# Patient Record
Sex: Female | Born: 1955
Health system: Southern US, Community
[De-identification: ages and names within clinical notes are randomized; demographics above are authoritative.]

## PROBLEM LIST (undated history)

## (undated) DIAGNOSIS — I509 Heart failure, unspecified: Secondary | ICD-10-CM

## (undated) DIAGNOSIS — E669 Obesity, unspecified: Secondary | ICD-10-CM

## (undated) DIAGNOSIS — F329 Major depressive disorder, single episode, unspecified: Secondary | ICD-10-CM

## (undated) DIAGNOSIS — R6520 Severe sepsis without septic shock: Secondary | ICD-10-CM

## (undated) DIAGNOSIS — K5731 Diverticulosis of large intestine without perforation or abscess with bleeding: Secondary | ICD-10-CM

## (undated) DIAGNOSIS — A419 Sepsis, unspecified organism: Secondary | ICD-10-CM

## (undated) DIAGNOSIS — C50919 Malignant neoplasm of unspecified site of unspecified female breast: Secondary | ICD-10-CM

## (undated) DIAGNOSIS — J449 Chronic obstructive pulmonary disease, unspecified: Secondary | ICD-10-CM

## (undated) DIAGNOSIS — Z9889 Other specified postprocedural states: Secondary | ICD-10-CM

## (undated) DIAGNOSIS — J45909 Unspecified asthma, uncomplicated: Secondary | ICD-10-CM

## (undated) DIAGNOSIS — E119 Type 2 diabetes mellitus without complications: Secondary | ICD-10-CM

## (undated) DIAGNOSIS — I10 Essential (primary) hypertension: Secondary | ICD-10-CM

## (undated) DIAGNOSIS — G4730 Sleep apnea, unspecified: Secondary | ICD-10-CM

## (undated) DIAGNOSIS — K5792 Diverticulitis of intestine, part unspecified, without perforation or abscess without bleeding: Secondary | ICD-10-CM

## (undated) DIAGNOSIS — B399 Histoplasmosis, unspecified: Secondary | ICD-10-CM

## (undated) DIAGNOSIS — E875 Hyperkalemia: Secondary | ICD-10-CM

## (undated) DIAGNOSIS — Z720 Tobacco use: Secondary | ICD-10-CM

## (undated) DIAGNOSIS — R112 Nausea with vomiting, unspecified: Secondary | ICD-10-CM

## (undated) DIAGNOSIS — F32A Depression, unspecified: Secondary | ICD-10-CM

## (undated) DIAGNOSIS — J9601 Acute respiratory failure with hypoxia: Secondary | ICD-10-CM

## (undated) DIAGNOSIS — K922 Gastrointestinal hemorrhage, unspecified: Secondary | ICD-10-CM

## (undated) DIAGNOSIS — R0602 Shortness of breath: Secondary | ICD-10-CM

## (undated) DIAGNOSIS — J189 Pneumonia, unspecified organism: Secondary | ICD-10-CM

## (undated) DIAGNOSIS — Z8489 Family history of other specified conditions: Secondary | ICD-10-CM

## (undated) DIAGNOSIS — D649 Anemia, unspecified: Secondary | ICD-10-CM

## (undated) DIAGNOSIS — J984 Other disorders of lung: Secondary | ICD-10-CM

## (undated) DIAGNOSIS — K429 Umbilical hernia without obstruction or gangrene: Secondary | ICD-10-CM

## (undated) HISTORY — DX: Severe sepsis without septic shock: A41.9

## (undated) HISTORY — DX: Gastrointestinal hemorrhage, unspecified: K92.2

## (undated) HISTORY — DX: Acute respiratory failure with hypoxia: J96.01

## (undated) HISTORY — DX: Diverticulitis of intestine, part unspecified, without perforation or abscess without bleeding: K57.92

## (undated) HISTORY — DX: Diverticulosis of large intestine without perforation or abscess with bleeding: K57.31

## (undated) HISTORY — PX: EYE SURGERY: SHX253

## (undated) HISTORY — DX: Heart failure, unspecified: I50.9

## (undated) HISTORY — DX: Umbilical hernia without obstruction or gangrene: K42.9

## (undated) HISTORY — DX: Unspecified asthma, uncomplicated: J45.909

## (undated) HISTORY — DX: Severe sepsis without septic shock: R65.20

---

## 2000-02-16 ENCOUNTER — Encounter: Admission: RE | Admit: 2000-02-16 | Discharge: 2000-02-16 | Payer: Self-pay | Admitting: Family Medicine

## 2000-02-16 ENCOUNTER — Encounter: Payer: Self-pay | Admitting: Family Medicine

## 2000-02-23 ENCOUNTER — Encounter: Payer: Self-pay | Admitting: Family Medicine

## 2000-02-23 ENCOUNTER — Encounter: Admission: RE | Admit: 2000-02-23 | Discharge: 2000-02-23 | Payer: Self-pay | Admitting: Family Medicine

## 2000-02-24 ENCOUNTER — Other Ambulatory Visit: Admission: RE | Admit: 2000-02-24 | Discharge: 2000-02-24 | Payer: Self-pay | Admitting: Family Medicine

## 2000-02-28 ENCOUNTER — Encounter: Payer: Self-pay | Admitting: Family Medicine

## 2000-02-28 ENCOUNTER — Encounter: Admission: RE | Admit: 2000-02-28 | Discharge: 2000-02-28 | Payer: Self-pay | Admitting: Family Medicine

## 2000-02-28 ENCOUNTER — Other Ambulatory Visit: Admission: RE | Admit: 2000-02-28 | Discharge: 2000-02-28 | Payer: Self-pay | Admitting: Family Medicine

## 2006-02-03 ENCOUNTER — Emergency Department (HOSPITAL_COMMUNITY): Admission: EM | Admit: 2006-02-03 | Discharge: 2006-02-03 | Payer: Self-pay | Admitting: Emergency Medicine

## 2010-10-04 ENCOUNTER — Emergency Department (HOSPITAL_COMMUNITY)
Admission: EM | Admit: 2010-10-04 | Discharge: 2010-10-05 | Disposition: A | Discharge status: Home / Self Care | Payer: Self-pay | Attending: Emergency Medicine | Admitting: Emergency Medicine

## 2010-10-04 DIAGNOSIS — K429 Umbilical hernia without obstruction or gangrene: Secondary | ICD-10-CM | POA: Insufficient documentation

## 2010-10-04 DIAGNOSIS — F17200 Nicotine dependence, unspecified, uncomplicated: Secondary | ICD-10-CM | POA: Insufficient documentation

## 2010-10-04 DIAGNOSIS — R1033 Periumbilical pain: Secondary | ICD-10-CM | POA: Insufficient documentation

## 2010-10-05 ENCOUNTER — Emergency Department (HOSPITAL_COMMUNITY): Payer: Self-pay

## 2010-10-05 LAB — CBC
HCT: 47.5 % — ABNORMAL HIGH (ref 36.0–46.0)
Hemoglobin: 15.9 g/dL — ABNORMAL HIGH (ref 12.0–15.0)
MCH: 30.4 pg (ref 26.0–34.0)
MCHC: 33.5 g/dL (ref 30.0–36.0)
MCV: 90.8 fL (ref 78.0–100.0)
Platelets: 256 10*3/uL (ref 150–400)
RBC: 5.23 MIL/uL — ABNORMAL HIGH (ref 3.87–5.11)
RDW: 13.7 % (ref 11.5–15.5)
WBC: 12.7 10*3/uL — ABNORMAL HIGH (ref 4.0–10.5)

## 2010-10-05 LAB — URINALYSIS, ROUTINE W REFLEX MICROSCOPIC
Bilirubin Urine: NEGATIVE
Glucose, UA: NEGATIVE mg/dL
Hgb urine dipstick: NEGATIVE
Ketones, ur: NEGATIVE mg/dL
Nitrite: NEGATIVE
Protein, ur: NEGATIVE mg/dL
Specific Gravity, Urine: 1.008 (ref 1.005–1.030)
Urobilinogen, UA: 0.2 mg/dL (ref 0.0–1.0)
pH: 6 (ref 5.0–8.0)

## 2010-10-05 LAB — COMPREHENSIVE METABOLIC PANEL
ALT: 24 U/L (ref 0–35)
AST: 16 U/L (ref 0–37)
Albumin: 3.2 g/dL — ABNORMAL LOW (ref 3.5–5.2)
Alkaline Phosphatase: 67 U/L (ref 39–117)
BUN: 7 mg/dL (ref 6–23)
CO2: 29 mEq/L (ref 19–32)
Calcium: 9 mg/dL (ref 8.4–10.5)
Chloride: 105 mEq/L (ref 96–112)
Creatinine, Ser: 0.71 mg/dL (ref 0.4–1.2)
GFR calc Af Amer: >60 mL/min (ref >60)
GFR calc non Af Amer: >60 mL/min (ref >60)
Glucose, Bld: 104 mg/dL — ABNORMAL HIGH (ref 70–99)
Potassium: 4 mEq/L (ref 3.5–5.1)
Sodium: 142 mEq/L (ref 135–145)
Total Bilirubin: 0.3 mg/dL (ref 0.3–1.2)
Total Protein: 7.3 g/dL (ref 6.0–8.3)

## 2010-10-05 LAB — DIFFERENTIAL
Basophils Absolute: 0.1 10*3/uL (ref 0.0–0.1)
Basophils Relative: 1 % (ref 0–1)
Eosinophils Absolute: 0.2 10*3/uL (ref 0.0–0.7)
Eosinophils Relative: 1 % (ref 0–5)
Lymphocytes Relative: 29 % (ref 12–46)
Lymphs Abs: 3.7 10*3/uL (ref 0.7–4.0)
Monocytes Absolute: 0.7 10*3/uL (ref 0.1–1.0)
Monocytes Relative: 5 % (ref 3–12)
Neutro Abs: 8.1 10*3/uL — ABNORMAL HIGH (ref 1.7–7.7)
Neutrophils Relative %: 64 % (ref 43–77)

## 2010-10-05 MED ORDER — IOHEXOL 300 MG/ML  SOLN: 100.0000 mL | Freq: Once | INTRAMUSCULAR | Status: DC | PRN

## 2012-12-04 ENCOUNTER — Encounter: Payer: Self-pay | Admitting: Family Medicine

## 2012-12-04 ENCOUNTER — Other Ambulatory Visit: Payer: Self-pay | Admitting: Family Medicine

## 2012-12-04 ENCOUNTER — Ambulatory Visit (INDEPENDENT_AMBULATORY_CARE_PROVIDER_SITE_OTHER): Payer: BC Managed Care – PPO | Admitting: Family Medicine

## 2012-12-04 VITALS — BP 140/92 | HR 114 | Temp 98.0°F | Ht 62.75 in | Wt 227.0 lb

## 2012-12-04 DIAGNOSIS — Z720 Tobacco use: Secondary | ICD-10-CM

## 2012-12-04 DIAGNOSIS — Z136 Encounter for screening for cardiovascular disorders: Secondary | ICD-10-CM

## 2012-12-04 DIAGNOSIS — Z0000 Encounter for general adult medical examination without abnormal findings: Secondary | ICD-10-CM

## 2012-12-04 DIAGNOSIS — K429 Umbilical hernia without obstruction or gangrene: Secondary | ICD-10-CM

## 2012-12-04 NOTE — Progress Notes (Signed)
  Subjective:    Patient ID: Deborah [name], female    DOB: [birth year], 57 y.o.   MRN: [id number]  HPI  Very pleasant 57 yo female with long history of tobacco abuse here to establish care and to discuss hernia.  Umbilical hernia- very large, first noticed it 4 years ago and has been growing in size.  Did not have insurance until recently.  Has not had any preventative care since 1999. Recently, she feels her hernia "burning" when she coughs.  Burning resolves shortly afterwards.  No constant pain.  Patient Active Problem List   Diagnosis Date Noted  . Umbilical hernia    Past Medical History  Diagnosis Date  . Umbilical hernia    No past surgical history on file. History  Substance Use Topics  . Smoking status: Current Every Day Smoker  . Smokeless tobacco: Not on file  . Alcohol Use: Not on file   No family history on file. No Known Allergies No current outpatient prescriptions on file prior to visit.   No current facility-administered medications on file prior to visit.   The PMH, PSH, Social History, Family History, Medications, and allergies have been reviewed in [facility], and have been updated if relevant.   Review of Systems See HPI No fever No nausea, vomiting or changes in bowel habits    Objective:   Physical Exam  Constitutional: She appears well-developed and well-nourished. No distress.  HENT:  Mouth/Throat: Abnormal dentition.  Abdominal: Bowel sounds are normal. There is no tenderness.    Neurological: She is alert.  Psychiatric: She has a normal mood and affect. Her speech is normal and behavior is normal. Judgment and thought content normal. Cognition and memory are normal.   BP 140/92  Pulse 114  Temp(Src) 98 F (36.7 C)  Ht 5' 2.75" (1.594 m)  Wt 227 lb (102.967 kg)  BMI 40.52 kg/m2  SpO2 91%        Assessment & Plan:  1. Umbilical/ventral hernia Growing in size and now symptomatic.  Will refer to general surgery for further  evaluation. The patient indicates understanding of these issues and agrees with the plan.  - Ambulatory referral to General Surgery

## 2012-12-04 NOTE — Patient Instructions (Signed)
At your convenience, please schedule an appointment for CPX.  Please stop by to see Bonita Quin on your way out to set up your surgical referral.

## 2012-12-06 ENCOUNTER — Encounter (INDEPENDENT_AMBULATORY_CARE_PROVIDER_SITE_OTHER): Payer: Self-pay | Admitting: General Surgery

## 2012-12-06 ENCOUNTER — Telehealth: Payer: Self-pay

## 2012-12-06 ENCOUNTER — Ambulatory Visit (INDEPENDENT_AMBULATORY_CARE_PROVIDER_SITE_OTHER): Payer: BC Managed Care – PPO | Admitting: General Surgery

## 2012-12-06 VITALS — BP 160/98 | HR 102 | Temp 97.6°F | Ht 62.0 in | Wt 226.0 lb

## 2012-12-06 DIAGNOSIS — K439 Ventral hernia without obstruction or gangrene: Secondary | ICD-10-CM

## 2012-12-06 MED ORDER — VARENICLINE TARTRATE 0.5 MG X 11 & 1 MG X 42 PO MISC: ORAL | Status: DC

## 2012-12-06 NOTE — Telephone Encounter (Signed)
Rx sent for chantix.

## 2012-12-06 NOTE — Telephone Encounter (Signed)
Pt left v/m; pt saw Dr Biagio Quint and pt is to get med to help pt stop smoking before having surgery.[facility] [address]. Pt said OK to get rx sent to pharmacy next week.

## 2012-12-06 NOTE — Progress Notes (Signed)
Patient ID: Deborah [name], female   DOB: Apr 29, 1956, 57 y.o.   MRN: [id number]  Chief Complaint  Patient presents with  . New Evaluation    eval hernia    HPI Deborah [name] is a 57 y.o. female. This patient is referred by Dr. Dayton Martes for evaluation of of a possible umbilical hernia repair.  She says that she has noticed this bulge for at least 4 years but she did not have insurance and he really has not been uncomfortable for her except for old and occasional episodes. It does not reduce.  She says that over the last few months she's having more discomfort in the area of him becoming more constant. She describes this as a "burning and pulling and cramping pain" which only lasted a few minutes and then spontaneously resolved. She denies any obstructive symptoms and says that her bowels are normal although she does get occasional bloating but no vomiting. She has never had a colonoscopy. She is a heavy smoker and was smoking 2 packs a day of until recently over the last 18 months she is headed down to about one pack per day. She does have shortness of breath with regular daily activities. She denies any heart problems but really hasn't had any previous evaluation as well. HPI  Past Medical History  Diagnosis Date  . Umbilical hernia   . Asthma     Past Surgical History  Procedure Laterality Date  . Cesarean section  04/12/85    Family History  Problem Relation Age of Onset  . Heart attack Mother     Social History History  Substance Use Topics  . Smoking status: Current Every Day Smoker -- 1.00 packs/day    Types: Cigarettes  . Smokeless tobacco: Not on file  . Alcohol Use: Yes     Comment: 1 month    No Known Allergies  Current Outpatient Prescriptions  Medication Sig Dispense Refill  . Aspirin-Caffeine (BC FAST PAIN RELIEF ARTHRITIS PO) Take 4 by mouth daily      . cetirizine (ZYRTEC) 10 MG tablet Take 10 mg by mouth daily.      . Olopatadine HCl (PATADAY) 0.2 % SOLN One  drop in each eye twice a day       No current facility-administered medications for this visit.    Review of Systems Review of Systems All other review of systems negative or noncontributory except as stated in the HPI  Blood pressure 160/98, pulse 102, temperature 97.6 F (36.4 C), temperature source Temporal, height 5\' 2"  (1.575 m), weight 226 lb (102.513 kg), SpO2 91.00%.  Physical Exam Physical Exam Physical Exam  Nursing note and vitals reviewed. Constitutional: She is oriented to person, place, and time. She appears well-developed and well-nourished. No distress.  HENT:  Head: Normocephalic and atraumatic.  Mouth/Throat: No oropharyngeal exudate.  Eyes: Conjunctivae and EOM are normal. Pupils are equal, round, and reactive to light. Right eye exhibits no discharge. Left eye exhibits no discharge. No scleral icterus.  Neck: Normal range of motion. Neck supple. No tracheal deviation present.  Cardiovascular: Normal rate, regular rhythm, normal heart sounds and intact distal pulses.   Pulmonary/Chest: Effort normal and breath sounds with insp and exp wheezing. No stridor. No respiratory distress.  Abdominal: Soft. Bowel sounds are normal. She exhibits no distension and no mass. There is no tenderness. There is no rebound and no guarding. She has a large, irreducible umbilical hernia which is nontender.  Musculoskeletal: Normal range of motion. She  exhibits no edema and no tenderness.  Neurological: She is alert and oriented to person, place, and time.  Skin: Skin is warm and dry. No rash noted. She is not diaphoretic. No erythema. No pallor.  Psychiatric: She has a normal mood and affect. Her behavior is normal. Judgment and thought content normal.    Data Reviewed CT from 2012  Assessment    Large, incarcerated umbilical hernia She has a fairly large an incarcerated umbilical hernia which at least on CT scan from 2012 contains fat. She's having some mild discomfort from this  hernia but no obstructive symptoms. I think that she would benefit from repair of his hernia and I discussed with her the options of continued observation versus laparoscopic versus open ventral hernia repair with mesh. I think that probably an open repair would be best for her given the size of this defect, however, given her obesity and heavy smoking I cannot really recommend this at this time. I explained that her heavy smoking and obesity are probably the greatest risk factors for complications perioperatively not including her baseline pulmonary issues which would increase her risk for pulmonary complications. We also discussed laparoscopic repair which might be lower risk from a wound infection in competition from the hernia standpoint, however, not sure that we would be able to completely reduce this bulge and she would have persistent bulging likely in the area of and she was not interested in having any persistent bulging. For the difficult situation because she certainly needs ventral hernia repair, but at this time, I think surgery is a very high-risk for her. I recommended smoking cessation and explained that if she was able to be completely smoke-free for one month, that I would be willing to fix this hernia for her. She is going to discussed this with her primary physician and discuss medication options to help with her smoking cessation and she will contact me in  another month or 2 and the me know how she is doing with he smoking cessation.    Plan    Weight loss and smoking cessation and she will followup in about 2 months. She would also need cardiac clearance and clearance from her primary physician prior to surgery.       Lodema Pilot DAVID 12/06/2012, 10:35 AM

## 2012-12-06 NOTE — Telephone Encounter (Signed)
Medicine called to [facility], left message on voice mail advising patient.

## 2012-12-13 ENCOUNTER — Other Ambulatory Visit (INDEPENDENT_AMBULATORY_CARE_PROVIDER_SITE_OTHER): Payer: BC Managed Care – PPO

## 2012-12-13 DIAGNOSIS — Z0000 Encounter for general adult medical examination without abnormal findings: Secondary | ICD-10-CM

## 2012-12-13 DIAGNOSIS — Z136 Encounter for screening for cardiovascular disorders: Secondary | ICD-10-CM

## 2012-12-13 LAB — COMPREHENSIVE METABOLIC PANEL
ALT: 21 U/L (ref 0–35)
AST: 17 U/L (ref 0–37)
Albumin: 3.4 g/dL — ABNORMAL LOW (ref 3.5–5.2)
Alkaline Phosphatase: 65 U/L (ref 39–117)
BUN: 15 mg/dL (ref 6–23)
CO2: 30 mEq/L (ref 19–32)
Calcium: 9.1 mg/dL (ref 8.4–10.5)
Chloride: 105 mEq/L (ref 96–112)
Creatinine, Ser: 0.8 mg/dL (ref 0.4–1.2)
GFR: 79.28 mL/min (ref >60.00)
Glucose, Bld: 101 mg/dL — ABNORMAL HIGH (ref 70–99)
Potassium: 4.3 mEq/L (ref 3.5–5.1)
Sodium: 142 mEq/L (ref 135–145)
Total Bilirubin: 0.3 mg/dL (ref 0.3–1.2)
Total Protein: 7.6 g/dL (ref 6.0–8.3)

## 2012-12-13 LAB — LIPID PANEL
Cholesterol: 185 mg/dL (ref 0–200)
HDL: 34.7 mg/dL — ABNORMAL LOW (ref >39.00)
LDL Cholesterol: 120 mg/dL — ABNORMAL HIGH (ref 0–99)
Total CHOL/HDL Ratio: 5
Triglycerides: 152 mg/dL — ABNORMAL HIGH (ref 0.0–149.0)
VLDL: 30.4 mg/dL (ref 0.0–40.0)

## 2012-12-19 ENCOUNTER — Ambulatory Visit (INDEPENDENT_AMBULATORY_CARE_PROVIDER_SITE_OTHER): Payer: BC Managed Care – PPO | Admitting: Family Medicine

## 2012-12-19 ENCOUNTER — Other Ambulatory Visit (HOSPITAL_COMMUNITY)
Admission: RE | Admit: 2012-12-19 | Discharge: 2012-12-19 | Disposition: A | Discharge status: Home / Self Care | Payer: BC Managed Care – PPO | Source: Ambulatory Visit | Attending: Family Medicine | Admitting: Family Medicine

## 2012-12-19 ENCOUNTER — Encounter: Payer: Self-pay | Admitting: Family Medicine

## 2012-12-19 VITALS — BP 180/108 | HR 84 | Temp 98.1°F | Ht 62.0 in | Wt 228.0 lb

## 2012-12-19 DIAGNOSIS — F17200 Nicotine dependence, unspecified, uncomplicated: Secondary | ICD-10-CM

## 2012-12-19 DIAGNOSIS — Z0000 Encounter for general adult medical examination without abnormal findings: Secondary | ICD-10-CM | POA: Insufficient documentation

## 2012-12-19 DIAGNOSIS — Z720 Tobacco use: Secondary | ICD-10-CM

## 2012-12-19 DIAGNOSIS — Z01419 Encounter for gynecological examination (general) (routine) without abnormal findings: Secondary | ICD-10-CM | POA: Insufficient documentation

## 2012-12-19 DIAGNOSIS — Z1211 Encounter for screening for malignant neoplasm of colon: Secondary | ICD-10-CM

## 2012-12-19 DIAGNOSIS — Z1151 Encounter for screening for human papillomavirus (HPV): Secondary | ICD-10-CM | POA: Insufficient documentation

## 2012-12-19 DIAGNOSIS — Z1231 Encounter for screening mammogram for malignant neoplasm of breast: Secondary | ICD-10-CM

## 2012-12-19 NOTE — Addendum Note (Signed)
Addended by: Eliezer Bottom on: 12/19/2012 12:37 PM   Modules accepted: Orders

## 2012-12-19 NOTE — Progress Notes (Signed)
Subjective:    Patient ID: Deborah [name], female    DOB: Dec 07, 1955, 57 y.o.   MRN: [id number]  HPI  Very pleasant 57 yo female with long history of tobacco abuse who established care with me last month here for CPX.  At her initial visit, she was most concerned about her very large umbilical hernia.  Umbilical hernia- very large, first noticed it 4 years ago and has been growing in size.  Did not have insurance until recently.  Has not had any preventative care since 1999. Recently, she feels her hernia "burning" when she coughs.  Burning resolves shortly afterwards.  No constant pain.  Referred to Gen Surgery.  Note reviewed.  She saw Dr. Biagio Quint who recommended smoking cessation and weight loss prior to surgical repair. She will follow up with him in 2 months.  Tobacco abuse- has not smoked in 3 days which is the longest she has gone!!!  Lab Results  Component Value Date   CHOL 185 12/13/2012   HDL 34.70* 12/13/2012   LDLCALC 120* 12/13/2012   TRIG 152.0* 12/13/2012   CHOLHDL 5 12/13/2012   Lab Results  Component Value Date   NA 142 12/13/2012   K 4.3 12/13/2012   CL 105 12/13/2012   CO2 30 12/13/2012     Patient Active Problem List   Diagnosis Date Noted  . Routine general medical examination at a health care facility 12/19/2012  . Tobacco abuse 12/04/2012  . Umbilical hernia    Past Medical History  Diagnosis Date  . Umbilical hernia   . Asthma    Past Surgical History  Procedure Laterality Date  . Cesarean section  04/12/85   History  Substance Use Topics  . Smoking status: Current Every Day Smoker -- 1.00 packs/day    Types: Cigarettes  . Smokeless tobacco: Not on file  . Alcohol Use: Yes     Comment: 1 month   Family History  Problem Relation Age of Onset  . Heart attack Mother    No Known Allergies Current Outpatient Prescriptions on File Prior to Visit  Medication Sig Dispense Refill  . Aspirin-Caffeine (BC FAST PAIN RELIEF ARTHRITIS PO) Take 4 by  mouth daily      . cetirizine (ZYRTEC) 10 MG tablet Take 10 mg by mouth daily.      . Olopatadine HCl (PATADAY) 0.2 % SOLN One drop in each eye twice a day      . varenicline (CHANTIX STARTING MONTH PAK) 0.5 MG X 11 & 1 MG X 42 tablet Take one 0.5 mg tablet by mouth once daily for 3 days, then increase to one 0.5 mg tablet twice daily for 4 days, then increase to one 1 mg tablet twice daily.  53 tablet  0   No current facility-administered medications on file prior to visit.   The PMH, PSH, Social History, Family History, Medications, and allergies have been reviewed in [facility], and have been updated if relevant.   Review of Systems See HPI Patient reports no  vision/ hearing changes,anorexia, weight change, fever ,adenopathy, persistant / recurrent hoarseness, swallowing issues, chest pain, edema,persistant / recurrent cough, hemoptysis, dyspnea(rest, exertional, paroxysmal nocturnal), gastrointestinal  bleeding (melena, rectal bleeding), abdominal pain, excessive heart burn, GU symptoms(dysuria, hematuria, pyuria, voiding/incontinence  Issues) syncope, focal weakness, severe memory loss, concerning skin lesions, depression, anxiety, abnormal bruising/bleeding, major joint swelling, breast masses or abnormal vaginal bleeding.    Objective:   Physical Exam  BP 180/108  Pulse 84  Temp(Src) 98.1  F (36.7 C)  Ht 5\' 2"  (1.575 m)  Wt 228 lb (103.42 kg)  BMI 41.69 kg/m2   General:  Well-developed,well-nourished,in no acute distress; alert,appropriate and cooperative throughout examination Head:  normocephalic and atraumatic.   Eyes:  vision grossly intact, pupils equal, pupils round, and pupils reactive to light.   Ears:  R ear normal and L ear normal.   Nose:  no external deformity.   Mouth:  good dentition.   Neck:  No deformities, masses, or tenderness noted. Breasts:  No mass, nodules, thickening, tenderness, bulging, retraction, inflamation, nipple discharge or skin changes noted.    Mouth/Throat: Abnormal dentition.  Abdominal: Bowel sounds are normal. There is no tenderness.  Large umbilical hernia Rectal:  no external abnormalities.   Genitalia:  Pelvic Exam:        External: normal female genitalia without lesions or masses        Vagina: normal without lesions or masses        Cervix: normal without lesions or masses        Adnexa: normal bimanual exam without masses or fullness        Uterus: normal by palpation        Pap smear: performed Msk:  No deformity or scoliosis noted of thoracic or lumbar spine.   Extremities:  No clubbing, cyanosis, edema, or deformity noted with normal full range of motion of all joints.   Neurologic:  alert & oriented X3 and gait normal.   Skin:  Intact without suspicious lesions or rashes Cervical Nodes:  No lymphadenopathy noted Axillary Nodes:  No palpable lymphadenopathy Psych:  Cognition and judgment appear intact. Alert and cooperative with normal attention span and concentration. No apparent delusions, illusions, hallucinations     Assessment & Plan:   1. Routine general medical examination at a health care facility Reviewed preventive care protocols, scheduled due services, and updated immunizations Discussed nutrition, exercise, diet, and healthy lifestyle.   2. Tobacco abuse Quit smoking 3 days ago!  3. Other screening mammogram  - MM Digital Screening; Future  4. Special screening for malignant neoplasms, colon  - Fecal occult blood, imunochemical; Future

## 2012-12-19 NOTE — Patient Instructions (Addendum)
It was great to see you. Please stop by the lab to get your stool cards.  Please stop by to see Shirlee Limerick on your way out to set up your referral for your mammogram.

## 2013-01-02 ENCOUNTER — Telehealth: Payer: Self-pay

## 2013-01-02 NOTE — Telephone Encounter (Signed)
pt left v/m has completed chantix starter month and request another rx of chantix to [facility] and [address].Please advise.

## 2013-01-03 MED ORDER — VARENICLINE TARTRATE 1 MG PO TABS: 1.0000 mg | ORAL_TABLET | Freq: Two times a day (BID) | ORAL | Status: DC

## 2013-01-03 NOTE — Telephone Encounter (Signed)
Rx sent 

## 2013-02-07 ENCOUNTER — Encounter (INDEPENDENT_AMBULATORY_CARE_PROVIDER_SITE_OTHER): Payer: Self-pay | Admitting: General Surgery

## 2013-02-07 ENCOUNTER — Telehealth (INDEPENDENT_AMBULATORY_CARE_PROVIDER_SITE_OTHER): Payer: Self-pay | Admitting: *Deleted

## 2013-02-07 ENCOUNTER — Encounter (INDEPENDENT_AMBULATORY_CARE_PROVIDER_SITE_OTHER): Payer: Self-pay

## 2013-02-07 ENCOUNTER — Ambulatory Visit (INDEPENDENT_AMBULATORY_CARE_PROVIDER_SITE_OTHER): Payer: BC Managed Care – PPO | Admitting: General Surgery

## 2013-02-07 VITALS — BP 160/100 | HR 100 | Temp 98.9°F | Resp 17 | Ht 62.0 in | Wt 242.4 lb

## 2013-02-07 DIAGNOSIS — K439 Ventral hernia without obstruction or gangrene: Secondary | ICD-10-CM

## 2013-02-07 NOTE — Progress Notes (Signed)
Patient ID: Deborah [name], female   DOB: 16-Aug-1955, 57 y.o.   MRN: [id number]  Chief Complaint  Patient presents with  . Follow-up    reck hernia    HPI Deborah [name] is a 57 y.o. female. This patient returns today for evaluation of her umbilical and ventral hernia which has been present for 4-5 years. She says it is symptomatic and does cause discomfort and prevents her from wearing button up jeans.  She did not have any obstructive symptoms but the bulge does not reduce. She says it really hasn't changed much since our last visit but she has stopped smoking in the last time she smoked was on June 3. She is on Chantix and says that she has not even smoked one cigarette since then.  No chest pain, no cough.  She just had her routine physical with her physician. HPI  Past Medical History  Diagnosis Date  . Umbilical hernia   . Asthma     Past Surgical History  Procedure Laterality Date  . Cesarean section  04/12/85    Family History  Problem Relation Age of Onset  . Heart attack Mother     Social History History  Substance Use Topics  . Smoking status: Current Every Day Smoker -- 1.00 packs/day    Types: Cigarettes  . Smokeless tobacco: Not on file     Comment: No cigs since 12/17/12  . Alcohol Use: Yes     Comment: 1 month    No Known Allergies  Current Outpatient Prescriptions  Medication Sig Dispense Refill  . Aspirin-Caffeine (BC FAST PAIN RELIEF ARTHRITIS PO) Take 4 by mouth daily      . cetirizine (ZYRTEC) 10 MG tablet Take 10 mg by mouth daily.      . Olopatadine HCl (PATADAY) 0.2 % SOLN One drop in each eye twice a day      . varenicline (CHANTIX CONTINUING MONTH PAK) 1 MG tablet Take 1 tablet (1 mg total) by mouth 2 (two) times daily.  60 tablet  3   No current facility-administered medications for this visit.    Review of Systems Review of Systems All other review of systems negative or noncontributory except as stated in the HPI  Blood pressure  160/100, pulse 100, temperature 98.9 F (37.2 C), temperature source Temporal, resp. rate 17, height 5\' 2"  (1.575 m), weight 242 lb 6.4 oz (109.952 kg).  Physical Exam Physical Exam Physical Exam  Nursing note and vitals reviewed. Constitutional: She is oriented to person, place, and time. She appears well-developed and well-nourished. No distress.  HENT:  Head: Normocephalic and atraumatic.  Mouth/Throat: No oropharyngeal exudate.  Eyes: Conjunctivae and EOM are normal. Pupils are equal, round, and reactive to light. Right eye exhibits no discharge. Left eye exhibits no discharge. No scleral icterus.  Neck: Normal range of motion. Neck supple. No tracheal deviation present.  Cardiovascular: Normal rate, regular rhythm, normal heart sounds and intact distal pulses.   Pulmonary/Chest: Effort normal and breath sounds normal. No stridor. No respiratory distress. She has no wheezes.  Abdominal: Soft. Bowel sounds are normal. She exhibits no distension. There is no tenderness. There is no rebound and no guarding.  She does have a sizable umbilical bulge consistent with her known ventral hernia. This is not reducible and is somewhat tender on exam today. There is no sign of strangulation and no skin changes. Musculoskeletal: Normal range of motion. She exhibits no edema and no tenderness.  Neurological: She is alert  and oriented to person, place, and time.  Skin: Skin is warm and dry. No rash noted. She is not diaphoretic. No erythema. No pallor.  Psychiatric: She has a normal mood and affect. Her behavior is normal. Judgment and thought content normal.    Data Reviewed CT  Assessment    Umbilical and ventral hernia-incarcerated She returns today and continues to desire ventral hernia repair. She says that she has not smoked since June 3. I discussed with her the options of open and laparoscopic repair of we discussed the procedures and the risks and benefits of each. I explained that  laparoscopic repair may be best for her given her obesity and history of smoking to minimize wound infection and wound complications but she may have persistent seroma or bulging at her umbilicus and she is not interested in this. She would like to have open ventral hernia repair since this is more likely to restore her abdominal wall. I explained to her the procedure and its risks including infection and wound complications, bleeding, pain, scarring, recurrence, bowel injury, chronic pain, possible need for component separation, and poor cosmesis and she expressed understanding and would like to proceed with open ventral hernia repair with mesh. I explained that she will need to continue to not smoke and if she smoked any cigarettes between now and her procedure then we would cancel her procedures.    Plan    Continue with smoking cessation Plan for open ventral hernia repair with mesh We will send a note to her primary physician requesting clearance for surgery and if no further workup is needed we will go ahead and plan for her procedure       Deborah [name] Deborah [name] 02/07/2013, 9:27 AM

## 2013-02-07 NOTE — Telephone Encounter (Signed)
Dr. Elmer Sow office called to state they received the request for surgical clearance however they will require an office visit for the patient.  They will call and schedule patient to come in to see them and they will let us know about clearance.

## 2013-02-11 ENCOUNTER — Ambulatory Visit (INDEPENDENT_AMBULATORY_CARE_PROVIDER_SITE_OTHER): Payer: BC Managed Care – PPO | Admitting: Family Medicine

## 2013-02-11 ENCOUNTER — Encounter: Payer: Self-pay | Admitting: Family Medicine

## 2013-02-11 VITALS — BP 144/98 | HR 100 | Temp 98.0°F | Ht 62.0 in | Wt 244.0 lb

## 2013-02-11 DIAGNOSIS — Z01818 Encounter for other preprocedural examination: Secondary | ICD-10-CM

## 2013-02-11 NOTE — Progress Notes (Signed)
Subjective:    Deborah [name] is a 57 y.o. female who presents to the office today for a preoperative consultation at the request of surgeon Dr. Biagio Quint who plans on performing ventral hernia repair. Planned anesthesia: general. The patient has the following known anesthesia issues: none Patients bleeding risk: no recent abnormal bleeding. Patient does not have objections to receiving blood products if needed.  Quit smoking on June 3rd.  Patient Active Problem List   Diagnosis Date Noted  . Pre-op evaluation 02/11/2013  . Routine general medical examination at a health care facility 12/19/2012  . Tobacco abuse 12/04/2012  . Umbilical hernia    Past Medical History  Diagnosis Date  . Umbilical hernia   . Asthma    Past Surgical History  Procedure Laterality Date  . Cesarean section  04/12/85   History  Substance Use Topics  . Smoking status: Former Smoker -- 1.00 packs/day    Types: Cigarettes  . Smokeless tobacco: Not on file     Comment: No cigs since 12/17/12  . Alcohol Use: Yes     Comment: 1 month   Family History  Problem Relation Age of Onset  . Heart attack Mother    No Known Allergies Current Outpatient Prescriptions on File Prior to Visit  Medication Sig Dispense Refill  . Aspirin-Caffeine (BC FAST PAIN RELIEF ARTHRITIS PO) Take 4 by mouth daily      . cetirizine (ZYRTEC) 10 MG tablet Take 10 mg by mouth daily.      . Olopatadine HCl (PATADAY) 0.2 % SOLN One drop in each eye twice a day      . varenicline (CHANTIX CONTINUING MONTH PAK) 1 MG tablet Take 1 tablet (1 mg total) by mouth 2 (two) times daily.  60 tablet  3   No current facility-administered medications on file prior to visit.   The PMH, PSH, Social History, Family History, Medications, and allergies have been reviewed in [facility], and have been updated if relevant.   Review of Systems See HPI   Objective:   BP 144/98  Pulse 100  Temp(Src) 98 F (36.7 C)  Ht 5\' 2"  (1.575 m)  Wt 244 lb (110.678  kg)  BMI 44.62 kg/m2  Constitutional: She is oriented to person, place, and time. She appears well-developed and well-nourished. No distress.  HENT:  Head: Normocephalic and atraumatic.  Mouth/Throat: No oropharyngeal exudate.  Eyes: Conjunctivae and EOM are normal. Pupils are equal, round, and reactive to light. Right eye exhibits no discharge. Left eye exhibits no discharge. No scleral icterus.  Neck: Normal range of motion. Neck supple. No tracheal deviation present.  Cardiovascular: Normal rate, regular rhythm, normal heart sounds and intact distal pulses.  Pulmonary/Chest: Effort normal and breath sounds normal. No stridor. No respiratory distress. She has no wheezes.  Abdominal: Soft. Bowel sounds are normal. She exhibits no distension. There is no tenderness. There is no rebound and no guarding. She does have a large umbilical ventral hernia.  Musculoskeletal: Normal range of motion. She exhibits no edema and no tenderness.  Neurological: She is alert and oriented to person, place, and time.  Skin: Skin is warm and dry. No rash noted. She is not diaphoretic. No erythema. No pallor.  Psychiatric: She has a normal mood and affect. Her behavior is normal. Judgment and thought content normal.    Cardiographics ECG: normal sinus rhythm, no blocks or conduction defects, no ischemic changes   Lab Review  Lab on 12/13/2012  Component Date Value  .  Sodium 12/13/2012 142   . Potassium 12/13/2012 4.3   . Chloride 12/13/2012 105   . CO2 12/13/2012 30   . Glucose, Bld 12/13/2012 101*  . BUN 12/13/2012 15   . Creatinine, Ser 12/13/2012 0.8   . Total Bilirubin 12/13/2012 0.3   . Alkaline Phosphatase 12/13/2012 65   . AST 12/13/2012 17   . ALT 12/13/2012 21   . Total Protein 12/13/2012 7.6   . Albumin 12/13/2012 3.4*  . Calcium 12/13/2012 9.1   . GFR 12/13/2012 79.28   . Cholesterol 12/13/2012 185   . Triglycerides 12/13/2012 152.0*  . HDL 12/13/2012 34.70*  . VLDL 12/13/2012 30.4    . LDL Cholesterol 12/13/2012 120*  . Total CHOL/HDL Ratio 12/13/2012 5       Assessment:      57 y.o. female with planned surgery as above.   Known risk factors for perioperative complications: None     Cardiac Risk Estimation: low     Plan:    1. Preoperative workup as follows ECG. 2. Change in medication regimen before surgery: discontinue NSAIDs (BC powder) 14 days before surgery.  I have advised her to try tylenol as BC powder could could gastritis, etc.

## 2013-02-14 ENCOUNTER — Telehealth (INDEPENDENT_AMBULATORY_CARE_PROVIDER_SITE_OTHER): Payer: Self-pay | Admitting: General Surgery

## 2013-02-14 NOTE — Telephone Encounter (Signed)
Deborah [name],  We are waiting to hear back from Dr. Elmer Sow office.  Per Dr. Dayton Martes patient need's appointment before they will sign off on her cardiac clearance. Patient is aware of this as well.  Maryan Puls, CMA

## 2013-02-14 NOTE — Telephone Encounter (Signed)
Pt wants her surgery scheduled

## 2013-02-20 ENCOUNTER — Other Ambulatory Visit (INDEPENDENT_AMBULATORY_CARE_PROVIDER_SITE_OTHER): Payer: Self-pay | Admitting: General Surgery

## 2013-02-20 DIAGNOSIS — K439 Ventral hernia without obstruction or gangrene: Secondary | ICD-10-CM

## 2013-02-21 NOTE — Telephone Encounter (Signed)
Pre-Op clearance rec'd from Dr. Dayton Martes.  Written orders given to Debbie in OR scheduling on 02/21/13/Tylyn Stankovich

## 2013-03-06 ENCOUNTER — Encounter (HOSPITAL_COMMUNITY): Payer: Self-pay | Admitting: Pharmacy Technician

## 2013-03-12 ENCOUNTER — Encounter (HOSPITAL_COMMUNITY): Payer: Self-pay

## 2013-03-12 ENCOUNTER — Ambulatory Visit (HOSPITAL_COMMUNITY)
Admission: RE | Admit: 2013-03-12 | Discharge: 2013-03-12 | Disposition: A | Discharge status: Home / Self Care | Payer: BC Managed Care – PPO | Source: Ambulatory Visit | Attending: General Surgery | Admitting: General Surgery

## 2013-03-12 ENCOUNTER — Encounter (HOSPITAL_COMMUNITY)
Admission: RE | Admit: 2013-03-12 | Discharge: 2013-03-12 | Disposition: A | Discharge status: Home / Self Care | Payer: BC Managed Care – PPO | Source: Ambulatory Visit | Attending: General Surgery | Admitting: General Surgery

## 2013-03-12 VITALS — BP 159/103 | HR 96 | Temp 98.0°F | Resp 20 | Ht 62.0 in | Wt 246.0 lb

## 2013-03-12 DIAGNOSIS — I700 Atherosclerosis of aorta: Secondary | ICD-10-CM | POA: Insufficient documentation

## 2013-03-12 DIAGNOSIS — K439 Ventral hernia without obstruction or gangrene: Secondary | ICD-10-CM | POA: Insufficient documentation

## 2013-03-12 DIAGNOSIS — Z01818 Encounter for other preprocedural examination: Secondary | ICD-10-CM | POA: Insufficient documentation

## 2013-03-12 DIAGNOSIS — Z01812 Encounter for preprocedural laboratory examination: Secondary | ICD-10-CM | POA: Insufficient documentation

## 2013-03-12 HISTORY — DX: Nausea with vomiting, unspecified: R11.2

## 2013-03-12 HISTORY — DX: Shortness of breath: R06.02

## 2013-03-12 HISTORY — DX: Other specified postprocedural states: Z98.890

## 2013-03-12 HISTORY — DX: Family history of other specified conditions: Z84.89

## 2013-03-12 HISTORY — DX: Pneumonia, unspecified organism: J18.9

## 2013-03-12 HISTORY — DX: Anemia, unspecified: D64.9

## 2013-03-12 HISTORY — DX: Histoplasmosis, unspecified: B39.9

## 2013-03-12 HISTORY — DX: Major depressive disorder, single episode, unspecified: F32.9

## 2013-03-12 HISTORY — DX: Essential (primary) hypertension: I10

## 2013-03-12 HISTORY — DX: Depression, unspecified: F32.A

## 2013-03-12 LAB — BASIC METABOLIC PANEL
BUN: 15 mg/dL (ref 6–23)
CO2: 29 mEq/L (ref 19–32)
Calcium: 9.3 mg/dL (ref 8.4–10.5)
Chloride: 104 mEq/L (ref 96–112)
Creatinine, Ser: 0.79 mg/dL (ref 0.50–1.10)
GFR calc Af Amer: >90 mL/min (ref >90)
GFR calc non Af Amer: >90 mL/min (ref >90)
Glucose, Bld: 113 mg/dL — ABNORMAL HIGH (ref 70–99)
Potassium: 3.8 mEq/L (ref 3.5–5.1)
Sodium: 140 mEq/L (ref 135–145)

## 2013-03-12 LAB — CBC WITH DIFFERENTIAL/PLATELET
Basophils Absolute: 0.1 10*3/uL (ref 0.0–0.1)
Basophils Relative: 1 % (ref 0–1)
Eosinophils Absolute: 0.2 10*3/uL (ref 0.0–0.7)
Eosinophils Relative: 2 % (ref 0–5)
HCT: 43 % (ref 36.0–46.0)
Hemoglobin: 14 g/dL (ref 12.0–15.0)
Lymphocytes Relative: 27 % (ref 12–46)
Lymphs Abs: 2.3 10*3/uL (ref 0.7–4.0)
MCH: 29.7 pg (ref 26.0–34.0)
MCHC: 32.6 g/dL (ref 30.0–36.0)
MCV: 91.1 fL (ref 78.0–100.0)
Monocytes Absolute: 0.6 10*3/uL (ref 0.1–1.0)
Monocytes Relative: 7 % (ref 3–12)
Neutro Abs: 5.3 10*3/uL (ref 1.7–7.7)
Neutrophils Relative %: 62 % (ref 43–77)
Platelets: 242 10*3/uL (ref 150–400)
RBC: 4.72 MIL/uL (ref 3.87–5.11)
RDW: 14.2 % (ref 11.5–15.5)
WBC: 8.5 10*3/uL (ref 4.0–10.5)

## 2013-03-12 NOTE — Patient Instructions (Addendum)
20 Deborah [name]  03/12/2013   Your procedure is scheduled on: 03/19/13  Report to [facility] at 10:00 AM.  Call this number if you have problems the morning of surgery [phone]: [phone]   Remember:   Do not eat food or drink liquids After Midnight.   Do not wear jewelry, make-up or nail polish.  Do not wear lotions, powders, or perfumes. You may wear deodorant.  Do not shave 48 hours prior to surgery. Men may shave face and neck.  Do not bring valuables to the hospital.  Contacts, dentures or bridgework may not be worn into surgery.  Leave suitcase in the car. After surgery it may be brought to your room.  For patients admitted to the hospital, checkout time is 11:00 AM the day of discharge.   Birdie Sons, RN  pre op nurse call if needed [phone]    FAILURE TO FOLLOW THESE INSTRUCTIONS MAY RESULT IN CANCELLATION OF YOUR SURGERY   Patient Signature: ___________________________________________

## 2013-03-12 NOTE — Progress Notes (Signed)
03/12/13 1006  OBSTRUCTIVE SLEEP APNEA  Have you ever been diagnosed with sleep apnea through a sleep study? No  Do you snore loudly (loud enough to be heard through closed doors)?  0  Do you often feel tired, fatigued, or sleepy during the daytime? 0  Has anyone observed you stop breathing during your sleep? 1  Do you have, or are you being treated for high blood pressure? 1  BMI more than 35 kg/m2? 1  Age over 55 years old? 1  Neck circumference greater than 40 cm/18 inches? 0  Gender: 0  Obstructive Sleep Apnea Score 4  Score 4 or greater  Results sent to PCP

## 2013-03-12 NOTE — Progress Notes (Signed)
EKG 02/11/13 on EPIC

## 2013-03-19 ENCOUNTER — Inpatient Hospital Stay (HOSPITAL_COMMUNITY): Payer: BC Managed Care – PPO | Admitting: Anesthesiology

## 2013-03-19 ENCOUNTER — Encounter (HOSPITAL_COMMUNITY)
Admission: RE | Disposition: A | Discharge status: Home Health | Payer: Self-pay | Source: Ambulatory Visit | Attending: General Surgery

## 2013-03-19 ENCOUNTER — Encounter (HOSPITAL_COMMUNITY): Payer: Self-pay | Admitting: Anesthesiology

## 2013-03-19 ENCOUNTER — Inpatient Hospital Stay (HOSPITAL_COMMUNITY)
Admission: RE | Admit: 2013-03-19 | Discharge: 2013-03-25 | DRG: 160 | Disposition: A | Discharge status: Home Health | Payer: BC Managed Care – PPO | Source: Ambulatory Visit | Attending: General Surgery | Admitting: General Surgery

## 2013-03-19 ENCOUNTER — Encounter (HOSPITAL_COMMUNITY): Payer: Self-pay | Admitting: *Deleted

## 2013-03-19 DIAGNOSIS — J45909 Unspecified asthma, uncomplicated: Secondary | ICD-10-CM | POA: Diagnosis present

## 2013-03-19 DIAGNOSIS — R0602 Shortness of breath: Secondary | ICD-10-CM | POA: Diagnosis not present

## 2013-03-19 DIAGNOSIS — K420 Umbilical hernia with obstruction, without gangrene: Secondary | ICD-10-CM | POA: Diagnosis present

## 2013-03-19 DIAGNOSIS — Z87891 Personal history of nicotine dependence: Secondary | ICD-10-CM

## 2013-03-19 DIAGNOSIS — K439 Ventral hernia without obstruction or gangrene: Secondary | ICD-10-CM

## 2013-03-19 DIAGNOSIS — K436 Other and unspecified ventral hernia with obstruction, without gangrene: Principal | ICD-10-CM | POA: Diagnosis present

## 2013-03-19 HISTORY — PX: INSERTION OF MESH: SHX5868

## 2013-03-19 HISTORY — PX: APPLICATION OF WOUND VAC: SHX5189

## 2013-03-19 HISTORY — PX: VENTRAL HERNIA REPAIR: SHX424

## 2013-03-19 LAB — GLUCOSE, CAPILLARY: Glucose-Capillary: 144 mg/dL — ABNORMAL HIGH (ref 70–99)

## 2013-03-19 SURGERY — REPAIR, HERNIA, VENTRAL
Anesthesia: General | Site: Abdomen | Wound class: Clean

## 2013-03-19 MED ORDER — SODIUM CHLORIDE 0.9 % IJ SOLN: 9.0000 mL | INTRAMUSCULAR | Status: DC | PRN

## 2013-03-19 MED ORDER — KETOROLAC TROMETHAMINE 30 MG/ML IJ SOLN
INTRAMUSCULAR | Status: AC
  Filled 2013-03-19: qty 1

## 2013-03-19 MED ORDER — KETOROLAC TROMETHAMINE 30 MG/ML IJ SOLN
15.0000 mg | Freq: Once | INTRAMUSCULAR | Status: AC | PRN
  Administered 2013-03-19: 30 mg via INTRAVENOUS

## 2013-03-19 MED ORDER — PROPOFOL 10 MG/ML IV BOLUS
INTRAVENOUS | Status: DC | PRN
  Administered 2013-03-19: 150 mg via INTRAVENOUS

## 2013-03-19 MED ORDER — HYDROMORPHONE HCL PF 1 MG/ML IJ SOLN
INTRAMUSCULAR | Status: AC
  Filled 2013-03-19: qty 1

## 2013-03-19 MED ORDER — PROMETHAZINE HCL 25 MG/ML IJ SOLN: 6.2500 mg | INTRAMUSCULAR | Status: DC | PRN

## 2013-03-19 MED ORDER — NALOXONE HCL 0.4 MG/ML IJ SOLN: 0.4000 mg | INTRAMUSCULAR | Status: DC | PRN

## 2013-03-19 MED ORDER — CEFAZOLIN SODIUM-DEXTROSE 2-3 GM-% IV SOLR
2.0000 g | INTRAVENOUS | Status: AC
  Administered 2013-03-19: 2 g via INTRAVENOUS

## 2013-03-19 MED ORDER — DIPHENHYDRAMINE HCL 12.5 MG/5ML PO ELIX: 12.5000 mg | ORAL_SOLUTION | Freq: Four times a day (QID) | ORAL | Status: DC | PRN

## 2013-03-19 MED ORDER — MORPHINE SULFATE (PF) 1 MG/ML IV SOLN
INTRAVENOUS | Status: AC
  Administered 2013-03-19: 1 mg
  Filled 2013-03-19: qty 25

## 2013-03-19 MED ORDER — LACTATED RINGERS IV SOLN: INTRAVENOUS | Status: DC

## 2013-03-19 MED ORDER — LACTATED RINGERS IV SOLN
INTRAVENOUS | Status: DC | PRN
  Administered 2013-03-19 (×3): via INTRAVENOUS

## 2013-03-19 MED ORDER — MORPHINE SULFATE (PF) 1 MG/ML IV SOLN
INTRAVENOUS | Status: DC
  Administered 2013-03-19: 2 mg via INTRAVENOUS
  Administered 2013-03-20: 11 mg via INTRAVENOUS
  Administered 2013-03-20: 6 mg via INTRAVENOUS
  Administered 2013-03-20: 4 mg via INTRAVENOUS
  Administered 2013-03-20: 12:00:00 via INTRAVENOUS
  Administered 2013-03-20: 2 mg via INTRAVENOUS
  Administered 2013-03-20: 6.74 mg via INTRAVENOUS
  Administered 2013-03-21: 7.95 mg via INTRAVENOUS
  Administered 2013-03-21: 11.98 mg via INTRAVENOUS
  Administered 2013-03-21: 7 mg via INTRAVENOUS
  Administered 2013-03-21: 6 mg via INTRAVENOUS
  Administered 2013-03-21: 4 mg via INTRAVENOUS
  Administered 2013-03-21 – 2013-03-22 (×2): via INTRAVENOUS
  Administered 2013-03-22: 3 mg via INTRAVENOUS
  Administered 2013-03-22: 11 mg via INTRAVENOUS
  Administered 2013-03-22: 8 mg via INTRAVENOUS
  Administered 2013-03-22: 10.95 mg via INTRAVENOUS
  Administered 2013-03-22: 6 mg via INTRAVENOUS
  Administered 2013-03-22: 7 mg via INTRAVENOUS
  Administered 2013-03-23: 3 mg via INTRAVENOUS
  Administered 2013-03-23: 5 mg via INTRAVENOUS
  Filled 2013-03-19 (×4): qty 25

## 2013-03-19 MED ORDER — EPHEDRINE SULFATE 50 MG/ML IJ SOLN
INTRAMUSCULAR | Status: DC | PRN
  Administered 2013-03-19: 20 mg via INTRAVENOUS
  Administered 2013-03-19: 15 mg via INTRAVENOUS

## 2013-03-19 MED ORDER — ONDANSETRON HCL 4 MG/2ML IJ SOLN
4.0000 mg | Freq: Four times a day (QID) | INTRAMUSCULAR | Status: DC | PRN
  Administered 2013-03-21: 4 mg via INTRAVENOUS
  Filled 2013-03-19: qty 2

## 2013-03-19 MED ORDER — ONDANSETRON HCL 4 MG/2ML IJ SOLN: 4.0000 mg | Freq: Four times a day (QID) | INTRAMUSCULAR | Status: DC | PRN

## 2013-03-19 MED ORDER — GLYCOPYRROLATE 0.2 MG/ML IJ SOLN
INTRAMUSCULAR | Status: DC | PRN
  Administered 2013-03-19: 0.6 mg via INTRAVENOUS

## 2013-03-19 MED ORDER — KCL IN DEXTROSE-NACL 20-5-0.45 MEQ/L-%-% IV SOLN
INTRAVENOUS | Status: AC
  Administered 2013-03-19: 1000 mL
  Filled 2013-03-19: qty 1000

## 2013-03-19 MED ORDER — ENOXAPARIN SODIUM 40 MG/0.4ML ~~LOC~~ SOLN
40.0000 mg | SUBCUTANEOUS | Status: DC
  Administered 2013-03-20 – 2013-03-25 (×6): 40 mg via SUBCUTANEOUS
  Filled 2013-03-19 (×7): qty 0.4

## 2013-03-19 MED ORDER — SUCCINYLCHOLINE CHLORIDE 20 MG/ML IJ SOLN
INTRAMUSCULAR | Status: DC | PRN
  Administered 2013-03-19: 100 mg via INTRAVENOUS

## 2013-03-19 MED ORDER — DIPHENHYDRAMINE HCL 50 MG/ML IJ SOLN: 12.5000 mg | Freq: Four times a day (QID) | INTRAMUSCULAR | Status: DC | PRN

## 2013-03-19 MED ORDER — OXYCODONE-ACETAMINOPHEN 5-325 MG PO TABS
1.0000 | ORAL_TABLET | ORAL | Status: DC | PRN
  Administered 2013-03-23 – 2013-03-25 (×7): 1 via ORAL
  Filled 2013-03-19 (×7): qty 1

## 2013-03-19 MED ORDER — INSULIN ASPART 100 UNIT/ML ~~LOC~~ SOLN
0.0000 [IU] | SUBCUTANEOUS | Status: DC
  Administered 2013-03-19 – 2013-03-20 (×3): 2 [IU] via SUBCUTANEOUS
  Administered 2013-03-20: 3 [IU] via SUBCUTANEOUS
  Administered 2013-03-20: 1 [IU] via SUBCUTANEOUS
  Administered 2013-03-20 – 2013-03-21 (×2): 2 [IU] via SUBCUTANEOUS

## 2013-03-19 MED ORDER — HYDROMORPHONE HCL PF 1 MG/ML IJ SOLN
0.2500 mg | INTRAMUSCULAR | Status: DC | PRN
  Administered 2013-03-19 (×3): 0.25 mg via INTRAVENOUS

## 2013-03-19 MED ORDER — NEOSTIGMINE METHYLSULFATE 1 MG/ML IJ SOLN
INTRAMUSCULAR | Status: DC | PRN
  Administered 2013-03-19: 4 mg via INTRAVENOUS

## 2013-03-19 MED ORDER — FENTANYL CITRATE 0.05 MG/ML IJ SOLN
INTRAMUSCULAR | Status: DC | PRN
  Administered 2013-03-19 (×2): 100 ug via INTRAVENOUS

## 2013-03-19 MED ORDER — ONDANSETRON HCL 4 MG PO TABS: 4.0000 mg | ORAL_TABLET | Freq: Four times a day (QID) | ORAL | Status: DC | PRN

## 2013-03-19 MED ORDER — ONDANSETRON HCL 4 MG/2ML IJ SOLN
INTRAMUSCULAR | Status: DC | PRN
  Administered 2013-03-19: 4 mg via INTRAVENOUS

## 2013-03-19 MED ORDER — CISATRACURIUM BESYLATE (PF) 10 MG/5ML IV SOLN
INTRAVENOUS | Status: DC | PRN
  Administered 2013-03-19: 6 mg via INTRAVENOUS
  Administered 2013-03-19: 2 mg via INTRAVENOUS
  Administered 2013-03-19 (×2): 3 mg via INTRAVENOUS

## 2013-03-19 MED ORDER — CEFAZOLIN SODIUM-DEXTROSE 2-3 GM-% IV SOLR
2.0000 g | Freq: Three times a day (TID) | INTRAVENOUS | Status: AC
  Administered 2013-03-19 – 2013-03-20 (×3): 2 g via INTRAVENOUS
  Filled 2013-03-19 (×3): qty 50

## 2013-03-19 MED ORDER — KCL IN DEXTROSE-NACL 20-5-0.45 MEQ/L-%-% IV SOLN
INTRAVENOUS | Status: DC
  Administered 2013-03-19 – 2013-03-22 (×5): via INTRAVENOUS
  Administered 2013-03-22: 75 mL/h via INTRAVENOUS
  Administered 2013-03-23 (×2): via INTRAVENOUS
  Filled 2013-03-19 (×12): qty 1000

## 2013-03-19 MED ORDER — 0.9 % SODIUM CHLORIDE (POUR BTL) OPTIME
TOPICAL | Status: DC | PRN
  Administered 2013-03-19: 2000 mL

## 2013-03-19 MED ORDER — HEPARIN SODIUM (PORCINE) 5000 UNIT/ML IJ SOLN
5000.0000 [IU] | Freq: Once | INTRAMUSCULAR | Status: AC
  Administered 2013-03-19: 5000 [IU] via SUBCUTANEOUS
  Filled 2013-03-19: qty 1

## 2013-03-19 MED ORDER — MIDAZOLAM HCL 5 MG/5ML IJ SOLN
INTRAMUSCULAR | Status: DC | PRN
  Administered 2013-03-19: 2 mg via INTRAVENOUS

## 2013-03-19 MED ORDER — DEXAMETHASONE SODIUM PHOSPHATE 10 MG/ML IJ SOLN
INTRAMUSCULAR | Status: DC | PRN
  Administered 2013-03-19: 10 mg via INTRAVENOUS

## 2013-03-19 SURGICAL SUPPLY — 46 items
BENZOIN TINCTURE PRP APPL 2/3 (GAUZE/BANDAGES/DRESSINGS) IMPLANT
BIOPATCH WHT 1IN DISK W/4.0 H (GAUZE/BANDAGES/DRESSINGS) ×3 IMPLANT
BLADE HEX COATED 2.75 (ELECTRODE) ×3 IMPLANT
CANISTER SUCTION 2500CC (MISCELLANEOUS) ×3 IMPLANT
CLOTH BEACON ORANGE TIMEOUT ST (SAFETY) ×3 IMPLANT
DECANTER SPIKE VIAL GLASS SM (MISCELLANEOUS) IMPLANT
DERMABOND ADVANCED (GAUZE/BANDAGES/DRESSINGS) ×1
DERMABOND ADVANCED .7 DNX12 (GAUZE/BANDAGES/DRESSINGS) ×2 IMPLANT
DRAIN CHANNEL 19F RND (DRAIN) ×6 IMPLANT
DRAPE INCISE IOBAN 85X60 (DRAPES) ×3 IMPLANT
DRAPE LAPAROSCOPIC ABDOMINAL (DRAPES) ×3 IMPLANT
DRAPE UTILITY XL STRL (DRAPES) ×3 IMPLANT
DRSG TEGADERM 2-3/8X2-3/4 SM (GAUZE/BANDAGES/DRESSINGS) ×3 IMPLANT
DRSG VAC ATS SM SENSATRAC (GAUZE/BANDAGES/DRESSINGS) ×3 IMPLANT
ELECT REM PT RETURN 9FT ADLT (ELECTROSURGICAL) ×3
ELECTRODE REM PT RTRN 9FT ADLT (ELECTROSURGICAL) ×2 IMPLANT
EVACUATOR SILICONE 100CC (DRAIN) ×6 IMPLANT
GAUZE XEROFORM 5X9 LF (GAUZE/BANDAGES/DRESSINGS) ×3 IMPLANT
GLOVE BIO SURGEON STRL SZ7 (GLOVE) ×3 IMPLANT
GLOVE BIOGEL PI IND STRL 7.0 (GLOVE) ×2 IMPLANT
GLOVE BIOGEL PI INDICATOR 7.0 (GLOVE) ×1
GLOVE SURG SS PI 7.5 STRL IVOR (GLOVE) ×6 IMPLANT
GOWN STRL NON-REIN LRG LVL3 (GOWN DISPOSABLE) IMPLANT
GOWN STRL REIN XL XLG (GOWN DISPOSABLE) ×9 IMPLANT
KIT BASIN OR (CUSTOM PROCEDURE TRAY) ×3 IMPLANT
MESH BARD SOFT 6X6IN (Mesh General) ×3 IMPLANT
NEEDLE HYPO 22GX1.5 SAFETY (NEEDLE) IMPLANT
NS IRRIG 1000ML POUR BTL (IV SOLUTION) ×3 IMPLANT
PACK GENERAL/GYN (CUSTOM PROCEDURE TRAY) ×3 IMPLANT
SPONGE GAUZE 4X4 12PLY (GAUZE/BANDAGES/DRESSINGS) IMPLANT
SPONGE LAP 18X18 X RAY DECT (DISPOSABLE) IMPLANT
STAPLER VISISTAT 35W (STAPLE) IMPLANT
STRIP CLOSURE SKIN 1/2X4 (GAUZE/BANDAGES/DRESSINGS) IMPLANT
SUT ETHILON 2 0 PS N (SUTURE) ×6 IMPLANT
SUT MNCRL AB 4-0 PS2 18 (SUTURE) IMPLANT
SUT PDS AB 0 CTX 36 PDP370T (SUTURE) ×6 IMPLANT
SUT PDS AB 1 TP1 96 (SUTURE) ×6 IMPLANT
SUT PROLENE 0 CT 1 CR/8 (SUTURE) ×3 IMPLANT
SUT PROLENE 0 CT 2 (SUTURE) ×3 IMPLANT
SUT VIC AB 2-0 SH 27 (SUTURE) ×1
SUT VIC AB 2-0 SH 27X BRD (SUTURE) ×2 IMPLANT
SUT VICRYL 2 0 18  UND BR (SUTURE) ×1
SUT VICRYL 2 0 18 UND BR (SUTURE) ×2 IMPLANT
SYR CONTROL 10ML LL (SYRINGE) IMPLANT
TOWEL OR 17X26 10 PK STRL BLUE (TOWEL DISPOSABLE) ×3 IMPLANT
TOWEL OR NON WOVEN STRL DISP B (DISPOSABLE) ×3 IMPLANT

## 2013-03-19 NOTE — Anesthesia Preprocedure Evaluation (Signed)
Anesthesia Evaluation  Patient identified by MRN, date of birth, ID band Patient awake    Reviewed: Allergy & Precautions, H&P , NPO status , Patient's Chart, lab work & pertinent test results  Airway Mallampati: III TM Distance: <3 FB Neck ROM: Full    Dental  (+) Missing, Poor Dentition and Loose   Pulmonary shortness of breath, with exertion and lying,  Smoking Status: Former Smoker - 120 pack years    Quit Smoking: 12/17/12   breath sounds clear to auscultation  + decreased breath sounds      Cardiovascular hypertension, Pt. on medications Rhythm:Regular Rate:Normal     Neuro/Psych negative neurological ROS  negative psych ROS   GI/Hepatic negative GI ROS, Neg liver ROS,   Endo/Other  Morbid obesity  Renal/GU negative Renal ROS  negative genitourinary   Musculoskeletal negative musculoskeletal ROS (+)   Abdominal   Peds negative pediatric ROS (+)  Hematology negative hematology ROS (+)   Anesthesia Other Findings   Reproductive/Obstetrics negative OB ROS                           Anesthesia Physical Anesthesia Plan  ASA: III  Anesthesia Plan: General   Post-op Pain Management:    Induction: Intravenous  Airway Management Planned: Oral ETT  Additional Equipment:   Intra-op Plan:   Post-operative Plan: Extubation in OR and Possible Post-op intubation/ventilation  Informed Consent: I have reviewed the patients History and Physical, chart, labs and discussed the procedure including the risks, benefits and alternatives for the proposed anesthesia with the patient or authorized representative who has indicated his/her understanding and acceptance.   Dental advisory given  Plan Discussed with: CRNA and Surgeon  Anesthesia Plan Comments:         Anesthesia Quick Evaluation

## 2013-03-19 NOTE — H&P (Signed)
Deborah [name] is a 57 y.o. female. This patient returns today for evaluation of her umbilical and ventral hernia which has been present for 4-5 years. She says it is symptomatic and does cause discomfort and prevents her from wearing button up jeans. She did not have any obstructive symptoms but the bulge does not reduce. She says it really hasn't changed much since our last visit but she has stopped smoking in the last time she smoked was on June 3. She is on Chantix and says that she has not even smoked one cigarette since then. No chest pain, no cough. She just had her routine physical with her physician. She denies any changes since our last visit and says that she has been 93 days without smoking. HPI  Past Medical History   Diagnosis  Date   .  Umbilical hernia    .  Asthma     Past Surgical History   Procedure  Laterality  Date   .  Cesarean section   04/12/85    Family History   Problem  Relation  Age of Onset   .  Heart attack  Mother    Social History  History   Substance Use Topics   .  Smoking status:  Current Every Day Smoker -- 1.00 packs/day     Types:  Cigarettes   .  Smokeless tobacco:  Not on file      Comment: No cigs since 12/17/12   .  Alcohol Use:  Yes      Comment: 1 month   No Known Allergies  Current Outpatient Prescriptions   Medication  Sig  Dispense  Refill   .  Aspirin-Caffeine (BC FAST PAIN RELIEF ARTHRITIS PO)  Take 4 by mouth daily     .  cetirizine (ZYRTEC) 10 MG tablet  Take 10 mg by mouth daily.     .  Olopatadine HCl (PATADAY) 0.2 % SOLN  One drop in each eye twice a day     .  varenicline (CHANTIX CONTINUING MONTH PAK) 1 MG tablet  Take 1 tablet (1 mg total) by mouth 2 (two) times daily.  60 tablet  3    No current facility-administered medications for this visit.   Review of Systems  Review of Systems  All other review of systems negative or noncontributory except as stated in the HPI  Wt Readings from Last 3 Encounters:  03/12/13 246 lb  (111.585 kg)  02/11/13 244 lb (110.678 kg)  02/07/13 242 lb 6.4 oz (109.952 kg)   Temp Readings from Last 3 Encounters:  03/19/13 98.2 F (36.8 C) Oral  03/19/13 98.2 F (36.8 C) Oral  03/12/13 98 F (36.7 C) Oral   BP Readings from Last 3 Encounters:  03/19/13 157/112  03/19/13 157/112  03/12/13 159/103   Pulse Readings from Last 3 Encounters:  03/19/13 103  03/19/13 103  03/12/13 96    Physical Exam  Physical Exam  Physical Exam  Nursing note and vitals reviewed.  Constitutional: She is oriented to person, place, and time. She appears well-developed and well-nourished. No distress.  HENT:  Head: Normocephalic and atraumatic.  Mouth/Throat: No oropharyngeal exudate.  Eyes: Conjunctivae and EOM are normal. Pupils are equal, round, and reactive to light. Right eye exhibits no discharge. Left eye exhibits no discharge. No scleral icterus.  Neck: Normal range of motion. Neck supple. No tracheal deviation present.  Cardiovascular: Normal rate, regular rhythm, normal heart sounds and intact distal pulses.  Pulmonary/Chest: Effort  normal and breath sounds normal. No stridor. No respiratory distress. She has no wheezes.  Abdominal: Soft. Bowel sounds are normal. She exhibits no distension. There is no tenderness. There is no rebound and no guarding. She does have a sizable umbilical bulge consistent with her known ventral hernia. This is not reducible and is somewhat tender on exam today. There is no sign of strangulation and no skin changes.  Musculoskeletal: Normal range of motion. She exhibits no edema and no tenderness.  Neurological: She is alert and oriented to person, place, and time.  Skin: Skin is warm and dry. No rash noted. She is not diaphoretic. No erythema. No pallor.  Psychiatric: She has a normal mood and affect. Her behavior is normal. Judgment and thought content normal.  Data Reviewed  CT  Assessment  Umbilical and ventral hernia-incarcerated She was seen and  examined in the preop area.  Site marked.  Her hernia is not reducible completely.  She has not smoked for 3 days.  I again discussed with her open or lap hernia repair and she wants to proceed with open ventral hernia repair with mesh.  Risks of infection, bleeding, pain, scarring, recurrence, bowel injury, and death, persistent seroma or bulge and chronic pain, and wound complications discussed in lay terms and she expressed understanding and desires to proceed with open ventral hernia repair with mesh, and possible component separation.

## 2013-03-19 NOTE — Brief Op Note (Signed)
03/19/2013  4:22 PM  PATIENT:  [name]  57 y.o. female  PRE-OPERATIVE DIAGNOSIS:  colon cancer   POST-OPERATIVE DIAGNOSIS:  colon cancer   PROCEDURE:  Procedure(s):  OPEN VENTRAL HERNIA REPAIR WITH MESH AND APPLICATION OF WOUND VAC (N/A) INSERTION OF MESH (N/A) APPLICATION OF WOUND VAC  SURGEON:  Surgeon(s) and Role:    * Lodema Pilot, DO - Primary    * Almond Lint, MD - Assisting  PHYSICIAN ASSISTANT:   ASSISTANTS: [place]   ANESTHESIA:   general  EBL:  Total I/O In: 2650 [I.V.:2650] Out: 550 [Urine:400; Blood:150]  BLOOD ADMINISTERED:none  DRAINS: (right drain in retrorectus space) Jackson-Pratt drain(s) with closed bulb suction in the left drain in the subq tissue and wound vac on skin   LOCAL MEDICATIONS USED:  NONE  SPECIMEN:  No Specimen  DISPOSITION OF SPECIMEN:  N/A  COUNTS:  YES  TOURNIQUET:  * No tourniquets in log *  DICTATION: .Other Dictation: Dictation Number dictated  PLAN OF CARE: Admit to inpatient   PATIENT DISPOSITION:  PACU - hemodynamically stable.   Delay start of Pharmacological VTE agent (>24hrs) due to surgical blood loss or risk of bleeding: no

## 2013-03-19 NOTE — Transfer of Care (Signed)
Immediate Anesthesia Transfer of Care Note  Patient: Deborah [name]  Procedure(s) Performed: Procedure(s):  OPEN VENTRAL HERNIA REPAIR WITH MESH AND APPLICATION OF WOUND VAC (N/A) INSERTION OF MESH (N/A) APPLICATION OF WOUND VAC  Patient Location: PACU  Anesthesia Type:General  Level of Consciousness: awake, sedated, patient cooperative and Patient remains intubated per anesthesia plan  Airway & Oxygen Therapy: Patient Spontanous Breathing and Patient connected to T-piece oxygen  Post-op Assessment: Report given to PACU RN and Post -op Vital signs reviewed and stable  Post vital signs: Reviewed and stable  Complications: No apparent anesthesia complications

## 2013-03-19 NOTE — Anesthesia Postprocedure Evaluation (Signed)
  Anesthesia Post-op Note  Patient: Deborah [name]  Procedure(s) Performed: Procedure(s) (LRB):  OPEN VENTRAL HERNIA REPAIR WITH MESH AND APPLICATION OF WOUND VAC (N/A) INSERTION OF MESH (N/A) APPLICATION OF WOUND VAC  Patient Location: PACU  Anesthesia Type: General  Level of Consciousness: awake and alert   Airway and Oxygen Therapy: Patient Spontanous Breathing  Post-op Pain: mild  Post-op Assessment: Post-op Vital signs reviewed, Patient's Cardiovascular Status Stable, Respiratory Function Stable, Patent Airway and No signs of Nausea or vomiting  Last Vitals:  Filed Vitals:   03/19/13 1700  BP: 142/89  Pulse: 107  Temp:   Resp: 23    Post-op Vital Signs: stable   Complications: No apparent anesthesia complications

## 2013-03-20 ENCOUNTER — Encounter (HOSPITAL_COMMUNITY): Payer: Self-pay | Admitting: General Surgery

## 2013-03-20 LAB — CBC
HCT: 39.3 % (ref 36.0–46.0)
Hemoglobin: 12.5 g/dL (ref 12.0–15.0)
MCH: 29.3 pg (ref 26.0–34.0)
MCHC: 31.8 g/dL (ref 30.0–36.0)
MCV: 92.3 fL (ref 78.0–100.0)
Platelets: 252 10*3/uL (ref 150–400)
RBC: 4.26 MIL/uL (ref 3.87–5.11)
RDW: 14.5 % (ref 11.5–15.5)
WBC: 12.6 10*3/uL — ABNORMAL HIGH (ref 4.0–10.5)

## 2013-03-20 LAB — BASIC METABOLIC PANEL
BUN: 10 mg/dL (ref 6–23)
CO2: 26 mEq/L (ref 19–32)
Calcium: 8.9 mg/dL (ref 8.4–10.5)
Chloride: 103 mEq/L (ref 96–112)
Creatinine, Ser: 0.56 mg/dL (ref 0.50–1.10)
GFR calc Af Amer: >90 mL/min (ref >90)
GFR calc non Af Amer: >90 mL/min (ref >90)
Glucose, Bld: 150 mg/dL — ABNORMAL HIGH (ref 70–99)
Potassium: 4.3 mEq/L (ref 3.5–5.1)
Sodium: 138 mEq/L (ref 135–145)

## 2013-03-20 LAB — GLUCOSE, CAPILLARY
Glucose-Capillary: 141 mg/dL — ABNORMAL HIGH (ref 70–99)
Glucose-Capillary: 151 mg/dL — ABNORMAL HIGH (ref 70–99)
Glucose-Capillary: 133 mg/dL — ABNORMAL HIGH (ref 70–99)
Glucose-Capillary: 127 mg/dL — ABNORMAL HIGH (ref 70–99)
Glucose-Capillary: 102 mg/dL — ABNORMAL HIGH (ref 70–99)
Glucose-Capillary: 122 mg/dL — ABNORMAL HIGH (ref 70–99)

## 2013-03-20 NOTE — Progress Notes (Signed)
Doing okay.  Not ambulating yet.  HR okay.  Wound vac issues (low pressure) resolved.  Doing okay.  Wean oxygen and mobilize

## 2013-03-20 NOTE — Op Note (Signed)
Deborah [name], Deborah [name] NO.:  [id number]  MEDICAL RECORD NO.:  [id number]  LOCATION:  1526                         FACILITY:  [facility]  PHYSICIAN:  Lodema Pilot, MD       DATE OF BIRTH:  [birth year]  DATE OF PROCEDURE:  03/19/2013 DATE OF DISCHARGE:                              OPERATIVE REPORT   PREOPERATIVE DIAGNOSIS:  Ventral hernia.  POSTOP DIAGNOSIS:  Ventral hernia.  PROCEDURE:  Exploratory laparotomy with ventral hernia repair with mesh and incisional wound VAC placement.  SURGEON:  Lodema Pilot, MD  ASSISTANT:  Dr. Fredric Mare.  ANESTHESIA:  General endotracheal tube anesthesia.  FLUIDS:  2700 mL of crystalloid.  ESTIMATED BLOOD LOSS:  100 mL.  SPECIMENS:  None.  DRAINS:  Right lower quadrant drain in the retrorectus space on the mesh.  The left lower quadrant drain was placed in the subcutaneous tissue.  COMPLICATIONS:  None apparent.  FINDINGS:  A 5-cm fascial defect repaired with retrorectus placement of 15 cm x 15 cm Prolene soft mesh and primary closure of the fascia over the mesh.  Right lower quadrant drain in the right retrorectus space on the fascia on the muscle and the left lower quadrant drain in the subcutaneous tissue.  Incisional wound VAC was placed over the incision.  INDICATION FOR PROCEDURE:  Deborah [name] is a 57 year old female with incarcerated ventral hernia by history and exam to do desired repair. She has seen me several months ago, and since our initial evaluation, she has stopped smoking and desires ventral hernia repair.  OPERATIVE DETAILS:  Deborah [name] was seen and evaluated in the preoperative area and risks and benefits of procedure were again discussed in lay terms.  Informed consent was obtained.  The surgical site was marked prior to anesthetic administration.  She was taken to the operating room, placed on table in supine position.  She was given subcutaneous heparin and prophylactic antibiotics, and  general endotracheal tube anesthesia obtained.  Foley catheter was placed and her abdomen was prepped and draped in a standard surgical fashion. Ioban drape was placed on the skin to minimize contact with the mesh.  A procedure time-out was performed with all operative team members confirmed proper patient and procedure.  A midline incision was made over the hernia bulge, and carried around the umbilicus and carried cephalad just above the umbilicus.  I entered the abdomen above the umbilicus, a normal appearing fascia and opened the fascia into the peritoneum.  I was able to then carry the dissection down to the upper aspect of the hernia defect and after I opened up the edge of the hernia defect, I was able to reduce the hernia contents which was a very large amount of omentum, but there was not any bowel contents in the defect. I carried the incision caudad over the defect and opened up the hernia sac.  She had a very large hernia sac in the subcutaneous space.  The hernia sac was freed up from the subcutaneous fat and amputated at the edge of the fascia.  This was basically the umbilical hernia with incarcerated omentum.  The defect was about 5 cm in diameter, circular defect.  I  opened up the posterior sheath taking down the peritoneum and the posterior rectus sheath bilaterally and entered retrorectus space. I developed a space towards the bladder in the preperitoneal space.  I carried this around to the others contralateral side, and on both sides up to the midline where the rectus was fused in the midline.  After I created this space I inspected the underlying bowel contents and there was no significant adhesiolysis which was performed as this was really only omentum in the hernia defect.  I then approximated the posterior sheath and peritoneum with 0 PDS suture from both the top and the bottom, taking care to avoid injury to the underlying bowel contents. With the posterior  sheath closed, we counted our laparotomy sponges and all the counts were correct to this point, and then decided to place a Prolene soft midway mesh in this space.  A 15 cm x 15 cm mesh was placed and oriented diagonally to allow for wider coverage at the midline and transfascial sutures were placed circumferentially starting in the lower midline using 0 Prolene sutures.  These were brought up to the fascia and I placed 8 sutures around the edge of the mesh and sequentially brought these up transfascially with a laparoscopic suture passer under direct visualization.  Through this fashion, the skin in the upper midline, I split the edge of the mesh and anchored the tails of the mesh near the midline in similar fashion.  The sutures were all tied down, and the mesh laid flat and covered widely the hernia defect.  The retrorectus space was irrigated with sterile saline solution and inspected for hemostasis, which was noted to be adequate.  A 19-French Blake drain was passed through into the space to the right lower quadrant and sutured in place.  There was a single drain in the retrorectus space that hooked up and around and then I approximated the anterior fascia with a #1 looped PDS x2, taking care to avoid suturing the drain in place.  These sutures were secured in the middle and I irrigated the subcutaneous space and hemostasis was obtained with Bovie electrocautery.  Because of the large amount of omentum which had herniated up into the space, and the stretching of the skin she had a very large empty space, and so I placed a second drain in the subcutaneous tissue, and then approximated the skin edges with skin staples.  I then placed an incisional wound VAC on the wound with Xeroform gauze, and a black sponge on top of this avoiding contact of the sponge with skin.  The transfascial suture sites were closed with Dermabond.  An antibiotic Biopatch was placed around each drain  and sterile dressings were applied.  The patient tolerated the procedure well without apparent complication.  She was hemodynamically stable and ready for transfer to the recovery room in stable condition.          ______________________________ Lodema Pilot, MD     BL/MEDQ  D:  03/19/2013  T:  03/20/2013  Job:  [id number]

## 2013-03-20 NOTE — Progress Notes (Signed)
1 Day Post-Op  Subjective: Pain controlled.    Objective: Vital signs in last 24 hours: Temp:  [96.8 F (36 C)-98.3 F (36.8 C)] 97.8 F (36.6 C) (09/04 0618) Pulse Rate:  [97-115] 97 (09/04 0618) Resp:  [18-24] 18 (09/04 0618) BP: (106-159)/(73-112) 116/76 mmHg (09/04 0618) SpO2:  [92 %-99 %] 98 % (09/04 0618) FiO2 (%):  [40 %] 40 % (09/03 1630) Weight:  [246 lb (111.585 kg)] 246 lb (111.585 kg) (09/03 1946) Last BM Date: 03/18/13  Intake/Output from previous day: 09/03 0701 - 09/04 0700 In: 4058.3 [I.V.:3958.3; IV Piggyback:100] Out: 2645 [Urine:2200; Drains:295; Blood:150] Intake/Output this shift:    General appearance: alert, cooperative and no distress Resp: nonlabored Cardio: normal rate, regular GI: soft, appropriate tenderness, ND, incisional vac in place, JP's thin bloody drainage bilat Extremities: SCD's bilat  Lab Results:   Recent Labs  03/20/13 0400  WBC 12.6*  HGB 12.5  HCT 39.3  PLT 252   BMET  Recent Labs  03/20/13 0400  NA 138  K 4.3  CL 103  CO2 26  GLUCOSE 150*  BUN 10  CREATININE 0.56  CALCIUM 8.9   PT/INR No results found for this basename: LABPROT, INR,  in the last 72 hours ABG No results found for this basename: PHART, PCO2, PO2, HCO3,  in the last 72 hours  Studies/Results: No results found.  Anti-infectives: Anti-infectives   Start     Dose/Rate Route Frequency Ordered Stop   03/19/13 2200  ceFAZolin (ANCEF) IVPB 2 g/50 mL premix     2 g 100 mL/hr over 30 Minutes Intravenous 3 times per day 03/19/13 1759 03/20/13 2159   03/19/13 1015  ceFAZolin (ANCEF) IVPB 2 g/50 mL premix     2 g 100 mL/hr over 30 Minutes Intravenous On call to O.R. 03/19/13 1001 03/19/13 1300      Assessment/Plan: s/p Procedure(s):  OPEN VENTRAL HERNIA REPAIR WITH MESH AND APPLICATION OF WOUND VAC (N/A) INSERTION OF MESH (N/A) APPLICATION OF WOUND VAC she seems to be doing okay.  plan to start clear liquids today.  LOS: 1 day    Lodema Pilot DAVID 03/20/2013

## 2013-03-21 ENCOUNTER — Inpatient Hospital Stay (HOSPITAL_COMMUNITY): Payer: BC Managed Care – PPO

## 2013-03-21 LAB — CBC
HCT: 38.1 % (ref 36.0–46.0)
Hemoglobin: 11.9 g/dL — ABNORMAL LOW (ref 12.0–15.0)
MCH: 29.5 pg (ref 26.0–34.0)
MCHC: 31.2 g/dL (ref 30.0–36.0)
MCV: 94.3 fL (ref 78.0–100.0)
Platelets: 244 10*3/uL (ref 150–400)
RBC: 4.04 MIL/uL (ref 3.87–5.11)
RDW: 14.9 % (ref 11.5–15.5)
WBC: 14.2 10*3/uL — ABNORMAL HIGH (ref 4.0–10.5)

## 2013-03-21 LAB — BASIC METABOLIC PANEL
BUN: 9 mg/dL (ref 6–23)
CO2: 30 mEq/L (ref 19–32)
Calcium: 8.7 mg/dL (ref 8.4–10.5)
Chloride: 102 mEq/L (ref 96–112)
Creatinine, Ser: 0.57 mg/dL (ref 0.50–1.10)
GFR calc Af Amer: >90 mL/min (ref >90)
GFR calc non Af Amer: >90 mL/min (ref >90)
Glucose, Bld: 114 mg/dL — ABNORMAL HIGH (ref 70–99)
Potassium: 4.2 mEq/L (ref 3.5–5.1)
Sodium: 137 mEq/L (ref 135–145)

## 2013-03-21 LAB — GLUCOSE, CAPILLARY
Glucose-Capillary: 102 mg/dL — ABNORMAL HIGH (ref 70–99)
Glucose-Capillary: 104 mg/dL — ABNORMAL HIGH (ref 70–99)
Glucose-Capillary: 108 mg/dL — ABNORMAL HIGH (ref 70–99)
Glucose-Capillary: 111 mg/dL — ABNORMAL HIGH (ref 70–99)
Glucose-Capillary: 112 mg/dL — ABNORMAL HIGH (ref 70–99)
Glucose-Capillary: 117 mg/dL — ABNORMAL HIGH (ref 70–99)
Glucose-Capillary: 121 mg/dL — ABNORMAL HIGH (ref 70–99)

## 2013-03-21 MED ORDER — LACTATED RINGERS IV BOLUS (SEPSIS): 1000.0000 mL | INTRAVENOUS | Status: DC | PRN

## 2013-03-21 MED ORDER — ALBUTEROL SULFATE (5 MG/ML) 0.5% IN NEBU
2.5000 mg | INHALATION_SOLUTION | Freq: Four times a day (QID) | RESPIRATORY_TRACT | Status: DC | PRN
Start: 2013-03-21 — End: 2013-03-25
  Administered 2013-03-21 (×2): 2.5 mg via RESPIRATORY_TRACT
  Filled 2013-03-21 (×2): qty 0.5

## 2013-03-21 MED ORDER — LEVALBUTEROL HCL 0.63 MG/3ML IN NEBU
0.6300 mg | INHALATION_SOLUTION | Freq: Four times a day (QID) | RESPIRATORY_TRACT | Status: DC
  Administered 2013-03-21 – 2013-03-22 (×5): 0.63 mg via RESPIRATORY_TRACT
  Filled 2013-03-21 (×10): qty 3

## 2013-03-21 NOTE — Progress Notes (Signed)
Patient oxygen per nasal cannula gradually decreased through night per MD order.  Patient at 0.5 liter nasal cannula.  Patient up to restroom, oxygen removed.  When removed patient oxygen level decreased to low 70's heart rate up to 120's.  Patient placed back in bed oxygen replaced to 3 liters nasal cannula and encouraged to use pursed lip breathing.  Oxygen level increased to 93% with respirations at 24.  Audible wheeze heard from bedside as well as by auscultation.  Dr. Michaell Cowing paged by answering service.  Oxygen level currently 93% on 2 Liters nasal cannula respirations 22.  Await call

## 2013-03-21 NOTE — Progress Notes (Signed)
Pt desat to 74% when getting up & going to BR. O2 maintained & deep breathing done & brought O2 sats up to 94% with 2l O2 per n.c. Pt assisted back to bed from BR & noted to desat to 80% with exertion. HR up to 120's during transfers/ mobility. Albuterol neb tx  scheduled q 6h as ordered provides relief to pt as claimed. O2 sats back to 91-94% at 2l [place] when settled back in bed.

## 2013-03-21 NOTE — Progress Notes (Signed)
2 Days Post-Op  Subjective: She is tolerating liquids but abdomen "feels tight". Some SOB with exertion  Objective: Vital signs in last 24 hours: Temp:  [97.6 F (36.4 C)-98.1 F (36.7 C)] 98.1 F (36.7 C) (09/05 0620) Pulse Rate:  [80-92] 92 (09/05 0620) Resp:  [14-22] 22 (09/05 0620) BP: (120-135)/(80-85) 125/84 mmHg (09/05 0620) SpO2:  [92 %-99 %] 92 % (09/05 0620) Last BM Date: 03/18/13  Intake/Output from previous day: 09/04 0701 - 09/05 0700 In: 3764.3 [P.O.:1080; I.V.:2634.3; IV Piggyback:50] Out: 2085 [Urine:1850; Drains:235] Intake/Output this shift:    General appearance: alert, cooperative and no distress Resp: bilat wheezing Cardio: HR normal  GI: mild upper abdominal distension, tenderness improved, no peritoneal signs, JP's ss, incisional wound vac in place without sign of infection Extremities: SCD's bilat  Lab Results:   Recent Labs  03/20/13 0400 03/21/13 0415  WBC 12.6* 14.2*  HGB 12.5 11.9*  HCT 39.3 38.1  PLT 252 244   BMET  Recent Labs  03/20/13 0400 03/21/13 0415  NA 138 137  K 4.3 4.2  CL 103 102  CO2 26 30  GLUCOSE 150* 114*  BUN 10 9  CREATININE 0.56 0.57  CALCIUM 8.9 8.7   PT/INR No results found for this basename: LABPROT, INR,  in the last 72 hours ABG No results found for this basename: PHART, PCO2, PO2, HCO3,  in the last 72 hours  Studies/Results: No results found.  Anti-infectives: Anti-infectives   Start     Dose/Rate Route Frequency Ordered Stop   03/19/13 2200  ceFAZolin (ANCEF) IVPB 2 g/50 mL premix     2 g 100 mL/hr over 30 Minutes Intravenous 3 times per day 03/19/13 1759 03/20/13 1506   03/19/13 1015  ceFAZolin (ANCEF) IVPB 2 g/50 mL premix     2 g 100 mL/hr over 30 Minutes Intravenous On call to O.R. 03/19/13 1001 03/19/13 1300      Assessment/Plan: s/p Procedure(s):  OPEN VENTRAL HERNIA REPAIR WITH MESH AND APPLICATION OF WOUND VAC (N/A) INSERTION OF MESH (N/A) APPLICATION OF WOUND VAC cxr, and  breathing treatments and pulmonary toilet.  she seems to be improving from her hernia repair but will be slow on advancing diet until she has return of bowel function  LOS: 2 days    Lodema Pilot DAVID 03/21/2013

## 2013-03-21 NOTE — Progress Notes (Signed)
Patient oxygen level remain at 93% respirations 20, with continued wheeze.  MD paged again by answering service. Orders received

## 2013-03-22 LAB — CBC WITH DIFFERENTIAL/PLATELET
Basophils Absolute: 0.1 10*3/uL (ref 0.0–0.1)
Basophils Relative: 0 % (ref 0–1)
Eosinophils Absolute: 0.1 10*3/uL (ref 0.0–0.7)
Eosinophils Relative: 1 % (ref 0–5)
HCT: 36.2 % (ref 36.0–46.0)
Hemoglobin: 11.5 g/dL — ABNORMAL LOW (ref 12.0–15.0)
Lymphocytes Relative: 20 % (ref 12–46)
Lymphs Abs: 2.4 10*3/uL (ref 0.7–4.0)
MCH: 29.9 pg (ref 26.0–34.0)
MCHC: 31.8 g/dL (ref 30.0–36.0)
MCV: 94.3 fL (ref 78.0–100.0)
Monocytes Absolute: 1.3 10*3/uL — ABNORMAL HIGH (ref 0.1–1.0)
Monocytes Relative: 11 % (ref 3–12)
Neutro Abs: 8 10*3/uL — ABNORMAL HIGH (ref 1.7–7.7)
Neutrophils Relative %: 68 % (ref 43–77)
Platelets: 237 10*3/uL (ref 150–400)
RBC: 3.84 MIL/uL — ABNORMAL LOW (ref 3.87–5.11)
RDW: 14.9 % (ref 11.5–15.5)
WBC: 11.7 10*3/uL — ABNORMAL HIGH (ref 4.0–10.5)

## 2013-03-22 LAB — GLUCOSE, CAPILLARY
Glucose-Capillary: 108 mg/dL — ABNORMAL HIGH (ref 70–99)
Glucose-Capillary: 117 mg/dL — ABNORMAL HIGH (ref 70–99)

## 2013-03-22 MED ORDER — LEVALBUTEROL HCL 0.63 MG/3ML IN NEBU
0.6300 mg | INHALATION_SOLUTION | Freq: Three times a day (TID) | RESPIRATORY_TRACT | Status: DC
  Administered 2013-03-22 – 2013-03-25 (×8): 0.63 mg via RESPIRATORY_TRACT
  Filled 2013-03-22 (×13): qty 3

## 2013-03-22 NOTE — Progress Notes (Signed)
General Surgery Note  LOS: 3 days  POD -   3 Days Post-Op  Assessment/Plan: 1.   OPEN VENTRAL HERNIA REPAIR WITH MESH AND APPLICATION OF WOUND VAC, INSERTION OF MESH, APPLICATION OF WOUND VAC - 03/19/2013 - B. [facility]ton.  Wound with VAC  Drains still putting out > 50 cc/day  Taking little clear liquids.  Needs to ambulate, but desats some when she walks.  2.  Smokes - though she said she quit about 90 days ago 3.  DVT prophylaxis - Lovenox 4.  Question diabetes - no on anything at home  Will stop CBG's - all blood sugars below 120  Subjective:  Husband in room.  Sore.  Passed flatus, no BM. Objective:   Filed Vitals:   03/22/13 0824  BP:   Pulse:   Temp:   Resp: 15     Intake/Output from previous day:  09/05 0701 - 09/06 0700 In: 958 [P.O.:120; I.V.:838] Out: 1640 [Urine:1450; Drains:190]  Intake/Output this shift:  Total I/O In: -  Out: 600 [Urine:600]   Physical Exam:   General: Obese WF who is alert and oriented.    HEENT: Normal. Pupils equal. .   Lungs: Clear. IS - 1,200 cc   Abdomen: Rare BS.   Wound: Midline VAC. 2 drains - 1/2 -90/100 cc recorded yesterday   Lab Results:    Recent Labs  03/21/13 0415 03/22/13 0500  WBC 14.2* 11.7*  HGB 11.9* 11.5*  HCT 38.1 36.2  PLT 244 237    BMET   Recent Labs  03/20/13 0400 03/21/13 0415  NA 138 137  K 4.3 4.2  CL 103 102  CO2 26 30  GLUCOSE 150* 114*  BUN 10 9  CREATININE 0.56 0.57  CALCIUM 8.9 8.7    PT/INR  No results found for this basename: LABPROT, INR,  in the last 72 hours  ABG  No results found for this basename: PHART, PCO2, PO2, HCO3,  in the last 72 hours   Studies/Results:  Dg Chest 2 View  03/21/2013   *RADIOLOGY REPORT*  Clinical Data: Shortness of breath.  CHEST - 2 VIEW  Comparison: 03/12/2013.  Findings: The heart is borderline enlarged.  The mediastinal and hilar contours are within normal limits and stable.  The lungs are clear.  No pleural effusion.  The bony thorax is intact.   IMPRESSION: Borderline cardiac enlargement. No acute pulmonary findings.   Original Report Authenticated By: Rudie Meyer, M.D.     Anti-infectives:   Anti-infectives   Start     Dose/Rate Route Frequency Ordered Stop   03/19/13 2200  ceFAZolin (ANCEF) IVPB 2 g/50 mL premix     2 g 100 mL/hr over 30 Minutes Intravenous 3 times per day 03/19/13 1759 03/20/13 1506   03/19/13 1015  ceFAZolin (ANCEF) IVPB 2 g/50 mL premix     2 g 100 mL/hr over 30 Minutes Intravenous On call to O.R. 03/19/13 1001 03/19/13 1300      Ovidio Kin, MD, FACS Pager: [phone],   [facility] Office: [phone] 03/22/2013

## 2013-03-23 NOTE — Progress Notes (Signed)
General Surgery Note  LOS: 4 days  POD -   4 Days Post-Op  Assessment/Plan: 1.   OPEN VENTRAL HERNIA REPAIR WITH MESH AND APPLICATION OF WOUND VAC, INSERTION OF MESH, APPLICATION OF WOUND VAC - 03/19/2013 - B. [facility].  Wound with VAC  Drains still putting out aournd 50 cc/day  To advance diet.  Still desats to around 80% when walking  2.  Smokes - though she said she quit about 90 days ago 3.  DVT prophylaxis - Lovenox 4.  Low O2 sats  Part of this is probably chronic and she'll need an outpatient eval when discharged.  She'll need to discuss this with her PCP.  Subjective:  Husband in room.  Passed flatus, no BM.  Still not ready to go home.  But ready to try some regular food.  Objective:   Filed Vitals:   03/23/13 0602  BP: 125/82  Pulse: 93  Temp: 98.6 F (37 C)  Resp: 18     Intake/Output from previous day:  09/06 0701 - 09/07 0700 In: 2751.3 [P.O.:60; I.V.:2691.3] Out: 2600 [Urine:2500; Drains:100]  Intake/Output this shift:  Total I/O In: -  Out: 400 [Urine:400]   Physical Exam:   General: Obese WF who is alert and oriented.    HEENT: Normal. Pupils equal. .   Lungs: Clear.   Abdomen: Has BS.   Wound: Midline VAC. 2 drains - 1/2 - 55/45 cc recorded yesterday   Lab Results:     Recent Labs  03/21/13 0415 03/22/13 0500  WBC 14.2* 11.7*  HGB 11.9* 11.5*  HCT 38.1 36.2  PLT 244 237    BMET    Recent Labs  03/21/13 0415  NA 137  K 4.2  CL 102  CO2 30  GLUCOSE 114*  BUN 9  CREATININE 0.57  CALCIUM 8.7    PT/INR  No results found for this basename: LABPROT, INR,  in the last 72 hours  ABG  No results found for this basename: PHART, PCO2, PO2, HCO3,  in the last 72 hours   Studies/Results:  No results found.   Anti-infectives:   Anti-infectives   Start     Dose/Rate Route Frequency Ordered Stop   03/19/13 2200  ceFAZolin (ANCEF) IVPB 2 g/50 mL premix     2 g 100 mL/hr over 30 Minutes Intravenous 3 times per day 03/19/13 1759  03/20/13 1506   03/19/13 1015  ceFAZolin (ANCEF) IVPB 2 g/50 mL premix     2 g 100 mL/hr over 30 Minutes Intravenous On call to O.R. 03/19/13 1001 03/19/13 1300      Ovidio Kin, MD, FACS Pager: [phone],   [facility]: [phone] 03/23/2013

## 2013-03-24 NOTE — Progress Notes (Signed)
[facility]  [facility] is providing the following services: Oxygen  If patient discharges after hours, please call [phone].   Renard Hamper 03/24/2013, 9:38 AM

## 2013-03-24 NOTE — Progress Notes (Signed)
RT has order for evaluate for home oxygen. RN notified that RT does not do that order, that is a PT roll.

## 2013-03-24 NOTE — Progress Notes (Signed)
5 Days Post-Op  Subjective: Tolerating regular diet.  No BM  Objective: Vital signs in last 24 hours: Temp:  [98 F (36.7 C)-98.2 F (36.8 C)] 98 F (36.7 C) (09/08 0520) Pulse Rate:  [81-88] 81 (09/08 0520) Resp:  [16-18] 18 (09/08 0520) BP: (113-135)/(75-82) 127/81 mmHg (09/08 0520) SpO2:  [92 %-96 %] 95 % (09/08 0520) Last BM Date: 03/18/13  Intake/Output from previous day: 09/07 0701 - 09/08 0700 In: 2280 [P.O.:480; I.V.:1800] Out: 443 [Urine:400; Drains:43] Intake/Output this shift:    General appearance: alert, cooperative and no distress Resp: wheezing bilat Cardio: normal rate, regular GI: soft, appropriate tenderness, ND, wound okay, incisional vac in place, JP's decreasing output, ss  Extremities: SCD's bilat  Lab Results:   Recent Labs  03/22/13 0500  WBC 11.7*  HGB 11.5*  HCT 36.2  PLT 237   BMET No results found for this basename: NA, K, CL, CO2, GLUCOSE, BUN, CREATININE, CALCIUM,  in the last 72 hours PT/INR No results found for this basename: LABPROT, INR,  in the last 72 hours ABG No results found for this basename: PHART, PCO2, PO2, HCO3,  in the last 72 hours  Studies/Results: No results found.  Anti-infectives: Anti-infectives   Start     Dose/Rate Route Frequency Ordered Stop   03/19/13 2200  ceFAZolin (ANCEF) IVPB 2 g/50 mL premix     2 g 100 mL/hr over 30 Minutes Intravenous 3 times per day 03/19/13 1759 03/20/13 1506   03/19/13 1015  ceFAZolin (ANCEF) IVPB 2 g/50 mL premix     2 g 100 mL/hr over 30 Minutes Intravenous On call to O.R. 03/19/13 1001 03/19/13 1300      Assessment/Plan: s/p Procedure(s):  OPEN VENTRAL HERNIA REPAIR WITH MESH AND APPLICATION OF WOUND VAC (N/A) INSERTION OF MESH (N/A) APPLICATION OF WOUND VAC she seems to be doing okay from her hernia.  she is tolerating regular diet and pain controlled.  wound vac off tomorrow and drains likely out tomorrow as well.  Evaluate for possible need for home oxygen and  probable discharge to home tomorrow.  LOS: 5 days    Lodema Pilot DAVID 03/24/2013

## 2013-03-24 NOTE — Care Management Note (Signed)
    Page 1 of 2   03/24/2013     12:27:24 PM   CARE MANAGEMENT NOTE 03/24/2013  Patient:  Deborah [name], Deborah [name]   Account Number:  [id number]  Date Initiated:  03/20/2013  Documentation initiated by:  Lorenda Ishihara  Subjective/Objective Assessment:   57 yo female admitted s/p exploratory laparotomy with ventral hernia repair with mesh and incisional wound VAC placement. PTA lived at home alone.     Action/Plan:   Home when stable   Anticipated DC Date:  03/25/2013   Anticipated DC Plan:  HOME W HOME HEALTH SERVICES      DC Planning Services  CM consult      PAC Choice  DURABLE MEDICAL EQUIPMENT  HOME HEALTH   Choice offered to / List presented to:  C-1 Patient   DME arranged  OXYGEN      DME agency  [facility]     [facility] arranged  HH-1 RN      [facility] agency  [facility]   Status of service:  Completed, signed off Medicare Important Message given?   (If response is "NO", the following Medicare IM given date fields will be blank) Date Medicare IM given:   Date Additional Medicare IM given:    Discharge Disposition:  HOME W HOME HEALTH SERVICES  Per UR Regulation:  Reviewed for med. necessity/level of care/duration of stay  If discussed at Long Length of Stay Meetings, dates discussed:    Comments:

## 2013-03-24 NOTE — Progress Notes (Signed)
SATURATION QUALIFICATIONS: (This note is used to comply with regulatory documentation for home oxygen)  Patient Saturations on Room Air at Rest = 87%  Patient Saturations on Room Air while Ambulating = 82%  Patient Saturations on 2 Liters of oxygen while Ambulating = 92%  Please briefly explain why patient needs home oxygen:  Patient will require home oxygen in order to adequately maintain oxygen saturations.  Philomena Doheny RN

## 2013-03-25 MED ORDER — ALBUTEROL SULFATE HFA 108 (90 BASE) MCG/ACT IN AERS: 2.0000 | INHALATION_SPRAY | Freq: Four times a day (QID) | RESPIRATORY_TRACT | Status: DC | PRN

## 2013-03-25 MED ORDER — OXYCODONE-ACETAMINOPHEN 5-325 MG PO TABS: 1.0000 | ORAL_TABLET | ORAL | Status: DC | PRN

## 2013-03-25 NOTE — Discharge Summary (Signed)
  Physician Discharge Summary  Patient ID: Deborah [name] MRN: [id number] DOB/AGE: 55-29-1957 55 y.o.  Admit date: 03/19/2013 Discharge date: 03/25/2013  Admission Diagnoses:ventral hernia  Discharge Diagnoses: same Active Problems:   * No active hospital problems. *   Discharged Condition: stable  Hospital Course: to OR 03/19/13 for open ventral hernia repair with mesh.  No apparent complications.  Diet was slowly advanced and Deborah [name] tolerated this well but Deborah [name] had issues getting off of the oxygen especially with ambulation.  Home oxygen was recommended.  On pod 5 wound vac removed and drains removed and Deborah [name] was stable for discharge on pod 5.    Consults: None  Significant Diagnostic Studies: none  Treatments: surgery: 03/19/13 open ventral hernia repair with mesh   Disposition: 01-Home or Self Care  Discharge Orders   Future Appointments Provider Department Dept Phone   04/04/2013 9:00 AM Lodema Pilot, DO [facility], [place] [phone]   Future Orders Complete By Expires   Call MD for:  difficulty breathing, headache or visual disturbances  As directed    Call MD for:  persistant nausea and vomiting  As directed    Call MD for:  redness, tenderness, or signs of infection (pain, swelling, redness, odor or green/yellow discharge around incision site)  As directed    Call MD for:  severe uncontrolled pain  As directed    Call MD for:  temperature >100.4  As directed    Diet - low sodium heart healthy  As directed    Discharge instructions  As directed    Comments:     Call [phone] to schedule follow up appointment and staple removal with Dr. Biagio Quint Call your primary doctor to ask about resuming your Chantix or the protocol for stopping this if indicated. No lifting more than 10lbs for 4 weeks. May shower tomorrow Leave glue in place until it falls off.   Increase activity slowly  As directed        Medication List    STOP taking these medications       acetaminophen 650 MG CR tablet  Commonly known as:  TYLENOL      TAKE these medications       albuterol 108 (90 BASE) MCG/ACT inhaler  Commonly known as:  PROVENTIL HFA;VENTOLIN HFA  Inhale 2 puffs into the lungs every 6 (six) hours as needed for wheezing.     oxyCODONE-acetaminophen 5-325 MG per tablet  Commonly known as:  PERCOCET/ROXICET  Take 1 tablet by mouth every 4 (four) hours as needed for pain.     varenicline 1 MG tablet  Commonly known as:  CHANTIX  Take 1 mg by mouth 2 (two) times daily.         SignedLodema Pilot DAVID 03/25/2013, 11:19 AM

## 2013-03-25 NOTE — Progress Notes (Signed)
Patient discharged home. Verbalized understanding of all d/c instructions. Iv d/c'd cath intact.

## 2013-03-25 NOTE — Progress Notes (Signed)
6 Days Post-Op  Subjective: Pain controlled.  Tolerating diet.  +flatus, no BM  Objective: Vital signs in last 24 hours: Temp:  [97.9 F (36.6 C)-98.9 F (37.2 C)] 97.9 F (36.6 C) (09/09 0615) Pulse Rate:  [77-92] 77 (09/09 0615) Resp:  [20] 20 (09/09 0615) BP: (106-147)/(70-87) 125/84 mmHg (09/09 0615) SpO2:  [94 %-97 %] 95 % (09/09 0615) Last BM Date: 03/18/13  Intake/Output from previous day: 09/08 0701 - 09/09 0700 In: 1240 [P.O.:1240] Out: 90 [Drains:90] Intake/Output this shift: Total I/O In: 240 [P.O.:240] Out: 0   General appearance: alert, cooperative and no distress Resp: bilat wheezing, on 2L  Cardio: normal rate, regular GI: soft, minimal tenderness, ND, incision without infection, wound vac removed.  JP's with mostly serous output and removed today Extremities: SCD's bilat  Lab Results:  No results found for this basename: WBC, HGB, HCT, PLT,  in the last 72 hours BMET No results found for this basename: NA, K, CL, CO2, GLUCOSE, BUN, CREATININE, CALCIUM,  in the last 72 hours PT/INR No results found for this basename: LABPROT, INR,  in the last 72 hours ABG No results found for this basename: PHART, PCO2, PO2, HCO3,  in the last 72 hours  Studies/Results: No results found.  Anti-infectives: Anti-infectives   Start     Dose/Rate Route Frequency Ordered Stop   03/19/13 2200  ceFAZolin (ANCEF) IVPB 2 g/50 mL premix     2 g 100 mL/hr over 30 Minutes Intravenous 3 times per day 03/19/13 1759 03/20/13 1506   03/19/13 1015  ceFAZolin (ANCEF) IVPB 2 g/50 mL premix     2 g 100 mL/hr over 30 Minutes Intravenous On call to O.R. 03/19/13 1001 03/19/13 1300      Assessment/Plan: s/p Procedure(s):  OPEN VENTRAL HERNIA REPAIR WITH MESH AND APPLICATION OF WOUND VAC (N/A) INSERTION OF MESH (N/A) APPLICATION OF WOUND VAC She is looking good.  abdomen is soft and wounds look okay.  She still is having borderline drainage from her drain on the mesh but I  removed it today so it would not risk introducing infection at the mesh while at home.  she looks fine and should be okay for discharge to home with home oxygen  LOS: 6 days    Lodema Pilot DAVID 03/25/2013

## 2013-04-03 ENCOUNTER — Encounter (INDEPENDENT_AMBULATORY_CARE_PROVIDER_SITE_OTHER): Payer: BC Managed Care – PPO | Admitting: General Surgery

## 2013-04-04 ENCOUNTER — Encounter (INDEPENDENT_AMBULATORY_CARE_PROVIDER_SITE_OTHER): Payer: Self-pay | Admitting: General Surgery

## 2013-04-04 ENCOUNTER — Ambulatory Visit (INDEPENDENT_AMBULATORY_CARE_PROVIDER_SITE_OTHER): Payer: BC Managed Care – PPO | Admitting: General Surgery

## 2013-04-04 VITALS — BP 138/82 | HR 96 | Temp 98.2°F | Resp 15 | Ht 62.0 in | Wt 244.4 lb

## 2013-04-04 DIAGNOSIS — Z4889 Encounter for other specified surgical aftercare: Secondary | ICD-10-CM

## 2013-04-04 DIAGNOSIS — Z5189 Encounter for other specified aftercare: Secondary | ICD-10-CM

## 2013-04-04 NOTE — Progress Notes (Signed)
Subjective:     Patient ID: Deborah Deborah [name], female   DOB: 1956-01-20, 57 y.o.   MRN: [id number]  HPI This patient follows up to a half weeks status post open retrorectus repair of a large umbilical ventral hernia with mesh. She continues to be smoke free. She remains on oxygen as given in the hospital. She still has not followed up with her primary care physician regarding stopping the Chantix and getting her off of the home oxygen. She has no known pain and her only pain is in her back which is chronic. She is taking her pain medicine for her back pain but not for abdominal pain. She has no nausea or vomiting and her bowels are functioning although she says that recently has been more constipated.  Review of Systems     Objective:   Physical Exam No acute distress and nontoxic-appearing on oxygen Her abdomen is soft and nontender on exam her incision is healing Deborah [name] without any sign of infection I removed her staples today and applied some Steri-Strips and benzoin. There is no evidence of recurrent hernia and she has good cosmesis without any notable seroma. She does have some dimpling at some of the transfacial sutures sites but this is fairly minimal as well    Assessment:     Status post open ventral hernia repair with mesh-doing well She's doing very well and has no complaints. I recommended that she followup with her primary care physician regarding stopping the Chantix and getting her off of the home oxygen. She can gradually increase her activity as tolerated although I recommended another 2 weeks of no lifting and at that time she came progress as tolerated.     Plan:     Continuous 2 weeks light-duty and increase activity and diet as tolerated. She will followup with her primary care physician regarding her back pain, Chantix, and oxygen.  She will followup with me in 2 months as needed for any issues

## 2013-04-08 ENCOUNTER — Telehealth (INDEPENDENT_AMBULATORY_CARE_PROVIDER_SITE_OTHER): Payer: Self-pay | Admitting: General Surgery

## 2013-04-08 ENCOUNTER — Telehealth: Payer: Self-pay

## 2013-04-08 NOTE — Telephone Encounter (Signed)
Heather, nurse with [facility], called to report a small piece of tissue "dehissed" from umbilical incision; measures 0.3 x 0.1. Will dress and watch closely.

## 2013-04-08 NOTE — Telephone Encounter (Signed)
Is she symptomatic?  If so, I would like for her to come in to see another provider today.

## 2013-04-08 NOTE — Telephone Encounter (Signed)
Heather with [facility] left v/m; pt's BP today is 165/102 P 78. Pt already has appt scheduled with Dr Dayton Martes on 04/09/13. Herbert Seta said other than elevated BP pt had negative assessment, no H/A, CP or SOB.Herbert Seta was not sure if pt needed to be seen sooner.Please advise.

## 2013-04-09 ENCOUNTER — Ambulatory Visit (INDEPENDENT_AMBULATORY_CARE_PROVIDER_SITE_OTHER): Payer: BC Managed Care – PPO | Admitting: Family Medicine

## 2013-04-09 ENCOUNTER — Encounter: Payer: Self-pay | Admitting: Family Medicine

## 2013-04-09 VITALS — BP 134/84 | HR 108 | Temp 98.1°F | Wt 246.5 lb

## 2013-04-09 DIAGNOSIS — R0600 Dyspnea, unspecified: Secondary | ICD-10-CM

## 2013-04-09 DIAGNOSIS — I10 Essential (primary) hypertension: Secondary | ICD-10-CM

## 2013-04-09 DIAGNOSIS — R0609 Other forms of dyspnea: Secondary | ICD-10-CM

## 2013-04-09 DIAGNOSIS — Z23 Encounter for immunization: Secondary | ICD-10-CM

## 2013-04-09 MED ORDER — FLUTICASONE-SALMETEROL 100-50 MCG/DOSE IN AEPB: 1.0000 | INHALATION_SPRAY | Freq: Two times a day (BID) | RESPIRATORY_TRACT | Status: DC

## 2013-04-09 MED ORDER — HYDROCHLOROTHIAZIDE 12.5 MG PO TABS: 12.5000 mg | ORAL_TABLET | Freq: Every day | ORAL | Status: DC

## 2013-04-09 NOTE — Addendum Note (Signed)
Addended by: Shon Millet on: 04/09/2013 11:47 AM   Modules accepted: Orders

## 2013-04-09 NOTE — Progress Notes (Signed)
Subjective:    Patient ID: Deborah [name], female    DOB: 08-Feb-1956, 57 y.o.   MRN: [id number]  Very pleasant 57 yo female with long history of previous tobacco abuse here for:  1.  ?HTN- home health nurse called yesterday- BP was 165/102.  Per pt, was elevated in hospital during her recent surgery as well.  She has had intermittent pain but felt like she was not in pain last few times it was elevated.   At times, gets a HA.  No dizziness or SOB.    BP Readings from Last 3 Encounters:  04/09/13 134/84  04/04/13 138/82  03/25/13 129/87    2.  S/p ventral hernia repair on 9/3.  Has been doing ok- still having to take narcotics at times for pain management.  Dr. Biagio Quint was her surgeon.    3.  ?COPD- h/o long term tobacco use.  Quit smoking in 12/2012. While in hospital, could not keep O2 sats above 90 so was sent home with O2 which she has not been using routinely. No worsening SOB. Given albuterol inhaler to use as needed, has not needed it yet.  Lab Results  Component Value Date   CHOL 185 12/13/2012   HDL 34.70* 12/13/2012   LDLCALC 120* 12/13/2012   TRIG 152.0* 12/13/2012   CHOLHDL 5 12/13/2012   Lab Results  Component Value Date   NA 137 03/21/2013   K 4.2 03/21/2013   CL 102 03/21/2013   CO2 30 03/21/2013     Patient Active Problem List   Diagnosis Date Noted  . Hypertension 04/09/2013  . Pre-op evaluation 02/11/2013  . Routine general medical examination at a health care facility 12/19/2012  . Tobacco abuse 12/04/2012  . Umbilical hernia    Past Medical History  Diagnosis Date  . Umbilical hernia   . Asthma   . Hypertension   . Anxiety   . Depression   . Shortness of breath   . Pneumonia     hx of  . Arthritis   . Anemia     as teen  . PONV (postoperative nausea and vomiting)   . Family history of anesthesia complication     vomiting  . Histoplasmosis     left eye   Past Surgical History  Procedure Laterality Date  . Cesarean section  04/12/85  . Ventral  hernia repair N/A 03/19/2013    Procedure:  OPEN VENTRAL HERNIA REPAIR WITH MESH AND APPLICATION OF WOUND VAC;  Surgeon: Lodema Pilot, DO;  Location: [facility];  Service: General;  Laterality: N/A;  . Insertion of mesh N/A 03/19/2013    Procedure: INSERTION OF MESH;  Surgeon: Lodema Pilot, DO;  Location: [facility];  Service: General;  Laterality: N/A;  . Application of wound vac  03/19/2013    Procedure: APPLICATION OF WOUND VAC;  Surgeon: Lodema Pilot, DO;  Location: [facility];  Service: General;;   History  Substance Use Topics  . Smoking status: Former Smoker -- 3.00 packs/day for 40 years    Types: Cigarettes    Quit date: 12/17/2012  . Smokeless tobacco: Never Used     Comment: No cigs since 12/17/12  . Alcohol Use: Yes     Comment: rare   Family History  Problem Relation Age of Onset  . Heart attack Mother    No Known Allergies Current Outpatient Prescriptions on File Prior to Visit  Medication Sig Dispense Refill  . albuterol (PROVENTIL HFA;VENTOLIN HFA) 108 (90 BASE) MCG/ACT inhaler Inhale  2 puffs into the lungs every 6 (six) hours as needed for wheezing.  1 Inhaler  2  . oxyCODONE-acetaminophen (PERCOCET/ROXICET) 5-325 MG per tablet Take 1 tablet by mouth every 4 (four) hours as needed for pain.  45 tablet  0  . varenicline (CHANTIX) 1 MG tablet Take 1 mg by mouth 2 (two) times daily.       No current facility-administered medications on file prior to visit.   The PMH, PSH, Social History, Family History, Medications, and allergies have been reviewed in [facility], and have been updated if relevant.   Review of Systems See HPI  Objective:   Physical Exam  BP 134/84  Pulse 108  Temp(Src) 98.1 F (36.7 C) (Oral)  Wt 246 lb 8 oz (111.812 kg)  BMI 45.07 kg/m2  SpO2 86%   General:  Well-developed,well-nourished,in no acute distress; alert,appropriate and cooperative throughout examination Head:  normocephalic and atraumatic.   Eyes:  vision grossly intact, pupils equal, pupils round, and  pupils reactive to light.   Ears:  R ear normal and L ear normal.   Nose:  no external deformity.   Mouth:  good dentition.   Neck:  No deformities, masses, or tenderness noted. Mouth/Throat: Abnormal dentition.  Resp:  CTA bilaterally Msk:  No deformity or scoliosis noted of thoracic or lumbar spine.   Extremities:  No clubbing, cyanosis, edema, or deformity noted with normal full range of motion of all joints.   Neurologic:  alert & oriented X3 and gait normal.   Skin:  Intact without suspicious lesions or rashes Cervical Nodes:  No lymphadenopathy noted Axillary Nodes:  No palpable lymphadenopathy Psych:  Cognition and judgment appear intact. Alert and cooperative with normal attention span and concentration. No apparent delusions, illusions, hallucinations     Assessment & Plan:  1. Hypertension Discussed with pt at length- normotensive now and has been for past few readings.  Has had intermittent elevated readings, including with home health nurse. Will start low dose HCTZ 12.5 mg daily- needs to watch for signs of hypotension. Follow up with me in 2 weeks or home health nurse to check and call me. The patient indicates understanding of these issues and agrees with the plan.   2. Dyspnea and respiratory abnormality Probably does have component of COPD given h/o long term tobacco use. Start Advair. Refer to pulm. - Ambulatory referral to Pulmonology

## 2013-04-09 NOTE — Patient Instructions (Addendum)
We are starting HCTZ (blood pressure pill)- 12.5 mg daily. Either come see me in 2 weeks or have home health nurse check it and call me.  We are starting Advair as discussed today and sending you to a lung doctor.  We will call you with a referral to a lung doctor.

## 2013-04-11 ENCOUNTER — Telehealth: Payer: Self-pay

## 2013-04-11 ENCOUNTER — Encounter (INDEPENDENT_AMBULATORY_CARE_PROVIDER_SITE_OTHER): Payer: BC Managed Care – PPO | Admitting: General Surgery

## 2013-04-11 MED ORDER — BLOOD PRESSURE CUFF MISC: Status: DC

## 2013-04-11 NOTE — Telephone Encounter (Signed)
  We just started HCTZ and it will take time.  Also, a lot of fluctuations seem pain related.  Ok to send order for BP cuff of her choice to pharmacy.

## 2013-04-11 NOTE — Telephone Encounter (Signed)
Heather notified. BP sent to [facility].

## 2013-04-11 NOTE — Telephone Encounter (Signed)
pt seen 04/09/13 by Dr Dayton Martes.

## 2013-04-11 NOTE — Telephone Encounter (Signed)
Heather with [facility] left v/m that pt was seen today and BP was 165/102; pt had not taken HCTZ  Today prior to BP checked. Pain level 8. Pt's oxygen was 88%-93% on room air while Herbert Seta was there. Heather request order for BP cuff sent to pt's pharmacy. Unable to reach [facility] for more pharmacy info.Please advise.Heather request cb.

## 2013-04-11 NOTE — Telephone Encounter (Signed)
Nurse Herbert Seta from [facility] called today to follow up from her call a couple days ago.  She was expecting a call back.  She states the pt's umbilical incision has dehisced.  There is still a small piece of tissue coming out.  There is some drainage.  She told the pt to keep it clean.  She would like to know how Dr Biagio Quint wants it dressed.  She also wants to get the pt certify the pt for continued visits.  She wants to go out at least once a week.  I spoke to Dr Biagio Quint who wants to see her this morning.  I called the nurse back and left her a message to call me.  I called the pt and asked her to come in today.  She had to call her husband because he has the car.  He is unable to get her.  He gets off work at [employer].  She has no one to bring her.  I advised her in the mean time to continue showering, and keep covered with a dry bandage.  She will call if there are any further problems.

## 2013-04-17 ENCOUNTER — Ambulatory Visit (INDEPENDENT_AMBULATORY_CARE_PROVIDER_SITE_OTHER): Payer: BC Managed Care – PPO | Admitting: General Surgery

## 2013-04-17 ENCOUNTER — Encounter: Payer: Self-pay | Admitting: Internal Medicine

## 2013-04-17 ENCOUNTER — Telehealth (INDEPENDENT_AMBULATORY_CARE_PROVIDER_SITE_OTHER): Payer: Self-pay

## 2013-04-17 ENCOUNTER — Ambulatory Visit (INDEPENDENT_AMBULATORY_CARE_PROVIDER_SITE_OTHER): Payer: BC Managed Care – PPO | Admitting: Internal Medicine

## 2013-04-17 ENCOUNTER — Encounter (INDEPENDENT_AMBULATORY_CARE_PROVIDER_SITE_OTHER): Payer: Self-pay | Admitting: General Surgery

## 2013-04-17 VITALS — BP 140/90 | HR 100 | Temp 98.6°F | Resp 16 | Ht 62.0 in | Wt 241.6 lb

## 2013-04-17 VITALS — BP 134/90 | HR 103 | Temp 98.0°F | Ht 62.0 in | Wt 244.0 lb

## 2013-04-17 DIAGNOSIS — J4489 Other specified chronic obstructive pulmonary disease: Secondary | ICD-10-CM

## 2013-04-17 DIAGNOSIS — J449 Chronic obstructive pulmonary disease, unspecified: Secondary | ICD-10-CM

## 2013-04-17 DIAGNOSIS — Z5189 Encounter for other specified aftercare: Secondary | ICD-10-CM

## 2013-04-17 DIAGNOSIS — Z4889 Encounter for other specified surgical aftercare: Secondary | ICD-10-CM

## 2013-04-17 DIAGNOSIS — R0600 Dyspnea, unspecified: Secondary | ICD-10-CM

## 2013-04-17 DIAGNOSIS — J9610 Chronic respiratory failure, unspecified whether with hypoxia or hypercapnia: Secondary | ICD-10-CM

## 2013-04-17 HISTORY — DX: Dyspnea, unspecified: R06.00

## 2013-04-17 MED ORDER — BUDESONIDE-FORMOTEROL FUMARATE 160-4.5 MCG/ACT IN AERO: INHALATION_SPRAY | RESPIRATORY_TRACT | Status: DC

## 2013-04-17 NOTE — Patient Instructions (Addendum)
No need for 02 sitting still Use 02 2lpm at bedtime and as needed during the day with exertion more than just room to room  Try off advair and take symbicort 160  Take 2 puffs first thing in am and then another 2 puffs about 12 hours later.   Please schedule a follow up office visit in 6 weeks, call sooner if needed for  pft's on return

## 2013-04-17 NOTE — Progress Notes (Signed)
Subjective:     Patient ID: Deborah [name], female   DOB: [birth year], 57 y.o.   MRN: [id number]  HPI This patient follows up 4 weeks status post open ventral hernia repair with mesh. We received a call last week that her wound had "dehisced" from her home health nurse. She was unable to come in at that time and she follows up today. She tells me that she's not sure why She needs to come in and because she has not been having any problems. She denies any drainage from her wound.  She has been eating regular diet and her bowels are functioning normally. She does have some sharp left-sided discomfort and pulling in her left abdomen is when she sits up but otherwise is doing well. She is not taking any pain medication. She also has come off her oxygen during the day and only uses it at night. She has seen a pulmonologist and he has made these recommendations for her.  Review of Systems     Objective:   Physical Exam No acute distress and nontoxic-appearing Her abdomen is soft and nontender exam and her incision is healing very [name] without any sign of infection. I do not see any evidence of wound dehiscence or even any granulation tissue or sign of infection. There is no evidence of recurrent hernia.    Assessment:     Status post open ventral hernia repair with mesh-doing well She seems to be doing very well for this point in her surgery and I recommended that she continue with light-duty until she is pain free. At that time she can gradually increase her activity as tolerated. I recommended that she increase activity as according to recommendations by her pulmonologist.     Plan:     Light duty until pain free then may increase activity per her pulmonologist

## 2013-04-17 NOTE — Telephone Encounter (Signed)
Patient has appointment today w/Dr. Biagio Quint

## 2013-04-17 NOTE — Telephone Encounter (Signed)
Patient has appointment with DR.Biagio Quint today @ 2, Heather with [facility] is asking how many times a week for patient dressing # [phone]

## 2013-04-17 NOTE — Progress Notes (Signed)
  Subjective:    Patient ID: Deborah [name], female    DOB: [birth year]   MRN: [id number]  HPI  72 yowf quit smoking 12/2012 with onset of doe x 2008  assoc with pattern of recurrent "bronchitis" up to 4 x per year much better since quit smoking but breathing no better and newly dx'd with desat post op from umbilical hernia surgery placed on 02 at 2lpm and referred by Dr Dayton Martes 04/16/13 to pulmonary clinic.   04/17/2013 1st [facility] office visit/ Deborah [name]  Chief Complaint  Patient presents with  . Pulmonary Consult    Referred per Dr. Ruthe Mannan.  Pt c/o SOB x 6 yrs. She gets SOB with minimal exertion such as walking from room to room at home. She had recent hernia repair surgery 03/19/13 and was found to have o2 sats in the 80's.    cc progressive doe x  Years indolent onset much worse during exac with cough/" colds" rx with advair some better x one week prior to OV   but still needing albuterol sev times a day for relief of sob - cough is presently dry but with exac gets thicker and darker  Very inactive since surgery   No obvious day to day or daytime variabilty or assoc  cp or chest tightness, subjective wheeze overt sinus or hb symptoms. No unusual exp hx or h/o childhood pna/ asthma or knowledge of premature birth.  Sleeping ok without nocturnal  or early am exacerbation  of respiratory  c/o's or need for noct saba. Also denies any obvious fluctuation of symptoms with weather or environmental changes or other aggravating or alleviating factors except as outlined above   Current Medications, Allergies, Complete Past Medical History, Past Surgical History, Family History, and Social History were reviewed in [employer] record.          Review of Systems  Constitutional: Negative for fever, chills and unexpected weight change.  HENT: Negative for ear pain, nosebleeds, congestion, sore throat, rhinorrhea, sneezing, trouble swallowing, dental problem, voice change,  postnasal drip and sinus pressure.   Eyes: Negative for visual disturbance.  Respiratory: Positive for cough and shortness of breath. Negative for choking.   Cardiovascular: Negative for chest pain and leg swelling.  Gastrointestinal: Negative for vomiting, abdominal pain and diarrhea.  Genitourinary: Negative for difficulty urinating.  Musculoskeletal: Positive for arthralgias.  Skin: Negative for rash.  Neurological: Negative for tremors, syncope and headaches.  Hematological: Does not bruise/bleed easily.       Objective:   Physical Exam  amb obese wf nad with hoarse voice and mild pseudowheeze Wt Readings from Last 3 Encounters:  04/17/13 244 lb (110.678 kg)  04/09/13 246 lb 8 oz (111.812 kg)  04/04/13 244 lb 6.4 oz (110.859 kg)     HEENT mild turbinate edema.  Oropharynx no thrush or excess pnd or cobblestoning.  No JVD or cervical adenopathy. Mild accessory muscle hypertrophy. Trachea midline, nl thryroid. Chest was hyperinflated by percussion with diminished breath sounds and moderate increased exp time without wheeze. Hoover sign positive at mid inspiration. Regular rate and rhythm without murmur gallop or rub or increase P2 or edema.  Abd: no hsm, nl excursion. Ext warm without cyanosis or clubbing.    cxr 03/21/13  Borderline cardiac enlargement.  No acute pulmonary findings.       Assessment & Plan:

## 2013-04-18 ENCOUNTER — Encounter: Payer: Self-pay | Admitting: Internal Medicine

## 2013-04-18 NOTE — Assessment & Plan Note (Addendum)
DDX of  difficult airways managment all start with A and  include Adherence, Ace Inhibitors, Acid Reflux, Active Sinus Disease, Alpha 1 Antitripsin deficiency, Anxiety masquerading as Airways dz,  ABPA,  allergy(esp in young), Aspiration (esp in elderly), Adverse effects of DPI,  Active smokers, plus two Bs  = Bronchiectasis and Beta blocker use..and one C= CHF  Adherence is always the initial "prime suspect" and is a multilayered concern that requires a "trust but verify" approach in every patient - starting with knowing how to use medications, especially inhalers, correctly, keeping up with refills and understanding the fundamental difference between maintenance and prns vs those medications only taken for a very short course and then stopped and not refilled. The proper method of use, as well as anticipated side effects, of a metered-dose inhaler are discussed and demonstrated to the patient. Improved effectiveness after extensive coaching during this visit to a level of approximately  75% so try symbicort 160 2bid as still needing saba on advair and lots of coughing  ? Adverse effect of dpi > clearly not the case as this has been going on for years so if not convinced symbicort better then p 2 weeks of samples should resume advair  ? Active smoking > denies and emphasized how impt it was to stay off cigs if wants to come off 02 some day  See instructions for specific recommendations which were reviewed directly with the patient who was given a copy with highlighter outlining the key components.

## 2013-04-18 NOTE — Assessment & Plan Note (Signed)
-   04/17/2013  Walked RA x 1 laps @ 185 ft each stopped due to  desat 86% rapid pace  From resting sat 94%  Likely longstanding and worse post op from abd surgery but for now should stay on 02 at hs and walking more than room to room

## 2013-04-21 ENCOUNTER — Telehealth (INDEPENDENT_AMBULATORY_CARE_PROVIDER_SITE_OTHER): Payer: Self-pay

## 2013-04-21 NOTE — Telephone Encounter (Signed)
Heather w/[facility] is asking if Dr. Biagio Quint is wanting to continue weekly dressing changes or does he want to D/C dressing changes please advise Heather @ [phone]

## 2013-04-21 NOTE — Telephone Encounter (Signed)
See note below

## 2013-04-23 ENCOUNTER — Ambulatory Visit (INDEPENDENT_AMBULATORY_CARE_PROVIDER_SITE_OTHER): Payer: BC Managed Care – PPO | Admitting: Family Medicine

## 2013-04-23 ENCOUNTER — Encounter: Payer: Self-pay | Admitting: Family Medicine

## 2013-04-23 VITALS — BP 134/86 | HR 94 | Temp 97.6°F | Wt 246.5 lb

## 2013-04-23 DIAGNOSIS — J449 Chronic obstructive pulmonary disease, unspecified: Secondary | ICD-10-CM

## 2013-04-23 DIAGNOSIS — I10 Essential (primary) hypertension: Secondary | ICD-10-CM

## 2013-04-23 DIAGNOSIS — J4489 Other specified chronic obstructive pulmonary disease: Secondary | ICD-10-CM

## 2013-04-23 DIAGNOSIS — K429 Umbilical hernia without obstruction or gangrene: Secondary | ICD-10-CM

## 2013-04-23 MED ORDER — HYDROCHLOROTHIAZIDE 12.5 MG PO TABS: 12.5000 mg | ORAL_TABLET | Freq: Every day | ORAL | Status: DC

## 2013-04-23 MED ORDER — VARENICLINE TARTRATE 1 MG PO TABS: 1.0000 mg | ORAL_TABLET | Freq: Two times a day (BID) | ORAL | Status: DC

## 2013-04-23 NOTE — Progress Notes (Signed)
Subjective:    Patient ID: Deborah [name], female    DOB: [birth year], 57 y.o.   MRN: [id number]  Very pleasant 57 yo female with long history of previous tobacco abuse here for follow up HTN.  1.  HTN- home health nurse called two weeks ago,  BP was 165/102.    She has had intermittent pain but felt like she was not in pain last few times it was elevated.   At times, was getting HA when BP elevated.  No dizziness or SOB.  Followed up in my office on 9/24 and BP was normal.  Since she was symptomatic with elevations in BP, I started low dose HCTZ -12.5 mg daily and she is here to follow this up.  today.  Feels better. Checking BPs at home now- ranging in 120s-130s/80s- 90s.  HAs have resolving.  BP Readings from Last 3 Encounters:  04/23/13 134/86  04/17/13 140/90  04/17/13 134/90     2.  S/p ventral hernia repair on 9/3.  Has been doing ok- very rarely taking narcotics at this point. Dr. Biagio Quint was her surgeon.    3.  ?COPD- h/o long term tobacco use.  Quit smoking in 12/2012. While in hospital, could not keep O2 sats above 90 so was sent home with O2 which she has not been using routinely. Now only using it when she is sleeping.  No worsening SOB. Saw Dr. Sherene Sires on 10/2- note reviewed.      Patient Active Problem List   Diagnosis Date Noted  . COPD pfts pending 04/17/2013  . Chronic respiratory failure 04/17/2013  . Hypertension 04/09/2013  . Pre-op evaluation 02/11/2013  . Routine general medical examination at a health care facility 12/19/2012  . Umbilical hernia    Past Medical History  Diagnosis Date  . Umbilical hernia   . Asthma   . Hypertension   . Anxiety   . Depression   . Shortness of breath   . Pneumonia     hx of  . Arthritis   . Anemia     as teen  . PONV (postoperative nausea and vomiting)   . Family history of anesthesia complication     vomiting  . Histoplasmosis     left eye   Past Surgical History  Procedure Laterality Date  . Cesarean  section  04/12/85  . Ventral hernia repair N/A 03/19/2013    Procedure:  OPEN VENTRAL HERNIA REPAIR WITH MESH AND APPLICATION OF WOUND VAC;  Surgeon: Lodema Pilot, DO;  Location: [facility];  Service: General;  Laterality: N/A;  . Insertion of mesh N/A 03/19/2013    Procedure: INSERTION OF MESH;  Surgeon: Lodema Pilot, DO;  Location: [facility];  Service: General;  Laterality: N/A;  . Application of wound vac  03/19/2013    Procedure: APPLICATION OF WOUND VAC;  Surgeon: Lodema Pilot, DO;  Location: [facility];  Service: General;;   History  Substance Use Topics  . Smoking status: Former Smoker -- 3.00 packs/day for 40 years    Types: Cigarettes    Quit date: 12/17/2012  . Smokeless tobacco: Never Used     Comment: No cigs since 12/17/12  . Alcohol Use: Yes     Comment: rare   Family History  Problem Relation Age of Onset  . Heart attack Mother   . Emphysema Mother     was a smoker  . Lymphoma Maternal Uncle   . Pancreatic cancer Maternal Aunt    No Known Allergies Current  Outpatient Prescriptions on File Prior to Visit  Medication Sig Dispense Refill  . Aspirin-Salicylamide-Caffeine (ARTHRITIS STRENGTH BC POWDER PO) as needed.      . budesonide-formoterol (SYMBICORT) 160-4.5 MCG/ACT inhaler Take 2 puffs first thing in am and then another 2 puffs about 12 hours later.  1 Inhaler  12  . hydrochlorothiazide (HYDRODIURIL) 12.5 MG tablet Take 1 tablet (12.5 mg total) by mouth daily.  90 tablet  3  . varenicline (CHANTIX) 1 MG tablet Take 1 mg by mouth 2 (two) times daily.       No current facility-administered medications on file prior to visit.   The PMH, PSH, Social History, Family History, Medications, and allergies have been reviewed in [facility], and have been updated if relevant.   Review of Systems See HPI  Objective:   Physical Exam  BP 134/86  Pulse 94  Temp(Src) 97.6 F (36.4 C) (Oral)  Wt 246 lb 8 oz (111.812 kg)  BMI 45.07 kg/m2  SpO2 93% General:  Well-developed,well-nourished,in no  acute distress; alert,appropriate and cooperative throughout examination Head:  normocephalic and atraumatic.   Eyes:  vision grossly intact, pupils equal, pupils round, and pupils reactive to light.   Ears:  R ear normal and L ear normal.   Nose:  no external deformity.   Mouth:  good dentition.   Neck:  No deformities, masses, or tenderness noted. Mouth/Throat: Abnormal dentition.  Resp:  CTA bilaterally Msk:  No deformity or scoliosis noted of thoracic or lumbar spine.   Extremities:  No clubbing, cyanosis, edema, or deformity noted with normal full range of motion of all joints.   Neurologic:  alert & oriented X3 and gait normal.   Skin:  Intact without suspicious lesions or rashes Psych:  Cognition and judgment appear intact. Alert and cooperative with normal attention span and concentration. No apparent delusions, illusions, hallucinations     Assessment & Plan:   1. Hypertension Stable on low dose HCTZ. No changes today- rx refilled.  2. COPD pfts pending Continue inhalers.  Refilled continuing pack of chantix as she is still working on smoking cessation.  3. Umbilical hernia Recovering well.  Followed by Dr. Biagio Quint.

## 2013-04-23 NOTE — Patient Instructions (Signed)
Good to see you. Let's continue current dose of HCTZ (blood pressure medication). Call me if your blood pressure goes up.

## 2013-04-25 ENCOUNTER — Other Ambulatory Visit: Payer: Self-pay | Admitting: Family Medicine

## 2013-04-25 NOTE — Telephone Encounter (Signed)
Deborah [name] states she has not been told if wound care needs to continue or to D/C please call [phone]

## 2013-04-25 NOTE — Telephone Encounter (Addendum)
Called and spoke to [place], Charity fundraiser for [employer].  Made Heather aware that per Dr. Delice Lesch last office note it indicates that patient is doing well and to continue with Light duty until pain free then may increase activity per her pulmonologist.

## 2013-05-09 ENCOUNTER — Telehealth: Payer: Self-pay | Admitting: Internal Medicine

## 2013-05-09 NOTE — Telephone Encounter (Signed)
Called and spoke with pt and she is aware of MW recs.  She will call back on Monday for an appt if not better.

## 2013-05-09 NOTE — Telephone Encounter (Signed)
Try one puff bid to see if better over weekend and call back Monday 10/27 to be seen if not better

## 2013-05-09 NOTE — Telephone Encounter (Signed)
Called and spoke with pt and she stated that she started on symbicort on 04/17/13.  She stated that since she has started on this medication that she has been having muscle cramps all over, the worse ones are on the inside of her upper thighs.  She stated that she also gets these in her hands.  She stated that she has not been able to sleep good since starting on the symbicort.  MW please advise. Thanks  No Known Allergies

## 2013-06-04 ENCOUNTER — Encounter (INDEPENDENT_AMBULATORY_CARE_PROVIDER_SITE_OTHER): Payer: Self-pay | Admitting: General Surgery

## 2013-06-04 ENCOUNTER — Ambulatory Visit (INDEPENDENT_AMBULATORY_CARE_PROVIDER_SITE_OTHER): Payer: BC Managed Care – PPO | Admitting: General Surgery

## 2013-06-04 VITALS — BP 158/82 | HR 80 | Resp 20 | Ht 62.0 in | Wt 248.2 lb

## 2013-06-04 DIAGNOSIS — Z5189 Encounter for other specified aftercare: Secondary | ICD-10-CM

## 2013-06-04 DIAGNOSIS — Z4889 Encounter for other specified surgical aftercare: Secondary | ICD-10-CM

## 2013-06-04 NOTE — Progress Notes (Signed)
Subjective:     Patient ID: Deborah [name], female   DOB: [birth year], 57 y.o.   MRN: [id number]  HPI  Ms. Deiss follows up 2 months status post open repair of ventral hernia. She's doing very well from her procedure. She is mostly off of her oxygen. She has returned to her normal activities and the hardness of her incision has improved. She says she still has some occasional discomfort with ab exercises.  She otherwise has no complaints. The sharp pain that she was having has resolved. Review of Systems     Objective:   Physical Exam Her abdomen is soft and nontender on exam there is no evidence of recurrent bulge or hernia. The hardness under her incision has improved there is no evidence of any residual seroma.  She has good cosmesis.    Assessment:     Status post open repair of ventral hernia-doing well She is doing well from her procedure without any evidence of postoperative complications. There's no evidence recurrent hernias. She has no complaints. I really have no other recommendations for her and I think that she will continue to improve with time. She can gradually increase activity as tolerated. She says that she continues to be smoke free.     Plan:     Gradually increase activity as tolerated and followup with me when necessary

## 2013-06-05 ENCOUNTER — Encounter: Payer: Self-pay | Admitting: Internal Medicine

## 2013-06-05 ENCOUNTER — Ambulatory Visit (INDEPENDENT_AMBULATORY_CARE_PROVIDER_SITE_OTHER): Payer: BC Managed Care – PPO | Admitting: Internal Medicine

## 2013-06-05 VITALS — BP 128/74 | HR 102 | Temp 98.1°F | Ht 63.0 in | Wt 250.0 lb

## 2013-06-05 DIAGNOSIS — R0609 Other forms of dyspnea: Secondary | ICD-10-CM

## 2013-06-05 DIAGNOSIS — J449 Chronic obstructive pulmonary disease, unspecified: Secondary | ICD-10-CM

## 2013-06-05 DIAGNOSIS — R0600 Dyspnea, unspecified: Secondary | ICD-10-CM

## 2013-06-05 DIAGNOSIS — J9610 Chronic respiratory failure, unspecified whether with hypoxia or hypercapnia: Secondary | ICD-10-CM

## 2013-06-05 NOTE — Progress Notes (Signed)
PFT done. Jennifer Castillo, CMA  

## 2013-06-05 NOTE — Progress Notes (Signed)
Subjective:    Patient ID: Deborah [name], female    DOB: [birth year]   MRN: [id number]    Brief patient profile:  56 yowf quit smoking 12/2012 with onset of doe x 2008  assoc with pattern of recurrent "bronchitis" up to 4 x per year much better since quit smoking but breathing no better and newly dx'd with desat post op from umbilical hernia surgery placed on 02 at 2lpm and referred by Dr Dayton Martes 04/16/13 to pulmonary clinic.   History of Present Illness  04/17/2013 1st [name]/ Deborah [name]  Chief Complaint  Patient presents with  . Pulmonary Consult    Referred per Dr. Ruthe Mannan.  Pt c/o SOB x 6 yrs. She gets SOB with minimal exertion such as walking from room to room at home. She had recent hernia repair surgery 03/19/13 and was found to have o2 sats in the 80's.    cc progressive doe x  Years indolent onset much worse during exac with cough/" colds" rx with advair some better x one week prior to OV   but still needing albuterol sev times a day for relief of sob - cough is presently dry but with exac gets thicker and darker Very inactive since surgery  No need for 02 sitting still Use 02 2lpm at bedtime and as needed during the day with exertion more than just room to room Try off advair and take symbicort 160  Take 2 puffs first thing in am and then another 2 puffs about 12 hours later  06/05/2013 f/u ov/Deborah [name] re:  No better on symbicort  Chief Complaint  Patient presents with  . Follow-up    PFT done today.  Still having alot of SOB w/ exertion  wt 2007 190, much better activity tol when wears 02   No obvious day to day or daytime variabilty or assoc chronic cough or cp or chest tightness, subjective wheeze overt sinus or hb symptoms. No unusual exp hx or h/o childhood pna/ asthma or knowledge of premature birth.  Sleeping ok without nocturnal  or early am exacerbation  of respiratory  c/o's or need for noct saba. Also denies any obvious fluctuation of symptoms with  weather or environmental changes or other aggravating or alleviating factors except as outlined above   Current Medications, Allergies, Complete Past Medical History, Past Surgical History, Family History, and Social History were reviewed in [employer] record.  ROS  The following are not active complaints unless bolded sore throat, dysphagia, dental problems, itching, sneezing,  nasal congestion or excess/ purulent secretions, ear ache,   fever, chills, sweats, unintended wt loss, pleuritic or exertional cp, hemoptysis,  orthopnea pnd or leg swelling, presyncope, palpitations, heartburn, abdominal pain, anorexia, nausea, vomiting, diarrhea  or change in bowel or urinary habits, change in stools or urine, dysuria,hematuria,  rash, arthralgias, visual complaints, headache, numbness weakness or ataxia or problems with walking or coordination,  change in mood/affect or memory.                         Objective:   Physical Exam  amb obese wf nad    06/05/2013     250  Wt Readings from Last 3 Encounters:  04/17/13 244 lb (110.678 kg)  04/09/13 246 lb 8 oz (111.812 kg)  04/04/13 244 lb 6.4 oz (110.859 kg)     HEENT mild turbinate edema.  Oropharynx no thrush or excess pnd or cobblestoning.  No  JVD or cervical adenopathy. Mild accessory muscle hypertrophy. Trachea midline, nl thryroid. Chest was hyperinflated by percussion with diminished breath sounds and moderate increased exp time without wheeze. Hoover sign positive at mid inspiration. Regular rate and rhythm without murmur gallop or rub or increase P2 or edema.  Abd: no hsm, nl excursion. Ext warm without cyanosis or clubbing.    cxr 03/21/13  Borderline cardiac enlargement.  No acute pulmonary findings.       Assessment & Plan:

## 2013-06-05 NOTE — Patient Instructions (Signed)
Weight control is simply a matter of calorie balance which needs to be tilted in your favor by eating less and exercising more.  To get the most out of exercise, you need to be continuously aware that you are short of breath, but never out of breath, for 30 minutes daily. As you improve, it will actually be easier for you to do the same amount of exercise  in  30 minutes so always push to the level where you are short of breath on 02 .  If this does not result in gradual weight reduction then I strongly recommend you see a nutritionist with a food diary x 2 weeks so that we can work out a negative calorie balance which is universally effective in steady weight loss programs.  Think of your calorie balance like you do your bank account where in this case you want the balance to go down so you must take in less calories than you burn up.  It's just that simple:  Hard to do, but easy to understand.  Good luck!     If you are satisfied with your treatment plan let your doctor know and he/she can either refill your medications or you can return here when your prescription runs out.     If in any way you are not 100% satisfied,  please tell us.  If 100% better, tell your friends!

## 2013-06-08 NOTE — Assessment & Plan Note (Signed)
Cal balance issues reviewed

## 2013-06-08 NOTE — Assessment & Plan Note (Signed)
-   04/17/2013  Walked RA x 1 laps @ 185 ft each stopped due to  desat 86% rapid pace  From resting sat 94%  Ok at rest but always needs 02 at hs and with exertion until gets off at least 25 lbs then regroup

## 2013-06-08 NOTE — Assessment & Plan Note (Addendum)
-   hfa 75% p coaching 04/17/2013  - PFT's 06/05/2013 no airflow obst, dlco 60 corrects to 96%  Based on the chronicity and proportionality to wt gain I don't believe anything is needed other than wt loss (though she is at risk of pe, her hx doesn't fit - for any sudden change though in doe would get d dimer/ CTa if elevated)  See instructions for specific recommendations which were reviewed directly with the patient who was given a copy with highlighter outlining the key components.

## 2013-06-20 ENCOUNTER — Encounter: Payer: Self-pay | Admitting: Internal Medicine

## 2013-07-22 ENCOUNTER — Other Ambulatory Visit: Payer: Self-pay

## 2013-07-22 MED ORDER — VARENICLINE TARTRATE 1 MG PO TABS: 1.0000 mg | ORAL_TABLET | Freq: Two times a day (BID) | ORAL | Status: DC

## 2013-07-22 NOTE — Telephone Encounter (Signed)
Pt insurance has changed and request refill chantix to [facility].Please advise.

## 2013-07-23 ENCOUNTER — Telehealth: Payer: Self-pay | Admitting: *Deleted

## 2013-07-23 NOTE — Telephone Encounter (Signed)
Received prior auth request for Chantix. Auth paperwork obtained and placed in your inbox.

## 2013-07-24 NOTE — Telephone Encounter (Signed)
Signed and in my box. 

## 2013-07-24 NOTE — Telephone Encounter (Signed)
Pt left v/m requesting cb about Chantix prior auth. Pt is out of Chantix.

## 2013-07-25 NOTE — Telephone Encounter (Signed)
Prior auth forms faxed to ins company, pending response.

## 2013-07-30 NOTE — Telephone Encounter (Signed)
Prior auth denied by [employer]. Pharmacy notified via fax, and denial paperwork placed in inbox to be signed then scanned into pt's medical record.

## 2013-08-01 NOTE — Telephone Encounter (Signed)
I apologize that is what pt told me and then pt called back see this note at 3:01 pm that pt had not tried wellbutrin before. Pt has made 3 separate calls to our office today. [facility] is faxing a prior auth form for Zyban if you think appropriate for pt.

## 2013-08-01 NOTE — Telephone Encounter (Signed)
Pt left v/m that she looked up wellbutrin and pt has not tried wellbutrin before and is willing to try wellbutrin to help pt stop smoking if Dr [name] thinks appropriate.pt request cb.

## 2013-08-01 NOTE — Telephone Encounter (Addendum)
Pt called back and spoke with ins co and Zyban 150 mg is on preferred list; but still needs prior approval. [address] I am to call [phone];[facility] prior auth line; spoke with [employer] and she will fax prior auth form to [phone].

## 2013-08-01 NOTE — Telephone Encounter (Signed)
Yes appropriate if she can tolerate wellbutrin.

## 2013-08-01 NOTE — Telephone Encounter (Signed)
Noted.  Please let us know once she has talked to her insurance company as I am unaware of any substitutes for chantix.

## 2013-08-01 NOTE — Telephone Encounter (Addendum)
Pt left v/m about status of chantix; spoke with pt and advised prior auth was denied by insurance co. Pt has tried patches in the past to stop smoking and was not effective. Pt had tried wellbutrin years ago for depression and pt said she could not remember symptoms but said she cannot take Wellbutrin. Pt wants to know if another substitute or what Dr [name] suggest. Pt will contact ins. Co. To see if has preferred med to substitute for Chantix that pt can take.

## 2013-08-01 NOTE — Telephone Encounter (Signed)
Zyban is the same medication as Wellbutrin and earlier in your note, you stated that patient "cannot take" Wellbutrin.

## 2013-08-05 NOTE — Telephone Encounter (Signed)
Prior auth paperwork recieved and placed in your inbox for completion.

## 2013-08-05 NOTE — Telephone Encounter (Signed)
Spoke to [place], [place] who states that prior authorization has not been received as of yet. She states that forms will be completed upon arrival and pt will be contacted if approved or denied by insurance company.

## 2013-08-06 NOTE — Telephone Encounter (Signed)
Please call pt to find out what she has tried in past and complete what you can.  Thanks.

## 2013-08-06 NOTE — Telephone Encounter (Signed)
I spoke with patient and she states that she tried the nicotine patch 4 years ago and was intolerant to it. I will place forms back in your inbox.

## 2013-08-11 LAB — PULMONARY FUNCTION TEST
DL/VA % pred: 96 %
DL/VA: 4.52 ml/min/mmHg/L
DLCO unc % pred: 60 %
DLCO unc: 13.94 ml/min/mmHg
FEF 25-75 Post: 1.97 L/sec
FEF 25-75 Pre: 1.49 L/sec
FEF2575-%Change-Post: 32 %
FEF2575-%Pred-Post: 78 %
FEF2575-%Pred-Pre: 59 %
FEV1-%Change-Post: 7 %
FEV1-%Pred-Post: 60 %
FEV1-%Pred-Pre: 56 %
FEV1-Post: 1.58 L
FEV1-Pre: 1.47 L
FEV1FVC-%Change-Post: -1 %
FEV1FVC-%Pred-Pre: 103 %
FEV6-%Change-Post: 9 %
FEV6-%Pred-Post: 61 %
FEV6-%Pred-Pre: 56 %
FEV6-Post: 1.96 L
FEV6-Pre: 1.8 L
FEV6FVC-%Pred-Post: 103 %
FEV6FVC-%Pred-Pre: 103 %
FVC-%Change-Post: 9 %
FVC-%Pred-Post: 59 %
FVC-%Pred-Pre: 54 %
FVC-Post: 1.96 L
Post FEV1/FVC ratio: 81 %
Post FEV6/FVC ratio: 100 %
Pre FEV1/FVC ratio: 82 %
Pre FEV6/FVC Ratio: 100 %
RV % pred: 111 %
RV: 2.06 L
TLC % pred: 84 %
TLC: 4.13 L

## 2013-08-12 NOTE — Telephone Encounter (Signed)
Prior Josem Kaufmann was denied by insurance. Denial paperwork placed in your inbox to be signed then sent to scan into pt's medical record.

## 2013-08-14 ENCOUNTER — Telehealth: Payer: Self-pay

## 2013-08-14 NOTE — Telephone Encounter (Signed)
Deborah [name], please see below.

## 2013-08-14 NOTE — Telephone Encounter (Signed)
Opened in error

## 2013-08-14 NOTE — Telephone Encounter (Signed)
Pt left v/m that pt was notified Zyban was denied; pt has been trying patches to stop smoking again and pt has severe h/a from patches; pt wants to know if an appeal can be done to ins co for chantix.Please advise.

## 2014-07-25 ENCOUNTER — Inpatient Hospital Stay (HOSPITAL_COMMUNITY)
Admission: EM | Admit: 2014-07-25 | Discharge: 2014-08-04 | DRG: 291 | Disposition: A | Discharge status: Home Health | Payer: 59 | Attending: Internal Medicine | Admitting: Internal Medicine

## 2014-07-25 ENCOUNTER — Encounter (HOSPITAL_COMMUNITY): Payer: Self-pay | Admitting: *Deleted

## 2014-07-25 ENCOUNTER — Emergency Department (HOSPITAL_COMMUNITY): Payer: 59

## 2014-07-25 DIAGNOSIS — F17210 Nicotine dependence, cigarettes, uncomplicated: Secondary | ICD-10-CM | POA: Diagnosis present

## 2014-07-25 DIAGNOSIS — E872 Acidosis: Secondary | ICD-10-CM | POA: Diagnosis not present

## 2014-07-25 DIAGNOSIS — J9601 Acute respiratory failure with hypoxia: Secondary | ICD-10-CM | POA: Diagnosis present

## 2014-07-25 DIAGNOSIS — F319 Bipolar disorder, unspecified: Secondary | ICD-10-CM | POA: Diagnosis present

## 2014-07-25 DIAGNOSIS — J9602 Acute respiratory failure with hypercapnia: Secondary | ICD-10-CM | POA: Diagnosis present

## 2014-07-25 DIAGNOSIS — I10 Essential (primary) hypertension: Secondary | ICD-10-CM | POA: Diagnosis present

## 2014-07-25 DIAGNOSIS — I959 Hypotension, unspecified: Secondary | ICD-10-CM | POA: Diagnosis present

## 2014-07-25 DIAGNOSIS — I272 Other secondary pulmonary hypertension: Secondary | ICD-10-CM | POA: Diagnosis present

## 2014-07-25 DIAGNOSIS — I509 Heart failure, unspecified: Secondary | ICD-10-CM

## 2014-07-25 DIAGNOSIS — J984 Other disorders of lung: Secondary | ICD-10-CM | POA: Diagnosis present

## 2014-07-25 DIAGNOSIS — Z6841 Body Mass Index (BMI) 40.0 and over, adult: Secondary | ICD-10-CM | POA: Diagnosis not present

## 2014-07-25 DIAGNOSIS — E875 Hyperkalemia: Secondary | ICD-10-CM | POA: Diagnosis present

## 2014-07-25 DIAGNOSIS — E785 Hyperlipidemia, unspecified: Secondary | ICD-10-CM | POA: Diagnosis present

## 2014-07-25 DIAGNOSIS — R0902 Hypoxemia: Secondary | ICD-10-CM

## 2014-07-25 DIAGNOSIS — Z23 Encounter for immunization: Secondary | ICD-10-CM

## 2014-07-25 DIAGNOSIS — H109 Unspecified conjunctivitis: Secondary | ICD-10-CM | POA: Diagnosis not present

## 2014-07-25 DIAGNOSIS — R609 Edema, unspecified: Secondary | ICD-10-CM

## 2014-07-25 DIAGNOSIS — G4733 Obstructive sleep apnea (adult) (pediatric): Secondary | ICD-10-CM | POA: Diagnosis present

## 2014-07-25 DIAGNOSIS — J9600 Acute respiratory failure, unspecified whether with hypoxia or hypercapnia: Secondary | ICD-10-CM

## 2014-07-25 DIAGNOSIS — R0602 Shortness of breath: Secondary | ICD-10-CM

## 2014-07-25 DIAGNOSIS — J441 Chronic obstructive pulmonary disease with (acute) exacerbation: Secondary | ICD-10-CM | POA: Diagnosis present

## 2014-07-25 DIAGNOSIS — I5033 Acute on chronic diastolic (congestive) heart failure: Principal | ICD-10-CM | POA: Diagnosis present

## 2014-07-25 DIAGNOSIS — Z720 Tobacco use: Secondary | ICD-10-CM | POA: Diagnosis present

## 2014-07-25 DIAGNOSIS — I5031 Acute diastolic (congestive) heart failure: Secondary | ICD-10-CM | POA: Diagnosis present

## 2014-07-25 HISTORY — DX: Obesity, unspecified: E66.9

## 2014-07-25 HISTORY — DX: Heart failure, unspecified: I50.9

## 2014-07-25 HISTORY — DX: Tobacco use: Z72.0

## 2014-07-25 HISTORY — DX: Chronic obstructive pulmonary disease, unspecified: J44.9

## 2014-07-25 HISTORY — DX: Hyperkalemia: E87.5

## 2014-07-25 HISTORY — DX: Other disorders of lung: J98.4

## 2014-07-25 LAB — COMPREHENSIVE METABOLIC PANEL
ALT: 30 U/L (ref 0–35)
ALT: 31 U/L (ref 0–35)
AST: 36 U/L (ref 0–37)
AST: 33 U/L (ref 0–37)
Albumin: 3 g/dL — ABNORMAL LOW (ref 3.5–5.2)
Albumin: 2.9 g/dL — ABNORMAL LOW (ref 3.5–5.2)
Alkaline Phosphatase: 85 U/L (ref 39–117)
Alkaline Phosphatase: 80 U/L (ref 39–117)
Anion gap: 4 — ABNORMAL LOW (ref 5–15)
Anion gap: 10 (ref 5–15)
BUN: 16 mg/dL (ref 6–23)
BUN: 15 mg/dL (ref 6–23)
CO2: 34 mmol/L — ABNORMAL HIGH (ref 19–32)
CO2: 30 mmol/L (ref 19–32)
Calcium: 8.2 mg/dL — ABNORMAL LOW (ref 8.4–10.5)
Calcium: 8.2 mg/dL — ABNORMAL LOW (ref 8.4–10.5)
Chloride: 97 mEq/L (ref 96–112)
Chloride: 94 mEq/L — ABNORMAL LOW (ref 96–112)
Creatinine, Ser: 0.77 mg/dL (ref 0.50–1.10)
Creatinine, Ser: 0.74 mg/dL (ref 0.50–1.10)
GFR calc Af Amer: >90 mL/min (ref >90)
GFR calc Af Amer: >90 mL/min (ref >90)
GFR calc non Af Amer: >90 mL/min (ref >90)
GFR calc non Af Amer: >90 mL/min (ref >90)
Glucose, Bld: 84 mg/dL (ref 70–99)
Glucose, Bld: 100 mg/dL — ABNORMAL HIGH (ref 70–99)
Potassium: 5.3 mmol/L — ABNORMAL HIGH (ref 3.5–5.1)
Potassium: 4.7 mmol/L (ref 3.5–5.1)
Sodium: 135 mmol/L (ref 135–145)
Sodium: 134 mmol/L — ABNORMAL LOW (ref 135–145)
Total Bilirubin: 0.6 mg/dL (ref 0.3–1.2)
Total Bilirubin: 0.4 mg/dL (ref 0.3–1.2)
Total Protein: 7.1 g/dL (ref 6.0–8.3)
Total Protein: 6.9 g/dL (ref 6.0–8.3)

## 2014-07-25 LAB — TROPONIN I
Troponin I: <0.03 ng/mL (ref <0.031)
Troponin I: <0.03 ng/mL (ref <0.031)

## 2014-07-25 LAB — BLOOD GAS, ARTERIAL
Acid-Base Excess: 2.7 mmol/L — ABNORMAL HIGH (ref 0.0–2.0)
Acid-Base Excess: 3 mmol/L — ABNORMAL HIGH (ref 0.0–2.0)
Bicarbonate: 32 mEq/L — ABNORMAL HIGH (ref 20.0–24.0)
Bicarbonate: 32.9 mEq/L — ABNORMAL HIGH (ref 20.0–24.0)
Delivery systems: POSITIVE
Drawn by: 295031
Drawn by: 11249
FIO2: 0.5 %
O2 Content: 5 L/min
O2 Saturation: 85 %
O2 Saturation: 89.5 %
PEEP: 5 cmH2O
Patient temperature: 98.6
Patient temperature: 98.3
Pressure control: 5 cmH2O
RATE: 12 resp/min
TCO2: 28.7 mmol/L (ref 0–100)
TCO2: 29.5 mmol/L (ref 0–100)
pCO2 arterial: 72.4 mmHg (ref 35.0–45.0)
pCO2 arterial: 75.9 mmHg (ref 35.0–45.0)
pH, Arterial: 7.268 — ABNORMAL LOW (ref 7.350–7.450)
pH, Arterial: 7.258 — ABNORMAL LOW (ref 7.350–7.450)
pO2, Arterial: 55.4 mmHg — ABNORMAL LOW (ref 80.0–100.0)
pO2, Arterial: 63.9 mmHg — ABNORMAL LOW (ref 80.0–100.0)

## 2014-07-25 LAB — CBC WITH DIFFERENTIAL/PLATELET
Basophils Absolute: 0 10*3/uL (ref 0.0–0.1)
Basophils Relative: 0 % (ref 0–1)
Eosinophils Absolute: 0.1 10*3/uL (ref 0.0–0.7)
Eosinophils Relative: 2 % (ref 0–5)
HCT: 52.6 % — ABNORMAL HIGH (ref 36.0–46.0)
Hemoglobin: 14.7 g/dL (ref 12.0–15.0)
Lymphocytes Relative: 19 % (ref 12–46)
Lymphs Abs: 1.4 10*3/uL (ref 0.7–4.0)
MCH: 24.6 pg — ABNORMAL LOW (ref 26.0–34.0)
MCHC: 27.9 g/dL — ABNORMAL LOW (ref 30.0–36.0)
MCV: 88 fL (ref 78.0–100.0)
Monocytes Absolute: 0.8 10*3/uL (ref 0.1–1.0)
Monocytes Relative: 10 % (ref 3–12)
Neutro Abs: 5.1 10*3/uL (ref 1.7–7.7)
Neutrophils Relative %: 69 % (ref 43–77)
Platelets: 208 10*3/uL (ref 150–400)
RBC: 5.98 MIL/uL — ABNORMAL HIGH (ref 3.87–5.11)
RDW: 18.8 % — ABNORMAL HIGH (ref 11.5–15.5)
WBC: 7.5 10*3/uL (ref 4.0–10.5)

## 2014-07-25 LAB — MRSA PCR SCREENING: MRSA by PCR: NEGATIVE

## 2014-07-25 LAB — LACTIC ACID, PLASMA: Lactic Acid, Venous: 1.4 mmol/L (ref 0.5–2.2)

## 2014-07-25 LAB — PROCALCITONIN: Procalcitonin: <0.1 ng/mL

## 2014-07-25 LAB — BRAIN NATRIURETIC PEPTIDE: B Natriuretic Peptide: 296.8 pg/mL — ABNORMAL HIGH (ref 0.0–100.0)

## 2014-07-25 MED ORDER — SODIUM CHLORIDE 0.9 % IJ SOLN
3.0000 mL | Freq: Two times a day (BID) | INTRAMUSCULAR | Status: DC
  Administered 2014-07-25 – 2014-08-04 (×19): 3 mL via INTRAVENOUS

## 2014-07-25 MED ORDER — MONTELUKAST SODIUM 10 MG PO TABS
10.0000 mg | ORAL_TABLET | Freq: Every day | ORAL | Status: DC
  Administered 2014-07-25 – 2014-08-03 (×10): 10 mg via ORAL
  Filled 2014-07-25 (×11): qty 1

## 2014-07-25 MED ORDER — DEXTROSE 5 % IV SOLN
1.0000 g | INTRAVENOUS | Status: DC
  Administered 2014-07-25: 1 g via INTRAVENOUS
  Filled 2014-07-25: qty 10

## 2014-07-25 MED ORDER — IPRATROPIUM-ALBUTEROL 0.5-2.5 (3) MG/3ML IN SOLN
3.0000 mL | RESPIRATORY_TRACT | Status: DC | PRN
  Filled 2014-07-25: qty 3

## 2014-07-25 MED ORDER — NICOTINE 14 MG/24HR TD PT24
14.0000 mg | MEDICATED_PATCH | Freq: Every day | TRANSDERMAL | Status: DC
  Administered 2014-07-26: 14 mg via TRANSDERMAL
  Filled 2014-07-25 (×10): qty 1

## 2014-07-25 MED ORDER — ALBUTEROL (5 MG/ML) CONTINUOUS INHALATION SOLN
10.0000 mg/h | INHALATION_SOLUTION | RESPIRATORY_TRACT | Status: DC
  Administered 2014-07-25: 10 mg/h via RESPIRATORY_TRACT
  Filled 2014-07-25: qty 20

## 2014-07-25 MED ORDER — ALBUTEROL SULFATE (2.5 MG/3ML) 0.083% IN NEBU
5.0000 mg | INHALATION_SOLUTION | RESPIRATORY_TRACT | Status: DC
  Filled 2014-07-25: qty 6

## 2014-07-25 MED ORDER — FUROSEMIDE 10 MG/ML IJ SOLN
40.0000 mg | Freq: Once | INTRAMUSCULAR | Status: AC
  Administered 2014-07-25: 40 mg via INTRAVENOUS
  Filled 2014-07-25: qty 4

## 2014-07-25 MED ORDER — ASPIRIN EC 325 MG PO TBEC
325.0000 mg | DELAYED_RELEASE_TABLET | Freq: Every day | ORAL | Status: DC
  Administered 2014-07-25 – 2014-07-27 (×3): 325 mg via ORAL
  Filled 2014-07-25 (×3): qty 1

## 2014-07-25 MED ORDER — VARENICLINE TARTRATE 1 MG PO TABS
1.0000 mg | ORAL_TABLET | Freq: Every day | ORAL | Status: DC
  Administered 2014-07-25 – 2014-08-04 (×10): 1 mg via ORAL
  Filled 2014-07-25 (×12): qty 1

## 2014-07-25 MED ORDER — DEXTROSE 5 % IV SOLN
1.0000 g | Freq: Once | INTRAVENOUS | Status: AC
  Administered 2014-07-25: 1 g via INTRAVENOUS
  Filled 2014-07-25: qty 10

## 2014-07-25 MED ORDER — IPRATROPIUM BROMIDE 0.02 % IN SOLN
0.5000 mg | Freq: Once | RESPIRATORY_TRACT | Status: AC
  Administered 2014-07-25: 0.5 mg via RESPIRATORY_TRACT
  Filled 2014-07-25: qty 2.5

## 2014-07-25 MED ORDER — DEXTROSE 5 % IV SOLN: 500.0000 mg | INTRAVENOUS | Status: DC

## 2014-07-25 MED ORDER — METHYLPREDNISOLONE SODIUM SUCC 125 MG IJ SOLR
80.0000 mg | Freq: Four times a day (QID) | INTRAMUSCULAR | Status: DC
  Filled 2014-07-25: qty 2

## 2014-07-25 MED ORDER — ALBUTEROL SULFATE (2.5 MG/3ML) 0.083% IN NEBU
5.0000 mg | INHALATION_SOLUTION | Freq: Four times a day (QID) | RESPIRATORY_TRACT | Status: DC
Start: 2014-07-26 — End: 2014-07-29
  Administered 2014-07-25 – 2014-07-29 (×15): 5 mg via RESPIRATORY_TRACT
  Filled 2014-07-25 (×16): qty 6

## 2014-07-25 MED ORDER — CHLORHEXIDINE GLUCONATE 0.12 % MT SOLN
15.0000 mL | Freq: Two times a day (BID) | OROMUCOSAL | Status: DC
  Administered 2014-07-26 – 2014-08-04 (×15): 15 mL via OROMUCOSAL
  Filled 2014-07-25 (×22): qty 15

## 2014-07-25 MED ORDER — CETYLPYRIDINIUM CHLORIDE 0.05 % MT LIQD
7.0000 mL | Freq: Two times a day (BID) | OROMUCOSAL | Status: DC
  Administered 2014-07-26 – 2014-08-04 (×12): 7 mL via OROMUCOSAL

## 2014-07-25 MED ORDER — METHYLPREDNISOLONE SODIUM SUCC 125 MG IJ SOLR
125.0000 mg | Freq: Once | INTRAMUSCULAR | Status: AC
  Administered 2014-07-25: 125 mg via INTRAVENOUS
  Filled 2014-07-25: qty 2

## 2014-07-25 MED ORDER — HEPARIN SODIUM (PORCINE) 5000 UNIT/ML IJ SOLN
5000.0000 [IU] | Freq: Three times a day (TID) | INTRAMUSCULAR | Status: DC
  Administered 2014-07-25 – 2014-08-04 (×30): 5000 [IU] via SUBCUTANEOUS
  Filled 2014-07-25 (×32): qty 1

## 2014-07-25 MED ORDER — TIOTROPIUM BROMIDE MONOHYDRATE 18 MCG IN CAPS
18.0000 ug | ORAL_CAPSULE | Freq: Every day | RESPIRATORY_TRACT | Status: DC
  Administered 2014-07-28 – 2014-07-29 (×2): 18 ug via RESPIRATORY_TRACT
  Filled 2014-07-25 (×2): qty 5

## 2014-07-25 MED ORDER — ALBUTEROL SULFATE (2.5 MG/3ML) 0.083% IN NEBU
5.0000 mg | INHALATION_SOLUTION | Freq: Once | RESPIRATORY_TRACT | Status: AC
  Administered 2014-07-25: 5 mg via RESPIRATORY_TRACT
  Filled 2014-07-25: qty 6

## 2014-07-25 MED ORDER — DOPAMINE-DEXTROSE 3.2-5 MG/ML-% IV SOLN: 5.0000 ug/kg/min | INTRAVENOUS | Status: DC

## 2014-07-25 MED ORDER — DEXTROSE 5 % IV SOLN
500.0000 mg | Freq: Once | INTRAVENOUS | Status: AC
  Administered 2014-07-25: 500 mg via INTRAVENOUS
  Filled 2014-07-25: qty 500

## 2014-07-25 MED ORDER — FUROSEMIDE 10 MG/ML IJ SOLN
40.0000 mg | Freq: Two times a day (BID) | INTRAMUSCULAR | Status: DC
  Filled 2014-07-25: qty 4

## 2014-07-25 NOTE — Consult Note (Signed)
PULMONARY  / Deborah [name]   Name: Deborah [name] MRN: [id number] DOB: 02/05/1956    ADMISSION DATE:  07/25/2014 CONSULTATION DATE: 07/25/14  REQUESTING CLINICIAN: Dr. Verlon Au PRIMARY SERVICE: TRH  CHIEF COMPLAINT:  SOB, edema  BRIEF PATIENT DESCRIPTION: 83yof with PMH of morbid obesity, mild COPD, OSA presents with hypercarbic, hypoxic resp failure likely due to volume overload from previously undx CHF.  SIGNIFICANT EVENTS / STUDIES:  07/25/14: Admitted, placed on Bipap, CXR with enlarged cardiac silhouette  and ? RLL infiltrate vs atelectasis  LINES / TUBES: Foley:  07/25/14-->  CULTURES: 07/25/14:  Blood cx x 2 pending  ANTIBIOTICS: Ceftriaxone x 1 dose: 07/25/14  Azithromycin: 07/25/14 -->  HISTORY OF PRESENT ILLNESS:  41yof with PMH of morbid obesity, mild COPD, OSA presents with hypercarbic, hypoxic resp failure likely due to volume overload from previously undx CHF.  Pt reports that over the past 7-8 months shes had progressively worsening DOE, orthopnea and edema.  Has gained 60-70lbs in same time frame.  Denies chest pain.  Has rare cough.  Now with occasional palpitations with walking as well.  No known heart issues.  Ultimately her SOB progressed to the point where she sought medical care.  She was admitted, given steroids and abx for ?? COPD exacerbation, lasix for CHF exacerbation and placed on Bipap.  Pt reports breathing is better on Bipap and sats have improved at time of my exam to 95% on 50% with settings of 12/5.    PAST MEDICAL HISTORY :  Past Medical History  Diagnosis Date  . Hypertension   . Restrictive lung disease   . COPD, mild   . Heart failure     New onset 07/25/14  . Obesity (BMI 30-39.9)   . Tobacco abuse   . Hyperkalemia    History reviewed. No pertinent past surgical history. Prior to Admission medications   Medication Sig Start Date End Date Taking? Authorizing Provider  Aspirin-Salicylamide-Caffeine (BC HEADACHE PO) Take 1  packet by mouth 2 (two) times daily as needed (backache).   Yes Historical Provider, MD   No Known Allergies  FAMILY HISTORY:  Family History  Problem Relation Age of Onset  . Emphysema      Mother  . Heart failure      Father  . Cancer      Unknown   SOCIAL HISTORY:  reports that she has been smoking.  She does not have any smokeless tobacco history on file. She reports that she drinks alcohol. Her drug history is not on file.  REVIEW OF SYSTEMS:  10pts reviewed and negative except HPI  SUBJECTIVE: Reports feeling better with bipap on  VITAL SIGNS: Temp:  [97.8 F (36.6 C)-98.3 F (36.8 C)] 98.3 F (36.8 C) (01/09 1843) Pulse Rate:  [102-115] 106 (01/09 2015) Resp:  [12-27] 20 (01/09 2015) BP: (89-133)/(61-88) 121/84 mmHg (01/09 2015) SpO2:  [70 %-99 %] 97 % (01/09 2015) FiO2 (%):  [50 %] 50 % (01/09 1855) Weight:  [263 lb (119.296 kg)-276 lb 14.4 oz (125.6 kg)] 276 lb 14.4 oz (125.6 kg) (01/09 1845) HEMODYNAMICS:   VENTILATOR SETTINGS: Vent Mode:  [-] BIPAP FiO2 (%):  [50 %] 50 % Set Rate:  [12 bmp] 12 bmp PEEP:  [5 cmH20] 5 cmH20 INTAKE / OUTPUT: Intake/Output      01/09 0701 - 01/10 0700   IV Piggyback 250   Total Intake(mL/kg) 250 (2)   Urine (mL/kg/hr) 875   Total Output 875   Net -625  PHYSICAL EXAMINATION: General:  Obese, on bipap, NAD Neuro:  Alert and O x3, nonfocal HEENT:  JVD to earlobes with pt sitting upright, PERRL Cardiovascular:  Tachy, did not appreciate gallops or murmurs but distant heart sounds Lungs:  Distant lung sounds, but with good air movement and scattered exp wheezes, no crackles appreciated Abdomen:  Protuberant, +BS Musculoskeletal:  4+ pitting edema to mid back   LABS:  CBC  Recent Labs Lab 07/25/14 1534  WBC 7.5  HGB 14.7  HCT 52.6*  PLT 208   Coag's No results for input(s): APTT, INR in the last 168 hours. BMET  Recent Labs Lab 07/25/14 1534 07/25/14 1801  NA 135 134*  K 5.3* 4.7  CL 97 94*   CO2 34* 30  BUN 16 15  CREATININE 0.77 0.74  GLUCOSE 84 100*   Electrolytes  Recent Labs Lab 07/25/14 1534 07/25/14 1801  CALCIUM 8.2* 8.2*   Sepsis Markers  Recent Labs Lab 07/25/14 1801  LATICACIDVEN 1.4  PROCALCITON <0.10   ABG  Recent Labs Lab 07/25/14 1800 07/25/14 1944  PHART 7.268* 7.258*  PCO2ART 72.4* 75.9*  PO2ART 55.4* 63.9*   Liver Enzymes  Recent Labs Lab 07/25/14 1534 07/25/14 1801  AST 36 33  ALT 30 31  ALKPHOS 85 80  BILITOT 0.6 0.4  ALBUMIN 3.0* 2.9*   Cardiac Enzymes  Recent Labs Lab 07/25/14 1534  TROPONINI <0.03   Glucose No results for input(s): GLUCAP in the last 168 hours.  Imaging Dg Chest 2 View  07/25/2014   CLINICAL DATA:  Shortness of breath, bilateral leg swelling  EXAM: CHEST  2 VIEW  COMPARISON:  04/08/2004  FINDINGS: Cardiomegaly.  No frank interstitial edema.  Mild patchy opacity overlying the right lower hemithorax, atelectasis versus pneumonia, although some of this likely reflects overlying soft tissues.  No pleural effusion or pneumothorax.  IMPRESSION: Cardiomegaly.  No frank interstitial edema.  Right lower lobe opacity, atelectasis versus pneumonia.   Electronically Signed   By: Julian Hy M.D.   On: 07/25/2014 16:39    EKG: Sinus tach with rate of approx 110, low voltage, no ST changes.  ASSESSMENT / PLAN:  PULMONARY A: Hypoxic, hypercarbic resp failure likely 2/2 CHF and volume overload ?? CAP COPD  P:   Will hold steroids as they will not help volume removal and suspect CHF as largest contributor Lasix 40 IV BID Strict I's and O's with foley in place Bipap prn.  Will leave on for rest of tonight. Albuterol q6 nebs and duonebs prn q4 Ceftriaxone for ?? CAP but low threshold to de-escalate Sputum cx   CARDIOVASCULAR A: Likely new dx CHF, unclear diastolic or systolic or both at this time P:   Agree with TTE Agree with cardiology c/s Aggressive diuresis as above with goal 1-2L neg/  day Monitor lytes  Tele  RENAL A: No acute issues P:   Monitor I's and O's  GASTROINTESTINAL A: No acute issues P:   Fluid restricted diet  HEMATOLOGIC A: No acute issues P:     INFECTIOUS A: ?? CAP P:   Abx as above  ENDOCRINE A: No acute issues P:     NEUROLOGIC A: No acute issues P:     TODAY'S SUMMARY: Admitted with hypoxic, hypercarbic resp failure, suspect 2/2 CHF.  Getting Bipap, diuresis, TTE, cards c/s.    I have personally obtained a history, examined the patient, evaluated laboratory and imaging results, formulated the assessment and plan and placed orders.  CRITICAL CARE:  The patient is critically ill with multiple organ systems failure and requires high complexity decision making for assessment and support, frequent evaluation and titration of therapies, application of advanced monitoring technologies and extensive interpretation of multiple databases. Critical Care Time devoted to patient care services described in this note is 45 minutes.   Lucrezia Starch, MD Pulmonary and [place] Pager: [phone]   07/25/2014, 8:32 PM

## 2014-07-25 NOTE — Consult Note (Signed)
Referring Physician: Dr. Verlon Au Reason for Consultation: anasarca, newly diagnosed heart failure   HPI: [name] is a 89F with morbid obesity, mild COPD, and OSA who presented to the [facility] ED with hypercarbic/hypoxic respiratory failure and anasarca.  Six months ago she started to develop SOB, LE edema and abdominal distension.  Her symptoms have been progressively worse to the point where she can barely ambulate around her home.  She is dyspneic at rest and endorses PND.  She continues to sleep on one pillow and does not elevate the head of her bed.  She has gained 60lb in the last 6 months.  Her PO intake has diminished 2/2 early satiety and abdominal fullness when eating or drinking.  She denies HA, vision changes, fever, chills or chest pain.  She does not recall any viral or respiratory illnesses that preceded her symptoms.  [name] does report occasional fleeting palpitations that are not associated with increased SOB, CP or presyncope.  She endorses a non-productive cough.  [name] stopped smoking 12/2012 after a 115 pack year smoking history.  She drinks alcohol very infrequently.  Upon arrival in the ED [name] was found to be hypoxic to 70% on room air, BP 116/68, HR 115.  Her ABG was 7.27/72.4/55.4, bicarb 32.  CXR showed cardiomegaly and R lower lobe atelectasis vs pneumonia.  BNP was 299.  She was started on bipap and admitted to the ICU.   Review of Systems: As per HPI  Past Medical History  Diagnosis Date  . Hypertension   . Restrictive lung disease   . COPD, mild   . Heart failure     New onset 07/25/14  . Obesity (BMI 30-39.9)   . Tobacco abuse   . Hyperkalemia     Medications Prior to Admission  Medication Sig Dispense Refill  . Aspirin-Salicylamide-Caffeine (BC HEADACHE PO) Take 1 packet by mouth 2 (two) times daily as needed (backache).       Derrill Memo ON 07/26/2014] albuterol  5 mg Nebulization Q6H  . [START ON 07/26/2014] antiseptic oral rinse   7 mL Mouth Rinse q12n4p  . aspirin EC  325 mg Oral Daily  . cefTRIAXone (ROCEPHIN)  IV  1 g Intravenous Q24H  . chlorhexidine  15 mL Mouth Rinse BID  . [START ON 07/26/2014] furosemide  40 mg Intravenous BID  . heparin  5,000 Units Subcutaneous 3 times per day  . montelukast  10 mg Oral QHS  . nicotine  14 mg Transdermal Daily  . sodium chloride  3 mL Intravenous Q12H  . tiotropium  18 mcg Inhalation Daily  . varenicline  1 mg Oral Daily    Infusions:    No Known Allergies  History   Social History  . Marital Status: Married    Spouse Name: N/A    Number of Children: N/A  . Years of Education: N/A   Occupational History  . Not on file.   Social History Main Topics  . Smoking status: Current Every Day Smoker  . Smokeless tobacco: Not on file  . Alcohol Use: Yes  . Drug Use: Not on file  . Sexual Activity: Not on file   Other Topics Concern  . Not on file   Social History Narrative    Family History  Problem Relation Age of Onset  . Emphysema      Mother  . Heart failure      Father  . Cancer  Unknown    PHYSICAL EXAM: Filed Vitals:   07/25/14 2300  BP: 105/60  Pulse: 100  Temp:   Resp: 16     Intake/Output Summary (Last 24 hours) at 07/25/14 2353 Last data filed at 07/25/14 2300  Gross per 24 hour  Intake    305 ml  Output   1775 ml  Net  -1470 ml    General:  Ill appearing.Moderate respiratory distress on bipap.  Morbidly obese. HEENT: normal Neck: supple. JVP difficult to assess 2/2 habitus and bipap mask.  Cor: Distant heart sounds.  Tachycardic.  Regular rhythm. No rubs, gallops or murmurs.  Gastric sounds appreciated in thoracic cavity. Lungs: Diffuse expiratory wheezes and rhonchi in bilateral lower lung fields L>R Abdomen: RUQ TTP.  Obese.  No hepatosplenomegaly. No bruits or masses. Good bowel sounds. Extremities: no cyanosis, clubbing, rash.  2+ pitting edema.  LEs are slightly cool Neuro: alert & oriented x 3, cranial nerves  grossly intact. moves all 4 extremities w/o difficulty. Affect pleasant.  ECG: Sinus tach at 109bpm.  R axis deviation.  Possible prior septal and lateral MI.  Low voltage in precordial leads.  Results for orders placed or performed during the hospital encounter of 07/25/14 (from the past 24 hour(s))  CBC with Differential     Status: Abnormal   Collection Time: 07/25/14  3:34 PM  Result Value Ref Range   WBC 7.5 4.0 - 10.5 K/uL   RBC 5.98 (H) 3.87 - 5.11 MIL/uL   Hemoglobin 14.7 12.0 - 15.0 g/dL   HCT 52.6 (H) 36.0 - 46.0 %   MCV 88.0 78.0 - 100.0 fL   MCH 24.6 (L) 26.0 - 34.0 pg   MCHC 27.9 (L) 30.0 - 36.0 g/dL   RDW 18.8 (H) 11.5 - 15.5 %   Platelets 208 150 - 400 K/uL   Neutrophils Relative % 69 43 - 77 %   Neutro Abs 5.1 1.7 - 7.7 K/uL   Lymphocytes Relative 19 12 - 46 %   Lymphs Abs 1.4 0.7 - 4.0 K/uL   Monocytes Relative 10 3 - 12 %   Monocytes Absolute 0.8 0.1 - 1.0 K/uL   Eosinophils Relative 2 0 - 5 %   Eosinophils Absolute 0.1 0.0 - 0.7 K/uL   Basophils Relative 0 0 - 1 %   Basophils Absolute 0.0 0.0 - 0.1 K/uL  Comprehensive metabolic panel     Status: Abnormal   Collection Time: 07/25/14  3:34 PM  Result Value Ref Range   Sodium 135 135 - 145 mmol/L   Potassium 5.3 (H) 3.5 - 5.1 mmol/L   Chloride 97 96 - 112 mEq/L   CO2 34 (H) 19 - 32 mmol/L   Glucose, Bld 84 70 - 99 mg/dL   BUN 16 6 - 23 mg/dL   Creatinine, Ser 0.77 0.50 - 1.10 mg/dL   Calcium 8.2 (L) 8.4 - 10.5 mg/dL   Total Protein 7.1 6.0 - 8.3 g/dL   Albumin 3.0 (L) 3.5 - 5.2 g/dL   AST 36 0 - 37 U/L   ALT 30 0 - 35 U/L   Alkaline Phosphatase 85 39 - 117 U/L   Total Bilirubin 0.6 0.3 - 1.2 mg/dL   GFR calc non Af Amer >90 >90 mL/min   GFR calc Af Amer >90 >90 mL/min   Anion gap 4 (L) 5 - 15  Troponin I     Status: None   Collection Time: 07/25/14  3:34 PM  Result Value Ref Range  Troponin I <0.03 <0.031 ng/mL  Brain natriuretic peptide     Status: Abnormal   Collection Time: 07/25/14  3:34 PM    Result Value Ref Range   B Natriuretic Peptide 296.8 (H) 0.0 - 100.0 pg/mL  Blood gas, arterial     Status: Abnormal   Collection Time: 07/25/14  6:00 PM  Result Value Ref Range   O2 Content 5.0 L/min   Delivery systems NASAL CANNULA    pH, Arterial 7.268 (L) 7.350 - 7.450   pCO2 arterial 72.4 (HH) 35.0 - 45.0 mmHg   pO2, Arterial 55.4 (L) 80.0 - 100.0 mmHg   Bicarbonate 32.0 (H) 20.0 - 24.0 mEq/L   TCO2 28.7 0 - 100 mmol/L   Acid-Base Excess 2.7 (H) 0.0 - 2.0 mmol/L   O2 Saturation 85.0 %   Patient temperature 98.6    Collection site RIGHT RADIAL    Drawn by [phone]    Sample type ARTERIAL DRAW    Allens test (pass/fail) PASS PASS  Procalcitonin - Baseline     Status: None   Collection Time: 07/25/14  6:01 PM  Result Value Ref Range   Procalcitonin <0.10 ng/mL  Lactic acid, plasma     Status: None   Collection Time: 07/25/14  6:01 PM  Result Value Ref Range   Lactic Acid, Venous 1.4 0.5 - 2.2 mmol/L  Comprehensive metabolic panel     Status: Abnormal   Collection Time: 07/25/14  6:01 PM  Result Value Ref Range   Sodium 134 (L) 135 - 145 mmol/L   Potassium 4.7 3.5 - 5.1 mmol/L   Chloride 94 (L) 96 - 112 mEq/L   CO2 30 19 - 32 mmol/L   Glucose, Bld 100 (H) 70 - 99 mg/dL   BUN 15 6 - 23 mg/dL   Creatinine, Ser 0.74 0.50 - 1.10 mg/dL   Calcium 8.2 (L) 8.4 - 10.5 mg/dL   Total Protein 6.9 6.0 - 8.3 g/dL   Albumin 2.9 (L) 3.5 - 5.2 g/dL   AST 33 0 - 37 U/L   ALT 31 0 - 35 U/L   Alkaline Phosphatase 80 39 - 117 U/L   Total Bilirubin 0.4 0.3 - 1.2 mg/dL   GFR calc non Af Amer >90 >90 mL/min   GFR calc Af Amer >90 >90 mL/min   Anion gap 10 5 - 15  MRSA PCR Screening     Status: None   Collection Time: 07/25/14  6:33 PM  Result Value Ref Range   MRSA by PCR NEGATIVE NEGATIVE  Blood gas, arterial     Status: Abnormal   Collection Time: 07/25/14  7:44 PM  Result Value Ref Range   FIO2 0.50 %   Delivery systems BILEVEL POSITIVE AIRWAY PRESSURE    Mode PRESSURE CONTROL     Rate 12 resp/min   Peep/cpap 5.0 cm H20   Pressure control 5 cm H20   pH, Arterial 7.258 (L) 7.350 - 7.450   pCO2 arterial 75.9 (HH) 35.0 - 45.0 mmHg   pO2, Arterial 63.9 (L) 80.0 - 100.0 mmHg   Bicarbonate 32.9 (H) 20.0 - 24.0 mEq/L   TCO2 29.5 0 - 100 mmol/L   Acid-Base Excess 3.0 (H) 0.0 - 2.0 mmol/L   O2 Saturation 89.5 %   Patient temperature 98.3    Collection site LEFT RADIAL    Drawn by [id number]    Sample type ARTERIAL DRAW    Allens test (pass/fail) PASS PASS  Troponin I     Status:  None   Collection Time: 07/25/14  9:30 PM  Result Value Ref Range   Troponin I <0.03 <0.031 ng/mL   Dg Chest 2 View  07/25/2014   CLINICAL DATA:  Shortness of breath, bilateral leg swelling  EXAM: CHEST  2 VIEW  COMPARISON:  04/08/2004  FINDINGS: Cardiomegaly.  No frank interstitial edema.  Mild patchy opacity overlying the right lower hemithorax, atelectasis versus pneumonia, although some of this likely reflects overlying soft tissues.  No pleural effusion or pneumothorax.  IMPRESSION: Cardiomegaly.  No frank interstitial edema.  Right lower lobe opacity, atelectasis versus pneumonia.   Electronically Signed   By: Julian Hy M.D.   On: 07/25/2014 16:39    ASSESSMENT: 83F with morbid obesity, mild COPD, and OSA who presented to the [facility] ED with hypercarbic/hypoxic respiratory failure and anasarca.  [name] certainly has many reasons to be SOB, including morbid obesity, COPD and deconditioning.  However, newly diagnosed heart failure is likely the main source of her SOB and anasarca at this time.  Her BNP is only mildly elevated, which is likely due to her morbid obesity.  She is slightly cool on exam and BP has been on the low side.  Thus far she is tolerating diuresis.  ECG shows signs of possible prior infarct.  If the echo tomorrow shows new systolic dysfunction, she will need LHC when stable to further evaluate.  CXR does not show frank volume overload, which also raises the possibility  of R heart failure ?pulmonary HTN.  Will know more after TTE tomorrow.   PLAN/DISCUSSION: - Agree with TTE in AM - Continue aggressive diuresis.  Goal net negative 1.5 to 2.5L daily - If BP drops with diuresis, consider adding low-dose dopamine  - Will start cardioprotective meds +/- inotropes  tomorrow as needed based on echo findings - check TSH, fT4 - will monitor telemetry for arrhythmia as a source of her cardiomyopathy, though this is unlikely - No need to cycle BNP

## 2014-07-25 NOTE — Progress Notes (Signed)
VASCULAR LAB PRELIMINARY  PRELIMINARY  PRELIMINARY  PRELIMINARY  Bilateral lower extremity venous Dopplers completed.    Preliminary report:  There is no DVT or SVT noted in the bilateral lower extremities.  There is significant interstitial fluid noted throughout and pulsatile waveforms suggestive of fluid overload.   Bryar Dahms, RVT 07/25/2014, 4:47 PM

## 2014-07-25 NOTE — Progress Notes (Signed)
CRITICAL VALUE ALERT  Critical value received:  PCO2 75.9  Date of notification:  07/25/2014  Time of notification:  1955  Critical value read back:Yes.    Nurse who received alert:  Reche Dixon  MD notified (1st page):  Dr Halford Chessman  Time of first page: 2000  MD notified (2nd page):  Time of second page:  Responding MD:  Dr Halford Chessman  Time MD responded:  2030

## 2014-07-25 NOTE — ED Notes (Signed)
Hospitalist MD at bedside. 

## 2014-07-25 NOTE — H&P (Signed)
[facility] History and Physical  NOTE-Patient has 2 charts  Deborah [name] TTS:[id number] DOB: Oct 10, 1955 DOA: 07/25/2014  Referring physician: ED PCP: No primary care provider on file.  Specialists: Pulmonary  Chief Complaint:  SOB LE swelling  HPI: 59 y/o ? former smoker, COPD fol Dr. Melvyn Novas PFT 05/2013 Mild COPD c Severe restrictive deficit, chronic deconditioning, Htn, Morbid obesity, Body mass index is 45.12 kg/(m^2)., bipolar came to the emergency room after feeling poorly for the past 6 months-her reason for not coming early as because she does not like to get hospital bills and cannot afford to pay for medical care She states she's been feeling poorly, short of breath, increasing dyspnea on exertion. In addition she's been having increasing lower extremity which has remained chronic over the past 4 months although previously it had been intermittent. She states her legs remain swollen and she finds difficulty putting on her regular clothes and difficulty wearing her regular shoes and gets increasingly dyspneic on exertion. She has never been diagnosed with sleep apnea nor had a sleep study On laying down she does complain of pathognomic platypnea and orthopnea However she is able to sleep on her side recumbent in bed flat.  She states that she had done a great job of quitting tobacco in 12/2012 however because insurance did not approve her Chantix, she started back and is on half per day She is a daily smoker since age of 59 She does not use illicit drugs She lives with her husband at home and has 2 children She used to work in [employer] fund[employer] until she was disabled secondary to the COPD/lung disease  Emergency room workup revealed she was significantly hypoxic to the 70s and was on a nonrebreather on evaluation initially was given 2 nebs including an hour-long nap. She desats to the 80s on 3-4 L and just by talking but is not having difficulty getting her words out Blood pressure  borderline low 100s over 90s, Office visit blood pressures 05/2013 128/70  Review of Systems: The patient denies  F/chil/n/v/cp/rash/melena, hematochezia, dysuria, vaginal discharge, unilateral weakness, strokelike symptoms, seizure,  History reviewed. No pertinent past medical history. History reviewed. No pertinent past surgical history. Social History:  History   Social History Narrative  . No narrative on file    No Known Allergies  Family History  Problem Relation Age of Onset  . Emphysema      Mother  . Heart failure      Father  . Cancer      Unknown    Prior to Admission medications   Medication Sig Start Date End Date Taking? Authorizing Provider  Aspirin-Salicylamide-Caffeine (BC HEADACHE PO) Take 1 packet by mouth 2 (two) times daily as needed (backache).   Yes Historical Provider, MD   Physical Exam: Filed Vitals:   07/25/14 1634 07/25/14 1636 07/25/14 1639 07/25/14 1746  BP: 89/62 94/63    Pulse: 102     Temp:      TempSrc:      Resp: 20     Height:    5\' 4"  (1.626 m)  Weight:    119.296 kg (263 lb)  SpO2: 94%  94%     Alert, disheveled, Body mass index is 45.12 kg/(m^2). Mallampati 4 Wheezing throughout bilaterally no fremitus normal resonance S1-S2 displaced PMI, no JVD attributable and very difficult exam no bruit Abdomen distended and obese Grade 4 lower extremity edema with borderline anasarca Intact neurologically No rash  Labs on Admission:  Basic Metabolic  Panel:  Recent Labs Lab 07/25/14 1534  NA 135  K 5.3*  CL 97  CO2 34*  GLUCOSE 84  BUN 16  CREATININE 0.77  CALCIUM 8.2*   Liver Function Tests:  Recent Labs Lab 07/25/14 1534  AST 36  ALT 30  ALKPHOS 85  BILITOT 0.6  PROT 7.1  ALBUMIN 3.0*   No results for input(s): LIPASE, AMYLASE in the last 168 hours. No results for input(s): AMMONIA in the last 168 hours. CBC:  Recent Labs Lab 07/25/14 1534  WBC 7.5  NEUTROABS 5.1  HGB 14.7  HCT 52.6*  MCV 88.0   PLT 208   Cardiac Enzymes:  Recent Labs Lab 07/25/14 1534  TROPONINI <0.03    BNP (last 3 results) No results for input(s): PROBNP in the last 8760 hours. CBG: No results for input(s): GLUCAP in the last 168 hours.  Radiological Exams on Admission: Dg Chest 2 View  07/25/2014   CLINICAL DATA:  Shortness of breath, bilateral leg swelling  EXAM: CHEST  2 VIEW  COMPARISON:  04/08/2004  FINDINGS: Cardiomegaly.  No frank interstitial edema.  Mild patchy opacity overlying the right lower hemithorax, atelectasis versus pneumonia, although some of this likely reflects overlying soft tissues.  No pleural effusion or pneumothorax.  IMPRESSION: Cardiomegaly.  No frank interstitial edema.  Right lower lobe opacity, atelectasis versus pneumonia.   Electronically Signed   By: Julian Hy M.D.   On: 07/25/2014 16:39    EKG: Independently reviewed. Sinus tachycardia PR interval 0.12, QRS axis 100, no acute overt ST-T wave changes.  Assessment/Plan  Acute multifactorial respiratory failure [restrictive component=obesity, mild COPD, Acute ?diastolic hf with hypoxia and hypercarbia -stat ABG shows Ph 7.2, Ph 70 Place on Bipap at least overnight Rpt ABg 1 hour Add Tiotropium, Montelukast and continue IV steroids    CHF, acute on chronic, unknown EF-given her obesity, unclear what cutoff is 4 BNP, however she has grade 4 anasarca and this is likely not just "tissue fluid" I will give her a stat dose of IV Lasix 40 mg but monitor her closely and carefully as she is somewhat hypotensive today. Because of the uncertainty of her chronicity I will cycle her cardiac enzymes and get an EKG in the morning and get an echocardiogram.  Control the fluid will be challenging given her hypotension and I will ask critical care for an opinion-I will also consult Cardiology for assistance-discussed with Dr. Oval Linsey Cardiology Starting Dopamine at 5/hr to keep MAP >65 dependant on pressures...     Hypertension-supopsoed to be on HCTZ 12.5 daily however unclear if taking     Hyperkalemia-Rpt labs stat    CRLD (chronic restrictive lung disease) secondary to super morbid obesity with mild COPD component-Transition to Solu-medrol 80 iv q6    Tobacco abuse disorder-give patch 14 mcg    Bipolar 1 disorder-monitor   Full Code   Time spent: 95 min  CRITICAL CARE Performed by: Nita Sells   Total critical care time: 95  Critical care time was exclusive of separately billable procedures and treating other patients.  Critical care was necessary to treat or prevent imminent or life-threatening deterioration.  Critical care was time spent personally by me on the following activities: development of treatment plan with patient and/or surrogate as well as nursing, discussions with consultants, evaluation of patient's response to treatment, examination of patient, obtaining history from patient or surrogate, ordering and performing treatments and interventions, ordering and review of laboratory studies, ordering and review of radiographic  studies, pulse oximetry and re-evaluation of patient's condition.   Nita Sells [facility] Pager [phone]  If 7PM-7AM, please contact night-coverage [email or web address] Password [email or web address] 07/25/2014, 6:09 PM

## 2014-07-25 NOTE — ED Provider Notes (Signed)
CSN: [id number]     Arrival date & time 07/25/14  1441 History   First MD Initiated Contact with Patient 07/25/14 1502     Chief Complaint  Patient presents with  . Shortness of Breath  . Weakness   (Consider location/radiation/quality/duration/timing/severity/associated sxs/prior Treatment) HPI  Deborah [name] is a 59 yo female presenting with report of shortness of breath for over a year, progressively worse in the last 6 weeks.  Pt states she became more short of breath today when she was lying in the bed and finally decided to have her husband bring her in for eval.  She also reports  her left leg has been swelling in the last 6 weeks.  Her right leg has just recently begun swelling but the left is significantly more swollen. She endorses a slight non-productive cough. She states she has no medical diagnoses and was told by her doctor in March 2015 she had no diseases of her heart or lungs.  She also denies any diagnoses of diabetes.  She continues to smoke 1/2 a pack of cigarettes a day and most recently smoked prior to arrival. Her most recent surgery was September of 5176 for umbilical hernia repair.  She denies fever, chills, vomiting, diarrhea, chest pain, or hemoptysis.  History reviewed. No pertinent past medical history. History reviewed. No pertinent past surgical history. No family history on file. History  Substance Use Topics  . Smoking status: Current Every Day Smoker  . Smokeless tobacco: Not on file  . Alcohol Use: Yes   OB History    No data available     Review of Systems  Constitutional: Negative for fever and chills.  HENT: Negative for sore throat.   Eyes: Negative for visual disturbance.  Respiratory: Positive for cough and shortness of breath.   Cardiovascular: Positive for leg swelling. Negative for chest pain.  Gastrointestinal: Negative for nausea, vomiting and diarrhea.  Genitourinary: Negative for dysuria.  Musculoskeletal: Negative for myalgias.   Skin: Negative for rash.  Neurological: Negative for weakness, numbness and headaches.      Allergies  Review of patient's allergies indicates not on file.  Home Medications   Prior to Admission medications   Not on File   BP 116/68 mmHg  Pulse 115  Temp(Src) 97.8 F (36.6 C) (Oral)  Resp 18  SpO2 70% Physical Exam  Constitutional: She is oriented to person, place, and time. She appears well-developed and well-nourished. No distress.  HENT:  Head: Normocephalic and atraumatic.  Mouth/Throat: Oropharynx is clear and moist. No oropharyngeal exudate.  Eyes: Conjunctivae are normal.  Neck: Neck supple. No thyromegaly present.  Cardiovascular: Regular rhythm and intact distal pulses.  Tachycardia present.   Pulses:      Radial pulses are 2+ on the right side, and 2+ on the left side.       Dorsalis pedis pulses are 2+ on the right side, and 2+ on the left side.  4+ pitting edema left leg, 3+ edema right leg  Pulmonary/Chest: Effort normal. No respiratory distress. She has no decreased breath sounds. She has wheezes in the right upper field, the right middle field, the right lower field, the left upper field, the left middle field and the left lower field. She has no rhonchi. She has rales in the right middle field, the right lower field, the left middle field and the left lower field. She exhibits no tenderness.  Abdominal: Soft. There is no tenderness.  Musculoskeletal: She exhibits no tenderness.  Lymphadenopathy:  She has no cervical adenopathy.  Neurological: She is alert and oriented to person, place, and time.  Skin: Skin is warm and dry. No rash noted. She is not diaphoretic.  Psychiatric: She has a normal mood and affect.  Nursing note and vitals reviewed.   ED Course  Procedures (including critical care time) CRITICAL CARE Performed by: Britt Bottom   Total critical care time: 30 min  Critical care time was exclusive of separately billable procedures  and treating other patients.  Critical care was necessary to treat or prevent imminent or life-threatening deterioration.  Critical care was time spent personally by me on the following activities: development of treatment plan with patient and/or surrogate as well as nursing, discussions with consultants, evaluation of patient's response to treatment, examination of patient, obtaining history from patient or surrogate, ordering and performing treatments and interventions, ordering and review of laboratory studies, ordering and review of radiographic studies, pulse oximetry and re-evaluation of patient's condition.   Labs Review Labs Reviewed  CBC WITH DIFFERENTIAL - Abnormal; Notable for the following:    RBC 5.98 (*)    HCT 52.6 (*)    MCH 24.6 (*)    MCHC 27.9 (*)    RDW 18.8 (*)    All other components within normal limits  COMPREHENSIVE METABOLIC PANEL - Abnormal; Notable for the following:    Potassium 5.3 (*)    CO2 34 (*)    Calcium 8.2 (*)    Albumin 3.0 (*)    Anion gap 4 (*)    All other components within normal limits  BRAIN NATRIURETIC PEPTIDE - Abnormal; Notable for the following:    B Natriuretic Peptide 296.8 (*)    All other components within normal limits  TROPONIN I    Imaging Review Dg Chest 2 View  07/25/2014   CLINICAL DATA:  Shortness of breath, bilateral leg swelling  EXAM: CHEST  2 VIEW  COMPARISON:  04/08/2004  FINDINGS: Cardiomegaly.  No frank interstitial edema.  Mild patchy opacity overlying the right lower hemithorax, atelectasis versus pneumonia, although some of this likely reflects overlying soft tissues.  No pleural effusion or pneumothorax.  IMPRESSION: Cardiomegaly.  No frank interstitial edema.  Right lower lobe opacity, atelectasis versus pneumonia.   Electronically Signed   By: Julian Hy M.D.   On: 07/25/2014 16:39     EKG Interpretation None      MDM   Final diagnoses:  None   59 yo female presenting with shortness of breath  and leg swelling.  While pt reports no medical diagnoses, she appears to be fluid overloaded with wheezes and rales bilat and bilat leg swelling, left greater than right.  Discussed case with Dr. Wilson Singer.  Her O2 sats increased after duoneb followed by Continuous neb and IV solu-medrol.  CBC, CMP, Troponin, BNP, EKG, CXR, BC x 2, Venous dopplers and re-eval. Consider Fluid overload, vs pneumonia, vs, COPD exacerbation vs DVT/PE.  Labs reviewed: Normal WBC, K: 5.3, BNP: 296.8 X-ray resulted: Shows pneumonia vs atelectasis [place] resulted: No DVT  With cxr showing possible pneumonia, will defer diuresis and start IV tx for CAP.  5:35 PM: Consulted with Dr. Verlon Au (hospitalist) regarding pt's status.  Will admit pt to stepdown for further evaluation of respiratory difficulty. The patient appears reasonably stabilized for admission considering the current resources, flow, and capabilities available in the ED at this time, and I doubt any further screening and/or treatment in the ED prior to admission.     Filed  Vitals:   07/25/14 1442  BP: 116/68  Pulse: 115  Temp: 97.8 F (36.6 C)  TempSrc: Oral  Resp: 18  SpO2: 70%   Meds given in ED:  Medications  albuterol (PROVENTIL) (2.5 MG/3ML) 0.083% nebulizer solution 5 mg (5 mg Nebulization Given 07/25/14 1511)  ipratropium (ATROVENT) nebulizer solution 0.5 mg (0.5 mg Nebulization Given 07/25/14 1511)  methylPREDNISolone sodium succinate (SOLU-MEDROL) 125 mg/2 mL injection 125 mg (125 mg Intravenous Given 07/25/14 1634)  azithromycin (ZITHROMAX) 500 mg in dextrose 5 % 250 mL IVPB (500 mg Intravenous New Bag/Given 07/25/14 1715)  cefTRIAXone (ROCEPHIN) 1 g in dextrose 5 % 50 mL IVPB (0 g Intravenous Stopped 07/25/14 1750)    New Prescriptions   No medications on file       Britt Bottom, NP 07/26/14 Violet  Virgel Manifold, MD 07/27/14 1127

## 2014-07-25 NOTE — ED Notes (Signed)
Pt states she has had shortness of breath, left leg edema and weakness for the past 6 weeks. Pt states she also has pain occasionally in her right and left flanks. Pt is not on O2 at home.

## 2014-07-25 NOTE — ED Notes (Signed)
US at bedside

## 2014-07-26 DIAGNOSIS — I509 Heart failure, unspecified: Secondary | ICD-10-CM

## 2014-07-26 DIAGNOSIS — J9601 Acute respiratory failure with hypoxia: Secondary | ICD-10-CM

## 2014-07-26 DIAGNOSIS — E872 Acidosis: Secondary | ICD-10-CM

## 2014-07-26 LAB — COMPREHENSIVE METABOLIC PANEL
ALT: 28 U/L (ref 0–35)
AST: 31 U/L (ref 0–37)
Albumin: 2.8 g/dL — ABNORMAL LOW (ref 3.5–5.2)
Alkaline Phosphatase: 72 U/L (ref 39–117)
Anion gap: 8 (ref 5–15)
BUN: 13 mg/dL (ref 6–23)
CO2: 35 mmol/L — ABNORMAL HIGH (ref 19–32)
Calcium: 8.1 mg/dL — ABNORMAL LOW (ref 8.4–10.5)
Chloride: 93 mEq/L — ABNORMAL LOW (ref 96–112)
Creatinine, Ser: 0.76 mg/dL (ref 0.50–1.10)
GFR calc Af Amer: >90 mL/min (ref >90)
GFR calc non Af Amer: >90 mL/min (ref >90)
Glucose, Bld: 120 mg/dL — ABNORMAL HIGH (ref 70–99)
Potassium: 4.8 mmol/L (ref 3.5–5.1)
Sodium: 136 mmol/L (ref 135–145)
Total Bilirubin: 0.4 mg/dL (ref 0.3–1.2)
Total Protein: 6.6 g/dL (ref 6.0–8.3)

## 2014-07-26 LAB — CBC
HCT: 52.6 % — ABNORMAL HIGH (ref 36.0–46.0)
Hemoglobin: 15.1 g/dL — ABNORMAL HIGH (ref 12.0–15.0)
MCH: 25.4 pg — ABNORMAL LOW (ref 26.0–34.0)
MCHC: 28.7 g/dL — ABNORMAL LOW (ref 30.0–36.0)
MCV: 88.6 fL (ref 78.0–100.0)
Platelets: 164 10*3/uL (ref 150–400)
RBC: 5.94 MIL/uL — ABNORMAL HIGH (ref 3.87–5.11)
RDW: 18.7 % — ABNORMAL HIGH (ref 11.5–15.5)
WBC: 6.9 10*3/uL (ref 4.0–10.5)

## 2014-07-26 LAB — BLOOD GAS, ARTERIAL
Acid-Base Excess: 7 mmol/L — ABNORMAL HIGH (ref 0.0–2.0)
Bicarbonate: 35.3 mEq/L — ABNORMAL HIGH (ref 20.0–24.0)
Expiratory PAP: 5
FIO2: 0.45 %
Inspiratory PAP: 15
Mode: POSITIVE
O2 Saturation: 90.7 %
Patient temperature: 98.6
TCO2: 31.4 mmol/L (ref 0–100)
pCO2 arterial: 68.8 mmHg (ref 35.0–45.0)
pH, Arterial: 7.33 — ABNORMAL LOW (ref 7.350–7.450)
pO2, Arterial: 62.8 mmHg — ABNORMAL LOW (ref 80.0–100.0)

## 2014-07-26 LAB — TSH: TSH: 2.289 u[IU]/mL (ref 0.350–4.500)

## 2014-07-26 LAB — TROPONIN I: Troponin I: <0.03 ng/mL (ref <0.031)

## 2014-07-26 LAB — BRAIN NATRIURETIC PEPTIDE: B Natriuretic Peptide: 213.3 pg/mL — ABNORMAL HIGH (ref 0.0–100.0)

## 2014-07-26 LAB — HEMOGLOBIN A1C
Hgb A1c MFr Bld: 6.2 % — ABNORMAL HIGH (ref <5.7)
Mean Plasma Glucose: 131 mg/dL — ABNORMAL HIGH (ref <117)

## 2014-07-26 MED ORDER — INFLUENZA VAC SPLIT QUAD 0.5 ML IM SUSY
0.5000 mL | PREFILLED_SYRINGE | INTRAMUSCULAR | Status: AC
  Administered 2014-07-27: 0.5 mL via INTRAMUSCULAR
  Filled 2014-07-26 (×2): qty 0.5

## 2014-07-26 MED ORDER — DEXTROSE 5 % IV SOLN
4.0000 mg/h | INTRAVENOUS | Status: DC
  Administered 2014-07-26 – 2014-07-29 (×2): 4 mg/h via INTRAVENOUS
  Filled 2014-07-26 (×2): qty 25

## 2014-07-26 MED ORDER — PNEUMOCOCCAL VAC POLYVALENT 25 MCG/0.5ML IJ INJ
0.5000 mL | INJECTION | INTRAMUSCULAR | Status: AC
Start: 2014-07-27 — End: 2014-07-27
  Administered 2014-07-27: 0.5 mL via INTRAMUSCULAR
  Filled 2014-07-26 (×2): qty 0.5

## 2014-07-26 MED ORDER — LISINOPRIL 5 MG PO TABS
5.0000 mg | ORAL_TABLET | Freq: Every day | ORAL | Status: DC
  Administered 2014-07-26 – 2014-07-27 (×2): 5 mg via ORAL
  Filled 2014-07-26: qty 1
  Filled 2014-07-26 (×2): qty 2

## 2014-07-26 NOTE — Consult Note (Addendum)
PULMONARY  / [facility]   Name: Deborah [name] MRN: [id number] DOB: 1955-09-25    ADMISSION DATE:  07/25/2014 CONSULTATION DATE: 07/25/14  REQUESTING CLINICIAN: Dr. Verlon Au PRIMARY SERVICE: TRH  CHIEF COMPLAINT:  SOB, edema  BRIEF PATIENT DESCRIPTION: 39yof with PMH of morbid obesity, mild COPD, OSA presents with hypercarbic, hypoxic resp failure likely due to volume overload from previously undx CHF.  SIGNIFICANT EVENTS / STUDIES:  07/25/14: Admitted, placed on Bipap, CXR with enlarged cardiac silhouette  and ? RLL infiltrate vs atelectasis  LINES / TUBES: Foley:  07/25/14-->  CULTURES: 07/25/14:  Blood cx x 2 pending  ANTIBIOTICS: Ceftriaxone x 1 dose: 07/25/14 Azithromycin: 07/25/14 -->  SUBJECTIVE:    VITAL SIGNS: Temp:  [97.3 F (36.3 C)-98.5 F (36.9 C)] 97.7 F (36.5 C) (01/10 0740) Pulse Rate:  [86-115] 86 (01/10 0800) Resp:  [12-27] 18 (01/10 0800) BP: (89-158)/(60-134) 147/123 mmHg (01/10 0850) SpO2:  [70 %-100 %] 94 % (01/10 0800) FiO2 (%):  [45 %-60 %] 45 % (01/10 0742) Weight:  [119.296 kg (263 lb)-125.6 kg (276 lb 14.4 oz)] 125.6 kg (276 lb 14.4 oz) (01/10 0400) HEMODYNAMICS:   VENTILATOR SETTINGS: Vent Mode:  [-] BIPAP FiO2 (%):  [45 %-60 %] 45 % Set Rate:  [12 bmp] 12 bmp PEEP:  [5 cmH20] 5 cmH20 INTAKE / OUTPUT: Intake/Output      01/09 0701 - 01/10 0700 01/10 0701 - 01/11 0700   P.O.  120   Other 50 5   IV Piggyback 300    Total Intake(mL/kg) 350 (2.8) 125 (1)   Urine (mL/kg/hr) 2440 110 (0.3)   Total Output 2440 110   Net -2090 +15          PHYSICAL EXAMINATION: General:  Obese, on bipap, NAD Neuro:  Alert and O x3, nonfocal HEENT:  JVD to earlobes with pt sitting upright, PERRL Cardiovascular:  Tachy, did not appreciate gallops or murmurs but distant heart sounds Lungs:  Distant lung sounds, but with good air movement and scattered exp wheezes, no crackles appreciated Abdomen:  Protuberant, +BS Musculoskeletal:  4+  pitting edema to mid back   LABS:  CBC  Recent Labs Lab 07/25/14 1534 07/26/14 0425  WBC 7.5 6.9  HGB 14.7 15.1*  HCT 52.6* 52.6*  PLT 208 164   Coag's No results for input(s): APTT, INR in the last 168 hours. BMET  Recent Labs Lab 07/25/14 1534 07/25/14 1801 07/26/14 0425  NA 135 134* 136  K 5.3* 4.7 4.8  CL 97 94* 93*  CO2 34* 30 35*  BUN 16 15 13   CREATININE 0.77 0.74 0.76  GLUCOSE 84 100* 120*   Electrolytes  Recent Labs Lab 07/25/14 1534 07/25/14 1801 07/26/14 0425  CALCIUM 8.2* 8.2* 8.1*   Sepsis Markers  Recent Labs Lab 07/25/14 1801  LATICACIDVEN 1.4  PROCALCITON <0.10   ABG  Recent Labs Lab 07/25/14 1800 07/25/14 1944 07/26/14 0834  PHART 7.268* 7.258* 7.330*  PCO2ART 72.4* 75.9* 68.8*  PO2ART 55.4* 63.9* 62.8*   Liver Enzymes  Recent Labs Lab 07/25/14 1534 07/25/14 1801 07/26/14 0425  AST 36 33 31  ALT 30 31 28   ALKPHOS 85 80 72  BILITOT 0.6 0.4 0.4  ALBUMIN 3.0* 2.9* 2.8*   Cardiac Enzymes  Recent Labs Lab 07/25/14 1534 07/25/14 2130 07/26/14 0425  TROPONINI <0.03 <0.03 <0.03   Glucose No results for input(s): GLUCAP in the last 168 hours.  Imaging Dg Chest 2 View  07/25/2014   CLINICAL DATA:  Shortness of breath, bilateral leg swelling  EXAM: CHEST  2 VIEW  COMPARISON:  04/08/2004  FINDINGS: Cardiomegaly.  No frank interstitial edema.  Mild patchy opacity overlying the right lower hemithorax, atelectasis versus pneumonia, although some of this likely reflects overlying soft tissues.  No pleural effusion or pneumothorax.  IMPRESSION: Cardiomegaly.  No frank interstitial edema.  Right lower lobe opacity, atelectasis versus pneumonia.   Electronically Signed   By: Julian Hy M.D.   On: 07/25/2014 16:39   CXR 1/9 >> RML atx vs collapse vs infiltrate  EKG: Sinus tach with rate of approx 110, low voltage, no ST changes.  ASSESSMENT / PLAN:  PULMONARY A: Hypoxic, hypercarbic resp failure likely 2/2 CHF and  volume overload, suspect R heart failure and secondary PAH are largest issue here ?? RML CAP vs atx and collapse; clinical course inconsistent w infection Mild COPD - she tells me that she has had PFT and was told "her lungs were fine". I dont have access to PFT at this time but suspect that she has mixed disease.   P:   Will hold steroids as they will not help volume removal and suspect cor pulmonale as largest contributor Lasix 40 IV BID Strict I's and O's with foley in place Bipap prn and qhs  Will need a formal sleep study as an outpt and probably set up with outpt BiPAP Albuterol q6 nebs and duonebs prn q4 Sputum cx   CARDIOVASCULAR A: Probable PAH with cor pulmonale Possible new dx CHF, unclear diastolic or systolic or both at this time P:   Agree with TTE Agree with cardiology c/s Aggressive diuresis as above with goal 1-2L neg/ day Will need to manage PAH underlying issues, especially OHS Monitor lytes  Tele  RENAL A: No acute issues P:   Monitor I's and O's  GASTROINTESTINAL A: No acute issues P:   Fluid restricted diet  HEMATOLOGIC A: No acute issues P:    INFECTIOUS A: Possible RML CAP P:   Will d/c abx 1/10 and follow clinically. Restart if suspicion for PNA increases  ENDOCRINE A: No acute issues P:     NEUROLOGIC A: No acute issues P:     TODAY'S SUMMARY: Admitted with hypoxic, hypercarbic resp failure. Suspect decompensated (and undiagnosed) secondary PAH with R heart failure. May also have component of systolic/diastolic CHF as well. Getting Bipap, diuresis, TTE, cards c/s.    I have personally obtained a history, examined the patient, evaluated laboratory and imaging results, formulated the assessment and plan and placed orders.  CRITICAL CARE: The patient is critically ill with multiple organ systems failure and requires high complexity decision making for assessment and support, frequent evaluation and titration of therapies,  application of advanced monitoring technologies and extensive interpretation of multiple databases. Critical Care Time devoted to patient care services described in this note is 35 minutes.   Baltazar Apo, MD, PhD 07/26/2014, 10:05 AM [facility] [phone] or if no answer [phone]

## 2014-07-26 NOTE — Progress Notes (Signed)
Consulting cardiologist: Dr. Skeet Latch  Seen for followup: Shortness of breath and hypoxic/hypercarbic respiratory failure  Subjective:    Currently on BiPAP, appears comfortable. No chest pain.  Objective:   Temp:  [97.8 F (36.6 C)-98.5 F (36.9 C)] 98.5 F (36.9 C) (01/10 0000) Pulse Rate:  [86-115] 88 (01/10 0700) Resp:  [12-27] 19 (01/10 0700) BP: (89-147)/(60-124) 147/124 mmHg (01/10 0700) SpO2:  [70 %-100 %] 96 % (01/10 0700) FiO2 (%):  [50 %-60 %] 60 % (01/10 0314) Weight:  [263 lb (119.296 kg)-276 lb 14.4 oz (125.6 kg)] 276 lb 14.4 oz (125.6 kg) (01/09 1845) Last BM Date:  (pta)  Filed Weights   07/25/14 1746 07/25/14 1845  Weight: 263 lb (119.296 kg) 276 lb 14.4 oz (125.6 kg)    Intake/Output Summary (Last 24 hours) at 07/26/14 0724 Last data filed at 07/26/14 0700  Gross per 24 hour  Intake    350 ml  Output   2440 ml  Net  -2090 ml    Telemetry: Sinus rhythm, brief burst NSVT noted.  Exam:  General: Morbidly obese. No distress.  Lungs: Decreased breath sounds throughout.  Cardiac: Distant RRR, no obvious murmurs over ambient noise.  Abdomen: Obese, nontender.  Extremities: Leg edema, left worse than right.  Lab Results:  Basic Metabolic Panel:  Recent Labs Lab 07/25/14 1534 07/25/14 1801 07/26/14 0425  NA 135 134* 136  K 5.3* 4.7 4.8  CL 97 94* 93*  CO2 34* 30 35*  GLUCOSE 84 100* 120*  BUN 16 15 13   CREATININE 0.77 0.74 0.76  CALCIUM 8.2* 8.2* 8.1*    Liver Function Tests:  Recent Labs Lab 07/25/14 1534 07/25/14 1801 07/26/14 0425  AST 36 33 31  ALT 30 31 28   ALKPHOS 85 80 72  BILITOT 0.6 0.4 0.4  PROT 7.1 6.9 6.6  ALBUMIN 3.0* 2.9* 2.8*    CBC:  Recent Labs Lab 07/25/14 1534 07/26/14 0425  WBC 7.5 6.9  HGB 14.7 15.1*  HCT 52.6* 52.6*  MCV 88.0 88.6  PLT 208 164    Cardiac Enzymes:  Recent Labs Lab 07/25/14 1534 07/25/14 2130 07/26/14 0425  TROPONINI <0.03 <0.03 <0.03     BNP: 213  ECG: Sinus rhythm with low voltage and rightward axis, nonspecific ST changes.  Imaging: EXAM: CHEST 2 VIEW  COMPARISON: 04/08/2004  FINDINGS: Cardiomegaly. No frank interstitial edema.  Mild patchy opacity overlying the right lower hemithorax, atelectasis versus pneumonia, although some of this likely reflects overlying soft tissues.  No pleural effusion or pneumothorax.  IMPRESSION: Cardiomegaly. No frank interstitial edema.  Right lower lobe opacity, atelectasis versus pneumonia.   Medications:   Scheduled Medications: . albuterol  5 mg Nebulization Q6H  . antiseptic oral rinse  7 mL Mouth Rinse q12n4p  . aspirin EC  325 mg Oral Daily  . cefTRIAXone (ROCEPHIN)  IV  1 g Intravenous Q24H  . chlorhexidine  15 mL Mouth Rinse BID  . furosemide  40 mg Intravenous BID  . heparin  5,000 Units Subcutaneous 3 times per day  . montelukast  10 mg Oral QHS  . nicotine  14 mg Transdermal Daily  . sodium chloride  3 mL Intravenous Q12H  . tiotropium  18 mcg Inhalation Daily  . varenicline  1 mg Oral Daily      PRN Medications:  ipratropium-albuterol   Assessment:   1. Hypoxic/hypercarbic respiratory failure, currently tolerating BiPAP with input from CCM. She is diuresing on Lasix, still volume overloaded with reported  substantially gain over the last few months. Chest x-ray with right lower lobe atelectasis versus infiltrate, on antibiotics. Echocardiogram is pending for assessment of cardiac structure and function.  2. Medical history includes COPD and reported restrictive lung disease.  3. Essential hypertension.  Plan/Discussion:    Continue Lasix at current dose, also on aspirin empirically. Would follow up on echocardiogram to help guide further management. Cardiomyopathy suspected, although she may also have pulmonary hypertension with predominantly right heart failure.   Deborah [name], M.D., F.A.C.C.

## 2014-07-26 NOTE — Progress Notes (Signed)
  Echocardiogram 2D Echocardiogram has been performed.  Deborah [name] 07/26/2014, 12:51 PM

## 2014-07-26 NOTE — Progress Notes (Signed)
[facility] PROGRESS NOTE  Filed Weights   07/25/14 1746 07/25/14 1845 07/26/14 0400  Weight: 119.296 kg (263 lb) 125.6 kg (276 lb 14.4 oz) 125.6 kg (276 lb 14.4 oz)        Intake/Output Summary (Last 24 hours) at 07/26/14 9211 Last data filed at 07/26/14 0700  Gross per 24 hour  Intake    350 ml  Output   2440 ml  Net  -2090 ml     Assessment/Plan: Acute respiratory failure with hypoxia and hypercarbia due to CHF, acute on chronic, unknown EF: - Started on the BiPAP overnight and feels comfortable. - She does have crackles in her lungs, I cannot appreciate JVD but she has edema in her sacrum and lower extremities. - She's put out 2 L of urine with 80 of Lasix. - Her PCO2 was 75 and her bicarbonate on the basic metabolic panel is 32, she is a chronic retainer. - Continue IV Lasix will change it to an infusion at the same dose. - I appreciate cardiology's assistance, 2-D echo is pending. - We'll repeat ABG, however CO2 is improved we'll DC the BiPAP.  Essential  Hypertension - Her blood pressure has improved with diuresis. Now greater than 140/100, chloride and start her on lisinopril. - We'll go ahead and start her on beta blocker tomorrow morning depending on the 2-D echo results.  Hyperkalemia - Resolved.   CRLD (chronic restrictive lung disease) secondary to super morbid obesity with mild COPD component - Continue current home medications.  Tobacco abuse disorder: - Counseling.  Bipolar 1 disorder: - On no medications at home. - pharmacy to due med rec.   Code Status: full Family Communication: husband  Disposition Plan: inpatient   Consultants:  cardiology  Procedures: ECHO: pending  Antibiotics:  rocephin  HPI/Subjective: She relates her breathing is slightly better she does confirm that she smokes.  Objective: Filed Vitals:   07/26/14 0500 07/26/14 0600 07/26/14 0700 07/26/14 0740  BP: 94/78 100/73 147/124   Pulse: 87 88 88   Temp:       TempSrc:      Resp: 16 17 19    Height:      Weight:      SpO2: 96% 99% 96% 100%     Exam:  General: Alert, awake, oriented x3, in no acute distress.  HEENT: No bruits, no goiter.  Heart: Regular rate and rhythm distant heart sounds  Lungs: Good air movement, crackles on the right Abdomen: Soft, nontender, nondistended, positive bowel sounds. 3+ edema in her sacrum.     Data Reviewed: Basic Metabolic Panel:  Recent Labs Lab 07/25/14 1534 07/25/14 1801 07/26/14 0425  NA 135 134* 136  K 5.3* 4.7 4.8  CL 97 94* 93*  CO2 34* 30 35*  GLUCOSE 84 100* 120*  BUN 16 15 13   CREATININE 0.77 0.74 0.76  CALCIUM 8.2* 8.2* 8.1*   Liver Function Tests:  Recent Labs Lab 07/25/14 1534 07/25/14 1801 07/26/14 0425  AST 36 33 31  ALT 30 31 28   ALKPHOS 85 80 72  BILITOT 0.6 0.4 0.4  PROT 7.1 6.9 6.6  ALBUMIN 3.0* 2.9* 2.8*   No results for input(s): LIPASE, AMYLASE in the last 168 hours. No results for input(s): AMMONIA in the last 168 hours. CBC:  Recent Labs Lab 07/25/14 1534 07/26/14 0425  WBC 7.5 6.9  NEUTROABS 5.1  --   HGB 14.7 15.1*  HCT 52.6* 52.6*  MCV 88.0 88.6  PLT 208 164  Cardiac Enzymes:  Recent Labs Lab 07/25/14 1534 07/25/14 2130 07/26/14 0425  TROPONINI <0.03 <0.03 <0.03   BNP (last 3 results) No results for input(s): PROBNP in the last 8760 hours. CBG: No results for input(s): GLUCAP in the last 168 hours.  Recent Results (from the past 240 hour(s))  MRSA PCR Screening     Status: None   Collection Time: 07/25/14  6:33 PM  Result Value Ref Range Status   MRSA by PCR NEGATIVE NEGATIVE Final    Comment:        The GeneXpert MRSA Assay (FDA approved for NASAL specimens only), is one component of a comprehensive MRSA colonization surveillance program. It is not intended to diagnose MRSA infection nor to guide or monitor treatment for MRSA infections.      Studies: Dg Chest 2 View  07/25/2014   CLINICAL DATA:  Shortness of  breath, bilateral leg swelling  EXAM: CHEST  2 VIEW  COMPARISON:  04/08/2004  FINDINGS: Cardiomegaly.  No frank interstitial edema.  Mild patchy opacity overlying the right lower hemithorax, atelectasis versus pneumonia, although some of this likely reflects overlying soft tissues.  No pleural effusion or pneumothorax.  IMPRESSION: Cardiomegaly.  No frank interstitial edema.  Right lower lobe opacity, atelectasis versus pneumonia.   Electronically Signed   By: Julian Hy M.D.   On: 07/25/2014 16:39    Scheduled Meds: . albuterol  5 mg Nebulization Q6H  . antiseptic oral rinse  7 mL Mouth Rinse q12n4p  . aspirin EC  325 mg Oral Daily  . cefTRIAXone (ROCEPHIN)  IV  1 g Intravenous Q24H  . chlorhexidine  15 mL Mouth Rinse BID  . furosemide  40 mg Intravenous BID  . heparin  5,000 Units Subcutaneous 3 times per day  . montelukast  10 mg Oral QHS  . nicotine  14 mg Transdermal Daily  . sodium chloride  3 mL Intravenous Q12H  . tiotropium  18 mcg Inhalation Daily  . varenicline  1 mg Oral Daily   Continuous Infusions:    Charlynne Cousins  [facility] Pager [phone] If 8PM-8AM, please contact night-coverage at [email or web address], password [facility] 07/26/2014, 8:21 AM  LOS: 1 day

## 2014-07-27 ENCOUNTER — Inpatient Hospital Stay (HOSPITAL_COMMUNITY): Payer: 59

## 2014-07-27 DIAGNOSIS — I2609 Other pulmonary embolism with acute cor pulmonale: Secondary | ICD-10-CM

## 2014-07-27 DIAGNOSIS — I5043 Acute on chronic combined systolic (congestive) and diastolic (congestive) heart failure: Secondary | ICD-10-CM

## 2014-07-27 DIAGNOSIS — I5033 Acute on chronic diastolic (congestive) heart failure: Principal | ICD-10-CM

## 2014-07-27 DIAGNOSIS — R0602 Shortness of breath: Secondary | ICD-10-CM

## 2014-07-27 DIAGNOSIS — I270 Primary pulmonary hypertension: Secondary | ICD-10-CM

## 2014-07-27 DIAGNOSIS — Z720 Tobacco use: Secondary | ICD-10-CM

## 2014-07-27 DIAGNOSIS — E875 Hyperkalemia: Secondary | ICD-10-CM

## 2014-07-27 DIAGNOSIS — I272 Pulmonary hypertension, unspecified: Secondary | ICD-10-CM | POA: Diagnosis present

## 2014-07-27 LAB — BLOOD GAS, ARTERIAL
Acid-Base Excess: 12 mmol/L — ABNORMAL HIGH (ref 0.0–2.0)
Bicarbonate: 41.6 mEq/L — ABNORMAL HIGH (ref 20.0–24.0)
Drawn by: 295031
FIO2: 0.55 %
O2 Saturation: 89.4 %
Patient temperature: 98.6
TCO2: 36.9 mmol/L (ref 0–100)
pCO2 arterial: 79.6 mmHg (ref 35.0–45.0)
pH, Arterial: 7.338 — ABNORMAL LOW (ref 7.350–7.450)
pO2, Arterial: 61.6 mmHg — ABNORMAL LOW (ref 80.0–100.0)

## 2014-07-27 LAB — BASIC METABOLIC PANEL
Anion gap: 12 (ref 5–15)
BUN: 13 mg/dL (ref 6–23)
CO2: 32 mmol/L (ref 19–32)
Calcium: 8.2 mg/dL — ABNORMAL LOW (ref 8.4–10.5)
Chloride: 94 mEq/L — ABNORMAL LOW (ref 96–112)
Creatinine, Ser: 0.7 mg/dL (ref 0.50–1.10)
GFR calc Af Amer: >90 mL/min (ref >90)
GFR calc non Af Amer: >90 mL/min (ref >90)
Glucose, Bld: 53 mg/dL — ABNORMAL LOW (ref 70–99)
Potassium: 4 mmol/L (ref 3.5–5.1)
Sodium: 138 mmol/L (ref 135–145)

## 2014-07-27 LAB — PROCALCITONIN: Procalcitonin: <0.1 ng/mL

## 2014-07-27 MED ORDER — METHYLPREDNISOLONE SODIUM SUCC 125 MG IJ SOLR
60.0000 mg | Freq: Two times a day (BID) | INTRAMUSCULAR | Status: DC
  Administered 2014-07-27 (×2): 60 mg via INTRAVENOUS
  Filled 2014-07-27 (×2): qty 0.96
  Filled 2014-07-27: qty 2
  Filled 2014-07-27 (×2): qty 0.96

## 2014-07-27 MED ORDER — TECHNETIUM TC 99M DIETHYLENETRIAME-PENTAACETIC ACID: 43.7000 | Freq: Once | INTRAVENOUS | Status: AC | PRN

## 2014-07-27 MED ORDER — DOXYCYCLINE HYCLATE 100 MG PO TABS: 100.0000 mg | ORAL_TABLET | Freq: Two times a day (BID) | ORAL | Status: DC

## 2014-07-27 MED ORDER — ASPIRIN 81 MG PO CHEW
81.0000 mg | CHEWABLE_TABLET | Freq: Every day | ORAL | Status: DC
  Administered 2014-07-28 – 2014-08-04 (×8): 81 mg via ORAL
  Filled 2014-07-27 (×8): qty 1

## 2014-07-27 MED ORDER — TECHNETIUM TO 99M ALBUMIN AGGREGATED
5.4000 | Freq: Once | INTRAVENOUS | Status: AC | PRN
  Administered 2014-07-27: 5 via INTRAVENOUS

## 2014-07-27 MED ORDER — ASPIRIN 81 MG PO CHEW: 81.0000 mg | CHEWABLE_TABLET | Freq: Every day | ORAL | Status: DC

## 2014-07-27 MED ORDER — DOXYCYCLINE HYCLATE 100 MG IV SOLR
100.0000 mg | Freq: Two times a day (BID) | INTRAVENOUS | Status: DC
  Filled 2014-07-27: qty 100

## 2014-07-27 MED ORDER — METOPROLOL TARTRATE 25 MG PO TABS
12.5000 mg | ORAL_TABLET | Freq: Two times a day (BID) | ORAL | Status: DC
  Administered 2014-07-27 – 2014-08-04 (×17): 12.5 mg via ORAL
  Filled 2014-07-27 (×17): qty 1

## 2014-07-27 NOTE — Progress Notes (Signed)
Pt transported to tele. All pt belongings including laptop and chart paperwork with pt. PT v/s stable with no assessment of any acute distress. Pt transported on 8L [place].

## 2014-07-27 NOTE — Consult Note (Signed)
PULMONARY  / [place]   Name: Deborah [name] MRN: [id number] DOB: 1956/04/09    ADMISSION DATE:  07/25/2014 CONSULTATION DATE: 07/25/14  REQUESTING CLINICIAN: Dr. Verlon Au PRIMARY SERVICE: [facility]  CHIEF COMPLAINT:  SOB, edema  BRIEF PATIENT DESCRIPTION: 52 yof with PMH of morbid obesity, mild COPD, OSA presents with hypercarbic, hypoxic resp failure likely due to volume overload from previously undx CHF.  SIGNIFICANT EVENTS / STUDIES:  1/09  Admitted, placed on Bipap, CXR with enlarged cardiac silhouette  and ? RLL infiltrate vs atelectasis 1/10  ECHO >> nml LV systolic fxn, dilated hypocontractile RV c/w cor pulmonale 1/11  Tx to med floor  CULTURES: BCx2 1/9 >>  ANTIBIOTICS: Ceftriaxone x 1 dose 1/9 Azithromycin 1/9 >> 1/10 Doxycycline 1/11 >>  SUBJECTIVE: Pt reports feeling much better, improved swelling, breathing than on admit.     VITAL SIGNS: Temp:  [97.5 F (36.4 C)-98.6 F (37 C)] 97.7 F (36.5 C) (01/11 0800) Pulse Rate:  [86-97] 89 (01/11 0800) Resp:  [13-27] 16 (01/11 0800) BP: (83-116)/(48-71) 102/64 mmHg (01/11 0839) SpO2:  [92 %-98 %] 93 % (01/11 0800) FiO2 (%):  [45 %] 45 % (01/10 1549)  VENTILATOR SETTINGS: Vent Mode:  [-] BIPAP FiO2 (%):  [45 %] 45 % PEEP:  [5 cmH20] 5 cmH20  INTAKE / OUTPUT: Intake/Output      01/10 0701 - 01/11 0700 01/11 0701 - 01/12 0700   P.O. 600    I.V. (mL/kg) 88.3 (0.7) 4 (0)   Other 110 5   IV Piggyback     Total Intake(mL/kg) 798.3 (6.4) 9 (0.1)   Urine (mL/kg/hr) 4325 (1.4)    Total Output 4325     Net -3526.7 +9          PHYSICAL EXAMINATION: General:  Obese, on bipap, NAD Neuro:  Alert and Ox3, nonfocal HEENT:  Mm pink/moist, PERRL, short/thick neck Cardiovascular:  Tachy,no m/r/g but distant heart sounds Lungs:  resp's even/non-labored on 5L, lungs bilaterally coarse with rhonchi with exp wheezing Abdomen:  Protuberant, +BS Musculoskeletal:  2+ LE edema  LABS:  CBC  Recent  Labs Lab 07/25/14 1534 07/26/14 0425  WBC 7.5 6.9  HGB 14.7 15.1*  HCT 52.6* 52.6*  PLT 208 164   BMET  Recent Labs Lab 07/25/14 1801 07/26/14 0425 07/27/14 0330  NA 134* 136 138  K 4.7 4.8 4.0  CL 94* 93* 94*  CO2 30 35* 32  BUN 15 13 13   CREATININE 0.74 0.76 0.70  GLUCOSE 100* 120* 53*   Electrolytes  Recent Labs Lab 07/25/14 1801 07/26/14 0425 07/27/14 0330  CALCIUM 8.2* 8.1* 8.2*   Sepsis Markers  Recent Labs Lab 07/25/14 1801 07/27/14 0330  LATICACIDVEN 1.4  --   PROCALCITON <0.10 <0.10   ABG  Recent Labs Lab 07/25/14 1800 07/25/14 1944 07/26/14 0834  PHART 7.268* 7.258* 7.330*  PCO2ART 72.4* 75.9* 68.8*  PO2ART 55.4* 63.9* 62.8*   Liver Enzymes  Recent Labs Lab 07/25/14 1534 07/25/14 1801 07/26/14 0425  AST 36 33 31  ALT 30 31 28   ALKPHOS 85 80 72  BILITOT 0.6 0.4 0.4  ALBUMIN 3.0* 2.9* 2.8*   Cardiac Enzymes  Recent Labs Lab 07/25/14 1534 07/25/14 2130 07/26/14 0425  TROPONINI <0.03 <0.03 <0.03   Imaging Dg Chest 2 View  07/25/2014   CLINICAL DATA:  Shortness of breath, bilateral leg swelling  EXAM: CHEST  2 VIEW  COMPARISON:  04/08/2004  FINDINGS: Cardiomegaly.  No frank interstitial edema.  Mild  patchy opacity overlying the right lower hemithorax, atelectasis versus pneumonia, although some of this likely reflects overlying soft tissues.  No pleural effusion or pneumothorax.  IMPRESSION: Cardiomegaly.  No frank interstitial edema.  Right lower lobe opacity, atelectasis versus pneumonia.   Electronically Signed   By: Julian Hy M.D.   On: 07/25/2014 16:39    ASSESSMENT / PLAN:  PULMONARY A: Acute Hypoxic, hypercarbic Resp Failure - secondary to CHF and volume overload.  Suspect R heart failure and secondary PAH are largest issue here ?? RML CAP vs atx and collapse; clinical course inconsistent w infection Mild COPD - reportedly has had PFT and was told "her lungs were fine".  I dont have access to PFT at this time but  suspect that she has mixed disease.  Tobacco Abuse - ongoing, hx up to 3 ppd, currently 1/2 ppd  P:   Lasix gtt with suspicion of cor pulmonale as largest contributor Strict I's and O's with foley in place Solu-medrol  60 Q12 Continue Albuterol, Singular, Spiriva, Chantix  BiPAP PRN  Albuterol q6 nebs and duonebs prn q4 Follow Sputum cx Ambulatory assessment of O2 needs prior to discharge Will need outpatient sleep study  Smoking cessation  VQ Scan to r/o chronic thromboembolic disease  Monitor off abx   CARDIOVASCULAR A: Probable PAH with cor pulmonale Possible new dx CHF, unclear diastolic or systolic or both at this time P:   Appreciate Cardiology input Aggressive diuresis as BP / Sr CR tolerate  Will need to manage PAH underlying issues, especially OHS Monitor lytes  Tele  RENAL A: No acute issues P:   Monitor I's and O's Trend BMP with lasix gtt  GASTROINTESTINAL A: Morbid Obesity  P:   Fluid restricted diet  HEMATOLOGIC A: No acute issues P:   Monitor CBC   INFECTIOUS A: Possible RML CAP P:   Will d/c abx 1/11 and follow clinically.  PCT protocol (neg)  NEUROLOGIC A: Bipolar Disorder P:   Per Primary S[facility]   Noe Gens, NP-C [facility] Pgr: [phone] or [phone]

## 2014-07-27 NOTE — Progress Notes (Signed)
Subjective:  Breathing better off of BiPAP  Objective:  Temp:  [97.5 F (36.4 C)-98.6 F (37 C)] 97.8 F (36.6 C) (01/10 1938) Pulse Rate:  [86-97] 87 (01/11 0700) Resp:  [13-27] 23 (01/11 0700) BP: (83-116)/(48-71) 99/62 mmHg (01/11 0700) SpO2:  [92 %-100 %] 96 % (01/11 0700) FiO2 (%):  [45 %] 45 % (01/10 1549) Weight change:   Intake/Output from previous day: 01/10 0701 - 01/11 0700 In: 798.3 [P.O.:600; I.V.:88.3] Out: 4325 [Urine:4325]  Intake/Output from this shift:    Physical Exam: General appearance: alert and no distress Neck: no adenopathy, no carotid bruit, no JVD, supple, symmetrical, trachea midline and thyroid not enlarged, symmetric, no tenderness/mass/nodules Lungs: scattered wheezes and rhonchi Heart: regular rate and rhythm, S1, S2 normal, no murmur, click, rub or gallop Extremities: 1+ ankle edema  Lab Results: Results for orders placed or performed during the hospital encounter of 07/25/14 (from the past 48 hour(s))  CBC with Differential     Status: Abnormal   Collection Time: 07/25/14  3:34 PM  Result Value Ref Range   WBC 7.5 4.0 - 10.5 K/uL   RBC 5.98 (H) 3.87 - 5.11 MIL/uL   Hemoglobin 14.7 12.0 - 15.0 g/dL   HCT 52.6 (H) 36.0 - 46.0 %   MCV 88.0 78.0 - 100.0 fL   MCH 24.6 (L) 26.0 - 34.0 pg   MCHC 27.9 (L) 30.0 - 36.0 g/dL   RDW 18.8 (H) 11.5 - 15.5 %   Platelets 208 150 - 400 K/uL   Neutrophils Relative % 69 43 - 77 %   Neutro Abs 5.1 1.7 - 7.7 K/uL   Lymphocytes Relative 19 12 - 46 %   Lymphs Abs 1.4 0.7 - 4.0 K/uL   Monocytes Relative 10 3 - 12 %   Monocytes Absolute 0.8 0.1 - 1.0 K/uL   Eosinophils Relative 2 0 - 5 %   Eosinophils Absolute 0.1 0.0 - 0.7 K/uL   Basophils Relative 0 0 - 1 %   Basophils Absolute 0.0 0.0 - 0.1 K/uL  Comprehensive metabolic panel     Status: Abnormal   Collection Time: 07/25/14  3:34 PM  Result Value Ref Range   Sodium 135 135 - 145 mmol/L    Comment: Please note change in reference range.   Potassium 5.3 (H) 3.5 - 5.1 mmol/L    Comment: Please note change in reference range.   Chloride 97 96 - 112 mEq/L   CO2 34 (H) 19 - 32 mmol/L   Glucose, Bld 84 70 - 99 mg/dL   BUN 16 6 - 23 mg/dL   Creatinine, Ser 0.77 0.50 - 1.10 mg/dL   Calcium 8.2 (L) 8.4 - 10.5 mg/dL   Total Protein 7.1 6.0 - 8.3 g/dL   Albumin 3.0 (L) 3.5 - 5.2 g/dL   AST 36 0 - 37 U/L   ALT 30 0 - 35 U/L   Alkaline Phosphatase 85 39 - 117 U/L   Total Bilirubin 0.6 0.3 - 1.2 mg/dL   GFR calc non Af Amer >90 >90 mL/min   GFR calc Af Amer >90 >90 mL/min    Comment: (NOTE) The eGFR has been calculated using the CKD EPI equation. This calculation has not been validated in all clinical situations. eGFR's persistently <90 mL/min signify possible Chronic Kidney Disease.    Anion gap 4 (L) 5 - 15  Troponin I     Status: None   Collection Time: 07/25/14  3:34 PM  Result  Value Ref Range   Troponin I <0.03 <0.031 ng/mL    Comment:        NO INDICATION OF MYOCARDIAL INJURY. Please note change in reference range.   Brain natriuretic peptide     Status: Abnormal   Collection Time: 07/25/14  3:34 PM  Result Value Ref Range   B Natriuretic Peptide 296.8 (H) 0.0 - 100.0 pg/mL    Comment: Please note change in reference range.  Culture, blood (routine x 2)     Status: None (Preliminary result)   Collection Time: 07/25/14  5:14 PM  Result Value Ref Range   Specimen Description BLOOD LEFT ANTECUBITAL    Special Requests BOTTLES DRAWN AEROBIC AND ANAEROBIC 5 CC EACH    Culture             BLOOD CULTURE RECEIVED NO GROWTH TO DATE CULTURE WILL BE HELD FOR 5 DAYS BEFORE ISSUING A FINAL NEGATIVE REPORT Performed at [facility]    Report Status PENDING   Culture, blood (routine x 2)     Status: None (Preliminary result)   Collection Time: 07/25/14  5:14 PM  Result Value Ref Range   Specimen Description BLOOD RIGHT ANTECUBITAL    Special Requests BOTTLES DRAWN AEROBIC AND ANAEROBIC 5 CC EACH    Culture              BLOOD CULTURE RECEIVED NO GROWTH TO DATE CULTURE WILL BE HELD FOR 5 DAYS BEFORE ISSUING A FINAL NEGATIVE REPORT Performed at [facility]    Report Status PENDING   Blood gas, arterial     Status: Abnormal   Collection Time: 07/25/14  6:00 PM  Result Value Ref Range   O2 Content 5.0 L/min   Delivery systems NASAL CANNULA    pH, Arterial 7.268 (L) 7.350 - 7.450   pCO2 arterial 72.4 (HH) 35.0 - 45.0 mmHg    Comment: CRITICAL RESULT CALLED TO, READ BACK BY AND VERIFIED WITH: DR Myna Bright AT 1816 BY KENNAN DEPUE,RRT,RCP ON 07/25/2014    pO2, Arterial 55.4 (L) 80.0 - 100.0 mmHg   Bicarbonate 32.0 (H) 20.0 - 24.0 mEq/L   TCO2 28.7 0 - 100 mmol/L   Acid-Base Excess 2.7 (H) 0.0 - 2.0 mmol/L   O2 Saturation 85.0 %   Patient temperature 98.6    Collection site RIGHT RADIAL    Drawn by [phone]    Sample type ARTERIAL DRAW    Allens test (pass/fail) PASS PASS  Hemoglobin A1c     Status: Abnormal   Collection Time: 07/25/14  6:00 PM  Result Value Ref Range   Hgb A1c MFr Bld 6.2 (H) <5.7 %    Comment: (NOTE)                                                                       According to the ADA Clinical Practice Recommendations for 2011, when HbA1c is used as a screening test:  >=6.5%   Diagnostic of Diabetes Mellitus           (if abnormal result is confirmed) 5.7-6.4%   Increased risk of developing Diabetes Mellitus References:Diagnosis and Classification of Diabetes Mellitus,Diabetes WUGQ,9169,45(WTUUE 1):S62-S69 and Standards of Medical Care in  Diabetes - 2011,Diabetes Care,2011,34 (Suppl 1):S11-S61.    Mean Plasma Glucose 131 (H) <117 mg/dL    Comment: Performed at [facility]  Procalcitonin - Baseline     Status: None   Collection Time: 07/25/14  6:01 PM  Result Value Ref Range   Procalcitonin <0.10 ng/mL    Comment:        Interpretation: PCT (Procalcitonin) <= 0.5 ng/mL: Systemic infection (sepsis) is not likely. Local bacterial infection is  possible. (NOTE)         ICU PCT Algorithm               Non ICU PCT Algorithm    ----------------------------     ------------------------------         PCT < 0.25 ng/mL                 PCT < 0.1 ng/mL     Stopping of antibiotics            Stopping of antibiotics       strongly encouraged.               strongly encouraged.    ----------------------------     ------------------------------       PCT level decrease by               PCT < 0.25 ng/mL       >= 80% from peak PCT       OR PCT 0.25 - 0.5 ng/mL          Stopping of antibiotics                                             encouraged.     Stopping of antibiotics           encouraged.    ----------------------------     ------------------------------       PCT level decrease by              PCT >= 0.25 ng/mL       < 80% from peak PCT        AND PCT >= 0.5 ng/mL            Continuin g antibiotics                                              encouraged.       Continuing antibiotics            encouraged.    ----------------------------     ------------------------------     PCT level increase compared          PCT > 0.5 ng/mL         with peak PCT AND          PCT >= 0.5 ng/mL             Escalation of antibiotics                                          strongly encouraged.      Escalation of antibiotics  strongly encouraged.   Lactic acid, plasma     Status: None   Collection Time: 07/25/14  6:01 PM  Result Value Ref Range   Lactic Acid, Venous 1.4 0.5 - 2.2 mmol/L  Comprehensive metabolic panel     Status: Abnormal   Collection Time: 07/25/14  6:01 PM  Result Value Ref Range   Sodium 134 (L) 135 - 145 mmol/L    Comment: Please note change in reference range.   Potassium 4.7 3.5 - 5.1 mmol/L    Comment: Please note change in reference range.   Chloride 94 (L) 96 - 112 mEq/L   CO2 30 19 - 32 mmol/L   Glucose, Bld 100 (H) 70 - 99 mg/dL   BUN 15 6 - 23 mg/dL   Creatinine, Ser 0.74 0.50 - 1.10 mg/dL   Calcium  8.2 (L) 8.4 - 10.5 mg/dL   Total Protein 6.9 6.0 - 8.3 g/dL   Albumin 2.9 (L) 3.5 - 5.2 g/dL   AST 33 0 - 37 U/L   ALT 31 0 - 35 U/L   Alkaline Phosphatase 80 39 - 117 U/L   Total Bilirubin 0.4 0.3 - 1.2 mg/dL   GFR calc non Af Amer >90 >90 mL/min   GFR calc Af Amer >90 >90 mL/min    Comment: (NOTE) The eGFR has been calculated using the CKD EPI equation. This calculation has not been validated in all clinical situations. eGFR's persistently <90 mL/min signify possible Chronic Kidney Disease.    Anion gap 10 5 - 15  MRSA PCR Screening     Status: None   Collection Time: 07/25/14  6:33 PM  Result Value Ref Range   MRSA by PCR NEGATIVE NEGATIVE    Comment:        The GeneXpert MRSA Assay (FDA approved for NASAL specimens only), is one component of a comprehensive MRSA colonization surveillance program. It is not intended to diagnose MRSA infection nor to guide or monitor treatment for MRSA infections.   Blood gas, arterial     Status: Abnormal   Collection Time: 07/25/14  7:44 PM  Result Value Ref Range   FIO2 0.50 %   Delivery systems BILEVEL POSITIVE AIRWAY PRESSURE     Comment: SERVO I   Mode PRESSURE CONTROL     Comment: PC NIV   Rate 12 resp/min   Peep/cpap 5.0 cm H20   Pressure control 5 cm H20   pH, Arterial 7.258 (L) 7.350 - 7.450   pCO2 arterial 75.9 (HH) 35.0 - 45.0 mmHg    Comment: CRITICAL RESULT CALLED TO, READ BACK BY AND VERIFIED WITH: ALEX KROLICZAK,RN AT 6629 BY AMY RAY,RRT,RCP ON 07/25/14    pO2, Arterial 63.9 (L) 80.0 - 100.0 mmHg   Bicarbonate 32.9 (H) 20.0 - 24.0 mEq/L   TCO2 29.5 0 - 100 mmol/L   Acid-Base Excess 3.0 (H) 0.0 - 2.0 mmol/L   O2 Saturation 89.5 %   Patient temperature 98.3    Collection site LEFT RADIAL    Drawn by [id number]    Sample type ARTERIAL DRAW    Allens test (pass/fail) PASS PASS  Troponin I     Status: None   Collection Time: 07/25/14  9:30 PM  Result Value Ref Range   Troponin I <0.03 <0.031 ng/mL    Comment:         NO INDICATION OF MYOCARDIAL INJURY. Please note change in reference range.   TSH     Status: None   Collection Time:  07/25/14  9:30 PM  Result Value Ref Range   TSH 2.289 0.350 - 4.500 uIU/mL    Comment: Performed at [facility]  Comprehensive metabolic panel     Status: Abnormal   Collection Time: 07/26/14  4:25 AM  Result Value Ref Range   Sodium 136 135 - 145 mmol/L    Comment: Please note change in reference range.   Potassium 4.8 3.5 - 5.1 mmol/L    Comment: Please note change in reference range.   Chloride 93 (L) 96 - 112 mEq/L   CO2 35 (H) 19 - 32 mmol/L   Glucose, Bld 120 (H) 70 - 99 mg/dL   BUN 13 6 - 23 mg/dL   Creatinine, Ser 0.76 0.50 - 1.10 mg/dL   Calcium 8.1 (L) 8.4 - 10.5 mg/dL   Total Protein 6.6 6.0 - 8.3 g/dL   Albumin 2.8 (L) 3.5 - 5.2 g/dL   AST 31 0 - 37 U/L   ALT 28 0 - 35 U/L   Alkaline Phosphatase 72 39 - 117 U/L   Total Bilirubin 0.4 0.3 - 1.2 mg/dL   GFR calc non Af Amer >90 >90 mL/min   GFR calc Af Amer >90 >90 mL/min    Comment: (NOTE) The eGFR has been calculated using the CKD EPI equation. This calculation has not been validated in all clinical situations. eGFR's persistently <90 mL/min signify possible Chronic Kidney Disease.    Anion gap 8 5 - 15  CBC     Status: Abnormal   Collection Time: 07/26/14  4:25 AM  Result Value Ref Range   WBC 6.9 4.0 - 10.5 K/uL   RBC 5.94 (H) 3.87 - 5.11 MIL/uL   Hemoglobin 15.1 (H) 12.0 - 15.0 g/dL   HCT 52.6 (H) 36.0 - 46.0 %   MCV 88.6 78.0 - 100.0 fL   MCH 25.4 (L) 26.0 - 34.0 pg   MCHC 28.7 (L) 30.0 - 36.0 g/dL   RDW 18.7 (H) 11.5 - 15.5 %   Platelets 164 150 - 400 K/uL  Troponin I     Status: None   Collection Time: 07/26/14  4:25 AM  Result Value Ref Range   Troponin I <0.03 <0.031 ng/mL    Comment:        NO INDICATION OF MYOCARDIAL INJURY. Please note change in reference range.   Brain natriuretic peptide     Status: Abnormal   Collection Time: 07/26/14  4:25 AM  Result Value  Ref Range   B Natriuretic Peptide 213.3 (H) 0.0 - 100.0 pg/mL    Comment: Please note change in reference range.  Blood gas, arterial     Status: Abnormal   Collection Time: 07/26/14  8:34 AM  Result Value Ref Range   FIO2 0.45 %   Delivery systems VENTILATOR    Mode BILEVEL POSITIVE AIRWAY PRESSURE    Inspiratory PAP 15    Expiratory PAP 5    pH, Arterial 7.330 (L) 7.350 - 7.450   pCO2 arterial 68.8 (HH) 35.0 - 45.0 mmHg    Comment: CRITICAL RESULT CALLED TO, READ BACK BY AND VERIFIED WITH: CALLED TO DR.ORTIZ BY ANGIE DUNLAP RRT RCP ON 07/26/14 AT 0837    pO2, Arterial 62.8 (L) 80.0 - 100.0 mmHg   Bicarbonate 35.3 (H) 20.0 - 24.0 mEq/L   TCO2 31.4 0 - 100 mmol/L   Acid-Base Excess 7.0 (H) 0.0 - 2.0 mmol/L   O2 Saturation 90.7 %   Patient temperature 98.6  Collection site LEFT RADIAL    Drawn by COLLECTED BY RT    Sample type ARTERIAL DRAW    Allens test (pass/fail) PASS PASS  Procalcitonin     Status: None   Collection Time: 07/27/14  3:30 AM  Result Value Ref Range   Procalcitonin <0.10 ng/mL    Comment:        Interpretation: PCT (Procalcitonin) <= 0.5 ng/mL: Systemic infection (sepsis) is not likely. Local bacterial infection is possible. (NOTE)         ICU PCT Algorithm               Non ICU PCT Algorithm    ----------------------------     ------------------------------         PCT < 0.25 ng/mL                 PCT < 0.1 ng/mL     Stopping of antibiotics            Stopping of antibiotics       strongly encouraged.               strongly encouraged.    ----------------------------     ------------------------------       PCT level decrease by               PCT < 0.25 ng/mL       >= 80% from peak PCT       OR PCT 0.25 - 0.5 ng/mL          Stopping of antibiotics                                             encouraged.     Stopping of antibiotics           encouraged.    ----------------------------     ------------------------------       PCT level decrease by               PCT >= 0.25 ng/mL       < 80% from peak PCT        AND PCT >= 0.5 ng/mL            Continuin g antibiotics                                              encouraged.       Continuing antibiotics            encouraged.    ----------------------------     ------------------------------     PCT level increase compared          PCT > 0.5 ng/mL         with peak PCT AND          PCT >= 0.5 ng/mL             Escalation of antibiotics                                          strongly encouraged.      Escalation of antibiotics  strongly encouraged.     Imaging: Imaging results have been reviewed  Tele- NSR  Assessment/Plan:   1. Principal Problem: 2.   Acute respiratory failure with hypoxia and hypercarbia 3. Active Problems: 4.   Shortness of breath 5.   CHF, acute on chronic, unknown EF 6.   Hypertension 7.   Hyperkalemia 8.   CRLD (chronic restrictive lung disease) secondary to super morbid obesity with mild COPD component 9.   Tobacco abuse disorder 10.   Bipolar 1 disorder 11.   Acute respiratory acidosis 12.   Time Spent Directly with Patient:  20 minutes  Length of Stay:  LOS: 2 days   Admitted for acute respiratory failure. Pt has >120 pack years of smoking (currently 1/2 ppd down from 3ppd). BNP low. CXR does not show CHF. On lasix drip with I/O -5.6 liters. Feeling better. 2D shows nl LV systolic function with a dilated hypocontractile RV ( PSP only 38) c/w cor pulmonale (secondary to probable COPD, OSA, ? Chronic PE). Would probably do CTA to R/O chronic pulmonary embolic disease and if neg Rx with diuretics and bronchodilators as you are doing.  Lorretta Harp 07/27/2014, 7:27 AM

## 2014-07-27 NOTE — Progress Notes (Signed)
[facility] PROGRESS NOTE  Filed Weights   07/25/14 1746 07/25/14 1845 07/26/14 0400  Weight: 119.296 kg (263 lb) 125.6 kg (276 lb 14.4 oz) 125.6 kg (276 lb 14.4 oz)        Intake/Output Summary (Last 24 hours) at 07/27/14 0749 Last data filed at 07/27/14 0600  Gross per 24 hour  Intake 798.27 ml  Output   4325 ml  Net -3526.73 ml     Assessment/Plan: Acute respiratory failure with hypoxia and hypercarbia due to Acute diastolic HF/COPD exacerbation: - Started on the BiPAP overnight and feels comfortable. - Pt relates SOB is improved. Negative and additional 2 L. - she is a chronic retainer. - Continue IV LAsix infusion at the same dose. - 2-d echo showed severely dilate RV, concern for chronic pulmonary embolus, she does have a long history of tabacco abuse. - check a V/Q scan which has better sensitivity for Chronic PE. - wheezing on Physical exam start start steroids and doxycycline.  Essential  Hypertension - Her blood pressure has improved with diuresis.  - start beta blocker.  Hyperkalemia - Resolved.   CRLD (chronic restrictive lung disease) secondary to super morbid obesity with mild COPD component - Continue current home medications.  Tobacco abuse disorder: - Counseling.  Bipolar 1 disorder: - On no medications at home. - pharmacy to due med rec.   Code Status: full Family Communication: husband  Disposition Plan: inpatient   Consultants:  cardiology  Procedures: ECHO: diastolic HF, severly dilated RV, elevated Pulm pressure.  Antibiotics:  rocephin  HPI/Subjective: Breathing is better  Objective: Filed Vitals:   07/27/14 0400 07/27/14 0500 07/27/14 0600 07/27/14 0700  BP: 96/59 102/63 103/62 99/62  Pulse: 88 89 92 87  Temp:      TempSrc:      Resp: 17 25 18 23   Height:      Weight:      SpO2: 93% 96% 98% 96%     Exam:  General: Alert, awake, oriented x3, in no acute distress.  HEENT: No bruits, no goiter.  Heart:  Regular rate and rhythm distant heart sounds  Lungs: Good air movement,  Wheezing B/L Abdomen: Soft, nontender, nondistended, positive bowel sounds. 3+ edema in her sacrum.     Data Reviewed: Basic Metabolic Panel:  Recent Labs Lab 07/25/14 1534 07/25/14 1801 07/26/14 0425  NA 135 134* 136  K 5.3* 4.7 4.8  CL 97 94* 93*  CO2 34* 30 35*  GLUCOSE 84 100* 120*  BUN 16 15 13   CREATININE 0.77 0.74 0.76  CALCIUM 8.2* 8.2* 8.1*   Liver Function Tests:  Recent Labs Lab 07/25/14 1534 07/25/14 1801 07/26/14 0425  AST 36 33 31  ALT 30 31 28   ALKPHOS 85 80 72  BILITOT 0.6 0.4 0.4  PROT 7.1 6.9 6.6  ALBUMIN 3.0* 2.9* 2.8*   No results for input(s): LIPASE, AMYLASE in the last 168 hours. No results for input(s): AMMONIA in the last 168 hours. CBC:  Recent Labs Lab 07/25/14 1534 07/26/14 0425  WBC 7.5 6.9  NEUTROABS 5.1  --   HGB 14.7 15.1*  HCT 52.6* 52.6*  MCV 88.0 88.6  PLT 208 164   Cardiac Enzymes:  Recent Labs Lab 07/25/14 1534 07/25/14 2130 07/26/14 0425  TROPONINI <0.03 <0.03 <0.03   BNP (last 3 results) No results for input(s): PROBNP in the last 8760 hours. CBG: No results for input(s): GLUCAP in the last 168 hours.  Recent Results (from the past 240 hour(s))  Culture, blood (routine x 2)     Status: None (Preliminary result)   Collection Time: 07/25/14  5:14 PM  Result Value Ref Range Status   Specimen Description BLOOD LEFT ANTECUBITAL  Final   Special Requests BOTTLES DRAWN AEROBIC AND ANAEROBIC 5 CC EACH  Final   Culture   Final           BLOOD CULTURE RECEIVED NO GROWTH TO DATE CULTURE WILL BE HELD FOR 5 DAYS BEFORE ISSUING A FINAL NEGATIVE REPORT Performed at [facility]    Report Status PENDING  Incomplete  Culture, blood (routine x 2)     Status: None (Preliminary result)   Collection Time: 07/25/14  5:14 PM  Result Value Ref Range Status   Specimen Description BLOOD RIGHT ANTECUBITAL  Final   Special Requests BOTTLES DRAWN  AEROBIC AND ANAEROBIC 5 CC EACH  Final   Culture   Final           BLOOD CULTURE RECEIVED NO GROWTH TO DATE CULTURE WILL BE HELD FOR 5 DAYS BEFORE ISSUING A FINAL NEGATIVE REPORT Performed at [facility]    Report Status PENDING  Incomplete  MRSA PCR Screening     Status: None   Collection Time: 07/25/14  6:33 PM  Result Value Ref Range Status   MRSA by PCR NEGATIVE NEGATIVE Final    Comment:        The GeneXpert MRSA Assay (FDA approved for NASAL specimens only), is one component of a comprehensive MRSA colonization surveillance program. It is not intended to diagnose MRSA infection nor to guide or monitor treatment for MRSA infections.      Studies: Dg Chest 2 View  07/25/2014   CLINICAL DATA:  Shortness of breath, bilateral leg swelling  EXAM: CHEST  2 VIEW  COMPARISON:  04/08/2004  FINDINGS: Cardiomegaly.  No frank interstitial edema.  Mild patchy opacity overlying the right lower hemithorax, atelectasis versus pneumonia, although some of this likely reflects overlying soft tissues.  No pleural effusion or pneumothorax.  IMPRESSION: Cardiomegaly.  No frank interstitial edema.  Right lower lobe opacity, atelectasis versus pneumonia.   Electronically Signed   By: Julian Hy M.D.   On: 07/25/2014 16:39    Scheduled Meds: . albuterol  5 mg Nebulization Q6H  . antiseptic oral rinse  7 mL Mouth Rinse q12n4p  . aspirin EC  325 mg Oral Daily  . chlorhexidine  15 mL Mouth Rinse BID  . heparin  5,000 Units Subcutaneous 3 times per day  . Influenza vac split quadrivalent PF  0.5 mL Intramuscular Tomorrow-1000  . lisinopril  5 mg Oral Daily  . montelukast  10 mg Oral QHS  . nicotine  14 mg Transdermal Daily  . pneumococcal 23 valent vaccine  0.5 mL Intramuscular Tomorrow-1000  . sodium chloride  3 mL Intravenous Q12H  . tiotropium  18 mcg Inhalation Daily  . varenicline  1 mg Oral Daily   Continuous Infusions: . furosemide (LASIX) infusion 4 mg/hr (07/26/14 1900)      Charlynne Cousins  [facility] Pager [phone] If 8PM-8AM, please contact night-coverage at [email or web address], password [facility] 07/27/2014, 7:49 AM  LOS: 2 days

## 2014-07-27 NOTE — Progress Notes (Signed)
Pt placed on CPAP QHS.  Machine plugged into red outlet, humidifier filled with sterile water and Pt comfortable.  Settings are automode with min-5 and max-20

## 2014-07-27 NOTE — Progress Notes (Signed)
CSW received referral that pt needing assistance with medication needs.  Inappropriate CSW referral.  CSW notified RNCM.  CSW signing off.   Please re-consult if social work needs arise.   Alison Murray, MSW, [place] Work [phone]

## 2014-07-28 ENCOUNTER — Inpatient Hospital Stay (HOSPITAL_COMMUNITY): Payer: 59

## 2014-07-28 DIAGNOSIS — F319 Bipolar disorder, unspecified: Secondary | ICD-10-CM

## 2014-07-28 DIAGNOSIS — I5031 Acute diastolic (congestive) heart failure: Secondary | ICD-10-CM | POA: Diagnosis present

## 2014-07-28 DIAGNOSIS — J441 Chronic obstructive pulmonary disease with (acute) exacerbation: Secondary | ICD-10-CM | POA: Diagnosis present

## 2014-07-28 DIAGNOSIS — J984 Other disorders of lung: Secondary | ICD-10-CM

## 2014-07-28 DIAGNOSIS — I10 Essential (primary) hypertension: Secondary | ICD-10-CM

## 2014-07-28 LAB — BASIC METABOLIC PANEL
Anion gap: 8 (ref 5–15)
BUN: 13 mg/dL (ref 6–23)
CO2: 44 mmol/L (ref 19–32)
Calcium: 8.5 mg/dL (ref 8.4–10.5)
Chloride: 89 mEq/L — ABNORMAL LOW (ref 96–112)
Creatinine, Ser: 0.63 mg/dL (ref 0.50–1.10)
GFR calc Af Amer: >90 mL/min (ref >90)
GFR calc non Af Amer: >90 mL/min (ref >90)
Glucose, Bld: 111 mg/dL — ABNORMAL HIGH (ref 70–99)
Potassium: 3.5 mmol/L (ref 3.5–5.1)
Sodium: 141 mmol/L (ref 135–145)

## 2014-07-28 LAB — CBC
HCT: 50.4 % — ABNORMAL HIGH (ref 36.0–46.0)
Hemoglobin: 14.1 g/dL (ref 12.0–15.0)
MCH: 24.7 pg — ABNORMAL LOW (ref 26.0–34.0)
MCHC: 28 g/dL — ABNORMAL LOW (ref 30.0–36.0)
MCV: 88.3 fL (ref 78.0–100.0)
Platelets: 177 10*3/uL (ref 150–400)
RBC: 5.71 MIL/uL — ABNORMAL HIGH (ref 3.87–5.11)
RDW: 18.9 % — ABNORMAL HIGH (ref 11.5–15.5)
WBC: 7.3 10*3/uL (ref 4.0–10.5)

## 2014-07-28 MED ORDER — LOSARTAN POTASSIUM 50 MG PO TABS
50.0000 mg | ORAL_TABLET | Freq: Every day | ORAL | Status: DC
  Administered 2014-07-28 – 2014-08-04 (×8): 50 mg via ORAL
  Filled 2014-07-28 (×8): qty 1

## 2014-07-28 MED ORDER — METHYLPREDNISOLONE SODIUM SUCC 125 MG IJ SOLR
125.0000 mg | Freq: Three times a day (TID) | INTRAMUSCULAR | Status: DC
  Administered 2014-07-28 – 2014-07-30 (×6): 125 mg via INTRAVENOUS
  Filled 2014-07-28 (×9): qty 2

## 2014-07-28 NOTE — Progress Notes (Signed)
Subjective:  Breathing much better with continued diuresis  Objective:  Temp:  [97.5 F (36.4 C)-98.4 F (36.9 C)] 97.5 F (36.4 C) (01/12 0626) Pulse Rate:  [82-95] 82 (01/12 0626) Resp:  [16-20] 18 (01/12 0626) BP: (102-112)/(61-78) 112/78 mmHg (01/12 0626) SpO2:  [89 %-95 %] 91 % (01/12 0802) FiO2 (%):  [55 %] 55 % (01/12 0802) Weight:  [275 lb 9.2 oz (125 kg)-276 lb 14.4 oz (125.6 kg)] 275 lb 9.2 oz (125 kg) (01/12 0626) Weight change:   Intake/Output from previous day: 01/11 0701 - 01/12 0700 In: 57.9 [I.V.:47.9] Out: 3625 [Urine:3625]  Intake/Output from this shift:    Physical Exam: General appearance: alert and no distress Neck: no adenopathy, no carotid bruit, no JVD, supple, symmetrical, trachea midline and thyroid not enlarged, symmetric, no tenderness/mass/nodules Lungs: clear to auscultation bilaterally Heart: regular rate and rhythm, S1, S2 normal, no murmur, click, rub or gallop Extremities: 1-2+ pitting BLE edema L>R  Lab Results: Results for orders placed or performed during the hospital encounter of 07/25/14 (from the past 48 hour(s))  Procalcitonin     Status: None   Collection Time: 07/27/14  3:30 AM  Result Value Ref Range   Procalcitonin <0.10 ng/mL    Comment:        Interpretation: PCT (Procalcitonin) <= 0.5 ng/mL: Systemic infection (sepsis) is not likely. Local bacterial infection is possible. (NOTE)         ICU PCT Algorithm               Non ICU PCT Algorithm    ----------------------------     ------------------------------         PCT < 0.25 ng/mL                 PCT < 0.1 ng/mL     Stopping of antibiotics            Stopping of antibiotics       strongly encouraged.               strongly encouraged.    ----------------------------     ------------------------------       PCT level decrease by               PCT < 0.25 ng/mL       >= 80% from peak PCT       OR PCT 0.25 - 0.5 ng/mL          Stopping of antibiotics                                encouraged.     Stopping of antibiotics           encouraged.    ----------------------------     ------------------------------       PCT level decrease by              PCT >= 0.25 ng/mL       < 80% from peak PCT        AND PCT >= 0.5 ng/mL            Continuin g antibiotics                                              encouraged.  Continuing antibiotics            encouraged.    ----------------------------     ------------------------------     PCT level increase compared          PCT > 0.5 ng/mL         with peak PCT AND          PCT >= 0.5 ng/mL             Escalation of antibiotics                                          strongly encouraged.      Escalation of antibiotics        strongly encouraged.   Basic metabolic panel     Status: Abnormal   Collection Time: 07/27/14  3:30 AM  Result Value Ref Range   Sodium 138 135 - 145 mmol/L    Comment: Please note change in reference range.   Potassium 4.0 3.5 - 5.1 mmol/L    Comment: Please note change in reference range.   Chloride 94 (L) 96 - 112 mEq/L   CO2 32 19 - 32 mmol/L   Glucose, Bld 53 (L) 70 - 99 mg/dL   BUN 13 6 - 23 mg/dL   Creatinine, Ser 0.70 0.50 - 1.10 mg/dL   Calcium 8.2 (L) 8.4 - 10.5 mg/dL   GFR calc non Af Amer >90 >90 mL/min   GFR calc Af Amer >90 >90 mL/min    Comment: (NOTE) The eGFR has been calculated using the CKD EPI equation. This calculation has not been validated in all clinical situations. eGFR's persistently <90 mL/min signify possible Chronic Kidney Disease.    Anion gap 12 5 - 15  Blood gas, arterial     Status: Abnormal   Collection Time: 07/27/14  5:41 PM  Result Value Ref Range   FIO2 0.55 %   Delivery systems VENTURI MASK    pH, Arterial 7.338 (L) 7.350 - 7.450   pCO2 arterial 79.6 (HH) 35.0 - 45.0 mmHg    Comment: CRITICAL RESULT CALLED TO, READ BACK BY AND VERIFIED WITH: A ORTIZ,MD AT 1758 BY KENNAN DEPUE,RRT,RCP ON 07/27/14    pO2, Arterial  61.6 (L) 80.0 - 100.0 mmHg   Bicarbonate 41.6 (H) 20.0 - 24.0 mEq/L   TCO2 36.9 0 - 100 mmol/L   Acid-Base Excess 12.0 (H) 0.0 - 2.0 mmol/L   O2 Saturation 89.4 %   Patient temperature 98.6    Collection site RIGHT BRACHIAL    Drawn by [phone]    Sample type ARTERIAL DRAW    Allens test (pass/fail) PASS PASS  Basic metabolic panel     Status: Abnormal   Collection Time: 07/28/14  5:04 AM  Result Value Ref Range   Sodium 141 135 - 145 mmol/L    Comment: Please note change in reference range.   Potassium 3.5 3.5 - 5.1 mmol/L    Comment: Please note change in reference range.   Chloride 89 (L) 96 - 112 mEq/L   CO2 44 (HH) 19 - 32 mmol/L    Comment: CRITICAL RESULT CALLED TO, READ BACK BY AND VERIFIED WITH: D. BROWN RN AT 0645 ON 01.12.16 BY SHUEA    Glucose, Bld 111 (H) 70 - 99 mg/dL   BUN 13 6 - 23 mg/dL   Creatinine, Ser 0.63 0.50 -  1.10 mg/dL   Calcium 8.5 8.4 - 10.5 mg/dL   GFR calc non Af Amer >90 >90 mL/min   GFR calc Af Amer >90 >90 mL/min    Comment: (NOTE) The eGFR has been calculated using the CKD EPI equation. This calculation has not been validated in all clinical situations. eGFR's persistently <90 mL/min signify possible Chronic Kidney Disease.    Anion gap 8 5 - 15  CBC     Status: Abnormal   Collection Time: 07/28/14  5:04 AM  Result Value Ref Range   WBC 7.3 4.0 - 10.5 K/uL   RBC 5.71 (H) 3.87 - 5.11 MIL/uL   Hemoglobin 14.1 12.0 - 15.0 g/dL   HCT 50.4 (H) 36.0 - 46.0 %   MCV 88.3 78.0 - 100.0 fL   MCH 24.7 (L) 26.0 - 34.0 pg   MCHC 28.0 (L) 30.0 - 36.0 g/dL   RDW 18.9 (H) 11.5 - 15.5 %   Platelets 177 150 - 400 K/uL    Imaging: Imaging results have been reviewed  Tele- NSR  Assessment/Plan:   1. Principal Problem: 2.   Acute respiratory failure with hypoxia and hypercarbia 3. Active Problems: 4.   Shortness of breath 5.   CHF, acute on chronic, unknown EF 6.   Hypertension 7.   Hyperkalemia 8.   CRLD (chronic restrictive lung disease)  secondary to super morbid obesity with mild COPD component 9.   Tobacco abuse disorder 10.   Bipolar 1 disorder 11.   Acute respiratory acidosis 12.   Pulmonary HTN 13.   Time Spent Directly with Patient:  15 minutes  Length of Stay:  LOS: 3 days   Pt continues to diurese on lasix drip. I/O -9L so far. Feeling much better. Still has pitting edema. V/Q neg for PE. RH failure/Pulm HTN probably multifactorial (COPD, OSA). LV fxn good. Can probably transition to PO diuretics tomorrow (80 mg PO BID) and follow I/O, labs. Renal fxn nl. Nothing further to add. Will S/O but be avail for further questions. Needs OP F/U with [place] after D/C.  Lorretta Harp 07/28/2014, 9:14 AM

## 2014-07-28 NOTE — Progress Notes (Signed)
[facility] PROGRESS NOTE  Filed Weights   07/26/14 0400 07/27/14 1500 07/28/14 0626  Weight: 125.6 kg (276 lb 14.4 oz) 125.6 kg (276 lb 14.4 oz) 125 kg (275 lb 9.2 oz)        Intake/Output Summary (Last 24 hours) at 07/28/14 7628 Last data filed at 07/28/14 0900  Gross per 24 hour  Intake 159.93 ml  Output   2925 ml  Net -2765.07 ml     Assessment/Plan: Acute respiratory failure with hypoxia and hypercarbia due to Acute diastolic HF/COPD exacerbation: - Using Bipap ovennight for OSA. - Pt relates SOB is improved. Cont to have good UOP. - Continue IV Lasix ggt at the same dose. - 2-d echo showed severely dilate RV, concern for chronic pulmonary embolus, she does have a long history of tabacco abuse. - V/Q scan negative for Chronic PE. - Still wheezing, increase steroids.  Essential  Hypertension - Her blood pressure has improved with diuresis.  - start beta blocker.  Hyperkalemia - Resolved.   CRLD (chronic restrictive lung disease) secondary to super morbid obesity with mild COPD component - Continue current home medications.  Tobacco abuse disorder: - Counseling.  Bipolar 1 disorder: - On no medications at home. - pharmacy to due med rec.   Code Status: full Family Communication: husband  Disposition Plan: inpatient   Consultants:  cardiology  Procedures: ECHO: diastolic HF, severly dilated RV, elevated Pulm pressure.  Antibiotics:  rocephin  HPI/Subjective: Breathing is better  Objective: Filed Vitals:   07/27/14 2225 07/28/14 0116 07/28/14 0626 07/28/14 0802  BP:   112/78   Pulse: 92  82   Temp:   97.5 F (36.4 C)   TempSrc:   Oral   Resp: 16  18   Height:      Weight:   125 kg (275 lb 9.2 oz)   SpO2: 95% 90% 91% 91%     Exam:  General: Alert, awake, oriented x3, in no acute distress.  HEENT: No bruits, no goiter.  Heart: Regular rate and rhythm distant heart sounds  Lungs: Good air movement, still Wheezing  B/L Abdomen: Soft, nontender, nondistended, positive bowel sounds. 3+ edema in her sacrum.     Data Reviewed: Basic Metabolic Panel:  Recent Labs Lab 07/25/14 1534 07/25/14 1801 07/26/14 0425 07/27/14 0330 07/28/14 0504  NA 135 134* 136 138 141  K 5.3* 4.7 4.8 4.0 3.5  CL 97 94* 93* 94* 89*  CO2 34* 30 35* 32 44*  GLUCOSE 84 100* 120* 53* 111*  BUN 16 15 13 13 13   CREATININE 0.77 0.74 0.76 0.70 0.63  CALCIUM 8.2* 8.2* 8.1* 8.2* 8.5   Liver Function Tests:  Recent Labs Lab 07/25/14 1534 07/25/14 1801 07/26/14 0425  AST 36 33 31  ALT 30 31 28   ALKPHOS 85 80 72  BILITOT 0.6 0.4 0.4  PROT 7.1 6.9 6.6  ALBUMIN 3.0* 2.9* 2.8*   No results for input(s): LIPASE, AMYLASE in the last 168 hours. No results for input(s): AMMONIA in the last 168 hours. CBC:  Recent Labs Lab 07/25/14 1534 07/26/14 0425 07/28/14 0504  WBC 7.5 6.9 7.3  NEUTROABS 5.1  --   --   HGB 14.7 15.1* 14.1  HCT 52.6* 52.6* 50.4*  MCV 88.0 88.6 88.3  PLT 208 164 177   Cardiac Enzymes:  Recent Labs Lab 07/25/14 1534 07/25/14 2130 07/26/14 0425  TROPONINI <0.03 <0.03 <0.03   BNP (last 3 results) No results for input(s): PROBNP in the last  8760 hours. CBG: No results for input(s): GLUCAP in the last 168 hours.  Recent Results (from the past 240 hour(s))  Culture, blood (routine x 2)     Status: None (Preliminary result)   Collection Time: 07/25/14  5:14 PM  Result Value Ref Range Status   Specimen Description BLOOD LEFT ANTECUBITAL  Final   Special Requests BOTTLES DRAWN AEROBIC AND ANAEROBIC 5 CC EACH  Final   Culture   Final           BLOOD CULTURE RECEIVED NO GROWTH TO DATE CULTURE WILL BE HELD FOR 5 DAYS BEFORE ISSUING A FINAL NEGATIVE REPORT Performed at [facility]    Report Status PENDING  Incomplete  Culture, blood (routine x 2)     Status: None (Preliminary result)   Collection Time: 07/25/14  5:14 PM  Result Value Ref Range Status   Specimen Description BLOOD  RIGHT ANTECUBITAL  Final   Special Requests BOTTLES DRAWN AEROBIC AND ANAEROBIC 5 CC EACH  Final   Culture   Final           BLOOD CULTURE RECEIVED NO GROWTH TO DATE CULTURE WILL BE HELD FOR 5 DAYS BEFORE ISSUING A FINAL NEGATIVE REPORT Performed at [facility]    Report Status PENDING  Incomplete  MRSA PCR Screening     Status: None   Collection Time: 07/25/14  6:33 PM  Result Value Ref Range Status   MRSA by PCR NEGATIVE NEGATIVE Final    Comment:        The GeneXpert MRSA Assay (FDA approved for NASAL specimens only), is one component of a comprehensive MRSA colonization surveillance program. It is not intended to diagnose MRSA infection nor to guide or monitor treatment for MRSA infections.      Studies: Nm Pulmonary Perf And Vent  07/27/2014   CLINICAL DATA:  Shortness of breath, pulmonary hypertension.  EXAM: NUCLEAR MEDICINE VENTILATION - PERFUSION LUNG SCAN  TECHNIQUE: Ventilation images were obtained in multiple projections using inhaled aerosol technetium 99 M DTPA. Perfusion images were obtained in multiple projections after intravenous injection of Tc-68m MAA.  RADIOPHARMACEUTICALS:  5.4 mCi Tc-39m DTPA aerosol and 43.7 mCi Tc-40m MAA  COMPARISON:  Chest radiograph of July 25, 2014.  FINDINGS: Ventilation: Multiple defects are noted, but the largest appears to be in the right lower lobe.  Perfusion: Predominantly linear defect is seen involving the right lung which most likely corresponds to fluid in the right major fissure. No other significant defects are noted. The chest radiograph does demonstrate opacity in the right lower lobe which potentially may involve fluid in the minor and major fissures.  IMPRESSION: Findings most consistent with low probability of pulmonary embolus.   Electronically Signed   By: Sabino Dick M.D.   On: 07/27/2014 13:07    Scheduled Meds: . albuterol  5 mg Nebulization Q6H  . antiseptic oral rinse  7 mL Mouth Rinse q12n4p  . aspirin   81 mg Oral Daily  . chlorhexidine  15 mL Mouth Rinse BID  . heparin  5,000 Units Subcutaneous 3 times per day  . lisinopril  5 mg Oral Daily  . methylPREDNISolone (SOLU-MEDROL) injection  60 mg Intravenous Q12H  . metoprolol tartrate  12.5 mg Oral BID  . montelukast  10 mg Oral QHS  . nicotine  14 mg Transdermal Daily  . sodium chloride  3 mL Intravenous Q12H  . tiotropium  18 mcg Inhalation Daily  . varenicline  1 mg Oral Daily  Continuous Infusions: . furosemide (LASIX) infusion 4 mg/hr (07/27/14 1859)     Charlynne Cousins  [facility] Pager [phone] If 8PM-8AM, please contact night-coverage at [email or web address], password [facility] 07/28/2014, 9:52 AM  LOS: 3 days

## 2014-07-28 NOTE — Progress Notes (Signed)
PULMONARY  / [place]   Name: Deborah [name] MRN: [id number] DOB: [birth year]    ADMISSION DATE:  07/25/2014 CONSULTATION DATE: 07/25/14  REQUESTING CLINICIAN: Dr. Verlon Au PRIMARY SERVICE: [facility]  CHIEF COMPLAINT:  SOB, edema  BRIEF PATIENT DESCRIPTION: 62 yof with PMH of morbid obesity, mild COPD, OSA presents with hypercarbic, hypoxic resp failure likely due to volume overload from previously undx CHF.   CULTURES: Results for orders placed or performed during the hospital encounter of 07/25/14  Culture, blood (routine x 2)     Status: None (Preliminary result)   Collection Time: 07/25/14  5:14 PM  Result Value Ref Range Status   Specimen Description BLOOD LEFT ANTECUBITAL  Final   Special Requests BOTTLES DRAWN AEROBIC AND ANAEROBIC 5 CC EACH  Final   Culture   Final           BLOOD CULTURE RECEIVED NO GROWTH TO DATE CULTURE WILL BE HELD FOR 5 DAYS BEFORE ISSUING A FINAL NEGATIVE REPORT Performed at [facility]    Report Status PENDING  Incomplete  Culture, blood (routine x 2)     Status: None (Preliminary result)   Collection Time: 07/25/14  5:14 PM  Result Value Ref Range Status   Specimen Description BLOOD RIGHT ANTECUBITAL  Final   Special Requests BOTTLES DRAWN AEROBIC AND ANAEROBIC 5 CC EACH  Final   Culture   Final           BLOOD CULTURE RECEIVED NO GROWTH TO DATE CULTURE WILL BE HELD FOR 5 DAYS BEFORE ISSUING A FINAL NEGATIVE REPORT Performed at [facility]    Report Status PENDING  Incomplete  MRSA PCR Screening     Status: None   Collection Time: 07/25/14  6:33 PM  Result Value Ref Range Status   MRSA by PCR NEGATIVE NEGATIVE Final    Comment:        The GeneXpert MRSA Assay (FDA approved for NASAL specimens only), is one component of a comprehensive MRSA colonization surveillance program. It is not intended to diagnose MRSA infection nor to guide or monitor treatment for MRSA infections.       ANTIBIOTICS: Anti-infectives    Start     Dose/Rate Route Frequency Ordered Stop   07/27/14 1000  doxycycline (VIBRA-TABS) tablet 100 mg  Status:  Discontinued     100 mg Oral Every 12 hours 07/27/14 0933 07/27/14 0947   07/27/14 0900  doxycycline (VIBRAMYCIN) 100 mg in dextrose 5 % 250 mL IVPB  Status:  Discontinued     100 mg125 mL/hr over 120 Minutes Intravenous Every 12 hours 07/27/14 0753 07/27/14 0933   07/26/14 1700  azithromycin (ZITHROMAX) 500 mg in dextrose 5 % 250 mL IVPB  Status:  Discontinued     500 mg250 mL/hr over 60 Minutes Intravenous Every 24 hours 07/25/14 1845 07/25/14 2053   07/25/14 2200  cefTRIAXone (ROCEPHIN) 1 g in dextrose 5 % 50 mL IVPB  Status:  Discontinued     1 g100 mL/hr over 30 Minutes Intravenous Every 24 hours 07/25/14 2053 07/26/14 1008   07/25/14 1715  azithromycin (ZITHROMAX) 500 mg in dextrose 5 % 250 mL IVPB     500 mg250 mL/hr over 60 Minutes Intravenous  Once 07/25/14 1701 07/25/14 1815   07/25/14 1715  cefTRIAXone (ROCEPHIN) 1 g in dextrose 5 % 50 mL IVPB     1 g100 mL/hr over 30 Minutes Intravenous  Once 07/25/14 1701 07/25/14 1750      SIGNIFICANT  EVENTS / STUDIES:  1/09  Admitted, placed on Bipap, CXR with enlarged cardiac silhouette  and ? RLL infiltrate vs atelectasis 1/10  ECHO >> nml LV systolic fxn, dilated hypocontractile RV c/w cor pulmonale 1/11  Tx to med floor.  reports feeling much better, improved swelling, breathing than on admit.      SUBJECTIVE/OVERNIGHT/INTERVAL HX 07/28/14:  Feels similar to v better   Yesterday. On face mask 88% pulse ox. On lasix gtt - 9L: negative. VQ negative for PE.  Primary team increased steroids for continued wheezing but noticed on ace inhibitor  VITAL SIGNS: Temp:  [97.5 F (36.4 C)-98.4 F (36.9 C)] 97.5 F (36.4 C) (01/12 0626) Pulse Rate:  [82-95] 82 (01/12 0626) Resp:  [16-20] 18 (01/12 0626) BP: (102-112)/(61-78) 112/78 mmHg (01/12 0626) SpO2:  [89 %-95 %] 91 % (01/12 0802) FiO2  (%):  [55 %] 55 % (01/12 0802) Weight:  [125 kg (275 lb 9.2 oz)-125.6 kg (276 lb 14.4 oz)] 125 kg (275 lb 9.2 oz) (01/12 0626)  VENTILATOR SETTINGS: Vent Mode:  [-]  FiO2 (%):  [55 %] 55 %  INTAKE / OUTPUT: Intake/Output      01/11 0701 - 01/12 0700 01/12 0701 - 01/13 0700   P.O.  120   I.V. (mL/kg) 47.9 (0.4)    Other 10    Total Intake(mL/kg) 57.9 (0.5) 120 (1)   Urine (mL/kg/hr) 3625 (1.2)    Total Output 3625     Net -3567.1 +120          PHYSICAL EXAMINATION: General:  Obese, on face mask + NAD Neuro:  Alert and Ox3, nonfocal HEENT:  Mm pink/moist, PERRL, short/thick neck Cardiovascular:  Tachy,no m/r/g but distant heart sounds Lungs:  resp's even/non-labored on 5L, lungs bilaterally coarse with rhonchi with exp wheezing Abdomen:  Protuberant, +BS Musculoskeletal:  2+ LE edema  LABS: PULMONARY  Recent Labs Lab 07/25/14 1800 07/25/14 1944 07/26/14 0834 07/27/14 1741  PHART 7.268* 7.258* 7.330* 7.338*  PCO2ART 72.4* 75.9* 68.8* 79.6*  PO2ART 55.4* 63.9* 62.8* 61.6*  HCO3 32.0* 32.9* 35.3* 41.6*  TCO2 28.7 29.5 31.4 36.9  O2SAT 85.0 89.5 90.7 89.4    CBC  Recent Labs Lab 07/25/14 1534 07/26/14 0425 07/28/14 0504  HGB 14.7 15.1* 14.1  HCT 52.6* 52.6* 50.4*  WBC 7.5 6.9 7.3  PLT 208 164 177    COAGULATION No results for input(s): INR in the last 168 hours.  CARDIAC   Recent Labs Lab 07/25/14 1534 07/25/14 2130 07/26/14 0425  TROPONINI <0.03 <0.03 <0.03   No results for input(s): PROBNP in the last 168 hours.   CHEMISTRY  Recent Labs Lab 07/25/14 1534 07/25/14 1801 07/26/14 0425 07/27/14 0330 07/28/14 0504  NA 135 134* 136 138 141  K 5.3* 4.7 4.8 4.0 3.5  CL 97 94* 93* 94* 89*  CO2 34* 30 35* 32 44*  GLUCOSE 84 100* 120* 53* 111*  BUN 16 15 13 13 13   CREATININE 0.77 0.74 0.76 0.70 0.63  CALCIUM 8.2* 8.2* 8.1* 8.2* 8.5   Estimated Creatinine Clearance: 102.6 mL/min (by C-G formula based on Cr of 0.63).   LIVER  Recent  Labs Lab 07/25/14 1534 07/25/14 1801 07/26/14 0425  AST 36 33 31  ALT 30 31 28   ALKPHOS 85 80 72  BILITOT 0.6 0.4 0.4  PROT 7.1 6.9 6.6  ALBUMIN 3.0* 2.9* 2.8*     INFECTIOUS  Recent Labs Lab 07/25/14 1801 07/27/14 0330  LATICACIDVEN 1.4  --   PROCALCITON <  0.10 <0.10     ENDOCRINE CBG (last 3)  No results for input(s): GLUCAP in the last 72 hours.       IMAGING x48h Nm Pulmonary Perf And Vent  07/27/2014   CLINICAL DATA:  Shortness of breath, pulmonary hypertension.  EXAM: NUCLEAR MEDICINE VENTILATION - PERFUSION LUNG SCAN  TECHNIQUE: Ventilation images were obtained in multiple projections using inhaled aerosol technetium 99 M DTPA. Perfusion images were obtained in multiple projections after intravenous injection of Tc-57m MAA.  RADIOPHARMACEUTICALS:  5.4 mCi Tc-86m DTPA aerosol and 43.7 mCi Tc-9m MAA  COMPARISON:  Chest radiograph of July 25, 2014.  FINDINGS: Ventilation: Multiple defects are noted, but the largest appears to be in the right lower lobe.  Perfusion: Predominantly linear defect is seen involving the right lung which most likely corresponds to fluid in the right major fissure. No other significant defects are noted. The chest radiograph does demonstrate opacity in the right lower lobe which potentially may involve fluid in the minor and major fissures.  IMPRESSION: Findings most consistent with low probability of pulmonary embolus.   Electronically Signed   By: Sabino Dick M.D.   On: 07/27/2014 13:07        ASSESSMENT / PLAN:  PULMONARY A: Acute Hypoxic, hypercarbic Resp Failure - secondary to CHF and volume overload.  Suspect R heart failure and secondary PAH are largest issue here ?? RML CAP vs atx and collapse; clinical course inconsistent w infection Mild COPD - reportedly has had PFT and was told "her lungs were fine".  I dont have access to PFT at this time but suspect that she has mixed disease.  Tobacco Abuse - ongoing, hx up to 3 ppd,  currently 1/2 ppd   - 07/28/2014 - though better still ongoing wheeze  P:   CHANGE LISINOPRIL to LOSARTAN due to wheeze Lasix gtt per cards Strict I's and O's with foley in place Solu-medrol  Increased by [facility] ; will reassess 07/29/14 Continue Albuterol, Singular, Spiriva, Chantix  BiPAP PRN  Albuterol q6 nebs and duonebs prn q4 Follow Sputum cx Ambulatory assessment of O2 needs prior to discharge Will need outpatient sleep study  Smoking cessation  Monitor off abx   CARDIOVASCULAR A: Probable PAH with cor pulmonale Possible new dx CHF, unclear diastolic or systolic or both at this time P:   Appreciate Cardiology input Aggressive diuresis as BP / Sr CR tolerate  Will need to manage PAH underlying issues, especially OHS Monitor lytes  Tele  RENAL A: No acute issues P:   Monitor I's and O's Trend BMP with lasix gtt  GASTROINTESTINAL A: Morbid Obesity  P:   Fluid restricted diet  HEMATOLOGIC A: No acute issues P:   Monitor CBC   INFECTIOUS A: Possible RML CAP. Off abx. PCT negative P:   follow clinically.     NEUROLOGIC A: Bipolar Disorder P:   Per Primary [facility]    Dr. Brand Males, M.D., [facility].C.P Pulmonary and Critical Care Medicine Staff Physician [place] [facility] Pager: [phone], If no answer or between  15:00h - 7:00h: call [phone]  07/28/2014 10:41 AM

## 2014-07-29 LAB — BASIC METABOLIC PANEL
Anion gap: 10 (ref 5–15)
BUN: 14 mg/dL (ref 6–23)
CO2: 46 mmol/L (ref 19–32)
Calcium: 8.5 mg/dL (ref 8.4–10.5)
Chloride: 85 mEq/L — ABNORMAL LOW (ref 96–112)
Creatinine, Ser: 0.65 mg/dL (ref 0.50–1.10)
GFR calc Af Amer: >90 mL/min (ref >90)
GFR calc non Af Amer: >90 mL/min (ref >90)
Glucose, Bld: 125 mg/dL — ABNORMAL HIGH (ref 70–99)
Potassium: 3.6 mmol/L (ref 3.5–5.1)
Sodium: 141 mmol/L (ref 135–145)

## 2014-07-29 LAB — EXPECTORATED SPUTUM ASSESSMENT W REFEX TO RESP CULTURE: Special Requests: NORMAL

## 2014-07-29 LAB — PROCALCITONIN: Procalcitonin: <0.1 ng/mL

## 2014-07-29 LAB — EXPECTORATED SPUTUM ASSESSMENT W GRAM STAIN, RFLX TO RESP C

## 2014-07-29 MED ORDER — IPRATROPIUM-ALBUTEROL 0.5-2.5 (3) MG/3ML IN SOLN
3.0000 mL | RESPIRATORY_TRACT | Status: DC
  Administered 2014-07-29 – 2014-08-03 (×31): 3 mL via RESPIRATORY_TRACT
  Filled 2014-07-29 (×31): qty 3

## 2014-07-29 MED ORDER — FUROSEMIDE 40 MG PO TABS
80.0000 mg | ORAL_TABLET | Freq: Every day | ORAL | Status: DC
  Administered 2014-07-29 – 2014-07-30 (×2): 80 mg via ORAL
  Filled 2014-07-29 (×2): qty 2

## 2014-07-29 NOTE — Progress Notes (Addendum)
[facility] PROGRESS NOTE  Deborah [name]:[id number] DOB: February 12, 1956 DOA: 07/25/2014 PCP: No primary care provider on file.  Summary/Subjective I have seen and examined Mrs Deborah [name] at bedside and reviewed her chart. I also discussed with Dr Chase Caller. Appreciate cardiology/pulmonary help. Deborah [name] is a pleasant 59 y/o female smoker with COPD, essential Htn, Morbid obesity, Body mass index is 45.12 kg/(m^2)., bipolar disorder, who came to the emergency room with complaints of feeling poorly for the past 6 months followed by worsening short of breath on exertion as well as increasing lower extremity edema. She is being treated for "hypercarbic, hypoxic resp failure likely due to volume overload from previously undx CHF". Her CXR showed "1. Cardiomegaly. 2. Bibasilar consolidation, atelectasis versus pneumonitis. 3. Moderate left pleural effusion.". 2D Echo showed EF 50-55% with grade 1 diastolic dysfunction with suggestion of Corpulmonale. She had cardiac cath which showed?. She is to get Ct chest today. She is on lasix drip. She reports that SOB is better. She ambulated in the room with minimal dyspnea. Leg swelling is also better. Will transition lasix to oral per cardiology recommendation and monitor electrolytes. Acute respiratory failure with hypoxia and hypercarbia due to Acute diastolic HF/COPD exacerbation: - Continue management per pulmonary/cardiology. - Change Lasix to oral. - For CT chest - Monitor electrolytes.  Essential Hypertension - Her blood pressure has improved with diuresis.  - start beta blocker.  Hyperkalemia - Resolved.  CRLD (chronic restrictive lung disease) secondary to super morbid obesity with mild COPD component - Continue current home medications.  Tobacco abuse disorder: - Counseling.  Bipolar 1 disorder: - On no medications at home.  Code Status: Full Code. Family Communication: Called husband Deborah [name] at [phone], and left  message. Disposition Plan: Eventually home.   Consultants:  Pulmonary/Cardiology.  Procedures:  Cardiac Cath  Antibiotics:  None at present.   Objective: Filed Vitals:   07/29/14 1455  BP: 112/91  Pulse: 87  Temp: 98 F (36.7 C)  Resp: 19    Intake/Output Summary (Last 24 hours) at 07/29/14 1559 Last data filed at 07/29/14 1456  Gross per 24 hour  Intake    544 ml  Output   2650 ml  Net  -2106 ml   Filed Weights   07/27/14 1500 07/28/14 0626 07/29/14 0641  Weight: 125.6 kg (276 lb 14.4 oz) 125 kg (275 lb 9.2 oz) 116.03 kg (255 lb 12.8 oz)    Exam:   General:  Sitting up in bed, in mild respiratory distress.  Cardiovascular: RRR. S1S2 heard. No murmurs.  Respiratory: Good air entry bilaterally. No rhonchi or rales.  Abdomen: soft, non tender. +BS.  Musculoskeletal: Some pedal edema.  Data Reviewed: Basic Metabolic Panel:  Recent Labs Lab 07/25/14 1801 07/26/14 0425 07/27/14 0330 07/28/14 0504 07/29/14 0357  NA 134* 136 138 141 141  K 4.7 4.8 4.0 3.5 3.6  CL 94* 93* 94* 89* 85*  CO2 30 35* 32 44* 46*  GLUCOSE 100* 120* 53* 111* 125*  BUN 15 13 13 13 14   CREATININE 0.74 0.76 0.70 0.63 0.65  CALCIUM 8.2* 8.1* 8.2* 8.5 8.5   Liver Function Tests:  Recent Labs Lab 07/25/14 1534 07/25/14 1801 07/26/14 0425  AST 36 33 31  ALT 30 31 28   ALKPHOS 85 80 72  BILITOT 0.6 0.4 0.4  PROT 7.1 6.9 6.6  ALBUMIN 3.0* 2.9* 2.8*   No results for input(s): LIPASE, AMYLASE in the last 168 hours. No results for input(s): AMMONIA in the last 168 hours.  CBC:  Recent Labs Lab 07/25/14 1534 07/26/14 0425 07/28/14 0504  WBC 7.5 6.9 7.3  NEUTROABS 5.1  --   --   HGB 14.7 15.1* 14.1  HCT 52.6* 52.6* 50.4*  MCV 88.0 88.6 88.3  PLT 208 164 177   Cardiac Enzymes:  Recent Labs Lab 07/25/14 1534 07/25/14 2130 07/26/14 0425  TROPONINI <0.03 <0.03 <0.03   BNP (last 3 results) No results for input(s): PROBNP in the last 8760 hours. CBG: No  results for input(s): GLUCAP in the last 168 hours.  Recent Results (from the past 240 hour(s))  Culture, blood (routine x 2)     Status: None (Preliminary result)   Collection Time: 07/25/14  5:14 PM  Result Value Ref Range Status   Specimen Description BLOOD LEFT ANTECUBITAL  Final   Special Requests BOTTLES DRAWN AEROBIC AND ANAEROBIC 5 CC EACH  Final   Culture   Final           BLOOD CULTURE RECEIVED NO GROWTH TO DATE CULTURE WILL BE HELD FOR 5 DAYS BEFORE ISSUING A FINAL NEGATIVE REPORT Performed at [facility]    Report Status PENDING  Incomplete  Culture, blood (routine x 2)     Status: None (Preliminary result)   Collection Time: 07/25/14  5:14 PM  Result Value Ref Range Status   Specimen Description BLOOD RIGHT ANTECUBITAL  Final   Special Requests BOTTLES DRAWN AEROBIC AND ANAEROBIC 5 CC EACH  Final   Culture   Final           BLOOD CULTURE RECEIVED NO GROWTH TO DATE CULTURE WILL BE HELD FOR 5 DAYS BEFORE ISSUING A FINAL NEGATIVE REPORT Performed at [facility]    Report Status PENDING  Incomplete  MRSA PCR Screening     Status: None   Collection Time: 07/25/14  6:33 PM  Result Value Ref Range Status   MRSA by PCR NEGATIVE NEGATIVE Final    Comment:        The GeneXpert MRSA Assay (FDA approved for NASAL specimens only), is one component of a comprehensive MRSA colonization surveillance program. It is not intended to diagnose MRSA infection nor to guide or monitor treatment for MRSA infections.   Culture, expectorated sputum-assessment     Status: None   Collection Time: 07/29/14  1:52 PM  Result Value Ref Range Status   Specimen Description SPUTUM  Final   Special Requests Normal  Final   Sputum evaluation   Final    THIS SPECIMEN IS ACCEPTABLE. RESPIRATORY CULTURE REPORT TO FOLLOW.   Report Status 07/29/2014 FINAL  Final     Studies: Dg Chest 2 View  07/28/2014   CLINICAL DATA:  59 year old with shortness of breath and weakness  EXAM:  CHEST  2 VIEW  COMPARISON:  07/25/2014  FINDINGS: The cardiac silhouette is markedly enlarged. The mediastinal contours are unchanged.  Bibasilar lung base consolidation is noted. The left costophrenic angle is blunted, compatible with a moderate-sized pleural effusion on lateral radiograph. There is no overt pulmonary edema. There is no pneumothorax.  The osseous structures are unchanged.  IMPRESSION: 1. Cardiomegaly. 2. Bibasilar consolidation, atelectasis versus pneumonitis. 3. Moderate left pleural effusion.   Electronically Signed   By: Rosemarie Ax   On: 07/28/2014 13:01    Scheduled Meds: . antiseptic oral rinse  7 mL Mouth Rinse q12n4p  . aspirin  81 mg Oral Daily  . chlorhexidine  15 mL Mouth Rinse BID  . heparin  5,000 Units Subcutaneous  3 times per day  . ipratropium-albuterol  3 mL Nebulization Q4H  . losartan  50 mg Oral Daily  . methylPREDNISolone (SOLU-MEDROL) injection  125 mg Intravenous 3 times per day  . metoprolol tartrate  12.5 mg Oral BID  . montelukast  10 mg Oral QHS  . nicotine  14 mg Transdermal Daily  . sodium chloride  3 mL Intravenous Q12H  . varenicline  1 mg Oral Daily   Continuous Infusions: . furosemide (LASIX) infusion 4 mg/hr (07/29/14 0130)    Principal Problem:   Acute respiratory failure with hypoxia and hypercarbia Active Problems:   Shortness of breath   CHF, acute on chronic, unknown EF   Hypertension   Hyperkalemia   CRLD (chronic restrictive lung disease) secondary to super morbid obesity with mild COPD component   Tobacco abuse disorder   Bipolar 1 disorder   Acute respiratory acidosis   Pulmonary HTN   Acute diastolic heart failure   COPD exacerbation    Time spent: 25 minutes.    [facility]  [facility] Pager [phone]. If 7PM-7AM, please contact night-coverage at [email or web address], password [facility] 07/29/2014, 3:59 PM  LOS: 4 days

## 2014-07-29 NOTE — Progress Notes (Signed)
Pt placed on BiPAP QHS.  Settings are automode with minimum EPAP of 5 cm H2O, maximum IPAP of 20 cm H2O, and minimum Pressure Support of 4 cm H2O.  Pt stable and comfortable.  HR-81; SpO2 - 93% and RR- 18.

## 2014-07-29 NOTE — Progress Notes (Signed)
PULMONARY  / [place]   Name: Deborah [name] MRN: [id number] DOB: 1955/11/28    ADMISSION DATE:  07/25/2014 CONSULTATION DATE: 07/25/14  REQUESTING CLINICIAN: Dr. Verlon Au PRIMARY SERVICE: [facility]  CHIEF COMPLAINT:  SOB, edema  BRIEF PATIENT DESCRIPTION: 4 yof with PMH of morbid obesity, mild COPD, OSA presents with hypercarbic, hypoxic resp failure likely due to volume overload from previously undx CHF.   CULTURES: Results for orders placed or performed during the hospital encounter of 07/25/14  Culture, blood (routine x 2)     Status: None (Preliminary result)   Collection Time: 07/25/14  5:14 PM  Result Value Ref Range Status   Specimen Description BLOOD LEFT ANTECUBITAL  Final   Special Requests BOTTLES DRAWN AEROBIC AND ANAEROBIC 5 CC EACH  Final   Culture   Final           BLOOD CULTURE RECEIVED NO GROWTH TO DATE CULTURE WILL BE HELD FOR 5 DAYS BEFORE ISSUING A FINAL NEGATIVE REPORT Performed at [facility]    Report Status PENDING  Incomplete  Culture, blood (routine x 2)     Status: None (Preliminary result)   Collection Time: 07/25/14  5:14 PM  Result Value Ref Range Status   Specimen Description BLOOD RIGHT ANTECUBITAL  Final   Special Requests BOTTLES DRAWN AEROBIC AND ANAEROBIC 5 CC EACH  Final   Culture   Final           BLOOD CULTURE RECEIVED NO GROWTH TO DATE CULTURE WILL BE HELD FOR 5 DAYS BEFORE ISSUING A FINAL NEGATIVE REPORT Performed at [facility]    Report Status PENDING  Incomplete  MRSA PCR Screening     Status: None   Collection Time: 07/25/14  6:33 PM  Result Value Ref Range Status   MRSA by PCR NEGATIVE NEGATIVE Final    Comment:        The GeneXpert MRSA Assay (FDA approved for NASAL specimens only), is one component of a comprehensive MRSA colonization surveillance program. It is not intended to diagnose MRSA infection nor to guide or monitor treatment for MRSA infections.       ANTIBIOTICS: Anti-infectives    Start     Dose/Rate Route Frequency Ordered Stop   07/27/14 1000  doxycycline (VIBRA-TABS) tablet 100 mg  Status:  Discontinued     100 mg Oral Every 12 hours 07/27/14 0933 07/27/14 0947   07/27/14 0900  doxycycline (VIBRAMYCIN) 100 mg in dextrose 5 % 250 mL IVPB  Status:  Discontinued     100 mg125 mL/hr over 120 Minutes Intravenous Every 12 hours 07/27/14 0753 07/27/14 0933   07/26/14 1700  azithromycin (ZITHROMAX) 500 mg in dextrose 5 % 250 mL IVPB  Status:  Discontinued     500 mg250 mL/hr over 60 Minutes Intravenous Every 24 hours 07/25/14 1845 07/25/14 2053   07/25/14 2200  cefTRIAXone (ROCEPHIN) 1 g in dextrose 5 % 50 mL IVPB  Status:  Discontinued     1 g100 mL/hr over 30 Minutes Intravenous Every 24 hours 07/25/14 2053 07/26/14 1008   07/25/14 1715  azithromycin (ZITHROMAX) 500 mg in dextrose 5 % 250 mL IVPB     500 mg250 mL/hr over 60 Minutes Intravenous  Once 07/25/14 1701 07/25/14 1815   07/25/14 1715  cefTRIAXone (ROCEPHIN) 1 g in dextrose 5 % 50 mL IVPB     1 g100 mL/hr over 30 Minutes Intravenous  Once 07/25/14 1701 07/25/14 1750      SIGNIFICANT  EVENTS / STUDIES:  1/09  Admitted, placed on Bipap, CXR with enlarged cardiac silhouette  and ? RLL infiltrate vs atelectasis 1/10  ECHO >> nml LV systolic fxn, dilated hypocontractile RV c/w cor pulmonale 1/11  Tx to med floor.  reports feeling much better, improved swelling, breathing than on admit.    07/28/14:  Feels similar to v better   Yesterday. On face mask 88% pulse ox. On lasix gtt - 9L: negative. VQ negative for PE.  Primary team increased steroids for continued wheezing but noticed on ace inhibitor  SUBJECTIVE/OVERNIGHT/INTERVAL HX 07/29/14:  ACE inhibitor changed to ARB and steriouds increased yesterday and now wheezing better. But still on 50% FM. Husband at bedside  -both frustrated due to lack of info on medical course.   VITAL SIGNS: Temp:  [97.8 F (36.6 C)-97.9 F (36.6  C)] 97.8 F (36.6 C) (01/13 0617) Pulse Rate:  [73-95] 73 (01/13 0617) Resp:  [18-22] 18 (01/13 0617) BP: (99-130)/(54-71) 130/71 mmHg (01/13 0617) SpO2:  [90 %-95 %] 92 % (01/13 0846) FiO2 (%):  [55 %] 55 % (01/13 0846) Weight:  [116.03 kg (255 lb 12.8 oz)] 116.03 kg (255 lb 12.8 oz) (01/13 0641)  VENTILATOR SETTINGS: Vent Mode:  [-]  FiO2 (%):  [55 %] 55 %  INTAKE / OUTPUT: Intake/Output      01/12 0701 - 01/13 0700 01/13 0701 - 01/14 0700   P.O. 360    I.V. (mL/kg) 99 (0.9)    Other     Total Intake(mL/kg) 459 (4)    Urine (mL/kg/hr) 2050 (0.7)    Total Output 2050     Net -1591            PHYSICAL EXAMINATION: General:  Obese, on face mask + NAD Neuro:  Alert and Ox3, nonfocal HEENT:  Mm pink/moist, PERRL, short/thick neck Cardiovascular:  Tachy,no m/r/g but distant heart sounds Lungs:  resp's even/non-labored on 50% FM , Improved wheezing by > 70% Abdomen:  Protuberant, +BS Musculoskeletal:  2+ LE edema  LABS: PULMONARY  Recent Labs Lab 07/25/14 1800 07/25/14 1944 07/26/14 0834 07/27/14 1741  PHART 7.268* 7.258* 7.330* 7.338*  PCO2ART 72.4* 75.9* 68.8* 79.6*  PO2ART 55.4* 63.9* 62.8* 61.6*  HCO3 32.0* 32.9* 35.3* 41.6*  TCO2 28.7 29.5 31.4 36.9  O2SAT 85.0 89.5 90.7 89.4    CBC  Recent Labs Lab 07/25/14 1534 07/26/14 0425 07/28/14 0504  HGB 14.7 15.1* 14.1  HCT 52.6* 52.6* 50.4*  WBC 7.5 6.9 7.3  PLT 208 164 177    COAGULATION No results for input(s): INR in the last 168 hours.  CARDIAC    Recent Labs Lab 07/25/14 1534 07/25/14 2130 07/26/14 0425  TROPONINI <0.03 <0.03 <0.03   No results for input(s): PROBNP in the last 168 hours.   CHEMISTRY  Recent Labs Lab 07/25/14 1801 07/26/14 0425 07/27/14 0330 07/28/14 0504 07/29/14 0357  NA 134* 136 138 141 141  K 4.7 4.8 4.0 3.5 3.6  CL 94* 93* 94* 89* 85*  CO2 30 35* 32 44* 46*  GLUCOSE 100* 120* 53* 111* 125*  BUN 15 13 13 13 14   CREATININE 0.74 0.76 0.70 0.63 0.65   CALCIUM 8.2* 8.1* 8.2* 8.5 8.5   Estimated Creatinine Clearance: 98.2 mL/min (by C-G formula based on Cr of 0.65).   LIVER  Recent Labs Lab 07/25/14 1534 07/25/14 1801 07/26/14 0425  AST 36 33 31  ALT 30 31 28   ALKPHOS 85 80 72  BILITOT 0.6 0.4 0.4  PROT  7.1 6.9 6.6  ALBUMIN 3.0* 2.9* 2.8*     INFECTIOUS  Recent Labs Lab 07/25/14 1801 07/27/14 0330 07/29/14 0357  LATICACIDVEN 1.4  --   --   PROCALCITON <0.10 <0.10 <0.10     ENDOCRINE CBG (last 3)  No results for input(s): GLUCAP in the last 72 hours.       IMAGING x48h Dg Chest 2 View  07/28/2014   CLINICAL DATA:  59 year old with shortness of breath and weakness  EXAM: CHEST  2 VIEW  COMPARISON:  07/25/2014  FINDINGS: The cardiac silhouette is markedly enlarged. The mediastinal contours are unchanged.  Bibasilar lung base consolidation is noted. The left costophrenic angle is blunted, compatible with a moderate-sized pleural effusion on lateral radiograph. There is no overt pulmonary edema. There is no pneumothorax.  The osseous structures are unchanged.  IMPRESSION: 1. Cardiomegaly. 2. Bibasilar consolidation, atelectasis versus pneumonitis. 3. Moderate left pleural effusion.   Electronically Signed   By: Rosemarie Ax   On: 07/28/2014 13:01   Nm Pulmonary Perf And Vent  07/27/2014   CLINICAL DATA:  Shortness of breath, pulmonary hypertension.  EXAM: NUCLEAR MEDICINE VENTILATION - PERFUSION LUNG SCAN  TECHNIQUE: Ventilation images were obtained in multiple projections using inhaled aerosol technetium 99 M DTPA. Perfusion images were obtained in multiple projections after intravenous injection of Tc-5m MAA.  RADIOPHARMACEUTICALS:  5.4 mCi Tc-57m DTPA aerosol and 43.7 mCi Tc-31m MAA  COMPARISON:  Chest radiograph of July 25, 2014.  FINDINGS: Ventilation: Multiple defects are noted, but the largest appears to be in the right lower lobe.  Perfusion: Predominantly linear defect is seen involving the right lung which  most likely corresponds to fluid in the right major fissure. No other significant defects are noted. The chest radiograph does demonstrate opacity in the right lower lobe which potentially may involve fluid in the minor and major fissures.  IMPRESSION: Findings most consistent with low probability of pulmonary embolus.   Electronically Signed   By: Sabino Dick M.D.   On: 07/27/2014 13:07        ASSESSMENT / PLAN:  PULMONARY A: Acute Hypoxic, hypercarbic Resp Failure - secondary to CHF and volume overload.  Suspect R heart failure and secondary PAH are largest issue here ?? RML CAP vs atx and collapse; clinical course inconsistent w infection Mild COPD - reportedly has had PFT and was told "her lungs were fine".  I dont have access to PFT at this time but suspect that she has mixed disease.  Tobacco Abuse - ongoing, hx up to 3 ppd, currently 1/2 ppd   - 07/29/2014 - though better still very hypoxemic. Suspect due to severe cor pulmonale from OSA, and atelectasis from obesity and diastolic dysfn   P:   Get Chest CT No ACE inhibitor due to wheeze  Lasix gtt per cards and primary team ? Change to intermittent 07/29/2014 - triad to edece Solu-medrol  Increased by triad ; will reassess 07/30/14  DC spiriva, start scheduled duoneb q4h Continue Albuterol, Singular,  Chantix   Amulatory assessment of O2 needs prior to discharge Will need outpatient sleep study  Smoking cessation  Monitor off abx   CARDIOVASCULAR A: Probable PAH with cor pulmonale Possible new dx CHF, unclear diastolic or systolic or both at this time   P:   Per cards  RENAL A: No acute issues P:   Monitor I's and O's Trend BMP with lasix gtt  GASTROINTESTINAL A: Morbid Obesity  P:   Fluid restricted diet  HEMATOLOGIC A: No acute issues P:   Monitor CBC   INFECTIOUS A: Possible RML CAP. Off abx. PCT negative P:   follow clinically.     NEUROLOGIC A: Bipolar Disorder P:   Per Primary  SVC   GLOBAL 07/29/2014 : husband and wife/patient  updated. Husband will be in AM at bedside - so update during rounds by PCCM/TRH otherwise on phone by [facility]. PCCM will re round in 2 days. GEt CT today   Dr. Brand Males, M.D., [facility].C.P Pulmonary and Critical Care Medicine Staff Physician [place] [facility] Pager: [phone], If no answer or between  15:00h - 7:00h: call [phone]  07/29/2014 10:43 AM

## 2014-07-29 NOTE — Evaluation (Signed)
Physical Therapy Evaluation Patient Details Name: Deborah [name] MRN: [id number] DOB: [birth year] Today's Date: 07/29/2014   History of Present Illness  59 yo female admitted with acute respiratory failure. Hx of COPD, morbid obesity, bipolar d/o, HTN  Clinical Impression  On eval, pt required Min assist for mobility-able to ambulate ~60 feet x 2 while holding onto IV pole and handrail. Pt tolerated activity well-remained on VM O2-sats 88-89% with activity, 90-91% at rest.     Follow Up Recommendations Home health PT    Equipment Recommendations       Recommendations for Other Services OT consult     Precautions / Restrictions Precautions Precautions: Fall Precaution Comments: monitor O2 sats/high O2 requirement Restrictions Weight Bearing Restrictions: No      Mobility  Bed Mobility               General bed mobility comments: pt OOB in recliner  Transfers Overall transfer level: Needs assistance   Transfers: Sit to/from Stand Sit to Stand: Min guard         General transfer comment: close guard for safety  Ambulation/Gait Ambulation/Gait assistance: Min assist Ambulation Distance (Feet): 60 Feet (x2) Assistive device:  (IV pole +handrail) Gait Pattern/deviations: Wide base of support;Step-through pattern     General Gait Details: slightly unsteady so pt preferred to hold onto hallway handrail/furniture walk (in additon to holding onto IV pole)-she declined RW use. Followed closely with recliner. Seated rest break taken before 2nd walk back to room  Stairs            Wheelchair Mobility    Modified Rankin (Stroke Patients Only)       Balance                                             Pertinent Vitals/Pain Pain Assessment: No/denies pain    Home Living Family/patient expects to be discharged to:: Private residence Living Arrangements: Spouse/significant other (daughter; both work)   Type of Home: Apartment Home  Access: Stairs to enter Entrance Stairs-Rails: Right Entrance Stairs-Number of Steps: Walls: One level Home Equipment: Grab bars - tub/shower      Prior Function Level of Independence: Needs assistance   Gait / Transfers Assistance Needed: household ambulation without device  ADL's / Homemaking Assistance Needed: assist into tub/shower-sits on stool-assist to bathe        Hand Dominance        Extremity/Trunk Assessment   Upper Extremity Assessment: Overall WFL for tasks assessed           Lower Extremity Assessment: Generalized weakness      Cervical / Trunk Assessment: Normal  Communication   Communication: No difficulties  Cognition Arousal/Alertness: Awake/alert Behavior During Therapy: WFL for tasks assessed/performed Overall Cognitive Status: Within Functional Limits for tasks assessed                      General Comments      Exercises        Assessment/Plan    PT Assessment Patient needs continued PT services  PT Diagnosis Difficulty walking;Generalized weakness   PT Problem List Decreased mobility;Decreased activity tolerance;Cardiopulmonary status limiting activity  PT Treatment Interventions DME instruction;Gait training;Functional mobility training;Therapeutic activities;Balance training;Patient/family education   PT Goals (Current goals can be found in the Care Plan section) Acute Rehab PT Goals Patient Stated  Goal: to get stronger PT Goal Formulation: With patient Time For Goal Achievement: 08/12/14 Potential to Achieve Goals: Good    Frequency Min 3X/week   Barriers to discharge        Co-evaluation               End of Session   Activity Tolerance: Patient tolerated treatment well Patient left: in chair;with call bell/phone within reach           Time: 1130-1151 PT Time Calculation (min) (ACUTE ONLY): 21 min   Charges:   PT Evaluation $Initial PT Evaluation Tier I: 1 Procedure PT  Treatments $Gait Training: 8-22 mins   PT G Codes:        Weston Anna, MPT Pager: [phone]

## 2014-07-29 NOTE — Progress Notes (Signed)
   07/29/14 1049  Clinical Encounter Type  Visited With Patient and family together;Health care provider  Visit Type Spiritual support  Referral From Nurse  Consult/Referral To Chaplain  Spiritual Encounters  Spiritual Needs Emotional  Stress Factors  Patient Stress Factors Lack of knowledge;Health changes  Family Stress Factors Lack of knowledge;Health changes    07/29/14 1049  Clinical Encounter Type  Visited With Patient and family together  Visit Type Spiritual support  Referral From Nurse  Consult/Referral To Chaplain  Spiritual Encounters  Spiritual Needs Emotional  Stress Factors  Patient Stress Factors Lack of knowledge;Health changes  Family Stress Factors Lack of knowledge;Health changes  Chaplain responded to spiritual care consult. Pt husband at bedside. It became apparent soon in the visit that pt and husband were experiencing confusion and frustration regarding the pt's condition and treatment plan and feeling "in the dark." Chaplain provided empathic listening, encouraged them to voice their concerns with medical staff, and offered to provide pt advocacy by getting nurse and dr to speak with them; at that moment nurse came in, followed shortly by pulmonary Dr. Bonney Roussel present while pt and spouse were updated and asked their questions. Dr said he would communicate with pt's regular dr about checking in regularly with pt husband to keep them up to date. After medical staff left husband shared he felt much better and much more informed. Chaplain then provided additional emotional support for pt. Chaplain will communicate with pastoral care staff for follow-up.   Vanetta Mulders 07/29/2014 10:57 AM

## 2014-07-29 NOTE — Progress Notes (Signed)
CRITICAL VALUE ALERT  Critical value received:  CO2 46  Date of notification:  07/29/14  Time of notification:  05:49  Critical value read back:Yes.    Nurse who received alert:  Ruben Im, RN  MD notified (1st page):  Jennet Maduro, NP  Time of first page:  05:50  MD notified (2nd page):  Time of second page:  Responding MD:  Notified NP via text page, no call back. Will continue to monitor pt.   Time MD responded:

## 2014-07-29 NOTE — Evaluation (Signed)
Occupational Therapy Evaluation Patient Details Name: Deborah [name] MRN: [id number] DOB: 1955-07-19 Today's Date: 07/29/2014    History of Present Illness 59 yo female admitted with acute respiratory failure. Hx of COPD, morbid obesity, bipolar d/o, HTN   Clinical Impression   Pt admitted with . Pt currently with functional limitations due to the deficits listed below (see OT Problem List).  Pt will benefit from skilled OT to increase their safety and independence with ADL and functional mobility for ADL to facilitate discharge to venue listed below.     Follow Up Recommendations  Home health OT    Equipment Recommendations  Other (comment) (TBD)       Precautions / Restrictions Precautions Precautions: Fall Precaution Comments: monitor O2 sats/high O2 requirement Restrictions Weight Bearing Restrictions: No      Mobility Bed Mobility               General bed mobility comments: pt OOB in recliner  Transfers Overall transfer level: Needs assistance   Transfers: Sit to/from Stand Sit to Stand: Min guard         General transfer comment: VC for safety         ADL Overall ADL's : Needs assistance/impaired Eating/Feeding: Set up;Sitting   Grooming: Set up;Sitting   Upper Body Bathing: Minimal assitance;Sitting   Lower Body Bathing: Sit to/from stand   Upper Body Dressing : Minimal assistance;Sitting   Lower Body Dressing: Sit to/from stand;Maximal assistance                       Vision                            Pertinent Vitals/Pain Pain Assessment: No/denies pain     Hand Dominance     Extremity/Trunk Assessment Upper Extremity Assessment Upper Extremity Assessment: Generalized weakness   Lower Extremity Assessment Lower Extremity Assessment: Generalized weakness   Cervical / Trunk Assessment Cervical / Trunk Assessment: Normal   Communication Communication Communication: No difficulties   Cognition  Arousal/Alertness: Awake/alert Behavior During Therapy: WFL for tasks assessed/performed Overall Cognitive Status: Within Functional Limits for tasks assessed                     General Comments   needs Ae and Energy conservation        Shoulder Instructions      Home Living Family/patient expects to be discharged to:: Private residence Living Arrangements: Spouse/significant other (daughter; both work)   Type of Home: Apartment Home Access: Stairs to enter Technical brewer of Steps: 15 Entrance Stairs-Rails: Right Home Layout: One level     Bathroom Shower/Tub: Tub/shower unit Shower/tub characteristics: Door       Home Equipment: Grab bars - tub/shower          Prior Functioning/Environment Level of Independence: Needs assistance  Gait / Transfers Assistance Needed: household ambulation without device ADL's / Homemaking Assistance Needed: assist into tub/shower-sits on stool-assist to bathe        OT Diagnosis: Generalized weakness   OT Problem List: Decreased strength;Decreased activity tolerance;Cardiopulmonary status limiting activity   OT Treatment/Interventions: Self-care/ADL training;Patient/family education;DME and/or AE instruction    OT Goals(Current goals can be found in the care plan section) Acute Rehab OT Goals Patient Stated Goal: to get stronger OT Goal Formulation: With patient Time For Goal Achievement: 08/12/14 Potential to Achieve Goals: Good  OT Frequency: Min 2X/week  End of Session Nurse Communication: Mobility status  Activity Tolerance: Patient limited by fatigue Patient left: in chair   Time: 1300-1315 OT Time Calculation (min): 15 min Charges:  OT General Charges $OT Visit: 1 Procedure OT Evaluation $Initial OT Evaluation Tier I: 1 Procedure $OT Re-eval: 1 Procedure G-Codes:    Betsy Pries August 16, 2014, 1:45 PM

## 2014-07-30 ENCOUNTER — Inpatient Hospital Stay (HOSPITAL_COMMUNITY): Payer: 59

## 2014-07-30 LAB — COMPREHENSIVE METABOLIC PANEL
ALT: 36 U/L — ABNORMAL HIGH (ref 0–35)
AST: 47 U/L — ABNORMAL HIGH (ref 0–37)
Albumin: 3.2 g/dL — ABNORMAL LOW (ref 3.5–5.2)
Alkaline Phosphatase: 55 U/L (ref 39–117)
Anion gap: 11 (ref 5–15)
BUN: 12 mg/dL (ref 6–23)
CO2: 48 mmol/L (ref 19–32)
Calcium: 8.6 mg/dL (ref 8.4–10.5)
Chloride: 80 mEq/L — ABNORMAL LOW (ref 96–112)
Creatinine, Ser: 0.62 mg/dL (ref 0.50–1.10)
GFR calc Af Amer: >90 mL/min (ref >90)
GFR calc non Af Amer: >90 mL/min (ref >90)
Glucose, Bld: 126 mg/dL — ABNORMAL HIGH (ref 70–99)
Potassium: 3.3 mmol/L — ABNORMAL LOW (ref 3.5–5.1)
Sodium: 139 mmol/L (ref 135–145)
Total Bilirubin: 1.3 mg/dL — ABNORMAL HIGH (ref 0.3–1.2)
Total Protein: 6.6 g/dL (ref 6.0–8.3)

## 2014-07-30 LAB — CBC WITH DIFFERENTIAL/PLATELET
Basophils Absolute: 0 10*3/uL (ref 0.0–0.1)
Basophils Relative: 0 % (ref 0–1)
Eosinophils Absolute: 0 10*3/uL (ref 0.0–0.7)
Eosinophils Relative: 0 % (ref 0–5)
HCT: 52.9 % — ABNORMAL HIGH (ref 36.0–46.0)
Hemoglobin: 15 g/dL (ref 12.0–15.0)
Lymphocytes Relative: 3 % — ABNORMAL LOW (ref 12–46)
Lymphs Abs: 0.2 10*3/uL — ABNORMAL LOW (ref 0.7–4.0)
MCH: 25.1 pg — ABNORMAL LOW (ref 26.0–34.0)
MCHC: 28.4 g/dL — ABNORMAL LOW (ref 30.0–36.0)
MCV: 88.6 fL (ref 78.0–100.0)
Monocytes Absolute: 0.4 10*3/uL (ref 0.1–1.0)
Monocytes Relative: 5 % (ref 3–12)
Neutro Abs: 7.8 10*3/uL — ABNORMAL HIGH (ref 1.7–7.7)
Neutrophils Relative %: 92 % — ABNORMAL HIGH (ref 43–77)
Platelets: 168 10*3/uL (ref 150–400)
RBC: 5.97 MIL/uL — ABNORMAL HIGH (ref 3.87–5.11)
RDW: 18.6 % — ABNORMAL HIGH (ref 11.5–15.5)
WBC: 8.5 10*3/uL (ref 4.0–10.5)

## 2014-07-30 LAB — MAGNESIUM: Magnesium: 1.9 mg/dL (ref 1.5–2.5)

## 2014-07-30 LAB — PHOSPHORUS: Phosphorus: 4.2 mg/dL (ref 2.3–4.6)

## 2014-07-30 MED ORDER — SODIUM CHLORIDE 0.9 % IV SOLN: INTRAVENOUS | Status: DC

## 2014-07-30 MED ORDER — MAGNESIUM SULFATE 50 % IJ SOLN
2.0000 g | Freq: Once | INTRAVENOUS | Status: AC
  Administered 2014-07-30: 2 g via INTRAVENOUS
  Filled 2014-07-30: qty 4

## 2014-07-30 MED ORDER — POTASSIUM CHLORIDE CRYS ER 20 MEQ PO TBCR
40.0000 meq | EXTENDED_RELEASE_TABLET | Freq: Once | ORAL | Status: AC
  Administered 2014-07-30: 40 meq via ORAL
  Filled 2014-07-30: qty 2

## 2014-07-30 MED ORDER — METHYLPREDNISOLONE SODIUM SUCC 125 MG IJ SOLR
60.0000 mg | Freq: Three times a day (TID) | INTRAMUSCULAR | Status: DC
  Administered 2014-07-30 – 2014-07-31 (×3): 60 mg via INTRAVENOUS
  Filled 2014-07-30 (×6): qty 0.96

## 2014-07-30 MED ORDER — MAGNESIUM SULFATE 2 GM/50ML IV SOLN: 2.0000 g | Freq: Once | INTRAVENOUS | Status: DC

## 2014-07-30 MED ORDER — FUROSEMIDE 40 MG PO TABS
40.0000 mg | ORAL_TABLET | Freq: Every day | ORAL | Status: DC
  Administered 2014-07-31: 40 mg via ORAL
  Filled 2014-07-30: qty 1

## 2014-07-30 NOTE — Progress Notes (Signed)
CRITICAL VALUE ALERT  Critical value received:  CO2 48  Date of notification:  07/30/2014  Time of notification:  0500  Critical value read back:Yes.    Nurse who received alert:  Ruben Im, RN  MD notified (1st page):    Time of first page:    MD notified (2nd page):  Time of second page:  Responding MD:    Time MD responded:    Value consistent with previous results.  Will continue to monitor pt.

## 2014-07-30 NOTE — Progress Notes (Signed)
PULMONARY  / [place]   Name: Deborah [name] MRN: [id number] DOB: Nov 09, 1955    ADMISSION DATE:  07/25/2014 CONSULTATION DATE: 07/25/14  REQUESTING CLINICIAN: Dr. Verlon Au PRIMARY SERVICE: [facility]  CHIEF COMPLAINT:  SOB, edema  BRIEF PATIENT DESCRIPTION: 6 yof with PMH of morbid obesity, mild COPD, OSA presents with hypercarbic, hypoxic resp failure likely due to volume overload from previously undx CHF.   CULTURES: Results for orders placed or performed during the hospital encounter of 07/25/14  Culture, blood (routine x 2)     Status: None (Preliminary result)   Collection Time: 07/25/14  5:14 PM  Result Value Ref Range Status   Specimen Description BLOOD LEFT ANTECUBITAL  Final   Special Requests BOTTLES DRAWN AEROBIC AND ANAEROBIC 5 CC EACH  Final   Culture   Final           BLOOD CULTURE RECEIVED NO GROWTH TO DATE CULTURE WILL BE HELD FOR 5 DAYS BEFORE ISSUING A FINAL NEGATIVE REPORT Performed at [facility]    Report Status PENDING  Incomplete  Culture, blood (routine x 2)     Status: None (Preliminary result)   Collection Time: 07/25/14  5:14 PM  Result Value Ref Range Status   Specimen Description BLOOD RIGHT ANTECUBITAL  Final   Special Requests BOTTLES DRAWN AEROBIC AND ANAEROBIC 5 CC EACH  Final   Culture   Final           BLOOD CULTURE RECEIVED NO GROWTH TO DATE CULTURE WILL BE HELD FOR 5 DAYS BEFORE ISSUING A FINAL NEGATIVE REPORT Performed at [facility]    Report Status PENDING  Incomplete  MRSA PCR Screening     Status: None   Collection Time: 07/25/14  6:33 PM  Result Value Ref Range Status   MRSA by PCR NEGATIVE NEGATIVE Final    Comment:        The GeneXpert MRSA Assay (FDA approved for NASAL specimens only), is one component of a comprehensive MRSA colonization surveillance program. It is not intended to diagnose MRSA infection nor to guide or monitor treatment for MRSA infections.   Culture, expectorated  sputum-assessment     Status: None   Collection Time: 07/29/14  1:52 PM  Result Value Ref Range Status   Specimen Description SPUTUM  Final   Special Requests Normal  Final   Sputum evaluation   Final    THIS SPECIMEN IS ACCEPTABLE. RESPIRATORY CULTURE REPORT TO FOLLOW.   Report Status 07/29/2014 FINAL  Final  Culture, respiratory (NON-Expectorated)     Status: None (Preliminary result)   Collection Time: 07/29/14  1:52 PM  Result Value Ref Range Status   Specimen Description SPUTUM  Final   Special Requests NONE  Final   Gram Stain   Final    RARE WBC PRESENT, PREDOMINANTLY MONONUCLEAR RARE SQUAMOUS EPITHELIAL CELLS PRESENT FEW GRAM POSITIVE COCCI IN PAIRS IN CLUSTERS Performed at [facility]    Culture   Final    Culture reincubated for better growth Performed at [facility]    Report Status PENDING  Incomplete     ANTIBIOTICS: Anti-infectives    Start     Dose/Rate Route Frequency Ordered Stop   07/27/14 1000  doxycycline (VIBRA-TABS) tablet 100 mg  Status:  Discontinued     100 mg Oral Every 12 hours 07/27/14 0933 07/27/14 0947   07/27/14 0900  doxycycline (VIBRAMYCIN) 100 mg in dextrose 5 % 250 mL IVPB  Status:  Discontinued  100 mg125 mL/hr over 120 Minutes Intravenous Every 12 hours 07/27/14 0753 07/27/14 0933   07/26/14 1700  azithromycin (ZITHROMAX) 500 mg in dextrose 5 % 250 mL IVPB  Status:  Discontinued     500 mg250 mL/hr over 60 Minutes Intravenous Every 24 hours 07/25/14 1845 07/25/14 2053   07/25/14 2200  cefTRIAXone (ROCEPHIN) 1 g in dextrose 5 % 50 mL IVPB  Status:  Discontinued     1 g100 mL/hr over 30 Minutes Intravenous Every 24 hours 07/25/14 2053 07/26/14 1008   07/25/14 1715  azithromycin (ZITHROMAX) 500 mg in dextrose 5 % 250 mL IVPB     500 mg250 mL/hr over 60 Minutes Intravenous  Once 07/25/14 1701 07/25/14 1815   07/25/14 1715  cefTRIAXone (ROCEPHIN) 1 g in dextrose 5 % 50 mL IVPB     1 g100 mL/hr over 30 Minutes Intravenous   Once 07/25/14 1701 07/25/14 1750      SIGNIFICANT EVENTS / STUDIES:  1/09  Admitted, placed on Bipap, CXR with enlarged cardiac silhouette  and ? RLL infiltrate vs atelectasis 1/10  ECHO >> nml LV systolic fxn, dilated hypocontractile RV c/w cor pulmonale 1/11  Tx to med floor.  reports feeling much better, improved swelling, breathing than on admit.    07/28/14:  Feels similar to v better   Yesterday. On face mask 88% pulse ox. On lasix gtt - 9L: negative. VQ negative for PE.  Primary team increased steroids for continued wheezing but noticed on ace inhibitor   07/29/14:  ACE inhibitor changed to ARB and steriouds increased yesterday and now wheezing better. But still on 50% FM. Husband at bedside  -both frustrated due to lack of info on medical course.    SUBJECTIVE/OVERNIGHT/INTERVAL HX 07/30/14: Says she is better. She walked hallway and per hsuband did well but RN says still on 50% FM and easy desats.  Lasiw now intermittent.  Used bipap at night  VITAL SIGNS: Temp:  [97.8 F (36.6 C)-98.1 F (36.7 C)] 98.1 F (36.7 C) (01/14 0553) Pulse Rate:  [81-90] 90 (01/14 0553) Resp:  [18-19] 18 (01/14 0553) BP: (112-129)/(63-91) 129/70 mmHg (01/14 0553) SpO2:  [91 %-95 %] 91 % (01/14 0920) FiO2 (%):  [55 %] 55 % (01/14 0920) Weight:  [256 lb 3.2 oz (962.229 kg)] 256 lb 3.2 oz (116.212 kg) (01/14 0553)  VENTILATOR SETTINGS: Vent Mode:  [-]  FiO2 (%):  [55 %] 55 %  INTAKE / OUTPUT: Intake/Output      01/13 0701 - 01/14 0700 01/14 0701 - 01/15 0700   P.O. 720    I.V. (mL/kg)     Total Intake(mL/kg) 720 (6.2)    Urine (mL/kg/hr) 3725 (1.3)    Stool 0 (0)    Total Output 3725     Net -3005          Stool Occurrence 1 x      PHYSICAL EXAMINATION: General:  Obese, on face mask + NAD Neuro:  Alert and Ox3, nonfocal HEENT:  Mm pink/moist, PERRL, short/thick neck Cardiovascular:  Tachy,no m/r/g but distant heart sounds Lungs:  resp's even/non-labored on 50% FM , Minmimal wheeze  only Abdomen:  Protuberant, +BS Musculoskeletal:  2+ LE edema  LABS: PULMONARY  Recent Labs Lab 07/25/14 1800 07/25/14 1944 07/26/14 0834 07/27/14 1741  PHART 7.268* 7.258* 7.330* 7.338*  PCO2ART 72.4* 75.9* 68.8* 79.6*  PO2ART 55.4* 63.9* 62.8* 61.6*  HCO3 32.0* 32.9* 35.3* 41.6*  TCO2 28.7 29.5 31.4 36.9  O2SAT 85.0 89.5 90.7 89.4  CBC  Recent Labs Lab 07/26/14 0425 07/28/14 0504 07/30/14 0423  HGB 15.1* 14.1 15.0  HCT 52.6* 50.4* 52.9*  WBC 6.9 7.3 8.5  PLT 164 177 168    COAGULATION No results for input(s): INR in the last 168 hours.  CARDIAC    Recent Labs Lab 07/25/14 1534 07/25/14 2130 07/26/14 0425  TROPONINI <0.03 <0.03 <0.03   No results for input(s): PROBNP in the last 168 hours.   CHEMISTRY  Recent Labs Lab 07/26/14 0425 07/27/14 0330 07/28/14 0504 07/29/14 0357 07/30/14 0423  NA 136 138 141 141 139  K 4.8 4.0 3.5 3.6 3.3*  CL 93* 94* 89* 85* 80*  CO2 35* 32 44* 46* 48*  GLUCOSE 120* 53* 111* 125* 126*  BUN 13 13 13 14 12   CREATININE 0.76 0.70 0.63 0.65 0.62  CALCIUM 8.1* 8.2* 8.5 8.5 8.6  MG  --   --   --   --  1.9  PHOS  --   --   --   --  4.2   Estimated Creatinine Clearance: 98.3 mL/min (by C-G formula based on Cr of 0.62).   LIVER  Recent Labs Lab 07/25/14 1534 07/25/14 1801 07/26/14 0425 07/30/14 0423  AST 36 33 31 47*  ALT 30 31 28  36*  ALKPHOS 85 80 72 55  BILITOT 0.6 0.4 0.4 1.3*  PROT 7.1 6.9 6.6 6.6  ALBUMIN 3.0* 2.9* 2.8* 3.2*     INFECTIOUS  Recent Labs Lab 07/25/14 1801 07/27/14 0330 07/29/14 0357  LATICACIDVEN 1.4  --   --   PROCALCITON <0.10 <0.10 <0.10     ENDOCRINE CBG (last 3)  No results for input(s): GLUCAP in the last 72 hours.       IMAGING x48h Dg Chest 2 View  07/28/2014   CLINICAL DATA:  59 year old with shortness of breath and weakness  EXAM: CHEST  2 VIEW  COMPARISON:  07/25/2014  FINDINGS: The cardiac silhouette is markedly enlarged. The mediastinal contours are  unchanged.  Bibasilar lung base consolidation is noted. The left costophrenic angle is blunted, compatible with a moderate-sized pleural effusion on lateral radiograph. There is no overt pulmonary edema. There is no pneumothorax.  The osseous structures are unchanged.  IMPRESSION: 1. Cardiomegaly. 2. Bibasilar consolidation, atelectasis versus pneumonitis. 3. Moderate left pleural effusion.   Electronically Signed   By: Rosemarie Ax   On: 07/28/2014 13:01        ASSESSMENT / PLAN:  PULMONARY A: Acute Hypoxic, hypercarbic Resp Failure - secondary to CHF and volume overload.  Suspect R heart failure and secondary PAH are largest issue here ?? RML CAP vs atx and collapse; clinical course inconsistent w infection Mild COPD - reportedly has had PFT and was told "her lungs were fine".  I dont have access to PFT at this time but suspect that she has mixed disease.  Tobacco Abuse - ongoing, hx up to 3 ppd, currently 1/2 ppd   - 07/30/2014 - though better still very hypoxemic. Suspect due to severe cor pulmonale from OSA, and atelectasis from obesity and diastolic dysfn. Unchanged but seems to handle exertion - this makes me suspect atelectasis   P:   Get Chest CT (was not ordered yesterday) No ACE inhibitor due to wheeze  Intermittent lasix from 07/29/14 Solu-medrol  Increased by [facility]ad 07/28/14 - reduce 07/30/2014 Continue  scheduled duoneb q4h (aim for spiriva prior to dc) Continue Albuterol, Singular,  Chantix  Mobilize Consider LTAC/ INpatient rehab  Amulatory assessment of O2  needs prior to discharge Will need outpatient sleep study  Smoking cessation  needed Monitor off abx   CARDIOVASCULAR A: Probable PAH with cor pulmonale Possible new dx CHF, unclear diastolic or systolic or both at this time   P:   Per cards  RENAL A: Low k and low mag P:   replete Monitor I's and O's Trend BMP with lasix gtt  GASTROINTESTINAL A: Morbid Obesity  P:   Fluid restricted  diet  HEMATOLOGIC A: No acute issues P:   Monitor CBC   INFECTIOUS A: Possible RML CAP. Off abx. PCT negative P:   follow clinically.     NEUROLOGIC A: Bipolar Disorder P:   Per Primary SVC   GLOBAL 07/30/2014 : husband and wife/patient  updated. Aim to mobilize. Consider LTAC; might benefit due to medical needs and LTAC    Dr. Brand Males, M.D., [facility].C.P Pulmonary and Critical Care Medicine Staff Physician [facility] Pager: [phone], If no answer or between  15:00h - 7:00h: call [phone]  07/30/2014 9:27 AM

## 2014-07-30 NOTE — Progress Notes (Signed)
Rehab Admissions Coordinator Note:  Patient was screened by Cleatrice Burke for appropriateness for an Inpatient Acute Rehab Consult. Per PT recommendation  At this time, we are recommending Inpatient Rehab consult. I will contact Attending MD.  Cleatrice Burke 07/30/2014, 2:54 PM  I can be reached at [phone].

## 2014-07-30 NOTE — Progress Notes (Signed)
Pt asked to be placed on Bipap around midnight. RT will return at that time.

## 2014-07-30 NOTE — Progress Notes (Signed)
[facility] PROGRESS NOTE  Deborah [name] UVO:[id number] DOB: 1956-02-06 DOA: 07/25/2014 PCP: No primary care provider on file.  Summary/Subjective Appreciate cardiology/pulmonary help. Deborah [name] is a pleasant 59 y/o female smoker with COPD, essential Htn, Morbid obesity, Body mass index is 45.12 kg/(m^2)., bipolar disorder, who came to the emergency room with complaints of feeling poorly for the past 6 months followed by worsening short of breath on exertion as well as increasing lower extremity edema. She is being treated for "hypercarbic, hypoxic resp failure likely due to volume overload from previously undx CHF". Her CXR showed "1. Cardiomegaly. 2. Bibasilar consolidation, atelectasis versus pneumonitis. 3. Moderate left pleural effusion.". 2D Echo showed EF 50-55% with grade 1 diastolic dysfunction with suggestion of Corpulmonale. She had cardiac cath which showed?. Ct chest sows "1. Moderate volume right pleural effusion with right lower lobe airspace consolidation. The right lower lobe is difficult to assess due to lack of IV contrast material. If there are no signs or symptoms of infection then consider diagnostic and therapeutic right thoracentesis and (if the patient can tolerate IV contrast material) a repeat, contrast enhanced CT of the chest to assess for underlying chest lesion. 2. Smaller left pleural effusion with overlying compressive type atelectasis and consolidation. 3. Prior granulomatous disease".  She reports continued improvement. Will defer to pulmonary for further management and continue with diuresis. Will lower dose of Lasix as blood pressure borderline low. We'll consult inpatient rehabilitation. Acute respiratory failure with hypoxia and hypercarbia due to Acute diastolic HF/COPD exacerbation:  - Continue management per pulmonary/cardiology. - Continue Lasix at a lower dose of 40 mg daily. - For CT chest - Monitor electrolytes. We'll replenish  potassium  Essential Hypertension - Her blood pressure is borderline low.  - Lower Lasix dose  CRLD (chronic restrictive lung disease) secondary to super morbid obesity with mild COPD component - Continue current home medications.  Tobacco abuse disorder: - Counseling.  Bipolar 1 disorder: - On no medications at home.  Code Status: Full Code. Family Communication: Called Deborah [name] at [phone], and left message. Disposition Plan: Eventually home.   Consultants:  Pulmonary/Cardiology.  Procedures:  Cardiac Cath  Antibiotics: None at present. Objective: Filed Vitals:   07/30/14 1436  BP: 96/48  Pulse: 88  Temp: 98.2 F (36.8 C)  Resp: 20    Intake/Output Summary (Last 24 hours) at 07/30/14 1807 Last data filed at 07/30/14 1355  Gross per 24 hour  Intake    720 ml  Output   5225 ml  Net  -4505 ml   Filed Weights   07/28/14 0626 07/29/14 0641 07/30/14 0553  Weight: 125 kg (275 lb 9.2 oz) 116.03 kg (255 lb 12.8 oz) 116.212 kg (256 lb 3.2 oz)    Exam:   General:  Sitting up. Not in distress.  Cardiovascular: RRR. No murmurs. S1S2 heard.  Respiratory: Good air entry bilaterally. Bibasilar rhonchi. No wheezing.  Abdomen: Soft and nontender. Normal bowel sounds.  Musculoskeletal: No pedal edema.  Data Reviewed: Basic Metabolic Panel:  Recent Labs Lab 07/26/14 0425 07/27/14 0330 07/28/14 0504 07/29/14 0357 07/30/14 0423  NA 136 138 141 141 139  K 4.8 4.0 3.5 3.6 3.3*  CL 93* 94* 89* 85* 80*  CO2 35* 32 44* 46* 48*  GLUCOSE 120* 53* 111* 125* 126*  BUN 13 13 13 14 12   CREATININE 0.76 0.70 0.63 0.65 0.62  CALCIUM 8.1* 8.2* 8.5 8.5 8.6  MG  --   --   --   --  1.9  PHOS  --   --   --   --  4.2   Liver Function Tests:  Recent Labs Lab 07/25/14 1534 07/25/14 1801 07/26/14 0425 07/30/14 0423  AST 36 33 31 47*  ALT 30 31 28  36*  ALKPHOS 85 80 72 55  BILITOT 0.6 0.4 0.4 1.3*  PROT 7.1 6.9 6.6 6.6  ALBUMIN 3.0* 2.9* 2.8* 3.2*    No results for input(s): LIPASE, AMYLASE in the last 168 hours. No results for input(s): AMMONIA in the last 168 hours. CBC:  Recent Labs Lab 07/25/14 1534 07/26/14 0425 07/28/14 0504 07/30/14 0423  WBC 7.5 6.9 7.3 8.5  NEUTROABS 5.1  --   --  7.8*  HGB 14.7 15.1* 14.1 15.0  HCT 52.6* 52.6* 50.4* 52.9*  MCV 88.0 88.6 88.3 88.6  PLT 208 164 177 168   Cardiac Enzymes:  Recent Labs Lab 07/25/14 1534 07/25/14 2130 07/26/14 0425  TROPONINI <0.03 <0.03 <0.03   BNP (last 3 results) No results for input(s): PROBNP in the last 8760 hours. CBG: No results for input(s): GLUCAP in the last 168 hours.  Recent Results (from the past 240 hour(s))  Culture, blood (routine x 2)     Status: None (Preliminary result)   Collection Time: 07/25/14  5:14 PM  Result Value Ref Range Status   Specimen Description BLOOD LEFT ANTECUBITAL  Final   Special Requests BOTTLES DRAWN AEROBIC AND ANAEROBIC 5 CC EACH  Final   Culture   Final           BLOOD CULTURE RECEIVED NO GROWTH TO DATE CULTURE WILL BE HELD FOR 5 DAYS BEFORE ISSUING A FINAL NEGATIVE REPORT Performed at [facility]    Report Status PENDING  Incomplete  Culture, blood (routine x 2)     Status: None (Preliminary result)   Collection Time: 07/25/14  5:14 PM  Result Value Ref Range Status   Specimen Description BLOOD RIGHT ANTECUBITAL  Final   Special Requests BOTTLES DRAWN AEROBIC AND ANAEROBIC 5 CC EACH  Final   Culture   Final           BLOOD CULTURE RECEIVED NO GROWTH TO DATE CULTURE WILL BE HELD FOR 5 DAYS BEFORE ISSUING A FINAL NEGATIVE REPORT Performed at [facility]    Report Status PENDING  Incomplete  MRSA PCR Screening     Status: None   Collection Time: 07/25/14  6:33 PM  Result Value Ref Range Status   MRSA by PCR NEGATIVE NEGATIVE Final    Comment:        The GeneXpert MRSA Assay (FDA approved for NASAL specimens only), is one component of a comprehensive MRSA colonization surveillance  program. It is not intended to diagnose MRSA infection nor to guide or monitor treatment for MRSA infections.   Culture, expectorated sputum-assessment     Status: None   Collection Time: 07/29/14  1:52 PM  Result Value Ref Range Status   Specimen Description SPUTUM  Final   Special Requests Normal  Final   Sputum evaluation   Final    THIS SPECIMEN IS ACCEPTABLE. RESPIRATORY CULTURE REPORT TO FOLLOW.   Report Status 07/29/2014 FINAL  Final  Culture, respiratory (NON-Expectorated)     Status: None (Preliminary result)   Collection Time: 07/29/14  1:52 PM  Result Value Ref Range Status   Specimen Description SPUTUM  Final   Special Requests NONE  Final   Gram Stain   Final    RARE WBC PRESENT,  PREDOMINANTLY MONONUCLEAR RARE SQUAMOUS EPITHELIAL CELLS PRESENT FEW GRAM POSITIVE COCCI IN PAIRS IN CLUSTERS Performed at [facility]    Culture   Final    Culture reincubated for better growth Performed at [facility]    Report Status PENDING  Incomplete     Studies: Ct Chest Wo Contrast  07/30/2014   CLINICAL DATA:  Shortness of breath for 6 to 8 weeks. Smoker. History of COPD and hypertension.  EXAM: CT CHEST WITHOUT CONTRAST  TECHNIQUE: Multidetector CT imaging of the chest was performed following the standard protocol without IV contrast.  COMPARISON:  Chest radiograph 07/28/2016  FINDINGS: Mediastinum: The heart size appears normal. No significant Coronary artery calcifications identified. There is calcified plaque within the aortic arch. There is calcified atherosclerotic disease involving the thoracic aorta. The trachea appears patent and is midline. Calcified right hilar lymph nodes are identified. No mediastinal adenopathy identified.  Lungs/Pleura: Small left pleural effusion with mild overlying consolidation identified in the left lower lobe. Suspect moderate volume right pleural effusion with airspace consolidation involving the right lower lobe. There may be a  centrally located lesion within the right lower lobe measuring mm 3 cm, image 32/series 2. This is difficult to assess due to lack of IV contrast material.  Upper Abdomen: Incidental imaging within the upper abdomen shows no acute findings. The visualized portions of the liver are normal. There are calcified granulomas within the spleen. The adrenal glands are unremarkable. Review of the visualized osseous structures is on unremarkable. No aggressive lytic or sclerotic bone lesions identified.  Musculoskeletal: No aggressive lytic or sclerotic bone lesions.  IMPRESSION: 1. Moderate volume right pleural effusion with right lower lobe airspace consolidation. The right lower lobe is difficult to assess due to lack of IV contrast material. If there are no signs or symptoms of infection then consider diagnostic and therapeutic right thoracentesis and (if the patient can tolerate IV contrast material) a repeat, contrast enhanced CT of the chest to assess for underlying chest lesion. 2. Smaller left pleural effusion with overlying compressive type atelectasis and consolidation. 3. Prior granulomatous disease   Electronically Signed   By: Kerby Moors M.D.   On: 07/30/2014 11:45    Scheduled Meds: . antiseptic oral rinse  7 mL Mouth Rinse q12n4p  . aspirin  81 mg Oral Daily  . chlorhexidine  15 mL Mouth Rinse BID  . furosemide  80 mg Oral Daily  . heparin  5,000 Units Subcutaneous 3 times per day  . ipratropium-albuterol  3 mL Nebulization Q4H  . losartan  50 mg Oral Daily  . methylPREDNISolone (SOLU-MEDROL) injection  60 mg Intravenous 3 times per day  . metoprolol tartrate  12.5 mg Oral BID  . montelukast  10 mg Oral QHS  . nicotine  14 mg Transdermal Daily  . sodium chloride  3 mL Intravenous Q12H  . varenicline  1 mg Oral Daily   Continuous Infusions: . sodium chloride      Principal Problem:   Acute respiratory failure with hypoxia and hypercarbia Active Problems:   Shortness of breath    CHF, acute on chronic, unknown EF   Hypertension   Hyperkalemia   CRLD (chronic restrictive lung disease) secondary to super morbid obesity with mild COPD component   Tobacco abuse disorder   Bipolar 1 disorder   Acute respiratory acidosis   Pulmonary HTN   Acute diastolic heart failure   COPD exacerbation    Time spent: 25 minutes.    Deborah [name]  [facility] Pager [phone]. If 7PM-7AM, please contact night-coverage at [email or web address], password [facility] 07/30/2014, 6:07 PM  LOS: 5 days

## 2014-07-30 NOTE — Consult Note (Signed)
Physical Medicine and Rehabilitation Consult  Reason for Consult: Deconditioning Referring Physician: Dr. Sanjuana Letters   HPI: Deborah [name] is a 59 y.o. female with history of HTN, morbid obesity, mild COPD, OSA who was admitted on 07/25/13 with hypoxic respiratory failure due to volume overload.   She was treated with aggressive diuresis and BIPAP. Cardiology was consulted for input and 2D echo revealed grade 1 diastolic dysfunction with ventricular septal motion paradox and EF 50-55%. V/Q scan negative for PE and right heart failure felt to be multifactorial due pulmonary HTN/COPD/OSA and patient to follow up with cardiology past discharge.  Respiratory status is improving but patient continues to have high oxygen needs with activity.  MD/PT recommending CIR for follow up therapy   ROS    Past Medical History  Diagnosis Date  . Hypertension   . Restrictive lung disease   . COPD, mild   . Heart failure     New onset 07/25/14  . Obesity (BMI 30-39.9)   . Tobacco abuse   . Hyperkalemia    History reviewed. No pertinent past surgical history. Family History  Problem Relation Age of Onset  . Emphysema      Mother  . Heart failure      Father  . Cancer      Unknown    Social History:  reports that she has been smoking.  She does not have any smokeless tobacco history on file. She reports that she drinks alcohol. Her drug history is not on file.    Allergies: No Known Allergies    Medications Prior to Admission  Medication Sig Dispense Refill  . Aspirin-Salicylamide-Caffeine (BC HEADACHE PO) Take 1 packet by mouth 2 (two) times daily as needed (backache).      Home: Home Living Family/patient expects to be discharged to:: Private residence Living Arrangements: Spouse/significant other (daughter; both work) Type of Home: [place]: Stairs to enter Technical brewer of Steps: 15 Entrance Stairs-Rails: Right Home Layout: One [place]:  Grab bars - tub/shower  Functional History: Prior Function Level of Independence: Needs assistance Gait / Transfers Assistance Needed: household ambulation without device ADL's / Homemaking Assistance Needed: assist into tub/shower-sits on stool-assist to bathe Functional Status:  Mobility: Bed Mobility Overal bed mobility: Needs Assistance Bed Mobility: Supine to Sit Supine to sit: Min guard General bed mobility comments: pt OOB in recliner Transfers Overall transfer level: Needs assistance Equipment used: Rolling walker (2 wheeled) Transfers: Sit to/from Stand Sit to Stand: Min guard General transfer comment: close guard for safety Ambulation/Gait Ambulation/Gait assistance: Min guard Ambulation Distance (Feet): 150 Feet (x2) Assistive device:  (1 handrail on one side; pt pushing dynamap on other side) Gait Pattern/deviations: Step-through pattern, Decreased stride length General Gait Details: slightly unsteady so pt preferred to hold onto hallway handrail/furniture walk (in additon to holding onto dynampa). 1 standing rest break needed during each walk. 1 seated rest break between walks.     ADL: ADL Overall ADL's : Needs assistance/impaired Eating/Feeding: Set up, Sitting Grooming: Set up, Sitting Upper Body Bathing: Minimal assitance, Sitting Lower Body Bathing: Sit to/from stand Upper Body Dressing : Minimal assistance, Sitting Lower Body Dressing: Sit to/from stand, Minimal assistance, With adaptive equipment Toileting- Clothing Manipulation and Hygiene: Sit to/from stand, Minimal assistance General ADL Comments: instructed pt in AE as well as energy conservation. Pt needed increased time and VC for taking deep breathes.   Cognition: Cognition Overall Cognitive Status: Within Functional Limits for tasks assessed  Cognition Arousal/Alertness: Awake/alert Behavior During Therapy: WFL for tasks assessed/performed Overall Cognitive Status: Within Functional Limits for  tasks assessed  Blood pressure 96/48, pulse 88, temperature 98.2 F (36.8 C), temperature source Oral, resp. rate 20, height 5\' 4"  (1.626 m), weight 116.212 kg (256 lb 3.2 oz), SpO2 96 %. Physical Exam  Results for orders placed or performed during the hospital encounter of 07/25/14 (from the past 24 hour(s))  CBC with Differential     Status: Abnormal   Collection Time: 07/30/14  4:23 AM  Result Value Ref Range   WBC 8.5 4.0 - 10.5 K/uL   RBC 5.97 (H) 3.87 - 5.11 MIL/uL   Hemoglobin 15.0 12.0 - 15.0 g/dL   HCT 52.9 (H) 36.0 - 46.0 %   MCV 88.6 78.0 - 100.0 fL   MCH 25.1 (L) 26.0 - 34.0 pg   MCHC 28.4 (L) 30.0 - 36.0 g/dL   RDW 18.6 (H) 11.5 - 15.5 %   Platelets 168 150 - 400 K/uL   Neutrophils Relative % 92 (H) 43 - 77 %   Neutro Abs 7.8 (H) 1.7 - 7.7 K/uL   Lymphocytes Relative 3 (L) 12 - 46 %   Lymphs Abs 0.2 (L) 0.7 - 4.0 K/uL   Monocytes Relative 5 3 - 12 %   Monocytes Absolute 0.4 0.1 - 1.0 K/uL   Eosinophils Relative 0 0 - 5 %   Eosinophils Absolute 0.0 0.0 - 0.7 K/uL   Basophils Relative 0 0 - 1 %   Basophils Absolute 0.0 0.0 - 0.1 K/uL  Comprehensive metabolic panel     Status: Abnormal   Collection Time: 07/30/14  4:23 AM  Result Value Ref Range   Sodium 139 135 - 145 mmol/L   Potassium 3.3 (L) 3.5 - 5.1 mmol/L   Chloride 80 (L) 96 - 112 mEq/L   CO2 48 (HH) 19 - 32 mmol/L   Glucose, Bld 126 (H) 70 - 99 mg/dL   BUN 12 6 - 23 mg/dL   Creatinine, Ser 0.62 0.50 - 1.10 mg/dL   Calcium 8.6 8.4 - 10.5 mg/dL   Total Protein 6.6 6.0 - 8.3 g/dL   Albumin 3.2 (L) 3.5 - 5.2 g/dL   AST 47 (H) 0 - 37 U/L   ALT 36 (H) 0 - 35 U/L   Alkaline Phosphatase 55 39 - 117 U/L   Total Bilirubin 1.3 (H) 0.3 - 1.2 mg/dL   GFR calc non Af Amer >90 >90 mL/min   GFR calc Af Amer >90 >90 mL/min   Anion gap 11 5 - 15  Magnesium     Status: None   Collection Time: 07/30/14  4:23 AM  Result Value Ref Range   Magnesium 1.9 1.5 - 2.5 mg/dL  Phosphorus     Status: None   Collection Time:  07/30/14  4:23 AM  Result Value Ref Range   Phosphorus 4.2 2.3 - 4.6 mg/dL   Ct Chest Wo Contrast  07/30/2014   CLINICAL DATA:  Shortness of breath for 6 to 8 weeks. Smoker. History of COPD and hypertension.  EXAM: CT CHEST WITHOUT CONTRAST  TECHNIQUE: Multidetector CT imaging of the chest was performed following the standard protocol without IV contrast.  COMPARISON:  Chest radiograph 07/28/2016  FINDINGS: Mediastinum: The heart size appears normal. No significant Coronary artery calcifications identified. There is calcified plaque within the aortic arch. There is calcified atherosclerotic disease involving the thoracic aorta. The trachea appears patent and is midline. Calcified right hilar lymph nodes  are identified. No mediastinal adenopathy identified.  Lungs/Pleura: Small left pleural effusion with mild overlying consolidation identified in the left lower lobe. Suspect moderate volume right pleural effusion with airspace consolidation involving the right lower lobe. There may be a centrally located lesion within the right lower lobe measuring mm 3 cm, image 32/series 2. This is difficult to assess due to lack of IV contrast material.  Upper Abdomen: Incidental imaging within the upper abdomen shows no acute findings. The visualized portions of the liver are normal. There are calcified granulomas within the spleen. The adrenal glands are unremarkable. Review of the visualized osseous structures is on unremarkable. No aggressive lytic or sclerotic bone lesions identified.  Musculoskeletal: No aggressive lytic or sclerotic bone lesions.  IMPRESSION: 1. Moderate volume right pleural effusion with right lower lobe airspace consolidation. The right lower lobe is difficult to assess due to lack of IV contrast material. If there are no signs or symptoms of infection then consider diagnostic and therapeutic right thoracentesis and (if the patient can tolerate IV contrast material) a repeat, contrast enhanced CT of  the chest to assess for underlying chest lesion. 2. Smaller left pleural effusion with overlying compressive type atelectasis and consolidation. 3. Prior granulomatous disease   Electronically Signed   By: Kerby Moors M.D.   On: 07/30/2014 11:45    Assessment/Plan: Diagnosis: dyspnea with exertion 1. Does the need for close, 24 hr/day medical supervision in concert with the patient's rehab needs make it unreasonable for this patient to be served in a less intensive setting? No 2. Co-Morbidities requiring supervision/potential complications:   3. Due to bladder management, bowel management, safety, disease management and medication administration, does the patient require 24 hr/day rehab nursing? No 4. Does the patient require coordinated care of a physician, rehab nurse, PT, OT to address physical and functional deficits in the context of the above medical diagnosis(es)? No Addressing deficits in the following areas: balance, locomotion, strength, transferring, bowel/bladder control, dressing and grooming 5. Can the patient actively participate in an intensive therapy program of at least 3 hrs of therapy per day at least 5 days per week? Potentially 6. The potential for patient to make measurable gains while on inpatient rehab is fair 7. Anticipated functional outcomes upon discharge from inpatient rehab are n/a  with PT, n/a with OT, n/a with SLP. 8. Estimated rehab length of stay to reach the above functional goals is:  n/a 9. Does the patient have adequate social supports and living environment to accommodate these discharge functional goals? Potentially 10. Anticipated D/C setting: Other 11. Anticipated post D/C treatments: N/A 12. Overall Rehab/Functional Prognosis: good  RECOMMENDATIONS: This patient's condition is appropriate for continued rehabilitative care in the following setting: SNF and [place] [facility] Patient has agreed to participate in recommended program. Potentially Note that  insurance prior authorization may be required for reimbursement for recommended care.  Comment: Pt is min assist to contact guard assist. Primary issue is dyspnea with exertion. Cannot justify 3 hours of interdisciplinary therapy based on her current functional levels. If she is unable to go home, then short term SNF will need to be pursued.  Meredith Staggers, MD, [facility] 07/31/2014     07/30/2014

## 2014-07-30 NOTE — Progress Notes (Signed)
Pt selected [facility] for HHRN/PT and would benefit with the resp program. Will need HHRN/PT orders please.

## 2014-07-30 NOTE — Progress Notes (Signed)
Physical Therapy Treatment Patient Details Name: Deborah [name] MRN: [id number] DOB: [birth year] Today's Date: 07/30/2014    History of Present Illness 59 yo female admitted with acute respiratory failure. Hx of COPD, morbid obesity, bipolar d/o, HTN    PT Comments    Progressing with mobility and activity tolerance. Pt now on 6L O2. O2 sats 95% at rest, 83%, 128 bpm during ambulation. VCs for rest breaks and pursed lip breathing. Continues to require multiple rest breaks. Discussed d/c plan-pt could potentially benefit from CIR to improve strength, activity tolerance, and impaired gait and balance. Pt is undecided on her d/c plan at this time-she says she will discuss this with her husband.   Follow Up Recommendations  Home health PT;CIR (depending on pt progress and pt's final decision-she is currently unsure )     Equipment Recommendations   (to be determined)    Recommendations for Other Services Rehab consult     Precautions / Restrictions Precautions Precautions: Fall Precaution Comments: monitor O2 sats/high O2 requirement Restrictions Weight Bearing Restrictions: No    Mobility  Bed Mobility               General bed mobility comments: pt OOB in recliner  Transfers     Transfers: Sit to/from Stand Sit to Stand: Min guard         General transfer comment: close guard for safety  Ambulation/Gait Ambulation/Gait assistance: Min guard Ambulation Distance (Feet): 150 Feet (x2) Assistive device:  (1 handrail on one side; pt pushing dynamap on other side) Gait Pattern/deviations: Step-through pattern;Decreased stride length     General Gait Details: slightly unsteady so pt preferred to hold onto hallway handrail/furniture walk (in additon to holding onto dynampa). 1 standing rest break needed during each walk. 1 seated rest break between walks.    Stairs            Wheelchair Mobility    Modified Rankin (Stroke Patients Only)        Balance Overall balance assessment: Needs assistance         Standing balance support: During functional activity Standing balance-Leahy Scale: Fair                      Cognition Arousal/Alertness: Awake/alert Behavior During Therapy: WFL for tasks assessed/performed Overall Cognitive Status: Within Functional Limits for tasks assessed                      Exercises      General Comments        Pertinent Vitals/Pain Pain Assessment: No/denies pain    Home Living                      Prior Function            PT Goals (current goals can now be found in the care plan section) Progress towards PT goals: Progressing toward goals    Frequency       PT Plan      Co-evaluation             End of Session   Activity Tolerance: Patient tolerated treatment well Patient left: in chair;with call bell/phone within reach     Time: 1358-1435 PT Time Calculation (min) (ACUTE ONLY): 37 min  Charges:  $Gait Training: 23-37 mins                    G Codes:  Weston Anna, MPT Pager: [phone]

## 2014-07-30 NOTE — Progress Notes (Signed)
Occupational Therapy Treatment Patient Details Name: Deborah [name] MRN: [id number] DOB: [birth year] Today's Date: 07/30/2014    History of present illness 59 yo female admitted with acute respiratory failure. Hx of COPD, morbid obesity, bipolar d/o, HTN   OT comments  Would benefit from further energy conservation education  Follow Up Recommendations  SNF;Supervision/Assistance - 24 hour (depending on progress. Pt did share this day she is alone all day while husband at work)    Materials engineer for Other Services      Precautions / Restrictions Precautions Precautions: Fall Precaution Comments: monitor O2 sats/high O2 requirement Restrictions Weight Bearing Restrictions: No       Mobility Bed Mobility Overal bed mobility: Needs Assistance Bed Mobility: Supine to Sit     Supine to sit: Min guard        Transfers Overall transfer level: Needs assistance Equipment used: Rolling walker (2 wheeled) Transfers: Sit to/from Stand Sit to Stand: Min assist         General transfer comment: VC for safety and taking deep breathes    Balance                                   ADL                       Lower Body Dressing: Sit to/from stand;Minimal assistance;With adaptive equipment       Toileting- Clothing Manipulation and Hygiene: Sit to/from stand;Minimal assistance         General ADL Comments: instructed pt in AE as well as energy conservation. Pt needed increased time and VC for taking deep breathes.       Vision                     Perception     Praxis      Cognition   Behavior During Therapy: WFL for tasks assessed/performed Overall Cognitive Status: Within Functional Limits for tasks assessed                       Extremity/Trunk Assessment               Exercises     Shoulder Instructions       General Comments      Pertinent Vitals/ Pain           Home Living                                          Prior Functioning/Environment              Frequency Min 2X/week     Progress Toward Goals  OT Goals(current goals can now be found in the care plan section)  Progress towards OT goals: Progressing toward goals     Plan Discharge plan remains appropriate    Co-evaluation                 End of Session     Activity Tolerance Patient tolerated treatment well   Patient Left in chair   Nurse Communication Mobility status        Time: 0924-1000 OT Time Calculation (min): 36 min  Charges: OT General Charges $OT Visit: 1 Procedure OT  Treatments $Self Care/Home Management : 23-37 mins  Tamee Battin, Thereasa Parkin 07/30/2014, 10:29 AM

## 2014-07-31 LAB — CBC
HCT: 52.1 % — ABNORMAL HIGH (ref 36.0–46.0)
Hemoglobin: 14.8 g/dL (ref 12.0–15.0)
MCH: 24.9 pg — ABNORMAL LOW (ref 26.0–34.0)
MCHC: 28.4 g/dL — ABNORMAL LOW (ref 30.0–36.0)
MCV: 87.7 fL (ref 78.0–100.0)
Platelets: 159 10*3/uL (ref 150–400)
RBC: 5.94 MIL/uL — ABNORMAL HIGH (ref 3.87–5.11)
RDW: 18.7 % — ABNORMAL HIGH (ref 11.5–15.5)
WBC: 9 10*3/uL (ref 4.0–10.5)

## 2014-07-31 LAB — MAGNESIUM: Magnesium: 2.2 mg/dL (ref 1.5–2.5)

## 2014-07-31 LAB — COMPREHENSIVE METABOLIC PANEL
ALT: 84 U/L — ABNORMAL HIGH (ref 0–35)
AST: 81 U/L — ABNORMAL HIGH (ref 0–37)
Albumin: 3.1 g/dL — ABNORMAL LOW (ref 3.5–5.2)
Alkaline Phosphatase: 56 U/L (ref 39–117)
Anion gap: 10 (ref 5–15)
BUN: 12 mg/dL (ref 6–23)
CO2: 48 mmol/L (ref 19–32)
Calcium: 9 mg/dL (ref 8.4–10.5)
Chloride: 84 mEq/L — ABNORMAL LOW (ref 96–112)
Creatinine, Ser: 0.5 mg/dL (ref 0.50–1.10)
GFR calc Af Amer: >90 mL/min (ref >90)
GFR calc non Af Amer: >90 mL/min (ref >90)
Glucose, Bld: 115 mg/dL — ABNORMAL HIGH (ref 70–99)
Potassium: 3.2 mmol/L — ABNORMAL LOW (ref 3.5–5.1)
Sodium: 142 mmol/L (ref 135–145)
Total Bilirubin: 1.7 mg/dL — ABNORMAL HIGH (ref 0.3–1.2)
Total Protein: 6.2 g/dL (ref 6.0–8.3)

## 2014-07-31 LAB — CULTURE, BLOOD (ROUTINE X 2)
Culture: NO GROWTH
Culture: NO GROWTH

## 2014-07-31 MED ORDER — FUROSEMIDE 40 MG PO TABS
40.0000 mg | ORAL_TABLET | Freq: Two times a day (BID) | ORAL | Status: DC
  Administered 2014-08-01 – 2014-08-03 (×5): 40 mg via ORAL
  Filled 2014-07-31 (×5): qty 1

## 2014-07-31 MED ORDER — METHYLPREDNISOLONE SODIUM SUCC 125 MG IJ SOLR
60.0000 mg | Freq: Two times a day (BID) | INTRAMUSCULAR | Status: DC
  Administered 2014-07-31 – 2014-08-03 (×6): 60 mg via INTRAVENOUS
  Filled 2014-07-31 (×8): qty 0.96

## 2014-07-31 MED ORDER — POTASSIUM CHLORIDE 20 MEQ/15ML (10%) PO SOLN
40.0000 meq | Freq: Once | ORAL | Status: AC
  Administered 2014-07-31: 40 meq via ORAL
  Filled 2014-07-31: qty 30

## 2014-07-31 NOTE — Progress Notes (Signed)
RT placed pt on auto titrate Bipap Min EPAP-5cmH2O Max IPAP-20cmH2O with 6L of oxygen bled in via ffm. Pt states she is comfortable and is tolerating Bipap well at this time. RT will continue to monitor as needed.

## 2014-07-31 NOTE — Progress Notes (Signed)
Physical Therapy Treatment Patient Details Name: Deborah [name] MRN: [id number] DOB: 1956-02-12 Today's Date: 07/31/2014    History of Present Illness 59 yo female admitted with acute respiratory failure. Hx of COPD, morbid obesity, bipolar d/o, HTN    PT Comments    Pt continues to perform well during sessions-able to progress activity a little each day. O2 sats drop to 85%, HR 134 bpm on 6L O2 during activity; 91-91% on 6L at rest. Encouraged pt to having nursing walk with her in addition to PT sessions.   Follow Up Recommendations  Home health PT;CIR (depending on progress/pt decision)     Equipment Recommendations  Rolling walker with 5" wheels (to be determined-may need to consider 4 wheeled walker)    Recommendations for Other Services       Precautions / Restrictions Precautions Precautions: Fall Precaution Comments: monitor O2 sats/high O2 requirement Restrictions Weight Bearing Restrictions: No    Mobility  Bed Mobility         Supine to sit: HOB elevated;Supervision     General bed mobility comments: supervision for lines. Increased time.   Transfers Overall transfer level: Needs assistance   Transfers: Sit to/from Stand Sit to Stand: Min guard         General transfer comment: close guard for safety  Ambulation/Gait Ambulation/Gait assistance: Min guard Ambulation Distance (Feet): 160 Feet (160'x1, 135'x1) Assistive device:  (hallway handrail +dynamap) Gait Pattern/deviations: Step-through pattern;Decreased stride length     General Gait Details:  unsteady at times. pt preferred to hold onto hallway handrail/furniture walk (in additon to holding onto dynampa). 1 standing rest break needed during each walk. 1 seated rest break between walks.    Stairs            Wheelchair Mobility    Modified Rankin (Stroke Patients Only)       Balance           Standing balance support: No upper extremity supported;During functional  activity Standing balance-Leahy Scale: Fair                      Cognition Arousal/Alertness: Awake/alert Behavior During Therapy: WFL for tasks assessed/performed Overall Cognitive Status: Within Functional Limits for tasks assessed                      Exercises General Exercises - Lower Extremity Ankle Circles/Pumps: AROM;Both;10 reps;Seated Long Arc Quad: AROM;Both;Seated (8 reps) Hip Flexion/Marching: AROM;Both;5 reps;Seated    General Comments        Pertinent Vitals/Pain Pain Assessment: Faces Faces Pain Scale: Hurts little more Pain Location: L inner thigh when exercising Pain Descriptors / Indicators: Sore Pain Intervention(s): Monitored during session    Home Living                      Prior Function            PT Goals (current goals can now be found in the care plan section) Progress towards PT goals: Progressing toward goals    Frequency  Min 3X/week    PT Plan Current plan remains appropriate    Co-evaluation             End of Session   Activity Tolerance: Patient tolerated treatment well Patient left: in chair;with call bell/phone within reach     Time: 1517-6160 PT Time Calculation (min) (ACUTE ONLY): 28 min  Charges:  $Gait Training: 23-37 mins  G Codes:      Weston Anna, MPT Pager: [phone]

## 2014-07-31 NOTE — Progress Notes (Signed)
PULMONARY  / [place]   Name: Deborah [name] MRN: [id number] DOB: 17-Jun-1956    ADMISSION DATE:  07/25/2014 CONSULTATION DATE: 07/25/14  REQUESTING CLINICIAN: Dr. Verlon Au PRIMARY SERVICE: TR[facility]  CHIEF COMPLAINT:  SOB, edema  BRIEF PATIENT DESCRIPTION: 28 yof with PMH of morbid obesity, mild COPD, OSA presents with hypercarbic, hypoxic resp failure likely due to volume overload from previously undx CHF.   CULTURES: negative   ANTIBIOTICS: dixy - all complete Azithro - all complete Ceftriaxone - all complete   SIGNIFICANT EVENTS / STUDIES:  1/09  Admitted, placed on Bipap, CXR with enlarged cardiac silhouette  and ? RLL infiltrate vs atelectasis 1/10  ECHO >> nml LV systolic fxn, dilated hypocontractile RV c/w cor pulmonale 1/11  Tx to med floor.  reports feeling much better, improved swelling, breathing than on admit.    07/28/14:  Feels similar to v better   Yesterday. On face mask 88% pulse ox. On lasix gtt - 9L: negative. VQ negative for PE.  Primary team increased steroids for continued wheezing but noticed on ace inhibitor   07/29/14:  ACE inhibitor changed to ARB and steriouds increased yesterday and now wheezing better. But still on 50% FM. Husband at bedside  -both frustrated due to lack of info on medical course.    07/30/14: Says she is better. She walked hallway and per hsuband did well but RN says still on 50% FM and easy desats.  Lasiw now intermittent.  Used bipap at night   SUBJECTIVE/OVERNIGHT/INTERVAL HX 07/31/2014 : Huge improvement. Now on 6L [place]. Looks better as well. Less dyspneic and wheeze Used bipap overnight CT shows moderate effusion all consistent with pulmonary congestion.    VITAL SIGNS: Temp:  [97.5 F (36.4 C)-98.2 F (36.8 C)] 97.5 F (36.4 C) (01/15 0628) Pulse Rate:  [74-88] 82 (01/15 0628) Resp:  [18-20] 18 (01/15 0628) BP: (96-131)/(48-69) 117/64 mmHg (01/15 0628) SpO2:  [91 %-96 %] 93 % (01/15 0717) Weight:   [112.628 kg (248 lb 4.8 oz)] 112.628 kg (248 lb 4.8 oz) (01/15 0628)  VENTILATOR SETTINGS:    INTAKE / OUTPUT: Intake/Output      01/14 0701 - 01/15 0700 01/15 0701 - 01/16 0700   P.O. 480    Total Intake(mL/kg) 480 (4.3)    Urine (mL/kg/hr) 4550 (1.7)    Stool 0 (0)    Total Output 4550     Net -4070          Stool Occurrence 1 x      PHYSICAL EXAMINATION: General:  Obese, on face mask + NAD Neuro:  Alert and Ox3, nonfocal HEENT:  Mm pink/moist, PERRL, short/thick neck Cardiovascular:  Tachy,no m/r/g but distant heart sounds Lungs:  resp's even/non-labored on 6L  , Minmimal wheeze only - better Abdomen:  Protuberant, +BS Musculoskeletal:  1+ LE edema  LABS: PULMONARY  Recent Labs Lab 07/25/14 1800 07/25/14 1944 07/26/14 0834 07/27/14 1741  PHART 7.268* 7.258* 7.330* 7.338*  PCO2ART 72.4* 75.9* 68.8* 79.6*  PO2ART 55.4* 63.9* 62.8* 61.6*  HCO3 32.0* 32.9* 35.3* 41.6*  TCO2 28.7 29.5 31.4 36.9  O2SAT 85.0 89.5 90.7 89.4    CBC  Recent Labs Lab 07/28/14 0504 07/30/14 0423 07/31/14 0416  HGB 14.1 15.0 14.8  HCT 50.4* 52.9* 52.1*  WBC 7.3 8.5 9.0  PLT 177 168 159    COAGULATION No results for input(s): INR in the last 168 hours.  CARDIAC    Recent Labs Lab 07/25/14 1534 07/25/14 2130 07/26/14 0425  TROPONINI <0.03 <0.03 <0.03   No results for input(s): PROBNP in the last 168 hours.   CHEMISTRY  Recent Labs Lab 07/27/14 0330 07/28/14 0504 07/29/14 0357 07/30/14 0423 07/31/14 0416  NA 138 141 141 139 142  K 4.0 3.5 3.6 3.3* 3.2*  CL 94* 89* 85* 80* 84*  CO2 32 44* 46* 48* 48*  GLUCOSE 53* 111* 125* 126* 115*  BUN 13 13 14 12 12   CREATININE 0.70 0.63 0.65 0.62 0.50  CALCIUM 8.2* 8.5 8.5 8.6 9.0  MG  --   --   --  1.9 2.2  PHOS  --   --   --  4.2  --    Estimated Creatinine Clearance: 96.6 mL/min (by C-G formula based on Cr of 0.5).   LIVER  Recent Labs Lab 07/25/14 1534 07/25/14 1801 07/26/14 0425 07/30/14 0423  07/31/14 0416  AST 36 33 31 47* 81*  ALT 30 31 28  36* 84*  ALKPHOS 85 80 72 55 56  BILITOT 0.6 0.4 0.4 1.3* 1.7*  PROT 7.1 6.9 6.6 6.6 6.2  ALBUMIN 3.0* 2.9* 2.8* 3.2* 3.1*     INFECTIOUS  Recent Labs Lab 07/25/14 1801 07/27/14 0330 07/29/14 0357  LATICACIDVEN 1.4  --   --   PROCALCITON <0.10 <0.10 <0.10     ENDOCRINE CBG (last 3)  No results for input(s): GLUCAP in the last 72 hours.       IMAGING x48h Ct Chest Wo Contrast  07/30/2014   CLINICAL DATA:  Shortness of breath for 6 to 8 weeks. Smoker. History of COPD and hypertension.  EXAM: CT CHEST WITHOUT CONTRAST  TECHNIQUE: Multidetector CT imaging of the chest was performed following the standard protocol without IV contrast.  COMPARISON:  Chest radiograph 07/28/2016  FINDINGS: Mediastinum: The heart size appears normal. No significant Coronary artery calcifications identified. There is calcified plaque within the aortic arch. There is calcified atherosclerotic disease involving the thoracic aorta. The trachea appears patent and is midline. Calcified right hilar lymph nodes are identified. No mediastinal adenopathy identified.  Lungs/Pleura: Small left pleural effusion with mild overlying consolidation identified in the left lower lobe. Suspect moderate volume right pleural effusion with airspace consolidation involving the right lower lobe. There may be a centrally located lesion within the right lower lobe measuring mm 3 cm, image 32/series 2. This is difficult to assess due to lack of IV contrast material.  Upper Abdomen: Incidental imaging within the upper abdomen shows no acute findings. The visualized portions of the liver are normal. There are calcified granulomas within the spleen. The adrenal glands are unremarkable. Review of the visualized osseous structures is on unremarkable. No aggressive lytic or sclerotic bone lesions identified.  Musculoskeletal: No aggressive lytic or sclerotic bone lesions.  IMPRESSION: 1.  Moderate volume right pleural effusion with right lower lobe airspace consolidation. The right lower lobe is difficult to assess due to lack of IV contrast material. If there are no signs or symptoms of infection then consider diagnostic and therapeutic right thoracentesis and (if the patient can tolerate IV contrast material) a repeat, contrast enhanced CT of the chest to assess for underlying chest lesion. 2. Smaller left pleural effusion with overlying compressive type atelectasis and consolidation. 3. Prior granulomatous disease   Electronically Signed   By: Kerby Moors M.D.   On: 07/30/2014 11:45        ASSESSMENT / PLAN:  PULMONARY A: Acute Hypoxic, hypercarbic Resp Failure - secondary to CHF and volume overload.  Suspect R  heart failure and secondary PAH are largest issue here ?? RML CAP vs atx and collapse; clinical course inconsistent w infection Mild COPD - reportedly has had PFT and was told "her lungs were fine".  I dont have access to PFT at this time but suspect that she has mixed disease.  Tobacco Abuse - ongoing, hx up to 3 ppd, currently 1/2 ppd   - 07/31/2014 - significantly better. CT c/w findings of diastolic dysfn heart failure - acute on chronic and compressive atelectasis. Still with effusion (moderate) suggesting more room for diuresies.   P:   No ACE inhibitor due to wheeze  Hold off thora - having good effect with diuresis but reconsider if stilll very hypoxemic by 08/04/14 Intermittent lasix from 07/29/14 - continue aggressive diuresis as long as bp/renal functio perme Solu-medrol  Increased by triad 07/28/14 - reduce 07/31/2014; TRH To START PRED 08/01/14 Continue  scheduled duoneb q4h (aim for spiriva prior to dc) Continue Albuterol, Singular,  Chantix  Mobilize Consider LTAC/ INpatient rehab  Amulatory assessment of O2 needs prior to discharge Will need outpatient sleep study  Smoking cessation  needed Monitor off abx   CARDIOVASCULAR A: Probable PAH with  cor pulmonale Possible new dx CHF, unclear diastolic or systolic or both at this time   P:   Per cards and triad  RENAL A: Nil acute  P:   replete Monitor I's and O's Trend BMP with lasix gtt  GASTROINTESTINAL A: Morbid Obesity  P:   Fluid restricted diet  HEMATOLOGIC A: No acute issues P:   Monitor CBC   INFECTIOUS A: Possible RML CAP. Off abx. PCT negative P:   follow clinically.     NEUROLOGIC A: Bipolar Disorder   - normal 07/31/2014  P:   Per Primary SVC   GLOBAL 07/31/2014 : husband and wife/patient  updated. Aim to mobilize. Consider LTAC; might benefit due to medical needs and LTAC   PCCM will re round 08/03/14 monday   Dr. Brand Males, M.D., F.C.C.P Pulmonary and Critical Care Medicine Staff Physician [facility] Pager: [phone], If no answer or between  15:00h - 7:00h: call [phone]  07/31/2014 9:29 AM

## 2014-07-31 NOTE — Progress Notes (Signed)
Pt's PCP is Dr. [name] [phone]. A call to Dr. Hulen Shouts office revealed that the pt has not been there over a year.  Encouraged pt to keep this appointment on 08/12/14 at 11:00 AM with Dr. [name].

## 2014-07-31 NOTE — Progress Notes (Signed)
CRITICAL VALUE ALERT  Critical value received: CO2 48  Date of notification:  07/31/2014  Time of notification:  0530  Critical value read back:Yes  Nurse who received alert:  K.Ancil Boozer  MD notified (1st page):   Time of first page:   MD notified (2nd page)  Time of second page:  Responding MD:   Time MD responded: Value consistent with previous results. Will continue to monitor patient.

## 2014-07-31 NOTE — Progress Notes (Signed)
[facility] PROGRESS NOTE  Deborah [name] ZOX:[id number] DOB: 1956/04/26 DOA: 07/25/2014 PCP: No primary care provider on file.  Summary/Subjective Appreciate cardiology/pulmonary help. Deborah [name] is a pleasant 59 y/o female smoker with COPD, essential Htn, Morbid obesity, Body mass index is 45.12 kg/(m^2)., bipolar disorder, who came to the emergency room with complaints of feeling poorly for the past 6 months followed by worsening short of breath on exertion as well as increasing lower extremity edema. She is being treated for "hypercarbic, hypoxic resp failure likely due to volume overload from previously undx CHF". Her CXR showed "1. Cardiomegaly. 2. Bibasilar consolidation, atelectasis versus pneumonitis. 3. Moderate left pleural effusion.". 2D Echo showed EF 50-55% with grade 1 diastolic dysfunction with suggestion of Corpulmonale. She had cardiac cath which showed?. Ct chest sows "1. Moderate volume right pleural effusion with right lower lobe airspace consolidation. The right lower lobe is difficult to assess due to lack of IV contrast material. If there are no signs or symptoms of infection then consider diagnostic and therapeutic right thoracentesis and (if the patient can tolerate IV contrast material) a repeat, contrast enhanced CT of the chest to assess for underlying chest lesion. 2. Smaller left pleural effusion with overlying compressive type atelectasis and consolidation. 3. Prior granulomatous disease". She reports leg swelling in the area today. She says her breathing is better. She was deemed unsuitable for inpatient rehabilitation. We'll likely discharge home with home health services in the next week. Acute respiratory failure with hypoxia and hypercarbia due to Acute diastolic HF/COPD exacerbation:  - Continue management per pulmonary/cardiology. - Continue Lasix dose of 40 mg twice daily. - Monitor electrolytes. We'll replenish potassium as necessary  Essential  Hypertension - Her blood pressure is stable  CRLD (chronic restrictive lung disease) secondary to super morbid obesity with mild COPD component - Continue medications per pulmonary  Tobacco abuse disorder: - Counseling.  Bipolar 1 disorder: - On no medications at home.  Code Status: Full Code. Family Communication: Called husband [name] at [phone], and left message. Disposition Plan: Eventually home.   Consultants:  Pulmonary/Cardiology/Rehab.  Procedures:  Cardiac Cath  Antibiotics: None at present. Objective: Filed Vitals:   07/31/14 2039  BP: 130/72  Pulse: 101  Temp: 97.5 F (36.4 C)  Resp: 18    Intake/Output Summary (Last 24 hours) at 07/31/14 2153 Last data filed at 07/31/14 1700  Gross per 24 hour  Intake    483 ml  Output   3550 ml  Net  -3067 ml   Filed Weights   07/29/14 0641 07/30/14 0553 07/31/14 0628  Weight: 116.03 kg (255 lb 12.8 oz) 116.212 kg (256 lb 3.2 oz) 112.628 kg (248 lb 4.8 oz)    Exam:   General:  Sitting up in chair not in distress.  Cardiovascular: First and second heart sounds had 2 normal MRIs.  Respiratory: Good air entry bilaterally no wheezing.  Abdomen: Soft and nontender.  Musculoskeletal: Some pedal edema.   Data Reviewed: Basic Metabolic Panel:  Recent Labs Lab 07/27/14 0330 07/28/14 0504 07/29/14 0357 07/30/14 0423 07/31/14 0416  NA 138 141 141 139 142  K 4.0 3.5 3.6 3.3* 3.2*  CL 94* 89* 85* 80* 84*  CO2 32 44* 46* 48* 48*  GLUCOSE 53* 111* 125* 126* 115*  BUN 13 13 14 12 12   CREATININE 0.70 0.63 0.65 0.62 0.50  CALCIUM 8.2* 8.5 8.5 8.6 9.0  MG  --   --   --  1.9 2.2  PHOS  --   --   --  4.2  --    Liver Function Tests:  Recent Labs Lab 07/25/14 1534 07/25/14 1801 07/26/14 0425 07/30/14 0423 07/31/14 0416  AST 36 33 31 47* 81*  ALT 30 31 28  36* 84*  ALKPHOS 85 80 72 55 56  BILITOT 0.6 0.4 0.4 1.3* 1.7*  PROT 7.1 6.9 6.6 6.6 6.2  ALBUMIN 3.0* 2.9* 2.8* 3.2* 3.1*   No results  for input(s): LIPASE, AMYLASE in the last 168 hours. No results for input(s): AMMONIA in the last 168 hours. CBC:  Recent Labs Lab 07/25/14 1534 07/26/14 0425 07/28/14 0504 07/30/14 0423 07/31/14 0416  WBC 7.5 6.9 7.3 8.5 9.0  NEUTROABS 5.1  --   --  7.8*  --   HGB 14.7 15.1* 14.1 15.0 14.8  HCT 52.6* 52.6* 50.4* 52.9* 52.1*  MCV 88.0 88.6 88.3 88.6 87.7  PLT 208 164 177 168 159   Cardiac Enzymes:  Recent Labs Lab 07/25/14 1534 07/25/14 2130 07/26/14 0425  TROPONINI <0.03 <0.03 <0.03   BNP (last 3 results) No results for input(s): PROBNP in the last 8760 hours. CBG: No results for input(s): GLUCAP in the last 168 hours.  Recent Results (from the past 240 hour(s))  Culture, blood (routine x 2)     Status: None   Collection Time: 07/25/14  5:14 PM  Result Value Ref Range Status   Specimen Description BLOOD LEFT ANTECUBITAL  Final   Special Requests BOTTLES DRAWN AEROBIC AND ANAEROBIC 5 CC EACH  Final   Culture   Final    NO GROWTH 5 DAYS Performed at [facility]    Report Status 07/31/2014 FINAL  Final  Culture, blood (routine x 2)     Status: None   Collection Time: 07/25/14  5:14 PM  Result Value Ref Range Status   Specimen Description BLOOD RIGHT ANTECUBITAL  Final   Special Requests BOTTLES DRAWN AEROBIC AND ANAEROBIC 5 CC EACH  Final   Culture   Final    NO GROWTH 5 DAYS Performed at [facility]    Report Status 07/31/2014 FINAL  Final  MRSA PCR Screening     Status: None   Collection Time: 07/25/14  6:33 PM  Result Value Ref Range Status   MRSA by PCR NEGATIVE NEGATIVE Final    Comment:        The GeneXpert MRSA Assay (FDA approved for NASAL specimens only), is one component of a comprehensive MRSA colonization surveillance program. It is not intended to diagnose MRSA infection nor to guide or monitor treatment for MRSA infections.   Culture, expectorated sputum-assessment     Status: None   Collection Time: 07/29/14  1:52 PM   Result Value Ref Range Status   Specimen Description SPUTUM  Final   Special Requests Normal  Final   Sputum evaluation   Final    THIS SPECIMEN IS ACCEPTABLE. RESPIRATORY CULTURE REPORT TO FOLLOW.   Report Status 07/29/2014 FINAL  Final  Culture, respiratory (NON-Expectorated)     Status: None (Preliminary result)   Collection Time: 07/29/14  1:52 PM  Result Value Ref Range Status   Specimen Description SPUTUM  Final   Special Requests NONE  Final   Gram Stain   Final    RARE WBC PRESENT, PREDOMINANTLY MONONUCLEAR RARE SQUAMOUS EPITHELIAL CELLS PRESENT FEW GRAM POSITIVE COCCI IN PAIRS IN CLUSTERS Performed at [facility]    Culture   Final    Culture reincubated for better growth Performed at [facility]  Report Status PENDING  Incomplete     Studies: Ct Chest Wo Contrast  07/30/2014   CLINICAL DATA:  Shortness of breath for 6 to 8 weeks. Smoker. History of COPD and hypertension.  EXAM: CT CHEST WITHOUT CONTRAST  TECHNIQUE: Multidetector CT imaging of the chest was performed following the standard protocol without IV contrast.  COMPARISON:  Chest radiograph 07/28/2016  FINDINGS: Mediastinum: The heart size appears normal. No significant Coronary artery calcifications identified. There is calcified plaque within the aortic arch. There is calcified atherosclerotic disease involving the thoracic aorta. The trachea appears patent and is midline. Calcified right hilar lymph nodes are identified. No mediastinal adenopathy identified.  Lungs/Pleura: Small left pleural effusion with mild overlying consolidation identified in the left lower lobe. Suspect moderate volume right pleural effusion with airspace consolidation involving the right lower lobe. There may be a centrally located lesion within the right lower lobe measuring mm 3 cm, image 32/series 2. This is difficult to assess due to lack of IV contrast material.  Upper Abdomen: Incidental imaging within the upper  abdomen shows no acute findings. The visualized portions of the liver are normal. There are calcified granulomas within the spleen. The adrenal glands are unremarkable. Review of the visualized osseous structures is on unremarkable. No aggressive lytic or sclerotic bone lesions identified.  Musculoskeletal: No aggressive lytic or sclerotic bone lesions.  IMPRESSION: 1. Moderate volume right pleural effusion with right lower lobe airspace consolidation. The right lower lobe is difficult to assess due to lack of IV contrast material. If there are no signs or symptoms of infection then consider diagnostic and therapeutic right thoracentesis and (if the patient can tolerate IV contrast material) a repeat, contrast enhanced CT of the chest to assess for underlying chest lesion. 2. Smaller left pleural effusion with overlying compressive type atelectasis and consolidation. 3. Prior granulomatous disease   Electronically Signed   By: Kerby Moors M.D.   On: 07/30/2014 11:45    Scheduled Meds: . antiseptic oral rinse  7 mL Mouth Rinse q12n4p  . aspirin  81 mg Oral Daily  . chlorhexidine  15 mL Mouth Rinse BID  . furosemide  40 mg Oral Daily  . heparin  5,000 Units Subcutaneous 3 times per day  . ipratropium-albuterol  3 mL Nebulization Q4H  . losartan  50 mg Oral Daily  . methylPREDNISolone (SOLU-MEDROL) injection  60 mg Intravenous Q12H  . metoprolol tartrate  12.5 mg Oral BID  . montelukast  10 mg Oral QHS  . nicotine  14 mg Transdermal Daily  . sodium chloride  3 mL Intravenous Q12H  . varenicline  1 mg Oral Daily   Continuous Infusions:   Principal Problem:   Acute respiratory failure with hypoxia and hypercarbia Active Problems:   Shortness of breath   CHF, acute on chronic, unknown EF   Hypertension   Hyperkalemia   CRLD (chronic restrictive lung disease) secondary to super morbid obesity with mild COPD component   Tobacco abuse disorder   Bipolar 1 disorder   Acute respiratory  acidosis   Pulmonary HTN   Acute diastolic heart failure   COPD exacerbation    Time spent: 15 minutes    Deborah [name]  [facility] Pager [phone]. If 7PM-7AM, please contact night-coverage at [email or web address], password [facility] 07/31/2014, 9:53 PM  LOS: 6 days

## 2014-07-31 NOTE — Progress Notes (Signed)
Pt states she will call when she is ready to be placed on Bipap. RT will return at that time.

## 2014-07-31 NOTE — Progress Notes (Signed)
Rehab admissions - Evaluated for possible admission.  Patient is already doing too well to meet criteria for acute inpatient rehab admission.  Recommend home with [facility] or could pursue SNF if needed.  I will sign off for acute inpatient rehab admissions.  Call me for questions.  #[phone]

## 2014-08-01 LAB — CULTURE, RESPIRATORY: Culture: NORMAL

## 2014-08-01 LAB — BASIC METABOLIC PANEL
Anion gap: 6 (ref 5–15)
BUN: 15 mg/dL (ref 6–23)
CO2: 45 mmol/L (ref 19–32)
Calcium: 8.7 mg/dL (ref 8.4–10.5)
Chloride: 86 mEq/L — ABNORMAL LOW (ref 96–112)
Creatinine, Ser: 0.44 mg/dL — ABNORMAL LOW (ref 0.50–1.10)
GFR calc Af Amer: >90 mL/min (ref >90)
GFR calc non Af Amer: >90 mL/min (ref >90)
Glucose, Bld: 99 mg/dL (ref 70–99)
Potassium: 3.2 mmol/L — ABNORMAL LOW (ref 3.5–5.1)
Sodium: 137 mmol/L (ref 135–145)

## 2014-08-01 LAB — MAGNESIUM: Magnesium: 2.1 mg/dL (ref 1.5–2.5)

## 2014-08-01 LAB — CULTURE, RESPIRATORY W GRAM STAIN

## 2014-08-01 MED ORDER — POTASSIUM CHLORIDE 20 MEQ/15ML (10%) PO SOLN
40.0000 meq | Freq: Once | ORAL | Status: AC
  Administered 2014-08-01: 40 meq via ORAL
  Filled 2014-08-01: qty 30

## 2014-08-01 MED ORDER — TRAMADOL HCL 50 MG PO TABS
50.0000 mg | ORAL_TABLET | Freq: Four times a day (QID) | ORAL | Status: DC | PRN
  Administered 2014-08-01: 50 mg via ORAL
  Filled 2014-08-01: qty 1

## 2014-08-01 NOTE — Progress Notes (Signed)
[facility] PROGRESS NOTE  Deborah [name] TXM:[id number] DOB: 1956-01-12 DOA: 07/25/2014 PCP: No primary care provider on file.  Summary/Subjective Appreciate cardiology/pulmonary help. Deborah [name] is a pleasant 59 y/o female smoker with COPD, essential Htn, Morbid obesity, Body mass index is 45.12 kg/(m^2)., bipolar disorder, who came to the emergency room with complaints of feeling poorly for the past 6 months followed by worsening short of breath on exertion as well as increasing lower extremity edema. She is being treated for "hypercarbic, hypoxic resp failure likely due to volume overload from previously undx CHF". Her CXR showed "1. Cardiomegaly. 2. Bibasilar consolidation, atelectasis versus pneumonitis. 3. Moderate left pleural effusion.". 2D Echo showed EF 50-55% with grade 1 diastolic dysfunction with suggestion of Corpulmonale. She had cardiac cath which showed?. Ct chest sows "1. Moderate volume right pleural effusion with right lower lobe airspace consolidation. The right lower lobe is difficult to assess due to lack of IV contrast material. If there are no signs or symptoms of infection then consider diagnostic and therapeutic right thoracentesis and (if the patient can tolerate IV contrast material) a repeat, contrast enhanced CT of the chest to assess for underlying chest lesion. 2. Smaller left pleural effusion with overlying compressive type atelectasis and consolidation. 3. Prior granulomatous disease". She reports that the leg swelling is better. Plan Acute respiratory failure with hypoxia and hypercarbia due to Acute diastolic HF/COPD exacerbation:  - Continue management per pulmonary/cardiology. - Continue Lasix dose of 40 mg twice daily. - Monitor electrolytes. Replenish potassium as necessary  Essential Hypertension - Her blood pressure is stable  CRLD (chronic restrictive lung disease) secondary to super morbid obesity with mild COPD component - Continue  medications per pulmonary  Tobacco abuse disorder: - Counseling.  Bipolar 1 disorder: - On no medications at home.  Code Status: Full Code. Family Communication: Called husband [name] at [phone], and left message. Disposition Plan: Eventually home.   Consultants:  Pulmonary/Cardiology/Rehab.  Procedures:  Cardiac Cath  Antibiotics: None at present. Objective: Filed Vitals:   08/01/14 1415  BP: 130/80  Pulse: 89  Temp: 98.1 F (36.7 C)  Resp: 20    Intake/Output Summary (Last 24 hours) at 08/01/14 1800 Last data filed at 08/01/14 1300  Gross per 24 hour  Intake    420 ml  Output   1775 ml  Net  -1355 ml   Filed Weights   07/29/14 0641 07/30/14 0553 07/31/14 0628  Weight: 116.03 kg (255 lb 12.8 oz) 116.212 kg (256 lb 3.2 oz) 112.628 kg (248 lb 4.8 oz)    Exam:   General:  Sitting up, not in distress.  Cardiovascular: RRR. No murmurs. S1S2 Normal.  Respiratory: Good air entry bilaterally. No rhonchi or rales.  Abdomen: Soft and nontender. Normal bowel sounds.  Musculoskeletal: Small amount of pedal edema.   Data Reviewed: Basic Metabolic Panel:  Recent Labs Lab 07/28/14 0504 07/29/14 0357 07/30/14 0423 07/31/14 0416 08/01/14 0441  NA 141 141 139 142 137  K 3.5 3.6 3.3* 3.2* 3.2*  CL 89* 85* 80* 84* 86*  CO2 44* 46* 48* 48* 45*  GLUCOSE 111* 125* 126* 115* 99  BUN 13 14 12 12 15   CREATININE 0.63 0.65 0.62 0.50 0.44*  CALCIUM 8.5 8.5 8.6 9.0 8.7  MG  --   --  1.9 2.2 2.1  PHOS  --   --  4.2  --   --    Liver Function Tests:  Recent Labs Lab 07/25/14 1801 07/26/14 0425 07/30/14 0423 07/31/14  0416  AST 33 31 47* 81*  ALT 31 28 36* 84*  ALKPHOS 80 72 55 56  BILITOT 0.4 0.4 1.3* 1.7*  PROT 6.9 6.6 6.6 6.2  ALBUMIN 2.9* 2.8* 3.2* 3.1*   No results for input(s): LIPASE, AMYLASE in the last 168 hours. No results for input(s): AMMONIA in the last 168 hours. CBC:  Recent Labs Lab 07/26/14 0425 07/28/14 0504 07/30/14 0423  07/31/14 0416  WBC 6.9 7.3 8.5 9.0  NEUTROABS  --   --  7.8*  --   HGB 15.1* 14.1 15.0 14.8  HCT 52.6* 50.4* 52.9* 52.1*  MCV 88.6 88.3 88.6 87.7  PLT 164 177 168 159   Cardiac Enzymes:  Recent Labs Lab 07/25/14 2130 07/26/14 0425  TROPONINI <0.03 <0.03   BNP (last 3 results) No results for input(s): PROBNP in the last 8760 hours. CBG: No results for input(s): GLUCAP in the last 168 hours.  Recent Results (from the past 240 hour(s))  Culture, blood (routine x 2)     Status: None   Collection Time: 07/25/14  5:14 PM  Result Value Ref Range Status   Specimen Description BLOOD LEFT ANTECUBITAL  Final   Special Requests BOTTLES DRAWN AEROBIC AND ANAEROBIC 5 CC EACH  Final   Culture   Final    NO GROWTH 5 DAYS Performed at [facility]    Report Status 07/31/2014 FINAL  Final  Culture, blood (routine x 2)     Status: None   Collection Time: 07/25/14  5:14 PM  Result Value Ref Range Status   Specimen Description BLOOD RIGHT ANTECUBITAL  Final   Special Requests BOTTLES DRAWN AEROBIC AND ANAEROBIC 5 CC EACH  Final   Culture   Final    NO GROWTH 5 DAYS Performed at [facility]    Report Status 07/31/2014 FINAL  Final  MRSA PCR Screening     Status: None   Collection Time: 07/25/14  6:33 PM  Result Value Ref Range Status   MRSA by PCR NEGATIVE NEGATIVE Final    Comment:        The GeneXpert MRSA Assay (FDA approved for NASAL specimens only), is one component of a comprehensive MRSA colonization surveillance program. It is not intended to diagnose MRSA infection nor to guide or monitor treatment for MRSA infections.   Culture, expectorated sputum-assessment     Status: None   Collection Time: 07/29/14  1:52 PM  Result Value Ref Range Status   Specimen Description SPUTUM  Final   Special Requests Normal  Final   Sputum evaluation   Final    THIS SPECIMEN IS ACCEPTABLE. RESPIRATORY CULTURE REPORT TO FOLLOW.   Report Status 07/29/2014 FINAL  Final   Culture, respiratory (NON-Expectorated)     Status: None   Collection Time: 07/29/14  1:52 PM  Result Value Ref Range Status   Specimen Description SPUTUM  Final   Special Requests NONE  Final   Gram Stain   Final    RARE WBC PRESENT, PREDOMINANTLY MONONUCLEAR RARE SQUAMOUS EPITHELIAL CELLS PRESENT FEW GRAM POSITIVE COCCI IN PAIRS IN CLUSTERS Performed at [facility]    Culture   Final    NORMAL OROPHARYNGEAL FLORA Performed at [facility]    Report Status 08/01/2014 FINAL  Final     Studies: No results found.  Scheduled Meds: . antiseptic oral rinse  7 mL Mouth Rinse q12n4p  . aspirin  81 mg Oral Daily  . chlorhexidine  15 mL Mouth  Rinse BID  . furosemide  40 mg Oral BID  . heparin  5,000 Units Subcutaneous 3 times per day  . ipratropium-albuterol  3 mL Nebulization Q4H  . losartan  50 mg Oral Daily  . methylPREDNISolone (SOLU-MEDROL) injection  60 mg Intravenous Q12H  . metoprolol tartrate  12.5 mg Oral BID  . montelukast  10 mg Oral QHS  . nicotine  14 mg Transdermal Daily  . potassium chloride  40 mEq Oral Once  . sodium chloride  3 mL Intravenous Q12H  . varenicline  1 mg Oral Daily   Continuous Infusions:   Principal Problem:   Acute respiratory failure with hypoxia and hypercarbia Active Problems:   Shortness of breath   CHF, acute on chronic, unknown EF   Hypertension   Hyperkalemia   CRLD (chronic restrictive lung disease) secondary to super morbid obesity with mild COPD component   Tobacco abuse disorder   Bipolar 1 disorder   Acute respiratory acidosis   Pulmonary HTN   Acute diastolic heart failure   COPD exacerbation    Time spent: 15 minutes.    Kamariah Fruchter  [facility] Pager [phone]. If 7PM-7AM, please contact night-coverage at [email or web address], password [facility] 08/01/2014, 6:00 PM  LOS: 7 days

## 2014-08-01 NOTE — Progress Notes (Signed)
RT placed pt on auto titrate Bipap min EPAP-5cmH2O max IPAP-20cmH2O with 6L of oxygen bled in via ffm. Sterile water is in the humidifier. Pt is resting comfortably and tolerating Bipap well at this time. RT will continue to monitor as needed.

## 2014-08-01 NOTE — Progress Notes (Signed)
CRITICAL VALUE ALERT  Critical value received:  CO2 45  Date of notification: 08/01/14  Time of notification:  0530  Critical value read back:yes  Nurse who received alert:  Dellie Catholic  MD notified (1st page):  Yes  Time of first MGNO:0370

## 2014-08-02 LAB — BASIC METABOLIC PANEL
Anion gap: 12 (ref 5–15)
BUN: 18 mg/dL (ref 6–23)
CO2: 41 mmol/L (ref 19–32)
Calcium: 8.9 mg/dL (ref 8.4–10.5)
Chloride: 87 mEq/L — ABNORMAL LOW (ref 96–112)
Creatinine, Ser: 0.53 mg/dL (ref 0.50–1.10)
GFR calc Af Amer: >90 mL/min (ref >90)
GFR calc non Af Amer: >90 mL/min (ref >90)
Glucose, Bld: 110 mg/dL — ABNORMAL HIGH (ref 70–99)
Potassium: 4.5 mmol/L (ref 3.5–5.1)
Sodium: 140 mmol/L (ref 135–145)

## 2014-08-02 NOTE — Progress Notes (Signed)
[facility] PROGRESS NOTE  DAN DISSINGER ENI:[id number] DOB: 08-05-1955 DOA: 07/25/2014 PCP: No primary care provider on file.  Summary/Subjective Appreciate cardiology/pulmonary help. Deborah [name] is a pleasant 59 y/o female smoker with COPD, essential Htn, Morbid obesity, Body mass index is 45.12 kg/(m^2)., bipolar disorder, who came to the emergency room with complaints of feeling poorly for the past 6 months followed by worsening short of breath on exertion as well as increasing lower extremity edema. She is being treated for "hypercarbic, hypoxic resp failure likely due to volume overload from previously undx CHF". Her CXR showed "1. Cardiomegaly. 2. Bibasilar consolidation, atelectasis versus pneumonitis. 3. Moderate left pleural effusion.". 2D Echo showed EF 50-55% with grade 1 diastolic dysfunction with suggestion of Corpulmonale. She had cardiac cath which showed?. Ct chest sows "1. Moderate volume right pleural effusion with right lower lobe airspace consolidation. The right lower lobe is difficult to assess due to lack of IV contrast material. If there are no signs or symptoms of infection then consider diagnostic and therapeutic right thoracentesis and (if the patient can tolerate IV contrast material) a repeat, contrast enhanced CT of the chest to assess for underlying chest lesion. 2. Smaller left pleural effusion with overlying compressive type atelectasis and consolidation. 3. Prior granulomatous disease". She denies shortness of breath. She continues to diurese. However she is still requiring 6 L oxygen. Will continue current management. Hopefully home early this week. Plan Acute respiratory failure with hypoxia and hypercarbia due to Acute diastolic HF/COPD exacerbation:  - Continue management per pulmonary/cardiology. - Continue Lasix dose of 40 mg twice daily. - Monitor electrolytes. Replenish potassium as necessary  Essential Hypertension - Her blood pressure is  stable  CRLD (chronic restrictive lung disease) secondary to super morbid obesity with mild COPD component - Continue medications per pulmonary  Tobacco abuse disorder: - Counseling.  Bipolar 1 disorder: - On no medications at home.  Code Status: Full Code. Family Communication: Called husband Deborah [name] at [phone], and left message. Disposition Plan: Eventually home.   Consultants:  Pulmonary/Cardiology/Rehab.  Procedures:  Cardiac Cath  Antibiotics: None at present. Objective: Filed Vitals:   08/02/14 1420  BP: 139/75  Pulse: 93  Temp: 97.9 F (36.6 C)  Resp: 18    Intake/Output Summary (Last 24 hours) at 08/02/14 1819 Last data filed at 08/02/14 1814  Gross per 24 hour  Intake   1560 ml  Output   5950 ml  Net  -4390 ml   Filed Weights   07/30/14 0553 07/31/14 0628 08/02/14 0648  Weight: 116.212 kg (256 lb 3.2 oz) 112.628 kg (248 lb 4.8 oz) 108.954 kg (240 lb 3.2 oz)    Exam:   General:  Sitting up in chair not in distress.  Cardiovascular: First and second heart sounds heard. No members.  Respiratory: Lungs clear.  Abdomen: Soft and nontender.  Musculoskeletal: No pedal edema.   Data Reviewed: Basic Metabolic Panel:  Recent Labs Lab 07/29/14 0357 07/30/14 0423 07/31/14 0416 08/01/14 0441 08/02/14 0500  NA 141 139 142 137 140  K 3.6 3.3* 3.2* 3.2* 4.5  CL 85* 80* 84* 86* 87*  CO2 46* 48* 48* 45* 41*  GLUCOSE 125* 126* 115* 99 110*  BUN 14 12 12 15 18   CREATININE 0.65 0.62 0.50 0.44* 0.53  CALCIUM 8.5 8.6 9.0 8.7 8.9  MG  --  1.9 2.2 2.1  --   PHOS  --  4.2  --   --   --    Liver Function Tests:  Recent Labs Lab 07/30/14 0423 07/31/14 0416  AST 47* 81*  ALT 36* 84*  ALKPHOS 55 56  BILITOT 1.3* 1.7*  PROT 6.6 6.2  ALBUMIN 3.2* 3.1*   No results for input(s): LIPASE, AMYLASE in the last 168 hours. No results for input(s): AMMONIA in the last 168 hours. CBC:  Recent Labs Lab 07/28/14 0504 07/30/14 0423  07/31/14 0416  WBC 7.3 8.5 9.0  NEUTROABS  --  7.8*  --   HGB 14.1 15.0 14.8  HCT 50.4* 52.9* 52.1*  MCV 88.3 88.6 87.7  PLT 177 168 159   Cardiac Enzymes: No results for input(s): CKTOTAL, CKMB, CKMBINDEX, TROPONINI in the last 168 hours. BNP (last 3 results) No results for input(s): PROBNP in the last 8760 hours. CBG: No results for input(s): GLUCAP in the last 168 hours.  Recent Results (from the past 240 hour(s))  Culture, blood (routine x 2)     Status: None   Collection Time: 07/25/14  5:14 PM  Result Value Ref Range Status   Specimen Description BLOOD LEFT ANTECUBITAL  Final   Special Requests BOTTLES DRAWN AEROBIC AND ANAEROBIC 5 CC EACH  Final   Culture   Final    NO GROWTH 5 DAYS Performed at [facility]    Report Status 07/31/2014 FINAL  Final  Culture, blood (routine x 2)     Status: None   Collection Time: 07/25/14  5:14 PM  Result Value Ref Range Status   Specimen Description BLOOD RIGHT ANTECUBITAL  Final   Special Requests BOTTLES DRAWN AEROBIC AND ANAEROBIC 5 CC EACH  Final   Culture   Final    NO GROWTH 5 DAYS Performed at [facility]    Report Status 07/31/2014 FINAL  Final  MRSA PCR Screening     Status: None   Collection Time: 07/25/14  6:33 PM  Result Value Ref Range Status   MRSA by PCR NEGATIVE NEGATIVE Final    Comment:        The GeneXpert MRSA Assay (FDA approved for NASAL specimens only), is one component of a comprehensive MRSA colonization surveillance program. It is not intended to diagnose MRSA infection nor to guide or monitor treatment for MRSA infections.   Culture, expectorated sputum-assessment     Status: None   Collection Time: 07/29/14  1:52 PM  Result Value Ref Range Status   Specimen Description SPUTUM  Final   Special Requests Normal  Final   Sputum evaluation   Final    THIS SPECIMEN IS ACCEPTABLE. RESPIRATORY CULTURE REPORT TO FOLLOW.   Report Status 07/29/2014 FINAL  Final  Culture, respiratory  (NON-Expectorated)     Status: None   Collection Time: 07/29/14  1:52 PM  Result Value Ref Range Status   Specimen Description SPUTUM  Final   Special Requests NONE  Final   Gram Stain   Final    RARE WBC PRESENT, PREDOMINANTLY MONONUCLEAR RARE SQUAMOUS EPITHELIAL CELLS PRESENT FEW GRAM POSITIVE COCCI IN PAIRS IN CLUSTERS Performed at [facility]    Culture   Final    NORMAL OROPHARYNGEAL FLORA Performed at [facility]    Report Status 08/01/2014 FINAL  Final     Studies: No results found.  Scheduled Meds: . antiseptic oral rinse  7 mL Mouth Rinse q12n4p  . aspirin  81 mg Oral Daily  . chlorhexidine  15 mL Mouth Rinse BID  . furosemide  40 mg Oral BID  . heparin  5,000 Units Subcutaneous  3 times per day  . ipratropium-albuterol  3 mL Nebulization Q4H  . losartan  50 mg Oral Daily  . methylPREDNISolone (SOLU-MEDROL) injection  60 mg Intravenous Q12H  . metoprolol tartrate  12.5 mg Oral BID  . montelukast  10 mg Oral QHS  . nicotine  14 mg Transdermal Daily  . sodium chloride  3 mL Intravenous Q12H  . varenicline  1 mg Oral Daily   Continuous Infusions:   Principal Problem:   Acute respiratory failure with hypoxia and hypercarbia Active Problems:   Shortness of breath   CHF, acute on chronic, unknown EF   Hypertension   Hyperkalemia   CRLD (chronic restrictive lung disease) secondary to super morbid obesity with mild COPD component   Tobacco abuse disorder   Bipolar 1 disorder   Acute respiratory acidosis   Pulmonary HTN   Acute diastolic heart failure   COPD exacerbation    Time spent: 15 minutes.    Camdyn Beske  [facility] Pager [phone]. If 7PM-7AM, please contact night-coverage at [email or web address], password [facility] 08/02/2014, 6:19 PM  LOS: 8 days

## 2014-08-02 NOTE — Progress Notes (Signed)
Lab reports CO2 this AM is down to 41 (was 45 yesterday)

## 2014-08-03 DIAGNOSIS — R0902 Hypoxemia: Secondary | ICD-10-CM | POA: Diagnosis present

## 2014-08-03 LAB — BASIC METABOLIC PANEL
Anion gap: 10 (ref 5–15)
BUN: 20 mg/dL (ref 6–23)
CO2: 41 mmol/L (ref 19–32)
Calcium: 8.8 mg/dL (ref 8.4–10.5)
Chloride: 91 mEq/L — ABNORMAL LOW (ref 96–112)
Creatinine, Ser: 0.44 mg/dL — ABNORMAL LOW (ref 0.50–1.10)
GFR calc Af Amer: >90 mL/min (ref >90)
GFR calc non Af Amer: >90 mL/min (ref >90)
Glucose, Bld: 114 mg/dL — ABNORMAL HIGH (ref 70–99)
Potassium: 3.4 mmol/L — ABNORMAL LOW (ref 3.5–5.1)
Sodium: 142 mmol/L (ref 135–145)

## 2014-08-03 LAB — CBC
HCT: 49.1 % — ABNORMAL HIGH (ref 36.0–46.0)
Hemoglobin: 14.2 g/dL (ref 12.0–15.0)
MCH: 24.5 pg — ABNORMAL LOW (ref 26.0–34.0)
MCHC: 28.9 g/dL — ABNORMAL LOW (ref 30.0–36.0)
MCV: 84.7 fL (ref 78.0–100.0)
Platelets: 160 10*3/uL (ref 150–400)
RBC: 5.8 MIL/uL — ABNORMAL HIGH (ref 3.87–5.11)
RDW: 18.6 % — ABNORMAL HIGH (ref 11.5–15.5)
WBC: 9.1 10*3/uL (ref 4.0–10.5)

## 2014-08-03 MED ORDER — ALBUTEROL SULFATE (2.5 MG/3ML) 0.083% IN NEBU: 2.5000 mg | INHALATION_SOLUTION | RESPIRATORY_TRACT | Status: DC | PRN

## 2014-08-03 MED ORDER — PREDNISONE 20 MG PO TABS
40.0000 mg | ORAL_TABLET | Freq: Every day | ORAL | Status: DC
  Administered 2014-08-03 – 2014-08-04 (×2): 40 mg via ORAL
  Filled 2014-08-03 (×3): qty 2

## 2014-08-03 MED ORDER — TOBRAMYCIN 0.3 % OP SOLN
2.0000 [drp] | Freq: Four times a day (QID) | OPHTHALMIC | Status: DC
  Administered 2014-08-03 – 2014-08-04 (×4): 2 [drp] via OPHTHALMIC
  Filled 2014-08-03: qty 5

## 2014-08-03 MED ORDER — POTASSIUM CHLORIDE 20 MEQ/15ML (10%) PO SOLN
40.0000 meq | Freq: Once | ORAL | Status: AC
  Administered 2014-08-03: 40 meq via ORAL
  Filled 2014-08-03: qty 30

## 2014-08-03 MED ORDER — IPRATROPIUM-ALBUTEROL 0.5-2.5 (3) MG/3ML IN SOLN
3.0000 mL | Freq: Four times a day (QID) | RESPIRATORY_TRACT | Status: DC
  Administered 2014-08-03 – 2014-08-04 (×4): 3 mL via RESPIRATORY_TRACT
  Filled 2014-08-03 (×4): qty 3

## 2014-08-03 MED ORDER — FUROSEMIDE 40 MG PO TABS
40.0000 mg | ORAL_TABLET | Freq: Every day | ORAL | Status: DC
  Administered 2014-08-04: 40 mg via ORAL
  Filled 2014-08-03: qty 1

## 2014-08-03 NOTE — Progress Notes (Signed)
[facility] PROGRESS NOTE  Deborah [name]:[id number] DOB: 08/26/1955 DOA: 07/25/2014 PCP: No primary care provider on file.  Summary/Subjective Appreciate cardiology/pulmonary help. Deborah [name] is a pleasant 59 y/o female smoker with COPD, essential Htn, Morbid obesity, Body mass index is 45.12 kg/(m^2)., bipolar disorder, who came to the emergency room with complaints of feeling poorly for the past 6 months followed by worsening short of breath on exertion as well as increasing lower extremity edema. She is being treated for "hypercarbic, hypoxic resp failure likely due to volume overload from previously undx CHF". Her CXR showed "1. Cardiomegaly. 2. Bibasilar consolidation, atelectasis versus pneumonitis. 3. Moderate left pleural effusion.". 2D Echo showed EF 50-55% with grade 1 diastolic dysfunction with suggestion of Corpulmonale. She had cardiac cath which showed?. Ct chest sows "1. Moderate volume right pleural effusion with right lower lobe airspace consolidation. The right lower lobe is difficult to assess due to lack of IV contrast material. If there are no signs or symptoms of infection then consider diagnostic and therapeutic right thoracentesis and (if the patient can tolerate IV contrast material) a repeat, contrast enhanced CT of the chest to assess for underlying chest lesion. 2. Smaller left pleural effusion with overlying compressive type atelectasis and consolidation. 3. Prior granulomatous disease". She denies shortness of breath but says that she stumbled a little bit while working with occupational therapy. She continues to diurese. She is snow 4 L oxygen. Will decrease Lasix frequency to daily. Will add tobramycin eyedrops for red left eye which patient noticed since yesterday. Hopefully home tomorrow. Plan Acute respiratory failure with hypoxia and hypercarbia due to Acute diastolic HF/COPD exacerbation:  - Continue management per pulmonary/cardiology. - Continue  Lasix with dose of 40 mg once daily. - Monitor electrolytes. Replenish potassium as necessary  Essential Hypertension - Her blood pressure is stable  CRLD (chronic restrictive lung disease) secondary to super morbid obesity with mild COPD component - Continue medications per pulmonary  Tobacco abuse disorder: - Counseling.  Bipolar 1 disorder: - On no medications at home. Conjunctivitis left eye  Tobramycin drops Code Status: Full Code. Family Communication: Spoke with husband Deborah [name] over the phone. Disposition Plan: Eventually home.   Consultants:  Pulmonary/Cardiology/Rehab.  Procedures:  Cardiac Cath  Antibiotics: Tobramycin isolation. HPI/Subjective: Denies shortness of breath. She has not is some redness and discharge of the left eye  Objective: Filed Vitals:   08/03/14 1500  BP: 118/59  Pulse: 95  Temp: 97.8 F (36.6 C)  Resp: 20    Intake/Output Summary (Last 24 hours) at 08/03/14 1833 Last data filed at 08/03/14 1814  Gross per 24 hour  Intake    603 ml  Output   4850 ml  Net  -4247 ml   Filed Weights   07/31/14 0628 08/02/14 0648 08/03/14 0619  Weight: 112.628 kg (248 lb 4.8 oz) 108.954 kg (240 lb 3.2 oz) 105.053 kg (231 lb 9.6 oz)    Exam:   General:  Red left eye  Cardiovascular: First and second heart sounds heard. No murmurs.  Respiratory: Good air entry bilaterally. No rhonchi or rales or wheezes.  Abdomen: Soft and nontender. Normal bowel sounds. No organomegaly.  Musculoskeletal: No pedal edema.   Data Reviewed: Basic Metabolic Panel:  Recent Labs Lab 07/30/14 0423 07/31/14 0416 08/01/14 0441 08/02/14 0500 08/03/14 0425  NA 139 142 137 140 142  K 3.3* 3.2* 3.2* 4.5 3.4*  CL 80* 84* 86* 87* 91*  CO2 48* 48* 45* 41* 41*  GLUCOSE 126*  115* 99 110* 114*  BUN 12 12 15 18 20   CREATININE 0.62 0.50 0.44* 0.53 0.44*  CALCIUM 8.6 9.0 8.7 8.9 8.8  MG 1.9 2.2 2.1  --   --   PHOS 4.2  --   --   --   --    Liver Function  Tests:  Recent Labs Lab 07/30/14 0423 07/31/14 0416  AST 47* 81*  ALT 36* 84*  ALKPHOS 55 56  BILITOT 1.3* 1.7*  PROT 6.6 6.2  ALBUMIN 3.2* 3.1*   No results for input(s): LIPASE, AMYLASE in the last 168 hours. No results for input(s): AMMONIA in the last 168 hours. CBC:  Recent Labs Lab 07/28/14 0504 07/30/14 0423 07/31/14 0416 08/03/14 0425  WBC 7.3 8.5 9.0 9.1  NEUTROABS  --  7.8*  --   --   HGB 14.1 15.0 14.8 14.2  HCT 50.4* 52.9* 52.1* 49.1*  MCV 88.3 88.6 87.7 84.7  PLT 177 168 159 160   Cardiac Enzymes: No results for input(s): CKTOTAL, CKMB, CKMBINDEX, TROPONINI in the last 168 hours. BNP (last 3 results) No results for input(s): PROBNP in the last 8760 hours. CBG: No results for input(s): GLUCAP in the last 168 hours.  Recent Results (from the past 240 hour(s))  Culture, blood (routine x 2)     Status: None   Collection Time: 07/25/14  5:14 PM  Result Value Ref Range Status   Specimen Description BLOOD LEFT ANTECUBITAL  Final   Special Requests BOTTLES DRAWN AEROBIC AND ANAEROBIC 5 CC EACH  Final   Culture   Final    NO GROWTH 5 DAYS Performed at [facility]    Report Status 07/31/2014 FINAL  Final  Culture, blood (routine x 2)     Status: None   Collection Time: 07/25/14  5:14 PM  Result Value Ref Range Status   Specimen Description BLOOD RIGHT ANTECUBITAL  Final   Special Requests BOTTLES DRAWN AEROBIC AND ANAEROBIC 5 CC EACH  Final   Culture   Final    NO GROWTH 5 DAYS Performed at [facility]    Report Status 07/31/2014 FINAL  Final  MRSA PCR Screening     Status: None   Collection Time: 07/25/14  6:33 PM  Result Value Ref Range Status   MRSA by PCR NEGATIVE NEGATIVE Final    Comment:        The GeneXpert MRSA Assay (FDA approved for NASAL specimens only), is one component of a comprehensive MRSA colonization surveillance program. It is not intended to diagnose MRSA infection nor to guide or monitor treatment  for MRSA infections.   Culture, expectorated sputum-assessment     Status: None   Collection Time: 07/29/14  1:52 PM  Result Value Ref Range Status   Specimen Description SPUTUM  Final   Special Requests Normal  Final   Sputum evaluation   Final    THIS SPECIMEN IS ACCEPTABLE. RESPIRATORY CULTURE REPORT TO FOLLOW.   Report Status 07/29/2014 FINAL  Final  Culture, respiratory (NON-Expectorated)     Status: None   Collection Time: 07/29/14  1:52 PM  Result Value Ref Range Status   Specimen Description SPUTUM  Final   Special Requests NONE  Final   Gram Stain   Final    RARE WBC PRESENT, PREDOMINANTLY MONONUCLEAR RARE SQUAMOUS EPITHELIAL CELLS PRESENT FEW GRAM POSITIVE COCCI IN PAIRS IN CLUSTERS Performed at [facility]    Culture   Final    NORMAL OROPHARYNGEAL FLORA  Performed at [facility]    Report Status 08/01/2014 FINAL  Final     Studies: No results found.  Scheduled Meds: . antiseptic oral rinse  7 mL Mouth Rinse q12n4p  . aspirin  81 mg Oral Daily  . chlorhexidine  15 mL Mouth Rinse BID  . [START ON 08/04/2014] furosemide  40 mg Oral Daily  . heparin  5,000 Units Subcutaneous 3 times per day  . ipratropium-albuterol  3 mL Nebulization Q6H  . losartan  50 mg Oral Daily  . metoprolol tartrate  12.5 mg Oral BID  . montelukast  10 mg Oral QHS  . nicotine  14 mg Transdermal Daily  . predniSONE  40 mg Oral Q breakfast  . sodium chloride  3 mL Intravenous Q12H  . tobramycin  2 drop Left Eye 4 times per day  . varenicline  1 mg Oral Daily   Continuous Infusions:   Principal Problem:   Acute respiratory failure with hypoxia and hypercarbia Active Problems:   Shortness of breath   CHF, acute on chronic, unknown EF   Hypertension   Hyperkalemia   CRLD (chronic restrictive lung disease) secondary to super morbid obesity with mild COPD component   Tobacco abuse disorder   Bipolar 1 disorder   Acute respiratory acidosis   Pulmonary HTN   Acute  diastolic heart failure   COPD exacerbation   Hypoxemia    Time spent: 15 minutes.    Darrold Bezek  [facility] Pager [phone]. If 7PM-7AM, please contact night-coverage at [email or web address], password [facility] 08/03/2014, 6:33 PM  LOS: 9 days

## 2014-08-03 NOTE — Progress Notes (Addendum)
PULMONARY  / CRITICAL CARE MEDICINE CONSULTATION   Name: Deborah [name] MRN: [id number] DOB: 14-Oct-1955    ADMISSION DATE:  07/25/2014 CONSULTATION DATE: 07/25/14  REQUESTING CLINICIAN: Dr. Verlon Au PRIMARY SERVICE: [facility]  CHIEF COMPLAINT:  SOB, edema  BRIEF PATIENT DESCRIPTION: 54 yof with PMH of morbid obesity, mild COPD, OSA presents with hypercarbic, hypoxic resp failure due to volume overload from previously undx CHF.   CULTURES: 1/13  Sputum >> neg 1/09  BCx2 >> neg   ANTIBIOTICS: Completed doxy, rocephin & azithro.     SIGNIFICANT EVENTS / STUDIES:  1/09  Admitted, placed on Bipap, CXR with enlarged cardiac silhouette  and ? RLL infiltrate vs atelectasis 1/10  ECHO >> nml LV systolic fxn, dilated hypocontractile RV c/w cor pulmonale 1/11  Tx to med floor.  reports feeling much better, improved swelling, breathing than on admit.   1/12   On face mask 88% pulse ox. On lasix gtt - 9L: negative. VQ negative for PE.  Primary team increased steroids for continued wheezing but noticed on ace inhibitor 1/13  ACE inhibitor changed to ARB and steriods increased yesterday and now wheezing better. But still on 50% FM.  1/14  Says she is better. She walked hallway and per hsuband did well but RN says still on 50% FM and easy desats.  Lasix now intermittent.  Used bipap at night 1/15  Weaned to 6L [place]   SUBJECTIVE:  Pt reports she ambulated in hall and did well with the exception of a mild dizzy spell.     VITAL SIGNS: Temp:  [97 F (36.1 C)-98.3 F (36.8 C)] 97 F (36.1 C) (01/18 0619) Pulse Rate:  [75-95] 81 (01/18 0619) Resp:  [16-20] 20 (01/18 0619) BP: (131-139)/(73-77) 131/77 mmHg (01/18 0619) SpO2:  [91 %-99 %] 94 % (01/18 1142) Weight:  [231 lb 9.6 oz (105.053 kg)] 231 lb 9.6 oz (105.053 kg) (01/18 0619)  INTAKE / OUTPUT: Intake/Output      01/17 0701 - 01/18 0700 01/18 0701 - 01/19 0700   P.O. 840 240   I.V. (mL/kg)  3 (0)   Total Intake(mL/kg) 840 (8) 243 (2.3)   Urine (mL/kg/hr) 5550 (2.2)    Stool 0 (0)    Total Output 5550     Net -4710 +243        Stool Occurrence  1 x     PHYSICAL EXAMINATION: General:  Obese female, up to chair in NAD Neuro:  Alert and Ox3, nonfocal HEENT:  Mm pink/moist, PERRL, short/thick neck Cardiovascular:  s1s2 rrr, distant tones Lungs:  resp's even/non-labored on 4L , improved wheezing Abdomen:  Obese, tolerating BS, +BS Musculoskeletal:  1+ LE pitting edema  LABS:  CBC  Recent Labs Lab 07/30/14 0423 07/31/14 0416 08/03/14 0425  HGB 15.0 14.8 14.2  HCT 52.9* 52.1* 49.1*  WBC 8.5 9.0 9.1  PLT 168 159 160   CHEMISTRY  Recent Labs Lab 07/30/14 0423 07/31/14 0416 08/01/14 0441 08/02/14 0500 08/03/14 0425  NA 139 142 137 140 142  K 3.3* 3.2* 3.2* 4.5 3.4*  CL 80* 84* 86* 87* 91*  CO2 48* 48* 45* 41* 41*  GLUCOSE 126* 115* 99 110* 114*  BUN 12 12 15 18 20   CREATININE 0.62 0.50 0.44* 0.53 0.44*  CALCIUM 8.6 9.0 8.7 8.9 8.8  MG 1.9 2.2 2.1  --   --   PHOS 4.2  --   --   --   --    Estimated Creatinine Clearance: 92.8 mL/min (  by C-G formula based on Cr of 0.44).  IMAGING x48h No results found.    ASSESSMENT / PLAN:  A: Acute Hypoxic / Hypercarbic Respiratory Failure - in the setting of CHF / volume overload.  Concern for R heart failure and secondary PAH as contributing factor for decompensation.   RML Atelectasis - s/p abx, no clear s/s of infection Mild COPD - reportedly has had PFT and was told "her lungs were fine".  No access to PFT at this time but suspect that she has mixed disease.  Tobacco Abuse - ongoing, hx up to 3 ppd, currently 1/2 ppd  P:   No further ACE inhibitor due to wheeze  Lasix as renal function / BP permit  Change to prednisone 40 QD with taper to off Continue Duoneb q6h (aim for spiriva prior to dc) PRN Albuterol Singular Smoking cessation, Chantix  Mobilize, PT Amulatory assessment of O2 needs prior to discharge Will need outpatient sleep study  Pt  will need follow up with Pulmonary upon discharge (arranged with Sleep MD)   A: Probable PAH with Cor Pulmonale CHF with acute decompensation  P:   Per cards and [facility] Will need Cardiology follow up upon discharge.    PCCM will sign off, please call back if new needs arise.    1:34 PM

## 2014-08-03 NOTE — Progress Notes (Signed)
   08/03/14 1012  Clinical Encounter Type  Visited With Patient  Visit Type Spiritual support;Follow-up  Stress Factors  Patient Stress Factors Health changes;Other (Comment)  Chaplain followed up with pt. Pt shares she has received more communication with drs since last visit. Pt says she is feeling much better, and visibly appears to feel much better. She says she can laugh now without choking for the first time in years. Pt shared she feels insecurity about her body weight and so doesn't leave the house much; this has been an emotional issue for her during her stay at hospital. Pt expressed hope and a sense of her own strength at being able to come through this sickness. Pt says she will likely be discharged today or tomorrow.   Mariah Milling, Chaplain 08/03/2014 10:14 AM

## 2014-08-03 NOTE — Progress Notes (Signed)
Physical Therapy Treatment Patient Details Name: ALMETTA LIDDICOAT MRN: [id number] DOB: 03/07/1956 Today's Date: 08/03/2014    History of Present Illness 59 yo female admitted with acute respiratory failure. Hx of COPD, morbid obesity, bipolar d/o, HTN    PT Comments    Pt on 4 lts O2 in bed avg sat 96%.  Assisted OOB to amb sats decreased to 87% so increased O2 to 6 lts to achieve > 90%.  Tolerated amb a great distance however required x 3 rest breaks to "catch my breath".    Follow Up Recommendations  Home health PT (CIR declined "pt doing too well")     Equipment Recommendations  Rolling walker with 5" wheels    Recommendations for Other Services       Precautions / Restrictions Precautions Precautions: Fall Precaution Comments: monitor O2 sats/high O2 requirement Restrictions Weight Bearing Restrictions: No    Mobility  Bed Mobility Overal bed mobility: Needs Assistance Bed Mobility: Supine to Sit     Supine to sit: Supervision;HOB elevated     General bed mobility comments: pt OOB in recliner  Transfers Overall transfer level: Needs assistance Equipment used: Rolling walker (2 wheeled) Transfers: Sit to/from Stand Sit to Stand: Min guard         General transfer comment: close guard for safety and deep breathing  Ambulation/Gait Ambulation/Gait assistance: Min guard Ambulation Distance (Feet): 180 Feet Assistive device: None (occassional use of rail) Gait Pattern/deviations: Step-to pattern;Step-through pattern;Decreased stride length Gait velocity: decreased   General Gait Details: unsteady at time with occassional use of rail.  X 3 standing rest breaks to "catch my breath".  Limited activity tolerance and noted 3/4 DOE.  amb on 6 lts to achieve sats . 90%.   Stairs            Wheelchair Mobility    Modified Rankin (Stroke Patients Only)       Balance                                    Cognition Arousal/Alertness:  Awake/alert Behavior During Therapy: WFL for tasks assessed/performed Overall Cognitive Status: Within Functional Limits for tasks assessed                      Exercises      General Comments        Pertinent Vitals/Pain Pain Assessment: No/denies pain    Home Living                      Prior Function            PT Goals (current goals can now be found in the care plan section)      Frequency  Min 3X/week    PT Plan      Co-evaluation             End of Session Equipment Utilized During Treatment: Gait belt;Oxygen Activity Tolerance: Treatment limited secondary to medical complications (Comment) (limited COPD) Patient left: in chair;with call bell/phone within reach     Time: 0915-0940 PT Time Calculation (min) (ACUTE ONLY): 25 min  Charges:  $Gait Training: 8-22 mins $Therapeutic Activity: 8-22 mins                    G Codes:      Rica Koyanagi  PTA [facility]  Acute  Rehab Pager  [phone]  

## 2014-08-03 NOTE — Progress Notes (Signed)
Occupational Therapy Treatment Patient Details Name: Deborah [name] MRN: [id number] DOB: December 19, 1955 Today's Date: 08/03/2014    History of present illness 59 yo female admitted with acute respiratory failure. Hx of COPD, morbid obesity, bipolar d/o, HTN   OT comments  AE issued as pt felt she would not be able to purchase and she needed  Follow Up Recommendations  SNF;Supervision/Assistance - 24 hour;Home health OT;Other (comment) (depending on progress)    Equipment Recommendations       Recommendations for Other Services      Precautions / Restrictions Precautions Precautions: Fall Precaution Comments: monitor O2 sats/high O2 requirement Restrictions Weight Bearing Restrictions: No       Mobility Bed Mobility               General bed mobility comments: pt OOB in recliner  Transfers       Sit to Stand: Min guard         General transfer comment: close guard for safety and deep breathing    Balance                                   ADL               Lower Body Bathing: Sit to/from stand;With adaptive equipment;Moderate assistance Lower Body Bathing Details (indicate cue type and reason): AE instruction and practice     Lower Body Dressing: Sit to/from stand;Minimal assistance;With adaptive equipment       Toileting- Clothing Manipulation and Hygiene: Sit to/from stand;Minimal assistance         General ADL Comments: increased time needed for pt to to take time to catch breath      Vision                     Perception     Praxis      Cognition   Behavior During Therapy: [facility] for tasks assessed/performed Overall Cognitive Status: Within Functional Limits for tasks assessed                       Extremity/Trunk Assessment               Exercises     Shoulder Instructions       General Comments      Pertinent Vitals/ Pain       Pain Assessment: No/denies pain  Home Living                                          Prior Functioning/Environment              Frequency Min 2X/week     Progress Toward Goals  OT Goals(current goals can now be found in the care plan section)  Progress towards OT goals: Progressing toward goals     Plan Discharge plan remains appropriate    Co-evaluation                 End of Session     Activity Tolerance Patient tolerated treatment well   Patient Left in chair   Nurse Communication Mobility status        Time: 4098-1191 OT Time Calculation (min): 24 min  Charges: OT General Charges $OT Visit: 1 Procedure OT Treatments $Self Care/Home Management :  23-37 mins  Britt Theard, Thereasa Parkin 08/03/2014, 1:00 PM

## 2014-08-04 DIAGNOSIS — J9600 Acute respiratory failure, unspecified whether with hypoxia or hypercapnia: Secondary | ICD-10-CM

## 2014-08-04 LAB — BASIC METABOLIC PANEL
Anion gap: 8 (ref 5–15)
BUN: 20 mg/dL (ref 6–23)
CO2: 39 mmol/L — ABNORMAL HIGH (ref 19–32)
Calcium: 8.7 mg/dL (ref 8.4–10.5)
Chloride: 94 mEq/L — ABNORMAL LOW (ref 96–112)
Creatinine, Ser: 0.45 mg/dL — ABNORMAL LOW (ref 0.50–1.10)
GFR calc Af Amer: >90 mL/min (ref >90)
GFR calc non Af Amer: >90 mL/min (ref >90)
Glucose, Bld: 78 mg/dL (ref 70–99)
Potassium: 3.1 mmol/L — ABNORMAL LOW (ref 3.5–5.1)
Sodium: 141 mmol/L (ref 135–145)

## 2014-08-04 MED ORDER — LOSARTAN POTASSIUM 50 MG PO TABS: 50.0000 mg | ORAL_TABLET | Freq: Every day | ORAL | Status: DC

## 2014-08-04 MED ORDER — PREDNISONE 10 MG PO TABS: ORAL_TABLET | ORAL | Status: DC

## 2014-08-04 MED ORDER — MONTELUKAST SODIUM 10 MG PO TABS: 10.0000 mg | ORAL_TABLET | Freq: Every day | ORAL | Status: DC

## 2014-08-04 MED ORDER — POTASSIUM CHLORIDE 20 MEQ/15ML (10%) PO SOLN
40.0000 meq | Freq: Once | ORAL | Status: AC
  Administered 2014-08-04: 40 meq via ORAL
  Filled 2014-08-04: qty 30

## 2014-08-04 MED ORDER — ALBUTEROL SULFATE (2.5 MG/3ML) 0.083% IN NEBU: 2.5000 mg | INHALATION_SOLUTION | RESPIRATORY_TRACT | Status: DC | PRN

## 2014-08-04 MED ORDER — POTASSIUM CHLORIDE ER 20 MEQ PO TBCR: 10.0000 meq | EXTENDED_RELEASE_TABLET | Freq: Every day | ORAL | Status: DC

## 2014-08-04 MED ORDER — IPRATROPIUM-ALBUTEROL 0.5-2.5 (3) MG/3ML IN SOLN: 3.0000 mL | Freq: Four times a day (QID) | RESPIRATORY_TRACT | Status: DC

## 2014-08-04 MED ORDER — TOBRAMYCIN 0.3 % OP SOLN: 2.0000 [drp] | Freq: Four times a day (QID) | OPHTHALMIC | Status: DC

## 2014-08-04 MED ORDER — ASPIRIN 81 MG PO CHEW: 81.0000 mg | CHEWABLE_TABLET | Freq: Every day | ORAL | Status: DC

## 2014-08-04 MED ORDER — METOPROLOL TARTRATE 25 MG PO TABS
12.5000 mg | ORAL_TABLET | Freq: Two times a day (BID) | ORAL | Status: DC
Start: 2014-08-04 — End: 2014-09-02

## 2014-08-04 MED ORDER — FUROSEMIDE 40 MG PO TABS: 40.0000 mg | ORAL_TABLET | Freq: Every day | ORAL | Status: DC

## 2014-08-04 NOTE — Progress Notes (Signed)
Pt refuses for writer to d/c foley.

## 2014-08-04 NOTE — Progress Notes (Signed)
SATURATION QUALIFICATIONS: (This note is used to comply with regulatory documentation for home oxygen)  Patient Saturations on Room Air at Rest = 87%  Patient Saturations on Room Air while Ambulating = 81%  Patient Saturations on 6 Liters of oxygen while Ambulating = 90%  Please briefly explain why patient needs home oxygen:needs 3-4 liters at rest to keep sats >90%, and 6 liters with activity

## 2014-08-04 NOTE — Progress Notes (Signed)
Physical Therapy Treatment Patient Details Name: Deborah [name] MRN: [id number] DOB: [birth year] Today's Date: 08/04/2014    History of Present Illness 59 yo female admitted with acute respiratory failure. Hx of COPD, morbid obesity, bipolar d/o, HTN    PT Comments    Assisted to BR then amb a great distance in hallway.  Pt required 6 lts O2 to achieve levels > 90%.    Follow Up Recommendations  Home health PT     Equipment Recommendations  Rolling walker with 5" wheels;3in1 (PT)    Recommendations for Other Services       Precautions / Restrictions Precautions Precautions: Fall Precaution Comments: monitor O2 sats/high O2 requirement Restrictions Weight Bearing Restrictions: No    Mobility  Bed Mobility               General bed mobility comments: Pt sitting EOB on arrival  Transfers Overall transfer level: Needs assistance Equipment used: Rolling walker (2 wheeled)   Sit to Stand: Supervision;Min guard         General transfer comment: good use of hands and safety cognition  Ambulation/Gait Ambulation/Gait assistance: Supervision Ambulation Distance (Feet): 415 Feet Assistive device: None Gait Pattern/deviations: Step-through pattern;Decreased stride length Gait velocity: decreased   General Gait Details: occassional use of rails and x 2 standing rest breaks.   Amb on RA sats dropped to 81% with HR 148.  Applied 3 lts O2 sats mid 80's.  Required 6 lts for amb to achieve levels > 90%.     Stairs            Wheelchair Mobility    Modified Rankin (Stroke Patients Only)       Balance                                    Cognition Arousal/Alertness: Awake/alert Behavior During Therapy: WFL for tasks assessed/performed Overall Cognitive Status: Within Functional Limits for tasks assessed                      Exercises      General Comments        Pertinent Vitals/Pain Pain Assessment: No/denies pain     Home Living                      Prior Function            PT Goals (current goals can now be found in the care plan section) Progress towards PT goals: Progressing toward goals    Frequency  Min 3X/week    PT Plan      Co-evaluation             End of Session Equipment Utilized During Treatment: Gait belt;Oxygen Activity Tolerance: Treatment limited secondary to medical complications (Comment) Patient left: in chair;with call bell/phone within reach     Time: 4656-8127 PT Time Calculation (min) (ACUTE ONLY): 24 min  Charges:  $Gait Training: 8-22 mins $Therapeutic Activity: 8-22 mins                    G Codes:      Rica Koyanagi  PTA [facility]  Acute  Rehab Pager      [phone]

## 2014-08-04 NOTE — Discharge Summary (Signed)
Deborah [name], is a 59 y.o. female  DOB May 03, 1956  MRN [id number].  Admission date:  07/25/2014  Admitting Physician  Deborah Sells, MD  Discharge Date:  08/04/2014   Primary MD  No primary care provider on file.  Recommendations for primary care physician for things to follow:   Please follow renal function, refer to pulmonary for sleep study, and to cardiology for CHF   Admission Diagnosis  SOB (shortness of breath) [R06.02]   Discharge Diagnosis  SOB (shortness of breath) [R06.02]    Active Problems:   CHF, acute on chronic, unknown EF   Hypertension   CRLD (chronic restrictive lung disease) secondary to super morbid obesity with mild COPD component   Tobacco abuse disorder   Bipolar 1 disorder   Pulmonary HTN   Hypoxemia      Past Medical History  Diagnosis Date  . Hypertension   . Restrictive lung disease   . COPD, mild   . Heart failure     New onset 07/25/14  . Obesity (BMI 30-39.9)   . Tobacco abuse   . Hyperkalemia     History reviewed. No pertinent past surgical history.     History of present illness and  Hospital Course:     Kindly see H&P for history of present illness and admission details, please review complete Labs, Consult reports and Test reports for all details in brief  HPI  from the history and physical done on the day of admission    [name] is a pleasant 59 y/o female smoker with COPD, essential Htn, Morbid obesity, Body mass index is 45.12 kg/(m^2)., bipolar disorder, who came to the emergency room with complaints of feeling poorly for the past 6 months followed by worsening short of breath on exertion as well as increasing lower extremity edema. She is being treated for "hypercarbic, hypoxic resp failure likely due to volume overload from previously  undx CHF". Her CXR showed "1. Cardiomegaly. 2. Bibasilar consolidation, atelectasis versus pneumonitis. 3. Moderate left pleural effusion.". 2D Echo showed EF 50-55% with grade 1 diastolic dysfunction with suggestion of Corpulmonale. She had cardiac cath which showed?. Ct chest sows "1. Moderate volume right pleural effusion with right lower lobe airspace consolidation. The right lower lobe is difficult to assess due to lack of IV contrast material. If there are no signs or symptoms of infection then consider diagnostic and therapeutic right thoracentesis and (if the patient can tolerate IV contrast material) a repeat, contrast enhanced CT of the chest to assess for underlying chest lesion. 2. Smaller left pleural effusion with overlying compressive type atelectasis and consolidation. 3. Prior granulomatous disease". She gradually improved with combination of diuretics/bronchodilators/oxygen supplementation/systemic steroids/BiPAP. She is now tolerating oxygen at 4 L nasal cannula, hence will discharge home with home health services to follow with pulmonary/cardiology/her PCP-[facility]. She will need sleep study outpatient. She will need her renal function checked in the next week or so as she is on Lasix and potassium.  Smoking cessation counseling was given.  Discharge Condition: Stable   Follow UP  Follow-up Information    Follow up with [name], MD On 08/12/2014.   Specialty:  Family Medicine   Why:  appointment 01/27 at 11:00 AM. Please keep your appointment.    Contact information:   [place] [phone]       Follow up with Rigoberto Noel., MD On 08/26/2014.   Specialty:  Pulmonary Disease   Why:  Appt at 10:00 AM, for Hospital Follow Up / Pulmonary   Contact information:   [address] [address] [phone]         Discharge Instructions  and  Discharge Medications    Discharge Instructions    Diet - low  sodium heart healthy    Complete by:  As directed      Increase activity slowly    Complete by:  As directed             Medication List    STOP taking these medications        BC HEADACHE PO      TAKE these medications        albuterol (2.5 MG/3ML) 0.083% nebulizer solution  Commonly known as:  PROVENTIL  Take 3 mLs (2.5 mg total) by nebulization every 3 (three) hours as needed for wheezing or shortness of breath.     aspirin 81 MG chewable tablet  Chew 1 tablet (81 mg total) by mouth daily.     furosemide 40 MG tablet  Commonly known as:  LASIX  Take 1 tablet (40 mg total) by mouth daily.     ipratropium-albuterol 0.5-2.5 (3) MG/3ML Soln  Commonly known as:  DUONEB  Take 3 mLs by nebulization every 6 (six) hours.     losartan 50 MG tablet  Commonly known as:  COZAAR  Take 1 tablet (50 mg total) by mouth daily.     metoprolol tartrate 25 MG tablet  Commonly known as:  LOPRESSOR  Take 0.5 tablets (12.5 mg total) by mouth 2 (two) times daily.     montelukast 10 MG tablet  Commonly known as:  SINGULAIR  Take 1 tablet (10 mg total) by mouth at bedtime.     Potassium Chloride ER 20 MEQ Tbcr  Take 10 mEq by mouth daily.     predniSONE 10 MG tablet  Commonly known as:  DELTASONE  Please take 4 tablets daily for 2 days, then 3 tablets daily for 3 days, then 2 tablets daily for 2 days, then 1 tablet daily for 2 days. Total 20 tablets.     tobramycin 0.3 % ophthalmic solution  Commonly known as:  TOBREX  Place 2 drops into the left eye every 6 (six) hours.          Diet and Activity recommendation: See Discharge Instructions above   Consults obtained - pulmonary/cardiology   Major procedures and Radiology Reports - PLEASE review detailed and final reports for all details, in brief -     Dg Chest 2 View  07/28/2014   CLINICAL DATA:  59 year old with shortness of breath and weakness  EXAM: CHEST  2 VIEW  COMPARISON:  07/25/2014  FINDINGS: The cardiac  silhouette is markedly enlarged. The mediastinal contours are unchanged.  Bibasilar lung base consolidation is noted. The left costophrenic angle is blunted, compatible with a moderate-sized pleural effusion on lateral radiograph. There is no overt pulmonary edema. There is no pneumothorax.  The osseous structures are unchanged.  IMPRESSION: 1. Cardiomegaly. 2. Bibasilar consolidation, atelectasis versus pneumonitis. 3. Moderate left pleural effusion.   Electronically Signed   By: Rosemarie Ax   On: 07/28/2014 13:01   Dg Chest 2 View  07/25/2014   CLINICAL DATA:  Shortness of breath, bilateral leg swelling  EXAM: CHEST  2 VIEW  COMPARISON:  04/08/2004  FINDINGS: Cardiomegaly.  No frank interstitial edema.  Mild patchy opacity overlying the right lower hemithorax, atelectasis versus pneumonia, although some of this likely reflects overlying soft tissues.  No pleural effusion or pneumothorax.  IMPRESSION: Cardiomegaly.  No frank interstitial edema.  Right lower lobe opacity, atelectasis versus pneumonia.   Electronically Signed   By: Julian Hy M.D.   On: 07/25/2014 16:39   Ct Chest Wo Contrast  07/30/2014   CLINICAL DATA:  Shortness of breath for 6 to 8 weeks. Smoker. History of COPD and hypertension.  EXAM: CT CHEST WITHOUT CONTRAST  TECHNIQUE: Multidetector CT imaging of the chest was performed following the standard protocol without IV contrast.  COMPARISON:  Chest radiograph 07/28/2016  FINDINGS: Mediastinum: The heart size appears normal. No significant Coronary artery calcifications identified. There is calcified plaque within the aortic arch. There is calcified atherosclerotic disease involving the thoracic aorta. The trachea appears patent and is midline. Calcified right hilar lymph nodes are identified. No mediastinal adenopathy identified.  Lungs/Pleura: Small left pleural effusion with mild overlying consolidation identified in the left lower lobe. Suspect moderate volume right pleural  effusion with airspace consolidation involving the right lower lobe. There may be a centrally located lesion within the right lower lobe measuring mm 3 cm, image 32/series 2. This is difficult to assess due to lack of IV contrast material.  Upper Abdomen: Incidental imaging within the upper abdomen shows no acute findings. The visualized portions of the liver are normal. There are calcified granulomas within the spleen. The adrenal glands are unremarkable. Review of the visualized osseous structures is on unremarkable. No aggressive lytic or sclerotic bone lesions identified.  Musculoskeletal: No aggressive lytic or sclerotic bone lesions.  IMPRESSION: 1. Moderate volume right pleural effusion with right lower lobe airspace consolidation. The right lower lobe is difficult to assess due to lack of IV contrast material. If there are no signs or symptoms of infection then consider diagnostic and therapeutic right thoracentesis and (if the patient can tolerate IV contrast material) a repeat, contrast enhanced CT of the chest to assess for underlying chest lesion. 2. Smaller left pleural effusion with overlying compressive type atelectasis and consolidation. 3. Prior granulomatous disease   Electronically Signed   By: Kerby Moors M.D.   On: 07/30/2014 11:45   Nm Pulmonary Perf And Vent  07/27/2014   CLINICAL DATA:  Shortness of breath, pulmonary hypertension.  EXAM: NUCLEAR MEDICINE VENTILATION - PERFUSION LUNG SCAN  TECHNIQUE: Ventilation images were obtained in multiple projections using inhaled aerosol technetium 99 M DTPA. Perfusion images were obtained in multiple projections after intravenous injection of Tc-70m MAA.  RADIOPHARMACEUTICALS:  5.4 mCi Tc-54m DTPA aerosol and 43.7 mCi Tc-59m MAA  COMPARISON:  Chest radiograph of July 25, 2014.  FINDINGS: Ventilation: Multiple defects are noted, but the largest appears to be in the right lower lobe.  Perfusion: Predominantly linear defect is seen involving the  right lung which most likely corresponds to fluid in the right major fissure. No other significant defects are noted. The chest radiograph does demonstrate opacity in the right lower lobe which potentially may involve fluid in  the minor and major fissures.  IMPRESSION: Findings most consistent with low probability of pulmonary embolus.   Electronically Signed   By: Sabino Dick M.D.   On: 07/27/2014 13:07    Micro Results     Recent Results (from the past 240 hour(s))  Culture, blood (routine x 2)     Status: None   Collection Time: 07/25/14  5:14 PM  Result Value Ref Range Status   Specimen Description BLOOD LEFT ANTECUBITAL  Final   Special Requests BOTTLES DRAWN AEROBIC AND ANAEROBIC 5 CC EACH  Final   Culture   Final    NO GROWTH 5 DAYS Performed at [facility]    Report Status 07/31/2014 FINAL  Final  Culture, blood (routine x 2)     Status: None   Collection Time: 07/25/14  5:14 PM  Result Value Ref Range Status   Specimen Description BLOOD RIGHT ANTECUBITAL  Final   Special Requests BOTTLES DRAWN AEROBIC AND ANAEROBIC 5 CC EACH  Final   Culture   Final    NO GROWTH 5 DAYS Performed at [facility]    Report Status 07/31/2014 FINAL  Final  MRSA PCR Screening     Status: None   Collection Time: 07/25/14  6:33 PM  Result Value Ref Range Status   MRSA by PCR NEGATIVE NEGATIVE Final    Comment:        The GeneXpert MRSA Assay (FDA approved for NASAL specimens only), is one component of a comprehensive MRSA colonization surveillance program. It is not intended to diagnose MRSA infection nor to guide or monitor treatment for MRSA infections.   Culture, expectorated sputum-assessment     Status: None   Collection Time: 07/29/14  1:52 PM  Result Value Ref Range Status   Specimen Description SPUTUM  Final   Special Requests Normal  Final   Sputum evaluation   Final    THIS SPECIMEN IS ACCEPTABLE. RESPIRATORY CULTURE REPORT TO FOLLOW.   Report Status  07/29/2014 FINAL  Final  Culture, respiratory (NON-Expectorated)     Status: None   Collection Time: 07/29/14  1:52 PM  Result Value Ref Range Status   Specimen Description SPUTUM  Final   Special Requests NONE  Final   Gram Stain   Final    RARE WBC PRESENT, PREDOMINANTLY MONONUCLEAR RARE SQUAMOUS EPITHELIAL CELLS PRESENT FEW GRAM POSITIVE COCCI IN PAIRS IN CLUSTERS Performed at [facility]    Culture   Final    NORMAL OROPHARYNGEAL FLORA Performed at [facility]    Report Status 08/01/2014 FINAL  Final       Today   Subjective:   [name] today has no headache,no chest abdominal pain,no new weakness tingling or numbness, feels much better wants to go home today.   Objective:   Blood pressure 140/74, pulse 92, temperature 98 F (36.7 C), temperature source Oral, resp. rate 18, height 5\' 4"  (1.626 m), weight 105.053 kg (231 lb 9.6 oz), SpO2 92 %.   Intake/Output Summary (Last 24 hours) at 08/04/14 1511 Last data filed at 08/04/14 1300  Gross per 24 hour  Intake    960 ml  Output   1800 ml  Net   -840 ml    Exam Awake Alert, Oriented x 3, No new F.N deficits, Normal affect [place].AT,PERRAL Supple Neck,No JVD, No cervical lymphadenopathy appriciated.  Symmetrical Chest wall movement, Good air movement bilaterally, CTAB RRR,No Gallops,Rubs or new Murmurs, No Parasternal Heave +ve B.Sounds, Abd Soft,  Non tender, No organomegaly appriciated, No rebound -guarding or rigidity. No Cyanosis, Clubbing or edema, No new Rash or bruise  Data Review   CBC w Diff: Lab Results  Component Value Date   WBC 9.1 08/03/2014   HGB 14.2 08/03/2014   HCT 49.1* 08/03/2014   PLT 160 08/03/2014   LYMPHOPCT 3* 07/30/2014   MONOPCT 5 07/30/2014   EOSPCT 0 07/30/2014   BASOPCT 0 07/30/2014    CMP: Lab Results  Component Value Date   NA 141 08/04/2014   K 3.1* 08/04/2014   CL 94* 08/04/2014   CO2 39* 08/04/2014   BUN 20 08/04/2014   CREATININE 0.45*  08/04/2014   PROT 6.2 07/31/2014   ALBUMIN 3.1* 07/31/2014   BILITOT 1.7* 07/31/2014   ALKPHOS 56 07/31/2014   AST 81* 07/31/2014   ALT 84* 07/31/2014  .   Total Time in preparing paper work, data evaluation and todays exam - 35 minutes  Shekita Boyden M.D on 08/04/2014 at 3:11 PM  [facility] Office  [phone]

## 2014-08-04 NOTE — Progress Notes (Signed)
RT placed bipap on patient. Setting is auto 20/5. Sterile water placed into water chamber for humidification. Patient is tolerating well. RT will continue to monitor and assess as needed.

## 2014-08-04 NOTE — Progress Notes (Signed)
SATURATION QUALIFICATIONS: (This note is used to comply with regulatory documentation for home oxygen)  Patient Saturations on Room Air at Rest = 90%  Patient Saturations on Room Air while Ambulating = 81%    HR 148  Patient Saturations on 3 Liters of oxygen while Ambulating = 86%   HR  139                                      6                                                           91%   HR   128 Please briefly explain why patient needs home oxygen:  Pt required 6 lts during amb to achieve > 90%  Rica Koyanagi  PTA [facility]  Acute  Rehab Pager      [phone]

## 2014-08-05 NOTE — Progress Notes (Signed)
  CARE MANAGEMENT ED NOTE 08/05/2014  Patient:  MARIONETTE, MESKILL   Account Number:  [id number]  Date Initiated:  08/05/2014  Documentation initiated by:  Livia Snellen  Subjective/Objective Assessment:   Patient admitted to hospital with shortness of breath     Subjective/Objective Assessment Detail:     Action/Plan:   Action/Plan Detail:   Anticipated DC Date:  08/04/2014     Status Recommendation to Physician:   Result of Recommendation:    Other ED [place]  CM consult  Other    Choice offered to / List presented to:       DME agency  [place].        Status of service:  Completed, signed off  ED Comments:   ED Comments Detail:  [facility] received phone call from [place] [facility] in regards to patient never received a nebulizer machine at home.  EDCM called patient's daughter [name] at [phone].  Per patient's daughter, patient was given prescription for "medicine to put in the machine, but there's no machine to put it into."  Patient discharged on 01/19 by Dr. Mat Carne. Dr. Mat Carne not in amion this evening.  Per chart review no order for nebulizer machine placed.  EDCM informed patient's daughter that she may purchase nebulizer machine at [place] store.  [facility] provided patient's daughter to [facility] [phone].  EDCM informed patient's daughter that if she needs a prescription for nebulizer machine, she could call patient's pcp and have pcp fax order for nebulizer to [facility] and then she coulf pick it up at the [facility] store.  EDCM attempted to call Dr. Hulen Shouts office without succes, unable to leave message reagrding nebulizer machine.  Patient's daughter agreeable to above plan and thankful for services. No further EDCM needs at this time.

## 2014-08-06 ENCOUNTER — Other Ambulatory Visit: Payer: Self-pay | Admitting: Internal Medicine

## 2014-08-06 ENCOUNTER — Telehealth: Payer: Self-pay | Admitting: *Deleted

## 2014-08-06 DIAGNOSIS — J449 Chronic obstructive pulmonary disease, unspecified: Secondary | ICD-10-CM

## 2014-08-06 MED ORDER — LEVALBUTEROL HCL 0.31 MG/3ML IN NEBU: 1.0000 | INHALATION_SOLUTION | RESPIRATORY_TRACT | Status: DC | PRN

## 2014-08-06 NOTE — Progress Notes (Signed)
08/06/2014 A. Mishell Donalson RNCM 1630pm EDCM called patient's daughter [name] for follow up regarding nebulizer machine without success.  EDCM left phone number for call back.  No further EDCM needs at this time.

## 2014-08-06 NOTE — Telephone Encounter (Signed)
Spoke to pt and informed her Rx is available for pickup; offered to mail it but pt states she wouldn't receive it in time

## 2014-08-06 NOTE — Telephone Encounter (Signed)
Pt left voicemail at Triage requesting an order for a nebulizer to use at home. Pt said she was using one at the hospital and now that she is at home she would like one to have at home.

## 2014-08-06 NOTE — Progress Notes (Signed)
08/06/2014 A. Florette Thai RNCM 1648pm EDCM received phone call from patient's daughter [name] who confirms she was able to get prescription for nebulizer and picked it up at medical supply store.  No further EDCM needs at this time.

## 2014-08-06 NOTE — Telephone Encounter (Signed)
I have written the RX for the nebulizer machine. She will need to take it to a medical supply store to be filled. I have called in the nebulizer solution to her pharmacy

## 2014-08-10 ENCOUNTER — Encounter (HOSPITAL_COMMUNITY): Payer: Self-pay | Admitting: *Deleted

## 2014-08-10 ENCOUNTER — Inpatient Hospital Stay: Payer: 59 | Admitting: Family Medicine

## 2014-08-12 ENCOUNTER — Encounter: Payer: Self-pay | Admitting: Family Medicine

## 2014-08-12 ENCOUNTER — Telehealth: Payer: Self-pay | Admitting: Family Medicine

## 2014-08-12 ENCOUNTER — Ambulatory Visit (INDEPENDENT_AMBULATORY_CARE_PROVIDER_SITE_OTHER): Payer: 59 | Admitting: Family Medicine

## 2014-08-12 DIAGNOSIS — E8779 Other fluid overload: Secondary | ICD-10-CM

## 2014-08-12 DIAGNOSIS — Z720 Tobacco use: Secondary | ICD-10-CM

## 2014-08-12 DIAGNOSIS — J984 Other disorders of lung: Secondary | ICD-10-CM

## 2014-08-12 DIAGNOSIS — I270 Primary pulmonary hypertension: Secondary | ICD-10-CM

## 2014-08-12 DIAGNOSIS — I5033 Acute on chronic diastolic (congestive) heart failure: Secondary | ICD-10-CM

## 2014-08-12 DIAGNOSIS — E8770 Fluid overload, unspecified: Secondary | ICD-10-CM | POA: Insufficient documentation

## 2014-08-12 DIAGNOSIS — R0600 Dyspnea, unspecified: Secondary | ICD-10-CM

## 2014-08-12 DIAGNOSIS — I272 Pulmonary hypertension, unspecified: Secondary | ICD-10-CM

## 2014-08-12 DIAGNOSIS — R079 Chest pain, unspecified: Secondary | ICD-10-CM

## 2014-08-12 LAB — COMPREHENSIVE METABOLIC PANEL
ALT: 91 U/L — ABNORMAL HIGH (ref 0–35)
AST: 25 U/L (ref 0–37)
Albumin: 3.6 g/dL (ref 3.5–5.2)
Alkaline Phosphatase: 80 U/L (ref 39–117)
BUN: 21 mg/dL (ref 6–23)
CO2: 33 mEq/L — ABNORMAL HIGH (ref 19–32)
Calcium: 9.6 mg/dL (ref 8.4–10.5)
Chloride: 96 mEq/L (ref 96–112)
Creatinine, Ser: 0.65 mg/dL (ref 0.40–1.20)
GFR: 100.14 mL/min (ref >60.00)
Glucose, Bld: 106 mg/dL — ABNORMAL HIGH (ref 70–99)
Potassium: 4.9 mEq/L (ref 3.5–5.1)
Sodium: 136 mEq/L (ref 135–145)
Total Bilirubin: 1.3 mg/dL — ABNORMAL HIGH (ref 0.2–1.2)
Total Protein: 7.4 g/dL (ref 6.0–8.3)

## 2014-08-12 NOTE — Telephone Encounter (Signed)
Patient was seen today and she wants to know if she can take Allegra with her medications.

## 2014-08-12 NOTE — Assessment & Plan Note (Signed)
Improved s/p diruesis. Lung exam reassuring.  No signs of volume overload evident on exam. Will check labs today- lytes and renal function since she is taking lasix and potassium. Orders Placed This Encounter  Procedures  . Comprehensive metabolic panel  . Ambulatory referral to Cardiology   The patient indicates understanding of these issues and agrees with the plan.

## 2014-08-12 NOTE — Patient Instructions (Signed)
Good to see you. I will call you with your lab results.  We will call you with your heart doctor appointment.

## 2014-08-12 NOTE — Progress Notes (Signed)
Subjective:   Patient ID: Deborah [name], female    DOB: 30-Jul-1955, 59 y.o.   MRN: [id number]  Deborah [name] is a pleasant 59 y.o. year old female with h/o COPD with current tobacco abuse, HTN, bipolar disorder, morbid obesity,  whom I have not seen since 04/2013, presents  to clinic today with Hospitalization Follow-up  on 08/12/2014  HPI: Notes reviewed- admitted to [facility] 1/9 - 08/04/14 with admitting diagnosis of shortness of breath, progressive over past 6 months although with rapid weight gain.  Wt Readings from Last 3 Encounters:  08/12/14 213 lb 4 oz (96.73 kg)  08/03/14 231 lb 9.6 oz (105.053 kg)  06/05/13 250 lb (113.399 kg)    CXR showed cardiomegaly, bibasilar consolidation and left pleural effusion.  CT of chest completed as well, along with VQ scan (low probability of PED)  and 2 d echo (grade 1 diastolic dysfunction with concern for pulmonary etiology). No thoracentesis done.              Transthoracic Echocardiography  ------------------------------------------------------------------- Study Conclusions  - Left ventricle: The cavity size was normal. Wall thickness was at the upper limits of normal. Systolic function was normal. The estimated ejection fraction was in the range of 50% to 55%. Although no diagnostic regional wall motion abnormality was identified, this possibility cannot be completely excluded on the basis of this study. Doppler parameters are consistent with abnormal left ventricular relaxation (grade 1 diastolic dysfunction). - Ventricular septum: Septal motion showed paradox. The contour showed diastolic flattening and systolic flattening. - Mitral valve: There was trivial regurgitation. - Right ventricle: The cavity size was severely dilated. The moderator band was prominent. Systolic function was reduced - mild at the base and worse overall in the mid to apical region with apparent akinesis of the apical  free wall - cannot exclude McConnell&'s sign. - Right atrium: The atrium was severely dilated. Central venous pressure (est): 15 mm Hg. - Tricuspid valve: There was mild to possibly moderate regurgitation. - Pulmonary arteries: PA peak pressure: 38 mm Hg (S). - Pericardium, extracardiac: There was no pericardial effusion.  Impressions:  - Upper normal LV wall thickness with LVEF 78-29%, grade 1 diastolic dysfunction. Septal paradox and also flattening consistent with RV pressure and volume overload. Trivial mitral regurgitation. Severely dilated RV. Systolic function was reduced - mild at the base and worse overall in the mid to apical region with apparent akinesis of the apical free wall - cannot exclude McConnell&'s sign. This is consistent with cor pulmonale, consider possible pulmonary etiologies such as pulmonary embolic disease, OSA/hypoventilation, or other intrinsic lung disease. PASP only 38 mmHg by this study with elevated estimated CVP.  -Dg Chest 2 View  07/28/2014   CLINICAL DATA:  59 year old with shortness of breath and weakness  EXAM: CHEST  2 VIEW  COMPARISON:  07/25/2014  FINDINGS: The cardiac silhouette is markedly enlarged. The mediastinal contours are unchanged.  Bibasilar lung base consolidation is noted. The left costophrenic angle is blunted, compatible with a moderate-sized pleural effusion on lateral radiograph. There is no overt pulmonary edema. There is no pneumothorax.  The osseous structures are unchanged.  IMPRESSION: 1. Cardiomegaly. 2. Bibasilar consolidation, atelectasis versus pneumonitis. 3. Moderate left pleural effusion.   Electronically Signed   By: Rosemarie Ax   On: 07/28/2014 13:01   Dg Chest 2 View  07/25/2014   CLINICAL DATA:  Shortness of breath, bilateral leg swelling  EXAM: CHEST  2 VIEW  COMPARISON:  04/08/2004  FINDINGS:  Cardiomegaly.  No frank interstitial edema.  Mild patchy opacity overlying the right lower  hemithorax, atelectasis versus pneumonia, although some of this likely reflects overlying soft tissues.  No pleural effusion or pneumothorax.  IMPRESSION: Cardiomegaly.  No frank interstitial edema.  Right lower lobe opacity, atelectasis versus pneumonia.   Electronically Signed   By: Julian Hy M.D.   On: 07/25/2014 16:39   Ct Chest Wo Contrast  07/30/2014   CLINICAL DATA:  Shortness of breath for 6 to 8 weeks. Smoker. History of COPD and hypertension.  EXAM: CT CHEST WITHOUT CONTRAST  TECHNIQUE: Multidetector CT imaging of the chest was performed following the standard protocol without IV contrast.  COMPARISON:  Chest radiograph 07/28/2016  FINDINGS: Mediastinum: The heart size appears normal. No significant Coronary artery calcifications identified. There is calcified plaque within the aortic arch. There is calcified atherosclerotic disease involving the thoracic aorta. The trachea appears patent and is midline. Calcified right hilar lymph nodes are identified. No mediastinal adenopathy identified.  Lungs/Pleura: Small left pleural effusion with mild overlying consolidation identified in the left lower lobe. Suspect moderate volume right pleural effusion with airspace consolidation involving the right lower lobe. There may be a centrally located lesion within the right lower lobe measuring mm 3 cm, image 32/series 2. This is difficult to assess due to lack of IV contrast material.  Upper Abdomen: Incidental imaging within the upper abdomen shows no acute findings. The visualized portions of the liver are normal. There are calcified granulomas within the spleen. The adrenal glands are unremarkable. Review of the visualized osseous structures is on unremarkable. No aggressive lytic or sclerotic bone lesions identified.  Musculoskeletal: No aggressive lytic or sclerotic bone lesions.  IMPRESSION: 1. Moderate volume right pleural effusion with right lower lobe airspace consolidation. The right lower lobe is  difficult to assess due to lack of IV contrast material. If there are no signs or symptoms of infection then consider diagnostic and therapeutic right thoracentesis and (if the patient can tolerate IV contrast material) a repeat, contrast enhanced CT of the chest to assess for underlying chest lesion. 2. Smaller left pleural effusion with overlying compressive type atelectasis and consolidation. 3. Prior granulomatous disease   Electronically Signed   By: Kerby Moors M.D.   On: 07/30/2014 11:45   Nm Pulmonary Perf And Vent  07/27/2014   CLINICAL DATA:  Shortness of breath, pulmonary hypertension.  EXAM: NUCLEAR MEDICINE VENTILATION - PERFUSION LUNG SCAN  TECHNIQUE: Ventilation images were obtained in multiple projections using inhaled aerosol technetium 99 M DTPA. Perfusion images were obtained in multiple projections after intravenous injection of Tc-27m MAA.  RADIOPHARMACEUTICALS:  5.4 mCi Tc-74m DTPA aerosol and 43.7 mCi Tc-44m MAA  COMPARISON:  Chest radiograph of July 25, 2014.  FINDINGS: Ventilation: Multiple defects are noted, but the largest appears to be in the right lower lobe.  Perfusion: Predominantly linear defect is seen involving the right lung which most likely corresponds to fluid in the right major fissure. No other significant defects are noted. The chest radiograph does demonstrate opacity in the right lower lobe which potentially may involve fluid in the minor and major fissures.  IMPRESSION: Findings most consistent with low probability of pulmonary embolus.   Electronically Signed   By: Sabino Dick M.D.   On: 07/27/2014 13:07   Symptoms improved per hospital notes and pt after diuretics, oxygen at 4L per [place], steroids and bipap. D/c'd home lasix 40 mg daily, HCTZ 12.5 mg daily, Kdue 20 mEq daily. Also  sent home with xopenex and duoneb and prn albuterol.  Of note, d/c summary asks that I refer her to pulmonary for sleep study but I referred her to pulmonary in 2014. Last saw Dr.  Melvyn Novas on 06/05/13.  Note reviewed- PFTs completed and he stated that he felt nothing further was warranted other than weight loss. At that time, she had quit smoking months prior.  Had  restarted smoking since then but has not smoked since she was admitted to hospital.   Appt scheduled with Dr. Elsworth Soho for next month for sleep study. No appointment made with a cardiologist. Since discharge, has had some intermittent chest pressure.  Pepto bismol seemed to help.  Current Outpatient Prescriptions on File Prior to Visit  Medication Sig Dispense Refill  . albuterol (PROVENTIL) (2.5 MG/3ML) 0.083% nebulizer solution Take 3 mLs (2.5 mg total) by nebulization every 3 (three) hours as needed for wheezing or shortness of breath. 75 mL 12  . aspirin 81 MG chewable tablet Chew 1 tablet (81 mg total) by mouth daily. 30 tablet 1  . Aspirin-Salicylamide-Caffeine (ARTHRITIS STRENGTH BC POWDER PO) as needed.    . furosemide (LASIX) 40 MG tablet Take 1 tablet (40 mg total) by mouth daily. 30 tablet 0  . hydrochlorothiazide (HYDRODIURIL) 12.5 MG tablet Take 1 tablet (12.5 mg total) by mouth daily. 90 tablet 3  . ipratropium-albuterol (DUONEB) 0.5-2.5 (3) MG/3ML SOLN Take 3 mLs by nebulization every 6 (six) hours. 360 mL 0  . levalbuterol (XOPENEX) 0.31 MG/3ML nebulizer solution Take 3 mLs (0.31 mg total) by nebulization every 4 (four) hours as needed for wheezing. 3 mL 12  . losartan (COZAAR) 50 MG tablet Take 1 tablet (50 mg total) by mouth daily. 30 tablet 0  . metoprolol tartrate (LOPRESSOR) 25 MG tablet Take 0.5 tablets (12.5 mg total) by mouth 2 (two) times daily. 60 tablet 0  . montelukast (SINGULAIR) 10 MG tablet Take 1 tablet (10 mg total) by mouth at bedtime. 30 tablet 0  . potassium chloride 20 MEQ TBCR Take 10 mEq by mouth daily. 30 tablet 0  . tobramycin (TOBREX) 0.3 % ophthalmic solution Place 2 drops into the left eye every 6 (six) hours. 5 mL 0  . varenicline (CHANTIX) 1 MG tablet Take 1 tablet (1 mg  total) by mouth 2 (two) times daily. 60 tablet 3   No current facility-administered medications on file prior to visit.    No Known Allergies  Past Medical History  Diagnosis Date  . Umbilical hernia   . Asthma   . Anxiety   . Depression   . Shortness of breath   . Pneumonia     hx of  . Arthritis   . Anemia     as teen  . PONV (postoperative nausea and vomiting)   . Family history of anesthesia complication     vomiting  . Histoplasmosis     left eye  . Hypertension   . Restrictive lung disease   . COPD, mild   . Heart failure     New onset 07/25/14  . Obesity (BMI 30-39.9)   . Tobacco abuse   . Hyperkalemia     Past Surgical History  Procedure Laterality Date  . Cesarean section  04/12/85  . Ventral hernia repair N/A 03/19/2013    Procedure:  OPEN VENTRAL HERNIA REPAIR WITH MESH AND APPLICATION OF WOUND VAC;  Surgeon: Madilyn Hook, DO;  Location: [facility];  Service: General;  Laterality: N/A;  . Insertion of mesh  N/A 03/19/2013    Procedure: INSERTION OF MESH;  Surgeon: Madilyn Hook, DO;  Location: [facility];  Service: General;  Laterality: N/A;  . Application of wound vac  03/19/2013    Procedure: APPLICATION OF WOUND VAC;  Surgeon: Madilyn Hook, DO;  Location: [facility];  Service: General;;    Family History  Problem Relation Age of Onset  . Heart attack Mother   . Emphysema Mother     was a smoker  . Lymphoma Maternal Uncle   . Pancreatic cancer Maternal Aunt   . Emphysema      Mother  . Heart failure      Father  . Cancer      Unknown    History   Social History  . Marital Status: Married    Spouse Name: N/A    Number of Children: 1  . Years of Education: N/A   Occupational History  . Retired    Social History Main Topics  . Smoking status: Current Every Day Smoker  . Smokeless tobacco: Not on file  . Alcohol Use: Yes     Comment: rare  . Drug Use: No  . Sexual Activity: Not on file   Other Topics Concern  . Not on file   Social History Narrative    ** Merged History Encounter **       ** Data from: 07/25/14 Enc Dept: [facility]       ** Data from: 06/05/13 Enc Dept: [facility]   Married, one son 17 yo.            The PMH, PSH, Social History, Family History, Medications, and allergies have been reviewed in [facility], and have been updated if relevant.   Review of Systems  Constitutional: Positive for fatigue.  HENT: Negative.   Respiratory: Positive for chest tightness and shortness of breath. Negative for cough.   Cardiovascular: Negative.   Gastrointestinal: Negative.   Endocrine: Negative.   Musculoskeletal: Negative.   Skin: Negative.   Allergic/Immunologic: Negative.   Neurological: Negative.   Hematological: Negative.   Psychiatric/Behavioral: Negative.        Objective:    BP 134/88 mmHg  Pulse 95  Temp(Src) 97.8 F (36.6 C) (Oral)  Wt 213 lb 4 oz (96.73 kg)  SpO2 97% Wt Readings from Last 3 Encounters:  08/12/14 213 lb 4 oz (96.73 kg)  08/03/14 231 lb 9.6 oz (105.053 kg)  06/05/13 250 lb (113.399 kg)     Physical Exam  Constitutional: She is oriented to person, place, and time. She appears well-developed and well-nourished. No distress.  HENT:  Head: Normocephalic.  O2 per [place]- 4L  Eyes: Left conjunctiva is injected.  Neck: Normal range of motion.  Cardiovascular: Normal rate and regular rhythm.   Pulmonary/Chest: Effort normal. No accessory muscle usage. No respiratory distress. She has decreased breath sounds in the right lower field and the left lower field. She has no wheezes. She has no rhonchi.  Abdominal: Soft.  Musculoskeletal: She exhibits no edema.  Neurological: She is alert and oriented to person, place, and time. No cranial nerve deficit.  Skin: Skin is warm and dry.  Psychiatric: She has a normal mood and affect. Her behavior is normal. Thought content normal.  Nursing note and vitals reviewed.         Assessment & Plan:   Morbid obesity  Tobacco abuse  disorder  Acute on chronic diastolic congestive heart failure  CRLD (chronic restrictive lung disease) secondary to super morbid  obesity with mild COPD component  Dyspnea - Plan: Comprehensive metabolic panel No Follow-up on file.

## 2014-08-12 NOTE — Assessment & Plan Note (Signed)
Quit smoking- encouraged her to not restart.

## 2014-08-12 NOTE — Assessment & Plan Note (Signed)
Improved on 4L O2- this is a complex issue- certainly weight is playing a roll but given severe volume overload and findings on echo- she does need a more extensive pulmonary evaluation and cardiology work up. Has appt with Dr. Elsworth Soho for possible sleep study and further evaluation. She will keep appt.

## 2014-08-12 NOTE — Assessment & Plan Note (Signed)
Resolved.  EKG reassuring today- NSR but still needs cardiac w/u. The patient indicates understanding of these issues and agrees with the plan.

## 2014-08-12 NOTE — Progress Notes (Signed)
Pre visit review using our clinic review tool, if applicable. No additional management support is needed unless otherwise documented below in the visit note. 

## 2014-08-13 ENCOUNTER — Telehealth: Payer: Self-pay | Admitting: Family Medicine

## 2014-08-13 MED ORDER — FEXOFENADINE HCL 30 MG/5ML PO SUSP: 30.0000 mg | Freq: Every day | ORAL | Status: DC

## 2014-08-13 NOTE — Telephone Encounter (Signed)
Spoke to pt and advised per Dr Aron; pt verbally expressed understanding.  

## 2014-08-13 NOTE — Telephone Encounter (Signed)
Yes she can take allegra without decongestant- so just regular allegra is ok but I would stay away from allegra d.

## 2014-08-13 NOTE — Addendum Note (Signed)
Addended by: [name] on: 08/13/2014 09:00 AM   Modules accepted: Orders

## 2014-08-13 NOTE — Telephone Encounter (Signed)
emmi emailed °

## 2014-08-14 ENCOUNTER — Telehealth: Payer: Self-pay | Admitting: Family Medicine

## 2014-08-14 NOTE — Telephone Encounter (Signed)
[name] (daughter) called in with an update on her mother. She stated that mom declined the nurse visit for this week.  Pt said that she is feeling too worn out from the week to have another visit. She wants to wait until next week

## 2014-08-17 NOTE — Telephone Encounter (Signed)
Noted! Thank you

## 2014-08-18 ENCOUNTER — Telehealth: Payer: Self-pay

## 2014-08-18 DIAGNOSIS — I509 Heart failure, unspecified: Secondary | ICD-10-CM | POA: Diagnosis not present

## 2014-08-18 DIAGNOSIS — J449 Chronic obstructive pulmonary disease, unspecified: Secondary | ICD-10-CM | POA: Diagnosis not present

## 2014-08-18 DIAGNOSIS — I10 Essential (primary) hypertension: Secondary | ICD-10-CM | POA: Diagnosis not present

## 2014-08-18 NOTE — Telephone Encounter (Signed)
Heather nurse with [facility] left v/m; pt is declining any further nursing or PT visits from home care; pt told Heather pt's ins will only approve 2 nurse visits and one PT visit. Pt told [name] that all her needs are med at this time.

## 2014-08-20 ENCOUNTER — Telehealth: Payer: Self-pay

## 2014-08-20 ENCOUNTER — Other Ambulatory Visit: Payer: Self-pay | Admitting: Family Medicine

## 2014-08-20 DIAGNOSIS — R945 Abnormal results of liver function studies: Secondary | ICD-10-CM

## 2014-08-20 NOTE — Telephone Encounter (Signed)
Pt left v/m; this morning pt gained 3 lbs since yesterday; 08/19/14 weight was 212 lbs and this morning wt is 215 lbs. Spoke with pt and no unusual swelling in lower legs now but pt noticed last few days lt foot is more swollen than usual at end of day; pt tries to keep feet up during the day but swelling in feet does go back down after legs up in bed all night. The only other swelling noticed is swelling in fingers. No SOB or CP;pt is on continuous O2 at 4 L. Pt has taken Lasix 40 mg this AM. Pt request cb. [facility] [address]

## 2014-08-21 ENCOUNTER — Encounter: Payer: Self-pay | Admitting: Internal Medicine

## 2014-08-21 ENCOUNTER — Ambulatory Visit (INDEPENDENT_AMBULATORY_CARE_PROVIDER_SITE_OTHER): Payer: 59 | Admitting: Internal Medicine

## 2014-08-21 ENCOUNTER — Telehealth: Payer: Self-pay | Admitting: Family Medicine

## 2014-08-21 VITALS — BP 120/78 | HR 102 | Temp 97.5°F | Wt 205.0 lb

## 2014-08-21 DIAGNOSIS — I5032 Chronic diastolic (congestive) heart failure: Secondary | ICD-10-CM

## 2014-08-21 DIAGNOSIS — R0602 Shortness of breath: Secondary | ICD-10-CM

## 2014-08-21 DIAGNOSIS — R609 Edema, unspecified: Secondary | ICD-10-CM

## 2014-08-21 MED ORDER — FUROSEMIDE 20 MG PO TABS: 20.0000 mg | ORAL_TABLET | Freq: Every day | ORAL | Status: DC | PRN

## 2014-08-21 NOTE — Progress Notes (Signed)
Subjective:    Patient ID: Deborah [name], female    DOB: 11-19-1955, 59 y.o.   MRN: [id number]  HPI  Pt presents to the clinic today with c/o > 3 lb weight gain, leg swelling and mild shortness of breath. She was recently admitted to ER for acute CHF. Hospital follow from 08/11/14 reviewed. She is on Lasix 40 mg lasix daily, HCTZ 12.5 mg and potassium supplement. She has been taking medications as prescribed. She does have a complicated pulmonary history, including restrictive lung disease d/t obesity with mild COPD. She has duoneb and xopenex nebs if needed. She is also on losartan and metoprolol. She has seen a pulmonology in the past and recently saw Dr. Elsworth Soho. She has a cardiology follow up next week.  Review of Systems      Past Medical History  Diagnosis Date  . Umbilical hernia   . Asthma   . Anxiety   . Depression   . Shortness of breath   . Pneumonia     hx of  . Arthritis   . Anemia     as teen  . PONV (postoperative nausea and vomiting)   . Family history of anesthesia complication     vomiting  . Histoplasmosis     left eye  . Hypertension   . Restrictive lung disease   . COPD, mild   . Heart failure     New onset 07/25/14  . Obesity (BMI 30-39.9)   . Tobacco abuse   . Hyperkalemia     Current Outpatient Prescriptions  Medication Sig Dispense Refill  . albuterol (PROVENTIL) (2.5 MG/3ML) 0.083% nebulizer solution Take 3 mLs (2.5 mg total) by nebulization every 3 (three) hours as needed for wheezing or shortness of breath. 75 mL 12  . aspirin 81 MG chewable tablet Chew 1 tablet (81 mg total) by mouth daily. 30 tablet 1  . fexofenadine (ALLEGRA) 30 MG/5ML suspension Take 5 mLs (30 mg total) by mouth daily. 300 mL 12  . furosemide (LASIX) 40 MG tablet Take 1 tablet (40 mg total) by mouth daily. 30 tablet 0  . hydrochlorothiazide (HYDRODIURIL) 12.5 MG tablet Take 1 tablet (12.5 mg total) by mouth daily. 90 tablet 3  . ipratropium-albuterol (DUONEB) 0.5-2.5 (3)  MG/3ML SOLN Take 3 mLs by nebulization every 6 (six) hours. 360 mL 0  . levalbuterol (XOPENEX) 0.31 MG/3ML nebulizer solution Take 3 mLs (0.31 mg total) by nebulization every 4 (four) hours as needed for wheezing. 3 mL 12  . losartan (COZAAR) 50 MG tablet Take 1 tablet (50 mg total) by mouth daily. 30 tablet 0  . metoprolol tartrate (LOPRESSOR) 25 MG tablet Take 0.5 tablets (12.5 mg total) by mouth 2 (two) times daily. 60 tablet 0  . montelukast (SINGULAIR) 10 MG tablet Take 1 tablet (10 mg total) by mouth at bedtime. 30 tablet 0  . potassium chloride 20 MEQ TBCR Take 10 mEq by mouth daily. 30 tablet 0  . tobramycin (TOBREX) 0.3 % ophthalmic solution Place 2 drops into the left eye every 6 (six) hours. 5 mL 0   No current facility-administered medications for this visit.    No Known Allergies  Family History  Problem Relation Age of Onset  . Heart attack Mother   . Emphysema Mother     was a smoker  . Lymphoma Maternal Uncle   . Pancreatic cancer Maternal Aunt   . Emphysema      Mother  . Heart failure  Father  . Cancer      Unknown    History   Social History  . Marital Status: Married    Spouse Name: N/A    Number of Children: 1  . Years of Education: N/A   Occupational History  . Retired    Social History Main Topics  . Smoking status: Current Every Day Smoker  . Smokeless tobacco: Not on file     Comment: quit x 1 month ago 07/2014  . Alcohol Use: 0.0 oz/week    0 Not specified per week     Comment: rare  . Drug Use: No  . Sexual Activity: Not on file   Other Topics Concern  . Not on file   Social History Narrative   ** Merged History Encounter **       ** Data from: 07/25/14 Enc Dept: [facility]       ** Data from: 06/05/13 Enc Dept: [facility]   Married, one son 33 yo.              Constitutional: Pt reports fatigue. Denies fever, malaise, headache or abrupt weight changes.  HEENT: Denies eye pain, eye redness, ear pain, ringing  in the ears, wax buildup, runny nose, nasal congestion, bloody nose, or sore throat. Respiratory: Pt reports shortness of breath. Denies difficulty breathing, cough or sputum production.   Cardiovascular: Pt reports leg swelling. Denies chest pain, chest tightness, palpitations or swelling in the hands.  Neurological: Denies dizziness, difficulty with memory, difficulty with speech or problems with balance and coordination.   No other specific complaints in a complete review of systems (except as listed in HPI above).  Objective:   Physical Exam   BP 120/78 mmHg  Pulse 102  Temp(Src) 97.5 F (36.4 C) (Oral)  Wt 205 lb (92.987 kg)  SpO2 98% Wt Readings from Last 3 Encounters:  08/21/14 205 lb (92.987 kg)  08/12/14 213 lb 4 oz (96.73 kg)  08/03/14 231 lb 9.6 oz (105.053 kg)    General: Appears her stated age, obese but well developed, well nourished in NAD. Skin: Warm, dry and intact. No rashes, lesions or ulcerations noted. Neck: Neck supple, trachea midline. No massses, lumps or thyromegaly present.  Cardiovascular: Normal rate and rhythm. S1,S2 noted.  No murmur, rubs or gallops noted. 2+ pitting BLE edema noted. Pulmonary/Chest: Normal effort and positive vesicular breath sounds. No respiratory distress. No wheezes, rales or ronchi noted.  Neurological: Alert and oriented. Psychiatric: Mood and affect normal. Behavior is normal. Judgment and thought content normal.  BMET    Component Value Date/Time   NA 136 08/12/2014 1208   K 4.9 08/12/2014 1208   CL 96 08/12/2014 1208   CO2 33* 08/12/2014 1208   GLUCOSE 106* 08/12/2014 1208   BUN 21 08/12/2014 1208   CREATININE 0.65 08/12/2014 1208   CALCIUM 9.6 08/12/2014 1208   GFRNONAA >90 08/04/2014 0418   GFRAA >90 08/04/2014 0418    Lipid Panel     Component Value Date/Time   CHOL 185 12/13/2012 0756   TRIG 152.0* 12/13/2012 0756   HDL 34.70* 12/13/2012 0756   CHOLHDL 5 12/13/2012 0756   VLDL 30.4 12/13/2012 0756    LDLCALC 120* 12/13/2012 0756    CBC    Component Value Date/Time   WBC 9.1 08/03/2014 0425   RBC 5.80* 08/03/2014 0425   HGB 14.2 08/03/2014 0425   HCT 49.1* 08/03/2014 0425   PLT 160 08/03/2014 0425   MCV 84.7 08/03/2014 0425  MCH 24.5* 08/03/2014 0425   MCHC 28.9* 08/03/2014 0425   RDW 18.6* 08/03/2014 0425   LYMPHSABS 0.2* 07/30/2014 0423   MONOABS 0.4 07/30/2014 0423   EOSABS 0.0 07/30/2014 0423   BASOSABS 0.0 07/30/2014 0423    Hgb A1C Lab Results  Component Value Date   HGBA1C 6.2* 07/25/2014        Assessment & Plan:   Edema, shortness of breath secondary to acute on chronic CHF:  Hospital notes, labs, images reviewed Hospital followup with Dr. [name] reviewed Advised her to continue lasix, HCTZ and potassium supplement Will add a low dose lasix 20 mg daily prn for > 3 lbs weight gain in a day Advised her the importance of following up with cardiology next week  RTC as needed or for you scheduled follow up with Dr. [name]

## 2014-08-21 NOTE — Telephone Encounter (Signed)
Sherre Poot RN with [facility] sent note and pt has appt today at 4:15 with Webb Silversmith NP but Sherre Poot wanted to know if pt could take extra lasix prior to appt. Windy request cb to pt. Also see 08/20/14 phone note. Sherre Poot said was OK to wait on Webb Silversmith NP return to office today at 1 pm.

## 2014-08-21 NOTE — Telephone Encounter (Signed)
Spoke with pt and advised pt per instructions OK to take an extra lasix prior to appt today at 4:15 pm. Pt said she would take an extra lasix now and pt does plan to keep appt this afternoon. Pt will cb prior to appt if condition changes or worsens.

## 2014-08-21 NOTE — Telephone Encounter (Signed)
Ok to take an extra lasix prior to appt

## 2014-08-21 NOTE — Patient Instructions (Signed)

## 2014-08-21 NOTE — Progress Notes (Signed)
Pre visit review using our clinic review tool, if applicable. No additional management support is needed unless otherwise documented below in the visit note. 

## 2014-08-21 NOTE — Telephone Encounter (Signed)
[facility] Patient Name: Deborah [name] DOB: 1955-08-26 Initial Comment Caller states her feet, ankles, hands are swollen. She has been taking meds. Nurse Assessment Nurse: Markus Daft, RN, Sherre Poot Date/Time (Eastern Time): 08/21/2014 10:28:06 AM Confirm and document reason for call. If symptomatic, describe symptoms. ---Caller states her feet, ankles into the legs, and also hands are swollen. Worse as the day progresses. She has been taking diuretics, Lasix 40 mg one daily. Has CHF and recently in hospital, discharged 2 wks. ago. She has 3 lb weight gain that started on Wednesday. Weight today = 216 lb. C/O dyspnea on exertion like with cleaning the kitchen. Urinated twice from 7 pm - 1 am., and usually voiding more often than this. Has the patient traveled out of the country within the last 30 days? ---Not Applicable Does the patient require triage? ---Yes Related visit to physician within the last 2 weeks? ---Yes Does the PT have any chronic conditions? (i.e. diabetes, asthma, etc.) ---Yes List chronic conditions. ---CHF, HTN Guidelines Guideline Title Affirmed Question Affirmed Notes Heart Failure Post-Hospitalization Followup Call [1] Thigh, calf, or ankle swelling AND [2] bilateral AND [3] 1 side is more swollen left shin > right shin Final Disposition User See Physician within 4 Hours (or PCP triage) Markus Daft, RN, Kenton Kingfisher would like someone to call her back today.  Can she take an extra lasix?

## 2014-08-24 NOTE — Telephone Encounter (Signed)
Spoke to pt who states that the swelling has gone down but she did not take any additional lasix

## 2014-08-24 NOTE — Telephone Encounter (Signed)
Please call to check on pt. 

## 2014-08-24 NOTE — Telephone Encounter (Signed)
Lm on pts vm requesting a call back 

## 2014-08-24 NOTE — Telephone Encounter (Signed)
I am happy to hear that the swelling has gone down.  Please continue to keep [place] updated.

## 2014-08-26 ENCOUNTER — Other Ambulatory Visit (INDEPENDENT_AMBULATORY_CARE_PROVIDER_SITE_OTHER): Payer: 59

## 2014-08-26 ENCOUNTER — Encounter: Payer: Self-pay | Admitting: Pulmonary Disease

## 2014-08-26 ENCOUNTER — Ambulatory Visit (INDEPENDENT_AMBULATORY_CARE_PROVIDER_SITE_OTHER): Payer: 59 | Admitting: Pulmonary Disease

## 2014-08-26 ENCOUNTER — Encounter: Payer: Self-pay | Admitting: *Deleted

## 2014-08-26 VITALS — BP 122/88 | HR 107 | Ht 64.0 in | Wt 216.4 lb

## 2014-08-26 DIAGNOSIS — J9612 Chronic respiratory failure with hypercapnia: Secondary | ICD-10-CM

## 2014-08-26 DIAGNOSIS — K7689 Other specified diseases of liver: Secondary | ICD-10-CM

## 2014-08-26 DIAGNOSIS — R945 Abnormal results of liver function studies: Secondary | ICD-10-CM

## 2014-08-26 DIAGNOSIS — G4733 Obstructive sleep apnea (adult) (pediatric): Secondary | ICD-10-CM

## 2014-08-26 LAB — HEPATIC FUNCTION PANEL
ALT: 19 U/L (ref 0–35)
AST: 16 U/L (ref 0–37)
Albumin: 3.5 g/dL (ref 3.5–5.2)
Alkaline Phosphatase: 71 U/L (ref 39–117)
Bilirubin, Direct: 0.1 mg/dL (ref 0.0–0.3)
Total Bilirubin: 0.5 mg/dL (ref 0.2–1.2)
Total Protein: 7.4 g/dL (ref 6.0–8.3)

## 2014-08-26 NOTE — Assessment & Plan Note (Signed)
Check oxygen levels SLeep study - if delay in obtaining this, we will get you a better fitting mask CPAP download from [facility]   Given excessive daytime somnolence, narrow pharyngeal exam, witnessed apneas & loud snoring, obstructive sleep apnea is very likely & an overnight polysomnogram will be scheduled as a split study. The pathophysiology of obstructive sleep apnea , it's cardiovascular consequences & modes of treatment including CPAP were discused with the patient in detail & they evidenced understanding.   Weight loss encouraged, compliance with goal of at least 4-6 hrs every night is the expectation. Advised against medications with sedative side effects Cautioned against driving when sleepy - understanding that sleepiness will vary on a day to day basis

## 2014-08-26 NOTE — Patient Instructions (Signed)
Check oxygen levels SLeep study - if delay in obtaining this, we will get you a better fitting mask CPAP download from [facility]

## 2014-08-26 NOTE — Assessment & Plan Note (Signed)
Okay to use 2 L of oxygen at all times. Reassess on next visit

## 2014-08-26 NOTE — Progress Notes (Signed)
Subjective:    Patient ID: Deborah [name], female    DOB: 1956/06/30, 59 y.o.   MRN: [id number]  HPI  74 yof with PMH of morbid obesity, mild COPD, OSA adm 1/9- 08/04/13  with hypercarbic, hypoxic resp failure due to volume overload from previously undx CHF.  She smoked up to 3 packs per day for 43 years before quitting in 2015. She developed desat post op from umbilical hernia surgery placed on 02 at 2lpm and referred by Dr [name] 04/16/13 to pulmonary clinic ([facility])  SIGNIFICANT EVENTS / STUDIES:  05/2013 PFTs- normal ratio, moderate restriction, FVC 54%, FEV1 56%, small airways with significant bronchodilator response, DLCO 60% 1/10 ECHO >> nml LV systolic fxn, dilated hypocontractile RV c/w cor pulmonale 1/12 VQ negative for PE.  1/13 ACE inhibitor changed to ARB and steriods increased yesterday and now wheezing better.  1/15 Weaned to [employer]   Chief Complaint  Patient presents with  . Sleep Consult    Referred by Hospital.  Pt unsure why they sent her, she thinks maybe she had trouble sleeping while at hospital.  Epworth Score: 7    He was discharged on oxygen, used BiPap during hospital stay and was provided a machine on discharge. She does not like the fullface mask and has problems putting the straps on, in spite of her husband's help. Epworth sleepiness score is 7 Bedtime is around midnight, sleep latency minimal, sleeps on her right side with one pillow, 4 nocturnal awakenings including nocturia and out of bed by 6:30 AM feeling tired, with dryness of mouth. She lost 40 pounds during hospital admission with diuretics  She-desaturates to 87% on room air and was able to maintain sats up to 90% on 2 L of oxygen.  Past Medical History  Diagnosis Date  . Umbilical hernia   . Asthma   . Anxiety   . Depression   . Shortness of breath   . Pneumonia     hx of  . Arthritis   . Anemia     as teen  . PONV (postoperative nausea and vomiting)   . Family history of  anesthesia complication     vomiting  . Histoplasmosis     left eye  . Hypertension   . Restrictive lung disease   . COPD, mild   . Heart failure     New onset 07/25/14  . Obesity (BMI 30-39.9)   . Tobacco abuse   . Hyperkalemia     Past Surgical History  Procedure Laterality Date  . Cesarean section  04/12/85  . Ventral hernia repair N/A 03/19/2013    Procedure:  OPEN VENTRAL HERNIA REPAIR WITH MESH AND APPLICATION OF WOUND VAC;  Surgeon: Madilyn Hook, DO;  Location: [facility];  Service: General;  Laterality: N/A;  . Insertion of mesh N/A 03/19/2013    Procedure: INSERTION OF MESH;  Surgeon: Madilyn Hook, DO;  Location: [facility];  Service: General;  Laterality: N/A;  . Application of wound vac  03/19/2013    Procedure: APPLICATION OF WOUND VAC;  Surgeon: Madilyn Hook, DO;  Location: [facility];  Service: General;;    No Known Allergies  History   Social History  . Marital Status: Married    Spouse Name: N/A  . Number of Children: 1  . Years of Education: N/A   Occupational History  . Retired    Social History Main Topics  . Smoking status: Former Research scientist (life sciences)  . Smokeless tobacco: Former Leisure centre manager  date: 07/24/2014     Comment: quit x 1 month ago 07/2014  . Alcohol Use: No  . Drug Use: No  . Sexual Activity: Not on file   Other Topics Concern  . Not on file   Social History Narrative   ** Merged History Encounter **       ** Data from: 07/25/14 Enc Dept: [facility]       ** Data from: 06/05/13 Enc Dept: [facility]   Married, one son 54 yo.             Family History  Problem Relation Age of Onset  . Heart attack Mother   . Emphysema Mother     was a smoker  . Lymphoma Maternal Uncle   . Pancreatic cancer Maternal Aunt   . Emphysema      Mother  . Heart failure      Father  . Cancer      Unknown    Review of Systems  Constitutional: Negative for fever and unexpected weight change.  HENT: Positive for postnasal drip. Negative for congestion, dental  problem, ear pain, nosebleeds, rhinorrhea, sinus pressure, sneezing, sore throat and trouble swallowing.   Eyes: Positive for redness and itching.  Respiratory: Positive for choking and shortness of breath. Negative for cough, chest tightness and wheezing.   Cardiovascular: Positive for leg swelling. Negative for palpitations.  Gastrointestinal: Negative for nausea and vomiting.  Genitourinary: Negative for dysuria.  Musculoskeletal: Negative for joint swelling.  Skin: Negative for rash.  Neurological: Negative for headaches.  Hematological: Does not bruise/bleed easily.  Psychiatric/Behavioral: Negative for dysphoric mood. The patient is not nervous/anxious.        Objective:   Physical Exam  Gen. Pleasant, obese, in no distress, normal affect ENT - no lesions, no post nasal drip, class 2-3 airway Neck: No JVD, no thyromegaly, no carotid bruits Lungs: no use of accessory muscles, no dullness to percussion, decreased without rales or rhonchi  Cardiovascular: Rhythm regular, heart sounds  normal, no murmurs or gallops, no peripheral edema Abdomen: soft and non-tender, no hepatosplenomegaly, BS normal. Musculoskeletal: No deformities, no cyanosis or clubbing Neuro:  alert, non focal, no tremors        Assessment & Plan:

## 2014-08-28 ENCOUNTER — Other Ambulatory Visit: Payer: Self-pay

## 2014-09-01 ENCOUNTER — Ambulatory Visit (INDEPENDENT_AMBULATORY_CARE_PROVIDER_SITE_OTHER): Payer: 59 | Admitting: Cardiovascular Disease

## 2014-09-01 ENCOUNTER — Encounter: Payer: Self-pay | Admitting: Cardiovascular Disease

## 2014-09-01 VITALS — BP 104/70 | HR 103 | Ht 64.0 in | Wt 218.2 lb

## 2014-09-01 DIAGNOSIS — I5032 Chronic diastolic (congestive) heart failure: Secondary | ICD-10-CM

## 2014-09-01 DIAGNOSIS — R0602 Shortness of breath: Secondary | ICD-10-CM

## 2014-09-01 DIAGNOSIS — I10 Essential (primary) hypertension: Secondary | ICD-10-CM

## 2014-09-01 NOTE — Progress Notes (Signed)
HPI  Deborah [name] is a pleasant 59 y/o female who is here today for a follow-up visit regarding chronic systolic heart failure. She has known history of tobacco use with COPD, essential Htn, Morbid obesity, Body mass index is 45.12 kg/(m^2) and bipolar disorder.  She was hospitalized in January at [facility] with worsening dyspnea. She was found to be in heart failure and was started on furosemide. She had moderate size left pleural effusion. Echocardiogram showed an ejection fraction of 56-38%, grade 1 diastolic dysfunction, dilated right ventricle with hypokinesis and mild pulmonary hypertension.   She gradually improved with combination of diuretics/bronchodilators/oxygen supplementation/systemic steroids/BiPAP.  She was discharged home on furosemide 40 mg once daily. She was already on hydrochlorothiazide 12.5 mg once daily. She was also placed on small dose metoprolol and losartan. She has been doing reasonably well since hospital discharge and has been paying attention to low sodium diet. She complains of mild dizziness but there has been no syncope or presyncope.  No Known Allergies   Current Outpatient Prescriptions on File Prior to Visit  Medication Sig Dispense Refill  . albuterol (PROVENTIL) (2.5 MG/3ML) 0.083% nebulizer solution Take 3 mLs (2.5 mg total) by nebulization every 3 (three) hours as needed for wheezing or shortness of breath. 75 mL 12  . aspirin 81 MG chewable tablet Chew 1 tablet (81 mg total) by mouth daily. 30 tablet 1  . fexofenadine (ALLEGRA) 30 MG/5ML suspension Take 5 mLs (30 mg total) by mouth daily. 300 mL 12  . furosemide (LASIX) 40 MG tablet Take 1 tablet (40 mg total) by mouth daily. 30 tablet 0  . ipratropium-albuterol (DUONEB) 0.5-2.5 (3) MG/3ML SOLN Take 3 mLs by nebulization every 6 (six) hours. 360 mL 0  . levalbuterol (XOPENEX) 0.31 MG/3ML nebulizer solution Take 3 mLs (0.31 mg total) by nebulization every 4 (four) hours as needed for wheezing.  3 mL 12  . losartan (COZAAR) 50 MG tablet Take 1 tablet (50 mg total) by mouth daily. 30 tablet 0  . metoprolol tartrate (LOPRESSOR) 25 MG tablet Take 0.5 tablets (12.5 mg total) by mouth 2 (two) times daily. 60 tablet 0  . montelukast (SINGULAIR) 10 MG tablet Take 1 tablet (10 mg total) by mouth at bedtime. 30 tablet 0  . potassium chloride 20 MEQ TBCR Take 10 mEq by mouth daily. 30 tablet 0  . tobramycin (TOBREX) 0.3 % ophthalmic solution Place 2 drops into the left eye every 6 (six) hours. 5 mL 0   No current facility-administered medications on file prior to visit.     Past Medical History  Diagnosis Date  . Umbilical hernia   . Asthma   . Anxiety   . Depression   . Shortness of breath   . Pneumonia     hx of  . Arthritis   . Anemia     as teen  . PONV (postoperative nausea and vomiting)   . Family history of anesthesia complication     vomiting  . Histoplasmosis     left eye  . Hypertension   . Restrictive lung disease   . COPD, mild   . Heart failure     New onset 07/25/14  . Obesity (BMI 30-39.9)   . Tobacco abuse   . Hyperkalemia   . CHF (congestive heart failure)      Past Surgical History  Procedure Laterality Date  . Cesarean section  04/12/85  . Ventral hernia repair N/A 03/19/2013    Procedure:  OPEN  VENTRAL HERNIA REPAIR WITH MESH AND APPLICATION OF WOUND VAC;  Surgeon: Madilyn Hook, DO;  Location: [facility];  Service: General;  Laterality: N/A;  . Insertion of mesh N/A 03/19/2013    Procedure: INSERTION OF MESH;  Surgeon: Madilyn Hook, DO;  Location: [facility];  Service: General;  Laterality: N/A;  . Application of wound vac  03/19/2013    Procedure: APPLICATION OF WOUND VAC;  Surgeon: Madilyn Hook, DO;  Location: [facility];  Service: General;;     Family History  Problem Relation Age of Onset  . Heart attack Mother   . Emphysema Mother     was a smoker  . Lymphoma Maternal Uncle   . Pancreatic cancer Maternal Aunt   . Emphysema      Mother  . Heart failure       Father  . Cancer      Unknown     History   Social History  . Marital Status: Married    Spouse Name: N/A  . Number of Children: 1  . Years of Education: N/A   Occupational History  . Retired    Social History Main Topics  . Smoking status: Former Research scientist (life sciences)  . Smokeless tobacco: Former Systems developer    Quit date: 07/24/2014     Comment: quit x 1 month ago 07/2014  . Alcohol Use: No  . Drug Use: No  . Sexual Activity: Not on file   Other Topics Concern  . Not on file   Social History Narrative   ** Merged History Encounter **       ** Data from: 07/25/14 Enc Dept: [facility]       ** Data from: 06/05/13 Enc Dept: [facility]   Married, one son 27 yo.               PHYSICAL EXAM   BP 104/70 mmHg  Pulse 103  Ht 5\' 4"  (1.626 m)  Wt 218 lb 4 oz (98.998 kg)  BMI 37.44 kg/m2 Constitutional: She is oriented to person, place, and time. She appears well-developed and well-nourished. No distress.  HENT: No nasal discharge.  Head: Normocephalic and atraumatic.  Eyes: Pupils are equal and round. No discharge.  Neck: Normal range of motion. Neck supple. No JVD present. No thyromegaly present.  Cardiovascular: Normal rate, regular rhythm, normal heart sounds. Exam reveals no gallop and no friction rub. No murmur heard.  Pulmonary/Chest: Effort normal and diminished breath sounds. No stridor. No respiratory distress. She has no wheezes. She has no rales. She exhibits no tenderness.  Abdominal: Soft. Bowel sounds are normal. She exhibits no distension. There is no tenderness. There is no rebound and no guarding.  Musculoskeletal: Normal range of motion. She exhibits no edema and no tenderness.  Neurological: She is alert and oriented to person, place, and time. Coordination normal.  Skin: Skin is warm and dry. No rash noted. She is not diaphoretic. No erythema. No pallor.  Psychiatric: She has a normal mood and affect. Her behavior is normal. Judgment and thought  content normal.     ZJI:RCVEL  Tachycardia  Low voltage in precordial leads.   ABNORMAL    ASSESSMENT AND PLAN

## 2014-09-01 NOTE — Patient Instructions (Signed)
Your physician has recommended you make the following change in your medication:  1) Stop hydrochlorothiazide (HCTZ)  Your physician recommends that you schedule a follow-up appointment in: 3 months with Dr. Fletcher Anon.

## 2014-09-01 NOTE — Assessment & Plan Note (Signed)
The patient has diastolic heart failure as well as right sided heart failure due to lung disease. Currently, there is no evidence of fluid overload. Given that she is on furosemide, I discontinued hydrochlorothiazide to avoid overdiuresis and hypokalemia. Blood pressure is somewhat on the low side which should improve after stopping hydrochlorothiazide. If it doesn't, losartan can be decreased. Given that her ejection fraction is relatively well preserved, we don't need to bring the blood pressure too low.

## 2014-09-01 NOTE — Assessment & Plan Note (Signed)
Blood pressure is slightly low today. Hydrochlorothiazide was discontinued.

## 2014-09-02 ENCOUNTER — Other Ambulatory Visit: Payer: Self-pay | Admitting: *Deleted

## 2014-09-02 MED ORDER — METOPROLOL TARTRATE 25 MG PO TABS: 12.5000 mg | ORAL_TABLET | Freq: Two times a day (BID) | ORAL | Status: DC

## 2014-09-02 MED ORDER — FUROSEMIDE 40 MG PO TABS: 40.0000 mg | ORAL_TABLET | Freq: Every day | ORAL | Status: DC

## 2014-09-02 MED ORDER — LOSARTAN POTASSIUM 50 MG PO TABS: 50.0000 mg | ORAL_TABLET | Freq: Every day | ORAL | Status: DC

## 2014-09-02 MED ORDER — MONTELUKAST SODIUM 10 MG PO TABS: 10.0000 mg | ORAL_TABLET | Freq: Every day | ORAL | Status: DC

## 2014-09-02 NOTE — Telephone Encounter (Signed)
Was seen by Dr. Fletcher Anon, meds confirmed. She will need f/u with Dr. [name] in 3 months

## 2014-09-02 NOTE — Telephone Encounter (Signed)
Last f/u appt 07/2014. Prior to 2016, pt had not been seen since 2014. pls advise

## 2014-09-03 NOTE — Telephone Encounter (Signed)
pt request status of refills requested by [facility]; advised was refilled electronically today; pt will ck with pharmacy.

## 2014-09-06 ENCOUNTER — Ambulatory Visit (HOSPITAL_BASED_OUTPATIENT_CLINIC_OR_DEPARTMENT_OTHER): Payer: 59 | Attending: Pulmonary Disease | Admitting: Radiology

## 2014-09-06 VITALS — Ht 64.0 in | Wt 216.0 lb

## 2014-09-06 DIAGNOSIS — G4733 Obstructive sleep apnea (adult) (pediatric): Secondary | ICD-10-CM | POA: Diagnosis not present

## 2014-09-07 ENCOUNTER — Telehealth: Payer: Self-pay | Admitting: Pulmonary Disease

## 2014-09-07 DIAGNOSIS — G4730 Sleep apnea, unspecified: Secondary | ICD-10-CM

## 2014-09-07 NOTE — Telephone Encounter (Signed)
Patient needs OV with Dr. Elsworth Soho to discuss Sleep Study results.  Patient already on schedule to see TP on 09/24/14.  Patient advised to keep appointment unless Dr. Elsworth Soho says otherwise.  Sent message to Dr. Elsworth Soho asking him if ok for her to see TP on that date to go over Sleep Study results.  If RA decides to change appointment, I will call patient back to change appointment.  For now, patient will be seeing TP on 09/24/14.  Nothing further needed.

## 2014-09-07 NOTE — Sleep Study (Signed)
[place]   NAME: Deborah [name] OF BIRTH: 1955-10-24  MEDICAL RECORD [id number]  LOCATION: [facility]   PHYSICIAN: ALVA,RAKESH V.   DATE OF STUDY: 09/06/14   SLEEP STUDY TYPE: Nocturnal Polysomnogram   REFERRING PHYSICIAN: Rigoberto Noel, MD   INDICATION FOR STUDY:  59 year old with morbid obesity, mild COPD and hospital admission in 07/2013 for hypercarbic respiratory failure. At the time of this study ,they weighed 216 pounds with a height of 5 ft 4 inches and the BMI of 37, neck size of 17 inches. Epworth sleepiness score was 9   This nocturnal polysomnogram was performed with a sleep technologist in attendance. EEG, EOG,EMG and respiratory parameters recorded. Sleep stages, arousals, limb movements and respiratory data was scored according to criteria laid out by the American Academy of sleep medicine.   SLEEP ARCHITECTURE: Lights out was at 2250 PM and lights on was at 503 AM. Total sleep time was 274 minutes with a sleep period time of 353 minutes and a sleep efficiency of 74 %. Sleep latency was 19 minutes with latency to REM sleep of 184 minutes and wake after sleep onset of 79 minutes. . Sleep stages as a percentage of total sleep time was N1 -6.6 %,N2- 74 % and REM sleep 14.6 % ( 40 minutes) . The longest period of REM sleep was around 2 AM.   AROUSAL DATA : There were 222  arousals with an arousal index of 49 events per hour. Most of these were spontaneous & 7 were associated with respiratory events  RESPIRATORY DATA: There were 21 obstructive apneas, 0 central apneas, 0 mixed apneas and 10 hypopneas with apnea -hypopnea index of 6.8 events per hour. There were 6 RERAs with an RDI of 8.1 events per hour. All the events were noted in supine REM sleep.  MOVEMENT/PARASOMNIA: There were 108 PLMS with a PLM index of 24 events per hour. The PLM arousal index was 8.5 per hour.  OXYGEN DATA: The lowest desaturation was 82 %  during REM sleep and the desaturation index was 10 per hour.   CARDIAC DATA: The low heart rate was 36 beats per minute. The high heart rate recorded was an artifact. No arrhythmias were noted   DISCUSSION -Loud snoring was noted . She did not meet criteria for CPAP intervention. She was desensitized with a small fullface mask  IMPRESSION :  1. Mild obstructive sleep apnea with hypopneas causing sleep fragmentation and moderate oxygen desaturation. All the events were noted during supine REM sleep 2. No evidence of cardiac arrhythmias or behavioral disturbance during sleep. PLM's were associated with arousals 3. Sleep efficiency was moderately for  RECOMMENDATION:  1. Treatment options for this degree of sleep disordered breathing include weight loss, CPAP therapy and/ or oral appliance.  2. Patient should be cautioned against driving when sleepy  3. They should be asked to avoid medications with sedative side effects    Rigoberto Noel MD Diplomate, American Board of Sleep Medicine    ELECTRONICALLY SIGNED ON: 09/07/2014  [facility]  PH: [phone] FX: [phone]  [place]

## 2014-09-07 NOTE — Telephone Encounter (Signed)
Sharyn Lull can you please advise. I don't see where she was called. Thanks.

## 2014-09-15 ENCOUNTER — Ambulatory Visit: Payer: 59 | Admitting: Family

## 2014-09-21 ENCOUNTER — Other Ambulatory Visit: Payer: Self-pay | Admitting: Family Medicine

## 2014-09-22 NOTE — Telephone Encounter (Signed)
i looked into past refills and both lasix and K+ have been prescribed by [facility]

## 2014-09-22 NOTE — Telephone Encounter (Signed)
Is pt needing to continue K+ Rx? Last lab 07/2014.

## 2014-09-22 NOTE — Telephone Encounter (Signed)
I think cardiology has been managing her heart failure and therefore they are refilling her lasix and potassium right?

## 2014-09-24 ENCOUNTER — Encounter: Payer: Self-pay | Admitting: Adult Health

## 2014-09-24 ENCOUNTER — Ambulatory Visit (INDEPENDENT_AMBULATORY_CARE_PROVIDER_SITE_OTHER): Payer: 59 | Admitting: Adult Health

## 2014-09-24 ENCOUNTER — Ambulatory Visit: Payer: Self-pay | Admitting: Adult Health

## 2014-09-24 VITALS — BP 118/80 | HR 88 | Temp 97.5°F | Ht 64.0 in | Wt 219.0 lb

## 2014-09-24 DIAGNOSIS — G4733 Obstructive sleep apnea (adult) (pediatric): Secondary | ICD-10-CM

## 2014-09-24 NOTE — Progress Notes (Signed)
Subjective:    Patient ID: Deborah [name], female    DOB: August 07, 1955, 59 y.o.   MRN: [id number]  HPI  55 yof with PMH of morbid obesity, mild COPD, OSA adm 1/9- 08/04/13  with hypercarbic, hypoxic resp failure due to volume overload from previously undx CHF.  She smoked up to 3 packs per day for 43 years before quitting in 2015. She developed desat post op from umbilical hernia surgery placed on 02 at 2lpm and referred by Dr [name] 04/16/13 to pulmonary clinic ([facility])  SIGNIFICANT EVENTS / STUDIES:  05/2013 PFTs- normal ratio, moderate restriction, FVC 54%, FEV1 56%, small airways with significant bronchodilator response, DLCO 60% 1/10 ECHO >> nml LV systolic fxn, dilated hypocontractile RV c/w cor pulmonale 1/12 VQ negative for PE.  1/13 ACE inhibitor changed to ARB and steriods increased yesterday and now wheezing better.  1/15 Weaned to [facility] [place]  Chief Complaint  Patient presents with  . Sleep Consult    Referred by Hospital.  Pt unsure why they sent her, she thinks maybe she had trouble sleeping while at hospital.  Epworth Score: 7    He was discharged on oxygen, used BiPap during hospital stay and was provided a machine on discharge. She does not like the fullface mask and has problems putting the straps on, in spite of her husband's help. Epworth sleepiness score is 7 Bedtime is around midnight, sleep latency minimal, sleeps on her right side with one pillow, 4 nocturnal awakenings including nocturia and out of bed by 6:30 AM feeling tired, with dryness of mouth. She lost 40 pounds during hospital admission with diuretics  She-desaturates to 87% on room air and was able to maintain sats up to 90% on 2 L of oxygen.   09/24/2014 Follow up sleep study  Pt returns for follow up for sleep study.  09/06/14 with Sleep study on 2l/m o2 showed AHI 6.8 , lowest sat 82%. , no central events  Says she is trying to wear most nights for 4hrs .  Says she got new mask, feels much better.    Does feel CPAP is helping, not as tired.  We discussed her study results  Pt education on OSA.      Review of Systems Constitutional:   No  weight loss, night sweats,  Fevers, chills, + fatigue, or  lassitude.  HEENT:   No headaches,  Difficulty swallowing,  Tooth/dental problems, or  Sore throat,                No sneezing, itching, ear ache, nasal congestion, post nasal drip,   CV:  No chest pain,  Orthopnea, PND, +swelling in lower extremities, anasarca, dizziness, palpitations, syncope.   GI  No heartburn, indigestion, abdominal pain, nausea, vomiting, diarrhea, change in bowel habits, loss of appetite, bloody stools.   Resp: .  No chest wall deformity  Skin: no rash or lesions.  GU: no dysuria, change in color of urine, no urgency or frequency.  No flank pain, no hematuria   MS:  No joint pain or swelling.  No decreased range of motion.   Psych:  No change in mood or affect. No depression or anxiety.  No memory loss.         Objective:   Physical Exam GEN: A/Ox3; pleasant , NAD, morbidly obese   HEENT:  [place]/AT,  EACs-clear, TMs-wnl, NOSE-clear, THROAT-clear, no lesions, no postnasal drip or exudate noted.   NECK:  Supple w/ fair ROM; no JVD; normal  carotid impulses w/o bruits; no thyromegaly or nodules palpated; no lymphadenopathy.  RESP Decreased BS in bases w/o, wheezes/ rales/ or rhonchi.no accessory muscle use, no dullness to percussion  CARD:  RRR, no m/r/g  , tr  peripheral edema, pulses intact, no cyanosis or clubbing.  GI:   Soft & nt; nml bowel sounds; no organomegaly or masses detected.  Musco: Warm bil, no deformities or joint swelling noted.   Neuro: alert, no focal deficits noted.    Skin: Warm, no lesions or rashes         Assessment & Plan:

## 2014-09-24 NOTE — Patient Instructions (Signed)
Wear CPAP with oxygen  every night , goal is at least 4-6 hr  Do not drive if sleepy.  Work on [employer] loss  Wear Oxygen 2l/m .  follow up Dr. Elsworth Soho  In 3 months and As needed   Download in 1 month

## 2014-09-24 NOTE — Assessment & Plan Note (Signed)
OSA -sleep study on O2 shows AHI on 6.8 w/ low sat 82% Continue on CPAP w/ goal everynight with 4-6 hr No driving if sleepy Wt loss.  Download in 1 month  follow up 3 month

## 2014-09-28 NOTE — Progress Notes (Signed)
Reviewed & agree with plan  

## 2014-10-01 NOTE — Addendum Note (Signed)
Addended by: Parke Poisson E on: 10/01/2014 04:44 PM   Modules accepted: Orders

## 2014-10-05 ENCOUNTER — Other Ambulatory Visit: Payer: Self-pay

## 2014-10-05 MED ORDER — ASPIRIN 81 MG PO CHEW: 81.0000 mg | CHEWABLE_TABLET | Freq: Every day | ORAL | Status: AC

## 2014-10-05 NOTE — Telephone Encounter (Signed)
[facility] [address] left v/m requesting refill ASA 81 mg; last filled by Dr Nat Math.Please advise.

## 2014-10-27 ENCOUNTER — Ambulatory Visit (INDEPENDENT_AMBULATORY_CARE_PROVIDER_SITE_OTHER): Payer: 59 | Admitting: Family Medicine

## 2014-10-27 ENCOUNTER — Encounter: Payer: Self-pay | Admitting: Family Medicine

## 2014-10-27 VITALS — BP 132/66 | HR 117 | Temp 97.5°F | Wt 216.8 lb

## 2014-10-27 DIAGNOSIS — R5383 Other fatigue: Secondary | ICD-10-CM

## 2014-10-27 DIAGNOSIS — I5033 Acute on chronic diastolic (congestive) heart failure: Secondary | ICD-10-CM

## 2014-10-27 DIAGNOSIS — G4733 Obstructive sleep apnea (adult) (pediatric): Secondary | ICD-10-CM

## 2014-10-27 DIAGNOSIS — I5032 Chronic diastolic (congestive) heart failure: Secondary | ICD-10-CM | POA: Diagnosis not present

## 2014-10-27 LAB — CBC WITH DIFFERENTIAL/PLATELET
Basophils Absolute: 0.1 10*3/uL (ref 0.0–0.1)
Basophils Relative: 0.7 % (ref 0.0–3.0)
Eosinophils Absolute: 0.3 10*3/uL (ref 0.0–0.7)
Eosinophils Relative: 2.4 % (ref 0.0–5.0)
HCT: 42.1 % (ref 36.0–46.0)
Hemoglobin: 14 g/dL (ref 12.0–15.0)
Lymphocytes Relative: 28.2 % (ref 12.0–46.0)
Lymphs Abs: 3.2 10*3/uL (ref 0.7–4.0)
MCHC: 33.3 g/dL (ref 30.0–36.0)
MCV: 87.6 fl (ref 78.0–100.0)
Monocytes Absolute: 0.7 10*3/uL (ref 0.1–1.0)
Monocytes Relative: 6.6 % (ref 3.0–12.0)
Neutro Abs: 7 10*3/uL (ref 1.4–7.7)
Neutrophils Relative %: 62.1 % (ref 43.0–77.0)
Platelets: 297 10*3/uL (ref 150.0–400.0)
RBC: 4.8 Mil/uL (ref 3.87–5.11)
RDW: 18.5 % — ABNORMAL HIGH (ref 11.5–15.5)
WBC: 11.3 10*3/uL — ABNORMAL HIGH (ref 4.0–10.5)

## 2014-10-27 LAB — COMPREHENSIVE METABOLIC PANEL
ALT: 20 U/L (ref 0–35)
AST: 22 U/L (ref 0–37)
Albumin: 4 g/dL (ref 3.5–5.2)
Alkaline Phosphatase: 69 U/L (ref 39–117)
BUN: 24 mg/dL — ABNORMAL HIGH (ref 6–23)
CO2: 32 mEq/L (ref 19–32)
Calcium: 9.8 mg/dL (ref 8.4–10.5)
Chloride: 96 mEq/L (ref 96–112)
Creatinine, Ser: 1 mg/dL (ref 0.40–1.20)
GFR: 60.87 mL/min (ref >60.00)
Glucose, Bld: 95 mg/dL (ref 70–99)
Potassium: 3.6 mEq/L (ref 3.5–5.1)
Sodium: 136 mEq/L (ref 135–145)
Total Bilirubin: 0.2 mg/dL (ref 0.2–1.2)
Total Protein: 7.9 g/dL (ref 6.0–8.3)

## 2014-10-27 LAB — VITAMIN D 25 HYDROXY (VIT D DEFICIENCY, FRACTURES): VITD: 14.16 ng/mL — ABNORMAL LOW (ref 30.00–100.00)

## 2014-10-27 LAB — VITAMIN B12: Vitamin B-12: 296 pg/mL (ref 211–911)

## 2014-10-27 LAB — TSH: TSH: 2.12 u[IU]/mL (ref 0.35–4.50)

## 2014-10-27 NOTE — Progress Notes (Signed)
Subjective:   Patient ID: Deborah [name], female    DOB: 1956-03-21, 59 y.o.   MRN: [id number]  Deborah [name] is a pleasant 59 y.o. year old female who presents to clinic today with her husband for Follow-up  on 10/27/2014  HPI: Overall doing well.  Saw Dr. Fletcher Anon on 09/01/14- note reviewed.  HCTZ d/c'd given hypotension and hypokalemia. She feels swelling is much improved with lasix.  Does not have any CP or severe SOB.  Has been "extremely" fatigued over past few weeks.   Denies any other symptoms other than exhaustion.  She has not been as compliant with her CPAP over past few nights because she does not feel she sleeps as well with it on. Saw Tammy Parrett last month- 09/24/14- note reviewed.   Lab Results  Component Value Date   CREATININE 0.65 08/12/2014   Lab Results  Component Value Date   NA 136 08/12/2014   K 4.9 08/12/2014   CL 96 08/12/2014   CO2 33* 08/12/2014   Lab Results  Component Value Date   WBC 9.1 08/03/2014   HGB 14.2 08/03/2014   HCT 49.1* 08/03/2014   MCV 84.7 08/03/2014   PLT 160 08/03/2014   Lab Results  Component Value Date   TSH 2.289 07/25/2014   Current Outpatient Prescriptions on File Prior to Visit  Medication Sig Dispense Refill  . albuterol (PROVENTIL) (2.5 MG/3ML) 0.083% nebulizer solution Take 3 mLs (2.5 mg total) by nebulization every 3 (three) hours as needed for wheezing or shortness of breath. 75 mL 12  . aspirin 81 MG chewable tablet Chew 1 tablet (81 mg total) by mouth daily. 30 tablet 1  . fexofenadine (ALLEGRA) 30 MG/5ML suspension Take 5 mLs (30 mg total) by mouth daily. 300 mL 12  . furosemide (LASIX) 40 MG tablet Take 1 tablet (40 mg total) by mouth daily. 30 tablet 2  . ipratropium-albuterol (DUONEB) 0.5-2.5 (3) MG/3ML SOLN Take 3 mLs by nebulization every 6 (six) hours. 360 mL 0  . levalbuterol (XOPENEX) 0.31 MG/3ML nebulizer solution Take 3 mLs (0.31 mg total) by nebulization every 4 (four) hours as needed for  wheezing. 3 mL 12  . losartan (COZAAR) 50 MG tablet Take 1 tablet (50 mg total) by mouth daily. 30 tablet 2  . metoprolol tartrate (LOPRESSOR) 25 MG tablet Take 0.5 tablets (12.5 mg total) by mouth 2 (two) times daily. 60 tablet 2  . montelukast (SINGULAIR) 10 MG tablet Take 1 tablet (10 mg total) by mouth at bedtime. 30 tablet 2  . potassium chloride SA (K-DUR,KLOR-CON) 20 MEQ tablet TAKE 1/2 TABLET BY MOUTH DAILY 30 tablet 0   No current facility-administered medications on file prior to visit.    No Known Allergies  Past Medical History  Diagnosis Date  . Umbilical hernia   . Asthma   . Anxiety   . Depression   . Shortness of breath   . Pneumonia     hx of  . Arthritis   . Anemia     as teen  . PONV (postoperative nausea and vomiting)   . Family history of anesthesia complication     vomiting  . Histoplasmosis     left eye  . Hypertension   . Restrictive lung disease   . COPD, mild   . Heart failure     New onset 07/25/14  . Obesity (BMI 30-39.9)   . Tobacco abuse   . Hyperkalemia   . CHF (congestive heart failure)  Past Surgical History  Procedure Laterality Date  . Cesarean section  04/12/85  . Ventral hernia repair N/A 03/19/2013    Procedure:  OPEN VENTRAL HERNIA REPAIR WITH MESH AND APPLICATION OF WOUND VAC;  Surgeon: Madilyn Hook, DO;  Location: [facility];  Service: General;  Laterality: N/A;  . Insertion of mesh N/A 03/19/2013    Procedure: INSERTION OF MESH;  Surgeon: Madilyn Hook, DO;  Location: [facility];  Service: General;  Laterality: N/A;  . Application of wound vac  03/19/2013    Procedure: APPLICATION OF WOUND VAC;  Surgeon: Madilyn Hook, DO;  Location: [facility];  Service: General;;    Family History  Problem Relation Age of Onset  . Heart attack Mother   . Emphysema Mother     was a smoker  . Lymphoma Maternal Uncle   . Pancreatic cancer Maternal Aunt   . Emphysema      Mother  . Heart failure      Father  . Cancer      Unknown    History    Social History  . Marital Status: Married    Spouse Name: N/A  . Number of Children: 1  . Years of Education: N/A   Occupational History  . Retired    Social History Main Topics  . Smoking status: Former Smoker -- 3.00 packs/day for 43 years    Types: Cigarettes    Quit date: 12/17/2012  . Smokeless tobacco: Not on file  . Alcohol Use: No  . Drug Use: No  . Sexual Activity: Not on file   Other Topics Concern  . Not on file   Social History Narrative   ** Merged History Encounter **       ** Data from: 07/25/14 Enc Dept: [facility]       ** Data from: 06/05/13 Enc Dept: [facility]   Married, one son 14 yo.            The PMH, PSH, Social History, Family History, Medications, and allergies have been reviewed in [facility], and have been updated if relevant.   Review of Systems  Constitutional: Positive for fatigue. Negative for fever and diaphoresis.  HENT: Negative.   Respiratory: Negative.   Cardiovascular: Negative.   Gastrointestinal: Negative.   Endocrine: Negative.   Musculoskeletal: Negative.   Skin: Negative.   Neurological: Negative.   Hematological: Negative.   Psychiatric/Behavioral: Negative.   All other systems reviewed and are negative.      Objective:    BP 132/66 mmHg  Pulse 117  Temp(Src) 97.5 F (36.4 C) (Oral)  Wt 216 lb 12 oz (98.317 kg)  SpO2 96% Wt Readings from Last 3 Encounters:  10/27/14 216 lb 12 oz (98.317 kg)  09/24/14 219 lb (99.338 kg)  09/06/14 216 lb (97.977 kg)     Physical Exam  Constitutional: She is oriented to person, place, and time. She appears well-developed and well-nourished. No distress.  Wearing [place]  HENT:  Head: Normocephalic.  Eyes: Conjunctivae are normal.  Neck: Normal range of motion.  Cardiovascular:  tachycardia  Pulmonary/Chest: Effort normal. She has no wheezes.  Decreased BS at bases  Musculoskeletal: She exhibits no edema.  Neurological: She is alert and oriented to person,  place, and time. No cranial nerve deficit.  Skin: Skin is warm and dry.  Psychiatric: She has a normal mood and affect. Her behavior is normal. Judgment and thought content normal.          Assessment & Plan:  Acute on chronic diastolic congestive heart failure  Other fatigue - Plan: CBC with Differential/Platelet, TSH, Comprehensive metabolic panel, Vitamin A68, Vitamin D, 25-[id number]  Chronic diastolic heart failure  OSA (obstructive sleep apnea) No Follow-up on file.

## 2014-10-27 NOTE — Patient Instructions (Signed)
Good to see you. I will call you with your lab results.   

## 2014-10-27 NOTE — Progress Notes (Signed)
Pre visit review using our clinic review tool, if applicable. No additional management support is needed unless otherwise documented below in the visit note. 

## 2014-10-27 NOTE — Assessment & Plan Note (Signed)
Stable.  No signs of volume overload today.  Recheck electrolytes and renal function today.

## 2014-10-27 NOTE — Assessment & Plan Note (Signed)
Followed by pulmonary.  Should be using CPAP nightly.  Encouraged her to discuss this with pulmonary- discuss proper fitting and usage.

## 2014-10-27 NOTE — Assessment & Plan Note (Signed)
New- likely multifactorial, including inconsistent CPAP use. Check labs today to rule out other possible contributing factors. The patient indicates understanding of these issues and agrees with the plan.  Orders Placed This Encounter  Procedures  . CBC with Differential/Platelet  . TSH  . Comprehensive metabolic panel  . Vitamin B12  . Vitamin D, 25-hydroxy

## 2014-10-29 ENCOUNTER — Other Ambulatory Visit: Payer: Self-pay | Admitting: *Deleted

## 2014-11-02 MED ORDER — VITAMIN D (ERGOCALCIFEROL) 1.25 MG (50000 UNIT) PO CAPS: 50000.0000 [IU] | ORAL_CAPSULE | ORAL | Status: DC

## 2014-11-02 NOTE — Addendum Note (Signed)
Addended by: Modena Nunnery on: 11/02/2014 04:05 PM   Modules accepted: Orders

## 2014-11-15 ENCOUNTER — Other Ambulatory Visit: Payer: Self-pay | Admitting: Internal Medicine

## 2014-11-16 NOTE — Telephone Encounter (Signed)
It looks like the '40mg'$  was filled last--please advise

## 2014-11-16 NOTE — Telephone Encounter (Signed)
Isn't cardiology ( Dr. Fletcher Anon) managing this?

## 2014-11-26 ENCOUNTER — Other Ambulatory Visit: Payer: Self-pay | Admitting: Internal Medicine

## 2014-12-02 ENCOUNTER — Telehealth: Payer: Self-pay

## 2014-12-02 MED ORDER — POTASSIUM CHLORIDE CRYS ER 20 MEQ PO TBCR: 10.0000 meq | EXTENDED_RELEASE_TABLET | Freq: Every day | ORAL | Status: DC

## 2014-12-02 NOTE — Telephone Encounter (Signed)
Yes I think I Dr. Fletcher Anon will be following but ok to refill since we just did labs last month.

## 2014-12-02 NOTE — Telephone Encounter (Signed)
[facility] at [address] left v/m requesting refill Potassium; Is Dr Fletcher Anon following potassium? Pt has appt on 12/03/14 at 2:15 with Dr Fletcher Anon.Please advise.

## 2014-12-03 ENCOUNTER — Ambulatory Visit (INDEPENDENT_AMBULATORY_CARE_PROVIDER_SITE_OTHER): Payer: 59 | Admitting: Cardiovascular Disease

## 2014-12-03 ENCOUNTER — Encounter: Payer: Self-pay | Admitting: Cardiovascular Disease

## 2014-12-03 VITALS — BP 100/72 | HR 88 | Ht 63.0 in | Wt 220.8 lb

## 2014-12-03 DIAGNOSIS — I10 Essential (primary) hypertension: Secondary | ICD-10-CM

## 2014-12-03 DIAGNOSIS — I5032 Chronic diastolic (congestive) heart failure: Secondary | ICD-10-CM | POA: Diagnosis not present

## 2014-12-03 NOTE — Patient Instructions (Signed)
Medication Instructions:  None  Labwork: None  Testing/Procedures: None  Follow-Up: Your physician wants you to follow-up in: [place] with Dr. Fletcher Anon.  You will receive a reminder letter in the mail two months in advance. If you don't receive a letter, please call our office to schedule the follow-up appointment.   Any Other Special Instructions Will Be Listed Below (If Applicable).

## 2014-12-03 NOTE — Assessment & Plan Note (Signed)
Blood pressure is reasonably controlled on current medications. 

## 2014-12-03 NOTE — Assessment & Plan Note (Signed)
She appears to be doing reasonably well and seems to be euvolemic on current dose of furosemide. Her weight has been stable. Most of her symptoms seem to be related to COPD.

## 2014-12-03 NOTE — Progress Notes (Signed)
HPI  Deborah [name] is a pleasant 59 y/o female who is here today for a follow-up visit regarding chronic diastolic heart failure. She has known history of tobacco use with COPD, essential Htn, Morbid obesity, Body mass index is 45.12 kg/(m^2) and bipolar disorder.  She was hospitalized in January at [facility] with worsening dyspnea. She was found to be in heart failure and was started on furosemide. She had moderate size left pleural effusion. Echocardiogram showed an ejection fraction of 75-91%, grade 1 diastolic dysfunction, dilated right ventricle with hypokinesis and mild pulmonary hypertension.   She gradually improved with combination of diuretics/bronchodilators/oxygen supplementation/systemic steroids/BiPAP.  She has been doing reasonably well since her last visit but she did have worsening bronchitis that was treated with antibiotics. She feels better. She denies any chest pain. She has been monitoring her weight regularly which has been stable.  No Known Allergies   Current Outpatient Prescriptions on File Prior to Visit  Medication Sig Dispense Refill  . Artificial Tear Solution (SOOTHE XP OP) Apply 1 drop to eye daily.    Marland Kitchen aspirin 81 MG chewable tablet Chew 1 tablet (81 mg total) by mouth daily. 30 tablet 1  . carboxymethylcellulose (REFRESH PLUS) 0.5 % SOLN 1 drop 3 (three) times daily as needed.    . fexofenadine (ALLEGRA) 30 MG/5ML suspension Take 5 mLs (30 mg total) by mouth daily. 300 mL 12  . furosemide (LASIX) 20 MG tablet TAKE 1 TABLET BY MOUTH DAILY AS NEEDED. IF USE HAVE> 3 POUNDS WEIGHT GAIN IN 1 DAY 30 tablet 0  . furosemide (LASIX) 40 MG tablet TAKE 1 TABLET BY MOUTH EVERY DAY 90 tablet 0  . ipratropium-albuterol (DUONEB) 0.5-2.5 (3) MG/3ML SOLN Take 3 mLs by nebulization every 6 (six) hours. 360 mL 0  . losartan (COZAAR) 50 MG tablet TAKE 1 TABLET BY MOUTH EVERY DAY 90 tablet 0  . metoprolol tartrate (LOPRESSOR) 25 MG tablet TAKE 1/2 TABLET BY MOUTH TWICE  DAILY 180 tablet 2  . montelukast (SINGULAIR) 10 MG tablet TAKE 1 TABLET BY MOUTH EVERY NIGHT AT BEDTIME 90 tablet 0  . potassium chloride SA (K-DUR,KLOR-CON) 20 MEQ tablet Take 0.5 tablets (10 mEq total) by mouth daily. 30 tablet 0  . Vitamin D, Ergocalciferol, (DRISDOL) 50000 UNITS CAPS capsule Take 1 capsule (50,000 Units total) by mouth every 7 (seven) days. 6 capsule 0   No current facility-administered medications on file prior to visit.     Past Medical History  Diagnosis Date  . Umbilical hernia   . Asthma   . Anxiety   . Depression   . Shortness of breath   . Pneumonia     hx of  . Arthritis   . Anemia     as teen  . PONV (postoperative nausea and vomiting)   . Family history of anesthesia complication     vomiting  . Histoplasmosis     left eye  . Hypertension   . Restrictive lung disease   . COPD, mild   . Heart failure     New onset 07/25/14  . Obesity (BMI 30-39.9)   . Tobacco abuse   . Hyperkalemia   . CHF (congestive heart failure)      Past Surgical History  Procedure Laterality Date  . Cesarean section  04/12/85  . Ventral hernia repair N/A 03/19/2013    Procedure:  OPEN VENTRAL HERNIA REPAIR WITH MESH AND APPLICATION OF WOUND VAC;  Surgeon: Madilyn Hook, DO;  Location: [facility];  Service: General;  Laterality: N/A;  . Insertion of mesh N/A 03/19/2013    Procedure: INSERTION OF MESH;  Surgeon: Madilyn Hook, DO;  Location: [facility];  Service: General;  Laterality: N/A;  . Application of wound vac  03/19/2013    Procedure: APPLICATION OF WOUND VAC;  Surgeon: Madilyn Hook, DO;  Location: [facility];  Service: General;;     Family History  Problem Relation Age of Onset  . Heart attack Mother   . Emphysema Mother     was a smoker  . Lymphoma Maternal Uncle   . Pancreatic cancer Maternal Aunt   . Emphysema      Mother  . Heart failure      Father  . Cancer      Unknown     History   Social History  . Marital Status: Married    Spouse Name: N/A  .  Number of Children: 1  . Years of Education: N/A   Occupational History  . Retired    Social History Main Topics  . Smoking status: Former Smoker -- 3.00 packs/day for 43 years    Types: Cigarettes    Quit date: 12/17/2012  . Smokeless tobacco: Not on file  . Alcohol Use: No  . Drug Use: No  . Sexual Activity: Not on file   Other Topics Concern  . Not on file   Social History Narrative   ** Merged History Encounter **       ** Data from: 07/25/14 Enc Dept: [facility]       ** Data from: 06/05/13 Enc Dept: [facility]   Married, one son 59 yo.               PHYSICAL EXAM   BP 100/72 mmHg  Pulse 88  Ht '5\' 3"'$  (1.6 m)  Wt 220 lb 12 oz (100.132 kg)  BMI 39.11 kg/m2 Constitutional: She is oriented to person, place, and time. She appears well-developed and well-nourished. No distress.  HENT: No nasal discharge.  Head: Normocephalic and atraumatic.  Eyes: Pupils are equal and round. No discharge.  Neck: Normal range of motion. Neck supple. No JVD present. No thyromegaly present.  Cardiovascular: Normal rate, regular rhythm, normal heart sounds. Exam reveals no gallop and no friction rub. No murmur heard.  Pulmonary/Chest: Effort normal and diminished breath sounds. No stridor. No respiratory distress. She has no wheezes. She has no rales. She exhibits no tenderness.  Abdominal: Soft. Bowel sounds are normal. She exhibits no distension. There is no tenderness. There is no rebound and no guarding.  Musculoskeletal: Normal range of motion. She exhibits no edema and no tenderness.  Neurological: She is alert and oriented to person, place, and time. Coordination normal.  Skin: Skin is warm and dry. No rash noted. She is not diaphoretic. No erythema. No pallor.  Psychiatric: She has a normal mood and affect. Her behavior is normal. Judgment and thought content normal.      ASSESSMENT AND PLAN

## 2014-12-09 ENCOUNTER — Other Ambulatory Visit: Payer: Self-pay | Admitting: Family Medicine

## 2014-12-10 NOTE — Telephone Encounter (Signed)
Is pt needing repeat labs for refill?

## 2014-12-21 ENCOUNTER — Other Ambulatory Visit: Payer: Self-pay | Admitting: Family Medicine

## 2014-12-21 DIAGNOSIS — E559 Vitamin D deficiency, unspecified: Secondary | ICD-10-CM

## 2014-12-28 ENCOUNTER — Other Ambulatory Visit (INDEPENDENT_AMBULATORY_CARE_PROVIDER_SITE_OTHER): Payer: 59

## 2014-12-28 DIAGNOSIS — E559 Vitamin D deficiency, unspecified: Secondary | ICD-10-CM

## 2014-12-28 LAB — VITAMIN D 25 HYDROXY (VIT D DEFICIENCY, FRACTURES): VITD: 23.24 ng/mL — ABNORMAL LOW (ref 30.00–100.00)

## 2014-12-29 ENCOUNTER — Other Ambulatory Visit: Payer: Self-pay | Admitting: Family Medicine

## 2014-12-29 MED ORDER — VITAMIN D (ERGOCALCIFEROL) 1.25 MG (50000 UNIT) PO CAPS: ORAL_CAPSULE | ORAL | Status: DC

## 2014-12-29 NOTE — Addendum Note (Signed)
Addended by: Modena Nunnery on: 12/29/2014 10:13 AM   Modules accepted: Orders

## 2015-01-19 ENCOUNTER — Ambulatory Visit: Payer: Self-pay | Admitting: Pulmonary Disease

## 2015-01-25 ENCOUNTER — Other Ambulatory Visit: Payer: Self-pay | Admitting: Family Medicine

## 2015-01-25 ENCOUNTER — Other Ambulatory Visit: Payer: Self-pay | Admitting: Internal Medicine

## 2015-02-04 ENCOUNTER — Other Ambulatory Visit: Payer: Self-pay | Admitting: Family Medicine

## 2015-02-04 ENCOUNTER — Encounter: Payer: Self-pay | Admitting: Family Medicine

## 2015-02-04 ENCOUNTER — Telehealth: Payer: Self-pay

## 2015-02-04 NOTE — Telephone Encounter (Signed)
Called patient to notify her of being due for a mammogram. Patient declined any help scheduling one at the moment.

## 2015-02-11 ENCOUNTER — Ambulatory Visit: Payer: Self-pay | Admitting: Pulmonary Disease

## 2015-02-26 ENCOUNTER — Other Ambulatory Visit: Payer: Self-pay | Admitting: Family Medicine

## 2015-03-06 ENCOUNTER — Other Ambulatory Visit: Payer: Self-pay | Admitting: Family Medicine

## 2015-03-24 ENCOUNTER — Encounter: Payer: Self-pay | Admitting: Pulmonary Disease

## 2015-03-24 ENCOUNTER — Ambulatory Visit (INDEPENDENT_AMBULATORY_CARE_PROVIDER_SITE_OTHER): Payer: 59 | Admitting: Pulmonary Disease

## 2015-03-24 VITALS — BP 120/84 | HR 103 | Ht 63.0 in | Wt 230.8 lb

## 2015-03-24 DIAGNOSIS — G4733 Obstructive sleep apnea (adult) (pediatric): Secondary | ICD-10-CM

## 2015-03-24 DIAGNOSIS — J9611 Chronic respiratory failure with hypoxia: Secondary | ICD-10-CM | POA: Diagnosis not present

## 2015-03-24 NOTE — Progress Notes (Signed)
   Subjective:    Patient ID: Deborah [name], female    DOB: 07/07/1956, 59 y.o.   MRN: [id number]  HPI  54 yof with PMH of obesity, mild COPD, OSA adm 07/2013  with hypercarbic, hypoxic resp failure due to volume overload from previously undx CHF.  She smoked up to 3 packs per day for 43 years before quitting in 2015. She developed desat post op from umbilical hernia surgery placed on 02 at 2lpm and referred by Dr [name] 04/16/13 to pulmonary clinic ([facility])   03/24/2015  Chief Complaint  Patient presents with  . Follow-up    Patient doing well.  Still has cough.  Went to [place] during the holiday and was around a lot of smoke, made her breathing worse.   She was discharged on oxygen in 07/2013 and was provided a CPAP on discharge. She does not like the fullface mask and has problems putting the straps on, in spite of her husband's help. She is compliant with O2 but CPAP download 03/2015 shows poor usage only 3/30 days. She-desaturates to 88% on room air  On 3rd lap, HR increased to 124   SIGNIFICANT EVENTS / STUDIES:  05/2013 PFTs- normal ratio, moderate restriction, FVC 54%, FEV1 56%, small airways with significant bronchodilator response, DLCO 60% 1/10 ECHO >> nml LV systolic fxn, dilated hypocontractile RV c/w cor pulmonale 1/12 VQ negative for PE.  1/13 ACE inhibitor changed to ARB and steriods increased yesterday and now wheezing better.  1/15 Weaned to 6L [place] 08/2014 PSG - 216 lbs -AHI 7/h, lowest desatn 82%    Review of Systems neg for any significant sore throat, dysphagia, itching, sneezing, nasal congestion or excess/ purulent secretions, fever, chills, sweats, unintended wt loss, pleuritic or exertional cp, hempoptysis, orthopnea pnd or change in chronic leg swelling. Also denies presyncope, palpitations, heartburn, abdominal pain, nausea, vomiting, diarrhea or change in bowel or urinary habits, dysuria,hematuria, rash, arthralgias, visual complaints, headache, numbness  weakness or ataxia.     Objective:   Physical Exam  Gen. Pleasant, obese, in no distress ENT - no lesions, no post nasal drip Neck: No JVD, no thyromegaly, no carotid bruits Lungs: no use of accessory muscles, no dullness to percussion, decreased without rales or rhonchi  Cardiovascular: Rhythm regular, heart sounds  normal, no murmurs or gallops, no peripheral edema Musculoskeletal: No deformities, no cyanosis or clubbing , no tremors       Assessment & Plan:

## 2015-03-24 NOTE — Assessment & Plan Note (Addendum)
Trial of nasal pillows with chin strap  - if you are not able to use CPAP more than 4h/night - will discontinue Since AHI is low, this would be reasonable  Weight loss encouraged, compliance with goal of at least 4-6 hrs every night is the expectation. Advised against medications with sedative side effects Cautioned against driving when sleepy - understanding that sleepiness will vary on a day to day basis

## 2015-03-24 NOTE — Assessment & Plan Note (Signed)
Continue using oxygen during sleep & walking - Ok to stay off at rest during daytime Weight loss recommended

## 2015-03-24 NOTE — Patient Instructions (Signed)
Trial of nasal pillows with chin strap  - if you are not able to use CPAP more than 4h/night - will discontinue Continue using oxygen during sleep & walking - Ok to stay off at rest during daytime Weight loss recommended

## 2015-03-25 ENCOUNTER — Telehealth: Payer: Self-pay | Admitting: Pulmonary Disease

## 2015-03-25 DIAGNOSIS — G4733 Obstructive sleep apnea (adult) (pediatric): Secondary | ICD-10-CM

## 2015-03-25 NOTE — Telephone Encounter (Signed)
She will likely not tolerate- would like to see if she tolerates nasal pillows, if not discontinue CPAP

## 2015-03-25 NOTE — Telephone Encounter (Signed)
Attempted to contact Deborah [name] to find out if she wants to get a new CPAP machine, try the nasal pillows (which would cost her $150 out of pocket) or D/C CPAP use. LMTCB

## 2015-03-25 NOTE — Telephone Encounter (Signed)
Called and spoke to Jovista, she said that patient would have to pay out of pocket for nasal pillows.  She is checking on price of nasal pillows and will call back to let me know price before contacting patient.    Hold in my box until I hear back from [facility]

## 2015-03-25 NOTE — Telephone Encounter (Signed)
Called spoke with Melissa from [facility]. She reports she has the CPAP unit from d/c hosp visit. They need an order to get pt her own machine and bill through her insurance. Need to have settings on order as well. Please advise RA thanks

## 2015-03-25 NOTE — Telephone Encounter (Signed)
Melissa called. She wanted to let you know it is aprox $150.00. If you have questions you can call her at [phone]

## 2015-03-26 NOTE — Telephone Encounter (Signed)
Spoke with pt and she is willing to try the nasal pillows.  Dr Elsworth Soho, please advise what settings you would like pt to be on?

## 2015-03-29 ENCOUNTER — Encounter: Payer: Self-pay | Admitting: Family Medicine

## 2015-03-29 ENCOUNTER — Ambulatory Visit (INDEPENDENT_AMBULATORY_CARE_PROVIDER_SITE_OTHER): Payer: 59 | Admitting: Family Medicine

## 2015-03-29 VITALS — BP 130/62 | HR 104 | Temp 98.1°F | Wt 229.0 lb

## 2015-03-29 DIAGNOSIS — E559 Vitamin D deficiency, unspecified: Secondary | ICD-10-CM

## 2015-03-29 DIAGNOSIS — F319 Bipolar disorder, unspecified: Secondary | ICD-10-CM

## 2015-03-29 DIAGNOSIS — I10 Essential (primary) hypertension: Secondary | ICD-10-CM | POA: Diagnosis not present

## 2015-03-29 DIAGNOSIS — I5032 Chronic diastolic (congestive) heart failure: Secondary | ICD-10-CM | POA: Diagnosis not present

## 2015-03-29 DIAGNOSIS — G4733 Obstructive sleep apnea (adult) (pediatric): Secondary | ICD-10-CM

## 2015-03-29 LAB — COMPREHENSIVE METABOLIC PANEL
ALT: 21 U/L (ref 0–35)
AST: 23 U/L (ref 0–37)
Albumin: 3.9 g/dL (ref 3.5–5.2)
Alkaline Phosphatase: 69 U/L (ref 39–117)
BUN: 19 mg/dL (ref 6–23)
CO2: 29 mEq/L (ref 19–32)
Calcium: 9.3 mg/dL (ref 8.4–10.5)
Chloride: 101 mEq/L (ref 96–112)
Creatinine, Ser: 0.8 mg/dL (ref 0.40–1.20)
GFR: 78.62 mL/min (ref >60.00)
Glucose, Bld: 93 mg/dL (ref 70–99)
Potassium: 4.3 mEq/L (ref 3.5–5.1)
Sodium: 140 mEq/L (ref 135–145)
Total Bilirubin: 0.3 mg/dL (ref 0.2–1.2)
Total Protein: 7.4 g/dL (ref 6.0–8.3)

## 2015-03-29 LAB — LIPID PANEL
Cholesterol: 200 mg/dL (ref 0–200)
HDL: 44.6 mg/dL (ref >39.00)
LDL Cholesterol: 136 mg/dL — ABNORMAL HIGH (ref 0–99)
NonHDL: 155.15
Total CHOL/HDL Ratio: 4
Triglycerides: 98 mg/dL (ref 0.0–149.0)
VLDL: 19.6 mg/dL (ref 0.0–40.0)

## 2015-03-29 LAB — VITAMIN D 25 HYDROXY (VIT D DEFICIENCY, FRACTURES): VITD: 47.85 ng/mL (ref 30.00–100.00)

## 2015-03-29 NOTE — Telephone Encounter (Signed)
Order has been placed to get nasal pillows. Pt aware. Nothing further needed

## 2015-03-29 NOTE — Assessment & Plan Note (Signed)
Recheck labs today. 

## 2015-03-29 NOTE — Assessment & Plan Note (Signed)
Having a tough time getting comfortable with CPAP fit.  Working with pulm

## 2015-03-29 NOTE — Patient Instructions (Signed)
Good to see you. We will call you with your lab results.   

## 2015-03-29 NOTE — Assessment & Plan Note (Signed)
Has gained weight but does appear euvolemic.  Followed by cards. No changes made.

## 2015-03-29 NOTE — Telephone Encounter (Signed)
She has cpap already same setiings as prior

## 2015-03-29 NOTE — Progress Notes (Signed)
Subjective:   Patient ID: Deborah [name], female    DOB: 28-Nov-1955, 59 y.o.   MRN: [id number]  Deborah [name] is a pleasant 59 y.o. year old female who presents to clinic today with Follow-up  on 03/29/2015  HPI:  CHF- last saw Dr. Fletcher Anon on 5/19.  Note reviewed.  Felt she was euvolemic.  No changes were made. Feels she's doing ok.  Having trouble with her CPAP being uncomfortable at night. Wt Readings from Last 3 Encounters:  03/29/15 229 lb (103.874 kg)  03/24/15 230 lb 12.8 oz (104.69 kg)  12/03/14 220 lb 12 oz (100.132 kg)   OSA and chronic respiratory failure- last saw Dr. Elsworth Soho on 03/24/15.  Note reviewed. Advised nasal pillows trial and weight loss. Continue oxygen during sleep and walking.  Ok to take a break from O2 during rest time during day.  Vit D deficiency- repleted with high dose vit D April 2016. Improved in 12/2014 but not still at goal.  Repeated course of high dose vit D in 12/2014.  Lab Results  Component Value Date   CHOL 185 12/13/2012   HDL 34.70* 12/13/2012   LDLCALC 120* 12/13/2012   TRIG 152.0* 12/13/2012   CHOLHDL 5 12/13/2012   Lab Results  Component Value Date   CREATININE 1.00 10/27/2014   Lab Results  Component Value Date   WBC 11.3* 10/27/2014   HGB 14.0 10/27/2014   HCT 42.1 10/27/2014   MCV 87.6 10/27/2014   PLT 297.0 10/27/2014   Lab Results  Component Value Date   TSH 2.12 10/27/2014    Current Outpatient Prescriptions on File Prior to Visit  Medication Sig Dispense Refill  . Artificial Tear Solution (SOOTHE XP OP) Apply 1 drop to eye daily.    Marland Kitchen aspirin 81 MG chewable tablet Chew 1 tablet (81 mg total) by mouth daily. 30 tablet 1  . carboxymethylcellulose (REFRESH PLUS) 0.5 % SOLN 1 drop 3 (three) times daily as needed.    . fexofenadine (ALLEGRA) 30 MG/5ML suspension Take 5 mLs (30 mg total) by mouth daily. 300 mL 12  . furosemide (LASIX) 40 MG tablet TAKE 1 TABLET BY MOUTH EVERY DAY 90 tablet 0  . ipratropium-albuterol  (DUONEB) 0.5-2.5 (3) MG/3ML SOLN Take 3 mLs by nebulization every 6 (six) hours. 360 mL 0  . losartan (COZAAR) 50 MG tablet TAKE 1 TABLET BY MOUTH EVERY DAY 90 tablet 2  . metoprolol tartrate (LOPRESSOR) 25 MG tablet TAKE 1/2 TABLET BY MOUTH TWICE DAILY 180 tablet 2  . montelukast (SINGULAIR) 10 MG tablet TAKE 1 TABLET BY MOUTH EVERY NIGHT AT BEDTIME 90 tablet 0  . montelukast (SINGULAIR) 10 MG tablet TAKE 1 TABLET BY MOUTH EVERY NIGHT AT BEDTIME 90 tablet 1  . potassium chloride SA (K-DUR,KLOR-CON) 20 MEQ tablet TAKE 1/2 TABLET(10 MEQ) BY MOUTH DAILY 45 tablet 0  . Vitamin D, Ergocalciferol, (DRISDOL) 50000 UNITS CAPS capsule TAKE ONE CAPSULE BY MOUTH EVERY 7 DAYS (Patient not taking: Reported on 03/29/2015) 6 capsule 0   No current facility-administered medications on file prior to visit.    No Known Allergies  Past Medical History  Diagnosis Date  . Umbilical hernia   . Asthma   . Anxiety   . Depression   . Shortness of breath   . Pneumonia     hx of  . Arthritis   . Anemia     as teen  . PONV (postoperative nausea and vomiting)   . Family history of anesthesia complication  vomiting  . Histoplasmosis     left eye  . Hypertension   . Restrictive lung disease   . COPD, mild   . Heart failure     New onset 07/25/14  . Obesity (BMI 30-39.9)   . Tobacco abuse   . Hyperkalemia   . CHF (congestive heart failure)     Past Surgical History  Procedure Laterality Date  . Cesarean section  04/12/85  . Ventral hernia repair N/A 03/19/2013    Procedure:  OPEN VENTRAL HERNIA REPAIR WITH MESH AND APPLICATION OF WOUND VAC;  Surgeon: Madilyn Hook, DO;  Location: [facility];  Service: General;  Laterality: N/A;  . Insertion of mesh N/A 03/19/2013    Procedure: INSERTION OF MESH;  Surgeon: Madilyn Hook, DO;  Location: [facility];  Service: General;  Laterality: N/A;  . Application of wound vac  03/19/2013    Procedure: APPLICATION OF WOUND VAC;  Surgeon: Madilyn Hook, DO;  Location: [facility];   Service: General;;    Family History  Problem Relation Age of Onset  . Heart attack Mother   . Emphysema Mother     was a smoker  . Lymphoma Maternal Uncle   . Pancreatic cancer Maternal Aunt   . Emphysema      Mother  . Heart failure      Father  . Cancer      Unknown    Social History   Social History  . Marital Status: Married    Spouse Name: N/A  . Number of Children: 1  . Years of Education: N/A   Occupational History  . Retired    Social History Main Topics  . Smoking status: Former Smoker -- 3.00 packs/day for 43 years    Types: Cigarettes    Quit date: 12/17/2012  . Smokeless tobacco: Not on file  . Alcohol Use: No  . Drug Use: No  . Sexual Activity: Not on file   Other Topics Concern  . Not on file   Social History Narrative   ** Merged History Encounter **       ** Data from: 07/25/14 Enc Dept: [facility]       ** Data from: 06/05/13 Enc Dept: [facility]   Married, one son 10 yo.            The PMH, PSH, Social History, Family History, Medications, and allergies have been reviewed in [facility], and have been updated if relevant.   Review of Systems  Constitutional: Positive for fatigue.  HENT: Negative.   Respiratory: Positive for shortness of breath. Negative for wheezing.   Cardiovascular: Negative.   Gastrointestinal: Negative.   Endocrine: Negative.   Genitourinary: Negative.   Musculoskeletal: Positive for neck stiffness. Negative for myalgias, joint swelling and neck pain.  Skin: Negative.   Allergic/Immunologic: Negative.   Hematological: Negative.   Psychiatric/Behavioral: Negative.   All other systems reviewed and are negative.      Objective:    BP 130/62 mmHg  Pulse 104  Temp(Src) 98.1 F (36.7 C) (Oral)  Wt 229 lb (103.874 kg)  SpO2 94%   Physical Exam  Constitutional: She is oriented to person, place, and time. She appears well-developed and well-nourished. No distress.  HENT:  Head: Normocephalic.    O2 per [place]  Eyes: Conjunctivae are normal.  Neck: Normal range of motion.  Cardiovascular: Normal rate.   Pulmonary/Chest: Effort normal.  Abdominal: Soft.  Musculoskeletal: Normal range of motion. She exhibits no edema.  Neurological: She is  alert and oriented to person, place, and time. No cranial nerve deficit.  Skin: Skin is warm and dry.  Psychiatric: She has a normal mood and affect. Her behavior is normal. Judgment and thought content normal.  Nursing note and vitals reviewed.         Assessment & Plan:   Bipolar 1 disorder  Chronic diastolic heart failure - Plan: Lipid panel, Comprehensive metabolic panel  Essential hypertension  OSA (obstructive sleep apnea)  Vitamin D deficiency - Plan: Vitamin D, 25-hydroxy No Follow-up on file.

## 2015-03-29 NOTE — Progress Notes (Signed)
Pre visit review using our clinic review tool, if applicable. No additional management support is needed unless otherwise documented below in the visit note. 

## 2015-03-29 NOTE — Assessment & Plan Note (Signed)
Normotensive.  No changes made.

## 2015-03-30 ENCOUNTER — Encounter: Payer: Self-pay | Admitting: Family Medicine

## 2015-04-07 ENCOUNTER — Encounter: Payer: Self-pay | Admitting: Pulmonary Disease

## 2015-04-20 ENCOUNTER — Telehealth: Payer: Self-pay

## 2015-04-20 NOTE — Telephone Encounter (Signed)
Pt wants to change pharmacy to [facility] [address] and request rx sent for singulair; advised [facility] can contact [facility] and transfer refills available; [facility].

## 2015-05-04 ENCOUNTER — Telehealth: Payer: Self-pay | Admitting: Pulmonary Disease

## 2015-05-04 NOTE — Telephone Encounter (Signed)
Spoke with pt. She reports she did not receive a call about getting the nasal pillows. I advised in our system it is showing confirmation by [facility] w/ [facility] the order was received.  I have sent Melissa a staff message. Will await response.

## 2015-05-04 NOTE — Telephone Encounter (Signed)
Per Lenna Sciara w/ [facility]: Hey Mindy.  Please see tel enc dated 9/8 for the back story on this one. Our RT manager has agreed to let this pt private pay for nasal pillows so she can try them before the MD writes an order for her permanent CPAP (she is currently still on the hospital discharge unit). However, she is in collections with [place] so she'll need to get that cleared up before we can provide her with anything. She needs to call [place].  Thanks ---  lmtcb x1 for pt

## 2015-05-05 NOTE — Telephone Encounter (Signed)
LMTCB

## 2015-05-06 NOTE — Telephone Encounter (Signed)
Pt returned call. I explained Melissa's message that is documented below and gave pt the number to [employer]. Pt voiced understanding and had no further questions. Nothing further needed, will sign off on message.

## 2015-05-06 NOTE — Telephone Encounter (Signed)
lmtcb X2 for pt to relay below info

## 2015-05-24 ENCOUNTER — Ambulatory Visit: Payer: Self-pay | Admitting: Adult Health

## 2015-06-01 ENCOUNTER — Encounter: Payer: Self-pay | Admitting: Cardiovascular Disease

## 2015-06-01 ENCOUNTER — Ambulatory Visit (INDEPENDENT_AMBULATORY_CARE_PROVIDER_SITE_OTHER): Payer: 59 | Admitting: Cardiovascular Disease

## 2015-06-01 VITALS — BP 110/80 | HR 108 | Ht 63.0 in | Wt 227.0 lb

## 2015-06-01 DIAGNOSIS — R079 Chest pain, unspecified: Secondary | ICD-10-CM | POA: Diagnosis not present

## 2015-06-01 DIAGNOSIS — R0602 Shortness of breath: Secondary | ICD-10-CM | POA: Diagnosis not present

## 2015-06-01 DIAGNOSIS — I10 Essential (primary) hypertension: Secondary | ICD-10-CM | POA: Diagnosis not present

## 2015-06-01 DIAGNOSIS — I5032 Chronic diastolic (congestive) heart failure: Secondary | ICD-10-CM | POA: Diagnosis not present

## 2015-06-01 MED ORDER — LOSARTAN POTASSIUM 25 MG PO TABS: 25.0000 mg | ORAL_TABLET | Freq: Every day | ORAL | Status: DC

## 2015-06-01 MED ORDER — DILTIAZEM HCL ER COATED BEADS 120 MG PO CP24
120.0000 mg | ORAL_CAPSULE | Freq: Every day | ORAL | Status: DC
Start: 2015-06-01 — End: 2015-12-09

## 2015-06-01 NOTE — Patient Instructions (Signed)
Medication Instructions:  Your physician has recommended you make the following change in your medication:  STOP taking metoprolol START taking diltiazem ER '120mg'$  once per day DECREASE losartan to '25mg'$  once per day   Labwork: none  Testing/Procedures: None  Follow-Up: Your physician wants you to follow-up in: six months with Dr. Fletcher Anon.  You will receive a reminder letter in the mail two months in advance. If you don't receive a letter, please call our office to schedule the follow-up appointment.   Any Other Special Instructions Will Be Listed Below (If Applicable).     If you need a refill on your cardiac medications before your next appointment, please call your pharmacy.

## 2015-06-01 NOTE — Progress Notes (Signed)
HPI  Deborah [name] is a pleasant 59 y/o female who is here today for a follow-up visit regarding chronic diastolic heart failure and chronic right failure due to COPD. She has known history of tobacco use with COPD, essential Htn, Morbid obesity, Body mass index is 45.12 kg/(m^2) and bipolar disorder.  She was hospitalized in January, 2016 at [facility] with worsening dyspnea. She was found to be in heart failure and was started on furosemide. She had moderate size left pleural effusion. Echocardiogram showed an ejection fraction of 96-75%, grade 1 diastolic dysfunction, dilated right ventricle with hypokinesis and mild pulmonary hypertension.   She gradually improved with combination of diuretics/bronchodilators/oxygen supplementation/systemic steroids/BiPAP. VQ scan was low probability for pulmonary embolism She quit smoking completely since then with gradual improvement in symptoms. She denies any chest discomfort. She continues to have chronic exertional dyspnea and palpitations.  No Known Allergies   Current Outpatient Prescriptions on File Prior to Visit  Medication Sig Dispense Refill  . aspirin 81 MG chewable tablet Chew 1 tablet (81 mg total) by mouth daily. 30 tablet 1  . fexofenadine (ALLEGRA) 30 MG/5ML suspension Take 5 mLs (30 mg total) by mouth daily. 300 mL 12  . furosemide (LASIX) 40 MG tablet TAKE 1 TABLET BY MOUTH EVERY DAY 90 tablet 0  . ipratropium-albuterol (DUONEB) 0.5-2.5 (3) MG/3ML SOLN Take 3 mLs by nebulization every 6 (six) hours. 360 mL 0  . montelukast (SINGULAIR) 10 MG tablet TAKE 1 TABLET BY MOUTH EVERY NIGHT AT BEDTIME 90 tablet 0  . potassium chloride SA (K-DUR,KLOR-CON) 20 MEQ tablet TAKE 1/2 TABLET(10 MEQ) BY MOUTH DAILY 45 tablet 0   No current facility-administered medications on file prior to visit.     Past Medical History  Diagnosis Date  . Umbilical hernia   . Asthma   . Anxiety   . Depression   . Shortness of breath   . Pneumonia     hx of  . Arthritis   . Anemia     as teen  . PONV (postoperative nausea and vomiting)   . Family history of anesthesia complication     vomiting  . Histoplasmosis     left eye  . Hypertension   . Restrictive lung disease   . COPD, mild ([place])   . Heart failure ([place])     New onset 07/25/14  . Obesity (BMI 30-39.9)   . Tobacco abuse   . Hyperkalemia   . CHF (congestive heart failure) [facility])      Past Surgical History  Procedure Laterality Date  . Cesarean section  04/12/85  . Ventral hernia repair N/A 03/19/2013    Procedure:  OPEN VENTRAL HERNIA REPAIR WITH MESH AND APPLICATION OF WOUND VAC;  Surgeon: Madilyn Hook, DO;  Location[facility];  Service: General;  Laterality: N/A;  . Insertion of mesh N/A 03/19/2013    Procedure: INSERTION OF MESH;  Surgeon: Madilyn Hook, DO;  Location: [facility];  Service: General;  Laterality: N/A;  . Application of wound vac  03/19/2013    Procedure: APPLICATION OF WOUND VAC;  Surgeon: Madilyn Hook, DO;  Location: [facility];  Service: General;;     Family History  Problem Relation Age of Onset  . Heart attack Mother   . Emphysema Mother     was a smoker  . Lymphoma Maternal Uncle   . Pancreatic cancer Maternal Aunt   . Emphysema      Mother  . Heart failure  Father  . Cancer      Unknown     Social History   Social History  . Marital Status: Married    Spouse Name: N/A  . Number of Children: 1  . Years of Education: N/A   Occupational History  . Retired    Social History Main Topics  . Smoking status: Former Smoker -- 3.00 packs/day for 43 years    Types: Cigarettes    Quit date: 12/17/2012  . Smokeless tobacco: Not on file  . Alcohol Use: No  . Drug Use: No  . Sexual Activity: Not on file   Other Topics Concern  . Not on file   Social History Narrative   ** Merged History Encounter **       ** Data from: 07/25/14 Enc Dept: [facility]       ** Data from: 06/05/13 Enc Dept: [facility]   Married, one son 51 yo.                PHYSICAL EXAM   BP 110/80 mmHg  Pulse 108  Ht '5\' 3"'$  (1.6 m)  Wt 227 lb (102.967 kg)  BMI 40.22 kg/m2 Constitutional: She is oriented to person, place, and time. She appears well-developed and well-nourished. No distress.  HENT: No nasal discharge.  Head: Normocephalic and atraumatic.  Eyes: Pupils are equal and round. No discharge.  Neck: Normal range of motion. Neck supple. No JVD present. No thyromegaly present.  Cardiovascular: Tachycardic, regular rhythm, normal heart sounds. Exam reveals no gallop and no friction rub. No murmur heard.  Pulmonary/Chest: Effort normal and diminished breath sounds. No stridor. No respiratory distress. She has no wheezes. She has no rales. She exhibits no tenderness.  Abdominal: Soft. Bowel sounds are normal. She exhibits no distension. There is no tenderness. There is no rebound and no guarding.  Musculoskeletal: Normal range of motion. She exhibits no edema and no tenderness.  Neurological: She is alert and oriented to person, place, and time. Coordination normal.  Skin: Skin is warm and dry. No rash noted. She is not diaphoretic. No erythema. No pallor.  Psychiatric: She has a normal mood and affect. Her behavior is normal. Judgment and thought content normal.    EKG: Sinus tachycardia with low voltage.  ASSESSMENT AND PLAN

## 2015-06-01 NOTE — Assessment & Plan Note (Signed)
She appears to be euvolemic on current dose of furosemide. He continues to have sinus tachycardia which is not optimal. She has mild expiratory wheezing and I think it would be difficult to increase the dose of metoprolol given her advanced lung disease. Thus, I elected to switch her from metoprolol to diltiazem.

## 2015-06-01 NOTE — Assessment & Plan Note (Signed)
I decreased the dose of losartan to 25 mg once daily in order to allow the addition of diltiazem to treat sinus tachycardia.

## 2015-06-07 ENCOUNTER — Other Ambulatory Visit: Payer: Self-pay | Admitting: Family Medicine

## 2015-06-09 NOTE — Telephone Encounter (Signed)
Last labs 03/2015

## 2015-06-16 ENCOUNTER — Other Ambulatory Visit: Payer: Self-pay | Admitting: Cardiovascular Disease

## 2015-06-17 ENCOUNTER — Other Ambulatory Visit: Payer: Self-pay

## 2015-06-17 MED ORDER — POTASSIUM CHLORIDE CRYS ER 20 MEQ PO TBCR: EXTENDED_RELEASE_TABLET | ORAL | Status: DC

## 2015-07-22 ENCOUNTER — Other Ambulatory Visit: Payer: Self-pay | Admitting: Family Medicine

## 2015-08-27 ENCOUNTER — Other Ambulatory Visit: Payer: Self-pay | Admitting: Family Medicine

## 2015-08-30 ENCOUNTER — Other Ambulatory Visit: Payer: Self-pay | Admitting: Family Medicine

## 2015-08-30 ENCOUNTER — Other Ambulatory Visit: Payer: Self-pay

## 2015-08-30 MED ORDER — FUROSEMIDE 20 MG PO TABS: ORAL_TABLET | ORAL | Status: DC

## 2015-08-30 NOTE — Telephone Encounter (Signed)
Pt left v/m requesting refill furosemide 20 mg; last refilled # 30 on 02/04/15.[facility] [address] [place]. Pt said only takes when wt gain of 3 lbs in one day. Pt last seen 03/29/15.

## 2015-09-13 ENCOUNTER — Other Ambulatory Visit: Payer: Self-pay | Admitting: Family Medicine

## 2015-10-14 ENCOUNTER — Ambulatory Visit: Payer: Self-pay | Admitting: Family Medicine

## 2015-11-29 ENCOUNTER — Encounter: Payer: Self-pay | Admitting: Cardiovascular Disease

## 2015-11-29 ENCOUNTER — Ambulatory Visit (INDEPENDENT_AMBULATORY_CARE_PROVIDER_SITE_OTHER): Payer: 59 | Admitting: Cardiovascular Disease

## 2015-11-29 VITALS — BP 104/70 | HR 87 | Ht 63.0 in | Wt 222.8 lb

## 2015-11-29 DIAGNOSIS — I5032 Chronic diastolic (congestive) heart failure: Secondary | ICD-10-CM | POA: Diagnosis not present

## 2015-11-29 DIAGNOSIS — I10 Essential (primary) hypertension: Secondary | ICD-10-CM

## 2015-11-29 NOTE — Patient Instructions (Signed)
Medication Instructions:  Your physician has recommended you make the following change in your medication:  1. Stop taking losartan   Labwork: None ordered  Testing/Procedures: None ordered  Follow-Up: Your physician wants you to follow-up in: 6 months with Dr. Fletcher Anon. You will receive a reminder letter in the mail two months in advance. If you don't receive a letter, please call our office to schedule the follow-up appointment.   Any Other Special Instructions Will Be Listed Below (If Applicable).     If you need a refill on your cardiac medications before your next appointment, please call your pharmacy.

## 2015-11-29 NOTE — Progress Notes (Signed)
Cardiology Office Note   Date:  11/29/2015   ID:  [name], [name] 03-10-1956, MRN [id number]  PCP:  [name], MD  Cardiologist:   Kathlyn Sacramento, MD   Chief Complaint  Patient presents with  . other    6 month f/u c/o edema. Meds reviewed verbally.      History of Present Illness: Deborah [name] is a 60 y.o. female who presents for a follow-up visit regarding chronic diastolic heart failure and chronic right heart failure due to COPD. She has known history of tobacco use with COPD, essential Htn, Obesity and bipolar disorder.  She was hospitalized in January, 2016 at [facility] with worsening dyspnea. She was found to be in heart failure and was started on furosemide. She had moderate size left pleural effusion. Echocardiogram showed an ejection fraction of 33-35%, grade 1 diastolic dysfunction, dilated right ventricle with hypokinesis and mild pulmonary hypertension.   She gradually improved with combination of diuretics/bronchodilators/oxygen supplementation/systemic steroids/BiPAP. VQ scan was low probability for pulmonary embolism She quit smoking completely since then . She has been doing much better with improved dyspnea. No chest pain or palpitations. During last visit, I switched metoprolol to diltiazem. Heart rate has been more controlled. She does complain of occasional dizziness especially when she stands up.   Past Medical History  Diagnosis Date  . Umbilical hernia   . Asthma   . Anxiety   . Depression   . Shortness of breath   . Pneumonia     hx of  . Arthritis   . Anemia     as teen  . PONV (postoperative nausea and vomiting)   . Family history of anesthesia complication     vomiting  . Histoplasmosis     left eye  . Hypertension   . Restrictive lung disease   . COPD, mild ([place])   . Heart failure ([place])     New onset 07/25/14  . Obesity (BMI 30-39.9)   . Tobacco abuse   . Hyperkalemia   . CHF (congestive heart failure) [facility])      Past Surgical History  Procedure Laterality Date  . Cesarean section  04/12/85  . Ventral hernia repair N/A 03/19/2013    Procedure:  OPEN VENTRAL HERNIA REPAIR WITH MESH AND APPLICATION OF WOUND VAC;  Surgeon: Madilyn Hook, DO;  Location[facility];  Service: General;  Laterality: N/A;  . Insertion of mesh N/A 03/19/2013    Procedure: INSERTION OF MESH;  Surgeon: Madilyn Hook, DO;  Location: [facility];  Service: General;  Laterality: N/A;  . Application of wound vac  03/19/2013    Procedure: APPLICATION OF WOUND VAC;  Surgeon: Madilyn Hook, DO;  Location: [facility];  Service: General;;     Current Outpatient Prescriptions  Medication Sig Dispense Refill  . aspirin 81 MG chewable tablet Chew 1 tablet (81 mg total) by mouth daily. 30 tablet 1  . diltiazem (CARDIZEM CD) 120 MG 24 hr capsule Take 1 capsule (120 mg total) by mouth daily. 30 capsule 5  . fexofenadine (ALLEGRA) 30 MG/5ML suspension Take 5 mLs (30 mg total) by mouth daily. 300 mL 12  . furosemide (LASIX) 40 MG tablet TAKE 1 TABLET BY MOUTH EVERY DAY 90 tablet 0  . ipratropium-albuterol (DUONEB) 0.5-2.5 (3) MG/3ML SOLN Take 3 mLs by nebulization every 6 (six) hours. 360 mL 0  . losartan (COZAAR) 25 MG tablet Take 1 tablet (25 mg total) by mouth daily. 30 tablet 5  .  montelukast (SINGULAIR) 10 MG tablet TAKE 1 TABLET BY MOUTH EVERY NIGHT AT BEDTIME 90 tablet 0  . potassium chloride SA (K-DUR,KLOR-CON) 20 MEQ tablet TAKE 1/2 TABLET(10 MEQ) BY MOUTH DAILY 45 tablet 3   No current facility-administered medications for this visit.    Allergies:   Review of patient's allergies indicates no known allergies.    Social History:  The patient  reports that she quit smoking about 2 years ago. Her smoking use included Cigarettes. She has a 129 pack-year smoking history. She does not have any smokeless tobacco history on file. She reports that she drinks alcohol. She reports that she does not use illicit drugs.   Family History:  The patient's family  history includes Emphysema in her mother; Heart attack in her mother; Lymphoma in her maternal uncle; Pancreatic cancer in her maternal aunt.    ROS:  Please see the history of present illness.   Otherwise, review of systems are positive for none.   All other systems are reviewed and negative.    PHYSICAL EXAM: VS:  BP 104/70 mmHg  Pulse 87  Ht '5\' 3"'$  (1.6 m)  Wt 222 lb 12 oz (101.039 kg)  BMI 39.47 kg/m2 , BMI Body mass index is 39.47 kg/(m^2). GEN: Well nourished, well developed, in no acute distress HEENT: normal Neck: no JVD, carotid bruits, or masses Cardiac: RRR; no murmurs, rubs, or gallops,no edema  Respiratory:  clear to auscultation bilaterally, normal work of breathing GI: soft, nontender, nondistended, + BS MS: no deformity or atrophy Skin: warm and dry, no rash Neuro:  Strength and sensation are intact Psych: euthymic mood, full affect   EKG:  EKG is ordered today. The ekg ordered today demonstrates normal sinus rhythm with low voltage.   Recent Labs: 03/29/2015: ALT 21; BUN 19; Creatinine, Ser 0.80; Potassium 4.3; Sodium 140    Lipid Panel    Component Value Date/Time   CHOL 200 03/29/2015 1216   TRIG 98.0 03/29/2015 1216   HDL 44.60 03/29/2015 1216   CHOLHDL 4 03/29/2015 1216   VLDL 19.6 03/29/2015 1216   LDLCALC 136* 03/29/2015 1216      Wt Readings from Last 3 Encounters:  11/29/15 222 lb 12 oz (101.039 kg)  06/01/15 227 lb (102.967 kg)  03/29/15 229 lb (103.874 kg)        ASSESSMENT AND PLAN:  1.  Chronic diastolic heart failure: She appears to be euvolemic on current dose of furosemide. Sinus tachycardia improved significantly with the switch to diltiazem.  2. Essential hypertension: Blood pressure continues to run on the low side and she complains of orthostatic dizziness. Thus, I discontinued losartan.    Disposition:   FU with me in 6 months  Signed,  Kathlyn Sacramento, MD  11/29/2015 11:49 AM    [place]

## 2015-12-09 ENCOUNTER — Other Ambulatory Visit: Payer: Self-pay | Admitting: Cardiovascular Disease

## 2015-12-09 ENCOUNTER — Other Ambulatory Visit: Payer: Self-pay | Admitting: Family Medicine

## 2015-12-16 ENCOUNTER — Telehealth: Payer: Self-pay | Admitting: Cardiovascular Disease

## 2015-12-16 NOTE — Telephone Encounter (Signed)
Pt calling stating we took patient off losartan And now she has the shakes  States she can be holding a spoon and it go flying across the room. Not sure if this is connected but would like to know what can be done.  Please advise.

## 2015-12-17 ENCOUNTER — Encounter (HOSPITAL_COMMUNITY): Payer: Self-pay | Admitting: *Deleted

## 2015-12-17 ENCOUNTER — Telehealth: Payer: Self-pay | Admitting: Cardiovascular Disease

## 2015-12-17 ENCOUNTER — Inpatient Hospital Stay (HOSPITAL_COMMUNITY)
Admission: EM | Admit: 2015-12-17 | Discharge: 2015-12-21 | DRG: 190 | Disposition: A | Discharge status: Home Health | Payer: 59 | Attending: Internal Medicine | Admitting: Internal Medicine

## 2015-12-17 DIAGNOSIS — Z9981 Dependence on supplemental oxygen: Secondary | ICD-10-CM

## 2015-12-17 DIAGNOSIS — I5032 Chronic diastolic (congestive) heart failure: Secondary | ICD-10-CM | POA: Diagnosis present

## 2015-12-17 DIAGNOSIS — J9622 Acute and chronic respiratory failure with hypercapnia: Secondary | ICD-10-CM | POA: Diagnosis present

## 2015-12-17 DIAGNOSIS — Z7982 Long term (current) use of aspirin: Secondary | ICD-10-CM

## 2015-12-17 DIAGNOSIS — R0602 Shortness of breath: Secondary | ICD-10-CM | POA: Diagnosis not present

## 2015-12-17 DIAGNOSIS — J9621 Acute and chronic respiratory failure with hypoxia: Secondary | ICD-10-CM | POA: Diagnosis present

## 2015-12-17 DIAGNOSIS — R296 Repeated falls: Secondary | ICD-10-CM | POA: Diagnosis present

## 2015-12-17 DIAGNOSIS — J440 Chronic obstructive pulmonary disease with acute lower respiratory infection: Principal | ICD-10-CM | POA: Diagnosis present

## 2015-12-17 DIAGNOSIS — Z800 Family history of malignant neoplasm of digestive organs: Secondary | ICD-10-CM

## 2015-12-17 DIAGNOSIS — Z825 Family history of asthma and other chronic lower respiratory diseases: Secondary | ICD-10-CM

## 2015-12-17 DIAGNOSIS — J189 Pneumonia, unspecified organism: Secondary | ICD-10-CM | POA: Diagnosis present

## 2015-12-17 DIAGNOSIS — M1990 Unspecified osteoarthritis, unspecified site: Secondary | ICD-10-CM | POA: Diagnosis present

## 2015-12-17 DIAGNOSIS — F418 Other specified anxiety disorders: Secondary | ICD-10-CM | POA: Diagnosis present

## 2015-12-17 DIAGNOSIS — Z8249 Family history of ischemic heart disease and other diseases of the circulatory system: Secondary | ICD-10-CM

## 2015-12-17 DIAGNOSIS — B399 Histoplasmosis, unspecified: Secondary | ICD-10-CM | POA: Diagnosis present

## 2015-12-17 DIAGNOSIS — G9349 Other encephalopathy: Secondary | ICD-10-CM | POA: Diagnosis present

## 2015-12-17 DIAGNOSIS — I110 Hypertensive heart disease with heart failure: Secondary | ICD-10-CM | POA: Diagnosis present

## 2015-12-17 DIAGNOSIS — Z87891 Personal history of nicotine dependence: Secondary | ICD-10-CM

## 2015-12-17 DIAGNOSIS — Z79899 Other long term (current) drug therapy: Secondary | ICD-10-CM

## 2015-12-17 DIAGNOSIS — G4733 Obstructive sleep apnea (adult) (pediatric): Secondary | ICD-10-CM | POA: Diagnosis present

## 2015-12-17 DIAGNOSIS — Z6837 Body mass index (BMI) 37.0-37.9, adult: Secondary | ICD-10-CM

## 2015-12-17 DIAGNOSIS — Z9119 Patient's noncompliance with other medical treatment and regimen: Secondary | ICD-10-CM

## 2015-12-17 DIAGNOSIS — J310 Chronic rhinitis: Secondary | ICD-10-CM | POA: Diagnosis present

## 2015-12-17 DIAGNOSIS — J441 Chronic obstructive pulmonary disease with (acute) exacerbation: Secondary | ICD-10-CM

## 2015-12-17 DIAGNOSIS — I272 Other secondary pulmonary hypertension: Secondary | ICD-10-CM | POA: Diagnosis present

## 2015-12-17 DIAGNOSIS — J9690 Respiratory failure, unspecified, unspecified whether with hypoxia or hypercapnia: Secondary | ICD-10-CM

## 2015-12-17 DIAGNOSIS — Z807 Family history of other malignant neoplasms of lymphoid, hematopoietic and related tissues: Secondary | ICD-10-CM

## 2015-12-17 MED ORDER — ALBUTEROL SULFATE (2.5 MG/3ML) 0.083% IN NEBU: 5.0000 mg | INHALATION_SOLUTION | Freq: Once | RESPIRATORY_TRACT | Status: DC

## 2015-12-17 MED ORDER — IPRATROPIUM BROMIDE 0.02 % IN SOLN
1.0000 mg | Freq: Once | RESPIRATORY_TRACT | Status: AC
  Administered 2015-12-17: 1 mg via RESPIRATORY_TRACT

## 2015-12-17 MED ORDER — METHYLPREDNISOLONE SODIUM SUCC 125 MG IJ SOLR
125.0000 mg | Freq: Once | INTRAMUSCULAR | Status: AC
  Administered 2015-12-18: 125 mg via INTRAVENOUS
  Filled 2015-12-17: qty 2

## 2015-12-17 MED ORDER — IPRATROPIUM BROMIDE 0.02 % IN SOLN
RESPIRATORY_TRACT | Status: AC
  Filled 2015-12-17: qty 5

## 2015-12-17 MED ORDER — ALBUTEROL (5 MG/ML) CONTINUOUS INHALATION SOLN
INHALATION_SOLUTION | RESPIRATORY_TRACT | Status: AC
  Filled 2015-12-17: qty 20

## 2015-12-17 MED ORDER — ALBUTEROL (5 MG/ML) CONTINUOUS INHALATION SOLN
15.0000 mg/h | INHALATION_SOLUTION | RESPIRATORY_TRACT | Status: DC
  Administered 2015-12-17: 15 mg/h via RESPIRATORY_TRACT

## 2015-12-17 NOTE — ED Provider Notes (Signed)
CSN: [id number]     Arrival date & time 12/17/15  2313 History   By signing my name below, I, Deborah [name]. Deborah [name], attest that this documentation has been prepared under the direction and in the presence of Deborah Arthurs, MD.  Electronically Signed: [name]. Deborah [name], ED Scribe. 12/17/2015. 11:51 PM.   Chief Complaint  Patient presents with  . Fall    The history is provided by the patient. No language interpreter was used.    HPI Comments: MARGUETTA [name] is a 60 y.o. female with a PMHx of asthma, HTN, COPD, and CHF who presents to the Emergency Department here after multiple falls with most recent episode this evening. Pt states "i would be walking and my legs would just gave out". She denies any LOC or head trauma. Pt reports ongoing, unchanged SOB with exertion and when in supine position. Ms. [name] typically uses 4 liters of Oxygen at night time but admits she feels she has been needing additional Oxygen throughout the day. Pt takes Lasix daily. Last hospitalization for COPD exacerbation 12/23/2014.  PCP: [name], MD    Past Medical History  Diagnosis Date  . Umbilical hernia   . Asthma   . Anxiety   . Depression   . Shortness of breath   . Pneumonia     hx of  . Arthritis   . Anemia     as teen  . PONV (postoperative nausea and vomiting)   . Family history of anesthesia complication     vomiting  . Histoplasmosis     left eye  . Hypertension   . Restrictive lung disease   . COPD, mild ([place])   . Heart failure ([place])     New onset 07/25/14  . Obesity (BMI 30-39.9)   . Tobacco abuse   . Hyperkalemia   . CHF (congestive heart failure) [facility])    Past Surgical History  Procedure Laterality Date  . Cesarean section  04/12/85  . Ventral hernia repair N/A 03/19/2013    Procedure:  OPEN VENTRAL HERNIA REPAIR WITH MESH AND APPLICATION OF WOUND VAC;  Surgeon: Madilyn Hook, DO;  Location: [facility];  Service: General;  Laterality: N/A;  . Insertion of mesh N/A 03/19/2013    Procedure:  INSERTION OF MESH;  Surgeon: Madilyn Hook, DO;  Location: [facility];  Service: General;  Laterality: N/A;  . Application of wound vac  03/19/2013    Procedure: APPLICATION OF WOUND VAC;  Surgeon: Madilyn Hook, DO;  Location: [facility];  Service: General;;   Family History  Problem Relation Age of Onset  . Heart attack Mother   . Emphysema Mother     was a smoker  . Lymphoma Maternal Uncle   . Pancreatic cancer Maternal Aunt   . Emphysema      Mother  . Heart failure      Father  . Cancer      Unknown   Social History  Substance Use Topics  . Smoking status: Former Smoker -- 3.00 packs/day for 43 years    Types: Cigarettes    Quit date: 12/17/2012  . Smokeless tobacco: None  . Alcohol Use: 0.0 oz/week    0 Standard drinks or equivalent per week   OB History    Gravida Para Term Preterm AB TAB SAB Ectopic Multiple Living   0 0 0 0 0 0 0 0       Review of Systems  Constitutional: Negative for fever and chills.  Respiratory:  Positive for shortness of breath.   Cardiovascular: Negative for chest pain.  All other systems reviewed and are negative.     Allergies  Review of patient's allergies indicates no known allergies.  Home Medications   Prior to Admission medications   Medication Sig Start Date End Date Taking? Authorizing Provider  aspirin 81 MG chewable tablet Chew 1 tablet (81 mg total) by mouth daily. 10/05/14  Yes [name], MD  CARTIA XT 120 MG 24 hr capsule TAKE ONE CAPSULE BY MOUTH DAILY Patient taking differently: TAKE 120 MG BY MOUTH DAILY 12/09/15  Yes [name], MD  Cholecalciferol (VITAMIN D-3) 1000 units CAPS Take 1,000 Units by mouth daily.   Yes Historical Provider, MD  furosemide (LASIX) 40 MG tablet TAKE 1 TABLET BY MOUTH EVERY DAY Patient taking differently: TAKE 40 MG BY MOUTH EVERY DAY 12/09/15  Yes [name], MD  montelukast (SINGULAIR) 10 MG tablet TAKE 1 TABLET BY MOUTH EVERY NIGHT AT BEDTIME Patient taking differently: TAKE 10 MG BY  MOUTH EVERY NIGHT AT BEDTIME 12/29/14  Yes [name], MD  potassium chloride SA (K-DUR,KLOR-CON) 20 MEQ tablet TAKE 1/2 TABLET(10 MEQ) BY MOUTH DAILY Patient taking differently: Take 10 mEq by mouth daily.  06/17/15  Yes [name], MD  pyridOXINE (VITAMIN B-6) 100 MG tablet Take 100 mg by mouth daily.   Yes Historical Provider, MD  vitamin B-12 (CYANOCOBALAMIN) 1000 MCG tablet Take 1,000 mcg by mouth daily.   Yes Historical Provider, MD   Triage Vitals: BP 124/64 mmHg  Pulse 105  Temp(Src) 98.5 F (36.9 C) (Oral)  Resp 29  SpO2 95%   Physical Exam  Constitutional: She is oriented to person, place, and time. She appears well-developed and well-nourished. No distress.  HENT:  Head: Normocephalic and atraumatic.  Eyes: EOM are normal.  Neck: Normal range of motion.  Cardiovascular: Normal rate, regular rhythm and normal heart sounds.   Pulmonary/Chest: Effort normal. She has wheezes.  Wheezing with crackles at bilateral bases   Abdominal: Soft. She exhibits no distension. There is no tenderness.  Musculoskeletal: Normal range of motion. She exhibits no edema.  No pedal or pitting edema noted   Neurological: She is alert and oriented to person, place, and time.  Skin: Skin is warm and dry.  Psychiatric: She has a normal mood and affect. Judgment normal.  Nursing note and vitals reviewed.   ED Course  Procedures (including critical care time)  CRITICAL CARE Performed by: Darl Householder, DAVID   Total critical care time: 30 minutes  Critical care time was exclusive of separately billable procedures and treating other patients.  Critical care was necessary to treat or prevent imminent or life-threatening deterioration.  Critical care was time spent personally by me on the following activities: development of treatment plan with patient and/or surrogate as well as nursing, discussions with consultants, evaluation of patient's response to treatment, examination of patient, obtaining  history from patient or surrogate, ordering and performing treatments and interventions, ordering and review of laboratory studies, ordering and review of radiographic studies, pulse oximetry and re-evaluation of patient's condition.    DIAGNOSTIC STUDIES: Oxygen Saturation is 93% on RA, adequate by my interpretation.    COORDINATION OF CARE: 11:39 PM- Will order blood work, EKG, and CXR. Will give breathing treatment. Discussed treatment plan with pt at bedside and pt agreed to plan.     Labs Review Labs Reviewed  CBC WITH DIFFERENTIAL/PLATELET - Abnormal; Notable for the following:  WBC 14.5 (*)    HCT 46.5 (*)    Neutro Abs 10.8 (*)    All other components within normal limits  COMPREHENSIVE METABOLIC PANEL - Abnormal; Notable for the following:    Chloride 93 (*)    CO2 34 (*)    Glucose, Bld 111 (*)    Calcium 8.5 (*)    Albumin 3.3 (*)    All other components within normal limits  BRAIN NATRIURETIC PEPTIDE - Abnormal; Notable for the following:    B Natriuretic Peptide 166.0 (*)    All other components within normal limits  BLOOD GAS, VENOUS - Abnormal; Notable for the following:    pH, Ven 7.320 (*)    pCO2, Ven 71.1 (*)    pO2, Ven 59.1 (*)    Bicarbonate 35.6 (*)    Acid-Base Excess 6.9 (*)    All other components within normal limits  BLOOD GAS, ARTERIAL - Abnormal; Notable for the following:    pH, Arterial 7.289 (*)    pCO2 arterial 74.8 (*)    pO2, Arterial 68.4 (*)    Bicarbonate 34.7 (*)    Acid-Base Excess 5.5 (*)    All other components within normal limits  CULTURE, BLOOD (ROUTINE X 2)  CULTURE, BLOOD (ROUTINE X 2)  CULTURE, EXPECTORATED SPUTUM-ASSESSMENT  GRAM STAIN  HIV ANTIBODY (ROUTINE TESTING)  STREP PNEUMONIAE URINARY ANTIGEN  I-STAT TROPOININ, ED  I-STAT CG4 LACTIC ACID, ED    Imaging Review Dg Chest Port 1 View  12/18/2015  CLINICAL DATA:  Shortness of breath EXAM: PORTABLE CHEST 1 VIEW COMPARISON:  07/28/2014 FINDINGS: Bronchitic  markings and hyperinflation. Streaky right infrahilar opacity. No edema, effusion, or pneumothorax. No suspected cardiomegaly when accounting for mediastinal fat pad. IMPRESSION: 1. Right infrahilar atelectasis or pneumonia. 2. COPD. Electronically Signed   By: Monte Fantasia M.D.   On: 12/18/2015 00:24   I have personally reviewed and evaluated these images and lab results as part of my medical decision-making.   EKG Interpretation   Date/Time:  Friday December 17 2015 23:30:07 EDT Ventricular Rate:  97 PR Interval:  189 QRS Duration: 96 QT Interval:  339 QTC Calculation: 431 R Axis:   85 Text Interpretation:  Sinus rhythm Low voltage, precordial leads  Borderline T abnormalities, anterior leads No significant change since  last tracing Confirmed by YAO  MD, DAVID ([id number]) on 12/17/2015 11:42:14 PM      MDM   Final diagnoses:  None   LEMYA GREENWELL is a 60 y.o. female here with shortness of breath, fall. Patient was hypoxic to 67% on RA in triage. She is on 4L [place] at baseline but requires more oxygen today. Has diffuse wheezing. Consider COPD vs CHF. Will check labs, CXR. Will likely admit.   1:39 AM WBC 15. CXR showed possible pneumonia. I ordered lactate, cultures. BNP 166, I doubt CHF exacerbation. Ordered IVF and ceftriaxone/azithro. VBG showed pH 7.3, CO2 71 and ABG showed pH 7.29 with CO 2 74. Patient put on bipap. Also given continuous nebs, solumedrol. Will admit to stepdown.     I personally performed the services described in this documentation, which was scribed in my presence. The recorded information has been reviewed and is accurate.   Deborah Arthurs, MD 12/18/15 [phone]

## 2015-12-17 NOTE — ED Notes (Signed)
Pt family brought her here tonight with c/o multiple falls recently, falling asleep, and decreased strength (like she is unable to hold anything in her hands). In triage, pt c/o headache (denies hitting her head), saturations in the 60's (wears 4 liters O2 only at night), wheezing throughout. Reports hx of CHF.

## 2015-12-17 NOTE — Telephone Encounter (Signed)
S/w pt who reports BP 120/82, HR 88-110, temp 98.6 She states temp is usually below normal so she thinks she may have a bit of a fever. She took morning medications 30 minutes before checking VS. Reports oxygen 68-72% per pulse ox while on room air. She wears 4L at night. I asked her to put on oxygen and recheck. States while wearing 4L [place], pulse ox reading 88-91%. She does not appear seriously SOB during our conversation but does sound slightly winded. Pt states she will "call my lung doctor". I advised her to be seen today emergently in an ER setting. She states she will try and get appt w/pulmonolgy today. Again, I advised her to continue wearing oxygen at this time and to be seen immediately as she does not normally wear it during the day and RA oxygen level is 68-72%.  She lives in [place] and states she lives equally as close to [facility] as [employer].  She states if she is unable to speak w/pulmonologist now she will proceed to ER either via PV assisted by spouse who is there now or call 911.  She is agreeable w/plan with no further questions.

## 2015-12-17 NOTE — Telephone Encounter (Signed)
S/w pt reports "having the shakes, chills and cold for four days". She is also SOB w/exertion. She has been wearing oxygen at night since January 2016 but does not during the day. She does not have a pulse ox therefore, is unsure of oxygen level. She takes lasix '40mg'$  daily with no missed doses. Pt does weigh herself each morning but does not document readings.  She reports some weight gain in the morning but "it's gone by the next morning". Denies any other sx including no LE swelling.  She is unsure if she has a fever but thinks she may have "picked up a bug". Advised pt to purchase a thermometer to monitor fever and pulse ox for oxygen level and notify PCP is she feels this is viral. She is agreeable w/plan and states she will call me back to report oxygen on room air. She had no further questions at this time.

## 2015-12-17 NOTE — Telephone Encounter (Signed)
Pt states her BP just now: 120/82, HR 110

## 2015-12-18 ENCOUNTER — Emergency Department (HOSPITAL_COMMUNITY): Payer: 59

## 2015-12-18 ENCOUNTER — Inpatient Hospital Stay (HOSPITAL_COMMUNITY): Payer: 59

## 2015-12-18 DIAGNOSIS — Z800 Family history of malignant neoplasm of digestive organs: Secondary | ICD-10-CM | POA: Diagnosis not present

## 2015-12-18 DIAGNOSIS — J310 Chronic rhinitis: Secondary | ICD-10-CM | POA: Diagnosis present

## 2015-12-18 DIAGNOSIS — Z6837 Body mass index (BMI) 37.0-37.9, adult: Secondary | ICD-10-CM | POA: Diagnosis not present

## 2015-12-18 DIAGNOSIS — J9622 Acute and chronic respiratory failure with hypercapnia: Secondary | ICD-10-CM

## 2015-12-18 DIAGNOSIS — J189 Pneumonia, unspecified organism: Secondary | ICD-10-CM | POA: Diagnosis present

## 2015-12-18 DIAGNOSIS — Z9119 Patient's noncompliance with other medical treatment and regimen: Secondary | ICD-10-CM | POA: Diagnosis not present

## 2015-12-18 DIAGNOSIS — R296 Repeated falls: Secondary | ICD-10-CM | POA: Diagnosis present

## 2015-12-18 DIAGNOSIS — J441 Chronic obstructive pulmonary disease with (acute) exacerbation: Secondary | ICD-10-CM | POA: Diagnosis present

## 2015-12-18 DIAGNOSIS — G4733 Obstructive sleep apnea (adult) (pediatric): Secondary | ICD-10-CM | POA: Diagnosis present

## 2015-12-18 DIAGNOSIS — R0602 Shortness of breath: Secondary | ICD-10-CM | POA: Diagnosis present

## 2015-12-18 DIAGNOSIS — M1990 Unspecified osteoarthritis, unspecified site: Secondary | ICD-10-CM | POA: Diagnosis present

## 2015-12-18 DIAGNOSIS — I272 Other secondary pulmonary hypertension: Secondary | ICD-10-CM | POA: Diagnosis present

## 2015-12-18 DIAGNOSIS — J440 Chronic obstructive pulmonary disease with acute lower respiratory infection: Secondary | ICD-10-CM | POA: Diagnosis present

## 2015-12-18 DIAGNOSIS — B399 Histoplasmosis, unspecified: Secondary | ICD-10-CM | POA: Diagnosis present

## 2015-12-18 DIAGNOSIS — Z87891 Personal history of nicotine dependence: Secondary | ICD-10-CM | POA: Diagnosis not present

## 2015-12-18 DIAGNOSIS — Z8249 Family history of ischemic heart disease and other diseases of the circulatory system: Secondary | ICD-10-CM | POA: Diagnosis not present

## 2015-12-18 DIAGNOSIS — Z9981 Dependence on supplemental oxygen: Secondary | ICD-10-CM | POA: Diagnosis not present

## 2015-12-18 DIAGNOSIS — F418 Other specified anxiety disorders: Secondary | ICD-10-CM | POA: Diagnosis present

## 2015-12-18 DIAGNOSIS — J9621 Acute and chronic respiratory failure with hypoxia: Secondary | ICD-10-CM | POA: Diagnosis present

## 2015-12-18 DIAGNOSIS — Z7982 Long term (current) use of aspirin: Secondary | ICD-10-CM | POA: Diagnosis not present

## 2015-12-18 DIAGNOSIS — Z825 Family history of asthma and other chronic lower respiratory diseases: Secondary | ICD-10-CM | POA: Diagnosis not present

## 2015-12-18 DIAGNOSIS — Z79899 Other long term (current) drug therapy: Secondary | ICD-10-CM | POA: Diagnosis not present

## 2015-12-18 DIAGNOSIS — I110 Hypertensive heart disease with heart failure: Secondary | ICD-10-CM | POA: Diagnosis present

## 2015-12-18 DIAGNOSIS — I5032 Chronic diastolic (congestive) heart failure: Secondary | ICD-10-CM

## 2015-12-18 DIAGNOSIS — Z807 Family history of other malignant neoplasms of lymphoid, hematopoietic and related tissues: Secondary | ICD-10-CM | POA: Diagnosis not present

## 2015-12-18 DIAGNOSIS — G9349 Other encephalopathy: Secondary | ICD-10-CM | POA: Diagnosis present

## 2015-12-18 HISTORY — DX: Chronic obstructive pulmonary disease with (acute) exacerbation: J44.1

## 2015-12-18 LAB — COMPREHENSIVE METABOLIC PANEL
ALT: 21 U/L (ref 14–54)
AST: 23 U/L (ref 15–41)
Albumin: 3.3 g/dL — ABNORMAL LOW (ref 3.5–5.0)
Alkaline Phosphatase: 73 U/L (ref 38–126)
Anion gap: 10 (ref 5–15)
BUN: 15 mg/dL (ref 6–20)
CO2: 34 mmol/L — ABNORMAL HIGH (ref 22–32)
Calcium: 8.5 mg/dL — ABNORMAL LOW (ref 8.9–10.3)
Chloride: 93 mmol/L — ABNORMAL LOW (ref 101–111)
Creatinine, Ser: 0.86 mg/dL (ref 0.44–1.00)
GFR calc Af Amer: >60 mL/min (ref >60)
GFR calc non Af Amer: >60 mL/min (ref >60)
Glucose, Bld: 111 mg/dL — ABNORMAL HIGH (ref 65–99)
Potassium: 3.5 mmol/L (ref 3.5–5.1)
Sodium: 137 mmol/L (ref 135–145)
Total Bilirubin: 0.5 mg/dL (ref 0.3–1.2)
Total Protein: 7.2 g/dL (ref 6.5–8.1)

## 2015-12-18 LAB — BASIC METABOLIC PANEL
Anion gap: 14 (ref 5–15)
BUN: 15 mg/dL (ref 6–20)
CO2: 33 mmol/L — ABNORMAL HIGH (ref 22–32)
Calcium: 8.7 mg/dL — ABNORMAL LOW (ref 8.9–10.3)
Chloride: 89 mmol/L — ABNORMAL LOW (ref 101–111)
Creatinine, Ser: 0.87 mg/dL (ref 0.44–1.00)
GFR calc Af Amer: >60 mL/min (ref >60)
GFR calc non Af Amer: >60 mL/min (ref >60)
Glucose, Bld: 189 mg/dL — ABNORMAL HIGH (ref 65–99)
Potassium: 3.2 mmol/L — ABNORMAL LOW (ref 3.5–5.1)
Sodium: 136 mmol/L (ref 135–145)

## 2015-12-18 LAB — BLOOD GAS, VENOUS
Acid-Base Excess: 6.9 mmol/L — ABNORMAL HIGH (ref 0.0–2.0)
Bicarbonate: 35.6 mEq/L — ABNORMAL HIGH (ref 20.0–24.0)
O2 Content: 4 L/min
O2 Saturation: 87.5 %
Patient temperature: 98.6
TCO2: 31.6 mmol/L (ref 0–100)
pCO2, Ven: 71.1 mmHg (ref 45.0–50.0)
pH, Ven: 7.32 — ABNORMAL HIGH (ref 7.250–7.300)
pO2, Ven: 59.1 mmHg — ABNORMAL HIGH (ref 31.0–45.0)

## 2015-12-18 LAB — I-STAT TROPONIN, ED: Troponin i, poc: 0.03 ng/mL (ref 0.00–0.08)

## 2015-12-18 LAB — CBC WITH DIFFERENTIAL/PLATELET
Basophils Absolute: 0.1 10*3/uL (ref 0.0–0.1)
Basophils Relative: 0 %
Eosinophils Absolute: 0.1 10*3/uL (ref 0.0–0.7)
Eosinophils Relative: 1 %
HCT: 46.5 % — ABNORMAL HIGH (ref 36.0–46.0)
Hemoglobin: 14.5 g/dL (ref 12.0–15.0)
Lymphocytes Relative: 17 %
Lymphs Abs: 2.5 10*3/uL (ref 0.7–4.0)
MCH: 30.6 pg (ref 26.0–34.0)
MCHC: 31.2 g/dL (ref 30.0–36.0)
MCV: 98.1 fL (ref 78.0–100.0)
Monocytes Absolute: 1 10*3/uL (ref 0.1–1.0)
Monocytes Relative: 7 %
Neutro Abs: 10.8 10*3/uL — ABNORMAL HIGH (ref 1.7–7.7)
Neutrophils Relative %: 75 %
Platelets: 259 10*3/uL (ref 150–400)
RBC: 4.74 MIL/uL (ref 3.87–5.11)
RDW: 14.9 % (ref 11.5–15.5)
WBC: 14.5 10*3/uL — ABNORMAL HIGH (ref 4.0–10.5)

## 2015-12-18 LAB — CBC
HCT: 45.6 % (ref 36.0–46.0)
Hemoglobin: 14.1 g/dL (ref 12.0–15.0)
MCH: 30.3 pg (ref 26.0–34.0)
MCHC: 30.9 g/dL (ref 30.0–36.0)
MCV: 98.1 fL (ref 78.0–100.0)
Platelets: 254 10*3/uL (ref 150–400)
RBC: 4.65 MIL/uL (ref 3.87–5.11)
RDW: 15.1 % (ref 11.5–15.5)
WBC: 14.8 10*3/uL — ABNORMAL HIGH (ref 4.0–10.5)

## 2015-12-18 LAB — MRSA PCR SCREENING: MRSA by PCR: NEGATIVE

## 2015-12-18 LAB — BLOOD GAS, ARTERIAL
Acid-Base Excess: 5.5 mmol/L — ABNORMAL HIGH (ref 0.0–2.0)
Bicarbonate: 34.7 mEq/L — ABNORMAL HIGH (ref 20.0–24.0)
Drawn by: 11249
O2 Content: 6 L/min
O2 Saturation: 90.5 %
Patient temperature: 98.7
TCO2: 31.2 mmol/L (ref 0–100)
pCO2 arterial: 74.8 mmHg (ref 35.0–45.0)
pH, Arterial: 7.289 — ABNORMAL LOW (ref 7.350–7.450)
pO2, Arterial: 68.4 mmHg — ABNORMAL LOW (ref 80.0–100.0)

## 2015-12-18 LAB — LACTIC ACID, PLASMA: Lactic Acid, Venous: 2.1 mmol/L (ref 0.5–2.0)

## 2015-12-18 LAB — HIV ANTIBODY (ROUTINE TESTING W REFLEX): HIV Screen 4th Generation wRfx: NONREACTIVE

## 2015-12-18 LAB — I-STAT CG4 LACTIC ACID, ED: Lactic Acid, Venous: 2.63 mmol/L (ref 0.5–2.0)

## 2015-12-18 LAB — BRAIN NATRIURETIC PEPTIDE: B Natriuretic Peptide: 166 pg/mL — ABNORMAL HIGH (ref 0.0–100.0)

## 2015-12-18 MED ORDER — AZITHROMYCIN 250 MG PO TABS
500.0000 mg | ORAL_TABLET | Freq: Every day | ORAL | Status: DC
  Administered 2015-12-18 – 2015-12-20 (×3): 500 mg via ORAL
  Filled 2015-12-18 (×4): qty 2

## 2015-12-18 MED ORDER — ENOXAPARIN SODIUM 40 MG/0.4ML ~~LOC~~ SOLN: 40.0000 mg | SUBCUTANEOUS | Status: DC

## 2015-12-18 MED ORDER — IPRATROPIUM-ALBUTEROL 0.5-2.5 (3) MG/3ML IN SOLN
3.0000 mL | Freq: Four times a day (QID) | RESPIRATORY_TRACT | Status: DC
  Administered 2015-12-18 – 2015-12-19 (×6): 3 mL via RESPIRATORY_TRACT
  Filled 2015-12-18 (×6): qty 3

## 2015-12-18 MED ORDER — VITAMIN B-6 100 MG PO TABS
100.0000 mg | ORAL_TABLET | Freq: Every day | ORAL | Status: DC
  Administered 2015-12-18 – 2015-12-21 (×4): 100 mg via ORAL
  Filled 2015-12-18 (×4): qty 1

## 2015-12-18 MED ORDER — VITAMIN B-12 1000 MCG PO TABS
1000.0000 ug | ORAL_TABLET | Freq: Every day | ORAL | Status: DC
  Administered 2015-12-18 – 2015-12-21 (×4): 1000 ug via ORAL
  Filled 2015-12-18 (×4): qty 1

## 2015-12-18 MED ORDER — METHYLPREDNISOLONE SODIUM SUCC 125 MG IJ SOLR
80.0000 mg | Freq: Four times a day (QID) | INTRAMUSCULAR | Status: DC
  Administered 2015-12-18 – 2015-12-20 (×8): 80 mg via INTRAVENOUS
  Filled 2015-12-18 (×8): qty 2

## 2015-12-18 MED ORDER — DEXTROSE-NACL 5-0.45 % IV SOLN
INTRAVENOUS | Status: DC
  Administered 2015-12-18 – 2015-12-20 (×2): via INTRAVENOUS

## 2015-12-18 MED ORDER — DEXTROSE 5 % IV SOLN
1.0000 g | Freq: Once | INTRAVENOUS | Status: AC
  Administered 2015-12-18: 1 g via INTRAVENOUS
  Filled 2015-12-18: qty 10

## 2015-12-18 MED ORDER — MONTELUKAST SODIUM 10 MG PO TABS
10.0000 mg | ORAL_TABLET | Freq: Every day | ORAL | Status: DC
  Administered 2015-12-18 – 2015-12-20 (×3): 10 mg via ORAL
  Filled 2015-12-18 (×4): qty 1

## 2015-12-18 MED ORDER — LORATADINE 10 MG PO TABS
10.0000 mg | ORAL_TABLET | Freq: Every day | ORAL | Status: DC
  Administered 2015-12-18 – 2015-12-21 (×4): 10 mg via ORAL
  Filled 2015-12-18 (×5): qty 1

## 2015-12-18 MED ORDER — DEXTROSE 5 % IV SOLN
500.0000 mg | Freq: Once | INTRAVENOUS | Status: AC
  Administered 2015-12-18: 500 mg via INTRAVENOUS
  Filled 2015-12-18: qty 500

## 2015-12-18 MED ORDER — ENOXAPARIN SODIUM 60 MG/0.6ML ~~LOC~~ SOLN
50.0000 mg | Freq: Every day | SUBCUTANEOUS | Status: DC
  Administered 2015-12-18 – 2015-12-21 (×4): 50 mg via SUBCUTANEOUS
  Filled 2015-12-18 (×4): qty 0.6

## 2015-12-18 MED ORDER — CETYLPYRIDINIUM CHLORIDE 0.05 % MT LIQD
7.0000 mL | Freq: Two times a day (BID) | OROMUCOSAL | Status: DC
  Administered 2015-12-19 – 2015-12-20 (×3): 7 mL via OROMUCOSAL

## 2015-12-18 MED ORDER — PREDNISONE 20 MG PO TABS: 50.0000 mg | ORAL_TABLET | Freq: Every day | ORAL | Status: DC

## 2015-12-18 MED ORDER — CHLORHEXIDINE GLUCONATE 0.12 % MT SOLN
15.0000 mL | Freq: Two times a day (BID) | OROMUCOSAL | Status: DC
  Administered 2015-12-18 – 2015-12-21 (×6): 15 mL via OROMUCOSAL
  Filled 2015-12-18 (×7): qty 15

## 2015-12-18 MED ORDER — POTASSIUM CHLORIDE CRYS ER 20 MEQ PO TBCR
40.0000 meq | EXTENDED_RELEASE_TABLET | Freq: Once | ORAL | Status: AC
  Administered 2015-12-18: 40 meq via ORAL
  Filled 2015-12-18: qty 2

## 2015-12-18 MED ORDER — DILTIAZEM HCL ER COATED BEADS 120 MG PO CP24
120.0000 mg | ORAL_CAPSULE | Freq: Every day | ORAL | Status: DC
  Administered 2015-12-18 – 2015-12-21 (×4): 120 mg via ORAL
  Filled 2015-12-18 (×4): qty 1

## 2015-12-18 MED ORDER — SALINE SPRAY 0.65 % NA SOLN
2.0000 | Freq: Two times a day (BID) | NASAL | Status: DC
  Administered 2015-12-18 – 2015-12-21 (×7): 2 via NASAL
  Filled 2015-12-18: qty 44

## 2015-12-18 MED ORDER — ASPIRIN 81 MG PO CHEW
81.0000 mg | CHEWABLE_TABLET | Freq: Every day | ORAL | Status: DC
  Administered 2015-12-18 – 2015-12-21 (×4): 81 mg via ORAL
  Filled 2015-12-18 (×4): qty 1

## 2015-12-18 MED ORDER — FLUTICASONE PROPIONATE 50 MCG/ACT NA SUSP
2.0000 | Freq: Every day | NASAL | Status: DC
  Administered 2015-12-18 – 2015-12-21 (×4): 2 via NASAL
  Filled 2015-12-18: qty 16

## 2015-12-18 MED ORDER — DEXTROSE 5 % IV SOLN
1.0000 g | Freq: Every day | INTRAVENOUS | Status: DC
  Administered 2015-12-18 – 2015-12-20 (×3): 1 g via INTRAVENOUS
  Filled 2015-12-18 (×3): qty 10

## 2015-12-18 MED ORDER — SODIUM CHLORIDE 0.9 % IV BOLUS (SEPSIS)
1000.0000 mL | Freq: Once | INTRAVENOUS | Status: AC
  Administered 2015-12-18: 1000 mL via INTRAVENOUS

## 2015-12-18 NOTE — Progress Notes (Signed)
PROGRESS NOTE                                                                                                                                                                                                             Patient Demographics:    Deborah [name], is a 60 y.o. female, DOB - Dec 16, 1955, UEA:[id number]  Admit date - 12/17/2015   Admitting Physician Etta Quill, DO  Outpatient Primary MD for the patient is [name], MD  LOS - 0  Chief Complaint  Patient presents with  . Fall       Brief Narrative    Deborah [name] is a 60 y.o. female with medical history significant of COPD patient is on 4L O2 at home at baseline. Patient presents to the ED with c/o SOB, cough, congestion. Cough is productive. Patient had multiple falls PTA recently due to SOB. Last hospital stay for COPD exacerbation was June of last year.  ED Course: Hypoxic requiring 5L o2 in ED, hypercapnic and put on BIPAP for Ph 7.29 and PCO2 of 74.    Subjective:    Deborah [name] has, No headache, No chest pain, No abdominal pain - No Nausea, No new weakness tingling or numbness, No Cough - Improved SOB.    Assessment  & Plan :     1.Acute on chronic hypoxic and hypercapnic respiratory failure due to combination of COPD exacerbation and community-acquired pneumonia. An tinea BiPAP, settings changed to 18/10, Fio2 lowest to keep pulse ox around 90. Continue IV steroids, continue empiric antibiotics, follow cultures, continue nebulizer treatments. Pulmonary critical care also requested to evaluate the patient.  2. COPD with pulmonary hypertension. In excess ablation, advanced at baseline, patient uses 4-5 L nasal cannula oxygen. Plan as above.  3. Chronic grade 1 diastolic CHF. EF 55-60%. Currently compensated.  4. Obstructive sleep apnea. For now on BiPAP.  5. Morbid obesity. Follow with PCP for weight loss   Code Status :   Full  Family Communication  :  None  Disposition Plan  : Step down  Consults  :  PCCM  Procedures  :   DVT Prophylaxis  :  Lovenox   Lab Results  Component Value Date   PLT 254 12/18/2015    Inpatient Medications  Scheduled Meds: . albuterol      .  antiseptic oral rinse  7 mL Mouth Rinse q12n4p  . aspirin  81 mg Oral Daily  . azithromycin  500 mg Oral QHS  . cefTRIAXone (ROCEPHIN)  IV  1 g Intravenous QHS  . chlorhexidine  15 mL Mouth Rinse BID  . diltiazem  120 mg Oral Daily  . enoxaparin (LOVENOX) injection  50 mg Subcutaneous Daily  . ipratropium      . ipratropium-albuterol  3 mL Nebulization Q6H  . methylPREDNISolone (SOLU-MEDROL) injection  80 mg Intravenous Q6H  . montelukast  10 mg Oral QHS  . pyridOXINE  100 mg Oral Daily  . vitamin B-12  1,000 mcg Oral Daily   Continuous Infusions:  PRN Meds:.  Antibiotics  :    Anti-infectives    Start     Dose/Rate Route Frequency Ordered Stop   12/18/15 2200  cefTRIAXone (ROCEPHIN) 1 g in dextrose 5 % 50 mL IVPB     1 g 100 mL/hr over 30 Minutes Intravenous Daily at bedtime 12/18/15 0136 12/25/15 2159   12/18/15 2200  azithromycin (ZITHROMAX) tablet 500 mg     500 mg Oral Daily at bedtime 12/18/15 0136 12/25/15 2159   12/18/15 0130  cefTRIAXone (ROCEPHIN) 1 g in dextrose 5 % 50 mL IVPB     1 g 100 mL/hr over 30 Minutes Intravenous  Once 12/18/15 0125 12/18/15 0333   12/18/15 0130  azithromycin (ZITHROMAX) 500 mg in dextrose 5 % 250 mL IVPB     500 mg 250 mL/hr over 60 Minutes Intravenous  Once 12/18/15 0125 12/18/15 0404         Objective:   Filed Vitals:   12/18/15 0600 12/18/15 0700 12/18/15 0800 12/18/15 0816  BP: 134/68 138/70 123/71 123/71  Pulse: 93 96 82 86  Temp:   98.4 F (36.9 C)   TempSrc:   Oral   Resp: '23 23 17 22  '$ Height:      Weight:      SpO2: 96% 95% 95% 92%    Wt Readings from Last 3 Encounters:  12/18/15 98.5 kg (217 lb 2.5 oz)  11/29/15 101.039 kg (222 lb 12 oz)  06/01/15  102.967 kg (227 lb)     Intake/Output Summary (Last 24 hours) at 12/18/15 0913 Last data filed at 12/18/15 0800  Gross per 24 hour  Intake    310 ml  Output      0 ml  [name]    310 ml     Physical Exam  Awake Alert,  No new F.N deficits, Normal affect, Wearing BiPAP [place].AT,PERRAL Supple Neck,No JVD, No cervical lymphadenopathy appriciated.  Symmetrical Chest wall movement, Good air movement bilaterally, Mod wheezing RRR,No Gallops,Rubs or new Murmurs, No Parasternal Heave +ve B.Sounds, Abd Soft, No tenderness, No organomegaly appriciated, No rebound - guarding or rigidity. No Cyanosis, Clubbing or edema, No new Rash or bruise      Data Review:    CBC  Recent Labs Lab 12/18/15 0003 12/18/15 0713  WBC 14.5* 14.8*  HGB 14.5 14.1  HCT 46.5* 45.6  PLT 259 254  MCV 98.1 98.1  MCH 30.6 30.3  MCHC 31.2 30.9  RDW 14.9 15.1  LYMPHSABS 2.5  --   MONOABS 1.0  --   EOSABS 0.1  --   BASOSABS 0.1  --     Chemistries   Recent Labs Lab 12/18/15 0003 12/18/15 0713  NA 137 136  K 3.5 3.2*  CL 93* 89*  CO2 34* 33*  GLUCOSE 111* 189*  BUN  15 15  CREATININE 0.86 0.87  CALCIUM 8.5* 8.7*  AST 23  --   ALT 21  --   ALKPHOS 73  --   BILITOT 0.5  --    ------------------------------------------------------------------------------------------------------------------ No results for input(s): CHOL, HDL, LDLCALC, TRIG, CHOLHDL, LDLDIRECT in the last 72 hours.  Lab Results  Component Value Date   HGBA1C 6.2* 07/25/2014   ------------------------------------------------------------------------------------------------------------------ No results for input(s): TSH, T4TOTAL, T3FREE, THYROIDAB in the last 72 hours.  Invalid input(s): FREET3 ------------------------------------------------------------------------------------------------------------------ No results for input(s): VITAMINB12, FOLATE, FERRITIN, TIBC, IRON, RETICCTPCT in the last 72 hours.  Coagulation profile No  results for input(s): INR, PROTIME in the last 168 hours.  No results for input(s): DDIMER in the last 72 hours.  Cardiac Enzymes No results for input(s): CKMB, TROPONINI, MYOGLOBIN in the last 168 hours.  Invalid input(s): CK ------------------------------------------------------------------------------------------------------------------    Component Value Date/Time   BNP 166.0* 12/18/2015 0003    Micro Results Recent Results (from the past 240 hour(s))  MRSA PCR Screening     Status: None   Collection Time: 12/18/15  3:24 AM  Result Value Ref Range Status   MRSA by PCR NEGATIVE NEGATIVE Final    Comment:        The GeneXpert MRSA Assay (FDA approved for NASAL specimens only), is one component of a comprehensive MRSA colonization surveillance program. It is not intended to diagnose MRSA infection nor to guide or monitor treatment for MRSA infections.     Radiology Reports Dg Chest Port 1 View  12/18/2015  CLINICAL DATA:  Decreased oxygen saturations EXAM: PORTABLE CHEST 1 VIEW COMPARISON:  12/18/2015 FINDINGS: Normal heart size. There is diminished aeration to the left lung base. Opacity noted within the right infrahilar region appears improved from previous exam. Aortic atherosclerosis noted. IMPRESSION: 1. Left base opacity which may represent pneumonia or atelectasis. Electronically Signed   By: Kerby Moors M.D.   On: 12/18/2015 07:25   Dg Chest Port 1 View  12/18/2015  CLINICAL DATA:  Shortness of breath EXAM: PORTABLE CHEST 1 VIEW COMPARISON:  07/28/2014 FINDINGS: Bronchitic markings and hyperinflation. Streaky right infrahilar opacity. No edema, effusion, or pneumothorax. No suspected cardiomegaly when accounting for mediastinal fat pad. IMPRESSION: 1. Right infrahilar atelectasis or pneumonia. 2. COPD. Electronically Signed   By: Monte Fantasia M.D.   On: 12/18/2015 00:24    Time Spent in minutes  30   Lasheika Ortloff K M.D on 12/18/2015 at 9:13 AM  Between 7am  to 7pm - Pager - [phone]  After 7pm go to [email or web address] - password [email or web address]  [facility] -  Office  [phone]

## 2015-12-18 NOTE — Consult Note (Signed)
PULMONARY / CRITICAL CARE MEDICINE   Name: Deborah [name] MRN: [id number] DOB: [birth year]    ADMISSION DATE:  12/17/2015 CONSULTATION DATE:  12/18/2015  REFERRING MD:  Dr. Candiss Norse  CHIEF COMPLAINT:  Short of breath  HISTORY OF PRESENT ILLNESS:   60 yo female smoker developed sinus congestion, post nasal drip and cough.  She also developed shakes/chills with subjective fever.  She was getting more sleepy and falling at home.  She was brought to ER >> hypoxic, wheezing.  She had ABG which showed acute on chronic hypoxia/hypercapnia.  She was started on BiPAP.  She is followed by Dr. Elsworth Soho for COPD, chronic respiratory failure on 4 liters oxygen, and OSA.  She is non compliant with CPAP >> says mask just keeps coming off when asleep.  She denies smoking, but says she still cheats from time to time.  PAST MEDICAL HISTORY :  She  has a past medical history of Umbilical hernia; Asthma; Anxiety; Depression; Shortness of breath; Pneumonia; Arthritis; Anemia; PONV (postoperative nausea and vomiting); Family history of anesthesia complication; Histoplasmosis; Hypertension; Restrictive lung disease; COPD, mild ([name]); Heart failure ([place]); Obesity (BMI 30-39.9); Tobacco abuse; Hyperkalemia; and CHF (congestive heart failure) ([place]).  PAST SURGICAL HISTORY: She  has past surgical history that includes Cesarean section (04/12/85); Ventral hernia repair (N/A, 03/19/2013); Insertion of mesh (N/A, 07/19/2438); and Application if wound vac (03/19/2013).  No Known Allergies  No current facility-administered medications on file prior to encounter.   Current Outpatient Prescriptions on File Prior to Encounter  Medication Sig  . aspirin 81 MG chewable tablet Chew 1 tablet (81 mg total) by mouth daily.  Marland Kitchen CARTIA XT 120 MG 24 hr capsule TAKE ONE CAPSULE BY MOUTH DAILY (Patient taking differently: TAKE 120 MG BY MOUTH DAILY)  . furosemide (LASIX) 40 MG tablet TAKE 1 TABLET BY MOUTH EVERY DAY (Patient taking differently:  TAKE 40 MG BY MOUTH EVERY DAY)  . montelukast (SINGULAIR) 10 MG tablet TAKE 1 TABLET BY MOUTH EVERY NIGHT AT BEDTIME (Patient taking differently: TAKE 10 MG BY MOUTH EVERY NIGHT AT BEDTIME)  . potassium chloride SA (K-DUR,KLOR-CON) 20 MEQ tablet TAKE 1/2 TABLET(10 MEQ) BY MOUTH DAILY (Patient taking differently: Take 10 mEq by mouth daily. )    FAMILY HISTORY:  Her indicated that her mother is deceased. She indicated that her father is deceased.   SOCIAL HISTORY: She  reports that she quit smoking about 3 years ago. Her smoking use included Cigarettes. She has a 129 pack-year smoking history. She does not have any smokeless tobacco history on file. She reports that she drinks alcohol. She reports that she does not use illicit drugs.  REVIEW OF SYSTEMS:   Denies chest pain, abdominal pain, leg swelling, skin rash  SUBJECTIVE:  Feels like BiPAP has helped, but mask feels tight.  VITAL SIGNS: BP 126/77 mmHg  Pulse 81  Temp(Src) 98.4 F (36.9 C) (Oral)  Resp 19  Ht '5\' 4"'$  (1.626 m)  Wt 217 lb 2.5 oz (98.5 kg)  BMI 37.26 kg/m2  SpO2 94%  HEMODYNAMICS:    VENTILATOR SETTINGS: Vent Mode:  [-] PCV;BIPAP FiO2 (%):  [40 %-60 %] 50 % Set Rate:  [12 bmp-16 bmp] 14 bmp PEEP:  [6 cmH20-10 cmH20] 10 cmH20  INTAKE / OUTPUT: I/O last 3 completed shifts: In: 300 [IV Piggyback:300] Out: -   PHYSICAL EXAMINATION: General:  Alert Neuro:  Follows commands, normal strength, no tremor HEENT: BiPAP mask on Cardiovascular:  Regular, no murmur Lungs:  Decreased bs,  poor air movement, no wheeze Abdomen:  Soft, non tender, +BS Musculoskeletal:  No edema Skin:  No rashes  LABS:  BMET  Recent Labs Lab 12/18/15 0003 12/18/15 0713  NA 137 136  K 3.5 3.2*  CL 93* 89*  CO2 34* 33*  BUN 15 15  CREATININE 0.86 0.87  GLUCOSE 111* 189*    Electrolytes  Recent Labs Lab 12/18/15 0003 12/18/15 0713  CALCIUM 8.5* 8.7*    CBC  Recent Labs Lab 12/18/15 0003 12/18/15 0713  WBC  14.5* 14.8*  HGB 14.5 14.1  HCT 46.5* 45.6  PLT 259 254    Coag's No results for input(s): APTT, INR in the last 168 hours.  Sepsis Markers  Recent Labs Lab 12/18/15 0246 12/18/15 0713  LATICACIDVEN 2.63* 2.1*    ABG  Recent Labs Lab 12/18/15 0037 12/18/15 0751  PHART 7.289* 7.253*  PCO2ART 74.8* 85.4*  PO2ART 68.4* 77.0*    Liver Enzymes  Recent Labs Lab 12/18/15 0003  AST 23  ALT 21  ALKPHOS 73  BILITOT 0.5  ALBUMIN 3.3*    Cardiac Enzymes No results for input(s): TROPONINI, PROBNP in the last 168 hours.  Glucose No results for input(s): GLUCAP in the last 168 hours.  Imaging Dg Chest Port 1 View  12/18/2015  CLINICAL DATA:  Decreased oxygen saturations EXAM: PORTABLE CHEST 1 VIEW COMPARISON:  12/18/2015 FINDINGS: Normal heart size. There is diminished aeration to the left lung base. Opacity noted within the right infrahilar region appears improved from previous exam. Aortic atherosclerosis noted. IMPRESSION: 1. Left base opacity which may represent pneumonia or atelectasis. Electronically Signed   By: Kerby Moors M.D.   On: 12/18/2015 07:25   Dg Chest Port 1 View  12/18/2015  CLINICAL DATA:  Shortness of breath EXAM: PORTABLE CHEST 1 VIEW COMPARISON:  07/28/2014 FINDINGS: Bronchitic markings and hyperinflation. Streaky right infrahilar opacity. No edema, effusion, or pneumothorax. No suspected cardiomegaly when accounting for mediastinal fat pad. IMPRESSION: 1. Right infrahilar atelectasis or pneumonia. 2. COPD. Electronically Signed   By: Monte Fantasia M.D.   On: 12/18/2015 00:24     STUDIES:  06/05/13 PFT >> FEV1 1.58 (60%), FEV1% 81, TLC 4.13 (84%), DLCO 60% 07/26/14 Echo >> EF 50 to 00%, grade 1 diastolic CHF, severe RV dilation, mod TR, PAS 38 mmHg 09/07/14 PSG >> AHI 6.8  CULTURES: 6/03 Blood >>   ANTIBIOTICS: 6/02 Zithromax >>  6/02 Rocephin >>   SIGNIFICANT EVENTS: 6/02 Admit, BiPAP  LINES/TUBES:  DISCUSSION: 60 yo female smoker  with acute on chronic hypoxic/hypercapnic respiratory failure from AECOPD, sleep disordered breathing.  ASSESSMENT / PLAN:  Acute on chronic hypoxic/hypercapnic respiratory failure. - oxygen to keep SpO2 88 to 95% - BiPAP qhs and prn during the day - limit ABGs >> used use mental status as gauge for response to therapy  COPD exacerbation. - continue solumedrol - scheduled BDs - day 2 of antibiotics  Tobacco abuse. - discussed importance of smoking cessation  Sleep disordered breathing. - BiPAP qhs  Rhinitis. - nasal irrigation, flonase, singulair, claritin  D/w Dr. Candiss Norse.  CC time 33 minutes.  Chesley Mires, MD [facility] Pulmonary/Critical Care 12/18/2015, 11:05 AM Pager:  [phone] After 3pm call: [phone]

## 2015-12-18 NOTE — Progress Notes (Signed)
Patient was not tolerating non invasive auto titrate BIPAP. New BIPAP setting are 16/6, r 16, and 60%. Patient is tolerating well.

## 2015-12-18 NOTE — ED Notes (Signed)
Below order not completed by EW. 

## 2015-12-18 NOTE — Progress Notes (Signed)
CRITICAL VALUE ALERT  Critical value received:  Lactic Acid: 2.1  Date of notification:  12/18/2015  Time of notification: 6789  Critical value read back:Yes.    Nurse who received alert:  Leanna Sato, RN  MD notified (1st page): Candiss Norse  Time of first page: [phone]  Time MD responded:  [phone]

## 2015-12-18 NOTE — H&P (Signed)
History and Physical    Deborah [name] QQI:[id number] DOB: Dec 08, 1955 DOA: 12/17/2015   PCP: [name], MD Chief Complaint:  Chief Complaint  Patient presents with  . Fall    HPI: Deborah [name] is a 60 y.o. female with medical history significant of COPD patient is on 4L O2 at home at baseline.  Patient presents to the ED with c/o SOB, cough, congestion.  Cough is productive.  Patient had multiple falls PTA recently due to SOB.  Last hospital stay for COPD exacerbation was June of last year.  ED Course: Hypoxic requiring 5L o2 in ED, hypercapnic and put on BIPAP for Ph 7.29 and PCO2 of 74.  Review of Systems: As per HPI otherwise 10 point review of systems negative.    Past Medical History  Diagnosis Date  . Umbilical hernia   . Asthma   . Anxiety   . Depression   . Shortness of breath   . Pneumonia     hx of  . Arthritis   . Anemia     as teen  . PONV (postoperative nausea and vomiting)   . Family history of anesthesia complication     vomiting  . Histoplasmosis     left eye  . Hypertension   . Restrictive lung disease   . COPD, mild ([place])   . Heart failure ([place])     New onset 07/25/14  . Obesity (BMI 30-39.9)   . Tobacco abuse   . Hyperkalemia   . CHF (congestive heart failure) [facility])     Past Surgical History  Procedure Laterality Date  . Cesarean section  04/12/85  . Ventral hernia repair N/A 03/19/2013    Procedure:  OPEN VENTRAL HERNIA REPAIR WITH MESH AND APPLICATION OF WOUND VAC;  Surgeon: Madilyn Hook, DO;  Location: [facility] ORS;  Service: General;  Laterality: N/A;  . Insertion of mesh N/A 03/19/2013    Procedure: INSERTION OF MESH;  Surgeon: Madilyn Hook, DO;  Location: [facility] ORS;  Service: General;  Laterality: N/A;  . Application of wound vac  03/19/2013    Procedure: APPLICATION OF WOUND VAC;  Surgeon: Madilyn Hook, DO;  Location: [facility] ORS;  Service: General;;     reports that she quit smoking about 3 years ago. Her smoking use included Cigarettes. She has a  129 pack-year smoking history. She does not have any smokeless tobacco history on file. She reports that she drinks alcohol. She reports that she does not use illicit drugs.  No Known Allergies  Family History  Problem Relation Age of Onset  . Heart attack Mother   . Emphysema Mother     was a smoker  . Lymphoma Maternal Uncle   . Pancreatic cancer Maternal Aunt   . Emphysema      Mother  . Heart failure      Father  . Cancer      Unknown     Prior to Admission medications   Medication Sig Start Date End Date Taking? Authorizing Provider  aspirin 81 MG chewable tablet Chew 1 tablet (81 mg total) by mouth daily. 10/05/14  Yes [name], MD  CARTIA XT 120 MG 24 hr capsule TAKE ONE CAPSULE BY MOUTH DAILY Patient taking differently: TAKE 120 MG BY MOUTH DAILY 12/09/15  Yes [name], MD  Cholecalciferol (VITAMIN D-3) 1000 units CAPS Take 1,000 Units by mouth daily.   Yes Historical Provider, MD  furosemide (LASIX) 40 MG tablet TAKE 1 TABLET BY MOUTH EVERY  DAY Patient taking differently: TAKE 40 MG BY MOUTH EVERY DAY 12/09/15  Yes [name], MD  montelukast (SINGULAIR) 10 MG tablet TAKE 1 TABLET BY MOUTH EVERY NIGHT AT BEDTIME Patient taking differently: TAKE 10 MG BY MOUTH EVERY NIGHT AT BEDTIME 12/29/14  Yes [name], MD  potassium chloride SA (K-DUR,KLOR-CON) 20 MEQ tablet TAKE 1/2 TABLET(10 MEQ) BY MOUTH DAILY Patient taking differently: Take 10 mEq by mouth daily.  06/17/15  Yes [name], MD  pyridOXINE (VITAMIN B-6) 100 MG tablet Take 100 mg by mouth daily.   Yes Historical Provider, MD  vitamin B-12 (CYANOCOBALAMIN) 1000 MCG tablet Take 1,000 mcg by mouth daily.   Yes Historical Provider, MD    Physical Exam: Filed Vitals:   12/17/15 2329 12/17/15 2342 12/18/15 0030 12/18/15 0053  BP:   124/64   Pulse: 96  105   Temp:    98.5 F (36.9 C)  TempSrc:    Oral  Resp:   29   SpO2: 93% 94% 95%       Constitutional: NAD, calm, comfortable Eyes: PERRL,  lids and conjunctivae normal ENMT: Mucous membranes are moist. Posterior pharynx clear of any exudate or lesions.Normal dentition.  Neck: normal, supple, no masses, no thyromegaly Respiratory: Diffuse wheezes and crackles. Cardiovascular: Regular rate and rhythm, no murmurs / rubs / gallops. No extremity edema. 2+ pedal pulses. No carotid bruits.  Abdomen: no tenderness, no masses palpated. No hepatosplenomegaly. Bowel sounds positive.  Musculoskeletal: no clubbing / cyanosis. No joint deformity upper and lower extremities. Good ROM, no contractures. Normal muscle tone.  Skin: no rashes, lesions, ulcers. No induration Neurologic: CN 2-12 grossly intact. Sensation intact, DTR normal. Strength 5/5 in all 4.  Psychiatric: Normal judgment and insight. Alert and oriented x 3. Normal mood.    Labs on Admission: I have personally reviewed following labs and imaging studies  CBC:  Recent Labs Lab 12/18/15 0003  WBC 14.5*  NEUTROABS 10.8*  HGB 14.5  HCT 46.5*  MCV 98.1  PLT 263   Basic Metabolic Panel:  Recent Labs Lab 12/18/15 0003  NA 137  K 3.5  CL 93*  CO2 34*  GLUCOSE 111*  BUN 15  CREATININE 0.86  CALCIUM 8.5*   GFR: CrCl cannot be calculated (Unknown ideal weight.). Liver Function Tests:  Recent Labs Lab 12/18/15 0003  AST 23  ALT 21  ALKPHOS 73  BILITOT 0.5  PROT 7.2  ALBUMIN 3.3*   No results for input(s): LIPASE, AMYLASE in the last 168 hours. No results for input(s): AMMONIA in the last 168 hours. Coagulation Profile: No results for input(s): INR, PROTIME in the last 168 hours. Cardiac Enzymes: No results for input(s): CKTOTAL, CKMB, CKMBINDEX, TROPONINI in the last 168 hours. BNP (last 3 results) No results for input(s): PROBNP in the last 8760 hours. HbA1C: No results for input(s): HGBA1C in the last 72 hours. CBG: No results for input(s): GLUCAP in the last 168 hours. Lipid Profile: No results for input(s): CHOL, HDL, LDLCALC, TRIG, CHOLHDL,  LDLDIRECT in the last 72 hours. Thyroid Function Tests: No results for input(s): TSH, T4TOTAL, FREET4, T3FREE, THYROIDAB in the last 72 hours. Anemia Panel: No results for input(s): VITAMINB12, FOLATE, FERRITIN, TIBC, IRON, RETICCTPCT in the last 72 hours. Urine analysis:    Component Value Date/Time   COLORURINE YELLOW 10/04/2010 2351   APPEARANCEUR CLEAR 10/04/2010 2351   LABSPEC 1.008 10/04/2010 2351   PHURINE 6.0 10/04/2010 2351   GLUCOSEU NEGATIVE 10/04/2010 2351  HGBUR NEGATIVE 10/04/2010 2351   BILIRUBINUR NEGATIVE 10/04/2010 2351   KETONESUR NEGATIVE 10/04/2010 2351   PROTEINUR NEGATIVE 10/04/2010 2351   UROBILINOGEN 0.2 10/04/2010 2351   NITRITE NEGATIVE 10/04/2010 2351   LEUKOCYTESUR  10/04/2010 2351    NEGATIVE MICROSCOPIC NOT DONE ON URINES WITH NEGATIVE PROTEIN, BLOOD, LEUKOCYTES, NITRITE, OR GLUCOSE <1000 mg/dL.   Sepsis Labs: '@LABRCNTIP'$ (procalcitonin:4,lacticidven:4) )No results found for this or any previous visit (from the past 240 hour(s)).   Radiological Exams on Admission: Dg Chest Port 1 View  12/18/2015  CLINICAL DATA:  Shortness of breath EXAM: PORTABLE CHEST 1 VIEW COMPARISON:  07/28/2014 FINDINGS: Bronchitic markings and hyperinflation. Streaky right infrahilar opacity. No edema, effusion, or pneumothorax. No suspected cardiomegaly when accounting for mediastinal fat pad. IMPRESSION: 1. Right infrahilar atelectasis or pneumonia. 2. COPD. Electronically Signed   By: Monte Fantasia M.D.   On: 12/18/2015 00:24    EKG: Independently reviewed.  Assessment/Plan Principal Problem:   CAP (community acquired pneumonia) Active Problems:   Chronic diastolic heart failure (HCC)   Acute on chronic respiratory failure with hypoxia and hypercapnia (HCC)   COPD exacerbation (HCC)   Acute on chronic resp failure with hypoxia and hypercapnia - due to CAP and COPD exacerbation  BIPAP  CAP -  PNA pathway  Rocephin and azithromycin  Gentle hydration and holding  lasix / potassium  COPD exacerbation -  Adult wheeze protocol  Prednisone  Chronic CHF -  Holding lasix and potassium and gently hydrating, watch for signs of fluid overload during stay.     DVT prophylaxis: Lovenox Code Status: Full Family Communication: Husband at bedside Consults called: None Admission status: Admit to inpatient   Etta Quill DO [facility] Pager [phone] from 7PM-7AM  If 7AM-7PM, please contact the day physician for the patient [email or web address] Password [email or web address]  12/18/2015, 1:48 AM

## 2015-12-18 NOTE — Progress Notes (Signed)
Panic ABG result as follows given to Shirlyn Goltz, MD: pH: 7.289 PaCO2: 74.8 PaO2: 68.4 SaO2: 90.5 bicarbonate 34.7.

## 2015-12-18 NOTE — Progress Notes (Signed)
Panic VBG result as follows given to Shirlyn Goltz, MD: pH: 7.32 PvCO2: 71.1 PvO2: 59.1 SvO2 87.5 bicarbonate 35.6.

## 2015-12-18 NOTE — Progress Notes (Signed)
Rx Brief Lovenox note   Wt=101 kg, CrCl>30 ml/min, BMI=39  Rx adjusted Lovenox to '50mg'$  daily (~0.'5mg'$  /kg) in pt with BMI>30  Thanks Dorrene German 12/18/2015 3:31 AM

## 2015-12-18 NOTE — ED Notes (Signed)
RN will notify Admitting Dr. patient lactic result.

## 2015-12-18 NOTE — Progress Notes (Signed)
Order received from EDP for Auto titration BiPAP. Full face mask utilized. Placed on Imax 25 Emin 8 with a minPS 6 to ensure a minimum of IPAP 14 EPAP 8. 6 liter per minute O2 bleed in line. VSS at this time. Family member remains at bedside. Questions answered and eduction provided on CPAP/BiPAP as well as OSA/OHS.

## 2015-12-18 NOTE — Progress Notes (Signed)
Patient has documented history of CHF and COPD. Therefore, does not meet criteria for the adult wheeze protocol. RT treatment assessment protocol completed. Orders changed/added accordingly. RT will continue to follow.

## 2015-12-19 ENCOUNTER — Inpatient Hospital Stay (HOSPITAL_COMMUNITY): Payer: 59

## 2015-12-19 LAB — BASIC METABOLIC PANEL
Anion gap: 6 (ref 5–15)
BUN: 14 mg/dL (ref 6–20)
CO2: 37 mmol/L — ABNORMAL HIGH (ref 22–32)
Calcium: 9 mg/dL (ref 8.9–10.3)
Chloride: 96 mmol/L — ABNORMAL LOW (ref 101–111)
Creatinine, Ser: 0.64 mg/dL (ref 0.44–1.00)
GFR calc Af Amer: >60 mL/min (ref >60)
GFR calc non Af Amer: >60 mL/min (ref >60)
Glucose, Bld: 150 mg/dL — ABNORMAL HIGH (ref 65–99)
Potassium: 4.3 mmol/L (ref 3.5–5.1)
Sodium: 139 mmol/L (ref 135–145)

## 2015-12-19 LAB — CBC
HCT: 43.7 % (ref 36.0–46.0)
Hemoglobin: 13.4 g/dL (ref 12.0–15.0)
MCH: 30 pg (ref 26.0–34.0)
MCHC: 30.7 g/dL (ref 30.0–36.0)
MCV: 97.8 fL (ref 78.0–100.0)
Platelets: 271 10*3/uL (ref 150–400)
RBC: 4.47 MIL/uL (ref 3.87–5.11)
RDW: 14.8 % (ref 11.5–15.5)
WBC: 17.9 10*3/uL — ABNORMAL HIGH (ref 4.0–10.5)

## 2015-12-19 LAB — MAGNESIUM: Magnesium: 2.1 mg/dL (ref 1.7–2.4)

## 2015-12-19 MED ORDER — IPRATROPIUM-ALBUTEROL 0.5-2.5 (3) MG/3ML IN SOLN
3.0000 mL | Freq: Three times a day (TID) | RESPIRATORY_TRACT | Status: DC
  Administered 2015-12-19 – 2015-12-21 (×5): 3 mL via RESPIRATORY_TRACT
  Filled 2015-12-19 (×5): qty 3

## 2015-12-19 NOTE — Evaluation (Signed)
Physical Therapy Evaluation Patient Details Name: Deborah [name] MRN: [id number] DOB: 12-Jun-1956 Today's Date: 12/19/2015   History of Present Illness  Deborah [name] is a 60 y.o. female with medical history significant of COPD patient is on 4L O2 at home at baseline. Patient presents to the ED 12/17/15  with c/o SOB, cough, congestion, weakness and falls. Cough is productive. Hypoxic requiring 5L o2 in ED, hypercapnic and put on BIPAP   Clinical Impression  The patient is motivated today but leary of  Leg weakness. She reports  That her strength is much improved as she had buckled x 3 PTA.  Pt admitted with above diagnosis. Pt currently with functional limitations due to the deficits listed below (see PT Problem List).  Pt will benefit from skilled PT to increase their independence and safety with mobility to allow discharge to the venue listed below.       Follow Up Recommendations Home health PT;Supervision/Assistance - 24 hour    Equipment Recommendations  Rolling walker with 5" wheels    Recommendations for Other Services OT consult     Precautions / Restrictions Precautions Precautions: Fall Precaution Comments: on oxygen, monitor sats and HR      Mobility  Bed Mobility Overal bed mobility: Needs Assistance Bed Mobility: Supine to Sit     Supine to sit: Supervision     General bed mobility comments: extra time  Transfers Overall transfer level: Needs assistance Equipment used: Rolling walker (2 wheeled) Transfers: Sit to/from [facility] Sit to Stand: Min assist Stand pivot transfers: Min assist       General transfer comment: patient fearful of knees buckling but found them steady and able to take steps to recliner.   Ambulation/Gait Ambulation/Gait assistance: Min assist   Assistive device: Rolling walker (2 wheeled)       General Gait Details: march in place x 15 steps. HR 70, sats 85% on 5 liters. sats back to 91% at  rest  Stairs            Wheelchair Mobility    Modified Rankin (Stroke Patients Only)       Balance                                             Pertinent Vitals/Pain Pain Assessment: No/denies pain    Home Living Family/patient expects to be discharged to:: Private residence Living Arrangements: Spouse/significant other Available Help at Discharge: Family Type of Home: Apartment Home Access: Stairs to enter Entrance Stairs-Rails: Right Entrance Stairs-Number of Steps: 4 Home Layout: One level Home Equipment: Shower seat      Prior Function Level of Independence: Needs assistance   Gait / Transfers Assistance Needed: household ambulation without device.  ADL's / Homemaking Assistance Needed: assist at times in shower        Hand Dominance        Extremity/Trunk Assessment   Upper Extremity Assessment: Generalized weakness           Lower Extremity Assessment: Generalized weakness      Cervical / Trunk Assessment: Normal  Communication   Communication: No difficulties  Cognition Arousal/Alertness: Awake/alert Behavior During Therapy: WFL for tasks assessed/performed Overall Cognitive Status: Within Functional Limits for tasks assessed                      General Comments  Exercises        Assessment/Plan    PT Assessment Patient needs continued PT services  PT Diagnosis Difficulty walking;Generalized weakness   PT Problem List Decreased strength;Decreased activity tolerance;Decreased mobility;Cardiopulmonary status limiting activity;Decreased knowledge of precautions;Decreased safety awareness  PT Treatment Interventions DME instruction;Gait training;Functional mobility training;Therapeutic activities;Therapeutic exercise;Patient/family education   PT Goals (Current goals can be found in the Care Plan section) Acute Rehab PT Goals Patient Stated Goal: to get my legs stronger PT Goal Formulation: With  patient/family Time For Goal Achievement: 01/02/16 Potential to Achieve Goals: Good    Frequency Min 3X/week   Barriers to discharge        Co-evaluation               End of Session Equipment Utilized During Treatment: Gait belt Activity Tolerance: Patient tolerated treatment well Patient left: in chair;with call bell/phone within reach;with chair alarm set;with family/visitor present Nurse Communication: Mobility status         Time: 1518-3437 PT Time Calculation (min) (ACUTE ONLY): 23 min   Charges:   PT Evaluation $PT Eval Low Complexity: 1 Procedure PT Treatments $Therapeutic Activity: 8-22 mins   PT G Codes:        Marcelino Freestone PT [phone]  12/19/2015, 9:44 AM

## 2015-12-19 NOTE — Progress Notes (Signed)
PROGRESS NOTE                                                                                                                                                                                                             Patient Demographics:    Deborah [name], is a 60 y.o. female, DOB - [birth year], ZYS:[id number]  Admit date - 12/17/2015   Admitting Physician Etta Quill, DO  Outpatient Primary MD for the patient is [name], MD  LOS - 1  Chief Complaint  Patient presents with  . Fall       Brief Narrative    Deborah [name] is a 60 y.o. female with medical history significant of COPD patient is on 4L O2 at home at baseline. Patient presents to the ED with c/o SOB, cough, congestion. Cough is productive. Patient had multiple falls PTA recently due to SOB. Last hospital stay for COPD exacerbation was June of last year.  ED Course: Hypoxic requiring 5L o2 in ED, hypercapnic and put on BIPAP for Ph 7.29 and PCO2 of 74.    Subjective:    Deborah [name] today has, No headache, No chest pain, No abdominal pain - No Nausea, No new weakness tingling or numbness, No Cough - Improved SOB.    Assessment  & Plan :     1.Acute on chronic hypoxic and hypercapnic respiratory failure due to combination of COPD exacerbation and community-acquired pneumonia with Encephaloapthy. Continue BiPAP, settings changed to 18/10, Fio2 lowest to keep pulse ox around 90. Continue IV steroids, continue empiric antibiotics, follow cultures, continue nebulizer treatments. Pulmonary critical care also folowing. Hypercarbic Encephalopathy much improved.  2. COPD with pulmonary hypertension. In excess ablation, advanced at baseline, patient uses 4-5 L nasal cannula oxygen. Plan as above.  3. Chronic grade 1 diastolic CHF. EF 55-60%. Currently compensated.  4. Obstructive sleep apnea. For now on BiPAP. Non complaint at home. Counseled.  5.  Morbid obesity. Follow with PCP for weight loss.  6.Smoking - counseled to quit.     Code Status :  Full  Family Communication  :  Husband bedside 12-18-15  Disposition Plan  : Step down  Consults  :  PCCM  Procedures  :   DVT Prophylaxis  :  Lovenox   Lab Results  Component Value Date   PLT 271 12/19/2015  Inpatient Medications  Scheduled Meds: . antiseptic oral rinse  7 mL Mouth Rinse q12n4p  . aspirin  81 mg Oral Daily  . azithromycin  500 mg Oral QHS  . cefTRIAXone (ROCEPHIN)  IV  1 g Intravenous QHS  . chlorhexidine  15 mL Mouth Rinse BID  . diltiazem  120 mg Oral Daily  . enoxaparin (LOVENOX) injection  50 mg Subcutaneous Daily  . fluticasone  2 spray Each Nare Daily  . ipratropium-albuterol  3 mL Nebulization Q6H  . loratadine  10 mg Oral Daily  . methylPREDNISolone (SOLU-MEDROL) injection  80 mg Intravenous Q6H  . montelukast  10 mg Oral QHS  . pyridOXINE  100 mg Oral Daily  . sodium chloride  2 spray Each Nare BID  . vitamin B-12  1,000 mcg Oral Daily   Continuous Infusions: . dextrose 5 % and 0.45% NaCl 50 mL/hr at 12/18/15 1025   PRN Meds:.  Antibiotics  :    Anti-infectives    Start     Dose/Rate Route Frequency Ordered Stop   12/18/15 2200  cefTRIAXone (ROCEPHIN) 1 g in dextrose 5 % 50 mL IVPB     1 g 100 mL/hr over 30 Minutes Intravenous Daily at bedtime 12/18/15 0136 12/25/15 2159   12/18/15 2200  azithromycin (ZITHROMAX) tablet 500 mg     500 mg Oral Daily at bedtime 12/18/15 0136 12/25/15 2159   12/18/15 0130  cefTRIAXone (ROCEPHIN) 1 g in dextrose 5 % 50 mL IVPB     1 g 100 mL/hr over 30 Minutes Intravenous  Once 12/18/15 0125 12/18/15 0333   12/18/15 0130  azithromycin (ZITHROMAX) 500 mg in dextrose 5 % 250 mL IVPB     500 mg 250 mL/hr over 60 Minutes Intravenous  Once 12/18/15 0125 12/18/15 0404         Objective:   Filed Vitals:   12/19/15 0400 12/19/15 0500 12/19/15 0600 12/19/15 0752  BP: 100/59 105/71 112/61 99/64  Pulse:  63 59 67 64  Temp: 97.1 F (36.2 C)     TempSrc: Axillary     Resp: '21 21 20 28  '$ Height:      Weight:      SpO2: 93% 93% 95% 97%    Wt Readings from Last 3 Encounters:  12/18/15 98.5 kg (217 lb 2.5 oz)  11/29/15 101.039 kg (222 lb 12 oz)  06/01/15 102.967 kg (227 lb)     Intake/Output Summary (Last 24 hours) at 12/19/15 0804 Last data filed at 12/19/15 0600  Gross per 24 hour  Intake 1049.17 ml  Output    350 ml  [name] 699.17 ml     Physical Exam  Awake Alert,  No new F.N deficits, Normal affect, Wearing BiPAP [place].AT,PERRAL Supple Neck,No JVD, No cervical lymphadenopathy appriciated.  Symmetrical Chest wall movement, Good air movement bilaterally, Mod wheezing RRR,No Gallops,Rubs or new Murmurs, No Parasternal Heave +ve B.Sounds, Abd Soft, No tenderness, No organomegaly appriciated, No rebound - guarding or rigidity. No Cyanosis, Clubbing or edema, No new Rash or bruise      Data Review:    CBC  Recent Labs Lab 12/18/15 0003 12/18/15 0713 12/19/15 0323  WBC 14.5* 14.8* 17.9*  HGB 14.5 14.1 13.4  HCT 46.5* 45.6 43.7  PLT 259 254 271  MCV 98.1 98.1 97.8  MCH 30.6 30.3 30.0  MCHC 31.2 30.9 30.7  RDW 14.9 15.1 14.8  LYMPHSABS 2.5  --   --   MONOABS 1.0  --   --  EOSABS 0.1  --   --   BASOSABS 0.1  --   --     Chemistries   Recent Labs Lab 12/18/15 0003 12/18/15 0713 12/19/15 0323  NA 137 136 139  K 3.5 3.2* 4.3  CL 93* 89* 96*  CO2 34* 33* 37*  GLUCOSE 111* 189* 150*  BUN '15 15 14  '$ CREATININE 0.86 0.87 0.64  CALCIUM 8.5* 8.7* 9.0  MG  --   --  2.1  AST 23  --   --   ALT 21  --   --   ALKPHOS 73  --   --   BILITOT 0.5  --   --    ------------------------------------------------------------------------------------------------------------------ No results for input(s): CHOL, HDL, LDLCALC, TRIG, CHOLHDL, LDLDIRECT in the last 72 hours.  Lab Results  Component Value Date   HGBA1C 6.2* 07/25/2014    ------------------------------------------------------------------------------------------------------------------ No results for input(s): TSH, T4TOTAL, T3FREE, THYROIDAB in the last 72 hours.  Invalid input(s): FREET3 ------------------------------------------------------------------------------------------------------------------ No results for input(s): VITAMINB12, FOLATE, FERRITIN, TIBC, IRON, RETICCTPCT in the last 72 hours.  Coagulation profile No results for input(s): INR, PROTIME in the last 168 hours.  No results for input(s): DDIMER in the last 72 hours.  Cardiac Enzymes No results for input(s): CKMB, TROPONINI, MYOGLOBIN in the last 168 hours.  Invalid input(s): CK ------------------------------------------------------------------------------------------------------------------    Component Value Date/Time   BNP 166.0* 12/18/2015 0003    Micro Results Recent Results (from the past 240 hour(s))  MRSA PCR Screening     Status: None   Collection Time: 12/18/15  3:24 AM  Result Value Ref Range Status   MRSA by PCR NEGATIVE NEGATIVE Final    Comment:        The GeneXpert MRSA Assay (FDA approved for NASAL specimens only), is one component of a comprehensive MRSA colonization surveillance program. It is not intended to diagnose MRSA infection nor to guide or monitor treatment for MRSA infections.     Radiology Reports Dg Chest Port 1 View  12/19/2015  CLINICAL DATA:  Respiratory failure.  COPD. EXAM: PORTABLE CHEST 1 VIEW COMPARISON:  12/18/2015 FINDINGS: Mild cardiomegaly and vascular congestion. Left basilar airspace opacity. Right lung is clear. No effusions or acute bony abnormality. IMPRESSION: Cardiomegaly, vascular congestion. Left base atelectasis or infiltrate. Electronically Signed   By: Rolm Baptise M.D.   On: 12/19/2015 07:10   Dg Chest Port 1 View  12/18/2015  CLINICAL DATA:  Decreased oxygen saturations EXAM: PORTABLE CHEST 1 VIEW COMPARISON:   12/18/2015 FINDINGS: Normal heart size. There is diminished aeration to the left lung base. Opacity noted within the right infrahilar region appears improved from previous exam. Aortic atherosclerosis noted. IMPRESSION: 1. Left base opacity which may represent pneumonia or atelectasis. Electronically Signed   By: Kerby Moors M.D.   On: 12/18/2015 07:25   Dg Chest Port 1 View  12/18/2015  CLINICAL DATA:  Shortness of breath EXAM: PORTABLE CHEST 1 VIEW COMPARISON:  07/28/2014 FINDINGS: Bronchitic markings and hyperinflation. Streaky right infrahilar opacity. No edema, effusion, or pneumothorax. No suspected cardiomegaly when accounting for mediastinal fat pad. IMPRESSION: 1. Right infrahilar atelectasis or pneumonia. 2. COPD. Electronically Signed   By: Monte Fantasia M.D.   On: 12/18/2015 00:24    Time Spent in minutes  30   Serenidy Waltz K M.D on 12/19/2015 at 8:04 AM  Between 7am to 7pm - Pager - [phone]  After 7pm go to [email or web address] - password [facility]  [facility] -  Office  [phone]

## 2015-12-19 NOTE — Progress Notes (Signed)
RT demonstrated and taught patient the use and proper technique of using flutter valve. Patient understood and properly demonstrated technique.

## 2015-12-19 NOTE — Progress Notes (Signed)
Pt was lying in bed and awake/alert when [facility] arrived. The nature of our visit was to discuss advance directives. However, she was not ready to discuss or complete one. She started crying and said she was afraid of dying. We discussed question re AD is standard upon admission. Pt also began crying as she talked about her family, in particular a son who is now living as a woman. She said after high school he said he was gay; he later said he was bi-sexual and has now transitioned to living as a woman. CH observed her grieving her son and expressed her confusion about why he is living as a woman and living with a woman who is living as a man. She said why couldn't they just be who they are and be together. She also became tearful as she talked about her parents who are deceased. Chaplain provided grief and emotional support as pt expressed layers of pain and concerns. She spoke positively about her marriage and hopes to be home to celebrate their 79 yr anniversary next Sunday.  [place] encouraged pt to request a [place] for future support. Please page if additional support is needed.  Chaplain Ernest Haber, M.Div.   12/19/15 1600  Clinical Encounter Type  Visited With Patient

## 2015-12-19 NOTE — Consult Note (Signed)
PULMONARY / CRITICAL CARE MEDICINE   Name: Deborah [name] MRN: [id number] DOB: [birth year]    ADMISSION DATE:  12/17/2015 CONSULTATION DATE:  12/18/2015  REFERRING MD:  Dr. Candiss Norse  CHIEF COMPLAINT:  Short of breath  SUBJECTIVE:  Feels better.  Still has wheeze and cough, but getting air in better.  VITAL SIGNS: BP 139/78 mmHg  Pulse 77  Temp(Src) 98.4 F (36.9 C) (Oral)  Resp 18  Ht '5\' 4"'$  (1.626 m)  Wt 217 lb 2.5 oz (98.5 kg)  BMI 37.26 kg/m2  SpO2 92%  VENTILATOR SETTINGS: Vent Mode:  [-] BIPAP FiO2 (%):  [50 %] 50 % Set Rate:  [14 bmp] 14 bmp PEEP:  [10 cmH20] 10 cmH20  INTAKE / OUTPUT: I/O last 3 completed shifts: In: 1359.2 [I.V.:979.2; Other:30; IV Piggyback:350] Out: 350 [Urine:350]  PHYSICAL EXAMINATION: General:  Alert, sitting in chair Neuro:  Follows commands, normal strength HEENT: raspy voice, no stridor Cardiovascular:  Regular, no murmur Lungs: better air movement, b/l wheeze Abdomen:  Soft, non tender Musculoskeletal:  No edema Skin:  No rashes  LABS:  BMET  Recent Labs Lab 12/18/15 0003 12/18/15 0713 12/19/15 0323  NA 137 136 139  K 3.5 3.2* 4.3  CL 93* 89* 96*  CO2 34* 33* 37*  BUN '15 15 14  '$ CREATININE 0.86 0.87 0.64  GLUCOSE 111* 189* 150*    Electrolytes  Recent Labs Lab 12/18/15 0003 12/18/15 0713 12/19/15 0323  CALCIUM 8.5* 8.7* 9.0  MG  --   --  2.1    CBC  Recent Labs Lab 12/18/15 0003 12/18/15 0713 12/19/15 0323  WBC 14.5* 14.8* 17.9*  HGB 14.5 14.1 13.4  HCT 46.5* 45.6 43.7  PLT 259 254 271    Coag's No results for input(s): APTT, INR in the last 168 hours.  Sepsis Markers  Recent Labs Lab 12/18/15 0246 12/18/15 0713  LATICACIDVEN 2.63* 2.1*    ABG  Recent Labs Lab 12/18/15 0037 12/18/15 0751 12/18/15 1040  PHART 7.289* 7.253* 7.279*  PCO2ART 74.8* 85.4* 77.8*  PO2ART 68.4* 77.0* 75.5*    Liver Enzymes  Recent Labs Lab 12/18/15 0003  AST 23  ALT 21  ALKPHOS 73  BILITOT 0.5   ALBUMIN 3.3*    Cardiac Enzymes No results for input(s): TROPONINI, PROBNP in the last 168 hours.  Glucose No results for input(s): GLUCAP in the last 168 hours.  Imaging Dg Chest Port 1 View  12/19/2015  CLINICAL DATA:  Respiratory failure.  COPD. EXAM: PORTABLE CHEST 1 VIEW COMPARISON:  12/18/2015 FINDINGS: Mild cardiomegaly and vascular congestion. Left basilar airspace opacity. Right lung is clear. No effusions or acute bony abnormality. IMPRESSION: Cardiomegaly, vascular congestion. Left base atelectasis or infiltrate. Electronically Signed   By: Rolm Baptise M.D.   On: 12/19/2015 07:10     STUDIES:  06/05/13 PFT >> FEV1 1.58 (60%), FEV1% 81, TLC 4.13 (84%), DLCO 60% 07/26/14 Echo >> EF 50 to 40%, grade 1 diastolic CHF, severe RV dilation, mod TR, PAS 38 mmHg 09/07/14 PSG >> AHI 6.8  CULTURES: 6/03 Blood >>   ANTIBIOTICS: 6/02 Zithromax >>  6/02 Rocephin >>   SIGNIFICANT EVENTS: 6/02 Admit, BiPAP  LINES/TUBES:  DISCUSSION: 60 yo female smoker with acute on chronic hypoxic/hypercapnic respiratory failure from AECOPD, sleep disordered breathing.  ASSESSMENT / PLAN:  Acute on chronic hypoxic/hypercapnic respiratory failure. - oxygen to keep SpO2 88 to 95% - BiPAP qhs and prn during the day - limit ABGs >> used use mental status as gauge  for response to therapy  COPD exacerbation. - continue solumedrol - scheduled BDs - day 3 of antibiotics  Tobacco abuse. - discussed importance of smoking cessation  Sleep disordered breathing. - BiPAP qhs  Rhinitis. - nasal irrigation, flonase, singulair, claritin   Chesley Mires, MD [place] 12/19/2015, 12:15 PM Pager:  [phone] After 3pm call: [phone]

## 2015-12-20 LAB — BLOOD GAS, ARTERIAL
Acid-Base Excess: 6 mmol/L — ABNORMAL HIGH (ref 0.0–2.0)
Acid-Base Excess: 5.8 mmol/L — ABNORMAL HIGH (ref 0.0–2.0)
Bicarbonate: 36.4 mEq/L — ABNORMAL HIGH (ref 20.0–24.0)
Bicarbonate: 35.3 mEq/L — ABNORMAL HIGH (ref 20.0–24.0)
Drawn by: 441261
Drawn by: 441261
FIO2: 0.6
FIO2: 0.5
Mode: POSITIVE
Mode: POSITIVE
O2 Saturation: 92.7 %
O2 Saturation: 93 %
PEEP: 6 cmH2O
PEEP: 10 cmH2O
Patient temperature: 98.6
Patient temperature: 98.6
Pressure control: 18 cmH2O
Pressure control: 18 cmH2O
RATE: 16 resp/min
RATE: 14 resp/min
TCO2: 33.2 mmol/L (ref 0–100)
TCO2: 32 mmol/L (ref 0–100)
pCO2 arterial: 85.4 mmHg (ref 35.0–45.0)
pCO2 arterial: 77.8 mmHg (ref 35.0–45.0)
pH, Arterial: 7.253 — ABNORMAL LOW (ref 7.350–7.450)
pH, Arterial: 7.279 — ABNORMAL LOW (ref 7.350–7.450)
pO2, Arterial: 77 mmHg — ABNORMAL LOW (ref 80.0–100.0)
pO2, Arterial: 75.5 mmHg — ABNORMAL LOW (ref 80.0–100.0)

## 2015-12-20 MED ORDER — METHYLPREDNISOLONE SODIUM SUCC 125 MG IJ SOLR
80.0000 mg | Freq: Two times a day (BID) | INTRAMUSCULAR | Status: DC
  Administered 2015-12-20 – 2015-12-21 (×2): 80 mg via INTRAVENOUS
  Filled 2015-12-20 (×3): qty 2

## 2015-12-20 NOTE — Progress Notes (Signed)
PULMONARY / CRITICAL CARE MEDICINE   Name: Deborah [name] MRN: [id number] DOB: 1956/02/21    ADMISSION DATE:  12/17/2015 CONSULTATION DATE:  12/18/2015  REFERRING MD:  Dr. Candiss Norse  CHIEF COMPLAINT:  Short of breath  SUBJECTIVE:  Not as much cough or congestion.  Was able to walk in hall.  VITAL SIGNS: BP 111/59 mmHg  Pulse 78  Temp(Src) 97.7 F (36.5 C) (Oral)  Resp 23  Ht '5\' 4"'$  (1.626 m)  Wt 217 lb 2.5 oz (98.5 kg)  BMI 37.26 kg/m2  SpO2 93%  INTAKE / OUTPUT: I/O last 3 completed shifts: In: 1450 [I.V.:1350; IV Piggyback:100] Out: 1725 [Urine:1725]  PHYSICAL EXAMINATION: General:  Alert, sitting in up in bed Neuro:  Follows commands, normal strength HEENT: raspy voice, no stridor Cardiovascular:  Regular, no murmur Lungs: scattered crackles, not as much wheeze Abdomen:  Soft, non tender Musculoskeletal:  No edema Skin:  No rashes  LABS:  BMET  Recent Labs Lab 12/18/15 0003 12/18/15 0713 12/19/15 0323  NA 137 136 139  K 3.5 3.2* 4.3  CL 93* 89* 96*  CO2 34* 33* 37*  BUN '15 15 14  '$ CREATININE 0.86 0.87 0.64  GLUCOSE 111* 189* 150*    Electrolytes  Recent Labs Lab 12/18/15 0003 12/18/15 0713 12/19/15 0323  CALCIUM 8.5* 8.7* 9.0  MG  --   --  2.1    CBC  Recent Labs Lab 12/18/15 0003 12/18/15 0713 12/19/15 0323  WBC 14.5* 14.8* 17.9*  HGB 14.5 14.1 13.4  HCT 46.5* 45.6 43.7  PLT 259 254 271    Coag's No results for input(s): APTT, INR in the last 168 hours.  Sepsis Markers  Recent Labs Lab 12/18/15 0246 12/18/15 0713  LATICACIDVEN 2.63* 2.1*    ABG  Recent Labs Lab 12/18/15 0037 12/18/15 0751 12/18/15 1040  PHART 7.289* 7.253* 7.279*  PCO2ART 74.8* 85.4* 77.8*  PO2ART 68.4* 77.0* 75.5*    Liver Enzymes  Recent Labs Lab 12/18/15 0003  AST 23  ALT 21  ALKPHOS 73  BILITOT 0.5  ALBUMIN 3.3*    Cardiac Enzymes No results for input(s): TROPONINI, PROBNP in the last 168 hours.  Glucose No results for  input(s): GLUCAP in the last 168 hours.  Imaging No results found.   STUDIES:  06/05/13 PFT >> FEV1 1.58 (60%), FEV1% 81, TLC 4.13 (84%), DLCO 60% 07/26/14 Echo >> EF 50 to 57%, grade 1 diastolic CHF, severe RV dilation, mod TR, PAS 38 mmHg 09/07/14 PSG >> AHI 6.8  CULTURES: 6/03 Blood >>   ANTIBIOTICS: 6/02 Zithromax >>  6/02 Rocephin >>   SIGNIFICANT EVENTS: 6/02 Admit, BiPAP 6/05 to floor bed  LINES/TUBES:  DISCUSSION: 60 yo female smoker with acute on chronic hypoxic/hypercapnic respiratory failure from AECOPD, sleep disordered breathing.  ASSESSMENT / PLAN:  Acute on chronic hypoxic/hypercapnic respiratory failure. - oxygen to keep SpO2 88 to 95% - BiPAP qhs and prn during the day - limit ABGs >> used use mental status as gauge for response to therapy  COPD exacerbation. - continue solumedrol >> might be able to transition to prednisone 6/06 - scheduled BDs - day 4 of antibiotics per primary team  Tobacco abuse. - discussed importance of smoking cessation  Sleep disordered breathing. - BiPAP qhs  Rhinitis. - nasal irrigation, flonase, singulair, claritin  Summary: Agree with plan to transfer out of SDU.  She is getting closer to discharge >> might be ready 6/06 or 6/07.   Chesley Mires, MD [facility] Pulmonary/Critical Care 12/20/2015,  9:32 AM Pager:  [phone] After 3pm call: [phone]

## 2015-12-20 NOTE — Care Management Note (Signed)
Case Management Note  Patient Details  Name: AKACIA BOLTZ MRN: [id number] Date of Birth: 1956-05-06  Subjective/Objective:PT recc HHPT, rw. [facility] chosen by patient-rep Santiago Glad aware of HHc orders,[facility] dme rep [place] aware of home rw order. Patient already has home 02 from [facility], has travel tank.                    Action/Plan:d/c home w/HHC/dme.   Expected Discharge Date:                  Expected Discharge Plan:  [place]  In-House Referral:  NA  Discharge planning Services  CM Consult  Post Acute Care Choice:  NA Choice offered to:  Patient  DME Arranged:  [place] rolling DME Agency:  [place] Arranged:  RN, PT, Nurse's Aide [place] Agency:  [facility]  Status of Service:  In process, will continue to follow  Medicare Important Message Given:    Date Medicare IM Given:    Medicare IM give by:    Date Additional Medicare IM Given:    Additional Medicare Important Message give by:     If discussed at [place] of Stay Meetings, dates discussed:    Additional Comments:  Dessa Phi, RN 12/20/2015, 2:12 PM

## 2015-12-20 NOTE — Progress Notes (Signed)
Received pt from ICU, VS obtained, telemetry box applied, oriented to unit, call light with in reach

## 2015-12-20 NOTE — Progress Notes (Signed)
Physical Therapy Treatment Patient Details Name: Deborah [name] MRN: [id number] DOB: August 08, 1955 Today's Date: 12/20/2015    History of Present Illness Deborah [name] is a 60 y.o. female with medical history significant of COPD patient is on 4L O2 at home at baseline. Patient presents to the ED 12/17/15  with c/o SOB, cough, congestion, weakness and falls. Cough is productive. Hypoxic requiring 5L o2 in ED, hypercapnic and put on BIPAP     Deborah [name]    Progressing with mobility. O2 sats 93% on 4L O2 at rest, 82% on 6L O2 during ambulation.   Follow Up Recommendations  Home health Deborah;Supervision/Assistance - 24 hour     Equipment Recommendations  Rolling walker with 5" wheels    Recommendations for Other Services       Precautions / Restrictions Precautions Precautions: Fall Precaution [name]: on oxygen, monitor sats and HR Restrictions Weight Bearing Restrictions: No    Mobility  Bed Mobility Overal bed mobility: Modified Independent                Transfers Overall transfer level: Needs assistance   Transfers: Sit to/from Stand Sit to Stand: Min guard         General transfer comment: close guard for safety  Ambulation/Gait Ambulation/Gait assistance: Min guard Ambulation Distance (Feet): 100 Feet Assistive device:  (hallway handrail) Gait Pattern/deviations: Step-through pattern;Decreased stride length     General Gait Details: slow gait speed. Deborah used hallway handrail for 50% of distance. Ambulated on 6 L O2-sats 82%. Dyspnea 2/4   Stairs            Wheelchair Mobility    Modified Rankin (Stroke Patients Only)       Balance                                    Cognition Arousal/Alertness: Awake/alert Behavior During Therapy: WFL for tasks assessed/performed Overall Cognitive Status: Within Functional Limits for tasks assessed                      Exercises      General [name]        Pertinent  Vitals/Pain Pain Assessment: No/denies pain    Home Living                      Prior Function            Deborah Goals (current goals can now be found in the care plan section) Progress towards Deborah goals: Progressing toward goals    Frequency  Min 3X/week    Deborah Plan Current plan remains appropriate    Co-evaluation             End of Session Equipment Utilized During Treatment: Gait belt;Oxygen Activity Tolerance: Patient tolerated treatment well Patient left: in chair;with call bell/phone within reach;with chair alarm set     Time: 6761-9509 Deborah Time Calculation (min) (ACUTE ONLY): 13 min  Charges:  $Gait Training: 8-22 mins                    G Codes:      Deborah [name], MPT Pager: [phone]

## 2015-12-20 NOTE — Progress Notes (Signed)
PROGRESS NOTE                                                                                                                                                                                                             Patient Demographics:    Deborah [name], is a 60 y.o. female, DOB - [birth year], WLN:[id number]  Admit date - 12/17/2015   Admitting Physician Etta Quill, DO  Outpatient Primary MD for the patient is [name], MD  LOS - 2  Chief Complaint  Patient presents with  . Fall       Brief Narrative    Deborah [name] is a 60 y.o. female with medical history significant of COPD patient is on 4L O2 at home at baseline. Patient presents to the ED with c/o SOB, cough, congestion. Cough is productive. Patient had multiple falls PTA recently due to SOB. Last hospital stay for COPD exacerbation was June of last year.  ED Course: Hypoxic requiring 5L o2 in ED, hypercapnic and put on BIPAP for Ph 7.29 and PCO2 of 74.    Subjective:    Deborah [name] today has, No headache, No chest pain, No abdominal pain - No Nausea, No new weakness tingling or numbness, No Cough - Improved SOB.    Assessment  & Plan :     1. Acute on chronic hypoxic and hypercapnic respiratory failure due to combination of COPD exacerbation and community-acquired pneumonia with Encephaloapthy. Proved after BiPAP and IV steroids along with empiric antibiotics, will transfer to the floor, start tapering IV steroids, cultures so far negative, continue supportive care with daytime oxygen and nebulizer treatment and nighttime BiPAP.  2. COPD with pulmonary hypertension. In excess ablation, advanced at baseline, patient uses 4-5 L nasal cannula oxygen. Plan as above.  3. Chronic grade 1 diastolic CHF. EF 55-60%. Currently compensated.  4. Obstructive sleep apnea. For now on BiPAP. Non complaint at home. Counseled.  5. Morbid obesity. Follow  with PCP for weight loss.  6. Smoking - counseled to quit.     Code Status :  Full  Family Communication  :  Husband bedside 12-18-15  Disposition Plan  : Home tomorrow, home health PT and equipment ordered   Consults  :  PCCM  Procedures  :   DVT Prophylaxis  :  Lovenox   Lab  Results  Component Value Date   PLT 271 12/19/2015    Inpatient Medications  Scheduled Meds: . antiseptic oral rinse  7 mL Mouth Rinse q12n4p  . aspirin  81 mg Oral Daily  . azithromycin  500 mg Oral QHS  . cefTRIAXone (ROCEPHIN)  IV  1 g Intravenous QHS  . chlorhexidine  15 mL Mouth Rinse BID  . diltiazem  120 mg Oral Daily  . enoxaparin (LOVENOX) injection  50 mg Subcutaneous Daily  . fluticasone  2 spray Each Nare Daily  . ipratropium-albuterol  3 mL Nebulization TID  . loratadine  10 mg Oral Daily  . methylPREDNISolone (SOLU-MEDROL) injection  80 mg Intravenous Q12H  . montelukast  10 mg Oral QHS  . pyridOXINE  100 mg Oral Daily  . sodium chloride  2 spray Each Nare BID  . vitamin B-12  1,000 mcg Oral Daily   Continuous Infusions:   PRN Meds:.  Antibiotics  :    Anti-infectives    Start     Dose/Rate Route Frequency Ordered Stop   12/18/15 2200  cefTRIAXone (ROCEPHIN) 1 g in dextrose 5 % 50 mL IVPB     1 g 100 mL/hr over 30 Minutes Intravenous Daily at bedtime 12/18/15 0136 12/25/15 2159   12/18/15 2200  azithromycin (ZITHROMAX) tablet 500 mg     500 mg Oral Daily at bedtime 12/18/15 0136 12/25/15 2159   12/18/15 0130  cefTRIAXone (ROCEPHIN) 1 g in dextrose 5 % 50 mL IVPB     1 g 100 mL/hr over 30 Minutes Intravenous  Once 12/18/15 0125 12/18/15 0333   12/18/15 0130  azithromycin (ZITHROMAX) 500 mg in dextrose 5 % 250 mL IVPB     500 mg 250 mL/hr over 60 Minutes Intravenous  Once 12/18/15 0125 12/18/15 0404         Objective:   Filed Vitals:   12/20/15 0400 12/20/15 0500 12/20/15 0600 12/20/15 0755  BP: 1'01/52 90/54 97/56 '$   Pulse: 50 47 43   Temp: 97.7 F (36.5 C)    97.7 F (36.5 C)  TempSrc: Axillary   Oral  Resp: '18 17 16   '$ Height:      Weight:      SpO2: 89% 92% 92% 89%    Wt Readings from Last 3 Encounters:  12/18/15 98.5 kg (217 lb 2.5 oz)  11/29/15 101.039 kg (222 lb 12 oz)  06/01/15 102.967 kg (227 lb)     Intake/Output Summary (Last 24 hours) at 12/20/15 0830 Last data filed at 12/20/15 0818  Gross per 24 hour  Intake    700 ml  Output   2225 ml  [name]  -1525 ml     Physical Exam  Awake Alert,  No new F.N deficits, Normal affect, Wearing BiPAP Deborah [name],PERRAL Supple Neck,No JVD, No cervical lymphadenopathy appriciated.  Symmetrical Chest wall movement, Good air movement bilaterally, Mod wheezing RRR,No Gallops,Rubs or new Murmurs, No Parasternal Heave +ve B.Sounds, Abd Soft, No tenderness, No organomegaly appriciated, No rebound - guarding or rigidity. No Cyanosis, Clubbing or edema, No new Rash or bruise      Data Review:    CBC  Recent Labs Lab 12/18/15 0003 12/18/15 0713 12/19/15 0323  WBC 14.5* 14.8* 17.9*  HGB 14.5 14.1 13.4  HCT 46.5* 45.6 43.7  PLT 259 254 271  MCV 98.1 98.1 97.8  MCH 30.6 30.3 30.0  MCHC 31.2 30.9 30.7  RDW 14.9 15.1 14.8  LYMPHSABS 2.5  --   --   MONOABS  1.0  --   --   EOSABS 0.1  --   --   BASOSABS 0.1  --   --     Chemistries   Recent Labs Lab 12/18/15 0003 12/18/15 0713 12/19/15 0323  NA 137 136 139  K 3.5 3.2* 4.3  CL 93* 89* 96*  CO2 34* 33* 37*  GLUCOSE 111* 189* 150*  BUN '15 15 14  '$ CREATININE 0.86 0.87 0.64  CALCIUM 8.5* 8.7* 9.0  MG  --   --  2.1  AST 23  --   --   ALT 21  --   --   ALKPHOS 73  --   --   BILITOT 0.5  --   --    ------------------------------------------------------------------------------------------------------------------ No results for input(s): CHOL, HDL, LDLCALC, TRIG, CHOLHDL, LDLDIRECT in the last 72 hours.  Lab Results  Component Value Date   HGBA1C 6.2* 07/25/2014    ------------------------------------------------------------------------------------------------------------------ No results for input(s): TSH, T4TOTAL, T3FREE, THYROIDAB in the last 72 hours.  Invalid input(s): FREET3 ------------------------------------------------------------------------------------------------------------------ No results for input(s): VITAMINB12, FOLATE, FERRITIN, TIBC, IRON, RETICCTPCT in the last 72 hours.  Coagulation profile No results for input(s): INR, PROTIME in the last 168 hours.  No results for input(s): DDIMER in the last 72 hours.  Cardiac Enzymes No results for input(s): CKMB, TROPONINI, MYOGLOBIN in the last 168 hours.  Invalid input(s): CK ------------------------------------------------------------------------------------------------------------------    Component Value Date/Time   BNP 166.0* 12/18/2015 0003    Micro Results Recent Results (from the past 240 hour(s))  Blood culture (routine x 2)     Status: None (Preliminary result)   Collection Time: 12/18/15  2:24 AM  Result Value Ref Range Status   Specimen Description BLOOD RIGHT ANTECUBITAL  Final   Special Requests BOTTLES DRAWN AEROBIC AND ANAEROBIC 5CC  Final   Culture   Final    NO GROWTH 1 DAY Performed at [facility]    Report Status PENDING  Incomplete  Blood culture (routine x 2)     Status: None (Preliminary result)   Collection Time: 12/18/15  2:24 AM  Result Value Ref Range Status   Specimen Description BLOOD RIGHT HAND  Final   Special Requests IN PEDIATRIC BOTTLE 4CC  Final   Culture   Final    NO GROWTH 1 DAY Performed at [facility]    Report Status PENDING  Incomplete  MRSA PCR Screening     Status: None   Collection Time: 12/18/15  3:24 AM  Result Value Ref Range Status   MRSA by PCR NEGATIVE NEGATIVE Final    Comment:        The GeneXpert MRSA Assay (FDA approved for NASAL specimens only), is one component of a comprehensive MRSA  colonization surveillance program. It is not intended to diagnose MRSA infection nor to guide or monitor treatment for MRSA infections.     Radiology Reports Dg Chest Port 1 View  12/19/2015  CLINICAL DATA:  Respiratory failure.  COPD. EXAM: PORTABLE CHEST 1 VIEW COMPARISON:  12/18/2015 FINDINGS: Mild cardiomegaly and vascular congestion. Left basilar airspace opacity. Right lung is clear. No effusions or acute bony abnormality. IMPRESSION: Cardiomegaly, vascular congestion. Left base atelectasis or infiltrate. Electronically Signed   By: Rolm Baptise M.D.   On: 12/19/2015 07:10   Dg Chest Port 1 View  12/18/2015  CLINICAL DATA:  Decreased oxygen saturations EXAM: PORTABLE CHEST 1 VIEW COMPARISON:  12/18/2015 FINDINGS: Normal heart size. There is diminished aeration to the left lung base. Opacity  noted within the right infrahilar region appears improved from previous exam. Aortic atherosclerosis noted. IMPRESSION: 1. Left base opacity which may represent pneumonia or atelectasis. Electronically Signed   By: Kerby Moors M.D.   On: 12/18/2015 07:25   Dg Chest Port 1 View  12/18/2015  CLINICAL DATA:  Shortness of breath EXAM: PORTABLE CHEST 1 VIEW COMPARISON:  07/28/2014 FINDINGS: Bronchitic markings and hyperinflation. Streaky right infrahilar opacity. No edema, effusion, or pneumothorax. No suspected cardiomegaly when accounting for mediastinal fat pad. IMPRESSION: 1. Right infrahilar atelectasis or pneumonia. 2. COPD. Electronically Signed   By: Monte Fantasia M.D.   On: 12/18/2015 00:24    Time Spent in minutes  30   Fawn Desrocher K M.D on 12/20/2015 at 8:30 AM  Between 7am to 7pm - Pager - [phone]  After 7pm go to [email or web address] - password [facility]  [facility] -  Office  [phone]

## 2015-12-20 NOTE — Care Management Note (Signed)
Case Management Note  Patient Details  Name: Deborah [name] MRN: [id number] Date of Birth: [birth year]  Subjective/Objective:            Copd with pna requiring bipap        Action/Plan:Date:  December 20, 2015 Chart reviewed for concurrent status and case management needs. Will continue to follow the patient for changes and needs: Expected discharge date: [id number] Velva Harman, BSN, [place], [place]   Expected Discharge Date:                  Expected Discharge Plan:  Home/Self Care  In-House Referral:  NA  Discharge planning Services  CM Consult  Post Acute Care Choice:  NA Choice offered to:  NA  DME Arranged:  N/A DME Agency:  NA  HH Arranged:  NA HH Agency:  NA  Status of Service:  In process, will continue to follow  Medicare Important Message Given:    Date Medicare IM Given:    Medicare IM give by:    Date Additional Medicare IM Given:    Additional Medicare Important Message give by:     If discussed at [place] of Stay Meetings, dates discussed:    Additional Comments:  Leeroy Cha, RN 12/20/2015, 10:44 AM

## 2015-12-21 ENCOUNTER — Telehealth: Payer: Self-pay | Admitting: Pulmonary Disease

## 2015-12-21 LAB — CBC
HCT: 44.4 % (ref 36.0–46.0)
Hemoglobin: 14 g/dL (ref 12.0–15.0)
MCH: 30.1 pg (ref 26.0–34.0)
MCHC: 31.5 g/dL (ref 30.0–36.0)
MCV: 95.5 fL (ref 78.0–100.0)
Platelets: 279 10*3/uL (ref 150–400)
RBC: 4.65 MIL/uL (ref 3.87–5.11)
RDW: 15 % (ref 11.5–15.5)
WBC: 13.3 10*3/uL — ABNORMAL HIGH (ref 4.0–10.5)

## 2015-12-21 MED ORDER — AZITHROMYCIN 500 MG PO TABS: 500.0000 mg | ORAL_TABLET | Freq: Every day | ORAL | Status: DC

## 2015-12-21 MED ORDER — CEFPODOXIME PROXETIL 200 MG PO TABS: 200.0000 mg | ORAL_TABLET | Freq: Two times a day (BID) | ORAL | Status: DC

## 2015-12-21 MED ORDER — PREDNISONE 5 MG PO TABS: ORAL_TABLET | ORAL | Status: DC

## 2015-12-21 NOTE — Progress Notes (Signed)
PULMONARY / CRITICAL CARE MEDICINE   Name: Deborah [name] MRN: [id number] DOB: June 17, 1956    ADMISSION DATE:  12/17/2015 CONSULTATION DATE:  12/18/2015  REFERRING MD:  Dr. Candiss Norse  CHIEF COMPLAINT:  Short of breath  SUBJECTIVE:  Wheezing improved.  Not as much cough.  Walked in hall w/o difficulty.  VITAL SIGNS: BP 118/68 mmHg  Pulse 55  Temp(Src) 97.8 F (36.6 C) (Oral)  Resp 16  Ht '5\' 4"'$  (1.626 m)  Wt 217 lb 2.5 oz (98.5 kg)  BMI 37.26 kg/m2  SpO2 92%  INTAKE / OUTPUT: I/O last 3 completed shifts: In: 998 [P.O.:120; I.V.:650; IV Piggyback:100] Out: 850 [Urine:850]  PHYSICAL EXAMINATION: General:  Alert, sitting in up in bed Neuro:  Follows commands, normal strength HEENT: raspy voice, no stridor Cardiovascular:  Regular, no murmur Lungs: scattered rhonchi Abdomen:  Soft, non tender Musculoskeletal:  No edema Skin:  No rashes  LABS:  BMET  Recent Labs Lab 12/18/15 0003 12/18/15 0713 12/19/15 0323  NA 137 136 139  K 3.5 3.2* 4.3  CL 93* 89* 96*  CO2 34* 33* 37*  BUN '15 15 14  '$ CREATININE 0.86 0.87 0.64  GLUCOSE 111* 189* 150*    Electrolytes  Recent Labs Lab 12/18/15 0003 12/18/15 0713 12/19/15 0323  CALCIUM 8.5* 8.7* 9.0  MG  --   --  2.1    CBC  Recent Labs Lab 12/18/15 0713 12/19/15 0323 12/21/15 0534  WBC 14.8* 17.9* 13.3*  HGB 14.1 13.4 14.0  HCT 45.6 43.7 44.4  PLT 254 271 279    Coag's No results for input(s): APTT, INR in the last 168 hours.  Sepsis Markers  Recent Labs Lab 12/18/15 0246 12/18/15 0713  LATICACIDVEN 2.63* 2.1*    ABG  Recent Labs Lab 12/18/15 0037 12/18/15 0751 12/18/15 1040  PHART 7.289* 7.253* 7.279*  PCO2ART 74.8* 85.4* 77.8*  PO2ART 68.4* 77.0* 75.5*    Liver Enzymes  Recent Labs Lab 12/18/15 0003  AST 23  ALT 21  ALKPHOS 73  BILITOT 0.5  ALBUMIN 3.3*    Cardiac Enzymes No results for input(s): TROPONINI, PROBNP in the last 168 hours.  Glucose No results for input(s):  GLUCAP in the last 168 hours.  Imaging No results found.   STUDIES:  06/05/13 PFT >> FEV1 1.58 (60%), FEV1% 81, TLC 4.13 (84%), DLCO 60% 07/26/14 Echo >> EF 50 to 33%, grade 1 diastolic CHF, severe RV dilation, mod TR, PAS 38 mmHg 09/07/14 PSG >> AHI 6.8  CULTURES: 6/03 Blood >>   ANTIBIOTICS: 6/02 Zithromax >>  6/02 Rocephin >>   SIGNIFICANT EVENTS: 6/02 Admit, BiPAP 6/05 to floor bed  LINES/TUBES:  DISCUSSION: 60 yo female smoker with acute on chronic hypoxic/hypercapnic respiratory failure from AECOPD, sleep disordered breathing.  ASSESSMENT / PLAN:  Acute on chronic hypoxic/hypercapnic respiratory failure. - oxygen to keep SpO2 88 to 95% - BiPAP qhs  COPD exacerbation. - can transition to prednisone 6/06 and wean off as tolerated over next 1 week - scheduled BDs with duoneb >> re-assess regimen as outpt - day 5 of antibiotics per primary team >> can transition to po Abx  Tobacco abuse. - discussed importance of smoking cessation  Sleep disordered breathing. - BiPAP qhs  Rhinitis. - nasal irrigation, flonase, singulair, claritin  Summary: She is ready for d/c home from pulmonary standpoint.  I have scheduled her for follow up in pulmonary office on 12/27/15 at 11 am with Eric Form.  She will then follow up with Dr. Elsworth Soho.  D/w Dr. Candiss Norse.  Chesley Mires, MD [facility]/Critical [facility] 12/21/2015, 9:34 AM Pager:  [phone] After 3pm call: [phone]

## 2015-12-21 NOTE — Telephone Encounter (Signed)
Phone note entered in error 

## 2015-12-21 NOTE — Care Management Note (Deleted)
Case Management Note  Patient Details  Name: RAYGAN SKARDA MRN: [id number] Date of Birth: [birth year]  Subjective/Objective: [facility] rep Santiago Glad aware of d/c & HHC orders. [facility] dme rep Jermaine aware of home 02 order-will deliver to rm prior d/c. Nsg notified.Patient has home 02-[facility], & travel tank. [facility] will follow post d/c.                   Action/Plan:d/c home w/HHC/dme.   Expected Discharge Date:                  Expected Discharge Plan:  [place]  In-House Referral:  NA  Discharge planning Services  CM Consult  Post Acute Care Choice:  NA Choice offered to:  Patient  DME Arranged:  Walker rolling DME Agency:  [place] Arranged:  RN, PT, Nurse's Aide, Patient Refused [facility] Agency:  [place]  Status of Service:  Completed, signed off  Medicare Important Message Given:    Date Medicare IM Given:    Medicare IM give by:    Date Additional Medicare IM Given:    Additional Medicare Important Message give by:     If discussed at [place] of Stay Meetings, dates discussed:    Additional Comments:  Dessa Phi, RN 12/21/2015, 11:56 AM

## 2015-12-21 NOTE — Care Management Note (Signed)
Case Management Note  Patient Details  Name: Deborah DUDZIAK MRN: [id number] Date of Birth: 27-Apr-1956  Subjective/Objective: Patient declines [place] since has a high co pay. [facility] rep Santiago Glad notified.[facility] dme rep Jermaine aware of rw order & d/c.                    Action/Plan:d/c home.   Expected Discharge Date:                  Expected Discharge Plan:  [place]  In-House Referral:  NA  Discharge planning Services  CM Consult  Post Acute Care Choice:  NA Choice offered to:  Patient  DME Arranged:  [place] rolling DME Agency:  [place] Arranged:  RN, PT, Nurse's Aide, Patient Refused [facility] Agency:  [place]  Status of Service:  Completed, signed off  Medicare Important Message Given:    Date Medicare IM Given:    Medicare IM give by:    Date Additional Medicare IM Given:    Additional Medicare Important Message give by:     If discussed at [place] of Stay Meetings, dates discussed:    Additional Comments:  Dessa Phi, RN 12/21/2015, 11:11 AM

## 2015-12-21 NOTE — Discharge Instructions (Signed)
Follow with Primary MD [name], MD in 7 days   Get CBC, CMP, 2 view Chest X ray checked  by Primary MD next visit.    Activity: As tolerated with Full fall precautions use walker/cane & assistance as needed   Disposition Home     Diet:   Heart Healthy   Check your Weight same time everyday, if you gain over 2 pounds, or you develop in leg swelling, experience more shortness of breath or chest pain, call your Primary MD immediately. Follow Cardiac Low Salt Diet and 1.5 lit/day fluid restriction.   On your next visit with your primary care physician please Get Medicines reviewed and adjusted.   Please request your Prim.MD to go over all Hospital Tests and Procedure/Radiological results at the follow up, please get all Hospital records sent to your Prim MD by signing hospital release before you go home.   If you experience worsening of your admission symptoms, develop shortness of breath, life threatening emergency, suicidal or homicidal thoughts you must seek medical attention immediately by calling 911 or calling your MD immediately  if symptoms less severe.  You Must read complete instructions/literature along with all the possible adverse reactions/side effects for all the Medicines you take and that have been prescribed to you. Take any new Medicines after you have completely understood and accpet all the possible adverse reactions/side effects.   Do not drive, operate heavy machinery, perform activities at heights, swimming or participation in water activities or provide baby sitting services if your were admitted for syncope or siezures until you have seen by Primary MD or a Neurologist and advised to do so again.  Do not drive when taking Pain medications.    Do not take more than prescribed Pain, Sleep and Anxiety Medications  Special Instructions: If you have smoked or chewed Tobacco  in the last 2 yrs please stop smoking, stop any regular Alcohol  and or any Recreational  drug use.  Wear Seat belts while driving.   Please note  You were cared for by a hospitalist during your hospital stay. If you have any questions about your discharge medications or the care you received while you were in the hospital after you are discharged, you can call the unit and asked to speak with the hospitalist on call if the hospitalist that took care of you is not available. Once you are discharged, your primary care physician will handle any further medical issues. Please note that NO REFILLS for any discharge medications will be authorized once you are discharged, as it is imperative that you return to your primary care physician (or establish a relationship with a primary care physician if you do not have one) for your aftercare needs so that they can reassess your need for medications and monitor your lab values.

## 2015-12-21 NOTE — Discharge Summary (Signed)
Deborah [name], is a 60 y.o. female  DOB 1955/09/18  MRN [id number].  Admission date:  12/17/2015  Admitting Physician  Etta Quill, DO  Discharge Date:  12/21/2015   Primary MD  [name], MD  Recommendations for primary care physician for things to follow:   Recheck CBC, CMP and a 2 view chest x-ray in a week  Admission Diagnosis  COPD exacerbation ([place]) [J44.1]   Discharge Diagnosis  COPD exacerbation ([place]) [J44.1]      Principal Problem:   CAP (community acquired pneumonia) Active Problems:   Chronic diastolic heart failure ([place])   Acute on chronic respiratory failure with hypoxia and hypercapnia ([facility])   COPD exacerbation ([place])      Past Medical History  Diagnosis Date  . Umbilical hernia   . Asthma   . Anxiety   . Depression   . Shortness of breath   . Pneumonia     hx of  . Arthritis   . Anemia     as teen  . PONV (postoperative nausea and vomiting)   . Family history of anesthesia complication     vomiting  . Histoplasmosis     left eye  . Hypertension   . Restrictive lung disease   . COPD, mild ([place])   . Heart failure ([place])     New onset 07/25/14  . Obesity (BMI 30-39.9)   . Tobacco abuse   . Hyperkalemia   . CHF (congestive heart failure) [facility])     Past Surgical History  Procedure Laterality Date  . Cesarean section  04/12/85  . Ventral hernia repair N/A 03/19/2013    Procedure:  OPEN VENTRAL HERNIA REPAIR WITH MESH AND APPLICATION OF WOUND VAC;  Surgeon: Madilyn Hook, DO;  Location: [facility];  Service: General;  Laterality: N/A;  . Insertion of mesh N/A 03/19/2013    Procedure: INSERTION OF MESH;  Surgeon: Madilyn Hook, DO;  Location: [facility];  Service: General;  Laterality: N/A;  . Application of wound vac  03/19/2013    Procedure: APPLICATION OF WOUND VAC;  Surgeon: Madilyn Hook,  DO;  Location: [facility];  Service: General;;       HPI  from the history and physical done on the day of admission:    Deborah [name] is a 60 y.o. female with medical history significant of COPD patient is on 4L O2 at home at baseline. Patient presents to the ED with c/o SOB, cough, congestion. Cough is productive. Patient had multiple falls PTA recently due to SOB. Last hospital stay for COPD exacerbation was June of last year.  ED Course: Hypoxic requiring 5L o2 in ED, hypercapnic and put on BIPAP for Ph 7.29 and PCO2 of 74.     Hospital Course:     1. Acute on chronic hypoxic and hypercapnic respiratory failure due to combination of COPD exacerbation and community-acquired pneumonia with Encephaloapthy. Considerably improved and now at her baseline after BiPAP and IV steroids along with empiric antibiotics, she has been seen by  pulmonary today and cleared for home discharge, she will be transitioned to oral steroid taper and oral antibiotics for 5 more days. Follow with PCP and pulmonary outpatient. Request PCP to check CBC, CMP and a 2 view chest x-ray in 7-10 days.  2. COPD with pulmonary hypertension. In excess ablation, advanced at baseline, patient uses 4-5 L nasal cannula oxygen. Plan as above.  3. Chronic grade 1 diastolic CHF. EF 55-60%. Currently compensated.  4. Obstructive sleep apnea. For now on BiPAP. Non complaint at home. Counseled.  5. Morbid obesity. Follow with PCP for weight loss.  6. Smoking - counseled to quit.    Follow UP  Follow-up Information    Follow up with [name].   Why:  rw   Contact information:   [address] [place] [address] [phone]       Follow up with [name].   Why:  HHRN/PT/aide.   Contact information:   [address] [place] [address] [phone]       Follow up with [name], NP On 12/27/2015.   Specialty:  Pulmonary Disease   Why:  Follow up  with lung doctors at 11 AM.   Contact information:   [address] [place]  [address] [phone]       Follow up with [name], MD. Schedule an appointment as soon as possible for a visit in 1 week.   Specialty:  Family Medicine   Contact information:   [place] [address] [phone]        Consults obtained - PCCM  Discharge Condition: Stable  Diet and Activity recommendation: See Discharge Instructions below  Discharge Instructions       Discharge Instructions    Diet - low sodium heart healthy    Complete by:  As directed      Discharge instructions    Complete by:  As directed   Follow with Primary MD [name], MD in 7 days   Get CBC, CMP, 2 view Chest X ray checked  by Primary MD next visit.    Activity: As tolerated with Full fall precautions use walker/cane & assistance as needed   Disposition Home     Diet:   Heart Healthy   Check your Weight same time everyday, if you gain over 2 pounds, or you develop in leg swelling, experience more shortness of breath or chest pain, call your Primary MD immediately. Follow Cardiac Low Salt Diet and 1.5 lit/day fluid restriction.   On your next visit with your primary care physician please Get Medicines reviewed and adjusted.   Please request your Prim.MD to go over all Hospital Tests and Procedure/Radiological results at the follow up, please get all Hospital records sent to your Prim MD by signing hospital release before you go home.   If you experience worsening of your admission symptoms, develop shortness of breath, life threatening emergency, suicidal or homicidal thoughts you must seek medical attention immediately by calling 911 or calling your MD immediately  if symptoms less severe.  You Must read complete instructions/literature along with all the possible adverse reactions/side effects for all the Medicines you take and that have been prescribed to you. Take any new  Medicines after you have completely understood and accpet all the possible adverse reactions/side effects.   Do not drive, operate heavy machinery, perform activities at heights, swimming or participation in water activities or provide  baby sitting services if your were admitted for syncope or siezures until you have seen by Primary MD or a Neurologist and advised to do so again.  Do not drive when taking Pain medications.    Do not take more than prescribed Pain, Sleep and Anxiety Medications  Special Instructions: If you have smoked or chewed Tobacco  in the last 2 yrs please stop smoking, stop any regular Alcohol  and or any Recreational drug use.  Wear Seat belts while driving.   Please note  You were cared for by a hospitalist during your hospital stay. If you have any questions about your discharge medications or the care you received while you were in the hospital after you are discharged, you can call the unit and asked to speak with the hospitalist on call if the hospitalist that took care of you is not available. Once you are discharged, your primary care physician will handle any further medical issues. Please note that NO REFILLS for any discharge medications will be authorized once you are discharged, as it is imperative that you return to your primary care physician (or establish a relationship with a primary care physician if you do not have one) for your aftercare needs so that they can reassess your need for medications and monitor your lab values.     Increase activity slowly    Complete by:  As directed              Discharge Medications       Medication List    TAKE these medications        aspirin 81 MG chewable tablet  Chew 1 tablet (81 mg total) by mouth daily.     azithromycin 500 MG tablet  Commonly known as:  ZITHROMAX  Take 1 tablet (500 mg total) by mouth daily.     CARTIA XT 120 MG 24 hr capsule  Generic drug:  diltiazem  TAKE ONE CAPSULE BY  MOUTH DAILY     cefpodoxime 200 MG tablet  Commonly known as:  VANTIN  Take 1 tablet (200 mg total) by mouth 2 (two) times daily.     furosemide 40 MG tablet  Commonly known as:  LASIX  TAKE 1 TABLET BY MOUTH EVERY DAY     montelukast 10 MG tablet  Commonly known as:  SINGULAIR  TAKE 1 TABLET BY MOUTH EVERY NIGHT AT BEDTIME     potassium chloride SA 20 MEQ tablet  Commonly known as:  K-DUR,KLOR-CON  TAKE 1/2 TABLET(10 MEQ) BY MOUTH DAILY     predniSONE 5 MG tablet  Commonly known as:  DELTASONE  Label  & dispense according to the schedule below. 10 Pills PO for 3 days then, 8 Pills PO for 3 days, 6 Pills PO for 3 days, 4 Pills PO for 3 days, 2 Pills PO for 3 days, 1 Pills PO for 3 days, 1/2 Pill  PO for 3 days then STOP. Total 95 pills.     pyridOXINE 100 MG tablet  Commonly known as:  VITAMIN B-6  Take 100 mg by mouth daily.     vitamin B-12 1000 MCG tablet  Commonly known as:  CYANOCOBALAMIN  Take 1,000 mcg by mouth daily.     Vitamin D-3 1000 units Caps  Take 1,000 Units by mouth daily.        Major procedures and Radiology Reports - PLEASE review detailed and final reports for all details, in brief -     Dg Chest  Port 1 View  12/19/2015  CLINICAL DATA:  Respiratory failure.  COPD. EXAM: PORTABLE CHEST 1 VIEW COMPARISON:  12/18/2015 FINDINGS: Mild cardiomegaly and vascular congestion. Left basilar airspace opacity. Right lung is clear. No effusions or acute bony abnormality. IMPRESSION: Cardiomegaly, vascular congestion. Left base atelectasis or infiltrate. Electronically Signed   By: Rolm Baptise M.D.   On: 12/19/2015 07:10   Dg Chest Port 1 View  12/18/2015  CLINICAL DATA:  Decreased oxygen saturations EXAM: PORTABLE CHEST 1 VIEW COMPARISON:  12/18/2015 FINDINGS: Normal heart size. There is diminished aeration to the left lung base. Opacity noted within the right infrahilar region appears improved from previous exam. Aortic atherosclerosis noted. IMPRESSION: 1. Left  base opacity which may represent pneumonia or atelectasis. Electronically Signed   By: Kerby Moors M.D.   On: 12/18/2015 07:25   Dg Chest Port 1 View  12/18/2015  CLINICAL DATA:  Shortness of breath EXAM: PORTABLE CHEST 1 VIEW COMPARISON:  07/28/2014 FINDINGS: Bronchitic markings and hyperinflation. Streaky right infrahilar opacity. No edema, effusion, or pneumothorax. No suspected cardiomegaly when accounting for mediastinal fat pad. IMPRESSION: 1. Right infrahilar atelectasis or pneumonia. 2. COPD. Electronically Signed   By: Monte Fantasia M.D.   On: 12/18/2015 00:24    Micro Results      Recent Results (from the past 240 hour(s))  Blood culture (routine x 2)     Status: None (Preliminary result)   Collection Time: 12/18/15  2:24 AM  Result Value Ref Range Status   Specimen Description BLOOD RIGHT ANTECUBITAL  Final   Special Requests BOTTLES DRAWN AEROBIC AND ANAEROBIC 5CC  Final   Culture   Final    NO GROWTH 2 DAYS Performed at [facility]    Report Status PENDING  Incomplete  Blood culture (routine x 2)     Status: None (Preliminary result)   Collection Time: 12/18/15  2:24 AM  Result Value Ref Range Status   Specimen Description BLOOD RIGHT HAND  Final   Special Requests IN PEDIATRIC BOTTLE 4CC  Final   Culture   Final    NO GROWTH 2 DAYS Performed at [facility]    Report Status PENDING  Incomplete  MRSA PCR Screening     Status: None   Collection Time: 12/18/15  3:24 AM  Result Value Ref Range Status   MRSA by PCR NEGATIVE NEGATIVE Final    Comment:        The GeneXpert MRSA Assay (FDA approved for NASAL specimens only), is one component of a comprehensive MRSA colonization surveillance program. It is not intended to diagnose MRSA infection nor to guide or monitor treatment for MRSA infections.     Today   Subjective    Cornell Gaber today has no headache,no chest abdominal pain,no new weakness tingling or numbness, feels much better  wants to go home today.     Objective   Blood pressure 118/68, pulse 55, temperature 97.8 F (36.6 C), temperature source Oral, resp. rate 16, height '5\' 4"'$  (1.626 m), weight 98.5 kg (217 lb 2.5 oz), SpO2 92 %.   Intake/Output Summary (Last 24 hours) at 12/21/15 1000 Last data filed at 12/21/15 0900  Gross per 24 hour  Intake    419 ml  Output      0 ml  Net    419 ml    Exam Awake Alert, Oriented x 3, No new F.N deficits, Normal affect [place].AT,PERRAL Supple Neck,No JVD, No cervical lymphadenopathy appriciated.  Symmetrical Chest wall  movement, Good air movement bilaterally, minimal wheezing RRR,No Gallops,Rubs or new Murmurs, No Parasternal Heave +ve B.Sounds, Abd Soft, Non tender, No organomegaly appriciated, No rebound -guarding or rigidity. No Cyanosis, Clubbing or edema, No new Rash or bruise   Data Review   CBC w Diff: Lab Results  Component Value Date   WBC 13.3* 12/21/2015   HGB 14.0 12/21/2015   HCT 44.4 12/21/2015   PLT 279 12/21/2015   LYMPHOPCT 17 12/18/2015   MONOPCT 7 12/18/2015   EOSPCT 1 12/18/2015   BASOPCT 0 12/18/2015    CMP: Lab Results  Component Value Date   NA 139 12/19/2015   K 4.3 12/19/2015   CL 96* 12/19/2015   CO2 37* 12/19/2015   BUN 14 12/19/2015   CREATININE 0.64 12/19/2015   PROT 7.2 12/18/2015   ALBUMIN 3.3* 12/18/2015   BILITOT 0.5 12/18/2015   ALKPHOS 73 12/18/2015   AST 23 12/18/2015   ALT 21 12/18/2015  .   Total Time in preparing paper work, data evaluation and todays exam - 35 minutes  Thurnell Lose M.D on 12/21/2015 at 10:00 AM  [facility]   Office  [phone]

## 2015-12-22 ENCOUNTER — Telehealth: Payer: Self-pay | Admitting: *Deleted

## 2015-12-22 NOTE — Telephone Encounter (Signed)
Transitional Care Management follow-up call made. Left message on voicemail for patient to call back.

## 2015-12-22 NOTE — Telephone Encounter (Signed)
Transition Care Management Follow-up Telephone Call   Date discharged? 12/21/15   How have you been since you were released from the hospital? Doing much better   Do you understand why you were in the hospital? yes   Do you understand the discharge instructions? yes   Where were you discharged to? Home   Items Reviewed:  Medications reviewed: yes  Allergies reviewed: yes  Dietary changes reviewed: no  Referrals reviewed: yes  Pulmonary Eric Form NP  12/27/15   Functional Questionnaire:   Activities of Daily Living (ADLs):   She states they are independent in the following: ambulation, bathing and hygiene, feeding, continence, grooming, toileting and dressing States they require assistance with the following: None- back to baseline   Any transportation issues/concerns?: no   Any patient concerns? no   Confirmed importance and date/time of follow-up visits scheduled yes  Provider Appointment booked with Allie Bossier NP 12/28/15 at 11:00 am  Confirmed with patient if condition begins to worsen call PCP or go to the ER.  Patient was given the office number and encouraged to call back with question or concerns.  : yes

## 2015-12-23 LAB — CULTURE, BLOOD (ROUTINE X 2)
Culture: NO GROWTH
Culture: NO GROWTH

## 2015-12-27 ENCOUNTER — Telehealth: Payer: Self-pay | Admitting: Acute Care

## 2015-12-27 ENCOUNTER — Encounter: Payer: Self-pay | Admitting: Acute Care

## 2015-12-27 ENCOUNTER — Ambulatory Visit (INDEPENDENT_AMBULATORY_CARE_PROVIDER_SITE_OTHER)
Admission: RE | Admit: 2015-12-27 | Discharge: 2015-12-27 | Disposition: A | Discharge status: Home / Self Care | Payer: 59 | Source: Ambulatory Visit | Attending: Acute Care | Admitting: Acute Care

## 2015-12-27 ENCOUNTER — Ambulatory Visit (INDEPENDENT_AMBULATORY_CARE_PROVIDER_SITE_OTHER): Payer: 59 | Admitting: Acute Care

## 2015-12-27 VITALS — BP 122/78 | HR 71 | Ht 64.0 in | Wt 224.0 lb

## 2015-12-27 DIAGNOSIS — G4733 Obstructive sleep apnea (adult) (pediatric): Secondary | ICD-10-CM | POA: Diagnosis not present

## 2015-12-27 DIAGNOSIS — J189 Pneumonia, unspecified organism: Secondary | ICD-10-CM | POA: Diagnosis not present

## 2015-12-27 DIAGNOSIS — J449 Chronic obstructive pulmonary disease, unspecified: Secondary | ICD-10-CM

## 2015-12-27 MED ORDER — ALBUTEROL SULFATE 108 (90 BASE) MCG/ACT IN AEPB: 2.0000 | INHALATION_SPRAY | Freq: Four times a day (QID) | RESPIRATORY_TRACT | Status: DC | PRN

## 2015-12-27 MED ORDER — ALBUTEROL SULFATE HFA 108 (90 BASE) MCG/ACT IN AERS: 2.0000 | INHALATION_SPRAY | Freq: Four times a day (QID) | RESPIRATORY_TRACT | Status: DC | PRN

## 2015-12-27 MED ORDER — FLUTICASONE FUROATE-VILANTEROL 100-25 MCG/INH IN AEPB: 1.0000 | INHALATION_SPRAY | Freq: Every day | RESPIRATORY_TRACT | Status: DC

## 2015-12-27 NOTE — Assessment & Plan Note (Signed)
Resolved CAP Plan: We will have your medical equipment company evaluate your machine. We will also have them come and check your concentrator.  Continue wearing your oxygen at 4L with exertion and sleep. Try to wean back to your 2L at rest as able maintaining saturations at > 88%. Start Breo i puff once daily. Rinse your mouth after use. CXR on the way out today. We will call you with results. This is your maintenance inhaler, you use it every day without fail. We send a prescription for a rescue inhaler. [place] e this as needed for shortness of breath or wheezing every 6 hours. We will have you evaluated/ fitted for a new mask for your CPAP machine. Wear your  CPAP at bedtime every night. Goal is to wear for at least 4-6 hours each night for maximal clinical benefit. Continue to work on weight loss, as the link between excess weight  and sleep apnea is well established.  Do not drive if sleepy. Follow up with Dr. Elsworth Soho in 8 weeks.follow up Please contact office for sooner follow up if symptoms do not improve or worsen or seek emergency care

## 2015-12-27 NOTE — Progress Notes (Signed)
History of Present Illness Deborah [name] is a 60 y.o. female  with PMH of obesity, mild COPD, OSA adm 07/2013 with hypercarbic, hypoxic resp failure due to volume overload from previously undx CHF.   12/27/2015 Hospital Follow:Pt. States she is feeling  better than she has  in years.She is not using her CPAP. She is continuing her lasix but has noted a slight increase in her lower extremity swelling. She goes to see her PCP tomorrow. They regulate her lasix.She will follow up with them about the new edema. She denies fever, chest pain, purulent secretions orthopnea or dyspnea. No leg or calf pain.No cough.   STUDIES:  06/05/13 PFT >> FEV1 1.58 (60%), FEV1% 81, TLC 4.13 (84%), DLCO 60% 07/26/14 Echo >> EF 50 to 79%, grade 1 diastolic CHF, severe RV dilation, mod TR, PAS 38 mmHg 09/07/14 PSG >> AHI 6.8  CULTURES: 6/03 Blood >> Negative  ANTIBIOTICS: 6/02 Zithromax >>  6/02 Rocephin >>   SIGNIFICANT EVENTS: 6/02 Admit, BiPAP  Past medical hx Past Medical History  Diagnosis Date  . Umbilical hernia   . Asthma   . Anxiety   . Depression   . Shortness of breath   . Pneumonia     hx of  . Arthritis   . Anemia     as teen  . PONV (postoperative nausea and vomiting)   . Family history of anesthesia complication     vomiting  . Histoplasmosis     left eye  . Hypertension   . Restrictive lung disease   . COPD, mild ([place])   . Heart failure ([place])     New onset 07/25/14  . Obesity (BMI 30-39.9)   . Tobacco abuse   . Hyperkalemia   . CHF (congestive heart failure) ([facility])      Past surgical hx, Family hx, Social hx all reviewed.  Current Outpatient Prescriptions on File Prior to Visit  Medication Sig  . aspirin 81 MG chewable tablet Chew 1 tablet (81 mg total) by mouth daily.  Marland Kitchen CARTIA XT 120 MG 24 hr capsule TAKE ONE CAPSULE BY MOUTH DAILY (Patient taking differently: TAKE 120 MG BY MOUTH DAILY)  . cefpodoxime (VANTIN) 200 MG tablet Take 1 tablet (200 mg total) by  mouth 2 (two) times daily.  . Cholecalciferol (VITAMIN D-3) 1000 units CAPS Take 1,000 Units by mouth daily.  . furosemide (LASIX) 40 MG tablet TAKE 1 TABLET BY MOUTH EVERY DAY (Patient taking differently: TAKE 40 MG BY MOUTH EVERY DAY)  . montelukast (SINGULAIR) 10 MG tablet TAKE 1 TABLET BY MOUTH EVERY NIGHT AT BEDTIME (Patient taking differently: TAKE 10 MG BY MOUTH EVERY NIGHT AT BEDTIME)  . potassium chloride SA (K-DUR,KLOR-CON) 20 MEQ tablet TAKE 1/2 TABLET(10 MEQ) BY MOUTH DAILY (Patient taking differently: Take 10 mEq by mouth daily. )  . predniSONE (DELTASONE) 5 MG tablet Label  & dispense according to the schedule below. 10 Pills PO for 3 days then, 8 Pills PO for 3 days, 6 Pills PO for 3 days, 4 Pills PO for 3 days, 2 Pills PO for 3 days, 1 Pills PO for 3 days, 1/2 Pill  PO for 3 days then STOP. Total 95 pills.  . pyridOXINE (VITAMIN B-6) 100 MG tablet Take 100 mg by mouth daily.  . vitamin B-12 (CYANOCOBALAMIN) 1000 MCG tablet Take 1,000 mcg by mouth daily.   No current facility-administered medications on file prior to visit.     No Known Allergies  Review Of Systems:  Constitutional:   No  weight loss, night sweats,  Fevers, chills, fatigue, or  lassitude.  HEENT:   No headaches,  Difficulty swallowing,  Tooth/dental problems, or  Sore throat,                No sneezing, itching, ear ache, nasal congestion, post nasal drip,   CV:  No chest pain,  Orthopnea, PND, +swelling in lower extremities, anasarca, dizziness, palpitations, syncope.   GI  No heartburn, indigestion, abdominal pain, nausea, vomiting, diarrhea, change in bowel habits, loss of appetite, bloody stools.   Resp: No shortness of breath with exertion or at rest.  No excess mucus, no productive cough,  No non-productive cough,  No coughing up of blood.  No change in color of mucus.  No wheezing.  No chest wall deformity  Skin: no rash or lesions.  GU: no dysuria, change in color of urine, no urgency or frequency.   No flank pain, no hematuria   MS:  No joint pain or swelling.  No decreased range of motion.  No back pain.  Psych:  No change in mood or affect. No depression or anxiety.  No memory loss.   Vital Signs BP 122/78 mmHg  Pulse 71  Ht '5\' 4"'$  (1.626 m)  Wt 224 lb (101.606 kg)  BMI 38.43 kg/m2  SpO2 95%   Physical Exam:  General- No distress,  A&Ox3, obese ENT: No sinus tenderness, TM clear, pale nasal mucosa, no oral exudate,no post nasal drip, no LAN Cardiac: S1, S2, regular rate and rhythm, no murmur Chest: No wheeze/ rales/ dullness; no accessory muscle use, no nasal flaring, no sternal retractions Abd.: Soft Non-tender Ext: No clubbing cyanosis, edema Neuro:  normal strength Skin: No rashes, warm and dry Psych: normal mood and behavior   Assessment/Plan  CAP (community acquired pneumonia) Resolved CAP Plan: We will have your medical equipment company evaluate your machine. We will also have them come and check your concentrator.  Continue wearing your oxygen at 4L with exertion and sleep. Try to wean back to your 2L at rest as able maintaining saturations at > 88%. Start Breo i puff once daily. Rinse your mouth after use. CXR on the way out today. We will call you with results. This is your maintenance inhaler, you use it every day without fail. We send a prescription for a rescue inhaler. [place] e this as needed for shortness of breath or wheezing every 6 hours. We will have you evaluated/ fitted for a new mask for your CPAP machine. Wear your  CPAP at bedtime every night. Goal is to wear for at least 4-6 hours each night for maximal clinical benefit. Continue to work on weight loss, as the link between excess weight  and sleep apnea is well established.  Do not drive if sleepy. Follow up with Dr. Elsworth Soho in 8 weeks.follow up Please contact office for sooner follow up if symptoms do not improve or worsen or seek emergency care       OSA (obstructive sleep  apnea) Currently not using CPAP Plan: We will have your medical equipment company evaluate your machine. We will also have them come and check your concentrator.  Continue wearing your oxygen at 4L with exertion and sleep. Try to wean back to your 2L at rest as able maintaining saturations at > 88%. Start Breo i puff once daily. Rinse your mouth after use. CXR on the way out today. We will call you with results. This is your maintenance  inhaler, you use it every day without fail. We send a prescription for a rescue inhaler. [place] e this as needed for shortness of breath or wheezing every 6 hours. We will have you evaluated/ fitted for a new mask for your CPAP machine. Wear your  CPAP at bedtime every night. Goal is to wear for at least 4-6 hours each night for maximal clinical benefit. Continue to work on weight loss, as the link between excess weight  and sleep apnea is well established.  Do not drive if sleepy. Follow up with Dr. Elsworth Soho in 8 weeks.follow up Please contact office for sooner follow up if symptoms do not improve or worsen or seek emergency care          [name], NP 12/27/2015  4:42 PM

## 2015-12-27 NOTE — Assessment & Plan Note (Signed)
Currently not using CPAP Plan: We will have your medical equipment company evaluate your machine. We will also have them come and check your concentrator.  Continue wearing your oxygen at 4L with exertion and sleep. Try to wean back to your 2L at rest as able maintaining saturations at > 88%. Start Breo i puff once daily. Rinse your mouth after use. CXR on the way out today. We will call you with results. This is your maintenance inhaler, you use it every day without fail. We send a prescription for a rescue inhaler. [place] e this as needed for shortness of breath or wheezing every 6 hours. We will have you evaluated/ fitted for a new mask for your CPAP machine. Wear your  CPAP at bedtime every night. Goal is to wear for at least 4-6 hours each night for maximal clinical benefit. Continue to work on weight loss, as the link between excess weight  and sleep apnea is well established.  Do not drive if sleepy. Follow up with Dr. Elsworth Soho in 8 weeks.follow up Please contact office for sooner follow up if symptoms do not improve or worsen or seek emergency care

## 2015-12-27 NOTE — Patient Instructions (Addendum)
It is nice to meet you today. We will have your medical equipment company evaluate your machine. We will also have them come and check your concentrator.  Continue wearing your oxygen at 4L with exertion and sleep. Try to wean back to your 2L at rest as able maintaining saturations at > 88%. Start Breo i puff once daily. Rinse your mouth after use. CXR on the way out today. We will call you with results. This is your maintenance inhaler, you use it every day without fail. We send a prescription for a rescue inhaler. [place] e this as needed for shortness of breath or wheezing every 6 hours. We will have you evaluated/ fitted for a new mask for your CPAP machine. Wear your  CPAP at bedtime every night. Goal is to wear for at least 4-6 hours each night for maximal clinical benefit. Continue to work on weight loss, as the link between excess weight  and sleep apnea is well established.  Do not drive if sleepy. Follow up with Dr. Elsworth Soho in 8 weeks.follow up Please contact office for sooner follow up if symptoms do not improve or worsen or seek emergency care

## 2015-12-27 NOTE — Telephone Encounter (Signed)
Please let Deborah [name] know her CXR indicated her pneumonia has resolved. Thanks so much.

## 2015-12-27 NOTE — Progress Notes (Signed)
Patient ID: Deborah [name], female   DOB: Jan 25, 1956, 60 y.o.   MRN: [id number]   Patient seen in the office today and instructed on use of Breo.  Patient expressed understanding and demonstrated technique.

## 2015-12-28 ENCOUNTER — Ambulatory Visit (INDEPENDENT_AMBULATORY_CARE_PROVIDER_SITE_OTHER): Payer: 59 | Admitting: Primary Care

## 2015-12-28 ENCOUNTER — Encounter: Payer: Self-pay | Admitting: Primary Care

## 2015-12-28 ENCOUNTER — Telehealth: Payer: Self-pay | Admitting: Radiology

## 2015-12-28 VITALS — BP 122/80 | HR 87 | Temp 98.0°F | Ht 64.0 in | Wt 225.4 lb

## 2015-12-28 DIAGNOSIS — Z09 Encounter for follow-up examination after completed treatment for conditions other than malignant neoplasm: Secondary | ICD-10-CM | POA: Diagnosis not present

## 2015-12-28 DIAGNOSIS — R600 Localized edema: Secondary | ICD-10-CM | POA: Diagnosis not present

## 2015-12-28 LAB — COMPREHENSIVE METABOLIC PANEL
ALT: 70 U/L — ABNORMAL HIGH (ref 0–35)
AST: 36 U/L (ref 0–37)
Albumin: 3.5 g/dL (ref 3.5–5.2)
Alkaline Phosphatase: 57 U/L (ref 39–117)
BUN: 24 mg/dL — ABNORMAL HIGH (ref 6–23)
CO2: 35 mEq/L — ABNORMAL HIGH (ref 19–32)
Calcium: 9 mg/dL (ref 8.4–10.5)
Chloride: 96 mEq/L (ref 96–112)
Creatinine, Ser: 0.8 mg/dL (ref 0.40–1.20)
GFR: 78.42 mL/min (ref >60.00)
Glucose, Bld: 86 mg/dL (ref 70–99)
Potassium: 3.8 mEq/L (ref 3.5–5.1)
Sodium: 139 mEq/L (ref 135–145)
Total Bilirubin: 0.6 mg/dL (ref 0.2–1.2)
Total Protein: 6.5 g/dL (ref 6.0–8.3)

## 2015-12-28 LAB — CBC
HCT: 46.4 % — ABNORMAL HIGH (ref 36.0–46.0)
Hemoglobin: 15.1 g/dL — ABNORMAL HIGH (ref 12.0–15.0)
MCHC: 32.5 g/dL (ref 30.0–36.0)
MCV: 93.1 fl (ref 78.0–100.0)
Platelets: 247 10*3/uL (ref 150.0–400.0)
RBC: 4.99 Mil/uL (ref 3.87–5.11)
RDW: 15.5 % (ref 11.5–15.5)
WBC: 17.9 10*3/uL — ABNORMAL HIGH (ref 4.0–10.5)

## 2015-12-28 MED ORDER — FUROSEMIDE 20 MG PO TABS: ORAL_TABLET | ORAL | Status: DC

## 2015-12-28 NOTE — Telephone Encounter (Signed)
[facility] called a critical WBC - 17.9, results given to Dr Diona Browner.

## 2015-12-28 NOTE — Telephone Encounter (Signed)
Pt with recent PNA.. WBC was improving on 6/6 at D/C but is not increasing back up some, unclear if trend or fluctuation. CXR 6/12 clear. At today's OV, pt was improving  significantlyper note.  Also pt on prednisone which may be causing wbc to be persistently elevated.   Recommend following if pt declines and make sure back at baseline  when off prednisone.  CC: Allie Bossier and Dr. [name]

## 2015-12-28 NOTE — Progress Notes (Signed)
Subjective:    Patient ID: Deborah [name], female    DOB: Nov 05, 1955, 60 y.o.   MRN: [id number]  HPI  Deborah [name] is a 60 year old female with a history of HTN, COPD on 4 liters of oxygen HS, CHF, who presents today for hospital follow up.  She presented to the emergency department on 12/17/15 with complaints of multiple falls stating that her legs continued to "give out". She also reported needing her oxygen during the day as she felt more short of breath than usual. She underwent labs, chest xray, and ECG in the ED. Labs with leukocytosis, xray showing possible pneumonia. She was placed on bi-pap due to wheezing, hypoxia, and ABG with pH of 7.29. She was then admitted for Acute on chronic respiratory failure with Community Acquired Pneumonia.   During her hospitalization she underwent treatment with IV Solu-Medrol, antibiotics, Bi-Pap. ABG's improved and was able to wean off Bi-Pap on 12/21/15. She was discharged home on 12/21/15 with a prescription for prednisone, oral antibiotics, and recommendation for PCP follow up, and Pulmonology follow up. She is due for repeat CBC, CMP, and chest xray today.  Since her discharge she's feeling much improved. She is worried that she may feel worse after she comes off her prednisone taper. She followed up with pulmonary yesterday who did a repeat chest xray that was unremarkable. She was placed on a Breo and albuterol inhaler. She has follow up scheduled for August 2017.   She's using her oxygen 24 hours daily but is trying to wean her oxygen down to 2 liters when at rest. Denies shortness of breath at rest and with mild exertion. She has a history of lower extremity edema and is currently managed on Lasix 40 mg once daily. She noticed that her edema has not completely resolved for the past 6 months. Denies chest pain. She does elevate her extremities when sitting. She has an extra bottle of Lasix 20 mg that she has been using as needed for additional edema  with improvement.  Review of Systems  Constitutional: Negative for fever and fatigue.  Respiratory: Negative for cough, shortness of breath and wheezing.   Cardiovascular: Positive for leg swelling. Negative for chest pain.  Neurological: Negative for dizziness, weakness and headaches.       Past Medical History  Diagnosis Date  . Umbilical hernia   . Asthma   . Anxiety   . Depression   . Shortness of breath   . Pneumonia     hx of  . Arthritis   . Anemia     as teen  . PONV (postoperative nausea and vomiting)   . Family history of anesthesia complication     vomiting  . Histoplasmosis     left eye  . Hypertension   . Restrictive lung disease   . COPD, mild ([place])   . Heart failure ([place])     New onset 07/25/14  . Obesity (BMI 30-39.9)   . Tobacco abuse   . Hyperkalemia   . CHF (congestive heart failure) ([place])      Social History   Social History  . Marital Status: Married    Spouse Name: N/A  . Number of Children: 1  . Years of Education: N/A   Occupational History  . Retired    Social History Main Topics  . Smoking status: Former Smoker -- 3.00 packs/day for 43 years    Types: Cigarettes    Quit date: 12/17/2012  . Smokeless tobacco:  Not on file  . Alcohol Use: 0.0 oz/week    0 Standard drinks or equivalent per week  . Drug Use: No  . Sexual Activity: Not on file   Other Topics Concern  . Not on file   Social History Narrative   ** Merged History Encounter **       ** Data from: 07/25/14 Enc Dept: [facility]       ** Data from: 06/05/13 Enc Dept: [facility]   Married, one son 39 yo.             Past Surgical History  Procedure Laterality Date  . Cesarean section  04/12/85  . Ventral hernia repair N/A 03/19/2013    Procedure:  OPEN VENTRAL HERNIA REPAIR WITH MESH AND APPLICATION OF WOUND VAC;  Surgeon: Madilyn Hook, DO;  Location: [facility];  Service: General;  Laterality: N/A;  . Insertion of mesh N/A 03/19/2013    Procedure:  INSERTION OF MESH;  Surgeon: Madilyn Hook, DO;  Location: [facility];  Service: General;  Laterality: N/A;  . Application of wound vac  03/19/2013    Procedure: APPLICATION OF WOUND VAC;  Surgeon: Madilyn Hook, DO;  Location: [facility];  Service: General;;    Family History  Problem Relation Age of Onset  . Heart attack Mother   . Emphysema Mother     was a smoker  . Lymphoma Maternal Uncle   . Pancreatic cancer Maternal Aunt   . Emphysema      Mother  . Heart failure      Father  . Cancer      Unknown    No Known Allergies  Current Outpatient Prescriptions on File Prior to Visit  Medication Sig Dispense Refill  . albuterol (PROVENTIL HFA;VENTOLIN HFA) 108 (90 Base) MCG/ACT inhaler Inhale 2 puffs into the lungs every 6 (six) hours as needed for wheezing or shortness of breath. 1 Inhaler 2  . aspirin 81 MG chewable tablet Chew 1 tablet (81 mg total) by mouth daily. 30 tablet 1  . CARTIA XT 120 MG 24 hr capsule TAKE ONE CAPSULE BY MOUTH DAILY (Patient taking differently: TAKE 120 MG BY MOUTH DAILY) 30 capsule 3  . cefpodoxime (VANTIN) 200 MG tablet Take 1 tablet (200 mg total) by mouth 2 (two) times daily. 10 tablet 0  . Cholecalciferol (VITAMIN D-3) 1000 units CAPS Take 1,000 Units by mouth daily.    . fluticasone furoate-vilanterol (BREO ELLIPTA) 100-25 MCG/INH AEPB Inhale 1 puff into the lungs daily. 1 each 5  . montelukast (SINGULAIR) 10 MG tablet TAKE 1 TABLET BY MOUTH EVERY NIGHT AT BEDTIME (Patient taking differently: TAKE 10 MG BY MOUTH EVERY NIGHT AT BEDTIME) 90 tablet 0  . potassium chloride SA (K-DUR,KLOR-CON) 20 MEQ tablet TAKE 1/2 TABLET(10 MEQ) BY MOUTH DAILY (Patient taking differently: Take 10 mEq by mouth daily. ) 45 tablet 3  . predniSONE (DELTASONE) 5 MG tablet Label  & dispense according to the schedule below. 10 Pills PO for 3 days then, 8 Pills PO for 3 days, 6 Pills PO for 3 days, 4 Pills PO for 3 days, 2 Pills PO for 3 days, 1 Pills PO for 3 days, 1/2 Pill  PO for 3 days  then STOP. Total 95 pills. 95 tablet 0  . pyridOXINE (VITAMIN B-6) 100 MG tablet Take 100 mg by mouth daily.    . vitamin B-12 (CYANOCOBALAMIN) 1000 MCG tablet Take 1,000 mcg by mouth daily.     No current facility-administered medications  on file prior to visit.    BP 122/80 mmHg  Pulse 87  Temp(Src) 98 F (36.7 C) (Oral)  Ht '5\' 4"'$  (1.626 m)  Wt 225 lb 6.4 oz (102.241 kg)  BMI 38.67 kg/m2  SpO2 93%    Objective:   Physical Exam  Constitutional: She appears well-nourished.  Neck: Neck supple.  Cardiovascular: Normal rate and regular rhythm.   Mild to moderate lower extremity edema, trace pitting.  Pulmonary/Chest: Effort normal and breath sounds normal. She has no wheezes.  Skin: Skin is warm and dry.  Psychiatric: She has a normal mood and affect.          Assessment & Plan:  Hospital follow-up:  Admitted to [facility] on 12/17/2015 for acute on chronic respiratory failure and community-acquired pneumonia. Greatly improved during hospitalization with breathing treatments, BiPAP, IV antibiotics, IV steroids. Since her discharge she is feeling much improved and has already followed up with pulmonology who has prescribed Breo and albuterol inhalers for her shortness of breath. She is working to wean down on oxygen use. Exam today without wheezing, decreased breath sounds, rhonchi. She is not short of breath. Will repeat CMP and CBC today, chest x-ray checked yesterday and is unremarkable. Encouraged her to follow-up with PCP or pulmonology with any changes in breathing. She is scheduled for pulmonology reevaluation in August 2017.  All labs, imaging, notes from hospitalization reviewed.  Sheral Flow, NP

## 2015-12-28 NOTE — Patient Instructions (Addendum)
I have changed your Lasix dose and sent in a new prescription. I've sent in Lasix 20 mg tablets. Take 2 tablets by mouth every morning and 1 tablet by mouth every evening.  Please update Dr. [name] if this is helping to reduce your swelling.  Complete lab work prior to leaving today. I will notify you of your results once received.   Continue to wean down on your daily oxygen use as directed.   Please follow up with Dr. [name] or Pulmonology if your shortness of breath becomes worse, you develop fevers, you start to require an increased amount of your oxygen, your legs do not reduce in size.  It was a pleasure to see you today!

## 2015-12-28 NOTE — Progress Notes (Signed)
Pre visit review using our clinic review tool, if applicable. No additional management support is needed unless otherwise documented below in the visit note. 

## 2015-12-28 NOTE — Telephone Encounter (Signed)
Patient doing quite well today based off examination and presentation. Chest xray clear from yesterday. Suspect that prednisone and IV steroids she received in the hospital causing elevation in WBC count. Would recommend repeat CBC soon. CC Dr. [name]. Vallarie Mare, please notify patient that overall her labs are improved. Please strongly encourage her to call us immediately if she notices any changes including shortness of breath, fatigue, weakness.

## 2015-12-28 NOTE — Telephone Encounter (Signed)
Spoke with pt and gave results. 

## 2015-12-28 NOTE — Progress Notes (Signed)
Reviewed & agree with plan  

## 2015-12-29 NOTE — Telephone Encounter (Signed)
Spoken and notified patient of Kate's comments. Patient verbalized understanding. 

## 2016-01-07 ENCOUNTER — Telehealth: Payer: Self-pay | Admitting: Family Medicine

## 2016-01-07 ENCOUNTER — Emergency Department (HOSPITAL_COMMUNITY): Payer: 59

## 2016-01-07 ENCOUNTER — Observation Stay (HOSPITAL_COMMUNITY)
Admission: EM | Admit: 2016-01-07 | Discharge: 2016-01-09 | Disposition: A | Discharge status: Home / Self Care | Payer: 59 | Attending: Internal Medicine | Admitting: Internal Medicine

## 2016-01-07 ENCOUNTER — Encounter (HOSPITAL_COMMUNITY): Payer: Self-pay | Admitting: *Deleted

## 2016-01-07 DIAGNOSIS — Z79899 Other long term (current) drug therapy: Secondary | ICD-10-CM | POA: Insufficient documentation

## 2016-01-07 DIAGNOSIS — I5032 Chronic diastolic (congestive) heart failure: Secondary | ICD-10-CM | POA: Insufficient documentation

## 2016-01-07 DIAGNOSIS — Z6838 Body mass index (BMI) 38.0-38.9, adult: Secondary | ICD-10-CM | POA: Insufficient documentation

## 2016-01-07 DIAGNOSIS — K922 Gastrointestinal hemorrhage, unspecified: Secondary | ICD-10-CM | POA: Diagnosis present

## 2016-01-07 DIAGNOSIS — K5732 Diverticulitis of large intestine without perforation or abscess without bleeding: Secondary | ICD-10-CM | POA: Diagnosis not present

## 2016-01-07 DIAGNOSIS — K921 Melena: Secondary | ICD-10-CM | POA: Diagnosis present

## 2016-01-07 DIAGNOSIS — J9610 Chronic respiratory failure, unspecified whether with hypoxia or hypercapnia: Secondary | ICD-10-CM | POA: Diagnosis not present

## 2016-01-07 DIAGNOSIS — Z7982 Long term (current) use of aspirin: Secondary | ICD-10-CM | POA: Diagnosis not present

## 2016-01-07 DIAGNOSIS — I110 Hypertensive heart disease with heart failure: Secondary | ICD-10-CM | POA: Insufficient documentation

## 2016-01-07 DIAGNOSIS — J441 Chronic obstructive pulmonary disease with (acute) exacerbation: Secondary | ICD-10-CM | POA: Diagnosis not present

## 2016-01-07 DIAGNOSIS — A419 Sepsis, unspecified organism: Principal | ICD-10-CM | POA: Insufficient documentation

## 2016-01-07 DIAGNOSIS — E669 Obesity, unspecified: Secondary | ICD-10-CM | POA: Diagnosis not present

## 2016-01-07 DIAGNOSIS — Z87891 Personal history of nicotine dependence: Secondary | ICD-10-CM | POA: Insufficient documentation

## 2016-01-07 DIAGNOSIS — L8990 Pressure ulcer of unspecified site, unspecified stage: Secondary | ICD-10-CM | POA: Diagnosis present

## 2016-01-07 DIAGNOSIS — M1990 Unspecified osteoarthritis, unspecified site: Secondary | ICD-10-CM | POA: Diagnosis not present

## 2016-01-07 DIAGNOSIS — K529 Noninfective gastroenteritis and colitis, unspecified: Secondary | ICD-10-CM

## 2016-01-07 DIAGNOSIS — Z9981 Dependence on supplemental oxygen: Secondary | ICD-10-CM | POA: Insufficient documentation

## 2016-01-07 LAB — CBC
HCT: 43.6 % (ref 36.0–46.0)
Hemoglobin: 14 g/dL (ref 12.0–15.0)
MCH: 30.3 pg (ref 26.0–34.0)
MCHC: 32.1 g/dL (ref 30.0–36.0)
MCV: 94.4 fL (ref 78.0–100.0)
Platelets: 225 10*3/uL (ref 150–400)
RBC: 4.62 MIL/uL (ref 3.87–5.11)
RDW: 14.8 % (ref 11.5–15.5)
WBC: 14.6 10*3/uL — ABNORMAL HIGH (ref 4.0–10.5)

## 2016-01-07 LAB — COMPREHENSIVE METABOLIC PANEL
ALT: 28 U/L (ref 14–54)
AST: 20 U/L (ref 15–41)
Albumin: 3.3 g/dL — ABNORMAL LOW (ref 3.5–5.0)
Alkaline Phosphatase: 73 U/L (ref 38–126)
Anion gap: 10 (ref 5–15)
BUN: 25 mg/dL — ABNORMAL HIGH (ref 6–20)
CO2: 25 mmol/L (ref 22–32)
Calcium: 8.8 mg/dL — ABNORMAL LOW (ref 8.9–10.3)
Chloride: 106 mmol/L (ref 101–111)
Creatinine, Ser: 0.9 mg/dL (ref 0.44–1.00)
GFR calc Af Amer: >60 mL/min (ref >60)
GFR calc non Af Amer: >60 mL/min (ref >60)
Glucose, Bld: 141 mg/dL — ABNORMAL HIGH (ref 65–99)
Potassium: 3.7 mmol/L (ref 3.5–5.1)
Sodium: 141 mmol/L (ref 135–145)
Total Bilirubin: 0.5 mg/dL (ref 0.3–1.2)
Total Protein: 6.7 g/dL (ref 6.5–8.1)

## 2016-01-07 LAB — ABO/RH: ABO/RH(D): A POS

## 2016-01-07 LAB — TYPE AND SCREEN
ABO/RH(D): A POS
Antibody Screen: NEGATIVE

## 2016-01-07 MED ORDER — SODIUM CHLORIDE 0.9 % IV SOLN: INTRAVENOUS | Status: DC

## 2016-01-07 MED ORDER — IOPAMIDOL (ISOVUE-300) INJECTION 61%
100.0000 mL | Freq: Once | INTRAVENOUS | Status: AC | PRN
  Administered 2016-01-07: 100 mL via INTRAVENOUS

## 2016-01-07 MED ORDER — SODIUM CHLORIDE 0.9 % IV BOLUS (SEPSIS)
500.0000 mL | Freq: Once | INTRAVENOUS | Status: AC
  Administered 2016-01-07: 500 mL via INTRAVENOUS

## 2016-01-07 NOTE — ED Provider Notes (Signed)
CSN: [id number]     Arrival date & time 01/07/16  1901 History   First MD Initiated Contact with Patient 01/07/16 2056     Chief Complaint  Patient presents with  . Melena     (Consider location/radiation/quality/duration/timing/severity/associated sxs/prior Treatment) HPI   Deborah [name] is a 60 y.o. female who presents for evaluation of bleeding per rectum, occurring tonight, 4 different episodes. No similar problem in the past. No recent fever, chills, nausea, vomiting, cough, shortness of breath, chest pain, weakness or dizziness. She is completing a course of prednisone after a recent hospitalization with community-acquired pneumonia. She does not take anticoagulants. There are no other known modifying factors..   Past Medical History  Diagnosis Date  . Umbilical hernia   . Asthma   . Anxiety   . Depression   . Shortness of breath   . Pneumonia     hx of  . Arthritis   . Anemia     as teen  . PONV (postoperative nausea and vomiting)   . Family history of anesthesia complication     vomiting  . Histoplasmosis     left eye  . Hypertension   . Restrictive lung disease   . COPD, mild ([place])   . Heart failure ([place])     New onset 07/25/14  . Obesity (BMI 30-39.9)   . Tobacco abuse   . Hyperkalemia   . CHF (congestive heart failure) [facility])    Past Surgical History  Procedure Laterality Date  . Cesarean section  04/12/85  . Ventral hernia repair N/A 03/19/2013    Procedure:  OPEN VENTRAL HERNIA REPAIR WITH MESH AND APPLICATION OF WOUND VAC;  Surgeon: Madilyn Hook, DO;  Location: [facility];  Service: General;  Laterality: N/A;  . Insertion of mesh N/A 03/19/2013    Procedure: INSERTION OF MESH;  Surgeon: Madilyn Hook, DO;  Location: [facility];  Service: General;  Laterality: N/A;  . Application of wound vac  03/19/2013    Procedure: APPLICATION OF WOUND VAC;  Surgeon: Madilyn Hook, DO;  Location: [facility];  Service: General;;   Family History  Problem Relation Age of Onset  . Heart  attack Mother   . Emphysema Mother     was a smoker  . Lymphoma Maternal Uncle   . Pancreatic cancer Maternal Aunt   . Emphysema      Mother  . Heart failure      Father  . Cancer      Unknown   Social History  Substance Use Topics  . Smoking status: Former Smoker -- 3.00 packs/day for 43 years    Types: Cigarettes    Quit date: 12/17/2012  . Smokeless tobacco: None  . Alcohol Use: 0.0 oz/week    0 Standard drinks or equivalent per week   OB History    Gravida Para Term Preterm AB TAB SAB Ectopic Multiple Living   0 0 0 0 0 0 0 0       Review of Systems  All other systems reviewed and are negative.     Allergies  Review of patient's allergies indicates no known allergies.  Home Medications   Prior to Admission medications   Medication Sig Start Date End Date Taking? Authorizing Provider  aspirin 81 MG chewable tablet Chew 1 tablet (81 mg total) by mouth daily. 10/05/14  Yes [name], MD  CARTIA XT 120 MG 24 hr capsule TAKE ONE CAPSULE BY MOUTH DAILY Patient taking differently: TAKE 120 MG  BY MOUTH DAILY 12/09/15  Yes [name], MD  Cholecalciferol (VITAMIN D-3) 1000 units CAPS Take 1,000 Units by mouth daily.   Yes Historical Provider, MD  fexofenadine (ALLEGRA) 30 MG tablet Take 30 mg by mouth every other day.   Yes Historical Provider, MD  fluticasone furoate-vilanterol (BREO ELLIPTA) 100-25 MCG/INH AEPB Inhale 1 puff into the lungs daily. 12/27/15  Yes [name], NP  furosemide (LASIX) 20 MG tablet Take 2 tablets by mouth every morning and 1 tablet by mouth every evening. 12/28/15  Yes Pleas Koch, NP  montelukast (SINGULAIR) 10 MG tablet TAKE 1 TABLET BY MOUTH EVERY NIGHT AT BEDTIME Patient taking differently: TAKE 10 MG BY MOUTH EVERY NIGHT AT BEDTIME 12/29/14  Yes [name], MD  potassium chloride SA (K-DUR,KLOR-CON) 20 MEQ tablet TAKE 1/2 TABLET(10 MEQ) BY MOUTH DAILY Patient taking differently: Take 10 mEq by mouth daily.  06/17/15  Yes  [name], MD  predniSONE (DELTASONE) 5 MG tablet Label  & dispense according to the schedule below. 10 Pills PO for 3 days then, 8 Pills PO for 3 days, 6 Pills PO for 3 days, 4 Pills PO for 3 days, 2 Pills PO for 3 days, 1 Pills PO for 3 days, 1/2 Pill  PO for 3 days then STOP. Total 95 pills. 12/21/15  Yes Thurnell Lose, MD  pyridOXINE (VITAMIN B-6) 100 MG tablet Take 100 mg by mouth daily.   Yes Historical Provider, MD  vitamin B-12 (CYANOCOBALAMIN) 1000 MCG tablet Take 1,000 mcg by mouth daily.   Yes Historical Provider, MD  albuterol (PROVENTIL HFA;VENTOLIN HFA) 108 (90 Base) MCG/ACT inhaler Inhale 2 puffs into the lungs every 6 (six) hours as needed for wheezing or shortness of breath. 12/27/15   [name], NP  cefpodoxime (VANTIN) 200 MG tablet Take 1 tablet (200 mg total) by mouth 2 (two) times daily. Patient not taking: Reported on 01/07/2016 12/21/15   Thurnell Lose, MD   BP 102/82 mmHg  Pulse 83  Temp(Src) 97.7 F (36.5 C) (Oral)  Resp 18  Ht '5\' 4"'$  (1.626 m)  Wt 223 lb 12.8 oz (101.515 kg)  BMI 38.40 kg/m2  SpO2 97% Physical Exam  Constitutional: She is oriented to person, place, and time. She appears well-developed and well-nourished.  HENT:  Head: Normocephalic and atraumatic.  Right Ear: External ear normal.  Left Ear: External ear normal.  Eyes: Conjunctivae and EOM are normal. Pupils are equal, round, and reactive to light.  Neck: Normal range of motion and phonation normal. Neck supple.  Cardiovascular: Normal rate, regular rhythm and normal heart sounds.   Pulmonary/Chest: Effort normal and breath sounds normal. She exhibits no bony tenderness.  Abdominal: Soft. There is no tenderness.  Genitourinary:  Normal anus. Melanotic stool in rectum.  Musculoskeletal: Normal range of motion.  Neurological: She is alert and oriented to person, place, and time. No cranial nerve deficit or sensory deficit. She exhibits normal muscle tone. Coordination normal.  Skin:  Skin is warm, dry and intact.  Psychiatric: She has a normal mood and affect. Her behavior is normal. Judgment and thought content normal.  Nursing note and vitals reviewed.   ED Course  Procedures (including critical care time)  Medications  pantoprazole (PROTONIX) 80 mg in sodium chloride 0.9 % 250 mL (0.32 mg/mL) infusion (8 mg/hr Intravenous Restarted 01/08/16 0340)  pantoprazole (PROTONIX) injection 40 mg (not administered)  diltiazem (CARDIZEM CD) 24 hr capsule 120 mg (not administered)  sodium  chloride flush (NS) 0.9 % injection 3 mL (3 mLs Intravenous Not Given 01/08/16 0104)  ipratropium (ATROVENT) nebulizer solution 0.5 mg (not administered)  fluticasone furoate-vilanterol (BREO ELLIPTA) 100-25 MCG/INH 1 puff (1 puff Inhalation Given 01/08/16 0834)  ondansetron (ZOFRAN) tablet 4 mg (not administered)    Or  ondansetron (ZOFRAN) injection 4 mg (not administered)  acetaminophen (TYLENOL) tablet 650 mg (not administered)    Or  acetaminophen (TYLENOL) suppository 650 mg (not administered)  0.9 %  sodium chloride infusion ( Intravenous New Bag/Given 01/08/16 0339)  albuterol (PROVENTIL) (2.5 MG/3ML) 0.083% nebulizer solution 2.5 mg (not administered)  ciprofloxacin (CIPRO) IVPB 400 mg (400 mg Intravenous New Bag/Given 01/08/16 0217)  metroNIDAZOLE (FLAGYL) IVPB 500 mg (500 mg Intravenous New Bag/Given 01/08/16 0622)  sodium chloride 0.9 % bolus 500 mL (0 mLs Intravenous Stopped 01/07/16 2328)  iopamidol (ISOVUE-300) 61 % injection 100 mL (100 mLs Intravenous Contrast Given 01/07/16 2201)  pantoprazole (PROTONIX) 80 mg in sodium chloride 0.9 % 100 mL IVPB (0 mg Intravenous Stopped 01/08/16 0145)    Patient Vitals for the past 24 hrs:  BP Temp Temp src Pulse Resp SpO2 Height Weight  01/08/16 0834 - - - - - 97 % - -  01/08/16 0609 102/82 mmHg 97.7 F (36.5 C) Oral 83 18 99 % - -  01/08/16 0312 118/71 mmHg 97.8 F (36.6 C) Oral 79 18 98 % '5\' 4"'$  (1.626 m) 223 lb 12.8 oz (101.515 kg)   01/08/16 0000 102/67 mmHg - - 87 24 94 % - -  01/07/16 2345 114/79 mmHg - - 85 26 95 % - -  01/07/16 1924 119/88 mmHg 97.7 F (36.5 C) Oral 119 18 94 % - -       Labs Review Labs Reviewed  COMPREHENSIVE METABOLIC PANEL - Abnormal; Notable for the following:    Glucose, Bld 141 (*)    BUN 25 (*)    Calcium 8.8 (*)    Albumin 3.3 (*)    All other components within normal limits  CBC - Abnormal; Notable for the following:    WBC 14.6 (*)    All other components within normal limits  BASIC METABOLIC PANEL - Abnormal; Notable for the following:    Calcium 8.4 (*)    All other components within normal limits  POC OCCULT BLOOD, ED - Abnormal; Notable for the following:    Fecal Occult Bld POSITIVE (*)    All other components within normal limits  CULTURE, BLOOD (ROUTINE X 2)  CULTURE, BLOOD (ROUTINE X 2)  CBC  HEMOGLOBIN AND HEMATOCRIT, BLOOD  LACTIC ACID, PLASMA  LACTIC ACID, PLASMA  TYPE AND SCREEN  ABO/RH    Imaging Review Ct Abdomen Pelvis W Contrast  01/07/2016  CLINICAL DATA:  Patient states she had 4 black bloody stools today EXAM: CT ABDOMEN AND PELVIS WITH CONTRAST TECHNIQUE: Multidetector CT imaging of the abdomen and pelvis was performed using the standard protocol following bolus administration of intravenous contrast. CONTRAST:  164m ISOVUE-300 IOPAMIDOL (ISOVUE-300) INJECTION 61% COMPARISON:  None. FINDINGS: Lower chest: Lung bases shows mild emphysematous changes. Small calcified granulomas are noted right base posteriorly. Hepatobiliary: Enhanced liver is unremarkable. No calcified gallstones are noted within gallbladder. Pancreas: Enhanced pancreas is unremarkable. Spleen: Punctate calcifications are noted within spleen from prior granulomatous disease. No focal splenic mass. Adrenals/Urinary Tract: No adrenal gland mass. Enhanced kidneys are symmetrical in size. No hydronephrosis or hydroureter. Stomach/Bowel: There is no gastric outlet obstruction. No small bowel  obstruction. No pericecal inflammation.  Normal appendix is noted in axial image 41. Scattered colonic diverticula are noted right colon. Multiple colonic diverticula are noted transverse colon and descending colon. Multiple colonic diverticula are noted in sigmoid colon. In axial image 62 there is focal thickening of colonic wall at the junction of descending colon with proximal sigmoid colon left lower quadrant. Minimal stranding of surrounding fat. Please see coronal image 64. Findings are highly suspicious for focal colitis or early diverticulitis. Tiny diverticular bleed cannot be excluded. Clinical correlation is necessary. There is no evidence of pericolonic abscess or hematoma. No extraluminal air is noted. No distal colonic obstruction. Vascular/Lymphatic: Atherosclerotic calcifications of abdominal aorta and iliac arteries are noted. No aortic aneurysm. Reproductive: The uterus is anteflexed normal size for age. No adnexal masses noted. Other: No ascites or free abdominal air. Musculoskeletal: No destructive bony lesions are noted. Mild degenerative changes lower thoracic and lumbar spine. IMPRESSION: 1. Extensive colonic diverticulosis noted. In axial image 62 there is mild focal thickening of diverticular wall and colonic wall in left lower quadrant at the junction of descending colon with sigmoid colon. Mild stranding of pericolonic fat. Focal colitis early diverticulitis or small diverticular bleed cannot be excluded. There is no evidence of pericolonic abscess or hematoma. No extraluminal air. 2. Normal appendix.  No pericecal inflammation. 3. No hydronephrosis or hydroureter. 4. No small bowel obstruction. Electronically Signed   By: Lahoma Crocker M.D.   On: 01/07/2016 22:56   Dg Chest Port 1 View  01/08/2016  CLINICAL DATA:  Acute onset of bloody stools and shortness of breath. Initial encounter. EXAM: PORTABLE CHEST 1 VIEW COMPARISON:  Chest radiograph performed 12/27/2015 FINDINGS: The lungs are  well-aerated and clear. There is no evidence of focal opacification, pleural effusion or pneumothorax. The cardiomediastinal silhouette is borderline enlarged. No acute osseous abnormalities are seen. IMPRESSION: Borderline cardiomegaly.  Lungs remain grossly clear. Electronically Signed   By: Garald Balding M.D.   On: 01/08/2016 02:14   I have personally reviewed and evaluated these images and lab results as part of my medical decision-making.   EKG Interpretation None      MDM   Final diagnoses:  Colitis  Sepsis ([place])    Melena, suspected to be secondary to AVM. Hemodynamically stable. CT ordered to evaluate for unsuspected diverticulitis/colitis.   Nursing Notes Reviewed/ Care Coordinated Applicable Imaging Reviewed Interpretation of Laboratory Data incorporated into ED treatment  Care to doctor Palumbo to evaluate after CT imaging.  Daleen Bo, MD 01/08/16 [phone]

## 2016-01-07 NOTE — ED Notes (Signed)
Pt states she has had 4 black tarry stools today. Pt states she also noticed some bright red blood when she wiped today. Pt denies abdominal pain/nausea/vomiting.

## 2016-01-07 NOTE — Telephone Encounter (Signed)
[facility]  Patient Name: Deborah [name]  DOB: January 26, 1956    Initial Comment Caller states she is having blood in her stool.   Nurse Assessment  Nurse: Harlow Mares, RN, Suanne Marker Date/Time (Eastern Time): 01/07/2016 3:00:39 PM  Confirm and document reason for call. If symptomatic, describe symptoms. You must click the next button to save text entered. ---Caller states she is having blood in her stool. Reports she noted today, the stool was very dark (coffee ground) and when she wiped she saw bright red bleeding. This occurred x1 today. Dx with diverticulitis in the past. Denies abd pain. Denies fever.  Has the patient traveled out of the country within the last 30 days? ---No  Does the patient have any new or worsening symptoms? ---Yes  Will a triage be completed? ---Yes  Related visit to physician within the last 2 weeks? ---Yes  Does the PT have any chronic conditions? (i.e. diabetes, asthma, etc.) ---Yes  List chronic conditions. ---CHF, pulmonary insufficiency, COPD, wears O2,  Is this a behavioral health or substance abuse call? ---No     Guidelines    Guideline Title Affirmed Question Affirmed Notes  Rectal Bleeding Tarry or jet black-colored stool (not dark green)    Final Disposition User   Go to ED Now Harlow Mares, RN, Rhonda    Referrals  [facility] - ED   Disagree/Comply: Comply

## 2016-01-07 NOTE — Telephone Encounter (Signed)
Unable to reach pt by phone. Per [place] note pt to go to ED.

## 2016-01-07 NOTE — ED Notes (Signed)
No answer when pt's name called in the waiting room 

## 2016-01-08 ENCOUNTER — Encounter (HOSPITAL_COMMUNITY): Payer: Self-pay | Admitting: *Deleted

## 2016-01-08 ENCOUNTER — Observation Stay (HOSPITAL_COMMUNITY): Payer: 59

## 2016-01-08 DIAGNOSIS — D72829 Elevated white blood cell count, unspecified: Secondary | ICD-10-CM | POA: Insufficient documentation

## 2016-01-08 DIAGNOSIS — R0902 Hypoxemia: Secondary | ICD-10-CM

## 2016-01-08 DIAGNOSIS — J9610 Chronic respiratory failure, unspecified whether with hypoxia or hypercapnia: Secondary | ICD-10-CM

## 2016-01-08 DIAGNOSIS — A419 Sepsis, unspecified organism: Secondary | ICD-10-CM | POA: Insufficient documentation

## 2016-01-08 DIAGNOSIS — K5732 Diverticulitis of large intestine without perforation or abscess without bleeding: Secondary | ICD-10-CM

## 2016-01-08 DIAGNOSIS — K922 Gastrointestinal hemorrhage, unspecified: Secondary | ICD-10-CM | POA: Diagnosis present

## 2016-01-08 DIAGNOSIS — J9611 Chronic respiratory failure with hypoxia: Secondary | ICD-10-CM | POA: Diagnosis not present

## 2016-01-08 DIAGNOSIS — I5032 Chronic diastolic (congestive) heart failure: Secondary | ICD-10-CM | POA: Diagnosis not present

## 2016-01-08 DIAGNOSIS — L8990 Pressure ulcer of unspecified site, unspecified stage: Secondary | ICD-10-CM | POA: Diagnosis present

## 2016-01-08 LAB — CBC
HCT: 37.2 % (ref 36.0–46.0)
HCT: 36.4 % (ref 36.0–46.0)
Hemoglobin: 12.1 g/dL (ref 12.0–15.0)
Hemoglobin: 11.6 g/dL — ABNORMAL LOW (ref 12.0–15.0)
MCH: 30.1 pg (ref 26.0–34.0)
MCH: 29.9 pg (ref 26.0–34.0)
MCHC: 32.5 g/dL (ref 30.0–36.0)
MCHC: 31.9 g/dL (ref 30.0–36.0)
MCV: 92.5 fL (ref 78.0–100.0)
MCV: 93.8 fL (ref 78.0–100.0)
Platelets: 204 10*3/uL (ref 150–400)
Platelets: 209 10*3/uL (ref 150–400)
RBC: 4.02 MIL/uL (ref 3.87–5.11)
RBC: 3.88 MIL/uL (ref 3.87–5.11)
RDW: 14.5 % (ref 11.5–15.5)
RDW: 14.6 % (ref 11.5–15.5)
WBC: 10.4 10*3/uL (ref 4.0–10.5)
WBC: 9 10*3/uL (ref 4.0–10.5)

## 2016-01-08 LAB — HEMOGLOBIN AND HEMATOCRIT, BLOOD
HCT: 41.8 % (ref 36.0–46.0)
Hemoglobin: 13.5 g/dL (ref 12.0–15.0)

## 2016-01-08 LAB — BASIC METABOLIC PANEL
Anion gap: 7 (ref 5–15)
BUN: 20 mg/dL (ref 6–20)
CO2: 28 mmol/L (ref 22–32)
Calcium: 8.4 mg/dL — ABNORMAL LOW (ref 8.9–10.3)
Chloride: 105 mmol/L (ref 101–111)
Creatinine, Ser: 0.87 mg/dL (ref 0.44–1.00)
GFR calc Af Amer: >60 mL/min (ref >60)
GFR calc non Af Amer: >60 mL/min (ref >60)
Glucose, Bld: 92 mg/dL (ref 65–99)
Potassium: 3.7 mmol/L (ref 3.5–5.1)
Sodium: 140 mmol/L (ref 135–145)

## 2016-01-08 LAB — LACTIC ACID, PLASMA
Lactic Acid, Venous: 0.9 mmol/L (ref 0.5–2.0)
Lactic Acid, Venous: 1.1 mmol/L (ref 0.5–2.0)

## 2016-01-08 LAB — POC OCCULT BLOOD, ED: Fecal Occult Bld: POSITIVE — AB

## 2016-01-08 MED ORDER — SODIUM CHLORIDE 0.9% FLUSH
3.0000 mL | Freq: Two times a day (BID) | INTRAVENOUS | Status: DC
  Administered 2016-01-08 (×2): 3 mL via INTRAVENOUS

## 2016-01-08 MED ORDER — SODIUM CHLORIDE 0.9 % IV SOLN
8.0000 mg/h | INTRAVENOUS | Status: DC
  Administered 2016-01-08: 8 mg/h via INTRAVENOUS
  Filled 2016-01-08 (×2): qty 80

## 2016-01-08 MED ORDER — IPRATROPIUM BROMIDE 0.02 % IN SOLN: 0.5000 mg | RESPIRATORY_TRACT | Status: DC | PRN

## 2016-01-08 MED ORDER — ACETAMINOPHEN 650 MG RE SUPP
650.0000 mg | Freq: Four times a day (QID) | RECTAL | Status: DC | PRN
Start: 2016-01-08 — End: 2016-01-09

## 2016-01-08 MED ORDER — FUROSEMIDE 40 MG PO TABS
40.0000 mg | ORAL_TABLET | Freq: Two times a day (BID) | ORAL | Status: DC
  Administered 2016-01-08 – 2016-01-09 (×2): 40 mg via ORAL
  Filled 2016-01-08 (×2): qty 1

## 2016-01-08 MED ORDER — ASPIRIN 81 MG PO CHEW
81.0000 mg | CHEWABLE_TABLET | Freq: Every day | ORAL | Status: DC
  Administered 2016-01-08 – 2016-01-09 (×2): 81 mg via ORAL
  Filled 2016-01-08 (×2): qty 1

## 2016-01-08 MED ORDER — FLUTICASONE FUROATE-VILANTEROL 100-25 MCG/INH IN AEPB
1.0000 | INHALATION_SPRAY | Freq: Every day | RESPIRATORY_TRACT | Status: DC
  Administered 2016-01-08 – 2016-01-09 (×2): 1 via RESPIRATORY_TRACT
  Filled 2016-01-08: qty 28

## 2016-01-08 MED ORDER — SODIUM CHLORIDE 0.9 % IV SOLN
INTRAVENOUS | Status: DC
  Administered 2016-01-08: 04:00:00 via INTRAVENOUS

## 2016-01-08 MED ORDER — CIPROFLOXACIN IN D5W 400 MG/200ML IV SOLN
400.0000 mg | Freq: Two times a day (BID) | INTRAVENOUS | Status: DC
  Administered 2016-01-08 (×3): 400 mg via INTRAVENOUS
  Filled 2016-01-08 (×3): qty 200

## 2016-01-08 MED ORDER — ONDANSETRON HCL 4 MG PO TABS: 4.0000 mg | ORAL_TABLET | Freq: Four times a day (QID) | ORAL | Status: DC | PRN

## 2016-01-08 MED ORDER — DILTIAZEM HCL ER COATED BEADS 120 MG PO CP24
120.0000 mg | ORAL_CAPSULE | Freq: Every day | ORAL | Status: DC
  Administered 2016-01-08 – 2016-01-09 (×2): 120 mg via ORAL
  Filled 2016-01-08 (×2): qty 1

## 2016-01-08 MED ORDER — MONTELUKAST SODIUM 10 MG PO TABS
10.0000 mg | ORAL_TABLET | Freq: Every day | ORAL | Status: DC
  Administered 2016-01-08: 10 mg via ORAL
  Filled 2016-01-08: qty 1

## 2016-01-08 MED ORDER — POTASSIUM CHLORIDE CRYS ER 10 MEQ PO TBCR
10.0000 meq | EXTENDED_RELEASE_TABLET | Freq: Every day | ORAL | Status: DC
  Administered 2016-01-08 – 2016-01-09 (×2): 10 meq via ORAL
  Filled 2016-01-08 (×2): qty 1

## 2016-01-08 MED ORDER — METRONIDAZOLE IN NACL 5-0.79 MG/ML-% IV SOLN
500.0000 mg | Freq: Three times a day (TID) | INTRAVENOUS | Status: DC
  Administered 2016-01-08 – 2016-01-09 (×5): 500 mg via INTRAVENOUS
  Filled 2016-01-08 (×5): qty 100

## 2016-01-08 MED ORDER — ACETAMINOPHEN 325 MG PO TABS
650.0000 mg | ORAL_TABLET | Freq: Four times a day (QID) | ORAL | Status: DC | PRN
  Administered 2016-01-08: 650 mg via ORAL
  Filled 2016-01-08: qty 2

## 2016-01-08 MED ORDER — ONDANSETRON HCL 4 MG/2ML IJ SOLN: 4.0000 mg | Freq: Four times a day (QID) | INTRAMUSCULAR | Status: DC | PRN

## 2016-01-08 MED ORDER — PANTOPRAZOLE SODIUM 40 MG IV SOLR: 40.0000 mg | Freq: Two times a day (BID) | INTRAVENOUS | Status: DC

## 2016-01-08 MED ORDER — ALBUTEROL SULFATE (2.5 MG/3ML) 0.083% IN NEBU: 2.5000 mg | INHALATION_SOLUTION | RESPIRATORY_TRACT | Status: DC | PRN

## 2016-01-08 MED ORDER — SODIUM CHLORIDE 0.9 % IV SOLN
80.0000 mg | Freq: Once | INTRAVENOUS | Status: AC
  Administered 2016-01-08: 80 mg via INTRAVENOUS
  Filled 2016-01-08 (×2): qty 80

## 2016-01-08 NOTE — H&P (Addendum)
History and Physical    Deborah [name] ZDG:[id number] DOB: 1955/08/13 DOA: 01/07/2016  Referring MD/NP/PA: Dr. Randal Buba PCP: [name], MD  Patient coming from: Home  Chief Complaint:    HPI: Deborah [name] is a 60 y.o. female with medical history significant of COPD, chronic respiratory failure on 4 L of O2 24/7, diverticulosis anxiety/depression; who presents after having 4 black bowel movements earlier today. Patient states that when she wiped she had bright red blood on the toilet paper. Denies any recent fever, chills, nausea, vomiting, abdominal pain, worsening shortness of breath, chest pain, lightheadedness, or loss of consciousness. Patient apparently takes [facility] Goody powders at least 1-2 times per week may be more for back pain. Recently admitted to the hospital 6/2- 6/6 after being diagnosed with community-acquired pneumonia and COPD exacerbation. She currently was still on the prednisone taper down to taking 10 mg per day.   ED Course: Upon admission to the emergency department patient was seen to be afebrile, heart rates up to 119, respirations up to 26, blood pressures maintained, O2 sats maintained on patient's home 4 L of oxygen. Lab work revealed a WBC of 14.6, BUN of 25, creatinine 0.9. Patient was seen to have melena on physical exam that was Hemoccult positive. CT scan of the abdomen showed extensive colonic diverticulosis, mild focal thickening of diverticular wall and colonic wall in left lower quadrant at the junction of descending colon with sigmoid colon, mild stranding of pericolonic fat and possible early focal colitis. Patient was typed and screened for possible need of blood products. Patient was T&S for blood and started on Protonix drip per protocol. [facility] called to admit  Review of Systems: As per HPI otherwise 10 point review of systems negative.   Past Medical History  Diagnosis Date  . Umbilical hernia   . Asthma   . Anxiety   . Depression   . Shortness of  breath   . Pneumonia     hx of  . Arthritis   . Anemia     as teen  . PONV (postoperative nausea and vomiting)   . Family history of anesthesia complication     vomiting  . Histoplasmosis     left eye  . Hypertension   . Restrictive lung disease   . COPD, mild ([place])   . Heart failure ([place])     New onset 07/25/14  . Obesity (BMI 30-39.9)   . Tobacco abuse   . Hyperkalemia   . CHF (congestive heart failure) [facility])     Past Surgical History  Procedure Laterality Date  . Cesarean section  04/12/85  . Ventral hernia repair N/A 03/19/2013    Procedure:  OPEN VENTRAL HERNIA REPAIR WITH MESH AND APPLICATION OF WOUND VAC;  Surgeon: Madilyn Hook, DO;  Location: [facility];  Service: General;  Laterality: N/A;  . Insertion of mesh N/A 03/19/2013    Procedure: INSERTION OF MESH;  Surgeon: Madilyn Hook, DO;  Location: [facility];  Service: General;  Laterality: N/A;  . Application of wound vac  03/19/2013    Procedure: APPLICATION OF WOUND VAC;  Surgeon: Madilyn Hook, DO;  Location: [facility];  Service: General;;     reports that she quit smoking about 3 years ago. Her smoking use included Cigarettes. She has a 129 pack-year smoking history. She does not have any smokeless tobacco history on file. She reports that she drinks alcohol. She reports that she does not use illicit drugs.  No Known Allergies  Family  History  Problem Relation Age of Onset  . Heart attack Mother   . Emphysema Mother     was a smoker  . Lymphoma Maternal Uncle   . Pancreatic cancer Maternal Aunt   . Emphysema      Mother  . Heart failure      Father  . Cancer      Unknown    Prior to Admission medications   Medication Sig Start Date End Date Taking? Authorizing Provider  aspirin 81 MG chewable tablet Chew 1 tablet (81 mg total) by mouth daily. 10/05/14  Yes [name], MD  CARTIA XT 120 MG 24 hr capsule TAKE ONE CAPSULE BY MOUTH DAILY Patient taking differently: TAKE 120 MG BY MOUTH DAILY 12/09/15  Yes [name],  MD  Cholecalciferol (VITAMIN D-3) 1000 units CAPS Take 1,000 Units by mouth daily.   Yes Historical Provider, MD  fexofenadine (ALLEGRA) 30 MG tablet Take 30 mg by mouth every other day.   Yes Historical Provider, MD  fluticasone furoate-vilanterol (BREO ELLIPTA) 100-25 MCG/INH AEPB Inhale 1 puff into the lungs daily. 12/27/15  Yes [name], NP  furosemide (LASIX) 20 MG tablet Take 2 tablets by mouth every morning and 1 tablet by mouth every evening. 12/28/15  Yes Pleas Koch, NP  montelukast (SINGULAIR) 10 MG tablet TAKE 1 TABLET BY MOUTH EVERY NIGHT AT BEDTIME Patient taking differently: TAKE 10 MG BY MOUTH EVERY NIGHT AT BEDTIME 12/29/14  Yes [name], MD  potassium chloride SA (K-DUR,KLOR-CON) 20 MEQ tablet TAKE 1/2 TABLET(10 MEQ) BY MOUTH DAILY Patient taking differently: Take 10 mEq by mouth daily.  06/17/15  Yes [name], MD  predniSONE (DELTASONE) 5 MG tablet Label  & dispense according to the schedule below. 10 Pills PO for 3 days then, 8 Pills PO for 3 days, 6 Pills PO for 3 days, 4 Pills PO for 3 days, 2 Pills PO for 3 days, 1 Pills PO for 3 days, 1/2 Pill  PO for 3 days then STOP. Total 95 pills. 12/21/15  Yes Thurnell Lose, MD  pyridOXINE (VITAMIN B-6) 100 MG tablet Take 100 mg by mouth daily.   Yes Historical Provider, MD  vitamin B-12 (CYANOCOBALAMIN) 1000 MCG tablet Take 1,000 mcg by mouth daily.   Yes Historical Provider, MD  albuterol (PROVENTIL HFA;VENTOLIN HFA) 108 (90 Base) MCG/ACT inhaler Inhale 2 puffs into the lungs every 6 (six) hours as needed for wheezing or shortness of breath. 12/27/15   [name], NP  cefpodoxime (VANTIN) 200 MG tablet Take 1 tablet (200 mg total) by mouth 2 (two) times daily. Patient not taking: Reported on 01/07/2016 12/21/15   Thurnell Lose, MD    Physical Exam:   Constitutional: obese female in NAD, calm, comfortable Filed Vitals:   01/07/16 1924 01/07/16 2345  BP: 119/88 114/79  Pulse: 119 85  Temp: 97.7 F (36.5 C)    TempSrc: Oral   Resp: 18 26  SpO2: 94% 95%   Eyes: PERRL, lids and conjunctivae normal ENMT: Mucous membranes are moist. Posterior pharynx clear of any exudate or lesions.Normal dentition.  Neck: normal, supple, no masses, no thyromegaly Respiratory: Mildly Tachypneic , no wheezing, no crackles. Normal respiratory effort. No accessory muscle use.  Cardiovascular: Regular rate and rhythm, no murmurs / rubs / gallops. No extremity edema. 2+ pedal pulses. No carotid bruits.  Abdomen: no tenderness, no masses palpated. No hepatosplenomegaly. Bowel sounds positive.  Musculoskeletal: no clubbing / cyanosis. No  joint deformity upper and lower extremities. Good ROM, no contractures. Normal muscle tone.  Skin: no rashes, lesions, ulcers. No induration Neurologic: CN 2-12 grossly intact. Sensation intact, DTR normal. Strength 5/5 in all 4.  Psychiatric: Normal judgment and insight. Alert and oriented x 3. Normal mood.     Labs on Admission: I have personally reviewed following labs and imaging studies  CBC:  Recent Labs Lab 01/07/16 2008  WBC 14.6*  HGB 14.0  HCT 43.6  MCV 94.4  PLT 638   Basic Metabolic Panel:  Recent Labs Lab 01/07/16 2008  NA 141  K 3.7  CL 106  CO2 25  GLUCOSE 141*  BUN 25*  CREATININE 0.90  CALCIUM 8.8*   GFR: Estimated Creatinine Clearance: 80.2 mL/min (by C-G formula based on Cr of 0.9). Liver Function Tests:  Recent Labs Lab 01/07/16 2008  AST 20  ALT 28  ALKPHOS 73  BILITOT 0.5  PROT 6.7  ALBUMIN 3.3*   No results for input(s): LIPASE, AMYLASE in the last 168 hours. No results for input(s): AMMONIA in the last 168 hours. Coagulation Profile: No results for input(s): INR, PROTIME in the last 168 hours. Cardiac Enzymes: No results for input(s): CKTOTAL, CKMB, CKMBINDEX, TROPONINI in the last 168 hours. BNP (last 3 results) No results for input(s): PROBNP in the last 8760 hours. HbA1C: No results for input(s): HGBA1C in the last 72  hours. CBG: No results for input(s): GLUCAP in the last 168 hours. Lipid Profile: No results for input(s): CHOL, HDL, LDLCALC, TRIG, CHOLHDL, LDLDIRECT in the last 72 hours. Thyroid Function Tests: No results for input(s): TSH, T4TOTAL, FREET4, T3FREE, THYROIDAB in the last 72 hours. Anemia Panel: No results for input(s): VITAMINB12, FOLATE, FERRITIN, TIBC, IRON, RETICCTPCT in the last 72 hours. Urine analysis:    Component Value Date/Time   COLORURINE YELLOW 10/04/2010 2351   APPEARANCEUR CLEAR 10/04/2010 2351   LABSPEC 1.008 10/04/2010 2351   PHURINE 6.0 10/04/2010 2351   GLUCOSEU NEGATIVE 10/04/2010 2351   HGBUR NEGATIVE 10/04/2010 2351   BILIRUBINUR NEGATIVE 10/04/2010 2351   KETONESUR NEGATIVE 10/04/2010 2351   PROTEINUR NEGATIVE 10/04/2010 2351   UROBILINOGEN 0.2 10/04/2010 2351   NITRITE NEGATIVE 10/04/2010 2351   LEUKOCYTESUR  10/04/2010 2351    NEGATIVE MICROSCOPIC NOT DONE ON URINES WITH NEGATIVE PROTEIN, BLOOD, LEUKOCYTES, NITRITE, OR GLUCOSE <1000 mg/dL.   Sepsis Labs: No results found for this or any previous visit (from the past 240 hour(s)).   Radiological Exams on Admission: Ct Abdomen Pelvis W Contrast  01/07/2016  CLINICAL DATA:  Patient states she had 4 black bloody stools today EXAM: CT ABDOMEN AND PELVIS WITH CONTRAST TECHNIQUE: Multidetector CT imaging of the abdomen and pelvis was performed using the standard protocol following bolus administration of intravenous contrast. CONTRAST:  166m ISOVUE-300 IOPAMIDOL (ISOVUE-300) INJECTION 61% COMPARISON:  None. FINDINGS: Lower chest: Lung bases shows mild emphysematous changes. Small calcified granulomas are noted right base posteriorly. Hepatobiliary: Enhanced liver is unremarkable. No calcified gallstones are noted within gallbladder. Pancreas: Enhanced pancreas is unremarkable. Spleen: Punctate calcifications are noted within spleen from prior granulomatous disease. No focal splenic mass. Adrenals/Urinary Tract: No  adrenal gland mass. Enhanced kidneys are symmetrical in size. No hydronephrosis or hydroureter. Stomach/Bowel: There is no gastric outlet obstruction. No small bowel obstruction. No pericecal inflammation. Normal appendix is noted in axial image 41. Scattered colonic diverticula are noted right colon. Multiple colonic diverticula are noted transverse colon and descending colon. Multiple colonic diverticula are noted in sigmoid colon. In  axial image 62 there is focal thickening of colonic wall at the junction of descending colon with proximal sigmoid colon left lower quadrant. Minimal stranding of surrounding fat. Please see coronal image 64. Findings are highly suspicious for focal colitis or early diverticulitis. Tiny diverticular bleed cannot be excluded. Clinical correlation is necessary. There is no evidence of pericolonic abscess or hematoma. No extraluminal air is noted. No distal colonic obstruction. Vascular/Lymphatic: Atherosclerotic calcifications of abdominal aorta and iliac arteries are noted. No aortic aneurysm. Reproductive: The uterus is anteflexed normal size for age. No adnexal masses noted. Other: No ascites or free abdominal air. Musculoskeletal: No destructive bony lesions are noted. Mild degenerative changes lower thoracic and lumbar spine. IMPRESSION: 1. Extensive colonic diverticulosis noted. In axial image 62 there is mild focal thickening of diverticular wall and colonic wall in left lower quadrant at the junction of descending colon with sigmoid colon. Mild stranding of pericolonic fat. Focal colitis early diverticulitis or small diverticular bleed cannot be excluded. There is no evidence of pericolonic abscess or hematoma. No extraluminal air. 2. Normal appendix.  No pericecal inflammation. 3. No hydronephrosis or hydroureter. 4. No small bowel obstruction. Electronically Signed   By: Lahoma Crocker M.D.   On: 01/07/2016 22:56      Assessment/Plan Sepsis GI bleed/possible colitis with  severe diverticulosis:  Acute. Patient presented with complaints of having four dark stools today. Patient presents with a WBC of 14.6 with tachycardia and tachypnea. Stools Hemoccult positive. CT scan of the abdomen showed extensive colonic diverticulosis with mild focal thickening of diverticular wall and colonic wall in LLQ  at the junction of descending colon.  Also, visualized sigmoid colon with mild stranding of pericolonic fat, and question of early focal colitis. Would suspect that this was a diverticular bleed for patient to have having hematochezia and no melena. BUN was elevated at 25 to give concern for a more likely upper GI bleed. Patient had already received a half liter bolus with fluids. - Admit to telemetry bed - Sepsis protocol initiated - Check lactic acid - Protonix drip per protocol  - Ciprofloxacin & flagyl for possible signs of diverticulitis - NPO - T&S for possible need of blood products - Check EKG - H&H every 4 hours - Will need GI consultation in a.m.  Leukocytosis: WBC 14.6.Likely related to the above. - Repeat CBC in a.m.  COPD/chronic respiratory failure oxygen dependent: Stable - Continue nasal cannula oxygen 4 L  - Continue Breo  - DuoNeb's prn shortness of breath or wheezing  Essential hypertension - Continue Cardizem  Diastolic dysfunction : Last echocardiogram done in 07/2014 with EF of 50-55% and grade 1 diastolic dysfunction.  - Strict ins and out    DVT prophylaxis: SCD  Code Status:  Full Family Communication:  Discussed overall plan with the patient's husband present at bedside Disposition Plan: possible discharge home with hospice Consults called: None Admission status: Observation telemetry   Norval Morton MD [facility] Pager [phone]  If 7PM-7AM, please contact night-coverage [email or web address] Password [facility]  01/08/2016, 12:16 AM

## 2016-01-08 NOTE — Plan of Care (Signed)
Problem: Education: Goal: Knowledge of [facility] Education information/materials will improve Outcome: Completed/Met Date Met:  01/08/16 Education provided regarding diagnosis and treatment

## 2016-01-08 NOTE — Progress Notes (Signed)
Assumed care of patient. Agree with previous RN assessment. Patient has no complaints at this time.  Deborah [name]. Brigitte Pulse, RN

## 2016-01-08 NOTE — Plan of Care (Signed)
Problem: Safety: Goal: Ability to remain free from injury will improve Outcome: Completed/Met Date Met:  01/08/16 Patient is ambulatory in room with assist, gait is steady. Pt encouraged to call prior to getting out  Of the bed to adhere to safety plan.

## 2016-01-08 NOTE — Progress Notes (Signed)
[facility] PROGRESS NOTE    Progress Note  Deborah [name]  XFG:[id number] DOB: 12/28/1955 DOA: 01/07/2016 PCP: [name], MD     Brief Narrative:   Deborah [name] is an 60 y.o. female past medical history of COPD O2 dependent who comes in for bright red blood per rectum that started today of admission  Assessment/Plan:   Sepsis due Diverticulitis of colon I agree with IV Cipro and Flagyl, her leukocytosis is improved she has remained afebrile. Sepsis physiology has resolved. DC protonic unlikely an upper GI bleed her BUN/creatinine to creatinine ratio is less than 2. She has only taken BC powders 2-3 times in the last week  Hemoglobin has remained stable, a mild drop in her hemoglobin compared from lab work on 12/19/2015.  Chronic respiratory failure (HCC) oxygen dependent: On home regimen no wheezing on physical exam. PMT to follow-up as an outpatient.  Chronic diastolic heart failure ([place]): Euvolemic DC IV fluids  Essential hypertension: Resume current home regimen.  Pressure ulcer:  DVT prophylaxis: SCD Family Communication:none Disposition Plan/Barrier to D/C: home in am Code Status:     Code Status Orders        Start     Ordered   01/08/16 0057  Full code   Continuous     01/08/16 0059    Code Status History    Date Active Date Inactive Code Status Order ID Comments User Context   12/18/2015  1:36 AM 12/21/2015  3:18 PM Full Code [id number]  Etta Quill, DO ED   07/25/2014  6:46 PM 08/04/2014  7:57 PM Full Code [id number]  Nita Sells, MD Inpatient        IV Access:    Peripheral IV   Procedures and diagnostic studies:   Ct Abdomen Pelvis W Contrast  01/07/2016  CLINICAL DATA:  Patient states she had 4 black bloody stools today EXAM: CT ABDOMEN AND PELVIS WITH CONTRAST TECHNIQUE: Multidetector CT imaging of the abdomen and pelvis was performed using the standard protocol following bolus administration of intravenous contrast.  CONTRAST:  159m ISOVUE-300 IOPAMIDOL (ISOVUE-300) INJECTION 61% COMPARISON:  None. FINDINGS: Lower chest: Lung bases shows mild emphysematous changes. Small calcified granulomas are noted right base posteriorly. Hepatobiliary: Enhanced liver is unremarkable. No calcified gallstones are noted within gallbladder. Pancreas: Enhanced pancreas is unremarkable. Spleen: Punctate calcifications are noted within spleen from prior granulomatous disease. No focal splenic mass. Adrenals/Urinary Tract: No adrenal gland mass. Enhanced kidneys are symmetrical in size. No hydronephrosis or hydroureter. Stomach/Bowel: There is no gastric outlet obstruction. No small bowel obstruction. No pericecal inflammation. Normal appendix is noted in axial image 41. Scattered colonic diverticula are noted right colon. Multiple colonic diverticula are noted transverse colon and descending colon. Multiple colonic diverticula are noted in sigmoid colon. In axial image 62 there is focal thickening of colonic wall at the junction of descending colon with proximal sigmoid colon left lower quadrant. Minimal stranding of surrounding fat. Please see coronal image 64. Findings are highly suspicious for focal colitis or early diverticulitis. Tiny diverticular bleed cannot be excluded. Clinical correlation is necessary. There is no evidence of pericolonic abscess or hematoma. No extraluminal air is noted. No distal colonic obstruction. Vascular/Lymphatic: Atherosclerotic calcifications of abdominal aorta and iliac arteries are noted. No aortic aneurysm. Reproductive: The uterus is anteflexed normal size for age. No adnexal masses noted. Other: No ascites or free abdominal air. Musculoskeletal: No destructive bony lesions are noted. Mild degenerative changes lower thoracic and lumbar spine. IMPRESSION: 1. Extensive  colonic diverticulosis noted. In axial image 62 there is mild focal thickening of diverticular wall and colonic wall in left lower quadrant at  the junction of descending colon with sigmoid colon. Mild stranding of pericolonic fat. Focal colitis early diverticulitis or small diverticular bleed cannot be excluded. There is no evidence of pericolonic abscess or hematoma. No extraluminal air. 2. Normal appendix.  No pericecal inflammation. 3. No hydronephrosis or hydroureter. 4. No small bowel obstruction. Electronically Signed   By: Lahoma Crocker M.D.   On: 01/07/2016 22:56   Dg Chest Port 1 View  01/08/2016  CLINICAL DATA:  Acute onset of bloody stools and shortness of breath. Initial encounter. EXAM: PORTABLE CHEST 1 VIEW COMPARISON:  Chest radiograph performed 12/27/2015 FINDINGS: The lungs are well-aerated and clear. There is no evidence of focal opacification, pleural effusion or pneumothorax. The cardiomediastinal silhouette is borderline enlarged. No acute osseous abnormalities are seen. IMPRESSION: Borderline cardiomegaly.  Lungs remain grossly clear. Electronically Signed   By: Garald Balding M.D.   On: 01/08/2016 02:14     Medical Consultants:    None.  Anti-Infectives:   Cipro Flagyl started on admission  Subjective:    Deborah [name] she relates she feels great no abdominal pain hungry, ready go home.  Objective:    Filed Vitals:   01/08/16 0000 01/08/16 0312 01/08/16 0609 01/08/16 0834  BP: 102/67 118/71 102/82   Pulse: 87 79 83   Temp:  97.8 F (36.6 C) 97.7 F (36.5 C)   TempSrc:  Oral Oral   Resp: '24 18 18   '$ Height:  '5\' 4"'$  (1.626 m)    Weight:  101.515 kg (223 lb 12.8 oz)    SpO2: 94% 98% 99% 97%    Intake/Output Summary (Last 24 hours) at 01/08/16 0957 Last data filed at 01/08/16 0622  Gross per 24 hour  Intake  670.5 ml  Output    700 ml  Net  -29.5 ml   Filed Weights   01/08/16 0312  Weight: 101.515 kg (223 lb 12.8 oz)    Exam: General exam: In no acute distress. Respiratory system: Good air movement and clear to auscultation. Cardiovascular system: S1 & S2 heard, RRR.  Gastrointestinal  system: Abdomen is nondistended, soft and nontender.  Central nervous system: Alert and oriented. No focal neurological deficits. Extremities: No pedal edema. Skin: No rashes, lesions or ulcers Psychiatry: Judgement and insight appear normal. Mood & affect appropriate.    Data Reviewed:    Labs: Basic Metabolic Panel:  Recent Labs Lab 01/07/16 2008 01/08/16 0453  NA 141 140  K 3.7 3.7  CL 106 105  CO2 25 28  GLUCOSE 141* 92  BUN 25* 20  CREATININE 0.90 0.87  CALCIUM 8.8* 8.4*   GFR Estimated Creatinine Clearance: 82.7 mL/min (by C-G formula based on Cr of 0.87). Liver Function Tests:  Recent Labs Lab 01/07/16 2008  AST 20  ALT 28  ALKPHOS 73  BILITOT 0.5  PROT 6.7  ALBUMIN 3.3*   No results for input(s): LIPASE, AMYLASE in the last 168 hours. No results for input(s): AMMONIA in the last 168 hours. Coagulation profile No results for input(s): INR, PROTIME in the last 168 hours.  CBC:  Recent Labs Lab 01/07/16 2008 01/08/16 0118 01/08/16 0453  WBC 14.6*  --  10.4  HGB 14.0 13.5 12.1  HCT 43.6 41.8 37.2  MCV 94.4  --  92.5  PLT 225  --  204   Cardiac Enzymes: No results for input(s):  CKTOTAL, CKMB, CKMBINDEX, TROPONINI in the last 168 hours. BNP (last 3 results) No results for input(s): PROBNP in the last 8760 hours. CBG: No results for input(s): GLUCAP in the last 168 hours. D-Dimer: No results for input(s): DDIMER in the last 72 hours. Hgb A1c: No results for input(s): HGBA1C in the last 72 hours. Lipid Profile: No results for input(s): CHOL, HDL, LDLCALC, TRIG, CHOLHDL, LDLDIRECT in the last 72 hours. Thyroid function studies: No results for input(s): TSH, T4TOTAL, T3FREE, THYROIDAB in the last 72 hours.  Invalid input(s): FREET3 Anemia work up: No results for input(s): VITAMINB12, FOLATE, FERRITIN, TIBC, IRON, RETICCTPCT in the last 72 hours. Sepsis Labs:  Recent Labs Lab 01/07/16 2008 01/08/16 0236 01/08/16 0453 01/08/16 0454  WBC  14.6*  --  10.4  --   LATICACIDVEN  --  0.9  --  1.1   Microbiology No results found for this or any previous visit (from the past 240 hour(s)).   Medications:   . ciprofloxacin  400 mg Intravenous BID  . diltiazem  120 mg Oral Daily  . fluticasone furoate-vilanterol  1 puff Inhalation Daily  . metronidazole  500 mg Intravenous Q8H  . [START ON 01/11/2016] pantoprazole  40 mg Intravenous Q12H  . sodium chloride flush  3 mL Intravenous Q12H   Continuous Infusions: . sodium chloride 100 mL/hr at 01/08/16 0339  . pantoprozole (PROTONIX) infusion 8 mg/hr (01/08/16 0340)    Time spent: 25 min     FELIZ Marguarite Arbour  [facility] Pager [phone]  *Please refer to [email or web address], password [email or web address] to get updated schedule on who will round on this patient, as hospitalists switch teams weekly. If 7PM-7AM, please contact night-coverage at [email or web address], password [email or web address] for any overnight needs.  01/08/2016, 9:57 AM

## 2016-01-09 DIAGNOSIS — I5032 Chronic diastolic (congestive) heart failure: Secondary | ICD-10-CM | POA: Diagnosis not present

## 2016-01-09 DIAGNOSIS — J9611 Chronic respiratory failure with hypoxia: Secondary | ICD-10-CM | POA: Diagnosis not present

## 2016-01-09 DIAGNOSIS — K5732 Diverticulitis of large intestine without perforation or abscess without bleeding: Secondary | ICD-10-CM

## 2016-01-09 LAB — CBC
HCT: 37.9 % (ref 36.0–46.0)
Hemoglobin: 12.2 g/dL (ref 12.0–15.0)
MCH: 30.6 pg (ref 26.0–34.0)
MCHC: 32.2 g/dL (ref 30.0–36.0)
MCV: 95 fL (ref 78.0–100.0)
Platelets: 184 10*3/uL (ref 150–400)
RBC: 3.99 MIL/uL (ref 3.87–5.11)
RDW: 14.5 % (ref 11.5–15.5)
WBC: 8.4 10*3/uL (ref 4.0–10.5)

## 2016-01-09 MED ORDER — CIPROFLOXACIN HCL 500 MG PO TABS: 500.0000 mg | ORAL_TABLET | Freq: Two times a day (BID) | ORAL | Status: DC

## 2016-01-09 MED ORDER — METRONIDAZOLE 500 MG PO TABS: 500.0000 mg | ORAL_TABLET | Freq: Three times a day (TID) | ORAL | Status: DC

## 2016-01-09 NOTE — Discharge Summary (Addendum)
Physician Discharge Summary  Deborah [name] QQP:[id number] DOB: 1956/04/03 DOA: 01/07/2016  PCP: [name], MD  Admit date: 01/07/2016 Discharge date: 01/09/2016  Time spent: 35 minutes  Recommendations for Outpatient Follow-up:  1. Follow-up with primary care doctor in 2-4 weeks. She will need to be referred for a colonoscopy as an outpatient if she has not had one.   Discharge Diagnoses:  Principal Problem:   Diverticulitis of colon Active Problems:   Chronic respiratory failure (HCC)   Chronic diastolic heart failure (HCC)   GI bleeding   Pressure ulcer   Discharge Condition: stable  Diet recommendation: Heart healthy  Filed Weights   01/08/16 0312  Weight: 101.515 kg (223 lb 12.8 oz)    History of present illness:  60 year old female with past medical history of COPD oxygen dependent, comes in to the ED as she went to the bathroom and saw bright red blood.  Hospital Course:  Sepsis due to colonic diverticulitis: He was admitted to the hospital start on IV ciprofloxacin and Flagyl her leukocytosis resolved, and her sepsis as etiology resolved within 24 hours. CT scan of the abdomen and pelvis was done with results as below was she was to tolerate oral she was changed to ciprofloxacin and Flagyl orally which she would take for 10 days. Follow-up with her PCP which will refer her for colonoscopy as an outpatient. Her hematochezia is probably due to diverticulosis.  Chronic respiratory failure oxygen dependent: Remained stable and no changes were made.  Chronic diastolic heart failure: Remains euvolemic no changes were made.  Essential hypertension: No change made to her medication.  Procedures:  CT scan of the abdomen and pelvis  Consultations:  none  Discharge Exam: Filed Vitals:   01/08/16 2035 01/09/16 0434  BP: 125/76 126/87  Pulse: 79 78  Temp: 97.7 F (36.5 C) 97.7 F (36.5 C)  Resp: 18 18    General: A&O x3 Cardiovascular:  RRR Respiratory: good air movement CTA B/L  Discharge Instructions   Discharge Instructions    Diet - low sodium heart healthy    Complete by:  As directed      Increase activity slowly    Complete by:  As directed           Current Discharge Medication List    START taking these medications   Details  ciprofloxacin (CIPRO) 500 MG tablet Take 1 tablet (500 mg total) by mouth 2 (two) times daily. Qty: 20 tablet, Refills: 0    metroNIDAZOLE (FLAGYL) 500 MG tablet Take 1 tablet (500 mg total) by mouth 3 (three) times daily. Qty: 24 tablet, Refills: 0      CONTINUE these medications which have NOT CHANGED   Details  aspirin 81 MG chewable tablet Chew 1 tablet (81 mg total) by mouth daily. Qty: 30 tablet, Refills: 1    CARTIA XT 120 MG 24 hr capsule TAKE ONE CAPSULE BY MOUTH DAILY Qty: 30 capsule, Refills: 3    fexofenadine (ALLEGRA) 30 MG tablet Take 30 mg by mouth every other day.    fluticasone furoate-vilanterol (BREO ELLIPTA) 100-25 MCG/INH AEPB Inhale 1 puff into the lungs daily. Qty: 1 each, Refills: 5    furosemide (LASIX) 20 MG tablet Take 2 tablets by mouth every morning and 1 tablet by mouth every evening. Qty: 45 tablet, Refills: 1   Associated Diagnoses: Bilateral edema of lower extremity    montelukast (SINGULAIR) 10 MG tablet TAKE 1 TABLET BY MOUTH EVERY NIGHT AT BEDTIME Qty: 90 tablet,  Refills: 0    potassium chloride SA (K-DUR,KLOR-CON) 20 MEQ tablet TAKE 1/2 TABLET(10 MEQ) BY MOUTH DAILY Qty: 45 tablet, Refills: 3    predniSONE (DELTASONE) 5 MG tablet Label  & dispense according to the schedule below. 10 Pills PO for 3 days then, 8 Pills PO for 3 days, 6 Pills PO for 3 days, 4 Pills PO for 3 days, 2 Pills PO for 3 days, 1 Pills PO for 3 days, 1/2 Pill  PO for 3 days then STOP. Total 95 pills. Qty: 95 tablet, Refills: 0    pyridOXINE (VITAMIN B-6) 100 MG tablet Take 100 mg by mouth daily.    vitamin B-12 (CYANOCOBALAMIN) 1000 MCG tablet Take 1,000 mcg  by mouth daily.    albuterol (PROVENTIL HFA;VENTOLIN HFA) 108 (90 Base) MCG/ACT inhaler Inhale 2 puffs into the lungs every 6 (six) hours as needed for wheezing or shortness of breath. Qty: 1 Inhaler, Refills: 2      STOP taking these medications     Cholecalciferol (VITAMIN D-3) 1000 units CAPS      cefpodoxime (VANTIN) 200 MG tablet        No Known Allergies Follow-up Information    Follow up with [name], MD In 2 weeks.   Specialty:  Family Medicine   Why:  hospital follow up   Contact information:   [address] [place] [address] [phone]        The results of significant diagnostics from this hospitalization (including imaging, microbiology, ancillary and laboratory) are listed below for reference.    Significant Diagnostic Studies: Dg Chest 2 View  12/27/2015  CLINICAL DATA:  New history of pneumonia. EXAM: CHEST  2 VIEW COMPARISON:  None. FINDINGS: The lungs are clear. Heart size is normal. No pneumothorax or pleural effusion. IMPRESSION: No acute disease. Electronically Signed   By: Inge Rise M.D.   On: 12/27/2015 13:17   Ct Abdomen Pelvis W Contrast  01/07/2016  CLINICAL DATA:  Patient states she had 4 black bloody stools today EXAM: CT ABDOMEN AND PELVIS WITH CONTRAST TECHNIQUE: Multidetector CT imaging of the abdomen and pelvis was performed using the standard protocol following bolus administration of intravenous contrast. CONTRAST:  135m ISOVUE-300 IOPAMIDOL (ISOVUE-300) INJECTION 61% COMPARISON:  None. FINDINGS: Lower chest: Lung bases shows mild emphysematous changes. Small calcified granulomas are noted right base posteriorly. Hepatobiliary: Enhanced liver is unremarkable. No calcified gallstones are noted within gallbladder. Pancreas: Enhanced pancreas is unremarkable. Spleen: Punctate calcifications are noted within spleen from prior granulomatous disease. No focal splenic mass. Adrenals/Urinary Tract: No adrenal gland mass. Enhanced kidneys  are symmetrical in size. No hydronephrosis or hydroureter. Stomach/Bowel: There is no gastric outlet obstruction. No small bowel obstruction. No pericecal inflammation. Normal appendix is noted in axial image 41. Scattered colonic diverticula are noted right colon. Multiple colonic diverticula are noted transverse colon and descending colon. Multiple colonic diverticula are noted in sigmoid colon. In axial image 62 there is focal thickening of colonic wall at the junction of descending colon with proximal sigmoid colon left lower quadrant. Minimal stranding of surrounding fat. Please see coronal image 64. Findings are highly suspicious for focal colitis or early diverticulitis. Tiny diverticular bleed cannot be excluded. Clinical correlation is necessary. There is no evidence of pericolonic abscess or hematoma. No extraluminal air is noted. No distal colonic obstruction. Vascular/Lymphatic: Atherosclerotic calcifications of abdominal aorta and iliac arteries are noted. No aortic aneurysm. Reproductive: The uterus is anteflexed normal size for age. No adnexal masses noted.  Other: No ascites or free abdominal air. Musculoskeletal: No destructive bony lesions are noted. Mild degenerative changes lower thoracic and lumbar spine. IMPRESSION: 1. Extensive colonic diverticulosis noted. In axial image 62 there is mild focal thickening of diverticular wall and colonic wall in left lower quadrant at the junction of descending colon with sigmoid colon. Mild stranding of pericolonic fat. Focal colitis early diverticulitis or small diverticular bleed cannot be excluded. There is no evidence of pericolonic abscess or hematoma. No extraluminal air. 2. Normal appendix.  No pericecal inflammation. 3. No hydronephrosis or hydroureter. 4. No small bowel obstruction. Electronically Signed   By: Lahoma Crocker M.D.   On: 01/07/2016 22:56   Dg Chest Port 1 View  01/08/2016  CLINICAL DATA:  Acute onset of bloody stools and shortness of  breath. Initial encounter. EXAM: PORTABLE CHEST 1 VIEW COMPARISON:  Chest radiograph performed 12/27/2015 FINDINGS: The lungs are well-aerated and clear. There is no evidence of focal opacification, pleural effusion or pneumothorax. The cardiomediastinal silhouette is borderline enlarged. No acute osseous abnormalities are seen. IMPRESSION: Borderline cardiomegaly.  Lungs remain grossly clear. Electronically Signed   By: Garald Balding M.D.   On: 01/08/2016 02:14   Dg Chest Port 1 View  12/19/2015  CLINICAL DATA:  Respiratory failure.  COPD. EXAM: PORTABLE CHEST 1 VIEW COMPARISON:  12/18/2015 FINDINGS: Mild cardiomegaly and vascular congestion. Left basilar airspace opacity. Right lung is clear. No effusions or acute bony abnormality. IMPRESSION: Cardiomegaly, vascular congestion. Left base atelectasis or infiltrate. Electronically Signed   By: Rolm Baptise M.D.   On: 12/19/2015 07:10   Dg Chest Port 1 View  12/18/2015  CLINICAL DATA:  Decreased oxygen saturations EXAM: PORTABLE CHEST 1 VIEW COMPARISON:  12/18/2015 FINDINGS: Normal heart size. There is diminished aeration to the left lung base. Opacity noted within the right infrahilar region appears improved from previous exam. Aortic atherosclerosis noted. IMPRESSION: 1. Left base opacity which may represent pneumonia or atelectasis. Electronically Signed   By: Kerby Moors M.D.   On: 12/18/2015 07:25   Dg Chest Port 1 View  12/18/2015  CLINICAL DATA:  Shortness of breath EXAM: PORTABLE CHEST 1 VIEW COMPARISON:  07/28/2014 FINDINGS: Bronchitic markings and hyperinflation. Streaky right infrahilar opacity. No edema, effusion, or pneumothorax. No suspected cardiomegaly when accounting for mediastinal fat pad. IMPRESSION: 1. Right infrahilar atelectasis or pneumonia. 2. COPD. Electronically Signed   By: Monte Fantasia M.D.   On: 12/18/2015 00:24    Microbiology: No results found for this or any previous visit (from the past 240 hour(s)).   Labs: Basic  Metabolic Panel:  Recent Labs Lab 01/07/16 2008 01/08/16 0453  NA 141 140  K 3.7 3.7  CL 106 105  CO2 25 28  GLUCOSE 141* 92  BUN 25* 20  CREATININE 0.90 0.87  CALCIUM 8.8* 8.4*   Liver Function Tests:  Recent Labs Lab 01/07/16 2008  AST 20  ALT 28  ALKPHOS 73  BILITOT 0.5  PROT 6.7  ALBUMIN 3.3*   No results for input(s): LIPASE, AMYLASE in the last 168 hours. No results for input(s): AMMONIA in the last 168 hours. CBC:  Recent Labs Lab 01/07/16 2008 01/08/16 0118 01/08/16 0453 01/08/16 1800 01/09/16 0552  WBC 14.6*  --  10.4 9.0 8.4  HGB 14.0 13.5 12.1 11.6* 12.2  HCT 43.6 41.8 37.2 36.4 37.9  MCV 94.4  --  92.5 93.8 95.0  PLT 225  --  204 209 184   Cardiac Enzymes: No results for input(s): CKTOTAL,  CKMB, CKMBINDEX, TROPONINI in the last 168 hours. BNP: BNP (last 3 results)  Recent Labs  12/18/15 0003  BNP 166.0*    ProBNP (last 3 results) No results for input(s): PROBNP in the last 8760 hours.  CBG: No results for input(s): GLUCAP in the last 168 hours.     Signed:  Charlynne Cousins MD.  [facility] 01/09/2016, 10:29 AM

## 2016-01-09 NOTE — Progress Notes (Signed)
Patient discharged home with family, discharge instructions given and explained to patient/family and they verbalized understanding, denies any pain/distress, accompanied home by daughter. No wound noted, skin intact.

## 2016-01-10 ENCOUNTER — Telehealth: Payer: Self-pay

## 2016-01-10 ENCOUNTER — Telehealth: Payer: Self-pay | Admitting: Pulmonary Disease

## 2016-01-10 NOTE — Telephone Encounter (Signed)
Patient calling stating that she received a call from [facility] and they are requesting an order from our office before they can get her a new mask. Rodena Piety, do you have a CMN on this patient? Please advise.

## 2016-01-10 NOTE — Telephone Encounter (Signed)
I do not have any forms for this patient not for Deborah [name] or Dr. Elsworth Soho to sign. I have sent a staff message to [facility] asking her to check on the order for Mask from 06/12

## 2016-01-10 NOTE — Telephone Encounter (Signed)
1st attempt to reach patient for TCM.   Transition Care Management Follow-up Telephone Call     Date discharged? 01/09/2016        How have you been since you were released from the hospital? Health status improving   Any patient concerns? No   Do you understand why you were in the hospital? Yes   Do you understand the discharge instructions? Yes   Where were you discharged to? Home   Items Reviewed:  Medications reviewed: Yes  Allergies reviewed: Yes  Dietary changes reviewed: Yes  Referrals reviewed: N/A   Functional Questionnaire:  Independent - I Dependent - D    Activities of Daily Living (ADLs):  Pt is independent with all ADLs.   Personal hygiene  Dressing Eating  Maintaining continence Transferring  Independent Activities of Daily Living (ADLs): Pt is independent with all iADLs.   Basic Corporate treasurer Housework  Managing medications Managing personal finances   Confirmed importance and date/time of follow-up visits scheduled YES  Provider Appointment booked with PCP 01/20/16 @ 1000  Confirmed with patient if condition begins to worsen call PCP or go to the ER.  Patient was given the office number and encouraged to call back with question or concerns: YES

## 2016-01-13 LAB — CULTURE, BLOOD (ROUTINE X 2)
Culture: NO GROWTH
Culture: NO GROWTH

## 2016-01-13 NOTE — Telephone Encounter (Signed)
LVM for Deborah [name] with [facility] to return our call.

## 2016-01-14 NOTE — Telephone Encounter (Signed)
Deborah [name] did Deborah [name] ever message you about about this pt? Thanks.

## 2016-01-14 NOTE — Telephone Encounter (Signed)
Spoke with Melissa @ [facility]. She states that they are working on getting pt CPAP supplies. They did not have her sleep study and OV notes in their system. Reviewed docs available in [place]. Melissa will pull them and work on order. Nothing further needed at this time.

## 2016-01-17 ENCOUNTER — Other Ambulatory Visit: Payer: Self-pay | Admitting: Pulmonary Disease

## 2016-01-17 DIAGNOSIS — G4733 Obstructive sleep apnea (adult) (pediatric): Secondary | ICD-10-CM

## 2016-01-17 NOTE — Progress Notes (Signed)
Per Melissa @ [facility] pt needs CPAP order. Order was never placed and pt has been using hospital discharge CPAP unit since 08/2014. They need to replace that temporary unit with pt's own unit. New CPAP order placed.

## 2016-01-20 ENCOUNTER — Encounter: Payer: Self-pay | Admitting: Internal Medicine

## 2016-01-20 ENCOUNTER — Encounter: Payer: Self-pay | Admitting: Family Medicine

## 2016-01-20 ENCOUNTER — Ambulatory Visit (INDEPENDENT_AMBULATORY_CARE_PROVIDER_SITE_OTHER): Payer: 59 | Admitting: Family Medicine

## 2016-01-20 ENCOUNTER — Telehealth: Payer: Self-pay | Admitting: Pulmonary Disease

## 2016-01-20 VITALS — BP 112/66 | HR 88 | Temp 97.6°F | Wt 229.8 lb

## 2016-01-20 DIAGNOSIS — K922 Gastrointestinal hemorrhage, unspecified: Secondary | ICD-10-CM

## 2016-01-20 DIAGNOSIS — D72829 Elevated white blood cell count, unspecified: Secondary | ICD-10-CM | POA: Diagnosis not present

## 2016-01-20 DIAGNOSIS — K5732 Diverticulitis of large intestine without perforation or abscess without bleeding: Secondary | ICD-10-CM | POA: Diagnosis not present

## 2016-01-20 DIAGNOSIS — Z5189 Encounter for other specified aftercare: Secondary | ICD-10-CM

## 2016-01-20 DIAGNOSIS — A419 Sepsis, unspecified organism: Secondary | ICD-10-CM

## 2016-01-20 DIAGNOSIS — G4733 Obstructive sleep apnea (adult) (pediatric): Secondary | ICD-10-CM

## 2016-01-20 LAB — CBC WITH DIFFERENTIAL/PLATELET
Basophils Absolute: 0.1 10*3/uL (ref 0.0–0.1)
Basophils Relative: 0.5 % (ref 0.0–3.0)
Eosinophils Absolute: 0.2 10*3/uL (ref 0.0–0.7)
Eosinophils Relative: 1.7 % (ref 0.0–5.0)
HCT: 39.6 % (ref 36.0–46.0)
Hemoglobin: 13.1 g/dL (ref 12.0–15.0)
Lymphocytes Relative: 23.3 % (ref 12.0–46.0)
Lymphs Abs: 2.3 10*3/uL (ref 0.7–4.0)
MCHC: 33 g/dL (ref 30.0–36.0)
MCV: 90.8 fl (ref 78.0–100.0)
Monocytes Absolute: 0.8 10*3/uL (ref 0.1–1.0)
Monocytes Relative: 8 % (ref 3.0–12.0)
Neutro Abs: 6.7 10*3/uL (ref 1.4–7.7)
Neutrophils Relative %: 66.5 % (ref 43.0–77.0)
Platelets: 270 10*3/uL (ref 150.0–400.0)
RBC: 4.36 Mil/uL (ref 3.87–5.11)
RDW: 15.5 % (ref 11.5–15.5)
WBC: 10.1 10*3/uL (ref 4.0–10.5)

## 2016-01-20 NOTE — Progress Notes (Signed)
Pre visit review using our clinic review tool, if applicable. No additional management support is needed unless otherwise documented below in the visit note. 

## 2016-01-20 NOTE — Telephone Encounter (Signed)
Message sent to [facility] regarding Dr. Bari Mantis response to Staff Message. Order entered for Supplies only, not new CPAP.

## 2016-01-20 NOTE — Assessment & Plan Note (Signed)
Symptoms resolved s/p cipro and flagyl. Refer to GI for colonoscopy.

## 2016-01-20 NOTE — Assessment & Plan Note (Signed)
Resolved.  Presumed secondary to diverticular bleed.

## 2016-01-20 NOTE — Telephone Encounter (Signed)
From: Darlina Guys    Sent: 01/17/2016  10:19 AM      To: Glean Hess, CMA, Melburn Hake Cox, CMA  Good morning ladies! We are working on getting this pt a permanent CPAP in exchange for his hospital discharge unit.  We are needing a full CPAP prescription with settings.  Can you assist in getting that please?  Thank you! Melissa    No new Rx required - no new unit Non compliant pt on prior download AHI 6.8 OK to provide new supplies for existing machine

## 2016-01-20 NOTE — Patient Instructions (Signed)
Good to see you. Please stop by to see Deborah [name] on your way out. 

## 2016-01-20 NOTE — Assessment & Plan Note (Signed)
Secondary to diverticulitis. WBC trending down. Repeat CBC today.

## 2016-01-20 NOTE — Progress Notes (Signed)
Subjective:   Patient ID: Deborah [name], female    DOB: [birth year], 58 y.o.   MRN: [id number]  Deborah [name] is a pleasant 60 y.o. year old female who presents to clinic today with Hospitalization Follow-up  on 01/20/2016  HPI:  Admitted 01/07/16-01/09/16 Notes reviewed.  Presented to ER with complaint of bloody stools.  CT of abd/pelvis- confirmed colonic diverticulitis. Admitted and started on IV cipro and flagyl for sepsis. As WBC trended down and she could tolerate oral, she was changed to oral cipro and flagyl.  Finished antibiotics yesterday.  Has had no further rectal bleeding.  Never had fever, nausea or abdominal pain.  Lab Results  Component Value Date   WBC 8.4 01/09/2016   HGB 12.2 01/09/2016   HCT 37.9 01/09/2016   MCV 95.0 01/09/2016   PLT 184 01/09/2016     Dg Chest 2 View  12/27/2015  CLINICAL DATA:  New history of pneumonia. EXAM: CHEST  2 VIEW COMPARISON:  None. FINDINGS: The lungs are clear. Heart size is normal. No pneumothorax or pleural effusion. IMPRESSION: No acute disease. Electronically Signed   By: Inge Rise M.D.   On: 12/27/2015 13:17   Ct Abdomen Pelvis W Contrast  01/07/2016  CLINICAL DATA:  Patient states she had 4 black bloody stools today EXAM: CT ABDOMEN AND PELVIS WITH CONTRAST TECHNIQUE: Multidetector CT imaging of the abdomen and pelvis was performed using the standard protocol following bolus administration of intravenous contrast. CONTRAST:  143m ISOVUE-300 IOPAMIDOL (ISOVUE-300) INJECTION 61% COMPARISON:  None. FINDINGS: Lower chest: Lung bases shows mild emphysematous changes. Small calcified granulomas are noted right base posteriorly. Hepatobiliary: Enhanced liver is unremarkable. No calcified gallstones are noted within gallbladder. Pancreas: Enhanced pancreas is unremarkable. Spleen: Punctate calcifications are noted within spleen from prior granulomatous disease. No focal splenic mass. Adrenals/Urinary Tract: No adrenal gland  mass. Enhanced kidneys are symmetrical in size. No hydronephrosis or hydroureter. Stomach/Bowel: There is no gastric outlet obstruction. No small bowel obstruction. No pericecal inflammation. Normal appendix is noted in axial image 41. Scattered colonic diverticula are noted right colon. Multiple colonic diverticula are noted transverse colon and descending colon. Multiple colonic diverticula are noted in sigmoid colon. In axial image 62 there is focal thickening of colonic wall at the junction of descending colon with proximal sigmoid colon left lower quadrant. Minimal stranding of surrounding fat. Please see coronal image 64. Findings are highly suspicious for focal colitis or early diverticulitis. Tiny diverticular bleed cannot be excluded. Clinical correlation is necessary. There is no evidence of pericolonic abscess or hematoma. No extraluminal air is noted. No distal colonic obstruction. Vascular/Lymphatic: Atherosclerotic calcifications of abdominal aorta and iliac arteries are noted. No aortic aneurysm. Reproductive: The uterus is anteflexed normal size for age. No adnexal masses noted. Other: No ascites or free abdominal air. Musculoskeletal: No destructive bony lesions are noted. Mild degenerative changes lower thoracic and lumbar spine. IMPRESSION: 1. Extensive colonic diverticulosis noted. In axial image 62 there is mild focal thickening of diverticular wall and colonic wall in left lower quadrant at the junction of descending colon with sigmoid colon. Mild stranding of pericolonic fat. Focal colitis early diverticulitis or small diverticular bleed cannot be excluded. There is no evidence of pericolonic abscess or hematoma. No extraluminal air. 2. Normal appendix.  No pericecal inflammation. 3. No hydronephrosis or hydroureter. 4. No small bowel obstruction. Electronically Signed   By: LLahoma CrockerM.D.   On: 01/07/2016 22:56   Dg Chest Port 1 View  01/08/2016  CLINICAL DATA:  Acute onset of bloody  stools and shortness of breath. Initial encounter. EXAM: PORTABLE CHEST 1 VIEW COMPARISON:  Chest radiograph performed 12/27/2015 FINDINGS: The lungs are well-aerated and clear. There is no evidence of focal opacification, pleural effusion or pneumothorax. The cardiomediastinal silhouette is borderline enlarged. No acute osseous abnormalities are seen. IMPRESSION: Borderline cardiomegaly.  Lungs remain grossly clear. Electronically Signed   By: Garald Balding M.D.   On: 01/08/2016 02:14   Current Outpatient Prescriptions on File Prior to Visit  Medication Sig Dispense Refill  . albuterol (PROVENTIL HFA;VENTOLIN HFA) 108 (90 Base) MCG/ACT inhaler Inhale 2 puffs into the lungs every 6 (six) hours as needed for wheezing or shortness of breath. 1 Inhaler 2  . aspirin 81 MG chewable tablet Chew 1 tablet (81 mg total) by mouth daily. 30 tablet 1  . CARTIA XT 120 MG 24 hr capsule TAKE ONE CAPSULE BY MOUTH DAILY (Patient taking differently: TAKE 120 MG BY MOUTH DAILY) 30 capsule 3  . fexofenadine (ALLEGRA) 30 MG tablet Take 30 mg by mouth every other day.    . fluticasone furoate-vilanterol (BREO ELLIPTA) 100-25 MCG/INH AEPB Inhale 1 puff into the lungs daily. 1 each 5  . furosemide (LASIX) 20 MG tablet Take 2 tablets by mouth every morning and 1 tablet by mouth every evening. 45 tablet 1  . montelukast (SINGULAIR) 10 MG tablet TAKE 1 TABLET BY MOUTH EVERY NIGHT AT BEDTIME (Patient taking differently: TAKE 10 MG BY MOUTH EVERY NIGHT AT BEDTIME) 90 tablet 0  . potassium chloride SA (K-DUR,KLOR-CON) 20 MEQ tablet TAKE 1/2 TABLET(10 MEQ) BY MOUTH DAILY (Patient taking differently: Take 10 mEq by mouth daily. ) 45 tablet 3  . vitamin B-12 (CYANOCOBALAMIN) 1000 MCG tablet Take 1,000 mcg by mouth daily.     No current facility-administered medications on file prior to visit.    No Known Allergies  Past Medical History  Diagnosis Date  . Umbilical hernia   . Asthma   . Anxiety   . Depression   . Shortness  of breath   . Pneumonia     hx of  . Arthritis   . Anemia     as teen  . PONV (postoperative nausea and vomiting)   . Family history of anesthesia complication     vomiting  . Histoplasmosis     left eye  . Hypertension   . Restrictive lung disease   . COPD, mild ([place])   . Heart failure ([place])     New onset 07/25/14  . Obesity (BMI 30-39.9)   . Tobacco abuse   . Hyperkalemia   . CHF (congestive heart failure) [facility])     Past Surgical History  Procedure Laterality Date  . Cesarean section  04/12/85  . Ventral hernia repair N/A 03/19/2013    Procedure:  OPEN VENTRAL HERNIA REPAIR WITH MESH AND APPLICATION OF WOUND VAC;  Surgeon: Madilyn Hook, DO;  Location: [facility];  Service: General;  Laterality: N/A;  . Insertion of mesh N/A 03/19/2013    Procedure: INSERTION OF MESH;  Surgeon: Madilyn Hook, DO;  Location: [facility];  Service: General;  Laterality: N/A;  . Application of wound vac  03/19/2013    Procedure: APPLICATION OF WOUND VAC;  Surgeon: Madilyn Hook, DO;  Location: [facility];  Service: General;;    Family History  Problem Relation Age of Onset  . Heart attack Mother   . Emphysema Mother     was a smoker  . Lymphoma Maternal  Uncle   . Pancreatic cancer Maternal Aunt   . Emphysema      Mother  . Heart failure      Father  . Cancer      Unknown    Social History   Social History  . Marital Status: Married    Spouse Name: N/A  . Number of Children: 1  . Years of Education: N/A   Occupational History  . Retired    Social History Main Topics  . Smoking status: Former Smoker -- 3.00 packs/day for 43 years    Types: Cigarettes    Quit date: 12/17/2012  . Smokeless tobacco: Not on file  . Alcohol Use: 0.0 oz/week    0 Standard drinks or equivalent per week  . Drug Use: No  . Sexual Activity: Not on file   Other Topics Concern  . Not on file   Social History Narrative   ** Merged History Encounter **       ** Data from: 07/25/14 Enc Dept: [facility]       **  Data from: 06/05/13 Enc Dept: [facility]   Married, one son 30 yo.            The PMH, PSH, Social History, Family History, Medications, and allergies have been reviewed in [facility], and have been updated if relevant.   Review of Systems  Constitutional: Negative.   Cardiovascular: Negative.   Gastrointestinal: Negative.   Genitourinary: Negative.   Musculoskeletal: Negative.   Skin: Negative.   Neurological: Negative.   Hematological: Negative.   Psychiatric/Behavioral: Negative.   All other systems reviewed and are negative.      Objective:    BP 112/66 mmHg  Pulse 88  Temp(Src) 97.6 F (36.4 C) (Oral)  Wt 229 lb 12 oz (104.214 kg)  SpO2 92%   Physical Exam  Constitutional: She is oriented to person, place, and time. She appears well-developed and well-nourished. No distress.  HENT:  Head: Normocephalic.  Eyes: Conjunctivae are normal.  Cardiovascular: Normal heart sounds.   Pulmonary/Chest: Effort normal.  Abdominal: Soft. She exhibits no distension. There is no tenderness.  Musculoskeletal: Normal range of motion.  Neurological: She is alert and oriented to person, place, and time. No cranial nerve deficit.  Skin: Skin is warm and dry. She is not diaphoretic.  Psychiatric: She has a normal mood and affect. Her behavior is normal. Judgment and thought content normal.  Nursing note and vitals reviewed.         Assessment & Plan:   Sepsis, due to unspecified organism [facility])  Diverticulitis of colon  Leukocytosis  Gastrointestinal hemorrhage, unspecified gastritis, unspecified gastrointestinal hemorrhage type No Follow-up on file.

## 2016-01-21 ENCOUNTER — Telehealth: Payer: Self-pay | Admitting: *Deleted

## 2016-01-21 NOTE — Telephone Encounter (Signed)
Deborah [name] called back and said that the current CPAP machine that she has was given to her by the hospital. Lenna Sciara states that the hospital unit she has needs to be returned to the hospital.  She needs a new CPAP machine ordered.  She said that she can not order supplies for the hospital machine because it is a loaner, not on patient's insurance, so she cannot get supplies for it.  She has to have a new order for a new machine with pressure settings.  Dr. Elsworth Soho, please advise.

## 2016-01-21 NOTE — Telephone Encounter (Signed)
No new order Obtain OV to discuss , if needed

## 2016-01-21 NOTE — Telephone Encounter (Signed)
-----   Message from Darlina Guys sent at 01/20/2016  1:26 PM EDT ----- Regarding: RE: CPAP [name].  I need to talk to you about this one.  Can you give me a call please? [phone] Thanks  ----- Message -----    From: Glean Hess, CMA    Sent: 01/20/2016   9:11 AM      To: Melissa Stenson Subject: CPAP                                            Per Dr. Elsworth Soho:  No new Rx required - no new unit Non compliant pt on prior download AHI 6.8 OK to provide new supplies for existing machine

## 2016-01-21 NOTE — Telephone Encounter (Signed)
Attempted to call Melissa back, Left message call back.

## 2016-01-26 NOTE — Telephone Encounter (Signed)
Patient aware and has appointment scheduled with Dr. Elsworth Soho on 02/17/16. Nothing further needed.

## 2016-02-04 ENCOUNTER — Other Ambulatory Visit: Payer: Self-pay | Admitting: Primary Care

## 2016-02-06 ENCOUNTER — Other Ambulatory Visit: Payer: Self-pay | Admitting: Family Medicine

## 2016-02-06 NOTE — Telephone Encounter (Signed)
To PCP

## 2016-02-17 ENCOUNTER — Encounter: Payer: Self-pay | Admitting: Pulmonary Disease

## 2016-02-17 ENCOUNTER — Ambulatory Visit (INDEPENDENT_AMBULATORY_CARE_PROVIDER_SITE_OTHER): Payer: 59 | Admitting: Pulmonary Disease

## 2016-02-17 VITALS — BP 124/94 | HR 105 | Ht 63.0 in | Wt 226.0 lb

## 2016-02-17 DIAGNOSIS — J9611 Chronic respiratory failure with hypoxia: Secondary | ICD-10-CM

## 2016-02-17 DIAGNOSIS — J984 Other disorders of lung: Secondary | ICD-10-CM | POA: Diagnosis not present

## 2016-02-17 DIAGNOSIS — G4733 Obstructive sleep apnea (adult) (pediatric): Secondary | ICD-10-CM

## 2016-02-17 NOTE — Assessment & Plan Note (Signed)
Continue using Breo once every day-you do not need prednisone  Referral to pulmonary rehabilitation

## 2016-02-17 NOTE — Progress Notes (Signed)
Subjective:    Patient ID: Deborah [name], female    DOB: [birth year], 58 y.o.   MRN: [id number]  HPI  21 yof with PMH of obesity, mild COPD, OSA adm 07/2013  with hypercarbic, hypoxic resp failure due to volume overload from previously undx CHF.  She smoked up to 3 packs per day for 43 years before quitting in 2015. She developed desat post op from umbilical hernia surgery placed on 02 at 2lpm and referred by Dr [name] 04/16/13 to pulmonary clinic ([facility])  02/17/2016  Chief Complaint  Patient presents with  . Sleep Apnea    Currently using CPAP every night for about 4 hours. Denies issues with machine, mask or pressure. DME is [facility].  Marland Kitchen COPD    Breathing is unchanged since last OV. Reports DOE. Denies chest tightness, wheezing or coughing.    She was provided with CPAP and oxygen after a hospital admit in 07/2013-she was unable to tolerate a full face mask and CPAP was returned in 2016 She has been compliant with oxygen since especially on exertion but not much in sleep  12/2015 adm for hypercarbic resp failure,Improved with BiPAP Then again admitted for diverticulitis This time, she has been provided with a new CPAP with a nasal mask and chin strap-and she feels this works a lot better CPAP download shows octose settings 5-10 cm with good control of events in good compliance without much leak  Prednisone improved her breathing a whole lot and she wonders if she needs this all the time, taking by mouth and compliant   SIGNIFICANT EVENTS / STUDIES:  05/2013 PFTs- normal ratio, moderate restriction, FVC 54%, FEV1 56%, small airways with significant bronchodilator response, DLCO 60% 1/10 ECHO >> nml LV systolic fxn, dilated hypocontractile RV c/w cor pulmonale 1/12 VQ negative for PE.  1/13 ACE inhibitor changed to ARB and steriods increased yesterday and now wheezing better.   08/2014 PSG - 216 lbs -AHI 7/h, lowest desatn 82%   Past Medical History:  Diagnosis Date  .  Anemia    as teen  . Anxiety   . Arthritis   . Asthma   . CHF (congestive heart failure) ([place])   . COPD, mild ([place])   . Depression   . Family history of anesthesia complication    vomiting  . Heart failure ([place])    New onset 07/25/14  . Histoplasmosis    left eye  . Hyperkalemia   . Hypertension   . Obesity (BMI 30-39.9)   . Pneumonia    hx of  . PONV (postoperative nausea and vomiting)   . Restrictive lung disease   . Shortness of breath   . Tobacco abuse   . Umbilical hernia      Review of Systems neg for any significant sore throat, dysphagia, itching, sneezing, nasal congestion or excess/ purulent secretions, fever, chills, sweats, unintended wt loss, pleuritic or exertional cp, hempoptysis, orthopnea pnd or change in chronic leg swelling. Also denies presyncope, palpitations, heartburn, abdominal pain, nausea, vomiting, diarrhea or change in bowel or urinary habits, dysuria,hematuria, rash, arthralgias, visual complaints, headache, numbness weakness or ataxia.     Objective:   Physical Exam  Gen. Pleasant, obese, in no distress ENT - no lesions, no post nasal drip Neck: No JVD, no thyromegaly, no carotid bruits Lungs: no use of accessory muscles, no dullness to percussion, decreased without rales or rhonchi  Cardiovascular: Rhythm regular, heart sounds  normal, no murmurs or gallops, no peripheral edema Musculoskeletal: No  deformities, no cyanosis or clubbing , no tremors       Assessment & Plan:

## 2016-02-17 NOTE — Patient Instructions (Signed)
Continue using CPAP every night  Check nocturnal oximetry on CPAP/room air -based on this will tell you about the need for Blending oxygen into CPAP  Continue using Breo once every day-you do not need prednisone  Pulmonary rehabilitation referral

## 2016-02-17 NOTE — Assessment & Plan Note (Signed)
Continue using CPAP every night    Pulmonary rehabilitation referral

## 2016-02-17 NOTE — Assessment & Plan Note (Signed)
Check nocturnal oximetry on CPAP/room air -based on this will tell you about the need for Blending oxygen into CPAP

## 2016-02-22 ENCOUNTER — Other Ambulatory Visit: Payer: Self-pay | Admitting: Family Medicine

## 2016-02-29 ENCOUNTER — Other Ambulatory Visit: Payer: Self-pay

## 2016-02-29 ENCOUNTER — Other Ambulatory Visit: Payer: Self-pay | Admitting: Family Medicine

## 2016-02-29 MED ORDER — FUROSEMIDE 20 MG PO TABS: ORAL_TABLET | ORAL | 0 refills | Status: DC

## 2016-02-29 NOTE — Telephone Encounter (Signed)
Pt left v/m that lasix refill had been denied; [facility] [address]. Pt wants to know why denied. Last refilled # 45 on 02/07/16 but pt takes 3 tabs daily; last seen 01/20/16. Pt request cb.

## 2016-03-13 ENCOUNTER — Other Ambulatory Visit: Payer: Self-pay | Admitting: Family Medicine

## 2016-03-14 NOTE — Telephone Encounter (Signed)
Last labs 12/2015

## 2016-03-22 ENCOUNTER — Telehealth: Payer: Self-pay | Admitting: Internal Medicine

## 2016-03-23 ENCOUNTER — Encounter: Payer: Self-pay | Admitting: *Deleted

## 2016-03-23 NOTE — Telephone Encounter (Signed)
Pt has been hospitalized several times recently and needs to see Dr. Hilarie Fredrickson prior to colon. Pt scheduled to see Dr. Hilarie Fredrickson 03/24/16'@8'$ :45am. Pt aware of appt.

## 2016-03-24 ENCOUNTER — Ambulatory Visit (INDEPENDENT_AMBULATORY_CARE_PROVIDER_SITE_OTHER): Payer: 59 | Admitting: Internal Medicine

## 2016-03-24 ENCOUNTER — Encounter: Payer: Self-pay | Admitting: Internal Medicine

## 2016-03-24 VITALS — BP 128/82 | HR 92 | Ht 64.0 in | Wt 226.0 lb

## 2016-03-24 DIAGNOSIS — Z1211 Encounter for screening for malignant neoplasm of colon: Secondary | ICD-10-CM | POA: Diagnosis not present

## 2016-03-24 DIAGNOSIS — Z8719 Personal history of other diseases of the digestive system: Secondary | ICD-10-CM | POA: Diagnosis not present

## 2016-03-24 NOTE — Patient Instructions (Addendum)
Please purchase the following medications over the counter and take as directed: Miralax 17 grams (1 capful) daily  You have been scheduled for a colonoscopy. Please follow written instructions given to you at your visit today.  Please pick up your prep supplies at the pharmacy within the next 1-3 days. If you use inhalers (even only as needed), please bring them with you on the day of your procedure. Your physician has requested that you go to www.startemmi.com and enter the access code given to you at your visit today. This web site gives a general overview about your procedure. However, you should still follow specific instructions given to you by our office regarding your preparation for the procedure.  If you are age 1 or older, your body mass index should be between 23-30. Your Body mass index is 38.79 kg/m. If this is out of the aforementioned range listed, please consider follow up with your Primary Care Provider.  If you are age 59 or younger, your body mass index should be between 19-25. Your Body mass index is 38.79 kg/m. If this is out of the aformentioned range listed, please consider follow up with your Primary Care Provider.

## 2016-03-24 NOTE — Progress Notes (Signed)
Patient ID: Deborah [name], female   DOB: September 27, 1955, 60 y.o.   MRN: [id number] HPI: Eithel Ryall is a 60 year old female with a past medical history of mild COPD, sleep apnea, history of hypoxic respiratory failure due to volume overload from undiagnosed CHF, former tobacco abuse now in remission, hypertension, and colonic diverticulitis diagnosed in June who is seen to discuss history of diverticulitis and consider screening colonoscopy. She is seen in consultation at the request of Dr. [name] and presents with her husband.  She reports that she has a history of constipation which father's her only intermittently. She reports a bowel movement every 3-4 days which normally doesn't bother her and has been her long-standing routine. Occasionally she does fill bloating and lower abdominal pressure which she attributes to constipation. She does not use laxative and simply "waits it out". Recently she denies abdominal pain. Stools have been formed without blood or melena. She denies upper abdominal complaint including heartburn, dysphagia and odynophagia. She denies nausea and vomiting. Reports good appetite.  In June 2017 she had bloody stools over about 4 hours. Her stool was loose around that time. She went to the hospital and had a CT scan on 01/07/2016 which showed extensive colonic diverticulosis and mild focal thickening of diverticular wall in the left lower quadrant near the descending and sigmoid colon. There was mild stranding. She was treated for diverticulitis. Since this time she's had no further bleeding. She actually did not have abdominal pain during that period.  She is never had a colonoscopy. She reports her mother had issues with diverticulosis and diverticulitis but also colon polyps.  She has been wearing oxygen and has follow-up with Dr. Elsworth Soho, her pulmonologist. She expects to come off of oxygen seen but may need it at night with her CPAP  Past Medical History:  Diagnosis Date  .  Anemia    as teen  . Anxiety   . Arthritis   . Asthma   . CHF (congestive heart failure) ([place])   . COPD, mild ([place])   . Depression   . Diverticulitis   . Family history of anesthesia complication    vomiting  . GI bleeding   . Heart failure ([place])    New onset 07/25/14  . Histoplasmosis    left eye  . Hyperkalemia   . Hypertension   . Obesity (BMI 30-39.9)   . Pneumonia    hx of  . PONV (postoperative nausea and vomiting)   . Restrictive lung disease   . Shortness of breath   . Tobacco abuse   . Umbilical hernia     Past Surgical History:  Procedure Laterality Date  . APPLICATION OF WOUND VAC  03/19/2013   Procedure: APPLICATION OF WOUND VAC;  Surgeon: Madilyn Hook, DO;  Location: [facility];  Service: General;;  . CESAREAN SECTION  04/12/85  . INSERTION OF MESH N/A 03/19/2013   Procedure: INSERTION OF MESH;  Surgeon: Madilyn Hook, DO;  Location: [facility];  Service: General;  Laterality: N/A;  . VENTRAL HERNIA REPAIR N/A 03/19/2013   Procedure:  OPEN VENTRAL HERNIA REPAIR WITH MESH AND APPLICATION OF WOUND VAC;  Surgeon: Madilyn Hook, DO;  Location: [facility];  Service: General;  Laterality: N/A;    Outpatient Medications Prior to Visit  Medication Sig Dispense Refill  . albuterol (PROVENTIL HFA;VENTOLIN HFA) 108 (90 Base) MCG/ACT inhaler Inhale 2 puffs into the lungs every 6 (six) hours as needed for wheezing or shortness of breath. 1 Inhaler 2  .  aspirin 81 MG chewable tablet Chew 1 tablet (81 mg total) by mouth daily. 30 tablet 1  . CARTIA XT 120 MG 24 hr capsule TAKE ONE CAPSULE BY MOUTH DAILY (Patient taking differently: TAKE 120 MG BY MOUTH DAILY) 30 capsule 3  . fexofenadine (ALLEGRA) 30 MG tablet Take 30 mg by mouth every other day.    . fluticasone furoate-vilanterol (BREO ELLIPTA) 100-25 MCG/INH AEPB Inhale 1 puff into the lungs daily. 1 each 5  . furosemide (LASIX) 20 MG tablet TAKE 2 TABLETS BY MOUTH EVERY MORNING AND 1 TABLET BY MOUTH EVERY EVENING 45 tablet 0  . montelukast  (SINGULAIR) 10 MG tablet TAKE 1 TABLET BY MOUTH EVERY NIGHT AT BEDTIME 90 tablet 0  . potassium chloride SA (K-DUR,KLOR-CON) 20 MEQ tablet TAKE 1/2 TABLET(10 MEQ) BY MOUTH DAILY (Patient taking differently: Take 10 mEq by mouth daily. ) 45 tablet 3  . vitamin B-12 (CYANOCOBALAMIN) 1000 MCG tablet Take 1,000 mcg by mouth daily.     No facility-administered medications prior to visit.     No Known Allergies  Family History  Problem Relation Age of Onset  . Heart attack Mother   . Emphysema Mother     was a smoker  . Lymphoma Maternal Uncle   . Pancreatic cancer Maternal Aunt   . Emphysema      Mother  . Heart failure      Father  . Cancer      Unknown  . Colon cancer Neg Hx   . Breast cancer Neg Hx     Social History  Substance Use Topics  . Smoking status: Former Smoker    Packs/day: 3.00    Years: 43.00    Types: Cigarettes    Quit date: 12/17/2012  . Smokeless tobacco: Never Used  . Alcohol use No    ROS: As per history of present illness, otherwise negative  BP 128/82   Pulse 92   Ht '5\' 4"'$  (1.626 m)   Wt 226 lb (102.5 kg)   BMI 38.79 kg/m  Constitutional: Well-developed and well-nourished. No distress. HEENT: Normocephalic and atraumatic. Oropharynx is clear and moist. No oropharyngeal exudate. Conjunctivae are normal.  No scleral icterus.Oxygen by nasal cannula Neck: Neck supple. Trachea midline. Cardiovascular: Normal rate, regular rhythm and intact distal pulses. No M/R/G Pulmonary/chest: Effort normal and breath sounds normal. No wheezing, rales or rhonchi. Abdominal: Soft, obese, nontender, nondistended. Bowel sounds active throughout.  Extremities: no clubbing, cyanosis, or edema Lymphadenopathy: No cervical adenopathy noted. Neurological: Alert and oriented to person place and time. Skin: Skin is warm and dry.  Psychiatric: Normal mood and affect. Behavior is normal.  RELEVANT LABS AND IMAGING: CBC    Component Value Date/Time   WBC 10.1 01/20/2016  1020   RBC 4.36 01/20/2016 1020   HGB 13.1 01/20/2016 1020   HCT 39.6 01/20/2016 1020   PLT 270.0 01/20/2016 1020   MCV 90.8 01/20/2016 1020   MCH 30.6 01/09/2016 0552   MCHC 33.0 01/20/2016 1020   RDW 15.5 01/20/2016 1020   LYMPHSABS 2.3 01/20/2016 1020   MONOABS 0.8 01/20/2016 1020   EOSABS 0.2 01/20/2016 1020   BASOSABS 0.1 01/20/2016 1020    CMP     Component Value Date/Time   NA 140 01/08/2016 0453   K 3.7 01/08/2016 0453   CL 105 01/08/2016 0453   CO2 28 01/08/2016 0453   GLUCOSE 92 01/08/2016 0453   BUN 20 01/08/2016 0453   CREATININE 0.87 01/08/2016 0453   CALCIUM 8.4 (  L) 01/08/2016 0453   PROT 6.7 01/07/2016 2008   ALBUMIN 3.3 (L) 01/07/2016 2008   AST 20 01/07/2016 2008   ALT 28 01/07/2016 2008   ALKPHOS 73 01/07/2016 2008   BILITOT 0.5 01/07/2016 2008   GFRNONAA >60 01/08/2016 0453   GFRAA >60 01/08/2016 0453   CT ABDOMEN AND PELVIS WITH CONTRAST   TECHNIQUE: Multidetector CT imaging of the abdomen and pelvis was performed using the standard protocol following bolus administration of intravenous contrast.   CONTRAST:  164m ISOVUE-300 IOPAMIDOL (ISOVUE-300) INJECTION 61%   COMPARISON:  None.   FINDINGS: Lower chest: Lung bases shows mild emphysematous changes. Small calcified granulomas are noted right base posteriorly.   Hepatobiliary: Enhanced liver is unremarkable. No calcified gallstones are noted within gallbladder.   Pancreas: Enhanced pancreas is unremarkable.   Spleen: Punctate calcifications are noted within spleen from prior granulomatous disease. No focal splenic mass.   Adrenals/Urinary Tract: No adrenal gland mass. Enhanced kidneys are symmetrical in size. No hydronephrosis or hydroureter.   Stomach/Bowel: There is no gastric outlet obstruction. No small bowel obstruction. No pericecal inflammation. Normal appendix is noted in axial image 41. Scattered colonic diverticula are noted right colon. Multiple colonic diverticula are  noted transverse colon and descending colon. Multiple colonic diverticula are noted in sigmoid colon. In axial image 62 there is focal thickening of colonic wall at the junction of descending colon with proximal sigmoid colon left lower quadrant. Minimal stranding of surrounding fat. Please see coronal image 64. Findings are highly suspicious for focal colitis or early diverticulitis. Tiny diverticular bleed cannot be excluded. Clinical correlation is necessary. There is no evidence of pericolonic abscess or hematoma. No extraluminal air is noted. No distal colonic obstruction.   Vascular/Lymphatic: Atherosclerotic calcifications of abdominal aorta and iliac arteries are noted. No aortic aneurysm.   Reproductive: The uterus is anteflexed normal size for age. No adnexal masses noted.   Other: No ascites or free abdominal air.   Musculoskeletal: No destructive bony lesions are noted. Mild degenerative changes lower thoracic and lumbar spine.   IMPRESSION: 1. Extensive colonic diverticulosis noted. In axial image 62 there is mild focal thickening of diverticular wall and colonic wall in left lower quadrant at the junction of descending colon with sigmoid colon. Mild stranding of pericolonic fat. Focal colitis early diverticulitis or small diverticular bleed cannot be excluded. There is no evidence of pericolonic abscess or hematoma. No extraluminal air. 2. Normal appendix.  No pericecal inflammation. 3. No hydronephrosis or hydroureter. 4. No small bowel obstruction.     Electronically Signed   By: LLahoma CrockerM.D.   On: 01/07/2016 22:56  ASSESSMENT/PLAN: 60year old female with a past medical history of mild COPD, sleep apnea, history of hypoxic respiratory failure due to volume overload from undiagnosed CHF, former tobacco abuse now in remission, hypertension, and colonic diverticulitis diagnosed in June who is seen to discuss history of diverticulitis and consider screening  colonoscopy.  1. History of diverticulitis/constipation -- interestingly she did not have pain at the time that she had bleeding. Her bleeding was self-limited and she was treated with antibiotics. Currently no recent bleeding or abdominal pain. I recommended she begin MiraLAX 17 g daily to try to increase the frequency of bowel movements slightly and avoid constipation. We discussed the importance of high-fiber diet. See #2  2. Colon cancer screening -- I recommended screening colonoscopy. We discussed risks, benefits and alternatives and she wishes to proceed. This will need to be done in the outpatient hospital  setting due to her on March in use. This will also evaluate her previous bleeding episode, diverticulitis episode.      OZ:[id number] M [address], [address] [place], [place] [address]

## 2016-04-03 ENCOUNTER — Other Ambulatory Visit: Payer: Self-pay | Admitting: Cardiovascular Disease

## 2016-04-03 ENCOUNTER — Other Ambulatory Visit: Payer: Self-pay | Admitting: Family Medicine

## 2016-04-03 NOTE — Telephone Encounter (Signed)
Last CMP 12/2015

## 2016-04-05 ENCOUNTER — Telehealth: Payer: Self-pay | Admitting: Internal Medicine

## 2016-04-05 MED ORDER — NA SULFATE-K SULFATE-MG SULF 17.5-3.13-1.6 GM/177ML PO SOLN: ORAL | 0 refills | Status: DC

## 2016-04-05 NOTE — Telephone Encounter (Signed)
Rx sent 

## 2016-04-07 ENCOUNTER — Encounter: Payer: Self-pay | Admitting: Internal Medicine

## 2016-04-17 ENCOUNTER — Encounter (HOSPITAL_COMMUNITY): Payer: Self-pay

## 2016-04-17 ENCOUNTER — Encounter: Payer: Self-pay | Admitting: Adult Health

## 2016-04-17 ENCOUNTER — Telehealth: Payer: Self-pay

## 2016-04-17 ENCOUNTER — Encounter (HOSPITAL_COMMUNITY)
Admission: RE | Admit: 2016-04-17 | Discharge: 2016-04-17 | Disposition: A | Discharge status: Home / Self Care | Payer: 59 | Source: Ambulatory Visit | Attending: Pulmonary Disease | Admitting: Pulmonary Disease

## 2016-04-17 ENCOUNTER — Telehealth (HOSPITAL_COMMUNITY): Payer: Self-pay | Admitting: *Deleted

## 2016-04-17 VITALS — BP 106/89 | Ht 63.0 in | Wt 230.4 lb

## 2016-04-17 DIAGNOSIS — I110 Hypertensive heart disease with heart failure: Secondary | ICD-10-CM | POA: Diagnosis not present

## 2016-04-17 DIAGNOSIS — Z79899 Other long term (current) drug therapy: Secondary | ICD-10-CM | POA: Insufficient documentation

## 2016-04-17 DIAGNOSIS — J984 Other disorders of lung: Secondary | ICD-10-CM

## 2016-04-17 DIAGNOSIS — E669 Obesity, unspecified: Secondary | ICD-10-CM | POA: Insufficient documentation

## 2016-04-17 DIAGNOSIS — Z7982 Long term (current) use of aspirin: Secondary | ICD-10-CM | POA: Diagnosis not present

## 2016-04-17 DIAGNOSIS — I509 Heart failure, unspecified: Secondary | ICD-10-CM | POA: Diagnosis not present

## 2016-04-17 DIAGNOSIS — Z6841 Body Mass Index (BMI) 40.0 and over, adult: Secondary | ICD-10-CM | POA: Diagnosis not present

## 2016-04-17 DIAGNOSIS — Z87891 Personal history of nicotine dependence: Secondary | ICD-10-CM | POA: Insufficient documentation

## 2016-04-17 DIAGNOSIS — J449 Chronic obstructive pulmonary disease, unspecified: Secondary | ICD-10-CM | POA: Insufficient documentation

## 2016-04-17 DIAGNOSIS — Z7951 Long term (current) use of inhaled steroids: Secondary | ICD-10-CM | POA: Insufficient documentation

## 2016-04-17 NOTE — Telephone Encounter (Signed)
Lori Nurse from Pulmonary rehab states that pt was in their office and her depression screening was very high her PHQ was 6 and 18, pt has seen a counselor before she only went to 2 visits, pt has had thoughts of ending her life. Pt has hx of GI, pt complains of constant back, neck and hand pain. Please call patient.

## 2016-04-17 NOTE — Telephone Encounter (Signed)
Please call pt to come in to be seen ASAP.  Deborah [name], can we also get her into the walk in psychiatry clinic in [place]?

## 2016-04-17 NOTE — Progress Notes (Addendum)
Deborah [name] 60 y.o. female Pulmonary Rehab Orientation Note Patient arrived today in [facility] for orientation to Pulmonary Rehab. She was transported from [employer] via wheel chair. She does carry portable oxygen. Per pt, she uses oxygen continuously. Color good, skin warm and dry. Patient is oriented to time and place. Patient's medical history, psychosocial health, and medications reviewed. Psychosocial assessment reveals pt lives with their spouse. Pt is currently unemployed. Patient states she has no hobbies at this time.  She used to bowl and fish, but due to her present health condition she feels she cannot participate. . Pt reports her stress level is high. Areas of stress/anxiety include her son who is transitioning to a transgender. This is very concerning to her and her husband. Pt  does exhibit  signs of depression. Signs of depression include thoughts of helplessness and hopelessness and difficulty falling asleep and difficulty maintaining sleep. PHQ2/9 score 6/18. Pt shows fair  coping skills with negative outlook . She has tried 2 different anti-depressants in the past, but did not feel they were helpful.  She also saw a counsellor 2 times and again did not feel it helped.  I have left a message for Deborah [name] to be aware of the high PHQ score and also her use of BC arthritis powders twice a day and she has a history of a GI bleed. Offered emotional support and reassurance. I will continue to monitor and evaluate progress toward psychosocial goal(s) of a more positive attitude in life. Hopefully exercise will improve her QOL and depression along with psychological treatment.  Physical assessment reveals heart rate is normal, breath sounds clear to auscultation, no wheezes, rales, or rhonchi. Grip strength equal, strong. Distal pulses 2+ bilateral posterior tibial pulses present. Patient reports she does take medications as prescribed. Patient states she follows a Low Sodium  diet. She has steadily gaining weight over the last few years.. Patient's weight will be monitored closely. Demonstration and practice of PLB using pulse oximeter. Patient able to return demonstration satisfactorily. Safety and hand hygiene in the exercise area reviewed with patient. Patient voices understanding of the information reviewed. Department expectations discussed with patient and achievable goals were set. The patient shows enthusiasm about attending the program and we look forward to working with this nice lady. The patient is scheduled for a 6 min walk test on Thursday April 20, 2016 @ 3:30 pm and to begin exercise on Thursday, April 27, 2016 in the 1:30 pm class.   [id number]

## 2016-04-18 ENCOUNTER — Ambulatory Visit (INDEPENDENT_AMBULATORY_CARE_PROVIDER_SITE_OTHER): Payer: 59 | Admitting: Adult Health

## 2016-04-18 ENCOUNTER — Encounter: Payer: Self-pay | Admitting: Adult Health

## 2016-04-18 DIAGNOSIS — J9611 Chronic respiratory failure with hypoxia: Secondary | ICD-10-CM | POA: Diagnosis not present

## 2016-04-18 DIAGNOSIS — J441 Chronic obstructive pulmonary disease with (acute) exacerbation: Secondary | ICD-10-CM

## 2016-04-18 DIAGNOSIS — G4733 Obstructive sleep apnea (adult) (pediatric): Secondary | ICD-10-CM | POA: Diagnosis not present

## 2016-04-18 DIAGNOSIS — Z23 Encounter for immunization: Secondary | ICD-10-CM

## 2016-04-18 NOTE — Assessment & Plan Note (Signed)
Controlled on current regimen without flare   Plan  Patient Instructions  Start using Saline nasal spray Twice daily  , and Saline nasal gel At bedtime   Stop Afrin .  Continue on Oxygen 2l/m .Marland Kitchen Continue on BREO daily , rinse after use.  Flu shot today .  Continue on CPAP At bedtime   Do not drive if sleepy  Work on weight loss.  Begin O2 2l/m with CPAP At bedtime   Follow up Dr. Elsworth Soho  In 4 months and. As needed

## 2016-04-18 NOTE — Assessment & Plan Note (Signed)
Cont on O2 2l/m  ONO + desats  On CPAP  Add O2 2l/m with CPAP At bedtime

## 2016-04-18 NOTE — Progress Notes (Signed)
Subjective:    Patient ID: Deborah [name], female    DOB: 03/25/1956, 60 y.o.   MRN: [id number]  HPI 60 year old female, former smoker, followed for mild COPD and obstructive sleep apnea/hypercarbic RF on CPAP .   TEST  SIGNIFICANT EVENTS / STUDIES:  05/2013 PFTs- normal ratio, moderate restriction, FVC 54%, FEV1 56%, small airways with significant bronchodilator response, DLCO 60% 1/10 ECHO >>nml LV systolic fxn, dilated hypocontractile RV c/w cor pulmonale 1/12 VQ negative for PE.  1/13 ACE inhibitor changed to ARB and steriods increased yesterday and now wheezing better.   08/2014 PSG - 216 lbs -AHI 7/h, lowest desatn 82%    04/18/2016 Follow up : COPD and OSA  patient returns for a two-month follow-up for COPD and sleep apnea Patient remains on C Pap at bedtime. Says she is doing well. She is on a AutoSet 5-10 cm of H2O. AHI 0.8. Minimum leaks. She has excellent compliance. With average usage at around 7.25 hours. She denies any significant daytime sleepiness. Patient had an overnight oximetry test on C Pap to determine if she needed nocturnal oxygen. This test on showed desats on CPAP . We discussed adding O2 to CPAP At bedtime  .   She remains on BREO for her COPD. Denies flare cough or wheezing. She denies any chest pain, orthopnea, PND, or increased leg swelling Pneumovax is up-to-date January 2016 Would like to get flu shot today.  CXR in 12/2014 showed clear lungs.  Is starting pulmonary rehab , went to orientation yesterday.     Past Medical History:  Diagnosis Date  . Anemia    as teen  . Anxiety   . Arthritis   . Asthma   . CHF (congestive heart failure) ([place])   . COPD, mild ([place])   . Depression   . Diverticulitis   . Family history of anesthesia complication    vomiting  . GI bleeding   . Heart failure ([place])    New onset 07/25/14  . Histoplasmosis    left eye  . Hyperkalemia   . Hypertension   . Obesity (BMI 30-39.9)   . Pneumonia    hx of    . PONV (postoperative nausea and vomiting)   . Restrictive lung disease   . Shortness of breath   . Tobacco abuse   . Umbilical hernia    Current Outpatient Prescriptions on File Prior to Visit  Medication Sig Dispense Refill  . albuterol (PROVENTIL HFA;VENTOLIN HFA) 108 (90 Base) MCG/ACT inhaler Inhale 2 puffs into the lungs every 6 (six) hours as needed for wheezing or shortness of breath. 1 Inhaler 2  . aspirin 81 MG chewable tablet Chew 1 tablet (81 mg total) by mouth daily. 30 tablet 1  . CARTIA XT 120 MG 24 hr capsule TAKE ONE CAPSULE BY MOUTH DAILY 30 capsule 3  . fexofenadine (ALLEGRA) 30 MG tablet Take 30 mg by mouth every other day.    . fluticasone furoate-vilanterol (BREO ELLIPTA) 100-25 MCG/INH AEPB Inhale 1 puff into the lungs daily. 1 each 5  . furosemide (LASIX) 20 MG tablet TAKE 2 TABLETS BY MOUTH EVERY MORNING AND 1 TABLET BY MOUTH EVERY EVENING 45 tablet 0  . montelukast (SINGULAIR) 10 MG tablet TAKE 1 TABLET BY MOUTH EVERY NIGHT AT BEDTIME 90 tablet 0  . potassium chloride SA (K-DUR,KLOR-CON) 20 MEQ tablet TAKE 1/2 TABLET(10 MEQ) BY MOUTH DAILY (Patient taking differently: Take 10 mEq by mouth daily. ) 45 tablet 3  . vitamin B-12 (  CYANOCOBALAMIN) 1000 MCG tablet Take 1,000 mcg by mouth daily.     No current facility-administered medications on file prior to visit.       Review of Systems Constitutional:   No  weight loss, night sweats,  Fevers, chills, + fatigue, or  lassitude.  HEENT:   No headaches,  Difficulty swallowing,  Tooth/dental problems, or  Sore throat,                No sneezing, itching, ear ache,  +nasal congestion, post nasal drip,   CV:  No chest pain,  Orthopnea, PND, swelling in lower extremities, anasarca, dizziness, palpitations, syncope.   GI  No heartburn, indigestion, abdominal pain, nausea, vomiting, diarrhea, change in bowel habits, loss of appetite, bloody stools.   Resp:  .  No chest wall deformity  Skin: no rash or  lesions.  GU: no dysuria, change in color of urine, no urgency or frequency.  No flank pain, no hematuria   MS:  No joint pain or swelling.  No decreased range of motion.  No back pain.  Psych:  No change in mood or affect. No depression or anxiety.  No memory loss.         Objective:   Physical Exam Vitals:   04/18/16 1140  BP: 112/70  Pulse: (!) 103  Temp: 97.9 F (36.6 C)  TempSrc: Oral  SpO2: 96%  Weight: 227 lb (103 kg)  Height: '5\' 3"'$  (1.6 m)  Body mass index is 40.21 kg/m.   GEN: A/Ox3; pleasant , NAD, elderly , obese on O2    HEENT:  [place]/AT,  EACs-clear, TMs-wnl, NOSE-clear, THROAT-clear, no lesions, no postnasal drip or exudate noted. Class 2-3 MP airway   NECK:  Supple w/ fair ROM; no JVD; normal carotid impulses w/o bruits; no thyromegaly or nodules palpated; no lymphadenopathy.    RESP  Decreased BS in bases  no accessory muscle use, no dullness to percussion  CARD:  RRR, no m/r/g  , no peripheral edema, pulses intact, no cyanosis or clubbing.  GI:   Soft & nt; nml bowel sounds; no organomegaly or masses detected.   Musco: Warm bil, no deformities or joint swelling noted.   Neuro: alert, no focal deficits noted.    Skin: Warm, no lesions or rashes  Tammy Parrett NP-C  [facility]  04/18/2016        Assessment & Plan:

## 2016-04-18 NOTE — Patient Instructions (Addendum)
Start using Saline nasal spray Twice daily  , and Saline nasal gel At bedtime   Stop Afrin .  Continue on Oxygen 2l/m .Marland Kitchen Continue on BREO daily , rinse after use.  Flu shot today .  Continue on CPAP At bedtime   Do not drive if sleepy  Work on weight loss.  Begin O2 2l/m with CPAP At bedtime   Follow up Dr. Elsworth Soho  In 4 months and. As needed

## 2016-04-18 NOTE — Assessment & Plan Note (Signed)
Doing well on CPAP   Plan  Patient Instructions  Start using Saline nasal spray Twice daily  , and Saline nasal gel At bedtime   Stop Afrin .  Continue on Oxygen 2l/m .Marland Kitchen Continue on BREO daily , rinse after use.  Flu shot today .  Continue on CPAP At bedtime   Do not drive if sleepy  Work on weight loss.  Begin O2 2l/m with CPAP At bedtime   Follow up Dr. Elsworth Soho  In 4 months and. As needed

## 2016-04-18 NOTE — Telephone Encounter (Signed)
Deborah [name] called the patient to schedule her the Appt with you and her husband answered the phone and told Deborah [name] she was sleeping and hung up the phone on her.

## 2016-04-18 NOTE — Progress Notes (Signed)
Reviewed & agree with plan  

## 2016-04-19 ENCOUNTER — Encounter (HOSPITAL_COMMUNITY): Payer: Self-pay | Admitting: *Deleted

## 2016-04-20 ENCOUNTER — Encounter: Payer: Self-pay | Admitting: Family Medicine

## 2016-04-20 ENCOUNTER — Encounter (HOSPITAL_COMMUNITY)
Admission: RE | Admit: 2016-04-20 | Discharge: 2016-04-20 | Disposition: A | Discharge status: Home / Self Care | Payer: 59 | Source: Ambulatory Visit | Attending: Pulmonary Disease | Admitting: Pulmonary Disease

## 2016-04-20 ENCOUNTER — Other Ambulatory Visit: Payer: Self-pay | Admitting: Family Medicine

## 2016-04-20 ENCOUNTER — Ambulatory Visit (INDEPENDENT_AMBULATORY_CARE_PROVIDER_SITE_OTHER): Payer: 59 | Admitting: Family Medicine

## 2016-04-20 DIAGNOSIS — J984 Other disorders of lung: Secondary | ICD-10-CM | POA: Diagnosis not present

## 2016-04-20 DIAGNOSIS — F32A Depression, unspecified: Secondary | ICD-10-CM | POA: Insufficient documentation

## 2016-04-20 DIAGNOSIS — F329 Major depressive disorder, single episode, unspecified: Secondary | ICD-10-CM

## 2016-04-20 NOTE — Progress Notes (Signed)
Subjective:   Patient ID: Deborah [name], female    DOB: 1956-06-06, 60 y.o.   MRN: [id number]  Deborah [name] is a pleasant 60 y.o. year old female who presents to clinic today with Follow-up (per Dr [name])  on 04/20/2016  HPI:  Received the following note on 10/2-  Lori Nurse from Pulmonary rehab states that pt was in their office and her depression screening was very high her PHQ was 6 and 18, pt has seen a counselor before she only went to 2 visits, pt has had thoughts of ending her life. Pt has hx of GI, pt complains of constant back, neck and hand pain. Please call patient.    She reports that she has felt this way for "40 years." Her son is transgendered which has been causing her more anxiety but does not feel she is more depressed. She endorses having suicidal thoughts "her entire life" but has never had a plan.  "I could never do that to my family."  She has been on celexa and buspar in past- both ineffective and felt buspar "made me crazier."  Father had Bipolar disorder.  She denies any symptoms of mania.  Current Outpatient Prescriptions on File Prior to Visit  Medication Sig Dispense Refill  . albuterol (PROVENTIL HFA;VENTOLIN HFA) 108 (90 Base) MCG/ACT inhaler Inhale 2 puffs into the lungs every 6 (six) hours as needed for wheezing or shortness of breath. 1 Inhaler 2  . aspirin 81 MG chewable tablet Chew 1 tablet (81 mg total) by mouth daily. 30 tablet 1  . CARTIA XT 120 MG 24 hr capsule TAKE ONE CAPSULE BY MOUTH DAILY 30 capsule 3  . fexofenadine (ALLEGRA) 30 MG tablet Take 30 mg by mouth every other day.    . fluticasone furoate-vilanterol (BREO ELLIPTA) 100-25 MCG/INH AEPB Inhale 1 puff into the lungs daily. 1 each 5  . furosemide (LASIX) 20 MG tablet TAKE 2 TABLETS BY MOUTH EVERY MORNING AND 1 TABLET BY MOUTH EVERY EVENING 45 tablet 0  . montelukast (SINGULAIR) 10 MG tablet TAKE 1 TABLET BY MOUTH EVERY NIGHT AT BEDTIME 90 tablet 0  . potassium chloride SA  (K-DUR,KLOR-CON) 20 MEQ tablet TAKE 1/2 TABLET(10 MEQ) BY MOUTH DAILY (Patient taking differently: Take 10 mEq by mouth daily. ) 45 tablet 3  . vitamin B-12 (CYANOCOBALAMIN) 1000 MCG tablet Take 1,000 mcg by mouth daily.     No current facility-administered medications on file prior to visit.     No Known Allergies  Past Medical History:  Diagnosis Date  . Anemia    as teen  . Anxiety   . Arthritis   . Asthma   . CHF (congestive heart failure) ([employer])   . COPD, mild ([place])   . Depression   . Diverticulitis   . Family history of anesthesia complication    vomiting  . GI bleeding   . Heart failure ([place])    New onset 07/25/14  . Histoplasmosis    left eye  . Hyperkalemia   . Hypertension   . Obesity (BMI 30-39.9)   . Pneumonia    hx of  . PONV (postoperative nausea and vomiting)   . Restrictive lung disease   . Shortness of breath   . Tobacco abuse   . Umbilical hernia     Past Surgical History:  Procedure Laterality Date  . APPLICATION OF WOUND VAC  03/19/2013   Procedure: APPLICATION OF WOUND VAC;  Surgeon: Madilyn Hook, DO;  Location: [facility];  Service: General;;  . CESAREAN SECTION  04/12/85  . INSERTION OF MESH N/A 03/19/2013   Procedure: INSERTION OF MESH;  Surgeon: Madilyn Hook, DO;  Location: [facility];  Service: General;  Laterality: N/A;  . VENTRAL HERNIA REPAIR N/A 03/19/2013   Procedure:  OPEN VENTRAL HERNIA REPAIR WITH MESH AND APPLICATION OF WOUND VAC;  Surgeon: Madilyn Hook, DO;  Location: [facility];  Service: General;  Laterality: N/A;    Family History  Problem Relation Age of Onset  . Heart attack Mother   . Emphysema Mother     was a smoker  . Lymphoma Maternal Uncle   . Pancreatic cancer Maternal Aunt   . Emphysema      Mother  . Heart failure      Father  . Cancer      Unknown  . Colon cancer Neg Hx   . Breast cancer Neg Hx     Social History   Social History  . Marital status: Married    Spouse name: N/A  . Number of children: 1  . Years of  education: N/A   Occupational History  . Retired Not Employed   Social History Main Topics  . Smoking status: Former Smoker    Packs/day: 3.00    Years: 43.00    Types: Cigarettes    Quit date: 12/17/2012  . Smokeless tobacco: Never Used  . Alcohol use No  . Drug use: No  . Sexual activity: Not on file   Other Topics Concern  . Not on file   Social History Narrative   ** Merged History Encounter **       ** Data from: 07/25/14 Enc Dept: [facility]       ** Data from: 06/05/13 Enc Dept: [facility]   Married, one son 58 yo.            The PMH, PSH, Social History, Family History, Medications, and allergies have been reviewed in [facility], and have been updated if relevant.   Review of Systems  Psychiatric/Behavioral: Positive for dysphoric mood and suicidal ideas. Negative for hallucinations, self-injury and sleep disturbance. The patient is not nervous/anxious and is not hyperactive.   All other systems reviewed and are negative.      Objective:    BP 124/66   Pulse 92   Temp 97.8 F (36.6 C) (Oral)   Wt 227 lb 8 oz (103.2 kg)   SpO2 95%   BMI 40.30 kg/m    Physical Exam  Constitutional: She is oriented to person, place, and time. She appears well-developed and well-nourished. No distress.  HENT:  Head: Normocephalic.  Eyes: Conjunctivae are normal.  Cardiovascular: Normal rate.   Pulmonary/Chest: Effort normal.  Neurological: She is alert and oriented to person, place, and time.  Skin: Skin is warm and dry. She is not diaphoretic.  Psychiatric: She has a normal mood and affect. Her behavior is normal. Judgment and thought content normal.  Nursing note and vitals reviewed.         Assessment & Plan:   Depression, unspecified depression type No Follow-up on file.

## 2016-04-20 NOTE — Patient Instructions (Signed)
Great to see you.  I am so sorry you are under so much stress.  Please go to the psychiatry walk in clinic.  If you have any active thoughts of suicide, please go to the ER.

## 2016-04-20 NOTE — Assessment & Plan Note (Signed)
>  25 minutes spent in face to face time with patient, >50% spent in counselling or coordination of care Pt is contracted for safety. Given address and phone number for walk in psychiatry clinic. She says that she will go and counseled to go to the ER if she does develop active suicidal thoughts. She does not want to start rx today which I agree is appropriate given adverse rxs in past.

## 2016-04-20 NOTE — Progress Notes (Signed)
Pre visit review using our clinic review tool, if applicable. No additional management support is needed unless otherwise documented below in the visit note. 

## 2016-04-20 NOTE — Progress Notes (Signed)
Pulmonary Individual Treatment Plan  Patient Details  Name: Deborah [name] MRN: [id number] Date of Birth: [birth year] Referring Provider:   April Manson Pulmonary Rehab Walk Test from 04/20/2016 in [place]  Referring Provider  Dr. Elsworth Soho      Initial Encounter Date:  Flowsheet Row Pulmonary Rehab Walk Test from 04/20/2016 in [place]  Date  04/20/16  Referring Provider  Dr. Elsworth Soho      Visit Diagnosis: Restrictive lung disease  Patient's Home Medications on Admission:   Current Outpatient Prescriptions:  .  albuterol (PROVENTIL HFA;VENTOLIN HFA) 108 (90 Base) MCG/ACT inhaler, Inhale 2 puffs into the lungs every 6 (six) hours as needed for wheezing or shortness of breath., Disp: 1 Inhaler, Rfl: 2 .  aspirin 81 MG chewable tablet, Chew 1 tablet (81 mg total) by mouth daily., Disp: 30 tablet, Rfl: 1 .  CARTIA XT 120 MG 24 hr capsule, TAKE ONE CAPSULE BY MOUTH DAILY, Disp: 30 capsule, Rfl: 3 .  fexofenadine (ALLEGRA) 30 MG tablet, Take 30 mg by mouth every other day., Disp: , Rfl:  .  fluticasone furoate-vilanterol (BREO ELLIPTA) 100-25 MCG/INH AEPB, Inhale 1 puff into the lungs daily., Disp: 1 each, Rfl: 5 .  furosemide (LASIX) 20 MG tablet, TAKE 2 TABLETS BY MOUTH EVERY MORNING AND 1 TABLET BY MOUTH EVERY EVENING, Disp: 45 tablet, Rfl: 0 .  montelukast (SINGULAIR) 10 MG tablet, TAKE 1 TABLET BY MOUTH EVERY NIGHT AT BEDTIME, Disp: 90 tablet, Rfl: 0 .  potassium chloride SA (K-DUR,KLOR-CON) 20 MEQ tablet, TAKE 1/2 TABLET(10 MEQ) BY MOUTH DAILY (Patient taking differently: Take 10 mEq by mouth daily. ), Disp: 45 tablet, Rfl: 3 .  vitamin B-12 (CYANOCOBALAMIN) 1000 MCG tablet, Take 1,000 mcg by mouth daily., Disp: , Rfl:   Past Medical History: Past Medical History:  Diagnosis Date  . Anemia    as teen  . Anxiety   . Arthritis   . Asthma   . CHF (congestive heart failure) ([place])   . COPD, mild ([place])   . Depression    . Diverticulitis   . Family history of anesthesia complication    vomiting  . GI bleeding   . Heart failure ([place])    New onset 07/25/14  . Histoplasmosis    left eye  . Hyperkalemia   . Hypertension   . Obesity (BMI 30-39.9)   . Pneumonia    hx of  . PONV (postoperative nausea and vomiting)   . Restrictive lung disease   . Shortness of breath   . Tobacco abuse   . Umbilical hernia     Tobacco Use: History  Smoking Status  . Former Smoker  . Packs/day: 3.00  . Years: 43.00  . Types: Cigarettes  . Quit date: 12/17/2012  Smokeless Tobacco  . Never Used    Labs: Recent Review Flowsheet Data    Labs for ITP Cardiac and Pulmonary Rehab Latest Ref Rng & Units 03/29/2015 12/18/2015 12/18/2015 12/18/2015 12/18/2015   Cholestrol 0 - 200 mg/dL 200 - - - -   LDLCALC 0 - 99 mg/dL 136(H) - - - -   HDL >39.00 mg/dL 44.60 - - - -   Trlycerides 0.0 - 149.0 mg/dL 98.0 - - - -   Hemoglobin A1c <5.7 % - - - - -   PHART 7.350 - 7.450 - - 7.289(L) 7.253(L) 7.279(L)   PCO2ART 35.0 - 45.0 mmHg - - 74.8(HH) 85.4(HH) 77.8(HH)   HCO3 20.0 -  24.0 mEq/L - 35.6(H) 34.7(H) 36.4(H) 35.3(H)   TCO2 0 - 100 mmol/L - 31.6 31.2 33.2 32.0   O2SAT % - 87.5 90.5 92.7 93.0      Capillary Blood Glucose: Lab Results  Component Value Date   GLUCAP 117 (H) 03/22/2013   GLUCAP 108 (H) 03/22/2013   GLUCAP 112 (H) 03/21/2013   GLUCAP 117 (H) 03/21/2013   GLUCAP 108 (H) 03/21/2013     ADL UCSD:     Pulmonary Assessment Scores    Row Name 04/19/16 1656         ADL UCSD   ADL Phase Entry     SOB Score total 92        Pulmonary Function Assessment:     Pulmonary Function Assessment - 04/17/16 1316      Breath   Bilateral Breath Sounds Clear   Shortness of Breath Yes      Exercise Target Goals: Date: 04/20/16  Exercise Program Goal: Individual exercise prescription set with THRR, safety & activity barriers. Participant demonstrates ability to understand and report RPE using BORG scale, to  self-measure pulse accurately, and to acknowledge the importance of the exercise prescription.  Exercise Prescription Goal: Starting with aerobic activity 30 plus minutes a day, 3 days per week for initial exercise prescription. Provide home exercise prescription and guidelines that participant acknowledges understanding prior to discharge.  Activity Barriers & Risk Stratification:     Activity Barriers & Cardiac Risk Stratification - 04/17/16 1313      Activity Barriers & Cardiac Risk Stratification   Activity Barriers Arthritis;Back Problems      6 Minute Walk:     6 Minute Walk    Row Name 04/20/16 1638         6 Minute Walk   Phase Initial     Distance 1355 feet     Walk Time 6 minutes     # of Rest Breaks 0     MPH 2.56     METS 2.91     RPE 15     Perceived Dyspnea  3     Symptoms No     Resting HR 107 bpm     Resting BP 94/64     Max Ex. HR 154 bpm     Max Ex. BP 124/76       Interval HR   Baseline HR 107     1 Minute HR 117     2 Minute HR 125     3 Minute HR 131     4 Minute HR 132     5 Minute HR 135     6 Minute HR 154     2 Minute Post HR 146     Interval Heart Rate? Yes       Interval Oxygen   Interval Oxygen? Yes     Baseline Oxygen Saturation % 97 %     Baseline Liters of Oxygen 4 L     1 Minute Oxygen Saturation % 96 %     1 Minute Liters of Oxygen 4 L     2 Minute Oxygen Saturation % 93 %     2 Minute Liters of Oxygen 4 L     3 Minute Oxygen Saturation % 92 %     3 Minute Liters of Oxygen 4 L     4 Minute Oxygen Saturation % 93 %     4 Minute Liters of Oxygen 4 L  5 Minute Oxygen Saturation % 93 %     5 Minute Liters of Oxygen 4 L     6 Minute Oxygen Saturation % 92 %     6 Minute Liters of Oxygen 4 L     2 Minute Post Oxygen Saturation % 96 %     2 Minute Post Liters of Oxygen 4 L        Initial Exercise Prescription:     Initial Exercise Prescription - 04/20/16 1600      Date of Initial Exercise RX and Referring Provider    Date 04/20/16   Referring Provider Dr. Elsworth Soho     Oxygen   Oxygen Continuous   Liters 4     Bike   Level 0.5   Minutes 17     NuStep   Level 2   Minutes 17   METs 1.6     Track   Laps 5   Minutes 17     Prescription Details   Frequency (times per week) 2   Duration Progress to 45 minutes of aerobic exercise without signs/symptoms of physical distress     Intensity   THRR 40-80% of Max Heartrate 65-131   Ratings of Perceived Exertion 11-13   Perceived Dyspnea 0-4     Progression   Progression Continue progressive overload as per policy without signs/symptoms or physical distress.     Resistance Training   Training Prescription Yes   Weight green bands   Reps 10-12      Perform Capillary Blood Glucose checks as needed.  Exercise Prescription Changes:   Exercise Comments:   Discharge Exercise Prescription (Final Exercise Prescription Changes):    Nutrition:  Target Goals: Understanding of nutrition guidelines, daily intake of sodium '1500mg'$ , cholesterol '200mg'$ , calories 30% from fat and 7% or less from saturated fats, daily to have 5 or more servings of fruits and vegetables.  Biometrics:     Pre Biometrics - 04/17/16 1314      Pre Biometrics   Grip Strength 29 kg       Nutrition Therapy Plan and Nutrition Goals:   Nutrition Discharge: Rate Your Plate Scores:   Psychosocial: Target Goals: Acknowledge presence or absence of depression, maximize coping skills, provide positive support system. Participant is able to verbalize types and ability to use techniques and skills needed for reducing stress and depression.  Initial Review & Psychosocial Screening:     Initial Psych Review & Screening - 04/17/16 1321      Initial Review   Current issues with Current Depression;History of Depression     Family Dynamics   Good Support System? Yes     Barriers   Psychosocial barriers to participate in program Psychosocial barriers identified (see  note)  will contact Dr. [name] PCP for depression issues     Screening Interventions   Interventions Encouraged to exercise      Quality of Life Scores:     Quality of Life - 04/19/16 1657      Quality of Life Scores   Health/Function Pre 7.2 %   Socioeconomic Pre 20 %   Psych/Spiritual Pre 12.93 %   Family Pre 16.13 %   GLOBAL Pre 12.44 %      PHQ-9: Recent Review Flowsheet Data    Depression screen [facility] 2/9 04/17/2016   Decreased Interest 3   Down, Depressed, Hopeless 3   PHQ - 2 Score 6   Altered sleeping 3   Tired, decreased energy 3   Change  in appetite 2   Feeling bad or failure about yourself  3   Trouble concentrating 0   Moving slowly or fidgety/restless 0   Suicidal thoughts 1   PHQ-9 Score 18   Difficult doing work/chores Very difficult      Psychosocial Evaluation and Intervention:     Psychosocial Evaluation - 04/17/16 1323      Psychosocial Evaluation & Interventions   Interventions Physician referral   Comments Will contact Dr. [name] for depression issues   Continued Psychosocial Services Needed Yes      Psychosocial Re-Evaluation:  Education: Education Goals: Education classes will be provided on a weekly basis, covering required topics. Participant will state understanding/return demonstration of topics presented.  Learning Barriers/Preferences:     Learning Barriers/Preferences - 04/17/16 1313      Learning Barriers/Preferences   Learning Barriers None   Learning Preferences Pictoral;Verbal Instruction      Education Topics: Risk Factor Reduction:  -Group instruction that is supported by a PowerPoint presentation. Instructor discusses the definition of a risk factor, different risk factors for pulmonary disease, and how the heart and lungs work together.     Nutrition for Pulmonary Patient:  -Group instruction provided by PowerPoint slides, verbal discussion, and written materials to support subject matter. The instructor gives an  explanation and review of healthy diet recommendations, which includes a discussion on weight management, recommendations for fruit and vegetable consumption, as well as protein, fluid, caffeine, fiber, sodium, sugar, and alcohol. Tips for eating when patients are short of breath are discussed.   Pursed Lip Breathing:  -Group instruction that is supported by demonstration and informational handouts. Instructor discusses the benefits of pursed lip and diaphragmatic breathing and detailed demonstration on how to preform both.     Oxygen Safety:  -Group instruction provided by PowerPoint, verbal discussion, and written material to support subject matter. There is an overview of "What is Oxygen" and "Why do we need it".  Instructor also reviews how to create a safe environment for oxygen use, the importance of using oxygen as prescribed, and the risks of noncompliance. There is a brief discussion on traveling with oxygen and resources the patient may utilize.   Oxygen Equipment:  -Group instruction provided by [facility] Staff utilizing handouts, written materials, and equipment demonstrations.   Signs and Symptoms:  -Group instruction provided by written material and verbal discussion to support subject matter. Warning signs and symptoms of infection, stroke, and heart attack are reviewed and when to call the physician/911 reinforced. Tips for preventing the spread of infection discussed.   Advanced Directives:  -Group instruction provided by verbal instruction and written material to support subject matter. Instructor reviews Advanced Directive laws and proper instruction for filling out document.   Pulmonary Video:  -Group video education that reviews the importance of medication and oxygen compliance, exercise, good nutrition, pulmonary hygiene, and pursed lip and diaphragmatic breathing for the pulmonary patient.   Exercise for the Pulmonary Patient:  -Group instruction that is  supported by a PowerPoint presentation. Instructor discusses benefits of exercise, core components of exercise, frequency, duration, and intensity of an exercise routine, importance of utilizing pulse oximetry during exercise, safety while exercising, and options of places to exercise outside of rehab.     Pulmonary Medications:  -Verbally interactive group education provided by instructor with focus on inhaled medications and proper administration.   Anatomy and Physiology of the Respiratory System and Intimacy:  -Group instruction provided by PowerPoint, verbal discussion, and written material to  support subject matter. Instructor reviews respiratory cycle and anatomical components of the respiratory system and their functions. Instructor also reviews differences in obstructive and restrictive respiratory diseases with examples of each. Intimacy, Sex, and Sexuality differences are reviewed with a discussion on how relationships can change when diagnosed with pulmonary disease. Common sexual concerns are reviewed.   Knowledge Questionnaire Score:     Knowledge Questionnaire Score - 04/19/16 1652      Knowledge Questionnaire Score   Pre Score 9/13      Core Components/Risk Factors/Patient Goals at Admission:     Personal Goals and Risk Factors at Admission - 04/17/16 1317      Core Components/Risk Factors/Patient Goals on Admission    Weight Management Yes;Obesity   Intervention Weight Management: Develop a combined nutrition and exercise program designed to reach desired caloric intake, while maintaining appropriate intake of nutrient and fiber, sodium and fats, and appropriate energy expenditure required for the weight goal.;Weight Management: Provide education and appropriate resources to help participant work on and attain dietary goals.;Weight Management/Obesity: Establish reasonable short term and long term weight goals.;Obesity: Provide education and appropriate resources to help  participant work on and attain dietary goals.   Admit Weight 230 lb 6.1 oz (104.5 kg)   Goal Weight: Short Term 225 lb (102.1 kg)   Expected Outcomes Short Term: Continue to assess and modify interventions until short term weight is achieved   Increase Strength and Stamina Yes   Intervention Provide advice, education, support and counseling about physical activity/exercise needs.;Develop an individualized exercise prescription for aerobic and resistive training based on initial evaluation findings, risk stratification, comorbidities and participant's personal goals.   Expected Outcomes Achievement of increased cardiorespiratory fitness and enhanced flexibility, muscular endurance and strength shown through measurements of functional capacity and personal statement of participant.   Improve shortness of breath with ADL's Yes   Intervention Provide education, individualized exercise plan and daily activity instruction to help decrease symptoms of SOB with activities of daily living.   Expected Outcomes Short Term: Achieves a reduction of symptoms when performing activities of daily living.      Core Components/Risk Factors/Patient Goals Review:    Core Components/Risk Factors/Patient Goals at Discharge (Final Review):    ITP Comments:   Comments:

## 2016-04-27 ENCOUNTER — Encounter (HOSPITAL_COMMUNITY): Payer: 59

## 2016-05-02 ENCOUNTER — Other Ambulatory Visit: Payer: Self-pay | Admitting: Family Medicine

## 2016-05-02 ENCOUNTER — Encounter (HOSPITAL_COMMUNITY)
Admission: RE | Admit: 2016-05-02 | Discharge: 2016-05-02 | Disposition: A | Discharge status: Home / Self Care | Payer: 59 | Source: Ambulatory Visit | Attending: Pulmonary Disease | Admitting: Pulmonary Disease

## 2016-05-02 VITALS — Wt 229.9 lb

## 2016-05-02 DIAGNOSIS — J984 Other disorders of lung: Secondary | ICD-10-CM | POA: Diagnosis not present

## 2016-05-02 NOTE — Progress Notes (Signed)
Daily Session Note  Patient Details  Name: Deborah [name] MRN: [id number] Date of Birth: 1955-07-30 Referring Provider:   April Manson Pulmonary Rehab Walk Test from 04/20/2016 in [place]  Referring Provider  Dr. Elsworth Soho      Encounter Date: 05/02/2016  Check In:     Session Check In - 05/02/16 1330      Check-In   Location [facility]   Staff Present Rosebud Poles, RN, BSN;Molly diVincenzo, MS, ACSM RCEP, Exercise Physiologist;Portia Rollene Rotunda, RN, BSN   Supervising physician immediately available to respond to emergencies [facility] immediately available   Physician(s) Dr. Alfredia Ferguson   Medication changes reported     No   Fall or balance concerns reported    No   Warm-up and Cool-down Performed as group-led instruction   Resistance Training Performed Yes   VAD Patient? No     Pain Assessment   Currently in Pain? No/denies   Multiple Pain Sites No      Capillary Blood Glucose: No results found for this or any previous visit (from the past 24 hour(s)).      Exercise Prescription Changes - 05/02/16 1500      Response to Exercise   Blood Pressure (Admit) 102/70   Blood Pressure (Exercise) 130/70   Blood Pressure (Exit) 98/78   Heart Rate (Admit) 89 bpm   Heart Rate (Exercise) 141 bpm   Heart Rate (Exit) 108 bpm   Oxygen Saturation (Admit) 96 %   Oxygen Saturation (Exercise) 94 %   Oxygen Saturation (Exit) 95 %   Rating of Perceived Exertion (Exercise) 13   Perceived Dyspnea (Exercise) 3   Duration Progress to 45 minutes of aerobic exercise without signs/symptoms of physical distress   Intensity --  40-80% of HRR     Progression   Progression Continue to progress workloads to maintain intensity without signs/symptoms of physical distress.     Resistance Training   Training Prescription Yes   Weight green bands   Reps 10-12  10 minutes of strength training     Interval Training   Interval Training No      Oxygen   Oxygen Continuous   Liters 4     Bike   Level 0.5   Minutes 17     NuStep   Level 2   Minutes 17   METs 1.5     Track   Laps 12   Minutes 17     Goals Met:  Exercise tolerated well Strength training completed today  Goals Unmet:  Not Applicable  Comments: Service time is from 1330 to 1510    Dr. Rush Farmer is Medical Director for Pulmonary Rehab at [facility].

## 2016-05-04 ENCOUNTER — Encounter (HOSPITAL_COMMUNITY)
Admission: RE | Admit: 2016-05-04 | Discharge: 2016-05-04 | Disposition: A | Discharge status: Home / Self Care | Payer: 59 | Source: Ambulatory Visit | Attending: Pulmonary Disease | Admitting: Pulmonary Disease

## 2016-05-04 ENCOUNTER — Other Ambulatory Visit: Payer: Self-pay | Admitting: Family Medicine

## 2016-05-04 VITALS — Wt 229.1 lb

## 2016-05-04 DIAGNOSIS — J984 Other disorders of lung: Secondary | ICD-10-CM

## 2016-05-04 NOTE — Progress Notes (Signed)
Daily Session Note  Patient Details  Name: Deborah [name] MRN: [id number] Date of Birth: [birth year] Referring Provider:   April Manson Pulmonary Rehab Walk Test from 04/20/2016 in [place]  Referring Provider  Dr. Elsworth Soho      Encounter Date: 05/04/2016  Check In:     Session Check In - 05/04/16 1328      Check-In   Location [facility]   Staff Present Su Hilt, MS, ACSM RCEP, Exercise Physiologist;Mariha Sleeper Leonia Reeves, RN, Luisa Hart, RN, BSN   Supervising physician immediately available to respond to emergencies [facility] immediately available   Physician(s) Dr. Rockne Menghini   Medication changes reported     No   Fall or balance concerns reported    No   Warm-up and Cool-down Performed as group-led instruction   Resistance Training Performed Yes   VAD Patient? No     Pain Assessment   Currently in Pain? No/denies   Multiple Pain Sites No      Capillary Blood Glucose: No results found for this or any previous visit (from the past 24 hour(s)).      Exercise Prescription Changes - 05/04/16 1600      Exercise Review   Progression Yes     Response to Exercise   Blood Pressure (Admit) 104/66   Blood Pressure (Exercise) 132/70   Blood Pressure (Exit) 110/62   Heart Rate (Admit) 89 bpm   Heart Rate (Exercise) 137 bpm   Heart Rate (Exit) 107 bpm   Oxygen Saturation (Admit) 92 %   Oxygen Saturation (Exercise) 95 %   Oxygen Saturation (Exit) 97 %   Rating of Perceived Exertion (Exercise) 13   Perceived Dyspnea (Exercise) 3   Duration Progress to 45 minutes of aerobic exercise without signs/symptoms of physical distress   Intensity THRR unchanged     Progression   Progression Continue to progress workloads to maintain intensity without signs/symptoms of physical distress.     Resistance Training   Training Prescription Yes   Weight green bands   Reps 10-12  10 minutes of strength training     Interval  Training   Interval Training No     Oxygen   Oxygen Continuous   Liters 4     NuStep   Level 3   Minutes 17   METs 1.6     Track   Laps 11   Minutes 17     Goals Met:  Exercise tolerated well Strength training completed today  Goals Unmet:  Not Applicable  Comments: Service time is from 1330 to 1515    Dr. Rush Farmer is Medical Director for Pulmonary Rehab at [facility].

## 2016-05-09 ENCOUNTER — Encounter (HOSPITAL_COMMUNITY)
Admission: RE | Admit: 2016-05-09 | Discharge: 2016-05-09 | Disposition: A | Discharge status: Home / Self Care | Payer: 59 | Source: Ambulatory Visit | Attending: Pulmonary Disease | Admitting: Pulmonary Disease

## 2016-05-09 VITALS — Wt 228.8 lb

## 2016-05-09 DIAGNOSIS — J984 Other disorders of lung: Secondary | ICD-10-CM

## 2016-05-09 NOTE — Progress Notes (Signed)
Pt completed Quality of Life survey as a participant in Pulmonary Rehab. Scores 21.0 or below are considered low. Pt score very low in several areas Overall 12.44, Health and Function 7.20, physiological and spiritual 12.93, family 16.13.   She is presently seeking psychological evaluation for depression.  She is hoping that along with pulmonary rehab will improve her quality of life.

## 2016-05-09 NOTE — Progress Notes (Signed)
Daily Session Note  Patient Details  Name: Deborah [name] MRN: [id number] Date of Birth: 04/24/1956 Referring Provider:   April Manson Pulmonary Rehab Walk Test from 04/20/2016 in [place]  Referring Provider  Dr. Elsworth Soho      Encounter Date: 05/09/2016  Check In:     Session Check In - 05/09/16 1331      Check-In   Location [facility]   Staff Present Su Hilt, MS, ACSM RCEP, Exercise Physiologist;Lillee Mooneyhan Leonia Reeves, RN, BSN;Lisa Hughes, RN;Portia Rollene Rotunda, RN, BSN   Supervising physician immediately available to respond to emergencies [facility] immediately available   Physician(s) Dr. Rockne Menghini   Medication changes reported     No   Fall or balance concerns reported    No   Warm-up and Cool-down Performed as group-led instruction   Resistance Training Performed Yes   VAD Patient? No     Pain Assessment   Currently in Pain? No/denies   Multiple Pain Sites No      Capillary Blood Glucose: No results found for this or any previous visit (from the past 24 hour(s)).      Exercise Prescription Changes - 05/09/16 1500      Exercise Review   Progression Yes     Response to Exercise   Blood Pressure (Admit) 102/60   Blood Pressure (Exercise) 100/70   Blood Pressure (Exit) 100/60   Heart Rate (Admit) 86 bpm   Heart Rate (Exercise) 114 bpm   Heart Rate (Exit) 93 bpm   Oxygen Saturation (Admit) 96 %   Oxygen Saturation (Exercise) 94 %   Oxygen Saturation (Exit) 93 %   Rating of Perceived Exertion (Exercise) 15   Perceived Dyspnea (Exercise) 3   Duration Progress to 45 minutes of aerobic exercise without signs/symptoms of physical distress   Intensity THRR unchanged     Progression   Progression Continue to progress workloads to maintain intensity without signs/symptoms of physical distress.     Resistance Training   Training Prescription Yes   Weight green bands   Reps 10-12  10 minutes of strength training      Interval Training   Interval Training No     Oxygen   Oxygen Continuous   Liters 4     Bike   Level 0.5   Minutes 17     NuStep   Level 4   Minutes 17   METs 1.7     Track   Laps 9   Minutes 17     Goals Met:  Exercise tolerated well Strength training completed today  Goals Unmet:  Not Applicable  Comments: Service time is from 1330 to 1500    Dr. Rush Farmer is Medical Director for Pulmonary Rehab at [facility].

## 2016-05-11 ENCOUNTER — Encounter (HOSPITAL_COMMUNITY)
Admission: RE | Admit: 2016-05-11 | Discharge: 2016-05-11 | Disposition: A | Discharge status: Home / Self Care | Payer: 59 | Source: Ambulatory Visit | Attending: Pulmonary Disease | Admitting: Pulmonary Disease

## 2016-05-11 VITALS — Wt 230.4 lb

## 2016-05-11 DIAGNOSIS — J984 Other disorders of lung: Secondary | ICD-10-CM | POA: Diagnosis not present

## 2016-05-11 NOTE — Progress Notes (Signed)
Daily Session Note  Patient Details  Name: Deborah [name] MRN: [id number] Date of Birth: May 13, 1956 Referring Provider:   April Manson Pulmonary Rehab Walk Test from 04/20/2016 in [place]  Referring Provider  Dr. Elsworth Soho      Encounter Date: 05/11/2016  Check In:     Session Check In - 05/11/16 1341      Check-In   [facility]   Staff Present Rosebud Poles, RN, BSN;Molly diVincenzo, MS, ACSM RCEP, Exercise Physiologist;Nashiya Disbrow Ysidro Evert, RN;Portia Rollene Rotunda, RN, BSN   Supervising physician immediately available to respond to emergencies [facility] immediately available   Physician(s) Dr. Alfredia Ferguson   Medication changes reported     No   Fall or balance concerns reported    No   Warm-up and Cool-down Performed as group-led instruction   Resistance Training Performed Yes   VAD Patient? No     Pain Assessment   Currently in Pain? No/denies   Multiple Pain Sites No      Capillary Blood Glucose: No results found for this or any previous visit (from the past 24 hour(s)).      Exercise Prescription Changes - 05/11/16 1600      Response to Exercise   Blood Pressure (Admit) 120/70   Blood Pressure (Exercise) 124/80   Blood Pressure (Exit) 100/66   Heart Rate (Admit) 116 bpm   Heart Rate (Exercise) 140 bpm   Heart Rate (Exit) 112 bpm   Oxygen Saturation (Admit) 95 %   Oxygen Saturation (Exercise) 92 %   Oxygen Saturation (Exit) 96 %   Rating of Perceived Exertion (Exercise) 12   Perceived Dyspnea (Exercise) 1   Duration Progress to 45 minutes of aerobic exercise without signs/symptoms of physical distress   Intensity THRR unchanged     Progression   Progression Continue to progress workloads to maintain intensity without signs/symptoms of physical distress.     Resistance Training   Training Prescription Yes   Weight green bands   Reps 10-12  10 minutes of strength training     Interval Training   Interval  Training No     Oxygen   Oxygen Continuous   Liters 4     Bike   Level 0.5   Minutes 17     Track   Laps 9   Minutes 17     Goals Met:  Exercise tolerated well No report of cardiac concerns or symptoms Strength training completed today  Goals Unmet:  Not Applicable  Comments: Service time is from 1330 to 1530 She attended answer and question with Dr.Yacoub today.   Dr. Rush Farmer is Medical Director for Pulmonary Rehab at [facility].

## 2016-05-16 ENCOUNTER — Encounter (HOSPITAL_COMMUNITY)
Admission: RE | Admit: 2016-05-16 | Discharge: 2016-05-16 | Disposition: A | Discharge status: Home / Self Care | Payer: 59 | Source: Ambulatory Visit | Attending: Pulmonary Disease | Admitting: Pulmonary Disease

## 2016-05-16 VITALS — Wt 229.1 lb

## 2016-05-16 DIAGNOSIS — J984 Other disorders of lung: Secondary | ICD-10-CM

## 2016-05-16 NOTE — Progress Notes (Signed)
Deborah [name] 60 y.o. female Nutrition Note Spoke with pt. Pt is obese. Pt c/o 40 lb wt gain over the past year due to decreased activity level. Pt wants to lose wt. Pt is trying to lose wt by increasing activity on her non-rehab days (e.g. Walking for 15 min). There are some ways the pt can make her eating habits healthier. Pt's Rate Your Plate results reviewed with pt. Pt avoids most salty food; uses low sodium canned food if she uses canned food. Pt does not add salt to food at the table. Pt is the primary cook in the family and her husband helps with grocery shopping. Pt was eating out frequently for convenience because pt gets tired sometimes when she cooks. Energy managing techniques with dinner preparation discussed. Pt is pre-diabetic according to her last A1c, which was almost 2 years ago. Pt states she is unaware of being pre-diabetic. Pt encouraged to talk with her PCP re: pre-diabetes. Pt expressed understanding of the information reviewed via feedback method.    Lab Results  Component Value Date   HGBA1C 6.2 (H) 07/25/2014    Nutrition Diagnosis ? Food-and nutrition-related knowledge deficit related to lack of exposure to information as related to diagnosis of pulmonary disease ? Obesity related to excessive energy intake as evidenced by a BMI of 40.3 ?  Nutrition Intervention ? Pt's individual nutrition plan and goals reviewed with pt. ? Benefits of adopting healthy eating habits discussed when pt's Rate Your Plate reviewed. ? Pt to attend the Nutrition and Lung Disease class ? Handout re: pre-diabetes given ? Continual client-centered nutrition education by RD, as part of interdisciplinary care. Goal(s) 1. Identify food quantities necessary to achieve wt loss of  -2# per week to a goal wt loss of 2.7-10.9 kg (6-24 lb) at graduation from pulmonary rehab. 2. Describe the benefit of including fruits, vegetables, whole grains, and low-fat dairy products in a healthy meal  plan. Monitor and Evaluate progress toward nutrition goal with team.   Derek Mound, M.Ed, RD, LDN, CDE 05/16/2016 2:58 PM

## 2016-05-16 NOTE — Progress Notes (Signed)
Daily Session Note  Patient Details  Name: Deborah [name] MRN: [id number] Date of Birth: 04/03/1956 Referring Provider:   April Manson Pulmonary Rehab Walk Test from 04/20/2016 in [place]  Referring Provider  Dr. Elsworth Soho      Encounter Date: 05/16/2016  Check In:     Session Check In - 05/16/16 1332      Check-In   Location [facility]   Staff Present Su Hilt, MS, ACSM RCEP, Exercise Physiologist;Joan Leonia Reeves, RN, Luisa Hart, RN, BSN   Supervising physician immediately available to respond to emergencies [facility] immediately available   Physician(s) Dr. Alfredia Ferguson   Medication changes reported     No   Fall or balance concerns reported    No   Warm-up and Cool-down Performed as group-led instruction   Resistance Training Performed Yes   VAD Patient? No     Pain Assessment   Currently in Pain? No/denies   Multiple Pain Sites No      Capillary Blood Glucose: No results found for this or any previous visit (from the past 24 hour(s)).      Exercise Prescription Changes - 05/16/16 1600      Response to Exercise   Blood Pressure (Admit) 114/64   Blood Pressure (Exercise) 130/80   Blood Pressure (Exit) 106/60   Heart Rate (Admit) 112 bpm   Heart Rate (Exercise) 121 bpm   Heart Rate (Exit) 113 bpm   Oxygen Saturation (Admit) 96 %   Oxygen Saturation (Exercise) 95 %   Oxygen Saturation (Exit) 94 %   Rating of Perceived Exertion (Exercise) 13   Perceived Dyspnea (Exercise) 1   Duration Progress to 45 minutes of aerobic exercise without signs/symptoms of physical distress   Intensity THRR unchanged     Progression   Progression Continue to progress workloads to maintain intensity without signs/symptoms of physical distress.     Resistance Training   Training Prescription Yes   Weight green bands   Reps 10-12  10 minutes of strength training     Interval Training   Interval Training No     Oxygen   Oxygen Continuous   Liters 4     Bike   Level 0.5   Minutes 17     NuStep   Level 4   Minutes 17   METs 2     Goals Met:  Exercise tolerated well No report of cardiac concerns or symptoms Strength training completed today  Goals Unmet:  Not Applicable  Comments: Service time is from 1330 to 1515    Dr. Rush Farmer is Medical Director for Pulmonary Rehab at [facility].

## 2016-05-18 ENCOUNTER — Encounter (HOSPITAL_COMMUNITY)
Admission: RE | Admit: 2016-05-18 | Discharge: 2016-05-18 | Disposition: A | Discharge status: Home / Self Care | Payer: 59 | Source: Ambulatory Visit | Attending: Pulmonary Disease | Admitting: Pulmonary Disease

## 2016-05-18 VITALS — Wt 231.5 lb

## 2016-05-18 DIAGNOSIS — I509 Heart failure, unspecified: Secondary | ICD-10-CM | POA: Insufficient documentation

## 2016-05-18 DIAGNOSIS — Z87891 Personal history of nicotine dependence: Secondary | ICD-10-CM | POA: Diagnosis not present

## 2016-05-18 DIAGNOSIS — J984 Other disorders of lung: Secondary | ICD-10-CM | POA: Diagnosis present

## 2016-05-18 DIAGNOSIS — Z7982 Long term (current) use of aspirin: Secondary | ICD-10-CM | POA: Diagnosis not present

## 2016-05-18 DIAGNOSIS — Z79899 Other long term (current) drug therapy: Secondary | ICD-10-CM | POA: Insufficient documentation

## 2016-05-18 DIAGNOSIS — Z7951 Long term (current) use of inhaled steroids: Secondary | ICD-10-CM | POA: Insufficient documentation

## 2016-05-18 DIAGNOSIS — I110 Hypertensive heart disease with heart failure: Secondary | ICD-10-CM | POA: Insufficient documentation

## 2016-05-18 DIAGNOSIS — Z6841 Body Mass Index (BMI) 40.0 and over, adult: Secondary | ICD-10-CM | POA: Insufficient documentation

## 2016-05-18 DIAGNOSIS — J449 Chronic obstructive pulmonary disease, unspecified: Secondary | ICD-10-CM | POA: Diagnosis not present

## 2016-05-18 DIAGNOSIS — E669 Obesity, unspecified: Secondary | ICD-10-CM | POA: Diagnosis not present

## 2016-05-18 NOTE — Progress Notes (Signed)
Daily Session Note  Patient Details  Name: Deborah [name] MRN: [id number] Date of Birth: 26-Jan-1956 Referring Provider:   April Manson Pulmonary Rehab Walk Test from 04/20/2016 in [place]  Referring Provider  Dr. Elsworth Soho      Encounter Date: 05/18/2016  Check In:     Session Check In - 05/18/16 1330      Check-In   Location [facility]   Staff Present Rosebud Poles, RN, BSN;Molly diVincenzo, MS, ACSM RCEP, Exercise Physiologist;Lisa Ysidro Evert, RN;Shadara Lopez Rollene Rotunda, RN, BSN   Supervising physician immediately available to respond to emergencies [facility] immediately available   Physician(s) Dr. Elder Love   Medication changes reported     No   Fall or balance concerns reported    No   Warm-up and Cool-down Performed as group-led instruction   Resistance Training Performed Yes   VAD Patient? No     Pain Assessment   Currently in Pain? No/denies   Multiple Pain Sites No      Capillary Blood Glucose: No results found for this or any previous visit (from the past 24 hour(s)).      Exercise Prescription Changes - 05/18/16 1624      Response to Exercise   Blood Pressure (Admit) 102/70   Blood Pressure (Exercise) 118/77   Blood Pressure (Exit) 102/72   Heart Rate (Admit) 82 bpm   Heart Rate (Exercise) 117 bpm   Heart Rate (Exit) 97 bpm   Oxygen Saturation (Admit) 94 %   Oxygen Saturation (Exercise) 95 %   Oxygen Saturation (Exit) 93 %   Rating of Perceived Exertion (Exercise) 12   Perceived Dyspnea (Exercise) 0   Duration Progress to 45 minutes of aerobic exercise without signs/symptoms of physical distress   Intensity THRR unchanged     Progression   Progression Continue to progress workloads to maintain intensity without signs/symptoms of physical distress.     Resistance Training   Training Prescription Yes   Weight green bands   Reps 10-12  10 minutes of strength training     Interval Training   Interval  Training No     Oxygen   Oxygen Continuous   Liters 4     Bike   Level 0.5   Minutes 17     NuStep   Level 4   Minutes 17   METs 2.1     Goals Met:  Exercise tolerated well Queuing for purse lip breathing No report of cardiac concerns or symptoms Strength training completed today  Goals Unmet:  Not Applicable  Comments: Service time is from 1330 to 1540   Dr. Rush Farmer is Medical Director for Pulmonary Rehab at [facility].

## 2016-05-19 ENCOUNTER — Other Ambulatory Visit: Payer: Self-pay | Admitting: Family Medicine

## 2016-05-19 ENCOUNTER — Telehealth: Payer: Self-pay | Admitting: Family Medicine

## 2016-05-19 DIAGNOSIS — F319 Bipolar disorder, unspecified: Secondary | ICD-10-CM

## 2016-05-19 NOTE — Telephone Encounter (Signed)
PatietnLVM that she finally went to walk in Psychiatric clinic today and they are not in her  [place]. Patient is requesting a Referral to a Psychiatrist who takes [facility]. Please place referral.

## 2016-05-22 NOTE — Telephone Encounter (Signed)
Noted  Referral placed.

## 2016-05-23 ENCOUNTER — Encounter (HOSPITAL_COMMUNITY)
Admission: RE | Admit: 2016-05-23 | Discharge: 2016-05-23 | Disposition: A | Discharge status: Home / Self Care | Payer: 59 | Source: Ambulatory Visit | Attending: Pulmonary Disease | Admitting: Pulmonary Disease

## 2016-05-23 VITALS — Wt 229.3 lb

## 2016-05-23 DIAGNOSIS — J984 Other disorders of lung: Secondary | ICD-10-CM | POA: Diagnosis not present

## 2016-05-23 NOTE — Progress Notes (Signed)
Pulmonary Individual Treatment Plan  Patient Details  Name: Deborah [name] MRN: [id number] Date of Birth: 1955/11/17 Referring Provider:   April Manson Pulmonary Rehab Walk Test from 04/20/2016 in [place]  Referring Provider  Dr. Elsworth Soho      Initial Encounter Date:  Flowsheet Row Pulmonary Rehab Walk Test from 04/20/2016 in [facility]  Date  04/20/16  Referring Provider  Dr. Elsworth Soho      Visit Diagnosis: Restrictive lung disease  Patient's Home Medications on Admission:   Current Outpatient Prescriptions:  .  albuterol (PROVENTIL HFA;VENTOLIN HFA) 108 (90 Base) MCG/ACT inhaler, Inhale 2 puffs into the lungs every 6 (six) hours as needed for wheezing or shortness of breath., Disp: 1 Inhaler, Rfl: 2 .  aspirin 81 MG chewable tablet, Chew 1 tablet (81 mg total) by mouth daily., Disp: 30 tablet, Rfl: 1 .  Aspirin-Caffeine (BC FAST PAIN RELIEF ARTHRITIS) 1000-65 MG PACK, Take 1 application by mouth as needed., Disp: , Rfl:  .  CARTIA XT 120 MG 24 hr capsule, TAKE ONE CAPSULE BY MOUTH DAILY, Disp: 30 capsule, Rfl: 3 .  fexofenadine (ALLEGRA) 30 MG tablet, Take 30 mg by mouth every other day., Disp: , Rfl:  .  fluticasone furoate-vilanterol (BREO ELLIPTA) 100-25 MCG/INH AEPB, Inhale 1 puff into the lungs daily., Disp: 1 each, Rfl: 5 .  furosemide (LASIX) 20 MG tablet, TAKE 2 TABLETS BY MOUTH EVERY MORNING AND 1 TABLET BY MOUTH EVERY EVENING, Disp: 45 tablet, Rfl: 5 .  montelukast (SINGULAIR) 10 MG tablet, TAKE 1 TABLET BY MOUTH EVERY NIGHT AT BEDTIME, Disp: 90 tablet, Rfl: 1 .  potassium chloride SA (K-DUR,KLOR-CON) 20 MEQ tablet, TAKE 1/2 TABLET(10 MEQ) BY MOUTH DAILY (Patient taking differently: Take 10 mEq by mouth daily. ), Disp: 45 tablet, Rfl: 3 .  vitamin B-12 (CYANOCOBALAMIN) 1000 MCG tablet, Take 1,000 mcg by mouth daily., Disp: , Rfl:   Past Medical History: Past Medical History:  Diagnosis Date  . Anemia    as  teen  . Anxiety   . Arthritis   . Asthma   . CHF (congestive heart failure) ([place])   . COPD, mild ([place])   . Depression   . Diverticulitis   . Family history of anesthesia complication    vomiting  . GI bleeding   . Heart failure ([place])    New onset 07/25/14  . Histoplasmosis    left eye  . Hyperkalemia   . Hypertension   . Obesity (BMI 30-39.9)   . Pneumonia    hx of  . PONV (postoperative nausea and vomiting)   . Restrictive lung disease   . Shortness of breath   . Tobacco abuse   . Umbilical hernia     Tobacco Use: History  Smoking Status  . Former Smoker  . Packs/day: 3.00  . Years: 43.00  . Types: Cigarettes  . Quit date: 12/17/2012  Smokeless Tobacco  . Never Used    Labs: Recent Review Flowsheet Data    Labs for ITP Cardiac and Pulmonary Rehab Latest Ref Rng & Units 03/29/2015 12/18/2015 12/18/2015 12/18/2015 12/18/2015   Cholestrol 0 - 200 mg/dL 200 - - - -   LDLCALC 0 - 99 mg/dL 136(H) - - - -   HDL >39.00 mg/dL 44.60 - - - -   Trlycerides 0.0 - 149.0 mg/dL 98.0 - - - -   Hemoglobin A1c <5.7 % - - - - -   PHART 7.350 - 7.450 - -  7.289(L) 7.253(L) 7.279(L)   PCO2ART 35.0 - 45.0 mmHg - - 74.8(HH) 85.4(HH) 77.8(HH)   HCO3 20.0 - 24.0 mEq/L - 35.6(H) 34.7(H) 36.4(H) 35.3(H)   TCO2 0 - 100 mmol/L - 31.6 31.2 33.2 32.0   O2SAT % - 87.5 90.5 92.7 93.0      Capillary Blood Glucose: Lab Results  Component Value Date   GLUCAP 117 (H) 03/22/2013   GLUCAP 108 (H) 03/22/2013   GLUCAP 112 (H) 03/21/2013   GLUCAP 117 (H) 03/21/2013   GLUCAP 108 (H) 03/21/2013     ADL UCSD:     Pulmonary Assessment Scores    Row Name 04/19/16 1656         ADL UCSD   ADL Phase Entry     SOB Score total 92        Pulmonary Function Assessment:     Pulmonary Function Assessment - 04/17/16 1316      Breath   Bilateral Breath Sounds Clear   Shortness of Breath Yes      Exercise Target Goals:    Exercise Program Goal: Individual exercise prescription set with THRR,  safety & activity barriers. Participant demonstrates ability to understand and report RPE using BORG scale, to self-measure pulse accurately, and to acknowledge the importance of the exercise prescription.  Exercise Prescription Goal: Starting with aerobic activity 30 plus minutes a day, 3 days per week for initial exercise prescription. Provide home exercise prescription and guidelines that participant acknowledges understanding prior to discharge.  Activity Barriers & Risk Stratification:     Activity Barriers & Cardiac Risk Stratification - 04/17/16 1313      Activity Barriers & Cardiac Risk Stratification   Activity Barriers Arthritis;Back Problems      6 Minute Walk:     6 Minute Walk    Row Name 04/20/16 1638         6 Minute Walk   Phase Initial     Distance 1355 feet     Walk Time 6 minutes     # of Rest Breaks 0     MPH 2.56     METS 2.91     RPE 15     Perceived Dyspnea  3     Symptoms No     Resting HR 107 bpm     Resting BP 94/64     Max Ex. HR 154 bpm     Max Ex. BP 124/76       Interval HR   Baseline HR 107     1 Minute HR 117     2 Minute HR 125     3 Minute HR 131     4 Minute HR 132     5 Minute HR 135     6 Minute HR 154     2 Minute Post HR 146     Interval Heart Rate? Yes       Interval Oxygen   Interval Oxygen? Yes     Baseline Oxygen Saturation % 97 %     Baseline Liters of Oxygen 4 L     1 Minute Oxygen Saturation % 96 %     1 Minute Liters of Oxygen 4 L     2 Minute Oxygen Saturation % 93 %     2 Minute Liters of Oxygen 4 L     3 Minute Oxygen Saturation % 92 %     3 Minute Liters of Oxygen 4 L     4 Minute  Oxygen Saturation % 93 %     4 Minute Liters of Oxygen 4 L     5 Minute Oxygen Saturation % 93 %     5 Minute Liters of Oxygen 4 L     6 Minute Oxygen Saturation % 92 %     6 Minute Liters of Oxygen 4 L     2 Minute Post Oxygen Saturation % 96 %     2 Minute Post Liters of Oxygen 4 L        Initial Exercise  Prescription:     Initial Exercise Prescription - 04/20/16 1600      Date of Initial Exercise RX and Referring Provider   Date 04/20/16   Referring Provider Dr. Elsworth Soho     Oxygen   Oxygen Continuous   Liters 4     Bike   Level 0.5   Minutes 17     NuStep   Level 2   Minutes 17   METs 1.6     Track   Laps 5   Minutes 17     Prescription Details   Frequency (times per week) 2   Duration Progress to 45 minutes of aerobic exercise without signs/symptoms of physical distress     Intensity   THRR 40-80% of Max Heartrate 65-131   Ratings of Perceived Exertion 11-13   Perceived Dyspnea 0-4     Progression   Progression Continue progressive overload as per policy without signs/symptoms or physical distress.     Resistance Training   Training Prescription Yes   Weight green bands   Reps 10-12      Perform Capillary Blood Glucose checks as needed.  Exercise Prescription Changes:     Exercise Prescription Changes    Row Name 05/02/16 1500 05/04/16 1600 05/09/16 1500 05/11/16 1600 05/16/16 1600     Exercise Review   Progression  - Yes Yes  -  -     Response to Exercise   Blood Pressure (Admit) 102/70 104/66 102/60 120/70 114/64   Blood Pressure (Exercise) 130/70 132/70 100/70 124/80 130/80   Blood Pressure (Exit) 98/78 110/62 100/60 100/66 106/60   Heart Rate (Admit) 89 bpm 89 bpm 86 bpm 116 bpm  pt forgot to take her medication today, will take. 112 bpm   Heart Rate (Exercise) 141 bpm 137 bpm 114 bpm 140 bpm 121 bpm   Heart Rate (Exit) 108 bpm 107 bpm 93 bpm 112 bpm 113 bpm   Oxygen Saturation (Admit) 96 % 92 % 96 % 95 % 96 %   Oxygen Saturation (Exercise) 94 % 95 % 94 % 92 % 95 %   Oxygen Saturation (Exit) 95 % 97 % 93 % 96 % 94 %   Rating of Perceived Exertion (Exercise) _0 Perceived Dyspnea (Exercise) _1 Duration Progress to 45 minutes of aerobic exercise without signs/symptoms of physical distress Progress to 45 minutes of aerobic  exercise without signs/symptoms of physical distress Progress to 45 minutes of aerobic exercise without signs/symptoms of physical distress Progress to 45 minutes of aerobic exercise without signs/symptoms of physical distress Progress to 45 minutes of aerobic exercise without signs/symptoms of physical distress   Intensity -  40-80% of HRR THRR unchanged THRR unchanged THRR unchanged THRR unchanged     Progression   Progression Continue to progress workloads to maintain intensity without signs/symptoms of physical distress. Continue to progress workloads to maintain intensity without signs/symptoms of  physical distress. Continue to progress workloads to maintain intensity without signs/symptoms of physical distress. Continue to progress workloads to maintain intensity without signs/symptoms of physical distress. Continue to progress workloads to maintain intensity without signs/symptoms of physical distress.     Resistance Training   Training Prescription _0    Weight _1    Reps 10-12  10 minutes of strength training 10-12  10 minutes of strength training 10-12  10 minutes of strength training 10-12  10 minutes of strength training 10-12  10 minutes of strength training     Interval Training   Interval Training _2      Oxygen   Oxygen _3    Liters _4 Bike   Level 0.5  - 0.5 0.5 0.5   Minutes 17  - _5 NuStep   Level _6 - 4   Minutes _7 - 17   METs 1.5 1.6 1.7  - 2     Track   Laps _8 -   Minutes _9 -   Row Name 05/18/16 1624             Response to Exercise   Blood Pressure (Admit) 102/70       Blood Pressure (Exercise) 118/77       Blood Pressure (Exit) 102/72       Heart Rate (Admit) 82 bpm       Heart Rate (Exercise) 117 bpm       Heart Rate (Exit) 97 bpm       Oxygen Saturation  (Admit) 94 %       Oxygen Saturation (Exercise) 95 %       Oxygen Saturation (Exit) 93 %       Rating of Perceived Exertion (Exercise) 12       Perceived Dyspnea (Exercise) 0       Duration Progress to 45 minutes of aerobic exercise without signs/symptoms of physical distress       Intensity THRR unchanged         Progression   Progression Continue to progress workloads to maintain intensity without signs/symptoms of physical distress.         Resistance Training   Training Prescription Yes       Weight green bands       Reps 10-12  10 minutes of strength training         Interval Training   Interval Training No         Oxygen   Oxygen Continuous       Liters 4         Bike   Level 0.5       Minutes 17         NuStep   Level 4       Minutes 17       METs 2.1          Exercise Comments:     Exercise Comments    Row Name 05/22/16 1030           Exercise Comments Patient is very open to increasing workloads. Will cont. to monitor and progress.           Discharge Exercise Prescription (Final Exercise Prescription Changes):  Exercise Prescription Changes - 05/18/16 1624      Response to Exercise   Blood Pressure (Admit) 102/70   Blood Pressure (Exercise) 118/77   Blood Pressure (Exit) 102/72   Heart Rate (Admit) 82 bpm   Heart Rate (Exercise) 117 bpm   Heart Rate (Exit) 97 bpm   Oxygen Saturation (Admit) 94 %   Oxygen Saturation (Exercise) 95 %   Oxygen Saturation (Exit) 93 %   Rating of Perceived Exertion (Exercise) 12   Perceived Dyspnea (Exercise) 0   Duration Progress to 45 minutes of aerobic exercise without signs/symptoms of physical distress   Intensity THRR unchanged     Progression   Progression Continue to progress workloads to maintain intensity without signs/symptoms of physical distress.     Resistance Training   Training Prescription Yes   Weight green bands   Reps 10-12  10 minutes of strength training     Interval Training    Interval Training No     Oxygen   Oxygen Continuous   Liters 4     Bike   Level 0.5   Minutes 17     NuStep   Level 4   Minutes 17   METs 2.1       Nutrition:  Target Goals: Understanding of nutrition guidelines, daily intake of sodium <1555m, cholesterol <2077m calories 30% from fat and 7% or less from saturated fats, daily to have 5 or more servings of fruits and vegetables.  Biometrics:     Pre Biometrics - 04/17/16 1314      Pre Biometrics   Grip Strength 29 kg       Nutrition Therapy Plan and Nutrition Goals:     Nutrition Therapy & Goals - 05/09/16 1039      Nutrition Therapy   Diet Low Sodium     Personal Nutrition Goals   Personal Goal #1 1-2 lb wt loss/week to a wt loss goal of 6-24 lb at graduation from [facility] and counsel regarding individualized specific dietary modifications aiming towards targeted core components such as weight, hypertension, lipid management, diabetes, heart failure and other comorbidities.   Expected Outcomes Short Term Goal: Understand basic principles of dietary content, such as calories, fat, sodium, cholesterol and nutrients.;Long Term Goal: Adherence to prescribed nutrition plan.      Nutrition Discharge: Rate Your Plate Scores:     Nutrition Assessments - 05/16/16 1457      Rate Your Plate Scores   Pre Score 44      Psychosocial: Target Goals: Acknowledge presence or absence of depression, maximize coping skills, provide positive support system. Participant is able to verbalize types and ability to use techniques and skills needed for reducing stress and depression.  Initial Review & Psychosocial Screening:     Initial Psych Review & Screening - 04/17/16 1321      Initial Review   Current issues with Current Depression;History of Depression     Family Dynamics   Good Support System? Yes     Barriers   Psychosocial barriers to participate in  program Psychosocial barriers identified (see note)  will contact Dr. ArDeborra MedinaCP for depression issues     Screening Interventions   Interventions Encouraged to exercise      Quality of Life Scores:     Quality of Life - 04/19/16 1657      Quality of Life Scores   Health/Function Pre 7.2 %   Socioeconomic  Pre 20 %   Psych/Spiritual Pre 12.93 %   Family Pre 16.13 %   GLOBAL Pre 12.44 %      PHQ-9: Recent Review Flowsheet Data    Depression screen [facility] 2/9 04/17/2016   Decreased Interest 3   Down, Depressed, Hopeless 3   PHQ - 2 Score 6   Altered sleeping 3   Tired, decreased energy 3   Change in appetite 2   Feeling bad or failure about yourself  3   Trouble concentrating 0   Moving slowly or fidgety/restless 0   Suicidal thoughts 1   PHQ-9 Score 18   Difficult doing work/chores Very difficult      Psychosocial Evaluation and Intervention:     Psychosocial Evaluation - 04/17/16 1323      Psychosocial Evaluation & Interventions   Interventions Physician referral   Comments Will contact Dr. [name] for depression issues   Continued Psychosocial Services Needed Yes      Psychosocial Re-Evaluation:     Psychosocial Re-Evaluation    [place] Name 05/15/16 1254             Psychosocial Re-Evaluation   Interventions Therapist referral;Encouraged to attend Pulmonary Rehabilitation for the exercise       Comments Had a reaction in the past 2 2 different antidepressants, is currently seeing a counselor for suicidal thoughts.       Continued Psychosocial Services Needed Yes         Education: Education Goals: Education classes will be provided on a weekly basis, covering required topics. Participant will state understanding/return demonstration of topics presented.  Learning Barriers/Preferences:     Learning Barriers/Preferences - 04/17/16 1313      Learning Barriers/Preferences   Learning Barriers None   Learning Preferences Pictoral;Verbal Instruction       Education Topics: Risk Factor Reduction:  -Group instruction that is supported by a PowerPoint presentation. Instructor discusses the definition of a risk factor, different risk factors for pulmonary disease, and how the heart and lungs work together.     Nutrition for Pulmonary Patient:  -Group instruction provided by PowerPoint slides, verbal discussion, and written materials to support subject matter. The instructor gives an explanation and review of healthy diet recommendations, which includes a discussion on weight management, recommendations for fruit and vegetable consumption, as well as protein, fluid, caffeine, fiber, sodium, sugar, and alcohol. Tips for eating when patients are short of breath are discussed.   Pursed Lip Breathing:  -Group instruction that is supported by demonstration and informational handouts. Instructor discusses the benefits of pursed lip and diaphragmatic breathing and detailed demonstration on how to preform both.     Oxygen Safety:  -Group instruction provided by PowerPoint, verbal discussion, and written material to support subject matter. There is an overview of "What is Oxygen" and "Why do we need it".  Instructor also reviews how to create a safe environment for oxygen use, the importance of using oxygen as prescribed, and the risks of noncompliance. There is a brief discussion on traveling with oxygen and resources the patient may utilize. Flowsheet Row PULMONARY REHAB OTHER RESPIRATORY from 05/18/2016 in [place]  Date  05/18/16  Educator  rn  Instruction Review Code  2- meets goals/outcomes      Oxygen Equipment:  -Group instruction provided by [employer] Staff utilizing handouts, written materials, and equipment demonstrations.   Signs and Symptoms:  -Group instruction provided by written material and verbal discussion to support subject matter. Warning  signs and symptoms of infection, stroke, and heart  attack are reviewed and when to call the physician/911 reinforced. Tips for preventing the spread of infection discussed.   Advanced Directives:  -Group instruction provided by verbal instruction and written material to support subject matter. Instructor reviews Advanced Directive laws and proper instruction for filling out document.   Pulmonary Video:  -Group video education that reviews the importance of medication and oxygen compliance, exercise, good nutrition, pulmonary hygiene, and pursed lip and diaphragmatic breathing for the pulmonary patient. Flowsheet Row PULMONARY REHAB OTHER RESPIRATORY from 05/18/2016 in [place]  Date  05/04/16  Educator  video  Instruction Review Code  2- meets goals/outcomes      Exercise for the Pulmonary Patient:  -Group instruction that is supported by a PowerPoint presentation. Instructor discusses benefits of exercise, core components of exercise, frequency, duration, and intensity of an exercise routine, importance of utilizing pulse oximetry during exercise, safety while exercising, and options of places to exercise outside of rehab.     Pulmonary Medications:  -Verbally interactive group education provided by instructor with focus on inhaled medications and proper administration.   Anatomy and Physiology of the Respiratory System and Intimacy:  -Group instruction provided by PowerPoint, verbal discussion, and written material to support subject matter. Instructor reviews respiratory cycle and anatomical components of the respiratory system and their functions. Instructor also reviews differences in obstructive and restrictive respiratory diseases with examples of each. Intimacy, Sex, and Sexuality differences are reviewed with a discussion on how relationships can change when diagnosed with pulmonary disease. Common sexual concerns are reviewed.   Knowledge Questionnaire Score:     Knowledge Questionnaire Score  - 04/19/16 1652      Knowledge Questionnaire Score   Pre Score 9/13      Core Components/Risk Factors/Patient Goals at Admission:     Personal Goals and Risk Factors at Admission - 04/17/16 1317      Core Components/Risk Factors/Patient Goals on Admission    Weight Management Yes;Obesity   Intervention Weight Management: Develop a combined nutrition and exercise program designed to reach desired caloric intake, while maintaining appropriate intake of nutrient and fiber, sodium and fats, and appropriate energy expenditure required for the weight goal.;Weight Management: Provide education and appropriate resources to help participant work on and attain dietary goals.;Weight Management/Obesity: Establish reasonable short term and long term weight goals.;Obesity: Provide education and appropriate resources to help participant work on and attain dietary goals.   Admit Weight 230 lb 6.1 oz (104.5 kg)   Goal Weight: Short Term 225 lb (102.1 kg)   Expected Outcomes Short Term: Continue to assess and modify interventions until short term weight is achieved   Increase Strength and Stamina Yes   Intervention Provide advice, education, support and counseling about physical activity/exercise needs.;Develop an individualized exercise prescription for aerobic and resistive training based on initial evaluation findings, risk stratification, comorbidities and participant's personal goals.   Expected Outcomes Achievement of increased cardiorespiratory fitness and enhanced flexibility, muscular endurance and strength shown through measurements of functional capacity and personal statement of participant.   Improve shortness of breath with ADL's Yes   Intervention Provide education, individualized exercise plan and daily activity instruction to help decrease symptoms of SOB with activities of daily living.   Expected Outcomes Short Term: Achieves a reduction of symptoms when performing activities of daily living.       Core Components/Risk Factors/Patient Goals Review:      Goals and Risk Factor  Review    Row Name 05/15/16 1251             Core Components/Risk Factors/Patient Goals Review   Personal Goals Review Weight Management/Obesity;Improve shortness of breath with ADL's;Increase Strength and Stamina       Review Has not lost any weight, is tolerating exercise well, beginning to increase workloads.          Core Components/Risk Factors/Patient Goals at Discharge (Final Review):      Goals and Risk Factor Review - 05/15/16 1251      Core Components/Risk Factors/Patient Goals Review   Personal Goals Review Weight Management/Obesity;Improve shortness of breath with ADL's;Increase Strength and Stamina   Review Has not lost any weight, is tolerating exercise well, beginning to increase workloads.      ITP Comments:   Comments: ITP REVIEW Pt is making expected progress toward pulmonary rehab goals after completing 6 sessions. Recommend continued exercise, life style modification, education, and utilization of breathing techniques to increase stamina and strength and decrease shortness of breath with exertion.

## 2016-05-23 NOTE — Progress Notes (Signed)
Daily Session Note  Patient Details  Name: Deborah [name] MRN: [id number] Date of Birth: Apr 17, 1956 Referring Provider:   April Manson Pul[facility] Rehab Walk Test from 04/20/2016 in [place]  Referring Provider  Dr. Elsworth Soho      Encounter Date: 05/23/2016  Check In:     Session Check In - 05/23/16 1532      Check-In   Location [facility]   Staff Present Rosebud Poles, RN, BSN;Molly diVincenzo, MS, ACSM RCEP, Exercise Physiologist;Portia Rollene Rotunda, RN, BSN;Aidyn Sportsman Ysidro Evert, Felipe Drone, RN, [facility]   Supervising physician immediately available to respond to emergencies [facility] immediately available   Physician(s) Dr. Lonny Prude   Medication changes reported     No   Fall or balance concerns reported    No   Warm-up and Cool-down Performed as group-led instruction   Resistance Training Performed Yes   VAD Patient? No     Pain Assessment   Currently in Pain? No/denies   Multiple Pain Sites No      Capillary Blood Glucose: No results found for this or any previous visit (from the past 24 hour(s)).      Exercise Prescription Changes - 05/23/16 1600      Exercise Review   Progression Yes     Response to Exercise   Blood Pressure (Admit) 122/76   Blood Pressure (Exercise) 106/70   Blood Pressure (Exit) 100/64   Heart Rate (Admit) 111 bpm   Heart Rate (Exercise) 151 bpm   Heart Rate (Exit) 78 bpm   Oxygen Saturation (Admit) 95 %   Oxygen Saturation (Exercise) 94 %   Oxygen Saturation (Exit) 97 %   Rating of Perceived Exertion (Exercise) 15   Perceived Dyspnea (Exercise) 2   Duration Progress to 45 minutes of aerobic exercise without signs/symptoms of physical distress   Intensity THRR unchanged     Progression   Progression Continue to progress workloads to maintain intensity without signs/symptoms of physical distress.     Resistance Training   Training Prescription Yes   Weight green bands   Reps 10-12  10  minutes of strength training     Interval Training   Interval Training No     Oxygen   Oxygen Continuous   Liters 4     Bike   Level 0.5   Minutes 17     NuStep   Level 5   Minutes 17   METs 1.5     Track   Laps 11   Minutes 17     Goals Met:  Exercise tolerated well No report of cardiac concerns or symptoms Strength training completed today  Goals Unmet:  Not Applicable  Comments: Service time is from 1330 to 1510     Dr. Rush Farmer is Medical Director for Pulmonary Rehab at [facility].

## 2016-05-24 ENCOUNTER — Encounter: Payer: Self-pay | Admitting: Acute Care

## 2016-05-24 ENCOUNTER — Ambulatory Visit (INDEPENDENT_AMBULATORY_CARE_PROVIDER_SITE_OTHER): Payer: 59 | Admitting: Acute Care

## 2016-05-24 ENCOUNTER — Ambulatory Visit (INDEPENDENT_AMBULATORY_CARE_PROVIDER_SITE_OTHER)
Admission: RE | Admit: 2016-05-24 | Discharge: 2016-05-24 | Disposition: A | Discharge status: Home / Self Care | Payer: 59 | Source: Ambulatory Visit | Attending: Acute Care | Admitting: Acute Care

## 2016-05-24 ENCOUNTER — Other Ambulatory Visit: Payer: Self-pay | Admitting: Acute Care

## 2016-05-24 VITALS — BP 116/82 | HR 107 | Temp 97.5°F | Ht 63.0 in | Wt 229.6 lb

## 2016-05-24 DIAGNOSIS — R05 Cough: Secondary | ICD-10-CM | POA: Diagnosis not present

## 2016-05-24 DIAGNOSIS — G4733 Obstructive sleep apnea (adult) (pediatric): Secondary | ICD-10-CM

## 2016-05-24 DIAGNOSIS — J189 Pneumonia, unspecified organism: Secondary | ICD-10-CM | POA: Diagnosis not present

## 2016-05-24 DIAGNOSIS — R059 Cough, unspecified: Secondary | ICD-10-CM

## 2016-05-24 MED ORDER — DOXYCYCLINE HYCLATE 100 MG PO TABS: 100.0000 mg | ORAL_TABLET | Freq: Two times a day (BID) | ORAL | 0 refills | Status: DC

## 2016-05-24 MED ORDER — PREDNISONE 10 MG PO TABS: ORAL_TABLET | ORAL | 0 refills | Status: DC

## 2016-05-24 MED ORDER — ALBUTEROL SULFATE (2.5 MG/3ML) 0.083% IN NEBU: 2.5000 mg | INHALATION_SOLUTION | RESPIRATORY_TRACT | 1 refills | Status: DC | PRN

## 2016-05-24 MED ORDER — IPRATROPIUM-ALBUTEROL 0.5-2.5 (3) MG/3ML IN SOLN
3.0000 mL | Freq: Four times a day (QID) | RESPIRATORY_TRACT | 1 refills | Status: DC | PRN
Start: 2016-05-24 — End: 2016-05-24

## 2016-05-24 NOTE — Progress Notes (Signed)
History of Present Illness Deborah [name] is a 60 y.o. female former smoker ( 129 pack years) with PMH of obesity, mild COPD, OSA and CHF. She is being treated with CPAP.She wears home oxygen at 2 L [place].  She is followed by Dr. Elsworth Soho.   05/24/2016 Acute OV: Pt. Presents to the office today with complaint of rattling in chest and cough x 1 day. She has post nasal gtt.She is coughing up clear secretions but she states that they are " hot" not body temperature.She has a slight sore throat that she thinks is from her increased cough. She states she has not had any swelling and she is compliant with her lasix.She does have a slight fever per her history.She states she has some drainage down the back of her throat. She states she has presented early as she has required admission to the hospital when she has waited.She denies chest pain, orthopnea or hemoptysis.  Tests:   CXR: 05/24/2016 Mild left base subsegmental atelectasis. Mild left base infiltrate cannot be excluded.  SIGNIFICANT EVENTS / STUDIES:  05/2013 PFTs- normal ratio, moderate restriction, FVC 54%, FEV1 56%, small airways with significant bronchodilator response, DLCO 60% 1/10 ECHO >>nml LV systolic fxn, dilated hypocontractile RV c/w cor pulmonale 1/12 VQ negative for PE.  1/13 ACE inhibitor changed to ARB and steriods increased yesterday and now wheezing better.   08/2014 PSG - 216 lbs -AHI 7/h, lowest desatn 82%  Past medical hx Past Medical History:  Diagnosis Date  . Anemia    as teen  . Anxiety   . Arthritis   . Asthma   . CHF (congestive heart failure) ([place])   . COPD, mild ([place])   . Depression   . Diverticulitis   . Family history of anesthesia complication    vomiting  . GI bleeding   . Heart failure ([place])    New onset 07/25/14  . Histoplasmosis    left eye  . Hyperkalemia   . Hypertension   . Obesity (BMI 30-39.9)   . Pneumonia    hx of  . PONV (postoperative nausea and vomiting)   . Restrictive  lung disease   . Shortness of breath   . Tobacco abuse   . Umbilical hernia      Past surgical hx, Family hx, Social hx all reviewed.  Current Outpatient Prescriptions on File Prior to Visit  Medication Sig  . albuterol (PROVENTIL HFA;VENTOLIN HFA) 108 (90 Base) MCG/ACT inhaler Inhale 2 puffs into the lungs every 6 (six) hours as needed for wheezing or shortness of breath.  Marland Kitchen aspirin 81 MG chewable tablet Chew 1 tablet (81 mg total) by mouth daily.  . Aspirin-Caffeine (BC FAST PAIN RELIEF ARTHRITIS) 1000-65 MG PACK Take 1 application by mouth as needed.  Marland Kitchen CARTIA XT 120 MG 24 hr capsule TAKE ONE CAPSULE BY MOUTH DAILY  . fexofenadine (ALLEGRA) 30 MG tablet Take 30 mg by mouth every other day.  . fluticasone furoate-vilanterol (BREO ELLIPTA) 100-25 MCG/INH AEPB Inhale 1 puff into the lungs daily.  . furosemide (LASIX) 20 MG tablet TAKE 2 TABLETS BY MOUTH EVERY MORNING AND 1 TABLET BY MOUTH EVERY EVENING  . montelukast (SINGULAIR) 10 MG tablet TAKE 1 TABLET BY MOUTH EVERY NIGHT AT BEDTIME  . potassium chloride SA (K-DUR,KLOR-CON) 20 MEQ tablet TAKE 1/2 TABLET(10 MEQ) BY MOUTH DAILY (Patient taking differently: Take 10 mEq by mouth daily. )  . vitamin B-12 (CYANOCOBALAMIN) 1000 MCG tablet Take 1,000 mcg by mouth daily.  No current facility-administered medications on file prior to visit.      No Known Allergies  Review Of Systems:  Constitutional:   No  weight loss, night sweats, + Fevers, chills, fatigue, or  lassitude.  HEENT:   No headaches,  Difficulty swallowing,  Tooth/dental problems, or  Sore throat,                No sneezing, itching, ear ache, nasal congestion, post nasal drip,   CV:  No chest pain,  Orthopnea, PND, swelling in lower extremities, anasarca, dizziness, palpitations, syncope.   GI  No heartburn, indigestion, abdominal pain, nausea, vomiting, diarrhea, change in bowel habits, loss of appetite, bloody stools.   Resp: + shortness of breath with exertion less  at rest.  + excess mucus, + productive cough,  No non-productive cough,  No coughing up of blood.  + change in color of mucus.  + wheezing.  No chest wall deformity  Skin: no rash or lesions.  GU: no dysuria, change in color of urine, no urgency or frequency.  No flank pain, no hematuria   MS:  No joint pain or swelling.  No decreased range of motion.  No back pain.  Psych:  No change in mood or affect. No depression or anxiety.  No memory loss.   Vital Signs BP 116/82 (BP Location: Left Arm, Patient Position: Sitting, Cuff Size: Normal)   Pulse (!) 107   Temp 97.5 F (36.4 C) (Oral)   Ht '5\' 3"'$  (1.6 m)   Wt 229 lb 9.6 oz (104.1 kg)   SpO2 96%   BMI 40.67 kg/m    Physical Exam:  General- No distress,  A&Ox3, pleasant ENT: No sinus tenderness, TM clear, pale nasal mucosa, no oral exudate,no post nasal drip, no LAN Cardiac: S1, S2, regular rate and rhythm, no murmur Chest: No wheeze/ rales/ + dullness L>R; no accessory muscle use, no nasal flaring, no sternal retractions, cough Abd.: Soft Non-tender, obese Ext: No clubbing cyanosis, trace  edema Neuro:  normal strength Skin: No rashes, warm and dry Psych: normal mood and behavior   Assessment/Plan  CAP (community acquired pneumonia) COPD Exacerbation with early infiltrate on CXR Plan: We will send in a prescription for Prednisone taper; 10 mg tablets: 4 tabs x 2 days, 3 tabs x 2 days, 2 tabs x 2 days 1 tab x 2 days then stop. Doxycycline 100 mg twice a day x 7 days. Take Probiotic with antibiotic. Continue your Breo once daily Rinse mouth after use.. Mucinex 600 mg twice daily for chest congestion. Continue your rescue inhaler as needed.  We will re-order your proventil and duoneb nebulizer treatments. Hold on Pulmonary Rehab x 1 week. We will give you a note.  albuterol (2.5 MG/3ML) 0.083% nebulizer solution  Commonly known as:  PROVENTIL  Take 3 mLs (2.5 mg total) by nebulization every 3 (three) hours as needed  for wheezing or shortness of breath.   ipratropium-albuterol 0.5-2.5 (3) MG/3ML Soln  Commonly known as:  DUONEB  Take 3 mLs by nebulization every 6 (six) hours.   Follow up in 2-3  weeks with Dr. Elsworth Soho or NP. Please contact office for sooner follow up if symptoms do not improve or worsen or seek emergency care     OSA (obstructive sleep apnea) Compliant with CPAP Plan: Continue on CPAP at bedtime. You appear to be benefiting from the treatment Goal is to wear for at least 4-6 hours each night for maximal clinical benefit. Continue to  work on weight loss, as the link between excess weight  and sleep apnea is well established.  Do not drive if sleepy. Follow up with Dr. Elsworth Soho 08/2016  or before as needed.     [name], NP 05/24/2016  3:41 PM

## 2016-05-24 NOTE — Assessment & Plan Note (Signed)
Compliant with CPAP Plan: Continue on CPAP at bedtime. You appear to be benefiting from the treatment Goal is to wear for at least 4-6 hours each night for maximal clinical benefit. Continue to work on weight loss, as the link between excess weight  and sleep apnea is well established.  Do not drive if sleepy. Follow up with Dr. Elsworth Soho 08/2016  or before as needed.

## 2016-05-24 NOTE — Patient Instructions (Addendum)
It is nice to meet you today. Your CXR indicates an early infiltrate which could lead to pneumonia. We will send in a prescription for Prednisone taper; 10 mg tablets: 4 tabs x 2 days, 3 tabs x 2 days, 2 tabs x 2 days 1 tab x 2 days then stop. Doxycycline 100 mg twice a day x 7 days. Take Probiotic with antibiotic. Continue your Breo once daily Rinse mouth after use.. Continue your rescue inhaler as needed.  We will re-order your proventil and duoneb nebulizer treatments. Hold on Pulmonary Rehab x 1 week. We will give you a note.  albuterol (2.5 MG/3ML) 0.083% nebulizer solution  Commonly known as:  PROVENTIL  Take 3 mLs (2.5 mg total) by nebulization every 3 (three) hours as needed for wheezing or shortness of breath.   ipratropium-albuterol 0.5-2.5 (3) MG/3ML Soln  Commonly known as:  DUONEB  Take 3 mLs by nebulization every 6 (six) hours.   Follow up in 2-3  weeks with Dr. Elsworth Soho or NP. Please contact office for sooner follow up if symptoms do not improve or worsen or seek emergency care

## 2016-05-24 NOTE — Assessment & Plan Note (Addendum)
COPD Exacerbation with early infiltrate on CXR Plan: We will send in a prescription for Prednisone taper; 10 mg tablets: 4 tabs x 2 days, 3 tabs x 2 days, 2 tabs x 2 days 1 tab x 2 days then stop. Doxycycline 100 mg twice a day x 7 days. Take Probiotic with antibiotic. Continue your Breo once daily Rinse mouth after use.. Mucinex 600 mg twice daily for chest congestion. Continue your rescue inhaler as needed.  We will re-order your proventil and duoneb nebulizer treatments. Hold on Pulmonary Rehab x 1 week. We will give you a note.  albuterol (2.5 MG/3ML) 0.083% nebulizer solution  Commonly known as:  PROVENTIL  Take 3 mLs (2.5 mg total) by nebulization every 3 (three) hours as needed for wheezing or shortness of breath.   ipratropium-albuterol 0.5-2.5 (3) MG/3ML Soln  Commonly known as:  DUONEB  Take 3 mLs by nebulization every 6 (six) hours.   Follow up in 2-3  weeks with Dr. Elsworth Soho or NP. Please contact office for sooner follow up if symptoms do not improve or worsen or seek emergency care

## 2016-05-24 NOTE — Addendum Note (Signed)
Addended by: Benson Setting L on: 05/24/2016 04:14 PM   Modules accepted: Orders

## 2016-05-25 ENCOUNTER — Encounter (HOSPITAL_COMMUNITY): Payer: 59

## 2016-05-25 NOTE — Progress Notes (Signed)
Reviewed & agree with plan  

## 2016-05-26 ENCOUNTER — Ambulatory Visit (INDEPENDENT_AMBULATORY_CARE_PROVIDER_SITE_OTHER): Payer: 59 | Admitting: Cardiovascular Disease

## 2016-05-26 ENCOUNTER — Encounter: Payer: Self-pay | Admitting: Cardiovascular Disease

## 2016-05-26 VITALS — BP 110/70 | HR 90 | Ht 63.0 in | Wt 231.2 lb

## 2016-05-26 DIAGNOSIS — I5032 Chronic diastolic (congestive) heart failure: Secondary | ICD-10-CM | POA: Diagnosis not present

## 2016-05-26 DIAGNOSIS — I10 Essential (primary) hypertension: Secondary | ICD-10-CM

## 2016-05-26 NOTE — Patient Instructions (Signed)
Medication Instructions: Continue same medications.   Labwork: None.   Procedures/Testing: None.   Follow-Up: 6 months with Dr. Erich Kochan.   Any Additional Special Instructions Will Be Listed Below (If Applicable).     If you need a refill on your cardiac medications before your next appointment, please call your pharmacy.   

## 2016-05-26 NOTE — Progress Notes (Signed)
Cardiology Office Note   Date:  05/26/2016   ID:  [name], [name] February 01, 1956, MRN [id number]  PCP:  [name], MD  Cardiologist:   Kathlyn Sacramento, MD   Chief Complaint  Patient presents with  . other    6 month follow up. Pt. c/o heaviness in chest yesterday with vomiting. Meds reviewed by the pt. verbally.       History of Present Illness: Deborah [name] is a 60 y.o. female who presents for a follow-up visit regarding chronic diastolic heart failure and chronic right heart failure due to COPD. She has known history of tobacco use with COPD, essential Htn, Obesity and bipolar disorder.  She was hospitalized in January, 2016 for CHF.  She had moderate size left pleural effusion. Echocardiogram showed an ejection fraction of 77-82%, grade 1 diastolic dysfunction, dilated right ventricle with hypokinesis and mild pulmonary hypertension.    She quit smoking completely since then . She continues to be on home oxygen. She has known history of sinus tachycardia that was controlled with diltiazem. She reports occasional high heart rate readings at pulmonary rehabilitation. During last visit, I discontinued losartan due to low blood pressure.   Past Medical History:  Diagnosis Date  . Anemia    as teen  . Anxiety   . Arthritis   . Asthma   . CHF (congestive heart failure) ([place])   . COPD, mild ([place])   . Depression   . Diverticulitis   . Family history of anesthesia complication    vomiting  . GI bleeding   . Heart failure ([place])    New onset 07/25/14  . Histoplasmosis    left eye  . Hyperkalemia   . Hypertension   . Obesity (BMI 30-39.9)   . Pneumonia    hx of  . PONV (postoperative nausea and vomiting)   . Restrictive lung disease   . Shortness of breath   . Tobacco abuse   . Umbilical hernia     Past Surgical History:  Procedure Laterality Date  . APPLICATION OF WOUND VAC  03/19/2013   Procedure: APPLICATION OF WOUND VAC;  Surgeon: Madilyn Hook, DO;  Location:  [facility];  Service: General;;  . CESAREAN SECTION  04/12/85  . INSERTION OF MESH N/A 03/19/2013   Procedure: INSERTION OF MESH;  Surgeon: Madilyn Hook, DO;  Location: [facility];  Service: General;  Laterality: N/A;  . VENTRAL HERNIA REPAIR N/A 03/19/2013   Procedure:  OPEN VENTRAL HERNIA REPAIR WITH MESH AND APPLICATION OF WOUND VAC;  Surgeon: Madilyn Hook, DO;  Location: [facility];  Service: General;  Laterality: N/A;     Current Outpatient Prescriptions  Medication Sig Dispense Refill  . albuterol (PROVENTIL HFA;VENTOLIN HFA) 108 (90 Base) MCG/ACT inhaler Inhale 2 puffs into the lungs every 6 (six) hours as needed for wheezing or shortness of breath. 1 Inhaler 2  . albuterol (PROVENTIL) (2.5 MG/3ML) 0.083% nebulizer solution Take 3 mLs (2.5 mg total) by nebulization every 3 (three) hours as needed for wheezing or shortness of breath. 75 mL 1  . aspirin 81 MG chewable tablet Chew 1 tablet (81 mg total) by mouth daily. 30 tablet 1  . Aspirin-Caffeine (BC FAST PAIN RELIEF ARTHRITIS) 1000-65 MG PACK Take 1 application by mouth as needed.    Marland Kitchen CARTIA XT 120 MG 24 hr capsule TAKE ONE CAPSULE BY MOUTH DAILY 30 capsule 3  . doxycycline (VIBRA-TABS) 100 MG tablet Take 1 tablet (100 mg total) by mouth 2 (  two) times daily. 14 tablet 0  . fexofenadine (ALLEGRA) 30 MG tablet Take 30 mg by mouth every other day.    . fluticasone furoate-vilanterol (BREO ELLIPTA) 100-25 MCG/INH AEPB Inhale 1 puff into the lungs daily. 1 each 5  . furosemide (LASIX) 20 MG tablet TAKE 2 TABLETS BY MOUTH EVERY MORNING AND 1 TABLET BY MOUTH EVERY EVENING 45 tablet 5  . ipratropium-albuterol (DUONEB) 0.5-2.5 (3) MG/3ML SOLN Take 3 mLs by nebulization every 6 (six) hours as needed. 360 mL 1  . montelukast (SINGULAIR) 10 MG tablet TAKE 1 TABLET BY MOUTH EVERY NIGHT AT BEDTIME 90 tablet 1  . potassium chloride SA (K-DUR,KLOR-CON) 20 MEQ tablet TAKE 1/2 TABLET(10 MEQ) BY MOUTH DAILY (Patient taking differently: Take 10 mEq by mouth daily. ) 45  tablet 3  . predniSONE (DELTASONE) 10 MG tablet 4 tabs for 2 days, 3 tabs for 2 days 2 tabs for 2 days, 1 tab for 2 days then stop 20 tablet 0  . vitamin B-12 (CYANOCOBALAMIN) 1000 MCG tablet Take 1,000 mcg by mouth daily.     No current facility-administered medications for this visit.     Allergies:   Patient has no known allergies.    Social History:  The patient  reports that she quit smoking about 3 years ago. Her smoking use included Cigarettes. She has a 129.00 pack-year smoking history. She has never used smokeless tobacco. She reports that she does not drink alcohol or use drugs.   Family History:  The patient's family history includes Emphysema in her mother; Heart attack in her mother; Lymphoma in her maternal uncle; Pancreatic cancer in her maternal aunt.    ROS:  Please see the history of present illness.   Otherwise, review of systems are positive for none.   All other systems are reviewed and negative.    PHYSICAL EXAM: VS:  BP 110/70 (BP Location: Left Arm, Patient Position: Sitting, Cuff Size: Large)   Pulse 90   Ht '5\' 3"'$  (1.6 m)   Wt 231 lb 4 oz (104.9 kg)   BMI 40.96 kg/m  , BMI Body mass index is 40.96 kg/m. GEN: Well nourished, well developed, in no acute distress  HEENT: normal  Neck: no JVD, carotid bruits, or masses Cardiac: RRR; no murmurs, rubs, or gallops,no edema  Respiratory:  clear to auscultation bilaterally, normal work of breathing GI: soft, nontender, nondistended, + BS MS: no deformity or atrophy  Skin: warm and dry, no rash Neuro:  Strength and sensation are intact Psych: euthymic mood, full affect   EKG:  EKG is ordered today. The ekg ordered today demonstrates normal sinus rhythm with low voltage.   Recent Labs: 12/18/2015: B Natriuretic Peptide 166.0 12/19/2015: Magnesium 2.1 01/07/2016: ALT 28 01/08/2016: BUN 20; Creatinine, Ser 0.87; Potassium 3.7; Sodium 140 01/20/2016: Hemoglobin 13.1; Platelets 270.0    Lipid Panel    Component  Value Date/Time   CHOL 200 03/29/2015 1216   TRIG 98.0 03/29/2015 1216   HDL 44.60 03/29/2015 1216   CHOLHDL 4 03/29/2015 1216   VLDL 19.6 03/29/2015 1216   LDLCALC 136 (H) 03/29/2015 1216      Wt Readings from Last 3 Encounters:  05/26/16 231 lb 4 oz (104.9 kg)  05/24/16 229 lb 9.6 oz (104.1 kg)  05/23/16 229 lb 4.5 oz (104 kg)        ASSESSMENT AND PLAN:  1.  Chronic diastolic heart failure: She appears to be euvolemic on current dose of furosemide.  She continues to  have intermittent sinus tachycardia likely driven by underlying COPD. Continue same dose diltiazem. I did not increase the dose given relatively low blood pressure.  2. Essential hypertension: Blood pressure  is controlled on diltiazem. Losartan was discontinued to visit.   Disposition:   FU with me in 6 months  Signed,  Kathlyn Sacramento, MD  05/26/2016 3:33 PM    [facility]

## 2016-05-30 ENCOUNTER — Encounter (HOSPITAL_COMMUNITY): Payer: 59

## 2016-05-31 ENCOUNTER — Encounter (HOSPITAL_COMMUNITY): Payer: Self-pay | Admitting: *Deleted

## 2016-06-01 ENCOUNTER — Encounter (HOSPITAL_COMMUNITY): Payer: Self-pay | Admitting: *Deleted

## 2016-06-01 ENCOUNTER — Encounter (HOSPITAL_COMMUNITY): Payer: 59

## 2016-06-01 NOTE — Progress Notes (Signed)
Patient reported recent pneumonia diagnosis as of 05/27/16.  Seen by pulmonology Info in EPIC along with CXR.  Instructed patient to call office of Dr Hilarie Fredrickson and let them be aware.  Patient voiced understanding.

## 2016-06-06 ENCOUNTER — Telehealth (HOSPITAL_COMMUNITY): Payer: Self-pay | Admitting: Family Medicine

## 2016-06-06 ENCOUNTER — Encounter (HOSPITAL_COMMUNITY): Payer: 59

## 2016-06-08 ENCOUNTER — Encounter (HOSPITAL_COMMUNITY): Payer: 59

## 2016-06-13 ENCOUNTER — Ambulatory Visit (HOSPITAL_COMMUNITY)
Admission: RE | Admit: 2016-06-13 | Discharge: 2016-06-13 | Disposition: A | Discharge status: Home / Self Care | Payer: 59 | Source: Ambulatory Visit | Attending: Internal Medicine | Admitting: Internal Medicine

## 2016-06-13 ENCOUNTER — Encounter (HOSPITAL_COMMUNITY)
Admission: RE | Disposition: A | Discharge status: Home / Self Care | Payer: Self-pay | Source: Ambulatory Visit | Attending: Internal Medicine

## 2016-06-13 ENCOUNTER — Encounter (HOSPITAL_COMMUNITY)
Admission: RE | Admit: 2016-06-13 | Discharge: 2016-06-13 | Disposition: A | Discharge status: Home / Self Care | Payer: 59 | Source: Ambulatory Visit | Attending: Pulmonary Disease | Admitting: Pulmonary Disease

## 2016-06-13 ENCOUNTER — Ambulatory Visit (HOSPITAL_COMMUNITY): Payer: 59 | Admitting: Anesthesiology

## 2016-06-13 ENCOUNTER — Encounter (HOSPITAL_COMMUNITY): Payer: Self-pay

## 2016-06-13 DIAGNOSIS — Z800 Family history of malignant neoplasm of digestive organs: Secondary | ICD-10-CM | POA: Diagnosis not present

## 2016-06-13 DIAGNOSIS — Z6841 Body Mass Index (BMI) 40.0 and over, adult: Secondary | ICD-10-CM | POA: Insufficient documentation

## 2016-06-13 DIAGNOSIS — Z87891 Personal history of nicotine dependence: Secondary | ICD-10-CM | POA: Insufficient documentation

## 2016-06-13 DIAGNOSIS — Z1211 Encounter for screening for malignant neoplasm of colon: Secondary | ICD-10-CM | POA: Diagnosis present

## 2016-06-13 DIAGNOSIS — Z8719 Personal history of other diseases of the digestive system: Secondary | ICD-10-CM | POA: Diagnosis not present

## 2016-06-13 DIAGNOSIS — I509 Heart failure, unspecified: Secondary | ICD-10-CM | POA: Insufficient documentation

## 2016-06-13 DIAGNOSIS — K635 Polyp of colon: Secondary | ICD-10-CM | POA: Diagnosis not present

## 2016-06-13 DIAGNOSIS — K648 Other hemorrhoids: Secondary | ICD-10-CM | POA: Diagnosis not present

## 2016-06-13 DIAGNOSIS — K219 Gastro-esophageal reflux disease without esophagitis: Secondary | ICD-10-CM | POA: Insufficient documentation

## 2016-06-13 DIAGNOSIS — D125 Benign neoplasm of sigmoid colon: Secondary | ICD-10-CM

## 2016-06-13 DIAGNOSIS — M1990 Unspecified osteoarthritis, unspecified site: Secondary | ICD-10-CM | POA: Diagnosis not present

## 2016-06-13 DIAGNOSIS — D120 Benign neoplasm of cecum: Secondary | ICD-10-CM

## 2016-06-13 DIAGNOSIS — G4730 Sleep apnea, unspecified: Secondary | ICD-10-CM | POA: Insufficient documentation

## 2016-06-13 DIAGNOSIS — F319 Bipolar disorder, unspecified: Secondary | ICD-10-CM | POA: Diagnosis not present

## 2016-06-13 DIAGNOSIS — J449 Chronic obstructive pulmonary disease, unspecified: Secondary | ICD-10-CM | POA: Diagnosis not present

## 2016-06-13 DIAGNOSIS — D122 Benign neoplasm of ascending colon: Secondary | ICD-10-CM

## 2016-06-13 DIAGNOSIS — Z1212 Encounter for screening for malignant neoplasm of rectum: Secondary | ICD-10-CM | POA: Diagnosis not present

## 2016-06-13 DIAGNOSIS — Z9981 Dependence on supplemental oxygen: Secondary | ICD-10-CM | POA: Insufficient documentation

## 2016-06-13 DIAGNOSIS — Z7982 Long term (current) use of aspirin: Secondary | ICD-10-CM | POA: Insufficient documentation

## 2016-06-13 DIAGNOSIS — I110 Hypertensive heart disease with heart failure: Secondary | ICD-10-CM | POA: Diagnosis not present

## 2016-06-13 DIAGNOSIS — Z8249 Family history of ischemic heart disease and other diseases of the circulatory system: Secondary | ICD-10-CM | POA: Diagnosis not present

## 2016-06-13 DIAGNOSIS — D124 Benign neoplasm of descending colon: Secondary | ICD-10-CM

## 2016-06-13 HISTORY — DX: Sleep apnea, unspecified: G47.30

## 2016-06-13 HISTORY — PX: COLONOSCOPY WITH PROPOFOL: SHX5780

## 2016-06-13 SURGERY — COLONOSCOPY WITH PROPOFOL
Anesthesia: Monitor Anesthesia Care

## 2016-06-13 MED ORDER — LIDOCAINE 2% (20 MG/ML) 5 ML SYRINGE
INTRAMUSCULAR | Status: DC | PRN
  Administered 2016-06-13: 40 mg via INTRAVENOUS

## 2016-06-13 MED ORDER — LIDOCAINE 2% (20 MG/ML) 5 ML SYRINGE
INTRAMUSCULAR | Status: AC
  Filled 2016-06-13: qty 5

## 2016-06-13 MED ORDER — SODIUM CHLORIDE 0.9 % IV SOLN: INTRAVENOUS | Status: DC

## 2016-06-13 MED ORDER — PROPOFOL 10 MG/ML IV BOLUS
INTRAVENOUS | Status: AC
  Filled 2016-06-13: qty 60

## 2016-06-13 MED ORDER — ONDANSETRON HCL 4 MG/2ML IJ SOLN
INTRAMUSCULAR | Status: AC
  Filled 2016-06-13: qty 2

## 2016-06-13 MED ORDER — PROPOFOL 10 MG/ML IV BOLUS
INTRAVENOUS | Status: DC | PRN
  Administered 2016-06-13 (×2): 20 mg via INTRAVENOUS

## 2016-06-13 MED ORDER — LACTATED RINGERS IV SOLN
INTRAVENOUS | Status: DC
  Administered 2016-06-13: 10:00:00 via INTRAVENOUS

## 2016-06-13 MED ORDER — ONDANSETRON HCL 4 MG/2ML IJ SOLN
INTRAMUSCULAR | Status: DC | PRN
  Administered 2016-06-13: 4 mg via INTRAVENOUS

## 2016-06-13 MED ORDER — PROPOFOL 500 MG/50ML IV EMUL
INTRAVENOUS | Status: DC | PRN
  Administered 2016-06-13: 100 ug/kg/min via INTRAVENOUS

## 2016-06-13 SURGICAL SUPPLY — 21 items

## 2016-06-13 NOTE — H&P (Signed)
HPI: Deborah [name] is a 60 year old female with history of COPD, sleep apnea, hypoxic respiratory failure due to volume overload from undiagnosed CHF, tobacco abuse in remission, hypertension and history of colonic diverticulitis who presents for outpatient colonoscopy in the hospital setting. She was seen in the office on 03/24/2016. At that time she was having issues with intermittent constipation but also was treated for diverticulitis diagnosed by CT scan in June 2017. Colonoscopy is recommended to evaluate her history of diverticulitis and also for screening. No change in symptoms since this evaluation. No further issues with abdominal pain. Moving bowels regularly with MiraLAX. Denies shortness of breath and chest pain today  Past Medical History:  Diagnosis Date  . Anemia    as teen  . Asthma   . CHF (congestive heart failure) ([place])   . COPD, mild ([place])   . Depression   . Diverticulitis   . Family history of anesthesia complication    vomiting  . GI bleeding   . Heart failure ([place])    New onset 07/25/14  . Histoplasmosis    left eye  . Hyperkalemia   . Hypertension   . Obesity (BMI 30-39.9)   . Pneumonia    dx wtih pneumonia on 05/27/16- seen by [facility]   . PONV (postoperative nausea and vomiting)   . Restrictive lung disease   . Shortness of breath    with exertion   . Sleep apnea    mask and oxygen at nite for sleep at 2L   . Tobacco abuse   . Umbilical hernia     Past Surgical History:  Procedure Laterality Date  . APPLICATION OF WOUND VAC  03/19/2013   Procedure: APPLICATION OF WOUND VAC;  Surgeon: Madilyn Hook, DO;  Location: [facility];  Service: General;;  . CESAREAN SECTION  04/12/85  . INSERTION OF MESH N/A 03/19/2013   Procedure: INSERTION OF MESH;  Surgeon: Madilyn Hook, DO;  Location: [facility];  Service: General;  Laterality: N/A;  . VENTRAL HERNIA REPAIR N/A 03/19/2013   Procedure:  OPEN VENTRAL HERNIA REPAIR WITH MESH AND APPLICATION OF WOUND VAC;  Surgeon: Madilyn Hook, DO;  Location: [facility];  Service: General;  Laterality: N/A;     (Not in an outpatient encounter)  No Known Allergies  Family History  Problem Relation Age of Onset  . Heart attack Mother   . Emphysema Mother     was a smoker  . Lymphoma Maternal Uncle   . Pancreatic cancer Maternal Aunt   . Emphysema      Mother  . Heart failure      Father  . Cancer      Unknown  . Colon cancer Neg Hx   . Breast cancer Neg Hx     Social History  Substance Use Topics  . Smoking status: Former Smoker    Packs/day: 3.00    Years: 43.00    Types: Cigarettes    Quit date: 12/17/2012  . Smokeless tobacco: Never Used  . Alcohol use No    ROS: As per history of present illness, otherwise negative  BP 129/81   Pulse 96   Temp 97.5 F (36.4 C) (Oral)   Resp (!) 21   Ht '5\' 3"'$  (1.6 m)   Wt 231 lb (104.8 kg)   SpO2 97%   BMI 40.92 kg/m  Gen: awake, alert, NAD HEENT: anicteric, op clear CV: RRR, no mrg Pulm: CTA b/l, no wheezes or rales today Abd: soft, obese, NT/ND, +  BS throughout Ext: no c/c, trace pretib edema Neuro: nonfocal   RELEVANT LABS AND IMAGING: CBC    Component Value Date/Time   WBC 10.1 01/20/2016 1020   RBC 4.36 01/20/2016 1020   HGB 13.1 01/20/2016 1020   HCT 39.6 01/20/2016 1020   PLT 270.0 01/20/2016 1020   MCV 90.8 01/20/2016 1020   MCH 30.6 01/09/2016 0552   MCHC 33.0 01/20/2016 1020   RDW 15.5 01/20/2016 1020   LYMPHSABS 2.3 01/20/2016 1020   MONOABS 0.8 01/20/2016 1020   EOSABS 0.2 01/20/2016 1020   BASOSABS 0.1 01/20/2016 1020    CMP     Component Value Date/Time   NA 140 01/08/2016 0453   K 3.7 01/08/2016 0453   CL 105 01/08/2016 0453   CO2 28 01/08/2016 0453   GLUCOSE 92 01/08/2016 0453   BUN 20 01/08/2016 0453   CREATININE 0.87 01/08/2016 0453   CALCIUM 8.4 (L) 01/08/2016 0453   PROT 6.7 01/07/2016 2008   ALBUMIN 3.3 (L) 01/07/2016 2008   AST 20 01/07/2016 2008   ALT 28 01/07/2016 2008   ALKPHOS 73 01/07/2016 2008    BILITOT 0.5 01/07/2016 2008   GFRNONAA >60 01/08/2016 0453   GFRAA >60 01/08/2016 0453    ASSESSMENT/PLAN: 60 year old female with history of COPD, sleep apnea, hypoxic respiratory failure due to volume overload from undiagnosed CHF, tobacco abuse in remission, hypertension and history of colonic diverticulitis who presents for outpatient colonoscopy in the hospital setting.   1. CRC screening/hx of diverticulitis -- colonoscopy recommended for screening and also in the setting of history of diverticulitis. No prior colonoscopy. The nature of the procedure, as well as the risks, benefits, and alternatives were carefully and thoroughly reviewed with the patient. Ample time for discussion and questions allowed. The patient understood, was satisfied, and agreed to proceed.       Cc:No referring provider defined for this encounter.

## 2016-06-13 NOTE — Transfer of Care (Signed)
Immediate Anesthesia Transfer of Care Note  Patient: Deborah [name]  Procedure(s) Performed: Procedure(s): COLONOSCOPY WITH PROPOFOL (N/A)  Patient Location: PACU  Anesthesia Type:MAC  Level of Consciousness:  sedated, patient cooperative and responds to stimulation  Airway & Oxygen Therapy:Patient Spontanous Breathing and Patient connected to face mask oxgen  Post-op Assessment:  Report given to PACU RN and Post -op Vital signs reviewed and stable  Post vital signs:  Reviewed and stable  Last Vitals:  Vitals:   06/13/16 0916 06/13/16 1114  BP: 129/81   Pulse: 96 74  Resp: (!) 21 13  Temp: 41.4 C     Complications: No apparent anesthesia complications

## 2016-06-13 NOTE — Op Note (Signed)
[facility] Patient Name: Deborah [name] Procedure Date: 06/13/2016 MRN: [id number] Attending MD: Jerene Bears , MD Date of Birth: May 20, 1956 CSN: [id number] Age: 60 Admit Type: Outpatient Procedure:                Colonoscopy Indications:              Screening for colorectal malignant neoplasm, This                            is the patient's first colonoscopy (June 2017 with                            CT scan) Providers:                Lajuan Lines. Hilarie Fredrickson, MD, Zenon Mayo, RN, William Dalton, Technician Referring MD:             Marciano Sequin. [name] Medicines:                Monitored Anesthesia Care Complications:            No immediate complications. Estimated Blood Loss:     Estimated blood loss was minimal. Procedure:                Pre-Anesthesia Assessment:                           - Prior to the procedure, a History and Physical                            was performed, and patient medications and                            allergies were reviewed. The patient's tolerance of                            previous anesthesia was also reviewed. The risks                            and benefits of the procedure and the sedation                            options and risks were discussed with the patient.                            All questions were answered, and informed consent                            was obtained. Prior Anticoagulants: The patient has                            taken no previous anticoagulant or antiplatelet  agents. ASA Grade Assessment: III - A patient with                            severe systemic disease. After reviewing the risks                            and benefits, the patient was deemed in                            satisfactory condition to undergo the procedure.                           After obtaining informed consent, the colonoscope                            was passed under  direct vision. Throughout the                            procedure, the patient's blood pressure, pulse, and                            oxygen saturations were monitored continuously. The                            EC-[id number] ([id number]) scope was introduced through                            the anus and advanced to the the cecum, identified                            by appendiceal orifice and ileocecal valve. The                            colonoscopy was performed without difficulty. The                            patient tolerated the procedure well. The quality                            of the bowel preparation was fair. The ileocecal                            valve, appendiceal orifice, and rectum were                            photographed. Scope In: 10:38:42 AM Scope Out: 27:03:50 AM Scope Withdrawal Time: 0 hours 21 minutes 15 seconds  Total Procedure Duration: 0 hours 27 minutes 30 seconds  Findings:      The digital rectal exam was normal.      A 3 mm polyp was found in the cecum. The polyp was sessile. The polyp       was removed with a cold snare. Resection and retrieval were complete.      A 5 mm polyp was found in the  ascending colon. The polyp was sessile.       The polyp was removed with a cold snare. Resection and retrieval were       complete.      A 6 mm polyp was found in the descending colon. The polyp was sessile       and located at rim of diverticulum. The polyp was removed with a cold       snare. Resection and retrieval were complete.      Two semi-pedunculated polyps were found in the sigmoid colon. The polyps       were 6 to 7 mm in size. These polyps were removed with a hot snare.       Resection and retrieval were complete.      Many small and large-mouthed diverticula were found from ascending colon       to sigmoid colon.      Internal hemorrhoids were found during retroflexion. The hemorrhoids       were small. Impression:               - Preparation  of the colon was fair, likely in part                            due to extensive diverticulosis.                           - One 3 mm polyp in the cecum, removed with a cold                            snare. Resected and retrieved.                           - One 5 mm polyp in the ascending colon, removed                            with a cold snare. Resected and retrieved.                           - One 6 mm polyp in the descending colon, removed                            with a cold snare. Resected and retrieved.                           - Two 6 to 7 mm polyps in the sigmoid colon,                            removed with a hot snare. Resected and retrieved.                           - Severe diverticulosis from ascending colon to                            sigmoid colon.                           -  Internal hemorrhoids. Moderate Sedation:      N/A Recommendation:           - Patient has a contact number available for                            emergencies. The signs and symptoms of potential                            delayed complications were discussed with the                            patient. Return to normal activities tomorrow.                            Written discharge instructions were provided to the                            patient.                           - Resume previous diet.                           - Continue present medications.                           - No additional aspirin, and no ibuprofen,                            naproxen, or other non-steroidal anti-inflammatory                            drugs, including no BC or Goodys Powders for 2                            weeks after polyp removal.                           - Await pathology results.                           - Repeat colonoscopy within 1 year because the                            bowel preparation was suboptimal; 2 day prep. Procedure Code(s):        --- Professional ---                            [phone], Colonoscopy, flexible; with removal of                            tumor(s), polyp(s), or other lesion(s) by snare                            technique Diagnosis Code(s):        --- Professional ---  Z12.11, Encounter for screening for malignant                            neoplasm of colon                           K64.8, Other hemorrhoids                           D12.0, Benign neoplasm of cecum                           D12.2, Benign neoplasm of ascending colon                           D12.4, Benign neoplasm of descending colon                           D12.5, Benign neoplasm of sigmoid colon                           K57.30, Diverticulosis of large intestine without                            perforation or abscess without bleeding CPT copyright 2016 [employer]. All rights reserved. The codes documented in this report are preliminary and upon coder review may  be revised to meet current compliance requirements. Jerene Bears, MD 06/13/2016 11:16:08 AM This report has been signed electronically. Number of Addenda: 0

## 2016-06-13 NOTE — Discharge Instructions (Signed)

## 2016-06-13 NOTE — Anesthesia Postprocedure Evaluation (Signed)
Anesthesia Post Note  Patient: [name]  Procedure(s) Performed: Procedure(s) (LRB): COLONOSCOPY WITH PROPOFOL (N/A)  Patient location during evaluation: Endoscopy Anesthesia Type: MAC Level of consciousness: awake and alert, oriented and patient cooperative Pain management: pain level controlled Vital Signs Assessment: post-procedure vital signs reviewed and stable Respiratory status: nonlabored ventilation, respiratory function stable, spontaneous breathing and patient connected to nasal cannula oxygen Cardiovascular status: blood pressure returned to baseline and stable Postop Assessment: no signs of nausea or vomiting Anesthetic complications: no    Last Vitals:  Vitals:   06/13/16 1114 06/13/16 1116  BP:  (!) 104/55  Pulse: 74 76  Resp: 13 (!) 21  Temp:  36.4 C    Last Pain:  Vitals:   06/13/16 1116  TempSrc: Oral                 Yemaya Barnier,E. Christphor Groft

## 2016-06-13 NOTE — Anesthesia Preprocedure Evaluation (Addendum)
Anesthesia Evaluation    History of Anesthesia Complications (+) PONV and history of anesthetic complications  Airway Mallampati: II  TM Distance: >3 FB Neck ROM: Full    Dental  (+) Poor Dentition, Loose, Missing, Dental Advisory Given, Chipped   Pulmonary shortness of breath and Long-Term Oxygen Therapy, sleep apnea, Continuous Positive Airway Pressure Ventilation and Oxygen sleep apnea , COPD,  COPD inhaler and oxygen dependent, former smoker,    breath sounds clear to auscultation       Cardiovascular hypertension, Pt. on medications (-) angina Rhythm:Regular Rate:Normal  '16 ECHO: EF 71-06%, grade 1 diastolic dysfunction, dilated right ventricle with hypokinesis and mild pulmonary hypertension   Neuro/Psych Depression Bipolar Disorder negative neurological ROS     GI/Hepatic Neg liver ROS, GERD  Controlled,  Endo/Other  Morbid obesity  Renal/GU negative Renal ROS     Musculoskeletal  (+) Arthritis ,   Abdominal (+) + obese,   Peds  Hematology negative hematology ROS (+)   Anesthesia Other Findings   Reproductive/Obstetrics                            Anesthesia Physical Anesthesia Plan  ASA: III  Anesthesia Plan: MAC   Post-op Pain Management:    Induction:   Airway Management Planned: Natural Airway and Simple Face Mask  Additional Equipment:   Intra-op Plan:   Post-operative Plan:   Informed Consent: I have reviewed the patients History and Physical, chart, labs and discussed the procedure including the risks, benefits and alternatives for the proposed anesthesia with the patient or authorized representative who has indicated his/her understanding and acceptance.   Dental advisory given  Plan Discussed with: CRNA and Surgeon  Anesthesia Plan Comments: (Plan routine monitors, MAC)        Anesthesia Quick Evaluation

## 2016-06-14 ENCOUNTER — Encounter (HOSPITAL_COMMUNITY): Payer: Self-pay | Admitting: Internal Medicine

## 2016-06-14 ENCOUNTER — Ambulatory Visit: Payer: Self-pay | Admitting: Acute Care

## 2016-06-15 ENCOUNTER — Encounter: Payer: Self-pay | Admitting: Internal Medicine

## 2016-06-15 ENCOUNTER — Encounter (HOSPITAL_COMMUNITY)
Admission: RE | Admit: 2016-06-15 | Discharge: 2016-06-15 | Disposition: A | Discharge status: Home / Self Care | Payer: 59 | Source: Ambulatory Visit | Attending: Pulmonary Disease | Admitting: Pulmonary Disease

## 2016-06-15 VITALS — Wt 233.5 lb

## 2016-06-15 DIAGNOSIS — J984 Other disorders of lung: Secondary | ICD-10-CM

## 2016-06-15 NOTE — Progress Notes (Signed)
Daily Session Note  Patient Details  Name: Deborah [name] MRN: [id number] Date of Birth: [birth year] Referring Provider:   April Manson Pulmonary Rehab Walk Test from 04/20/2016 in [place]  Referring Provider  Dr. Elsworth Soho      Encounter Date: 06/15/2016  Check In:     Session Check In - 06/15/16 1330      Check-In   Location [facility]   Staff Present Rosebud Poles, RN, BSN;Molly diVincenzo, MS, ACSM RCEP, Exercise Physiologist;Portia Rollene Rotunda, RN, BSN;Justa Hatchell Ysidro Evert, Felipe Drone, RN, [facility]   Supervising physician immediately available to respond to emergencies [facility] immediately available   Physician(s) Dr. Ree Kida   Medication changes reported     No   Fall or balance concerns reported    No   Warm-up and Cool-down Performed as group-led instruction   Resistance Training Performed Yes   VAD Patient? No     Pain Assessment   Currently in Pain? No/denies   Multiple Pain Sites No      Capillary Blood Glucose: No results found for this or any previous visit (from the past 24 hour(s)).      Exercise Prescription Changes - 06/15/16 1600      Response to Exercise   Blood Pressure (Admit) 114/84   Blood Pressure (Exercise) 100/62   Blood Pressure (Exit) 100/60   Heart Rate (Admit) 100 bpm   Heart Rate (Exercise) 121 bpm   Heart Rate (Exit) 99 bpm   Oxygen Saturation (Admit) 92 %   Oxygen Saturation (Exercise) 95 %   Oxygen Saturation (Exit) 91 %   Rating of Perceived Exertion (Exercise) 12   Perceived Dyspnea (Exercise) 1   Duration Progress to 45 minutes of aerobic exercise without signs/symptoms of physical distress   Intensity THRR unchanged     Progression   Progression Continue to progress workloads to maintain intensity without signs/symptoms of physical distress.     Resistance Training   Training Prescription Yes   Weight green bands   Reps 10-12  10 minutes of strength training     Interval Training   Interval Training No     Oxygen   Oxygen Continuous   Liters 4     Bike   Level 0.5   Minutes 17     NuStep   Level 5   Minutes 17   METs 2.6     Goals Met:  Exercise tolerated well No report of cardiac concerns or symptoms Strength training completed today  Goals Unmet:  Not Applicable  Comments: Service time is from 1330 to 1520    Dr. Rush Farmer is Medical Director for Pulmonary Rehab at [facility].

## 2016-06-20 ENCOUNTER — Encounter (HOSPITAL_COMMUNITY)
Admission: RE | Admit: 2016-06-20 | Discharge: 2016-06-20 | Disposition: A | Discharge status: Home / Self Care | Payer: 59 | Source: Ambulatory Visit | Attending: Pulmonary Disease | Admitting: Pulmonary Disease

## 2016-06-20 VITALS — Wt 229.7 lb

## 2016-06-20 DIAGNOSIS — Z79899 Other long term (current) drug therapy: Secondary | ICD-10-CM | POA: Insufficient documentation

## 2016-06-20 DIAGNOSIS — Z87891 Personal history of nicotine dependence: Secondary | ICD-10-CM | POA: Insufficient documentation

## 2016-06-20 DIAGNOSIS — I110 Hypertensive heart disease with heart failure: Secondary | ICD-10-CM | POA: Diagnosis not present

## 2016-06-20 DIAGNOSIS — Z7982 Long term (current) use of aspirin: Secondary | ICD-10-CM | POA: Diagnosis not present

## 2016-06-20 DIAGNOSIS — E669 Obesity, unspecified: Secondary | ICD-10-CM | POA: Insufficient documentation

## 2016-06-20 DIAGNOSIS — Z6841 Body Mass Index (BMI) 40.0 and over, adult: Secondary | ICD-10-CM | POA: Diagnosis not present

## 2016-06-20 DIAGNOSIS — J449 Chronic obstructive pulmonary disease, unspecified: Secondary | ICD-10-CM | POA: Diagnosis not present

## 2016-06-20 DIAGNOSIS — J984 Other disorders of lung: Secondary | ICD-10-CM

## 2016-06-20 DIAGNOSIS — I509 Heart failure, unspecified: Secondary | ICD-10-CM | POA: Diagnosis not present

## 2016-06-20 DIAGNOSIS — Z7951 Long term (current) use of inhaled steroids: Secondary | ICD-10-CM | POA: Diagnosis not present

## 2016-06-20 NOTE — Progress Notes (Signed)
I have reviewed a Home Exercise Prescription with Deborah [name] . Deborah [name] is currently exercising at home.  The patient was advised to walk 2-3 days a week for 30-45 minutes.  Deborah [name] and I discussed how to progress their exercise prescription.  The patient stated that their goals were to be less short of breath and be able to get out more.  The patient stated that they understand the exercise prescription.  We reviewed exercise guidelines, target heart rate during exercise, oxygen use, weather, home pulse oximeter, endpoints for exercise, and goals.  Patient is encouraged to come to me with any questions. I will continue to follow up with the patient to assist them with progression and safety.

## 2016-06-20 NOTE — Progress Notes (Signed)
Daily Session Note  Patient Details  Name: Deborah [name] MRN: [id number] Date of Birth: Mar 19, 1956 Referring Provider:   April Manson Pulmonary Rehab Walk Test from 04/20/2016 in [place]  Referring Provider  Dr. Elsworth Soho      Encounter Date: 06/20/2016  Check In:     Session Check In - 06/20/16 1358      Check-In   Location [facility]   Staff Present Trish Fountain, RN, Maxcine Ham, RN, BSN;Lisa Ysidro Evert, RN;Olinty Liberty, MS, ACSM CEP, Exercise Physiologist   Supervising physician immediately available to respond to emergencies [facility] immediately available   Physician(s) Dr. Ree Kida   Medication changes reported     No   Warm-up and Cool-down Performed as group-led instruction   Resistance Training Performed Yes   VAD Patient? No     Pain Assessment   Currently in Pain? No/denies   Multiple Pain Sites No      Capillary Blood Glucose: No results found for this or any previous visit (from the past 24 hour(s)).      Exercise Prescription Changes - 06/20/16 1554      Response to Exercise   Blood Pressure (Admit) 112/68   Blood Pressure (Exercise) 110/84   Blood Pressure (Exit) 102/68   Heart Rate (Admit) 117 bpm   Heart Rate (Exercise) 146 bpm   Heart Rate (Exit) 112 bpm   Oxygen Saturation (Admit) 90 %   Oxygen Saturation (Exercise) 95 %   Oxygen Saturation (Exit) 96 %   Rating of Perceived Exertion (Exercise) 13   Perceived Dyspnea (Exercise) 3   Duration Progress to 45 minutes of aerobic exercise without signs/symptoms of physical distress   Intensity THRR unchanged     Progression   Progression Continue to progress workloads to maintain intensity without signs/symptoms of physical distress.     Resistance Training   Training Prescription Yes   Weight green bands   Reps 10-12  10 minutes of strength training     Interval Training   Interval Training No     Oxygen   Oxygen Continuous    Liters 4     Bike   Level 0.5   Minutes 17     NuStep   Level 5   Minutes 17   METs 2.3     Track   Laps 7   Minutes 17     Goals Met:  Improved SOB with ADL's Using PLB without cueing & demonstrates good technique Exercise tolerated well No report of cardiac concerns or symptoms Strength training completed today  Goals Unmet:  Not Applicable  Comments: Service time is from 1330 to 1515   Dr. Rush Farmer is Medical Director for Pulmonary Rehab at [facility].

## 2016-06-20 NOTE — Progress Notes (Signed)
Pulmonary Individual Treatment Plan  Patient Details  Name: Deborah [name] MRN: [id number] Date of Birth: 10/03/1955 Referring Provider:   April Manson Pulmonary Rehab Walk Test from 04/20/2016 in [place]  Referring Provider  Dr. Elsworth Soho      Initial Encounter Date:  Flowsheet Row Pulmonary Rehab Walk Test from 04/20/2016 in [place]  Date  04/20/16  Referring Provider  Dr. Elsworth Soho      Visit Diagnosis: Restrictive lung disease  Patient's Home Medications on Admission:   Current Outpatient Prescriptions:  .  albuterol (PROVENTIL HFA;VENTOLIN HFA) 108 (90 Base) MCG/ACT inhaler, Inhale 2 puffs into the lungs every 6 (six) hours as needed for wheezing or shortness of breath., Disp: 1 Inhaler, Rfl: 2 .  albuterol (PROVENTIL) (2.5 MG/3ML) 0.083% nebulizer solution, USE 1 VIAL VIA NEBULIZER EVERY 3 HOURS AS NEEDED FOR WHEEZING OR SHORTNESS OF BREATH, Disp: 1125 mL, Rfl: 1 .  aspirin 81 MG chewable tablet, Chew 1 tablet (81 mg total) by mouth daily., Disp: 30 tablet, Rfl: 1 .  CARTIA XT 120 MG 24 hr capsule, TAKE ONE CAPSULE BY MOUTH DAILY, Disp: 30 capsule, Rfl: 3 .  fexofenadine (ALLEGRA) 180 MG tablet, Take 180 mg by mouth every other day., Disp: , Rfl:  .  fluticasone furoate-vilanterol (BREO ELLIPTA) 100-25 MCG/INH AEPB, Inhale 1 puff into the lungs daily., Disp: 1 each, Rfl: 5 .  furosemide (LASIX) 20 MG tablet, TAKE 2 TABLETS BY MOUTH EVERY MORNING AND 1 TABLET BY MOUTH EVERY EVENING, Disp: 45 tablet, Rfl: 5 .  loteprednol (LOTEMAX) 0.5 % ophthalmic suspension, Place 1 drop into the left eye 3 (three) times daily., Disp: , Rfl:  .  montelukast (SINGULAIR) 10 MG tablet, TAKE 1 TABLET BY MOUTH EVERY NIGHT AT BEDTIME, Disp: 90 tablet, Rfl: 1 .  potassium chloride SA (K-DUR,KLOR-CON) 20 MEQ tablet, TAKE 1/2 TABLET(10 MEQ) BY MOUTH DAILY (Patient taking differently: Take 10 mEq by mouth daily. ), Disp: 45 tablet, Rfl: 3 .   vitamin B-12 (CYANOCOBALAMIN) 1000 MCG tablet, Take 1,000 mcg by mouth daily., Disp: , Rfl:   Past Medical History: Past Medical History:  Diagnosis Date  . Anemia    as teen  . Asthma   . CHF (congestive heart failure) ([place])   . COPD, mild ([place])   . Depression   . Diverticulitis   . Family history of anesthesia complication    vomiting  . GI bleeding   . Heart failure ([place])    New onset 07/25/14  . Histoplasmosis    left eye  . Hyperkalemia   . Hypertension   . Obesity (BMI 30-39.9)   . Pneumonia    dx wtih pneumonia on 05/27/16- seen by [facility]   . PONV (postoperative nausea and vomiting)   . Restrictive lung disease   . Shortness of breath    with exertion   . Sleep apnea    mask and oxygen at nite for sleep at 2L   . Tobacco abuse   . Umbilical hernia     Tobacco Use: History  Smoking Status  . Former Smoker  . Packs/day: 3.00  . Years: 43.00  . Types: Cigarettes  . Quit date: 12/17/2012  Smokeless Tobacco  . Never Used    Labs: Recent Review Flowsheet Data    Labs for ITP Cardiac and Pulmonary Rehab Latest Ref Rng & Units 03/29/2015 12/18/2015 12/18/2015 12/18/2015 12/18/2015   Cholestrol 0 - 200 mg/dL 200 - - - -  LDLCALC 0 - 99 mg/dL 136(H) - - - -   HDL >39.00 mg/dL 44.60 - - - -   Trlycerides 0.0 - 149.0 mg/dL 98.0 - - - -   Hemoglobin A1c <5.7 % - - - - -   PHART 7.350 - 7.450 - - 7.289(L) 7.253(L) 7.279(L)   PCO2ART 35.0 - 45.0 mmHg - - 74.8(HH) 85.4(HH) 77.8(HH)   HCO3 20.0 - 24.0 mEq/L - 35.6(H) 34.7(H) 36.4(H) 35.3(H)   TCO2 0 - 100 mmol/L - 31.6 31.2 33.2 32.0   O2SAT % - 87.5 90.5 92.7 93.0      Capillary Blood Glucose: Lab Results  Component Value Date   GLUCAP 117 (H) 03/22/2013   GLUCAP 108 (H) 03/22/2013   GLUCAP 112 (H) 03/21/2013   GLUCAP 117 (H) 03/21/2013   GLUCAP 108 (H) 03/21/2013     ADL UCSD:   Pulmonary Function Assessment:     Pulmonary Function Assessment - 04/17/16 1316      Breath   Bilateral Breath Sounds Clear    Shortness of Breath Yes      Exercise Target Goals:    Exercise Program Goal: Individual exercise prescription set with THRR, safety & activity barriers. Participant demonstrates ability to understand and report RPE using BORG scale, to self-measure pulse accurately, and to acknowledge the importance of the exercise prescription.  Exercise Prescription Goal: Starting with aerobic activity 30 plus minutes a day, 3 days per week for initial exercise prescription. Provide home exercise prescription and guidelines that participant acknowledges understanding prior to discharge.  Activity Barriers & Risk Stratification:     Activity Barriers & Cardiac Risk Stratification - 04/17/16 1313      Activity Barriers & Cardiac Risk Stratification   Activity Barriers Arthritis;Back Problems      6 Minute Walk:     6 Minute Walk    Row Name 04/20/16 1638         6 Minute Walk   Phase Initial     Distance 1355 feet     Walk Time 6 minutes     # of Rest Breaks 0     MPH 2.56     METS 2.91     RPE 15     Perceived Dyspnea  3     Symptoms No     Resting HR 107 bpm     Resting BP 94/64     Max Ex. HR 154 bpm     Max Ex. BP 124/76       Interval HR   Baseline HR 107     1 Minute HR 117     2 Minute HR 125     3 Minute HR 131     4 Minute HR 132     5 Minute HR 135     6 Minute HR 154     2 Minute Post HR 146     Interval Heart Rate? Yes       Interval Oxygen   Interval Oxygen? Yes     Baseline Oxygen Saturation % 97 %     Baseline Liters of Oxygen 4 L     1 Minute Oxygen Saturation % 96 %     1 Minute Liters of Oxygen 4 L     2 Minute Oxygen Saturation % 93 %     2 Minute Liters of Oxygen 4 L     3 Minute Oxygen Saturation % 92 %     3 Minute Liters of  Oxygen 4 L     4 Minute Oxygen Saturation % 93 %     4 Minute Liters of Oxygen 4 L     5 Minute Oxygen Saturation % 93 %     5 Minute Liters of Oxygen 4 L     6 Minute Oxygen Saturation % 92 %     6 Minute Liters of  Oxygen 4 L     2 Minute Post Oxygen Saturation % 96 %     2 Minute Post Liters of Oxygen 4 L        Initial Exercise Prescription:     Initial Exercise Prescription - 04/20/16 1600      Date of Initial Exercise RX and Referring Provider   Date 04/20/16   Referring Provider Dr. Elsworth Soho     Oxygen   Oxygen Continuous   Liters 4     Bike   Level 0.5   Minutes 17     NuStep   Level 2   Minutes 17   METs 1.6     Track   Laps 5   Minutes 17     Prescription Details   Frequency (times per week) 2   Duration Progress to 45 minutes of aerobic exercise without signs/symptoms of physical distress     Intensity   THRR 40-80% of Max Heartrate 65-131   Ratings of Perceived Exertion 11-13   Perceived Dyspnea 0-4     Progression   Progression Continue progressive overload as per policy without signs/symptoms or physical distress.     Resistance Training   Training Prescription Yes   Weight green bands   Reps 10-12      Perform Capillary Blood Glucose checks as needed.  Exercise Prescription Changes:     Exercise Prescription Changes    Row Name 05/02/16 1500 05/04/16 1600 05/09/16 1500 05/11/16 1600 05/16/16 1600     Exercise Review   Progression  - Yes Yes  -  -     Response to Exercise   Blood Pressure (Admit) 102/70 104/66 102/60 120/70 114/64   Blood Pressure (Exercise) 130/70 132/70 100/70 124/80 130/80   Blood Pressure (Exit) 98/78 110/62 100/60 100/66 106/60   Heart Rate (Admit) 89 bpm 89 bpm 86 bpm 116 bpm  pt forgot to take her medication today, will take. 112 bpm   Heart Rate (Exercise) 141 bpm 137 bpm 114 bpm 140 bpm 121 bpm   Heart Rate (Exit) 108 bpm 107 bpm 93 bpm 112 bpm 113 bpm   Oxygen Saturation (Admit) 96 % 92 % 96 % 95 % 96 %   Oxygen Saturation (Exercise) 94 % 95 % 94 % 92 % 95 %   Oxygen Saturation (Exit) 95 % 97 % 93 % 96 % 94 %   Rating of Perceived Exertion (Exercise) 13 13 15 12 13    Perceived Dyspnea (Exercise) 3 3 3 1 1    Duration  Progress to 45 minutes of aerobic exercise without signs/symptoms of physical distress Progress to 45 minutes of aerobic exercise without signs/symptoms of physical distress Progress to 45 minutes of aerobic exercise without signs/symptoms of physical distress Progress to 45 minutes of aerobic exercise without signs/symptoms of physical distress Progress to 45 minutes of aerobic exercise without signs/symptoms of physical distress   Intensity -  40-80% of HRR THRR unchanged THRR unchanged THRR unchanged THRR unchanged     Progression   Progression Continue to progress workloads to maintain intensity without signs/symptoms of physical distress. Continue  to progress workloads to maintain intensity without signs/symptoms of physical distress. Continue to progress workloads to maintain intensity without signs/symptoms of physical distress. Continue to progress workloads to maintain intensity without signs/symptoms of physical distress. Continue to progress workloads to maintain intensity without signs/symptoms of physical distress.     Resistance Training   Training Prescription Yes Yes Yes Yes Yes   Weight green bands green bands green bands green bands green bands   Reps 10-12  10 minutes of strength training 10-12  10 minutes of strength training 10-12  10 minutes of strength training 10-12  10 minutes of strength training 10-12  10 minutes of strength training     Interval Training   Interval Training No No No No No     Oxygen   Oxygen Continuous Continuous Continuous Continuous Continuous   Liters 4 4 4 4 4      Bike   Level 0.5  - 0.5 0.5 0.5   Minutes 17  - 17 17 17      NuStep   Level 2 3 4   - 4   Minutes 17 17 17   - 17   METs 1.5 1.6 1.7  - 2     Track   Laps 12 11 9 9   -   Minutes 17 17 17 17   -   Row Name 05/18/16 1624 05/23/16 1600 06/15/16 1600         Exercise Review   Progression  - Yes  -       Response to Exercise   Blood Pressure (Admit) 102/70 122/76 114/84      Blood Pressure (Exercise) 118/77 106/70 100/62     Blood Pressure (Exit) 102/72 100/64 100/60     Heart Rate (Admit) 82 bpm 111 bpm 100 bpm     Heart Rate (Exercise) 117 bpm 151 bpm 121 bpm     Heart Rate (Exit) 97 bpm 78 bpm 99 bpm     Oxygen Saturation (Admit) 94 % 95 % 92 %     Oxygen Saturation (Exercise) 95 % 94 % 95 %     Oxygen Saturation (Exit) 93 % 97 % 91 %     Rating of Perceived Exertion (Exercise) 12 15 12      Perceived Dyspnea (Exercise) 0 2 1     Duration Progress to 45 minutes of aerobic exercise without signs/symptoms of physical distress Progress to 45 minutes of aerobic exercise without signs/symptoms of physical distress Progress to 45 minutes of aerobic exercise without signs/symptoms of physical distress     Intensity THRR unchanged THRR unchanged THRR unchanged       Progression   Progression Continue to progress workloads to maintain intensity without signs/symptoms of physical distress. Continue to progress workloads to maintain intensity without signs/symptoms of physical distress. Continue to progress workloads to maintain intensity without signs/symptoms of physical distress.       Resistance Training   Training Prescription Yes Yes Yes     Weight green bands green bands green bands     Reps 10-12  10 minutes of strength training 10-12  10 minutes of strength training 10-12  10 minutes of strength training       Interval Training   Interval Training No No No       Oxygen   Oxygen Continuous Continuous Continuous     Liters 4 4 4        Bike   Level 0.5 0.5 0.5     Minutes 76 16 07  NuStep   Level 4 5 5      Minutes 17 17 17      METs 2.1 1.5 2.6       Track   Laps  - 11  -     Minutes  - 17  -        Exercise Comments:     Exercise Comments    Row Name 05/22/16 1030 06/19/16 1636         Exercise Comments Patient is very open to increasing workloads. Will cont. to monitor and progress.  Patient has had a string of absences due to  illness, Just returned on 11/30. Will cont. to monitor and progress.         Discharge Exercise Prescription (Final Exercise Prescription Changes):     Exercise Prescription Changes - 06/15/16 1600      Response to Exercise   Blood Pressure (Admit) 114/84   Blood Pressure (Exercise) 100/62   Blood Pressure (Exit) 100/60   Heart Rate (Admit) 100 bpm   Heart Rate (Exercise) 121 bpm   Heart Rate (Exit) 99 bpm   Oxygen Saturation (Admit) 92 %   Oxygen Saturation (Exercise) 95 %   Oxygen Saturation (Exit) 91 %   Rating of Perceived Exertion (Exercise) 12   Perceived Dyspnea (Exercise) 1   Duration Progress to 45 minutes of aerobic exercise without signs/symptoms of physical distress   Intensity THRR unchanged     Progression   Progression Continue to progress workloads to maintain intensity without signs/symptoms of physical distress.     Resistance Training   Training Prescription Yes   Weight green bands   Reps 10-12  10 minutes of strength training     Interval Training   Interval Training No     Oxygen   Oxygen Continuous   Liters 4     Bike   Level 0.5   Minutes 17     NuStep   Level 5   Minutes 17   METs 2.6       Nutrition:  Target Goals: Understanding of nutrition guidelines, daily intake of sodium <1522m, cholesterol <2026m calories 30% from fat and 7% or less from saturated fats, daily to have 5 or more servings of fruits and vegetables.  Biometrics:     Pre Biometrics - 04/17/16 1314      Pre Biometrics   Grip Strength 29 kg       Nutrition Therapy Plan and Nutrition Goals:     Nutrition Therapy & Goals - 05/09/16 1039      Nutrition Therapy   Diet Low Sodium     Personal Nutrition Goals   Personal Goal #1 1-2 lb wt loss/week to a wt loss goal of 6-24 lb at graduation from [facility] and counsel regarding individualized specific dietary modifications aiming towards  targeted core components such as weight, hypertension, lipid management, diabetes, heart failure and other comorbidities.   Expected Outcomes Short Term Goal: Understand basic principles of dietary content, such as calories, fat, sodium, cholesterol and nutrients.;Long Term Goal: Adherence to prescribed nutrition plan.      Nutrition Discharge: Rate Your Plate Scores:     Nutrition Assessments - 05/16/16 1457      Rate Your Plate Scores   Pre Score 44      Psychosocial: Target Goals: Acknowledge presence or absence of depression, maximize coping skills, provide positive support system. Participant is able to verbalize types  and ability to use techniques and skills needed for reducing stress and depression.  Initial Review & Psychosocial Screening:     Initial Psych Review & Screening - 04/17/16 1321      Initial Review   Current issues with Current Depression;History of Depression     Family Dynamics   Good Support System? Yes     Barriers   Psychosocial barriers to participate in program Psychosocial barriers identified (see note)  will contact Dr. [name] PCP for depression issues     Screening Interventions   Interventions Encouraged to exercise      Quality of Life Scores:   PHQ-9: Recent Review Flowsheet Data    Depression screen [facility] 2/9 04/17/2016   Decreased Interest 3   Down, Depressed, Hopeless 3   PHQ - 2 Score 6   Altered sleeping 3   Tired, decreased energy 3   Change in appetite 2   Feeling bad or failure about yourself  3   Trouble concentrating 0   Moving slowly or fidgety/restless 0   Suicidal thoughts 1   PHQ-9 Score 18   Difficult doing work/chores Very difficult      Psychosocial Evaluation and Intervention:     Psychosocial Evaluation - 04/17/16 1323      Psychosocial Evaluation & Interventions   Interventions Physician referral   Comments Will contact Dr. [name] for depression issues   Continued Psychosocial Services Needed Yes       Psychosocial Re-Evaluation:     Psychosocial Re-Evaluation    [place] Name 05/15/16 1254 06/13/16 1006           Psychosocial Re-Evaluation   Interventions Therapist referral;Encouraged to attend Pulmonary Rehabilitation for the exercise Therapist referral;Encouraged to attend Pulmonary Rehabilitation for the exercise      Comments Had a reaction in the past 2 2 different antidepressants, is currently seeing a counselor for suicidal thoughts. No antidepressant meds for now due to adverse reactions, is seeing a Social worker.      Continued Psychosocial Services Needed Yes Yes        Education: Education Goals: Education classes will be provided on a weekly basis, covering required topics. Participant will state understanding/return demonstration of topics presented.  Learning Barriers/Preferences:     Learning Barriers/Preferences - 04/17/16 1313      Learning Barriers/Preferences   Learning Barriers None   Learning Preferences Pictoral;Verbal Instruction      Education Topics: Risk Factor Reduction:  -Group instruction that is supported by a PowerPoint presentation. Instructor discusses the definition of a risk factor, different risk factors for pulmonary disease, and how the heart and lungs work together.     Nutrition for Pulmonary Patient:  -Group instruction provided by PowerPoint slides, verbal discussion, and written materials to support subject matter. The instructor gives an explanation and review of healthy diet recommendations, which includes a discussion on weight management, recommendations for fruit and vegetable consumption, as well as protein, fluid, caffeine, fiber, sodium, sugar, and alcohol. Tips for eating when patients are short of breath are discussed.   Pursed Lip Breathing:  -Group instruction that is supported by demonstration and informational handouts. Instructor discusses the benefits of pursed lip and diaphragmatic breathing and detailed demonstration  on how to preform both.     Oxygen Safety:  -Group instruction provided by PowerPoint, verbal discussion, and written material to support subject matter. There is an overview of "What is Oxygen" and "Why do we need it".  Instructor also reviews how to create a safe  environment for oxygen use, the importance of using oxygen as prescribed, and the risks of noncompliance. There is a brief discussion on traveling with oxygen and resources the patient may utilize. Flowsheet Row PULMONARY REHAB OTHER RESPIRATORY from 06/15/2016 in [place]  Date  05/18/16  Educator  rn  Instruction Review Code  2- meets goals/outcomes      Oxygen Equipment:  -Group instruction provided by [employer] Staff utilizing handouts, written materials, and equipment demonstrations.   Signs and Symptoms:  -Group instruction provided by written material and verbal discussion to support subject matter. Warning signs and symptoms of infection, stroke, and heart attack are reviewed and when to call the physician/911 reinforced. Tips for preventing the spread of infection discussed.   Advanced Directives:  -Group instruction provided by verbal instruction and written material to support subject matter. Instructor reviews Advanced Directive laws and proper instruction for filling out document.   Pulmonary Video:  -Group video education that reviews the importance of medication and oxygen compliance, exercise, good nutrition, pulmonary hygiene, and pursed lip and diaphragmatic breathing for the pulmonary patient. Flowsheet Row PULMONARY REHAB OTHER RESPIRATORY from 06/15/2016 in [place]  Date  05/04/16  Educator  video  Instruction Review Code  2- meets goals/outcomes      Exercise for the Pulmonary Patient:  -Group instruction that is supported by a PowerPoint presentation. Instructor discusses benefits of exercise, core components of exercise,  frequency, duration, and intensity of an exercise routine, importance of utilizing pulse oximetry during exercise, safety while exercising, and options of places to exercise outside of rehab.   Flowsheet Row PULMONARY REHAB OTHER RESPIRATORY from 06/15/2016 in [place]  Date  06/15/16  Educator  EP  Instruction Review Code  2- meets goals/outcomes      Pulmonary Medications:  -Verbally interactive group education provided by instructor with focus on inhaled medications and proper administration.   Anatomy and Physiology of the Respiratory System and Intimacy:  -Group instruction provided by PowerPoint, verbal discussion, and written material to support subject matter. Instructor reviews respiratory cycle and anatomical components of the respiratory system and their functions. Instructor also reviews differences in obstructive and restrictive respiratory diseases with examples of each. Intimacy, Sex, and Sexuality differences are reviewed with a discussion on how relationships can change when diagnosed with pulmonary disease. Common sexual concerns are reviewed.   Knowledge Questionnaire Score:   Core Components/Risk Factors/Patient Goals at Admission:     Personal Goals and Risk Factors at Admission - 04/17/16 1317      Core Components/Risk Factors/Patient Goals on Admission    Weight Management Yes;Obesity   Intervention Weight Management: Develop a combined nutrition and exercise program designed to reach desired caloric intake, while maintaining appropriate intake of nutrient and fiber, sodium and fats, and appropriate energy expenditure required for the weight goal.;Weight Management: Provide education and appropriate resources to help participant work on and attain dietary goals.;Weight Management/Obesity: Establish reasonable short term and long term weight goals.;Obesity: Provide education and appropriate resources to help participant work on and  attain dietary goals.   Admit Weight 230 lb 6.1 oz (104.5 kg)   Goal Weight: Short Term 225 lb (102.1 kg)   Expected Outcomes Short Term: Continue to assess and modify interventions until short term weight is achieved   Increase Strength and Stamina Yes   Intervention Provide advice, education, support and counseling about physical activity/exercise needs.;Develop an individualized exercise prescription for  aerobic and resistive training based on initial evaluation findings, risk stratification, comorbidities and participant's personal goals.   Expected Outcomes Achievement of increased cardiorespiratory fitness and enhanced flexibility, muscular endurance and strength shown through measurements of functional capacity and personal statement of participant.   Improve shortness of breath with ADL's Yes   Intervention Provide education, individualized exercise plan and daily activity instruction to help decrease symptoms of SOB with activities of daily living.   Expected Outcomes Short Term: Achieves a reduction of symptoms when performing activities of daily living.      Core Components/Risk Factors/Patient Goals Review:      Goals and Risk Factor Review    Row Name 05/15/16 1251 06/13/16 1004           Core Components/Risk Factors/Patient Goals Review   Personal Goals Review Weight Management/Obesity;Improve shortness of breath with ADL's;Increase Strength and Stamina Weight Management/Obesity;Improve shortness of breath with ADL's;Increase Strength and Stamina      Review Has not lost any weight, is tolerating exercise well, beginning to increase workloads. has been absent for 2 weeks due to pneumonia, hopefully will be present to exercise this week.  Progress has been slow due to illness. No weight loss.      Expected Outcomes  - expect improvement when recovered from pneumonia.         Core Components/Risk Factors/Patient Goals at Discharge (Final Review):      Goals and Risk Factor  Review - 06/13/16 1004      Core Components/Risk Factors/Patient Goals Review   Personal Goals Review Weight Management/Obesity;Improve shortness of breath with ADL's;Increase Strength and Stamina   Review has been absent for 2 weeks due to pneumonia, hopefully will be present to exercise this week.  Progress has been slow due to illness. No weight loss.   Expected Outcomes expect improvement when recovered from pneumonia.      ITP Comments:   Comments:ITP REVIEW Pt is making expected progress toward pulmonary rehab goals after completing 8 sessions. Recommend continued exercise, life style modification, education, and utilization of breathing techniques to increase stamina and strength and decrease shortness of breath with exertion.

## 2016-06-21 ENCOUNTER — Ambulatory Visit (INDEPENDENT_AMBULATORY_CARE_PROVIDER_SITE_OTHER): Payer: 59 | Admitting: Acute Care

## 2016-06-21 ENCOUNTER — Ambulatory Visit (INDEPENDENT_AMBULATORY_CARE_PROVIDER_SITE_OTHER)
Admission: RE | Admit: 2016-06-21 | Discharge: 2016-06-21 | Disposition: A | Discharge status: Home / Self Care | Payer: 59 | Source: Ambulatory Visit | Attending: Acute Care | Admitting: Acute Care

## 2016-06-21 ENCOUNTER — Encounter: Payer: Self-pay | Admitting: Acute Care

## 2016-06-21 VITALS — BP 126/78 | HR 106 | Ht 63.0 in | Wt 231.0 lb

## 2016-06-21 DIAGNOSIS — J690 Pneumonitis due to inhalation of food and vomit: Secondary | ICD-10-CM

## 2016-06-21 DIAGNOSIS — G4733 Obstructive sleep apnea (adult) (pediatric): Secondary | ICD-10-CM | POA: Diagnosis not present

## 2016-06-21 DIAGNOSIS — J441 Chronic obstructive pulmonary disease with (acute) exacerbation: Secondary | ICD-10-CM

## 2016-06-21 NOTE — Patient Instructions (Addendum)
It is good to see you today. I am glad you are better. CXR on the way out today. Continue using your Breo once daily Continue your Proventil and Duonebs as needed. Continue using your rescue inhaler as needed.  You can try Claritin 10 mg once daily for post nasal drip. Continue on CPAP at bedtime. You appear to be benefiting from the treatment Goal is to wear for at least 6 hours each night for maximal clinical benefit. Continue to work on weight loss, as the link between excess weight  and sleep apnea is well established.  Do not drive if sleepy. Follow up with Elsworth Soho on  or before as needed February 2nd at 11:45 am. Please contact office for sooner follow up if symptoms do not improve or worsen or seek emergency care

## 2016-06-21 NOTE — Assessment & Plan Note (Addendum)
Resolving COPD exacerbation/ ? Left lobe infiltrate Plan: CXR on the way out today to assure CXR has cleared after treatment. Continue using your Breo once daily Rinse after use Continue your Proventil and Duonebs as needed. Continue using your rescue inhaler as needed.  You can try Claritin 10 mg once daily for post nasal drip. Follow up with Elsworth Soho on  or before as needed February 2nd at 11:45 am. Please contact office for sooner follow up if symptoms do not improve or worsen or seek emergency care

## 2016-06-21 NOTE — Assessment & Plan Note (Signed)
Continue on CPAP at bedtime. You appear to be benefiting from the treatment Goal is to wear for at least 6 hours each night for maximal clinical benefit. Continue to work on weight loss, as the link between excess weight  and sleep apnea is well established.  Do not drive if sleepy. Follow up with Dr. Elsworth Soho Feb.5th at 11:45 Please contact office for sooner follow up if symptoms do not improve or worsen or seek emergency care

## 2016-06-21 NOTE — Progress Notes (Signed)
History of Present Illness Deborah [name] is a 60 y.o. female former smoker followed for COPD and OSA/ hypercarbic RF on CPAP by Dr. Elsworth Soho.    06/21/2016 2 week follow up for mild COPD flare/ ? Left base infiltrate.:  Pt.presents to the office today for follow up of a mild COPD flare / ? Left base infiltrate 05/24/2016. She states she is better. She was treated at that time with a prednisone taper, Doxycycline x 7 days, Proventil and Duonebs and Mucinex. She is wearing her oxygen at 2 L. She has an improving productive cough with clear to white secretions.She denies fever, chest pain, orthopnea, leg swelling  or hemoptysis. She states she rarely has to use her rescue inhaler. Averaging once every other day.She is compliant with her CPAP every night.Pneumnvax and flu vaccine are up to date.  Tests:   CXR 06/21/2016:>> Pending CXR: 05/24/2016: Mild left base subsegmental atelectasis. Mild left base infiltrate cannot be excluded.  SIGNIFICANT EVENTS / STUDIES:  05/2013 PFTs- normal ratio, moderate restriction, FVC 54%, FEV1 56%, small airways with significant bronchodilator response, DLCO 60% 1/10 ECHO >>nml LV systolic fxn, dilated hypocontractile RV c/w cor pulmonale 1/12 VQ negative for PE.  1/13 ACE inhibitor changed to ARB and steriods increased yesterday and now wheezing better.   08/2014 PSG - 216 lbs -AHI 7/h, lowest desatn 82%  Past medical hx Past Medical History:  Diagnosis Date  . Anemia    as teen  . Asthma   . CHF (congestive heart failure) ([place])   . COPD, mild ([place])   . Depression   . Diverticulitis   . Family history of anesthesia complication    vomiting  . GI bleeding   . Heart failure ([place])    New onset 07/25/14  . Histoplasmosis    left eye  . Hyperkalemia   . Hypertension   . Obesity (BMI 30-39.9)   . Pneumonia    dx wtih pneumonia on 05/27/16- seen by [facility]   . PONV (postoperative nausea and vomiting)   . Restrictive lung disease   .  Shortness of breath    with exertion   . Sleep apnea    mask and oxygen at nite for sleep at 2L   . Tobacco abuse   . Umbilical hernia      Past surgical hx, Family hx, Social hx all reviewed.  Current Outpatient Prescriptions on File Prior to Visit  Medication Sig  . albuterol (PROVENTIL HFA;VENTOLIN HFA) 108 (90 Base) MCG/ACT inhaler Inhale 2 puffs into the lungs every 6 (six) hours as needed for wheezing or shortness of breath.  Marland Kitchen albuterol (PROVENTIL) (2.5 MG/3ML) 0.083% nebulizer solution USE 1 VIAL VIA NEBULIZER EVERY 3 HOURS AS NEEDED FOR WHEEZING OR SHORTNESS OF BREATH  . aspirin 81 MG chewable tablet Chew 1 tablet (81 mg total) by mouth daily.  Marland Kitchen CARTIA XT 120 MG 24 hr capsule TAKE ONE CAPSULE BY MOUTH DAILY  . fexofenadine (ALLEGRA) 180 MG tablet Take 180 mg by mouth every other day.  . fluticasone furoate-vilanterol (BREO ELLIPTA) 100-25 MCG/INH AEPB Inhale 1 puff into the lungs daily.  . furosemide (LASIX) 20 MG tablet TAKE 2 TABLETS BY MOUTH EVERY MORNING AND 1 TABLET BY MOUTH EVERY EVENING  . loteprednol (LOTEMAX) 0.5 % ophthalmic suspension Place 1 drop into the left eye 3 (three) times daily.  . montelukast (SINGULAIR) 10 MG tablet TAKE 1 TABLET BY MOUTH EVERY NIGHT AT BEDTIME  . potassium chloride SA (K-DUR,KLOR-CON) 20  MEQ tablet TAKE 1/2 TABLET(10 MEQ) BY MOUTH DAILY (Patient taking differently: Take 10 mEq by mouth daily. )  . vitamin B-12 (CYANOCOBALAMIN) 1000 MCG tablet Take 1,000 mcg by mouth daily.   No current facility-administered medications on file prior to visit.      No Known Allergies  Review Of Systems:  Constitutional:   No  weight loss, night sweats,  Fevers, chills, fatigue, or  lassitude.  HEENT:   No headaches,  Difficulty swallowing,  Tooth/dental problems, or  Sore throat,                No sneezing, itching, ear ache, nasal congestion, post nasal drip,   CV:  No chest pain,  Orthopnea, PND, swelling in lower extremities, anasarca, dizziness,  palpitations, syncope.   GI  No heartburn, indigestion, abdominal pain, nausea, vomiting, diarrhea, change in bowel habits, loss of appetite, bloody stools.   Resp: + shortness of breath with exertion less  at rest.  + excess mucus, + productive cough,  No non-productive cough,  No coughing up of blood.  No change in color of mucus.  + wheezing.  No chest wall deformity  Skin: no rash or lesions.  GU: no dysuria, change in color of urine, no urgency or frequency.  No flank pain, no hematuria   MS:  No joint pain or swelling.  No decreased range of motion.  No back pain.  Psych:  No change in mood or affect. No depression or anxiety.  No memory loss.   Vital Signs BP 126/78 (BP Location: Right Arm, Cuff Size: Normal)   Pulse (!) 106   Ht '5\' 3"'$  (1.6 m)   Wt 231 lb (104.8 kg)   SpO2 96%   BMI 40.92 kg/m    Physical Exam:  General- No distress,  A&Ox3, pleasant, obese. ENT: No sinus tenderness, TM clear, pale nasal mucosa, no oral exudate,+ post nasal drip, no LAN Cardiac: S1, S2, regular rate and rhythm, no murmur Chest: + Exp. wheeze/ no rales/ dullness; no accessory muscle use, no nasal flaring, no sternal retractions Abd.: Soft Non-tender Ext: No clubbing cyanosis, trace  Edema lower extremities Neuro:  normal strength Skin: No rashes, warm and dry Psych: normal mood and behavior   Assessment/Plan  COPD exacerbation (HCC) Resolving COPD exacerbation/ ? Left lobe infiltrate Plan: CXR on the way out today. Continue using your Breo once daily Rinse after use Continue your Proventil and Duonebs as needed. Continue using your rescue inhaler as needed.  You can try Claritin 10 mg once daily for post nasal drip. Follow up with Elsworth Soho on  or before as needed February 2nd at 11:45 am. Please contact office for sooner follow up if symptoms do not improve or worsen or seek emergency care    OSA (obstructive sleep apnea) Continue on CPAP at bedtime. You appear to be  benefiting from the treatment Goal is to wear for at least 6 hours each night for maximal clinical benefit. Continue to work on weight loss, as the link between excess weight  and sleep apnea is well established.  Do not drive if sleepy. Follow up with Dr. Elsworth Soho Feb.5th at 11:45 Please contact office for sooner follow up if symptoms do not improve or worsen or seek emergency care      [name], NP 06/21/2016  3:24 PM

## 2016-06-22 ENCOUNTER — Encounter (HOSPITAL_COMMUNITY)
Admission: RE | Admit: 2016-06-22 | Discharge: 2016-06-22 | Disposition: A | Discharge status: Home / Self Care | Payer: 59 | Source: Ambulatory Visit | Attending: Pulmonary Disease | Admitting: Pulmonary Disease

## 2016-06-22 DIAGNOSIS — J984 Other disorders of lung: Secondary | ICD-10-CM

## 2016-06-26 NOTE — Progress Notes (Deleted)
Psychiatric Initial Adult Assessment   Patient Identification: Deborah [name] MRN:  [id number] Date of Evaluation:  06/26/2016 Referral Source: *** Chief Complaint:   Visit Diagnosis: No diagnosis found.  History of Present Illness:   Deborah [name] is a 60 year old female with COPD, sleep apnea, hypoxic respiratory failure due to volume overload from undiagnosed CHF, tobacco abuse in remission, hypertension and history of colonic diverticulitis, who presents for initial appointment.    Associated Signs/Symptoms: Depression Symptoms:  {DEPRESSION SYMPTOMS:20000} (Hypo) Manic Symptoms:  {BHH MANIC SYMPTOMS:22872} Anxiety Symptoms:  {BHH ANXIETY SYMPTOMS:22873} Psychotic Symptoms:  {BHH PSYCHOTIC SYMPTOMS:22874} PTSD Symptoms: {BHH PTSD SYMPTOMS:22875}  Past Psychiatric History: ***  Previous Psychotropic Medications: {YES/NO:21197}  Substance Abuse History in the last 12 months:  {yes no:314532}  Consequences of Substance Abuse: {BHH CONSEQUENCES OF SUBSTANCE ABUSE:22880}  Past Medical History:  Past Medical History:  Diagnosis Date  . Anemia    as teen  . Asthma   . CHF (congestive heart failure) ([place])   . COPD, mild ([place])   . Depression   . Diverticulitis   . Family history of anesthesia complication    vomiting  . GI bleeding   . Heart failure ([place])    New onset 07/25/14  . Histoplasmosis    left eye  . Hyperkalemia   . Hypertension   . Obesity (BMI 30-39.9)   . Pneumonia    dx wtih pneumonia on 05/27/16- seen by [facility]   . PONV (postoperative nausea and vomiting)   . Restrictive lung disease   . Shortness of breath    with exertion   . Sleep apnea    mask and oxygen at nite for sleep at 2L   . Tobacco abuse   . Umbilical hernia     Past Surgical History:  Procedure Laterality Date  . APPLICATION OF WOUND VAC  03/19/2013   Procedure: APPLICATION OF WOUND VAC;  Surgeon: Madilyn Hook, DO;  Location: [facility];  Service: General;;  . CESAREAN SECTION   04/12/85  . COLONOSCOPY WITH PROPOFOL N/A 06/13/2016   Procedure: COLONOSCOPY WITH PROPOFOL;  Surgeon: Jerene Bears, MD;  Location: [facility];  Service: Gastroenterology;  Laterality: N/A;  . INSERTION OF MESH N/A 03/19/2013   Procedure: INSERTION OF MESH;  Surgeon: Madilyn Hook, DO;  Location: [facility];  Service: General;  Laterality: N/A;  . VENTRAL HERNIA REPAIR N/A 03/19/2013   Procedure:  OPEN VENTRAL HERNIA REPAIR WITH MESH AND APPLICATION OF WOUND VAC;  Surgeon: Madilyn Hook, DO;  Location: [facility];  Service: General;  Laterality: N/A;    Family Psychiatric History: ***  Family History:  Family History  Problem Relation Age of Onset  . Heart attack Mother   . Emphysema Mother     was a smoker  . Lymphoma Maternal Uncle   . Pancreatic cancer Maternal Aunt   . Emphysema      Mother  . Heart failure      Father  . Cancer      Unknown  . Colon cancer Neg Hx   . Breast cancer Neg Hx     Social History:   Social History   Social History  . Marital status: Married    Spouse name: N/A  . Number of children: 1  . Years of education: N/A   Occupational History  . Retired Not Employed   Social History Main Topics  . Smoking status: Former Smoker    Packs/day: 3.00    Years: 43.00  Types: Cigarettes    Quit date: 12/17/2012  . Smokeless tobacco: Never Used  . Alcohol use No  . Drug use: No  . Sexual activity: Not on file   Other Topics Concern  . Not on file   Social History Narrative   ** Merged History Encounter **       ** Data from: 07/25/14 Enc Dept: [facility]       ** Data from: 06/05/13 Enc Dept: [facility]   Married, one son 58 yo.             Additional Social History: ***  Allergies:  No Known Allergies  Metabolic Disorder Labs: Lab Results  Component Value Date   HGBA1C 6.2 (H) 07/25/2014   MPG 131 (H) 07/25/2014   No results found for: PROLACTIN Lab Results  Component Value Date   CHOL 200 03/29/2015   TRIG 98.0  03/29/2015   HDL 44.60 03/29/2015   CHOLHDL 4 03/29/2015   VLDL 19.6 03/29/2015   LDLCALC 136 (H) 03/29/2015   LDLCALC 120 (H) 12/13/2012     Current Medications: Current Outpatient Prescriptions  Medication Sig Dispense Refill  . albuterol (PROVENTIL HFA;VENTOLIN HFA) 108 (90 Base) MCG/ACT inhaler Inhale 2 puffs into the lungs every 6 (six) hours as needed for wheezing or shortness of breath. 1 Inhaler 2  . albuterol (PROVENTIL) (2.5 MG/3ML) 0.083% nebulizer solution USE 1 VIAL VIA NEBULIZER EVERY 3 HOURS AS NEEDED FOR WHEEZING OR SHORTNESS OF BREATH 1125 mL 1  . aspirin 81 MG chewable tablet Chew 1 tablet (81 mg total) by mouth daily. 30 tablet 1  . CARTIA XT 120 MG 24 hr capsule TAKE ONE CAPSULE BY MOUTH DAILY 30 capsule 3  . fexofenadine (ALLEGRA) 180 MG tablet Take 180 mg by mouth every other day.    . fluticasone furoate-vilanterol (BREO ELLIPTA) 100-25 MCG/INH AEPB Inhale 1 puff into the lungs daily. 1 each 5  . furosemide (LASIX) 20 MG tablet TAKE 2 TABLETS BY MOUTH EVERY MORNING AND 1 TABLET BY MOUTH EVERY EVENING 45 tablet 5  . loteprednol (LOTEMAX) 0.5 % ophthalmic suspension Place 1 drop into the left eye 3 (three) times daily.    . montelukast (SINGULAIR) 10 MG tablet TAKE 1 TABLET BY MOUTH EVERY NIGHT AT BEDTIME 90 tablet 1  . potassium chloride SA (K-DUR,KLOR-CON) 20 MEQ tablet TAKE 1/2 TABLET(10 MEQ) BY MOUTH DAILY (Patient taking differently: Take 10 mEq by mouth daily. ) 45 tablet 3  . vitamin B-12 (CYANOCOBALAMIN) 1000 MCG tablet Take 1,000 mcg by mouth daily.     No current facility-administered medications for this visit.     Neurologic: Headache: No Seizure: No Paresthesias:No  Musculoskeletal: Strength & Muscle Tone: within normal limits Gait & Station: normal Patient leans: N/A  Psychiatric Specialty Exam: ROS  There were no vitals taken for this visit.There is no height or weight on file to calculate BMI.  General Appearance: Fairly Groomed  Eye  Contact:  Good  Speech:  Clear and Coherent  Volume:  Normal  Mood:  {BHH MOOD:22306}  Affect:  {Affect (PAA):22687}  Thought Process:  Coherent and Goal Directed  Orientation:  Full (Time, Place, and Person)  Thought Content:  Logical  Suicidal Thoughts:  {ST/HT (PAA):22692}  Homicidal Thoughts:  {ST/HT (PAA):22692}  Memory:  Immediate;   Good Recent;   Good Remote;   Good  Judgement:  {Judgement (PAA):22694}  Insight:  {Insight (PAA):22695}  Psychomotor Activity:  Normal  Concentration:  Concentration: Good and Attention Span:  Good  Recall:  Good  Fund of Knowledge:Good  Language: Good  Akathisia:  No  Handed:  Right  AIMS (if indicated):  N/A  Assets:  Communication Skills Desire for Improvement  ADL's:  Intact  Cognition: WNL  Sleep:  ***    Treatment Plan Summary: {CHL AMB BH MD TX LKTG:[id number]}   Norman Clay, MD 12/11/20178:43 AM

## 2016-06-27 ENCOUNTER — Telehealth (HOSPITAL_COMMUNITY): Payer: Self-pay | Admitting: Family Medicine

## 2016-06-27 ENCOUNTER — Encounter (HOSPITAL_COMMUNITY): Payer: 59

## 2016-06-27 NOTE — Progress Notes (Signed)
Daily Session Note  Patient Details  Name: Deborah [name] MRN: [id number] Date of Birth: 09/04/1955 Referring Provider:   April [facility]on Pul[facility] Rehab Walk Test from 04/20/2016 in [place]  Referring Provider  Dr. Elsworth Soho      Encounter Date: 06/22/2016  Check In:   Capillary Blood Glucose: No results found for this or any previous visit (from the past 24 hour(s)).      Exercise Prescription Changes - 06/27/16 1500      Response to Exercise   Blood Pressure (Admit) 98/68   Blood Pressure (Exercise) 104/64   Blood Pressure (Exit) 108/70   Heart Rate (Admit) 107 bpm   Heart Rate (Exercise) 107 bpm   Heart Rate (Exit) 92 bpm   Oxygen Saturation (Admit) 96 %   Oxygen Saturation (Exercise) 97 %   Oxygen Saturation (Exit) 95 %   Rating of Perceived Exertion (Exercise) 12   Perceived Dyspnea (Exercise) 1   Duration Progress to 45 minutes of aerobic exercise without signs/symptoms of physical distress   Intensity THRR unchanged     Progression   Progression Continue to progress workloads to maintain intensity without signs/symptoms of physical distress.     Resistance Training   Training Prescription Yes   Weight green bands   Reps 10-12  10 minutes of strength training     Interval Training   Interval Training No     Oxygen   Oxygen Continuous   Liters 4     Bike   Level 0.5   Minutes 17     NuStep   Level 5   Minutes 17   METs 2.2     Goals Met:  Exercise tolerated well Strength training completed today  Goals Unmet:  Not Applicable  Comments: Service time is from 1330 to 1500    Dr. Rush Farmer is Medical Director for Pulmonary Rehab at [facility].

## 2016-06-27 NOTE — Telephone Encounter (Signed)
Pt cancelled due to stomach virus

## 2016-06-28 ENCOUNTER — Ambulatory Visit (HOSPITAL_COMMUNITY): Payer: Self-pay | Admitting: Psychiatry

## 2016-06-29 ENCOUNTER — Encounter (HOSPITAL_COMMUNITY)
Admission: RE | Admit: 2016-06-29 | Discharge: 2016-06-29 | Disposition: A | Discharge status: Home / Self Care | Payer: 59 | Source: Ambulatory Visit | Attending: Pulmonary Disease | Admitting: Pulmonary Disease

## 2016-06-29 VITALS — Wt 231.7 lb

## 2016-06-29 DIAGNOSIS — J984 Other disorders of lung: Secondary | ICD-10-CM

## 2016-06-29 NOTE — Progress Notes (Signed)
Daily Session Note  Patient Details  Name: Deborah [name] MRN: [id number] Date of Birth: [birth year] Referring Provider:   April [facility]on Pulmonary Rehab Walk Test from 04/20/2016 in [name]  Referring Provider  Dr. Elsworth Soho      Encounter Date: 06/29/2016  Check In:     Session Check In - 06/29/16 1344      Check-In   Location [facility]   Staff Present Su Hilt, MS, ACSM RCEP, Exercise Physiologist;Brave Dack Rollene Rotunda, RN, Maxcine Ham, RN, BSN   Supervising physician immediately available to respond to emergencies [facility] immediately available   Physician(s) Dr. Karleen Hampshire   Medication changes reported     No   Fall or balance concerns reported    No   Warm-up and Cool-down Performed as group-led instruction   Resistance Training Performed Yes   VAD Patient? No     Pain Assessment   Currently in Pain? No/denies   Multiple Pain Sites No      Capillary Blood Glucose: No results found for this or any previous visit (from the past 24 hour(s)).      Exercise Prescription Changes - 06/29/16 1538      Response to Exercise   Blood Pressure (Admit) 100/64   Blood Pressure (Exercise) 124/80   Blood Pressure (Exit) 112/60   Heart Rate (Admit) 103 bpm   Heart Rate (Exercise) 138 bpm   Heart Rate (Exit) 114 bpm   Oxygen Saturation (Admit) 95 %   Oxygen Saturation (Exercise) 95 %   Oxygen Saturation (Exit) 97 %   Rating of Perceived Exertion (Exercise) 13   Perceived Dyspnea (Exercise) 2   Duration Progress to 45 minutes of aerobic exercise without signs/symptoms of physical distress   Intensity THRR unchanged     Progression   Progression Continue to progress workloads to maintain intensity without signs/symptoms of physical distress.     Resistance Training   Training Prescription Yes   Weight green bands   Reps 10-12  10 minutes of strength training     Interval Training   Interval Training No     Oxygen   Oxygen Continuous   Liters 4     Bike   Level 0.5   Minutes 17     Track   Laps 12   Minutes 17     Goals Met:  Improved SOB with ADL's Using PLB without cueing & demonstrates good technique Exercise tolerated well No report of cardiac concerns or symptoms Strength training completed today  Goals Unmet:  Not Applicable  Comments: Service time is from 1330 to 1520   Dr. Rush Farmer is Medical Director for Pulmonary Rehab at [facility].

## 2016-06-30 ENCOUNTER — Other Ambulatory Visit: Payer: Self-pay | Admitting: Cardiovascular Disease

## 2016-06-30 ENCOUNTER — Other Ambulatory Visit: Payer: Self-pay | Admitting: *Deleted

## 2016-06-30 MED ORDER — POTASSIUM CHLORIDE CRYS ER 20 MEQ PO TBCR: EXTENDED_RELEASE_TABLET | ORAL | 3 refills | Status: DC

## 2016-06-30 NOTE — Telephone Encounter (Signed)
Requested Prescriptions   Signed Prescriptions Disp Refills  . potassium chloride SA (K-DUR,KLOR-CON) 20 MEQ tablet 45 tablet 3    Sig: TAKE 1/2 TABLET(10 MEQ) BY MOUTH DAILY    Authorizing Provider: Kathlyn Sacramento A    Ordering User: Britt Bottom

## 2016-07-04 ENCOUNTER — Encounter (HOSPITAL_COMMUNITY)
Admission: RE | Admit: 2016-07-04 | Discharge: 2016-07-04 | Disposition: A | Discharge status: Home / Self Care | Payer: 59 | Source: Ambulatory Visit | Attending: Pulmonary Disease | Admitting: Pulmonary Disease

## 2016-07-04 VITALS — Wt 234.6 lb

## 2016-07-04 DIAGNOSIS — J984 Other disorders of lung: Secondary | ICD-10-CM | POA: Diagnosis not present

## 2016-07-04 NOTE — Progress Notes (Signed)
Daily Session Note  Patient Details  Name: KIYO HEAL MRN: [id number] Date of Birth: December 29, 1955 Referring Provider:   April Manson Pulmonary Rehab Walk Test from 04/20/2016 in [place]  Referring Provider  Dr. Elsworth Soho      Encounter Date: 07/04/2016  Check In:     Session Check In - 07/04/16 1620      Check-In   Location [facility]      Capillary Blood Glucose: No results found for this or any previous visit (from the past 24 hour(s)).      Exercise Prescription Changes - 07/04/16 1600      Exercise Review   Progression Yes     Response to Exercise   Blood Pressure (Admit) 102/70   Blood Pressure (Exercise) 102/64   Blood Pressure (Exit) 100/68   Heart Rate (Admit) 98 bpm   Heart Rate (Exercise) 137 bpm   Heart Rate (Exit) 105 bpm   Oxygen Saturation (Admit) 98 %   Oxygen Saturation (Exercise) 94 %   Oxygen Saturation (Exit) 98 %   Rating of Perceived Exertion (Exercise) 13   Perceived Dyspnea (Exercise) 3   Duration Progress to 45 minutes of aerobic exercise without signs/symptoms of physical distress   Intensity THRR unchanged     Progression   Progression Continue to progress workloads to maintain intensity without signs/symptoms of physical distress.     Resistance Training   Training Prescription Yes   Weight green bands   Reps 10-12  10 minutes of strength training     Interval Training   Interval Training No     Oxygen   Oxygen Continuous   Liters 4     Bike   Level 0.5   Minutes 17     NuStep   Level 6   Minutes 17   METs 2.5     Track   Laps 12   Minutes 17     Goals Met:  Exercise tolerated well No report of cardiac concerns or symptoms Strength training completed today  Goals Unmet:  Not Applicable  Comments: Service time is from 1:30pm to 3:00pm    Dr. Rush Farmer is Medical Director for Pulmonary Rehab at [facility].

## 2016-07-06 ENCOUNTER — Encounter (HOSPITAL_COMMUNITY)
Admission: RE | Admit: 2016-07-06 | Discharge: 2016-07-06 | Disposition: A | Discharge status: Home / Self Care | Payer: 59 | Source: Ambulatory Visit | Attending: Pulmonary Disease | Admitting: Pulmonary Disease

## 2016-07-06 VITALS — Wt 238.1 lb

## 2016-07-06 DIAGNOSIS — J984 Other disorders of lung: Secondary | ICD-10-CM | POA: Diagnosis not present

## 2016-07-06 NOTE — Progress Notes (Signed)
Daily Session Note  Patient Details  Name: Deborah [name] MRN: [id number] Date of Birth: 1956-03-24 Referring Provider:   April Manson Pulmonary Rehab Walk Test from 04/20/2016 in [place]  Referring Provider  Dr. Elsworth Soho      Encounter Date: 07/06/2016  Check In:     Session Check In - 07/06/16 1337      Check-In   Location [facility]   Staff Present Su Hilt, MS, ACSM RCEP, Exercise Physiologist;Joan Leonia Reeves, RN, Luisa Hart, RN, BSN   Supervising physician immediately available to respond to emergencies [facility] immediately available   Physician(s) Dr. Ree Kida   Medication changes reported     No   Fall or balance concerns reported    No   Warm-up and Cool-down Performed as group-led instruction   Resistance Training Performed Yes   VAD Patient? No     Pain Assessment   Currently in Pain? No/denies   Multiple Pain Sites No      Capillary Blood Glucose: No results found for this or any previous visit (from the past 24 hour(s)).      Exercise Prescription Changes - 07/06/16 1554      Response to Exercise   Blood Pressure (Admit) 124/70   Blood Pressure (Exercise) 120/80   Blood Pressure (Exit) 118/74   Heart Rate (Admit) 89 bpm   Heart Rate (Exercise) 139 bpm   Heart Rate (Exit) 111 bpm  102 at discharge   Oxygen Saturation (Admit) 96 %   Oxygen Saturation (Exercise) 91 %   Oxygen Saturation (Exit) 97 %   Rating of Perceived Exertion (Exercise) 13   Perceived Dyspnea (Exercise) 3   Duration Progress to 45 minutes of aerobic exercise without signs/symptoms of physical distress   Intensity THRR unchanged     Progression   Progression Continue to progress workloads to maintain intensity without signs/symptoms of physical distress.     Resistance Training   Training Prescription Yes   Weight green bands   Reps 10-12  10 minutes of strength training     Interval Training   Interval  Training No     Oxygen   Oxygen Continuous   Liters 4     NuStep   Level 6   Minutes 17   METs 2.5     Track   Laps 12   Minutes 17     Goals Met:  Independence with exercise equipment Improved SOB with ADL's Using PLB without cueing & demonstrates good technique Exercise tolerated well No report of cardiac concerns or symptoms Strength training completed today  Goals Unmet:  Not Applicable  Comments: Service time is from 1330 to 1500   Dr. Rush Farmer is Medical Director for Pulmonary Rehab at [facility].

## 2016-07-11 ENCOUNTER — Encounter (HOSPITAL_COMMUNITY)
Admission: RE | Admit: 2016-07-11 | Discharge: 2016-07-11 | Disposition: A | Discharge status: Home / Self Care | Payer: 59 | Source: Ambulatory Visit | Attending: Pulmonary Disease | Admitting: Pulmonary Disease

## 2016-07-11 VITALS — Wt 233.7 lb

## 2016-07-11 DIAGNOSIS — J984 Other disorders of lung: Secondary | ICD-10-CM

## 2016-07-11 NOTE — Progress Notes (Signed)
Daily Session Note  Patient Details  Name: Deborah [name] MRN: [id number] Date of Birth: 17-Dec-1955 Referring Provider:   April Manson [facility] Walk Test from 04/20/2016 in [place]  Referring Provider  Dr. Elsworth Soho      Encounter Date: 07/11/2016  Check In:     Session Check In - 07/11/16 1351      Check-In   Location [facility]   Staff Present Rosebud Poles, RN, Luisa Hart, RN, Deland Pretty, MS, ACSM CEP, Exercise Physiologist   Supervising physician immediately available to respond to emergencies [facility] immediately available   Physician(s) Dr. Ree Kida   Medication changes reported     No   Fall or balance concerns reported    No   Warm-up and Cool-down Performed as group-led instruction   Resistance Training Performed Yes   VAD Patient? No     Pain Assessment   Currently in Pain? No/denies   Multiple Pain Sites No      Capillary Blood Glucose: No results found for this or any previous visit (from the past 24 hour(s)).      Exercise Prescription Changes - 07/11/16 1500      Response to Exercise   Blood Pressure (Admit) 106/62   Blood Pressure (Exercise) 116/82   Blood Pressure (Exit) 110/60   Heart Rate (Admit) 94 bpm   Heart Rate (Exercise) 118 bpm   Heart Rate (Exit) 95 bpm   Oxygen Saturation (Admit) 94 %   Oxygen Saturation (Exercise) 94 %   Oxygen Saturation (Exit) 95 %   Rating of Perceived Exertion (Exercise) 13   Perceived Dyspnea (Exercise) 1   Duration Progress to 45 minutes of aerobic exercise without signs/symptoms of physical distress   Intensity THRR unchanged     Progression   Progression Continue to progress workloads to maintain intensity without signs/symptoms of physical distress.     Resistance Training   Training Prescription Yes   Weight green bands   Reps 10-12  10 minutes of strength training     Interval Training   Interval Training No     Oxygen   Oxygen Continuous   Liters 4     Bike   Level 0.5   Minutes 17     NuStep   Level 6   Minutes 17   METs 2.6     Track   Laps 10   Minutes 17     Goals Met:  Exercise tolerated well Strength training completed today  Goals Unmet:  Not Applicable  Comments: Service time is from 1330 to 1500    Dr. Rush Farmer is Medical Director for Pulmonary Rehab at [facility].

## 2016-07-13 ENCOUNTER — Encounter (HOSPITAL_COMMUNITY)
Admission: RE | Admit: 2016-07-13 | Discharge: 2016-07-13 | Disposition: A | Discharge status: Home / Self Care | Payer: 59 | Source: Ambulatory Visit | Attending: Pulmonary Disease | Admitting: Pulmonary Disease

## 2016-07-13 ENCOUNTER — Telehealth (HOSPITAL_COMMUNITY): Payer: Self-pay | Admitting: Family Medicine

## 2016-07-13 DIAGNOSIS — J984 Other disorders of lung: Secondary | ICD-10-CM

## 2016-07-13 NOTE — Telephone Encounter (Signed)
Pt cancelled due to personal reasons... KJ

## 2016-07-13 NOTE — Progress Notes (Signed)
Pulmonary Individual Treatment Plan  Patient Details  Name: Deborah [name] MRN: [id number] Date of Birth: Nov 23, 1955 Referring Provider:   April Manson Pulmonary Rehab Walk Test from 04/20/2016 in [place]  Referring Provider  Dr. Elsworth Soho      Initial Encounter Date:  Flowsheet Row Pulmonary Rehab Walk Test from 04/20/2016 in [place]  Date  04/20/16  Referring Provider  Dr. Elsworth Soho      Visit Diagnosis: Restrictive lung disease  Patient's Home Medications on Admission:   Current Outpatient Prescriptions:  .  albuterol (PROVENTIL HFA;VENTOLIN HFA) 108 (90 Base) MCG/ACT inhaler, Inhale 2 puffs into the lungs every 6 (six) hours as needed for wheezing or shortness of breath., Disp: 1 Inhaler, Rfl: 2 .  albuterol (PROVENTIL) (2.5 MG/3ML) 0.083% nebulizer solution, USE 1 VIAL VIA NEBULIZER EVERY 3 HOURS AS NEEDED FOR WHEEZING OR SHORTNESS OF BREATH, Disp: 1125 mL, Rfl: 1 .  aspirin 81 MG chewable tablet, Chew 1 tablet (81 mg total) by mouth daily., Disp: 30 tablet, Rfl: 1 .  CARTIA XT 120 MG 24 hr capsule, TAKE ONE CAPSULE BY MOUTH DAILY, Disp: 30 capsule, Rfl: 3 .  fexofenadine (ALLEGRA) 180 MG tablet, Take 180 mg by mouth every other day., Disp: , Rfl:  .  fluticasone furoate-vilanterol (BREO ELLIPTA) 100-25 MCG/INH AEPB, Inhale 1 puff into the lungs daily., Disp: 1 each, Rfl: 5 .  furosemide (LASIX) 20 MG tablet, TAKE 2 TABLETS BY MOUTH EVERY MORNING AND 1 TABLET BY MOUTH EVERY EVENING, Disp: 45 tablet, Rfl: 5 .  loteprednol (LOTEMAX) 0.5 % ophthalmic suspension, Place 1 drop into the left eye 3 (three) times daily., Disp: , Rfl:  .  montelukast (SINGULAIR) 10 MG tablet, TAKE 1 TABLET BY MOUTH EVERY NIGHT AT BEDTIME, Disp: 90 tablet, Rfl: 1 .  potassium chloride SA (K-DUR,KLOR-CON) 20 MEQ tablet, TAKE 1/2 TABLET(10 MEQ) BY MOUTH DAILY, Disp: 45 tablet, Rfl: 3 .  vitamin B-12 (CYANOCOBALAMIN) 1000 MCG tablet, Take 1,000 mcg  by mouth daily., Disp: , Rfl:   Past Medical History: Past Medical History:  Diagnosis Date  . Anemia    as teen  . Asthma   . CHF (congestive heart failure) ([place])   . COPD, mild ([place])   . Depression   . Diverticulitis   . Family history of anesthesia complication    vomiting  . GI bleeding   . Heart failure ([place])    New onset 07/25/14  . Histoplasmosis    left eye  . Hyperkalemia   . Hypertension   . Obesity (BMI 30-39.9)   . Pneumonia    dx wtih pneumonia on 05/27/16- seen by [facility]   . PONV (postoperative nausea and vomiting)   . Restrictive lung disease   . Shortness of breath    with exertion   . Sleep apnea    mask and oxygen at nite for sleep at 2L   . Tobacco abuse   . Umbilical hernia     Tobacco Use: History  Smoking Status  . Former Smoker  . Packs/day: 3.00  . Years: 43.00  . Types: Cigarettes  . Quit date: 12/17/2012  Smokeless Tobacco  . Never Used    Labs: Recent Review Flowsheet Data    Labs for ITP Cardiac and Pulmonary Rehab Latest Ref Rng & Units 03/29/2015 12/18/2015 12/18/2015 12/18/2015 12/18/2015   Cholestrol 0 - 200 mg/dL 200 - - - -   LDLCALC 0 - 99 mg/dL 136(H) - - - -  HDL >39.00 mg/dL 44.60 - - - -   Trlycerides 0.0 - 149.0 mg/dL 98.0 - - - -   Hemoglobin A1c <5.7 % - - - - -   PHART 7.350 - 7.450 - - 7.289(L) 7.253(L) 7.279(L)   PCO2ART 35.0 - 45.0 mmHg - - 74.8(HH) 85.4(HH) 77.8(HH)   HCO3 20.0 - 24.0 mEq/L - 35.6(H) 34.7(H) 36.4(H) 35.3(H)   TCO2 0 - 100 mmol/L - 31.6 31.2 33.2 32.0   O2SAT % - 87.5 90.5 92.7 93.0      Capillary Blood Glucose: Lab Results  Component Value Date   GLUCAP 117 (H) 03/22/2013   GLUCAP 108 (H) 03/22/2013   GLUCAP 112 (H) 03/21/2013   GLUCAP 117 (H) 03/21/2013   GLUCAP 108 (H) 03/21/2013     ADL UCSD:   Pulmonary Function Assessment:     Pulmonary Function Assessment - 04/17/16 1316      Breath   Bilateral Breath Sounds Clear   Shortness of Breath Yes      Exercise Target Goals:     Exercise Program Goal: Individual exercise prescription set with THRR, safety & activity barriers. Participant demonstrates ability to understand and report RPE using BORG scale, to self-measure pulse accurately, and to acknowledge the importance of the exercise prescription.  Exercise Prescription Goal: Starting with aerobic activity 30 plus minutes a day, 3 days per week for initial exercise prescription. Provide home exercise prescription and guidelines that participant acknowledges understanding prior to discharge.  Activity Barriers & Risk Stratification:     Activity Barriers & Cardiac Risk Stratification - 04/17/16 1313      Activity Barriers & Cardiac Risk Stratification   Activity Barriers Arthritis;Back Problems      6 Minute Walk:     6 Minute Walk    Row Name 04/20/16 1638         6 Minute Walk   Phase Initial     Distance 1355 feet     Walk Time 6 minutes     # of Rest Breaks 0     MPH 2.56     METS 2.91     RPE 15     Perceived Dyspnea  3     Symptoms No     Resting HR 107 bpm     Resting BP 94/64     Max Ex. HR 154 bpm     Max Ex. BP 124/76       Interval HR   Baseline HR 107     1 Minute HR 117     2 Minute HR 125     3 Minute HR 131     4 Minute HR 132     5 Minute HR 135     6 Minute HR 154     2 Minute Post HR 146     Interval Heart Rate? Yes       Interval Oxygen   Interval Oxygen? Yes     Baseline Oxygen Saturation % 97 %     Baseline Liters of Oxygen 4 L     1 Minute Oxygen Saturation % 96 %     1 Minute Liters of Oxygen 4 L     2 Minute Oxygen Saturation % 93 %     2 Minute Liters of Oxygen 4 L     3 Minute Oxygen Saturation % 92 %     3 Minute Liters of Oxygen 4 L     4 Minute Oxygen Saturation %  93 %     4 Minute Liters of Oxygen 4 L     5 Minute Oxygen Saturation % 93 %     5 Minute Liters of Oxygen 4 L     6 Minute Oxygen Saturation % 92 %     6 Minute Liters of Oxygen 4 L     2 Minute Post Oxygen Saturation % 96 %      2 Minute Post Liters of Oxygen 4 L        Initial Exercise Prescription:     Initial Exercise Prescription - 04/20/16 1600      Date of Initial Exercise RX and Referring Provider   Date 04/20/16   Referring Provider Dr. Elsworth Soho     Oxygen   Oxygen Continuous   Liters 4     Bike   Level 0.5   Minutes 17     NuStep   Level 2   Minutes 17   METs 1.6     Track   Laps 5   Minutes 17     Prescription Details   Frequency (times per week) 2   Duration Progress to 45 minutes of aerobic exercise without signs/symptoms of physical distress     Intensity   THRR 40-80% of Max Heartrate 65-131   Ratings of Perceived Exertion 11-13   Perceived Dyspnea 0-4     Progression   Progression Continue progressive overload as per policy without signs/symptoms or physical distress.     Resistance Training   Training Prescription Yes   Weight green bands   Reps 10-12      Perform Capillary Blood Glucose checks as needed.  Exercise Prescription Changes:     Exercise Prescription Changes    Row Name 05/02/16 1500 05/04/16 1600 05/09/16 1500 05/11/16 1600 05/16/16 1600     Exercise Review   Progression  - Yes Yes  -  -     Response to Exercise   Blood Pressure (Admit) 102/70 104/66 102/60 120/70 114/64   Blood Pressure (Exercise) 130/70 132/70 100/70 124/80 130/80   Blood Pressure (Exit) 98/78 110/62 100/60 100/66 106/60   Heart Rate (Admit) 89 bpm 89 bpm 86 bpm 116 bpm  pt forgot to take her medication today, will take. 112 bpm   Heart Rate (Exercise) 141 bpm 137 bpm 114 bpm 140 bpm 121 bpm   Heart Rate (Exit) 108 bpm 107 bpm 93 bpm 112 bpm 113 bpm   Oxygen Saturation (Admit) 96 % 92 % 96 % 95 % 96 %   Oxygen Saturation (Exercise) 94 % 95 % 94 % 92 % 95 %   Oxygen Saturation (Exit) 95 % 97 % 93 % 96 % 94 %   Rating of Perceived Exertion (Exercise) _0 Perceived Dyspnea (Exercise) _1 Duration Progress to 45 minutes of aerobic exercise without  signs/symptoms of physical distress Progress to 45 minutes of aerobic exercise without signs/symptoms of physical distress Progress to 45 minutes of aerobic exercise without signs/symptoms of physical distress Progress to 45 minutes of aerobic exercise without signs/symptoms of physical distress Progress to 45 minutes of aerobic exercise without signs/symptoms of physical distress   Intensity -  40-80% of HRR THRR unchanged THRR unchanged THRR unchanged THRR unchanged     Progression   Progression Continue to progress workloads to maintain intensity without signs/symptoms of physical distress. Continue to progress workloads to maintain intensity without signs/symptoms of physical distress. Continue  to progress workloads to maintain intensity without signs/symptoms of physical distress. Continue to progress workloads to maintain intensity without signs/symptoms of physical distress. Continue to progress workloads to maintain intensity without signs/symptoms of physical distress.     Resistance Training   Training Prescription _0    Weight _1    Reps 10-12  10 minutes of strength training 10-12  10 minutes of strength training 10-12  10 minutes of strength training 10-12  10 minutes of strength training 10-12  10 minutes of strength training     Interval Training   Interval Training _2      Oxygen   Oxygen _3    Liters _4 Bike   Level 0.5  - 0.5 0.5 0.5   Minutes 17  - _5 NuStep   Level _6 - 4   Minutes _7 - 17   METs 1.5 1.6 1.7  - 2     Track   Laps _8 -   Minutes _9 -   Row Name 05/18/16 1624 05/23/16 1600 06/15/16 1600 06/20/16 1500 06/20/16 1554     Exercise Review   Progression  - Yes  -  -  -     Response to Exercise   Blood Pressure (Admit) 102/70 122/76 114/84  - 112/68   Blood Pressure  (Exercise) 118/77 106/70 100/62  - 110/84   Blood Pressure (Exit) 102/72 100/64 100/60  - 102/68   Heart Rate (Admit) 82 bpm 111 bpm 100 bpm  - 117 bpm   Heart Rate (Exercise) 117 bpm 151 bpm 121 bpm  - 146 bpm   Heart Rate (Exit) 97 bpm 78 bpm 99 bpm  - 112 bpm   Oxygen Saturation (Admit) 94 % 95 % 92 %  - 90 %   Oxygen Saturation (Exercise) 95 % 94 % 95 %  - 95 %   Oxygen Saturation (Exit) 93 % 97 % 91 %  - 96 %   Rating of Perceived Exertion (Exercise) _10 - 13   Perceived Dyspnea (Exercise) 0 2 1  - 3   Duration Progress to 45 minutes of aerobic exercise without signs/symptoms of physical distress Progress to 45 minutes of aerobic exercise without signs/symptoms of physical distress Progress to 45 minutes of aerobic exercise without signs/symptoms of physical distress  - Progress to 45 minutes of aerobic exercise without signs/symptoms of physical distress   Intensity THRR unchanged THRR unchanged THRR unchanged  - THRR unchanged     Progression   Progression Continue to progress workloads to maintain intensity without signs/symptoms of physical distress. Continue to progress workloads to maintain intensity without signs/symptoms of physical distress. Continue to progress workloads to maintain intensity without signs/symptoms of physical distress.  - Continue to progress workloads to maintain intensity without signs/symptoms of physical distress.     Resistance Training   Training Prescription Yes Yes Yes  - Yes   Weight green bands green bands green bands  - green bands   Reps 10-12  10 minutes of strength training 10-12  10 minutes of strength training 10-12  10 minutes of strength training  - 10-12  10 minutes of strength training     Interval Training   Interval Training No No  No  - No     Oxygen   Oxygen Continuous Continuous Continuous  - Continuous   Liters _0 - 4     Bike   Level 0.5 0.5 0.5  - 0.5   Minutes _1 - 17     NuStep   Level _2 - 5    Minutes _3 - 17   METs 2.1 1.5 2.6  - 2.3     Track   Laps  - 11  -  - 7   Minutes  - 17  -  - 17     Home Exercise Plan   Plans to continue exercise at  -  -  - Home  -   Frequency  -  -  - Add 3 additional days to program exercise sessions.  -   Row Name 06/27/16 1500 06/29/16 1538 07/04/16 1600 07/06/16 1554 07/11/16 1500     Exercise Review   Progression  -  - Yes  -  -     Response to Exercise   Blood Pressure (Admit) 98/68 100/64 102/70 124/70 106/62   Blood Pressure (Exercise) 104/64 124/80 102/64 120/80 116/82   Blood Pressure (Exit) 108/70 112/60 100/68 118/74 110/60   Heart Rate (Admit) 107 bpm 103 bpm 98 bpm 89 bpm 94 bpm   Heart Rate (Exercise) 107 bpm 138 bpm 137 bpm 139 bpm 118 bpm   Heart Rate (Exit) 92 bpm 114 bpm 105 bpm 111 bpm  102 at discharge 95 bpm   Oxygen Saturation (Admit) 96 % 95 % 98 % 96 % 94 %   Oxygen Saturation (Exercise) 97 % 95 % 94 % 91 % 94 %   Oxygen Saturation (Exit) 95 % 97 % 98 % 97 % 95 %   Rating of Perceived Exertion (Exercise) _4 Perceived Dyspnea (Exercise) _5 Duration Progress to 45 minutes of aerobic exercise without signs/symptoms of physical distress Progress to 45 minutes of aerobic exercise without signs/symptoms of physical distress Progress to 45 minutes of aerobic exercise without signs/symptoms of physical distress Progress to 45 minutes of aerobic exercise without signs/symptoms of physical distress Progress to 45 minutes of aerobic exercise without signs/symptoms of physical distress   Intensity _6      Progression   Progression Continue to progress workloads to maintain intensity without signs/symptoms of physical distress. Continue to progress workloads to maintain intensity without signs/symptoms of physical distress. Continue to progress workloads to maintain intensity without signs/symptoms of physical distress. Continue to  progress workloads to maintain intensity without signs/symptoms of physical distress. Continue to progress workloads to maintain intensity without signs/symptoms of physical distress.     Resistance Training   Training Prescription _7    Weight _8    Reps 10-12  10 minutes of strength training 10-12  10 minutes of strength training 10-12  10 minutes of strength training 10-12  10 minutes of strength training 10-12  10 minutes of strength training     Interval Training   Interval Training _9      Oxygen   Oxygen _10    Liters _11 Bike   Level 0.5 0.5 0.5  - 0.5  Minutes '17 17 17  '$ - 17     NuStep   Level 5  - '6 6 6   '$ Minutes 17  - '17 17 17   '$ METs 2.2  - 2.5 2.5 2.6     Track   Laps  - '12 12 12 10   '$ Minutes  - '17 17 17 17      '$ Exercise Comments:     Exercise Comments    Row Name 05/22/16 1030 06/19/16 1636 06/20/16 1558 07/06/16 1001     Exercise Comments Patient is very open to increasing workloads. Will cont. to monitor and progress.  Patient has had a string of absences due to illness, Just returned on 11/30. Will cont. to monitor and progress. Home exercise program completed Patient is progressing very well in program. Very open to making changes. Will cont. to monitor and progress.         Discharge Exercise Prescription (Final Exercise Prescription Changes):     Exercise Prescription Changes - 07/11/16 1500      Response to Exercise   Blood Pressure (Admit) 106/62   Blood Pressure (Exercise) 116/82   Blood Pressure (Exit) 110/60   Heart Rate (Admit) 94 bpm   Heart Rate (Exercise) 118 bpm   Heart Rate (Exit) 95 bpm   Oxygen Saturation (Admit) 94 %   Oxygen Saturation (Exercise) 94 %   Oxygen Saturation (Exit) 95 %   Rating of Perceived Exertion (Exercise) 13   Perceived Dyspnea (Exercise) 1   Duration Progress to  45 minutes of aerobic exercise without signs/symptoms of physical distress   Intensity THRR unchanged     Progression   Progression Continue to progress workloads to maintain intensity without signs/symptoms of physical distress.     Resistance Training   Training Prescription Yes   Weight green bands   Reps 10-12  10 minutes of strength training     Interval Training   Interval Training No     Oxygen   Oxygen Continuous   Liters 4     Bike   Level 0.5   Minutes 17     NuStep   Level 6   Minutes 17   METs 2.6     Track   Laps 10   Minutes 17       Nutrition:  Target Goals: Understanding of nutrition guidelines, daily intake of sodium '1500mg'$ , cholesterol '200mg'$ , calories 30% from fat and 7% or less from saturated fats, daily to have 5 or more servings of fruits and vegetables.  Biometrics:     Pre Biometrics - 04/17/16 1314      Pre Biometrics   Grip Strength 29 kg       Nutrition Therapy Plan and Nutrition Goals:     Nutrition Therapy & Goals - 05/09/16 1039      Nutrition Therapy   Diet Low Sodium     Personal Nutrition Goals   Personal Goal #1 1-2 lb wt loss/week to a wt loss goal of 6-24 lb at graduation from [facility], educate and counsel regarding individualized specific dietary modifications aiming towards targeted core components such as weight, hypertension, lipid management, diabetes, heart failure and other comorbidities.   Expected Outcomes Short Term Goal: Understand basic principles of dietary content, such as calories, fat, sodium, cholesterol and nutrients.;Long Term Goal: Adherence to prescribed nutrition plan.      Nutrition Discharge: Rate Your Plate Scores:  Nutrition Assessments - 05/16/16 1457      Rate Your Plate Scores   Pre Score 44      Psychosocial: Target Goals: Acknowledge presence or absence of depression, maximize coping skills, provide positive support  system. Participant is able to verbalize types and ability to use techniques and skills needed for reducing stress and depression.  Initial Review & Psychosocial Screening:     Initial Psych Review & Screening - 04/17/16 1321      Initial Review   Current issues with Current Depression;History of Depression     Family Dynamics   Good Support System? Yes     Barriers   Psychosocial barriers to participate in program Psychosocial barriers identified (see note)  will contact Dr. Dayton Martes PCP for depression issues     Screening Interventions   Interventions Encouraged to exercise      Quality of Life Scores:   PHQ-9: Recent Review Flowsheet Data    Depression screen [facility] 2/9 04/17/2016   Decreased Interest 3   Down, Depressed, Hopeless 3   PHQ - 2 Score 6   Altered sleeping 3   Tired, decreased energy 3   Change in appetite 2   Feeling bad or failure about yourself  3   Trouble concentrating 0   Moving slowly or fidgety/restless 0   Suicidal thoughts 1   PHQ-9 Score 18   Difficult doing work/chores Very difficult      Psychosocial Evaluation and Intervention:     Psychosocial Evaluation - 04/17/16 1323      Psychosocial Evaluation & Interventions   Interventions Physician referral   Comments Will contact Dr. Dayton Martes for depression issues   Continued Psychosocial Services Needed Yes      Psychosocial Re-Evaluation:     Psychosocial Re-Evaluation    Row Name 05/15/16 1254 06/13/16 1006 07/11/16 0905         Psychosocial Re-Evaluation   Interventions Therapist referral;Encouraged to attend Pulmonary Rehabilitation for the exercise Therapist referral;Encouraged to attend Pulmonary Rehabilitation for the exercise Encouraged to attend Pulmonary Rehabilitation for the exercise     Comments Had a reaction in the past 2 2 different antidepressants, is currently seeing a counselor for suicidal thoughts. No antidepressant meds for now due to adverse reactions, is seeing a  Veterinary surgeon. No issues with depression at this time, still seeing counselor     Continued Psychosocial Services Needed Yes Yes Yes       Education: Education Goals: Education classes will be provided on a weekly basis, covering required topics. Participant will state understanding/return demonstration of topics presented.  Learning Barriers/Preferences:     Learning Barriers/Preferences - 04/17/16 1313      Learning Barriers/Preferences   Learning Barriers None   Learning Preferences Pictoral;Verbal Instruction      Education Topics: Risk Factor Reduction:  -Group instruction that is supported by a PowerPoint presentation. Instructor discusses the definition of a risk factor, different risk factors for pulmonary disease, and how the heart and lungs work together.     Nutrition for Pulmonary Patient:  -Group instruction provided by PowerPoint slides, verbal discussion, and written materials to support subject matter. The instructor gives an explanation and review of healthy diet recommendations, which includes a discussion on weight management, recommendations for fruit and vegetable consumption, as well as protein, fluid, caffeine, fiber, sodium, sugar, and alcohol. Tips for eating when patients are short of breath are discussed.   Pursed Lip Breathing:  -Group instruction that is supported by demonstration and informational handouts.  Instructor discusses the benefits of pursed lip and diaphragmatic breathing and detailed demonstration on how to preform both.     Oxygen Safety:  -Group instruction provided by PowerPoint, verbal discussion, and written material to support subject matter. There is an overview of "What is Oxygen" and "Why do we need it".  Instructor also reviews how to create a safe environment for oxygen use, the importance of using oxygen as prescribed, and the risks of noncompliance. There is a brief discussion on traveling with oxygen and resources the patient may  utilize. Flowsheet Row PULMONARY REHAB OTHER RESPIRATORY from 07/06/2016 in [place]  Date  05/18/16  Educator  rn  Instruction Review Code  2- meets goals/outcomes      Oxygen Equipment:  -Group instruction provided by [employer] Staff utilizing handouts, written materials, and equipment demonstrations.   Signs and Symptoms:  -Group instruction provided by written material and verbal discussion to support subject matter. Warning signs and symptoms of infection, stroke, and heart attack are reviewed and when to call the physician/911 reinforced. Tips for preventing the spread of infection discussed. Flowsheet Row PULMONARY REHAB OTHER RESPIRATORY from 07/06/2016 in [place]  Date  06/22/16  Educator  rn  Instruction Review Code  2- meets goals/outcomes      Advanced Directives:  -Group instruction provided by verbal instruction and written material to support subject matter. Instructor reviews Advanced Directive laws and proper instruction for filling out document.   Pulmonary Video:  -Group video education that reviews the importance of medication and oxygen compliance, exercise, good nutrition, pulmonary hygiene, and pursed lip and diaphragmatic breathing for the pulmonary patient. Flowsheet Row PULMONARY REHAB OTHER RESPIRATORY from 07/06/2016 in [place]  Date  05/04/16  Educator  video  Instruction Review Code  2- meets goals/outcomes      Exercise for the Pulmonary Patient:  -Group instruction that is supported by a PowerPoint presentation. Instructor discusses benefits of exercise, core components of exercise, frequency, duration, and intensity of an exercise routine, importance of utilizing pulse oximetry during exercise, safety while exercising, and options of places to exercise outside of rehab.   Flowsheet Row PULMONARY REHAB OTHER RESPIRATORY from 07/06/2016 in [place]  Date  06/15/16  Educator  EP  Instruction Review Code  2- meets goals/outcomes      Pulmonary Medications:  -Verbally interactive group education provided by instructor with focus on inhaled medications and proper administration.   Anatomy and Physiology of the Respiratory System and Intimacy:  -Group instruction provided by PowerPoint, verbal discussion, and written material to support subject matter. Instructor reviews respiratory cycle and anatomical components of the respiratory system and their functions. Instructor also reviews differences in obstructive and restrictive respiratory diseases with examples of each. Intimacy, Sex, and Sexuality differences are reviewed with a discussion on how relationships can change when diagnosed with pulmonary disease. Common sexual concerns are reviewed. Flowsheet Row PULMONARY REHAB OTHER RESPIRATORY from 07/06/2016 in [place]  Date  06/22/16  Educator  rn  Instruction Review Code  2- meets goals/outcomes      Knowledge Questionnaire Score:   Core Components/Risk Factors/Patient Goals at Admission:     Personal Goals and Risk Factors at Admission - 04/17/16 1317      Core Components/Risk Factors/Patient Goals on Admission    Weight Management Yes;Obesity   Intervention Weight Management: Develop a combined nutrition and  exercise program designed to reach desired caloric intake, while maintaining appropriate intake of nutrient and fiber, sodium and fats, and appropriate energy expenditure required for the weight goal.;Weight Management: Provide education and appropriate resources to help participant work on and attain dietary goals.;Weight Management/Obesity: Establish reasonable short term and long term weight goals.;Obesity: Provide education and appropriate resources to help participant work on and attain dietary goals.   Admit Weight 230 lb 6.1 oz (104.5 kg)   Goal  Weight: Short Term 225 lb (102.1 kg)   Expected Outcomes Short Term: Continue to assess and modify interventions until short term weight is achieved   Increase Strength and Stamina Yes   Intervention Provide advice, education, support and counseling about physical activity/exercise needs.;Develop an individualized exercise prescription for aerobic and resistive training based on initial evaluation findings, risk stratification, comorbidities and participant's personal goals.   Expected Outcomes Achievement of increased cardiorespiratory fitness and enhanced flexibility, muscular endurance and strength shown through measurements of functional capacity and personal statement of participant.   Improve shortness of breath with ADL's Yes   Intervention Provide education, individualized exercise plan and daily activity instruction to help decrease symptoms of SOB with activities of daily living.   Expected Outcomes Short Term: Achieves a reduction of symptoms when performing activities of daily living.      Core Components/Risk Factors/Patient Goals Review:      Goals and Risk Factor Review    Row Name 05/15/16 1251 06/13/16 1004 07/11/16 0903         Core Components/Risk Factors/Patient Goals Review   Personal Goals Review Weight Management/Obesity;Improve shortness of breath with ADL's;Increase Strength and Stamina Weight Management/Obesity;Improve shortness of breath with ADL's;Increase Strength and Stamina Weight Management/Obesity;Improve shortness of breath with ADL's;Increase Strength and Stamina     Review Has not lost any weight, is tolerating exercise well, beginning to increase workloads. has been absent for 2 weeks due to pneumonia, hopefully will be present to exercise this week.  Progress has been slow due to illness. No weight loss. back after pneumonia, level 6 on nustep, 12 laps on track, .5 on airdyne     Expected Outcomes  - expect improvement when recovered from pneumonia. expect  to progress with increased exercise        Core Components/Risk Factors/Patient Goals at Discharge (Final Review):      Goals and Risk Factor Review - 07/11/16 0903      Core Components/Risk Factors/Patient Goals Review   Personal Goals Review Weight Management/Obesity;Improve shortness of breath with ADL's;Increase Strength and Stamina   Review back after pneumonia, level 6 on nustep, 12 laps on track, .5 on airdyne   Expected Outcomes expect to progress with increased exercise      ITP Comments:   Comments: ITP REVIEW Pt is making expected progress toward pulmonary rehab goals after completing 14 sessions. Recommend continued exercise, life style modification, education, and utilization of breathing techniques to increase stamina and strength and decrease shortness of breath with exertion.

## 2016-07-18 ENCOUNTER — Encounter (HOSPITAL_COMMUNITY): Payer: 59

## 2016-07-20 ENCOUNTER — Encounter (HOSPITAL_COMMUNITY): Payer: 59

## 2016-07-22 NOTE — Progress Notes (Signed)
Reviewed & agree with plan  

## 2016-07-25 ENCOUNTER — Encounter (HOSPITAL_COMMUNITY)
Admission: RE | Admit: 2016-07-25 | Discharge: 2016-07-25 | Disposition: A | Discharge status: Home / Self Care | Payer: 59 | Source: Ambulatory Visit | Attending: Pulmonary Disease | Admitting: Pulmonary Disease

## 2016-07-25 VITALS — Wt 233.0 lb

## 2016-07-25 DIAGNOSIS — Z87891 Personal history of nicotine dependence: Secondary | ICD-10-CM | POA: Diagnosis not present

## 2016-07-25 DIAGNOSIS — J449 Chronic obstructive pulmonary disease, unspecified: Secondary | ICD-10-CM | POA: Insufficient documentation

## 2016-07-25 DIAGNOSIS — I509 Heart failure, unspecified: Secondary | ICD-10-CM | POA: Diagnosis not present

## 2016-07-25 DIAGNOSIS — Z7951 Long term (current) use of inhaled steroids: Secondary | ICD-10-CM | POA: Diagnosis not present

## 2016-07-25 DIAGNOSIS — Z79899 Other long term (current) drug therapy: Secondary | ICD-10-CM | POA: Diagnosis not present

## 2016-07-25 DIAGNOSIS — E669 Obesity, unspecified: Secondary | ICD-10-CM | POA: Diagnosis not present

## 2016-07-25 DIAGNOSIS — J984 Other disorders of lung: Secondary | ICD-10-CM

## 2016-07-25 DIAGNOSIS — Z6841 Body Mass Index (BMI) 40.0 and over, adult: Secondary | ICD-10-CM | POA: Diagnosis not present

## 2016-07-25 DIAGNOSIS — I110 Hypertensive heart disease with heart failure: Secondary | ICD-10-CM | POA: Insufficient documentation

## 2016-07-25 DIAGNOSIS — Z7982 Long term (current) use of aspirin: Secondary | ICD-10-CM | POA: Insufficient documentation

## 2016-07-25 NOTE — Progress Notes (Signed)
Daily Session Note  Patient Details  Name: Deborah [name] MRN: [id number] Date of Birth: April 22, 1956 Referring Provider:   April Manson [facility] Rehab Walk Test from 04/20/2016 in [place]  Referring Provider  Dr. Elsworth Soho      Encounter Date: 07/25/2016  Check In:     Session Check In - 07/25/16 1330      Check-In   Location [facility]   Staff Present Rosebud Poles, RN, BSN;Molly diVincenzo, MS, ACSM RCEP, Exercise Physiologist;Lisa Ysidro Evert, RN;Cleburne Savini Rollene Rotunda, RN, BSN   Supervising physician immediately available to respond to emergencies [facility] immediately available   Physician(s) Dr. Tana Coast   Medication changes reported     No   Fall or balance concerns reported    No   Warm-up and Cool-down Performed as group-led instruction   Resistance Training Performed Yes   VAD Patient? No     Pain Assessment   Currently in Pain? No/denies   Multiple Pain Sites No      Capillary Blood Glucose: No results found for this or any previous visit (from the past 24 hour(s)).      Exercise Prescription Changes - 07/25/16 1555      Response to Exercise   Blood Pressure (Admit) 100/60   Blood Pressure (Exercise) 144/70   Blood Pressure (Exit) 106/70   Heart Rate (Admit) 114 bpm   Heart Rate (Exercise) 128 bpm   Heart Rate (Exit) 104 bpm   Oxygen Saturation (Admit) 94 %   Oxygen Saturation (Exercise) 94 %   Oxygen Saturation (Exit) 96 %   Rating of Perceived Exertion (Exercise) 13   Perceived Dyspnea (Exercise) 2   Duration Progress to 45 minutes of aerobic exercise without signs/symptoms of physical distress   Intensity THRR unchanged     Progression   Progression Continue to progress workloads to maintain intensity without signs/symptoms of physical distress.     Resistance Training   Training Prescription Yes   Weight green bands   Reps 10-12  10 minutes of strength training     Interval Training   Interval  Training No     Oxygen   Oxygen Continuous   Liters 4     Bike   Level 0.5   Minutes 17     NuStep   Level 6   Minutes 17   METs 2.7     Track   Laps 8   Minutes 17     Goals Met:  Independence with exercise equipment Improved SOB with ADL's Using PLB without cueing & demonstrates good technique Exercise tolerated well No report of cardiac concerns or symptoms Strength training completed today  Goals Unmet:  Not Applicable  Comments: Service time is from 1330 to 1500   Dr. Rush Farmer is Medical Director for Pulmonary Rehab at [facility].

## 2016-07-27 ENCOUNTER — Encounter (HOSPITAL_COMMUNITY)
Admission: RE | Admit: 2016-07-27 | Discharge: 2016-07-27 | Disposition: A | Discharge status: Home / Self Care | Payer: 59 | Source: Ambulatory Visit | Attending: Pulmonary Disease | Admitting: Pulmonary Disease

## 2016-07-27 VITALS — Wt 229.9 lb

## 2016-07-27 DIAGNOSIS — J984 Other disorders of lung: Secondary | ICD-10-CM

## 2016-07-27 NOTE — Progress Notes (Signed)
Daily Session Note  Patient Details  Name: Deborah [name] MRN: [id number] Date of Birth: 06/08/1956 Referring Provider:   April Manson [facility]monary Rehab Walk Test from 04/20/2016 in [place]  Referring Provider  Dr. Elsworth Soho      Encounter Date: 07/27/2016  Check In:     Session Check In - 07/27/16 1340      Check-In   Location [facility]   Staff Present Su Hilt, MS, ACSM RCEP, Exercise Physiologist;Joan Leonia Reeves, RN, BSN;Lisa Hughes, RN;Portia Rollene Rotunda, RN, BSN   Supervising physician immediately available to respond to emergencies [facility] immediately available   Physician(s) Dr. Eliseo Squires   Medication changes reported     No   Fall or balance concerns reported    No   Warm-up and Cool-down Performed as group-led instruction   Resistance Training Performed Yes   VAD Patient? No     Pain Assessment   Currently in Pain? No/denies   Multiple Pain Sites No      Capillary Blood Glucose: No results found for this or any previous visit (from the past 24 hour(s)).      Exercise Prescription Changes - 07/27/16 1600      Response to Exercise   Blood Pressure (Admit) 110/66   Blood Pressure (Exercise) 108/70   Blood Pressure (Exit) 120/82   Heart Rate (Admit) 102 bpm   Heart Rate (Exercise) 126 bpm   Heart Rate (Exit) 112 bpm   Oxygen Saturation (Admit) 92 %   Oxygen Saturation (Exercise) 94 %   Oxygen Saturation (Exit) 97 %   Rating of Perceived Exertion (Exercise) 13   Perceived Dyspnea (Exercise) 2   Duration Progress to 45 minutes of aerobic exercise without signs/symptoms of physical distress   Intensity THRR unchanged     Progression   Progression Continue to progress workloads to maintain intensity without signs/symptoms of physical distress.     Resistance Training   Training Prescription Yes   Weight green bands   Reps 10-12  10 minutes of strength training     Interval Training   Interval  Training No     Oxygen   Oxygen Continuous   Liters 4     NuStep   Level 6   Minutes 17   METs 2.7     Track   Laps 13   Minutes 17     Goals Met:  Exercise tolerated well No report of cardiac concerns or symptoms Strength training completed today  Goals Unmet:  Not Applicable  Comments: Service time is from 1:30p to 3:40p   Dr. Rush Farmer is Medical Director for Pulmonary Rehab at [facility].

## 2016-08-01 ENCOUNTER — Encounter (HOSPITAL_COMMUNITY): Payer: 59

## 2016-08-03 ENCOUNTER — Encounter (HOSPITAL_COMMUNITY): Admission: RE | Admit: 2016-08-03 | Payer: 59 | Source: Ambulatory Visit

## 2016-08-08 ENCOUNTER — Encounter (HOSPITAL_COMMUNITY)
Admission: RE | Admit: 2016-08-08 | Discharge: 2016-08-08 | Disposition: A | Discharge status: Home / Self Care | Payer: 59 | Source: Ambulatory Visit | Attending: Pulmonary Disease | Admitting: Pulmonary Disease

## 2016-08-08 DIAGNOSIS — J984 Other disorders of lung: Secondary | ICD-10-CM

## 2016-08-08 NOTE — Progress Notes (Signed)
Pulmonary Individual Treatment Plan  Patient Details  Name: Deborah [name] MRN: [id number] Date of Birth: Nov 23, 1955 Referring Provider:   April Manson Pulmonary Rehab Walk Test from 04/20/2016 in [place]  Referring Provider  Dr. Elsworth Soho      Initial Encounter Date:  Flowsheet Row Pulmonary Rehab Walk Test from 04/20/2016 in [place]  Date  04/20/16  Referring Provider  Dr. Elsworth Soho      Visit Diagnosis: Restrictive lung disease  Patient's Home Medications on Admission:   Current Outpatient Prescriptions:  .  albuterol (PROVENTIL HFA;VENTOLIN HFA) 108 (90 Base) MCG/ACT inhaler, Inhale 2 puffs into the lungs every 6 (six) hours as needed for wheezing or shortness of breath., Disp: 1 Inhaler, Rfl: 2 .  albuterol (PROVENTIL) (2.5 MG/3ML) 0.083% nebulizer solution, USE 1 VIAL VIA NEBULIZER EVERY 3 HOURS AS NEEDED FOR WHEEZING OR SHORTNESS OF BREATH, Disp: 1125 mL, Rfl: 1 .  aspirin 81 MG chewable tablet, Chew 1 tablet (81 mg total) by mouth daily., Disp: 30 tablet, Rfl: 1 .  CARTIA XT 120 MG 24 hr capsule, TAKE ONE CAPSULE BY MOUTH DAILY, Disp: 30 capsule, Rfl: 3 .  fexofenadine (ALLEGRA) 180 MG tablet, Take 180 mg by mouth every other day., Disp: , Rfl:  .  fluticasone furoate-vilanterol (BREO ELLIPTA) 100-25 MCG/INH AEPB, Inhale 1 puff into the lungs daily., Disp: 1 each, Rfl: 5 .  furosemide (LASIX) 20 MG tablet, TAKE 2 TABLETS BY MOUTH EVERY MORNING AND 1 TABLET BY MOUTH EVERY EVENING, Disp: 45 tablet, Rfl: 5 .  loteprednol (LOTEMAX) 0.5 % ophthalmic suspension, Place 1 drop into the left eye 3 (three) times daily., Disp: , Rfl:  .  montelukast (SINGULAIR) 10 MG tablet, TAKE 1 TABLET BY MOUTH EVERY NIGHT AT BEDTIME, Disp: 90 tablet, Rfl: 1 .  potassium chloride SA (K-DUR,KLOR-CON) 20 MEQ tablet, TAKE 1/2 TABLET(10 MEQ) BY MOUTH DAILY, Disp: 45 tablet, Rfl: 3 .  vitamin B-12 (CYANOCOBALAMIN) 1000 MCG tablet, Take 1,000 mcg  by mouth daily., Disp: , Rfl:   Past Medical History: Past Medical History:  Diagnosis Date  . Anemia    as teen  . Asthma   . CHF (congestive heart failure) ([place])   . COPD, mild ([place])   . Depression   . Diverticulitis   . Family history of anesthesia complication    vomiting  . GI bleeding   . Heart failure ([place])    New onset 07/25/14  . Histoplasmosis    left eye  . Hyperkalemia   . Hypertension   . Obesity (BMI 30-39.9)   . Pneumonia    dx wtih pneumonia on 05/27/16- seen by [facility]   . PONV (postoperative nausea and vomiting)   . Restrictive lung disease   . Shortness of breath    with exertion   . Sleep apnea    mask and oxygen at nite for sleep at 2L   . Tobacco abuse   . Umbilical hernia     Tobacco Use: History  Smoking Status  . Former Smoker  . Packs/day: 3.00  . Years: 43.00  . Types: Cigarettes  . Quit date: 12/17/2012  Smokeless Tobacco  . Never Used    Labs: Recent Review Flowsheet Data    Labs for ITP Cardiac and Pulmonary Rehab Latest Ref Rng & Units 03/29/2015 12/18/2015 12/18/2015 12/18/2015 12/18/2015   Cholestrol 0 - 200 mg/dL 200 - - - -   LDLCALC 0 - 99 mg/dL 136(H) - - - -  HDL >39.00 mg/dL 44.60 - - - -   Trlycerides 0.0 - 149.0 mg/dL 98.0 - - - -   Hemoglobin A1c <5.7 % - - - - -   PHART 7.350 - 7.450 - - 7.289(L) 7.253(L) 7.279(L)   PCO2ART 35.0 - 45.0 mmHg - - 74.8(HH) 85.4(HH) 77.8(HH)   HCO3 20.0 - 24.0 mEq/L - 35.6(H) 34.7(H) 36.4(H) 35.3(H)   TCO2 0 - 100 mmol/L - 31.6 31.2 33.2 32.0   O2SAT % - 87.5 90.5 92.7 93.0      Capillary Blood Glucose: Lab Results  Component Value Date   GLUCAP 117 (H) 03/22/2013   GLUCAP 108 (H) 03/22/2013   GLUCAP 112 (H) 03/21/2013   GLUCAP 117 (H) 03/21/2013   GLUCAP 108 (H) 03/21/2013     ADL UCSD:   Pulmonary Function Assessment:     Pulmonary Function Assessment - 04/17/16 1316      Breath   Bilateral Breath Sounds Clear   Shortness of Breath Yes      Exercise Target Goals:     Exercise Program Goal: Individual exercise prescription set with THRR, safety & activity barriers. Participant demonstrates ability to understand and report RPE using BORG scale, to self-measure pulse accurately, and to acknowledge the importance of the exercise prescription.  Exercise Prescription Goal: Starting with aerobic activity 30 plus minutes a day, 3 days per week for initial exercise prescription. Provide home exercise prescription and guidelines that participant acknowledges understanding prior to discharge.  Activity Barriers & Risk Stratification:     Activity Barriers & Cardiac Risk Stratification - 04/17/16 1313      Activity Barriers & Cardiac Risk Stratification   Activity Barriers Arthritis;Back Problems      6 Minute Walk:     6 Minute Walk    Row Name 04/20/16 1638         6 Minute Walk   Phase Initial     Distance 1355 feet     Walk Time 6 minutes     # of Rest Breaks 0     MPH 2.56     METS 2.91     RPE 15     Perceived Dyspnea  3     Symptoms No     Resting HR 107 bpm     Resting BP 94/64     Max Ex. HR 154 bpm     Max Ex. BP 124/76       Interval HR   Baseline HR 107     1 Minute HR 117     2 Minute HR 125     3 Minute HR 131     4 Minute HR 132     5 Minute HR 135     6 Minute HR 154     2 Minute Post HR 146     Interval Heart Rate? Yes       Interval Oxygen   Interval Oxygen? Yes     Baseline Oxygen Saturation % 97 %     Baseline Liters of Oxygen 4 L     1 Minute Oxygen Saturation % 96 %     1 Minute Liters of Oxygen 4 L     2 Minute Oxygen Saturation % 93 %     2 Minute Liters of Oxygen 4 L     3 Minute Oxygen Saturation % 92 %     3 Minute Liters of Oxygen 4 L     4 Minute Oxygen Saturation %  93 %     4 Minute Liters of Oxygen 4 L     5 Minute Oxygen Saturation % 93 %     5 Minute Liters of Oxygen 4 L     6 Minute Oxygen Saturation % 92 %     6 Minute Liters of Oxygen 4 L     2 Minute Post Oxygen Saturation % 96 %      2 Minute Post Liters of Oxygen 4 L        Initial Exercise Prescription:     Initial Exercise Prescription - 04/20/16 1600      Date of Initial Exercise RX and Referring Provider   Date 04/20/16   Referring Provider Dr. Elsworth Soho     Oxygen   Oxygen Continuous   Liters 4     Bike   Level 0.5   Minutes 17     NuStep   Level 2   Minutes 17   METs 1.6     Track   Laps 5   Minutes 17     Prescription Details   Frequency (times per week) 2   Duration Progress to 45 minutes of aerobic exercise without signs/symptoms of physical distress     Intensity   THRR 40-80% of Max Heartrate 65-131   Ratings of Perceived Exertion 11-13   Perceived Dyspnea 0-4     Progression   Progression Continue progressive overload as per policy without signs/symptoms or physical distress.     Resistance Training   Training Prescription Yes   Weight green bands   Reps 10-12      Perform Capillary Blood Glucose checks as needed.  Exercise Prescription Changes:     Exercise Prescription Changes    Row Name 05/02/16 1500 05/04/16 1600 05/09/16 1500 05/11/16 1600 05/16/16 1600     Exercise Review   Progression  - Yes Yes  -  -     Response to Exercise   Blood Pressure (Admit) 102/70 104/66 102/60 120/70 114/64   Blood Pressure (Exercise) 130/70 132/70 100/70 124/80 130/80   Blood Pressure (Exit) 98/78 110/62 100/60 100/66 106/60   Heart Rate (Admit) 89 bpm 89 bpm 86 bpm 116 bpm  pt forgot to take her medication today, will take. 112 bpm   Heart Rate (Exercise) 141 bpm 137 bpm 114 bpm 140 bpm 121 bpm   Heart Rate (Exit) 108 bpm 107 bpm 93 bpm 112 bpm 113 bpm   Oxygen Saturation (Admit) 96 % 92 % 96 % 95 % 96 %   Oxygen Saturation (Exercise) 94 % 95 % 94 % 92 % 95 %   Oxygen Saturation (Exit) 95 % 97 % 93 % 96 % 94 %   Rating of Perceived Exertion (Exercise) _0 Perceived Dyspnea (Exercise) _1 Duration Progress to 45 minutes of aerobic exercise without  signs/symptoms of physical distress Progress to 45 minutes of aerobic exercise without signs/symptoms of physical distress Progress to 45 minutes of aerobic exercise without signs/symptoms of physical distress Progress to 45 minutes of aerobic exercise without signs/symptoms of physical distress Progress to 45 minutes of aerobic exercise without signs/symptoms of physical distress   Intensity -  40-80% of HRR THRR unchanged THRR unchanged THRR unchanged THRR unchanged     Progression   Progression Continue to progress workloads to maintain intensity without signs/symptoms of physical distress. Continue to progress workloads to maintain intensity without signs/symptoms of physical distress. Continue  to progress workloads to maintain intensity without signs/symptoms of physical distress. Continue to progress workloads to maintain intensity without signs/symptoms of physical distress. Continue to progress workloads to maintain intensity without signs/symptoms of physical distress.     Resistance Training   Training Prescription _0    Weight _1    Reps 10-12  10 minutes of strength training 10-12  10 minutes of strength training 10-12  10 minutes of strength training 10-12  10 minutes of strength training 10-12  10 minutes of strength training     Interval Training   Interval Training _2      Oxygen   Oxygen _3    Liters _4 Bike   Level 0.5  - 0.5 0.5 0.5   Minutes 17  - _5 NuStep   Level _6 - 4   Minutes _7 - 17   METs 1.5 1.6 1.7  - 2     Track   Laps _8 -   Minutes _9 -   Row Name 05/18/16 1624 05/23/16 1600 06/15/16 1600 06/20/16 1500 06/20/16 1554     Exercise Review   Progression  - Yes  -  -  -     Response to Exercise   Blood Pressure (Admit) 102/70 122/76 114/84  - 112/68   Blood Pressure  (Exercise) 118/77 106/70 100/62  - 110/84   Blood Pressure (Exit) 102/72 100/64 100/60  - 102/68   Heart Rate (Admit) 82 bpm 111 bpm 100 bpm  - 117 bpm   Heart Rate (Exercise) 117 bpm 151 bpm 121 bpm  - 146 bpm   Heart Rate (Exit) 97 bpm 78 bpm 99 bpm  - 112 bpm   Oxygen Saturation (Admit) 94 % 95 % 92 %  - 90 %   Oxygen Saturation (Exercise) 95 % 94 % 95 %  - 95 %   Oxygen Saturation (Exit) 93 % 97 % 91 %  - 96 %   Rating of Perceived Exertion (Exercise) _10 - 13   Perceived Dyspnea (Exercise) 0 2 1  - 3   Duration Progress to 45 minutes of aerobic exercise without signs/symptoms of physical distress Progress to 45 minutes of aerobic exercise without signs/symptoms of physical distress Progress to 45 minutes of aerobic exercise without signs/symptoms of physical distress  - Progress to 45 minutes of aerobic exercise without signs/symptoms of physical distress   Intensity THRR unchanged THRR unchanged THRR unchanged  - THRR unchanged     Progression   Progression Continue to progress workloads to maintain intensity without signs/symptoms of physical distress. Continue to progress workloads to maintain intensity without signs/symptoms of physical distress. Continue to progress workloads to maintain intensity without signs/symptoms of physical distress.  - Continue to progress workloads to maintain intensity without signs/symptoms of physical distress.     Resistance Training   Training Prescription Yes Yes Yes  - Yes   Weight green bands green bands green bands  - green bands   Reps 10-12  10 minutes of strength training 10-12  10 minutes of strength training 10-12  10 minutes of strength training  - 10-12  10 minutes of strength training     Interval Training   Interval Training No No  No  - No     Oxygen   Oxygen Continuous Continuous Continuous  - Continuous   Liters _0 - 4     Bike   Level 0.5 0.5 0.5  - 0.5   Minutes _1 - 17     NuStep   Level _2 - 5    Minutes _3 - 17   METs 2.1 1.5 2.6  - 2.3     Track   Laps  - 11  -  - 7   Minutes  - 17  -  - 17     Home Exercise Plan   Plans to continue exercise at  -  -  - Home  -   Frequency  -  -  - Add 3 additional days to program exercise sessions.  -   Row Name 06/27/16 1500 06/29/16 1538 07/04/16 1600 07/06/16 1554 07/11/16 1500     Exercise Review   Progression  -  - Yes  -  -     Response to Exercise   Blood Pressure (Admit) 98/68 100/64 102/70 124/70 106/62   Blood Pressure (Exercise) 104/64 124/80 102/64 120/80 116/82   Blood Pressure (Exit) 108/70 112/60 100/68 118/74 110/60   Heart Rate (Admit) 107 bpm 103 bpm 98 bpm 89 bpm 94 bpm   Heart Rate (Exercise) 107 bpm 138 bpm 137 bpm 139 bpm 118 bpm   Heart Rate (Exit) 92 bpm 114 bpm 105 bpm 111 bpm  102 at discharge 95 bpm   Oxygen Saturation (Admit) 96 % 95 % 98 % 96 % 94 %   Oxygen Saturation (Exercise) 97 % 95 % 94 % 91 % 94 %   Oxygen Saturation (Exit) 95 % 97 % 98 % 97 % 95 %   Rating of Perceived Exertion (Exercise) _4 Perceived Dyspnea (Exercise) _5 Duration Progress to 45 minutes of aerobic exercise without signs/symptoms of physical distress Progress to 45 minutes of aerobic exercise without signs/symptoms of physical distress Progress to 45 minutes of aerobic exercise without signs/symptoms of physical distress Progress to 45 minutes of aerobic exercise without signs/symptoms of physical distress Progress to 45 minutes of aerobic exercise without signs/symptoms of physical distress   Intensity _6      Progression   Progression Continue to progress workloads to maintain intensity without signs/symptoms of physical distress. Continue to progress workloads to maintain intensity without signs/symptoms of physical distress. Continue to progress workloads to maintain intensity without signs/symptoms of physical distress. Continue to  progress workloads to maintain intensity without signs/symptoms of physical distress. Continue to progress workloads to maintain intensity without signs/symptoms of physical distress.     Resistance Training   Training Prescription _7    Weight _8    Reps 10-12  10 minutes of strength training 10-12  10 minutes of strength training 10-12  10 minutes of strength training 10-12  10 minutes of strength training 10-12  10 minutes of strength training     Interval Training   Interval Training _9      Oxygen   Oxygen _10    Liters _11 Bike   Level 0.5 0.5 0.5  - 0.5  Minutes _0 - 17     NuStep   Level 5  - _1 Minutes 17  - _2 METs 2.2  - 2.5 2.5 2.6     Track   Laps  - _3 Minutes  - _4 Row Name 07/25/16 1555 07/27/16 1600           Response to Exercise   Blood Pressure (Admit) 100/60 110/66      Blood Pressure (Exercise) 144/70 108/70      Blood Pressure (Exit) 106/70 120/82      Heart Rate (Admit) 114 bpm 102 bpm      Heart Rate (Exercise) 128 bpm 126 bpm      Heart Rate (Exit) 104 bpm 112 bpm      Oxygen Saturation (Admit) 94 % 92 %      Oxygen Saturation (Exercise) 94 % 94 %      Oxygen Saturation (Exit) 96 % 97 %      Rating of Perceived Exertion (Exercise) 13 13      Perceived Dyspnea (Exercise) 2 2      Duration Progress to 45 minutes of aerobic exercise without signs/symptoms of physical distress Progress to 45 minutes of aerobic exercise without signs/symptoms of physical distress      Intensity THRR unchanged THRR unchanged        Progression   Progression Continue to progress workloads to maintain intensity without signs/symptoms of physical distress. Continue to progress workloads to maintain intensity without signs/symptoms of physical distress.        Resistance Training    Training Prescription Yes Yes      Weight green bands green bands      Reps 10-12  10 minutes of strength training 10-12  10 minutes of strength training        Interval Training   Interval Training No No        Oxygen   Oxygen Continuous Continuous      Liters 4 4        Bike   Level 0.5  -      Minutes 17  -        NuStep   Level 6 6      Minutes 17 17      METs 2.7 2.7        Track   Laps 8 13      Minutes 17 17         Exercise Comments:     Exercise Comments    Row Name 05/22/16 1030 06/19/16 1636 06/20/16 1558 07/06/16 1001 08/08/16 0812   Exercise Comments Patient is very open to increasing workloads. Will cont. to monitor and progress.  Patient has had a string of absences due to illness, Just returned on 11/30. Will cont. to monitor and progress. Home exercise program completed Patient is progressing very well in program. Very open to making changes. Will cont. to monitor and progress.   Patient's attendance is low. Very motivated and works hard when she is here. Will discuss attendance with patient. Will cont. to monitor and progress.       Discharge Exercise Prescription (Final Exercise Prescription Changes):     Exercise Prescription Changes - 07/27/16 1600      Response to Exercise   Blood Pressure (Admit) 110/66   Blood Pressure (Exercise) 108/70   Blood Pressure (Exit) 120/82  Heart Rate (Admit) 102 bpm   Heart Rate (Exercise) 126 bpm   Heart Rate (Exit) 112 bpm   Oxygen Saturation (Admit) 92 %   Oxygen Saturation (Exercise) 94 %   Oxygen Saturation (Exit) 97 %   Rating of Perceived Exertion (Exercise) 13   Perceived Dyspnea (Exercise) 2   Duration Progress to 45 minutes of aerobic exercise without signs/symptoms of physical distress   Intensity THRR unchanged     Progression   Progression Continue to progress workloads to maintain intensity without signs/symptoms of physical distress.     Resistance Training   Training Prescription Yes    Weight green bands   Reps 10-12  10 minutes of strength training     Interval Training   Interval Training No     Oxygen   Oxygen Continuous   Liters 4     NuStep   Level 6   Minutes 17   METs 2.7     Track   Laps 13   Minutes 17       Nutrition:  Target Goals: Understanding of nutrition guidelines, daily intake of sodium <1579m, cholesterol <2026m calories 30% from fat and 7% or less from saturated fats, daily to have 5 or more servings of fruits and vegetables.  Biometrics:     Pre Biometrics - 04/17/16 1314      Pre Biometrics   Grip Strength 29 kg       Nutrition Therapy Plan and Nutrition Goals:     Nutrition Therapy & Goals - 05/09/16 1039      Nutrition Therapy   Diet Low Sodium     Personal Nutrition Goals   Personal Goal #1 1-2 lb wt loss/week to a wt loss goal of 6-24 lb [place] individualized specific dietary modifications aiming towards targeted core components such as weight, hypertension, lipid management, diabetes, heart failure and other comorbidities.   Expected Outcomes Short Term Goal: Understand basic principles of dietary content, such as calories, fat, sodium, cholesterol and nutrients.;Long Term Goal: Adherence to prescribed nutrition plan.      Nutrition Discharge: Rate Your Plate Scores:     Nutrition Assessments - 05/16/16 1457      Rate Your Plate Scores   Pre Score 44      Psychosocial: Target Goals: Acknowledge presence or absence of depression, maximize coping skills, provide positive support system. Participant is able to verbalize types and ability to use techniques and skills needed for reducing stress and depression.  Initial Review & Psychosocial Screening:     Initial Psych Review & Screening - 04/17/16 1321      Initial Review   Current issues with Current Depression;History of Depression     Family  Dynamics   Good Support System? Yes     Barriers   Psychosocial barriers to participate in program Psychosocial barriers identified (see note)  will contact Dr. ArDeborra MedinaCP for depression issues     Screening Interventions   Interventions Encouraged to exercise      Quality of Life Scores:   PHQ-9: Recent Review Flowsheet Data    [facility]   Decreased Interest 3   Down, Depressed, Hopeless 3   PHQ - 2 Score 6   Altered sleeping 3   Tired, decreased energy 3   Change in appetite 2   Feeling bad or failure about yourself  3  Trouble concentrating 0   Moving slowly or fidgety/restless 0   Suicidal thoughts 1   PHQ-9 Score 18   Difficult doing work/chores Very difficult      Psychosocial Evaluation and Intervention:     Psychosocial Evaluation - 04/17/16 1323      Psychosocial Evaluation & Interventions   Interventions Physician referral   Comments Will contact Dr. [name] for depression issues   Continued Psychosocial Services Needed Yes      Psychosocial Re-Evaluation:     Psychosocial Re-Evaluation    [place] Name 05/15/16 1254 06/13/16 1006 07/11/16 0905 08/07/16 1447       Psychosocial Re-Evaluation   Interventions Therapist referral;Encouraged to attend Pulmonary Rehabilitation for the exercise Therapist referral;Encouraged to attend Pulmonary Rehabilitation for the exercise Encouraged to attend Pulmonary Rehabilitation for the exercise Encouraged to attend Pulmonary Rehabilitation for the exercise    Comments Had a reaction in the past 2 2 different antidepressants, is currently seeing a counselor for suicidal thoughts. No antidepressant meds for now due to adverse reactions, is seeing a Social worker. No issues with depression at this time, still seeing counselor Depression is stable, continues seeing counselor    Continued Psychosocial Services Needed Yes Yes Yes Yes      Education: Education Goals: Education classes will be provided on a  weekly basis, covering required topics. Participant will state understanding/return demonstration of topics presented.  Learning Barriers/Preferences:     Learning Barriers/Preferences - 04/17/16 1313      Learning Barriers/Preferences   Learning Barriers None   Learning Preferences Pictoral;Verbal Instruction      Education Topics: Risk Factor Reduction:  -Group instruction that is supported by a PowerPoint presentation. Instructor discusses the definition of a risk factor, different risk factors for pulmonary disease, and how the heart and lungs work together.     Nutrition for Pulmonary Patient:  -Group instruction provided by PowerPoint slides, verbal discussion, and written materials to support subject matter. The instructor gives an explanation and review of healthy diet recommendations, which includes a discussion on weight management, recommendations for fruit and vegetable consumption, as well as protein, fluid, caffeine, fiber, sodium, sugar, and alcohol. Tips for eating when patients are short of breath are discussed. Flowsheet Row PULMONARY REHAB OTHER RESPIRATORY from 07/27/2016 in [place]  Date  07/27/16  Educator  edna  Instruction Review Code  2- meets goals/outcomes      Pursed Lip Breathing:  -Group instruction that is supported by demonstration and informational handouts. Instructor discusses the benefits of pursed lip and diaphragmatic breathing and detailed demonstration on how to preform both.     Oxygen Safety:  -Group instruction provided by PowerPoint, verbal discussion, and written material to support subject matter. There is an overview of "What is Oxygen" and "Why do we need it".  Instructor also reviews how to create a safe environment for oxygen use, the importance of using oxygen as prescribed, and the risks of noncompliance. There is a brief discussion on traveling with oxygen and resources the patient may  utilize. Flowsheet Row PULMONARY REHAB OTHER RESPIRATORY from 07/27/2016 in [place]  Date  05/18/16  Educator  rn  Instruction Review Code  2- meets goals/outcomes      Oxygen Equipment:  -Group instruction provided by [employer] Staff utilizing handouts, written materials, and equipment demonstrations.   Signs and Symptoms:  -Group instruction provided by written material and verbal discussion to support subject matter. Warning signs and symptoms  of infection, stroke, and heart attack are reviewed and when to call the physician/911 reinforced. Tips for preventing the spread of infection discussed. Flowsheet Row PULMONARY REHAB OTHER RESPIRATORY from 07/27/2016 in [place]  Date  06/22/16  Educator  rn  Instruction Review Code  2- meets goals/outcomes      Advanced Directives:  -Group instruction provided by verbal instruction and written material to support subject matter. Instructor reviews Advanced Directive laws and proper instruction for filling out document.   Pulmonary Video:  -Group video education that reviews the importance of medication and oxygen compliance, exercise, good nutrition, pulmonary hygiene, and pursed lip and diaphragmatic breathing for the pulmonary patient. Flowsheet Row PULMONARY REHAB OTHER RESPIRATORY from 07/27/2016 in [place]  Date  05/04/16  Educator  video  Instruction Review Code  2- meets goals/outcomes      Exercise for the Pulmonary Patient:  -Group instruction that is supported by a PowerPoint presentation. Instructor discusses benefits of exercise, core components of exercise, frequency, duration, and intensity of an exercise routine, importance of utilizing pulse oximetry during exercise, safety while exercising, and options of places to exercise outside of rehab.   Flowsheet Row PULMONARY REHAB OTHER RESPIRATORY from 07/27/2016 in [place]  Date  06/15/16  Educator  EP  Instruction Review Code  2- meets goals/outcomes      Pulmonary Medications:  -Verbally interactive group education provided by instructor with focus on inhaled medications and proper administration.   Anatomy and Physiology of the Respiratory System and Intimacy:  -Group instruction provided by PowerPoint, verbal discussion, and written material to support subject matter. Instructor reviews respiratory cycle and anatomical components of the respiratory system and their functions. Instructor also reviews differences in obstructive and restrictive respiratory diseases with examples of each. Intimacy, Sex, and Sexuality differences are reviewed with a discussion on how relationships can change when diagnosed with pulmonary disease. Common sexual concerns are reviewed. Flowsheet Row PULMONARY REHAB OTHER RESPIRATORY from 07/27/2016 in [place]  Date  06/22/16  Educator  rn  Instruction Review Code  2- meets goals/outcomes      Knowledge Questionnaire Score:   Core Components/Risk Factors/Patient Goals at Admission:     Personal Goals and Risk Factors at Admission - 04/17/16 1317      Core Components/Risk Factors/Patient Goals on Admission    Weight Management Yes;Obesity   Intervention Weight Management: Develop a combined nutrition and exercise program designed to reach desired caloric intake, while maintaining appropriate intake of nutrient and fiber, sodium and fats, and appropriate energy expenditure required for the weight goal.;Weight Management: Provide education and appropriate resources to help participant work on and attain dietary goals.;Weight Management/Obesity: Establish reasonable short term and long term weight goals.;Obesity: Provide education and appropriate resources to help participant work on and attain dietary goals.   Admit Weight 230 lb 6.1 oz (104.5 kg)   Goal Weight:  Short Term 225 lb (102.1 kg)   Expected Outcomes Short Term: Continue to assess and modify interventions until short term weight is achieved   Increase Strength and Stamina Yes   Intervention Provide advice, education, support and counseling about physical activity/exercise needs.;Develop an individualized exercise prescription for aerobic and resistive training based on initial evaluation findings, risk stratification, comorbidities and participant's personal goals.   Expected Outcomes Achievement of increased cardiorespiratory fitness and enhanced flexibility, muscular endurance and strength shown through measurements of functional capacity and  personal statement of participant.   Improve shortness of breath with ADL's Yes   Intervention Provide education, individualized exercise plan and daily activity instruction to help decrease symptoms of SOB with activities of daily living.   Expected Outcomes Short Term: Achieves a reduction of symptoms when performing activities of daily living.      Core Components/Risk Factors/Patient Goals Review:      Goals and Risk Factor Review    Row Name 05/15/16 1251 06/13/16 1004 07/11/16 0903 08/07/16 1445       Core Components/Risk Factors/Patient Goals Review   Personal Goals Review Weight Management/Obesity;Improve shortness of breath with ADL's;Increase Strength and Stamina Weight Management/Obesity;Improve shortness of breath with ADL's;Increase Strength and Stamina Weight Management/Obesity;Improve shortness of breath with ADL's;Increase Strength and Stamina Weight Management/Obesity;Improve shortness of breath with ADL's;Increase Strength and Stamina    Review Has not lost any weight, is tolerating exercise well, beginning to increase workloads. has been absent for 2 weeks due to pneumonia, hopefully will be present to exercise this week.  Progress has been slow due to illness. No weight loss. back after pneumonia, level 6 on nustep, 12 laps on track,  .5 on airdyne No weight loss as of yet, level 6 on nustep, 12 laps on track, slowly progressing    Expected Outcomes  - expect improvement when recovered from pneumonia. expect to progress with increased exercise continue to increase workloas to improve strength and lose weight       Core Components/Risk Factors/Patient Goals at Discharge (Final Review):      Goals and Risk Factor Review - 08/07/16 1445      Core Components/Risk Factors/Patient Goals Review   Personal Goals Review Weight Management/Obesity;Improve shortness of breath with ADL's;Increase Strength and Stamina   Review No weight loss as of yet, level 6 on nustep, 12 laps on track, slowly progressing   Expected Outcomes continue to increase workloas to improve strength and lose weight      ITP Comments:   Comments: ITP REVIEW Pt is making expected progress toward pulmonary rehab goals after completing 16 sessions. Recommend continued exercise, life style modification, education, and utilization of breathing techniques to increase stamina and strength and decrease shortness of breath with exertion.

## 2016-08-14 NOTE — Addendum Note (Signed)
Encounter addended by: Jewel Baize, RD on: 08/14/2016 12:12 PM<BR>    Actions taken: Flowsheet data copied forward, Visit Navigator Flowsheet section accepted

## 2016-08-15 ENCOUNTER — Ambulatory Visit: Payer: Self-pay | Admitting: Pulmonary Disease

## 2016-08-15 NOTE — Progress Notes (Signed)
Discharge Summary  Patient Details  Name: NILAM QUAKENBUSH MRN: [id number] Date of Birth: [birth year] Referring Provider:   April Manson Pulmonary Rehab Walk Test from 04/20/2016 in [place]  Referring Provider  Dr. Elsworth Soho       Number of Visits: 16  Reason for Discharge:  Early Exit:  insurance copayments are too expensive   Smoking History:  History  Smoking Status  . Former Smoker  . Packs/day: 3.00  . Years: 43.00  . Types: Cigarettes  . Quit date: 12/17/2012  Smokeless Tobacco  . Never Used    Diagnosis:  Restrictive lung disease  ADL UCSD:   Initial Exercise Prescription:     Initial Exercise Prescription - 04/20/16 1600      Date of Initial Exercise RX and Referring Provider   Date 04/20/16   Referring Provider Dr. Elsworth Soho     Oxygen   Oxygen Continuous   Liters 4     Bike   Level 0.5   Minutes 17     NuStep   Level 2   Minutes 17   METs 1.6     Track   Laps 5   Minutes 17     Prescription Details   Frequency (times per week) 2   Duration Progress to 45 minutes of aerobic exercise without signs/symptoms of physical distress     Intensity   THRR 40-80% of Max Heartrate 65-131   Ratings of Perceived Exertion 11-13   Perceived Dyspnea 0-4     Progression   Progression Continue progressive overload as per policy without signs/symptoms or physical distress.     Resistance Training   Training Prescription Yes   Weight green bands   Reps 10-12      Discharge Exercise Prescription (Final Exercise Prescription Changes):     Exercise Prescription Changes - 07/27/16 1600      Response to Exercise   Blood Pressure (Admit) 110/66   Blood Pressure (Exercise) 108/70   Blood Pressure (Exit) 120/82   Heart Rate (Admit) 102 bpm   Heart Rate (Exercise) 126 bpm   Heart Rate (Exit) 112 bpm   Oxygen Saturation (Admit) 92 %   Oxygen Saturation (Exercise) 94 %   Oxygen Saturation (Exit) 97 %   Rating of Perceived  Exertion (Exercise) 13   Perceived Dyspnea (Exercise) 2   Duration Progress to 45 minutes of aerobic exercise without signs/symptoms of physical distress   Intensity THRR unchanged     Progression   Progression Continue to progress workloads to maintain intensity without signs/symptoms of physical distress.     Resistance Training   Training Prescription Yes   Weight green bands   Reps 10-12  10 minutes of strength training     Interval Training   Interval Training No     Oxygen   Oxygen Continuous   Liters 4     NuStep   Level 6   Minutes 17   METs 2.7     Track   Laps 13   Minutes 17      Functional Capacity:     6 Minute Walk    Row Name 04/20/16 1638         6 Minute Walk   Phase Initial     Distance 1355 feet     Walk Time 6 minutes     # of Rest Breaks 0     MPH 2.56     METS 2.91     RPE  15     Perceived Dyspnea  3     Symptoms No     Resting HR 107 bpm     Resting BP 94/64     Max Ex. HR 154 bpm     Max Ex. BP 124/76       Interval HR   Baseline HR 107     1 Minute HR 117     2 Minute HR 125     3 Minute HR 131     4 Minute HR 132     5 Minute HR 135     6 Minute HR 154     2 Minute Post HR 146     Interval Heart Rate? Yes       Interval Oxygen   Interval Oxygen? Yes     Baseline Oxygen Saturation % 97 %     Baseline Liters of Oxygen 4 L     1 Minute Oxygen Saturation % 96 %     1 Minute Liters of Oxygen 4 L     2 Minute Oxygen Saturation % 93 %     2 Minute Liters of Oxygen 4 L     3 Minute Oxygen Saturation % 92 %     3 Minute Liters of Oxygen 4 L     4 Minute Oxygen Saturation % 93 %     4 Minute Liters of Oxygen 4 L     5 Minute Oxygen Saturation % 93 %     5 Minute Liters of Oxygen 4 L     6 Minute Oxygen Saturation % 92 %     6 Minute Liters of Oxygen 4 L     2 Minute Post Oxygen Saturation % 96 %     2 Minute Post Liters of Oxygen 4 L        Psychological, QOL, Others - Outcomes: PHQ 2/9: Depression screen PHQ 2/9  04/17/2016  Decreased Interest 3  Down, Depressed, Hopeless 3  PHQ - 2 Score 6  Altered sleeping 3  Tired, decreased energy 3  Change in appetite 2  Feeling bad or failure about yourself  3  Trouble concentrating 0  Moving slowly or fidgety/restless 0  Suicidal thoughts 1  PHQ-9 Score 18  Difficult doing work/chores Very difficult    Quality of Life:   Personal Goals: Goals established at orientation with interventions provided to work toward goal.     Personal Goals and Risk Factors at Admission - 04/17/16 1317      Core Components/Risk Factors/Patient Goals on Admission    Weight Management Yes;Obesity   Intervention Weight Management: Develop a combined nutrition and exercise program designed to reach desired caloric intake, while maintaining appropriate intake of nutrient and fiber, sodium and fats, and appropriate energy expenditure required for the weight goal.;Weight Management: Provide education and appropriate resources to help participant work on and attain dietary goals.;Weight Management/Obesity: Establish reasonable short term and long term weight goals.;Obesity: Provide education and appropriate resources to help participant work on and attain dietary goals.   Admit Weight 230 lb 6.1 oz (104.5 kg)   Goal Weight: Short Term 225 lb (102.1 kg)   Expected Outcomes Short Term: Continue to assess and modify interventions until short term weight is achieved   Increase Strength and Stamina Yes   Intervention Provide advice, education, support and counseling about physical activity/exercise needs.;Develop an individualized exercise prescription for aerobic and resistive training based on initial evaluation findings, risk stratification, comorbidities  and participant's personal goals.   Expected Outcomes Achievement of increased cardiorespiratory fitness and enhanced flexibility, muscular endurance and strength shown through measurements of functional capacity and personal  statement of participant.   Improve shortness of breath with ADL's Yes   Intervention Provide education, individualized exercise plan and daily activity instruction to help decrease symptoms of SOB with activities of daily living.   Expected Outcomes Short Term: Achieves a reduction of symptoms when performing activities of daily living.       Personal Goals Discharge:     Goals and Risk Factor Review    Row Name 05/15/16 1251 06/13/16 1004 07/11/16 0903 08/07/16 1445       Core Components/Risk Factors/Patient Goals Review   Personal Goals Review Weight Management/Obesity;Improve shortness of breath with ADL's;Increase Strength and Stamina Weight Management/Obesity;Improve shortness of breath with ADL's;Increase Strength and Stamina Weight Management/Obesity;Improve shortness of breath with ADL's;Increase Strength and Stamina Weight Management/Obesity;Improve shortness of breath with ADL's;Increase Strength and Stamina    Review Has not lost any weight, is tolerating exercise well, beginning to increase workloads. has been absent for 2 weeks due to pneumonia, hopefully will be present to exercise this week.  Progress has been slow due to illness. No weight loss. back after pneumonia, level 6 on nustep, 12 laps on track, .5 on airdyne No weight loss as of yet, level 6 on nustep, 12 laps on track, slowly progressing    Expected Outcomes  - expect improvement when recovered from pneumonia. expect to progress with increased exercise continue to increase workloas to improve strength and lose weight       Nutrition & Weight - Outcomes:     Pre Biometrics - 04/17/16 1314      Pre Biometrics   Grip Strength 29 kg       Nutrition:     Nutrition Therapy & Goals - 05/09/16 1039      Nutrition Therapy   Diet Low Sodium     Personal Nutrition Goals   Personal Goal #1 1-2 lb wt loss/week to a wt loss goal of 6-24 lb at graduation from [place], educate and counsel regarding individualized specific dietary modifications aiming towards targeted core components such as weight, hypertension, lipid management, diabetes, heart failure and other comorbidities.   Expected Outcomes Short Term Goal: Understand basic principles of dietary content, such as calories, fat, sodium, cholesterol and nutrients.;Long Term Goal: Adherence to prescribed nutrition plan.      Nutrition Discharge:     Nutrition Assessments - 05/16/16 1457      Rate Your Plate Scores   Pre Score 44      Education Questionnaire Score:   Goals reviewed with patient; copy given to patient.

## 2016-08-15 NOTE — Addendum Note (Signed)
Encounter addended by: Lance Morin, RN on: 08/15/2016 10:24 AM<BR>    Actions taken: Sign clinical note, Episode resolved

## 2016-08-16 ENCOUNTER — Other Ambulatory Visit: Payer: Self-pay | Admitting: Cardiovascular Disease

## 2016-08-21 ENCOUNTER — Ambulatory Visit: Payer: Self-pay | Admitting: Pulmonary Disease

## 2016-08-28 ENCOUNTER — Telehealth: Payer: Self-pay | Admitting: Cardiovascular Disease

## 2016-08-28 NOTE — Telephone Encounter (Signed)
Received records request from Disability [employer] , forwarded to [employer] for processing. ° °

## 2016-08-29 ENCOUNTER — Other Ambulatory Visit: Payer: Self-pay | Admitting: Family Medicine

## 2016-09-06 ENCOUNTER — Encounter: Payer: Self-pay | Admitting: Pulmonary Disease

## 2016-09-08 ENCOUNTER — Encounter: Payer: Self-pay | Admitting: Pulmonary Disease

## 2016-09-08 ENCOUNTER — Ambulatory Visit (INDEPENDENT_AMBULATORY_CARE_PROVIDER_SITE_OTHER): Payer: 59 | Admitting: Pulmonary Disease

## 2016-09-08 DIAGNOSIS — J9611 Chronic respiratory failure with hypoxia: Secondary | ICD-10-CM | POA: Diagnosis not present

## 2016-09-08 DIAGNOSIS — J984 Other disorders of lung: Secondary | ICD-10-CM | POA: Diagnosis not present

## 2016-09-08 DIAGNOSIS — G4733 Obstructive sleep apnea (adult) (pediatric): Secondary | ICD-10-CM

## 2016-09-08 NOTE — Patient Instructions (Signed)
Breathing test on next visit CPAP is working well Continue on oxygen

## 2016-09-08 NOTE — Assessment & Plan Note (Signed)
PFTs next visit Continue breo meantime albuterol nebs as needed

## 2016-09-08 NOTE — Assessment & Plan Note (Signed)
Continue 2 L of oxygen

## 2016-09-08 NOTE — Assessment & Plan Note (Signed)
CPAP is working well and supplies of be renewed for a year Continue auto CPAP settings with nasal mask with 2 L oxygen blended in  Weight loss encouraged, compliance with goal of at least 4-6 hrs every night is the expectation. Advised against medications with sedative side effects Cautioned against driving when sleepy - understanding that sleepiness will vary on a day to day basis

## 2016-09-08 NOTE — Progress Notes (Signed)
   Subjective:    Patient ID: Deborah [name], female    DOB: 02-Aug-1955, 61 y.o.   MRN: [id number]  HPI  25 yof with PMH of obesity, mild COPD, OSA adm 07/2013 with hypercarbic, hypoxic resp failure due to volume overload from previously undx CHF.  She smoked up to 3 packs per day for 43 years before quitting in 2015. She developed desat post op from umbilical hernia surgery placed on 02 at 2lpm and referred by Dr [name] 04/16/13 to pulmonary clinic ([facility])  12/2015 adm for hypercarbic resp failure,Improved with BiPAP Then again admitted for diverticulitis  09/08/2016  Chief Complaint  Patient presents with  . Follow-up    doing great on CPAP, Breo doing well, she has had  more cough since the weather changed, possible allergies   She was started on oxygen blended into CPAP She is maintained on auto 5-10 cm and reports good compliance with good improvement in her daytime somnolence and fatigue. Download confirms good compliance with good control of events and no leakage with average pressure of 10 cm. Her breathing has been stable, she continues on Briel, needed albuterol nebs more often in the last week due to weather change. Otherwise only needs it infrequently. She is compliant with oxygen  She had 2 visits in December for what was treated as a COPD exacerbation  She is compliant with Lasix    SIGNIFICANT EVENTS / STUDIES:  05/2013 PFTs- normal ratio, moderate restriction, FVC 54%, FEV1 56%, small airways with significant bronchodilator response, DLCO 60% 1/10 ECHO >>nml LV systolic fxn, dilated hypocontractile RV c/w cor pulmonale 1/12 VQ negative for PE.  1/13 ACE inhibitor changed to ARB and steriods increased yesterday and now wheezing better.   08/2014 PSG - 216 lbs -AHI 7/h, lowest desatn 82%   Review of Systems neg for any significant sore throat, dysphagia, itching, sneezing, nasal congestion or excess/ purulent secretions, fever, chills, sweats,  unintended wt loss, pleuritic or exertional cp, hempoptysis, orthopnea pnd or change in chronic leg swelling. Also denies presyncope, palpitations, heartburn, abdominal pain, nausea, vomiting, diarrhea or change in bowel or urinary habits, dysuria,hematuria, rash, arthralgias, visual complaints, headache, numbness weakness or ataxia.     Objective:   Physical Exam   Gen. Pleasant, obese, in no distress ENT - no lesions, no post nasal drip Neck: No JVD, no thyromegaly, no carotid bruits Lungs: no use of accessory muscles, no dullness to percussion, decreased without rales or rhonchi  Cardiovascular: Rhythm regular, heart sounds  normal, no murmurs or gallops, no peripheral edema Musculoskeletal: No deformities, no cyanosis or clubbing , no tremors        Assessment & Plan:

## 2016-09-08 NOTE — Addendum Note (Signed)
Addended by: Jannette Spanner on: 09/08/2016 11:07 AM   Modules accepted: Orders

## 2016-09-21 ENCOUNTER — Other Ambulatory Visit: Payer: Self-pay | Admitting: Family Medicine

## 2016-09-21 ENCOUNTER — Other Ambulatory Visit: Payer: Self-pay | Admitting: Cardiovascular Disease

## 2016-10-30 ENCOUNTER — Other Ambulatory Visit: Payer: Self-pay | Admitting: Family Medicine

## 2016-11-08 ENCOUNTER — Other Ambulatory Visit: Payer: Self-pay | Admitting: Family Medicine

## 2016-11-21 ENCOUNTER — Other Ambulatory Visit: Payer: Self-pay | Admitting: Family Medicine

## 2016-11-23 ENCOUNTER — Ambulatory Visit (INDEPENDENT_AMBULATORY_CARE_PROVIDER_SITE_OTHER): Payer: 59 | Admitting: Cardiovascular Disease

## 2016-11-23 ENCOUNTER — Encounter: Payer: Self-pay | Admitting: Cardiovascular Disease

## 2016-11-23 VITALS — BP 110/66 | HR 90 | Ht 63.0 in | Wt 233.0 lb

## 2016-11-23 DIAGNOSIS — I5032 Chronic diastolic (congestive) heart failure: Secondary | ICD-10-CM | POA: Diagnosis not present

## 2016-11-23 DIAGNOSIS — I10 Essential (primary) hypertension: Secondary | ICD-10-CM | POA: Diagnosis not present

## 2016-11-23 NOTE — Progress Notes (Signed)
Cardiology Office Note   Date:  11/23/2016   ID:  [name], Deborah Sep 01, 1955, MRN [id number]  PCP:  [name], MD  Cardiologist:   Kathlyn Sacramento, MD   Chief Complaint  Patient presents with  . other    6 month follow up. Patient c/o chest pain about three times a week and she makes herself throw up and it goes away. Meds reviewed verbally with patient.       History of Present Illness: Deborah [name] is a 61 y.o. female who presents for a follow-up visit regarding chronic diastolic heart failure and chronic right heart failure due to COPD. She has known history of tobacco use with COPD, essential Htn, Obesity and bipolar disorder.  She was hospitalized in January, 2016 for CHF.  She had moderate size left pleural effusion. Echocardiogram showed an ejection fraction of 93-81%, grade 1 diastolic dysfunction, dilated right ventricle with hypokinesis and mild pulmonary hypertension.    She quit smoking completely since then .VQ scan was low probability for pulmonary embolism.  She continues to be on home oxygen.  She has been doing reasonably well and reports stable symptoms. No chest pain. She has been stable on current dose of furosemide 40 mg in the morning and 20 mg in the afternoon.  Past Medical History:  Diagnosis Date  . Anemia    as teen  . Asthma   . CHF (congestive heart failure) ([name])   . COPD, mild ([place])   . Depression   . Diverticulitis   . Family history of anesthesia complication    vomiting  . GI bleeding   . Heart failure ([place])    New onset 07/25/14  . Histoplasmosis    left eye  . Hyperkalemia   . Hypertension   . Obesity (BMI 30-39.9)   . Pneumonia    dx wtih pneumonia on 05/27/16- seen by [facility]   . PONV (postoperative nausea and vomiting)   . Restrictive lung disease   . Shortness of breath    with exertion   . Sleep apnea    mask and oxygen at nite for sleep at 2L   . Tobacco abuse   . Umbilical hernia     Past Surgical History:    Procedure Laterality Date  . APPLICATION OF WOUND VAC  03/19/2013   Procedure: APPLICATION OF WOUND VAC;  Surgeon: Madilyn Hook, DO;  Location: [facility];  Service: General;;  . CESAREAN SECTION  04/12/85  . COLONOSCOPY WITH PROPOFOL N/A 06/13/2016   Procedure: COLONOSCOPY WITH PROPOFOL;  Surgeon: Jerene Bears, MD;  Location: [facility];  Service: Gastroenterology;  Laterality: N/A;  . INSERTION OF MESH N/A 03/19/2013   Procedure: INSERTION OF MESH;  Surgeon: Madilyn Hook, DO;  Location: [facility];  Service: General;  Laterality: N/A;  . VENTRAL HERNIA REPAIR N/A 03/19/2013   Procedure:  OPEN VENTRAL HERNIA REPAIR WITH MESH AND APPLICATION OF WOUND VAC;  Surgeon: Madilyn Hook, DO;  Location: [facility];  Service: General;  Laterality: N/A;     Current Outpatient Prescriptions  Medication Sig Dispense Refill  . albuterol (PROVENTIL HFA;VENTOLIN HFA) 108 (90 Base) MCG/ACT inhaler Inhale 2 puffs into the lungs every 6 (six) hours as needed for wheezing or shortness of breath. 1 Inhaler 2  . albuterol (PROVENTIL) (2.5 MG/3ML) 0.083% nebulizer solution USE 1 VIAL VIA NEBULIZER EVERY 3 HOURS AS NEEDED FOR WHEEZING OR SHORTNESS OF BREATH 1125 mL 1  . aspirin 81 MG chewable  tablet Chew 1 tablet (81 mg total) by mouth daily. 30 tablet 1  . CARTIA XT 120 MG 24 hr capsule TAKE ONE CAPSULE BY MOUTH DAILY 30 capsule 3  . fexofenadine (ALLEGRA) 180 MG tablet Take 180 mg by mouth every other day.    . fluticasone furoate-vilanterol (BREO ELLIPTA) 100-25 MCG/INH AEPB Inhale 1 puff into the lungs daily. 1 each 5  . furosemide (LASIX) 20 MG tablet TAKE 2 TABLETS BY MOUTH EVERY MORNING AND 1 TABLET BY MOUTH EVERY EVENING 45 tablet 1  . montelukast (SINGULAIR) 10 MG tablet TAKE 1 TABLET BY MOUTH EVERY NIGHT AT BEDTIME 90 tablet 1  . potassium chloride SA (K-DUR,KLOR-CON) 20 MEQ tablet TAKE 1/2 TABLET(10 MEQ) BY MOUTH DAILY 45 tablet 3  . vitamin B-12 (CYANOCOBALAMIN) 1000 MCG tablet Take 1,000 mcg by mouth daily.     No  current facility-administered medications for this visit.     Allergies:   Patient has no known allergies.    Social History:  The patient  reports that she quit smoking about 3 years ago. Her smoking use included Cigarettes. She has a 129.00 pack-year smoking history. She has never used smokeless tobacco. She reports that she does not drink alcohol or use drugs.   Family History:  The patient's family history includes Emphysema in her mother; Heart attack in her mother; Lymphoma in her maternal uncle; Pancreatic cancer in her maternal aunt.    ROS:  Please see the history of present illness.   Otherwise, review of systems are positive for none.   All other systems are reviewed and negative.    PHYSICAL EXAM: VS:  BP 110/66 (BP Location: Left Arm, Patient Position: Sitting, Cuff Size: Normal)   Pulse 90   Ht '5\' 3"'$  (1.6 m)   Wt 233 lb (105.7 kg)   BMI 41.27 kg/m  , BMI Body mass index is 41.27 kg/m. GEN: Well nourished, well developed, in no acute distress  HEENT: normal  Neck: no JVD, carotid bruits, or masses Cardiac: RRR; no murmurs, rubs, or gallops,no edema  Respiratory:  clear to auscultation bilaterally with diminished breath sounds, normal work of breathing GI: soft, nontender, nondistended, + BS MS: no deformity or atrophy  Skin: warm and dry, no rash Neuro:  Strength and sensation are intact Psych: euthymic mood, full affect   EKG:  EKG is ordered today. The ekg ordered today demonstrates normal sinus rhythm with low voltage and nonspecific T wave changes   Recent Labs: 12/18/2015: B Natriuretic Peptide 166.0 12/19/2015: Magnesium 2.1 01/07/2016: ALT 28 01/08/2016: BUN 20; Creatinine, Ser 0.87; Potassium 3.7; Sodium 140 01/20/2016: Hemoglobin 13.1; Platelets 270.0    Lipid Panel    Component Value Date/Time   CHOL 200 03/29/2015 1216   TRIG 98.0 03/29/2015 1216   HDL 44.60 03/29/2015 1216   CHOLHDL 4 03/29/2015 1216   VLDL 19.6 03/29/2015 1216   LDLCALC 136 (H)  03/29/2015 1216      Wt Readings from Last 3 Encounters:  11/23/16 233 lb (105.7 kg)  09/08/16 232 lb 6.4 oz (105.4 kg)  07/27/16 229 lb 15 oz (104.3 kg)        ASSESSMENT AND PLAN:  1.  Chronic diastolic heart failure: She appears to be euvolemic on current dose of furosemide.   I requested basic metabolic profile. Sinus tachycardia seems to be controlled with current dose of diltiazem.  2. Essential hypertension: Blood pressure  is controlled on diltiazem.  Disposition:   FU with me in 6  months  Signed,  Kathlyn Sacramento, MD  11/23/2016 4:18 PM    [facility]

## 2016-11-23 NOTE — Patient Instructions (Addendum)
Medication Instructions:  Your physician recommends that you continue on your current medications as directed. Please refer to the Current Medication list given to you today.  Labwork: BMET today  Testing/Procedures: none  Follow-Up: Your physician wants you to follow-up in: 6 months with Dr. Arida.  You will receive a reminder letter in the mail two months in advance. If you don't receive a letter, please call our office to schedule the follow-up appointment.   Any Other Special Instructions Will Be Listed Below (If Applicable).     If you need a refill on your cardiac medications before your next appointment, please call your pharmacy.   

## 2016-11-24 LAB — BASIC METABOLIC PANEL
BUN/Creatinine Ratio: 16 (ref 9–23)
BUN: 14 mg/dL (ref 6–24)
CO2: 26 mmol/L (ref 18–29)
Calcium: 9.4 mg/dL (ref 8.7–10.2)
Chloride: 103 mmol/L (ref 96–106)
Creatinine, Ser: 0.86 mg/dL (ref 0.57–1.00)
GFR calc Af Amer: 86 mL/min/{1.73_m2} (ref >59)
GFR calc non Af Amer: 75 mL/min/{1.73_m2} (ref >59)
Glucose: 80 mg/dL (ref 65–99)
Potassium: 3.8 mmol/L (ref 3.5–5.2)
Sodium: 143 mmol/L (ref 134–144)

## 2016-12-20 ENCOUNTER — Ambulatory Visit (INDEPENDENT_AMBULATORY_CARE_PROVIDER_SITE_OTHER): Payer: 59 | Admitting: Nurse Practitioner

## 2016-12-20 ENCOUNTER — Encounter: Payer: Self-pay | Admitting: Nurse Practitioner

## 2016-12-20 ENCOUNTER — Other Ambulatory Visit (INDEPENDENT_AMBULATORY_CARE_PROVIDER_SITE_OTHER): Payer: 59

## 2016-12-20 VITALS — BP 114/70 | HR 112 | Ht 63.0 in | Wt 235.1 lb

## 2016-12-20 DIAGNOSIS — R1013 Epigastric pain: Secondary | ICD-10-CM

## 2016-12-20 LAB — COMPREHENSIVE METABOLIC PANEL
ALT: 100 U/L — ABNORMAL HIGH (ref 0–35)
AST: 25 U/L (ref 0–37)
Albumin: 4 g/dL (ref 3.5–5.2)
Alkaline Phosphatase: 116 U/L (ref 39–117)
BUN: 12 mg/dL (ref 6–23)
CO2: 26 mEq/L (ref 19–32)
Calcium: 9.6 mg/dL (ref 8.4–10.5)
Chloride: 102 mEq/L (ref 96–112)
Creatinine, Ser: 0.82 mg/dL (ref 0.40–1.20)
GFR: 75.95 mL/min (ref >60.00)
Glucose, Bld: 117 mg/dL — ABNORMAL HIGH (ref 70–99)
Potassium: 3.8 mEq/L (ref 3.5–5.1)
Sodium: 137 mEq/L (ref 135–145)
Total Bilirubin: 0.6 mg/dL (ref 0.2–1.2)
Total Protein: 7.5 g/dL (ref 6.0–8.3)

## 2016-12-20 LAB — CBC
HCT: 42.9 % (ref 36.0–46.0)
Hemoglobin: 14 g/dL (ref 12.0–15.0)
MCHC: 32.6 g/dL (ref 30.0–36.0)
MCV: 86 fl (ref 78.0–100.0)
Platelets: 300 10*3/uL (ref 150.0–400.0)
RBC: 4.99 Mil/uL (ref 3.87–5.11)
RDW: 14.4 % (ref 11.5–15.5)
WBC: 10.4 10*3/uL (ref 4.0–10.5)

## 2016-12-20 LAB — LIPASE: Lipase: 27 U/L (ref 11.0–59.0)

## 2016-12-20 MED ORDER — OMEPRAZOLE 40 MG PO CPDR: 40.0000 mg | DELAYED_RELEASE_CAPSULE | ORAL | 3 refills | Status: DC

## 2016-12-20 NOTE — Progress Notes (Addendum)
Chief complaint: abdominal pain  HPI: Patient is a 61 year old female with HTN and chronic diastolic heart failure due to COPD. She is known to Dr. Hilarie Fredrickson and had a screening colonoscopy with removal of adenomatous colon polyps in September 2017.  Deborah [name] is here for evaluation of upper abdominal pain. Epigastric pain started a year ago. Initially episodes were infrequent but since September they have become more frequent and more intense. The pain is not related eating, it often wakes her up during the night. She is unsure if the pain radiates through to her back because she has chronic back pain anyway. The pain is described as squeezing, episodes last from 10 minutes to an hour and a half.Tums nor Pepto Bismuth help. If patient forces herself to vomit pain will subside. Her last episode of pain was on Thursday night and she had chills associated with that episode.   She has no associated nausea and she hasn't had an major weight loss.  In between episodes of epigastric pain patient feels okay.. Deborah [name] took Deborah [name] powders for 13 years up until 8 months ago. Since then she has changed to ibuprofen but takes it only 3-4 times a week. She is on a baby aspirin as well. She does not take a PPI. No black stools or blood in stool.   Past Medical History:  Diagnosis Date  . Anemia    as teen  . Asthma   . CHF (congestive heart failure) ([place])   . COPD, mild ([place])   . Depression   . Diverticulitis   . Family history of anesthesia complication    vomiting  . GI bleeding   . Heart failure ([place])    New onset 07/25/14  . Histoplasmosis    left eye  . Hyperkalemia   . Hypertension   . Obesity (BMI 30-39.9)   . Pneumonia    dx wtih pneumonia on 05/27/16- seen by [facility]   . PONV (postoperative nausea and vomiting)   . Restrictive lung disease   . Shortness of breath    with exertion   . Sleep apnea    mask and oxygen at nite for sleep at 2L   . Tobacco abuse   . Umbilical hernia       Patient's surgical history, family medical history, social history, medications and allergies were all reviewed in Epic    Physical Exam: BP 114/70 (BP Location: Left Arm, Patient Position: Sitting, Cuff Size: Large)   Pulse (!) 112   Ht 5\' 3"  (1.6 m)   Wt 235 lb 2 oz (106.7 kg)   BMI 41.65 kg/m   GENERAL: obese white female in NAD PSYCH: :Pleasant, cooperative, normal affect EENT:  conjunctiva pink, mucous membranes moist, neck supple without masses CARDIAC:  RRR, no murmur heard, no peripheral edema PULM: Normal respiratory effort, lungs CTA bilaterally, no wheezing ABDOMEN:  soft, nontender, nondistended, no obvious masses, no hepatomegaly,  normal bowel sounds SKIN:  turgor, no lesions seen Musculoskeletal:  Normal muscle tone, normal strength NEURO: Alert and oriented x 3, no focal neurologic deficits    ASSESSMENT and PLAN:  1. Pleasant 61 year old female with one-year history of epigastric pain unrelated to eating, progressive over the last few months. Patient  Has taken NSAIDs for years. DDx includes : PUD vrs gallbladder disease vrs pancreatic (less likely). Of note she has a CTscan of the abdomen and pelvis with contrast one year ago. Findings included possible diverticulitis. No pancreatic lesions. Normal-appearing  gallbladder. -Patient will take Tylenol and stop ibuprofen for the time being -She is not taking a PPI so will start omeprazole 40 mg daily -CMP, lipase, CBC -Follow-up with me in 3-4 weeks, or sooner if not improving -If not improving will consider abdominal ultrasound to evaluate her gallbladder.  -If above all negative then she may need an EGD though she is at increased risk for procedures given co-morbidities. EGD will need to be done at the hospital.   2. Chronic diastolic heart failure secondary to COPD, on home 02.  Last echo in January 2018 revealed grade I diastolic dysfunction . EF 50-55%    Tye Savoy , NP 12/20/2016, 2:15  PM   Addendum: Reviewed and agree with initial management. Pyrtle, Lajuan Lines, MD

## 2016-12-20 NOTE — Patient Instructions (Addendum)
If you are age 59 or older, your body mass index should be between 23-30. Your Body mass index is 41.65 kg/m. If this is out of the aforementioned range listed, please consider follow up with your Primary Care Provider.  If you are age 52 or younger, your body mass index should be between 19-25. Your Body mass index is 41.65 kg/m. If this is out of the aformentioned range listed, please consider follow up with your Primary Care Provider.   Your physician has requested that you go to the basement for the following lab work before leaving today: CMET Lipase CBC  We have sent the following medications to your pharmacy for you to pick up at your convenience: Omeprazole 40 mg  STOP Ibuprofen.  Follow up with Tye Savoy, NP on January 16, 2017 at 1:30 pm.  Thank you for choosing me and [facility].   Tye Savoy, NP

## 2016-12-22 ENCOUNTER — Other Ambulatory Visit: Payer: Self-pay

## 2016-12-22 DIAGNOSIS — R7989 Other specified abnormal findings of blood chemistry: Secondary | ICD-10-CM

## 2016-12-22 DIAGNOSIS — R945 Abnormal results of liver function studies: Secondary | ICD-10-CM

## 2016-12-22 DIAGNOSIS — R1084 Generalized abdominal pain: Secondary | ICD-10-CM

## 2016-12-26 ENCOUNTER — Other Ambulatory Visit: Payer: Self-pay | Admitting: Family Medicine

## 2016-12-28 ENCOUNTER — Ambulatory Visit (HOSPITAL_COMMUNITY)
Admission: RE | Admit: 2016-12-28 | Discharge: 2016-12-28 | Disposition: A | Discharge status: Home / Self Care | Payer: 59 | Source: Ambulatory Visit | Attending: Nurse Practitioner | Admitting: Nurse Practitioner

## 2016-12-28 ENCOUNTER — Other Ambulatory Visit (INDEPENDENT_AMBULATORY_CARE_PROVIDER_SITE_OTHER): Payer: 59

## 2016-12-28 DIAGNOSIS — R1084 Generalized abdominal pain: Secondary | ICD-10-CM

## 2016-12-28 DIAGNOSIS — K769 Liver disease, unspecified: Secondary | ICD-10-CM | POA: Diagnosis not present

## 2016-12-28 DIAGNOSIS — K8020 Calculus of gallbladder without cholecystitis without obstruction: Secondary | ICD-10-CM | POA: Diagnosis not present

## 2016-12-28 DIAGNOSIS — R7989 Other specified abnormal findings of blood chemistry: Secondary | ICD-10-CM

## 2016-12-28 DIAGNOSIS — N270 Small kidney, unilateral: Secondary | ICD-10-CM | POA: Insufficient documentation

## 2016-12-28 DIAGNOSIS — R945 Abnormal results of liver function studies: Secondary | ICD-10-CM

## 2016-12-28 LAB — HEPATITIS C ANTIBODY: HCV Ab: NEGATIVE

## 2016-12-28 LAB — HEPATITIS A ANTIBODY, IGM: Hep A IgM: NONREACTIVE

## 2016-12-28 LAB — HEPATITIS B SURFACE ANTIGEN: Hepatitis B Surface Ag: NEGATIVE

## 2016-12-28 LAB — HEPATITIS B SURFACE ANTIBODY,QUALITATIVE: Hep B S Ab: NEGATIVE

## 2016-12-28 LAB — FERRITIN: Ferritin: 97.3 ng/mL (ref 10.0–291.0)

## 2016-12-29 LAB — IGG: IgG (Immunoglobin G), Serum: 1215 mg/dL (ref 694–1618)

## 2016-12-29 LAB — ANTI-SMOOTH MUSCLE ANTIBODY, IGG: Smooth Muscle Ab: <20 U (ref <20)

## 2016-12-29 LAB — ANA: Anti Nuclear Antibody(ANA): NEGATIVE

## 2017-01-01 ENCOUNTER — Ambulatory Visit (INDEPENDENT_AMBULATORY_CARE_PROVIDER_SITE_OTHER): Payer: 59 | Admitting: Adult Health

## 2017-01-01 ENCOUNTER — Ambulatory Visit: Payer: Self-pay | Admitting: Pulmonary Disease

## 2017-01-01 ENCOUNTER — Encounter: Payer: Self-pay | Admitting: Adult Health

## 2017-01-01 ENCOUNTER — Ambulatory Visit (INDEPENDENT_AMBULATORY_CARE_PROVIDER_SITE_OTHER): Payer: 59 | Admitting: Pulmonary Disease

## 2017-01-01 VITALS — BP 118/78 | HR 95 | Ht 63.0 in | Wt 235.0 lb

## 2017-01-01 DIAGNOSIS — J9611 Chronic respiratory failure with hypoxia: Secondary | ICD-10-CM | POA: Diagnosis not present

## 2017-01-01 DIAGNOSIS — G4733 Obstructive sleep apnea (adult) (pediatric): Secondary | ICD-10-CM | POA: Diagnosis not present

## 2017-01-01 DIAGNOSIS — J984 Other disorders of lung: Secondary | ICD-10-CM

## 2017-01-01 DIAGNOSIS — J449 Chronic obstructive pulmonary disease, unspecified: Secondary | ICD-10-CM | POA: Diagnosis not present

## 2017-01-01 DIAGNOSIS — Z23 Encounter for immunization: Secondary | ICD-10-CM | POA: Diagnosis not present

## 2017-01-01 LAB — PULMONARY FUNCTION TEST
DL/VA % pred: 92 %
DL/VA: 4.34 ml/min/mmHg/L
DLCO cor % pred: 65 %
DLCO cor: 14.98 ml/min/mmHg
DLCO unc % pred: 64 %
DLCO unc: 14.89 ml/min/mmHg
FEF 25-75 Post: 1.4 L/sec
FEF 25-75 Pre: 0.93 L/sec
FEF2575-%Change-Post: 50 %
FEF2575-%Pred-Post: 59 %
FEF2575-%Pred-Pre: 39 %
FEV1-%Change-Post: 13 %
FEV1-%Pred-Post: 59 %
FEV1-%Pred-Pre: 52 %
FEV1-Post: 1.5 L
FEV1-Pre: 1.32 L
FEV1FVC-%Change-Post: 6 %
FEV1FVC-%Pred-Pre: 95 %
FEV6-%Change-Post: 6 %
FEV6-%Pred-Post: 60 %
FEV6-%Pred-Pre: 56 %
FEV6-Post: 1.88 L
FEV6-Pre: 1.76 L
FEV6FVC-%Change-Post: 0 %
FEV6FVC-%Pred-Post: 103 %
FEV6FVC-%Pred-Pre: 102 %
FVC-%Change-Post: 6 %
FVC-%Pred-Post: 58 %
FVC-%Pred-Pre: 54 %
FVC-Post: 1.88 L
FVC-Pre: 1.76 L
Post FEV1/FVC ratio: 80 %
Post FEV6/FVC ratio: 100 %
Pre FEV1/FVC ratio: 75 %
Pre FEV6/FVC Ratio: 100 %
RV % pred: 118 %
RV: 2.26 L
TLC % pred: 84 %
TLC: 4.16 L

## 2017-01-01 MED ORDER — AEROCHAMBER MV MISC: 0 refills | Status: DC

## 2017-01-01 NOTE — Patient Instructions (Addendum)
Continue on Oxygen 2l/m .Marland Kitchen Continue on BREO daily , rinse after use.  Continue on CPAP At bedtime  With oxgyen .  Contact DME regarding new face mask options. Call if skin irritation does not improve.  Do not drive if sleepy  Work on weight loss. Prevnar 13 Vaccine  Today .  Follow up Dr. Elsworth Soho  In 4 months and. As needed

## 2017-01-01 NOTE — Addendum Note (Signed)
Addended by: Parke Poisson E on: 01/01/2017 12:40 PM   Modules accepted: Orders

## 2017-01-01 NOTE — Assessment & Plan Note (Signed)
Continue on C Pap at bedtime with oxygen

## 2017-01-01 NOTE — Assessment & Plan Note (Signed)
COPD with suspected asthma component along with restrictive lung disease due to obesity.  Doing well on BREO .

## 2017-01-01 NOTE — Progress Notes (Signed)
@Patient  ID: Deborah [name], female    DOB: [birth year], 61 y.o.   MRN: [id number]  Chief Complaint  Patient presents with  . Follow-up    Cough     Referring provider: [name], MD  HPI: 61 year old female, former smoker, followed for mild COPD and obstructive sleep apnea/hypercarbic RF on CPAP .   TEST  SIGNIFICANT EVENTS / STUDIES:  05/2013 PFTs- normal ratio, moderate restriction, FVC 54%, FEV1 56%, small airways with significant bronchodilator response, DLCO 60% 1/10 ECHO >>nml LV systolic fxn, dilated hypocontractile RV c/w cor pulmonale 1/12 VQ negative for PE.  1/13 ACE inhibitor changed to ARB and steriods increased yesterday and now wheezing better.   08/2014 PSG - 216 lbs -AHI 7/h, lowest desatn 82%  01/01/2017 Follow up : COPD /OSA  Patient presents for a three-month follow-up. Patient has known mild COPD. She says overall that her breathing is doing okay. She's had no flare of cough or wheezing. Does get winded if she walks for long period of time. She remains on oxygen at 2 L. She feels oxygen does help. Patient had a pulmonary function test shows similar to 2014 with an FEV1 at 59%, ratio 80, FVC 58%, positive bronchodilator response, DLCO 64%. She remains on BREO. Feels it helps.   She remains on CPAP At bedtime  . She feels rested. Says she wears it every single night. She has switched from a nasal mass to a full facemask. Does think that is causing some slight skin irritation. We discussed contacting her home care company see if there is any alternative options..  Pneumovax and flu shot are up-to-date. We discussed Prevnar 13 vaccine today. She is agreeable to have this.   No Known Allergies  Immunization History  Administered Date(s) Administered  . Influenza,inj,Quad PF,36+ Mos 04/09/2013, 07/27/2014, 04/18/2016  . Pneumococcal Polysaccharide-23 07/27/2014    Past Medical History:  Diagnosis Date  . Anemia    as teen  . Asthma   . CHF  (congestive heart failure) ([place])   . COPD, mild ([place])   . Depression   . Diverticulitis   . Family history of anesthesia complication    vomiting  . GI bleeding   . Heart failure ([place])    New onset 07/25/14  . Histoplasmosis    left eye  . Hyperkalemia   . Hypertension   . Obesity (BMI 30-39.9)   . Pneumonia    dx wtih pneumonia on 05/27/16- seen by [facility]   . PONV (postoperative nausea and vomiting)   . Restrictive lung disease   . Shortness of breath    with exertion   . Sleep apnea    mask and oxygen at nite for sleep at 2L   . Tobacco abuse   . Umbilical hernia     Tobacco History: History  Smoking Status  . Former Smoker  . Packs/day: 3.00  . Years: 43.00  . Types: Cigarettes  . Quit date: 12/17/2012  Smokeless Tobacco  . Never Used   Counseling given: Not Answered   Outpatient Encounter Prescriptions as of 01/01/2017  Medication Sig  . albuterol (PROVENTIL HFA;VENTOLIN HFA) 108 (90 Base) MCG/ACT inhaler Inhale 2 puffs into the lungs every 6 (six) hours as needed for wheezing or shortness of breath.  Marland Kitchen albuterol (PROVENTIL) (2.5 MG/3ML) 0.083% nebulizer solution USE 1 VIAL VIA NEBULIZER EVERY 3 HOURS AS NEEDED FOR WHEEZING OR SHORTNESS OF BREATH  . aspirin 81 MG chewable tablet Chew 1 tablet (81 mg  total) by mouth daily.  Marland Kitchen CARTIA XT 120 MG 24 hr capsule TAKE ONE CAPSULE BY MOUTH DAILY  . fexofenadine (ALLEGRA) 180 MG tablet Take 180 mg by mouth every other day.  . fluticasone furoate-vilanterol (BREO ELLIPTA) 100-25 MCG/INH AEPB Inhale 1 puff into the lungs daily.  . furosemide (LASIX) 20 MG tablet TAKE 2 TABLETS BY MOUTH EVERY MORNING AND 1 TABLET BY MOUTH EVERY EVENING  . montelukast (SINGULAIR) 10 MG tablet TAKE 1 TABLET BY MOUTH EVERY NIGHT AT BEDTIME  . omeprazole (PRILOSEC) 40 MG capsule Take 1 capsule (40 mg total) by mouth every morning. Before breakfast  . OXYGEN Inhale 2-4 L/min into the lungs continuous.  . potassium chloride SA (K-DUR,KLOR-CON) 20  MEQ tablet TAKE 1/2 TABLET(10 MEQ) BY MOUTH DAILY  . vitamin B-12 (CYANOCOBALAMIN) 1000 MCG tablet Take 1,000 mcg by mouth daily.  Marland Kitchen Spacer/Aero-Holding Chambers (AEROCHAMBER MV) inhaler Use as instructed   No facility-administered encounter medications on file as of 01/01/2017.      Review of Systems  Constitutional:   No  weight loss, night sweats,  Fevers, chills, fatigue, or  lassitude.  HEENT:   No headaches,  Difficulty swallowing,  Tooth/dental problems, or  Sore throat,                No sneezing, itching, ear ache, nasal congestion, post nasal drip,   CV:  No chest pain,  Orthopnea, PND, swelling in lower extremities, anasarca, dizziness, palpitations, syncope.   GI  No heartburn, indigestion, abdominal pain, nausea, vomiting, diarrhea, change in bowel habits, loss of appetite, bloody stools.   Resp:    No chest wall deformity  Skin: no rash or lesions.  GU: no dysuria, change in color of urine, no urgency or frequency.  No flank pain, no hematuria   MS:  No joint pain or swelling.  No decreased range of motion.  No back pain.    Physical Exam  BP 118/78 (BP Location: Left Arm, Cuff Size: Normal)   Pulse 95   Ht 5\' 3"  (1.6 m)   Wt 235 lb (106.6 kg)   SpO2 96%   BMI 41.63 kg/m   GEN: A/Ox3; pleasant , NAD, obese female on O2 .    HEENT:  [place]/AT,  EACs-clear, TMs-wnl, NOSE-clear, THROAT-clear, no lesions, no postnasal drip or exudate noted.   NECK:  Supple w/ fair ROM; no JVD; normal carotid impulses w/o bruits; no thyromegaly or nodules palpated; no lymphadenopathy.    RESP  Clear  P & A; w/o, wheezes/ rales/ or rhonchi. no accessory muscle use, no dullness to percussion  CARD:  RRR, no m/r/g, no peripheral edema, pulses intact, no cyanosis or clubbing.  GI:   Soft & nt; nml bowel sounds; no organomegaly or masses detected.   Musco: Warm bil, no deformities or joint swelling noted.   Neuro: alert, no focal deficits noted.    Skin: Warm, no lesions or  rashes    Lab Results:  CBC    Component Value Date/Time   WBC 10.4 12/20/2016 1446   RBC 4.99 12/20/2016 1446   HGB 14.0 12/20/2016 1446   HCT 42.9 12/20/2016 1446   PLT 300.0 12/20/2016 1446   MCV 86.0 12/20/2016 1446   MCH 30.6 01/09/2016 0552   MCHC 32.6 12/20/2016 1446   RDW 14.4 12/20/2016 1446   LYMPHSABS 2.3 01/20/2016 1020   MONOABS 0.8 01/20/2016 1020   EOSABS 0.2 01/20/2016 1020   BASOSABS 0.1 01/20/2016 1020    BMET  Component Value Date/Time   NA 137 12/20/2016 1446   NA 143 11/23/2016 1504   K 3.8 12/20/2016 1446   CL 102 12/20/2016 1446   CO2 26 12/20/2016 1446   GLUCOSE 117 (H) 12/20/2016 1446   BUN 12 12/20/2016 1446   BUN 14 11/23/2016 1504   CREATININE 0.82 12/20/2016 1446   CALCIUM 9.6 12/20/2016 1446   GFRNONAA 75 11/23/2016 1504   GFRAA 86 11/23/2016 1504    BNP    Component Value Date/Time   BNP 166.0 (H) 12/18/2015 0003    ProBNP No results found for: PROBNP  Imaging: US Abdomen Complete  Result Date: 12/28/2016 CLINICAL DATA:  Abdominal pain.  Elevated liver enzymes EXAM: ABDOMEN ULTRASOUND COMPLETE COMPARISON:  CT abdomen and pelvis January 07, 2016 FINDINGS: Gallbladder: Within the gallbladder, there are echogenic foci which move and shadow consistent with cholelithiasis. Largest gallstone measures 6 mm in length. There is comet tail artifact along the anterior gallbladder wall consistent with inflammatory focus such as cholesterolosis or adenomyomatosis. There is no gallbladder wall thickening or pericholecystic fluid. No sonographic Murphy sign noted by sonographer. Common bile duct: Diameter: 10 mm proximally, dilated. More distally, the common bile duct measures 6 mm, upper normal. No mass or calculus evident in the biliary ductal system. Liver: No focal lesion identified. Liver echogenicity is diffusely increased. IVC: No abnormality visualized. Pancreas: Visualized portion unremarkable. Portions of pancreas obscured by gas. Spleen:  Size and appearance within normal limits. Scattered echogenic foci noted in the spleen. Right Kidney: Length: 10.0 cm. Echogenicity within normal limits. No mass or hydronephrosis visualized. Left Kidney: Length: 12.1 cm. Echogenicity within normal limits. No mass or hydronephrosis visualized. Abdominal aorta: No aneurysm visualized. Other findings: No demonstrable ascites. IMPRESSION: Cholelithiasis. There is also inflammatory change within the gallbladder wall, likely either cholesterolosis or adenomyomatosis. No gallbladder wall thickening or pericholecystic fluid. Common bile duct dilated proximally with tapering more distally. From an imaging standpoint, MRCP would be the optimum imaging study of choice to further assess the common bile duct if additional common bile duct imaging is felt to be warranted. Portions of pancreas obscured by gas. Visualized portions of pancreas appear normal. Diffuse increase in liver echogenicity consistent with hepatic steatosis. While no focal liver lesions are evident on this study, it must be cautioned that sensitivity of ultrasound for detection of focal liver lesions is diminished in this circumstance. Borderline size discrepancy between the kidneys. Significance of this finding is uncertain. This finding potentially could indicate renal artery stenosis on the right. In this regard, question whether patient is hypertensive. Electronically Signed   By: Lowella Grip III M.D.   On: 12/28/2016 09:11     Assessment & Plan:   Chronic respiratory failure ([place]) Continue on oxygen at 2 L. C Pap at bedtime with 2 L of oxygen.  OSA (obstructive sleep apnea) Continue on C Pap at bedtime with oxygen  COPD (chronic obstructive pulmonary disease) ([facility]) COPD with suspected asthma component along with restrictive lung disease due to obesity.  Doing well on BREO .       Rexene Edison, NP 01/01/2017

## 2017-01-01 NOTE — Progress Notes (Signed)
PFT completed today 01/01/17.

## 2017-01-01 NOTE — Assessment & Plan Note (Addendum)
Continue on oxygen at 2 L. C Pap at bedtime with 2 L of oxygen.

## 2017-01-08 NOTE — Progress Notes (Signed)
Reviewed & agree with plan  

## 2017-01-09 ENCOUNTER — Other Ambulatory Visit: Payer: Self-pay

## 2017-01-09 DIAGNOSIS — R1010 Upper abdominal pain, unspecified: Secondary | ICD-10-CM

## 2017-01-09 DIAGNOSIS — K838 Other specified diseases of biliary tract: Secondary | ICD-10-CM

## 2017-01-12 ENCOUNTER — Encounter: Payer: Self-pay | Admitting: Nurse Practitioner

## 2017-01-12 ENCOUNTER — Other Ambulatory Visit: Payer: Self-pay | Admitting: Family Medicine

## 2017-01-12 ENCOUNTER — Telehealth: Payer: Self-pay | Admitting: Nurse Practitioner

## 2017-01-12 NOTE — Telephone Encounter (Signed)
Yes okay to refill as requested but please also schedule a follow up BMET at her earlier convenience.  Thank you.

## 2017-01-12 NOTE — Telephone Encounter (Signed)
[name], she can result her appt with me for sometime after the MRI. Thanks

## 2017-01-12 NOTE — Telephone Encounter (Signed)
Do you why she was only given 45 tabs she takes 3 a day? She was here on 04/20/16  Does she need a follow up?

## 2017-01-12 NOTE — Telephone Encounter (Signed)
Please put her on the schedule for after the imagaing study. Ok per Tye Savoy, NP

## 2017-01-12 NOTE — Telephone Encounter (Signed)
Nevin Bloodgood, how do you want to handle this?

## 2017-01-12 NOTE — Telephone Encounter (Signed)
On the schedule for Tues 7-17 @ 1:30pm. Left message for pt.

## 2017-01-15 NOTE — Telephone Encounter (Signed)
Scheduled

## 2017-01-16 ENCOUNTER — Ambulatory Visit: Payer: Self-pay | Admitting: Nurse Practitioner

## 2017-01-18 ENCOUNTER — Other Ambulatory Visit: Payer: Self-pay | Admitting: Family Medicine

## 2017-01-18 ENCOUNTER — Other Ambulatory Visit (INDEPENDENT_AMBULATORY_CARE_PROVIDER_SITE_OTHER): Payer: 59

## 2017-01-18 DIAGNOSIS — I509 Heart failure, unspecified: Secondary | ICD-10-CM

## 2017-01-18 LAB — BASIC METABOLIC PANEL
BUN: 13 mg/dL (ref 6–23)
CO2: 29 mEq/L (ref 19–32)
Calcium: 9.4 mg/dL (ref 8.4–10.5)
Chloride: 100 mEq/L (ref 96–112)
Creatinine, Ser: 0.84 mg/dL (ref 0.40–1.20)
GFR: 73.85 mL/min (ref >60.00)
Glucose, Bld: 89 mg/dL (ref 70–99)
Potassium: 4 mEq/L (ref 3.5–5.1)
Sodium: 137 mEq/L (ref 135–145)

## 2017-01-22 ENCOUNTER — Other Ambulatory Visit: Payer: Self-pay | Admitting: Nurse Practitioner

## 2017-01-23 ENCOUNTER — Other Ambulatory Visit: Payer: Self-pay | Admitting: Cardiovascular Disease

## 2017-01-23 ENCOUNTER — Other Ambulatory Visit: Payer: Self-pay | Admitting: Family Medicine

## 2017-01-26 ENCOUNTER — Ambulatory Visit
Admission: RE | Admit: 2017-01-26 | Discharge: 2017-01-26 | Disposition: A | Discharge status: Home / Self Care | Payer: 59 | Source: Ambulatory Visit | Attending: Nurse Practitioner | Admitting: Nurse Practitioner

## 2017-01-26 DIAGNOSIS — R1010 Upper abdominal pain, unspecified: Secondary | ICD-10-CM

## 2017-01-26 DIAGNOSIS — K838 Other specified diseases of biliary tract: Secondary | ICD-10-CM

## 2017-01-26 MED ORDER — GADOBENATE DIMEGLUMINE 529 MG/ML IV SOLN
20.0000 mL | Freq: Once | INTRAVENOUS | Status: AC | PRN
  Administered 2017-01-26: 20 mL via INTRAVENOUS

## 2017-01-30 ENCOUNTER — Other Ambulatory Visit: Payer: Self-pay | Admitting: Family Medicine

## 2017-01-30 ENCOUNTER — Other Ambulatory Visit (INDEPENDENT_AMBULATORY_CARE_PROVIDER_SITE_OTHER): Payer: 59

## 2017-01-30 ENCOUNTER — Encounter: Payer: Self-pay | Admitting: Nurse Practitioner

## 2017-01-30 ENCOUNTER — Ambulatory Visit (INDEPENDENT_AMBULATORY_CARE_PROVIDER_SITE_OTHER): Payer: 59 | Admitting: Nurse Practitioner

## 2017-01-30 VITALS — BP 126/84 | HR 100 | Ht 62.0 in | Wt 236.0 lb

## 2017-01-30 DIAGNOSIS — K8070 Calculus of gallbladder and bile duct without cholecystitis without obstruction: Secondary | ICD-10-CM | POA: Diagnosis not present

## 2017-01-30 DIAGNOSIS — K8050 Calculus of bile duct without cholangitis or cholecystitis without obstruction: Secondary | ICD-10-CM

## 2017-01-30 LAB — CBC WITH DIFFERENTIAL/PLATELET
Basophils Absolute: 0.1 10*3/uL (ref 0.0–0.1)
Basophils Relative: 1 % (ref 0.0–3.0)
Eosinophils Absolute: 0.2 10*3/uL (ref 0.0–0.7)
Eosinophils Relative: 1.8 % (ref 0.0–5.0)
HCT: 41.4 % (ref 36.0–46.0)
Hemoglobin: 13.7 g/dL (ref 12.0–15.0)
Lymphocytes Relative: 20.7 % (ref 12.0–46.0)
Lymphs Abs: 2.4 10*3/uL (ref 0.7–4.0)
MCHC: 33.1 g/dL (ref 30.0–36.0)
MCV: 84.9 fl (ref 78.0–100.0)
Monocytes Absolute: 0.8 10*3/uL (ref 0.1–1.0)
Monocytes Relative: 6.6 % (ref 3.0–12.0)
Neutro Abs: 8.1 10*3/uL — ABNORMAL HIGH (ref 1.4–7.7)
Neutrophils Relative %: 69.9 % (ref 43.0–77.0)
Platelets: 289 10*3/uL (ref 150.0–400.0)
RBC: 4.88 Mil/uL (ref 3.87–5.11)
RDW: 14.8 % (ref 11.5–15.5)
WBC: 11.5 10*3/uL — ABNORMAL HIGH (ref 4.0–10.5)

## 2017-01-30 LAB — HEPATIC FUNCTION PANEL
ALT: 20 U/L (ref 0–35)
AST: 16 U/L (ref 0–37)
Albumin: 3.9 g/dL (ref 3.5–5.2)
Alkaline Phosphatase: 86 U/L (ref 39–117)
Bilirubin, Direct: 0.1 mg/dL (ref 0.0–0.3)
Total Bilirubin: 0.3 mg/dL (ref 0.2–1.2)
Total Protein: 7.2 g/dL (ref 6.0–8.3)

## 2017-01-30 NOTE — Progress Notes (Addendum)
HPI: Patient is a 61 year old female who I saw in early June for evaluation of worsening of chronic upper abdominal pain . Episodes had become more frequent and intense in the preceding months. Nothing seemed to help the pain except vomiting. Episodes generally lasted anywhere from 10 minutes to an hour and a half. In between episodes the patient felt totally fine. I started the patient on a daily PPI, obtained labs and abdominal ultrasound. Her AST was 100, liver studies otherwise unremarkable. Abdominal ultrasound revealed cholelithiasis as well as some inflammatory change within the gallbladder wall. No pericholecystic fluid. The bile duct was dilated proximally with tapering more distally. She was sent for an MRCP which again showed cholelithiasis without evidence for cholecystitis. There was mild biliary duct dilation with multiple calculi in the distal common bile duct all less than 1 cm. Fatty liver identified.  [name] is here for follow up. She continues to have frequent episodes of upper abdominal pain not necessarily related to eating. Sometimes she has more than one episode a day. The pain starts in RUQ then radiates across upper abdomen in a band like fashion. No improvement with PPI. Reports subjective fevers. No chest pain. No SOB.    Past Medical History:  Diagnosis Date  . Anemia    as teen  . Asthma   . CHF (congestive heart failure) ([place])   . COPD, mild ([place])   . Depression   . Diverticulitis   . Family history of anesthesia complication    vomiting  . GI bleeding   . Heart failure ([place])    New onset 07/25/14  . Histoplasmosis    left eye  . Hyperkalemia   . Hypertension   . Obesity (BMI 30-39.9)   . Pneumonia    dx wtih pneumonia on 05/27/16- seen by [facility]   . PONV (postoperative nausea and vomiting)   . Restrictive lung disease   . Shortness of breath    with exertion   . Sleep apnea    mask and oxygen at nite for sleep at 2L   . Tobacco abuse   .  Umbilical hernia     Patient's surgical history, family medical history, social history, medications and allergies were all reviewed in Epic    Physical Exam: BP 126/84 (BP Location: Left Arm, Patient Position: Sitting, Cuff Size: Normal)   Pulse 100   Ht 5\' 2"  (1.575 m) Comment: height measured without shoes  Wt 236 lb (107 kg)   BMI 43.16 kg/m   GENERAL: pleasant obese white female in in NAD PSYCH: :Pleasant, cooperative, normal affect PULM: Normal respiratory effort. On oxygen.  ABDOMEN:  soft, obese, nontender, nondistended, normal bowel sounds NEURO: Alert and oriented x 3, no focal neurologic deficits   ASSESSMENT and PLAN:   Pleasant 61 year old female with worsening of chronic, intermittent upper abdominal pain. Workup has revealed cholelithiasis and choledocholithiasis to assess urgency of ERCP.  -She looks okay. Obtaining LFTs, CBC now.  -She is going to need ERCP with stone extraction. Dr. Ardis Hughs has offered to perform the ERCP on 02/15/17. If liver studies are markedly abnormal then will need to arrange for ERCP to be done sooner.. The risks / benefits of ERCP were explained to the patient. She agrees to proceed.  -can discontinue PPI, I think we have found the cause of her pain.   2. COPD, on home 02  Addendum: Labs resulted. Her LFTs are normal. Will proceed with the ERCP on  8/2. Marland Kitchen WBC 11.5. No evidence for biliary obstruction so will hold off on empirical antibiotics for now.    Tye Savoy , NP 01/30/2017, 1:31 PM  Addendum: Reviewed and agree with  management. Pyrtle, Lajuan Lines, MD

## 2017-01-30 NOTE — Patient Instructions (Addendum)
If you are age 58 or older, your body mass index should be between 23-30. Your Body mass index is 43.16 kg/m. If this is out of the aforementioned range listed, please consider follow up with your Primary Care Provider.  If you are age 73 or younger, your body mass index should be between 19-25. Your Body mass index is 43.16 kg/m. If this is out of the aformentioned range listed, please consider follow up with your Primary Care Provider.   Your physician has requested that you go to the basement for the following lab work before leaving today: CBC w/Diff LFT's  You have been scheduled for an ERCP. Please follow written instructions given to you at your visit today.  Stop Omeprazole.  Thank you for choosing me and [facility].   Tye Savoy, NP

## 2017-01-31 NOTE — Progress Notes (Signed)
I agree with the above note, plan 

## 2017-02-14 ENCOUNTER — Other Ambulatory Visit: Payer: Self-pay | Admitting: Family Medicine

## 2017-02-15 ENCOUNTER — Encounter (HOSPITAL_COMMUNITY): Payer: Self-pay | Admitting: Gastroenterology

## 2017-02-15 ENCOUNTER — Encounter (HOSPITAL_COMMUNITY)
Admission: RE | Disposition: A | Discharge status: Home / Self Care | Payer: Self-pay | Source: Ambulatory Visit | Attending: Gastroenterology

## 2017-02-15 ENCOUNTER — Ambulatory Visit (HOSPITAL_COMMUNITY): Payer: 59

## 2017-02-15 ENCOUNTER — Ambulatory Visit (HOSPITAL_COMMUNITY): Payer: 59 | Admitting: Anesthesiology

## 2017-02-15 ENCOUNTER — Ambulatory Visit (HOSPITAL_COMMUNITY)
Admission: RE | Admit: 2017-02-15 | Discharge: 2017-02-15 | Disposition: A | Discharge status: Home / Self Care | Payer: 59 | Source: Ambulatory Visit | Attending: Gastroenterology | Admitting: Gastroenterology

## 2017-02-15 DIAGNOSIS — K8070 Calculus of gallbladder and bile duct without cholecystitis without obstruction: Secondary | ICD-10-CM

## 2017-02-15 DIAGNOSIS — J449 Chronic obstructive pulmonary disease, unspecified: Secondary | ICD-10-CM | POA: Insufficient documentation

## 2017-02-15 DIAGNOSIS — E669 Obesity, unspecified: Secondary | ICD-10-CM | POA: Diagnosis not present

## 2017-02-15 DIAGNOSIS — Z7982 Long term (current) use of aspirin: Secondary | ICD-10-CM | POA: Insufficient documentation

## 2017-02-15 DIAGNOSIS — R1011 Right upper quadrant pain: Secondary | ICD-10-CM | POA: Diagnosis present

## 2017-02-15 DIAGNOSIS — I509 Heart failure, unspecified: Secondary | ICD-10-CM | POA: Diagnosis not present

## 2017-02-15 DIAGNOSIS — Z79899 Other long term (current) drug therapy: Secondary | ICD-10-CM | POA: Diagnosis not present

## 2017-02-15 DIAGNOSIS — I110 Hypertensive heart disease with heart failure: Secondary | ICD-10-CM | POA: Insufficient documentation

## 2017-02-15 DIAGNOSIS — F319 Bipolar disorder, unspecified: Secondary | ICD-10-CM | POA: Insufficient documentation

## 2017-02-15 DIAGNOSIS — Z9981 Dependence on supplemental oxygen: Secondary | ICD-10-CM | POA: Insufficient documentation

## 2017-02-15 DIAGNOSIS — G4730 Sleep apnea, unspecified: Secondary | ICD-10-CM | POA: Diagnosis not present

## 2017-02-15 DIAGNOSIS — Z6841 Body Mass Index (BMI) 40.0 and over, adult: Secondary | ICD-10-CM | POA: Insufficient documentation

## 2017-02-15 DIAGNOSIS — K8050 Calculus of bile duct without cholangitis or cholecystitis without obstruction: Secondary | ICD-10-CM | POA: Diagnosis not present

## 2017-02-15 DIAGNOSIS — Z87891 Personal history of nicotine dependence: Secondary | ICD-10-CM | POA: Diagnosis not present

## 2017-02-15 HISTORY — PX: ERCP: SHX5425

## 2017-02-15 SURGERY — ERCP, WITH INTERVENTION IF INDICATED
Anesthesia: Monitor Anesthesia Care

## 2017-02-15 MED ORDER — IOHEXOL 350 MG/ML SOLN
INTRAVENOUS | Status: DC | PRN
  Administered 2017-02-15: 70 mL

## 2017-02-15 MED ORDER — PHENYLEPHRINE 40 MCG/ML (10ML) SYRINGE FOR IV PUSH (FOR BLOOD PRESSURE SUPPORT)
PREFILLED_SYRINGE | INTRAVENOUS | Status: AC
  Filled 2017-02-15: qty 10

## 2017-02-15 MED ORDER — ROCURONIUM BROMIDE 50 MG/5ML IV SOSY
PREFILLED_SYRINGE | INTRAVENOUS | Status: AC
  Filled 2017-02-15: qty 5

## 2017-02-15 MED ORDER — ROCURONIUM BROMIDE 10 MG/ML (PF) SYRINGE
PREFILLED_SYRINGE | INTRAVENOUS | Status: DC | PRN
  Administered 2017-02-15: 40 mg via INTRAVENOUS

## 2017-02-15 MED ORDER — ONDANSETRON HCL 4 MG/2ML IJ SOLN
INTRAMUSCULAR | Status: DC | PRN
  Administered 2017-02-15: 4 mg via INTRAVENOUS

## 2017-02-15 MED ORDER — ONDANSETRON HCL 4 MG/2ML IJ SOLN
INTRAMUSCULAR | Status: AC
  Filled 2017-02-15: qty 2

## 2017-02-15 MED ORDER — SODIUM CHLORIDE 0.9 % IV SOLN: INTRAVENOUS | Status: DC

## 2017-02-15 MED ORDER — FENTANYL CITRATE (PF) 100 MCG/2ML IJ SOLN
INTRAMUSCULAR | Status: AC
  Filled 2017-02-15: qty 2

## 2017-02-15 MED ORDER — FENTANYL CITRATE (PF) 100 MCG/2ML IJ SOLN
INTRAMUSCULAR | Status: DC | PRN
  Administered 2017-02-15: 100 ug via INTRAVENOUS

## 2017-02-15 MED ORDER — GLUCAGON HCL RDNA (DIAGNOSTIC) 1 MG IJ SOLR
INTRAMUSCULAR | Status: AC
  Filled 2017-02-15: qty 1

## 2017-02-15 MED ORDER — PHENYLEPHRINE 40 MCG/ML (10ML) SYRINGE FOR IV PUSH (FOR BLOOD PRESSURE SUPPORT)
PREFILLED_SYRINGE | INTRAVENOUS | Status: DC | PRN
  Administered 2017-02-15: 40 ug via INTRAVENOUS

## 2017-02-15 MED ORDER — INDOMETHACIN 50 MG RE SUPP
RECTAL | Status: AC
  Filled 2017-02-15: qty 2

## 2017-02-15 MED ORDER — SUGAMMADEX SODIUM 500 MG/5ML IV SOLN
INTRAVENOUS | Status: AC
  Filled 2017-02-15: qty 5

## 2017-02-15 MED ORDER — LIDOCAINE 2% (20 MG/ML) 5 ML SYRINGE
INTRAMUSCULAR | Status: DC | PRN
  Administered 2017-02-15: 50 mg via INTRAVENOUS

## 2017-02-15 MED ORDER — LIDOCAINE 2% (20 MG/ML) 5 ML SYRINGE
INTRAMUSCULAR | Status: AC
  Filled 2017-02-15: qty 5

## 2017-02-15 MED ORDER — PROPOFOL 10 MG/ML IV BOLUS
INTRAVENOUS | Status: DC | PRN
  Administered 2017-02-15: 150 mg via INTRAVENOUS

## 2017-02-15 MED ORDER — ACETAMINOPHEN 325 MG PO TABS
650.0000 mg | ORAL_TABLET | Freq: Once | ORAL | Status: AC
  Administered 2017-02-15: 650 mg via ORAL
  Filled 2017-02-15 (×2): qty 2

## 2017-02-15 MED ORDER — PROPOFOL 10 MG/ML IV BOLUS
INTRAVENOUS | Status: AC
  Filled 2017-02-15: qty 20

## 2017-02-15 MED ORDER — SUGAMMADEX SODIUM 200 MG/2ML IV SOLN
INTRAVENOUS | Status: DC | PRN
  Administered 2017-02-15: 210 mg via INTRAVENOUS

## 2017-02-15 MED ORDER — LACTATED RINGERS IV SOLN
INTRAVENOUS | Status: DC
  Administered 2017-02-15: 12:00:00 via INTRAVENOUS

## 2017-02-15 MED ORDER — CIPROFLOXACIN IN D5W 400 MG/200ML IV SOLN
INTRAVENOUS | Status: AC
  Filled 2017-02-15: qty 200

## 2017-02-15 NOTE — Interval H&P Note (Signed)
History and Physical Interval Note:  02/15/2017 12:02 PM  Deborah [name]  has presented today for surgery, with the diagnosis of cholelithiasis, choledocholithiasis    The various methods of treatment have been discussed with the patient and family. After consideration of risks, benefits and other options for treatment, the patient has consented to  Procedure(s): ENDOSCOPIC RETROGRADE CHOLANGIOPANCREATOGRAPHY (ERCP) (N/A) as a surgical intervention .  The patient's history has been reviewed, patient examined, no change in status, stable for surgery.  I have reviewed the patient's chart and labs.  Questions were answered to the patient's satisfaction.     [name]

## 2017-02-15 NOTE — H&P (View-Only) (Signed)
HPI: Patient is a 61 year old female who I saw in early June for evaluation of worsening of chronic upper abdominal pain . Episodes had become more frequent and intense in the preceding months. Nothing seemed to help the pain except vomiting. Episodes generally lasted anywhere from 10 minutes to an hour and a half. In between episodes the patient felt totally fine. I started the patient on a daily PPI, obtained labs and abdominal ultrasound. Her AST was 100, liver studies otherwise unremarkable. Abdominal ultrasound revealed cholelithiasis as well as some inflammatory change within the gallbladder wall. No pericholecystic fluid. The bile duct was dilated proximally with tapering more distally. She was sent for an MRCP which again showed cholelithiasis without evidence for cholecystitis. There was mild biliary duct dilation with multiple calculi in the distal common bile duct all less than 1 cm. Fatty liver identified.  Deborah [name] is here for follow up. She continues to have frequent episodes of upper abdominal pain not necessarily related to eating. Sometimes she has more than one episode a day. The pain starts in RUQ then radiates across upper abdomen in a band like fashion. No improvement with PPI. Reports subjective fevers. No chest pain. No SOB.    Past Medical History:  Diagnosis Date  . Anemia    as teen  . Asthma   . CHF (congestive heart failure) ([place])   . COPD, mild ([place])   . Depression   . Diverticulitis   . Family history of anesthesia complication    vomiting  . GI bleeding   . Heart failure ([place])    New onset 07/25/14  . Histoplasmosis    left eye  . Hyperkalemia   . Hypertension   . Obesity (BMI 30-39.9)   . Pneumonia    dx wtih pneumonia on 05/27/16- seen by [facility]   . PONV (postoperative nausea and vomiting)   . Restrictive lung disease   . Shortness of breath    with exertion   . Sleep apnea    mask and oxygen at nite for sleep at 2L   . Tobacco abuse   .  Umbilical hernia     Patient's surgical history, family medical history, social history, medications and allergies were all reviewed in Epic    Physical Exam: BP 126/84 (BP Location: Left Arm, Patient Position: Sitting, Cuff Size: Normal)   Pulse 100   Ht 5\' 2"  (1.575 m) Comment: height measured without shoes  Wt 236 lb (107 kg)   BMI 43.16 kg/m   GENERAL: pleasant obese white female in in NAD PSYCH: :Pleasant, cooperative, normal affect PULM: Normal respiratory effort. On oxygen.  ABDOMEN:  soft, obese, nontender, nondistended, normal bowel sounds NEURO: Alert and oriented x 3, no focal neurologic deficits   ASSESSMENT and PLAN:   Pleasant 61 year old female with worsening of chronic, intermittent upper abdominal pain. Workup has revealed cholelithiasis and choledocholithiasis to assess urgency of ERCP.  -She looks okay. Obtaining LFTs, CBC now.  -She is going to need ERCP with stone extraction. Dr. Ardis Hughs has offered to perform the ERCP on 02/15/17. If liver studies are markedly abnormal then will need to arrange for ERCP to be done sooner.. The risks / benefits of ERCP were explained to the patient. She agrees to proceed.  -can discontinue PPI, I think we have found the cause of her pain.   2. COPD, on home 02  Addendum: Labs resulted. Her LFTs are normal. Will proceed with the ERCP on  8/2. Marland Kitchen WBC 11.5. No evidence for biliary obstruction so will hold off on empirical antibiotics for now.    Tye Savoy , NP 01/30/2017, 1:31 PM  Addendum: Reviewed and agree with  management. Pyrtle, Lajuan Lines, MD

## 2017-02-15 NOTE — Op Note (Addendum)
[facility] Patient Name: Deborah [name] Procedure Date: 02/15/2017 MRN: [id number] Attending MD: [name] , MD Date of Birth: 08/15/1955 CSN: [id number] Age: 61 Admit Type: Inpatient Procedure:                ERCP Indications:              Abnormal MRCP: intermittent abdominal pains;                            gallstones in GB and also choledocholithiasis on MRI Providers:                [name], MD, Laverta Baltimore RN, RN,                            Elspeth Cho Tech., Technician, Anne Fu CRNA, CRNA Referring MD:             Zenovia Jarred, MD Medicines:                General Anesthesia, Cipro 400 mg IV, Indomethacin                            562 mg PR Complications:            No immediate complications. Estimated blood loss:                            None Estimated Blood Loss:     Estimated blood loss: none. Procedure:                Pre-Anesthesia Assessment:                           - Prior to the procedure, a History and Physical                            was performed, and patient medications and                            allergies were reviewed. The patient's tolerance of                            previous anesthesia was also reviewed. The risks                            and benefits of the procedure and the sedation                            options and risks were discussed with the patient.                            All questions were answered, and informed consent  was obtained. Prior Anticoagulants: The patient has                            taken no previous anticoagulant or antiplatelet                            agents. ASA Grade Assessment: III - A patient with                            severe systemic disease. After reviewing the risks                            and benefits, the patient was deemed in                            satisfactory condition to undergo  the procedure.                           After obtaining informed consent, the scope was                            passed under direct vision. Throughout the                            procedure, the patient's blood pressure, pulse, and                            oxygen saturations were monitored continuously. The                            ZO-[id number] ([id number]) scope was introduced through                            the mouth, and used to inject contrast into and                            used to inject contrast into the bile duct. The                            ERCP was accomplished without difficulty. The                            patient tolerated the procedure well. Scope In: Scope Out: Findings:      The scout film was normal. The esophagus was successfully intubated       under direct vision. The scope was advanced to a normal major papilla in       the descending duodenum without detailed examination of the pharynx,       larynx and associated structures, and upper GI tract. The upper GI tract       was grossly normal. The bile duct was deeply cannulated using a 44       Autotome over a 0.35 hydrawire and then contrast was injected. The       extrahepatic bile duct was slightly dilated  throughout (max 8-97mm).       There were a few small mobile filling defects in the bile duct,       consistent with previously noted stones (MRI). There were no stricture.       The cystic duct/GB did not opacify with dye. A biliary sphincterotomy       was made with a traction (standard) sphincterotome, however I did not       feel the spincterotomy was large enough to allow passage of the CBD       stones and so I dilated the site using an 56mm hurricane dilating       balloon. There was immediate delivery of two stones after sphincterotomy       dilation (one black and smooth, the other brown and multifaceted). I       then used a 9-42mm retrieval balloon to sweep the bile duct several        times, delivering 2-3 more small stones into the duodenum. There was no       purulence. Completion, occlusion cholangiogram showed no clear remaining       filling defects. The main pancreatic duct was never cannulated or       injected with dye. Impression:               - Choledocholithiasis was found and treated with                            biliary sphincterotomy, balloon dilation of the                            sphincterotomy site and then balloon sweeping. Moderate Sedation:      N/A- Per Anesthesia Care Recommendation:           - Discharge patient to home (ambulatory).                           - Referral to general surgery to consider                            gallbladder resection ([facility] will arrange). Procedure Code(s):        --- Professional ---                           [id number], Endoscopic retrograde                            cholangiopancreatography (ERCP); with removal of                            calculi/debris from biliary/pancreatic duct(s) Diagnosis Code(s):        --- Professional ---                           K80.50, Calculus of bile duct without cholangitis                            or cholecystitis without obstruction  R93.2, Abnormal findings on diagnostic imaging of                            liver and biliary tract CPT copyright 2016 American Medical Association. All rights reserved. The codes documented in this report are preliminary and upon coder review may  be revised to meet current compliance requirements. [name], MD 02/15/2017 2:12:01 PM This report has been signed electronically. Number of Addenda: 0

## 2017-02-15 NOTE — Anesthesia Postprocedure Evaluation (Signed)
Anesthesia Post Note  Patient: Deborah [name]  Procedure(s) Performed: Procedure(s) (LRB): ENDOSCOPIC RETROGRADE CHOLANGIOPANCREATOGRAPHY (ERCP) (N/A)     Patient location during evaluation: PACU Anesthesia Type: General Level of consciousness: awake Pain management: pain level controlled Vital Signs Assessment: post-procedure vital signs reviewed and stable Respiratory status: spontaneous breathing Cardiovascular status: stable Anesthetic complications: no    Last Vitals:  Vitals:   02/15/17 1441 02/15/17 1450  BP: (!) 159/83 (!) 155/80  Pulse: 90 89  Resp: 18 (!) 22  Temp: 36.7 C     Last Pain:  Vitals:   02/15/17 1449  TempSrc:   PainSc: 5                  Shanessa Hodak

## 2017-02-15 NOTE — Anesthesia Preprocedure Evaluation (Signed)
Anesthesia Evaluation  Patient identified by MRN, date of birth, ID band Patient awake    Reviewed: Allergy & Precautions, NPO status   History of Anesthesia Complications (+) PONV  Airway Mallampati: II       Dental   Pulmonary shortness of breath, asthma , sleep apnea , pneumonia, COPD, former smoker,    breath sounds clear to auscultation       Cardiovascular hypertension, +CHF   Rhythm:Regular Rate:Normal     Neuro/Psych Depression Bipolar Disorder    GI/Hepatic Neg liver ROS, GI history noted. CG   Endo/Other  negative endocrine ROS  Renal/GU negative Renal ROS     Musculoskeletal   Abdominal   Peds  Hematology  (+) anemia ,   Anesthesia Other Findings   Reproductive/Obstetrics                             Anesthesia Physical Anesthesia Plan  ASA: III  Anesthesia Plan: MAC   Post-op Pain Management:    Induction: Intravenous  PONV Risk Score and Plan: 3 and Ondansetron, Dexamethasone, Midazolam and Propofol infusion  Airway Management Planned: Simple Face Mask  Additional Equipment:   Intra-op Plan:   Post-operative Plan:   Informed Consent: I have reviewed the patients History and Physical, chart, labs and discussed the procedure including the risks, benefits and alternatives for the proposed anesthesia with the patient or authorized representative who has indicated his/her understanding and acceptance.   Dental advisory given  Plan Discussed with: CRNA and Anesthesiologist  Anesthesia Plan Comments:         Anesthesia Quick Evaluation

## 2017-02-15 NOTE — Discharge Instructions (Signed)

## 2017-02-15 NOTE — Transfer of Care (Signed)
Immediate Anesthesia Transfer of Care Note  Patient: Deborah [name]  Procedure(s) Performed: Procedure(s): ENDOSCOPIC RETROGRADE CHOLANGIOPANCREATOGRAPHY (ERCP) (N/A)  Patient Location: PACU  Anesthesia Type:General  Level of Consciousness:  sedated, patient cooperative and responds to stimulation  Airway & Oxygen Therapy:Patient Spontanous Breathing and Patient connected to face mask oxgen  Post-op Assessment:  Report given to PACU RN and Post -op Vital signs reviewed and stable  Post vital signs:  Reviewed and stable  Last Vitals:  Vitals:   02/15/17 1146  BP: 136/79  Pulse: 93  Resp: (!) 22  Temp: 72.8 C    Complications: No apparent anesthesia complications

## 2017-02-15 NOTE — Anesthesia Procedure Notes (Signed)
Procedure Name: Intubation Date/Time: 02/15/2017 1:17 PM Performed by: Anne Fu Pre-anesthesia Checklist: Patient identified, Emergency Drugs available, Suction available, Patient being monitored and Timeout performed Patient Re-evaluated:Patient Re-evaluated prior to induction Oxygen Delivery Method: Circle system utilized Preoxygenation: Pre-oxygenation with 100% oxygen Induction Type: IV induction Ventilation: Mask ventilation without difficulty Laryngoscope Size: Mac and 4 Grade View: Grade I Tube type: Oral Tube size: 7.5 mm Number of attempts: 1 Airway Equipment and Method: Stylet Placement Confirmation: ETT inserted through vocal cords under direct vision,  positive ETCO2 and breath sounds checked- equal and bilateral Secured at: 19 cm Tube secured with: Tape Dental Injury: Teeth and Oropharynx as per pre-operative assessment

## 2017-02-16 ENCOUNTER — Telehealth: Payer: Self-pay

## 2017-02-16 ENCOUNTER — Encounter (HOSPITAL_COMMUNITY): Payer: Self-pay | Admitting: Gastroenterology

## 2017-02-16 NOTE — Telephone Encounter (Signed)
Referral to [facility] faxed for possible gallbladder surgery.

## 2017-02-16 NOTE — Telephone Encounter (Signed)
-----   Message from Jerene Bears, MD sent at 02/15/2017  8:20 PM EDT ----- Pls refer to gen surg for consideration of lap chole Thanks JMP  ----- Message ----- From: [name], MD Sent: 02/15/2017   2:14 PM To: Willia Craze, NP, Jerene Bears, MD  Carlyle Lipa, ERCP today...went smoothly.  All evident CBD stones are removed.  She'll need gen surgery referral to discuss cholecystectomy.  Thanks  DJ

## 2017-02-19 ENCOUNTER — Telehealth: Payer: Self-pay

## 2017-02-19 NOTE — Telephone Encounter (Signed)
Pt scheduled to see Dr. Kieth Brightly with [facility] 03/15/17@9am , pt to arrive there at 8:30am.

## 2017-03-05 ENCOUNTER — Other Ambulatory Visit: Payer: Self-pay | Admitting: Family Medicine

## 2017-03-05 ENCOUNTER — Encounter: Payer: Self-pay | Admitting: *Deleted

## 2017-03-07 NOTE — Telephone Encounter (Signed)
Left voicemail pt needs to schedule an appt so we can approve refill, pt to call back to make appt.

## 2017-03-07 NOTE — Telephone Encounter (Signed)
PT called and made appt Mon, 8/27 and is requesting refill.

## 2017-03-12 ENCOUNTER — Ambulatory Visit: Payer: Self-pay | Admitting: Family Medicine

## 2017-03-14 ENCOUNTER — Encounter: Payer: Self-pay | Admitting: Family Medicine

## 2017-03-14 ENCOUNTER — Ambulatory Visit (INDEPENDENT_AMBULATORY_CARE_PROVIDER_SITE_OTHER): Payer: 59 | Admitting: Family Medicine

## 2017-03-14 VITALS — BP 132/84 | HR 90 | Ht 62.0 in | Wt 231.0 lb

## 2017-03-14 DIAGNOSIS — I5032 Chronic diastolic (congestive) heart failure: Secondary | ICD-10-CM | POA: Diagnosis not present

## 2017-03-14 DIAGNOSIS — K8050 Calculus of bile duct without cholangitis or cholecystitis without obstruction: Secondary | ICD-10-CM | POA: Diagnosis not present

## 2017-03-14 DIAGNOSIS — J9611 Chronic respiratory failure with hypoxia: Secondary | ICD-10-CM

## 2017-03-14 DIAGNOSIS — J984 Other disorders of lung: Secondary | ICD-10-CM | POA: Diagnosis not present

## 2017-03-14 MED ORDER — POTASSIUM CHLORIDE CRYS ER 20 MEQ PO TBCR: EXTENDED_RELEASE_TABLET | ORAL | 3 refills | Status: DC

## 2017-03-14 MED ORDER — MONTELUKAST SODIUM 10 MG PO TABS: 10.0000 mg | ORAL_TABLET | Freq: Every day | ORAL | 0 refills | Status: DC

## 2017-03-14 MED ORDER — FUROSEMIDE 20 MG PO TABS: ORAL_TABLET | ORAL | 0 refills | Status: DC

## 2017-03-14 NOTE — Assessment & Plan Note (Signed)
eRx lasix and potassium refills sent.

## 2017-03-14 NOTE — Assessment & Plan Note (Signed)
On O2.   She asks for SIngulair refill today- eRx refill sent.

## 2017-03-14 NOTE — Progress Notes (Signed)
Subjective:   Patient ID: Deborah [name], female    DOB: 17-Feb-1956, 61 y.o.   MRN: [id number]  Deborah [name] is a pleasant 61 y.o. year old female who presents to clinic today with Medication Management  on 03/14/2017  HPI:  Doing okay.  Has been followed by GI recently for calculus of the gallbladder.  Notes reviewed.  Has appointment with surgery tomorrow- ERCP done on 8/2.  Diastolic CHF/right heart failure due to COPD- followed by Dr. Fletcher Anon. Was last seen on 11/23/16. Note reviewed. Advised to continue current dose of lasix.  BMET reassuring last month.  She is asking for a refill of her furosemide and potassium today.  Current Outpatient Prescriptions on File Prior to Visit  Medication Sig Dispense Refill  . acetaminophen (TYLENOL) 500 MG tablet Take 1,500 mg by mouth 2 (two) times daily as needed for moderate pain or headache.    . albuterol (PROVENTIL HFA;VENTOLIN HFA) 108 (90 Base) MCG/ACT inhaler Inhale 2 puffs into the lungs every 6 (six) hours as needed for wheezing or shortness of breath. 1 Inhaler 2  . albuterol (PROVENTIL) (2.5 MG/3ML) 0.083% nebulizer solution USE 1 VIAL VIA NEBULIZER EVERY 3 HOURS AS NEEDED FOR WHEEZING OR SHORTNESS OF BREATH 1125 mL 1  . aspirin 81 MG chewable tablet Chew 1 tablet (81 mg total) by mouth daily. 30 tablet 1  . CARTIA XT 120 MG 24 hr capsule TAKE ONE CAPSULE BY MOUTH DAILY 30 capsule 3  . fluticasone furoate-vilanterol (BREO ELLIPTA) 100-25 MCG/INH AEPB Inhale 1 puff into the lungs daily. 1 each 5  . furosemide (LASIX) 20 MG tablet TAKE 2 TABLETS BY MOUTH EVERY MORNING AND 1 TABLET BY MOUTH EVERY EVENING 45 tablet 0  . OXYGEN Inhale 2-4 L/min into the lungs continuous.    Marland Kitchen Spacer/Aero-Holding Chambers (AEROCHAMBER MV) inhaler Use as instructed 1 each 0  . vitamin B-12 (CYANOCOBALAMIN) 500 MCG tablet Take 500 mcg by mouth daily.     No current facility-administered medications on file prior to visit.     No Known  Allergies  Past Medical History:  Diagnosis Date  . Anemia    as teen  . Asthma   . CHF (congestive heart failure) ([place])   . COPD, mild ([place])   . Depression   . Diverticulitis   . Family history of anesthesia complication    vomiting  . GI bleeding   . Heart failure ([place])    New onset 07/25/14  . Histoplasmosis    left eye  . Hyperkalemia   . Hypertension   . Obesity (BMI 30-39.9)   . Pneumonia    dx wtih pneumonia on 05/27/16- seen by [facility]   . PONV (postoperative nausea and vomiting)   . Restrictive lung disease   . Shortness of breath    with exertion   . Sleep apnea    mask and oxygen at nite for sleep at 2L   . Tobacco abuse   . Umbilical hernia     Past Surgical History:  Procedure Laterality Date  . APPLICATION OF WOUND VAC  03/19/2013   Procedure: APPLICATION OF WOUND VAC;  Surgeon: Madilyn Hook, DO;  Location: [facility];  Service: General;;  . CESAREAN SECTION  04/12/85  . COLONOSCOPY WITH PROPOFOL N/A 06/13/2016   Procedure: COLONOSCOPY WITH PROPOFOL;  Surgeon: Jerene Bears, MD;  Location: [facility];  Service: Gastroenterology;  Laterality: N/A;  . ERCP N/A 02/15/2017   Procedure: ENDOSCOPIC RETROGRADE CHOLANGIOPANCREATOGRAPHY (ERCP);  Surgeon: [name], MD;  Location: [facility];  Service: Endoscopy;  Laterality: N/A;  . INSERTION OF MESH N/A 03/19/2013   Procedure: INSERTION OF MESH;  Surgeon: Madilyn Hook, DO;  Location: [facility];  Service: General;  Laterality: N/A;  . VENTRAL HERNIA REPAIR N/A 03/19/2013   Procedure:  OPEN VENTRAL HERNIA REPAIR WITH MESH AND APPLICATION OF WOUND VAC;  Surgeon: Madilyn Hook, DO;  Location: [facility];  Service: General;  Laterality: N/A;    Family History  Problem Relation Age of Onset  . Heart attack Mother   . Emphysema Mother        was a smoker  . Lymphoma Maternal Uncle   . Pancreatic cancer Maternal Aunt   . Emphysema Unknown        Mother  . Heart failure Unknown        Father  . Cancer Unknown         Unknown  . Colon cancer Neg Hx   . Breast cancer Neg Hx     Social History   Social History  . Marital status: Married    Spouse name: N/A  . Number of children: 1  . Years of education: N/A   Occupational History  . Retired Not Employed   Social History Main Topics  . Smoking status: Former Smoker    Packs/day: 3.00    Years: 43.00    Types: Cigarettes    Quit date: 12/17/2012  . Smokeless tobacco: Never Used  . Alcohol use No  . Drug use: No  . Sexual activity: Not on file   Other Topics Concern  . Not on file   Social History Narrative   ** Merged History Encounter **       ** Data from: 07/25/14 Enc Dept: [facility]       ** Data from: 06/05/13 Enc Dept: [facility]   Married, one son 35 yo.            The PMH, PSH, Social History, Family History, Medications, and allergies have been reviewed in [facility], and have been updated if relevant.   Review of Systems  Constitutional: Negative.   Respiratory: Positive for shortness of breath. Negative for wheezing.   Cardiovascular: Negative.   Psychiatric/Behavioral: Negative.   All other systems reviewed and are negative.      Objective:    BP 132/84   Pulse 90   Ht 5\' 2"  (1.575 m)   Wt 231 lb (104.8 kg)   SpO2 96% Comment: 2L o2  BMI 42.25 kg/m    Physical Exam  Constitutional: She is oriented to person, place, and time. She appears well-developed and well-nourished. No distress.  HENT:  Head: Normocephalic and atraumatic.  O2 Craig in place  Eyes: Conjunctivae are normal.  Cardiovascular: Normal rate and regular rhythm.   Pulmonary/Chest: Effort normal and breath sounds normal.  Musculoskeletal: Normal range of motion. She exhibits no edema.  Neurological: She is alert and oriented to person, place, and time. No cranial nerve deficit.  Skin: Skin is warm and dry. She is not diaphoretic.  Psychiatric: She has a normal mood and affect. Her behavior is normal. Judgment and thought content  normal.  Nursing note and vitals reviewed.         Assessment & Plan:   Chronic respiratory failure with hypoxia (HCC)  CRLD (chronic restrictive lung disease) secondary to super morbid obesity with mild COPD component  Choledocholithiasis No Follow-up on file.

## 2017-03-23 ENCOUNTER — Other Ambulatory Visit: Payer: Self-pay | Admitting: Family Medicine

## 2017-04-03 ENCOUNTER — Encounter: Payer: Self-pay | Admitting: Adult Health

## 2017-04-03 ENCOUNTER — Ambulatory Visit (INDEPENDENT_AMBULATORY_CARE_PROVIDER_SITE_OTHER): Payer: 59 | Admitting: Adult Health

## 2017-04-03 DIAGNOSIS — J441 Chronic obstructive pulmonary disease with (acute) exacerbation: Secondary | ICD-10-CM

## 2017-04-03 MED ORDER — PREDNISONE 10 MG PO TABS: ORAL_TABLET | ORAL | 0 refills | Status: DC

## 2017-04-03 MED ORDER — DOXYCYCLINE HYCLATE 100 MG PO TABS: 100.0000 mg | ORAL_TABLET | Freq: Two times a day (BID) | ORAL | 0 refills | Status: DC

## 2017-04-03 NOTE — Progress Notes (Signed)
@Patient  ID: Deborah [name], female    DOB: 06-04-1956, 61 y.o.   MRN: [id number]  Chief Complaint  Patient presents with  . Acute Visit    cough     Referring provider: [name], MD  HPI: 61 year old female, former smoker,followed for mild COPD and obstructive sleep apnea/hypercarbic RF on CPAP .   TEST  SIGNIFICANT EVENTS / STUDIES:  05/2013 PFTs- normal ratio, moderate restriction, FVC 54%, FEV1 56%, small airways with significant bronchodilator response, DLCO 60% 1/10 ECHO >>nml LV systolic fxn, dilated hypocontractile RV c/w cor pulmonale 1/12 VQ negative for PE.  1/13 ACE inhibitor changed to ARB and steriods increased yesterday and now wheezing better.   08/2014 PSG - 216 lbs -AHI 7/h, lowest desatn 82% 12/2016 PFT FEV1 at 59%, ratio 80, FVC 58%, positive bronchodilator response, DLCO 64%  04/03/2017  Acute OV : Copd  Patient presents for an acute office visit. Patient complains of one week of cough, congestion, wheezing and increased shortness of breath, increased nasal congestion .  Low grade fever, taking tylenol .  Remains on BREO daily. No increased SABA use.  Eating and drinking good. No n/v/d.  No chest pain, orthopnea, PND, or hemoptysis..   Planning on gallbladder surgery soon .       No Known Allergies  Immunization History  Administered Date(s) Administered  . Influenza,inj,Quad PF,6+ Mos 04/09/2013, 07/27/2014, 04/18/2016  . Pneumococcal Conjugate-13 01/01/2017  . Pneumococcal Polysaccharide-23 07/27/2014    Past Medical History:  Diagnosis Date  . Anemia    as teen  . Asthma   . CHF (congestive heart failure) ([place])   . COPD, mild ([place])   . Depression   . Diverticulitis   . Family history of anesthesia complication    vomiting  . GI bleeding   . Heart failure ([place])    New onset 07/25/14  . Histoplasmosis    left eye  . Hyperkalemia   . Hypertension   . Obesity (BMI 30-39.9)   . Pneumonia    dx wtih pneumonia on  05/27/16- seen by [facility]   . PONV (postoperative nausea and vomiting)   . Restrictive lung disease   . Shortness of breath    with exertion   . Sleep apnea    mask and oxygen at nite for sleep at 2L   . Tobacco abuse   . Umbilical hernia     Tobacco History: History  Smoking Status  . Former Smoker  . Packs/day: 3.00  . Years: 43.00  . Types: Cigarettes  . Quit date: 12/17/2012  Smokeless Tobacco  . Never Used   Counseling given: Not Answered   Outpatient Encounter Prescriptions as of 04/03/2017  Medication Sig  . acetaminophen (TYLENOL) 500 MG tablet Take 1,500 mg by mouth 2 (two) times daily as needed for moderate pain or headache.  . albuterol (PROVENTIL HFA;VENTOLIN HFA) 108 (90 Base) MCG/ACT inhaler Inhale 2 puffs into the lungs every 6 (six) hours as needed for wheezing or shortness of breath.  Marland Kitchen albuterol (PROVENTIL) (2.5 MG/3ML) 0.083% nebulizer solution USE 1 VIAL VIA NEBULIZER EVERY 3 HOURS AS NEEDED FOR WHEEZING OR SHORTNESS OF BREATH  . aspirin 81 MG chewable tablet Chew 1 tablet (81 mg total) by mouth daily.  Marland Kitchen CARTIA XT 120 MG 24 hr capsule TAKE ONE CAPSULE BY MOUTH DAILY  . fluticasone furoate-vilanterol (BREO ELLIPTA) 100-25 MCG/INH AEPB Inhale 1 puff into the lungs daily.  . furosemide (LASIX) 20 MG tablet TAKE 2 TABLETS  BY MOUTH EVERY MORNING AND 1 TABLET BY MOUTH EVERY EVENING  . montelukast (SINGULAIR) 10 MG tablet Take 1 tablet (10 mg total) by mouth at bedtime.  . OXYGEN Inhale 2-4 L/min into the lungs continuous.  . potassium chloride SA (K-DUR,KLOR-CON) 20 MEQ tablet TAKE 1/2 TABLET(10 MEQ) BY MOUTH DAILY  . Spacer/Aero-Holding Chambers (AEROCHAMBER MV) inhaler Use as instructed  . vitamin B-12 (CYANOCOBALAMIN) 500 MCG tablet Take 500 mcg by mouth daily.  . [DISCONTINUED] furosemide (LASIX) 20 MG tablet TAKE 2 TABLETS BY MOUTH EVERY MORNING AND 1 TABLET BY MOUTH EVERY EVENING  . doxycycline (VIBRA-TABS) 100 MG tablet Take 1 tablet (100 mg total) by mouth  2 (two) times daily.  . predniSONE (DELTASONE) 10 MG tablet 4 tabs for 2 days, then 3 tabs for 2 days, 2 tabs for 2 days, then 1 tab for 2 days, then stop   No facility-administered encounter medications on file as of 04/03/2017.      Review of Systems  Constitutional:   No  weight loss, night sweats,  Fevers, chills, fatigue, or  lassitude.  HEENT:   No headaches,  Difficulty swallowing,  Tooth/dental problems, or  Sore throat,                No sneezing, itching, ear ache, + nasal congestion, post nasal drip,   CV:  No chest pain,  Orthopnea, PND, swelling in lower extremities, anasarca, dizziness, palpitations, syncope.   GI  No heartburn, indigestion, abdominal pain, nausea, vomiting, diarrhea, change in bowel habits, loss of appetite, bloody stools.   Resp:    No chest wall deformity  Skin: no rash or lesions.  GU: no dysuria, change in color of urine, no urgency or frequency.  No flank pain, no hematuria   MS:  No joint pain or swelling.  No decreased range of motion.  No back pain.    Physical Exam  BP 126/74 (BP Location: Left Arm, Cuff Size: Normal)   Pulse (!) 104   Ht 5\' 3"  (1.6 m)   Wt 233 lb 3.2 oz (105.8 kg)   SpO2 99%   BMI 41.31 kg/m   GEN: A/Ox3; pleasant , NAD, obese    HEENT:  [place]/AT,  EACs-clear, TMs-wnl, NOSE-clear, THROAT-clear, no lesions, no postnasal drip or exudate noted.   NECK:  Supple w/ fair ROM; no JVD; normal carotid impulses w/o bruits; no thyromegaly or nodules palpated; no lymphadenopathy.    RESP  Few trace wheezing , , no dullness to percussion  CARD:  RRR, no m/r/g, tr  peripheral edema, pulses intact, no cyanosis or clubbing.  GI:   Soft & nt; nml bowel sounds; no organomegaly or masses detected.   Musco: Warm bil, no deformities or joint swelling noted.   Neuro: alert, no focal deficits noted.    Skin: Warm, no lesions or rashes    Lab Results:   BMET  BNP    Component Value Date/Time   BNP 166.0 (H) 12/18/2015  0003    ProBNP No results found for: PROBNP  Imaging: No results found.   Assessment & Plan:   COPD exacerbation ([place]) Flare with bronchitis   Plan  Patient Instructions  Doxycyline 100mg  Twice daily for 1 week. Take with food , wear sunscreen.  Prednisone taper over next week.  Mucinex DM Twice daily  As needed  Cough/congestion .  Fluids and rest . Follow up in 2 weeks as planned and As needed   Please contact office for sooner follow  up if symptoms do not improve or worsen or seek emergency care         Rexene Edison, NP 04/03/2017

## 2017-04-03 NOTE — Patient Instructions (Addendum)
Doxycyline 100mg  Twice daily for 1 week. Take with food , wear sunscreen.  Prednisone taper over next week.  Mucinex DM Twice daily  As needed  Cough/congestion .  Fluids and rest . Follow up in 2 weeks as planned and As needed   Please contact office for sooner follow up if symptoms do not improve or worsen or seek emergency care

## 2017-04-03 NOTE — Assessment & Plan Note (Signed)
Flare with bronchitis   Plan  Patient Instructions  Doxycyline 100mg  Twice daily for 1 week. Take with food , wear sunscreen.  Prednisone taper over next week.  Mucinex DM Twice daily  As needed  Cough/congestion .  Fluids and rest . Follow up in 2 weeks as planned and As needed   Please contact office for sooner follow up if symptoms do not improve or worsen or seek emergency care

## 2017-04-12 ENCOUNTER — Other Ambulatory Visit: Payer: Self-pay | Admitting: Acute Care

## 2017-04-12 ENCOUNTER — Other Ambulatory Visit: Payer: Self-pay | Admitting: Family Medicine

## 2017-04-23 ENCOUNTER — Other Ambulatory Visit: Payer: Self-pay | Admitting: Cardiovascular Disease

## 2017-04-24 ENCOUNTER — Other Ambulatory Visit: Payer: Self-pay | Admitting: Family Medicine

## 2017-05-07 ENCOUNTER — Ambulatory Visit: Payer: Self-pay | Admitting: Pulmonary Disease

## 2017-05-10 ENCOUNTER — Other Ambulatory Visit: Payer: Self-pay | Admitting: Adult Health

## 2017-05-14 ENCOUNTER — Ambulatory Visit (INDEPENDENT_AMBULATORY_CARE_PROVIDER_SITE_OTHER): Payer: 59 | Admitting: Pulmonary Disease

## 2017-05-14 ENCOUNTER — Encounter: Payer: Self-pay | Admitting: Pulmonary Disease

## 2017-05-14 DIAGNOSIS — J449 Chronic obstructive pulmonary disease, unspecified: Secondary | ICD-10-CM | POA: Diagnosis not present

## 2017-05-14 DIAGNOSIS — G4733 Obstructive sleep apnea (adult) (pediatric): Secondary | ICD-10-CM | POA: Diagnosis not present

## 2017-05-14 DIAGNOSIS — K8050 Calculus of bile duct without cholangitis or cholecystitis without obstruction: Secondary | ICD-10-CM | POA: Diagnosis not present

## 2017-05-14 MED ORDER — TIOTROPIUM BROMIDE MONOHYDRATE 2.5 MCG/ACT IN AERS: 2.0000 | INHALATION_SPRAY | Freq: Every day | RESPIRATORY_TRACT | 3 refills | Status: DC

## 2017-05-14 NOTE — Assessment & Plan Note (Signed)
Continue CPAP 10 cm. Weight loss encouraged, compliance with goal of at least 4-6 hrs every night is the expectation. Advised against medications with sedative side effects Cautioned against driving when sleepy - understanding that sleepiness will vary on a day to day basis

## 2017-05-14 NOTE — Assessment & Plan Note (Signed)
We will add Spiriva To her current regimen of breo and albuterol nebs If this works then, we can also change her from breo to trelegy.  However I am not fully convinced that all her symptoms are related to airway obstruction after reviewing her PFTs

## 2017-05-14 NOTE — Patient Instructions (Signed)
Trial of spiriva once daily -sample- call u sback if this owrks Cleared for surgery with due risk

## 2017-05-14 NOTE — Progress Notes (Signed)
   Subjective:    Patient ID: Deborah [name], female    DOB: 1955/09/30, 61 y.o.   MRN: [id number]  HPI  45 yof for FU of  mild COPD, OSA  adm 07/2013 with hypercarbic, hypoxic resp failure due to volume overload from previously undx CHF.        12/2015 adm for hypercarbic resp failure,Improved with BiPAP  She smoked up to 3 packs per day for 43 years before quitting in 2015. She is compliant with oxygen She had an acute visit 03/2017 and was given an low-dose prednisone for COPD exacerbation.  She feels improved but not fully back to her baseline.  She is still coughing up minimal amounts of clear white sputum and denies wheezing but gets short of breath after walking for short distances.  She underwent ERCP for CBD stone in 02/2017, surgery was recommended but she has not had further contact with the surgeon.  She also needs surgical clearance She is compliant with Breo and prefers this to Symbicort which gave her cramps in her hands  She remains compliant with her CPAP machine.  Download was reviewed which shows excellent control of events and good compliance on 10 cm with minimal leak    SIGNIFICANT EVENTS / STUDIES:  05/2013 PFTs- normal ratio, moderate restriction, FVC 54%, FEV1 56%, small airways with significant bronchodilator response, DLCO 60%  08/2014 PSG - 216 lbs -AHI 7/h, lowest desatn 82%       12/2016 PFT FEV1 at 59%, ratio 80, FVC 58%, positive bronchodilator response, DLCO 64%   Past Medical History:  Diagnosis Date  . Anemia    as teen  . Asthma   . CHF (congestive heart failure) ([place])   . COPD, mild ([place])   . Depression   . Diverticulitis   . Family history of anesthesia complication    vomiting  . GI bleeding   . Heart failure ([place])    New onset 07/25/14  . Histoplasmosis    left eye  . Hyperkalemia   . Hypertension   . Obesity (BMI 30-39.9)   . Pneumonia    dx wtih pneumonia on 05/27/16- seen by [facility]   . PONV (postoperative nausea and  vomiting)   . Restrictive lung disease   . Shortness of breath    with exertion   . Sleep apnea    mask and oxygen at nite for sleep at 2L   . Tobacco abuse   . Umbilical hernia      Review of Systems neg for any significant sore throat, dysphagia, itching, sneezing, nasal congestion or excess/ purulent secretions, fever, chills, sweats, unintended wt loss, pleuritic or exertional cp, hempoptysis, orthopnea pnd or change in chronic leg swelling. Also denies presyncope, palpitations, heartburn, abdominal pain, nausea, vomiting, diarrhea or change in bowel or urinary habits, dysuria,hematuria, rash, arthralgias, visual complaints, headache, numbness weakness or ataxia.     Objective:   Physical Exam   Gen. Pleasant, obese, in no distress ENT - no lesions, no post nasal drip Neck: No JVD, no thyromegaly, no carotid bruits Lungs: no use of accessory muscles, no dullness to percussion, decreased without rales or rhonchi  Cardiovascular: Rhythm regular, heart sounds  normal, no murmurs or gallops, no peripheral edema Musculoskeletal: No deformities, no cyanosis or clubbing , no tremors        Assessment & Plan:

## 2017-05-14 NOTE — Assessment & Plan Note (Signed)
Given her prior history of respiratory failure, she would certainly be at increased risk for postop surgical complications obviously this risk would be lower if she undergoes laparoscopic cholecystectomy.

## 2017-05-28 ENCOUNTER — Telehealth: Payer: Self-pay | Admitting: Cardiovascular Disease

## 2017-05-28 ENCOUNTER — Encounter: Payer: Self-pay | Admitting: Cardiovascular Disease

## 2017-05-28 ENCOUNTER — Other Ambulatory Visit: Payer: Self-pay

## 2017-05-28 ENCOUNTER — Ambulatory Visit (INDEPENDENT_AMBULATORY_CARE_PROVIDER_SITE_OTHER): Payer: 59 | Admitting: Cardiovascular Disease

## 2017-05-28 VITALS — BP 118/72 | HR 94 | Ht 63.0 in | Wt 232.2 lb

## 2017-05-28 DIAGNOSIS — I10 Essential (primary) hypertension: Secondary | ICD-10-CM

## 2017-05-28 DIAGNOSIS — I5032 Chronic diastolic (congestive) heart failure: Secondary | ICD-10-CM | POA: Diagnosis not present

## 2017-05-28 DIAGNOSIS — Z01810 Encounter for preprocedural cardiovascular examination: Secondary | ICD-10-CM

## 2017-05-28 DIAGNOSIS — Z23 Encounter for immunization: Secondary | ICD-10-CM

## 2017-05-28 NOTE — Patient Instructions (Addendum)
Medication Instructions:  Your physician recommends that you continue on your current medications as directed. Please refer to the Current Medication list given to you today. Per your pharmacy, you have been prescribed lasix 20mg  . Take 2 tablets (40mg ) in the morning and 1 tablet (20mg ) in the evening. Please call back if you are taking a different dosage.   Labwork: none  Testing/Procedures: none  Follow-Up: Your physician wants you to follow-up in: 6 months with Dr. Fletcher Anon. You will receive a reminder letter in the mail two months in advance. If you don't receive a letter, please call our office to schedule the follow-up appointment.   Any Other Special Instructions Will Be Listed Below (If Applicable). You are at moderate risk for your upcoming surgery.      If you need a refill on your cardiac medications before your next appointment, please call your pharmacy.

## 2017-05-28 NOTE — Telephone Encounter (Signed)
Pt calls back to confirm she is taking lasix 20mg  tablets; 40mg  in the AM and 20mg  in the PM.

## 2017-05-28 NOTE — Progress Notes (Signed)
Cardiology Office Note   Date:  05/28/2017   ID:  Mialee, Weyman 1955-11-08, MRN [id number]  PCP:  [name], MD  Cardiologist:   Kathlyn Sacramento, MD   Chief Complaint  Patient presents with  . other    6 month follow up. Meds reviewed by the pt. verbally. "doing well."       History of Present Illness: Deborah [name] is a 61 y.o. female who presents for a follow-up visit regarding chronic diastolic heart failure and chronic right heart failure due to COPD. She has known history of previous tobacco use with COPD, hypertension, Obesity and bipolar disorder. She was hospitalized in January, 2016 for CHF.  She had moderate size left pleural effusion. Echocardiogram showed an ejection fraction of 95-62%, grade 1 diastolic dysfunction, dilated right ventricle with hypokinesis and mild pulmonary hypertension.    She quit smoking completely since then .VQ scan was low probability for pulmonary embolism.  Volume overload was controlled with furosemide. She was hospitalized in August with cholelithiasis.  She underwent ERCP with bile duct stones.  She is supposed to get a cholecystectomy done. She reports stable symptoms overall.  No recent chest pain.  Dyspnea is stable.  She continues to take furosemide 40 mg in the morning and 20 mg in the afternoon.  This dose was verified after calling his pharmacy.  Past Medical History:  Diagnosis Date  . Anemia    as teen  . Asthma   . CHF (congestive heart failure) ([place])   . COPD, mild ([place])   . Depression   . Diverticulitis   . Family history of anesthesia complication    vomiting  . GI bleeding   . Heart failure ([place])    New onset 07/25/14  . Histoplasmosis    left eye  . Hyperkalemia   . Hypertension   . Obesity (BMI 30-39.9)   . Pneumonia    dx wtih pneumonia on 05/27/16- seen by [facility]   . PONV (postoperative nausea and vomiting)   . Restrictive lung disease   . Shortness of breath    with exertion   . Sleep apnea     mask and oxygen at nite for sleep at 2L   . Tobacco abuse   . Umbilical hernia     Past Surgical History:  Procedure Laterality Date  . CESAREAN SECTION  04/12/85     Current Outpatient Medications  Medication Sig Dispense Refill  . acetaminophen (TYLENOL) 500 MG tablet Take 1,500 mg by mouth 2 (two) times daily as needed for moderate pain or headache.    . albuterol (PROVENTIL) (2.5 MG/3ML) 0.083% nebulizer solution USE 1 VIAL VIA NEBULIZER EVERY 3 HOURS AS NEEDED FOR WHEEZING OR SHORTNESS OF BREATH 1125 mL 1  . aspirin 81 MG chewable tablet Chew 1 tablet (81 mg total) by mouth daily. 30 tablet 1  . BREO ELLIPTA 100-25 MCG/INH AEPB INHALE 1 PUFF BY MOUTH INTO THE LUNGS DAILY 1 each 5  . CARTIA XT 120 MG 24 hr capsule TAKE ONE CAPSULE BY MOUTH DAILY 90 capsule 0  . furosemide (LASIX) 20 MG tablet TAKE 2 TABLETS BY MOUTH EVERY MORNING AND 1 TABLET BY MOUTH EVERY EVENING (Patient taking differently: TAKE 1 TABLET BY MOUTH EVERY MORNING AND 1/2 TABLET BY MOUTH EVERY EVENING) 45 tablet 5  . montelukast (SINGULAIR) 10 MG tablet TAKE 1 TABLET BY MOUTH EVERY NIGHT AT BEDTIME 90 tablet 0  . OXYGEN Inhale 2-4 L/min into  the lungs continuous.    . potassium chloride SA (K-DUR,KLOR-CON) 20 MEQ tablet TAKE 1/2 TABLET(10 MEQ) BY MOUTH DAILY 45 tablet 3  . Spacer/Aero-Holding Chambers (AEROCHAMBER MV) inhaler Use as instructed 1 each 0  . Tiotropium Bromide Monohydrate (SPIRIVA RESPIMAT) 2.5 MCG/ACT AERS Inhale 2 puffs into the lungs daily. 1 Inhaler 3  . VENTOLIN HFA 108 (90 Base) MCG/ACT inhaler INHALE 2 PUFFS BY MOUTH EVERY 6 HOURS AS NEEDED FOR WHEEZING OR SHORTNESS OF BREATH 18 g 5  . vitamin B-12 (CYANOCOBALAMIN) 500 MCG tablet Take 500 mcg by mouth daily.     No current facility-administered medications for this visit.     Allergies:   Patient has no known allergies.    Social History:  The patient  reports that she quit smoking about 4 years ago. Her smoking use included cigarettes. She  has a 129.00 pack-year smoking history. she has never used smokeless tobacco. She reports that she does not drink alcohol or use drugs.   Family History:  The patient's family history includes Cancer in her unknown relative; Emphysema in her mother and unknown relative; Heart attack in her mother; Heart failure in her unknown relative; Lymphoma in her maternal uncle; Pancreatic cancer in her maternal aunt.    ROS:  Please see the history of present illness.   Otherwise, review of systems are positive for none.   All other systems are reviewed and negative.    PHYSICAL EXAM: VS:  BP 118/72 (BP Location: Left Arm, Patient Position: Sitting, Cuff Size: Large)   Pulse 94   Ht 5\' 3"  (1.6 m)   Wt 232 lb 4 oz (105.3 kg)   BMI 41.14 kg/m  , BMI Body mass index is 41.14 kg/m. GEN: Well nourished, well developed, in no acute distress  HEENT: normal  Neck: no JVD, carotid bruits, or masses Cardiac: RRR; no murmurs, rubs, or gallops,no edema  Respiratory:  clear to auscultation bilaterally with diminished breath sounds, normal work of breathing GI: soft, nontender, nondistended, + BS MS: no deformity or atrophy  Skin: warm and dry, no rash Neuro:  Strength and sensation are intact Psych: euthymic mood, full affect   EKG:  EKG is ordered today. The ekg ordered today demonstrates normal sinus rhythm with nonspecific anterior T wave changes   Recent Labs: 01/18/2017: BUN 13; Creatinine, Ser 0.84; Potassium 4.0; Sodium 137 01/30/2017: ALT 20; Hemoglobin 13.7; Platelets 289.0    Lipid Panel    Component Value Date/Time   CHOL 200 03/29/2015 1216   TRIG 98.0 03/29/2015 1216   HDL 44.60 03/29/2015 1216   CHOLHDL 4 03/29/2015 1216   VLDL 19.6 03/29/2015 1216   LDLCALC 136 (H) 03/29/2015 1216      Wt Readings from Last 3 Encounters:  05/28/17 232 lb 4 oz (105.3 kg)  05/14/17 230 lb (104.3 kg)  04/03/17 233 lb 3.2 oz (105.8 kg)        ASSESSMENT AND PLAN:  1.  Chronic diastolic  heart failure: She appears to be euvolemic on current dose of furosemide.   I reviewed her labs in July which showed normal renal function.  Sinus tachycardia is reasonably controlled with diltiazem.  2. Essential hypertension: Blood pressure  is controlled on diltiazem.  3.  Preoperative cardiovascular evaluation for cholecystectomy: The patient's risk is higher from a pulmonary standpoint than a cardiac standpoint.  She is considered at moderate risk from a cardiac standpoint.  Stress testing is not going to be helpful in her situation.  Disposition:   FU with me in 6 months  Signed,  Kathlyn Sacramento, MD  05/28/2017 4:03 PM    [facility]

## 2017-06-04 NOTE — Addendum Note (Signed)
Addended by: Georgiana Shore on: 06/04/2017 03:01 PM   Modules accepted: Orders

## 2017-06-11 IMAGING — CT CT ABD-PELV W/ CM
2 of 5 series · 15 of 46 positions shown, 17 images · IV contrast (ISOVUE)
Comparison: None.

CLINICAL DATA: Patient states she had 4 black bloody stools today

EXAM:
CT ABDOMEN AND PELVIS WITH CONTRAST
TECHNIQUE: Multidetector CT imaging of the abdomen and pelvis was performed
using the standard protocol following bolus administration of
intravenous contrast.
CONTRAST:  100mL P7T6TG-500 IOPAMIDOL (P7T6TG-500) INJECTION 61%

[Series 2: abd/pel with · axial · 0.78mm/px · z∈[+937,+1347]mm · 12 of 96 slices shown, 14 images]
[im 7/96  soft-tissue]
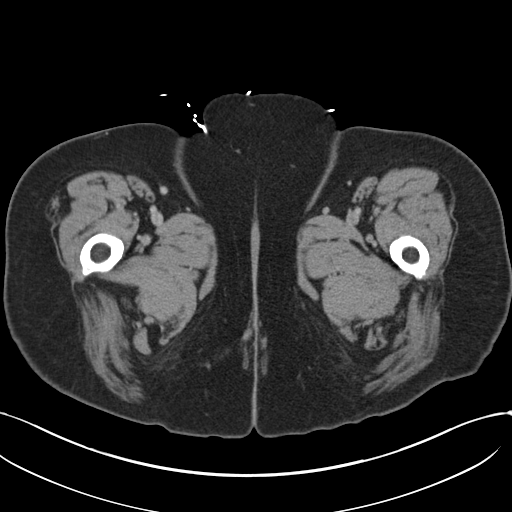
[im 7/96  bone]
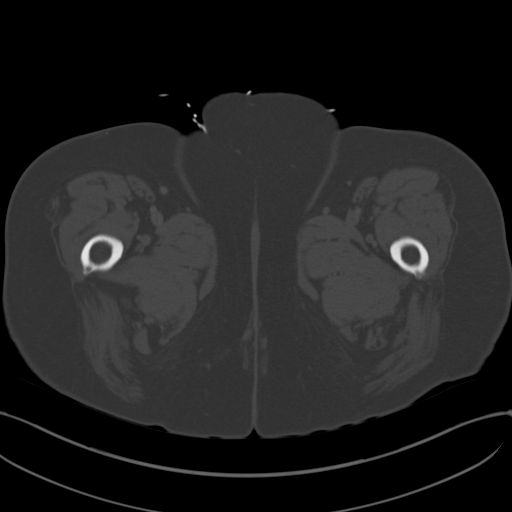
[im 14/96  soft-tissue]
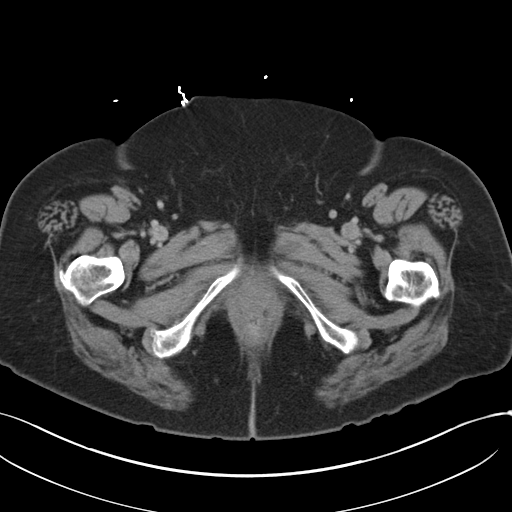
[im 21/96  soft-tissue]
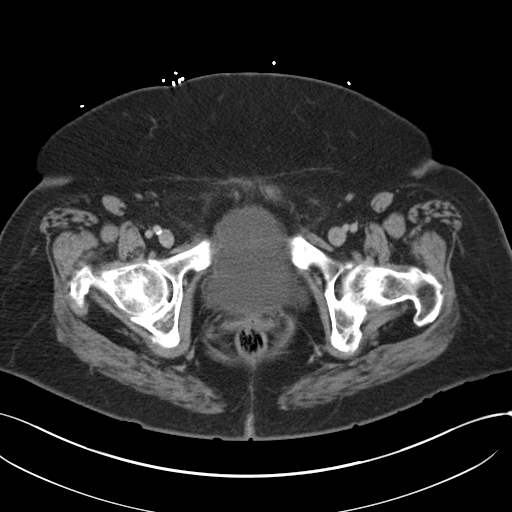
[im 28/96  soft-tissue]
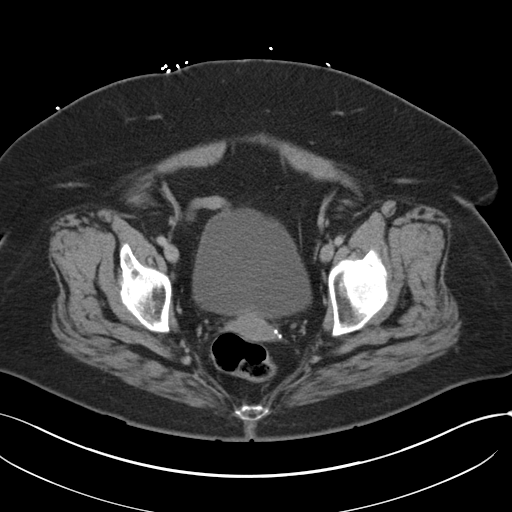
[im 34/96  soft-tissue]
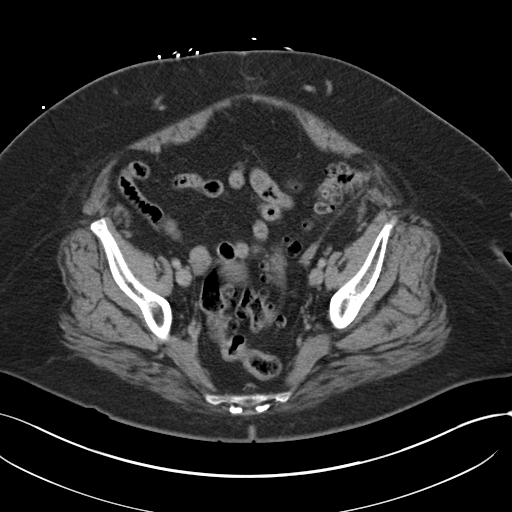
[im 41/96  soft-tissue]
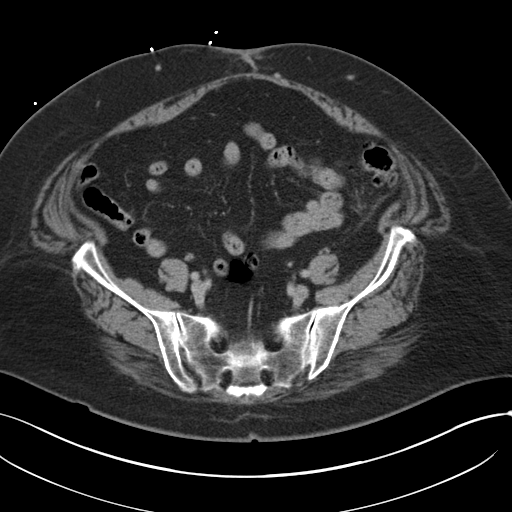
[im 55/96  soft-tissue]
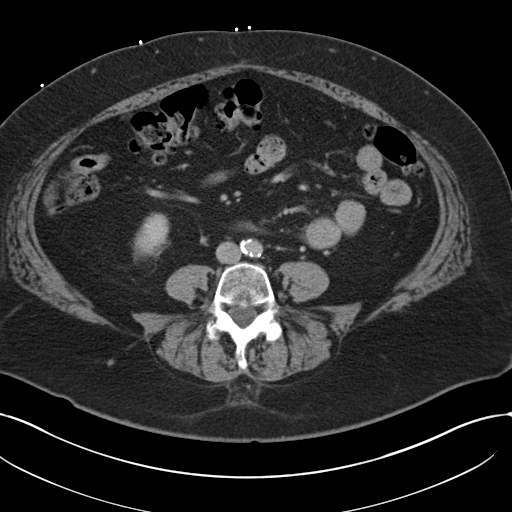
[im 62/96  soft-tissue]
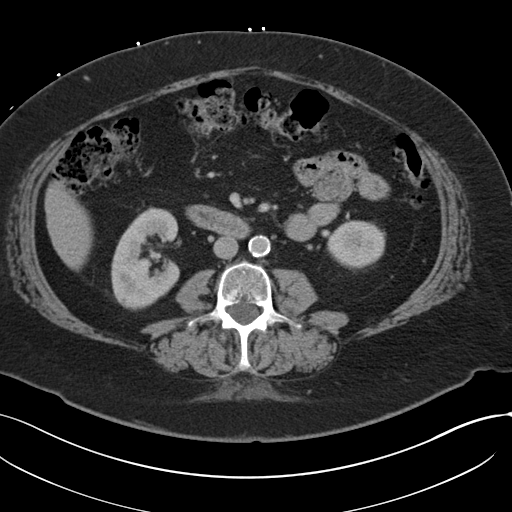
[im 68/96  soft-tissue]
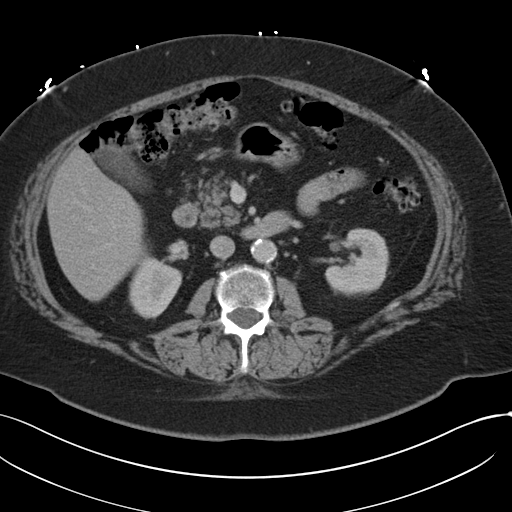
[im 68/96  bone]
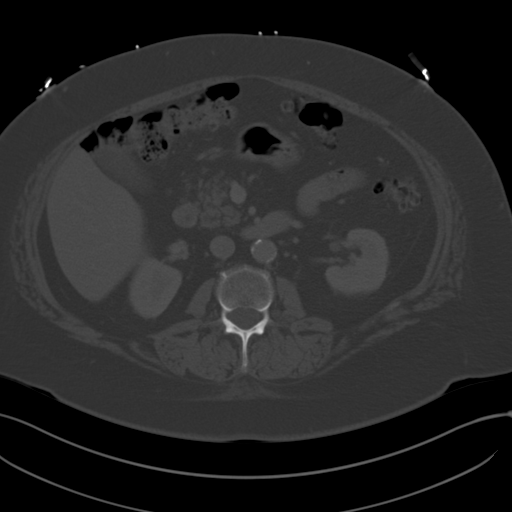
[im 75/96  soft-tissue]
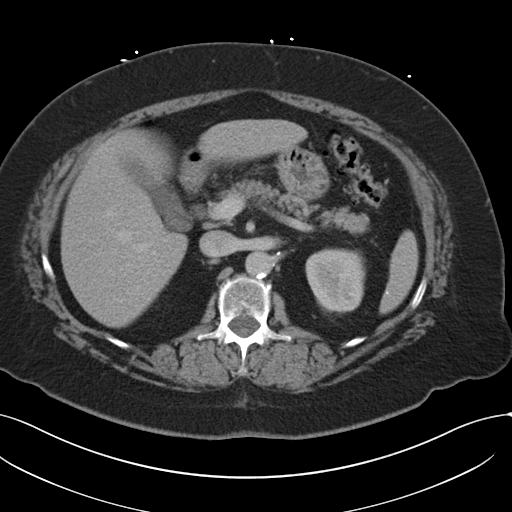
[im 82/96  soft-tissue]
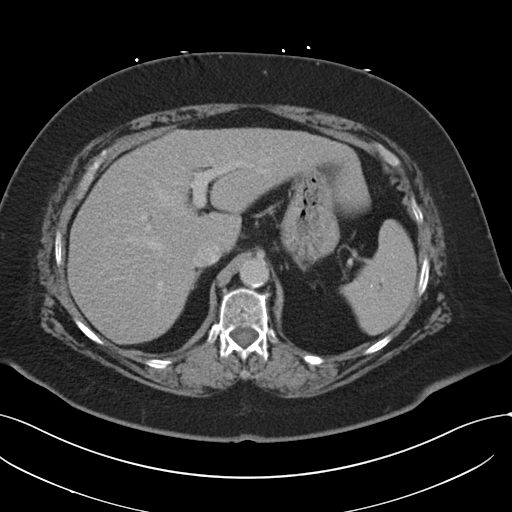
[im 89/96  soft-tissue]
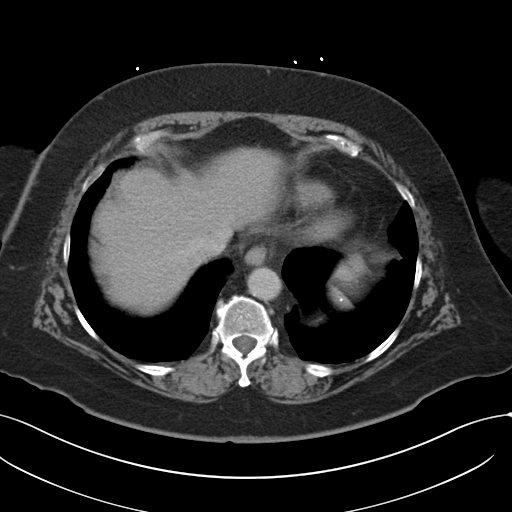

[Series 4: coronal a/|p · coronal · 0.74mm/px · 3 of 191 slices shown]
[im 64/191  soft-tissue]
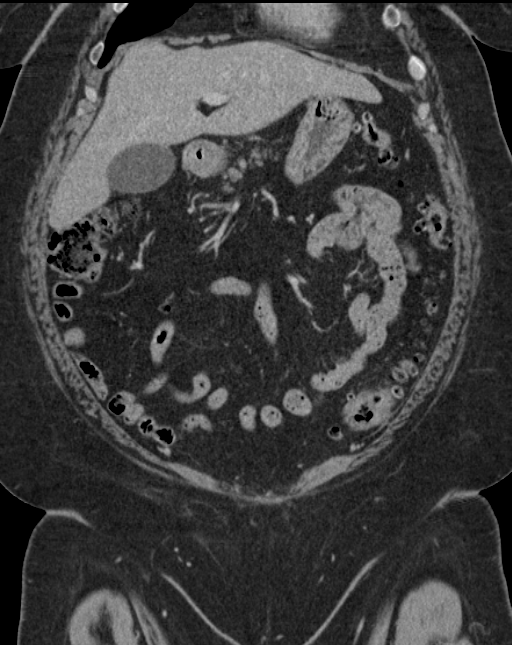
[im 85/191  soft-tissue]
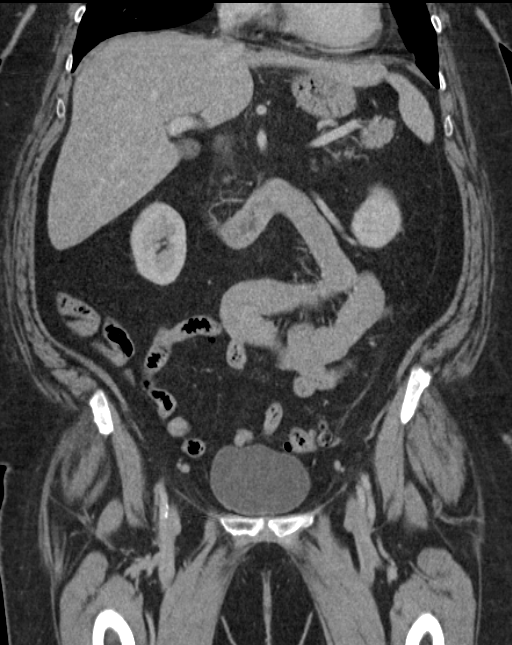
[im 106/191  soft-tissue]
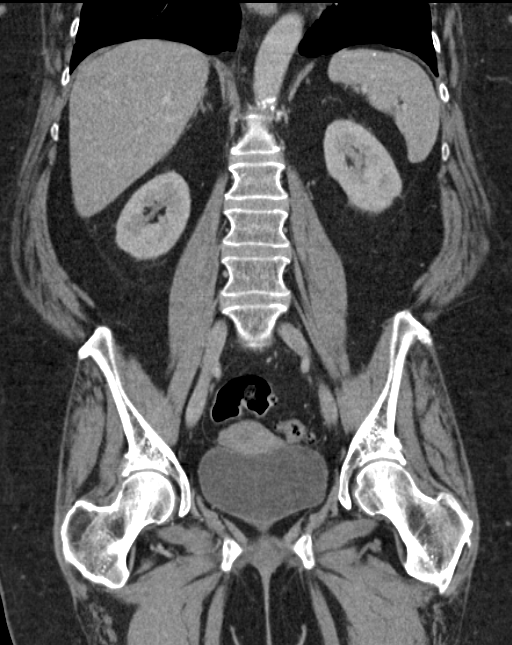

[15 of 46 positions shown; findings below may reference images not displayed]

FINDINGS: Lower chest: Lung bases shows mild emphysematous changes. Small
calcified granulomas are noted right base posteriorly.

Hepatobiliary: Enhanced liver is unremarkable. No calcified
gallstones are noted within gallbladder.

Pancreas: Enhanced pancreas is unremarkable.

Spleen: Punctate calcifications are noted within spleen from prior
granulomatous disease. No focal splenic mass.

Adrenals/Urinary Tract: No adrenal gland mass. Enhanced kidneys are
symmetrical in size. No hydronephrosis or hydroureter.

Stomach/Bowel: There is no gastric outlet obstruction. No small
bowel obstruction. No pericecal inflammation. Normal appendix is
noted in axial image 41. Scattered colonic diverticula are noted
right colon. Multiple colonic diverticula are noted transverse colon
and descending colon. Multiple colonic diverticula are noted in
sigmoid colon. In axial image 62 there is focal thickening of
colonic wall at the junction of descending colon with proximal
sigmoid colon left lower quadrant. Minimal stranding of surrounding
fat. Please see coronal image 64. Findings are highly suspicious for
focal colitis or early diverticulitis. Tiny diverticular bleed
cannot be excluded. Clinical correlation is necessary. There is no
evidence of pericolonic abscess or hematoma. No extraluminal air is
noted. No distal colonic obstruction.

Vascular/Lymphatic: Atherosclerotic calcifications of abdominal
aorta and iliac arteries are noted. No aortic aneurysm.

Reproductive: The uterus is anteflexed normal size for age. No
adnexal masses noted.

Other: No ascites or free abdominal air.

Musculoskeletal: No destructive bony lesions are noted. Mild
degenerative changes lower thoracic and lumbar spine.
IMPRESSION: 1. Extensive colonic diverticulosis noted. In axial image 62 there
is mild focal thickening of diverticular wall and colonic wall in
left lower quadrant at the junction of descending colon with sigmoid
colon. Mild stranding of pericolonic fat. Focal colitis early
diverticulitis or small diverticular bleed cannot be excluded. There
is no evidence of pericolonic abscess or hematoma. No extraluminal
air.
2. Normal appendix.  No pericecal inflammation.
3. No hydronephrosis or hydroureter.
4. No small bowel obstruction.

## 2017-06-12 IMAGING — DX DG CHEST 1V PORT
1 series · 1 of 1 positions shown · non-contrast
Comparison: Chest radiograph performed 12/27/2015

CLINICAL DATA: Acute onset of bloody stools and shortness of
breath. Initial encounter.

EXAM:
PORTABLE CHEST 1 VIEW

[chest ap]
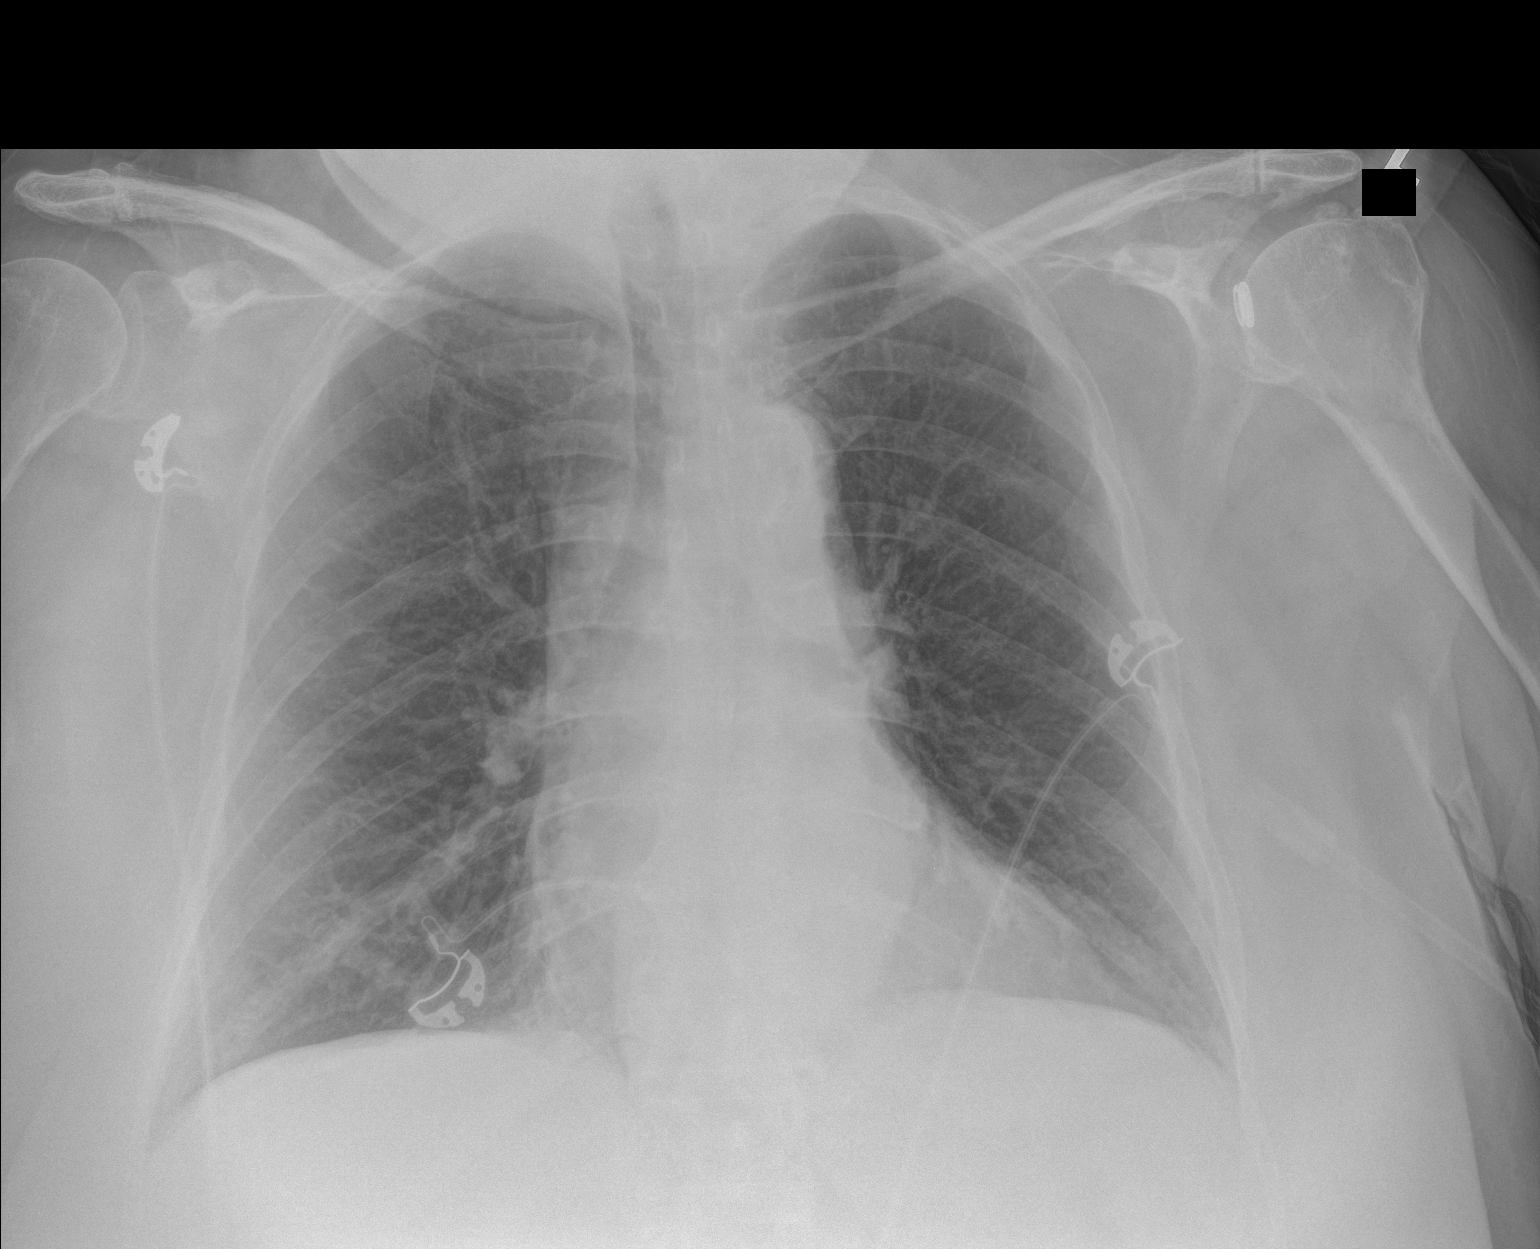

[1 of 1 positions shown; findings below may reference images not displayed]

FINDINGS: The lungs are well-aerated and clear. There is no evidence of focal
opacification, pleural effusion or pneumothorax.

The cardiomediastinal silhouette is borderline enlarged. No acute
osseous abnormalities are seen.
IMPRESSION: Borderline cardiomegaly.  Lungs remain grossly clear.

## 2017-06-13 ENCOUNTER — Other Ambulatory Visit: Payer: Self-pay | Admitting: Pulmonary Disease

## 2017-06-14 ENCOUNTER — Other Ambulatory Visit: Payer: Self-pay | Admitting: Pulmonary Disease

## 2017-06-18 ENCOUNTER — Encounter: Payer: Self-pay | Admitting: Internal Medicine

## 2017-07-10 ENCOUNTER — Other Ambulatory Visit: Payer: Self-pay | Admitting: Cardiovascular Disease

## 2017-07-11 NOTE — Telephone Encounter (Signed)
Please advise if ok to refill potassium.

## 2017-07-17 ENCOUNTER — Other Ambulatory Visit: Payer: Self-pay | Admitting: Family Medicine

## 2017-07-19 ENCOUNTER — Other Ambulatory Visit: Payer: Self-pay | Admitting: Family Medicine

## 2017-07-23 ENCOUNTER — Other Ambulatory Visit: Payer: Self-pay | Admitting: Cardiovascular Disease

## 2017-08-08 ENCOUNTER — Encounter: Payer: Self-pay | Admitting: *Deleted

## 2017-08-13 ENCOUNTER — Other Ambulatory Visit: Payer: Self-pay | Admitting: Family Medicine

## 2017-08-15 ENCOUNTER — Ambulatory Visit: Payer: Self-pay | Admitting: Adult Health

## 2017-08-29 ENCOUNTER — Encounter: Payer: Self-pay | Admitting: Adult Health

## 2017-08-29 ENCOUNTER — Ambulatory Visit: Payer: 59 | Admitting: Adult Health

## 2017-08-29 DIAGNOSIS — J9611 Chronic respiratory failure with hypoxia: Secondary | ICD-10-CM | POA: Diagnosis not present

## 2017-08-29 DIAGNOSIS — G4733 Obstructive sleep apnea (adult) (pediatric): Secondary | ICD-10-CM

## 2017-08-29 DIAGNOSIS — J449 Chronic obstructive pulmonary disease, unspecified: Secondary | ICD-10-CM

## 2017-08-29 NOTE — Assessment & Plan Note (Signed)
Controlled on rx   Plan  Patient Instructions  Continue on Oxygen 2l/m rest and 4l/m activity .  Continue on BREO daily , rinse after use.  Continue on Spiriva daily . Rinse after use.  Continue on CPAP At bedtime  With oxgyen .  Do not drive if sleepy  Work on healthy weight .  Follow up Dr. Elsworth Soho  In 3-4 months and. As needed

## 2017-08-29 NOTE — Progress Notes (Signed)
Reviewed & agree with plan  

## 2017-08-29 NOTE — Progress Notes (Signed)
@Patient  ID: Deborah [name], female    DOB: 25-Jan-1956, 62 y.o.   MRN: [id number]  Chief Complaint  Patient presents with  . Follow-up    COPD    Referring provider: [name], MD  HPI: 61 year old female, former smoker,followed for mild COPD and obstructive sleep apnea/hypercarbic RF on CPAP .   TEST  SIGNIFICANT EVENTS / STUDIES:  05/2013 PFTs- normal ratio, moderate restriction, FVC 54%, FEV1 56%, small airways with significant bronchodilator response, DLCO 60% 1/10 ECHO >>nml LV systolic fxn, dilated hypocontractile RV c/w cor pulmonale 1/12 VQ negative for PE.  1/13 ACE inhibitor changed to ARB and steriods increased yesterday and now wheezing better.   08/2014 PSG - 216 lbs -AHI 7/h, lowest desatn 82% 12/2016 PFT FEV1 at 59%, ratio 80, FVC 58%, positive bronchodilator response, DLCO 64%      08/29/2017 Follow up  ;COPD and OSA  Patient presents for a 87-month follow-up.  Patient  is followed for mild COPD.  Patient has no significant obstruction on PFT more of a restrictive pattern.  She remains on Breo daily . Posey Rea was added last office visit ., says it has made a big difference. Says she is doing better. Had a cold few weeks that went away with otc cold meds.   Patient has a sleep apnea is on CPAP at bedtime.  Patient says she feels rested on CPAP.  No significant daytime sleepiness.  Download has been requested.  Remains on Oxygen 2l/m rest and 4l/m with activity .   PVX , Prevnar and Flu vaccines are utd.    No Known Allergies  Immunization History  Administered Date(s) Administered  . Influenza,inj,Quad PF,6+ Mos 04/09/2013, 07/27/2014, 04/18/2016, 05/28/2017  . Pneumococcal Conjugate-13 01/01/2017  . Pneumococcal Polysaccharide-23 07/27/2014    Past Medical History:  Diagnosis Date  . Anemia    as teen  . Asthma   . CHF (congestive heart failure) ([place])   . COPD, mild ([place])   . Depression   . Diverticulitis   . Family history of  anesthesia complication    vomiting  . GI bleeding   . Heart failure ([place])    New onset 07/25/14  . Histoplasmosis    left eye  . Hyperkalemia   . Hypertension   . Obesity (BMI 30-39.9)   . Pneumonia    dx wtih pneumonia on 05/27/16- seen by [facility]   . PONV (postoperative nausea and vomiting)   . Restrictive lung disease   . Shortness of breath    with exertion   . Sleep apnea    mask and oxygen at nite for sleep at 2L   . Tobacco abuse   . Umbilical hernia     Tobacco History: Social History   Tobacco Use  Smoking Status Former Smoker  . Packs/day: 3.00  . Years: 43.00  . Pack years: 129.00  . Types: Cigarettes  . Last attempt to quit: 12/17/2012  . Years since quitting: 4.7  Smokeless Tobacco Never Used   Counseling given: Not Answered   Outpatient Encounter Medications as of 08/29/2017  Medication Sig  . acetaminophen (TYLENOL) 500 MG tablet Take 1,500 mg by mouth 2 (two) times daily as needed for moderate pain or headache.  . albuterol (PROVENTIL) (2.5 MG/3ML) 0.083% nebulizer solution USE 1 VIAL VIA NEBULIZER EVERY 3 HOURS AS NEEDED FOR WHEEZING OR SHORTNESS OF BREATH  . aspirin 81 MG chewable tablet Chew 1 tablet (81 mg total) by mouth daily.  Marland Kitchen BREO  ELLIPTA 100-25 MCG/INH AEPB INHALE 1 PUFF BY MOUTH INTO THE LUNGS DAILY  . CARTIA XT 120 MG 24 hr capsule TAKE ONE CAPSULE BY MOUTH DAILY  . furosemide (LASIX) 20 MG tablet TAKE 2 TABLETS BY MOUTH EVERY MORNING& 1 TABLET BY MOUTH EVERY EVENING(BEFORE 3PM)  . ipratropium-albuterol (DUONEB) 0.5-2.5 (3) MG/3ML SOLN USE 1 VIAL VIA NEBULIZER EVERY 6 HOURS AS NEEDED  . montelukast (SINGULAIR) 10 MG tablet TAKE 1 TABLET(10 MG) BY MOUTH AT BEDTIME  . OXYGEN Inhale 2-4 L/min into the lungs continuous.  . potassium chloride SA (K-DUR,KLOR-CON) 20 MEQ tablet TAKE 1/2 TABLET(10 MEQ) BY MOUTH DAILY  . Spacer/Aero-Holding Chambers (AEROCHAMBER MV) inhaler Use as instructed  . Tiotropium Bromide Monohydrate (SPIRIVA RESPIMAT) 2.5  MCG/ACT AERS Inhale 2 puffs into the lungs daily.  . VENTOLIN HFA 108 (90 Base) MCG/ACT inhaler INHALE 2 PUFFS BY MOUTH EVERY 6 HOURS AS NEEDED FOR WHEEZING OR SHORTNESS OF BREATH  . vitamin B-12 (CYANOCOBALAMIN) 500 MCG tablet Take 500 mcg by mouth daily.  . [DISCONTINUED] furosemide (LASIX) 20 MG tablet TAKE 2 TABLETS BY MOUTH EVERY MORNING AND 1 TABLET BY MOUTH EVERY EVENING  . [DISCONTINUED] montelukast (SINGULAIR) 10 MG tablet TAKE 1 TABLET BY MOUTH EVERY NIGHT AT BEDTIME   No facility-administered encounter medications on file as of 08/29/2017.      Review of Systems  Constitutional:   No  weight loss, night sweats,  Fevers, chills,  +fatigue, or  lassitude.  HEENT:   No headaches,  Difficulty swallowing,  Tooth/dental problems, or  Sore throat,                No sneezing, itching, ear ache, nasal congestion, post nasal drip,   CV:  No chest pain,  Orthopnea, PND, swelling in lower extremities, anasarca, dizziness, palpitations, syncope.   GI  No heartburn, indigestion, abdominal pain, nausea, vomiting, diarrhea, change in bowel habits, loss of appetite, bloody stools.   Resp:   No wheezing.  No chest wall deformity  Skin: no rash or lesions.  GU: no dysuria, change in color of urine, no urgency or frequency.  No flank pain, no hematuria   MS:  No joint pain or swelling.  No decreased range of motion.  No back pain.    Physical Exam  BP 126/74 (BP Location: Left Arm, Cuff Size: Large)   Pulse 98   Ht 5\' 3"  (1.6 m)   Wt 233 lb (105.7 kg)   SpO2 97%   BMI 41.27 kg/m   GEN: A/Ox3; pleasant , NAD, elderly , on O2    HEENT:  [place]/AT,  EACs-clear, TMs-wnl, NOSE-clear, THROAT-clear, no lesions, no postnasal drip or exudate noted.   NECK:  Supple w/ fair ROM; no JVD; normal carotid impulses w/o bruits; no thyromegaly or nodules palpated; no lymphadenopathy.    RESP  Trace rhonchi , no accessory muscle use, no dullness to percussion  CARD:  RRR, no m/r/g, tr  peripheral  edema, pulses intact, no cyanosis or clubbing.  GI:   Soft & nt; nml bowel sounds; no organomegaly or masses detected.   Musco: Warm bil, no deformities or joint swelling noted.   Neuro: alert, no focal deficits noted.    Skin: Warm, no lesions or rashes    Lab Results:  CBC   BMET  BNP  ProBNP No results found for: PROBNP  Imaging: No results found.   Assessment & Plan:   COPD (chronic obstructive pulmonary disease) ([place]) Controlled on rx   Plan  Patient Instructions  Continue on Oxygen 2l/m rest and 4l/m activity .  Continue on BREO daily , rinse after use.  Continue on Spiriva daily . Rinse after use.  Continue on CPAP At bedtime  With oxgyen .  Do not drive if sleepy  Work on healthy weight .  Follow up Dr. Elsworth Soho  In 3-4 months and. As needed        Chronic respiratory failure ([facility]) Controlled on O2   Plan  . Patient Instructions  Continue on Oxygen 2l/m rest and 4l/m activity .  Continue on BREO daily , rinse after use.  Continue on Spiriva daily . Rinse after use.  Continue on CPAP At bedtime  With oxgyen .  Do not drive if sleepy  Work on healthy weight .  Follow up Dr. Elsworth Soho  In 3-4 months and. As needed        OSA (obstructive sleep apnea) Cont on CPAP At bedtime  With Oxygen  Download requested.   Plan  Patient Instructions  Continue on Oxygen 2l/m rest and 4l/m activity .  Continue on BREO daily , rinse after use.  Continue on Spiriva daily . Rinse after use.  Continue on CPAP At bedtime  With oxgyen .  Do not drive if sleepy  Work on healthy weight .  Follow up Dr. Elsworth Soho  In 3-4 months and. As needed           Rexene Edison, NP 08/29/2017

## 2017-08-29 NOTE — Patient Instructions (Signed)
Continue on Oxygen 2l/m rest and 4l/m activity .  Continue on BREO daily , rinse after use.  Continue on Spiriva daily . Rinse after use.  Continue on CPAP At bedtime  With oxgyen .  Do not drive if sleepy  Work on healthy weight .  Follow up Dr. Elsworth Soho  In 3-4 months and. As needed

## 2017-08-29 NOTE — Assessment & Plan Note (Signed)
Controlled on O2   Plan  . Patient Instructions  Continue on Oxygen 2l/m rest and 4l/m activity .  Continue on BREO daily , rinse after use.  Continue on Spiriva daily . Rinse after use.  Continue on CPAP At bedtime  With oxgyen .  Do not drive if sleepy  Work on healthy weight .  Follow up Dr. Elsworth Soho  In 3-4 months and. As needed

## 2017-08-29 NOTE — Assessment & Plan Note (Signed)
Cont on CPAP At bedtime  With Oxygen  Download requested.   Plan  Patient Instructions  Continue on Oxygen 2l/m rest and 4l/m activity .  Continue on BREO daily , rinse after use.  Continue on Spiriva daily . Rinse after use.  Continue on CPAP At bedtime  With oxgyen .  Do not drive if sleepy  Work on healthy weight .  Follow up Dr. Elsworth Soho  In 3-4 months and. As needed

## 2017-09-11 NOTE — Addendum Note (Signed)
Addended by: Parke Poisson E on: 09/11/2017 08:35 AM   Modules accepted: Orders

## 2017-10-08 ENCOUNTER — Encounter: Payer: Self-pay | Admitting: Family Medicine

## 2017-10-10 ENCOUNTER — Ambulatory Visit: Payer: Self-pay | Admitting: Family Medicine

## 2017-11-03 ENCOUNTER — Other Ambulatory Visit: Payer: Self-pay | Admitting: Family Medicine

## 2017-11-05 ENCOUNTER — Ambulatory Visit: Payer: Self-pay | Admitting: Internal Medicine

## 2017-11-12 ENCOUNTER — Ambulatory Visit: Payer: 59 | Admitting: Family Medicine

## 2017-11-12 VITALS — BP 132/82 | HR 97 | Temp 98.0°F | Ht 63.0 in | Wt 230.6 lb

## 2017-11-12 DIAGNOSIS — J449 Chronic obstructive pulmonary disease, unspecified: Secondary | ICD-10-CM

## 2017-11-12 DIAGNOSIS — Z23 Encounter for immunization: Secondary | ICD-10-CM

## 2017-11-12 DIAGNOSIS — I10 Essential (primary) hypertension: Secondary | ICD-10-CM | POA: Diagnosis not present

## 2017-11-12 MED ORDER — MONTELUKAST SODIUM 10 MG PO TABS: ORAL_TABLET | ORAL | 1 refills | Status: DC

## 2017-11-12 MED ORDER — POTASSIUM CHLORIDE CRYS ER 20 MEQ PO TBCR: EXTENDED_RELEASE_TABLET | ORAL | 1 refills | Status: DC

## 2017-11-12 MED ORDER — DILTIAZEM HCL ER COATED BEADS 120 MG PO CP24: 120.0000 mg | ORAL_CAPSULE | Freq: Every day | ORAL | 1 refills | Status: DC

## 2017-11-12 NOTE — Patient Instructions (Signed)
Great to see you. I will call you with your lab results from today and you can view them online.   

## 2017-11-12 NOTE — Assessment & Plan Note (Signed)
Followed by pulmonary.  Continue inhalers as directed. I did advise that she continue Singulair and discussed the rebound nasal constriction that is caused by Afrin.  Suggested she use flonase or other inhaled steroid instead. The patient indicates understanding of these issues and agrees with the plan.

## 2017-11-12 NOTE — Progress Notes (Signed)
Subjective:   Patient ID: Deborah [name], female    DOB: August 30, 1955, 62 y.o.   MRN: [id number]  Deborah [name] is a pleasant 62 y.o. year old female who presents to clinic today with Medication Refill (fasting, needs meds refilled. Tdap yes. She would like to know what she can take either to replace the Singulair or in addition to because it is not working like it used to.)  on 11/12/2017  HPI: COPD- has been using her inhalers as directed.  Has been wheezing more and more congested for months- feels it is her allergies.  She does not feel Singulair is working as well over the past couple of months. Does admit to using afrin nightly so she can use her CPAP and then feels very congested throughout the day.   Lab Results  Component Value Date   CREATININE 0.84 01/18/2017    Current Outpatient Medications on File Prior to Visit  Medication Sig Dispense Refill  . acetaminophen (TYLENOL) 500 MG tablet Take 1,500 mg by mouth 2 (two) times daily as needed for moderate pain or headache.    . albuterol (PROVENTIL) (2.5 MG/3ML) 0.083% nebulizer solution USE 1 VIAL VIA NEBULIZER EVERY 3 HOURS AS NEEDED FOR WHEEZING OR SHORTNESS OF BREATH 1125 mL 1  . aspirin 81 MG chewable tablet Chew 1 tablet (81 mg total) by mouth daily. 30 tablet 1  . BREO ELLIPTA 100-25 MCG/INH AEPB INHALE 1 PUFF BY MOUTH INTO THE LUNGS DAILY 1 each 5  . furosemide (LASIX) 20 MG tablet TAKE 2 TABLETS BY MOUTH EVERY MORNING AND 1 TABLET BY MOUTH EVERY EVENING AT 3 PM 270 tablet 0  . ipratropium-albuterol (DUONEB) 0.5-2.5 (3) MG/3ML SOLN USE 1 VIAL VIA NEBULIZER EVERY 6 HOURS AS NEEDED 360 mL 2  . OXYGEN Inhale 2-4 L/min into the lungs continuous.    Marland Kitchen Spacer/Aero-Holding Chambers (AEROCHAMBER MV) inhaler Use as instructed 1 each 0  . Tiotropium Bromide Monohydrate (SPIRIVA RESPIMAT) 2.5 MCG/ACT AERS Inhale 2 puffs into the lungs daily. 1 Inhaler 3  . VENTOLIN HFA 108 (90 Base) MCG/ACT inhaler INHALE 2 PUFFS BY MOUTH EVERY 6  HOURS AS NEEDED FOR WHEEZING OR SHORTNESS OF BREATH 18 g 5  . vitamin B-12 (CYANOCOBALAMIN) 500 MCG tablet Take 500 mcg by mouth daily.     No current facility-administered medications on file prior to visit.     No Known Allergies  Past Medical History:  Diagnosis Date  . Anemia    as teen  . Asthma   . CHF (congestive heart failure) ([place])   . COPD, mild ([place])   . Depression   . Diverticulitis   . Family history of anesthesia complication    vomiting  . GI bleeding   . Heart failure ([place])    New onset 07/25/14  . Histoplasmosis    left eye  . Hyperkalemia   . Hypertension   . Obesity (BMI 30-39.9)   . Pneumonia    dx wtih pneumonia on 05/27/16- seen by [facility]   . PONV (postoperative nausea and vomiting)   . Restrictive lung disease   . Shortness of breath    with exertion   . Sleep apnea    mask and oxygen at nite for sleep at 2L   . Tobacco abuse   . Umbilical hernia     Past Surgical History:  Procedure Laterality Date  . APPLICATION OF WOUND VAC  03/19/2013   Procedure: APPLICATION OF WOUND VAC;  Surgeon: Aaron Edelman  Layton, DO;  Location: [facility];  Service: General;;  . CESAREAN SECTION  04/12/85  . COLONOSCOPY WITH PROPOFOL N/A 06/13/2016   Procedure: COLONOSCOPY WITH PROPOFOL;  Surgeon: Jerene Bears, MD;  Location: [facility];  Service: Gastroenterology;  Laterality: N/A;  . ERCP N/A 02/15/2017   Procedure: ENDOSCOPIC RETROGRADE CHOLANGIOPANCREATOGRAPHY (ERCP);  Surgeon: [name], MD;  Location: [facility];  Service: Endoscopy;  Laterality: N/A;  . INSERTION OF MESH N/A 03/19/2013   Procedure: INSERTION OF MESH;  Surgeon: Madilyn Hook, DO;  Location: [facility];  Service: General;  Laterality: N/A;  . VENTRAL HERNIA REPAIR N/A 03/19/2013   Procedure:  OPEN VENTRAL HERNIA REPAIR WITH MESH AND APPLICATION OF WOUND VAC;  Surgeon: Madilyn Hook, DO;  Location: [facility];  Service: General;  Laterality: N/A;    Family History  Problem Relation Age of Onset  . Heart  attack Mother   . Emphysema Mother        was a smoker  . Lymphoma Maternal Uncle   . Pancreatic cancer Maternal Aunt   . Emphysema Unknown        Mother  . Heart failure Unknown        Father  . Cancer Unknown        Unknown  . Colon cancer Neg Hx   . Breast cancer Neg Hx     Social History   Socioeconomic History  . Marital status: Married    Spouse name: Not on file  . Number of children: 1  . Years of education: Not on file  . Highest education level: Not on file  Occupational History  . Occupation: Retired    Fish farm manager: NOT EMPLOYED  Social Needs  . Financial resource strain: Not on file  . Food insecurity:    Worry: Not on file    Inability: Not on file  . Transportation needs:    Medical: Not on file    Non-medical: Not on file  Tobacco Use  . Smoking status: Former Smoker    Packs/day: 3.00    Years: 43.00    Pack years: 129.00    Types: Cigarettes    Last attempt to quit: 12/17/2012    Years since quitting: 4.9  . Smokeless tobacco: Never Used  Substance and Sexual Activity  . Alcohol use: No    Alcohol/week: 0.0 oz  . Drug use: No  . Sexual activity: Not on file  Lifestyle  . Physical activity:    Days per week: Not on file    Minutes per session: Not on file  . Stress: Not on file  Relationships  . Social connections:    Talks on phone: Not on file    Gets together: Not on file    Attends religious service: Not on file    Active member of club or organization: Not on file    Attends meetings of clubs or organizations: Not on file    Relationship status: Not on file  . Intimate partner violence:    Fear of current or ex partner: Not on file    Emotionally abused: Not on file    Physically abused: Not on file    Forced sexual activity: Not on file  Other Topics Concern  . Not on file  Social History Narrative   ** Merged History Encounter **       ** Data from: 07/25/14 Enc Dept: [facility]       ** Data from: 06/05/13 Enc Dept:  [facility]   Married, one son 28 yo.            The PMH, PSH, Social History, Family History, Medications, and allergies have been reviewed in [facility], and have been updated if relevant.  Review of Systems  Constitutional: Negative for fever.  HENT: Positive for congestion. Negative for dental problem, drooling, ear discharge, ear pain, facial swelling, hearing loss, mouth sores, nosebleeds, postnasal drip, rhinorrhea, sinus pressure, sinus pain, sneezing, sore throat, tinnitus, trouble swallowing and voice change.   Eyes: Negative.   Respiratory: Positive for wheezing. Negative for apnea, cough, choking, chest tightness, shortness of breath and stridor.   Musculoskeletal: Negative.   Skin: Negative.   Neurological: Negative.   Hematological: Negative.   All other systems reviewed and are negative.      Objective:    BP 132/82 (BP Location: Left Arm, Patient Position: Sitting, Cuff Size: Normal)   Pulse 97   Temp 98 F (36.7 C) (Oral)   Ht 5\' 3"  (1.6 m)   Wt 230 lb 9.6 oz (104.6 kg)   SpO2 92%   BMI 40.85 kg/m    Physical Exam  Constitutional: She is oriented to person, place, and time. She appears well-developed and well-nourished. No distress.  HENT:  Head: Normocephalic and atraumatic.  Nose: Mucosal edema present. Right sinus exhibits no maxillary sinus tenderness and no frontal sinus tenderness. Left sinus exhibits no maxillary sinus tenderness and no frontal sinus tenderness.  Cardiovascular: Normal rate.  Pulmonary/Chest: No respiratory distress. She has wheezes.  O2 per [place]  Musculoskeletal: Normal range of motion.  Neurological: She is alert and oriented to person, place, and time.  Skin: Skin is warm. She is not diaphoretic.  Psychiatric: She has a normal mood and affect. Her behavior is normal. Judgment and thought content normal.  Nursing note and vitals reviewed.         Assessment & Plan:   Chronic obstructive pulmonary disease, unspecified  COPD type ([place])  Essential hypertension No follow-ups on file.

## 2017-11-13 ENCOUNTER — Telehealth: Payer: Self-pay | Admitting: Cardiovascular Disease

## 2017-11-13 LAB — COMPREHENSIVE METABOLIC PANEL
ALT: 42 U/L — ABNORMAL HIGH (ref 0–35)
AST: 34 U/L (ref 0–37)
Albumin: 4 g/dL (ref 3.5–5.2)
Alkaline Phosphatase: 67 U/L (ref 39–117)
BUN: 20 mg/dL (ref 6–23)
CO2: 28 mEq/L (ref 19–32)
Calcium: 9.2 mg/dL (ref 8.4–10.5)
Chloride: 100 mEq/L (ref 96–112)
Creatinine, Ser: 0.8 mg/dL (ref 0.40–1.20)
GFR: 77.91 mL/min (ref >60.00)
Glucose, Bld: 84 mg/dL (ref 70–99)
Potassium: 4.1 mEq/L (ref 3.5–5.1)
Sodium: 140 mEq/L (ref 135–145)
Total Bilirubin: 0.3 mg/dL (ref 0.2–1.2)
Total Protein: 7.3 g/dL (ref 6.0–8.3)

## 2017-11-13 NOTE — Telephone Encounter (Signed)
Dr. [name] just sent this rx in yesterday, so she should not need refill.

## 2017-11-13 NOTE — Telephone Encounter (Signed)
Please advise if ok to refill Diltiazem 120 mg tablet.

## 2017-11-13 NOTE — Telephone Encounter (Signed)
°*  STAT* If patient is at the pharmacy, call can be transferred to refill team.   1. Which medications need to be refilled? (please list name of each medication and dose if known) diltiazem (CARTIA XT) 120 MG - 1 capsule daily  2. Which pharmacy/location (including street and city if local pharmacy) is medication to be sent to? [facility] on [address]  3. Do they need a 30 day or 90 day supply? 90 day

## 2017-11-27 ENCOUNTER — Encounter: Payer: Self-pay | Admitting: Pulmonary Disease

## 2017-11-27 ENCOUNTER — Ambulatory Visit: Payer: 59 | Admitting: Pulmonary Disease

## 2017-11-27 ENCOUNTER — Encounter: Payer: Self-pay | Admitting: Cardiovascular Disease

## 2017-11-27 ENCOUNTER — Ambulatory Visit: Payer: 59 | Admitting: Cardiovascular Disease

## 2017-11-27 VITALS — BP 112/86 | HR 105 | Ht 63.0 in | Wt 231.5 lb

## 2017-11-27 DIAGNOSIS — I5032 Chronic diastolic (congestive) heart failure: Secondary | ICD-10-CM

## 2017-11-27 DIAGNOSIS — J449 Chronic obstructive pulmonary disease, unspecified: Secondary | ICD-10-CM | POA: Diagnosis not present

## 2017-11-27 DIAGNOSIS — J9611 Chronic respiratory failure with hypoxia: Secondary | ICD-10-CM

## 2017-11-27 DIAGNOSIS — I10 Essential (primary) hypertension: Secondary | ICD-10-CM | POA: Diagnosis not present

## 2017-11-27 DIAGNOSIS — G4733 Obstructive sleep apnea (adult) (pediatric): Secondary | ICD-10-CM

## 2017-11-27 MED ORDER — FLUTICASONE-UMECLIDIN-VILANT 100-62.5-25 MCG/INH IN AEPB: 1.0000 | INHALATION_SPRAY | Freq: Every day | RESPIRATORY_TRACT | 0 refills | Status: DC

## 2017-11-27 NOTE — Progress Notes (Signed)
   Subjective:    Patient ID: Deborah [name], female    DOB: 15-Jul-1956, 62 y.o.   MRN: [id number]  HPI  78 yof for FU of  mild COPD, OSA   She smoked up to 3 packs per day for 43 years before quitting in 2015. She is compliant with oxygen  She was started on Spiriva last visit and this seems to have helped.  She is compliant with Breo.  She has a coupon now for trilogy and would like to try that. Breathing is at baseline.  She is nasal congestion which she attributes to pollen, compliant with oxygen, does have a humidifier set up at home.  Due to this congestion she has stopped using her CPAP machine.  She elected not to go through her cholecystectomy, bloating does bother her some but pain is bearable     SIGNIFICANT EVENTS / STUDIES:   adm 07/2013 with hypercarbic, hypoxic resp failure due to volume overload from previously undx CHF.        12/2015 adm for hypercarbic resp failure,Improved with BiPAP  05/2013 PFTs- normal ratio, moderate restriction, FVC 54%, FEV1 56%, small airways with significant bronchodilator response, DLCO 60%  08/2014 PSG - 216 lbs -AHI 7/h, lowest desatn 82%       12/2016 PFT FEV1 at 59%, ratio 80, FVC 58%, positive bronchodilator response, DLCO 64%  Review of Systems neg for any significant sore throat, dysphagia, itching, sneezing,  excess/ purulent secretions, fever, chills, sweats, unintended wt loss, pleuritic or exertional cp, hempoptysis, orthopnea pnd or change in chronic leg swelling.   Also denies presyncope, palpitations, heartburn, abdominal pain, nausea, vomiting, diarrhea or change in bowel or urinary habits, dysuria,hematuria, rash, arthralgias, visual complaints, headache, numbness weakness or ataxia.     Objective:   Physical Exam Gen. Pleasant, obese, in no distress ENT - no thrush, on , no post nasal drip Neck: No JVD, no thyromegaly, no carotid bruits Lungs: no use of accessory muscles, no dullness to percussion,  decreased without rales or rhonchi  Cardiovascular: Rhythm regular, heart sounds  normal, no murmurs or gallops, no peripheral edema Musculoskeletal: No deformities, no cyanosis or clubbing , no tremors        Assessment & Plan:

## 2017-11-27 NOTE — Addendum Note (Signed)
Addended by: Valerie Salts on: 11/27/2017 02:30 PM   Modules accepted: Orders

## 2017-11-27 NOTE — Patient Instructions (Signed)
Medication Instructions: Continue same medications.   Labwork: None.   Procedures/Testing: None.   Follow-Up: 6 months with Dr. Akaisha Truman.   Any Additional Special Instructions Will Be Listed Below (If Applicable).     If you need a refill on your cardiac medications before your next appointment, please call your pharmacy.   

## 2017-11-27 NOTE — Assessment & Plan Note (Signed)
Ct O2 

## 2017-11-27 NOTE — Assessment & Plan Note (Signed)
Trial of Trelegy instead of Breo and Spiriva -let us know if this works.

## 2017-11-27 NOTE — Patient Instructions (Signed)
Trial of Trelegy instead of Breo and Spiriva -let us know if this works.  Get back on CPAP once nasal congestion gets better

## 2017-11-27 NOTE — Assessment & Plan Note (Signed)
Get back on CPAP once nasal congestion gets better  Weight loss encouraged, compliance with goal of at least 4-6 hrs every night is the expectation. Advised against medications with sedative side effects Cautioned against driving when sleepy - understanding that sleepiness will vary on a day to day basis

## 2017-11-27 NOTE — Progress Notes (Signed)
Cardiology Office Note   Date:  11/27/2017   ID:  Deborah [name], DOB [birth year], MRN [id number]  PCP:  [name], MD  Cardiologist:   Kathlyn Sacramento, MD   No chief complaint on file.     History of Present Illness: Deborah [name] is a 62 y.o. female who presents for a follow-up visit regarding chronic diastolic heart failure and chronic right heart failure due to COPD. She has known history of previous tobacco use with COPD, hypertension, Obesity and bipolar disorder. She was hospitalized in January, 2016 for CHF.  She had moderate size left pleural effusion. Echocardiogram showed an ejection fraction of 33-82%, grade 1 diastolic dysfunction, dilated right ventricle with hypokinesis and mild pulmonary hypertension.    She quit smoking completely since then .VQ scan was low probability for pulmonary embolism.  Volume overload was controlled with furosemide.  She has been doing reasonably well and reports stable symptoms.  She has occasional right-sided chest pain that lasts for few seconds with tenderness to touch.  Shortness of breath is stable.  No significant leg edema.  She continues to take furosemide 40 mg in the morning and 20 mg in the afternoon.  Past Medical History:  Diagnosis Date  . Anemia    as teen  . Asthma   . CHF (congestive heart failure) ([place])   . COPD, mild ([place])   . Depression   . Diverticulitis   . Family history of anesthesia complication    vomiting  . GI bleeding   . Heart failure ([place])    New onset 07/25/14  . Histoplasmosis    left eye  . Hyperkalemia   . Hypertension   . Obesity (BMI 30-39.9)   . Pneumonia    dx wtih pneumonia on 05/27/16- seen by [facility]   . PONV (postoperative nausea and vomiting)   . Restrictive lung disease   . Shortness of breath    with exertion   . Sleep apnea    mask and oxygen at nite for sleep at 2L   . Tobacco abuse   . Umbilical hernia     Past Surgical History:  Procedure Laterality Date  .  APPLICATION OF WOUND VAC  03/19/2013   Procedure: APPLICATION OF WOUND VAC;  Surgeon: Madilyn Hook, DO;  Location: [facility];  Service: General;;  . CESAREAN SECTION  04/12/85  . COLONOSCOPY WITH PROPOFOL N/A 06/13/2016   Procedure: COLONOSCOPY WITH PROPOFOL;  Surgeon: Jerene Bears, MD;  Location: [facility];  Service: Gastroenterology;  Laterality: N/A;  . ERCP N/A 02/15/2017   Procedure: ENDOSCOPIC RETROGRADE CHOLANGIOPANCREATOGRAPHY (ERCP);  Surgeon: [name], MD;  Location: [facility];  Service: Endoscopy;  Laterality: N/A;  . INSERTION OF MESH N/A 03/19/2013   Procedure: INSERTION OF MESH;  Surgeon: Madilyn Hook, DO;  Location: [facility];  Service: General;  Laterality: N/A;  . VENTRAL HERNIA REPAIR N/A 03/19/2013   Procedure:  OPEN VENTRAL HERNIA REPAIR WITH MESH AND APPLICATION OF WOUND VAC;  Surgeon: Madilyn Hook, DO;  Location: [facility];  Service: General;  Laterality: N/A;     Current Outpatient Medications  Medication Sig Dispense Refill  . acetaminophen (TYLENOL) 500 MG tablet Take 1,500 mg by mouth 2 (two) times daily as needed for moderate pain or headache.    . albuterol (PROVENTIL) (2.5 MG/3ML) 0.083% nebulizer solution USE 1 VIAL VIA NEBULIZER EVERY 3 HOURS AS NEEDED FOR WHEEZING OR SHORTNESS OF BREATH 1125 mL 1  . aspirin 81  MG chewable tablet Chew 1 tablet (81 mg total) by mouth daily. 30 tablet 1  . diltiazem (CARTIA XT) 120 MG 24 hr capsule Take 1 capsule (120 mg total) by mouth daily. 90 capsule 1  . Fluticasone-Umeclidin-Vilant (TRELEGY ELLIPTA) 100-62.5-25 MCG/INH AEPB Inhale 1 puff into the lungs daily. 1 each 0  . furosemide (LASIX) 20 MG tablet TAKE 2 TABLETS BY MOUTH EVERY MORNING AND 1 TABLET BY MOUTH EVERY EVENING AT 3 PM 270 tablet 0  . ipratropium-albuterol (DUONEB) 0.5-2.5 (3) MG/3ML SOLN USE 1 VIAL VIA NEBULIZER EVERY 6 HOURS AS NEEDED 360 mL 2  . montelukast (SINGULAIR) 10 MG tablet TAKE 1 TABLET(10 MG) BY MOUTH AT BEDTIME 90 tablet 1  . OXYGEN Inhale 2-4 L/min  into the lungs continuous.    . potassium chloride SA (K-DUR,KLOR-CON) 20 MEQ tablet TAKE 1/2 TABLET(10 MEQ) BY MOUTH DAILY 45 tablet 1  . Spacer/Aero-Holding Chambers (AEROCHAMBER MV) inhaler Use as instructed 1 each 0  . VENTOLIN HFA 108 (90 Base) MCG/ACT inhaler INHALE 2 PUFFS BY MOUTH EVERY 6 HOURS AS NEEDED FOR WHEEZING OR SHORTNESS OF BREATH 18 g 5  . vitamin B-12 (CYANOCOBALAMIN) 500 MCG tablet Take 500 mcg by mouth daily.     No current facility-administered medications for this visit.     Allergies:   Patient has no known allergies.    Social History:  The patient  reports that she quit smoking about 4 years ago. Her smoking use included cigarettes. She has a 129.00 pack-year smoking history. She has never used smokeless tobacco. She reports that she does not drink alcohol or use drugs.   Family History:  The patient's family history includes Cancer in her unknown relative; Emphysema in her mother and unknown relative; Heart attack in her mother; Heart failure in her unknown relative; Lymphoma in her maternal uncle; Pancreatic cancer in her maternal aunt.    ROS:  Please see the history of present illness.   Otherwise, review of systems are positive for none.   All other systems are reviewed and negative.    PHYSICAL EXAM: VS:  BP 112/86   Pulse (!) 105   Ht 5\' 3"  (1.6 m)   Wt 231 lb 8 oz (105 kg)   SpO2 97%   BMI 41.01 kg/m  , BMI Body mass index is 41.01 kg/m. GEN: Well nourished, well developed, in no acute distress  HEENT: normal  Neck: no JVD, carotid bruits, or masses Cardiac: RRR; no murmurs, rubs, or gallops,no edema  Respiratory:  clear to auscultation bilaterally with diminished breath sounds, normal work of breathing GI: soft, nontender, nondistended, + BS MS: no deformity or atrophy  Skin: warm and dry, no rash Neuro:  Strength and sensation are intact Psych: euthymic mood, full affect   EKG:  EKG is ordered today. The ekg ordered today demonstrates  normal sinus rhythm with low voltage.   Recent Labs: 01/30/2017: Hemoglobin 13.7; Platelets 289.0 11/12/2017: ALT 42; BUN 20; Creatinine, Ser 0.80; Potassium 4.1; Sodium 140    Lipid Panel    Component Value Date/Time   CHOL 200 03/29/2015 1216   TRIG 98.0 03/29/2015 1216   HDL 44.60 03/29/2015 1216   CHOLHDL 4 03/29/2015 1216   VLDL 19.6 03/29/2015 1216   LDLCALC 136 (H) 03/29/2015 1216      Wt Readings from Last 3 Encounters:  11/27/17 231 lb 8 oz (105 kg)  11/27/17 230 lb (104.3 kg)  11/12/17 230 lb 9.6 oz (104.6 kg)  ASSESSMENT AND PLAN:  1.  Chronic diastolic heart failure: She appears to be euvolemic on current dose of furosemide.   I reviewed recent labs from 2 weeks ago which showed normal renal function and potassium.  2. Essential hypertension: Blood pressure  is controlled on diltiazem.  Disposition:   FU with me in 6 months  Signed,  Kathlyn Sacramento, MD  11/27/2017 5:46 PM    [facility]

## 2017-12-17 ENCOUNTER — Telehealth: Payer: Self-pay | Admitting: Pulmonary Disease

## 2017-12-17 NOTE — Telephone Encounter (Signed)
Called and spoke with patient, per RA last OV note, if the Trelegy worked for her then we could send in a prescription to the pharmacy.   Advised patient that the prescription has been sent in to verified pharmacy. Nothing further needed.

## 2017-12-20 ENCOUNTER — Telehealth: Payer: Self-pay | Admitting: Pulmonary Disease

## 2017-12-20 MED ORDER — FLUTICASONE-UMECLIDIN-VILANT 100-62.5-25 MCG/INH IN AEPB: 1.0000 | INHALATION_SPRAY | Freq: Every day | RESPIRATORY_TRACT | 5 refills | Status: DC

## 2017-12-20 NOTE — Telephone Encounter (Signed)
Called and spoke to pt.  Pt states [facility] did not received Rx for Trelegy. Rx for Trelegy has been sent to preferred pharmacy. Nothing further is needed.

## 2018-01-16 ENCOUNTER — Other Ambulatory Visit: Payer: Self-pay

## 2018-01-16 MED ORDER — FUROSEMIDE 20 MG PO TABS
ORAL_TABLET | ORAL | 0 refills | Status: DC
Start: 2018-01-16 — End: 2018-05-03

## 2018-04-03 ENCOUNTER — Encounter: Payer: Self-pay | Admitting: Pulmonary Disease

## 2018-04-03 ENCOUNTER — Ambulatory Visit (INDEPENDENT_AMBULATORY_CARE_PROVIDER_SITE_OTHER)
Admission: RE | Admit: 2018-04-03 | Discharge: 2018-04-03 | Disposition: A | Discharge status: Home / Self Care | Payer: 59 | Source: Ambulatory Visit | Attending: Pulmonary Disease | Admitting: Pulmonary Disease

## 2018-04-03 ENCOUNTER — Ambulatory Visit: Payer: 59 | Admitting: Pulmonary Disease

## 2018-04-03 VITALS — BP 128/74 | HR 99 | Ht 61.25 in | Wt 236.2 lb

## 2018-04-03 DIAGNOSIS — G4733 Obstructive sleep apnea (adult) (pediatric): Secondary | ICD-10-CM

## 2018-04-03 DIAGNOSIS — J449 Chronic obstructive pulmonary disease, unspecified: Secondary | ICD-10-CM

## 2018-04-03 DIAGNOSIS — Z23 Encounter for immunization: Secondary | ICD-10-CM

## 2018-04-03 NOTE — Progress Notes (Signed)
   Subjective:    Patient ID: [name], female    DOB: 1955/12/29, 62 y.o.   MRN: [id number]  HPI  58 yoffor FU ofmild COPD, OSA   She smoked up to 3 packs per day for 43 years before quitting in 2015. She is compliant with oxygen. She completed pulmonary rehab  Since her last visit in 5/29, she was switched from [place] and Spiriva to Trelegy to like it since its only once daily.  She has intermittent coughing productive of clear white sputum. No pedal edema or wheezing. For the last 3 days she has been feeling increased dyspnea  Chest x-ray from 06/2016 was reviewed which appears clear  She is compliant with her CPAP machine, denies any problems with mask or pressure      SIGNIFICANT EVENTS / STUDIES:   adm 07/2013 with hypercarbic, hypoxic resp failure due to volume overload from previously undx CHF.  12/2015 adm for hypercarbic resp failure,Improved with BiPAP  05/2013 PFTs- normal ratio, moderate restriction, FVC 54%, FEV1 56%, small airways with significant bronchodilator response, DLCO 60%  08/2014 PSG - 216 lbs -AHI 7/h, lowest desatn 82% 12/2016 PFT FEV1 at 59%, ratio 80, FVC 58%, positive bronchodilator response, DLCO 64%  Review of Systems neg for any significant sore throat, dysphagia, itching, sneezing, nasal congestion or excess/ purulent secretions, fever, chills, sweats, unintended wt loss, pleuritic or exertional cp, hempoptysis, orthopnea pnd or change in chronic leg swelling. Also denies presyncope, palpitations, heartburn, abdominal pain, nausea, vomiting, diarrhea or change in bowel or urinary habits, dysuria,hematuria, rash, arthralgias, visual complaints, headache, numbness weakness or ataxia.     Objective:   Physical Exam   Gen. Pleasant, obese, in no distress ENT - no lesions, no post nasal drip Neck: No JVD, no thyromegaly, no carotid bruits Lungs: no use of accessory muscles, no dullness to percussion, decreased  without rales or rhonchi  Cardiovascular: Rhythm regular, heart sounds  normal, no murmurs or gallops, no peripheral edema Musculoskeletal: No deformities, no cyanosis or clubbing , no tremors         Assessment & Plan:

## 2018-04-03 NOTE — Assessment & Plan Note (Signed)
Flu shot today. Continue Trelegy. She is compliant with oxygen and this is certainly helped

## 2018-04-03 NOTE — Patient Instructions (Signed)
Flu shot today. We discussed signs and symptoms of flareups including yellow-green phlegm or wheezing.

## 2018-04-03 NOTE — Assessment & Plan Note (Signed)
CPAP is certainly helped improve her daytime somnolence and fatigue  Weight loss encouraged, compliance with goal of at least 4-6 hrs every night is the expectation. Advised against medications with sedative side effects Cautioned against driving when sleepy - understanding that sleepiness will vary on a day to day basis

## 2018-04-04 ENCOUNTER — Telehealth: Payer: Self-pay | Admitting: *Deleted

## 2018-04-04 DIAGNOSIS — R911 Solitary pulmonary nodule: Secondary | ICD-10-CM

## 2018-04-04 NOTE — Telephone Encounter (Signed)
Notes recorded by Rigoberto Noel, MD on 04/04/2018 at 9:07 AM EDT There is a new masslike shadow in the left mid lung which was not present on prior chest x-ray 06/2016 Obtain CT chest with IV contrast for evaluation and office visit after with me  Left message for patient to call back for results. Per RA, CT needs to be scheduled ASAP.

## 2018-04-04 NOTE — Telephone Encounter (Signed)
Please see result note 

## 2018-04-04 NOTE — Telephone Encounter (Signed)
Valarie Merino from [facility] Radiology called report on a chest x-ray for Ms. Sivertsen stating that a new mass like opacity in posterior left mid lung 6.5cm in size with adjacent peripheral satelite nodules.  Primary bronciogenic carcinoma no excluded.    CT chest with IV contrast for further evaluation.   IMPRESSION: New masslike opacity in the posterior left mid lung measuring 6.5 cm with adjacent peripheral satellite nodule. Primary bronchogenic carcinoma not excluded. Chest CT with IV contrast recommended for further evaluation.  These results will be called to the ordering clinician or representative by the Radiologist Assistant, and communication documented in the PACS or zVision Dashboard.  Will forward to Dr. Elsworth Soho.

## 2018-04-05 NOTE — Telephone Encounter (Signed)
Called spoke with patient and have placed order.   RA you are booked for Ov until 04/26/18 please advise it f/u can be then or if we are okay to over book  You or should we schedule with np.

## 2018-04-08 ENCOUNTER — Telehealth: Payer: Self-pay | Admitting: Primary Care

## 2018-04-08 ENCOUNTER — Ambulatory Visit (INDEPENDENT_AMBULATORY_CARE_PROVIDER_SITE_OTHER)
Admission: RE | Admit: 2018-04-08 | Discharge: 2018-04-08 | Disposition: A | Discharge status: Home / Self Care | Payer: 59 | Source: Ambulatory Visit | Attending: Pulmonary Disease | Admitting: Pulmonary Disease

## 2018-04-08 DIAGNOSIS — R911 Solitary pulmonary nodule: Secondary | ICD-10-CM

## 2018-04-08 MED ORDER — IOPAMIDOL (ISOVUE-300) INJECTION 61%
80.0000 mL | Freq: Once | INTRAVENOUS | Status: AC | PRN
  Administered 2018-04-08: 80 mL via INTRAVENOUS

## 2018-04-08 NOTE — Telephone Encounter (Signed)
Please schedule follow-up appointment on Wednesday with NP to discuss CT results -We will likely need to schedule PET scan and bronchoscopy I can step in to discuss these with patient

## 2018-04-08 NOTE — Telephone Encounter (Signed)
[facility] Radiology called with a call report.  CT today 04/08/18  CLINICAL DATA:  Lung mass seen on chest radiograph  EXAM: CT CHEST WITH CONTRAST  TECHNIQUE: Multidetector CT imaging of the chest was performed during intravenous contrast administration.  CONTRAST:  19mL ISOVUE-300 IOPAMIDOL (ISOVUE-300) INJECTION 61%  COMPARISON:  Chest radiograph June 03, 2018  FINDINGS: Cardiovascular: There is no thoracic aortic aneurysm or dissection. No evident pulmonary embolus. Visualized great vessels appear unremarkable except for scattered calcifications in the proximal aspects of these vessels without hemodynamically significant obstruction. There is aortic atherosclerosis in the thoracic aorta. There are foci of coronary artery calcification. There is no pericardial effusion or pericardial thickening evident.  Mediastinum/Nodes: Visualized thyroid appears normal. There is adenopathy to the left of the carina measuring 2.6 x 2.2 cm. A second lymph node in this area measures 1.5 x 1.5 cm. There is apparent adenopathy in the left hilum which is difficult to distinguish from adjacent mass which extends into the left hilum. No esophageal lesions are evident.  Lungs/Pleura: There is a mass with cavitation arising in the superior segment of the left lower lobe with invasion of the left hilum. This mass measures 7.2 x 6.2 x 4.7 cm. There is patchy infiltrate in the periphery of the left lower lobe, likely due at least in part to a degree of postobstructive pneumonitis. There is mild underlying centrilobular emphysematous change. There is mild atelectatic change in the right middle lobe. No other parenchymal nodular lesions are evident.  Upper Abdomen: No adrenal lesions are evident. There is a degree of hepatic steatosis. There are calcified granulomas in the spleen. There is upper abdominal aortic atherosclerosis.  Musculoskeletal: There is degenerative change in  the thoracic spine. No blastic or lytic bone lesions are evident. There are no chest wall lesions.  IMPRESSION: 1. There is a cavitary mass on the left extending into the left hilum measuring 7.2 x 6.2 x 4.7 cm. This lesion appears to arise in the superior segment of the left lower lobe. There is patchy infiltrate in the periphery of the left lower lobe elsewhere, likely in part due to postobstructive pneumonitis. No other mass.  2. There is adenopathy adjacent to the left carina. There is felt to be adenopathy in the left hilar region which is difficult to delineate from the adjacent mass.  3. Aortic atherosclerosis. There are foci of great vessel and coronary artery calcification.  4.  There is mild underlying emphysematous change.  Comment: PET-CT could be helpful assessment and staging of this apparent lung carcinoma arising on the left side with extension into the left hilar region.  These results will be called to the ordering clinician or representative by the Radiologist Assistant, and communication documented in the PACS or zVision Dashboard.  Aortic Atherosclerosis (ICD10-I70.0) and Emphysema (ICD10-J43.9).   Electronically Signed   By: Lowella Grip III M.D.   On: 04/08/2018 10:49   Result History   CT Chest W Contrast (Order #[id number]) on 04/08/2018 - Order Result History Report  Encounter-Level Documents - 04/08/2018:   Electronic signature on 04/08/2018 10:05 AM - Signed      Order-Level Documents:   There are no order-level documents.  Orders Requiring a Screening Form   Procedure Order Status Form Status  CT Chest W Contrast Completed Completed  Vitals   Height Weight BMI (Calculated)  5' 1.25" (1.556 m) 236 lb 3.2 oz (107.1 kg) 44.25  Protocol Documents   Imaging Protocol  Imaging   Imaging Information  Resulted by:   Signed Date/Time  Phone Pager  [place] 04/08/2018 10:49 AM [phone]   Link to IR  Documentation Timeline   Sedation  Study Notes    Fonda Kinder on 04/08/2018 10:37 AM  New Lt mid lung mass seen on recent CXR done at routine visit to Pulmonologist. Hx of COPD On O2 since 2016. No new complaints. 80 ml Isovue 300 given>     Imaging Related Medications   Medication   iopamidol (ISOVUE-300) 61 % injection 80 mL (Completed)  Route: Intravenous  Admin Amount: 80 mL  Volume: 90 mL  PRN Reason(s): contrast  Last Admin Time: 04/08/18 1035  Number of Expected Doses: 1  Most Recent Administration:   User Action Time Recorded Time Dose Route Site Comment Action Reason  Fonda Kinder 04/08/18 1035 04/08/18 1035 80 mL Intravenous   Contrast Given   Full Administration Report            Original Order   Ordered On Ordered By   04/05/2018 4:40 PM Madolyn Frieze, LPN           External Result Report   External Result Report

## 2018-04-10 ENCOUNTER — Ambulatory Visit (INDEPENDENT_AMBULATORY_CARE_PROVIDER_SITE_OTHER): Payer: 59 | Admitting: Pulmonary Disease

## 2018-04-10 ENCOUNTER — Encounter: Payer: Self-pay | Admitting: Pulmonary Disease

## 2018-04-10 VITALS — BP 120/72 | HR 99 | Ht 61.25 in | Wt 237.0 lb

## 2018-04-10 DIAGNOSIS — J984 Other disorders of lung: Secondary | ICD-10-CM

## 2018-04-10 DIAGNOSIS — R911 Solitary pulmonary nodule: Secondary | ICD-10-CM | POA: Diagnosis not present

## 2018-04-10 DIAGNOSIS — J432 Centrilobular emphysema: Secondary | ICD-10-CM | POA: Diagnosis not present

## 2018-04-10 NOTE — Patient Instructions (Signed)
Schedule PET scan Bronchoscopy scheduled for Friday 10/4 at 7:45 AM at [facility]. Nothing to eat after midnight the night before. We discussed risks and benefits of procedure

## 2018-04-10 NOTE — Progress Notes (Signed)
   Subjective:    Patient ID: Deborah [name], female    DOB: Sep 03, 1955, 62 y.o.   MRN: [id number]  HPI  3yoffor FU ofmild COPD, OSA on CPAP  She smoked up to 3 packs per day for 43 years before quitting in 2015. She is compliant with oxygen. She completed pulmonary rehab  Since her last visit in 5/29, she was switched from [place] and Spiriva to Trelegy On her last visit 9/18, dyspnea is at baseline, reported mild chronic cough and we did a routine chest x-ray which showed a left lower lobe lung mass.   Chest x-ray from 06/2016 was reviewed which appears clear  CT chest 9/23 showed a cavitary mass measuring 7.2 x 6.2 x 4.7 cm from the superior segment of the left lower lobe extending into the left hilum with a patchy infiltrate in the periphery of the left lower lobe with some adenopathy in the left hilum  We reviewed these images and discussed implications of findings    SIGNIFICANT EVENTS / STUDIES:   adm 07/2013 with hypercarbic, hypoxic resp failure due to volume overload from previously undx CHF.  12/2015 adm for hypercarbic resp failure,Improved with BiPAP  05/2013 PFTs- normal ratio, moderate restriction, FVC 54%, FEV1 56%, small airways with significant bronchodilator response, DLCO 60%  08/2014 PSG - 216 lbs -AHI 7/h, lowest desatn 82% 12/2016 PFT FEV1 at 59%, ratio 80, FVC 58%, positive bronchodilator response, DLCO 64%  Review of Systems neg for any significant sore throat, dysphagia, itching, sneezing, nasal congestion or excess/ purulent secretions, fever, chills, sweats, unintended wt loss, pleuritic or exertional cp, hempoptysis, orthopnea pnd or change in chronic leg swelling. Also denies presyncope, palpitations, heartburn, abdominal pain, nausea, vomiting, diarrhea or change in bowel or urinary habits, dysuria,hematuria, rash, arthralgias, visual complaints, headache, numbness weakness or ataxia.     Objective:   Physical  Exam  Gen. Pleasant, obese, in no distress ENT - no lesions, no post nasal drip Neck: No JVD, no thyromegaly, no carotid bruits Lungs: no use of accessory muscles, no dullness to percussion, decreased without rales or rhonchi  Cardiovascular: Rhythm regular, heart sounds  normal, no murmurs or gallops, no peripheral edema Musculoskeletal: No deformities, no cyanosis or clubbing , no tremors        Assessment & Plan:

## 2018-04-10 NOTE — H&P (View-Only) (Signed)
   Subjective:    Patient ID: Deborah [name], female    DOB: 1956-03-21, 62 y.o.   MRN: [id number]  HPI  47yoffor FU ofmild COPD, OSA on CPAP  She smoked up to 3 packs per day for 43 years before quitting in 2015. She is compliant with oxygen. She completed pulmonary rehab  Since her last visit in 5/29, she was switched from [place] and Spiriva to Trelegy On her last visit 9/18, dyspnea is at baseline, reported mild chronic cough and we did a routine chest x-ray which showed a left lower lobe lung mass.   Chest x-ray from 06/2016 was reviewed which appears clear  CT chest 9/23 showed a cavitary mass measuring 7.2 x 6.2 x 4.7 cm from the superior segment of the left lower lobe extending into the left hilum with a patchy infiltrate in the periphery of the left lower lobe with some adenopathy in the left hilum  We reviewed these images and discussed implications of findings    SIGNIFICANT EVENTS / STUDIES:   adm 07/2013 with hypercarbic, hypoxic resp failure due to volume overload from previously undx CHF.  12/2015 adm for hypercarbic resp failure,Improved with BiPAP  05/2013 PFTs- normal ratio, moderate restriction, FVC 54%, FEV1 56%, small airways with significant bronchodilator response, DLCO 60%  08/2014 PSG - 216 lbs -AHI 7/h, lowest desatn 82% 12/2016 PFT FEV1 at 59%, ratio 80, FVC 58%, positive bronchodilator response, DLCO 64%  Review of Systems neg for any significant sore throat, dysphagia, itching, sneezing, nasal congestion or excess/ purulent secretions, fever, chills, sweats, unintended wt loss, pleuritic or exertional cp, hempoptysis, orthopnea pnd or change in chronic leg swelling. Also denies presyncope, palpitations, heartburn, abdominal pain, nausea, vomiting, diarrhea or change in bowel or urinary habits, dysuria,hematuria, rash, arthralgias, visual complaints, headache, numbness weakness or ataxia.     Objective:   Physical  Exam  Gen. Pleasant, obese, in no distress ENT - no lesions, no post nasal drip Neck: No JVD, no thyromegaly, no carotid bruits Lungs: no use of accessory muscles, no dullness to percussion, decreased without rales or rhonchi  Cardiovascular: Rhythm regular, heart sounds  normal, no murmurs or gallops, no peripheral edema Musculoskeletal: No deformities, no cyanosis or clubbing , no tremors        Assessment & Plan:

## 2018-04-18 ENCOUNTER — Encounter (HOSPITAL_COMMUNITY)
Admission: RE | Admit: 2018-04-18 | Discharge: 2018-04-18 | Disposition: A | Discharge status: Home / Self Care | Payer: 59 | Source: Ambulatory Visit | Attending: Pulmonary Disease | Admitting: Pulmonary Disease

## 2018-04-18 DIAGNOSIS — R911 Solitary pulmonary nodule: Secondary | ICD-10-CM | POA: Diagnosis present

## 2018-04-18 LAB — GLUCOSE, CAPILLARY: Glucose-Capillary: 112 mg/dL — ABNORMAL HIGH (ref 70–99)

## 2018-04-18 MED ORDER — FLUDEOXYGLUCOSE F - 18 (FDG) INJECTION
10.5000 | Freq: Once | INTRAVENOUS | Status: AC | PRN
  Administered 2018-04-18: 10.5 via INTRAVENOUS

## 2018-04-19 ENCOUNTER — Ambulatory Visit (HOSPITAL_COMMUNITY): Payer: 59

## 2018-04-19 ENCOUNTER — Ambulatory Visit (HOSPITAL_COMMUNITY)
Admission: RE | Admit: 2018-04-19 | Discharge: 2018-04-19 | Disposition: A | Discharge status: Home / Self Care | Payer: 59 | Source: Ambulatory Visit | Attending: Pulmonary Disease | Admitting: Pulmonary Disease

## 2018-04-19 ENCOUNTER — Encounter (HOSPITAL_COMMUNITY)
Admission: RE | Disposition: A | Discharge status: Home / Self Care | Payer: Self-pay | Source: Ambulatory Visit | Attending: Pulmonary Disease

## 2018-04-19 ENCOUNTER — Encounter (HOSPITAL_COMMUNITY): Payer: Self-pay | Admitting: Respiratory Therapy

## 2018-04-19 DIAGNOSIS — R918 Other nonspecific abnormal finding of lung field: Secondary | ICD-10-CM | POA: Diagnosis not present

## 2018-04-19 DIAGNOSIS — Z87891 Personal history of nicotine dependence: Secondary | ICD-10-CM | POA: Diagnosis not present

## 2018-04-19 DIAGNOSIS — G4733 Obstructive sleep apnea (adult) (pediatric): Secondary | ICD-10-CM | POA: Insufficient documentation

## 2018-04-19 DIAGNOSIS — J449 Chronic obstructive pulmonary disease, unspecified: Secondary | ICD-10-CM | POA: Insufficient documentation

## 2018-04-19 DIAGNOSIS — C3432 Malignant neoplasm of lower lobe, left bronchus or lung: Secondary | ICD-10-CM | POA: Diagnosis not present

## 2018-04-19 DIAGNOSIS — Z9981 Dependence on supplemental oxygen: Secondary | ICD-10-CM | POA: Insufficient documentation

## 2018-04-19 HISTORY — PX: VIDEO BRONCHOSCOPY: SHX5072

## 2018-04-19 SURGERY — BRONCHOSCOPY, WITH FLUOROSCOPY
Anesthesia: Moderate Sedation | Laterality: Bilateral

## 2018-04-19 MED ORDER — LIDOCAINE HCL 2 % EX GEL
1.0000 "application " | Freq: Once | CUTANEOUS | Status: DC
  Filled 2018-04-19: qty 5

## 2018-04-19 MED ORDER — SODIUM CHLORIDE 0.9 % IV SOLN
INTRAVENOUS | Status: DC
  Administered 2018-04-19: 08:00:00 via INTRAVENOUS

## 2018-04-19 MED ORDER — FENTANYL CITRATE (PF) 100 MCG/2ML IJ SOLN
INTRAMUSCULAR | Status: DC | PRN
  Administered 2018-04-19 (×2): 25 ug via INTRAVENOUS
  Administered 2018-04-19: 50 ug via INTRAVENOUS

## 2018-04-19 MED ORDER — MIDAZOLAM HCL 10 MG/2ML IJ SOLN
INTRAMUSCULAR | Status: DC | PRN
  Administered 2018-04-19: 1 mg via INTRAVENOUS

## 2018-04-19 MED ORDER — PHENYLEPHRINE HCL 0.25 % NA SOLN
NASAL | Status: DC | PRN
  Administered 2018-04-19: 2 via NASAL

## 2018-04-19 MED ORDER — LIDOCAINE HCL URETHRAL/MUCOSAL 2 % EX GEL
CUTANEOUS | Status: DC | PRN
  Administered 2018-04-19: 1

## 2018-04-19 MED ORDER — MIDAZOLAM HCL 5 MG/ML IJ SOLN
INTRAMUSCULAR | Status: AC
  Filled 2018-04-19: qty 2

## 2018-04-19 MED ORDER — LIDOCAINE HCL (PF) 1 % IJ SOLN
INTRAMUSCULAR | Status: DC | PRN
  Administered 2018-04-19: 6 mL

## 2018-04-19 MED ORDER — PHENYLEPHRINE HCL 0.25 % NA SOLN: 1.0000 | Freq: Four times a day (QID) | NASAL | Status: DC | PRN

## 2018-04-19 MED ORDER — FENTANYL CITRATE (PF) 100 MCG/2ML IJ SOLN
INTRAMUSCULAR | Status: AC
  Filled 2018-04-19: qty 4

## 2018-04-19 NOTE — Interval H&P Note (Signed)
History and Physical Interval Note:  04/19/2018 7:54 AM  Deborah [name]  has presented today for surgery, with the diagnosis of lung mass  The various methods of treatment have been discussed with the patient and family. After consideration of risks, benefits and other options for treatment, the patient has consented to  Procedure(s): VIDEO BRONCHOSCOPY WITH FLUORO (Bilateral) as a surgical intervention .  The patient's history has been reviewed, patient examined, no change in status, stable for surgery.  I have reviewed the patient's chart and labs.  Questions were answered to the patient's satisfaction.     Leanna Sato Elsworth Soho

## 2018-04-19 NOTE — Op Note (Signed)
Indication : LLL cavitary mass in this ex smoker with severe COPD & OSA on O2 Written informed consent was obtained prior to the procedure. The risks of the procedure including coughing, bleeding and the small chance of lung puncture requiring chest tube were discussed in great detail. The benefits & alternatives including serial follow up were also discussed.  1 mg versed & 100  mcg fentnayl used in divided doses during the procedure Bronchoscope entered from the right nare. Upper airway nml Vocal cords showed nml appearance & motion. Trachea & bronchial tree examined to the subsegmental level. Mild amount of white secretions were noted. No endobronchial lesions seen but orifice of segmental bronchus in LLL was seen to be narrowed Brushings x 2Trans bronchial biopsies x 4 were obtained from the LLL under fluoroscopy. BAL was also obtained from the LLL.  There was moderate coughing  during the procedure. BP was elevated during & after the procedure. She was hypoxic to 3L [place] before starting & required transient high flow O2 during th eprocedure  A CXR will be performed to r/o presence of pneumothorax.  ALVA,RAKESH V.  [phone]

## 2018-04-19 NOTE — Assessment & Plan Note (Signed)
We discussed implications of these findings and reviewed images in detail, all questions were answered Schedule PET scan She does seem to have left hilar adenopathy and ideally would need an EBUS procedure.  However I am concerned about the risk of general anesthesia.  We will proceed with bronchoscopy  Bronchoscopy scheduled for Friday 10/4 at 7:45 AM at [facility]. Nothing to eat after midnight the night before. We discussed risks and benefits of procedure

## 2018-04-19 NOTE — Discharge Instructions (Signed)
Flexible Bronchoscopy, Care After These instructions give you information on caring for yourself after your procedure. Your doctor may also give you more specific instructions. Call your doctor if you have any problems or questions after your procedure. Follow these instructions at home:  Do not eat or drink anything for 2 hours after your procedure. If you try to eat or drink before the medicine wears off, food or drink could go into your lungs. You could also burn yourself.  After 2 hours have passed and when you can cough and gag normally, you may eat soft food and drink liquids slowly.  The day after the test, you may eat your normal diet.  You may do your normal activities.  Keep all doctor visits. Get help right away if:  You get more and more short of breath.  You get light-headed.  You feel like you are going to pass out (faint).  You have chest pain.  You have new problems that worry you.  You cough up more than a little blood.  You cough up more blood than before. This information is not intended to replace advice given to you by your health care provider. Make sure you discuss any questions you have with your health care provider. Document Released: 04/30/2009 Document Revised: 12/09/2015 Document Reviewed: 03/07/2013 Elsevier Interactive Patient Education  2017 [place] not eat or drink until after 1015 today 04/19/18

## 2018-04-19 NOTE — Assessment & Plan Note (Signed)
Continue trilogy and oxygen

## 2018-04-19 NOTE — Progress Notes (Signed)
Video bronchoscopy performed Intervention bronchial washings Intervention bronchial brushings Intervention bronchial biopsies Patient tolerated well  Kathie Dike RRT

## 2018-04-20 LAB — ACID FAST SMEAR (AFB, MYCOBACTERIA): Acid Fast Smear: NEGATIVE

## 2018-04-21 LAB — CULTURE, BAL-QUANTITATIVE W GRAM STAIN: Culture: NO GROWTH

## 2018-04-21 LAB — CULTURE, BAL-QUANTITATIVE

## 2018-04-22 ENCOUNTER — Encounter (HOSPITAL_COMMUNITY): Payer: Self-pay | Admitting: Pulmonary Disease

## 2018-04-24 ENCOUNTER — Telehealth: Payer: Self-pay | Admitting: Pulmonary Disease

## 2018-04-24 NOTE — Telephone Encounter (Signed)
Called and spoke to pt. Pt is requesting name of cancer that she was dx with.  I relayed name to pt per below results.  Nothing further is needed.   Result Notes for Fungus Culture Result   Notes recorded by Rigoberto Noel, MD on 04/24/2018 at 2:42 PM EDT -Discussed biopsy results with the patient showing squamous cell cancer. Discussed that this is locally advanced with mediastinal lymph node involvement. Please place referral to Dr. Julien Nordmann oncology

## 2018-04-24 NOTE — Telephone Encounter (Signed)
lmtcb x1 for pt. 

## 2018-04-24 NOTE — Telephone Encounter (Signed)
Pt is returning call. Cb is [phone].

## 2018-04-25 ENCOUNTER — Other Ambulatory Visit: Payer: Self-pay

## 2018-04-25 ENCOUNTER — Telehealth: Payer: Self-pay | Admitting: *Deleted

## 2018-04-25 DIAGNOSIS — C3492 Malignant neoplasm of unspecified part of left bronchus or lung: Secondary | ICD-10-CM

## 2018-04-25 DIAGNOSIS — R918 Other nonspecific abnormal finding of lung field: Secondary | ICD-10-CM

## 2018-04-25 DIAGNOSIS — J984 Other disorders of lung: Secondary | ICD-10-CM

## 2018-04-25 NOTE — Telephone Encounter (Signed)
Oncology Nurse Navigator Documentation  Oncology Nurse Navigator Flowsheets 04/25/2018  Navigator Location [facility]  Referral date to RadOnc/MedOnc 04/25/2018  Navigator Encounter Type Telephone/I received referral today on Mr. Deborah [name].  I called and scheduled her to be seen at thoracic clinic next week.  She verbalized understanding of appt time and place.  Per Dr. Julien Nordmann, I requested PDL 1 on recent pathology.    Telephone Outgoing Call  Abnormal Finding Date 04/08/2018  Confirmed Diagnosis Date 04/19/2018  Treatment Phase Pre-Tx/Tx Discussion  Barriers/Navigation Needs Coordination of Care  Interventions Coordination of Care  Coordination of Care Appts  Acuity Level 2  Time Spent with Patient 30

## 2018-05-02 ENCOUNTER — Encounter: Payer: Self-pay | Admitting: *Deleted

## 2018-05-02 ENCOUNTER — Ambulatory Visit
Admission: RE | Admit: 2018-05-02 | Discharge: 2018-05-02 | Disposition: A | Discharge status: Home / Self Care | Payer: 59 | Source: Ambulatory Visit | Attending: Radiation Oncology | Admitting: Radiation Oncology

## 2018-05-02 ENCOUNTER — Inpatient Hospital Stay: Payer: 59

## 2018-05-02 ENCOUNTER — Inpatient Hospital Stay (HOSPITAL_BASED_OUTPATIENT_CLINIC_OR_DEPARTMENT_OTHER): Payer: 59 | Admitting: Internal Medicine

## 2018-05-02 ENCOUNTER — Telehealth: Payer: Self-pay | Admitting: Internal Medicine

## 2018-05-02 ENCOUNTER — Encounter: Payer: Self-pay | Admitting: Internal Medicine

## 2018-05-02 VITALS — BP 130/69 | HR 114 | Temp 98.1°F | Resp 16 | Ht 61.0 in | Wt 242.1 lb

## 2018-05-02 DIAGNOSIS — C3432 Malignant neoplasm of lower lobe, left bronchus or lung: Secondary | ICD-10-CM | POA: Insufficient documentation

## 2018-05-02 DIAGNOSIS — Z5111 Encounter for antineoplastic chemotherapy: Secondary | ICD-10-CM | POA: Insufficient documentation

## 2018-05-02 DIAGNOSIS — E669 Obesity, unspecified: Secondary | ICD-10-CM

## 2018-05-02 DIAGNOSIS — C3492 Malignant neoplasm of unspecified part of left bronchus or lung: Secondary | ICD-10-CM

## 2018-05-02 DIAGNOSIS — G4730 Sleep apnea, unspecified: Secondary | ICD-10-CM

## 2018-05-02 DIAGNOSIS — R918 Other nonspecific abnormal finding of lung field: Secondary | ICD-10-CM

## 2018-05-02 DIAGNOSIS — Z7982 Long term (current) use of aspirin: Secondary | ICD-10-CM

## 2018-05-02 DIAGNOSIS — I509 Heart failure, unspecified: Secondary | ICD-10-CM

## 2018-05-02 DIAGNOSIS — Z79899 Other long term (current) drug therapy: Secondary | ICD-10-CM

## 2018-05-02 DIAGNOSIS — C3490 Malignant neoplasm of unspecified part of unspecified bronchus or lung: Secondary | ICD-10-CM

## 2018-05-02 DIAGNOSIS — Z87891 Personal history of nicotine dependence: Secondary | ICD-10-CM

## 2018-05-02 DIAGNOSIS — I10 Essential (primary) hypertension: Secondary | ICD-10-CM | POA: Diagnosis not present

## 2018-05-02 DIAGNOSIS — Z510 Encounter for antineoplastic radiation therapy: Secondary | ICD-10-CM | POA: Diagnosis present

## 2018-05-02 DIAGNOSIS — Z7189 Other specified counseling: Secondary | ICD-10-CM | POA: Insufficient documentation

## 2018-05-02 HISTORY — DX: Malignant neoplasm of unspecified part of unspecified bronchus or lung: C34.90

## 2018-05-02 LAB — CBC WITH DIFFERENTIAL (CANCER CENTER ONLY)
Abs Immature Granulocytes: 0.02 10*3/uL (ref 0.00–0.07)
Basophils Absolute: 0 10*3/uL (ref 0.0–0.1)
Basophils Relative: 0 %
Eosinophils Absolute: 0.2 10*3/uL (ref 0.0–0.5)
Eosinophils Relative: 2 %
HCT: 44.8 % (ref 36.0–46.0)
Hemoglobin: 14.1 g/dL (ref 12.0–15.0)
Immature Granulocytes: 0 %
Lymphocytes Relative: 20 %
Lymphs Abs: 1.8 10*3/uL (ref 0.7–4.0)
MCH: 29.4 pg (ref 26.0–34.0)
MCHC: 31.5 g/dL (ref 30.0–36.0)
MCV: 93.3 fL (ref 80.0–100.0)
Monocytes Absolute: 0.8 10*3/uL (ref 0.1–1.0)
Monocytes Relative: 9 %
Neutro Abs: 6.2 10*3/uL (ref 1.7–7.7)
Neutrophils Relative %: 69 %
Platelet Count: 269 10*3/uL (ref 150–400)
RBC: 4.8 MIL/uL (ref 3.87–5.11)
RDW: 13.2 % (ref 11.5–15.5)
WBC Count: 8.9 10*3/uL (ref 4.0–10.5)
nRBC: 0 % (ref 0.0–0.2)

## 2018-05-02 LAB — CMP (CANCER CENTER ONLY)
ALT: 14 U/L (ref 0–44)
AST: 15 U/L (ref 15–41)
Albumin: 3.3 g/dL — ABNORMAL LOW (ref 3.5–5.0)
Alkaline Phosphatase: 79 U/L (ref 38–126)
Anion gap: 11 (ref 5–15)
BUN: 22 mg/dL — ABNORMAL HIGH (ref 6–20)
CO2: 30 mmol/L (ref 22–32)
Calcium: 9.6 mg/dL (ref 8.9–10.3)
Chloride: 103 mmol/L (ref 98–111)
Creatinine: 0.82 mg/dL (ref 0.44–1.00)
GFR, Est AFR Am: >60 mL/min (ref >60)
GFR, Estimated: >60 mL/min (ref >60)
Glucose, Bld: 84 mg/dL (ref 70–99)
Potassium: 4.2 mmol/L (ref 3.5–5.1)
Sodium: 144 mmol/L (ref 135–145)
Total Bilirubin: 0.2 mg/dL — ABNORMAL LOW (ref 0.3–1.2)
Total Protein: 7.3 g/dL (ref 6.5–8.1)

## 2018-05-02 MED ORDER — PROCHLORPERAZINE MALEATE 10 MG PO TABS: 10.0000 mg | ORAL_TABLET | Freq: Four times a day (QID) | ORAL | 0 refills | Status: DC | PRN

## 2018-05-02 NOTE — Progress Notes (Signed)
Oncology Nurse Navigator Documentation  Oncology Nurse Navigator Flowsheets 05/02/2018  Navigator Location [facility]  Navigator Encounter Type Clinic/MDC/I spoke with patient and family today.  I gave and explained information on lung cancer, treatments, side effects, resources, and next steps.    Multidisiplinary Clinic Date 05/02/2018  Patient Visit Type MedOnc  Treatment Phase Pre-Tx/Tx Discussion  Barriers/Navigation Needs Education  Education Understanding Cancer/ Treatment Options;Newly Diagnosed Cancer Education;Other  Interventions Education  Education Method Verbal;Written  Acuity Level 2  Time Spent with Patient 30

## 2018-05-02 NOTE — Progress Notes (Signed)
[place] Clinical Social Work  Clinical Social Work met with patient/family at [employer] appointment to offer support and assess for psychosocial needs. Patient was accompanied by her spouse and daughter.  Mrs. Reinhold shared her main concern is "living"- patient began to get tearful, stated that she did not want to elaborate at this time.  CSW normalized patient's feelings of fear and anxiety during this time. CSW reviewed practical concerns such as applying for disability and transportation.  CSW will make referral to [employer] [facility] for disability assistance and [place] transportation assistance.  ONCBCN DISTRESS SCREENING 05/02/2018  Screening Type Initial Screening  Distress experienced in past week (1-10) 8  Family Problem type Partner;Children  Emotional problem type Depression;Nervousness/Anxiety;Adjusting to illness;Isolation/feeling alone;Feeling hopeless  Spiritual/Religous concerns type Facing my mortality  Information Concerns Type Lack of info about diagnosis;Lack of info about treatment;Lack of info about complementary therapy choices  Physical Problem type Getting around;Sleep/insomnia;Breathing;Skin dry/itchy    Clinical Social Work briefly discussed Clinical Social Work role and [employer] support programs/services.  Clinical Social Work encouraged patient to call with any additional questions or concerns.   Maryjean Morn, MSW, LCSW, OSW-C Clinical Social Worker [facility] [phone]

## 2018-05-02 NOTE — Telephone Encounter (Signed)
Scheduled appt per 10/17 los - gave patient AVS and calender per los.   

## 2018-05-02 NOTE — Progress Notes (Signed)
[place] Telephone:([phone]) [phone]   Fax:(336) [phone] Multidisciplinary thoracic oncology clinic  CONSULT NOTE  REFERRING PHYSICIAN: Dr. Kara Mead  REASON FOR CONSULTATION:  62 years old white female recently diagnosed with lung cancer.  HPI Deborah [name] is a 62 y.o. female with past medical history significant for COPD, congestive heart failure, asthma, depression, diverticulitis, history of GI bleed, congestive heart failure, hypertension, pneumonia as well as obesity and sleep apnea.  The patient was followed by Dr. Fredda Hammed for her COPD and was complaining of chronic cough and a routine chest x-ray on 04/03/2018 showed new masslike opacity in the posterior left mid lung measuring 6.5 cm with adjacent peripheral satellite nodules suspicious for primary bronchogenic carcinoma.  This was followed by CT scan of the chest with contrast on 04/08/2018 and that showed a cavitary mass on the left extending into the left hilum measuring 7.2 x 6.2 x 4.7 cm.  This lesion appears to arise in the superior segment of the left lower lobe.  There was patchy infiltrate in the periphery of the left lower lobe likely due to postobstructive pneumonitis.  No other masses.  There was adenopathy adjacent to the left carina felt to be adenopathy in the left hilar region.  A PET scan was performed on April 18, 2018 and that showed large necrotic appearing left lower lobe lung mass demonstrate market hypermetabolism most consistent with primary lung neoplasm.  There was also left-sided mediastinal adenopathy.  There was no distant metastatic disease identified in the neck, abdomen/pelvis or osseous structures. On April 16, 2018 the patient underwent video bronchoscopy under the care of Dr. Elsworth Soho and the final pathology ([id number]) was consistent with squamous cell carcinoma.  PDL 1 expression was performed and it was negative. Dr. Elsworth Soho kindly referred the patient to the multidisciplinary thoracic  oncology clinic today for evaluation and recommendation regarding treatment of her condition. When seen today the patient is feeling fine except for the baseline shortness of breath and cough with light hemoptysis in the last few weeks.  She denied having any chest pain.  She denied having any nausea, vomiting, diarrhea or constipation.  She has no headache but has some visual changes.  She denied having any weight loss but has intermittent night sweats. Family history significant for mother with heart disease and father with congestive heart failure. The patient is married and has 1 daughter.  She was accompanied today by her husband Deborah [name] and her daughter Deborah [name].  The patient used to work in [employer] as well as [employer].  She has a history for smoking up to 3.5 pack/day for around 40 years and quit 5 years ago.  She has no history of alcohol or drug abuse.  HPI  Past Medical History:  Diagnosis Date  . Anemia    as teen  . Asthma   . CHF (congestive heart failure) ([place])   . COPD, mild ([place])   . Depression   . Diverticulitis   . Family history of anesthesia complication    vomiting  . GI bleeding   . Heart failure ([place])    New onset 07/25/14  . Histoplasmosis    left eye  . Hyperkalemia   . Hypertension   . Obesity (BMI 30-39.9)   . Pneumonia    dx wtih pneumonia on 05/27/16- seen by [facility]   . PONV (postoperative nausea and vomiting)   . Restrictive lung disease   . Shortness of breath  with exertion   . Sleep apnea    mask and oxygen at nite for sleep at 2L   . Tobacco abuse   . Umbilical hernia     Past Surgical History:  Procedure Laterality Date  . APPLICATION OF WOUND VAC  03/19/2013   Procedure: APPLICATION OF WOUND VAC;  Surgeon: Madilyn Hook, DO;  Location: [facility];  Service: General;;  . CESAREAN SECTION  04/12/85  . COLONOSCOPY WITH PROPOFOL N/A 06/13/2016   Procedure: COLONOSCOPY WITH PROPOFOL;  Surgeon: Jerene Bears, MD;   Location: [facility];  Service: Gastroenterology;  Laterality: N/A;  . ERCP N/A 02/15/2017   Procedure: ENDOSCOPIC RETROGRADE CHOLANGIOPANCREATOGRAPHY (ERCP);  Surgeon: [name], MD;  Location: [facility];  Service: Endoscopy;  Laterality: N/A;  . INSERTION OF MESH N/A 03/19/2013   Procedure: INSERTION OF MESH;  Surgeon: Madilyn Hook, DO;  Location: [facility];  Service: General;  Laterality: N/A;  . VENTRAL HERNIA REPAIR N/A 03/19/2013   Procedure:  OPEN VENTRAL HERNIA REPAIR WITH MESH AND APPLICATION OF WOUND VAC;  Surgeon: Madilyn Hook, DO;  Location: [facility];  Service: General;  Laterality: N/A;  . VIDEO BRONCHOSCOPY Bilateral 04/19/2018   Procedure: VIDEO BRONCHOSCOPY WITH FLUORO;  Surgeon: Rigoberto Noel, MD;  Location: [facility];  Service: Cardiopulmonary;  Laterality: Bilateral;    Family History  Problem Relation Age of Onset  . Heart attack Mother   . Emphysema Mother        was a smoker  . Lymphoma Maternal Uncle   . Pancreatic cancer Maternal Aunt   . Emphysema Unknown        Mother  . Heart failure Unknown        Father  . Cancer Unknown        Unknown  . Colon cancer Neg Hx   . Breast cancer Neg Hx     Social History Social History   Tobacco Use  . Smoking status: Former Smoker    Packs/day: 3.00    Years: 43.00    Pack years: 129.00    Types: Cigarettes    Last attempt to quit: 12/17/2012    Years since quitting: 5.3  . Smokeless tobacco: Never Used  Substance Use Topics  . Alcohol use: No    Alcohol/week: 0.0 standard drinks  . Drug use: No    No Known Allergies  Current Outpatient Medications  Medication Sig Dispense Refill  . acetaminophen (TYLENOL) 500 MG tablet Take 1,500 mg by mouth 2 (two) times daily as needed for moderate pain or headache.    . albuterol (PROVENTIL) (2.5 MG/3ML) 0.083% nebulizer solution USE 1 VIAL VIA NEBULIZER EVERY 3 HOURS AS NEEDED FOR WHEEZING OR SHORTNESS OF BREATH (Patient taking differently: Take 2.5 mg by  nebulization every 3 (three) hours as needed for wheezing or shortness of breath. ) 1125 mL 1  . aspirin 81 MG chewable tablet Chew 1 tablet (81 mg total) by mouth daily. 30 tablet 1  . diltiazem (CARTIA XT) 120 MG 24 hr capsule Take 1 capsule (120 mg total) by mouth daily. 90 capsule 1  . Fluticasone-Umeclidin-Vilant (TRELEGY ELLIPTA) 100-62.5-25 MCG/INH AEPB Inhale 1 puff into the lungs daily. 1 each 5  . furosemide (LASIX) 20 MG tablet Take 2qam; and take 1at 3pm (Patient taking differently: Take 20-40 mg by mouth See admin instructions. Take 40mg  by mouth in the morning and 20mg  at 3:00pm) 270 tablet 0  . ipratropium-albuterol (DUONEB) 0.5-2.5 (3) MG/3ML SOLN USE 1 VIAL VIA NEBULIZER  EVERY 6 HOURS AS NEEDED (Patient taking differently: Inhale 3 mLs into the lungs every 6 (six) hours as needed (shortness of breath or wheezing). ) 360 mL 2  . montelukast (SINGULAIR) 10 MG tablet TAKE 1 TABLET(10 MG) BY MOUTH AT BEDTIME 90 tablet 1  . OXYGEN Inhale 4 L/min into the lungs continuous.     . potassium chloride SA (K-DUR,KLOR-CON) 20 MEQ tablet TAKE 1/2 TABLET(10 MEQ) BY MOUTH DAILY (Patient taking differently: Take 10 mEq by mouth daily. TAKE 1/2 TABLET(10 MEQ) BY MOUTH DAILY) 45 tablet 1  . Spacer/Aero-Holding Chambers (AEROCHAMBER MV) inhaler Use as instructed 1 each 0  . VENTOLIN HFA 108 (90 Base) MCG/ACT inhaler INHALE 2 PUFFS BY MOUTH EVERY 6 HOURS AS NEEDED FOR WHEEZING OR SHORTNESS OF BREATH (Patient taking differently: Inhale 2 puffs into the lungs every 6 (six) hours as needed for wheezing or shortness of breath. ) 18 g 5  . vitamin B-12 (CYANOCOBALAMIN) 500 MCG tablet Take 500 mcg by mouth daily.     No current facility-administered medications for this visit.     Review of Systems  Constitutional: positive for fatigue Eyes: negative Ears, nose, mouth, throat, and face: negative Respiratory: positive for cough, dyspnea on exertion and hemoptysis Cardiovascular:  negative Gastrointestinal: negative Genitourinary:negative Integument/breast: negative Hematologic/lymphatic: negative Musculoskeletal:negative Neurological: negative Behavioral/Psych: negative Endocrine: negative Allergic/Immunologic: negative  Physical Exam  WPY:KDXIP, healthy, no distress, well nourished, well developed and obese SKIN: skin color, texture, turgor are normal, no rashes or significant lesions HEAD: Normocephalic, No masses, lesions, tenderness or abnormalities EYES: normal, PERRLA, Conjunctiva are pink and non-injected EARS: External ears normal, Canals clear OROPHARYNX:no exudate and no erythema  NECK: supple, no adenopathy, no JVD LYMPH:  no palpable lymphadenopathy, no hepatosplenomegaly BREAST:not examined LUNGS: prolonged expiratory phase HEART: regular rate & rhythm, no murmurs and no gallops ABDOMEN:abdomen soft, non-tender, obese, normal bowel sounds and no masses or organomegaly BACK: Back symmetric, no curvature., No CVA tenderness EXTREMITIES:no joint deformities, effusion, or inflammation, no edema  NEURO: alert & oriented x 3 with fluent speech, no focal motor/sensory deficits  PERFORMANCE STATUS: ECOG 1  LABORATORY DATA: Lab Results  Component Value Date   WBC 8.9 05/02/2018   HGB 14.1 05/02/2018   HCT 44.8 05/02/2018   MCV 93.3 05/02/2018   PLT 269 05/02/2018      Chemistry      Component Value Date/Time   NA 140 11/12/2017 1611   NA 143 11/23/2016 1504   K 4.1 11/12/2017 1611   CL 100 11/12/2017 1611   CO2 28 11/12/2017 1611   BUN 20 11/12/2017 1611   BUN 14 11/23/2016 1504   CREATININE 0.80 11/12/2017 1611      Component Value Date/Time   CALCIUM 9.2 11/12/2017 1611   ALKPHOS 67 11/12/2017 1611   AST 34 11/12/2017 1611   ALT 42 (H) 11/12/2017 1611   BILITOT 0.3 11/12/2017 1611       RADIOGRAPHIC STUDIES: Dg Chest 2 View  Result Date: 04/04/2018 CLINICAL DATA:  COPD, no acute symptoms reported EXAM: CHEST - 2 VIEW  COMPARISON:  06/21/2016 chest radiograph. FINDINGS: Stable cardiomediastinal silhouette with top-normal heart size. No pneumothorax. No pleural effusion. There is a new masslike opacity in the posterior left mid lung measuring approximately 6.5 cm with adjacent peripheral satellite nodule. No pulmonary edema. Clear right lung. IMPRESSION: New masslike opacity in the posterior left mid lung measuring 6.5 cm with adjacent peripheral satellite nodule. Primary bronchogenic carcinoma not excluded. Chest CT with IV contrast  recommended for further evaluation. These results will be called to the ordering clinician or representative by the Radiologist Assistant, and communication documented in the PACS or zVision Dashboard. Electronically Signed   By: Ilona Sorrel M.D.   On: 04/04/2018 08:53   Ct Chest W Contrast  Result Date: 04/08/2018 CLINICAL DATA:  Lung mass seen on chest radiograph EXAM: CT CHEST WITH CONTRAST TECHNIQUE: Multidetector CT imaging of the chest was performed during intravenous contrast administration. CONTRAST:  59mL ISOVUE-300 IOPAMIDOL (ISOVUE-300) INJECTION 61% COMPARISON:  Chest radiograph June 03, 2018 FINDINGS: Cardiovascular: There is no thoracic aortic aneurysm or dissection. No evident pulmonary embolus. Visualized great vessels appear unremarkable except for scattered calcifications in the proximal aspects of these vessels without hemodynamically significant obstruction. There is aortic atherosclerosis in the thoracic aorta. There are foci of coronary artery calcification. There is no pericardial effusion or pericardial thickening evident. Mediastinum/Nodes: Visualized thyroid appears normal. There is adenopathy to the left of the carina measuring 2.6 x 2.2 cm. A second lymph node in this area measures 1.5 x 1.5 cm. There is apparent adenopathy in the left hilum which is difficult to distinguish from adjacent mass which extends into the left hilum. No esophageal lesions are evident.  Lungs/Pleura: There is a mass with cavitation arising in the superior segment of the left lower lobe with invasion of the left hilum. This mass measures 7.2 x 6.2 x 4.7 cm. There is patchy infiltrate in the periphery of the left lower lobe, likely due at least in part to a degree of postobstructive pneumonitis. There is mild underlying centrilobular emphysematous change. There is mild atelectatic change in the right middle lobe. No other parenchymal nodular lesions are evident. Upper Abdomen: No adrenal lesions are evident. There is a degree of hepatic steatosis. There are calcified granulomas in the spleen. There is upper abdominal aortic atherosclerosis. Musculoskeletal: There is degenerative change in the thoracic spine. No blastic or lytic bone lesions are evident. There are no chest wall lesions. IMPRESSION: 1. There is a cavitary mass on the left extending into the left hilum measuring 7.2 x 6.2 x 4.7 cm. This lesion appears to arise in the superior segment of the left lower lobe. There is patchy infiltrate in the periphery of the left lower lobe elsewhere, likely in part due to postobstructive pneumonitis. No other mass. 2. There is adenopathy adjacent to the left carina. There is felt to be adenopathy in the left hilar region which is difficult to delineate from the adjacent mass. 3. Aortic atherosclerosis. There are foci of great vessel and coronary artery calcification. 4.  There is mild underlying emphysematous change. Comment: PET-CT could be helpful assessment and staging of this apparent lung carcinoma arising on the left side with extension into the left hilar region. These results will be called to the ordering clinician or representative by the Radiologist Assistant, and communication documented in the PACS or zVision Dashboard. Aortic Atherosclerosis (ICD10-I70.0) and Emphysema (ICD10-J43.9). Electronically Signed   By: Lowella Grip III M.D.   On: 04/08/2018 10:49   Nm Pet Image Initial (pi)  Skull Base To Thigh  Result Date: 04/18/2018 CLINICAL DATA:  Initial treatment strategy for left lower lobe lung mass. EXAM: NUCLEAR MEDICINE PET SKULL BASE TO THIGH TECHNIQUE: 10.5 mCi F-18 FDG was injected intravenously. Full-ring PET imaging was performed from the skull base to thigh after the radiotracer. CT data was obtained and used for attenuation correction and anatomic localization. Fasting blood glucose: 120 mg/dl COMPARISON:  Chest CT 04/08/2018  FINDINGS: Mediastinal blood pool activity: SUV max 2.80 NECK: No hypermetabolic lymph nodes in the neck. Incidental CT findings: none CHEST: Large necrotic appearing left lower lobe lung mass is markedly hypermetabolic with SUV max of 41.32 and consistent with primary lung neoplasm. 2 cm left paratracheal/AP window lymph node is hypermetabolic with SUV max of 44.0. No contralateral lymphadenopathy. No metastatic pulmonary nodules are identified. There are calcified granulomas noted in the right lower lobe along with calcified right mediastinal nodes. No breast masses, supraclavicular or axillary adenopathy. Incidental CT findings: Moderate atherosclerotic calcifications involving the aorta and branch vessels and scattered three-vessel coronary artery calcifications. Small hiatal hernia. ABDOMEN/PELVIS: No abnormal hypermetabolic activity within the liver, pancreas, adrenal glands, or spleen. No hypermetabolic lymph nodes in the abdomen or pelvis. Incidental CT findings: Moderate to advanced atherosclerotic calcifications involving the aorta and iliac arteries. Bilateral inguinal hernias containing fat. Colonic diverticulosis without findings for acute diverticulitis. SKELETON: No focal hypermetabolic activity to suggest skeletal metastasis. Incidental CT findings: none IMPRESSION: 1. Large necrotic appearing left lower lobe lung mass demonstrates marked hypermetabolism and is most consistent with a primary lung neoplasm. There is also left sided mediastinal  lymphadenopathy. Recommend bronchoscopic biopsy. 2. No distant metastatic disease is identified in the neck, abdomen/pelvis or osseous structures. Electronically Signed   By: Marijo Sanes M.D.   On: 04/18/2018 15:12   Dg Chest Port 1 View  Result Date: 04/19/2018 CLINICAL DATA:  Post bronchoscopy left lower lobe EXAM: PORTABLE CHEST 1 VIEW COMPARISON:  Chest CT 04/08/2018.  Chest x-ray 04/03/2018. FINDINGS: Left perihilar mass again noted. No pneumothorax following bronchoscopy. Heart is borderline in size. No effusions or confluent airspace opacity otherwise. IMPRESSION: Left perihilar mass, unchanged. No pneumothorax following bronchoscopy. Electronically Signed   By: Rolm Baptise M.D.   On: 04/19/2018 08:55   Dg C-arm Bronchoscopy  Result Date: 04/19/2018 C-ARM BRONCHOSCOPY: Fluoroscopy was utilized by the requesting physician.  No radiographic interpretation.    ASSESSMENT: This is a very pleasant 63 years old white female recently diagnosed with a stage IIIb (T3, N2, M0) non-small cell lung cancer, squamous cell carcinoma presented with large left lower lobe lung mass in addition to mediastinal lymphadenopathy diagnosed in October 2019. PDL 1 expression is negative   PLAN: I had a lengthy discussion with the patient and her family today about her current disease stage, prognosis and treatment options. I recommended for the patient to complete the staging work-up by ordering MRI of the brain to rule out brain metastasis. I discussed with the patient her treatment options including a course of concurrent chemoradiation with weekly carboplatin for AUC of 2 and paclitaxel 45 NG/M2.  This will be followed by consolidation immunotherapy with Imfinzi (Durvalumab) if the patient has no evidence for disease progression after the induction concurrent chemoradiation. I discussed with the patient the adverse effect of this treatment including but not limited to alopecia, myelosuppression, nausea and  vomiting, peripheral neuropathy, liver or renal dysfunction. She is expected to start the first cycle of this treatment on May 13, 2018. I will arrange for the patient to have a chemotherapy education class before the first dose of her treatment. I will order Compazine 10 mg p.o. every 6 hours as needed for nausea. The patient will come back for follow-up visit in 2 weeks for evaluation and management of any adverse effect of her treatment. The patient will see Dr. Lisbeth Renshaw from radiation oncology later today for discussion of the radiotherapy option. She was seen during the multidisciplinary  thoracic oncology clinic by medical oncology, radiation oncology, thoracic navigator as well as social worker. The patient was advised to call immediately if she has any concerning symptoms in the interval. The patient voices understanding of current disease status and treatment options and is in agreement with the current care plan.  All questions were answered. The patient knows to call the clinic with any problems, questions or concerns. We can certainly see the patient much sooner if necessary.  Thank you so much for allowing me to participate in the care of [employer]. I will continue to follow up the patient with you and assist in her care.  I spent 55 minutes counseling the patient face to face. The total time spent in the appointment was 80 minutes.  Disclaimer: This note was dictated with voice recognition software. Similar sounding words can inadvertently be transcribed and may not be corrected upon review.   Eilleen Kempf May 02, 2018, 2:48 PM

## 2018-05-02 NOTE — Progress Notes (Signed)
START ON PATHWAY REGIMEN - Non-Small Cell Lung     Administer weekly:     Paclitaxel      Carboplatin   **Always confirm dose/schedule in your pharmacy ordering system**  Patient Characteristics: Stage III - Unresectable, PS = 0, 1 AJCC T Category: T3 Current Disease Status: No Distant Mets or Local Recurrence AJCC N Category: N2 AJCC M Category: M0 AJCC 8 Stage Grouping: IIIB Performance Status: PS = 0, 1 Intent of Therapy: Curative Intent, Discussed with Patient

## 2018-05-03 ENCOUNTER — Other Ambulatory Visit: Payer: Self-pay | Admitting: Family Medicine

## 2018-05-04 NOTE — Progress Notes (Signed)
Radiation Oncology         [phone]) [phone] ________________________________  Name: Deborah [name]        MRN: [id number]  Date of Service: 05/02/2018 DOB: 1955-08-24  TF:TDDU, Marciano Sequin, MD  Rigoberto Noel, MD     REFERRING PHYSICIAN: Rigoberto Noel, MD   DIAGNOSIS: The primary encounter diagnosis was Stage III squamous cell carcinoma of left lung ([place]). A diagnosis of Malignant neoplasm of unspecified part of unspecified bronchus or lung ([place]) was also pertinent to this visit.   HISTORY OF PRESENT ILLNESS: Deborah [name] is a 62 y.o. female seen at the request of Dr. Elsworth Soho for a new diagnosis of left lower lobe lung cancer.  The patient has a history of COPD and congestive heart failure, she has been on continuous oxygen at 2 L since 2016, recently she began experiencing  increasing shortness of breath as well as a cough cough.  She underwent work-up with a chest x-ray followed by CT scan of the chest.  The CT was on 04/08/2018 and revealed a mass in the left lower lobe that was necrotic appearing in the central portion measuring up to 6.2 cm.  She did not have any visible adenopathy or additional concerns for disease, a PET scan on 04/18/2018 revealed an SUV of 28.43 in the LLL, a 2 cm left paratracheal/AP window lymph node was hypermetabolic with an SUV of 19, atherosclerotic calcifications as well as three-vessel coronary calcifications were noted, no evidence of distant metastatic disease was identified.  She underwent bronchoscopy on 04/19/2018 which revealed a squamous cell carcinoma arising in the primary tumor.  She has had PDL 1 testing which was negative.  She is scheduled to undergo MRI of the brain on 05/06/2018 and comes today to discuss options of treatment for her cancer.    PREVIOUS RADIATION THERAPY: No   PAST MEDICAL HISTORY:  Past Medical History:  Diagnosis Date  . Anemia    as teen  . Asthma   . CHF (congestive heart failure) ([place])   . COPD, mild ([place])   . Depression    . Diverticulitis   . Family history of anesthesia complication    vomiting  . GI bleeding   . Heart failure ([place])    New onset 07/25/14  . Histoplasmosis    left eye  . Hyperkalemia   . Hypertension   . Obesity (BMI 30-39.9)   . Pneumonia    dx wtih pneumonia on 05/27/16- seen by [facility]   . PONV (postoperative nausea and vomiting)   . Restrictive lung disease   . Shortness of breath    with exertion   . Sleep apnea    mask and oxygen at nite for sleep at 2L   . Tobacco abuse   . Umbilical hernia        PAST SURGICAL HISTORY: Past Surgical History:  Procedure Laterality Date  . APPLICATION OF WOUND VAC  03/19/2013   Procedure: APPLICATION OF WOUND VAC;  Surgeon: Madilyn Hook, DO;  Location: [facility];  Service: General;;  . CESAREAN SECTION  04/12/85  . COLONOSCOPY WITH PROPOFOL N/A 06/13/2016   Procedure: COLONOSCOPY WITH PROPOFOL;  Surgeon: Jerene Bears, MD;  Location: [facility];  Service: Gastroenterology;  Laterality: N/A;  . ERCP N/A 02/15/2017   Procedure: ENDOSCOPIC RETROGRADE CHOLANGIOPANCREATOGRAPHY (ERCP);  Surgeon: [name], MD;  Location: [facility];  Service: Endoscopy;  Laterality: N/A;  . INSERTION OF MESH N/A 03/19/2013   Procedure: INSERTION  OF MESH;  Surgeon: Madilyn Hook, DO;  Location: [facility];  Service: General;  Laterality: N/A;  . VENTRAL HERNIA REPAIR N/A 03/19/2013   Procedure:  OPEN VENTRAL HERNIA REPAIR WITH MESH AND APPLICATION OF WOUND VAC;  Surgeon: Madilyn Hook, DO;  Location: [facility];  Service: General;  Laterality: N/A;  . VIDEO BRONCHOSCOPY Bilateral 04/19/2018   Procedure: VIDEO BRONCHOSCOPY WITH FLUORO;  Surgeon: Rigoberto Noel, MD;  Location: [facility];  Service: Cardiopulmonary;  Laterality: Bilateral;     FAMILY HISTORY:  Family History  Problem Relation Age of Onset  . Heart attack Mother   . Emphysema Mother        was a smoker  . Lymphoma Maternal Uncle   . Pancreatic cancer Maternal Aunt   . Emphysema Unknown         Mother  . Heart failure Unknown        Father  . Cancer Unknown        Unknown  . Colon cancer Neg Hx   . Breast cancer Neg Hx      SOCIAL HISTORY:  reports that she quit smoking about 5 years ago. Her smoking use included cigarettes. She has a 129.00 pack-year smoking history. She has never used smokeless tobacco. She reports that she does not drink alcohol or use drugs.   ALLERGIES: Patient has no known allergies.   MEDICATIONS:  Current Outpatient Medications  Medication Sig Dispense Refill  . acetaminophen (TYLENOL) 500 MG tablet Take 1,500 mg by mouth 2 (two) times daily as needed for moderate pain or headache.    . albuterol (PROVENTIL) (2.5 MG/3ML) 0.083% nebulizer solution USE 1 VIAL VIA NEBULIZER EVERY 3 HOURS AS NEEDED FOR WHEEZING OR SHORTNESS OF BREATH (Patient taking differently: Take 2.5 mg by nebulization every 3 (three) hours as needed for wheezing or shortness of breath. ) 1125 mL 1  . aspirin 81 MG chewable tablet Chew 1 tablet (81 mg total) by mouth daily. 30 tablet 1  . diltiazem (CARTIA XT) 120 MG 24 hr capsule Take 1 capsule (120 mg total) by mouth daily. 90 capsule 1  . Fluticasone-Umeclidin-Vilant (TRELEGY ELLIPTA) 100-62.5-25 MCG/INH AEPB Inhale 1 puff into the lungs daily. 1 each 5  . furosemide (LASIX) 20 MG tablet Take 2qam; and take 1at 3pm (Patient taking differently: Take 20-40 mg by mouth See admin instructions. Take 40mg  by mouth in the morning and 20mg  at 3:00pm) 270 tablet 0  . ipratropium-albuterol (DUONEB) 0.5-2.5 (3) MG/3ML SOLN USE 1 VIAL VIA NEBULIZER EVERY 6 HOURS AS NEEDED (Patient taking differently: Inhale 3 mLs into the lungs every 6 (six) hours as needed (shortness of breath or wheezing). ) 360 mL 2  . montelukast (SINGULAIR) 10 MG tablet TAKE 1 TABLET(10 MG) BY MOUTH AT BEDTIME 90 tablet 1  . OXYGEN Inhale 4 L/min into the lungs continuous.     . potassium chloride SA (K-DUR,KLOR-CON) 20 MEQ tablet TAKE 1/2 TABLET(10 MEQ) BY MOUTH DAILY  (Patient taking differently: Take 10 mEq by mouth daily. TAKE 1/2 TABLET(10 MEQ) BY MOUTH DAILY) 45 tablet 1  . prochlorperazine (COMPAZINE) 10 MG tablet Take 1 tablet (10 mg total) by mouth every 6 (six) hours as needed for nausea or vomiting. 30 tablet 0  . Spacer/Aero-Holding Chambers (AEROCHAMBER MV) inhaler Use as instructed 1 each 0  . VENTOLIN HFA 108 (90 Base) MCG/ACT inhaler INHALE 2 PUFFS BY MOUTH EVERY 6 HOURS AS NEEDED FOR WHEEZING OR SHORTNESS OF BREATH (Patient not taking: No sig reported)  18 g 5  . vitamin B-12 (CYANOCOBALAMIN) 500 MCG tablet Take 500 mcg by mouth daily.     No current facility-administered medications for this encounter.      REVIEW OF SYSTEMS: On review of systems, the patient reports that she is doing well overall. She reports using 4L continuously now due to shortness of breath. She  denies any chest pain,  fevers, chills, night sweats, unintended weight changes, or hemoptysis. She denies any bowel or bladder disturbances, and denies abdominal pain, nausea or vomiting. She denies any new musculoskeletal or joint aches or pains. A complete review of systems is obtained and is otherwise negative.     PHYSICAL EXAM:  Wt Readings from Last 3 Encounters:  05/02/18 242 lb 1.6 oz (109.8 kg)  04/10/18 237 lb (107.5 kg)  04/03/18 236 lb 3.2 oz (107.1 kg)   Temp Readings from Last 3 Encounters:  05/02/18 98.1 F (36.7 C) (Oral)  04/19/18 98.4 F (36.9 C) (Oral)  11/12/17 98 F (36.7 C) (Oral)   BP Readings from Last 3 Encounters:  05/02/18 130/69  04/19/18 120/80  04/10/18 120/72   Pulse Readings from Last 3 Encounters:  05/02/18 (!) 114  04/10/18 99  04/03/18 99     In general this is a well appearing caucasian female in no acute distress. She is alert and oriented x4 and appropriate throughout the examination. HEENT reveals that the patient is normocephalic, atraumatic. EOMs are intact. PERRLA. Skin is intact without any evidence of gross lesions.  Cardiovascular exam reveals a regular rate and rhythm, no clicks rubs or murmurs are auscultated. Chest is clear to auscultation bilaterally. Lymphatic assessment is performed and does not reveal any adenopathy in the cervical, supraclavicular, axillary, or inguinal chains. Abdomen has active bowel sounds in all quadrants and is intact. The abdomen is soft, non tender, non distended. Lower extremities are negative for pretibial pitting edema, deep calf tenderness, cyanosis or clubbing.   ECOG = 1 0 - Asymptomatic (Fully active, able to carry on all predisease activities without restriction)  1 - Symptomatic but completely ambulatory (Restricted in physically strenuous activity but ambulatory and able to carry out work of a light or sedentary nature. For example, light housework, office work)  2 - Symptomatic, <50% in bed during the day (Ambulatory and capable of all self care but unable to carry out any work activities. Up and about more than 50% of waking hours)  3 - Symptomatic, >50% in bed, but not bedbound (Capable of only limited self-care, confined to bed or chair 50% or more of waking hours)  4 - Bedbound (Completely disabled. Cannot carry on any self-care. Totally confined to bed or chair)  5 - Death   Eustace Pen MM, Creech RH, Tormey DC, et al. [phone]). "Toxicity and response criteria of the [facility]". [place] Oncol. 5 (6): 649-55    LABORATORY DATA:  Lab Results  Component Value Date   WBC 8.9 05/02/2018   HGB 14.1 05/02/2018   HCT 44.8 05/02/2018   MCV 93.3 05/02/2018   PLT 269 05/02/2018   Lab Results  Component Value Date   NA 144 05/02/2018   K 4.2 05/02/2018   CL 103 05/02/2018   CO2 30 05/02/2018   Lab Results  Component Value Date   ALT 14 05/02/2018   AST 15 05/02/2018   ALKPHOS 79 05/02/2018   BILITOT 0.2 (L) 05/02/2018      RADIOGRAPHY: Ct Chest W Contrast  Result Date: 04/08/2018  CLINICAL DATA:  Lung mass seen on chest  radiograph EXAM: CT CHEST WITH CONTRAST TECHNIQUE: Multidetector CT imaging of the chest was performed during intravenous contrast administration. CONTRAST:  69mL ISOVUE-300 IOPAMIDOL (ISOVUE-300) INJECTION 61% COMPARISON:  Chest radiograph June 03, 2018 FINDINGS: Cardiovascular: There is no thoracic aortic aneurysm or dissection. No evident pulmonary embolus. Visualized great vessels appear unremarkable except for scattered calcifications in the proximal aspects of these vessels without hemodynamically significant obstruction. There is aortic atherosclerosis in the thoracic aorta. There are foci of coronary artery calcification. There is no pericardial effusion or pericardial thickening evident. Mediastinum/Nodes: Visualized thyroid appears normal. There is adenopathy to the left of the carina measuring 2.6 x 2.2 cm. A second lymph node in this area measures 1.5 x 1.5 cm. There is apparent adenopathy in the left hilum which is difficult to distinguish from adjacent mass which extends into the left hilum. No esophageal lesions are evident. Lungs/Pleura: There is a mass with cavitation arising in the superior segment of the left lower lobe with invasion of the left hilum. This mass measures 7.2 x 6.2 x 4.7 cm. There is patchy infiltrate in the periphery of the left lower lobe, likely due at least in part to a degree of postobstructive pneumonitis. There is mild underlying centrilobular emphysematous change. There is mild atelectatic change in the right middle lobe. No other parenchymal nodular lesions are evident. Upper Abdomen: No adrenal lesions are evident. There is a degree of hepatic steatosis. There are calcified granulomas in the spleen. There is upper abdominal aortic atherosclerosis. Musculoskeletal: There is degenerative change in the thoracic spine. No blastic or lytic bone lesions are evident. There are no chest wall lesions. IMPRESSION: 1. There is a cavitary mass on the left extending into the left  hilum measuring 7.2 x 6.2 x 4.7 cm. This lesion appears to arise in the superior segment of the left lower lobe. There is patchy infiltrate in the periphery of the left lower lobe elsewhere, likely in part due to postobstructive pneumonitis. No other mass. 2. There is adenopathy adjacent to the left carina. There is felt to be adenopathy in the left hilar region which is difficult to delineate from the adjacent mass. 3. Aortic atherosclerosis. There are foci of great vessel and coronary artery calcification. 4.  There is mild underlying emphysematous change. Comment: PET-CT could be helpful assessment and staging of this apparent lung carcinoma arising on the left side with extension into the left hilar region. These results will be called to the ordering clinician or representative by the Radiologist Assistant, and communication documented in the PACS or zVision Dashboard. Aortic Atherosclerosis (ICD10-I70.0) and Emphysema (ICD10-J43.9). Electronically Signed   By: Lowella Grip III M.D.   On: 04/08/2018 10:49   Nm Pet Image Initial (pi) Skull Base To Thigh  Result Date: 04/18/2018 CLINICAL DATA:  Initial treatment strategy for left lower lobe lung mass. EXAM: NUCLEAR MEDICINE PET SKULL BASE TO THIGH TECHNIQUE: 10.5 mCi F-18 FDG was injected intravenously. Full-ring PET imaging was performed from the skull base to thigh after the radiotracer. CT data was obtained and used for attenuation correction and anatomic localization. Fasting blood glucose: 120 mg/dl COMPARISON:  Chest CT 04/08/2018 FINDINGS: Mediastinal blood pool activity: SUV max 2.80 NECK: No hypermetabolic lymph nodes in the neck. Incidental CT findings: none CHEST: Large necrotic appearing left lower lobe lung mass is markedly hypermetabolic with SUV max of 16.10 and consistent with primary lung neoplasm. 2 cm left paratracheal/AP window lymph node is hypermetabolic  with SUV max of 19.8. No contralateral lymphadenopathy. No metastatic pulmonary  nodules are identified. There are calcified granulomas noted in the right lower lobe along with calcified right mediastinal nodes. No breast masses, supraclavicular or axillary adenopathy. Incidental CT findings: Moderate atherosclerotic calcifications involving the aorta and branch vessels and scattered three-vessel coronary artery calcifications. Small hiatal hernia. ABDOMEN/PELVIS: No abnormal hypermetabolic activity within the liver, pancreas, adrenal glands, or spleen. No hypermetabolic lymph nodes in the abdomen or pelvis. Incidental CT findings: Moderate to advanced atherosclerotic calcifications involving the aorta and iliac arteries. Bilateral inguinal hernias containing fat. Colonic diverticulosis without findings for acute diverticulitis. SKELETON: No focal hypermetabolic activity to suggest skeletal metastasis. Incidental CT findings: none IMPRESSION: 1. Large necrotic appearing left lower lobe lung mass demonstrates marked hypermetabolism and is most consistent with a primary lung neoplasm. There is also left sided mediastinal lymphadenopathy. Recommend bronchoscopic biopsy. 2. No distant metastatic disease is identified in the neck, abdomen/pelvis or osseous structures. Electronically Signed   By: Marijo Sanes M.D.   On: 04/18/2018 15:12   Dg Chest Port 1 View  Result Date: 04/19/2018 CLINICAL DATA:  Post bronchoscopy left lower lobe EXAM: PORTABLE CHEST 1 VIEW COMPARISON:  Chest CT 04/08/2018.  Chest x-ray 04/03/2018. FINDINGS: Left perihilar mass again noted. No pneumothorax following bronchoscopy. Heart is borderline in size. No effusions or confluent airspace opacity otherwise. IMPRESSION: Left perihilar mass, unchanged. No pneumothorax following bronchoscopy. Electronically Signed   By: Rolm Baptise M.D.   On: 04/19/2018 08:55   Dg C-arm Bronchoscopy  Result Date: 04/19/2018 C-ARM BRONCHOSCOPY: Fluoroscopy was utilized by the requesting physician.  No radiographic interpretation.        IMPRESSION/PLAN: 1. Stage IIIB, cT3N2M0 NSCLC, squamous cell carcinoma of the LLL. Dr. Lisbeth Renshaw discusses the pathology findings and reviews the nature of concurrent chemoradiation in this scenario.  We discussed the risks, benefits, short, and long term effects of radiotherapy, and the patient is interested in proceeding. Dr. Lisbeth Renshaw discusses the delivery and logistics of radiotherapy and anticipates a course of 6 1/2 weeks of radiotherapy. Written consent is obtained and placed in the chart, a copy was provided to the patient. She will be contacted by our office to simulate next Tuesday with the anticipation of starting treatment on 05/13/18.   The above documentation reflects my direct findings during this shared patient visit. Please see the separate note by Dr. Lisbeth Renshaw on this date for the remainder of the patient's plan of care.    Carola Rhine, PAC

## 2018-05-06 ENCOUNTER — Encounter (HOSPITAL_COMMUNITY): Payer: Self-pay

## 2018-05-06 ENCOUNTER — Ambulatory Visit (HOSPITAL_COMMUNITY)
Admission: RE | Admit: 2018-05-06 | Discharge: 2018-05-06 | Disposition: A | Discharge status: Home / Self Care | Payer: 59 | Source: Ambulatory Visit | Attending: Internal Medicine | Admitting: Internal Medicine

## 2018-05-06 ENCOUNTER — Inpatient Hospital Stay: Payer: 59

## 2018-05-06 DIAGNOSIS — C3490 Malignant neoplasm of unspecified part of unspecified bronchus or lung: Secondary | ICD-10-CM | POA: Insufficient documentation

## 2018-05-06 MED ORDER — GADOBUTROL 1 MMOL/ML IV SOLN
10.0000 mL | Freq: Once | INTRAVENOUS | Status: AC | PRN
  Administered 2018-05-06: 10 mL via INTRAVENOUS

## 2018-05-07 ENCOUNTER — Ambulatory Visit
Admission: RE | Admit: 2018-05-07 | Discharge: 2018-05-07 | Disposition: A | Discharge status: Home / Self Care | Payer: 59 | Source: Ambulatory Visit | Attending: Radiation Oncology | Admitting: Radiation Oncology

## 2018-05-07 VITALS — BP 99/60 | HR 82 | Temp 98.0°F | Resp 20

## 2018-05-07 VITALS — BP 99/60 | HR 82 | Temp 98.0°F | Resp 20 | Ht 61.0 in | Wt 241.0 lb

## 2018-05-07 DIAGNOSIS — Z510 Encounter for antineoplastic radiation therapy: Secondary | ICD-10-CM | POA: Diagnosis not present

## 2018-05-07 DIAGNOSIS — C3432 Malignant neoplasm of lower lobe, left bronchus or lung: Secondary | ICD-10-CM

## 2018-05-07 NOTE — Addendum Note (Signed)
Encounter addended by: Cori Razor, RN on: 05/07/2018 9:19 AM  Actions taken: Visit diagnoses modified

## 2018-05-07 NOTE — Progress Notes (Signed)
SAFETY ISSUES:  Prior radiation? No  Pacemaker/ICD? No  Possible current pregnancy? No  Is the patient on methotrexate? No  Additional Complaints / other details:   BP 99/60 (BP Location: Right Arm)   Pulse 82   Temp 98 F (36.7 C) (Oral)   Resp 20   SpO2 100%    Glorianne Proctor M. Leonie Green, BSN

## 2018-05-07 NOTE — Addendum Note (Signed)
Encounter addended by: Cori Razor, RN on: 05/07/2018 9:23 AM  Actions taken: Visit diagnoses modified

## 2018-05-10 DIAGNOSIS — Z510 Encounter for antineoplastic radiation therapy: Secondary | ICD-10-CM | POA: Diagnosis not present

## 2018-05-13 ENCOUNTER — Inpatient Hospital Stay: Payer: 59

## 2018-05-13 ENCOUNTER — Ambulatory Visit
Admission: RE | Admit: 2018-05-13 | Discharge: 2018-05-13 | Disposition: A | Discharge status: Home / Self Care | Payer: 59 | Source: Ambulatory Visit | Attending: Radiation Oncology | Admitting: Radiation Oncology

## 2018-05-13 VITALS — BP 113/73 | HR 81 | Temp 98.1°F | Resp 18

## 2018-05-13 DIAGNOSIS — C3492 Malignant neoplasm of unspecified part of left bronchus or lung: Secondary | ICD-10-CM

## 2018-05-13 DIAGNOSIS — Z510 Encounter for antineoplastic radiation therapy: Secondary | ICD-10-CM | POA: Diagnosis not present

## 2018-05-13 LAB — CMP (CANCER CENTER ONLY)
ALT: 12 U/L (ref 0–44)
AST: 15 U/L (ref 15–41)
Albumin: 3.2 g/dL — ABNORMAL LOW (ref 3.5–5.0)
Alkaline Phosphatase: 64 U/L (ref 38–126)
Anion gap: 11 (ref 5–15)
BUN: 19 mg/dL (ref 6–20)
CO2: 29 mmol/L (ref 22–32)
Calcium: 9.2 mg/dL (ref 8.9–10.3)
Chloride: 103 mmol/L (ref 98–111)
Creatinine: 0.81 mg/dL (ref 0.44–1.00)
GFR, Est AFR Am: >60 mL/min (ref >60)
GFR, Estimated: >60 mL/min (ref >60)
Glucose, Bld: 126 mg/dL — ABNORMAL HIGH (ref 70–99)
Potassium: 3.7 mmol/L (ref 3.5–5.1)
Sodium: 143 mmol/L (ref 135–145)
Total Bilirubin: 0.3 mg/dL (ref 0.3–1.2)
Total Protein: 6.8 g/dL (ref 6.5–8.1)

## 2018-05-13 LAB — CBC WITH DIFFERENTIAL (CANCER CENTER ONLY)
Abs Immature Granulocytes: 0.01 10*3/uL (ref 0.00–0.07)
Basophils Absolute: 0 10*3/uL (ref 0.0–0.1)
Basophils Relative: 0 %
Eosinophils Absolute: 0.3 10*3/uL (ref 0.0–0.5)
Eosinophils Relative: 3 %
HCT: 43.2 % (ref 36.0–46.0)
Hemoglobin: 13.4 g/dL (ref 12.0–15.0)
Immature Granulocytes: 0 %
Lymphocytes Relative: 19 %
Lymphs Abs: 1.6 10*3/uL (ref 0.7–4.0)
MCH: 29.6 pg (ref 26.0–34.0)
MCHC: 31 g/dL (ref 30.0–36.0)
MCV: 95.6 fL (ref 80.0–100.0)
Monocytes Absolute: 0.6 10*3/uL (ref 0.1–1.0)
Monocytes Relative: 7 %
Neutro Abs: 6 10*3/uL (ref 1.7–7.7)
Neutrophils Relative %: 71 %
Platelet Count: 236 10*3/uL (ref 150–400)
RBC: 4.52 MIL/uL (ref 3.87–5.11)
RDW: 12.9 % (ref 11.5–15.5)
WBC Count: 8.5 10*3/uL (ref 4.0–10.5)
nRBC: 0 % (ref 0.0–0.2)

## 2018-05-13 MED ORDER — PALONOSETRON HCL INJECTION 0.25 MG/5ML
0.2500 mg | Freq: Once | INTRAVENOUS | Status: AC
  Administered 2018-05-13: 0.25 mg via INTRAVENOUS

## 2018-05-13 MED ORDER — DIPHENHYDRAMINE HCL 50 MG/ML IJ SOLN
50.0000 mg | Freq: Once | INTRAMUSCULAR | Status: AC
  Administered 2018-05-13: 50 mg via INTRAVENOUS

## 2018-05-13 MED ORDER — PALONOSETRON HCL INJECTION 0.25 MG/5ML
INTRAVENOUS | Status: AC
  Filled 2018-05-13: qty 5

## 2018-05-13 MED ORDER — SODIUM CHLORIDE 0.9 % IV SOLN
Freq: Once | INTRAVENOUS | Status: AC
  Administered 2018-05-13: 09:00:00 via INTRAVENOUS
  Filled 2018-05-13: qty 250

## 2018-05-13 MED ORDER — FAMOTIDINE IN NACL 20-0.9 MG/50ML-% IV SOLN
20.0000 mg | Freq: Once | INTRAVENOUS | Status: AC
  Administered 2018-05-13: 20 mg via INTRAVENOUS

## 2018-05-13 MED ORDER — FAMOTIDINE IN NACL 20-0.9 MG/50ML-% IV SOLN
INTRAVENOUS | Status: AC
  Filled 2018-05-13: qty 50

## 2018-05-13 MED ORDER — SODIUM CHLORIDE 0.9 % IV SOLN
45.0000 mg/m2 | Freq: Once | INTRAVENOUS | Status: AC
  Administered 2018-05-13: 96 mg via INTRAVENOUS
  Filled 2018-05-13: qty 16

## 2018-05-13 MED ORDER — DIPHENHYDRAMINE HCL 50 MG/ML IJ SOLN
INTRAMUSCULAR | Status: AC
  Filled 2018-05-13: qty 1

## 2018-05-13 MED ORDER — SODIUM CHLORIDE 0.9 % IV SOLN
300.0000 mg | Freq: Once | INTRAVENOUS | Status: AC
  Administered 2018-05-13: 300 mg via INTRAVENOUS
  Filled 2018-05-13: qty 30

## 2018-05-13 MED ORDER — SODIUM CHLORIDE 0.9 % IV SOLN
20.0000 mg | Freq: Once | INTRAVENOUS | Status: AC
  Administered 2018-05-13: 20 mg via INTRAVENOUS
  Filled 2018-05-13: qty 2

## 2018-05-13 NOTE — Patient Instructions (Signed)
[facility] Discharge Instructions for Patients Receiving Chemotherapy  Today you received the following chemotherapy agents paclitaxel (Taxol) and carboplatin (Paraplatin).  To help prevent nausea and vomiting after your treatment, we encourage you to take your nausea medication as directed by your physician.    If you develop nausea and vomiting that is not controlled by your nausea medication, call the clinic.   BELOW ARE SYMPTOMS THAT SHOULD BE REPORTED IMMEDIATELY:  *FEVER GREATER THAN 100.5 F  *CHILLS WITH OR WITHOUT FEVER  NAUSEA AND VOMITING THAT IS NOT CONTROLLED WITH YOUR NAUSEA MEDICATION  *UNUSUAL SHORTNESS OF BREATH  *UNUSUAL BRUISING OR BLEEDING  TENDERNESS IN MOUTH AND THROAT WITH OR WITHOUT PRESENCE OF ULCERS  *URINARY PROBLEMS  *BOWEL PROBLEMS  UNUSUAL RASH Items with * indicate a potential emergency and should be followed up as soon as possible.  Feel free to call the clinic should you have any questions or concerns. The clinic phone number is [phone].  Please show the [facility] at check-in to the Emergency Department and triage nurse.  Paclitaxel injection What is this medicine? PACLITAXEL (PAK li TAX el) is a chemotherapy drug. It targets fast dividing cells, like cancer cells, and causes these cells to die. This medicine is used to treat ovarian cancer, breast cancer, and other cancers. This medicine may be used for other purposes; ask your health care provider or pharmacist if you have questions. COMMON BRAND NAME(S): Onxol, Taxol What should I tell my health care provider before I take this medicine? They need to know if you have any of these conditions: -blood disorders -irregular heartbeat -infection (especially a virus infection such as chickenpox, cold sores, or herpes) -liver disease -previous or ongoing radiation therapy -an unusual or allergic reaction to paclitaxel, alcohol, polyoxyethylated castor oil, other  chemotherapy agents, other medicines, foods, dyes, or preservatives -pregnant or trying to get pregnant -breast-feeding How should I use this medicine? This drug is given as an infusion into a vein. It is administered in a hospital or clinic by a specially trained health care professional. Talk to your pediatrician regarding the use of this medicine in children. Special care may be needed. Overdosage: If you think you have taken too much of this medicine contact a poison control center or emergency room at once. NOTE: This medicine is only for you. Do not share this medicine with others. What if I miss a dose? It is important not to miss your dose. Call your doctor or health care professional if you are unable to keep an appointment. What may interact with this medicine? Do not take this medicine with any of the following medications: -disulfiram -metronidazole This medicine may also interact with the following medications: -cyclosporine -diazepam -ketoconazole -medicines to increase blood counts like filgrastim, pegfilgrastim, sargramostim -other chemotherapy drugs like cisplatin, doxorubicin, epirubicin, etoposide, teniposide, vincristine -quinidine -testosterone -vaccines -verapamil Talk to your doctor or health care professional before taking any of these medicines: -acetaminophen -aspirin -ibuprofen -ketoprofen -naproxen This list may not describe all possible interactions. Give your health care provider a list of all the medicines, herbs, non-prescription drugs, or dietary supplements you use. Also tell them if you smoke, drink alcohol, or use illegal drugs. Some items may interact with your medicine. What should I watch for while using this medicine? Your condition will be monitored carefully while you are receiving this medicine. You will need important blood work done while you are taking this medicine. This medicine can cause serious allergic reactions. To  reduce your risk  you will need to take other medicine(s) before treatment with this medicine. If you experience allergic reactions like skin rash, itching or hives, swelling of the face, lips, or tongue, tell your doctor or health care professional right away. In some cases, you may be given additional medicines to help with side effects. Follow all directions for their use. This drug may make you feel generally unwell. This is not uncommon, as chemotherapy can affect healthy cells as well as cancer cells. Report any side effects. Continue your course of treatment even though you feel ill unless your doctor tells you to stop. Call your doctor or health care professional for advice if you get a fever, chills or sore throat, or other symptoms of a cold or flu. Do not treat yourself. This drug decreases your body's ability to fight infections. Try to avoid being around people who are sick. This medicine may increase your risk to bruise or bleed. Call your doctor or health care professional if you notice any unusual bleeding. Be careful brushing and flossing your teeth or using a toothpick because you may get an infection or bleed more easily. If you have any dental work done, tell your dentist you are receiving this medicine. Avoid taking products that contain aspirin, acetaminophen, ibuprofen, naproxen, or ketoprofen unless instructed by your doctor. These medicines may hide a fever. Do not become pregnant while taking this medicine. Women should inform their doctor if they wish to become pregnant or think they might be pregnant. There is a potential for serious side effects to an unborn child. Talk to your health care professional or pharmacist for more information. Do not breast-feed an infant while taking this medicine. Men are advised not to father a child while receiving this medicine. This product may contain alcohol. Ask your pharmacist or healthcare provider if this medicine contains alcohol. Be sure to tell all  healthcare providers you are taking this medicine. Certain medicines, like metronidazole and disulfiram, can cause an unpleasant reaction when taken with alcohol. The reaction includes flushing, headache, nausea, vomiting, sweating, and increased thirst. The reaction can last from 30 minutes to several hours. What side effects may I notice from receiving this medicine? Side effects that you should report to your doctor or health care professional as soon as possible: -allergic reactions like skin rash, itching or hives, swelling of the face, lips, or tongue -low blood counts - This drug may decrease the number of white blood cells, red blood cells and platelets. You may be at increased risk for infections and bleeding. -signs of infection - fever or chills, cough, sore throat, pain or difficulty passing urine -signs of decreased platelets or bleeding - bruising, pinpoint red spots on the skin, black, tarry stools, nosebleeds -signs of decreased red blood cells - unusually weak or tired, fainting spells, lightheadedness -breathing problems -chest pain -high or low blood pressure -mouth sores -nausea and vomiting -pain, swelling, redness or irritation at the injection site -pain, tingling, numbness in the hands or feet -slow or irregular heartbeat -swelling of the ankle, feet, hands Side effects that usually do not require medical attention (report to your doctor or health care professional if they continue or are bothersome): -bone pain -complete hair loss including hair on your head, underarms, pubic hair, eyebrows, and eyelashes -changes in the color of fingernails -diarrhea -loosening of the fingernails -loss of appetite -muscle or joint pain -red flush to skin -sweating This list may not describe all possible side effects.  Call your doctor for medical advice about side effects. You may report side effects to FDA at 1-800-FDA-1088. Where should I keep my medicine? This drug is given in  a hospital or clinic and will not be stored at home. NOTE: This sheet is a summary. It may not cover all possible information. If you have questions about this medicine, talk to your doctor, pharmacist, or health care provider.  2018 Elsevier/Gold Standard (2015-05-04 19:58:00)   Carboplatin injection What is this medicine? CARBOPLATIN (KAR boe pla tin) is a chemotherapy drug. It targets fast dividing cells, like cancer cells, and causes these cells to die. This medicine is used to treat ovarian cancer and many other cancers. This medicine may be used for other purposes; ask your health care provider or pharmacist if you have questions. COMMON BRAND NAME(S): Paraplatin What should I tell my health care provider before I take this medicine? They need to know if you have any of these conditions: -blood disorders -hearing problems -kidney disease -recent or ongoing radiation therapy -an unusual or allergic reaction to carboplatin, cisplatin, other chemotherapy, other medicines, foods, dyes, or preservatives -pregnant or trying to get pregnant -breast-feeding How should I use this medicine? This drug is usually given as an infusion into a vein. It is administered in a hospital or clinic by a specially trained health care professional. Talk to your pediatrician regarding the use of this medicine in children. Special care may be needed. Overdosage: If you think you have taken too much of this medicine contact a poison control center or emergency room at once. NOTE: This medicine is only for you. Do not share this medicine with others. What if I miss a dose? It is important not to miss a dose. Call your doctor or health care professional if you are unable to keep an appointment. What may interact with this medicine? -medicines for seizures -medicines to increase blood counts like filgrastim, pegfilgrastim, sargramostim -some antibiotics like amikacin, gentamicin, neomycin, streptomycin,  tobramycin -vaccines Talk to your doctor or health care professional before taking any of these medicines: -acetaminophen -aspirin -ibuprofen -ketoprofen -naproxen This list may not describe all possible interactions. Give your health care provider a list of all the medicines, herbs, non-prescription drugs, or dietary supplements you use. Also tell them if you smoke, drink alcohol, or use illegal drugs. Some items may interact with your medicine. What should I watch for while using this medicine? Your condition will be monitored carefully while you are receiving this medicine. You will need important blood work done while you are taking this medicine. This drug may make you feel generally unwell. This is not uncommon, as chemotherapy can affect healthy cells as well as cancer cells. Report any side effects. Continue your course of treatment even though you feel ill unless your doctor tells you to stop. In some cases, you may be given additional medicines to help with side effects. Follow all directions for their use. Call your doctor or health care professional for advice if you get a fever, chills or sore throat, or other symptoms of a cold or flu. Do not treat yourself. This drug decreases your body's ability to fight infections. Try to avoid being around people who are sick. This medicine may increase your risk to bruise or bleed. Call your doctor or health care professional if you notice any unusual bleeding. Be careful brushing and flossing your teeth or using a toothpick because you may get an infection or bleed more easily. If you have  any dental work done, tell your dentist you are receiving this medicine. Avoid taking products that contain aspirin, acetaminophen, ibuprofen, naproxen, or ketoprofen unless instructed by your doctor. These medicines may hide a fever. Do not become pregnant while taking this medicine. Women should inform their doctor if they wish to become pregnant or think  they might be pregnant. There is a potential for serious side effects to an unborn child. Talk to your health care professional or pharmacist for more information. Do not breast-feed an infant while taking this medicine. What side effects may I notice from receiving this medicine? Side effects that you should report to your doctor or health care professional as soon as possible: -allergic reactions like skin rash, itching or hives, swelling of the face, lips, or tongue -signs of infection - fever or chills, cough, sore throat, pain or difficulty passing urine -signs of decreased platelets or bleeding - bruising, pinpoint red spots on the skin, black, tarry stools, nosebleeds -signs of decreased red blood cells - unusually weak or tired, fainting spells, lightheadedness -breathing problems -changes in hearing -changes in vision -chest pain -high blood pressure -low blood counts - This drug may decrease the number of white blood cells, red blood cells and platelets. You may be at increased risk for infections and bleeding. -nausea and vomiting -pain, swelling, redness or irritation at the injection site -pain, tingling, numbness in the hands or feet -problems with balance, talking, walking -trouble passing urine or change in the amount of urine Side effects that usually do not require medical attention (report to your doctor or health care professional if they continue or are bothersome): -hair loss -loss of appetite -metallic taste in the mouth or changes in taste This list may not describe all possible side effects. Call your doctor for medical advice about side effects. You may report side effects to FDA at 1-800-FDA-1088. Where should I keep my medicine? This drug is given in a hospital or clinic and will not be stored at home. NOTE: This sheet is a summary. It may not cover all possible information. If you have questions about this medicine, talk to your doctor, pharmacist, or health care  provider.  2018 Elsevier/Gold Standard (2007-10-08 14:38:05)

## 2018-05-14 ENCOUNTER — Ambulatory Visit
Admission: RE | Admit: 2018-05-14 | Discharge: 2018-05-14 | Disposition: A | Discharge status: Home / Self Care | Payer: 59 | Source: Ambulatory Visit | Attending: Radiation Oncology | Admitting: Radiation Oncology

## 2018-05-14 DIAGNOSIS — Z510 Encounter for antineoplastic radiation therapy: Secondary | ICD-10-CM | POA: Diagnosis not present

## 2018-05-15 ENCOUNTER — Ambulatory Visit
Admission: RE | Admit: 2018-05-15 | Discharge: 2018-05-15 | Disposition: A | Discharge status: Home / Self Care | Payer: 59 | Source: Ambulatory Visit | Attending: Radiation Oncology | Admitting: Radiation Oncology

## 2018-05-15 DIAGNOSIS — C3492 Malignant neoplasm of unspecified part of left bronchus or lung: Secondary | ICD-10-CM

## 2018-05-15 DIAGNOSIS — Z510 Encounter for antineoplastic radiation therapy: Secondary | ICD-10-CM | POA: Diagnosis not present

## 2018-05-15 MED ORDER — SONAFINE EX EMUL
1.0000 "application " | Freq: Two times a day (BID) | CUTANEOUS | Status: DC
  Administered 2018-05-15: 1 via TOPICAL

## 2018-05-16 ENCOUNTER — Ambulatory Visit
Admission: RE | Admit: 2018-05-16 | Discharge: 2018-05-16 | Disposition: A | Discharge status: Home / Self Care | Payer: 59 | Source: Ambulatory Visit | Attending: Radiation Oncology | Admitting: Radiation Oncology

## 2018-05-16 ENCOUNTER — Telehealth: Payer: Self-pay | Admitting: Medical Oncology

## 2018-05-16 DIAGNOSIS — Z510 Encounter for antineoplastic radiation therapy: Secondary | ICD-10-CM | POA: Diagnosis not present

## 2018-05-16 NOTE — Telephone Encounter (Signed)
No answer

## 2018-05-17 ENCOUNTER — Ambulatory Visit
Admission: RE | Admit: 2018-05-17 | Discharge: 2018-05-17 | Disposition: A | Discharge status: Home / Self Care | Payer: 59 | Source: Ambulatory Visit | Attending: Radiation Oncology | Admitting: Radiation Oncology

## 2018-05-17 DIAGNOSIS — C3432 Malignant neoplasm of lower lobe, left bronchus or lung: Secondary | ICD-10-CM | POA: Insufficient documentation

## 2018-05-17 DIAGNOSIS — Z510 Encounter for antineoplastic radiation therapy: Secondary | ICD-10-CM | POA: Insufficient documentation

## 2018-05-20 ENCOUNTER — Inpatient Hospital Stay: Payer: 59

## 2018-05-20 ENCOUNTER — Inpatient Hospital Stay (HOSPITAL_BASED_OUTPATIENT_CLINIC_OR_DEPARTMENT_OTHER): Payer: 59 | Admitting: Internal Medicine

## 2018-05-20 ENCOUNTER — Encounter: Payer: Self-pay | Admitting: Medical Oncology

## 2018-05-20 ENCOUNTER — Encounter: Payer: Self-pay | Admitting: Internal Medicine

## 2018-05-20 ENCOUNTER — Inpatient Hospital Stay: Payer: 59 | Attending: Internal Medicine

## 2018-05-20 ENCOUNTER — Ambulatory Visit
Admission: RE | Admit: 2018-05-20 | Discharge: 2018-05-20 | Disposition: A | Discharge status: Home / Self Care | Payer: 59 | Source: Ambulatory Visit | Attending: Radiation Oncology | Admitting: Radiation Oncology

## 2018-05-20 ENCOUNTER — Telehealth: Payer: Self-pay | Admitting: Internal Medicine

## 2018-05-20 VITALS — BP 107/68 | HR 99 | Temp 97.7°F | Resp 18 | Ht 61.0 in | Wt 239.6 lb

## 2018-05-20 DIAGNOSIS — I10 Essential (primary) hypertension: Secondary | ICD-10-CM

## 2018-05-20 DIAGNOSIS — Z5111 Encounter for antineoplastic chemotherapy: Secondary | ICD-10-CM

## 2018-05-20 DIAGNOSIS — C3492 Malignant neoplasm of unspecified part of left bronchus or lung: Secondary | ICD-10-CM

## 2018-05-20 DIAGNOSIS — Z510 Encounter for antineoplastic radiation therapy: Secondary | ICD-10-CM | POA: Diagnosis not present

## 2018-05-20 DIAGNOSIS — C3432 Malignant neoplasm of lower lobe, left bronchus or lung: Secondary | ICD-10-CM | POA: Diagnosis present

## 2018-05-20 DIAGNOSIS — J984 Other disorders of lung: Secondary | ICD-10-CM

## 2018-05-20 LAB — CBC WITH DIFFERENTIAL (CANCER CENTER ONLY)
Abs Immature Granulocytes: 0.05 10*3/uL (ref 0.00–0.07)
Basophils Absolute: 0.1 10*3/uL (ref 0.0–0.1)
Basophils Relative: 1 %
Eosinophils Absolute: 0.2 10*3/uL (ref 0.0–0.5)
Eosinophils Relative: 2 %
HCT: 41.6 % (ref 36.0–46.0)
Hemoglobin: 13.2 g/dL (ref 12.0–15.0)
Immature Granulocytes: 1 %
Lymphocytes Relative: 13 %
Lymphs Abs: 1.2 10*3/uL (ref 0.7–4.0)
MCH: 29.5 pg (ref 26.0–34.0)
MCHC: 31.7 g/dL (ref 30.0–36.0)
MCV: 93.1 fL (ref 80.0–100.0)
Monocytes Absolute: 0.5 10*3/uL (ref 0.1–1.0)
Monocytes Relative: 5 %
Neutro Abs: 7.2 10*3/uL (ref 1.7–7.7)
Neutrophils Relative %: 78 %
Platelet Count: 262 10*3/uL (ref 150–400)
RBC: 4.47 MIL/uL (ref 3.87–5.11)
RDW: 12.7 % (ref 11.5–15.5)
WBC Count: 9.1 10*3/uL (ref 4.0–10.5)
nRBC: 0 % (ref 0.0–0.2)

## 2018-05-20 LAB — CMP (CANCER CENTER ONLY)
ALT: 22 U/L (ref 0–44)
AST: 18 U/L (ref 15–41)
Albumin: 3.2 g/dL — ABNORMAL LOW (ref 3.5–5.0)
Alkaline Phosphatase: 69 U/L (ref 38–126)
Anion gap: 10 (ref 5–15)
BUN: 19 mg/dL (ref 6–20)
CO2: 29 mmol/L (ref 22–32)
Calcium: 9.1 mg/dL (ref 8.9–10.3)
Chloride: 101 mmol/L (ref 98–111)
Creatinine: 0.84 mg/dL (ref 0.44–1.00)
GFR, Est AFR Am: >60 mL/min (ref >60)
GFR, Estimated: >60 mL/min (ref >60)
Glucose, Bld: 81 mg/dL (ref 70–99)
Potassium: 4.2 mmol/L (ref 3.5–5.1)
Sodium: 140 mmol/L (ref 135–145)
Total Bilirubin: 0.3 mg/dL (ref 0.3–1.2)
Total Protein: 6.9 g/dL (ref 6.5–8.1)

## 2018-05-20 MED ORDER — PALONOSETRON HCL INJECTION 0.25 MG/5ML
0.2500 mg | Freq: Once | INTRAVENOUS | Status: AC
  Administered 2018-05-20: 0.25 mg via INTRAVENOUS

## 2018-05-20 MED ORDER — SODIUM CHLORIDE 0.9 % IV SOLN
20.0000 mg | Freq: Once | INTRAVENOUS | Status: AC
  Administered 2018-05-20: 20 mg via INTRAVENOUS
  Filled 2018-05-20: qty 2

## 2018-05-20 MED ORDER — FAMOTIDINE IN NACL 20-0.9 MG/50ML-% IV SOLN: 20.0000 mg | Freq: Once | INTRAVENOUS | Status: DC

## 2018-05-20 MED ORDER — DIPHENHYDRAMINE HCL 50 MG/ML IJ SOLN
INTRAMUSCULAR | Status: AC
  Filled 2018-05-20: qty 1

## 2018-05-20 MED ORDER — SODIUM CHLORIDE 0.9 % IV SOLN
300.0000 mg | Freq: Once | INTRAVENOUS | Status: AC
  Administered 2018-05-20: 300 mg via INTRAVENOUS
  Filled 2018-05-20: qty 30

## 2018-05-20 MED ORDER — SODIUM CHLORIDE 0.9 % IV SOLN
45.0000 mg/m2 | Freq: Once | INTRAVENOUS | Status: AC
  Administered 2018-05-20: 96 mg via INTRAVENOUS
  Filled 2018-05-20: qty 16

## 2018-05-20 MED ORDER — SODIUM CHLORIDE 0.9 % IV SOLN
Freq: Once | INTRAVENOUS | Status: AC
  Administered 2018-05-20: 14:00:00 via INTRAVENOUS
  Filled 2018-05-20: qty 250

## 2018-05-20 MED ORDER — DIPHENHYDRAMINE HCL 50 MG/ML IJ SOLN
50.0000 mg | Freq: Once | INTRAMUSCULAR | Status: AC
  Administered 2018-05-20: 50 mg via INTRAVENOUS

## 2018-05-20 MED ORDER — PALONOSETRON HCL INJECTION 0.25 MG/5ML
INTRAVENOUS | Status: AC
  Filled 2018-05-20: qty 5

## 2018-05-20 NOTE — Telephone Encounter (Signed)
Appts already scheduled per 11/4 los - no additional appt added.

## 2018-05-20 NOTE — Research (Signed)
Consent for: Procurement of Human Biospecimens for the Discovery and Validation of Biomarkers for the Prediction, Diagnosis and Management of Disease I met with patient and spouse, in-clinic for scheduled appointments, in exam room after her appointment with Dr. Julien Nordmann. Patient states that Dr. Julien Nordmann discussed study with her and she is interested in participating. I gave patient a brief explanation about study as well and asked if patient wanted to take study ICF and HIPAA home to read over. Patient stated that she would like to participate and was interested in signing consent today. I reviewed with patient, page by page, both the study authorization and consent form. Patient expressed understanding to the purpose of the study, the voluntary nature of the study, the procedure, potential risks and benefits, as well as, how her information would be kept confidential, and any costs and the compensation. After all of patient's questions answered to her satisfaction, patient proceeded to sign both the study authorization and ICF. Patient was informed that the one time study blood draw would occur at her next lab appointment when in clinic, already scheduled for 05/27/2018. At that appointment, a research assistant will provide patient with the $50 debit card, provided by study, after  the blood draw. Patient gave verbal understanding and denied any other questions at this time. Patient was provided with copies of both signed documents, for her records, as well as the Clinical Trials information pamphlet and my contact information. Patient was thanked for  her time and her contribution to study and  was encouraged to call with questions.  Approximately 15 minutes was spent consenting patient. Maxwell Marion, RN, BSN, [facility] Clinical Research 05/20/2018 1:51 PM

## 2018-05-20 NOTE — Progress Notes (Signed)
[place] [phone]   [phone]  OFFICE PROGRESS NOTE  [name], MD [place] [address]  DIAGNOSIS: stage IIIB (T3, N2, M0) non-small cell lung cancer, squamous cell carcinoma presented with large left lower lobe lung mass in addition to mediastinal lymphadenopathy diagnosed in October 2019. PDL 1 expression is negative  PRIOR THERAPY:  CURRENT THERAPY: Concurrent chemoradiation with weekly carboplatin for AUC of 2 1 paclitaxel 45 mg/M2.  Status post 1 cycle.  INTERVAL HISTORY: Deborah [name] 62 y.o. female returns to the clinic today for follow-up visit accompanied by her husband.  The patient is feeling fine today with no concerning complaints except for intermittent headache on the left side of her head.  She tolerated the first week of her concurrent chemoradiation fairly well.  She denied having any chest pain but has the baseline shortness of breath with mild cough and no hemoptysis.  She denied having any nausea, vomiting, diarrhea or constipation.  She has no weight loss or night sweats.  She is here today for evaluation before starting cycle #2.  MEDICAL HISTORY: Past Medical History:  Diagnosis Date  . Anemia    as teen  . Asthma   . CHF (congestive heart failure) ([place])   . COPD, mild ([place])   . Depression   . Diverticulitis   . Family history of anesthesia complication    vomiting  . GI bleeding   . Heart failure ([place])    New onset 07/25/14  . Histoplasmosis    left eye  . Hyperkalemia   . Hypertension   . Obesity (BMI 30-39.9)   . Pneumonia    dx wtih pneumonia on 05/27/16- seen by [facility]   . PONV (postoperative nausea and vomiting)   . Restrictive lung disease   . Shortness of breath    with exertion   . Sleep apnea    mask and oxygen at nite for sleep at 2L   . Tobacco abuse   . Umbilical hernia     ALLERGIES:  has No Known Allergies.  MEDICATIONS:  Current Outpatient  Medications  Medication Sig Dispense Refill  . acetaminophen (TYLENOL) 500 MG tablet Take 1,500 mg by mouth 2 (two) times daily as needed for moderate pain or headache.    . albuterol (PROVENTIL) (2.5 MG/3ML) 0.083% nebulizer solution USE 1 VIAL VIA NEBULIZER EVERY 3 HOURS AS NEEDED FOR WHEEZING OR SHORTNESS OF BREATH (Patient taking differently: Take 2.5 mg by nebulization every 3 (three) hours as needed for wheezing or shortness of breath. ) 1125 mL 1  . aspirin 81 MG chewable tablet Chew 1 tablet (81 mg total) by mouth daily. 30 tablet 1  . diltiazem (CARDIZEM CD) 120 MG 24 hr capsule TAKE 1 CAPSULE BY MOUTH EVERY DAY 90 capsule 1  . Fluticasone-Umeclidin-Vilant (TRELEGY ELLIPTA) 100-62.5-25 MCG/INH AEPB Inhale 1 puff into the lungs daily. 1 each 5  . furosemide (LASIX) 20 MG tablet TAKE 2 TABLETS EACH MORNING AND 1 AT 3 PM 270 tablet 0  . ipratropium-albuterol (DUONEB) 0.5-2.5 (3) MG/3ML SOLN USE 1 VIAL VIA NEBULIZER EVERY 6 HOURS AS NEEDED (Patient taking differently: Inhale 3 mLs into the lungs every 6 (six) hours as needed (shortness of breath or wheezing). ) 360 mL 2  . montelukast (SINGULAIR) 10 MG tablet TAKE 1 TABLET(10 MG) BY MOUTH AT BEDTIME 90 tablet 1  . OXYGEN Inhale 4 L/min into the lungs continuous.     Marland Kitchen  potassium chloride SA (KLOR-CON M20) 20 MEQ tablet TAKE 1/2 TABLET(10 MEQ) BY MOUTH DAILY 45 tablet 1  . prochlorperazine (COMPAZINE) 10 MG tablet Take 1 tablet (10 mg total) by mouth every 6 (six) hours as needed for nausea or vomiting. 30 tablet 0  . Spacer/Aero-Holding Chambers (AEROCHAMBER MV) inhaler Use as instructed 1 each 0  . VENTOLIN HFA 108 (90 Base) MCG/ACT inhaler INHALE 2 PUFFS BY MOUTH EVERY 6 HOURS AS NEEDED FOR WHEEZING OR SHORTNESS OF BREATH 18 g 5  . vitamin B-12 (CYANOCOBALAMIN) 500 MCG tablet Take 500 mcg by mouth daily.     No current facility-administered medications for this visit.     SURGICAL HISTORY:  Past Surgical History:  Procedure Laterality  Date  . APPLICATION OF WOUND VAC  03/19/2013   Procedure: APPLICATION OF WOUND VAC;  Surgeon: Madilyn Hook, DO;  Location: [facility];  Service: General;;  . CESAREAN SECTION  04/12/85  . COLONOSCOPY WITH PROPOFOL N/A 06/13/2016   Procedure: COLONOSCOPY WITH PROPOFOL;  Surgeon: Jerene Bears, MD;  Location: [facility];  Service: Gastroenterology;  Laterality: N/A;  . ERCP N/A 02/15/2017   Procedure: ENDOSCOPIC RETROGRADE CHOLANGIOPANCREATOGRAPHY (ERCP);  Surgeon: [name], MD;  Location: [facility];  Service: Endoscopy;  Laterality: N/A;  . INSERTION OF MESH N/A 03/19/2013   Procedure: INSERTION OF MESH;  Surgeon: Madilyn Hook, DO;  Location: [facility];  Service: General;  Laterality: N/A;  . VENTRAL HERNIA REPAIR N/A 03/19/2013   Procedure:  OPEN VENTRAL HERNIA REPAIR WITH MESH AND APPLICATION OF WOUND VAC;  Surgeon: Madilyn Hook, DO;  Location: [facility];  Service: General;  Laterality: N/A;  . VIDEO BRONCHOSCOPY Bilateral 04/19/2018   Procedure: VIDEO BRONCHOSCOPY WITH FLUORO;  Surgeon: Rigoberto Noel, MD;  [facility];  Service: Cardiopulmonary;  Laterality: Bilateral;    REVIEW OF SYSTEMS:  A comprehensive review of systems was negative except for: Constitutional: positive for fatigue Respiratory: positive for cough and dyspnea on exertion Neurological: positive for headaches   PHYSICAL EXAMINATION: General appearance: alert, cooperative and no distress Head: Normocephalic, without obvious abnormality, atraumatic Neck: no adenopathy, no JVD, supple, symmetrical, trachea midline and thyroid not enlarged, symmetric, no tenderness/mass/nodules Lymph nodes: Cervical, supraclavicular, and axillary nodes normal. Resp: clear to auscultation bilaterally Back: symmetric, no curvature. ROM normal. No CVA tenderness. Cardio: regular rate and rhythm, S1, S2 normal, no murmur, click, rub or gallop GI: soft, non-tender; bowel sounds normal; no masses,  no organomegaly Extremities: extremities  normal, atraumatic, no cyanosis or edema  ECOG PERFORMANCE STATUS: 1 - Symptomatic but completely ambulatory  Blood pressure 107/68, pulse 99, temperature 97.7 F (36.5 C), temperature source Oral, resp. rate 18, height 5\' 1"  (1.549 m), weight 239 lb 9.6 oz (108.7 kg), SpO2 92 %.  LABORATORY DATA: Lab Results  Component Value Date   WBC 9.1 05/20/2018   HGB 13.2 05/20/2018   HCT 41.6 05/20/2018   MCV 93.1 05/20/2018   PLT 262 05/20/2018      Chemistry      Component Value Date/Time   NA 143 05/13/2018 0740   NA 143 11/23/2016 1504   K 3.7 05/13/2018 0740   CL 103 05/13/2018 0740   CO2 29 05/13/2018 0740   BUN 19 05/13/2018 0740   BUN 14 11/23/2016 1504   CREATININE 0.81 05/13/2018 0740      Component Value Date/Time   CALCIUM 9.2 05/13/2018 0740   ALKPHOS 64 05/13/2018 0740   AST 15 05/13/2018 0740   ALT 12  05/13/2018 0740   BILITOT 0.3 05/13/2018 0740       RADIOGRAPHIC STUDIES: Mr Jeri Cos HR Contrast  Result Date: 05/07/2018 CLINICAL DATA:  Non-small cell lung cancer staging EXAM: MRI HEAD WITHOUT AND WITH CONTRAST TECHNIQUE: Multiplanar, multiecho pulse sequences of the brain and surrounding structures were obtained without and with intravenous contrast. CONTRAST:  10 cc Gadavist intravenous COMPARISON:  None. FINDINGS: Brain: No enhancement or swelling to suggest metastatic disease. No infarction, hemorrhage, hydrocephalus, extra-axial collection or mass lesion. Overall mild chronic small vessel ischemia in the cerebral white matter. Partially empty sella, incidental in this clinical setting. Vascular: Normal flow voids. Skull and upper cervical spine: C3-4 facet ankylosis. No evidence of marrow lesion. Sinuses/Orbits: Negative IMPRESSION: No evidence of metastatic disease. Electronically Signed   By: Monte Fantasia M.D.   On: 05/07/2018 09:01    ASSESSMENT AND PLAN: This is a very pleasant 62 years old white female with a stage IIIb non-small cell lung cancer,  squamous cell carcinoma and currently undergoing a course of concurrent chemoradiation with weekly carboplatin and paclitaxel.  She tolerated the first week of her treatment fairly well. She had MRI of the brain performed recently that showed no evidence of metastatic disease to the brain.  I discussed the MRI results with the patient and her husband. I recommended for the patient to proceed with cycle #2 today as a schedule. I will see her back for follow-up visit in 2 weeks for evaluation and management of any adverse effect of her treatment. The patient was advised to call immediately if she has any concerning symptoms in the interval. The patient voices understanding of current disease status and treatment options and is in agreement with the current care plan.  All questions were answered. The patient knows to call the clinic with any problems, questions or concerns. We can certainly see the patient much sooner if necessary.  I spent 10 minutes counseling the patient face to face. The total time spent in the appointment was 15 minutes.  Disclaimer: This note was dictated with voice recognition software. Similar sounding words can inadvertently be transcribed and may not be corrected upon review.

## 2018-05-20 NOTE — Patient Instructions (Signed)
   [facility] Discharge Instructions for Patients Receiving Chemotherapy  Today you received the following chemotherapy agents Taxol and Carboplatin   To help prevent nausea and vomiting after your treatment, we encourage you to take your nausea medication as directed.    If you develop nausea and vomiting that is not controlled by your nausea medication, call the clinic.   BELOW ARE SYMPTOMS THAT SHOULD BE REPORTED IMMEDIATELY:  *FEVER GREATER THAN 100.5 F  *CHILLS WITH OR WITHOUT FEVER  NAUSEA AND VOMITING THAT IS NOT CONTROLLED WITH YOUR NAUSEA MEDICATION  *UNUSUAL SHORTNESS OF BREATH  *UNUSUAL BRUISING OR BLEEDING  TENDERNESS IN MOUTH AND THROAT WITH OR WITHOUT PRESENCE OF ULCERS  *URINARY PROBLEMS  *BOWEL PROBLEMS  UNUSUAL RASH Items with * indicate a potential emergency and should be followed up as soon as possible.  Feel free to call the clinic should you have any questions or concerns. The clinic phone number is [phone].  Please show the CHEMO ALERT CARD at check-in to the Emergency Department and triage nurse.   

## 2018-05-20 NOTE — Progress Notes (Signed)
Went to infusion to introduce myself as Arboriculturist and to offer my card for any financial questions or concerns.  Discussed briefly the one-time $500 [place] to assist with transportation in forms of gas cards;etc. Advised proof of household income is needed to apply. She verbalized understanding.

## 2018-05-21 ENCOUNTER — Ambulatory Visit
Admission: RE | Admit: 2018-05-21 | Discharge: 2018-05-21 | Disposition: A | Discharge status: Home / Self Care | Payer: 59 | Source: Ambulatory Visit | Attending: Radiation Oncology | Admitting: Radiation Oncology

## 2018-05-21 DIAGNOSIS — Z510 Encounter for antineoplastic radiation therapy: Secondary | ICD-10-CM | POA: Diagnosis not present

## 2018-05-22 ENCOUNTER — Ambulatory Visit
Admission: RE | Admit: 2018-05-22 | Discharge: 2018-05-22 | Disposition: A | Discharge status: Home / Self Care | Payer: 59 | Source: Ambulatory Visit | Attending: Radiation Oncology | Admitting: Radiation Oncology

## 2018-05-22 DIAGNOSIS — Z510 Encounter for antineoplastic radiation therapy: Secondary | ICD-10-CM | POA: Diagnosis not present

## 2018-05-22 LAB — FUNGUS CULTURE WITH STAIN

## 2018-05-22 LAB — FUNGUS CULTURE RESULT

## 2018-05-22 LAB — FUNGAL ORGANISM REFLEX

## 2018-05-23 ENCOUNTER — Ambulatory Visit
Admission: RE | Admit: 2018-05-23 | Discharge: 2018-05-23 | Disposition: A | Discharge status: Home / Self Care | Payer: 59 | Source: Ambulatory Visit | Attending: Radiation Oncology | Admitting: Radiation Oncology

## 2018-05-23 DIAGNOSIS — Z510 Encounter for antineoplastic radiation therapy: Secondary | ICD-10-CM | POA: Diagnosis not present

## 2018-05-24 ENCOUNTER — Ambulatory Visit
Admission: RE | Admit: 2018-05-24 | Discharge: 2018-05-24 | Disposition: A | Discharge status: Home / Self Care | Payer: 59 | Source: Ambulatory Visit | Attending: Radiation Oncology | Admitting: Radiation Oncology

## 2018-05-24 ENCOUNTER — Telehealth: Payer: Self-pay | Admitting: Medical Oncology

## 2018-05-24 DIAGNOSIS — Z510 Encounter for antineoplastic radiation therapy: Secondary | ICD-10-CM | POA: Diagnosis not present

## 2018-05-24 NOTE — Telephone Encounter (Signed)
Only complaint is very tired and insomnia . I told pt to hydrate with extra fluids today and to try Benadryl hs for sleep.

## 2018-05-27 ENCOUNTER — Inpatient Hospital Stay: Payer: 59

## 2018-05-27 ENCOUNTER — Ambulatory Visit
Admission: RE | Admit: 2018-05-27 | Discharge: 2018-05-27 | Disposition: A | Discharge status: Home / Self Care | Payer: 59 | Source: Ambulatory Visit | Attending: Radiation Oncology | Admitting: Radiation Oncology

## 2018-05-27 VITALS — BP 111/66 | HR 100 | Temp 98.7°F | Resp 20

## 2018-05-27 DIAGNOSIS — Z510 Encounter for antineoplastic radiation therapy: Secondary | ICD-10-CM | POA: Diagnosis not present

## 2018-05-27 DIAGNOSIS — C3492 Malignant neoplasm of unspecified part of left bronchus or lung: Secondary | ICD-10-CM

## 2018-05-27 DIAGNOSIS — Z5111 Encounter for antineoplastic chemotherapy: Secondary | ICD-10-CM | POA: Diagnosis not present

## 2018-05-27 LAB — CMP (CANCER CENTER ONLY)
ALT: 34 U/L (ref 0–44)
AST: 15 U/L (ref 15–41)
Albumin: 2.7 g/dL — ABNORMAL LOW (ref 3.5–5.0)
Alkaline Phosphatase: 82 U/L (ref 38–126)
Anion gap: 10 (ref 5–15)
BUN: 13 mg/dL (ref 6–20)
CO2: 26 mmol/L (ref 22–32)
Calcium: 9.2 mg/dL (ref 8.9–10.3)
Chloride: 103 mmol/L (ref 98–111)
Creatinine: 0.8 mg/dL (ref 0.44–1.00)
GFR, Est AFR Am: >60 mL/min (ref >60)
GFR, Estimated: >60 mL/min (ref >60)
Glucose, Bld: 100 mg/dL — ABNORMAL HIGH (ref 70–99)
Potassium: 3.9 mmol/L (ref 3.5–5.1)
Sodium: 139 mmol/L (ref 135–145)
Total Bilirubin: 0.6 mg/dL (ref 0.3–1.2)
Total Protein: 6.9 g/dL (ref 6.5–8.1)

## 2018-05-27 LAB — CBC WITH DIFFERENTIAL (CANCER CENTER ONLY)
Abs Immature Granulocytes: 0.17 10*3/uL — ABNORMAL HIGH (ref 0.00–0.07)
Basophils Absolute: 0 10*3/uL (ref 0.0–0.1)
Basophils Relative: 0 %
Eosinophils Absolute: 0.1 10*3/uL (ref 0.0–0.5)
Eosinophils Relative: 1 %
HCT: 38.9 % (ref 36.0–46.0)
Hemoglobin: 12.6 g/dL (ref 12.0–15.0)
Immature Granulocytes: 2 %
Lymphocytes Relative: 6 %
Lymphs Abs: 0.6 10*3/uL — ABNORMAL LOW (ref 0.7–4.0)
MCH: 30.1 pg (ref 26.0–34.0)
MCHC: 32.4 g/dL (ref 30.0–36.0)
MCV: 93.1 fL (ref 80.0–100.0)
Monocytes Absolute: 1.1 10*3/uL — ABNORMAL HIGH (ref 0.1–1.0)
Monocytes Relative: 10 %
Neutro Abs: 9.2 10*3/uL — ABNORMAL HIGH (ref 1.7–7.7)
Neutrophils Relative %: 81 %
Platelet Count: 250 10*3/uL (ref 150–400)
RBC: 4.18 MIL/uL (ref 3.87–5.11)
RDW: 13.4 % (ref 11.5–15.5)
WBC Count: 11.2 10*3/uL — ABNORMAL HIGH (ref 4.0–10.5)
nRBC: 0 % (ref 0.0–0.2)

## 2018-05-27 LAB — RESEARCH LABS

## 2018-05-27 MED ORDER — PALONOSETRON HCL INJECTION 0.25 MG/5ML
0.2500 mg | Freq: Once | INTRAVENOUS | Status: AC
  Administered 2018-05-27: 0.25 mg via INTRAVENOUS

## 2018-05-27 MED ORDER — FAMOTIDINE IN NACL 20-0.9 MG/50ML-% IV SOLN: 20.0000 mg | Freq: Once | INTRAVENOUS | Status: DC

## 2018-05-27 MED ORDER — SODIUM CHLORIDE 0.9 % IV SOLN
20.0000 mg | Freq: Once | INTRAVENOUS | Status: AC
  Administered 2018-05-27: 20 mg via INTRAVENOUS
  Filled 2018-05-27: qty 2

## 2018-05-27 MED ORDER — SODIUM CHLORIDE 0.9 % IV SOLN
45.0000 mg/m2 | Freq: Once | INTRAVENOUS | Status: AC
  Administered 2018-05-27: 96 mg via INTRAVENOUS
  Filled 2018-05-27: qty 16

## 2018-05-27 MED ORDER — SODIUM CHLORIDE 0.9 % IV SOLN
20.0000 mg | Freq: Once | INTRAVENOUS | Status: DC
  Filled 2018-05-27: qty 2

## 2018-05-27 MED ORDER — SODIUM CHLORIDE 0.9 % IV SOLN
Freq: Once | INTRAVENOUS | Status: AC
  Administered 2018-05-27: 14:00:00 via INTRAVENOUS
  Filled 2018-05-27: qty 250

## 2018-05-27 MED ORDER — PALONOSETRON HCL INJECTION 0.25 MG/5ML
INTRAVENOUS | Status: AC
  Filled 2018-05-27: qty 5

## 2018-05-27 MED ORDER — DIPHENHYDRAMINE HCL 25 MG PO CAPS
ORAL_CAPSULE | ORAL | Status: AC
  Filled 2018-05-27: qty 1

## 2018-05-27 MED ORDER — SODIUM CHLORIDE 0.9 % IV SOLN
300.0000 mg | Freq: Once | INTRAVENOUS | Status: AC
  Administered 2018-05-27: 300 mg via INTRAVENOUS
  Filled 2018-05-27: qty 30

## 2018-05-27 MED ORDER — DIPHENHYDRAMINE HCL 25 MG PO TABS
25.0000 mg | ORAL_TABLET | Freq: Once | ORAL | Status: AC
  Administered 2018-05-27: 25 mg via ORAL
  Filled 2018-05-27: qty 1

## 2018-05-27 NOTE — Progress Notes (Signed)
Pt prefers PO Benadryl (RLS); MD ok'd dose reduction as well. Kennith Center, Pharm.D., CPP 05/27/2018@1 :20 PM

## 2018-05-27 NOTE — Patient Instructions (Signed)
   [facility] Discharge Instructions for Patients Receiving Chemotherapy  Today you received the following chemotherapy agents Taxol and Carboplatin   To help prevent nausea and vomiting after your treatment, we encourage you to take your nausea medication as directed.    If you develop nausea and vomiting that is not controlled by your nausea medication, call the clinic.   BELOW ARE SYMPTOMS THAT SHOULD BE REPORTED IMMEDIATELY:  *FEVER GREATER THAN 100.5 F  *CHILLS WITH OR WITHOUT FEVER  NAUSEA AND VOMITING THAT IS NOT CONTROLLED WITH YOUR NAUSEA MEDICATION  *UNUSUAL SHORTNESS OF BREATH  *UNUSUAL BRUISING OR BLEEDING  TENDERNESS IN MOUTH AND THROAT WITH OR WITHOUT PRESENCE OF ULCERS  *URINARY PROBLEMS  *BOWEL PROBLEMS  UNUSUAL RASH Items with * indicate a potential emergency and should be followed up as soon as possible.  Feel free to call the clinic should you have any questions or concerns. The clinic phone number is [phone].  Please show the CHEMO ALERT CARD at check-in to the Emergency Department and triage nurse.   

## 2018-05-28 ENCOUNTER — Ambulatory Visit: Payer: Self-pay | Admitting: Pulmonary Disease

## 2018-05-28 ENCOUNTER — Ambulatory Visit
Admission: RE | Admit: 2018-05-28 | Discharge: 2018-05-28 | Disposition: A | Discharge status: Home / Self Care | Payer: 59 | Source: Ambulatory Visit | Attending: Radiation Oncology | Admitting: Radiation Oncology

## 2018-05-28 DIAGNOSIS — Z510 Encounter for antineoplastic radiation therapy: Secondary | ICD-10-CM | POA: Diagnosis not present

## 2018-05-29 ENCOUNTER — Ambulatory Visit
Admission: RE | Admit: 2018-05-29 | Discharge: 2018-05-29 | Disposition: A | Discharge status: Home / Self Care | Payer: 59 | Source: Ambulatory Visit | Attending: Radiation Oncology | Admitting: Radiation Oncology

## 2018-05-29 DIAGNOSIS — Z510 Encounter for antineoplastic radiation therapy: Secondary | ICD-10-CM | POA: Diagnosis not present

## 2018-05-30 ENCOUNTER — Ambulatory Visit
Admission: RE | Admit: 2018-05-30 | Discharge: 2018-05-30 | Disposition: A | Discharge status: Home / Self Care | Payer: 59 | Source: Ambulatory Visit | Attending: Radiation Oncology | Admitting: Radiation Oncology

## 2018-05-30 DIAGNOSIS — Z510 Encounter for antineoplastic radiation therapy: Secondary | ICD-10-CM | POA: Diagnosis not present

## 2018-05-31 ENCOUNTER — Ambulatory Visit
Admission: RE | Admit: 2018-05-31 | Discharge: 2018-05-31 | Disposition: A | Discharge status: Home / Self Care | Payer: 59 | Source: Ambulatory Visit | Attending: Radiation Oncology | Admitting: Radiation Oncology

## 2018-05-31 DIAGNOSIS — Z510 Encounter for antineoplastic radiation therapy: Secondary | ICD-10-CM | POA: Diagnosis not present

## 2018-06-02 LAB — ACID FAST CULTURE WITH REFLEXED SENSITIVITIES (MYCOBACTERIA): Acid Fast Culture: NEGATIVE

## 2018-06-03 ENCOUNTER — Inpatient Hospital Stay: Payer: 59

## 2018-06-03 ENCOUNTER — Inpatient Hospital Stay (HOSPITAL_BASED_OUTPATIENT_CLINIC_OR_DEPARTMENT_OTHER): Payer: 59 | Admitting: Internal Medicine

## 2018-06-03 ENCOUNTER — Encounter: Payer: Self-pay | Admitting: Internal Medicine

## 2018-06-03 ENCOUNTER — Ambulatory Visit
Admission: RE | Admit: 2018-06-03 | Discharge: 2018-06-03 | Disposition: A | Discharge status: Home / Self Care | Payer: 59 | Source: Ambulatory Visit | Attending: Radiation Oncology | Admitting: Radiation Oncology

## 2018-06-03 VITALS — BP 99/68 | HR 94 | Temp 97.6°F | Resp 19 | Ht 61.0 in | Wt 235.0 lb

## 2018-06-03 DIAGNOSIS — R110 Nausea: Secondary | ICD-10-CM

## 2018-06-03 DIAGNOSIS — J432 Centrilobular emphysema: Secondary | ICD-10-CM

## 2018-06-03 DIAGNOSIS — C3492 Malignant neoplasm of unspecified part of left bronchus or lung: Secondary | ICD-10-CM

## 2018-06-03 DIAGNOSIS — R1310 Dysphagia, unspecified: Secondary | ICD-10-CM | POA: Diagnosis not present

## 2018-06-03 DIAGNOSIS — I10 Essential (primary) hypertension: Secondary | ICD-10-CM | POA: Diagnosis not present

## 2018-06-03 DIAGNOSIS — Z510 Encounter for antineoplastic radiation therapy: Secondary | ICD-10-CM | POA: Diagnosis not present

## 2018-06-03 DIAGNOSIS — C3432 Malignant neoplasm of lower lobe, left bronchus or lung: Secondary | ICD-10-CM

## 2018-06-03 DIAGNOSIS — Z5111 Encounter for antineoplastic chemotherapy: Secondary | ICD-10-CM | POA: Diagnosis not present

## 2018-06-03 DIAGNOSIS — K208 Other esophagitis: Secondary | ICD-10-CM

## 2018-06-03 DIAGNOSIS — Z9981 Dependence on supplemental oxygen: Secondary | ICD-10-CM

## 2018-06-03 LAB — CBC WITH DIFFERENTIAL (CANCER CENTER ONLY)
Abs Immature Granulocytes: 0.04 10*3/uL (ref 0.00–0.07)
Basophils Absolute: 0 10*3/uL (ref 0.0–0.1)
Basophils Relative: 1 %
Eosinophils Absolute: 0.1 10*3/uL (ref 0.0–0.5)
Eosinophils Relative: 1 %
HCT: 34.3 % — ABNORMAL LOW (ref 36.0–46.0)
Hemoglobin: 11 g/dL — ABNORMAL LOW (ref 12.0–15.0)
Immature Granulocytes: 1 %
Lymphocytes Relative: 12 %
Lymphs Abs: 0.6 10*3/uL — ABNORMAL LOW (ref 0.7–4.0)
MCH: 29.7 pg (ref 26.0–34.0)
MCHC: 32.1 g/dL (ref 30.0–36.0)
MCV: 92.7 fL (ref 80.0–100.0)
Monocytes Absolute: 0.6 10*3/uL (ref 0.1–1.0)
Monocytes Relative: 12 %
Neutro Abs: 3.6 10*3/uL (ref 1.7–7.7)
Neutrophils Relative %: 73 %
Platelet Count: 298 10*3/uL (ref 150–400)
RBC: 3.7 MIL/uL — ABNORMAL LOW (ref 3.87–5.11)
RDW: 13.4 % (ref 11.5–15.5)
WBC Count: 4.9 10*3/uL (ref 4.0–10.5)
nRBC: 0 % (ref 0.0–0.2)

## 2018-06-03 LAB — CMP (CANCER CENTER ONLY)
ALT: 40 U/L (ref 0–44)
AST: 32 U/L (ref 15–41)
Albumin: 2.4 g/dL — ABNORMAL LOW (ref 3.5–5.0)
Alkaline Phosphatase: 88 U/L (ref 38–126)
Anion gap: 10 (ref 5–15)
BUN: 16 mg/dL (ref 6–20)
CO2: 27 mmol/L (ref 22–32)
Calcium: 8.7 mg/dL — ABNORMAL LOW (ref 8.9–10.3)
Chloride: 101 mmol/L (ref 98–111)
Creatinine: 0.83 mg/dL (ref 0.44–1.00)
GFR, Est AFR Am: >60 mL/min (ref >60)
GFR, Estimated: >60 mL/min (ref >60)
Glucose, Bld: 90 mg/dL (ref 70–99)
Potassium: 4.1 mmol/L (ref 3.5–5.1)
Sodium: 138 mmol/L (ref 135–145)
Total Bilirubin: 0.2 mg/dL — ABNORMAL LOW (ref 0.3–1.2)
Total Protein: 6.6 g/dL (ref 6.5–8.1)

## 2018-06-03 MED ORDER — SODIUM CHLORIDE 0.9 % IV SOLN
20.0000 mg | Freq: Once | INTRAVENOUS | Status: AC
  Administered 2018-06-03: 20 mg via INTRAVENOUS
  Filled 2018-06-03: qty 2

## 2018-06-03 MED ORDER — SUCRALFATE 1 G PO TABS: 1.0000 g | ORAL_TABLET | Freq: Three times a day (TID) | ORAL | 1 refills | Status: DC

## 2018-06-03 MED ORDER — PALONOSETRON HCL INJECTION 0.25 MG/5ML
INTRAVENOUS | Status: AC
  Filled 2018-06-03: qty 5

## 2018-06-03 MED ORDER — SODIUM CHLORIDE 0.9 % IV SOLN
45.0000 mg/m2 | Freq: Once | INTRAVENOUS | Status: AC
  Administered 2018-06-03: 96 mg via INTRAVENOUS
  Filled 2018-06-03: qty 16

## 2018-06-03 MED ORDER — SODIUM CHLORIDE 0.9 % IV SOLN
300.0000 mg | Freq: Once | INTRAVENOUS | Status: AC
  Administered 2018-06-03: 300 mg via INTRAVENOUS
  Filled 2018-06-03: qty 30

## 2018-06-03 MED ORDER — FAMOTIDINE IN NACL 20-0.9 MG/50ML-% IV SOLN
INTRAVENOUS | Status: AC
  Filled 2018-06-03: qty 50

## 2018-06-03 MED ORDER — SODIUM CHLORIDE 0.9 % IV SOLN
Freq: Once | INTRAVENOUS | Status: AC
  Administered 2018-06-03: 14:00:00 via INTRAVENOUS
  Filled 2018-06-03: qty 250

## 2018-06-03 MED ORDER — FAMOTIDINE IN NACL 20-0.9 MG/50ML-% IV SOLN
20.0000 mg | Freq: Once | INTRAVENOUS | Status: AC
  Administered 2018-06-03: 20 mg via INTRAVENOUS

## 2018-06-03 MED ORDER — DIPHENHYDRAMINE HCL 25 MG PO TABS
25.0000 mg | ORAL_TABLET | Freq: Once | ORAL | Status: AC
  Administered 2018-06-03: 25 mg via ORAL
  Filled 2018-06-03: qty 1

## 2018-06-03 MED ORDER — DIPHENHYDRAMINE HCL 25 MG PO CAPS
ORAL_CAPSULE | ORAL | Status: AC
  Filled 2018-06-03: qty 1

## 2018-06-03 MED ORDER — PALONOSETRON HCL INJECTION 0.25 MG/5ML
0.2500 mg | Freq: Once | INTRAVENOUS | Status: AC
  Administered 2018-06-03: 0.25 mg via INTRAVENOUS

## 2018-06-03 NOTE — Progress Notes (Signed)
[place] Telephone:([phone]   [phone]  OFFICE PROGRESS NOTE  [name], MD [place] [address]  DIAGNOSIS: stage IIIB (T3, N2, M0) non-small cell lung cancer, squamous cell carcinoma presented with large left lower lobe lung mass in addition to mediastinal lymphadenopathy diagnosed in October 2019. PDL 1 expression is negative  PRIOR THERAPY:  CURRENT THERAPY: Concurrent chemoradiation with weekly carboplatin for AUC of 2 and paclitaxel 45 mg/M2.  Status post 3 cycles.  INTERVAL HISTORY: Deborah [name] 61 y.o. female returns to the clinic today for follow-up visit accompanied by her husband.  The patient is feeling fine today with no specific complaints except for the baseline shortness of breath and she is currently on home oxygen.  She also is complaining of sore throat and odynophagia.  She is not on any medication for her odynophagia.  She denied having any chest pain but has mild cough with no hemoptysis.  The patient has mild nausea but no vomiting, diarrhea or constipation.  She lost around 5 pounds since her last visit.  She is here today for evaluation before starting cycle #4.Marland Kitchen  MEDICAL HISTORY: Past Medical History:  Diagnosis Date  . Anemia    as teen  . Asthma   . CHF (congestive heart failure) ([place])   . COPD, mild ([place])   . Depression   . Diverticulitis   . Family history of anesthesia complication    vomiting  . GI bleeding   . Heart failure ([place])    New onset 07/25/14  . Histoplasmosis    left eye  . Hyperkalemia   . Hypertension   . Obesity (BMI 30-39.9)   . Pneumonia    dx wtih pneumonia on 05/27/16- seen by [facility]   . PONV (postoperative nausea and vomiting)   . Restrictive lung disease   . Shortness of breath    with exertion   . Sleep apnea    mask and oxygen at nite for sleep at 2L   . Tobacco abuse   . Umbilical hernia     ALLERGIES:  has No Known Allergies.  MEDICATIONS:   Current Outpatient Medications  Medication Sig Dispense Refill  . acetaminophen (TYLENOL) 500 MG tablet Take 1,500 mg by mouth 2 (two) times daily as needed for moderate pain or headache.    . albuterol (PROVENTIL) (2.5 MG/3ML) 0.083% nebulizer solution USE 1 VIAL VIA NEBULIZER EVERY 3 HOURS AS NEEDED FOR WHEEZING OR SHORTNESS OF BREATH (Patient taking differently: Take 2.5 mg by nebulization every 3 (three) hours as needed for wheezing or shortness of breath. ) 1125 mL 1  . aspirin 81 MG chewable tablet Chew 1 tablet (81 mg total) by mouth daily. 30 tablet 1  . diltiazem (CARDIZEM CD) 120 MG 24 hr capsule TAKE 1 CAPSULE BY MOUTH EVERY DAY 90 capsule 1  . Fluticasone-Umeclidin-Vilant (TRELEGY ELLIPTA) 100-62.5-25 MCG/INH AEPB Inhale 1 puff into the lungs daily. 1 each 5  . furosemide (LASIX) 20 MG tablet TAKE 2 TABLETS EACH MORNING AND 1 AT 3 PM 270 tablet 0  . ipratropium-albuterol (DUONEB) 0.5-2.5 (3) MG/3ML SOLN USE 1 VIAL VIA NEBULIZER EVERY 6 HOURS AS NEEDED (Patient taking differently: Inhale 3 mLs into the lungs every 6 (six) hours as needed (shortness of breath or wheezing). ) 360 mL 2  . montelukast (SINGULAIR) 10 MG tablet TAKE 1 TABLET(10 MG) BY MOUTH AT BEDTIME 90 tablet 1  . OXYGEN Inhale 4  L/min into the lungs continuous.     . potassium chloride SA (KLOR-CON M20) 20 MEQ tablet TAKE 1/2 TABLET(10 MEQ) BY MOUTH DAILY 45 tablet 1  . prochlorperazine (COMPAZINE) 10 MG tablet Take 1 tablet (10 mg total) by mouth every 6 (six) hours as needed for nausea or vomiting. 30 tablet 0  . Spacer/Aero-Holding Chambers (AEROCHAMBER MV) inhaler Use as instructed 1 each 0  . VENTOLIN HFA 108 (90 Base) MCG/ACT inhaler INHALE 2 PUFFS BY MOUTH EVERY 6 HOURS AS NEEDED FOR WHEEZING OR SHORTNESS OF BREATH 18 g 5  . vitamin B-12 (CYANOCOBALAMIN) 500 MCG tablet Take 500 mcg by mouth daily.     No current facility-administered medications for this visit.     SURGICAL HISTORY:  Past Surgical History:    Procedure Laterality Date  . APPLICATION OF WOUND VAC  03/19/2013   Procedure: APPLICATION OF WOUND VAC;  Surgeon: Madilyn Hook, DO;  Location: [facility];  Service: General;;  . CESAREAN SECTION  04/12/85  . COLONOSCOPY WITH PROPOFOL N/A 06/13/2016   Procedure: COLONOSCOPY WITH PROPOFOL;  Surgeon: Jerene Bears, MD;  Location: [facility];  Service: Gastroenterology;  Laterality: N/A;  . ERCP N/A 02/15/2017   Procedure: ENDOSCOPIC RETROGRADE CHOLANGIOPANCREATOGRAPHY (ERCP);  Surgeon: [name], MD;  Location: [facility];  Service: Endoscopy;  Laterality: N/A;  . INSERTION OF MESH N/A 03/19/2013   Procedure: INSERTION OF MESH;  Surgeon: Madilyn Hook, DO;  Location: [facility];  Service: General;  Laterality: N/A;  . VENTRAL HERNIA REPAIR N/A 03/19/2013   Procedure:  OPEN VENTRAL HERNIA REPAIR WITH MESH AND APPLICATION OF WOUND VAC;  Surgeon: Madilyn Hook, DO;  Location: [facility];  Service: General;  Laterality: N/A;  . VIDEO BRONCHOSCOPY Bilateral 04/19/2018   Procedure: VIDEO BRONCHOSCOPY WITH FLUORO;  Surgeon: Rigoberto Noel, MD;  [facility];  Service: Cardiopulmonary;  Laterality: Bilateral;    REVIEW OF SYSTEMS:  A comprehensive review of systems was negative except for: Constitutional: positive for fatigue and weight loss Respiratory: positive for cough and dyspnea on exertion Gastrointestinal: positive for odynophagia   PHYSICAL EXAMINATION: General appearance: alert, cooperative and no distress Head: Normocephalic, without obvious abnormality, atraumatic Neck: no adenopathy, no JVD, supple, symmetrical, trachea midline and thyroid not enlarged, symmetric, no tenderness/mass/nodules Lymph nodes: Cervical, supraclavicular, and axillary nodes normal. Resp: clear to auscultation bilaterally Back: symmetric, no curvature. ROM normal. No CVA tenderness. Cardio: regular rate and rhythm, S1, S2 normal, no murmur, click, rub or gallop GI: soft, non-tender; bowel sounds normal; no masses,   no organomegaly Extremities: extremities normal, atraumatic, no cyanosis or edema  ECOG PERFORMANCE STATUS: 1 - Symptomatic but completely ambulatory  Blood pressure 99/68, pulse 94, temperature 97.6 F (36.4 C), temperature source Oral, resp. rate 19, height 5\' 1"  (1.549 m), weight 235 lb (106.6 kg), SpO2 97 %.  LABORATORY DATA: Lab Results  Component Value Date   WBC 4.9 06/03/2018   HGB 11.0 (L) 06/03/2018   HCT 34.3 (L) 06/03/2018   MCV 92.7 06/03/2018   PLT 298 06/03/2018      Chemistry      Component Value Date/Time   NA 139 05/27/2018 1218   NA 143 11/23/2016 1504   K 3.9 05/27/2018 1218   CL 103 05/27/2018 1218   CO2 26 05/27/2018 1218   BUN 13 05/27/2018 1218   BUN 14 11/23/2016 1504   CREATININE 0.80 05/27/2018 1218      Component Value Date/Time   CALCIUM 9.2 05/27/2018 1218  ALKPHOS 82 05/27/2018 1218   AST 15 05/27/2018 1218   ALT 34 05/27/2018 1218   BILITOT 0.6 05/27/2018 1218       RADIOGRAPHIC STUDIES: Mr Jeri Cos PV Contrast  Result Date: 05/07/2018 CLINICAL DATA:  Non-small cell lung cancer staging EXAM: MRI HEAD WITHOUT AND WITH CONTRAST TECHNIQUE: Multiplanar, multiecho pulse sequences of the brain and surrounding structures were obtained without and with intravenous contrast. CONTRAST:  10 cc Gadavist intravenous COMPARISON:  None. FINDINGS: Brain: No enhancement or swelling to suggest metastatic disease. No infarction, hemorrhage, hydrocephalus, extra-axial collection or mass lesion. Overall mild chronic small vessel ischemia in the cerebral white matter. Partially empty sella, incidental in this clinical setting. Vascular: Normal flow voids. Skull and upper cervical spine: C3-4 facet ankylosis. No evidence of marrow lesion. Sinuses/Orbits: Negative IMPRESSION: No evidence of metastatic disease. Electronically Signed   By: Monte Fantasia M.D.   On: 05/07/2018 09:01    ASSESSMENT AND PLAN: This is a very pleasant 62 years old white female with  stage IIIb non-small cell lung cancer, squamous cell carcinoma and currently undergoing a course of concurrent chemoradiation with weekly carboplatin and paclitaxel, status post 3 cycles.   The patient continues to tolerate this treatment well except for the odynophagia secondary to radiation-induced esophagitis. I recommended for her to proceed with cycle #4 today as a schedule. For the odynophagia, I will start the patient on Carafate 1 g p.o. 4 times daily.  She will also discuss with radiation oncology modification of her radiotherapy treatment. The patient will come back for follow-up visit in 2 weeks for evaluation before starting cycle #6. She was advised to call immediately if she has any concerning symptoms in the interval. The patient voices understanding of current disease status and treatment options and is in agreement with the current care plan.  All questions were answered. The patient knows to call the clinic with any problems, questions or concerns. We can certainly see the patient much sooner if necessary.  I spent 10 minutes counseling the patient face to face. The total time spent in the appointment was 15 minutes.  Disclaimer: This note was dictated with voice recognition software. Similar sounding words can inadvertently be transcribed and may not be corrected upon review.

## 2018-06-04 ENCOUNTER — Ambulatory Visit: Payer: Self-pay | Admitting: Cardiovascular Disease

## 2018-06-04 ENCOUNTER — Ambulatory Visit
Admission: RE | Admit: 2018-06-04 | Discharge: 2018-06-04 | Disposition: A | Discharge status: Home / Self Care | Payer: 59 | Source: Ambulatory Visit | Attending: Radiation Oncology | Admitting: Radiation Oncology

## 2018-06-04 ENCOUNTER — Telehealth: Payer: Self-pay | Admitting: Internal Medicine

## 2018-06-04 DIAGNOSIS — Z510 Encounter for antineoplastic radiation therapy: Secondary | ICD-10-CM | POA: Diagnosis not present

## 2018-06-04 NOTE — Telephone Encounter (Signed)
Appts already scheduled per 11/18 los - no additional appts added.

## 2018-06-05 ENCOUNTER — Ambulatory Visit
Admission: RE | Admit: 2018-06-05 | Discharge: 2018-06-05 | Disposition: A | Discharge status: Home / Self Care | Payer: 59 | Source: Ambulatory Visit | Attending: Radiation Oncology | Admitting: Radiation Oncology

## 2018-06-05 DIAGNOSIS — Z510 Encounter for antineoplastic radiation therapy: Secondary | ICD-10-CM | POA: Diagnosis not present

## 2018-06-06 ENCOUNTER — Ambulatory Visit
Admission: RE | Admit: 2018-06-06 | Discharge: 2018-06-06 | Disposition: A | Discharge status: Home / Self Care | Payer: 59 | Source: Ambulatory Visit | Attending: Radiation Oncology | Admitting: Radiation Oncology

## 2018-06-06 DIAGNOSIS — Z510 Encounter for antineoplastic radiation therapy: Secondary | ICD-10-CM | POA: Diagnosis not present

## 2018-06-07 ENCOUNTER — Ambulatory Visit
Admission: RE | Admit: 2018-06-07 | Discharge: 2018-06-07 | Disposition: A | Discharge status: Home / Self Care | Payer: 59 | Source: Ambulatory Visit | Attending: Radiation Oncology | Admitting: Radiation Oncology

## 2018-06-07 DIAGNOSIS — Z510 Encounter for antineoplastic radiation therapy: Secondary | ICD-10-CM | POA: Diagnosis not present

## 2018-06-08 NOTE — Progress Notes (Signed)
  Radiation Oncology         [phone] ________________________________  Name: DACY ENRICO MRN: [id number]  Date: 05/07/2018  DOB: 10-Nov-1955  SIMULATION AND TREATMENT PLANNING NOTE  DIAGNOSIS:     ICD-10-CM   1. Primary malignant neoplasm of bronchus of left lower lobe (HCC) C34.32      Site:  chest  NARRATIVE:  The patient was brought to the [facility].  Identity was confirmed.  All relevant records and images related to the planned course of therapy were reviewed.   Written consent to proceed with treatment was confirmed which was freely given after reviewing the details related to the planned course of therapy had been reviewed with the patient.  Then, the patient was set-up in a stable reproducible  supine position for radiation therapy.  CT images were obtained.  Surface markings were placed.    Medically necessary complex treatment device(s) for immobilization:  Vac-lock bag.   The CT images were loaded into the planning software.  Then the target and avoidance structures were contoured.  Treatment planning then occurred.  The radiation prescription was entered and confirmed.  A total of 5 complex treatment devices were fabricated which relate to the designed radiation treatment fields. Additional reduced fields will be used as necessary to improve the dose homogeneity of the plan. Each of these customized fields/ complex treatment devices will be used on a daily basis during the radiation course. I have requested : 3D Simulation  I have requested a DVH of the following structures: target volume, spinal cord, lungs, heart.   The patient will undergo daily image guidance to ensure accurate localization of the target, and adequate minimize dose to the normal surrounding structures in close proximity to the target.  PLAN:  The patient will receive 60 Gy in 30 fractions initially. The patient will then receive a 6 Gy boost for a final dose of 66 Gy.  Special  treatment procedure The patient will also receive concurrent chemotherapy during the treatment. The patient may therefore experience increased toxicity or side effects and the patient will be monitored for such problems. This may require extra lab work as necessary. This therefore constitutes a special treatment procedure.   ________________________________   Jodelle Gross, MD, PhD

## 2018-06-08 NOTE — Progress Notes (Signed)
  Radiation Oncology         [phone] ________________________________  Name: Deborah [name] MRN: [id number]  Date: 05/07/2018  DOB: [birth year]  RESPIRATORY MOTION MANAGEMENT SIMULATION  NARRATIVE:  In order to account for effect of respiratory motion on target structures and other organs in the planning and delivery of radiotherapy, this patient underwent respiratory motion management simulation.  To accomplish this, when the patient was brought to the CT simulation planning suite, 4D respiratoy motion management CT images were obtained.  The CT images were loaded into the planning software.  Then, using a variety of tools including Cine, MIP, and standard views, the target volume and planning target volumes (PTV) were delineated.  Avoidance structures were contoured.  Treatment planning then occurred.  Dose volume histograms were generated and reviewed for each of the requested structure.  The resulting plan was carefully reviewed and approved today.   ------------------------------------------------  Jodelle Gross, MD, PhD

## 2018-06-09 ENCOUNTER — Ambulatory Visit
Admission: RE | Admit: 2018-06-09 | Discharge: 2018-06-09 | Disposition: A | Discharge status: Home / Self Care | Payer: 59 | Source: Ambulatory Visit | Attending: Radiation Oncology | Admitting: Radiation Oncology

## 2018-06-09 DIAGNOSIS — Z510 Encounter for antineoplastic radiation therapy: Secondary | ICD-10-CM | POA: Diagnosis not present

## 2018-06-10 ENCOUNTER — Inpatient Hospital Stay: Payer: 59

## 2018-06-10 ENCOUNTER — Ambulatory Visit
Admission: RE | Admit: 2018-06-10 | Discharge: 2018-06-10 | Disposition: A | Discharge status: Home / Self Care | Payer: 59 | Source: Ambulatory Visit | Attending: Radiation Oncology | Admitting: Radiation Oncology

## 2018-06-10 VITALS — BP 96/58 | HR 95 | Temp 98.1°F | Resp 20

## 2018-06-10 DIAGNOSIS — Z5111 Encounter for antineoplastic chemotherapy: Secondary | ICD-10-CM | POA: Diagnosis not present

## 2018-06-10 DIAGNOSIS — C3492 Malignant neoplasm of unspecified part of left bronchus or lung: Secondary | ICD-10-CM

## 2018-06-10 DIAGNOSIS — C3432 Malignant neoplasm of lower lobe, left bronchus or lung: Secondary | ICD-10-CM

## 2018-06-10 DIAGNOSIS — Z510 Encounter for antineoplastic radiation therapy: Secondary | ICD-10-CM | POA: Diagnosis not present

## 2018-06-10 LAB — CMP (CANCER CENTER ONLY)
ALT: 20 U/L (ref 0–44)
AST: 17 U/L (ref 15–41)
Albumin: 2.5 g/dL — ABNORMAL LOW (ref 3.5–5.0)
Alkaline Phosphatase: 95 U/L (ref 38–126)
Anion gap: 11 (ref 5–15)
BUN: 13 mg/dL (ref 6–20)
CO2: 30 mmol/L (ref 22–32)
Calcium: 8.8 mg/dL — ABNORMAL LOW (ref 8.9–10.3)
Chloride: 100 mmol/L (ref 98–111)
Creatinine: 0.78 mg/dL (ref 0.44–1.00)
GFR, Est AFR Am: >60 mL/min (ref >60)
GFR, Estimated: >60 mL/min (ref >60)
Glucose, Bld: 77 mg/dL (ref 70–99)
Potassium: 3.9 mmol/L (ref 3.5–5.1)
Sodium: 141 mmol/L (ref 135–145)
Total Bilirubin: <0.2 mg/dL — ABNORMAL LOW (ref 0.3–1.2)
Total Protein: 6.7 g/dL (ref 6.5–8.1)

## 2018-06-10 LAB — CBC WITH DIFFERENTIAL (CANCER CENTER ONLY)
Abs Immature Granulocytes: 0.07 10*3/uL (ref 0.00–0.07)
Basophils Absolute: 0 10*3/uL (ref 0.0–0.1)
Basophils Relative: 1 %
Eosinophils Absolute: 0.1 10*3/uL (ref 0.0–0.5)
Eosinophils Relative: 1 %
HCT: 34.4 % — ABNORMAL LOW (ref 36.0–46.0)
Hemoglobin: 10.8 g/dL — ABNORMAL LOW (ref 12.0–15.0)
Immature Granulocytes: 1 %
Lymphocytes Relative: 11 %
Lymphs Abs: 0.6 10*3/uL — ABNORMAL LOW (ref 0.7–4.0)
MCH: 29.7 pg (ref 26.0–34.0)
MCHC: 31.4 g/dL (ref 30.0–36.0)
MCV: 94.5 fL (ref 80.0–100.0)
Monocytes Absolute: 0.7 10*3/uL (ref 0.1–1.0)
Monocytes Relative: 13 %
Neutro Abs: 3.5 10*3/uL (ref 1.7–7.7)
Neutrophils Relative %: 73 %
Platelet Count: 255 10*3/uL (ref 150–400)
RBC: 3.64 MIL/uL — ABNORMAL LOW (ref 3.87–5.11)
RDW: 13.9 % (ref 11.5–15.5)
WBC Count: 4.9 10*3/uL (ref 4.0–10.5)
nRBC: 0 % (ref 0.0–0.2)

## 2018-06-10 MED ORDER — SODIUM CHLORIDE 0.9 % IV SOLN
300.0000 mg | Freq: Once | INTRAVENOUS | Status: AC
  Administered 2018-06-10: 300 mg via INTRAVENOUS
  Filled 2018-06-10: qty 30

## 2018-06-10 MED ORDER — DIPHENHYDRAMINE HCL 25 MG PO CAPS
ORAL_CAPSULE | ORAL | Status: AC
  Filled 2018-06-10: qty 1

## 2018-06-10 MED ORDER — DIPHENHYDRAMINE HCL 50 MG/ML IJ SOLN
INTRAMUSCULAR | Status: AC
  Filled 2018-06-10: qty 1

## 2018-06-10 MED ORDER — HEPARIN SOD (PORK) LOCK FLUSH 100 UNIT/ML IV SOLN
500.0000 [IU] | Freq: Once | INTRAVENOUS | Status: DC | PRN
  Filled 2018-06-10: qty 5

## 2018-06-10 MED ORDER — FAMOTIDINE IN NACL 20-0.9 MG/50ML-% IV SOLN
INTRAVENOUS | Status: AC
  Filled 2018-06-10: qty 50

## 2018-06-10 MED ORDER — FAMOTIDINE IN NACL 20-0.9 MG/50ML-% IV SOLN
20.0000 mg | Freq: Once | INTRAVENOUS | Status: AC
  Administered 2018-06-10: 20 mg via INTRAVENOUS

## 2018-06-10 MED ORDER — SODIUM CHLORIDE 0.9 % IV SOLN
Freq: Once | INTRAVENOUS | Status: AC
  Administered 2018-06-10: 12:00:00 via INTRAVENOUS
  Filled 2018-06-10: qty 250

## 2018-06-10 MED ORDER — PALONOSETRON HCL INJECTION 0.25 MG/5ML
INTRAVENOUS | Status: AC
  Filled 2018-06-10: qty 5

## 2018-06-10 MED ORDER — DIPHENHYDRAMINE HCL 25 MG PO TABS
25.0000 mg | ORAL_TABLET | Freq: Once | ORAL | Status: AC
  Administered 2018-06-10: 25 mg via ORAL
  Filled 2018-06-10: qty 1

## 2018-06-10 MED ORDER — SODIUM CHLORIDE 0.9 % IV SOLN
45.0000 mg/m2 | Freq: Once | INTRAVENOUS | Status: AC
  Administered 2018-06-10: 96 mg via INTRAVENOUS
  Filled 2018-06-10: qty 16

## 2018-06-10 MED ORDER — SODIUM CHLORIDE 0.9% FLUSH
10.0000 mL | INTRAVENOUS | Status: DC | PRN
  Filled 2018-06-10: qty 10

## 2018-06-10 MED ORDER — PALONOSETRON HCL INJECTION 0.25 MG/5ML
0.2500 mg | Freq: Once | INTRAVENOUS | Status: AC
  Administered 2018-06-10: 0.25 mg via INTRAVENOUS

## 2018-06-10 MED ORDER — SODIUM CHLORIDE 0.9 % IV SOLN
20.0000 mg | Freq: Once | INTRAVENOUS | Status: AC
  Administered 2018-06-10: 20 mg via INTRAVENOUS
  Filled 2018-06-10: qty 2

## 2018-06-10 NOTE — Progress Notes (Signed)
Per Dr. Julien Nordmann, ok to treat with BP 96/58

## 2018-06-10 NOTE — Patient Instructions (Signed)
   [facility] Discharge Instructions for Patients Receiving Chemotherapy  Today you received the following chemotherapy agents Taxol and Carboplatin   To help prevent nausea and vomiting after your treatment, we encourage you to take your nausea medication as directed.    If you develop nausea and vomiting that is not controlled by your nausea medication, call the clinic.   BELOW ARE SYMPTOMS THAT SHOULD BE REPORTED IMMEDIATELY:  *FEVER GREATER THAN 100.5 F  *CHILLS WITH OR WITHOUT FEVER  NAUSEA AND VOMITING THAT IS NOT CONTROLLED WITH YOUR NAUSEA MEDICATION  *UNUSUAL SHORTNESS OF BREATH  *UNUSUAL BRUISING OR BLEEDING  TENDERNESS IN MOUTH AND THROAT WITH OR WITHOUT PRESENCE OF ULCERS  *URINARY PROBLEMS  *BOWEL PROBLEMS  UNUSUAL RASH Items with * indicate a potential emergency and should be followed up as soon as possible.  Feel free to call the clinic should you have any questions or concerns. The clinic phone number is [phone].  Please show the CHEMO ALERT CARD at check-in to the Emergency Department and triage nurse.   

## 2018-06-11 ENCOUNTER — Ambulatory Visit
Admission: RE | Admit: 2018-06-11 | Discharge: 2018-06-11 | Disposition: A | Discharge status: Home / Self Care | Payer: 59 | Source: Ambulatory Visit | Attending: Radiation Oncology | Admitting: Radiation Oncology

## 2018-06-11 DIAGNOSIS — Z510 Encounter for antineoplastic radiation therapy: Secondary | ICD-10-CM | POA: Diagnosis not present

## 2018-06-12 ENCOUNTER — Ambulatory Visit
Admission: RE | Admit: 2018-06-12 | Discharge: 2018-06-12 | Disposition: A | Discharge status: Home / Self Care | Payer: 59 | Source: Ambulatory Visit | Attending: Radiation Oncology | Admitting: Radiation Oncology

## 2018-06-12 DIAGNOSIS — Z510 Encounter for antineoplastic radiation therapy: Secondary | ICD-10-CM | POA: Diagnosis not present

## 2018-06-17 ENCOUNTER — Inpatient Hospital Stay (HOSPITAL_BASED_OUTPATIENT_CLINIC_OR_DEPARTMENT_OTHER): Payer: BLUE CROSS/BLUE SHIELD | Admitting: Internal Medicine

## 2018-06-17 ENCOUNTER — Ambulatory Visit
Admission: RE | Admit: 2018-06-17 | Discharge: 2018-06-17 | Disposition: A | Discharge status: Home / Self Care | Payer: BLUE CROSS/BLUE SHIELD | Source: Ambulatory Visit | Attending: Radiation Oncology | Admitting: Radiation Oncology

## 2018-06-17 ENCOUNTER — Inpatient Hospital Stay: Payer: BLUE CROSS/BLUE SHIELD | Attending: Internal Medicine

## 2018-06-17 ENCOUNTER — Inpatient Hospital Stay: Payer: BLUE CROSS/BLUE SHIELD

## 2018-06-17 ENCOUNTER — Encounter: Payer: Self-pay | Admitting: Internal Medicine

## 2018-06-17 VITALS — HR 96

## 2018-06-17 VITALS — BP 110/69 | HR 111 | Temp 98.1°F | Resp 20 | Ht 61.0 in | Wt 229.1 lb

## 2018-06-17 DIAGNOSIS — J449 Chronic obstructive pulmonary disease, unspecified: Secondary | ICD-10-CM | POA: Insufficient documentation

## 2018-06-17 DIAGNOSIS — R1310 Dysphagia, unspecified: Secondary | ICD-10-CM | POA: Insufficient documentation

## 2018-06-17 DIAGNOSIS — I110 Hypertensive heart disease with heart failure: Secondary | ICD-10-CM | POA: Diagnosis not present

## 2018-06-17 DIAGNOSIS — Z510 Encounter for antineoplastic radiation therapy: Secondary | ICD-10-CM | POA: Insufficient documentation

## 2018-06-17 DIAGNOSIS — C3432 Malignant neoplasm of lower lobe, left bronchus or lung: Secondary | ICD-10-CM | POA: Insufficient documentation

## 2018-06-17 DIAGNOSIS — R5383 Other fatigue: Secondary | ICD-10-CM | POA: Diagnosis not present

## 2018-06-17 DIAGNOSIS — C3490 Malignant neoplasm of unspecified part of unspecified bronchus or lung: Secondary | ICD-10-CM

## 2018-06-17 DIAGNOSIS — I509 Heart failure, unspecified: Secondary | ICD-10-CM | POA: Diagnosis not present

## 2018-06-17 DIAGNOSIS — Z5111 Encounter for antineoplastic chemotherapy: Secondary | ICD-10-CM

## 2018-06-17 DIAGNOSIS — C3492 Malignant neoplasm of unspecified part of left bronchus or lung: Secondary | ICD-10-CM

## 2018-06-17 DIAGNOSIS — F17210 Nicotine dependence, cigarettes, uncomplicated: Secondary | ICD-10-CM

## 2018-06-17 DIAGNOSIS — I10 Essential (primary) hypertension: Secondary | ICD-10-CM

## 2018-06-17 LAB — CBC WITH DIFFERENTIAL (CANCER CENTER ONLY)
Abs Immature Granulocytes: 0.07 10*3/uL (ref 0.00–0.07)
Basophils Absolute: 0 10*3/uL (ref 0.0–0.1)
Basophils Relative: 0 %
Eosinophils Absolute: 0 10*3/uL (ref 0.0–0.5)
Eosinophils Relative: 0 %
HCT: 32.8 % — ABNORMAL LOW (ref 36.0–46.0)
Hemoglobin: 10.6 g/dL — ABNORMAL LOW (ref 12.0–15.0)
Immature Granulocytes: 2 %
Lymphocytes Relative: 16 %
Lymphs Abs: 0.8 10*3/uL (ref 0.7–4.0)
MCH: 29.4 pg (ref 26.0–34.0)
MCHC: 32.3 g/dL (ref 30.0–36.0)
MCV: 91.1 fL (ref 80.0–100.0)
Monocytes Absolute: 0.5 10*3/uL (ref 0.1–1.0)
Monocytes Relative: 10 %
Neutro Abs: 3.5 10*3/uL (ref 1.7–7.7)
Neutrophils Relative %: 72 %
Platelet Count: 292 10*3/uL (ref 150–400)
RBC: 3.6 MIL/uL — ABNORMAL LOW (ref 3.87–5.11)
RDW: 14.3 % (ref 11.5–15.5)
WBC Count: 4.8 10*3/uL (ref 4.0–10.5)
nRBC: 0 % (ref 0.0–0.2)

## 2018-06-17 LAB — CMP (CANCER CENTER ONLY)
ALT: 14 U/L (ref 0–44)
AST: 15 U/L (ref 15–41)
Albumin: 2.5 g/dL — ABNORMAL LOW (ref 3.5–5.0)
Alkaline Phosphatase: 96 U/L (ref 38–126)
Anion gap: 12 (ref 5–15)
BUN: 15 mg/dL (ref 6–20)
CO2: 27 mmol/L (ref 22–32)
Calcium: 8.8 mg/dL — ABNORMAL LOW (ref 8.9–10.3)
Chloride: 100 mmol/L (ref 98–111)
Creatinine: 0.94 mg/dL (ref 0.44–1.00)
GFR, Est AFR Am: >60 mL/min (ref >60)
GFR, Estimated: >60 mL/min (ref >60)
Glucose, Bld: 121 mg/dL — ABNORMAL HIGH (ref 70–99)
Potassium: 3.5 mmol/L (ref 3.5–5.1)
Sodium: 139 mmol/L (ref 135–145)
Total Bilirubin: <0.2 mg/dL — ABNORMAL LOW (ref 0.3–1.2)
Total Protein: 6.7 g/dL (ref 6.5–8.1)

## 2018-06-17 MED ORDER — SODIUM CHLORIDE 0.9 % IV SOLN
45.0000 mg/m2 | Freq: Once | INTRAVENOUS | Status: AC
  Administered 2018-06-17: 96 mg via INTRAVENOUS
  Filled 2018-06-17: qty 16

## 2018-06-17 MED ORDER — PALONOSETRON HCL INJECTION 0.25 MG/5ML
INTRAVENOUS | Status: AC
  Filled 2018-06-17: qty 5

## 2018-06-17 MED ORDER — SODIUM CHLORIDE 0.9 % IV SOLN
Freq: Once | INTRAVENOUS | Status: AC
  Administered 2018-06-17: 14:00:00 via INTRAVENOUS
  Filled 2018-06-17: qty 250

## 2018-06-17 MED ORDER — SODIUM CHLORIDE 0.9 % IV SOLN
20.0000 mg | Freq: Once | INTRAVENOUS | Status: AC
  Administered 2018-06-17: 20 mg via INTRAVENOUS
  Filled 2018-06-17: qty 2

## 2018-06-17 MED ORDER — FAMOTIDINE IN NACL 20-0.9 MG/50ML-% IV SOLN
INTRAVENOUS | Status: AC
  Filled 2018-06-17: qty 50

## 2018-06-17 MED ORDER — SUCRALFATE 1 GM/10ML PO SUSP: 1.0000 g | Freq: Three times a day (TID) | ORAL | 0 refills | Status: DC

## 2018-06-17 MED ORDER — FAMOTIDINE IN NACL 20-0.9 MG/50ML-% IV SOLN
20.0000 mg | Freq: Once | INTRAVENOUS | Status: AC
  Administered 2018-06-17: 20 mg via INTRAVENOUS

## 2018-06-17 MED ORDER — DIPHENHYDRAMINE HCL 25 MG PO TABS
25.0000 mg | ORAL_TABLET | Freq: Once | ORAL | Status: AC
  Administered 2018-06-17: 25 mg via ORAL
  Filled 2018-06-17: qty 1

## 2018-06-17 MED ORDER — PALONOSETRON HCL INJECTION 0.25 MG/5ML
0.2500 mg | Freq: Once | INTRAVENOUS | Status: AC
  Administered 2018-06-17: 0.25 mg via INTRAVENOUS

## 2018-06-17 MED ORDER — DIPHENHYDRAMINE HCL 25 MG PO CAPS
ORAL_CAPSULE | ORAL | Status: AC
  Filled 2018-06-17: qty 1

## 2018-06-17 MED ORDER — SODIUM CHLORIDE 0.9 % IV SOLN
260.0000 mg | Freq: Once | INTRAVENOUS | Status: AC
  Administered 2018-06-17: 260 mg via INTRAVENOUS
  Filled 2018-06-17: qty 26

## 2018-06-17 NOTE — Progress Notes (Signed)
Per Dr. Julien Nordmann, adjust carboplatin dose for decreased weight/slightly increased SCr today. Dose today will be carboplatin 260mg .   Demetrius Charity, PharmD, [place] [facility] Pharmacist Pharmacy Phone: [phone] 06/17/2018

## 2018-06-17 NOTE — Patient Instructions (Signed)
   [facility] Discharge Instructions for Patients Receiving Chemotherapy  Today you received the following chemotherapy agents Taxol and Carboplatin   To help prevent nausea and vomiting after your treatment, we encourage you to take your nausea medication as directed.    If you develop nausea and vomiting that is not controlled by your nausea medication, call the clinic.   BELOW ARE SYMPTOMS THAT SHOULD BE REPORTED IMMEDIATELY:  *FEVER GREATER THAN 100.5 F  *CHILLS WITH OR WITHOUT FEVER  NAUSEA AND VOMITING THAT IS NOT CONTROLLED WITH YOUR NAUSEA MEDICATION  *UNUSUAL SHORTNESS OF BREATH  *UNUSUAL BRUISING OR BLEEDING  TENDERNESS IN MOUTH AND THROAT WITH OR WITHOUT PRESENCE OF ULCERS  *URINARY PROBLEMS  *BOWEL PROBLEMS  UNUSUAL RASH Items with * indicate a potential emergency and should be followed up as soon as possible.  Feel free to call the clinic should you have any questions or concerns. The clinic phone number is [phone].  Please show the CHEMO ALERT CARD at check-in to the Emergency Department and triage nurse.   

## 2018-06-17 NOTE — Progress Notes (Signed)
[place] [phone]   [phone]  OFFICE PROGRESS NOTE  [name], MD [place] [address]  DIAGNOSIS: stage IIIB (T3, N2, M0) non-small cell lung cancer, squamous cell carcinoma presented with large left lower lobe lung mass in addition to mediastinal lymphadenopathy diagnosed in October 2019. PDL 1 expression is negative  PRIOR THERAPY:  CURRENT THERAPY: Concurrent chemoradiation with weekly carboplatin for AUC of 2 and paclitaxel 45 mg/M2.  Status post 5 cycles.  INTERVAL HISTORY: Deborah [name] 62 y.o. female returns to the clinic today for follow-up visit accompanied by her husband.  The patient is feeling fine today with no specific complaints except for the baseline shortness of breath as well as fatigue and mild odynophagia.  She is currently on treatment with Carafate tablets but she prefer the suspension.  She denied having any current chest pain but has mild cough with no hemoptysis.  She has no recent weight loss or night sweats.  She has no nausea, vomiting, diarrhea or constipation.  She is here today for evaluation before starting cycle #6 of her treatment.  MEDICAL HISTORY: Past Medical History:  Diagnosis Date  . Anemia    as teen  . Asthma   . CHF (congestive heart failure) ([place])   . COPD, mild ([place])   . Depression   . Diverticulitis   . Family history of anesthesia complication    vomiting  . GI bleeding   . Heart failure ([place])    New onset 07/25/14  . Histoplasmosis    left eye  . Hyperkalemia   . Hypertension   . Obesity (BMI 30-39.9)   . Pneumonia    dx wtih pneumonia on 05/27/16- seen by [facility]   . PONV (postoperative nausea and vomiting)   . Restrictive lung disease   . Shortness of breath    with exertion   . Sleep apnea    mask and oxygen at nite for sleep at 2L   . Tobacco abuse   . Umbilical hernia     ALLERGIES:  has No Known Allergies.  MEDICATIONS:  Current  Outpatient Medications  Medication Sig Dispense Refill  . acetaminophen (TYLENOL) 500 MG tablet Take 1,500 mg by mouth 2 (two) times daily as needed for moderate pain or headache.    . albuterol (PROVENTIL) (2.5 MG/3ML) 0.083% nebulizer solution USE 1 VIAL VIA NEBULIZER EVERY 3 HOURS AS NEEDED FOR WHEEZING OR SHORTNESS OF BREATH (Patient taking differently: Take 2.5 mg by nebulization every 3 (three) hours as needed for wheezing or shortness of breath. ) 1125 mL 1  . aspirin 81 MG chewable tablet Chew 1 tablet (81 mg total) by mouth daily. 30 tablet 1  . diltiazem (CARDIZEM CD) 120 MG 24 hr capsule TAKE 1 CAPSULE BY MOUTH EVERY DAY 90 capsule 1  . furosemide (LASIX) 20 MG tablet TAKE 2 TABLETS EACH MORNING AND 1 AT 3 PM 270 tablet 0  . ipratropium-albuterol (DUONEB) 0.5-2.5 (3) MG/3ML SOLN USE 1 VIAL VIA NEBULIZER EVERY 6 HOURS AS NEEDED (Patient taking differently: Inhale 3 mLs into the lungs every 6 (six) hours as needed (shortness of breath or wheezing). ) 360 mL 2  . montelukast (SINGULAIR) 10 MG tablet TAKE 1 TABLET(10 MG) BY MOUTH AT BEDTIME 90 tablet 1  . OXYGEN Inhale 4 L/min into the lungs continuous.     . potassium chloride SA (KLOR-CON M20) 20 MEQ tablet TAKE 1/2 TABLET(10 MEQ) BY  MOUTH DAILY 45 tablet 1  . prochlorperazine (COMPAZINE) 10 MG tablet Take 1 tablet (10 mg total) by mouth every 6 (six) hours as needed for nausea or vomiting. 30 tablet 0  . Spacer/Aero-Holding Chambers (AEROCHAMBER MV) inhaler Use as instructed 1 each 0  . sucralfate (CARAFATE) 1 g tablet Take 1 tablet (1 g total) by mouth 4 (four) times daily -  with meals and at bedtime. 120 tablet 1  . VENTOLIN HFA 108 (90 Base) MCG/ACT inhaler INHALE 2 PUFFS BY MOUTH EVERY 6 HOURS AS NEEDED FOR WHEEZING OR SHORTNESS OF BREATH 18 g 5  . vitamin B-12 (CYANOCOBALAMIN) 500 MCG tablet Take 500 mcg by mouth daily.     No current facility-administered medications for this visit.     SURGICAL HISTORY:  Past Surgical History:    Procedure Laterality Date  . APPLICATION OF WOUND VAC  03/19/2013   Procedure: APPLICATION OF WOUND VAC;  Surgeon: Madilyn Hook, DO;  Location: [facility];  Service: General;;  . CESAREAN SECTION  04/12/85  . COLONOSCOPY WITH PROPOFOL N/A 06/13/2016   Procedure: COLONOSCOPY WITH PROPOFOL;  Surgeon: Jerene Bears, MD;  [facility]: [facility];  Service: Gastroenterology;  Laterality: N/A;  . ERCP N/A 02/15/2017   Procedure: ENDOSCOPIC RETROGRADE CHOLANGIOPANCREATOGRAPHY (ERCP);  Surgeon: [name], MD;  Location: [facility];  Service: Endoscopy;  Laterality: N/A;  . INSERTION OF MESH N/A 03/19/2013   Procedure: INSERTION OF MESH;  Surgeon: Madilyn Hook, DO;  Location: [facility];  Service: General;  Laterality: N/A;  . VENTRAL HERNIA REPAIR N/A 03/19/2013   Procedure:  OPEN VENTRAL HERNIA REPAIR WITH MESH AND APPLICATION OF WOUND VAC;  Surgeon: Madilyn Hook, DO;  Location: [facility];  Service: General;  Laterality: N/A;  . VIDEO BRONCHOSCOPY Bilateral 04/19/2018   Procedure: VIDEO BRONCHOSCOPY WITH FLUORO;  Surgeon: Rigoberto Noel, MD;  [facility];  Service: Cardiopulmonary;  Laterality: Bilateral;    REVIEW OF SYSTEMS:  A comprehensive review of systems was negative except for: Constitutional: positive for fatigue Respiratory: positive for cough and dyspnea on exertion Gastrointestinal: positive for odynophagia   PHYSICAL EXAMINATION: General appearance: alert, cooperative and no distress Head: Normocephalic, without obvious abnormality, atraumatic Neck: no adenopathy, no JVD, supple, symmetrical, trachea midline and thyroid not enlarged, symmetric, no tenderness/mass/nodules Lymph nodes: Cervical, supraclavicular, and axillary nodes normal. Resp: clear to auscultation bilaterally Back: symmetric, no curvature. ROM normal. No CVA tenderness. Cardio: regular rate and rhythm, S1, S2 normal, no murmur, click, rub or gallop GI: soft, non-tender; bowel sounds normal; no masses,  no  organomegaly Extremities: extremities normal, atraumatic, no cyanosis or edema  ECOG PERFORMANCE STATUS: 1 - Symptomatic but completely ambulatory  Blood pressure 110/69, pulse (!) 111, temperature 98.1 F (36.7 C), temperature source Oral, resp. rate 20, height 5\' 1"  (1.549 m), weight 229 lb 1.6 oz (103.9 kg), SpO2 93 %.  LABORATORY DATA: Lab Results  Component Value Date   WBC 4.8 06/17/2018   HGB 10.6 (L) 06/17/2018   HCT 32.8 (L) 06/17/2018   MCV 91.1 06/17/2018   PLT 292 06/17/2018      Chemistry      Component Value Date/Time   NA 141 06/10/2018 1134   NA 143 11/23/2016 1504   K 3.9 06/10/2018 1134   CL 100 06/10/2018 1134   CO2 30 06/10/2018 1134   BUN 13 06/10/2018 1134   BUN 14 11/23/2016 1504   CREATININE 0.78 06/10/2018 1134      Component Value Date/Time  CALCIUM 8.8 (L) 06/10/2018 1134   ALKPHOS 95 06/10/2018 1134   AST 17 06/10/2018 1134   ALT 20 06/10/2018 1134   BILITOT <0.2 (L) 06/10/2018 1134       RADIOGRAPHIC STUDIES: No results found.  ASSESSMENT AND PLAN: This is a very pleasant 62 years old white female with stage IIIb non-small cell lung cancer, squamous cell carcinoma and currently undergoing a course of concurrent chemoradiation with weekly carboplatin and paclitaxel, status post 5 cycles.   The patient continues to tolerate this treatment well except for fatigue and odynophagia. I recommended for the patient to proceed with cycle #6 today as scheduled. She is expected to complete the course of concurrent chemoradiation on June 27, 2018. I will arrange for the patient to have repeat CT scan of the chest for restaging of her disease before her upcoming visit in 5 weeks. For the odynophagia, I changed her Carafate to oral suspension. The patient was advised to call immediately if she has any concerning symptoms in the interval. The patient voices understanding of current disease status and treatment options and is in agreement with the  current care plan.  All questions were answered. The patient knows to call the clinic with any problems, questions or concerns. We can certainly see the patient much sooner if necessary.  I spent 10 minutes counseling the patient face to face. The total time spent in the appointment was 15 minutes.  Disclaimer: This note was dictated with voice recognition software. Similar sounding words can inadvertently be transcribed and may not be corrected upon review.

## 2018-06-18 ENCOUNTER — Other Ambulatory Visit: Payer: Self-pay | Admitting: Internal Medicine

## 2018-06-18 ENCOUNTER — Ambulatory Visit
Admission: RE | Admit: 2018-06-18 | Discharge: 2018-06-18 | Disposition: A | Discharge status: Home / Self Care | Payer: BLUE CROSS/BLUE SHIELD | Source: Ambulatory Visit | Attending: Radiation Oncology | Admitting: Radiation Oncology

## 2018-06-18 ENCOUNTER — Telehealth: Payer: Self-pay | Admitting: Medical Oncology

## 2018-06-18 ENCOUNTER — Telehealth: Payer: Self-pay | Admitting: Internal Medicine

## 2018-06-18 DIAGNOSIS — F17210 Nicotine dependence, cigarettes, uncomplicated: Secondary | ICD-10-CM | POA: Diagnosis not present

## 2018-06-18 DIAGNOSIS — R1310 Dysphagia, unspecified: Secondary | ICD-10-CM | POA: Diagnosis not present

## 2018-06-18 DIAGNOSIS — C3432 Malignant neoplasm of lower lobe, left bronchus or lung: Secondary | ICD-10-CM | POA: Diagnosis not present

## 2018-06-18 DIAGNOSIS — J449 Chronic obstructive pulmonary disease, unspecified: Secondary | ICD-10-CM | POA: Diagnosis not present

## 2018-06-18 DIAGNOSIS — I110 Hypertensive heart disease with heart failure: Secondary | ICD-10-CM | POA: Diagnosis not present

## 2018-06-18 DIAGNOSIS — Z5111 Encounter for antineoplastic chemotherapy: Secondary | ICD-10-CM | POA: Diagnosis not present

## 2018-06-18 DIAGNOSIS — R5383 Other fatigue: Secondary | ICD-10-CM | POA: Diagnosis not present

## 2018-06-18 DIAGNOSIS — I509 Heart failure, unspecified: Secondary | ICD-10-CM | POA: Diagnosis not present

## 2018-06-18 NOTE — Telephone Encounter (Signed)
Scheduled appt per 12/2 los - pt to get an updated schedule next visit.

## 2018-06-18 NOTE — Telephone Encounter (Signed)
LVM re f/u for insurance denial for carafate.

## 2018-06-19 ENCOUNTER — Ambulatory Visit
Admission: RE | Admit: 2018-06-19 | Discharge: 2018-06-19 | Disposition: A | Discharge status: Home / Self Care | Payer: BLUE CROSS/BLUE SHIELD | Source: Ambulatory Visit | Attending: Radiation Oncology | Admitting: Radiation Oncology

## 2018-06-19 DIAGNOSIS — R5383 Other fatigue: Secondary | ICD-10-CM | POA: Diagnosis not present

## 2018-06-19 DIAGNOSIS — I110 Hypertensive heart disease with heart failure: Secondary | ICD-10-CM | POA: Diagnosis not present

## 2018-06-19 DIAGNOSIS — C3432 Malignant neoplasm of lower lobe, left bronchus or lung: Secondary | ICD-10-CM | POA: Diagnosis not present

## 2018-06-19 DIAGNOSIS — I509 Heart failure, unspecified: Secondary | ICD-10-CM | POA: Diagnosis not present

## 2018-06-19 DIAGNOSIS — R1310 Dysphagia, unspecified: Secondary | ICD-10-CM | POA: Diagnosis not present

## 2018-06-19 DIAGNOSIS — F17210 Nicotine dependence, cigarettes, uncomplicated: Secondary | ICD-10-CM | POA: Diagnosis not present

## 2018-06-19 DIAGNOSIS — J449 Chronic obstructive pulmonary disease, unspecified: Secondary | ICD-10-CM | POA: Diagnosis not present

## 2018-06-19 DIAGNOSIS — Z5111 Encounter for antineoplastic chemotherapy: Secondary | ICD-10-CM | POA: Diagnosis not present

## 2018-06-19 NOTE — Telephone Encounter (Signed)
Yes I will work on it today.

## 2018-06-20 ENCOUNTER — Ambulatory Visit
Admission: RE | Admit: 2018-06-20 | Discharge: 2018-06-20 | Disposition: A | Discharge status: Home / Self Care | Payer: BLUE CROSS/BLUE SHIELD | Source: Ambulatory Visit | Attending: Radiation Oncology | Admitting: Radiation Oncology

## 2018-06-20 DIAGNOSIS — I110 Hypertensive heart disease with heart failure: Secondary | ICD-10-CM | POA: Diagnosis not present

## 2018-06-20 DIAGNOSIS — I509 Heart failure, unspecified: Secondary | ICD-10-CM | POA: Diagnosis not present

## 2018-06-20 DIAGNOSIS — Z5111 Encounter for antineoplastic chemotherapy: Secondary | ICD-10-CM | POA: Diagnosis not present

## 2018-06-20 DIAGNOSIS — R1310 Dysphagia, unspecified: Secondary | ICD-10-CM | POA: Diagnosis not present

## 2018-06-20 DIAGNOSIS — J449 Chronic obstructive pulmonary disease, unspecified: Secondary | ICD-10-CM | POA: Diagnosis not present

## 2018-06-20 DIAGNOSIS — C3432 Malignant neoplasm of lower lobe, left bronchus or lung: Secondary | ICD-10-CM | POA: Diagnosis not present

## 2018-06-20 DIAGNOSIS — F17210 Nicotine dependence, cigarettes, uncomplicated: Secondary | ICD-10-CM | POA: Diagnosis not present

## 2018-06-20 DIAGNOSIS — R5383 Other fatigue: Secondary | ICD-10-CM | POA: Diagnosis not present

## 2018-06-21 ENCOUNTER — Ambulatory Visit
Admission: RE | Admit: 2018-06-21 | Discharge: 2018-06-21 | Disposition: A | Discharge status: Home / Self Care | Payer: BLUE CROSS/BLUE SHIELD | Source: Ambulatory Visit | Attending: Radiation Oncology | Admitting: Radiation Oncology

## 2018-06-21 DIAGNOSIS — I110 Hypertensive heart disease with heart failure: Secondary | ICD-10-CM | POA: Diagnosis not present

## 2018-06-21 DIAGNOSIS — R5383 Other fatigue: Secondary | ICD-10-CM | POA: Diagnosis not present

## 2018-06-21 DIAGNOSIS — Z5111 Encounter for antineoplastic chemotherapy: Secondary | ICD-10-CM | POA: Diagnosis not present

## 2018-06-21 DIAGNOSIS — R1310 Dysphagia, unspecified: Secondary | ICD-10-CM | POA: Diagnosis not present

## 2018-06-21 DIAGNOSIS — F17210 Nicotine dependence, cigarettes, uncomplicated: Secondary | ICD-10-CM | POA: Diagnosis not present

## 2018-06-21 DIAGNOSIS — J449 Chronic obstructive pulmonary disease, unspecified: Secondary | ICD-10-CM | POA: Diagnosis not present

## 2018-06-21 DIAGNOSIS — I509 Heart failure, unspecified: Secondary | ICD-10-CM | POA: Diagnosis not present

## 2018-06-21 DIAGNOSIS — C3432 Malignant neoplasm of lower lobe, left bronchus or lung: Secondary | ICD-10-CM | POA: Diagnosis not present

## 2018-06-24 ENCOUNTER — Ambulatory Visit
Admission: RE | Admit: 2018-06-24 | Discharge: 2018-06-24 | Disposition: A | Discharge status: Home / Self Care | Payer: BLUE CROSS/BLUE SHIELD | Source: Ambulatory Visit | Attending: Radiation Oncology | Admitting: Radiation Oncology

## 2018-06-24 ENCOUNTER — Inpatient Hospital Stay: Payer: BLUE CROSS/BLUE SHIELD

## 2018-06-24 VITALS — BP 102/75 | HR 99 | Temp 98.2°F | Resp 20

## 2018-06-24 DIAGNOSIS — J449 Chronic obstructive pulmonary disease, unspecified: Secondary | ICD-10-CM | POA: Diagnosis not present

## 2018-06-24 DIAGNOSIS — C3432 Malignant neoplasm of lower lobe, left bronchus or lung: Secondary | ICD-10-CM

## 2018-06-24 DIAGNOSIS — Z5111 Encounter for antineoplastic chemotherapy: Secondary | ICD-10-CM | POA: Diagnosis not present

## 2018-06-24 DIAGNOSIS — I110 Hypertensive heart disease with heart failure: Secondary | ICD-10-CM | POA: Diagnosis not present

## 2018-06-24 DIAGNOSIS — I509 Heart failure, unspecified: Secondary | ICD-10-CM | POA: Diagnosis not present

## 2018-06-24 DIAGNOSIS — C3492 Malignant neoplasm of unspecified part of left bronchus or lung: Secondary | ICD-10-CM

## 2018-06-24 DIAGNOSIS — R1310 Dysphagia, unspecified: Secondary | ICD-10-CM | POA: Diagnosis not present

## 2018-06-24 DIAGNOSIS — F17210 Nicotine dependence, cigarettes, uncomplicated: Secondary | ICD-10-CM | POA: Diagnosis not present

## 2018-06-24 DIAGNOSIS — R5383 Other fatigue: Secondary | ICD-10-CM | POA: Diagnosis not present

## 2018-06-24 LAB — CMP (CANCER CENTER ONLY)
ALT: 13 U/L (ref 0–44)
AST: 15 U/L (ref 15–41)
Albumin: 2.7 g/dL — ABNORMAL LOW (ref 3.5–5.0)
Alkaline Phosphatase: 91 U/L (ref 38–126)
Anion gap: 14 (ref 5–15)
BUN: 13 mg/dL (ref 6–20)
CO2: 27 mmol/L (ref 22–32)
Calcium: 9.4 mg/dL (ref 8.9–10.3)
Chloride: 96 mmol/L — ABNORMAL LOW (ref 98–111)
Creatinine: 0.88 mg/dL (ref 0.44–1.00)
GFR, Est AFR Am: >60 mL/min (ref >60)
GFR, Estimated: >60 mL/min (ref >60)
Glucose, Bld: 114 mg/dL — ABNORMAL HIGH (ref 70–99)
Potassium: 4.4 mmol/L (ref 3.5–5.1)
Sodium: 137 mmol/L (ref 135–145)
Total Bilirubin: 0.4 mg/dL (ref 0.3–1.2)
Total Protein: 7.2 g/dL (ref 6.5–8.1)

## 2018-06-24 LAB — CBC WITH DIFFERENTIAL (CANCER CENTER ONLY)
Abs Immature Granulocytes: 0.11 10*3/uL — ABNORMAL HIGH (ref 0.00–0.07)
Basophils Absolute: 0 10*3/uL (ref 0.0–0.1)
Basophils Relative: 1 %
Eosinophils Absolute: 0 10*3/uL (ref 0.0–0.5)
Eosinophils Relative: 0 %
HCT: 34.2 % — ABNORMAL LOW (ref 36.0–46.0)
Hemoglobin: 10.9 g/dL — ABNORMAL LOW (ref 12.0–15.0)
Immature Granulocytes: 2 %
Lymphocytes Relative: 10 %
Lymphs Abs: 0.6 10*3/uL — ABNORMAL LOW (ref 0.7–4.0)
MCH: 29.5 pg (ref 26.0–34.0)
MCHC: 31.9 g/dL (ref 30.0–36.0)
MCV: 92.4 fL (ref 80.0–100.0)
Monocytes Absolute: 0.7 10*3/uL (ref 0.1–1.0)
Monocytes Relative: 12 %
Neutro Abs: 4.7 10*3/uL (ref 1.7–7.7)
Neutrophils Relative %: 75 %
Platelet Count: 324 10*3/uL (ref 150–400)
RBC: 3.7 MIL/uL — ABNORMAL LOW (ref 3.87–5.11)
RDW: 15.1 % (ref 11.5–15.5)
WBC Count: 6.2 10*3/uL (ref 4.0–10.5)
nRBC: 0 % (ref 0.0–0.2)

## 2018-06-24 MED ORDER — DIPHENHYDRAMINE HCL 25 MG PO TABS
25.0000 mg | ORAL_TABLET | Freq: Once | ORAL | Status: AC
  Administered 2018-06-24: 25 mg via ORAL
  Filled 2018-06-24: qty 1

## 2018-06-24 MED ORDER — SODIUM CHLORIDE 0.9 % IV SOLN
45.0000 mg/m2 | Freq: Once | INTRAVENOUS | Status: AC
  Administered 2018-06-24: 96 mg via INTRAVENOUS
  Filled 2018-06-24: qty 16

## 2018-06-24 MED ORDER — DIPHENHYDRAMINE HCL 25 MG PO CAPS
ORAL_CAPSULE | ORAL | Status: AC
  Filled 2018-06-24: qty 1

## 2018-06-24 MED ORDER — PALONOSETRON HCL INJECTION 0.25 MG/5ML
0.2500 mg | Freq: Once | INTRAVENOUS | Status: AC
  Administered 2018-06-24: 0.25 mg via INTRAVENOUS

## 2018-06-24 MED ORDER — PALONOSETRON HCL INJECTION 0.25 MG/5ML
INTRAVENOUS | Status: AC
  Filled 2018-06-24: qty 5

## 2018-06-24 MED ORDER — FAMOTIDINE IN NACL 20-0.9 MG/50ML-% IV SOLN
INTRAVENOUS | Status: AC
  Filled 2018-06-24: qty 50

## 2018-06-24 MED ORDER — FAMOTIDINE IN NACL 20-0.9 MG/50ML-% IV SOLN
20.0000 mg | Freq: Once | INTRAVENOUS | Status: AC
  Administered 2018-06-24: 20 mg via INTRAVENOUS

## 2018-06-24 MED ORDER — SODIUM CHLORIDE 0.9 % IV SOLN
Freq: Once | INTRAVENOUS | Status: AC
  Administered 2018-06-24: 13:00:00 via INTRAVENOUS
  Filled 2018-06-24: qty 250

## 2018-06-24 MED ORDER — SODIUM CHLORIDE 0.9 % IV SOLN
300.0000 mg | Freq: Once | INTRAVENOUS | Status: AC
  Administered 2018-06-24: 300 mg via INTRAVENOUS
  Filled 2018-06-24: qty 30

## 2018-06-24 MED ORDER — SODIUM CHLORIDE 0.9 % IV SOLN
20.0000 mg | Freq: Once | INTRAVENOUS | Status: AC
  Administered 2018-06-24: 20 mg via INTRAVENOUS
  Filled 2018-06-24: qty 2

## 2018-06-24 NOTE — Patient Instructions (Signed)
[facility] Discharge Instructions for Patients Receiving Chemotherapy  Today you received the following chemotherapy agents taxol/carboplatin  To help prevent nausea and vomiting after your treatment, we encourage you to take your nausea medication as directed   If you develop nausea and vomiting that is not controlled by your nausea medication, call the clinic.   BELOW ARE SYMPTOMS THAT SHOULD BE REPORTED IMMEDIATELY:  *FEVER GREATER THAN 100.5 F  *CHILLS WITH OR WITHOUT FEVER  NAUSEA AND VOMITING THAT IS NOT CONTROLLED WITH YOUR NAUSEA MEDICATION  *UNUSUAL SHORTNESS OF BREATH  *UNUSUAL BRUISING OR BLEEDING  TENDERNESS IN MOUTH AND THROAT WITH OR WITHOUT PRESENCE OF ULCERS  *URINARY PROBLEMS  *BOWEL PROBLEMS  UNUSUAL RASH Items with * indicate a potential emergency and should be followed up as soon as possible.  Feel free to call the clinic you have any questions or concerns. The clinic phone number is [phone].  

## 2018-06-25 ENCOUNTER — Ambulatory Visit: Payer: BLUE CROSS/BLUE SHIELD

## 2018-06-25 ENCOUNTER — Ambulatory Visit
Admission: RE | Admit: 2018-06-25 | Discharge: 2018-06-25 | Disposition: A | Discharge status: Home / Self Care | Payer: BLUE CROSS/BLUE SHIELD | Source: Ambulatory Visit | Attending: Radiation Oncology | Admitting: Radiation Oncology

## 2018-06-25 DIAGNOSIS — I110 Hypertensive heart disease with heart failure: Secondary | ICD-10-CM | POA: Diagnosis not present

## 2018-06-25 DIAGNOSIS — Z5111 Encounter for antineoplastic chemotherapy: Secondary | ICD-10-CM | POA: Diagnosis not present

## 2018-06-25 DIAGNOSIS — F17210 Nicotine dependence, cigarettes, uncomplicated: Secondary | ICD-10-CM | POA: Diagnosis not present

## 2018-06-25 DIAGNOSIS — R1310 Dysphagia, unspecified: Secondary | ICD-10-CM | POA: Diagnosis not present

## 2018-06-25 DIAGNOSIS — C3432 Malignant neoplasm of lower lobe, left bronchus or lung: Secondary | ICD-10-CM | POA: Diagnosis not present

## 2018-06-25 DIAGNOSIS — R5383 Other fatigue: Secondary | ICD-10-CM | POA: Diagnosis not present

## 2018-06-25 DIAGNOSIS — J449 Chronic obstructive pulmonary disease, unspecified: Secondary | ICD-10-CM | POA: Diagnosis not present

## 2018-06-25 DIAGNOSIS — I509 Heart failure, unspecified: Secondary | ICD-10-CM | POA: Diagnosis not present

## 2018-06-26 ENCOUNTER — Ambulatory Visit: Payer: BLUE CROSS/BLUE SHIELD

## 2018-06-26 ENCOUNTER — Ambulatory Visit
Admission: RE | Admit: 2018-06-26 | Discharge: 2018-06-26 | Disposition: A | Discharge status: Home / Self Care | Payer: BLUE CROSS/BLUE SHIELD | Source: Ambulatory Visit | Attending: Radiation Oncology | Admitting: Radiation Oncology

## 2018-06-26 DIAGNOSIS — Z5111 Encounter for antineoplastic chemotherapy: Secondary | ICD-10-CM | POA: Diagnosis not present

## 2018-06-26 DIAGNOSIS — I509 Heart failure, unspecified: Secondary | ICD-10-CM | POA: Diagnosis not present

## 2018-06-26 DIAGNOSIS — I110 Hypertensive heart disease with heart failure: Secondary | ICD-10-CM | POA: Diagnosis not present

## 2018-06-26 DIAGNOSIS — F17210 Nicotine dependence, cigarettes, uncomplicated: Secondary | ICD-10-CM | POA: Diagnosis not present

## 2018-06-26 DIAGNOSIS — J449 Chronic obstructive pulmonary disease, unspecified: Secondary | ICD-10-CM | POA: Diagnosis not present

## 2018-06-26 DIAGNOSIS — C3432 Malignant neoplasm of lower lobe, left bronchus or lung: Secondary | ICD-10-CM | POA: Diagnosis not present

## 2018-06-26 DIAGNOSIS — R5383 Other fatigue: Secondary | ICD-10-CM | POA: Diagnosis not present

## 2018-06-26 DIAGNOSIS — R1310 Dysphagia, unspecified: Secondary | ICD-10-CM | POA: Diagnosis not present

## 2018-06-27 ENCOUNTER — Ambulatory Visit
Admission: RE | Admit: 2018-06-27 | Discharge: 2018-06-27 | Disposition: A | Discharge status: Home / Self Care | Payer: BLUE CROSS/BLUE SHIELD | Source: Ambulatory Visit | Attending: Radiation Oncology | Admitting: Radiation Oncology

## 2018-06-27 ENCOUNTER — Encounter: Payer: Self-pay | Admitting: Radiation Oncology

## 2018-06-27 ENCOUNTER — Ambulatory Visit: Payer: BLUE CROSS/BLUE SHIELD

## 2018-06-27 DIAGNOSIS — C3432 Malignant neoplasm of lower lobe, left bronchus or lung: Secondary | ICD-10-CM | POA: Diagnosis not present

## 2018-06-27 DIAGNOSIS — I110 Hypertensive heart disease with heart failure: Secondary | ICD-10-CM | POA: Diagnosis not present

## 2018-06-27 DIAGNOSIS — R5383 Other fatigue: Secondary | ICD-10-CM | POA: Diagnosis not present

## 2018-06-27 DIAGNOSIS — R1310 Dysphagia, unspecified: Secondary | ICD-10-CM | POA: Diagnosis not present

## 2018-06-27 DIAGNOSIS — J449 Chronic obstructive pulmonary disease, unspecified: Secondary | ICD-10-CM | POA: Diagnosis not present

## 2018-06-27 DIAGNOSIS — Z5111 Encounter for antineoplastic chemotherapy: Secondary | ICD-10-CM | POA: Diagnosis not present

## 2018-06-27 DIAGNOSIS — I509 Heart failure, unspecified: Secondary | ICD-10-CM | POA: Diagnosis not present

## 2018-06-27 DIAGNOSIS — F17210 Nicotine dependence, cigarettes, uncomplicated: Secondary | ICD-10-CM | POA: Diagnosis not present

## 2018-06-28 ENCOUNTER — Ambulatory Visit: Payer: BLUE CROSS/BLUE SHIELD

## 2018-07-01 NOTE — Progress Notes (Signed)
  Radiation Oncology         [phone] ________________________________  Name: Deborah [name] MRN: [id number]  Date: 06/27/2018  DOB: Nov 30, 1955  End of Treatment Note  Diagnosis:  62 y.o. female with Stage IIIB, cT3N2M0 NSCLC, squamous cell carcinoma of the LLL  Indication for treatment::  curative       Radiation treatment dates:   05/13/2018 - 06/27/2018  Site/dose:   The patient was treated to the disease within the LLL lung initially to a dose of 60 Gy in 30 fractions using a 5 field, 3-D conformal technique. The patient then received a cone down boost treatment for an additional 6 Gy delivered in 3 fractions. This yielded a final total dose of 66 Gy.   Beams/energy:   3D / 6X, 15X Photon  Narrative: The patient tolerated radiation treatment relatively well with concurrent chemotherapy.   The patient did experience esophagitis during the course of treatment which required management with Carafate. She did lose some weight as a result of having a poor appetite. She also noted increased fatigue. She continues to use oxygen.  Plan: The patient has completed radiation treatment. The patient will return to radiation oncology clinic for routine followup in one month. I advised the patient to call or return sooner if they have any questions or concerns related to their recovery or treatment. ________________________________  Jodelle Gross, MD, PhD  This document serves as a record of services personally performed by Kyung Rudd, MD. It was created on his behalf by Rae Lips, a trained medical scribe. The creation of this record is based on the scribe's personal observations and the provider's statements to them. This document has been checked and approved by the attending provider.

## 2018-07-04 ENCOUNTER — Telehealth: Payer: Self-pay | Admitting: *Deleted

## 2018-07-04 NOTE — Telephone Encounter (Signed)
Oncology Nurse Navigator Documentation  Oncology Nurse Navigator Flowsheets 07/04/2018  Navigator Location [facility]  Navigator Encounter Type Telephone/I received a message from Dr. Lisbeth Renshaw updating me that patient would like to change providers at pulmonary office. I called that office and ask how to change patient's provider.  I was updated that patient needs to call to change providers.  I then called patient to update her on what to do. She verbalized understanding of next steps.    Telephone Outgoing Call  Treatment Phase Follow-up  Barriers/Navigation Needs Education;Coordination of Care  Education Other  Interventions Coordination of Care;Education  Coordination of Care Other  Education Method Verbal  Acuity Level 3  Acuity Level 3 Coordination of multimodality treatment;Emotional needs;Ongoing guidance and education provided throughout treatment  Time Spent with Patient 30

## 2018-07-05 ENCOUNTER — Ambulatory Visit: Payer: BLUE CROSS/BLUE SHIELD | Admitting: Cardiovascular Disease

## 2018-07-05 ENCOUNTER — Encounter: Payer: Self-pay | Admitting: Cardiovascular Disease

## 2018-07-05 VITALS — BP 130/78 | HR 101 | Ht 63.0 in | Wt 225.0 lb

## 2018-07-05 DIAGNOSIS — I5032 Chronic diastolic (congestive) heart failure: Secondary | ICD-10-CM

## 2018-07-05 DIAGNOSIS — I10 Essential (primary) hypertension: Secondary | ICD-10-CM

## 2018-07-05 NOTE — Patient Instructions (Signed)

## 2018-07-05 NOTE — Progress Notes (Signed)
Cardiology Office Note   Date:  07/05/2018   ID:  Gila, Lauf March 02, 1956, MRN [id number]  PCP:  [name], MD  Cardiologist:   Kathlyn Sacramento, MD   Chief Complaint  Patient presents with  . other    6 m follow up. SOB chronic. CP off and on rest resolves. Medication reviewed verbally.       History of Present Illness: WILLY VORCE is a 62 y.o. female who presents for a follow-up visit regarding chronic diastolic heart failure and chronic right heart failure due to COPD. She has known history of previous tobacco use with COPD, hypertension, Obesity and bipolar disorder. She was hospitalized in January, 2016 for CHF.  She had moderate size left pleural effusion. Echocardiogram showed an ejection fraction of 40-98%, grade 1 diastolic dysfunction, dilated right ventricle with hypokinesis and mild pulmonary hypertension.    She quit smoking completely since then .VQ scan was low probability for pulmonary embolism.   She was diagnosed with squamous cell carcinoma of the lung , stage IIIb in October.  She had large left lower lobe mass in addition to mediastinal lymphadenopathy.  The patient was treated with radiation and chemotherapy which was recently finished. Overall, she has been doing reasonably well.  She feels tired and fatigued from chemotherapy but overall her dyspnea has been stable.  Past Medical History:  Diagnosis Date  . Anemia    as teen  . Asthma   . CHF (congestive heart failure) ([place])   . COPD, mild ([place])   . Depression   . Diverticulitis   . Family history of anesthesia complication    vomiting  . GI bleeding   . Heart failure ([place])    New onset 07/25/14  . Histoplasmosis    left eye  . Hyperkalemia   . Hypertension   . Obesity (BMI 30-39.9)   . Pneumonia    dx wtih pneumonia on 05/27/16- seen by [facility]   . PONV (postoperative nausea and vomiting)   . Restrictive lung disease   . Shortness of breath    with exertion   . Sleep apnea      mask and oxygen at nite for sleep at 2L   . Tobacco abuse   . Umbilical hernia     Past Surgical History:  Procedure Laterality Date  . APPLICATION OF WOUND VAC  03/19/2013   Procedure: APPLICATION OF WOUND VAC;  Surgeon: Madilyn Hook, DO;  [facility]: [facility];  Service: General;;  . CESAREAN SECTION  04/12/85  . COLONOSCOPY WITH PROPOFOL N/A 06/13/2016   Procedure: COLONOSCOPY WITH PROPOFOL;  Surgeon: Jerene Bears, MD;  [facility];  Service: Gastroenterology;  Laterality: N/A;  . ERCP N/A 02/15/2017   Procedure: ENDOSCOPIC RETROGRADE CHOLANGIOPANCREATOGRAPHY (ERCP);  Surgeon: [name], MD;  Location: [facility];  Service: Endoscopy;  Laterality: N/A;  . INSERTION OF MESH N/A 03/19/2013   Procedure: INSERTION OF MESH;  Surgeon: Madilyn Hook, DO;  [facility];  Service: General;  Laterality: N/A;  . VENTRAL HERNIA REPAIR N/A 03/19/2013   Procedure:  OPEN VENTRAL HERNIA REPAIR WITH MESH AND APPLICATION OF WOUND VAC;  Surgeon: Madilyn Hook, DO;  [facility]: [facility];  Service: General;  Laterality: N/A;  . VIDEO BRONCHOSCOPY Bilateral 04/19/2018   Procedure: VIDEO BRONCHOSCOPY WITH FLUORO;  Surgeon: Rigoberto Noel, MD;  [facility];  Service: Cardiopulmonary;  Laterality: Bilateral;     Current Outpatient Medications  Medication Sig Dispense Refill  .  acetaminophen (TYLENOL) 500 MG tablet Take 1,500 mg by mouth 2 (two) times daily as needed for moderate pain or headache.    . albuterol (PROVENTIL) (2.5 MG/3ML) 0.083% nebulizer solution USE 1 VIAL VIA NEBULIZER EVERY 3 HOURS AS NEEDED FOR WHEEZING OR SHORTNESS OF BREATH (Patient taking differently: Take 2.5 mg by nebulization every 3 (three) hours as needed for wheezing or shortness of breath. ) 1125 mL 1  . aspirin 81 MG chewable tablet Chew 1 tablet (81 mg total) by mouth daily. 30 tablet 1  . diltiazem (CARDIZEM CD) 120 MG 24 hr capsule TAKE 1 CAPSULE BY MOUTH EVERY DAY 90 capsule 1  . furosemide (LASIX) 20 MG  tablet TAKE 2 TABLETS EACH MORNING AND 1 AT 3 PM 270 tablet 0  . ipratropium-albuterol (DUONEB) 0.5-2.5 (3) MG/3ML SOLN USE 1 VIAL VIA NEBULIZER EVERY 6 HOURS AS NEEDED (Patient taking differently: Inhale 3 mLs into the lungs every 6 (six) hours as needed (shortness of breath or wheezing). ) 360 mL 2  . montelukast (SINGULAIR) 10 MG tablet TAKE 1 TABLET(10 MG) BY MOUTH AT BEDTIME 90 tablet 1  . OXYGEN Inhale 4 L/min into the lungs continuous.     . potassium chloride SA (KLOR-CON M20) 20 MEQ tablet TAKE 1/2 TABLET(10 MEQ) BY MOUTH DAILY 45 tablet 1  . prochlorperazine (COMPAZINE) 10 MG tablet TAKE 1 TABLET BY MOUTH EVERY 6 HOURS AS NEEDED FOR NAUSEA OR VOMITING. 30 tablet 0  . Spacer/Aero-Holding Chambers (AEROCHAMBER MV) inhaler Use as instructed 1 each 0  . VENTOLIN HFA 108 (90 Base) MCG/ACT inhaler INHALE 2 PUFFS BY MOUTH EVERY 6 HOURS AS NEEDED FOR WHEEZING OR SHORTNESS OF BREATH 18 g 5  . vitamin B-12 (CYANOCOBALAMIN) 500 MCG tablet Take 500 mcg by mouth daily.     No current facility-administered medications for this visit.     Allergies:   Patient has no known allergies.    Social History:  The patient  reports that she quit smoking about 5 years ago. Her smoking use included cigarettes. She has a 129.00 pack-year smoking history. She has never used smokeless tobacco. She reports that she does not drink alcohol or use drugs.   Family History:  The patient's family history includes Cancer in her unknown relative; Emphysema in her mother and unknown relative; Heart attack in her mother; Heart failure in her unknown relative; Lymphoma in her maternal uncle; Pancreatic cancer in her maternal aunt.    ROS:  Please see the history of present illness.   Otherwise, review of systems are positive for none.   All other systems are reviewed and negative.    PHYSICAL EXAM: VS:  BP 130/78 (BP Location: Left Arm, Patient Position: Sitting, Cuff Size: Normal)   Pulse (!) 101   Ht 5\' 3"  (1.6 m)    Wt 225 lb (102.1 kg)   BMI 39.86 kg/m  , BMI Body mass index is 39.86 kg/m. GEN: Well nourished, well developed, in no acute distress  HEENT: normal  Neck: no JVD, carotid bruits, or masses Cardiac: RRR; no murmurs, rubs, or gallops,no edema  Respiratory:  clear to auscultation bilaterally with diminished breath sounds, normal work of breathing GI: soft, nontender, nondistended, + BS MS: no deformity or atrophy  Skin: warm and dry, no rash Neuro:  Strength and sensation are intact Psych: euthymic mood, full affect   EKG:  EKG is ordered today. The ekg ordered today demonstrates sinus tachycardia with PACs.  Low voltage and nonspecific ST and T  wave changes.   Recent Labs: 06/24/2018: ALT 13; BUN 13; Creatinine 0.88; Hemoglobin 10.9; Platelet Count 324; Potassium 4.4; Sodium 137    Lipid Panel    Component Value Date/Time   CHOL 200 03/29/2015 1216   TRIG 98.0 03/29/2015 1216   HDL 44.60 03/29/2015 1216   CHOLHDL 4 03/29/2015 1216   VLDL 19.6 03/29/2015 1216   LDLCALC 136 (H) 03/29/2015 1216      Wt Readings from Last 3 Encounters:  07/05/18 225 lb (102.1 kg)  06/17/18 229 lb 1.6 oz (103.9 kg)  06/03/18 235 lb (106.6 kg)        ASSESSMENT AND PLAN:  1.  Chronic diastolic heart failure: She appears to be euvolemic on current dose of furosemide.    2. Essential hypertension: Blood pressure  is controlled on diltiazem.  I will sinus tachycardia likely due to underlying lung disease and recent chemotherapy with anemia.  Continue to monitor for now and we can consider increasing the dose of diltiazem if needed.  3.  Stage IIIb lung cancer, status post radiation and chemotherapy.  Followed by oncology.  Disposition:   FU with me in 6 months  Signed,  Kathlyn Sacramento, MD  07/05/2018 11:22 AM    [place]

## 2018-07-19 ENCOUNTER — Encounter: Payer: Self-pay | Admitting: Medical Oncology

## 2018-07-19 ENCOUNTER — Telehealth: Payer: Self-pay | Admitting: Medical Oncology

## 2018-07-19 ENCOUNTER — Ambulatory Visit (HOSPITAL_COMMUNITY)
Admission: RE | Admit: 2018-07-19 | Discharge: 2018-07-19 | Disposition: A | Discharge status: Home / Self Care | Payer: BLUE CROSS/BLUE SHIELD | Source: Ambulatory Visit | Attending: Internal Medicine | Admitting: Internal Medicine

## 2018-07-19 DIAGNOSIS — C3490 Malignant neoplasm of unspecified part of unspecified bronchus or lung: Secondary | ICD-10-CM | POA: Diagnosis not present

## 2018-07-19 DIAGNOSIS — J432 Centrilobular emphysema: Secondary | ICD-10-CM | POA: Diagnosis not present

## 2018-07-19 MED ORDER — SODIUM CHLORIDE (PF) 0.9 % IJ SOLN
INTRAMUSCULAR | Status: AC
  Filled 2018-07-19: qty 50

## 2018-07-19 MED ORDER — IOHEXOL 300 MG/ML  SOLN
75.0000 mL | Freq: Once | INTRAMUSCULAR | Status: AC | PRN
  Administered 2018-07-19: 75 mL via INTRAVENOUS

## 2018-07-19 NOTE — Telephone Encounter (Signed)
[employer] duty letter ready for pick up will pick up letter on Monday.

## 2018-07-22 ENCOUNTER — Inpatient Hospital Stay: Payer: BLUE CROSS/BLUE SHIELD | Attending: Internal Medicine | Admitting: Internal Medicine

## 2018-07-22 ENCOUNTER — Telehealth: Payer: Self-pay

## 2018-07-22 ENCOUNTER — Encounter: Payer: Self-pay | Admitting: Internal Medicine

## 2018-07-22 VITALS — BP 101/74 | HR 103 | Temp 97.8°F | Resp 26 | Ht 63.0 in | Wt 226.3 lb

## 2018-07-22 DIAGNOSIS — J449 Chronic obstructive pulmonary disease, unspecified: Secondary | ICD-10-CM | POA: Diagnosis not present

## 2018-07-22 DIAGNOSIS — Z5112 Encounter for antineoplastic immunotherapy: Secondary | ICD-10-CM | POA: Diagnosis not present

## 2018-07-22 DIAGNOSIS — I10 Essential (primary) hypertension: Secondary | ICD-10-CM | POA: Diagnosis not present

## 2018-07-22 DIAGNOSIS — C3432 Malignant neoplasm of lower lobe, left bronchus or lung: Secondary | ICD-10-CM

## 2018-07-22 DIAGNOSIS — J432 Centrilobular emphysema: Secondary | ICD-10-CM

## 2018-07-22 DIAGNOSIS — Z7189 Other specified counseling: Secondary | ICD-10-CM

## 2018-07-22 NOTE — Progress Notes (Signed)
Deborah [name] [phone]   [phone]  OFFICE PROGRESS NOTE  [name], MD [place] [address]  DIAGNOSIS: stage IIIB (T3, N2, M0) non-small cell lung cancer, squamous cell carcinoma presented with large left lower lobe lung mass in addition to mediastinal lymphadenopathy diagnosed in October 2019. PDL 1 expression is negative  PRIOR THERAPY: Concurrent chemoradiation with weekly carboplatin for AUC of 2 and paclitaxel 45 mg/M2.  Status post 7 cycles with partial response.Marland Kitchen  CURRENT THERAPY: Consolidation treatment with immunotherapy with Imfinzi 10 mg/KG every 2 weeks.  First dose July 31, 2018.  INTERVAL HISTORY: Deborah [name] 63 y.o. female returns to the clinic today for follow-up visit accompanied by her husband.  The patient is feeling fine today with no concerning complaints.  She tolerated the course of concurrent chemoradiation fairly well.  She denied having any current chest pain but has shortness of breath at baseline increased with exertion with mild cough and no hemoptysis.  She denied having any current dysphagia or odynophagia.  She has no recent weight loss or night sweats.  She has no headache or visual changes.  She has no nausea, vomiting, diarrhea or constipation.  The patient had repeat CT scan of the chest performed recently and she is here for evaluation and discussion of her risk her results and treatment options.   MEDICAL HISTORY: Past Medical History:  Diagnosis Date  . Anemia    as teen  . Asthma   . CHF (congestive heart failure) ([place])   . COPD, mild ([place])   . Depression   . Diverticulitis   . Family history of anesthesia complication    vomiting  . GI bleeding   . Heart failure ([place])    New onset 07/25/14  . Histoplasmosis    left eye  . Hyperkalemia   . Hypertension   . Obesity (BMI 30-39.9)   . Pneumonia    dx wtih pneumonia on 05/27/16- seen by [facility]   . PONV  (postoperative nausea and vomiting)   . Restrictive lung disease   . Shortness of breath    with exertion   . Sleep apnea    mask and oxygen at nite for sleep at 2L   . Tobacco abuse   . Umbilical hernia     ALLERGIES:  has No Known Allergies.  MEDICATIONS:  Current Outpatient Medications  Medication Sig Dispense Refill  . acetaminophen (TYLENOL) 500 MG tablet Take 1,500 mg by mouth 2 (two) times daily as needed for moderate pain or headache.    . albuterol (PROVENTIL) (2.5 MG/3ML) 0.083% nebulizer solution USE 1 VIAL VIA NEBULIZER EVERY 3 HOURS AS NEEDED FOR WHEEZING OR SHORTNESS OF BREATH (Patient taking differently: Take 2.5 mg by nebulization every 3 (three) hours as needed for wheezing or shortness of breath. ) 1125 mL 1  . aspirin 81 MG chewable tablet Chew 1 tablet (81 mg total) by mouth daily. 30 tablet 1  . diltiazem (CARDIZEM CD) 120 MG 24 hr capsule TAKE 1 CAPSULE BY MOUTH EVERY DAY 90 capsule 1  . furosemide (LASIX) 20 MG tablet TAKE 2 TABLETS EACH MORNING AND 1 AT 3 PM 270 tablet 0  . ipratropium-albuterol (DUONEB) 0.5-2.5 (3) MG/3ML SOLN USE 1 VIAL VIA NEBULIZER EVERY 6 HOURS AS NEEDED (Patient taking differently: Inhale 3 mLs into the lungs every 6 (six) hours as needed (shortness of breath or wheezing). ) 360 mL 2  . montelukast (  SINGULAIR) 10 MG tablet TAKE 1 TABLET(10 MG) BY MOUTH AT BEDTIME 90 tablet 1  . OXYGEN Inhale 4 L/min into the lungs continuous.     . potassium chloride SA (KLOR-CON M20) 20 MEQ tablet TAKE 1/2 TABLET(10 MEQ) BY MOUTH DAILY 45 tablet 1  . prochlorperazine (COMPAZINE) 10 MG tablet TAKE 1 TABLET BY MOUTH EVERY 6 HOURS AS NEEDED FOR NAUSEA OR VOMITING. 30 tablet 0  . Spacer/Aero-Holding Chambers (AEROCHAMBER MV) inhaler Use as instructed 1 each 0  . VENTOLIN HFA 108 (90 Base) MCG/ACT inhaler INHALE 2 PUFFS BY MOUTH EVERY 6 HOURS AS NEEDED FOR WHEEZING OR SHORTNESS OF BREATH 18 g 5  . vitamin B-12 (CYANOCOBALAMIN) 500 MCG tablet Take 500 mcg by mouth  daily.     No current facility-administered medications for this visit.     SURGICAL HISTORY:  Past Surgical History:  Procedure Laterality Date  . APPLICATION OF WOUND VAC  03/19/2013   Procedure: APPLICATION OF WOUND VAC;  Surgeon: Madilyn Hook, DO;  Location: [facility];  Service: General;;  . CESAREAN SECTION  04/12/85  . COLONOSCOPY WITH PROPOFOL N/A 06/13/2016   Procedure: COLONOSCOPY WITH PROPOFOL;  Surgeon: Jerene Bears, MD;  Location: [facility];  Service: Gastroenterology;  Laterality: N/A;  . ERCP N/A 02/15/2017   Procedure: ENDOSCOPIC RETROGRADE CHOLANGIOPANCREATOGRAPHY (ERCP);  Surgeon: [name], MD;  Location: [facility];  Service: Endoscopy;  Laterality: N/A;  . INSERTION OF MESH N/A 03/19/2013   Procedure: INSERTION OF MESH;  Surgeon: Madilyn Hook, DO;  Location: [facility];  Service: General;  Laterality: N/A;  . VENTRAL HERNIA REPAIR N/A 03/19/2013   Procedure:  OPEN VENTRAL HERNIA REPAIR WITH MESH AND APPLICATION OF WOUND VAC;  Surgeon: Madilyn Hook, DO;  Location: [facility];  Service: General;  Laterality: N/A;  . VIDEO BRONCHOSCOPY Bilateral 04/19/2018   Procedure: VIDEO BRONCHOSCOPY WITH FLUORO;  Surgeon: Rigoberto Noel, MD;  Location: [facility];  Service: Cardiopulmonary;  Laterality: Bilateral;    REVIEW OF SYSTEMS:  Constitutional: positive for fatigue Eyes: negative Ears, nose, mouth, throat, and face: negative Respiratory: positive for dyspnea on exertion Cardiovascular: negative Gastrointestinal: negative Genitourinary:negative Integument/breast: negative Hematologic/lymphatic: negative Musculoskeletal:negative Neurological: negative Behavioral/Psych: negative Endocrine: negative Allergic/Immunologic: negative   PHYSICAL EXAMINATION: General appearance: alert, cooperative and no distress Head: Normocephalic, without obvious abnormality, atraumatic Neck: no adenopathy, no JVD, supple, symmetrical, trachea midline and thyroid not enlarged, symmetric, no  tenderness/mass/nodules Lymph nodes: Cervical, supraclavicular, and axillary nodes normal. Resp: clear to auscultation bilaterally Back: symmetric, no curvature. ROM normal. No CVA tenderness. Cardio: regular rate and rhythm, S1, S2 normal, no murmur, click, rub or gallop GI: soft, non-tender; bowel sounds normal; no masses,  no organomegaly Extremities: extremities normal, atraumatic, no cyanosis or edema Neurologic: Alert and oriented X 3, normal strength and tone. Normal symmetric reflexes. Normal coordination and gait  ECOG PERFORMANCE STATUS: 1 - Symptomatic but completely ambulatory  Blood pressure 101/74, pulse (!) 103, temperature 97.8 F (36.6 C), temperature source Oral, resp. rate (!) 26, height 5\' 3"  (1.6 m), weight 226 lb 4.8 oz (102.6 kg), SpO2 95 %.  LABORATORY DATA: Lab Results  Component Value Date   WBC 6.2 06/24/2018   HGB 10.9 (L) 06/24/2018   HCT 34.2 (L) 06/24/2018   MCV 92.4 06/24/2018   PLT 324 06/24/2018      Chemistry      Component Value Date/Time   NA 137 06/24/2018 1221   NA 143 11/23/2016 1504   K 4.4 06/24/2018 1221  CL 96 (L) 06/24/2018 1221   CO2 27 06/24/2018 1221   BUN 13 06/24/2018 1221   BUN 14 11/23/2016 1504   CREATININE 0.88 06/24/2018 1221      Component Value Date/Time   CALCIUM 9.4 06/24/2018 1221   ALKPHOS 91 06/24/2018 1221   AST 15 06/24/2018 1221   ALT 13 06/24/2018 1221   BILITOT 0.4 06/24/2018 1221       RADIOGRAPHIC STUDIES: Ct Chest W Contrast  Result Date: 07/19/2018 CLINICAL DATA:  63 year old female with history of left-sided lung cancer status post chemotherapy and radiation therapy completed in December 2019. Follow-up study. EXAM: CT CHEST WITH CONTRAST TECHNIQUE: Multidetector CT imaging of the chest was performed during intravenous contrast administration. CONTRAST:  19mL OMNIPAQUE IOHEXOL 300 MG/ML  SOLN COMPARISON:  PET-CT 04/18/2018. FINDINGS: Cardiovascular: Heart size is normal. There is no significant  pericardial fluid, thickening or pericardial calcification. There is aortic atherosclerosis, as well as atherosclerosis of the great vessels of the mediastinum and the coronary arteries, including calcified atherosclerotic plaque in the left anterior descending, left circumflex and right coronary arteries. Mediastinum/Nodes: No pathologically enlarged mediastinal or hilar lymph nodes. Esophagus is unremarkable in appearance. No axillary lymphadenopathy. Lungs/Pleura: In the left lower lobe at the site of the previously noted mass there is now a large cystic area which is predominantly thin walled, presumably reflective of a treated neoplasm. Areas of surrounding septal thickening and ground-glass attenuation are noted in adjacent portions of the left lower lobe, likely to reflect some evolving postradiation changes. Small calcified granulomas are noted in the right lower lobe. Diffuse bronchial wall thickening with moderate centrilobular and paraseptal emphysema. No acute consolidative airspace disease. No pleural effusions. Upper Abdomen: Gallbladder wall appears diffusely thickened and demonstrates increased enhancement. They are is a web-like area in the fundus of the gallbladder. The superior aspect of the gallbladder appears intimately associated with the undersurface of the liver, such that adhesion to or infiltration of the liver is not excluded. Very mild surrounding inflammatory changes are noted. No definite gallstones. Gallbladder does not appear distended. Musculoskeletal: There are no aggressive appearing lytic or blastic lesions noted in the visualized portions of the skeleton. IMPRESSION: 1. Today's study demonstrates a positive response to therapy with essentially complete resolution of the left lower lobe mass and adenopathy noted on the prior study. There are some evolving postradiation changes in the left lower lobe. No new suspicious appearing pulmonary nodules or masses are noted. 2. Interval  development of marked gallbladder wall thickening and enhancement. No gallstones are identified. Although there are subtle inflammatory changes around the gallbladder, the appearance is not classic for cholecystitis. The possibility of developing primary gallbladder neoplasm should be considered, and further surgical evaluation is recommended. Additional imaging with ultrasound or abdominal MRI/MRCP is unlikely to yield incremental value. Notably, the gallbladder does appear very intimately associated with the undersurface of the liver, such that adhesions or direct infiltration of this portion of the liver is not excluded. 3. Aortic atherosclerosis, in addition to three-vessel coronary artery disease. Please note that although the presence of coronary artery calcium documents the presence of coronary artery disease, the severity of this disease and any potential stenosis cannot be assessed on this non-gated CT examination. Assessment for potential risk factor modification, dietary therapy or pharmacologic therapy may be warranted, if clinically indicated. Aortic Atherosclerosis (ICD10-I70.0). Electronically Signed   By: Vinnie Langton M.D.   On: 07/19/2018 08:26    ASSESSMENT AND PLAN: This is a very pleasant 62  years old white female with stage IIIb non-small cell lung cancer, squamous cell carcinoma. She underwent a course of concurrent chemoradiation with weekly carboplatin and paclitaxel, status post 7 cycles.  Treated this treatment well with no concerning adverse effect except for mild odynophagia. The patient had repeat CT scan of the chest performed recently.  I personally and independently reviewed the scan images and discussed the result and showed the images to the patient and her husband.  Her scan showed significant improvement of her disease with almost complete resolution of the left lower lobe lung mass as well as mediastinal lymphadenopathy. I recommended for the patient to proceed with a  course of consolidation immunotherapy with Imfinzi 10 mg/kg every 2 weeks for a total of 1 year if she has no evidence for disease progression or unacceptable toxicity. I discussed with the patient the adverse effect of the immunotherapy including but not limited to immunotherapy mediated skin rash, diarrhea, inflammation of the lung, kidney, liver, thyroid or other endocrine dysfunction including type 1 diabetes mellitus. The patient is interested in proceeding with the immunotherapy and she is expected to start the first dose of this treatment next week. I will see her back for follow-up visit in 3 weeks for evaluation before starting cycle #2. The patient was advised to call immediately if she has any concerning symptoms in the interval.  The patient was advised to call immediately if she has any concerning symptoms in the interval. The patient voices understanding of current disease status and treatment options and is in agreement with the current care plan.  All questions were answered. The patient knows to call the clinic with any problems, questions or concerns. We can certainly see the patient much sooner if necessary.  Disclaimer: This note was dictated with voice recognition software. Similar sounding words can inadvertently be transcribed and may not be corrected upon review.

## 2018-07-22 NOTE — Progress Notes (Signed)
DISCONTINUE ON PATHWAY REGIMEN - Non-Small Cell Lung     Administer weekly:     Paclitaxel      Carboplatin   **Always confirm dose/schedule in your pharmacy ordering system**  REASON: Continuation Of Treatment PRIOR TREATMENT: [id number]: Carboplatin AUC=2 + Paclitaxel 45 mg/m2 Weekly During Radiation TREATMENT RESPONSE: Partial Response (PR)  START ON PATHWAY REGIMEN - Non-Small Cell Lung     A cycle is every 14 days:     Durvalumab   **Always confirm dose/schedule in your pharmacy ordering system**  Patient Characteristics: Stage III - Unresectable, PS = 0, 1 AJCC T Category: T3 Current Disease Status: No Distant Mets or Local Recurrence AJCC N Category: N2 AJCC M Category: M0 AJCC 8 Stage Grouping: IIIB Performance Status: PS = 0, 1 Intent of Therapy: Curative Intent, Discussed with Patient

## 2018-07-22 NOTE — Telephone Encounter (Signed)
Printed avs and calender of upcoming appointment. Per 1/6 los

## 2018-07-31 ENCOUNTER — Inpatient Hospital Stay: Payer: BLUE CROSS/BLUE SHIELD

## 2018-07-31 VITALS — BP 118/73 | HR 100 | Temp 97.7°F | Resp 17 | Wt 225.0 lb

## 2018-07-31 DIAGNOSIS — C3432 Malignant neoplasm of lower lobe, left bronchus or lung: Secondary | ICD-10-CM | POA: Diagnosis not present

## 2018-07-31 DIAGNOSIS — J449 Chronic obstructive pulmonary disease, unspecified: Secondary | ICD-10-CM | POA: Diagnosis not present

## 2018-07-31 DIAGNOSIS — I10 Essential (primary) hypertension: Secondary | ICD-10-CM | POA: Diagnosis not present

## 2018-07-31 DIAGNOSIS — Z5112 Encounter for antineoplastic immunotherapy: Secondary | ICD-10-CM | POA: Diagnosis not present

## 2018-07-31 LAB — CBC WITH DIFFERENTIAL (CANCER CENTER ONLY)
Abs Immature Granulocytes: 0.06 10*3/uL (ref 0.00–0.07)
Basophils Absolute: 0 10*3/uL (ref 0.0–0.1)
Basophils Relative: 0 %
Eosinophils Absolute: 0.2 10*3/uL (ref 0.0–0.5)
Eosinophils Relative: 3 %
HCT: 40.2 % (ref 36.0–46.0)
Hemoglobin: 12.6 g/dL (ref 12.0–15.0)
Immature Granulocytes: 1 %
Lymphocytes Relative: 13 %
Lymphs Abs: 1 10*3/uL (ref 0.7–4.0)
MCH: 31.5 pg (ref 26.0–34.0)
MCHC: 31.3 g/dL (ref 30.0–36.0)
MCV: 100.5 fL — ABNORMAL HIGH (ref 80.0–100.0)
Monocytes Absolute: 0.5 10*3/uL (ref 0.1–1.0)
Monocytes Relative: 7 %
Neutro Abs: 6 10*3/uL (ref 1.7–7.7)
Neutrophils Relative %: 76 %
Platelet Count: 285 10*3/uL (ref 150–400)
RBC: 4 MIL/uL (ref 3.87–5.11)
RDW: 16.5 % — ABNORMAL HIGH (ref 11.5–15.5)
WBC Count: 7.8 10*3/uL (ref 4.0–10.5)
nRBC: 0 % (ref 0.0–0.2)

## 2018-07-31 LAB — CMP (CANCER CENTER ONLY)
ALT: 12 U/L (ref 0–44)
AST: 14 U/L — ABNORMAL LOW (ref 15–41)
Albumin: 3.4 g/dL — ABNORMAL LOW (ref 3.5–5.0)
Alkaline Phosphatase: 86 U/L (ref 38–126)
Anion gap: 16 — ABNORMAL HIGH (ref 5–15)
BUN: 10 mg/dL (ref 6–20)
CO2: 24 mmol/L (ref 22–32)
Calcium: 9.6 mg/dL (ref 8.9–10.3)
Chloride: 99 mmol/L (ref 98–111)
Creatinine: 1.04 mg/dL — ABNORMAL HIGH (ref 0.44–1.00)
GFR, Est AFR Am: >60 mL/min (ref >60)
GFR, Estimated: 58 mL/min — ABNORMAL LOW (ref >60)
Glucose, Bld: 162 mg/dL — ABNORMAL HIGH (ref 70–99)
Potassium: 3.8 mmol/L (ref 3.5–5.1)
Sodium: 139 mmol/L (ref 135–145)
Total Bilirubin: 0.6 mg/dL (ref 0.3–1.2)
Total Protein: 8.4 g/dL — ABNORMAL HIGH (ref 6.5–8.1)

## 2018-07-31 LAB — TSH: TSH: 3.231 u[IU]/mL (ref 0.308–3.960)

## 2018-07-31 MED ORDER — SODIUM CHLORIDE 0.9 % IV SOLN
Freq: Once | INTRAVENOUS | Status: AC
  Administered 2018-07-31: 10:00:00 via INTRAVENOUS
  Filled 2018-07-31: qty 250

## 2018-07-31 MED ORDER — SODIUM CHLORIDE 0.9 % IV SOLN
1000.0000 mg | Freq: Once | INTRAVENOUS | Status: AC
  Administered 2018-07-31: 1000 mg via INTRAVENOUS
  Filled 2018-07-31: qty 20

## 2018-07-31 NOTE — Patient Instructions (Signed)
[facility] Discharge Instructions for Patients Receiving Chemotherapy  Today you received the following chemotherapy agents Durvalumab (IMFINZI).  To help prevent nausea and vomiting after your treatment, we encourage you to take your nausea medication as prescribed.   If you develop nausea and vomiting that is not controlled by your nausea medication, call the clinic.   BELOW ARE SYMPTOMS THAT SHOULD BE REPORTED IMMEDIATELY:  *FEVER GREATER THAN 100.5 F  *CHILLS WITH OR WITHOUT FEVER  NAUSEA AND VOMITING THAT IS NOT CONTROLLED WITH YOUR NAUSEA MEDICATION  *UNUSUAL SHORTNESS OF BREATH  *UNUSUAL BRUISING OR BLEEDING  TENDERNESS IN MOUTH AND THROAT WITH OR WITHOUT PRESENCE OF ULCERS  *URINARY PROBLEMS  *BOWEL PROBLEMS  UNUSUAL RASH Items with * indicate a potential emergency and should be followed up as soon as possible.  Feel free to call the clinic should you have any questions or concerns. The clinic phone number is [phone].  Please show the [facility] at check-in to the Emergency Department and triage nurse.   Durvalumab injection What is this medicine? DURVALUMAB (dur VAL ue mab) is a monoclonal antibody. It is used to treat urothelial cancer and lung cancer. This medicine may be used for other purposes; ask your health care provider or pharmacist if you have questions. COMMON BRAND NAME(S): IMFINZI What should I tell my health care provider before I take this medicine? They need to know if you have any of these conditions: -diabetes -immune system problems -infection -inflammatory bowel disease -kidney disease -liver disease -lung or breathing disease -lupus -organ transplant -stomach or intestine problems -thyroid disease -an unusual or allergic reaction to durvalumab, other medicines, foods, dyes, or preservatives -pregnant or trying to get pregnant -breast-feeding How should I use this medicine? This medicine is for infusion  into a vein. It is given by a health care professional in a hospital or clinic setting. A special MedGuide will be given to you before each treatment. Be sure to read this information carefully each time. Talk to your pediatrician regarding the use of this medicine in children. Special care may be needed. Overdosage: If you think you have taken too much of this medicine contact a poison control center or emergency room at once. NOTE: This medicine is only for you. Do not share this medicine with others. What if I miss a dose? It is important not to miss your dose. Call your doctor or health care professional if you are unable to keep an appointment. What may interact with this medicine? Interactions have not been studied. This list may not describe all possible interactions. Give your health care provider a list of all the medicines, herbs, non-prescription drugs, or dietary supplements you use. Also tell them if you smoke, drink alcohol, or use illegal drugs. Some items may interact with your medicine. What should I watch for while using this medicine? This drug may make you feel generally unwell. Continue your course of treatment even though you feel ill unless your doctor tells you to stop. You may need blood work done while you are taking this medicine. Do not become pregnant while taking this medicine or for 3 months after stopping it. Women should inform their doctor if they wish to become pregnant or think they might be pregnant. There is a potential for serious side effects to an unborn child. Talk to your health care professional or pharmacist for more information. Do not breast-feed an infant while taking this medicine or for 3 months after stopping  it. What side effects may I notice from receiving this medicine? Side effects that you should report to your doctor or health care professional as soon as possible: -allergic reactions like skin rash, itching or hives, swelling of the face,  lips, or tongue -black, tarry stools -bloody or watery diarrhea -breathing problems -change in emotions or moods -change in sex drive -changes in vision -chest pain or chest tightness -chills -confusion -cough -facial flushing -fever -headache -signs and symptoms of high blood sugar such as dizziness; dry mouth; dry skin; fruity breath; nausea; stomach pain; increased hunger or thirst; increased urination -signs and symptoms of liver injury like dark yellow or brown urine; general ill feeling or flu-like symptoms; light-colored stools; loss of appetite; nausea; right upper belly pain; unusually weak or tired; yellowing of the eyes or skin -stomach pain -trouble passing urine or change in the amount of urine -weight gain or weight loss Side effects that usually do not require medical attention (report these to your doctor or health care professional if they continue or are bothersome): -bone pain -constipation -loss of appetite -muscle pain -nausea -swelling of the ankles, feet, hands -tiredness This list may not describe all possible side effects. Call your doctor for medical advice about side effects. You may report side effects to FDA at 1-800-FDA-1088. Where should I keep my medicine? This drug is given in a hospital or clinic and will not be stored at home. NOTE: This sheet is a summary. It may not cover all possible information. If you have questions about this medicine, talk to your doctor, pharmacist, or health care provider.  2019 Elsevier/Gold Standard (2016-09-12 19:25:04)

## 2018-08-02 ENCOUNTER — Telehealth: Payer: Self-pay | Admitting: *Deleted

## 2018-08-02 ENCOUNTER — Other Ambulatory Visit: Payer: Self-pay | Admitting: Family Medicine

## 2018-08-02 NOTE — Telephone Encounter (Signed)
-----   Message from Georgianne Fick, RN sent at 07/31/2018 12:02 PM EST ----- Regarding: Dr. Julien Nordmann First Time Imfinzi Patient received first time [facility] today and tolerated this well.

## 2018-08-02 NOTE — Telephone Encounter (Signed)
TCT patient to follow up with her after her 1st Imfinzi treatment on 07/31/18.  Spoke with patient. She states she feels well. Denies any physical changes at this time and states she is grateful for that.  She is aware of her upcoming appts. And knows to call here if she has any change in condition or questions/concerns.

## 2018-08-05 ENCOUNTER — Ambulatory Visit: Payer: Self-pay | Admitting: Primary Care

## 2018-08-07 ENCOUNTER — Ambulatory Visit: Payer: BLUE CROSS/BLUE SHIELD | Admitting: Radiation Oncology

## 2018-08-14 ENCOUNTER — Inpatient Hospital Stay (HOSPITAL_BASED_OUTPATIENT_CLINIC_OR_DEPARTMENT_OTHER): Payer: BLUE CROSS/BLUE SHIELD | Admitting: Oncology

## 2018-08-14 ENCOUNTER — Encounter: Payer: Self-pay | Admitting: Oncology

## 2018-08-14 ENCOUNTER — Inpatient Hospital Stay: Payer: BLUE CROSS/BLUE SHIELD

## 2018-08-14 ENCOUNTER — Telehealth: Payer: Self-pay | Admitting: Internal Medicine

## 2018-08-14 VITALS — BP 111/84 | HR 122 | Temp 97.8°F | Resp 19 | Ht 63.0 in | Wt 228.6 lb

## 2018-08-14 DIAGNOSIS — Z5112 Encounter for antineoplastic immunotherapy: Secondary | ICD-10-CM | POA: Diagnosis not present

## 2018-08-14 DIAGNOSIS — J432 Centrilobular emphysema: Secondary | ICD-10-CM

## 2018-08-14 DIAGNOSIS — C3432 Malignant neoplasm of lower lobe, left bronchus or lung: Secondary | ICD-10-CM

## 2018-08-14 DIAGNOSIS — J449 Chronic obstructive pulmonary disease, unspecified: Secondary | ICD-10-CM | POA: Diagnosis not present

## 2018-08-14 DIAGNOSIS — R1310 Dysphagia, unspecified: Secondary | ICD-10-CM | POA: Diagnosis not present

## 2018-08-14 DIAGNOSIS — R062 Wheezing: Secondary | ICD-10-CM | POA: Diagnosis not present

## 2018-08-14 DIAGNOSIS — I10 Essential (primary) hypertension: Secondary | ICD-10-CM | POA: Diagnosis not present

## 2018-08-14 DIAGNOSIS — R000 Tachycardia, unspecified: Secondary | ICD-10-CM | POA: Diagnosis not present

## 2018-08-14 LAB — CBC WITH DIFFERENTIAL (CANCER CENTER ONLY)
Abs Immature Granulocytes: 0.05 10*3/uL (ref 0.00–0.07)
Basophils Absolute: 0.1 10*3/uL (ref 0.0–0.1)
Basophils Relative: 1 %
Eosinophils Absolute: 0.2 10*3/uL (ref 0.0–0.5)
Eosinophils Relative: 2 %
HCT: 38.7 % (ref 36.0–46.0)
Hemoglobin: 12.3 g/dL (ref 12.0–15.0)
Immature Granulocytes: 1 %
Lymphocytes Relative: 12 %
Lymphs Abs: 1.1 10*3/uL (ref 0.7–4.0)
MCH: 31.3 pg (ref 26.0–34.0)
MCHC: 31.8 g/dL (ref 30.0–36.0)
MCV: 98.5 fL (ref 80.0–100.0)
Monocytes Absolute: 0.6 10*3/uL (ref 0.1–1.0)
Monocytes Relative: 7 %
Neutro Abs: 6.8 10*3/uL (ref 1.7–7.7)
Neutrophils Relative %: 77 %
Platelet Count: 269 10*3/uL (ref 150–400)
RBC: 3.93 MIL/uL (ref 3.87–5.11)
RDW: 14.4 % (ref 11.5–15.5)
WBC Count: 8.7 10*3/uL (ref 4.0–10.5)
nRBC: 0 % (ref 0.0–0.2)

## 2018-08-14 LAB — CMP (CANCER CENTER ONLY)
ALT: 12 U/L (ref 0–44)
AST: 15 U/L (ref 15–41)
Albumin: 3.2 g/dL — ABNORMAL LOW (ref 3.5–5.0)
Alkaline Phosphatase: 78 U/L (ref 38–126)
Anion gap: 11 (ref 5–15)
BUN: 11 mg/dL (ref 6–20)
CO2: 27 mmol/L (ref 22–32)
Calcium: 9.5 mg/dL (ref 8.9–10.3)
Chloride: 102 mmol/L (ref 98–111)
Creatinine: 0.94 mg/dL (ref 0.44–1.00)
GFR, Est AFR Am: >60 mL/min (ref >60)
GFR, Estimated: >60 mL/min (ref >60)
Glucose, Bld: 116 mg/dL — ABNORMAL HIGH (ref 70–99)
Potassium: 3.8 mmol/L (ref 3.5–5.1)
Sodium: 140 mmol/L (ref 135–145)
Total Bilirubin: <0.2 mg/dL — ABNORMAL LOW (ref 0.3–1.2)
Total Protein: 7.8 g/dL (ref 6.5–8.1)

## 2018-08-14 LAB — TSH: TSH: 1.651 u[IU]/mL (ref 0.308–3.960)

## 2018-08-14 MED ORDER — SODIUM CHLORIDE 0.9 % IV SOLN
Freq: Once | INTRAVENOUS | Status: AC
  Administered 2018-08-14: 16:00:00 via INTRAVENOUS
  Filled 2018-08-14: qty 250

## 2018-08-14 MED ORDER — SODIUM CHLORIDE 0.9 % IV SOLN
9.8000 mg/kg | Freq: Once | INTRAVENOUS | Status: AC
  Administered 2018-08-14: 1000 mg via INTRAVENOUS
  Filled 2018-08-14: qty 20

## 2018-08-14 NOTE — Progress Notes (Signed)
Per Mikey Bussing, NP  Ok to treat with HR 120's.  Pt did not take diltiazem today.

## 2018-08-14 NOTE — Telephone Encounter (Signed)
Scheduled appt per 01/29 los. ° °Printed calendar and avs. °

## 2018-08-14 NOTE — Assessment & Plan Note (Addendum)
This is a very pleasant 63 year old white female with stage IIIb non-small cell lung cancer, squamous cell carcinoma. She underwenta course of concurrent chemoradiation with weekly carboplatin and paclitaxel, status post 7cycles. The patient responded well to therapy no concerning adverse effect except for mild odynophagia. She  showed significant improvement of her disease with almost complete resolution of the left lower lobe lung mass as well as mediastinal lymphadenopathy.    She is currently undergoing consolidation immunotherapy with Imfinzi 10 mg/Kg every 2 weeks for a total of 1 year if she has no evidence for disease progression or unacceptable toxicity. She is status post 1 cycle. She is tolerating treatment fairly well with no concerning complaints. Recommend for her to proceed with cycle #2 today as scheduled.   She will follow up in 2 weeks for evaluation prior to cycle #3.   The patient was found to be tachycardic with a heart rate of 122 bpm today. Upon further questioning, the patient states she did not take her Cardizem today. She was advised to take her medications as prescribed. She expressed understanding.  The patient also was found to have bilateral diffuse wheezing on physical exam. She was asymptomatic. She states she has nebulizer treatments at home which she was advised to take as needed for symptoms.   She was advised to call immediately if she has any concerning symptoms in the interval.  The patient voices understanding of current disease status and treatment options and is in agreement with the current care plan.  All questions were answered. The patient knows to call the clinic with any problems, questions or concerns. We can certainly see the patient much sooner if necessary.

## 2018-08-14 NOTE — Progress Notes (Signed)
[place] OFFICE PROGRESS NOTE  Deborah Passy, MD [place] [id number]  DIAGNOSIS: stage IIIB (T3, N2, M0) non-small cell lung cancer, squamous cell carcinoma presented with large left lower lobe lung mass in addition to mediastinal lymphadenopathy diagnosed in October 2019. PDL 1 expression is negative  PRIOR THERAPY: Concurrent chemoradiation with weekly carboplatin for AUC of 2 and paclitaxel 45 mg/M2.  Status post 7 cycles with partial response.  CURRENT THERAPY: Consolidation treatment with immunotherapy with Imfinzi 10 mg/KG every 2 weeks.  First dose July 31, 2018.  Status post 1 cycle.  INTERVAL HISTORY: Deborah [name] 63 y.o. female returns for a follow-up visit accompanied by her husband, [name]. The patient is feeling well today and has no specific complaints. She tolerated her first treatment with Imfinzi fairly well. She reports baseline shortness of breath increased with exertion with a mild cough and no hemoptysis. She denies chest pain. She denies any fever, chills, constipation, or diarrhea. She denies recent weight loss, fevers, chills, or night sweats. She denies any rashes. The patient denies any visual changes or headache. The patient is here for evaluation prior to cycle #2 of Imfinzi.   MEDICAL HISTORY: Past Medical History:  Diagnosis Date  . Anemia    as teen  . Asthma   . CHF (congestive heart failure) ([place])   . COPD, mild ([place])   . Depression   . Diverticulitis   . Family history of anesthesia complication    vomiting  . GI bleeding   . Heart failure ([place])    New onset 07/25/14  . Histoplasmosis    left eye  . Hyperkalemia   . Hypertension   . Obesity (BMI 30-39.9)   . Pneumonia    dx wtih pneumonia on 05/27/16- seen by [facility]   . PONV (postoperative nausea and vomiting)   . Restrictive lung disease   . Shortness of breath    with exertion   . Sleep apnea    mask and oxygen at nite for sleep at 2L    . Tobacco abuse   . Umbilical hernia     ALLERGIES:  has No Known Allergies.  MEDICATIONS:  Current Outpatient Medications  Medication Sig Dispense Refill  . acetaminophen (TYLENOL) 500 MG tablet Take 1,500 mg by mouth 2 (two) times daily as needed for moderate pain or headache.    . albuterol (PROVENTIL) (2.5 MG/3ML) 0.083% nebulizer solution USE 1 VIAL VIA NEBULIZER EVERY 3 HOURS AS NEEDED FOR WHEEZING OR SHORTNESS OF BREATH (Patient taking differently: Take 2.5 mg by nebulization every 3 (three) hours as needed for wheezing or shortness of breath. ) 1125 mL 1  . aspirin 81 MG chewable tablet Chew 1 tablet (81 mg total) by mouth daily. 30 tablet 1  . Cholecalciferol (D3 ADULT PO) Take 1,000 mg by mouth.    . diltiazem (CARDIZEM CD) 120 MG 24 hr capsule TAKE 1 CAPSULE BY MOUTH EVERY DAY 90 capsule 1  . furosemide (LASIX) 20 MG tablet TAKE 2 TABLETS EACH MORNING AND 1 AT 3 PM 270 tablet 0  . ipratropium-albuterol (DUONEB) 0.5-2.5 (3) MG/3ML SOLN USE 1 VIAL VIA NEBULIZER EVERY 6 HOURS AS NEEDED (Patient taking differently: Inhale 3 mLs into the lungs every 6 (six) hours as needed (shortness of breath or wheezing). ) 360 mL 2  . montelukast (SINGULAIR) 10 MG tablet TAKE 1 TABLET(10 MG) BY MOUTH AT BEDTIME 90 tablet 1  . OXYGEN Inhale 4 L/min into  the lungs continuous.     . potassium chloride SA (KLOR-CON M20) 20 MEQ tablet TAKE 1/2 TABLET(10 MEQ) BY MOUTH DAILY 45 tablet 1  . Spacer/Aero-Holding Chambers (AEROCHAMBER MV) inhaler Use as instructed 1 each 0  . VENTOLIN HFA 108 (90 Base) MCG/ACT inhaler INHALE 2 PUFFS BY MOUTH EVERY 6 HOURS AS NEEDED FOR WHEEZING OR SHORTNESS OF BREATH 18 g 5  . vitamin B-12 (CYANOCOBALAMIN) 500 MCG tablet Take 500 mcg by mouth daily.    . prochlorperazine (COMPAZINE) 10 MG tablet TAKE 1 TABLET BY MOUTH EVERY 6 HOURS AS NEEDED FOR NAUSEA OR VOMITING. (Patient not taking: Reported on 08/14/2018) 30 tablet 0   No current facility-administered medications for this  visit.     SURGICAL HISTORY:  Past Surgical History:  Procedure Laterality Date  . APPLICATION OF WOUND VAC  03/19/2013   Procedure: APPLICATION OF WOUND VAC;  Surgeon: Madilyn Hook, DO;  Location: [facility];  Service: General;;  . CESAREAN SECTION  04/12/85  . COLONOSCOPY WITH PROPOFOL N/A 06/13/2016   Procedure: COLONOSCOPY WITH PROPOFOL;  Surgeon: Jerene Bears, MD;  Location: [facility];  Service: Gastroenterology;  Laterality: N/A;  . ERCP N/A 02/15/2017   Procedure: ENDOSCOPIC RETROGRADE CHOLANGIOPANCREATOGRAPHY (ERCP);  Surgeon: [name], MD;  Location: [facility];  Service: Endoscopy;  Laterality: N/A;  . INSERTION OF MESH N/A 03/19/2013   Procedure: INSERTION OF MESH;  Surgeon: Madilyn Hook, DO;  Location: [facility];  Service: General;  Laterality: N/A;  . VENTRAL HERNIA REPAIR N/A 03/19/2013   Procedure:  OPEN VENTRAL HERNIA REPAIR WITH MESH AND APPLICATION OF WOUND VAC;  Surgeon: Madilyn Hook, DO;  Location: [facility];  Service: General;  Laterality: N/A;  . VIDEO BRONCHOSCOPY Bilateral 04/19/2018   Procedure: VIDEO BRONCHOSCOPY WITH FLUORO;  Surgeon: Rigoberto Noel, MD;  Location: [facility];  Service: Cardiopulmonary;  Laterality: Bilateral;    REVIEW OF SYSTEMS:   Review of Systems  Constitutional: Negative for appetite change, chills, fatigue, fever and unexpected weight change.  HENT: Negative for mouth sores, nosebleeds, sore throat and trouble swallowing.   Eyes: Negative for eye problems and icterus.  Respiratory: Positive for cough and shortness of breath with exertion which is present at baseline. Denies hemoptysis and wheezing.   Cardiovascular: Negative for chest pain and leg swelling.  Gastrointestinal: Negative for abdominal pain, constipation, diarrhea, nausea and vomiting.  Genitourinary: Negative for bladder incontinence, difficulty urinating, dysuria, frequency and hematuria.   Musculoskeletal: Negative for back pain, gait problem, neck pain and neck stiffness.   Skin: Negative for itching and rash.  Neurological: Negative for dizziness, extremity weakness, gait problem, headaches, light-headedness and seizures.  Hematological: Negative for adenopathy. Does not bruise/bleed easily.  Psychiatric/Behavioral: Negative for confusion, depression and sleep disturbance. The patient is not nervous/anxious.     PHYSICAL EXAMINATION:  Blood pressure 111/84, pulse (!) 122, temperature 97.8 F (36.6 C), temperature source Oral, resp. rate 19, height 5\' 3"  (1.6 m), weight 228 lb 9.6 oz (103.7 kg), SpO2 98 %.  ECOG PERFORMANCE STATUS: 1 - Symptomatic but completely ambulatory  Physical Exam  Constitutional: Oriented to person, place, and time and well-developed, well-nourished, and in no distress. No distress.  HENT:  Head: Normocephalic and atraumatic.  Mouth/Throat: Oropharynx is clear and moist. No oropharyngeal exudate.  Eyes: Conjunctivae are normal. Right eye exhibits no discharge. Left eye exhibits no discharge. No scleral icterus.  Neck: Normal range of motion. Neck supple.  Cardiovascular: Tachycardic, regular rhythm, normal heart sounds and intact distal  pulses.   Pulmonary/Chest: Diffuse wheezes bilaterally. Normal respiratory effort. No respiratory distress. No rales.  Abdominal: Soft. Bowel sounds are normal. Exhibits no distension and no mass. There is no tenderness.  Musculoskeletal: Normal range of motion. Exhibits no edema.  Lymphadenopathy:    No cervical adenopathy.  Neurological: Alert and oriented to person, place, and time. Exhibits normal muscle tone. Gait normal. Coordination normal.  Skin: Skin is warm and dry. No rash noted. Not diaphoretic. No erythema. No pallor.  Psychiatric: Mood, memory and judgment normal.  Vitals reviewed.  LABORATORY DATA: Lab Results  Component Value Date   WBC 8.7 08/14/2018   HGB 12.3 08/14/2018   HCT 38.7 08/14/2018   MCV 98.5 08/14/2018   PLT 269 08/14/2018      Chemistry      Component  Value Date/Time   NA 140 08/14/2018 1405   NA 143 11/23/2016 1504   K 3.8 08/14/2018 1405   CL 102 08/14/2018 1405   CO2 27 08/14/2018 1405   BUN 11 08/14/2018 1405   BUN 14 11/23/2016 1504   CREATININE 0.94 08/14/2018 1405      Component Value Date/Time   CALCIUM 9.5 08/14/2018 1405   ALKPHOS 78 08/14/2018 1405   AST 15 08/14/2018 1405   ALT 12 08/14/2018 1405   BILITOT <0.2 (L) 08/14/2018 1405       RADIOGRAPHIC STUDIES:  Ct Chest W Contrast  Result Date: 07/19/2018 CLINICAL DATA:  63 year old female with history of left-sided lung cancer status post chemotherapy and radiation therapy completed in December 2019. Follow-up study. EXAM: CT CHEST WITH CONTRAST TECHNIQUE: Multidetector CT imaging of the chest was performed during intravenous contrast administration. CONTRAST:  22mL OMNIPAQUE IOHEXOL 300 MG/ML  SOLN COMPARISON:  PET-CT 04/18/2018. FINDINGS: Cardiovascular: Heart size is normal. There is no significant pericardial fluid, thickening or pericardial calcification. There is aortic atherosclerosis, as well as atherosclerosis of the great vessels of the mediastinum and the coronary arteries, including calcified atherosclerotic plaque in the left anterior descending, left circumflex and right coronary arteries. Mediastinum/Nodes: No pathologically enlarged mediastinal or hilar lymph nodes. Esophagus is unremarkable in appearance. No axillary lymphadenopathy. Lungs/Pleura: In the left lower lobe at the site of the previously noted mass there is now a large cystic area which is predominantly thin walled, presumably reflective of a treated neoplasm. Areas of surrounding septal thickening and ground-glass attenuation are noted in adjacent portions of the left lower lobe, likely to reflect some evolving postradiation changes. Small calcified granulomas are noted in the right lower lobe. Diffuse bronchial wall thickening with moderate centrilobular and paraseptal emphysema. No acute  consolidative airspace disease. No pleural effusions. Upper Abdomen: Gallbladder wall appears diffusely thickened and demonstrates increased enhancement. They are is a web-like area in the fundus of the gallbladder. The superior aspect of the gallbladder appears intimately associated with the undersurface of the liver, such that adhesion to or infiltration of the liver is not excluded. Very mild surrounding inflammatory changes are noted. No definite gallstones. Gallbladder does not appear distended. Musculoskeletal: There are no aggressive appearing lytic or blastic lesions noted in the visualized portions of the skeleton. IMPRESSION: 1. Today's study demonstrates a positive response to therapy with essentially complete resolution of the left lower lobe mass and adenopathy noted on the prior study. There are some evolving postradiation changes in the left lower lobe. No new suspicious appearing pulmonary nodules or masses are noted. 2. Interval development of marked gallbladder wall thickening and enhancement. No gallstones are identified. Although there  are subtle inflammatory changes around the gallbladder, the appearance is not classic for cholecystitis. The possibility of developing primary gallbladder neoplasm should be considered, and further surgical evaluation is recommended. Additional imaging with ultrasound or abdominal MRI/MRCP is unlikely to yield incremental value. Notably, the gallbladder does appear very intimately associated with the undersurface of the liver, such that adhesions or direct infiltration of this portion of the liver is not excluded. 3. Aortic atherosclerosis, in addition to three-vessel coronary artery disease. Please note that although the presence of coronary artery calcium documents the presence of coronary artery disease, the severity of this disease and any potential stenosis cannot be assessed on this non-gated CT examination. Assessment for potential risk factor modification,  dietary therapy or pharmacologic therapy may be warranted, if clinically indicated. Aortic Atherosclerosis (ICD10-I70.0). Electronically Signed   By: Vinnie Langton M.D.   On: 07/19/2018 08:26     ASSESSMENT/PLAN:  Primary malignant neoplasm of bronchus of left lower lobe [facility]) This is a very pleasant 63 year old white female with stage IIIb non-small cell lung cancer, squamous cell carcinoma. She underwenta course of concurrent chemoradiation with weekly carboplatin and paclitaxel, status post 7cycles. The patient responded well to therapy no concerning adverse effect except for mild odynophagia. She  showed significant improvement of her disease with almost complete resolution of the left lower lobe lung mass as well as mediastinal lymphadenopathy.    She is currently undergoing consolidation immunotherapy with Imfinzi 10 mg/Kg every 2 weeks for a total of 1 year if she has no evidence for disease progression or unacceptable toxicity. She is status post 1 cycle. She is tolerating treatment fairly well with no concerning complaints. Recommend for her to proceed with cycle #2 today as scheduled.   She will follow up in 2 weeks for evaluation prior to cycle #3.   The patient was found to be tachycardic with a heart rate of 122 bpm today. Upon further questioning, the patient states she did not take her Cardizem today. She was advised to take her medications as prescribed. She expressed understanding.  The patient also was found to have bilateral diffuse wheezing on physical exam. She was asymptomatic. She states she has nebulizer treatments at home which she was advised to take as needed for symptoms.   She was advised to call immediately if she has any concerning symptoms in the interval.  The patient voices understanding of current disease status and treatment options and is in agreement with the current care plan.  All questions were answered. The patient knows to call the clinic with  any problems, questions or concerns. We can certainly see the patient much sooner if necessary.      No orders of the defined types were placed in this encounter.    Mikey Bussing, DNP, AGPCNP-BC, AOCNP 08/14/18

## 2018-08-14 NOTE — Patient Instructions (Signed)
[place] [facility] Discharge Instructions for Patients Receiving Chemotherapy  Today you received the following chemotherapy agents Durvalumab (IMFINZI).  To help prevent nausea and vomiting after your treatment, we encourage you to take your nausea medication as prescribed.   If you develop nausea and vomiting that is not controlled by your nausea medication, call the clinic.   BELOW ARE SYMPTOMS THAT SHOULD BE REPORTED IMMEDIATELY:  *FEVER GREATER THAN 100.5 F  *CHILLS WITH OR WITHOUT FEVER  NAUSEA AND VOMITING THAT IS NOT CONTROLLED WITH YOUR NAUSEA MEDICATION  *UNUSUAL SHORTNESS OF BREATH  *UNUSUAL BRUISING OR BLEEDING  TENDERNESS IN MOUTH AND THROAT WITH OR WITHOUT PRESENCE OF ULCERS  *URINARY PROBLEMS  *BOWEL PROBLEMS  UNUSUAL RASH Items with * indicate a potential emergency and should be followed up as soon as possible.  Feel free to call the clinic should you have any questions or concerns. The clinic phone number is [phone].  Please show the [place] at check-in to the Emergency Department and triage nurse.

## 2018-08-23 ENCOUNTER — Telehealth: Payer: Self-pay | Admitting: Internal Medicine

## 2018-08-23 NOTE — Telephone Encounter (Signed)
Called again and spoke with patient re 2/11 appointment change with new time.

## 2018-08-23 NOTE — Telephone Encounter (Signed)
MM PAL 2/11 - moved f/u to AM with [facility]. Left message for patient.

## 2018-08-27 ENCOUNTER — Inpatient Hospital Stay: Payer: BLUE CROSS/BLUE SHIELD | Attending: Internal Medicine | Admitting: Physician Assistant

## 2018-08-27 ENCOUNTER — Other Ambulatory Visit: Payer: Self-pay

## 2018-08-27 ENCOUNTER — Inpatient Hospital Stay: Payer: BLUE CROSS/BLUE SHIELD

## 2018-08-27 ENCOUNTER — Encounter: Payer: Self-pay | Admitting: Physician Assistant

## 2018-08-27 ENCOUNTER — Ambulatory Visit: Payer: Self-pay

## 2018-08-27 VITALS — BP 118/86 | HR 97 | Temp 97.6°F | Resp 18 | Ht 63.0 in | Wt 231.8 lb

## 2018-08-27 DIAGNOSIS — Z7982 Long term (current) use of aspirin: Secondary | ICD-10-CM | POA: Diagnosis not present

## 2018-08-27 DIAGNOSIS — Z79899 Other long term (current) drug therapy: Secondary | ICD-10-CM

## 2018-08-27 DIAGNOSIS — J449 Chronic obstructive pulmonary disease, unspecified: Secondary | ICD-10-CM

## 2018-08-27 DIAGNOSIS — Z5112 Encounter for antineoplastic immunotherapy: Secondary | ICD-10-CM | POA: Diagnosis not present

## 2018-08-27 DIAGNOSIS — Z9981 Dependence on supplemental oxygen: Secondary | ICD-10-CM | POA: Diagnosis not present

## 2018-08-27 DIAGNOSIS — C3432 Malignant neoplasm of lower lobe, left bronchus or lung: Secondary | ICD-10-CM

## 2018-08-27 DIAGNOSIS — Z87891 Personal history of nicotine dependence: Secondary | ICD-10-CM | POA: Diagnosis not present

## 2018-08-27 DIAGNOSIS — I10 Essential (primary) hypertension: Secondary | ICD-10-CM | POA: Diagnosis not present

## 2018-08-27 LAB — CMP (CANCER CENTER ONLY)
ALT: 11 U/L (ref 0–44)
AST: 14 U/L — ABNORMAL LOW (ref 15–41)
Albumin: 3.3 g/dL — ABNORMAL LOW (ref 3.5–5.0)
Alkaline Phosphatase: 78 U/L (ref 38–126)
Anion gap: 13 (ref 5–15)
BUN: 15 mg/dL (ref 6–20)
CO2: 26 mmol/L (ref 22–32)
Calcium: 9.4 mg/dL (ref 8.9–10.3)
Chloride: 101 mmol/L (ref 98–111)
Creatinine: 0.97 mg/dL (ref 0.44–1.00)
GFR, Est AFR Am: >60 mL/min (ref >60)
GFR, Estimated: >60 mL/min (ref >60)
Glucose, Bld: 98 mg/dL (ref 70–99)
Potassium: 3.7 mmol/L (ref 3.5–5.1)
Sodium: 140 mmol/L (ref 135–145)
Total Bilirubin: 0.2 mg/dL — ABNORMAL LOW (ref 0.3–1.2)
Total Protein: 8 g/dL (ref 6.5–8.1)

## 2018-08-27 LAB — CBC WITH DIFFERENTIAL (CANCER CENTER ONLY)
Abs Immature Granulocytes: 0.03 10*3/uL (ref 0.00–0.07)
Basophils Absolute: 0.1 10*3/uL (ref 0.0–0.1)
Basophils Relative: 1 %
Eosinophils Absolute: 0.2 10*3/uL (ref 0.0–0.5)
Eosinophils Relative: 2 %
HCT: 39.6 % (ref 36.0–46.0)
Hemoglobin: 12.4 g/dL (ref 12.0–15.0)
Immature Granulocytes: 0 %
Lymphocytes Relative: 12 %
Lymphs Abs: 1 10*3/uL (ref 0.7–4.0)
MCH: 31 pg (ref 26.0–34.0)
MCHC: 31.3 g/dL (ref 30.0–36.0)
MCV: 99 fL (ref 80.0–100.0)
Monocytes Absolute: 0.6 10*3/uL (ref 0.1–1.0)
Monocytes Relative: 8 %
Neutro Abs: 6 10*3/uL (ref 1.7–7.7)
Neutrophils Relative %: 77 %
Platelet Count: 255 10*3/uL (ref 150–400)
RBC: 4 MIL/uL (ref 3.87–5.11)
RDW: 13.5 % (ref 11.5–15.5)
WBC Count: 7.9 10*3/uL (ref 4.0–10.5)
nRBC: 0 % (ref 0.0–0.2)

## 2018-08-27 LAB — TSH: TSH: 3.977 u[IU]/mL — ABNORMAL HIGH (ref 0.308–3.960)

## 2018-08-27 MED ORDER — SODIUM CHLORIDE 0.9 % IV SOLN
Freq: Once | INTRAVENOUS | Status: AC
  Administered 2018-08-27: 11:00:00 via INTRAVENOUS
  Filled 2018-08-27: qty 250

## 2018-08-27 MED ORDER — SODIUM CHLORIDE 0.9 % IV SOLN
9.8000 mg/kg | Freq: Once | INTRAVENOUS | Status: AC
  Administered 2018-08-27: 1000 mg via INTRAVENOUS
  Filled 2018-08-27: qty 20

## 2018-08-27 NOTE — Patient Instructions (Signed)
[facility] Discharge Instructions for Patients Receiving Chemotherapy  Today you received the following chemotherapy agents Durvalumab (IMFINZI).  To help prevent nausea and vomiting after your treatment, we encourage you to take your nausea medication as prescribed.   If you develop nausea and vomiting that is not controlled by your nausea medication, call the clinic.   BELOW ARE SYMPTOMS THAT SHOULD BE REPORTED IMMEDIATELY:  *FEVER GREATER THAN 100.5 F  *CHILLS WITH OR WITHOUT FEVER  NAUSEA AND VOMITING THAT IS NOT CONTROLLED WITH YOUR NAUSEA MEDICATION  *UNUSUAL SHORTNESS OF BREATH  *UNUSUAL BRUISING OR BLEEDING  TENDERNESS IN MOUTH AND THROAT WITH OR WITHOUT PRESENCE OF ULCERS  *URINARY PROBLEMS  *BOWEL PROBLEMS  UNUSUAL RASH Items with * indicate a potential emergency and should be followed up as soon as possible.  Feel free to call the clinic should you have any questions or concerns. The clinic phone number is [phone].  Please show the [place] at check-in to the Emergency Department and triage nurse.

## 2018-08-27 NOTE — Progress Notes (Signed)
[facility] OFFICE PROGRESS NOTE  Deborah Passy, MD [place] [address]  DIAGNOSIS: stage IIIB (T3, N2, M0) non-small cell lung cancer, squamous cell carcinoma presented with large left lower lobe lung mass in addition to mediastinal lymphadenopathy diagnosed in October 2019. PDL 1 expression is negative  PRIOR THERAPY: Concurrent chemoradiation with weekly carboplatin for AUC of 2 and paclitaxel 45 mg/M2. Status post 7cycles with partial response.  CURRENT THERAPY: Consolidation treatment with immunotherapy with Imfinzi 10 mg/KG every 2 weeks. First dose July 31, 2018.  Status post 2 cycle.  INTERVAL HISTORY: Deborah [name] 62 y.o. female returns to the clinic for a follow-up visit accompanied by her husband. The patient is feeling well today with no concerning complaints. She is tolerating treatment with Imfinzi well with no adverse effects. She denies fever, chills, night sweats, weight loss. She denies diarrhea, constipation, nausea, or vomiting. She denies cough, hemoptysis, or chest pain. She denies headache and visual changes. She denies any rashes or pruritus. She is here for evaluation prior to cycle #3 of Imfinzi today.    MEDICAL HISTORY: Past Medical History:  Diagnosis Date  . Anemia    as teen  . Asthma   . CHF (congestive heart failure) ([place])   . COPD, mild ([place])   . Depression   . Diverticulitis   . Family history of anesthesia complication    vomiting  . GI bleeding   . Heart failure ([place])    New onset 07/25/14  . Histoplasmosis    left eye  . Hyperkalemia   . Hypertension   . Obesity (BMI 30-39.9)   . Pneumonia    dx wtih pneumonia on 05/27/16- seen by [facility]   . PONV (postoperative nausea and vomiting)   . Restrictive lung disease   . Shortness of breath    with exertion   . Sleep apnea    mask and oxygen at nite for sleep at 2L   . Tobacco abuse   . Umbilical hernia     ALLERGIES:  has No Known  Allergies.  MEDICATIONS:  Current Outpatient Medications  Medication Sig Dispense Refill  . acetaminophen (TYLENOL) 500 MG tablet Take 1,500 mg by mouth 2 (two) times daily as needed for moderate pain or headache.    . albuterol (PROVENTIL) (2.5 MG/3ML) 0.083% nebulizer solution USE 1 VIAL VIA NEBULIZER EVERY 3 HOURS AS NEEDED FOR WHEEZING OR SHORTNESS OF BREATH (Patient taking differently: Take 2.5 mg by nebulization every 3 (three) hours as needed for wheezing or shortness of breath. ) 1125 mL 1  . aspirin 81 MG chewable tablet Chew 1 tablet (81 mg total) by mouth daily. 30 tablet 1  . Cholecalciferol (D3 ADULT PO) Take 1,000 mg by mouth.    . diltiazem (CARDIZEM CD) 120 MG 24 hr capsule TAKE 1 CAPSULE BY MOUTH EVERY DAY 90 capsule 1  . furosemide (LASIX) 20 MG tablet TAKE 2 TABLETS EACH MORNING AND 1 AT 3 PM 270 tablet 0  . ipratropium-albuterol (DUONEB) 0.5-2.5 (3) MG/3ML SOLN USE 1 VIAL VIA NEBULIZER EVERY 6 HOURS AS NEEDED (Patient taking differently: Inhale 3 mLs into the lungs every 6 (six) hours as needed (shortness of breath or wheezing). ) 360 mL 2  . montelukast (SINGULAIR) 10 MG tablet TAKE 1 TABLET(10 MG) BY MOUTH AT BEDTIME 90 tablet 1  . OXYGEN Inhale 4 L/min into the lungs continuous.     . potassium chloride SA (KLOR-CON M20) 20 MEQ  tablet TAKE 1/2 TABLET(10 MEQ) BY MOUTH DAILY 45 tablet 1  . Spacer/Aero-Holding Chambers (AEROCHAMBER MV) inhaler Use as instructed 1 each 0  . VENTOLIN HFA 108 (90 Base) MCG/ACT inhaler INHALE 2 PUFFS BY MOUTH EVERY 6 HOURS AS NEEDED FOR WHEEZING OR SHORTNESS OF BREATH 18 g 5  . vitamin B-12 (CYANOCOBALAMIN) 500 MCG tablet Take 500 mcg by mouth daily.    . prochlorperazine (COMPAZINE) 10 MG tablet TAKE 1 TABLET BY MOUTH EVERY 6 HOURS AS NEEDED FOR NAUSEA OR VOMITING. (Patient not taking: Reported on 08/14/2018) 30 tablet 0   No current facility-administered medications for this visit.     SURGICAL HISTORY:  Past Surgical History:  Procedure  Laterality Date  . APPLICATION OF WOUND VAC  03/19/2013   Procedure: APPLICATION OF WOUND VAC;  Surgeon: Madilyn Hook, DO;  Location: [facility];  Service: General;;  . CESAREAN SECTION  04/12/85  . COLONOSCOPY WITH PROPOFOL N/A 06/13/2016   Procedure: COLONOSCOPY WITH PROPOFOL;  Surgeon: Jerene Bears, MD;  Location: [facility];  Service: Gastroenterology;  Laterality: N/A;  . ERCP N/A 02/15/2017   Procedure: ENDOSCOPIC RETROGRADE CHOLANGIOPANCREATOGRAPHY (ERCP);  Surgeon: [name], MD;  Location: [facility];  Service: Endoscopy;  Laterality: N/A;  . INSERTION OF MESH N/A 03/19/2013   Procedure: INSERTION OF MESH;  Surgeon: Madilyn Hook, DO;  Location: [facility];  Service: General;  Laterality: N/A;  . VENTRAL HERNIA REPAIR N/A 03/19/2013   Procedure:  OPEN VENTRAL HERNIA REPAIR WITH MESH AND APPLICATION OF WOUND VAC;  Surgeon: Madilyn Hook, DO;  Location: [facility];  Service: General;  Laterality: N/A;  . VIDEO BRONCHOSCOPY Bilateral 04/19/2018   Procedure: VIDEO BRONCHOSCOPY WITH FLUORO;  Surgeon: Rigoberto Noel, MD;  Location: [facility];  Service: Cardiopulmonary;  Laterality: Bilateral;    REVIEW OF SYSTEMS:   Review of Systems  Constitutional: Negative for appetite change, chills, fatigue, fever and unexpected weight change.  HENT:   Negative for mouth sores, nosebleeds, sore throat and trouble swallowing.   Eyes: Negative for eye problems and icterus.  Respiratory: Positive for baseline wheezing. Negative for cough, hemoptysis, shortness of breath. Cardiovascular: Negative for chest pain and leg swelling.  Gastrointestinal: Negative for abdominal pain, constipation, diarrhea, nausea and vomiting.  Genitourinary: Negative for bladder incontinence, difficulty urinating, dysuria, frequency and hematuria.   Musculoskeletal: Negative for back pain, gait problem, neck pain and neck stiffness.  Skin: Negative for itching and rash.  Neurological: Negative for dizziness, extremity weakness, gait  problem, headaches, light-headedness and seizures.  Hematological: Negative for adenopathy. Does not bruise/bleed easily.  Psychiatric/Behavioral: Negative for confusion, depression and sleep disturbance. The patient is not nervous/anxious.     PHYSICAL EXAMINATION:  Blood pressure 118/86, pulse 97, temperature 97.6 F (36.4 C), temperature source Oral, resp. rate 18, height 5\' 3"  (1.6 m), weight 231 lb 12.8 oz (105.1 kg), SpO2 92 %.  ECOG PERFORMANCE STATUS: 1 - Symptomatic but completely ambulatory  Physical Exam  Constitutional: Oriented to person, place, and time and well-developed, well-nourished, and in no distress. No distress.  HENT:  Head: Normocephalic and atraumatic.  Mouth/Throat: Oropharynx is clear and moist. No oropharyngeal exudate.  Eyes: Conjunctivae are normal. Right eye exhibits no discharge. Left eye exhibits no discharge. No scleral icterus.  Neck: Normal range of motion. Neck supple.  Cardiovascular: Normal rate, regular rhythm, normal heart sounds and intact distal pulses.   Pulmonary/Chest: Diffuse bilateral wheezing. Effort normal and breath sounds normal. No respiratory distress.No rales. Patient on 2L oxygen via  nasal cannula.   Abdominal: Soft. Bowel sounds are normal. Exhibits no distension and no mass. There is no tenderness.  Musculoskeletal: Normal range of motion. Exhibits no edema.  Lymphadenopathy:    No cervical adenopathy.  Neurological: Alert and oriented to person, place, and time. Exhibits normal muscle tone. Gait normal. Coordination normal.  Skin: Skin is warm and dry. No rash noted. Not diaphoretic. No erythema. No pallor.  Psychiatric: Mood, memory and judgment normal.  Vitals reviewed.  LABORATORY DATA: Lab Results  Component Value Date   WBC 7.9 08/27/2018   HGB 12.4 08/27/2018   HCT 39.6 08/27/2018   MCV 99.0 08/27/2018   PLT 255 08/27/2018      Chemistry      Component Value Date/Time   NA 140 08/27/2018 0913   NA 143  11/23/2016 1504   K 3.7 08/27/2018 0913   CL 101 08/27/2018 0913   CO2 26 08/27/2018 0913   BUN 15 08/27/2018 0913   BUN 14 11/23/2016 1504   CREATININE 0.97 08/27/2018 0913      Component Value Date/Time   CALCIUM 9.4 08/27/2018 0913   ALKPHOS 78 08/27/2018 0913   AST 14 (L) 08/27/2018 0913   ALT 11 08/27/2018 0913   BILITOT 0.2 (L) 08/27/2018 0913       RADIOGRAPHIC STUDIES:  No results found.   ASSESSMENT/PLAN:  This is a very pleasant 63 year old Caucasian female with stage IIIb non-small cell lung cancer consistent with squamous cell carcinoma.  She previously was treated with a course of concurrent chemoradiation with weekly carboplatin and paclitaxel.  She status post 7 cycles.  The patient responded well to therapy without any concerning adverse effects except for mild odynophagia.  She showed significant improvement in her disease with almost complete resolution of the left lower lobe lung mass as well as mediastinal lymphadenopathy. She is currently undergoing consolidation immunotherapy with Imfinzi 10 mg/kg every 2 weeks status post 2 cycles.  She is tolerating treatment well with no concerning complaints.  She was seen with Dr. Earlie Server today.  Labs were reviewed with the patient.  I recommend she proceed with cycle #3 of Imfinzi today as scheduled. I will see her back for follow-up and evaluation in 2 weeks prior to starting cycle #4.  The patient was advised to call immediately if she has any concerning symptoms in the interval. The patient voices understanding of current disease status and treatment options and is in agreement with the current care plan. All questions were answered. The patient knows to call the clinic with any problems, questions or concerns. We can certainly see the patient much sooner if necessary   No orders of the defined types were placed in this encounter.    Cassandra L Heilingoetter, PA-C 08/27/18   ADDENDUM: Hematology/Oncology  Attending: I had a face-to-face encounter with the patient today.  I recommended her care plan.  This is a very pleasant 63 years old white female diagnosed with stage IIIb non-small cell lung cancer post a course of concurrent chemoradiation with partial response. The patient is currently undergoing treatment with consolidation immunotherapy with Imfinzi status post 2 cycles.  She has been tolerating this treatment well with no concerning adverse effects.  The patient continues to have the baseline shortness of breath secondary to COPD and she is currently on home oxygen. I recommended for the patient to proceed with cycle #3 today as scheduled. I will see her back for follow-up visit in 2 weeks for evaluation before the next  cycle of her treatment. The patient was advised to call immediately if she has any concerning symptoms in the interval.  Disclaimer: This note was dictated with voice recognition software. Similar sounding words can inadvertently be transcribed and may be missed upon review. Eilleen Kempf, MD 08/27/18

## 2018-08-28 ENCOUNTER — Ambulatory Visit: Payer: Self-pay

## 2018-09-10 ENCOUNTER — Inpatient Hospital Stay: Payer: BLUE CROSS/BLUE SHIELD

## 2018-09-10 ENCOUNTER — Inpatient Hospital Stay (HOSPITAL_BASED_OUTPATIENT_CLINIC_OR_DEPARTMENT_OTHER): Payer: BLUE CROSS/BLUE SHIELD | Admitting: Medical

## 2018-09-10 ENCOUNTER — Encounter: Payer: Self-pay | Admitting: Medical

## 2018-09-10 VITALS — BP 127/65 | HR 104 | Temp 98.3°F | Resp 18 | Ht 63.0 in | Wt 232.5 lb

## 2018-09-10 DIAGNOSIS — I10 Essential (primary) hypertension: Secondary | ICD-10-CM | POA: Diagnosis not present

## 2018-09-10 DIAGNOSIS — Z9981 Dependence on supplemental oxygen: Secondary | ICD-10-CM

## 2018-09-10 DIAGNOSIS — J449 Chronic obstructive pulmonary disease, unspecified: Secondary | ICD-10-CM | POA: Diagnosis not present

## 2018-09-10 DIAGNOSIS — C3432 Malignant neoplasm of lower lobe, left bronchus or lung: Secondary | ICD-10-CM

## 2018-09-10 DIAGNOSIS — Z7982 Long term (current) use of aspirin: Secondary | ICD-10-CM

## 2018-09-10 DIAGNOSIS — Z79899 Other long term (current) drug therapy: Secondary | ICD-10-CM | POA: Diagnosis not present

## 2018-09-10 DIAGNOSIS — Z5112 Encounter for antineoplastic immunotherapy: Secondary | ICD-10-CM | POA: Diagnosis not present

## 2018-09-10 DIAGNOSIS — Z87891 Personal history of nicotine dependence: Secondary | ICD-10-CM | POA: Diagnosis not present

## 2018-09-10 LAB — CBC WITH DIFFERENTIAL (CANCER CENTER ONLY)
Abs Immature Granulocytes: 0.03 10*3/uL (ref 0.00–0.07)
Basophils Absolute: 0 10*3/uL (ref 0.0–0.1)
Basophils Relative: 1 %
Eosinophils Absolute: 0.2 10*3/uL (ref 0.0–0.5)
Eosinophils Relative: 3 %
HCT: 40.7 % (ref 36.0–46.0)
Hemoglobin: 12.6 g/dL (ref 12.0–15.0)
Immature Granulocytes: 0 %
Lymphocytes Relative: 14 %
Lymphs Abs: 1 10*3/uL (ref 0.7–4.0)
MCH: 30.2 pg (ref 26.0–34.0)
MCHC: 31 g/dL (ref 30.0–36.0)
MCV: 97.6 fL (ref 80.0–100.0)
Monocytes Absolute: 0.7 10*3/uL (ref 0.1–1.0)
Monocytes Relative: 9 %
Neutro Abs: 5.6 10*3/uL (ref 1.7–7.7)
Neutrophils Relative %: 73 %
Platelet Count: 244 10*3/uL (ref 150–400)
RBC: 4.17 MIL/uL (ref 3.87–5.11)
RDW: 12.8 % (ref 11.5–15.5)
WBC Count: 7.5 10*3/uL (ref 4.0–10.5)
nRBC: 0 % (ref 0.0–0.2)

## 2018-09-10 LAB — CMP (CANCER CENTER ONLY)
ALT: 8 U/L (ref 0–44)
AST: 11 U/L — ABNORMAL LOW (ref 15–41)
Albumin: 3.3 g/dL — ABNORMAL LOW (ref 3.5–5.0)
Alkaline Phosphatase: 77 U/L (ref 38–126)
Anion gap: 13 (ref 5–15)
BUN: 14 mg/dL (ref 6–20)
CO2: 27 mmol/L (ref 22–32)
Calcium: 9.4 mg/dL (ref 8.9–10.3)
Chloride: 99 mmol/L (ref 98–111)
Creatinine: 0.85 mg/dL (ref 0.44–1.00)
GFR, Est AFR Am: >60 mL/min (ref >60)
GFR, Estimated: >60 mL/min (ref >60)
Glucose, Bld: 99 mg/dL (ref 70–99)
Potassium: 3.9 mmol/L (ref 3.5–5.1)
Sodium: 139 mmol/L (ref 135–145)
Total Bilirubin: 0.3 mg/dL (ref 0.3–1.2)
Total Protein: 7.8 g/dL (ref 6.5–8.1)

## 2018-09-10 LAB — TSH: TSH: 3.285 u[IU]/mL (ref 0.308–3.960)

## 2018-09-10 MED ORDER — SODIUM CHLORIDE 0.9 % IV SOLN
9.8000 mg/kg | Freq: Once | INTRAVENOUS | Status: AC
  Administered 2018-09-10: 1000 mg via INTRAVENOUS
  Filled 2018-09-10: qty 20

## 2018-09-10 MED ORDER — SODIUM CHLORIDE 0.9 % IV SOLN
Freq: Once | INTRAVENOUS | Status: AC
  Administered 2018-09-10: 10:00:00 via INTRAVENOUS
  Filled 2018-09-10: qty 250

## 2018-09-10 NOTE — Patient Instructions (Signed)
[facility] Discharge Instructions for Patients Receiving Chemotherapy  Today you received the following chemotherapy agents: Imfinzi.  To help prevent nausea and vomiting after your treatment, we encourage you to take your nausea medication as directed.   If you develop nausea and vomiting that is not controlled by your nausea medication, call the clinic.   BELOW ARE SYMPTOMS THAT SHOULD BE REPORTED IMMEDIATELY:  *FEVER GREATER THAN 100.5 F  *CHILLS WITH OR WITHOUT FEVER  NAUSEA AND VOMITING THAT IS NOT CONTROLLED WITH YOUR NAUSEA MEDICATION  *UNUSUAL SHORTNESS OF BREATH  *UNUSUAL BRUISING OR BLEEDING  TENDERNESS IN MOUTH AND THROAT WITH OR WITHOUT PRESENCE OF ULCERS  *URINARY PROBLEMS  *BOWEL PROBLEMS  UNUSUAL RASH Items with * indicate a potential emergency and should be followed up as soon as possible.  Feel free to call the clinic should you have any questions or concerns. The clinic phone number is [phone].  Please show the CHEMO ALERT CARD at check-in to the Emergency Department and triage nurse.   

## 2018-09-11 ENCOUNTER — Other Ambulatory Visit: Payer: Self-pay

## 2018-09-11 ENCOUNTER — Ambulatory Visit
Admission: RE | Admit: 2018-09-11 | Discharge: 2018-09-11 | Disposition: A | Discharge status: Home / Self Care | Payer: BLUE CROSS/BLUE SHIELD | Source: Ambulatory Visit | Attending: Radiation Oncology | Admitting: Radiation Oncology

## 2018-09-11 ENCOUNTER — Encounter: Payer: Self-pay | Admitting: Radiation Oncology

## 2018-09-11 VITALS — BP 97/74 | HR 109 | Temp 98.3°F | Resp 24 | Ht 62.0 in | Wt 233.2 lb

## 2018-09-11 DIAGNOSIS — Z923 Personal history of irradiation: Secondary | ICD-10-CM | POA: Diagnosis not present

## 2018-09-11 DIAGNOSIS — C3490 Malignant neoplasm of unspecified part of unspecified bronchus or lung: Secondary | ICD-10-CM

## 2018-09-11 DIAGNOSIS — Z79899 Other long term (current) drug therapy: Secondary | ICD-10-CM | POA: Insufficient documentation

## 2018-09-11 DIAGNOSIS — C3432 Malignant neoplasm of lower lobe, left bronchus or lung: Secondary | ICD-10-CM | POA: Diagnosis not present

## 2018-09-11 DIAGNOSIS — Z7982 Long term (current) use of aspirin: Secondary | ICD-10-CM | POA: Diagnosis not present

## 2018-09-11 NOTE — Progress Notes (Signed)
Radiation Oncology         [phone] ________________________________  Name: Deborah [name] MRN: [id number]  Date of Service: 09/11/2018 DOB: March 28, 1956  Post Treatment Note  CC: [name], MD  Rigoberto Noel, MD  Diagnosis: Stage IIIB, cT3N2M0 NSCLC, squamous cell carcinoma of the LLL.  Interval Since Last Radiation:  11 weeks   05/13/2018 - 06/27/2018: The patient was treated to the disease within the LLL lung initially to a dose of 60 Gy in 30 fractions using a 5 field, 3-D conformal technique. The patient then received a cone down boost treatment for an additional 6 Gy delivered in 3 fractions. This yielded a final total dose of 66 Gy.   Narrative:  The patient returns today for routine follow-up.  She tolerated radiotherapy well but did have some anticipated side effects of esophagitis. She has noted resolution of her symptoms and had a CT that showed more cystic appearing change of her primary tumor and she is currently on consolidation immunotherapy.                              On review of systems, the patient states she's doing well without progressive symptoms of SOB. She denies fevers or chills. She has had cough of clear to white mucous without hemoptysis. Her esophagitis has resolved per report. No other complaints are noted.  ALLERGIES:  has No Known Allergies.  Meds: Current Outpatient Medications  Medication Sig Dispense Refill  . acetaminophen (TYLENOL) 500 MG tablet Take 1,500 mg by mouth 2 (two) times daily as needed for moderate pain or headache.    . albuterol (PROVENTIL) (2.5 MG/3ML) 0.083% nebulizer solution USE 1 VIAL VIA NEBULIZER EVERY 3 HOURS AS NEEDED FOR WHEEZING OR SHORTNESS OF BREATH (Patient taking differently: Take 2.5 mg by nebulization every 3 (three) hours as needed for wheezing or shortness of breath. ) 1125 mL 1  . aspirin 81 MG chewable tablet Chew 1 tablet (81 mg total) by mouth daily. 30 tablet 1  . Cholecalciferol (D3 ADULT PO) Take  1,000 mg by mouth.    . diltiazem (CARDIZEM CD) 120 MG 24 hr capsule TAKE 1 CAPSULE BY MOUTH EVERY DAY 90 capsule 1  . furosemide (LASIX) 20 MG tablet TAKE 2 TABLETS EACH MORNING AND 1 AT 3 PM 270 tablet 0  . ipratropium-albuterol (DUONEB) 0.5-2.5 (3) MG/3ML SOLN USE 1 VIAL VIA NEBULIZER EVERY 6 HOURS AS NEEDED (Patient taking differently: Inhale 3 mLs into the lungs every 6 (six) hours as needed (shortness of breath or wheezing). ) 360 mL 2  . montelukast (SINGULAIR) 10 MG tablet TAKE 1 TABLET(10 MG) BY MOUTH AT BEDTIME 90 tablet 1  . OXYGEN Inhale 4 L/min into the lungs continuous.     . potassium chloride SA (KLOR-CON M20) 20 MEQ tablet TAKE 1/2 TABLET(10 MEQ) BY MOUTH DAILY 45 tablet 1  . Spacer/Aero-Holding Chambers (AEROCHAMBER MV) inhaler Use as instructed 1 each 0  . VENTOLIN HFA 108 (90 Base) MCG/ACT inhaler INHALE 2 PUFFS BY MOUTH EVERY 6 HOURS AS NEEDED FOR WHEEZING OR SHORTNESS OF BREATH 18 g 5  . vitamin B-12 (CYANOCOBALAMIN) 500 MCG tablet Take 500 mcg by mouth daily.    . prochlorperazine (COMPAZINE) 10 MG tablet TAKE 1 TABLET BY MOUTH EVERY 6 HOURS AS NEEDED FOR NAUSEA OR VOMITING. (Patient not taking: Reported on 09/11/2018) 30 tablet 0   No current facility-administered medications for this encounter.  Physical Findings:  height is 5\' 2"  (1.575 m) and weight is 233 lb 3.2 oz (105.8 kg). Her oral temperature is 98.3 F (36.8 C). Her blood pressure is 97/74 and her pulse is 109 (abnormal). Her respiration is 24 (abnormal) and oxygen saturation is 95%.  Pain Assessment Pain Score: 7  Pain Loc: Breast/10 The patient is on 4L [employer] prior to taking her O2 sat. In general this is a well appearing caucasian female in no acute distress. She's alert and oriented x4 and appropriate throughout the examination. Cardiopulmonary assessment is negative for acute distress and she exhibits normal effort. Chest is clear to ausculation bilaterally.  Lab Findings: Lab Results  Component Value  Date   WBC 7.5 09/10/2018   HGB 12.6 09/10/2018   HCT 40.7 09/10/2018   MCV 97.6 09/10/2018   PLT 244 09/10/2018     Radiographic Findings: No results found.  Impression/Plan: 1. Stage IIIB, cT3N2M0 NSCLC, squamous cell carcinoma of the LLL. The patient is doing well and is tolerating her immunotherapy regimen. Her radiation related symptoms have also improved. She will continue under the care of Dr. Julien Nordmann and will be seen by [place] as needed. I gave her precautions to call if she develops symptoms of pneumonitis, however she appears to be doing clinically quite well at this time.     Carola Rhine, PAC

## 2018-09-12 NOTE — Progress Notes (Signed)
[facility] Progress Note   Deborah [name] [id number] 1956-04-20 63 y.o.  Deborah [name] is managed by Dr. Fanny Bien. Deborah [name]  Actively treated with chemotherapy/immunotherapy/hormonal therapy: yes  Current therapy: Consolidation treatment with immunotherapy with Imfinzi 10 mg/KG every 2 weeks  Last treated: 08/27/2018 (cycle 3)  Next scheduled appointment with provider: 09/24/2018  Assessment: Plan:    Primary malignant neoplasm of bronchus of left lower lobe (HCC)   Stage IIIB (T3, N2, M0) non-small cell lung cancer, squamous cell carcinoma: Deborah [name] presents to the clinic today for cycle 4 of Imfinzi.  We will proceed with her treatment with the patient to return for follow-up on 09/24/2018.  Please see After Visit Summary for patient specific instructions.  Future Appointments  Date Time Provider [place]  09/24/2018  8:30 AM [facility] LAB 5 [facility] None  09/24/2018  9:00 AM Tanner, Lucianne Lei E., PA-C [facility] None  09/24/2018  9:30 AM [facility] INFUSION [facility] None  10/08/2018  9:45 AM [facility] LAB 5 [facility] None  10/08/2018 10:15 AM Curt Bears, MD [facility] None  10/08/2018 11:30 AM [facility] INFUSION [facility] None  10/22/2018  8:00 AM [facility] LAB 1 [facility] None  10/22/2018  8:30 AM Curt Bears, MD [facility] None  10/22/2018  9:15 AM [facility] INFUSION [facility] None  11/05/2018  8:00 AM [facility] LAB 5 [facility] None  11/05/2018  8:30 AM Curt Bears, MD [facility] None  11/05/2018  9:30 AM [facility] INFUSION [facility] None  11/19/2018  8:00 AM [facility] LAB 5 [facility] None  11/19/2018  8:30 AM Curt Bears, MD [facility] None  11/19/2018  9:30 AM [facility] INFUSION [facility] None  12/03/2018  8:30 AM [facility] LAB 4 [facility] None  12/03/2018  9:00 AM Curt Bears, MD [facility] None  12/03/2018 10:00 AM [facility] INFUSION [facility] None    No orders of the defined  types were placed in this encounter.      Subjective:   Patient ID:  Deborah [name] is a 63 y.o. (DOB 07-05-1956) female.  Chief Complaint: No chief complaint on file.   Deborah [name] is a 63 year old female with a history of a stage IIIB (T3, N2, M0) non-small cell lung cancer, squamous cell carcinoma.  She is managed by Dr. Julien Nordmann and presents to the clinic today for cycle 4 of Imfinzi.  She reports that she is doing well with no acute issues of concern today.  She denies fevers, chills, sweats, nausea, vomiting, constipation, or diarrhea.  Medications: I have reviewed the patient's current medications.  Allergies: No Known Allergies  Past Medical History:  Diagnosis Date  . Anemia    as teen  . Asthma   . CHF (congestive heart failure) ([place])   . COPD, mild ([place])   . Depression   . Diverticulitis   . Family history of anesthesia complication    vomiting  . GI bleeding   . Heart failure ([place])    New onset 07/25/14  . Histoplasmosis    left eye  . Hyperkalemia   . Hypertension   . Obesity (BMI 30-39.9)   . Pneumonia    dx wtih pneumonia on 05/27/16- seen by [facility]   . PONV (postoperative nausea and vomiting)   . Restrictive lung disease   . Shortness of breath    with exertion   . Sleep apnea    mask and oxygen at nite for sleep at 2L   . Tobacco abuse   . Umbilical hernia     Past  Surgical History:  Procedure Laterality Date  . APPLICATION OF WOUND VAC  03/19/2013   Procedure: APPLICATION OF WOUND VAC;  Surgeon: Madilyn Hook, DO;  Location: [facility];  Service: General;;  . CESAREAN SECTION  04/12/85  . COLONOSCOPY WITH PROPOFOL N/A 06/13/2016   Procedure: COLONOSCOPY WITH PROPOFOL;  Surgeon: Jerene Bears, MD;  Location: [facility];  Service: Gastroenterology;  Laterality: N/A;  . ERCP N/A 02/15/2017   Procedure: ENDOSCOPIC RETROGRADE CHOLANGIOPANCREATOGRAPHY (ERCP);  Surgeon: [name], MD;  Location: [facility];  Service: Endoscopy;   Laterality: N/A;  . INSERTION OF MESH N/A 03/19/2013   Procedure: INSERTION OF MESH;  Surgeon: Madilyn Hook, DO;  Location: [facility];  Service: General;  Laterality: N/A;  . VENTRAL HERNIA REPAIR N/A 03/19/2013   Procedure:  OPEN VENTRAL HERNIA REPAIR WITH MESH AND APPLICATION OF WOUND VAC;  Surgeon: Madilyn Hook, DO;  Location: [facility];  Service: General;  Laterality: N/A;  . VIDEO BRONCHOSCOPY Bilateral 04/19/2018   Procedure: VIDEO BRONCHOSCOPY WITH FLUORO;  Surgeon: Rigoberto Noel, MD;  [facility];  Service: Cardiopulmonary;  Laterality: Bilateral;    Family History  Problem Relation Age of Onset  . Heart attack Mother   . Emphysema Mother        was a smoker  . Lymphoma Maternal Uncle   . Pancreatic cancer Maternal Aunt   . Emphysema Other        Mother  . Heart failure Other        Father  . Cancer Other        Unknown  . Colon cancer Neg Hx   . Breast cancer Neg Hx     Social History   Socioeconomic History  . Marital status: Married    Spouse name: Not on file  . Number of children: 1  . Years of education: Not on file  . Highest education level: Not on file  Occupational History  . Occupation: Retired    Fish farm manager: NOT EMPLOYED  Social Needs  . Financial resource strain: Not on file  . Food insecurity:    Worry: Not on file    Inability: Not on file  . Transportation needs:    Medical: Yes    Non-medical: No  Tobacco Use  . Smoking status: Former Smoker    Packs/day: 3.00    Years: 43.00    Pack years: 129.00    Types: Cigarettes    Last attempt to quit: 12/17/2012    Years since quitting: 5.7  . Smokeless tobacco: Never Used  Substance and Sexual Activity  . Alcohol use: No    Alcohol/week: 0.0 standard drinks  . Drug use: No  . Sexual activity: Not on file  Lifestyle  . Physical activity:    Days per week: Not on file    Minutes per session: Not on file  . Stress: Not on file  Relationships  . Social connections:    Talks on phone: Not on  file    Gets together: Not on file    Attends religious service: Not on file    Active member of club or organization: Not on file    Attends meetings of clubs or organizations: Not on file    Relationship status: Not on file  . Intimate partner violence:    Fear of current or ex partner: Not on file    Emotionally abused: Not on file    Physically abused: Not on file    Forced sexual activity:  Not on file  Other Topics Concern  . Not on file  Social History Narrative   ** Merged History Encounter **       ** Data from: 07/25/14 Enc Dept: [facility]       ** Data from: 06/05/13 Enc Dept: [facility]   Married, one son 38 yo.             Past Medical History, Surgical history, Social history, and Family history were reviewed and updated as appropriate.   Please see review of systems for further details on the patient's review from today.   Review of Systems:  Review of Systems  Constitutional: Negative for chills, diaphoresis and fever.  HENT: Negative for trouble swallowing and voice change.   Respiratory: Negative for cough, chest tightness, shortness of breath and wheezing.   Cardiovascular: Negative for chest pain and palpitations.  Gastrointestinal: Negative for abdominal pain, constipation, diarrhea, nausea and vomiting.  Musculoskeletal: Negative for back pain and myalgias.  Neurological: Negative for dizziness, light-headedness and headaches.    Objective:   Physical Exam:  BP 127/65 (BP Location: Left Arm, Patient Position: Sitting)   Pulse (!) 104   Temp 98.3 F (36.8 C) (Oral)   Resp 18   Ht 5\' 3"  (1.6 m)   Wt 232 lb 8 oz (105.5 kg)   SpO2 96%   BMI 41.19 kg/m  ECOG: 1  Physical Exam Constitutional:      General: She is not in acute distress.    Appearance: She is not diaphoretic.  HENT:     Head: Normocephalic and atraumatic.  Cardiovascular:     Rate and Rhythm: Normal rate and regular rhythm.     Heart sounds: Normal heart  sounds. No murmur. No friction rub. No gallop.   Pulmonary:     Effort: Pulmonary effort is normal. No respiratory distress.     Breath sounds: Normal breath sounds. No wheezing or rales.  Abdominal:     General: Bowel sounds are normal. There is no distension.     Palpations: Abdomen is soft.     Tenderness: There is no abdominal tenderness. There is no guarding.  Skin:    General: Skin is warm and dry.     Findings: No erythema or rash.  Neurological:     Mental Status: She is alert.     Lab Review:     Component Value Date/Time   NA 139 09/10/2018 0814   NA 143 11/23/2016 1504   K 3.9 09/10/2018 0814   CL 99 09/10/2018 0814   CO2 27 09/10/2018 0814   GLUCOSE 99 09/10/2018 0814   BUN 14 09/10/2018 0814   BUN 14 11/23/2016 1504   CREATININE 0.85 09/10/2018 0814   CALCIUM 9.4 09/10/2018 0814   PROT 7.8 09/10/2018 0814   ALBUMIN 3.3 (L) 09/10/2018 0814   AST 11 (L) 09/10/2018 0814   ALT 8 09/10/2018 0814   ALKPHOS 77 09/10/2018 0814   BILITOT 0.3 09/10/2018 0814   GFRNONAA >60 09/10/2018 0814   GFRAA >60 09/10/2018 0814       Component Value Date/Time   WBC 7.5 09/10/2018 0814   WBC 11.5 (H) 01/30/2017 1401   RBC 4.17 09/10/2018 0814   HGB 12.6 09/10/2018 0814   HCT 40.7 09/10/2018 0814   PLT 244 09/10/2018 0814   MCV 97.6 09/10/2018 0814   MCH 30.2 09/10/2018 0814   MCHC 31.0 09/10/2018 0814   RDW 12.8 09/10/2018 0814   LYMPHSABS 1.0 09/10/2018 0623  MONOABS 0.7 09/10/2018 0814   EOSABS 0.2 09/10/2018 0814   BASOSABS 0.0 09/10/2018 0814   -------------------------------  Imaging from last 24 hours (if applicable):  Radiology interpretation: No results found.      This case was discussed with Dr. Julien Nordmann. He expressed agreement with my management of this patient.

## 2018-09-20 ENCOUNTER — Telehealth: Payer: Self-pay | Admitting: Internal Medicine

## 2018-09-20 NOTE — Telephone Encounter (Signed)
MM PAL 3/24 moved appointments to 3/27. Confirmed with patient. Other appointments remain the same. Patient will get updated schedule at next visit.

## 2018-09-24 ENCOUNTER — Encounter: Payer: Self-pay | Admitting: Physician Assistant

## 2018-09-24 ENCOUNTER — Inpatient Hospital Stay: Payer: BLUE CROSS/BLUE SHIELD

## 2018-09-24 ENCOUNTER — Other Ambulatory Visit: Payer: Self-pay

## 2018-09-24 ENCOUNTER — Inpatient Hospital Stay (HOSPITAL_BASED_OUTPATIENT_CLINIC_OR_DEPARTMENT_OTHER): Payer: BLUE CROSS/BLUE SHIELD | Admitting: Physician Assistant

## 2018-09-24 ENCOUNTER — Inpatient Hospital Stay: Payer: BLUE CROSS/BLUE SHIELD | Attending: Internal Medicine

## 2018-09-24 VITALS — BP 123/90 | HR 107 | Temp 98.1°F | Resp 17 | Ht 62.0 in | Wt 231.8 lb

## 2018-09-24 DIAGNOSIS — C3432 Malignant neoplasm of lower lobe, left bronchus or lung: Secondary | ICD-10-CM

## 2018-09-24 DIAGNOSIS — J449 Chronic obstructive pulmonary disease, unspecified: Secondary | ICD-10-CM

## 2018-09-24 DIAGNOSIS — Z79899 Other long term (current) drug therapy: Secondary | ICD-10-CM

## 2018-09-24 DIAGNOSIS — Z87891 Personal history of nicotine dependence: Secondary | ICD-10-CM

## 2018-09-24 DIAGNOSIS — Z9981 Dependence on supplemental oxygen: Secondary | ICD-10-CM

## 2018-09-24 DIAGNOSIS — R51 Headache: Secondary | ICD-10-CM | POA: Diagnosis not present

## 2018-09-24 DIAGNOSIS — Z5112 Encounter for antineoplastic immunotherapy: Secondary | ICD-10-CM | POA: Diagnosis not present

## 2018-09-24 LAB — CMP (CANCER CENTER ONLY)
ALT: 9 U/L (ref 0–44)
AST: 12 U/L — ABNORMAL LOW (ref 15–41)
Albumin: 3.1 g/dL — ABNORMAL LOW (ref 3.5–5.0)
Alkaline Phosphatase: 80 U/L (ref 38–126)
Anion gap: 14 (ref 5–15)
BUN: 12 mg/dL (ref 6–20)
CO2: 26 mmol/L (ref 22–32)
Calcium: 9.2 mg/dL (ref 8.9–10.3)
Chloride: 101 mmol/L (ref 98–111)
Creatinine: 0.9 mg/dL (ref 0.44–1.00)
GFR, Est AFR Am: >60 mL/min (ref >60)
GFR, Estimated: >60 mL/min (ref >60)
Glucose, Bld: 103 mg/dL — ABNORMAL HIGH (ref 70–99)
Potassium: 3.9 mmol/L (ref 3.5–5.1)
Sodium: 141 mmol/L (ref 135–145)
Total Bilirubin: 0.2 mg/dL — ABNORMAL LOW (ref 0.3–1.2)
Total Protein: 7.9 g/dL (ref 6.5–8.1)

## 2018-09-24 LAB — CBC WITH DIFFERENTIAL (CANCER CENTER ONLY)
Abs Immature Granulocytes: 0.02 10*3/uL (ref 0.00–0.07)
Basophils Absolute: 0 10*3/uL (ref 0.0–0.1)
Basophils Relative: 0 %
Eosinophils Absolute: 0.2 10*3/uL (ref 0.0–0.5)
Eosinophils Relative: 3 %
HCT: 40.4 % (ref 36.0–46.0)
Hemoglobin: 12.6 g/dL (ref 12.0–15.0)
Immature Granulocytes: 0 %
Lymphocytes Relative: 11 %
Lymphs Abs: 0.8 10*3/uL (ref 0.7–4.0)
MCH: 29.7 pg (ref 26.0–34.0)
MCHC: 31.2 g/dL (ref 30.0–36.0)
MCV: 95.3 fL (ref 80.0–100.0)
Monocytes Absolute: 0.5 10*3/uL (ref 0.1–1.0)
Monocytes Relative: 7 %
Neutro Abs: 5.9 10*3/uL (ref 1.7–7.7)
Neutrophils Relative %: 79 %
Platelet Count: 269 10*3/uL (ref 150–400)
RBC: 4.24 MIL/uL (ref 3.87–5.11)
RDW: 12.8 % (ref 11.5–15.5)
WBC Count: 7.4 10*3/uL (ref 4.0–10.5)
nRBC: 0 % (ref 0.0–0.2)

## 2018-09-24 LAB — TSH: TSH: 2.376 u[IU]/mL (ref 0.308–3.960)

## 2018-09-24 MED ORDER — SODIUM CHLORIDE 0.9 % IV SOLN
9.8000 mg/kg | Freq: Once | INTRAVENOUS | Status: AC
  Administered 2018-09-24: 1000 mg via INTRAVENOUS
  Filled 2018-09-24: qty 20

## 2018-09-24 MED ORDER — SODIUM CHLORIDE 0.9 % IV SOLN
Freq: Once | INTRAVENOUS | Status: AC
  Administered 2018-09-24: 11:00:00 via INTRAVENOUS
  Filled 2018-09-24: qty 250

## 2018-09-24 NOTE — Patient Instructions (Signed)
[facility] Discharge Instructions for Patients Receiving Chemotherapy  Today you received the following chemotherapy agents: Durvalumab (Imfinzi)   To help prevent nausea and vomiting after your treatment, we encourage you to take your nausea medication as directed.   If you develop nausea and vomiting that is not controlled by your nausea medication, call the clinic.   BELOW ARE SYMPTOMS THAT SHOULD BE REPORTED IMMEDIATELY:  *FEVER GREATER THAN 100.5 F  *CHILLS WITH OR WITHOUT FEVER  NAUSEA AND VOMITING THAT IS NOT CONTROLLED WITH YOUR NAUSEA MEDICATION  *UNUSUAL SHORTNESS OF BREATH  *UNUSUAL BRUISING OR BLEEDING  TENDERNESS IN MOUTH AND THROAT WITH OR WITHOUT PRESENCE OF ULCERS  *URINARY PROBLEMS  *BOWEL PROBLEMS  UNUSUAL RASH Items with * indicate a potential emergency and should be followed up as soon as possible.  Feel free to call the clinic should you have any questions or concerns. The clinic phone number is [phone].  Please show the CHEMO ALERT CARD at check-in to the Emergency Department and triage nurse.   

## 2018-09-24 NOTE — Progress Notes (Signed)
[facility] OFFICE PROGRESS NOTE  [name], MD [place] [id number]  DIAGNOSIS: stage IIIB (T3, N2, M0) non-small cell lung cancer, squamous cell carcinoma presented with large left lower lobe lung mass in addition to mediastinal lymphadenopathy diagnosed in October 2019. PDL 1 expression is negative  PRIOR THERAPY: Concurrent chemoradiation with weekly carboplatin for AUC of 2 and paclitaxel 45 mg/M2. Status post 7cycles with partial response.  CURRENT THERAPY: Consolidation treatment with immunotherapy with Imfinzi 10 mg/KG every 2 weeks. First dose July 31, 2018.Status post 4 cycle.   INTERVAL HISTORY: Deborah [name] 63 y.o. female returns to clinic for a follow-up visit accompanied by her husband.  The patient is feeling well today without any concerning complaints except for a new onset headache. She denies a history of migraines or headaches. She states that for the last 4-5 days, she has a headache in the morning that lasts approximately 6-8 hours. She localizes her headache to the temples bilaterally. She rates her pain a 2/10 and characterizes it as a mild discomfort. She takes tylenol with relieve of her symptoms. She reports associated blurry vision; however, she has a history of an eye stroma and affiliates her visual concerns to that. She denies any associated symptoms including gait changes, seizures, or dizziness. She states she believes her headache may be related to allergies.  Otherwise, she is tolerating treatment with Imfinzi fairly well. She denies any fevers, chills, night sweats, or weight loss.  Continues to have baseline shortness of breath and cough secondary to COPD.  She is on 2 L of home oxygen at rest and 4L with exertion. She denies any chest pain or hemoptysis.  She denies any nausea, vomiting, diarrhea, or constipation. She denies any rashes or skin changes. She is here today for evaluation prior to starting cycle  #5.Marland Kitchen  MEDICAL HISTORY: Past Medical History:  Diagnosis Date  . Anemia    as teen  . Asthma   . CHF (congestive heart failure) ([place])   . COPD, mild ([place])   . Depression   . Diverticulitis   . Family history of anesthesia complication    vomiting  . GI bleeding   . Heart failure ([place])    New onset 07/25/14  . Histoplasmosis    left eye  . Hyperkalemia   . Hypertension   . Obesity (BMI 30-39.9)   . Pneumonia    dx wtih pneumonia on 05/27/16- seen by [facility]   . PONV (postoperative nausea and vomiting)   . Restrictive lung disease   . Shortness of breath    with exertion   . Sleep apnea    mask and oxygen at nite for sleep at 2L   . Tobacco abuse   . Umbilical hernia     ALLERGIES:  has No Known Allergies.  MEDICATIONS:  Current Outpatient Medications  Medication Sig Dispense Refill  . acetaminophen (TYLENOL) 500 MG tablet Take 1,500 mg by mouth 2 (two) times daily as needed for moderate pain or headache.    . albuterol (PROVENTIL) (2.5 MG/3ML) 0.083% nebulizer solution USE 1 VIAL VIA NEBULIZER EVERY 3 HOURS AS NEEDED FOR WHEEZING OR SHORTNESS OF BREATH (Patient taking differently: Take 2.5 mg by nebulization every 3 (three) hours as needed for wheezing or shortness of breath. ) 1125 mL 1  . aspirin 81 MG chewable tablet Chew 1 tablet (81 mg total) by mouth daily. 30 tablet 1  . Cholecalciferol (D3 ADULT PO) Take  1,000 mg by mouth.    . diltiazem (CARDIZEM CD) 120 MG 24 hr capsule TAKE 1 CAPSULE BY MOUTH EVERY DAY 90 capsule 1  . furosemide (LASIX) 20 MG tablet TAKE 2 TABLETS EACH MORNING AND 1 AT 3 PM 270 tablet 0  . ipratropium-albuterol (DUONEB) 0.5-2.5 (3) MG/3ML SOLN USE 1 VIAL VIA NEBULIZER EVERY 6 HOURS AS NEEDED (Patient taking differently: Inhale 3 mLs into the lungs every 6 (six) hours as needed (shortness of breath or wheezing). ) 360 mL 2  . montelukast (SINGULAIR) 10 MG tablet TAKE 1 TABLET(10 MG) BY MOUTH AT BEDTIME 90 tablet 1  . OXYGEN Inhale 4 L/min into  the lungs continuous.     . potassium chloride SA (KLOR-CON M20) 20 MEQ tablet TAKE 1/2 TABLET(10 MEQ) BY MOUTH DAILY 45 tablet 1  . prochlorperazine (COMPAZINE) 10 MG tablet TAKE 1 TABLET BY MOUTH EVERY 6 HOURS AS NEEDED FOR NAUSEA OR VOMITING. 30 tablet 0  . Spacer/Aero-Holding Chambers (AEROCHAMBER MV) inhaler Use as instructed 1 each 0  . VENTOLIN HFA 108 (90 Base) MCG/ACT inhaler INHALE 2 PUFFS BY MOUTH EVERY 6 HOURS AS NEEDED FOR WHEEZING OR SHORTNESS OF BREATH 18 g 5  . vitamin B-12 (CYANOCOBALAMIN) 500 MCG tablet Take 500 mcg by mouth daily.     No current facility-administered medications for this visit.    Facility-Administered Medications Ordered in Other Visits  Medication Dose Route Frequency Provider Last Rate Last Dose  . durvalumab (IMFINZI) 1,000 mg in sodium chloride 0.9 % 100 mL chemo infusion  9.8 mg/kg (Treatment Plan Recorded) Intravenous Once Curt Bears, MD        SURGICAL HISTORY:  Past Surgical History:  Procedure Laterality Date  . APPLICATION OF WOUND VAC  03/19/2013   Procedure: APPLICATION OF WOUND VAC;  Surgeon: Madilyn Hook, DO;  Location: [facility];  Service: General;;  . CESAREAN SECTION  04/12/85  . COLONOSCOPY WITH PROPOFOL N/A 06/13/2016   Procedure: COLONOSCOPY WITH PROPOFOL;  Surgeon: Jerene Bears, MD;  [facility];  Service: Gastroenterology;  Laterality: N/A;  . ERCP N/A 02/15/2017   Procedure: ENDOSCOPIC RETROGRADE CHOLANGIOPANCREATOGRAPHY (ERCP);  Surgeon: [name], MD;  Location: [facility];  Service: Endoscopy;  Laterality: N/A;  . INSERTION OF MESH N/A 03/19/2013   Procedure: INSERTION OF MESH;  Surgeon: Madilyn Hook, DO;  Location: [facility];  Service: General;  Laterality: N/A;  . VENTRAL HERNIA REPAIR N/A 03/19/2013   Procedure:  OPEN VENTRAL HERNIA REPAIR WITH MESH AND APPLICATION OF WOUND VAC;  Surgeon: Madilyn Hook, DO;  Location: [facility];  Service: General;  Laterality: N/A;  . VIDEO BRONCHOSCOPY Bilateral 04/19/2018    Procedure: VIDEO BRONCHOSCOPY WITH FLUORO;  Surgeon: Rigoberto Noel, MD;  [facility];  Service: Cardiopulmonary;  Laterality: Bilateral;    REVIEW OF SYSTEMS:   Review of Systems  Constitutional: Negative for appetite change, chills, fatigue, fever and unexpected weight change.  HENT:   Negative for mouth sores, nosebleeds, sore throat and trouble swallowing.   Eyes: Negative for eye problems and icterus.  Respiratory: Negative for cough, hemoptysis, shortness of breath and wheezing.   Cardiovascular: Negative for chest pain and leg swelling.  Gastrointestinal: Negative for abdominal pain, constipation, diarrhea, nausea and vomiting.  Genitourinary: Negative for bladder incontinence, difficulty urinating, dysuria, frequency and hematuria.   Musculoskeletal: Negative for back pain, gait problem, neck pain and neck stiffness.  Skin: Negative for itching and rash.  Neurological: Positive for mild headache to the temples. Negative for  dizziness, extremity weakness, gait problem, light-headedness and seizures.  Hematological: Negative for adenopathy. Does not bruise/bleed easily.  Psychiatric/Behavioral: Negative for confusion, depression and sleep disturbance. The patient is not nervous/anxious.     PHYSICAL EXAMINATION:  Blood pressure 123/90, pulse (!) 107, temperature 98.1 F (36.7 C), temperature source Oral, resp. rate 17, height 5\' 2"  (1.575 m), weight 231 lb 12.8 oz (105.1 kg), SpO2 91 %.  ECOG PERFORMANCE STATUS: 1 - Symptomatic but completely ambulatory  Physical Exam  Constitutional: Oriented to person, place, and time and well-developed, well-nourished, and in no distress. No distress.  HENT:  Head: Normocephalic and atraumatic.  Mouth/Throat: Oropharynx is clear and moist. No oropharyngeal exudate.  Eyes: Conjunctivae are normal. Right eye exhibits no discharge. Left eye exhibits no discharge. No scleral icterus.  Neck: Normal range of motion. Neck supple.   Cardiovascular: Normal rate, regular rhythm, normal heart sounds and intact distal pulses.   Pulmonary/Chest: Diffuse expiratory wheezing (improved from last visit). Effort normal and breath sounds normal. No respiratory distress. No rales.  Abdominal: Soft. Bowel sounds are normal. Exhibits no distension and no mass. There is no tenderness.  Musculoskeletal: Normal range of motion. Exhibits no edema.  Lymphadenopathy:    No cervical adenopathy.  Neurological: Alert and oriented to person, place, and time. Exhibits normal muscle tone. Gait normal. Coordination normal. Strength normal. CN II-VII grossly intact. Skin: Skin is warm and dry. No rash noted. Not diaphoretic. No erythema. No pallor.  Psychiatric: Mood, memory and judgment normal.  Vitals reviewed.  LABORATORY DATA: Lab Results  Component Value Date   WBC 7.4 09/24/2018   HGB 12.6 09/24/2018   HCT 40.4 09/24/2018   MCV 95.3 09/24/2018   PLT 269 09/24/2018      Chemistry      Component Value Date/Time   NA 141 09/24/2018 0830   NA 143 11/23/2016 1504   K 3.9 09/24/2018 0830   CL 101 09/24/2018 0830   CO2 26 09/24/2018 0830   BUN 12 09/24/2018 0830   BUN 14 11/23/2016 1504   CREATININE 0.90 09/24/2018 0830      Component Value Date/Time   CALCIUM 9.2 09/24/2018 0830   ALKPHOS 80 09/24/2018 0830   AST 12 (L) 09/24/2018 0830   ALT 9 09/24/2018 0830   BILITOT 0.2 (L) 09/24/2018 0830       RADIOGRAPHIC STUDIES:  No results found.   ASSESSMENT/PLAN:  This is a very pleasant 63 year old Caucasian female with stage IIIb non-small cell lung cancer, consistent with squamous cell carcinoma.  She presented with a large left lower lobe lung mass in addition to mediastinal lymphadenopathy.  She was diagnosed in October 2019.  PDL 1 expression is negative. She underwent a course of concurrent chemoradiation with weekly carboplatin and paclitaxel.  She is status post 7 cycles.  She tolerated treatment well without any  adverse effects except for mild odynophagia. She showed significant improvement of her disease with almost complete resolution of the left lower lung lobe lung mass as well as mediastinal lymphadenopathy.   She is currently undergoing consolidation immunotherapy with Imfinzi 10 mg/kg every 2 weeks.  She is status post 4 cycles. The patient was seen with Dr. Julien Nordmann today.  She continues to tolerate her treatment well without any concerning adverse effects. She continues to have baseline shortness of breath secondary to COPD for which she is on home oxygen.  Labs were reviewed with the patient.  Recommend she proceed with cycle #5 today as scheduled.  I will  see her back for a follow-up visit and evaluation in 2 weeks prior to starting cycle #6. For her headache, the patient states she believes her headache is related to allergies and it is relieved with tylenol. She was advised that if she develops new or worsening symptoms to please let us know. She was advised that if symptoms persist, we may consider an MRI in the future.  The patient was advised to call immediately if she has any concerning symptoms in the interval. The patient voices understanding of current disease status and treatment options and is in agreement with the current care plan. All questions were answered. The patient knows to call the clinic with any problems, questions or concerns. We can certainly see the patient much sooner if necessary  No orders of the defined types were placed in this encounter.    Kolyn Rozario L Andrej Spagnoli, PA-C 09/24/18  ADDENDUM: Hematology/Oncology Attending: I had a face-to-face encounter with the patient today.  I recommended her care plan.  This is a very pleasant 63 years old white female with a stage IIIb non-small cell lung cancer, squamous cell carcinoma status post concurrent chemoradiation with weekly carboplatin and paclitaxel with partial response.  The patient is currently undergoing  consolidation treatment with immunotherapy with Imfinzi status post 4 cycles.  She has been tolerating this treatment well with no concerning adverse effects. I recommended for the patient to proceed with cycle #5 today as scheduled. We will see her back for follow-up visit in 2 weeks for evaluation before starting cycle #6.  She will have repeat imaging studies after cycle #6 with repeat CT scan of the chest. The patient was advised to call immediately if she has any concerning symptoms in the interval.  Eilleen Kempf, MD 09/24/18

## 2018-10-08 ENCOUNTER — Ambulatory Visit: Payer: Self-pay | Admitting: Internal Medicine

## 2018-10-08 ENCOUNTER — Other Ambulatory Visit: Payer: Self-pay

## 2018-10-08 ENCOUNTER — Ambulatory Visit: Payer: Self-pay

## 2018-10-11 ENCOUNTER — Other Ambulatory Visit: Payer: Self-pay

## 2018-10-11 ENCOUNTER — Encounter: Payer: Self-pay | Admitting: Physician Assistant

## 2018-10-11 ENCOUNTER — Inpatient Hospital Stay: Payer: BLUE CROSS/BLUE SHIELD

## 2018-10-11 ENCOUNTER — Inpatient Hospital Stay (HOSPITAL_BASED_OUTPATIENT_CLINIC_OR_DEPARTMENT_OTHER): Payer: BLUE CROSS/BLUE SHIELD | Admitting: Physician Assistant

## 2018-10-11 VITALS — BP 130/88 | HR 130 | Temp 98.0°F | Resp 20 | Ht 62.0 in | Wt 233.0 lb

## 2018-10-11 VITALS — HR 93

## 2018-10-11 DIAGNOSIS — Z79899 Other long term (current) drug therapy: Secondary | ICD-10-CM | POA: Diagnosis not present

## 2018-10-11 DIAGNOSIS — C3432 Malignant neoplasm of lower lobe, left bronchus or lung: Secondary | ICD-10-CM

## 2018-10-11 DIAGNOSIS — J449 Chronic obstructive pulmonary disease, unspecified: Secondary | ICD-10-CM

## 2018-10-11 DIAGNOSIS — Z9981 Dependence on supplemental oxygen: Secondary | ICD-10-CM | POA: Diagnosis not present

## 2018-10-11 DIAGNOSIS — Z5112 Encounter for antineoplastic immunotherapy: Secondary | ICD-10-CM | POA: Diagnosis not present

## 2018-10-11 LAB — CBC WITH DIFFERENTIAL (CANCER CENTER ONLY)
Abs Immature Granulocytes: 0.04 10*3/uL (ref 0.00–0.07)
Basophils Absolute: 0.1 10*3/uL (ref 0.0–0.1)
Basophils Relative: 1 %
Eosinophils Absolute: 0.2 10*3/uL (ref 0.0–0.5)
Eosinophils Relative: 2 %
HCT: 41.6 % (ref 36.0–46.0)
Hemoglobin: 12.8 g/dL (ref 12.0–15.0)
Immature Granulocytes: 1 %
Lymphocytes Relative: 10 %
Lymphs Abs: 0.8 10*3/uL (ref 0.7–4.0)
MCH: 29.2 pg (ref 26.0–34.0)
MCHC: 30.8 g/dL (ref 30.0–36.0)
MCV: 95 fL (ref 80.0–100.0)
Monocytes Absolute: 0.6 10*3/uL (ref 0.1–1.0)
Monocytes Relative: 7 %
Neutro Abs: 6.3 10*3/uL (ref 1.7–7.7)
Neutrophils Relative %: 79 %
Platelet Count: 245 10*3/uL (ref 150–400)
RBC: 4.38 MIL/uL (ref 3.87–5.11)
RDW: 12.9 % (ref 11.5–15.5)
WBC Count: 8 10*3/uL (ref 4.0–10.5)
nRBC: 0 % (ref 0.0–0.2)

## 2018-10-11 LAB — CMP (CANCER CENTER ONLY)
ALT: 10 U/L (ref 0–44)
AST: 12 U/L — ABNORMAL LOW (ref 15–41)
Albumin: 3.2 g/dL — ABNORMAL LOW (ref 3.5–5.0)
Alkaline Phosphatase: 91 U/L (ref 38–126)
Anion gap: 12 (ref 5–15)
BUN: 17 mg/dL (ref 6–20)
CO2: 29 mmol/L (ref 22–32)
Calcium: 9.2 mg/dL (ref 8.9–10.3)
Chloride: 101 mmol/L (ref 98–111)
Creatinine: 0.91 mg/dL (ref 0.44–1.00)
GFR, Est AFR Am: >60 mL/min (ref >60)
GFR, Estimated: >60 mL/min (ref >60)
Glucose, Bld: 104 mg/dL — ABNORMAL HIGH (ref 70–99)
Potassium: 4.1 mmol/L (ref 3.5–5.1)
Sodium: 142 mmol/L (ref 135–145)
Total Bilirubin: 0.2 mg/dL — ABNORMAL LOW (ref 0.3–1.2)
Total Protein: 7.8 g/dL (ref 6.5–8.1)

## 2018-10-11 LAB — TSH: TSH: 3.414 u[IU]/mL (ref 0.308–3.960)

## 2018-10-11 MED ORDER — SODIUM CHLORIDE 0.9 % IV SOLN
Freq: Once | INTRAVENOUS | Status: AC
  Administered 2018-10-11: 10:00:00 via INTRAVENOUS
  Filled 2018-10-11: qty 250

## 2018-10-11 MED ORDER — SODIUM CHLORIDE 0.9 % IV SOLN
9.8000 mg/kg | Freq: Once | INTRAVENOUS | Status: AC
  Administered 2018-10-11: 1000 mg via INTRAVENOUS
  Filled 2018-10-11: qty 20

## 2018-10-11 NOTE — Patient Instructions (Addendum)
Coronavirus (COVID-19) Are you at risk?  Are you at risk for the Coronavirus (COVID-19)?  To be considered HIGH RISK for Coronavirus (COVID-19), you have to meet the following criteria:  . Traveled to [place], [place], [place], [place] or [place]; or in the [place] to [place], [place], [place], or [place]; and have fever, cough, and shortness of breath within the last 2 weeks of travel OR . Been in close contact with a person diagnosed with COVID-19 within the last 2 weeks and have fever, cough, and shortness of breath . IF YOU DO NOT MEET THESE CRITERIA, YOU ARE CONSIDERED LOW RISK FOR COVID-19.  What to do if you are HIGH RISK for COVID-19?  . If you are having a medical emergency, call 911. . Seek medical care right away. Before you go to a doctor's office, urgent care or emergency department, call ahead and tell them about your recent travel, contact with someone diagnosed with COVID-19, and your symptoms. You should receive instructions from your physician's office regarding next steps of care.  . When you arrive at healthcare provider, tell the healthcare staff immediately you have returned from visiting [place], [place], [place], [place] or [place]; or traveled in the [place] to [place], [place], [place], or [place]; in the last two weeks or you have been in close contact with a person diagnosed with COVID-19 in the last 2 weeks.   . Tell the health care staff about your symptoms: fever, cough and shortness of breath. . After you have been seen by a medical provider, you will be either: o Tested for (COVID-19) and discharged home on quarantine except to seek medical care if symptoms worsen, and asked to  - Stay home and avoid contact with others until you get your results (4-5 days)  - Avoid travel on public transportation if possible (such as bus, train, or airplane) or o Sent to the Emergency Department by EMS for evaluation, COVID-19 testing, and possible  admission depending on your condition and test results.  What to do if you are LOW RISK for COVID-19?  Reduce your risk of any infection by using the same precautions used for avoiding the common cold or flu:  . Wash your hands often with soap and warm water for at least 20 seconds.  If soap and water are not readily available, use an alcohol-based hand sanitizer with at least 60% alcohol.  . If coughing or sneezing, cover your mouth and nose by coughing or sneezing into the elbow areas of your shirt or coat, into a tissue or into your sleeve (not your hands). . Avoid shaking hands with others and consider head nods or verbal greetings only. . Avoid touching your eyes, nose, or mouth with unwashed hands.  . Avoid close contact with people who are sick. . Avoid places or events with large numbers of people in one location, like concerts or sporting events. . Carefully consider travel plans you have or are making. . If you are planning any travel outside or inside the [place], visit the CDC's Travelers' Health webpage for the latest health notices. . If you have some symptoms but not all symptoms, continue to monitor at home and seek medical attention if your symptoms worsen. . If you are having a medical emergency, call 911.   ADDITIONAL HEALTHCARE OPTIONS FOR PATIENTS  [facility] / e-Visit: [email or web address]         [facility]: [phone]  [place]   Urgent Care: [facility]: [place] Discharge Instructions for Patients Receiving Chemotherapy  Today you received the following chemotherapy agents :  Imfinzi.   To help prevent nausea and vomiting after your treatment, we encourage you to take your nausea medication as prescribed.   If you develop nausea and vomiting that is not controlled by your nausea medication, call the clinic.    BELOW ARE SYMPTOMS THAT SHOULD BE REPORTED IMMEDIATELY:  *FEVER GREATER THAN 100.5 F  *CHILLS WITH OR WITHOUT FEVER  NAUSEA AND VOMITING THAT IS NOT CONTROLLED WITH YOUR NAUSEA MEDICATION  *UNUSUAL SHORTNESS OF BREATH  *UNUSUAL BRUISING OR BLEEDING  TENDERNESS IN MOUTH AND THROAT WITH OR WITHOUT PRESENCE OF ULCERS  *URINARY PROBLEMS  *BOWEL PROBLEMS  UNUSUAL RASH Items with * indicate a potential emergency and should be followed up as soon as possible.  Feel free to call the clinic should you have any questions or concerns. The clinic phone number is [phone].  Please show the [facility] at check-in to the Emergency Department and triage nurse.

## 2018-10-11 NOTE — Progress Notes (Signed)
[facility] OFFICE PROGRESS NOTE  [name], MD [place] [address]  DIAGNOSIS: stage IIIB (T3, N2, M0) non-small cell lung cancer, squamous cell carcinoma presented with large left lower lobe lung mass in addition to mediastinal lymphadenopathy diagnosed in October 2019. PDL 1 expression is negative  PRIOR THERAPY: Concurrent chemoradiation with weekly carboplatin for AUC of 2 and paclitaxel 45 mg/M2. Status post 7cycles with partial response.  CURRENT THERAPY: Consolidation treatment with immunotherapy with Imfinzi 10 mg/KG every 2 weeks. First dose July 31, 2018.Status post5cycles.   INTERVAL HISTORY: Deborah [name] 63 y.o. female returns to the clinic for a follow-up visit. The patient is feeling well today without any concerning complaints. She denies any fever, chills, night sweats, or weight loss. She reports her usual baseline shortness of breath and cough secondary to COPD for which she is on 2 L of home oxygen. Approximately 2 weeks ago the patient had anterior chest soreness that was exacerbated by coughing or breathing deeply. She associates her chest soreness to increased coughing. This has since resolved. She denies hemoptysis.  She denies any chest pain or hemoptysis.  She denies any nausea, vomiting, diarrhea, or constipation.  She denies any headache or visual changes.  She denies any rashes or skin changes. She continues to tolerate treatment with Imfinzi well without any adverse effects except for mild fatigue. She is here today for evaluation prior to starting cycle #6 today of treatment.   MEDICAL HISTORY: Past Medical History:  Diagnosis Date  . Anemia    as teen  . Asthma   . CHF (congestive heart failure) ([place])   . COPD, mild ([place])   . Depression   . Diverticulitis   . Family history of anesthesia complication    vomiting  . GI bleeding   . Heart failure ([place])    New onset 07/25/14  . Histoplasmosis    left  eye  . Hyperkalemia   . Hypertension   . Obesity (BMI 30-39.9)   . Pneumonia    dx wtih pneumonia on 05/27/16- seen by [facility]   . PONV (postoperative nausea and vomiting)   . Restrictive lung disease   . Shortness of breath    with exertion   . Sleep apnea    mask and oxygen at nite for sleep at 2L   . Tobacco abuse   . Umbilical hernia     ALLERGIES:  has No Known Allergies.  MEDICATIONS:  Current Outpatient Medications  Medication Sig Dispense Refill  . acetaminophen (TYLENOL) 500 MG tablet Take 1,500 mg by mouth 2 (two) times daily as needed for moderate pain or headache.    . albuterol (PROVENTIL) (2.5 MG/3ML) 0.083% nebulizer solution USE 1 VIAL VIA NEBULIZER EVERY 3 HOURS AS NEEDED FOR WHEEZING OR SHORTNESS OF BREATH (Patient taking differently: Take 2.5 mg by nebulization every 3 (three) hours as needed for wheezing or shortness of breath. ) 1125 mL 1  . aspirin 81 MG chewable tablet Chew 1 tablet (81 mg total) by mouth daily. 30 tablet 1  . Cholecalciferol (D3 ADULT PO) Take 1,000 mg by mouth.    . diltiazem (CARDIZEM CD) 120 MG 24 hr capsule TAKE 1 CAPSULE BY MOUTH EVERY DAY 90 capsule 1  . furosemide (LASIX) 20 MG tablet TAKE 2 TABLETS EACH MORNING AND 1 AT 3 PM 270 tablet 0  . ipratropium-albuterol (DUONEB) 0.5-2.5 (3) MG/3ML SOLN USE 1 VIAL VIA NEBULIZER EVERY 6 HOURS AS NEEDED (  Patient taking differently: Inhale 3 mLs into the lungs every 6 (six) hours as needed (shortness of breath or wheezing). ) 360 mL 2  . montelukast (SINGULAIR) 10 MG tablet TAKE 1 TABLET(10 MG) BY MOUTH AT BEDTIME 90 tablet 1  . OXYGEN Inhale 4 L/min into the lungs continuous.     . potassium chloride SA (KLOR-CON M20) 20 MEQ tablet TAKE 1/2 TABLET(10 MEQ) BY MOUTH DAILY 45 tablet 1  . Spacer/Aero-Holding Chambers (AEROCHAMBER MV) inhaler Use as instructed 1 each 0  . VENTOLIN HFA 108 (90 Base) MCG/ACT inhaler INHALE 2 PUFFS BY MOUTH EVERY 6 HOURS AS NEEDED FOR WHEEZING OR SHORTNESS OF BREATH 18 g  5  . vitamin B-12 (CYANOCOBALAMIN) 500 MCG tablet Take 500 mcg by mouth daily.    . prochlorperazine (COMPAZINE) 10 MG tablet TAKE 1 TABLET BY MOUTH EVERY 6 HOURS AS NEEDED FOR NAUSEA OR VOMITING. (Patient not taking: Reported on 10/11/2018) 30 tablet 0   No current facility-administered medications for this visit.    Facility-Administered Medications Ordered in Other Visits  Medication Dose Route Frequency Provider Last Rate Last Dose  . 0.9 %  sodium chloride infusion   Intravenous Once Curt Bears, MD      . durvalumab (IMFINZI) 1,020 mg in sodium chloride 0.9 % 100 mL chemo infusion  10 mg/kg (Treatment Plan Recorded) Intravenous Once Curt Bears, MD        SURGICAL HISTORY:  Past Surgical History:  Procedure Laterality Date  . APPLICATION OF WOUND VAC  03/19/2013   Procedure: APPLICATION OF WOUND VAC;  Surgeon: Madilyn Hook, DO;  Location: [facility];  Service: General;;  . CESAREAN SECTION  04/12/85  . COLONOSCOPY WITH PROPOFOL N/A 06/13/2016   Procedure: COLONOSCOPY WITH PROPOFOL;  Surgeon: Jerene Bears, MD;  Location: [facility];  Service: Gastroenterology;  Laterality: N/A;  . ERCP N/A 02/15/2017   Procedure: ENDOSCOPIC RETROGRADE CHOLANGIOPANCREATOGRAPHY (ERCP);  Surgeon: [name], MD;  Location: [facility];  Service: Endoscopy;  Laterality: N/A;  . INSERTION OF MESH N/A 03/19/2013   Procedure: INSERTION OF MESH;  Surgeon: Madilyn Hook, DO;  Location: [facility];  Service: General;  Laterality: N/A;  . VENTRAL HERNIA REPAIR N/A 03/19/2013   Procedure:  OPEN VENTRAL HERNIA REPAIR WITH MESH AND APPLICATION OF WOUND VAC;  Surgeon: Madilyn Hook, DO;  Location: [facility];  Service: General;  Laterality: N/A;  . VIDEO BRONCHOSCOPY Bilateral 04/19/2018   Procedure: VIDEO BRONCHOSCOPY WITH FLUORO;  Surgeon: Rigoberto Noel, MD;  Location: [facility];  Service: Cardiopulmonary;  Laterality: Bilateral;    REVIEW OF SYSTEMS:   Review of Systems  Constitutional: Positive mild fatigue.  Negative for appetite change, chills, fever and unexpected weight change.  HENT:   Negative for mouth sores, nosebleeds, sore throat and trouble swallowing.   Eyes: Negative for eye problems and icterus.  Respiratory: Positive for baseline coughing, shortness of rbeath, and chest soreness. Negative for  Hemoptysis.  Cardiovascular: Negative for chest pain and leg swelling.  Gastrointestinal: Negative for abdominal pain, constipation, diarrhea, nausea and vomiting.  Genitourinary: Negative for bladder incontinence, difficulty urinating, dysuria, frequency and hematuria.   Musculoskeletal: Negative for back pain, gait problem, neck pain and neck stiffness.  Skin: Negative for itching and rash.  Neurological: Negative for dizziness, extremity weakness, gait problem, headaches, light-headedness and seizures.  Hematological: Negative for adenopathy. Does not bruise/bleed easily.  Psychiatric/Behavioral: Negative for confusion, depression and sleep disturbance. The patient is not nervous/anxious.     PHYSICAL EXAMINATION:  Blood  pressure 130/88, pulse (!) 130, temperature 98 F (36.7 C), temperature source Oral, resp. rate 20, height 5\' 2"  (1.575 m), weight 233 lb (105.7 kg), SpO2 91 %.  ECOG PERFORMANCE STATUS: 1 - Symptomatic but completely ambulatory  Physical Exam  Constitutional: Oriented to person, place, and time and well-developed, well-nourished, and in no distress. No distress.  HENT:  Head: Normocephalic and atraumatic.  Mouth/Throat: Oropharynx is clear and moist. No oropharyngeal exudate.  Eyes: Conjunctivae are normal. Right eye exhibits no discharge. Left eye exhibits no discharge. No scleral icterus.  Neck: Normal range of motion. Neck supple.  Cardiovascular: Normal rate, regular rhythm, normal heart sounds and intact distal pulses. Mild tenderness to anterior chest with palpation. Pulmonary/Chest: Expiratory wheezes noted (improved from prior visits). Effort normal. No  respiratory distress. No rales.  Abdominal: Soft. Bowel sounds are normal. Exhibits no distension and no mass. There is no tenderness.  Musculoskeletal: Normal range of motion. Exhibits no edema. No tenderness, swelling, or erythema of the calves bilaterally. Lymphadenopathy:    No cervical adenopathy.  Neurological: Alert and oriented to person, place, and time. Exhibits normal muscle tone. Gait normal. Coordination normal.  Skin: Skin is warm and dry. No rash noted. Not diaphoretic. No erythema. No pallor.  Psychiatric: Mood, memory and judgment normal.  Vitals reviewed.  LABORATORY DATA: Lab Results  Component Value Date   WBC 8.0 10/11/2018   HGB 12.8 10/11/2018   HCT 41.6 10/11/2018   MCV 95.0 10/11/2018   PLT 245 10/11/2018      Chemistry      Component Value Date/Time   NA 142 10/11/2018 0809   NA 143 11/23/2016 1504   K 4.1 10/11/2018 0809   CL 101 10/11/2018 0809   CO2 29 10/11/2018 0809   BUN 17 10/11/2018 0809   BUN 14 11/23/2016 1504   CREATININE 0.91 10/11/2018 0809      Component Value Date/Time   CALCIUM 9.2 10/11/2018 0809   ALKPHOS 91 10/11/2018 0809   AST 12 (L) 10/11/2018 0809   ALT 10 10/11/2018 0809   BILITOT 0.2 (L) 10/11/2018 0809       RADIOGRAPHIC STUDIES:  No results found.   ASSESSMENT/PLAN:  This is a very pleasant 64 year old Caucasian female with stage IIIb non-small cell lung cancer, squamous cell carcinoma.  She presented with a large left lower lobe lung mass in addition to mediastinal lymphadenopathy.  She was diagnosed in October 2019.  Her PDL 1 expression is negative. She underwent a course of concurrent chemoradiation with weekly carboplatin and paclitaxel.  She is status post 7 cycles.  She tolerated treatment well without any adverse side effects except for mild odynophagia. She showed significant improvement of her disease and almost complete resolution of the left lower lobe lung mass as well as mediastinal  lymphadenopathy. She is currently undergoing consolidation immunotherapy with Imfinzi 10 mg/kg every 2 weeks.  She is status post 5 cycles.  The patient was seen with Dr. Julien Nordmann today.  She continues to tolerate her treatment well without any concerning adverse effects except for mild fatigue.  She continues to experience baseline shortness of breath secondary to her COPD.  She is currently on 2 L of home oxygen.  The labs were reviewed with the patient.  We recommend that she proceed with cycle #6 today as scheduled.  I will arrange for a restaging CT scan to be performed prior to her next appointment in 2 weeks.  We will see her back at that time for  evaluation and discussion of her scan results prior to starting cycle #7.  The patient was advised to call immediately if she has any concerning symptoms in the interval. The patient voices understanding of current disease status and treatment options and is in agreement with the current care plan. All questions were answered. The patient knows to call the clinic with any problems, questions or concerns. We can certainly see the patient much sooner if necessary   Orders Placed This Encounter  Procedures  . CT Chest W Contrast    Standing Status:   Future    Standing Expiration Date:   10/11/2019    Order Specific Question:   ** REASON FOR EXAM (FREE TEXT)    Answer:   Restaging Lung Cancer    Order Specific Question:   If indicated for the ordered procedure, I authorize the administration of contrast media per Radiology protocol    Answer:   Yes    Order Specific Question:   Is patient pregnant?    Answer:   No    Order Specific Question:   Preferred imaging location?    Answer:   [facility]    Order Specific Question:   Radiology Contrast Protocol - do NOT remove file path    Answer:   [email or web address]     Deborah Filosa L Arvin Abello, PA-C 10/11/18  ADDENDUM: Hematology/Oncology Attending: I had a  face-to-face encounter with the patient today.  I recommended her care plan.  This is a very pleasant 63 years old white female with a stage IIIb non-small cell lung cancer, squamous cell carcinoma status post a course of concurrent chemoradiation with weekly carboplatin and paclitaxel and she is currently on consolidation treatment with immunotherapy with Imfinzi status post 5 cycles. The patient has been tolerating this treatment well with no concerning adverse effects. I recommended for the patient to proceed with cycle #6 today as scheduled. I will see her back for follow-up visit in 2 weeks for evaluation after repeating CT scan of the chest for restaging of her disease. The patient was advised to call immediately if she has any concerning symptoms in the interval.  Disclaimer: This note was dictated with voice recognition software. Similar sounding words can inadvertently be transcribed and may be missed upon review. Eilleen Kempf, MD 10/11/18

## 2018-10-14 ENCOUNTER — Telehealth: Payer: Self-pay | Admitting: Physician Assistant

## 2018-10-14 NOTE — Telephone Encounter (Signed)
Patient is already scheduled per 03/27 los.

## 2018-10-21 ENCOUNTER — Ambulatory Visit (HOSPITAL_COMMUNITY)
Admission: RE | Admit: 2018-10-21 | Discharge: 2018-10-21 | Disposition: A | Discharge status: Home / Self Care | Payer: BLUE CROSS/BLUE SHIELD | Source: Ambulatory Visit | Attending: Physician Assistant | Admitting: Physician Assistant

## 2018-10-21 ENCOUNTER — Other Ambulatory Visit: Payer: Self-pay

## 2018-10-21 ENCOUNTER — Encounter (HOSPITAL_COMMUNITY): Payer: Self-pay

## 2018-10-21 DIAGNOSIS — C3432 Malignant neoplasm of lower lobe, left bronchus or lung: Secondary | ICD-10-CM

## 2018-10-21 DIAGNOSIS — C3492 Malignant neoplasm of unspecified part of left bronchus or lung: Secondary | ICD-10-CM | POA: Diagnosis not present

## 2018-10-21 MED ORDER — IOHEXOL 300 MG/ML  SOLN
75.0000 mL | Freq: Once | INTRAMUSCULAR | Status: AC | PRN
  Administered 2018-10-21: 07:00:00 75 mL via INTRAVENOUS

## 2018-10-22 ENCOUNTER — Inpatient Hospital Stay: Payer: BLUE CROSS/BLUE SHIELD

## 2018-10-22 ENCOUNTER — Inpatient Hospital Stay: Payer: BLUE CROSS/BLUE SHIELD | Attending: Internal Medicine

## 2018-10-22 ENCOUNTER — Inpatient Hospital Stay (HOSPITAL_BASED_OUTPATIENT_CLINIC_OR_DEPARTMENT_OTHER): Payer: BLUE CROSS/BLUE SHIELD | Admitting: Internal Medicine

## 2018-10-22 ENCOUNTER — Other Ambulatory Visit: Payer: Self-pay

## 2018-10-22 ENCOUNTER — Encounter: Payer: Self-pay | Admitting: Internal Medicine

## 2018-10-22 VITALS — BP 126/82 | HR 115 | Temp 97.6°F | Resp 18 | Ht 62.0 in | Wt 232.2 lb

## 2018-10-22 DIAGNOSIS — Z79899 Other long term (current) drug therapy: Secondary | ICD-10-CM

## 2018-10-22 DIAGNOSIS — Z720 Tobacco use: Secondary | ICD-10-CM

## 2018-10-22 DIAGNOSIS — J432 Centrilobular emphysema: Secondary | ICD-10-CM

## 2018-10-22 DIAGNOSIS — Z5112 Encounter for antineoplastic immunotherapy: Secondary | ICD-10-CM

## 2018-10-22 DIAGNOSIS — R911 Solitary pulmonary nodule: Secondary | ICD-10-CM | POA: Diagnosis not present

## 2018-10-22 DIAGNOSIS — C3432 Malignant neoplasm of lower lobe, left bronchus or lung: Secondary | ICD-10-CM

## 2018-10-22 LAB — CBC WITH DIFFERENTIAL (CANCER CENTER ONLY)
Abs Immature Granulocytes: 0.02 10*3/uL (ref 0.00–0.07)
Basophils Absolute: 0.1 10*3/uL (ref 0.0–0.1)
Basophils Relative: 1 %
Eosinophils Absolute: 0.2 10*3/uL (ref 0.0–0.5)
Eosinophils Relative: 2 %
HCT: 42.2 % (ref 36.0–46.0)
Hemoglobin: 12.8 g/dL (ref 12.0–15.0)
Immature Granulocytes: 0 %
Lymphocytes Relative: 12 %
Lymphs Abs: 0.9 10*3/uL (ref 0.7–4.0)
MCH: 29 pg (ref 26.0–34.0)
MCHC: 30.3 g/dL (ref 30.0–36.0)
MCV: 95.5 fL (ref 80.0–100.0)
Monocytes Absolute: 0.6 10*3/uL (ref 0.1–1.0)
Monocytes Relative: 8 %
Neutro Abs: 5.9 10*3/uL (ref 1.7–7.7)
Neutrophils Relative %: 77 %
Platelet Count: 242 10*3/uL (ref 150–400)
RBC: 4.42 MIL/uL (ref 3.87–5.11)
RDW: 13.2 % (ref 11.5–15.5)
WBC Count: 7.6 10*3/uL (ref 4.0–10.5)
nRBC: 0 % (ref 0.0–0.2)

## 2018-10-22 LAB — CMP (CANCER CENTER ONLY)
ALT: 11 U/L (ref 0–44)
AST: 14 U/L — ABNORMAL LOW (ref 15–41)
Albumin: 3.3 g/dL — ABNORMAL LOW (ref 3.5–5.0)
Alkaline Phosphatase: 96 U/L (ref 38–126)
Anion gap: 16 — ABNORMAL HIGH (ref 5–15)
BUN: 16 mg/dL (ref 6–20)
CO2: 29 mmol/L (ref 22–32)
Calcium: 9.4 mg/dL (ref 8.9–10.3)
Chloride: 96 mmol/L — ABNORMAL LOW (ref 98–111)
Creatinine: 0.99 mg/dL (ref 0.44–1.00)
GFR, Est AFR Am: >60 mL/min (ref >60)
GFR, Estimated: >60 mL/min (ref >60)
Glucose, Bld: 115 mg/dL — ABNORMAL HIGH (ref 70–99)
Potassium: 3.9 mmol/L (ref 3.5–5.1)
Sodium: 141 mmol/L (ref 135–145)
Total Bilirubin: 0.3 mg/dL (ref 0.3–1.2)
Total Protein: 7.9 g/dL (ref 6.5–8.1)

## 2018-10-22 LAB — TSH: TSH: 3.603 u[IU]/mL (ref 0.308–3.960)

## 2018-10-22 MED ORDER — SODIUM CHLORIDE 0.9 % IV SOLN
9.8000 mg/kg | Freq: Once | INTRAVENOUS | Status: AC
  Administered 2018-10-22: 1000 mg via INTRAVENOUS
  Filled 2018-10-22: qty 20

## 2018-10-22 MED ORDER — SODIUM CHLORIDE 0.9 % IV SOLN
Freq: Once | INTRAVENOUS | Status: AC
  Administered 2018-10-22: 09:00:00 via INTRAVENOUS
  Filled 2018-10-22: qty 250

## 2018-10-22 NOTE — Progress Notes (Signed)
[place] [phone]) [phone]   [phone]  OFFICE PROGRESS NOTE  [name], MD [place] [address]  DIAGNOSIS: stage IIIB (T3, N2, M0) non-small cell lung cancer, squamous cell carcinoma presented with large left lower lobe lung mass in addition to mediastinal lymphadenopathy diagnosed in October 2019. PDL 1 expression is negative  PRIOR THERAPY: Concurrent chemoradiation with weekly carboplatin for AUC of 2 and paclitaxel 45 mg/M2.  Status post 7 cycles with partial response.Marland Kitchen  CURRENT THERAPY: Consolidation treatment with immunotherapy with Imfinzi 10 mg/KG every 2 weeks.  First dose July 31, 2018.  Status post 6 cycles.  INTERVAL HISTORY: Deborah [name] 63 y.o. female returns to the clinic today for follow-up visit.  The patient is feeling fine today with no concerning complaints except for dry cough.  She denied having any chest pain but has shortness of breath at baseline and she is currently on home oxygen.  She has no hemoptysis.  The patient denied having any nausea, vomiting, diarrhea or constipation.  She denied having any headache or visual changes.  She has no weight loss or night sweats.  She has no fever or chills.  She continues to tolerate her treatment with immunotherapy fairly well.  She is here today for evaluation before starting cycle #7 of her treatment.   MEDICAL HISTORY: Past Medical History:  Diagnosis Date   Anemia    as teen   Asthma    CHF (congestive heart failure) (HCC)    COPD, mild (HCC)    Depression    Diverticulitis    Family history of anesthesia complication    vomiting   GI bleeding    Heart failure ([place])    New onset 07/25/14   Histoplasmosis    left eye   Hyperkalemia    Hypertension    Obesity (BMI 30-39.9)    Pneumonia    dx wtih pneumonia on 05/27/16- seen by [facility]    PONV (postoperative nausea and vomiting)    Restrictive lung disease     Shortness of breath    with exertion    Sleep apnea    mask and oxygen at nite for sleep at 2L    Tobacco abuse    Umbilical hernia     ALLERGIES:  has No Known Allergies.  MEDICATIONS:  Current Outpatient Medications  Medication Sig Dispense Refill   acetaminophen (TYLENOL) 500 MG tablet Take 1,500 mg by mouth 2 (two) times daily as needed for moderate pain or headache.     albuterol (PROVENTIL) (2.5 MG/3ML) 0.083% nebulizer solution USE 1 VIAL VIA NEBULIZER EVERY 3 HOURS AS NEEDED FOR WHEEZING OR SHORTNESS OF BREATH (Patient taking differently: Take 2.5 mg by nebulization every 3 (three) hours as needed for wheezing or shortness of breath. ) 1125 mL 1   aspirin 81 MG chewable tablet Chew 1 tablet (81 mg total) by mouth daily. 30 tablet 1   Cholecalciferol (D3 ADULT PO) Take 1,000 mg by mouth.     diltiazem (CARDIZEM CD) 120 MG 24 hr capsule TAKE 1 CAPSULE BY MOUTH EVERY DAY 90 capsule 1   furosemide (LASIX) 20 MG tablet TAKE 2 TABLETS EACH MORNING AND 1 AT 3 PM 270 tablet 0   ipratropium-albuterol (DUONEB) 0.5-2.5 (3) MG/3ML SOLN USE 1 VIAL VIA NEBULIZER EVERY 6 HOURS AS NEEDED (Patient taking differently: Inhale 3 mLs into the lungs every 6 (six) hours as needed (shortness of breath or wheezing). )  360 mL 2   montelukast (SINGULAIR) 10 MG tablet TAKE 1 TABLET(10 MG) BY MOUTH AT BEDTIME 90 tablet 1   OXYGEN Inhale 4 L/min into the lungs continuous.      potassium chloride SA (KLOR-CON M20) 20 MEQ tablet TAKE 1/2 TABLET(10 MEQ) BY MOUTH DAILY 45 tablet 1   prochlorperazine (COMPAZINE) 10 MG tablet TAKE 1 TABLET BY MOUTH EVERY 6 HOURS AS NEEDED FOR NAUSEA OR VOMITING. (Patient not taking: Reported on 10/11/2018) 30 tablet 0   Spacer/Aero-Holding Chambers (AEROCHAMBER MV) inhaler Use as instructed 1 each 0   VENTOLIN HFA 108 (90 Base) MCG/ACT inhaler INHALE 2 PUFFS BY MOUTH EVERY 6 HOURS AS NEEDED FOR WHEEZING OR SHORTNESS OF BREATH 18 g 5   vitamin B-12 (CYANOCOBALAMIN) 500  MCG tablet Take 500 mcg by mouth daily.     No current facility-administered medications for this visit.     SURGICAL HISTORY:  Past Surgical History:  Procedure Laterality Date   APPLICATION OF WOUND VAC  03/19/2013   Procedure: APPLICATION OF WOUND VAC;  Surgeon: Madilyn Hook, DO;  Location: [facility];  Service: General;;   CESAREAN SECTION  04/12/85   COLONOSCOPY WITH PROPOFOL N/A 06/13/2016   Procedure: COLONOSCOPY WITH PROPOFOL;  Surgeon: Jerene Bears, MD;  Location: [facility];  Service: Gastroenterology;  Laterality: N/A;   ERCP N/A 02/15/2017   Procedure: ENDOSCOPIC RETROGRADE CHOLANGIOPANCREATOGRAPHY (ERCP);  Surgeon: [name], MD;  Location: [facility];  Service: Endoscopy;  Laterality: N/A;   INSERTION OF MESH N/A 03/19/2013   Procedure: INSERTION OF MESH;  Surgeon: Madilyn Hook, DO;  Location: [facility];  Service: General;  Laterality: N/A;   VENTRAL HERNIA REPAIR N/A 03/19/2013   Procedure:  OPEN VENTRAL HERNIA REPAIR WITH MESH AND APPLICATION OF WOUND VAC;  Surgeon: Madilyn Hook, DO;  Location: [facility];  Service: General;  Laterality: N/A;   VIDEO BRONCHOSCOPY Bilateral 04/19/2018   Procedure: VIDEO BRONCHOSCOPY WITH FLUORO;  Surgeon: Rigoberto Noel, MD;  Location: [facility];  Service: Cardiopulmonary;  Laterality: Bilateral;    REVIEW OF SYSTEMS:  Constitutional: positive for fatigue Eyes: negative Ears, nose, mouth, throat, and face: negative Respiratory: positive for cough and dyspnea on exertion Cardiovascular: negative Gastrointestinal: negative Genitourinary:negative Integument/breast: negative Hematologic/lymphatic: negative Musculoskeletal:negative Neurological: negative Behavioral/Psych: negative Endocrine: negative Allergic/Immunologic: negative   PHYSICAL EXAMINATION: General appearance: alert, cooperative and no distress Head: Normocephalic, without obvious abnormality, atraumatic Neck: no adenopathy, no JVD, supple, symmetrical, trachea midline  and thyroid not enlarged, symmetric, no tenderness/mass/nodules Lymph nodes: Cervical, supraclavicular, and axillary nodes normal. Resp: clear to auscultation bilaterally Back: symmetric, no curvature. ROM normal. No CVA tenderness. Cardio: regular rate and rhythm, S1, S2 normal, no murmur, click, rub or gallop GI: soft, non-tender; bowel sounds normal; no masses,  no organomegaly Extremities: extremities normal, atraumatic, no cyanosis or edema Neurologic: Alert and oriented X 3, normal strength and tone. Normal symmetric reflexes. Normal coordination and gait  ECOG PERFORMANCE STATUS: 1 - Symptomatic but completely ambulatory  Blood pressure 126/82, pulse (!) 115, temperature 97.6 F (36.4 C), temperature source Oral, resp. rate 18, height 5\' 2"  (1.575 m), weight 232 lb 3.2 oz (105.3 kg), SpO2 94 %, peak flow (!) 2 L/min.  LABORATORY DATA: Lab Results  Component Value Date   WBC 7.6 10/22/2018   HGB 12.8 10/22/2018   HCT 42.2 10/22/2018   MCV 95.5 10/22/2018   PLT 242 10/22/2018      Chemistry      Component Value Date/Time   NA  142 10/11/2018 0809   NA 143 11/23/2016 1504   K 4.1 10/11/2018 0809   CL 101 10/11/2018 0809   CO2 29 10/11/2018 0809   BUN 17 10/11/2018 0809   BUN 14 11/23/2016 1504   CREATININE 0.91 10/11/2018 0809      Component Value Date/Time   CALCIUM 9.2 10/11/2018 0809   ALKPHOS 91 10/11/2018 0809   AST 12 (L) 10/11/2018 0809   ALT 10 10/11/2018 0809   BILITOT 0.2 (L) 10/11/2018 0809       RADIOGRAPHIC STUDIES: Ct Chest W Contrast  Result Date: 10/21/2018 CLINICAL DATA:  Lung cancer. EXAM: CT CHEST WITH CONTRAST TECHNIQUE: Multidetector CT imaging of the chest was performed during intravenous contrast administration. CONTRAST:  69mL OMNIPAQUE IOHEXOL 300 MG/ML  SOLN COMPARISON:  07/19/2018 FINDINGS: Cardiovascular: The heart size is normal. No substantial pericardial effusion. Coronary artery calcification is evident. Atherosclerotic calcification  is noted in the wall of the thoracic aorta. Mediastinum/Nodes: No mediastinal lymphadenopathy. There is no hilar lymphadenopathy. The esophagus has normal imaging features. There is no axillary lymphadenopathy. Lungs/Pleura: The central tracheobronchial airways are patent. Centrilobular and paraseptal emphysema evident. Calcified granulomata again noted at the right posterior lung base. Small subpleural nodule posterior right costophrenic sulcus (121/7) likely atelectatic. The cystic area identified previously in the left lower lobe is again noted. The surrounding ground-glass attenuation persists with interval development of a 10 x 5 mm adjacent nodular opacity (58/7). Patchy airspace disease noted anterior left upper lobe (58/7) with a new 6 mm left upper lobe ground-glass nodule visible on 77/7. Patchy airspace opacity posterior left lower lobe seen on the previous study has improved but not resolved (see 97/7). No pleural effusion. Upper Abdomen: The gallbladder wall thickening and enhancement seen on the previous study has decreased in the interval. There is some persistent soft tissue thickening in the region of the gallbladder fundus but appears tethered to the hepatic flexure. Musculoskeletal: No worrisome lytic or sclerotic osseous abnormality. IMPRESSION: 1. Interval development of new patchy airspace opacity in the anterior left upper lobe with new 10 x 5 mm nodule adjacent to the previously described left lower lobe cystic lesion. These changes may reflect sequelae of prior radiation and/or infectious/inflammatory etiology. Close attention on follow-up recommended to exclude recurrent disease. 2. Interval decrease in wall thickening and enhancement of the gallbladder although there is persistent abnormal soft tissue attenuation in the region of the gallbladder fundus, tracking to the hepatic flexure. Interval improvement is reassuring, but continued close attention recommended. 3.  Emphysema.  (ICD10-J43.9) 4.  Aortic Atherosclerois (ICD10-170.0) Electronically Signed   By: Misty Stanley M.D.   On: 10/21/2018 08:09    ASSESSMENT AND PLAN: This is a very pleasant 63 years old white female with stage IIIb non-small cell lung cancer, squamous cell carcinoma. She underwent a course of concurrent chemoradiation with weekly carboplatin and paclitaxel, status post 7 cycles.  She tolerated this treatment well with no concerning adverse effect except for fatigue. She is currently undergoing consolidation treatment with immunotherapy with Imfinzi status post 6 cycles. She has been tolerating this treatment well with no concerning adverse effects. She had repeat CT scan of the chest performed recently.  I personally and independently reviewed the scan images and discussed the results with the patient today. Her scan showed no concerning findings for disease progression but there was new nodule in the left lower lobe suspicious for inflammatory process and will need close observation on upcoming scan. I recommended for the patient to  continue her current treatment and she will proceed with cycle #7 of Imfinzi today. The patient will come back for follow-up visit in 2 weeks for evaluation before starting cycle #8. She was advised to call immediately if she has any concerning symptoms in the interval. The patient voices understanding of current disease status and treatment options and is in agreement with the current care plan.  All questions were answered. The patient knows to call the clinic with any problems, questions or concerns. We can certainly see the patient much sooner if necessary.  Disclaimer: This note was dictated with voice recognition software. Similar sounding words can inadvertently be transcribed and may not be corrected upon review.

## 2018-10-22 NOTE — Patient Instructions (Signed)
[facility] Discharge Instructions for Patients Receiving Chemotherapy  Today you received the following chemotherapy agents: Durvalumab (Imfinzi)   To help prevent nausea and vomiting after your treatment, we encourage you to take your nausea medication as directed.   If you develop nausea and vomiting that is not controlled by your nausea medication, call the clinic.   BELOW ARE SYMPTOMS THAT SHOULD BE REPORTED IMMEDIATELY:  *FEVER GREATER THAN 100.5 F  *CHILLS WITH OR WITHOUT FEVER  NAUSEA AND VOMITING THAT IS NOT CONTROLLED WITH YOUR NAUSEA MEDICATION  *UNUSUAL SHORTNESS OF BREATH  *UNUSUAL BRUISING OR BLEEDING  TENDERNESS IN MOUTH AND THROAT WITH OR WITHOUT PRESENCE OF ULCERS  *URINARY PROBLEMS  *BOWEL PROBLEMS  UNUSUAL RASH Items with * indicate a potential emergency and should be followed up as soon as possible.  Feel free to call the clinic should you have any questions or concerns. The clinic phone number is [phone].  Please show the CHEMO ALERT CARD at check-in to the Emergency Department and triage nurse.   

## 2018-10-25 ENCOUNTER — Other Ambulatory Visit: Payer: Self-pay | Admitting: Family Medicine

## 2018-10-28 NOTE — Telephone Encounter (Signed)
TA-Looks like pt has not been seen in a year here but she has been seen a lot at [facility] for lung CA/what would you want me to do?Plz advise/thx dmf

## 2018-11-04 ENCOUNTER — Other Ambulatory Visit: Payer: Self-pay | Admitting: Family Medicine

## 2018-11-05 ENCOUNTER — Other Ambulatory Visit: Payer: Self-pay

## 2018-11-05 ENCOUNTER — Inpatient Hospital Stay: Payer: BLUE CROSS/BLUE SHIELD

## 2018-11-05 ENCOUNTER — Encounter: Payer: Self-pay | Admitting: Internal Medicine

## 2018-11-05 ENCOUNTER — Inpatient Hospital Stay (HOSPITAL_BASED_OUTPATIENT_CLINIC_OR_DEPARTMENT_OTHER): Payer: BLUE CROSS/BLUE SHIELD | Admitting: Internal Medicine

## 2018-11-05 VITALS — HR 89

## 2018-11-05 VITALS — BP 108/85 | HR 112 | Temp 97.9°F | Resp 18 | Ht 62.0 in | Wt 236.7 lb

## 2018-11-05 DIAGNOSIS — Z79899 Other long term (current) drug therapy: Secondary | ICD-10-CM | POA: Diagnosis not present

## 2018-11-05 DIAGNOSIS — Z5112 Encounter for antineoplastic immunotherapy: Secondary | ICD-10-CM | POA: Diagnosis not present

## 2018-11-05 DIAGNOSIS — C3432 Malignant neoplasm of lower lobe, left bronchus or lung: Secondary | ICD-10-CM

## 2018-11-05 LAB — CBC WITH DIFFERENTIAL (CANCER CENTER ONLY)
Abs Immature Granulocytes: 0.04 10*3/uL (ref 0.00–0.07)
Basophils Absolute: 0 10*3/uL (ref 0.0–0.1)
Basophils Relative: 1 %
Eosinophils Absolute: 0.2 10*3/uL (ref 0.0–0.5)
Eosinophils Relative: 3 %
HCT: 41.2 % (ref 36.0–46.0)
Hemoglobin: 12.6 g/dL (ref 12.0–15.0)
Immature Granulocytes: 1 %
Lymphocytes Relative: 12 %
Lymphs Abs: 1 10*3/uL (ref 0.7–4.0)
MCH: 28.6 pg (ref 26.0–34.0)
MCHC: 30.6 g/dL (ref 30.0–36.0)
MCV: 93.4 fL (ref 80.0–100.0)
Monocytes Absolute: 0.5 10*3/uL (ref 0.1–1.0)
Monocytes Relative: 7 %
Neutro Abs: 6 10*3/uL (ref 1.7–7.7)
Neutrophils Relative %: 76 %
Platelet Count: 237 10*3/uL (ref 150–400)
RBC: 4.41 MIL/uL (ref 3.87–5.11)
RDW: 13.5 % (ref 11.5–15.5)
WBC Count: 7.8 10*3/uL (ref 4.0–10.5)
nRBC: 0 % (ref 0.0–0.2)

## 2018-11-05 LAB — CMP (CANCER CENTER ONLY)
ALT: 14 U/L (ref 0–44)
AST: 14 U/L — ABNORMAL LOW (ref 15–41)
Albumin: 3.1 g/dL — ABNORMAL LOW (ref 3.5–5.0)
Alkaline Phosphatase: 93 U/L (ref 38–126)
Anion gap: 15 (ref 5–15)
BUN: 20 mg/dL (ref 6–20)
CO2: 26 mmol/L (ref 22–32)
Calcium: 8.8 mg/dL — ABNORMAL LOW (ref 8.9–10.3)
Chloride: 100 mmol/L (ref 98–111)
Creatinine: 0.97 mg/dL (ref 0.44–1.00)
GFR, Est AFR Am: >60 mL/min (ref >60)
GFR, Estimated: >60 mL/min (ref >60)
Glucose, Bld: 122 mg/dL — ABNORMAL HIGH (ref 70–99)
Potassium: 3.8 mmol/L (ref 3.5–5.1)
Sodium: 141 mmol/L (ref 135–145)
Total Bilirubin: 0.3 mg/dL (ref 0.3–1.2)
Total Protein: 7.5 g/dL (ref 6.5–8.1)

## 2018-11-05 LAB — TSH: TSH: 4.413 u[IU]/mL — ABNORMAL HIGH (ref 0.308–3.960)

## 2018-11-05 MED ORDER — SODIUM CHLORIDE 0.9 % IV SOLN
9.8000 mg/kg | Freq: Once | INTRAVENOUS | Status: AC
  Administered 2018-11-05: 1000 mg via INTRAVENOUS
  Filled 2018-11-05: qty 20

## 2018-11-05 MED ORDER — SODIUM CHLORIDE 0.9 % IV SOLN
Freq: Once | INTRAVENOUS | Status: AC
  Administered 2018-11-05: 09:00:00 via INTRAVENOUS
  Filled 2018-11-05: qty 250

## 2018-11-05 NOTE — Progress Notes (Signed)
[place] Telephone:([phone]   Fax:(336) [phone]  OFFICE PROGRESS NOTE  [name], MD [place] [address]  DIAGNOSIS: stage IIIB (T3, N2, M0) non-small cell lung cancer, squamous cell carcinoma presented with large left lower lobe lung mass in addition to mediastinal lymphadenopathy diagnosed in October 2019. PDL 1 expression is negative  PRIOR THERAPY: Concurrent chemoradiation with weekly carboplatin for AUC of 2 and paclitaxel 45 mg/M2.  Status post 7 cycles with partial response.Marland Kitchen  CURRENT THERAPY: Consolidation treatment with immunotherapy with Imfinzi 10 mg/KG every 2 weeks.  First dose July 31, 2018.  Status post 7 cycles.  INTERVAL HISTORY: Deborah [name] 63 y.o. female returns to the clinic today for follow-up visit.  The patient is feeling fine today with no concerning complaints except for the baseline shortness of breath as well as intermittent chest pain.  She denied having any cough or hemoptysis.  She denied having any recent weight loss or night sweats.  She has no nausea, vomiting, diarrhea or constipation.  She continues to tolerate her treatment with Imfinzi fairly well.  The patient is here today for evaluation before starting cycle #8.  MEDICAL HISTORY: Past Medical History:  Diagnosis Date  . Anemia    as teen  . Asthma   . CHF (congestive heart failure) ([place])   . COPD, mild ([place])   . Depression   . Diverticulitis   . Family history of anesthesia complication    vomiting  . GI bleeding   . Heart failure ([place])    New onset 07/25/14  . Histoplasmosis    left eye  . Hyperkalemia   . Hypertension   . Obesity (BMI 30-39.9)   . Pneumonia    dx wtih pneumonia on 05/27/16- seen by [facility]   . PONV (postoperative nausea and vomiting)   . Restrictive lung disease   . Shortness of breath    with exertion   . Sleep apnea    mask and oxygen at nite for sleep at 2L   . Tobacco abuse   . Umbilical  hernia     ALLERGIES:  has No Known Allergies.  MEDICATIONS:  Current Outpatient Medications  Medication Sig Dispense Refill  . acetaminophen (TYLENOL) 500 MG tablet Take 1,500 mg by mouth 2 (two) times daily as needed for moderate pain or headache.    . albuterol (PROVENTIL) (2.5 MG/3ML) 0.083% nebulizer solution USE 1 VIAL VIA NEBULIZER EVERY 3 HOURS AS NEEDED FOR WHEEZING OR SHORTNESS OF BREATH (Patient taking differently: Take 2.5 mg by nebulization every 3 (three) hours as needed for wheezing or shortness of breath. ) 1125 mL 1  . aspirin 81 MG chewable tablet Chew 1 tablet (81 mg total) by mouth daily. 30 tablet 1  . Cholecalciferol (D3 ADULT PO) Take 1,000 mg by mouth.    . diltiazem (CARDIZEM CD) 120 MG 24 hr capsule TAKE 1 CAPSULE BY MOUTH EVERY DAY 90 capsule 1  . furosemide (LASIX) 20 MG tablet TAKE 2 TABLETS EACH MORNING AND 1 AT 3 PM 270 tablet 0  . ipratropium-albuterol (DUONEB) 0.5-2.5 (3) MG/3ML SOLN USE 1 VIAL VIA NEBULIZER EVERY 6 HOURS AS NEEDED (Patient taking differently: Inhale 3 mLs into the lungs every 6 (six) hours as needed (shortness of breath or wheezing). ) 360 mL 2  . montelukast (SINGULAIR) 10 MG tablet TAKE 1 TABLET(10 MG) BY MOUTH AT BEDTIME 90 tablet 1  . OXYGEN Inhale 4 L/min  into the lungs continuous.     . potassium chloride SA (KLOR-CON M20) 20 MEQ tablet TAKE 1/2 TABLET BY MOUTH DAILY 45 tablet 1  . prochlorperazine (COMPAZINE) 10 MG tablet TAKE 1 TABLET BY MOUTH EVERY 6 HOURS AS NEEDED FOR NAUSEA OR VOMITING. (Patient not taking: Reported on 10/11/2018) 30 tablet 0  . Spacer/Aero-Holding Chambers (AEROCHAMBER MV) inhaler Use as instructed 1 each 0  . VENTOLIN HFA 108 (90 Base) MCG/ACT inhaler INHALE 2 PUFFS BY MOUTH EVERY 6 HOURS AS NEEDED FOR WHEEZING OR SHORTNESS OF BREATH 18 g 5  . vitamin B-12 (CYANOCOBALAMIN) 500 MCG tablet Take 500 mcg by mouth daily.     No current facility-administered medications for this visit.     SURGICAL HISTORY:  Past  Surgical History:  Procedure Laterality Date  . APPLICATION OF WOUND VAC  03/19/2013   Procedure: APPLICATION OF WOUND VAC;  Surgeon: Madilyn Hook, DO;  Location: [facility];  Service: General;;  . CESAREAN SECTION  04/12/85  . COLONOSCOPY WITH PROPOFOL N/A 06/13/2016   Procedure: COLONOSCOPY WITH PROPOFOL;  Surgeon: Jerene Bears, MD;  Location: [facility];  Service: Gastroenterology;  Laterality: N/A;  . ERCP N/A 02/15/2017   Procedure: ENDOSCOPIC RETROGRADE CHOLANGIOPANCREATOGRAPHY (ERCP);  Surgeon: [name], MD;  Location: [facility];  Service: Endoscopy;  Laterality: N/A;  . INSERTION OF MESH N/A 03/19/2013   Procedure: INSERTION OF MESH;  Surgeon: Madilyn Hook, DO;  Location: [facility];  Service: General;  Laterality: N/A;  . VENTRAL HERNIA REPAIR N/A 03/19/2013   Procedure:  OPEN VENTRAL HERNIA REPAIR WITH MESH AND APPLICATION OF WOUND VAC;  Surgeon: Madilyn Hook, DO;  Location: [facility];  Service: General;  Laterality: N/A;  . VIDEO BRONCHOSCOPY Bilateral 04/19/2018   Procedure: VIDEO BRONCHOSCOPY WITH FLUORO;  Surgeon: Rigoberto Noel, MD;  Location: [facility];  Service: Cardiopulmonary;  Laterality: Bilateral;    REVIEW OF SYSTEMS:  A comprehensive review of systems was negative except for: Constitutional: positive for fatigue Respiratory: positive for dyspnea on exertion and pleurisy/chest pain   PHYSICAL EXAMINATION: General appearance: alert, cooperative and no distress Head: Normocephalic, without obvious abnormality, atraumatic Neck: no adenopathy, no JVD, supple, symmetrical, trachea midline and thyroid not enlarged, symmetric, no tenderness/mass/nodules Lymph nodes: Cervical, supraclavicular, and axillary nodes normal. Resp: clear to auscultation bilaterally Back: symmetric, no curvature. ROM normal. No CVA tenderness. Cardio: regular rate and rhythm, S1, S2 normal, no murmur, click, rub or gallop GI: soft, non-tender; bowel sounds normal; no masses,  no organomegaly  Extremities: extremities normal, atraumatic, no cyanosis or edema  ECOG PERFORMANCE STATUS: 1 - Symptomatic but completely ambulatory  Blood pressure 108/85, pulse (!) 112, temperature 97.9 F (36.6 C), temperature source Oral, resp. rate 18, height 5\' 2"  (1.575 m), weight 236 lb 11.2 oz (107.4 kg), SpO2 96 %.  LABORATORY DATA: Lab Results  Component Value Date   WBC 7.8 11/05/2018   HGB 12.6 11/05/2018   HCT 41.2 11/05/2018   MCV 93.4 11/05/2018   PLT 237 11/05/2018      Chemistry      Component Value Date/Time   NA 141 10/22/2018 0806   NA 143 11/23/2016 1504   K 3.9 10/22/2018 0806   CL 96 (L) 10/22/2018 0806   CO2 29 10/22/2018 0806   BUN 16 10/22/2018 0806   BUN 14 11/23/2016 1504   CREATININE 0.99 10/22/2018 0806      Component Value Date/Time   CALCIUM 9.4 10/22/2018 0806   ALKPHOS 96 10/22/2018 0806  AST 14 (L) 10/22/2018 0806   ALT 11 10/22/2018 0806   BILITOT 0.3 10/22/2018 0806       RADIOGRAPHIC STUDIES: Ct Chest W Contrast  Result Date: 10/21/2018 CLINICAL DATA:  Lung cancer. EXAM: CT CHEST WITH CONTRAST TECHNIQUE: Multidetector CT imaging of the chest was performed during intravenous contrast administration. CONTRAST:  20mL OMNIPAQUE IOHEXOL 300 MG/ML  SOLN COMPARISON:  07/19/2018 FINDINGS: Cardiovascular: The heart size is normal. No substantial pericardial effusion. Coronary artery calcification is evident. Atherosclerotic calcification is noted in the wall of the thoracic aorta. Mediastinum/Nodes: No mediastinal lymphadenopathy. There is no hilar lymphadenopathy. The esophagus has normal imaging features. There is no axillary lymphadenopathy. Lungs/Pleura: The central tracheobronchial airways are patent. Centrilobular and paraseptal emphysema evident. Calcified granulomata again noted at the right posterior lung base. Small subpleural nodule posterior right costophrenic sulcus (121/7) likely atelectatic. The cystic area identified previously in the left  lower lobe is again noted. The surrounding ground-glass attenuation persists with interval development of a 10 x 5 mm adjacent nodular opacity (58/7). Patchy airspace disease noted anterior left upper lobe (58/7) with a new 6 mm left upper lobe ground-glass nodule visible on 77/7. Patchy airspace opacity posterior left lower lobe seen on the previous study has improved but not resolved (see 97/7). No pleural effusion. Upper Abdomen: The gallbladder wall thickening and enhancement seen on the previous study has decreased in the interval. There is some persistent soft tissue thickening in the region of the gallbladder fundus but appears tethered to the hepatic flexure. Musculoskeletal: No worrisome lytic or sclerotic osseous abnormality. IMPRESSION: 1. Interval development of new patchy airspace opacity in the anterior left upper lobe with new 10 x 5 mm nodule adjacent to the previously described left lower lobe cystic lesion. These changes may reflect sequelae of prior radiation and/or infectious/inflammatory etiology. Close attention on follow-up recommended to exclude recurrent disease. 2. Interval decrease in wall thickening and enhancement of the gallbladder although there is persistent abnormal soft tissue attenuation in the region of the gallbladder fundus, tracking to the hepatic flexure. Interval improvement is reassuring, but continued close attention recommended. 3.  Emphysema. (ICD10-J43.9) 4.  Aortic Atherosclerois (ICD10-170.0) Electronically Signed   By: Misty Stanley M.D.   On: 10/21/2018 08:09    ASSESSMENT AND PLAN: This is a very pleasant 63 years old white female with stage IIIb non-small cell lung cancer, squamous cell carcinoma. She underwent a course of concurrent chemoradiation with weekly carboplatin and paclitaxel, status post 7 cycles.  She tolerated this treatment well with no concerning adverse effect except for fatigue. She is currently undergoing consolidation treatment with  immunotherapy with Imfinzi status post 7 cycles. The patient continues to tolerate this treatment well with no concerning adverse effects. I recommended for her to proceed with cycle #8 today. I will see her back for follow-up visit in 2 weeks for evaluation before starting cycle #9. The patient was advised to call immediately if she has any concerning symptoms in the interval. The patient voices understanding of current disease status and treatment options and is in agreement with the current care plan.  All questions were answered. The patient knows to call the clinic with any problems, questions or concerns. We can certainly see the patient much sooner if necessary.  Disclaimer: This note was dictated with voice recognition software. Similar sounding words can inadvertently be transcribed and may not be corrected upon review.

## 2018-11-05 NOTE — Patient Instructions (Signed)
[facility] Discharge Instructions for Patients Receiving Chemotherapy  Today you received the following chemotherapy agents: Durvalumab (Imfinzi)   To help prevent nausea and vomiting after your treatment, we encourage you to take your nausea medication as directed.   If you develop nausea and vomiting that is not controlled by your nausea medication, call the clinic.   BELOW ARE SYMPTOMS THAT SHOULD BE REPORTED IMMEDIATELY:  *FEVER GREATER THAN 100.5 F  *CHILLS WITH OR WITHOUT FEVER  NAUSEA AND VOMITING THAT IS NOT CONTROLLED WITH YOUR NAUSEA MEDICATION  *UNUSUAL SHORTNESS OF BREATH  *UNUSUAL BRUISING OR BLEEDING  TENDERNESS IN MOUTH AND THROAT WITH OR WITHOUT PRESENCE OF ULCERS  *URINARY PROBLEMS  *BOWEL PROBLEMS  UNUSUAL RASH Items with * indicate a potential emergency and should be followed up as soon as possible.  Feel free to call the clinic should you have any questions or concerns. The clinic phone number is [phone].  Please show the CHEMO ALERT CARD at check-in to the Emergency Department and triage nurse.   

## 2018-11-19 ENCOUNTER — Telehealth: Payer: Self-pay | Admitting: Internal Medicine

## 2018-11-19 ENCOUNTER — Inpatient Hospital Stay: Payer: BLUE CROSS/BLUE SHIELD

## 2018-11-19 ENCOUNTER — Inpatient Hospital Stay: Payer: BLUE CROSS/BLUE SHIELD | Attending: Internal Medicine

## 2018-11-19 ENCOUNTER — Encounter: Payer: Self-pay | Admitting: Internal Medicine

## 2018-11-19 ENCOUNTER — Inpatient Hospital Stay (HOSPITAL_BASED_OUTPATIENT_CLINIC_OR_DEPARTMENT_OTHER): Payer: BLUE CROSS/BLUE SHIELD | Admitting: Internal Medicine

## 2018-11-19 ENCOUNTER — Other Ambulatory Visit: Payer: Self-pay

## 2018-11-19 VITALS — BP 102/74 | HR 113 | Temp 97.7°F | Resp 18 | Ht 62.0 in | Wt 236.7 lb

## 2018-11-19 VITALS — HR 88

## 2018-11-19 DIAGNOSIS — Z5112 Encounter for antineoplastic immunotherapy: Secondary | ICD-10-CM | POA: Insufficient documentation

## 2018-11-19 DIAGNOSIS — C3432 Malignant neoplasm of lower lobe, left bronchus or lung: Secondary | ICD-10-CM

## 2018-11-19 DIAGNOSIS — Z79899 Other long term (current) drug therapy: Secondary | ICD-10-CM | POA: Insufficient documentation

## 2018-11-19 LAB — CMP (CANCER CENTER ONLY)
ALT: 10 U/L (ref 0–44)
AST: 13 U/L — ABNORMAL LOW (ref 15–41)
Albumin: 3.4 g/dL — ABNORMAL LOW (ref 3.5–5.0)
Alkaline Phosphatase: 92 U/L (ref 38–126)
Anion gap: 14 (ref 5–15)
BUN: 19 mg/dL (ref 6–20)
CO2: 27 mmol/L (ref 22–32)
Calcium: 9.2 mg/dL (ref 8.9–10.3)
Chloride: 100 mmol/L (ref 98–111)
Creatinine: 1.01 mg/dL — ABNORMAL HIGH (ref 0.44–1.00)
GFR, Est AFR Am: >60 mL/min (ref >60)
GFR, Estimated: >60 mL/min (ref >60)
Glucose, Bld: 119 mg/dL — ABNORMAL HIGH (ref 70–99)
Potassium: 3.7 mmol/L (ref 3.5–5.1)
Sodium: 141 mmol/L (ref 135–145)
Total Bilirubin: 0.2 mg/dL — ABNORMAL LOW (ref 0.3–1.2)
Total Protein: 8.1 g/dL (ref 6.5–8.1)

## 2018-11-19 LAB — CBC WITH DIFFERENTIAL (CANCER CENTER ONLY)
Abs Immature Granulocytes: 0.03 10*3/uL (ref 0.00–0.07)
Basophils Absolute: 0 10*3/uL (ref 0.0–0.1)
Basophils Relative: 1 %
Eosinophils Absolute: 0.2 10*3/uL (ref 0.0–0.5)
Eosinophils Relative: 2 %
HCT: 42.6 % (ref 36.0–46.0)
Hemoglobin: 13.1 g/dL (ref 12.0–15.0)
Immature Granulocytes: 0 %
Lymphocytes Relative: 12 %
Lymphs Abs: 0.9 10*3/uL (ref 0.7–4.0)
MCH: 28.8 pg (ref 26.0–34.0)
MCHC: 30.8 g/dL (ref 30.0–36.0)
MCV: 93.6 fL (ref 80.0–100.0)
Monocytes Absolute: 0.6 10*3/uL (ref 0.1–1.0)
Monocytes Relative: 7 %
Neutro Abs: 6.1 10*3/uL (ref 1.7–7.7)
Neutrophils Relative %: 78 %
Platelet Count: 274 10*3/uL (ref 150–400)
RBC: 4.55 MIL/uL (ref 3.87–5.11)
RDW: 14.3 % (ref 11.5–15.5)
WBC Count: 7.8 10*3/uL (ref 4.0–10.5)
nRBC: 0 % (ref 0.0–0.2)

## 2018-11-19 LAB — TSH: TSH: 3.868 u[IU]/mL (ref 0.308–3.960)

## 2018-11-19 MED ORDER — SODIUM CHLORIDE 0.9 % IV SOLN
Freq: Once | INTRAVENOUS | Status: AC
  Administered 2018-11-19: 10:00:00 via INTRAVENOUS
  Filled 2018-11-19: qty 250

## 2018-11-19 MED ORDER — SODIUM CHLORIDE 0.9 % IV SOLN
9.8000 mg/kg | Freq: Once | INTRAVENOUS | Status: AC
  Administered 2018-11-19: 10:00:00 1000 mg via INTRAVENOUS
  Filled 2018-11-19: qty 20

## 2018-11-19 NOTE — Progress Notes (Signed)
[place] [phone]   [phone]  OFFICE PROGRESS NOTE  [name], MD [place] [address]  DIAGNOSIS: stage IIIB (T3, N2, M0) non-small cell lung cancer, squamous cell carcinoma presented with large left lower lobe lung mass in addition to mediastinal lymphadenopathy diagnosed in October 2019. PDL 1 expression is negative  PRIOR THERAPY: Concurrent chemoradiation with weekly carboplatin for AUC of 2 and paclitaxel 45 mg/M2.  Status post 7 cycles with partial response.Marland Kitchen  CURRENT THERAPY: Consolidation treatment with immunotherapy with Imfinzi 10 mg/KG every 2 weeks.  First dose July 31, 2018.  Status post 8 cycles.  INTERVAL HISTORY: Deborah [name] 63 y.o. female returns to the clinic today for follow-up visit.  The patient is feeling fine today with no concerning complaints.  She continues to tolerate her treatment with Imfinzi fairly well.  She denied having any chest pain, but continues to have shortness of breath at baseline increased with exertion.  She denied having any cough or hemoptysis.  She has no fever or chills.  The patient denied having any headache or visual changes.  She has no nausea, vomiting, diarrhea or constipation.  She is here today for evaluation before starting cycle #9.  MEDICAL HISTORY: Past Medical History:  Diagnosis Date  . Anemia    as teen  . Asthma   . CHF (congestive heart failure) ([place])   . COPD, mild ([place])   . Depression   . Diverticulitis   . Family history of anesthesia complication    vomiting  . GI bleeding   . Heart failure ([place])    New onset 07/25/14  . Histoplasmosis    left eye  . Hyperkalemia   . Hypertension   . Obesity (BMI 30-39.9)   . Pneumonia    dx wtih pneumonia on 05/27/16- seen by [facility]   . PONV (postoperative nausea and vomiting)   . Restrictive lung disease   . Shortness of breath    with exertion   . Sleep apnea    mask and oxygen at  nite for sleep at 2L   . Tobacco abuse   . Umbilical hernia     ALLERGIES:  has No Known Allergies.  MEDICATIONS:  Current Outpatient Medications  Medication Sig Dispense Refill  . acetaminophen (TYLENOL) 500 MG tablet Take 1,500 mg by mouth 2 (two) times daily as needed for moderate pain or headache.    . albuterol (PROVENTIL) (2.5 MG/3ML) 0.083% nebulizer solution USE 1 VIAL VIA NEBULIZER EVERY 3 HOURS AS NEEDED FOR WHEEZING OR SHORTNESS OF BREATH (Patient taking differently: Take 2.5 mg by nebulization every 3 (three) hours as needed for wheezing or shortness of breath. ) 1125 mL 1  . aspirin 81 MG chewable tablet Chew 1 tablet (81 mg total) by mouth daily. 30 tablet 1  . Cholecalciferol (D3 ADULT PO) Take 1,000 mg by mouth.    . diltiazem (CARDIZEM CD) 120 MG 24 hr capsule TAKE 1 CAPSULE BY MOUTH EVERY DAY 90 capsule 1  . furosemide (LASIX) 20 MG tablet TAKE 2 TABLETS EACH MORNING AND 1 TAB AT 3 PM 270 tablet 0  . ipratropium-albuterol (DUONEB) 0.5-2.5 (3) MG/3ML SOLN USE 1 VIAL VIA NEBULIZER EVERY 6 HOURS AS NEEDED (Patient taking differently: Inhale 3 mLs into the lungs every 6 (six) hours as needed (shortness of breath or wheezing). ) 360 mL 2  . montelukast (SINGULAIR) 10 MG tablet TAKE 1 TABLET(10 MG) BY  MOUTH AT BEDTIME 90 tablet 1  . OXYGEN Inhale 4 L/min into the lungs continuous.     . potassium chloride SA (KLOR-CON M20) 20 MEQ tablet TAKE 1/2 TABLET BY MOUTH DAILY 45 tablet 1  . prochlorperazine (COMPAZINE) 10 MG tablet TAKE 1 TABLET BY MOUTH EVERY 6 HOURS AS NEEDED FOR NAUSEA OR VOMITING. (Patient not taking: Reported on 10/11/2018) 30 tablet 0  . Spacer/Aero-Holding Chambers (AEROCHAMBER MV) inhaler Use as instructed 1 each 0  . VENTOLIN HFA 108 (90 Base) MCG/ACT inhaler INHALE 2 PUFFS BY MOUTH EVERY 6 HOURS AS NEEDED FOR WHEEZING OR SHORTNESS OF BREATH 18 g 5  . vitamin B-12 (CYANOCOBALAMIN) 500 MCG tablet Take 500 mcg by mouth daily.     No current facility-administered  medications for this visit.     SURGICAL HISTORY:  Past Surgical History:  Procedure Laterality Date  . APPLICATION OF WOUND VAC  03/19/2013   Procedure: APPLICATION OF WOUND VAC;  Surgeon: Madilyn Hook, DO;  Location: [facility];  Service: General;;  . CESAREAN SECTION  04/12/85  . COLONOSCOPY WITH PROPOFOL N/A 06/13/2016   Procedure: COLONOSCOPY WITH PROPOFOL;  Surgeon: Jerene Bears, MD;  Location: [facility];  Service: Gastroenterology;  Laterality: N/A;  . ERCP N/A 02/15/2017   Procedure: ENDOSCOPIC RETROGRADE CHOLANGIOPANCREATOGRAPHY (ERCP);  Surgeon: [name], MD;  Location: [facility];  Service: Endoscopy;  Laterality: N/A;  . INSERTION OF MESH N/A 03/19/2013   Procedure: INSERTION OF MESH;  Surgeon: Madilyn Hook, DO;  Location: [facility];  Service: General;  Laterality: N/A;  . VENTRAL HERNIA REPAIR N/A 03/19/2013   Procedure:  OPEN VENTRAL HERNIA REPAIR WITH MESH AND APPLICATION OF WOUND VAC;  Surgeon: Madilyn Hook, DO;  Location: [facility];  Service: General;  Laterality: N/A;  . VIDEO BRONCHOSCOPY Bilateral 04/19/2018   Procedure: VIDEO BRONCHOSCOPY WITH FLUORO;  Surgeon: Rigoberto Noel, MD;  Location: [facility];  Service: Cardiopulmonary;  Laterality: Bilateral;    REVIEW OF SYSTEMS:  A comprehensive review of systems was negative except for: Respiratory: positive for dyspnea on exertion   PHYSICAL EXAMINATION: General appearance: alert, cooperative and no distress Head: Normocephalic, without obvious abnormality, atraumatic Neck: no adenopathy, no JVD, supple, symmetrical, trachea midline and thyroid not enlarged, symmetric, no tenderness/mass/nodules Lymph nodes: Cervical, supraclavicular, and axillary nodes normal. Resp: clear to auscultation bilaterally Back: symmetric, no curvature. ROM normal. No CVA tenderness. Cardio: regular rate and rhythm, S1, S2 normal, no murmur, click, rub or gallop GI: soft, non-tender; bowel sounds normal; no masses,  no organomegaly Extremities:  extremities normal, atraumatic, no cyanosis or edema  ECOG PERFORMANCE STATUS: 1 - Symptomatic but completely ambulatory  Blood pressure 102/74, pulse (!) 113, temperature 97.7 F (36.5 C), temperature source Oral, resp. rate 18, height 5\' 2"  (1.575 m), weight 236 lb 11.2 oz (107.4 kg), SpO2 95 %.  LABORATORY DATA: Lab Results  Component Value Date   WBC 7.8 11/19/2018   HGB 13.1 11/19/2018   HCT 42.6 11/19/2018   MCV 93.6 11/19/2018   PLT 274 11/19/2018      Chemistry      Component Value Date/Time   NA 141 11/05/2018 0743   NA 143 11/23/2016 1504   K 3.8 11/05/2018 0743   CL 100 11/05/2018 0743   CO2 26 11/05/2018 0743   BUN 20 11/05/2018 0743   BUN 14 11/23/2016 1504   CREATININE 0.97 11/05/2018 0743      Component Value Date/Time   CALCIUM 8.8 (L) 11/05/2018 3220  ALKPHOS 93 11/05/2018 0743   AST 14 (L) 11/05/2018 0743   ALT 14 11/05/2018 0743   BILITOT 0.3 11/05/2018 0743       RADIOGRAPHIC STUDIES: Ct Chest W Contrast  Result Date: 10/21/2018 CLINICAL DATA:  Lung cancer. EXAM: CT CHEST WITH CONTRAST TECHNIQUE: Multidetector CT imaging of the chest was performed during intravenous contrast administration. CONTRAST:  28mL OMNIPAQUE IOHEXOL 300 MG/ML  SOLN COMPARISON:  07/19/2018 FINDINGS: Cardiovascular: The heart size is normal. No substantial pericardial effusion. Coronary artery calcification is evident. Atherosclerotic calcification is noted in the wall of the thoracic aorta. Mediastinum/Nodes: No mediastinal lymphadenopathy. There is no hilar lymphadenopathy. The esophagus has normal imaging features. There is no axillary lymphadenopathy. Lungs/Pleura: The central tracheobronchial airways are patent. Centrilobular and paraseptal emphysema evident. Calcified granulomata again noted at the right posterior lung base. Small subpleural nodule posterior right costophrenic sulcus (121/7) likely atelectatic. The cystic area identified previously in the left lower lobe is  again noted. The surrounding ground-glass attenuation persists with interval development of a 10 x 5 mm adjacent nodular opacity (58/7). Patchy airspace disease noted anterior left upper lobe (58/7) with a new 6 mm left upper lobe ground-glass nodule visible on 77/7. Patchy airspace opacity posterior left lower lobe seen on the previous study has improved but not resolved (see 97/7). No pleural effusion. Upper Abdomen: The gallbladder wall thickening and enhancement seen on the previous study has decreased in the interval. There is some persistent soft tissue thickening in the region of the gallbladder fundus but appears tethered to the hepatic flexure. Musculoskeletal: No worrisome lytic or sclerotic osseous abnormality. IMPRESSION: 1. Interval development of new patchy airspace opacity in the anterior left upper lobe with new 10 x 5 mm nodule adjacent to the previously described left lower lobe cystic lesion. These changes may reflect sequelae of prior radiation and/or infectious/inflammatory etiology. Close attention on follow-up recommended to exclude recurrent disease. 2. Interval decrease in wall thickening and enhancement of the gallbladder although there is persistent abnormal soft tissue attenuation in the region of the gallbladder fundus, tracking to the hepatic flexure. Interval improvement is reassuring, but continued close attention recommended. 3.  Emphysema. (ICD10-J43.9) 4.  Aortic Atherosclerois (ICD10-170.0) Electronically Signed   By: Misty Stanley M.D.   On: 10/21/2018 08:09    ASSESSMENT AND PLAN: This is a very pleasant 63 years old white female with stage IIIb non-small cell lung cancer, squamous cell carcinoma. She underwent a course of concurrent chemoradiation with weekly carboplatin and paclitaxel, status post 7 cycles.  She tolerated this treatment well with no concerning adverse effect except for fatigue. She is currently undergoing consolidation treatment with immunotherapy with  Imfinzi status post 8 cycles. The patient continues to tolerate this treatment well with no concerning adverse effects. I recommended for her to proceed with cycle #9 today as scheduled. I will see her back for follow-up visit in 2 weeks for evaluation before starting the next cycle of her treatment. She was advised to call immediately if she has any concerning symptoms in the interval. The patient voices understanding of current disease status and treatment options and is in agreement with the current care plan.  All questions were answered. The patient knows to call the clinic with any problems, questions or concerns. We can certainly see the patient much sooner if necessary.  Disclaimer: This note was dictated with voice recognition software. Similar sounding words can inadvertently be transcribed and may not be corrected upon review.

## 2018-11-19 NOTE — Telephone Encounter (Signed)
Scheduled appt per 5/5 los - pt to get an updated schedule next visit. - added additional cycles.

## 2018-11-19 NOTE — Patient Instructions (Signed)
Coronavirus (COVID-19) Are you at risk?  Are you at risk for the Coronavirus (COVID-19)?  To be considered HIGH RISK for Coronavirus (COVID-19), you have to meet the following criteria:  . Traveled to [place], [place], [place], [place] or [place]; or in the [place] to [place], [place], [place], or [place]; and have fever, cough, and shortness of breath within the last 2 weeks of travel OR . Been in close contact with a person diagnosed with COVID-19 within the last 2 weeks and have fever, cough, and shortness of breath . IF YOU DO NOT MEET THESE CRITERIA, YOU ARE CONSIDERED LOW RISK FOR COVID-19.  What to do if you are HIGH RISK for COVID-19?  . If you are having a medical emergency, call 911. . Seek medical care right away. Before you go to a doctor's office, urgent care or emergency department, call ahead and tell them about your recent travel, contact with someone diagnosed with COVID-19, and your symptoms. You should receive instructions from your physician's office regarding next steps of care.  . When you arrive at healthcare provider, tell the healthcare staff immediately you have returned from visiting [place], [place], [place], [place] or [place]; or traveled in the [place] to [place], [place], [place], or [place]; in the last two weeks or you have been in close contact with a person diagnosed with COVID-19 in the last 2 weeks.   . Tell the health care staff about your symptoms: fever, cough and shortness of breath. . After you have been seen by a medical provider, you will be either: o Tested for (COVID-19) and discharged home on quarantine except to seek medical care if symptoms worsen, and asked to  - Stay home and avoid contact with others until you get your results (4-5 days)  - Avoid travel on public transportation if possible (such as bus, train, or airplane) or o Sent to the Emergency Department by EMS for evaluation, COVID-19 testing, and possible  admission depending on your condition and test results.  What to do if you are LOW RISK for COVID-19?  Reduce your risk of any infection by using the same precautions used for avoiding the common cold or flu:  . Wash your hands often with soap and warm water for at least 20 seconds.  If soap and water are not readily available, use an alcohol-based hand sanitizer with at least 60% alcohol.  . If coughing or sneezing, cover your mouth and nose by coughing or sneezing into the elbow areas of your shirt or coat, into a tissue or into your sleeve (not your hands). . Avoid shaking hands with others and consider head nods or verbal greetings only. . Avoid touching your eyes, nose, or mouth with unwashed hands.  . Avoid close contact with people who are sick. . Avoid places or events with large numbers of people in one location, like concerts or sporting events. . Carefully consider travel plans you have or are making. . If you are planning any travel outside or inside the [place], visit the CDC's Travelers' Health webpage for the latest health notices. . If you have some symptoms but not all symptoms, continue to monitor at home and seek medical attention if your symptoms worsen. . If you are having a medical emergency, call 911.   ADDITIONAL HEALTHCARE OPTIONS FOR PATIENTS  [facility] / e-Visit: [email or web address]         [facility]: [phone]  [place]   Urgent [facility]: [facility]: [place] Discharge Instructions for Patients Receiving Chemotherapy  Today you received the following chemotherapy agents Imfinzi  To help prevent nausea and vomiting after your treatment, we encourage you to take your nausea medication as directed.    If you develop nausea and vomiting that is not controlled by your nausea medication, call the clinic.   BELOW ARE  SYMPTOMS THAT SHOULD BE REPORTED IMMEDIATELY:  *FEVER GREATER THAN 100.5 F  *CHILLS WITH OR WITHOUT FEVER  NAUSEA AND VOMITING THAT IS NOT CONTROLLED WITH YOUR NAUSEA MEDICATION  *UNUSUAL SHORTNESS OF BREATH  *UNUSUAL BRUISING OR BLEEDING  TENDERNESS IN MOUTH AND THROAT WITH OR WITHOUT PRESENCE OF ULCERS  *URINARY PROBLEMS  *BOWEL PROBLEMS  UNUSUAL RASH Items with * indicate a potential emergency and should be followed up as soon as possible.  Feel free to call the clinic should you have any questions or concerns. The clinic phone number is [phone].  Please show the [place] at check-in to the Emergency Department and triage nurse.

## 2018-12-03 ENCOUNTER — Inpatient Hospital Stay: Payer: BLUE CROSS/BLUE SHIELD

## 2018-12-03 ENCOUNTER — Other Ambulatory Visit: Payer: Self-pay

## 2018-12-03 ENCOUNTER — Inpatient Hospital Stay (HOSPITAL_BASED_OUTPATIENT_CLINIC_OR_DEPARTMENT_OTHER): Payer: BLUE CROSS/BLUE SHIELD | Admitting: Internal Medicine

## 2018-12-03 ENCOUNTER — Encounter: Payer: Self-pay | Admitting: Internal Medicine

## 2018-12-03 VITALS — HR 94

## 2018-12-03 VITALS — BP 126/87 | HR 112 | Temp 99.1°F | Resp 18 | Ht 62.0 in | Wt 238.5 lb

## 2018-12-03 DIAGNOSIS — Z79899 Other long term (current) drug therapy: Secondary | ICD-10-CM | POA: Diagnosis not present

## 2018-12-03 DIAGNOSIS — C3432 Malignant neoplasm of lower lobe, left bronchus or lung: Secondary | ICD-10-CM | POA: Diagnosis not present

## 2018-12-03 DIAGNOSIS — Z5112 Encounter for antineoplastic immunotherapy: Secondary | ICD-10-CM

## 2018-12-03 DIAGNOSIS — Z9981 Dependence on supplemental oxygen: Secondary | ICD-10-CM | POA: Diagnosis not present

## 2018-12-03 LAB — CMP (CANCER CENTER ONLY)
ALT: 12 U/L (ref 0–44)
AST: 13 U/L — ABNORMAL LOW (ref 15–41)
Albumin: 3.3 g/dL — ABNORMAL LOW (ref 3.5–5.0)
Alkaline Phosphatase: 97 U/L (ref 38–126)
Anion gap: 13 (ref 5–15)
BUN: 16 mg/dL (ref 6–20)
CO2: 28 mmol/L (ref 22–32)
Calcium: 9.4 mg/dL (ref 8.9–10.3)
Chloride: 100 mmol/L (ref 98–111)
Creatinine: 1 mg/dL (ref 0.44–1.00)
GFR, Est AFR Am: >60 mL/min (ref >60)
GFR, Estimated: >60 mL/min (ref >60)
Glucose, Bld: 96 mg/dL (ref 70–99)
Potassium: 3.9 mmol/L (ref 3.5–5.1)
Sodium: 141 mmol/L (ref 135–145)
Total Bilirubin: 0.2 mg/dL — ABNORMAL LOW (ref 0.3–1.2)
Total Protein: 7.9 g/dL (ref 6.5–8.1)

## 2018-12-03 LAB — CBC WITH DIFFERENTIAL (CANCER CENTER ONLY)
Abs Immature Granulocytes: 0.02 10*3/uL (ref 0.00–0.07)
Basophils Absolute: 0 10*3/uL (ref 0.0–0.1)
Basophils Relative: 0 %
Eosinophils Absolute: 0.2 10*3/uL (ref 0.0–0.5)
Eosinophils Relative: 2 %
HCT: 43.1 % (ref 36.0–46.0)
Hemoglobin: 13.1 g/dL (ref 12.0–15.0)
Immature Granulocytes: 0 %
Lymphocytes Relative: 11 %
Lymphs Abs: 1 10*3/uL (ref 0.7–4.0)
MCH: 28.3 pg (ref 26.0–34.0)
MCHC: 30.4 g/dL (ref 30.0–36.0)
MCV: 93.1 fL (ref 80.0–100.0)
Monocytes Absolute: 0.6 10*3/uL (ref 0.1–1.0)
Monocytes Relative: 7 %
Neutro Abs: 7.6 10*3/uL (ref 1.7–7.7)
Neutrophils Relative %: 80 %
Platelet Count: 267 10*3/uL (ref 150–400)
RBC: 4.63 MIL/uL (ref 3.87–5.11)
RDW: 14.5 % (ref 11.5–15.5)
WBC Count: 9.4 10*3/uL (ref 4.0–10.5)
nRBC: 0 % (ref 0.0–0.2)

## 2018-12-03 LAB — TSH: TSH: 2.376 u[IU]/mL (ref 0.308–3.960)

## 2018-12-03 MED ORDER — SODIUM CHLORIDE 0.9 % IV SOLN
Freq: Once | INTRAVENOUS | Status: AC
  Administered 2018-12-03: 10:00:00 via INTRAVENOUS
  Filled 2018-12-03: qty 250

## 2018-12-03 MED ORDER — SODIUM CHLORIDE 0.9 % IV SOLN
9.8000 mg/kg | Freq: Once | INTRAVENOUS | Status: AC
  Administered 2018-12-03: 1000 mg via INTRAVENOUS
  Filled 2018-12-03: qty 20

## 2018-12-03 NOTE — Progress Notes (Signed)
[facility] [phone]   [phone]  OFFICE PROGRESS NOTE  [name], MD [place] [address]  DIAGNOSIS: stage IIIB (T3, N2, M0) non-small cell lung cancer, squamous cell carcinoma presented with large left lower lobe lung mass in addition to mediastinal lymphadenopathy diagnosed in October 2019. PDL 1 expression is negative  PRIOR THERAPY: Concurrent chemoradiation with weekly carboplatin for AUC of 2 and paclitaxel 45 mg/M2.  Status post 7 cycles with partial response.Marland Kitchen  CURRENT THERAPY: Consolidation treatment with immunotherapy with Imfinzi 10 mg/KG every 2 weeks.  First dose July 31, 2018.  Status post 9 cycles.  INTERVAL HISTORY: Deborah [name] 63 y.o. female returns to the clinic today for follow-up visit.  The patient is feeling fine today with no concerning complaints.  She denied having any chest pain, cough or hemoptysis but she continues to have shortness of breath at baseline increased with exertion and she is currently on home oxygen.  The patient denied having any nausea, vomiting, diarrhea or constipation.  She has no headache or visual changes.  She denied having any fever or chills.  She is here today for evaluation before starting cycle #10.   MEDICAL HISTORY: Past Medical History:  Diagnosis Date  . Anemia    as teen  . Asthma   . CHF (congestive heart failure) ([place])   . COPD, mild ([place])   . Depression   . Diverticulitis   . Family history of anesthesia complication    vomiting  . GI bleeding   . Heart failure ([place])    New onset 07/25/14  . Histoplasmosis    left eye  . Hyperkalemia   . Hypertension   . Obesity (BMI 30-39.9)   . Pneumonia    dx wtih pneumonia on 05/27/16- seen by [facility]   . PONV (postoperative nausea and vomiting)   . Restrictive lung disease   . Shortness of breath    with exertion   . Sleep apnea    mask and oxygen at nite for sleep at 2L   . Tobacco abuse    . Umbilical hernia     ALLERGIES:  has No Known Allergies.  MEDICATIONS:  Current Outpatient Medications  Medication Sig Dispense Refill  . acetaminophen (TYLENOL) 500 MG tablet Take 1,500 mg by mouth 2 (two) times daily as needed for moderate pain or headache.    . albuterol (PROVENTIL) (2.5 MG/3ML) 0.083% nebulizer solution USE 1 VIAL VIA NEBULIZER EVERY 3 HOURS AS NEEDED FOR WHEEZING OR SHORTNESS OF BREATH (Patient taking differently: Take 2.5 mg by nebulization every 3 (three) hours as needed for wheezing or shortness of breath. ) 1125 mL 1  . aspirin 81 MG chewable tablet Chew 1 tablet (81 mg total) by mouth daily. 30 tablet 1  . Cholecalciferol (D3 ADULT PO) Take 1,000 mg by mouth.    . diltiazem (CARDIZEM CD) 120 MG 24 hr capsule TAKE 1 CAPSULE BY MOUTH EVERY DAY 90 capsule 1  . furosemide (LASIX) 20 MG tablet TAKE 2 TABLETS EACH MORNING AND 1 TAB AT 3 PM 270 tablet 0  . ipratropium-albuterol (DUONEB) 0.5-2.5 (3) MG/3ML SOLN USE 1 VIAL VIA NEBULIZER EVERY 6 HOURS AS NEEDED (Patient taking differently: Inhale 3 mLs into the lungs every 6 (six) hours as needed (shortness of breath or wheezing). ) 360 mL 2  . montelukast (SINGULAIR) 10 MG tablet TAKE 1 TABLET(10 MG) BY MOUTH AT BEDTIME 90 tablet 1  .  OXYGEN Inhale 4 L/min into the lungs continuous.     . potassium chloride SA (KLOR-CON M20) 20 MEQ tablet TAKE 1/2 TABLET BY MOUTH DAILY 45 tablet 1  . prochlorperazine (COMPAZINE) 10 MG tablet TAKE 1 TABLET BY MOUTH EVERY 6 HOURS AS NEEDED FOR NAUSEA OR VOMITING. (Patient not taking: Reported on 10/11/2018) 30 tablet 0  . Spacer/Aero-Holding Chambers (AEROCHAMBER MV) inhaler Use as instructed 1 each 0  . VENTOLIN HFA 108 (90 Base) MCG/ACT inhaler INHALE 2 PUFFS BY MOUTH EVERY 6 HOURS AS NEEDED FOR WHEEZING OR SHORTNESS OF BREATH 18 g 5  . vitamin B-12 (CYANOCOBALAMIN) 500 MCG tablet Take 500 mcg by mouth daily.     No current facility-administered medications for this visit.     SURGICAL  HISTORY:  Past Surgical History:  Procedure Laterality Date  . APPLICATION OF WOUND VAC  03/19/2013   Procedure: APPLICATION OF WOUND VAC;  Surgeon: Madilyn Hook, DO;  Location: [facility];  Service: General;;  . CESAREAN SECTION  04/12/85  . COLONOSCOPY WITH PROPOFOL N/A 06/13/2016   Procedure: COLONOSCOPY WITH PROPOFOL;  Surgeon: Jerene Bears, MD;  Location: [facility];  Service: Gastroenterology;  Laterality: N/A;  . ERCP N/A 02/15/2017   Procedure: ENDOSCOPIC RETROGRADE CHOLANGIOPANCREATOGRAPHY (ERCP);  Surgeon: [name], MD;  Location: [facility];  Service: Endoscopy;  Laterality: N/A;  . INSERTION OF MESH N/A 03/19/2013   Procedure: INSERTION OF MESH;  Surgeon: Madilyn Hook, DO;  Location: [facility];  Service: General;  Laterality: N/A;  . VENTRAL HERNIA REPAIR N/A 03/19/2013   Procedure:  OPEN VENTRAL HERNIA REPAIR WITH MESH AND APPLICATION OF WOUND VAC;  Surgeon: Madilyn Hook, DO;  Location: [facility];  Service: General;  Laterality: N/A;  . VIDEO BRONCHOSCOPY Bilateral 04/19/2018   Procedure: VIDEO BRONCHOSCOPY WITH FLUORO;  Surgeon: Rigoberto Noel, MD;  [facility];  Service: Cardiopulmonary;  Laterality: Bilateral;    REVIEW OF SYSTEMS:  A comprehensive review of systems was negative except for: Respiratory: positive for dyspnea on exertion   PHYSICAL EXAMINATION: General appearance: alert, cooperative and no distress Head: Normocephalic, without obvious abnormality, atraumatic Neck: no adenopathy, no JVD, supple, symmetrical, trachea midline and thyroid not enlarged, symmetric, no tenderness/mass/nodules Lymph nodes: Cervical, supraclavicular, and axillary nodes normal. Resp: clear to auscultation bilaterally Back: symmetric, no curvature. ROM normal. No CVA tenderness. Cardio: regular rate and rhythm, S1, S2 normal, no murmur, click, rub or gallop GI: soft, non-tender; bowel sounds normal; no masses,  no organomegaly Extremities: extremities normal, atraumatic, no cyanosis  or edema  ECOG PERFORMANCE STATUS: 1 - Symptomatic but completely ambulatory  Blood pressure 126/87, pulse (!) 112, temperature 99.1 F (37.3 C), temperature source Oral, resp. rate 18, height 5\' 2"  (1.575 m), weight 238 lb 8 oz (108.2 kg), SpO2 98 %.  LABORATORY DATA: Lab Results  Component Value Date   WBC 9.4 12/03/2018   HGB 13.1 12/03/2018   HCT 43.1 12/03/2018   MCV 93.1 12/03/2018   PLT 267 12/03/2018      Chemistry      Component Value Date/Time   NA 141 11/19/2018 0803   NA 143 11/23/2016 1504   K 3.7 11/19/2018 0803   CL 100 11/19/2018 0803   CO2 27 11/19/2018 0803   BUN 19 11/19/2018 0803   BUN 14 11/23/2016 1504   CREATININE 1.01 (H) 11/19/2018 0803      Component Value Date/Time   CALCIUM 9.2 11/19/2018 0803   ALKPHOS 92 11/19/2018 0803   AST 13 (  L) 11/19/2018 0803   ALT 10 11/19/2018 0803   BILITOT 0.2 (L) 11/19/2018 0803       RADIOGRAPHIC STUDIES: No results found.  ASSESSMENT AND PLAN: This is a very pleasant 63 years old white female with stage IIIb non-small cell lung cancer, squamous cell carcinoma. She underwent a course of concurrent chemoradiation with weekly carboplatin and paclitaxel, status post 7 cycles.  She tolerated this treatment well with no concerning adverse effect except for fatigue. She is currently undergoing consolidation treatment with immunotherapy with Imfinzi status post 9 cycles. The patient continues to tolerate this treatment well with no concerning adverse effects. I recommended for her to proceed with cycle #3 today as planned. She will come back for follow-up visit in 2 weeks for evaluation before starting cycle #11. She was advised to call immediately if she has any concerning symptoms in the interval. The patient voices understanding of current disease status and treatment options and is in agreement with the current care plan.  All questions were answered. The patient knows to call the clinic with any problems,  questions or concerns. We can certainly see the patient much sooner if necessary.  Disclaimer: This note was dictated with voice recognition software. Similar sounding words can inadvertently be transcribed and may not be corrected upon review.

## 2018-12-03 NOTE — Patient Instructions (Signed)
Coronavirus (COVID-19) Are you at risk?  Are you at risk for the Coronavirus (COVID-19)?  To be considered HIGH RISK for Coronavirus (COVID-19), you have to meet the following criteria:  . Traveled to [place], [place], [place], [place] or [place]; or in the [place] to [place], [place], [place], or [place]; and have fever, cough, and shortness of breath within the last 2 weeks of travel OR . Been in close contact with a person diagnosed with COVID-19 within the last 2 weeks and have fever, cough, and shortness of breath . IF YOU DO NOT MEET THESE CRITERIA, YOU ARE CONSIDERED LOW RISK FOR COVID-19.  What to do if you are HIGH RISK for COVID-19?  . If you are having a medical emergency, call 911. . Seek medical care right away. Before you go to a doctor's office, urgent care or emergency department, call ahead and tell them about your recent travel, contact with someone diagnosed with COVID-19, and your symptoms. You should receive instructions from your physician's office regarding next steps of care.  . When you arrive at healthcare provider, tell the healthcare staff immediately you have returned from visiting [place], [place], [place], [place] or [place]; or traveled in the [place] to [place], [place], [place], or [place]; in the last two weeks or you have been in close contact with a person diagnosed with COVID-19 in the last 2 weeks.   . Tell the health care staff about your symptoms: fever, cough and shortness of breath. . After you have been seen by a medical provider, you will be either: o Tested for (COVID-19) and discharged home on quarantine except to seek medical care if symptoms worsen, and asked to  - Stay home and avoid contact with others until you get your results (4-5 days)  - Avoid travel on public transportation if possible (such as bus, train, or airplane) or o Sent to the Emergency Department by EMS for evaluation, COVID-19 testing, and possible  admission depending on your condition and test results.  What to do if you are LOW RISK for COVID-19?  Reduce your risk of any infection by using the same precautions used for avoiding the common cold or flu:  . Wash your hands often with soap and warm water for at least 20 seconds.  If soap and water are not readily available, use an alcohol-based hand sanitizer with at least 60% alcohol.  . If coughing or sneezing, cover your mouth and nose by coughing or sneezing into the elbow areas of your shirt or coat, into a tissue or into your sleeve (not your hands). . Avoid shaking hands with others and consider head nods or verbal greetings only. . Avoid touching your eyes, nose, or mouth with unwashed hands.  . Avoid close contact with people who are sick. . Avoid places or events with large numbers of people in one location, like concerts or sporting events. . Carefully consider travel plans you have or are making. . If you are planning any travel outside or inside the [place], visit the CDC's Travelers' Health webpage for the latest health notices. . If you have some symptoms but not all symptoms, continue to monitor at home and seek medical attention if your symptoms worsen. . If you are having a medical emergency, call 911.   ADDITIONAL HEALTHCARE OPTIONS FOR PATIENTS  [facility] / e-Visit: [email or web address]         [facility]: [phone]  [facility]: [facility]: [facility] Discharge Instructions for Patients Receiving Chemotherapy  Today you received the following chemotherapy agents: Imfinzi.  To help prevent nausea and vomiting after your treatment, we encourage you to take your nausea medication as directed.    If you develop nausea and vomiting that is not controlled by your nausea medication, call the clinic.   BELOW ARE  SYMPTOMS THAT SHOULD BE REPORTED IMMEDIATELY:  *FEVER GREATER THAN 100.5 F  *CHILLS WITH OR WITHOUT FEVER  NAUSEA AND VOMITING THAT IS NOT CONTROLLED WITH YOUR NAUSEA MEDICATION  *UNUSUAL SHORTNESS OF BREATH  *UNUSUAL BRUISING OR BLEEDING  TENDERNESS IN MOUTH AND THROAT WITH OR WITHOUT PRESENCE OF ULCERS  *URINARY PROBLEMS  *BOWEL PROBLEMS  UNUSUAL RASH Items with * indicate a potential emergency and should be followed up as soon as possible.  Feel free to call the clinic should you have any questions or concerns. The clinic phone number is [phone].  Please show the [facility] at check-in to the Emergency Department and triage nurse.

## 2018-12-17 ENCOUNTER — Encounter: Payer: Self-pay | Admitting: Internal Medicine

## 2018-12-17 ENCOUNTER — Inpatient Hospital Stay: Payer: BLUE CROSS/BLUE SHIELD | Attending: Internal Medicine

## 2018-12-17 ENCOUNTER — Other Ambulatory Visit: Payer: Self-pay

## 2018-12-17 ENCOUNTER — Inpatient Hospital Stay: Payer: BLUE CROSS/BLUE SHIELD

## 2018-12-17 ENCOUNTER — Inpatient Hospital Stay (HOSPITAL_BASED_OUTPATIENT_CLINIC_OR_DEPARTMENT_OTHER): Payer: BLUE CROSS/BLUE SHIELD | Admitting: Internal Medicine

## 2018-12-17 VITALS — BP 112/90 | HR 94 | Temp 98.7°F | Resp 18 | Ht 62.0 in | Wt 241.0 lb

## 2018-12-17 DIAGNOSIS — R000 Tachycardia, unspecified: Secondary | ICD-10-CM | POA: Insufficient documentation

## 2018-12-17 DIAGNOSIS — C3432 Malignant neoplasm of lower lobe, left bronchus or lung: Secondary | ICD-10-CM

## 2018-12-17 DIAGNOSIS — Z923 Personal history of irradiation: Secondary | ICD-10-CM

## 2018-12-17 DIAGNOSIS — Z5112 Encounter for antineoplastic immunotherapy: Secondary | ICD-10-CM | POA: Diagnosis not present

## 2018-12-17 DIAGNOSIS — Z9221 Personal history of antineoplastic chemotherapy: Secondary | ICD-10-CM | POA: Insufficient documentation

## 2018-12-17 DIAGNOSIS — Z79899 Other long term (current) drug therapy: Secondary | ICD-10-CM | POA: Insufficient documentation

## 2018-12-17 LAB — CMP (CANCER CENTER ONLY)
ALT: 10 U/L (ref 0–44)
AST: 14 U/L — ABNORMAL LOW (ref 15–41)
Albumin: 3.2 g/dL — ABNORMAL LOW (ref 3.5–5.0)
Alkaline Phosphatase: 89 U/L (ref 38–126)
Anion gap: 14 (ref 5–15)
BUN: 17 mg/dL (ref 6–20)
CO2: 26 mmol/L (ref 22–32)
Calcium: 9.1 mg/dL (ref 8.9–10.3)
Chloride: 103 mmol/L (ref 98–111)
Creatinine: 0.9 mg/dL (ref 0.44–1.00)
GFR, Est AFR Am: >60 mL/min (ref >60)
GFR, Estimated: >60 mL/min (ref >60)
Glucose, Bld: 97 mg/dL (ref 70–99)
Potassium: 4.2 mmol/L (ref 3.5–5.1)
Sodium: 143 mmol/L (ref 135–145)
Total Bilirubin: <0.2 mg/dL — ABNORMAL LOW (ref 0.3–1.2)
Total Protein: 7.7 g/dL (ref 6.5–8.1)

## 2018-12-17 LAB — CBC WITH DIFFERENTIAL (CANCER CENTER ONLY)
Abs Immature Granulocytes: 0.03 10*3/uL (ref 0.00–0.07)
Basophils Absolute: 0.1 10*3/uL (ref 0.0–0.1)
Basophils Relative: 1 %
Eosinophils Absolute: 0.2 10*3/uL (ref 0.0–0.5)
Eosinophils Relative: 3 %
HCT: 41.7 % (ref 36.0–46.0)
Hemoglobin: 12.7 g/dL (ref 12.0–15.0)
Immature Granulocytes: 0 %
Lymphocytes Relative: 17 %
Lymphs Abs: 1.3 10*3/uL (ref 0.7–4.0)
MCH: 28.6 pg (ref 26.0–34.0)
MCHC: 30.5 g/dL (ref 30.0–36.0)
MCV: 93.9 fL (ref 80.0–100.0)
Monocytes Absolute: 0.7 10*3/uL (ref 0.1–1.0)
Monocytes Relative: 9 %
Neutro Abs: 5.2 10*3/uL (ref 1.7–7.7)
Neutrophils Relative %: 70 %
Platelet Count: 263 10*3/uL (ref 150–400)
RBC: 4.44 MIL/uL (ref 3.87–5.11)
RDW: 14.7 % (ref 11.5–15.5)
WBC Count: 7.5 10*3/uL (ref 4.0–10.5)
nRBC: 0 % (ref 0.0–0.2)

## 2018-12-17 LAB — TSH: TSH: 3.103 u[IU]/mL (ref 0.308–3.960)

## 2018-12-17 MED ORDER — SODIUM CHLORIDE 0.9 % IV SOLN
9.8000 mg/kg | Freq: Once | INTRAVENOUS | Status: AC
  Administered 2018-12-17: 1000 mg via INTRAVENOUS
  Filled 2018-12-17: qty 20

## 2018-12-17 MED ORDER — SODIUM CHLORIDE 0.9 % IV SOLN
Freq: Once | INTRAVENOUS | Status: AC
  Administered 2018-12-17: 10:00:00 via INTRAVENOUS
  Filled 2018-12-17: qty 250

## 2018-12-17 NOTE — Patient Instructions (Signed)
[facility] Discharge Instructions for Patients Receiving Chemotherapy  Today you received the following chemotherapy agents: Imfinzi.  To help prevent nausea and vomiting after your treatment, we encourage you to take your nausea medication as directed.   If you develop nausea and vomiting that is not controlled by your nausea medication, call the clinic.   BELOW ARE SYMPTOMS THAT SHOULD BE REPORTED IMMEDIATELY:  *FEVER GREATER THAN 100.5 F  *CHILLS WITH OR WITHOUT FEVER  NAUSEA AND VOMITING THAT IS NOT CONTROLLED WITH YOUR NAUSEA MEDICATION  *UNUSUAL SHORTNESS OF BREATH  *UNUSUAL BRUISING OR BLEEDING  TENDERNESS IN MOUTH AND THROAT WITH OR WITHOUT PRESENCE OF ULCERS  *URINARY PROBLEMS  *BOWEL PROBLEMS  UNUSUAL RASH Items with * indicate a potential emergency and should be followed up as soon as possible.  Feel free to call the clinic should you have any questions or concerns. The clinic phone number is [phone].  Please show the CHEMO ALERT CARD at check-in to the Emergency Department and triage nurse.   

## 2018-12-17 NOTE — Progress Notes (Signed)
[facility] [phone]   Fax:(336) [phone]  OFFICE PROGRESS NOTE  [name], MD [place] [address]  DIAGNOSIS: stage IIIB (T3, N2, M0) non-small cell lung cancer, squamous cell carcinoma presented with large left lower lobe lung mass in addition to mediastinal lymphadenopathy diagnosed in October 2019. PDL 1 expression is negative  PRIOR THERAPY: Concurrent chemoradiation with weekly carboplatin for AUC of 2 and paclitaxel 45 mg/M2.  Status post 7 cycles with partial response.Marland Kitchen  CURRENT THERAPY: Consolidation treatment with immunotherapy with Imfinzi 10 mg/KG every 2 weeks.  First dose July 31, 2018.  Status post 10 cycles.  INTERVAL HISTORY: Deborah [name] 63 y.o. female returns to the clinic today for follow-up visit.  The patient is feeling fine today with no concerning complaints except for the baseline shortness of breath and she is on home oxygen.  She denied having any chest pain, cough or hemoptysis.  She denied having any recent weight loss or night sweats.  She has no nausea, vomiting, diarrhea or constipation.  She has dry skin from treatment with immunotherapy.  The patient is here today for evaluation before starting cycle #11.    MEDICAL HISTORY: Past Medical History:  Diagnosis Date  . Anemia    as teen  . Asthma   . CHF (congestive heart failure) ([place])   . COPD, mild ([place])   . Depression   . Diverticulitis   . Family history of anesthesia complication    vomiting  . GI bleeding   . Heart failure ([place])    New onset 07/25/14  . Histoplasmosis    left eye  . Hyperkalemia   . Hypertension   . Obesity (BMI 30-39.9)   . Pneumonia    dx wtih pneumonia on 05/27/16- seen by [facility]   . PONV (postoperative nausea and vomiting)   . Restrictive lung disease   . Shortness of breath    with exertion   . Sleep apnea    mask and oxygen at nite for sleep at 2L   . Tobacco abuse   . Umbilical hernia      ALLERGIES:  has No Known Allergies.  MEDICATIONS:  Current Outpatient Medications  Medication Sig Dispense Refill  . acetaminophen (TYLENOL) 500 MG tablet Take 1,500 mg by mouth 2 (two) times daily as needed for moderate pain or headache.    . albuterol (PROVENTIL) (2.5 MG/3ML) 0.083% nebulizer solution USE 1 VIAL VIA NEBULIZER EVERY 3 HOURS AS NEEDED FOR WHEEZING OR SHORTNESS OF BREATH (Patient taking differently: Take 2.5 mg by nebulization every 3 (three) hours as needed for wheezing or shortness of breath. ) 1125 mL 1  . aspirin 81 MG chewable tablet Chew 1 tablet (81 mg total) by mouth daily. 30 tablet 1  . Cholecalciferol (D3 ADULT PO) Take 1,000 mg by mouth.    . diltiazem (CARDIZEM CD) 120 MG 24 hr capsule TAKE 1 CAPSULE BY MOUTH EVERY DAY 90 capsule 1  . furosemide (LASIX) 20 MG tablet TAKE 2 TABLETS EACH MORNING AND 1 TAB AT 3 PM 270 tablet 0  . ipratropium-albuterol (DUONEB) 0.5-2.5 (3) MG/3ML SOLN USE 1 VIAL VIA NEBULIZER EVERY 6 HOURS AS NEEDED (Patient taking differently: Inhale 3 mLs into the lungs every 6 (six) hours as needed (shortness of breath or wheezing). ) 360 mL 2  . montelukast (SINGULAIR) 10 MG tablet TAKE 1 TABLET(10 MG) BY MOUTH AT BEDTIME 90 tablet 1  . OXYGEN  Inhale 4 L/min into the lungs continuous.     . potassium chloride SA (KLOR-CON M20) 20 MEQ tablet TAKE 1/2 TABLET BY MOUTH DAILY 45 tablet 1  . prochlorperazine (COMPAZINE) 10 MG tablet TAKE 1 TABLET BY MOUTH EVERY 6 HOURS AS NEEDED FOR NAUSEA OR VOMITING. (Patient not taking: Reported on 10/11/2018) 30 tablet 0  . Spacer/Aero-Holding Chambers (AEROCHAMBER MV) inhaler Use as instructed 1 each 0  . VENTOLIN HFA 108 (90 Base) MCG/ACT inhaler INHALE 2 PUFFS BY MOUTH EVERY 6 HOURS AS NEEDED FOR WHEEZING OR SHORTNESS OF BREATH 18 g 5  . vitamin B-12 (CYANOCOBALAMIN) 500 MCG tablet Take 500 mcg by mouth daily.     No current facility-administered medications for this visit.     SURGICAL HISTORY:  Past  Surgical History:  Procedure Laterality Date  . APPLICATION OF WOUND VAC  03/19/2013   Procedure: APPLICATION OF WOUND VAC;  Surgeon: Madilyn Hook, DO;  Location: [facility];  Service: General;;  . CESAREAN SECTION  04/12/85  . COLONOSCOPY WITH PROPOFOL N/A 06/13/2016   Procedure: COLONOSCOPY WITH PROPOFOL;  Surgeon: Jerene Bears, MD;  [facility];  Service: Gastroenterology;  Laterality: N/A;  . ERCP N/A 02/15/2017   Procedure: ENDOSCOPIC RETROGRADE CHOLANGIOPANCREATOGRAPHY (ERCP);  Surgeon: [name], MD;  Location: [facility];  Service: Endoscopy;  Laterality: N/A;  . INSERTION OF MESH N/A 03/19/2013   Procedure: INSERTION OF MESH;  Surgeon: Madilyn Hook, DO;  [facility];  Service: General;  Laterality: N/A;  . VENTRAL HERNIA REPAIR N/A 03/19/2013   Procedure:  OPEN VENTRAL HERNIA REPAIR WITH MESH AND APPLICATION OF WOUND VAC;  Surgeon: Madilyn Hook, DO;  Location: [facility];  Service: General;  Laterality: N/A;  . VIDEO BRONCHOSCOPY Bilateral 04/19/2018   Procedure: VIDEO BRONCHOSCOPY WITH FLUORO;  Surgeon: Rigoberto Noel, MD;  [facility];  Service: Cardiopulmonary;  Laterality: Bilateral;    REVIEW OF SYSTEMS:  A comprehensive review of systems was negative except for: Respiratory: positive for dyspnea on exertion   PHYSICAL EXAMINATION: General appearance: alert, cooperative and no distress Head: Normocephalic, without obvious abnormality, atraumatic Neck: no adenopathy, no JVD, supple, symmetrical, trachea midline and thyroid not enlarged, symmetric, no tenderness/mass/nodules Lymph nodes: Cervical, supraclavicular, and axillary nodes normal. Resp: clear to auscultation bilaterally Back: symmetric, no curvature. ROM normal. No CVA tenderness. Cardio: regular rate and rhythm, S1, S2 normal, no murmur, click, rub or gallop GI: soft, non-tender; bowel sounds normal; no masses,  no organomegaly Extremities: extremities normal, atraumatic, no cyanosis or edema   ECOG PERFORMANCE STATUS: 1 - Symptomatic but completely ambulatory  Blood pressure 112/90, pulse 94, temperature 98.7 F (37.1 C), temperature source Oral, resp. rate 18, height 5\' 2"  (1.575 m), weight 241 lb (109.3 kg), SpO2 98 %.  LABORATORY DATA: Lab Results  Component Value Date   WBC 7.5 12/17/2018   HGB 12.7 12/17/2018   HCT 41.7 12/17/2018   MCV 93.9 12/17/2018   PLT 263 12/17/2018      Chemistry      Component Value Date/Time   NA 141 12/03/2018 0831   NA 143 11/23/2016 1504   K 3.9 12/03/2018 0831   CL 100 12/03/2018 0831   CO2 28 12/03/2018 0831   BUN 16 12/03/2018 0831   BUN 14 11/23/2016 1504   CREATININE 1.00 12/03/2018 0831      Component Value Date/Time   CALCIUM 9.4 12/03/2018 0831   ALKPHOS 97 12/03/2018 0831   AST 13 (L) 12/03/2018 0831  ALT 12 12/03/2018 0831   BILITOT 0.2 (L) 12/03/2018 0831       RADIOGRAPHIC STUDIES: No results found.  ASSESSMENT AND PLAN: This is a very pleasant 63 years old white female with stage IIIb non-small cell lung cancer, squamous cell carcinoma. She underwent a course of concurrent chemoradiation with weekly carboplatin and paclitaxel, status post 7 cycles.  She tolerated this treatment well with no concerning adverse effect except for fatigue. She is currently undergoing consolidation treatment with immunotherapy with Imfinzi status post 10 cycles. The patient continues to tolerate her treatment well with no concerning adverse effects. I recommended for her to proceed with cycle #11 today. I will see the patient back for follow-up visit in 2 weeks for evaluation before starting cycle #12. She was advised to call immediately if she has any concerning symptoms in the interval. The patient voices understanding of current disease status and treatment options and is in agreement with the current care plan.  All questions were answered. The patient knows to call the clinic with any problems, questions or concerns. We can  certainly see the patient much sooner if necessary.  Disclaimer: This note was dictated with voice recognition software. Similar sounding words can inadvertently be transcribed and may not be corrected upon review.

## 2018-12-25 ENCOUNTER — Telehealth: Payer: Self-pay

## 2018-12-25 NOTE — Telephone Encounter (Signed)

## 2018-12-31 ENCOUNTER — Inpatient Hospital Stay: Payer: BLUE CROSS/BLUE SHIELD

## 2018-12-31 ENCOUNTER — Telehealth (INDEPENDENT_AMBULATORY_CARE_PROVIDER_SITE_OTHER): Payer: BLUE CROSS/BLUE SHIELD | Admitting: Cardiovascular Disease

## 2018-12-31 ENCOUNTER — Encounter: Payer: Self-pay | Admitting: Internal Medicine

## 2018-12-31 ENCOUNTER — Inpatient Hospital Stay (HOSPITAL_BASED_OUTPATIENT_CLINIC_OR_DEPARTMENT_OTHER): Payer: BLUE CROSS/BLUE SHIELD | Admitting: Internal Medicine

## 2018-12-31 ENCOUNTER — Encounter: Payer: Self-pay | Admitting: Cardiovascular Disease

## 2018-12-31 ENCOUNTER — Other Ambulatory Visit: Payer: Self-pay

## 2018-12-31 VITALS — BP 157/79 | HR 120 | Temp 98.3°F | Resp 18 | Ht 62.0 in | Wt 242.1 lb

## 2018-12-31 VITALS — BP 188/73 | HR 73 | Ht 62.0 in | Wt 242.0 lb

## 2018-12-31 DIAGNOSIS — I5032 Chronic diastolic (congestive) heart failure: Secondary | ICD-10-CM

## 2018-12-31 DIAGNOSIS — Z79899 Other long term (current) drug therapy: Secondary | ICD-10-CM

## 2018-12-31 DIAGNOSIS — Z5112 Encounter for antineoplastic immunotherapy: Secondary | ICD-10-CM | POA: Diagnosis not present

## 2018-12-31 DIAGNOSIS — Z9221 Personal history of antineoplastic chemotherapy: Secondary | ICD-10-CM

## 2018-12-31 DIAGNOSIS — C3432 Malignant neoplasm of lower lobe, left bronchus or lung: Secondary | ICD-10-CM | POA: Diagnosis not present

## 2018-12-31 DIAGNOSIS — Z923 Personal history of irradiation: Secondary | ICD-10-CM

## 2018-12-31 DIAGNOSIS — C3490 Malignant neoplasm of unspecified part of unspecified bronchus or lung: Secondary | ICD-10-CM

## 2018-12-31 DIAGNOSIS — I10 Essential (primary) hypertension: Secondary | ICD-10-CM

## 2018-12-31 DIAGNOSIS — R000 Tachycardia, unspecified: Secondary | ICD-10-CM

## 2018-12-31 LAB — CMP (CANCER CENTER ONLY)
ALT: 14 U/L (ref 0–44)
AST: 14 U/L — ABNORMAL LOW (ref 15–41)
Albumin: 3.2 g/dL — ABNORMAL LOW (ref 3.5–5.0)
Alkaline Phosphatase: 83 U/L (ref 38–126)
Anion gap: 12 (ref 5–15)
BUN: 17 mg/dL (ref 6–20)
CO2: 29 mmol/L (ref 22–32)
Calcium: 9.6 mg/dL (ref 8.9–10.3)
Chloride: 103 mmol/L (ref 98–111)
Creatinine: 0.79 mg/dL (ref 0.44–1.00)
GFR, Est AFR Am: >60 mL/min (ref >60)
GFR, Estimated: >60 mL/min (ref >60)
Glucose, Bld: 87 mg/dL (ref 70–99)
Potassium: 3.8 mmol/L (ref 3.5–5.1)
Sodium: 144 mmol/L (ref 135–145)
Total Bilirubin: 0.2 mg/dL — ABNORMAL LOW (ref 0.3–1.2)
Total Protein: 7.5 g/dL (ref 6.5–8.1)

## 2018-12-31 LAB — CBC WITH DIFFERENTIAL (CANCER CENTER ONLY)
Abs Immature Granulocytes: 0.02 10*3/uL (ref 0.00–0.07)
Basophils Absolute: 0 10*3/uL (ref 0.0–0.1)
Basophils Relative: 0 %
Eosinophils Absolute: 0.2 10*3/uL (ref 0.0–0.5)
Eosinophils Relative: 2 %
HCT: 39.6 % (ref 36.0–46.0)
Hemoglobin: 12 g/dL (ref 12.0–15.0)
Immature Granulocytes: 0 %
Lymphocytes Relative: 11 %
Lymphs Abs: 0.8 10*3/uL (ref 0.7–4.0)
MCH: 28.4 pg (ref 26.0–34.0)
MCHC: 30.3 g/dL (ref 30.0–36.0)
MCV: 93.6 fL (ref 80.0–100.0)
Monocytes Absolute: 0.8 10*3/uL (ref 0.1–1.0)
Monocytes Relative: 10 %
Neutro Abs: 5.6 10*3/uL (ref 1.7–7.7)
Neutrophils Relative %: 77 %
Platelet Count: 241 10*3/uL (ref 150–400)
RBC: 4.23 MIL/uL (ref 3.87–5.11)
RDW: 14.6 % (ref 11.5–15.5)
WBC Count: 7.4 10*3/uL (ref 4.0–10.5)
nRBC: 0 % (ref 0.0–0.2)

## 2018-12-31 LAB — TSH: TSH: 3.835 u[IU]/mL (ref 0.308–3.960)

## 2018-12-31 MED ORDER — SODIUM CHLORIDE 0.9 % IV SOLN
9.8000 mg/kg | Freq: Once | INTRAVENOUS | Status: AC
  Administered 2018-12-31: 1000 mg via INTRAVENOUS
  Filled 2018-12-31: qty 20

## 2018-12-31 MED ORDER — SODIUM CHLORIDE 0.9 % IV SOLN
Freq: Once | INTRAVENOUS | Status: AC
  Administered 2018-12-31: 09:00:00 via INTRAVENOUS
  Filled 2018-12-31: qty 250

## 2018-12-31 MED ORDER — DILTIAZEM HCL ER COATED BEADS 240 MG PO CP24: 240.0000 mg | ORAL_CAPSULE | Freq: Every day | ORAL | 6 refills | Status: DC

## 2018-12-31 NOTE — Progress Notes (Signed)
Virtual Visit via Video Note   This visit type was conducted due to national recommendations for restrictions regarding the COVID-19 Pandemic (e.g. social distancing) in an effort to limit this patient's exposure and mitigate transmission in our community.  Due to her co-morbid illnesses, this patient is at least at moderate risk for complications without adequate follow up.  This format is felt to be most appropriate for this patient at this time.  All issues noted in this document were discussed and addressed.  A limited physical exam was performed with this format.  Please refer to the patient's chart for her consent to telehealth for [facility].   Date:  12/31/2018   ID:  Deborah [name], DOB 08/16/1955, MRN [id number]  Patient Location: Home Provider Location: Office  PCP:  [name], MD  Cardiologist:  Kathlyn Sacramento, MD  Electrophysiologist:  None   Evaluation Performed:  Follow-Up Visit  Chief Complaint: Elevated blood pressure and heart rate  History of Present Illness:    Deborah [name] is a 63 y.o. female was seen via video visit today for follow-up regarding chronic diastolic heart failure and chronic right heart failure due to COPD. She has known history of previous tobacco use with COPD, hypertension, Obesity and bipolar disorder. She was hospitalized in January, 2016 for CHF.  She had moderate size left pleural effusion. Echocardiogram showed an ejection fraction of 87-86%, grade 1 diastolic dysfunction, dilated right ventricle with hypokinesis and mild pulmonary hypertension.  VQ scan was low probability for pulmonary embolism.    She quit smoking completely since then . She was diagnosed with squamous cell carcinoma of the lung , stage IIIb in October, 2019.  She had large left lower lobe mass in addition to mediastinal lymphadenopathy.  The patient was treated with radiation and chemotherapy which was recently finished. She is currently getting immunotherapy.  She has been doing reasonably well with no chest pain.  She describes stable exertional dyspnea.  She does have palpitations and tachycardia with exertion.  Blood pressure continues to be elevated.  The patient does not have symptoms concerning for COVID-19 infection (fever, chills, cough, or new shortness of breath).    Past Medical History:  Diagnosis Date  . Anemia    as teen  . Asthma   . CHF (congestive heart failure) ([place])   . COPD, mild ([place])   . Depression   . Diverticulitis   . Family history of anesthesia complication    vomiting  . GI bleeding   . Heart failure ([place])    New onset 07/25/14  . Histoplasmosis    left eye  . Hyperkalemia   . Hypertension   . Obesity (BMI 30-39.9)   . Pneumonia    dx wtih pneumonia on 05/27/16- seen by [facility]   . PONV (postoperative nausea and vomiting)   . Restrictive lung disease   . Shortness of breath    with exertion   . Sleep apnea    mask and oxygen at nite for sleep at 2L   . Tobacco abuse   . Umbilical hernia    Past Surgical History:  Procedure Laterality Date  . APPLICATION OF WOUND VAC  03/19/2013   Procedure: APPLICATION OF WOUND VAC;  Surgeon: Madilyn Hook, DO;  Location: [facility];  Service: General;;  . CESAREAN SECTION  04/12/85  . COLONOSCOPY WITH PROPOFOL N/A 06/13/2016   Procedure: COLONOSCOPY WITH PROPOFOL;  Surgeon: Jerene Bears, MD;  [facility]: [facility];  Service:  Gastroenterology;  Laterality: N/A;  . ERCP N/A 02/15/2017   Procedure: ENDOSCOPIC RETROGRADE CHOLANGIOPANCREATOGRAPHY (ERCP);  Surgeon: [name], MD;  Location: [facility];  Service: Endoscopy;  Laterality: N/A;  . INSERTION OF MESH N/A 03/19/2013   Procedure: INSERTION OF MESH;  Surgeon: Madilyn Hook, DO;  Location: [facility];  Service: General;  Laterality: N/A;  . VENTRAL HERNIA REPAIR N/A 03/19/2013   Procedure:  OPEN VENTRAL HERNIA REPAIR WITH MESH AND APPLICATION OF WOUND VAC;  Surgeon: Madilyn Hook, DO;  Location: [facility];  Service: General;   Laterality: N/A;  . VIDEO BRONCHOSCOPY Bilateral 04/19/2018   Procedure: VIDEO BRONCHOSCOPY WITH FLUORO;  Surgeon: Rigoberto Noel, MD;  [facility]: [facility];  Service: Cardiopulmonary;  Laterality: Bilateral;     Current Meds  Medication Sig  . acetaminophen (TYLENOL) 500 MG tablet Take 1,500 mg by mouth 2 (two) times daily as needed for moderate pain or headache.  . albuterol (PROVENTIL) (2.5 MG/3ML) 0.083% nebulizer solution USE 1 VIAL VIA NEBULIZER EVERY 3 HOURS AS NEEDED FOR WHEEZING OR SHORTNESS OF BREATH (Patient taking differently: Take 2.5 mg by nebulization every 3 (three) hours as needed for wheezing or shortness of breath. )  . aspirin 81 MG chewable tablet Chew 1 tablet (81 mg total) by mouth daily.  . Cholecalciferol (D3 ADULT PO) Take 1,000 mg by mouth.  . diltiazem (CARDIZEM CD) 120 MG 24 hr capsule TAKE 1 CAPSULE BY MOUTH EVERY DAY  . furosemide (LASIX) 20 MG tablet TAKE 2 TABLETS EACH MORNING AND 1 TAB AT 3 PM  . ipratropium-albuterol (DUONEB) 0.5-2.5 (3) MG/3ML SOLN USE 1 VIAL VIA NEBULIZER EVERY 6 HOURS AS NEEDED (Patient taking differently: Inhale 3 mLs into the lungs every 6 (six) hours as needed (shortness of breath or wheezing). )  . montelukast (SINGULAIR) 10 MG tablet TAKE 1 TABLET(10 MG) BY MOUTH AT BEDTIME  . OXYGEN Inhale 4 L/min into the lungs continuous.   . potassium chloride SA (KLOR-CON M20) 20 MEQ tablet TAKE 1/2 TABLET BY MOUTH DAILY  . prochlorperazine (COMPAZINE) 10 MG tablet TAKE 1 TABLET BY MOUTH EVERY 6 HOURS AS NEEDED FOR NAUSEA OR VOMITING.  Marland Kitchen Spacer/Aero-Holding Chambers (AEROCHAMBER MV) inhaler Use as instructed  . VENTOLIN HFA 108 (90 Base) MCG/ACT inhaler INHALE 2 PUFFS BY MOUTH EVERY 6 HOURS AS NEEDED FOR WHEEZING OR SHORTNESS OF BREATH  . vitamin B-12 (CYANOCOBALAMIN) 500 MCG tablet Take 500 mcg by mouth daily.     Allergies:   Patient has no known allergies.   Social History   Tobacco Use  . Smoking status: Former Smoker    Packs/day:  3.00    Years: 43.00    Pack years: 129.00    Types: Cigarettes    Quit date: 12/17/2012    Years since quitting: 6.0  . Smokeless tobacco: Never Used  Substance Use Topics  . Alcohol use: No    Alcohol/week: 0.0 standard drinks  . Drug use: No     Family Hx: The patient's family history includes Cancer in an other family member; Emphysema in her mother and another family member; Heart attack in her mother; Heart failure in an other family member; Lymphoma in her maternal uncle; Pancreatic cancer in her maternal aunt. There is no history of Colon cancer or Breast cancer.  ROS:   Please see the history of present illness.     All other systems reviewed and are negative.   Prior CV studies:   The following studies were reviewed today:  Labs/Other Tests and Data Reviewed:    EKG:  No ECG reviewed.  Recent Labs: 12/31/2018: ALT 14; BUN 17; Creatinine 0.79; Hemoglobin 12.0; Platelet Count 241; Potassium 3.8; Sodium 144; TSH 3.835   Recent Lipid Panel Lab Results  Component Value Date/Time   CHOL 200 03/29/2015 12:16 PM   TRIG 98.0 03/29/2015 12:16 PM   HDL 44.60 03/29/2015 12:16 PM   CHOLHDL 4 03/29/2015 12:16 PM   LDLCALC 136 (H) 03/29/2015 12:16 PM    Wt Readings from Last 3 Encounters:  12/31/18 242 lb (109.8 kg)  12/31/18 242 lb 1.6 oz (109.8 kg)  12/17/18 241 lb (109.3 kg)     Objective:    Vital Signs:  BP (!) 188/73   Pulse 73   Ht 5\' 2"  (1.575 m)   Wt 242 lb (109.8 kg)   BMI 44.26 kg/m    VITAL SIGNS:  reviewed GEN:  no acute distress EYES:  sclerae anicteric, EOMI - Extraocular Movements Intact RESPIRATORY:  normal respiratory effort, symmetric expansion SKIN:  no rash, lesions or ulcers. MUSCULOSKELETAL:  no obvious deformities. NEURO:  alert and oriented x 3, no obvious focal deficit PSYCH:  normal affect  ASSESSMENT & PLAN:    1.  Chronic diastolic heart failure: She appears to be euvolemic on current dose of furosemide.    2. Essential  hypertension: Blood pressure continues to be elevated and she has been having intermittent episodes of sinus tachycardia.  I elected to increase diltiazem extended release 240 mg once daily. I reviewed her labs from today which were unremarkable including CBC, basic metabolic profile and TSH  3.  Stage IIIb lung cancer, status post radiation and chemotherapy.  Followed by oncology and currently getting immunotherapy.  COVID-19 Education: The signs and symptoms of COVID-19 were discussed with the patient and how to seek care for testing (follow up with PCP or arrange E-visit).  The importance of social distancing was discussed today.  Time:   Today, I have spent 10 minutes with the patient with telehealth technology discussing the above problems.     Medication Adjustments/Labs and Tests Ordered: Current medicines are reviewed at length with the patient today.  Concerns regarding medicines are outlined above.   Tests Ordered: No orders of the defined types were placed in this encounter.   Medication Changes: No orders of the defined types were placed in this encounter.   Follow Up:  In Person in 6 month(s)  Signed, Kathlyn Sacramento, MD  12/31/2018 4:23 PM    [place]

## 2018-12-31 NOTE — Patient Instructions (Signed)
[facility] Discharge Instructions for Patients Receiving Chemotherapy  Today you received the following chemotherapy agents: Imfinzi.  To help prevent nausea and vomiting after your treatment, we encourage you to take your nausea medication as directed.   If you develop nausea and vomiting that is not controlled by your nausea medication, call the clinic.   BELOW ARE SYMPTOMS THAT SHOULD BE REPORTED IMMEDIATELY:  *FEVER GREATER THAN 100.5 F  *CHILLS WITH OR WITHOUT FEVER  NAUSEA AND VOMITING THAT IS NOT CONTROLLED WITH YOUR NAUSEA MEDICATION  *UNUSUAL SHORTNESS OF BREATH  *UNUSUAL BRUISING OR BLEEDING  TENDERNESS IN MOUTH AND THROAT WITH OR WITHOUT PRESENCE OF ULCERS  *URINARY PROBLEMS  *BOWEL PROBLEMS  UNUSUAL RASH Items with * indicate a potential emergency and should be followed up as soon as possible.  Feel free to call the clinic should you have any questions or concerns. The clinic phone number is [phone].  Please show the CHEMO ALERT CARD at check-in to the Emergency Department and triage nurse.   

## 2018-12-31 NOTE — Progress Notes (Signed)
Okay to treat -Per Dr Julien Nordmann it is okay to treat pt today with Durvalumab and heart rate of 120/min.

## 2018-12-31 NOTE — Patient Instructions (Addendum)
Medication Instructions:  - Your physician has recommended you make the following change in your medication:   1) Increase diltiazem to 240 mg- take 1 capsule by mouth once daily  If you need a refill on your cardiac medications before your next appointment, please call your pharmacy.   Lab work: None If you have labs (blood work) drawn today and your tests are completely normal, you will receive your results only by: Marland Kitchen MyChart Message (if you have MyChart) OR . A paper copy in the mail If you have any lab test that is abnormal or we need to change your treatment, we will call you to review the results.  Testing/Procedures: None  Follow-Up: At [facility], you and your health needs are our priority.  As part of our continuing mission to provide you with exceptional heart care, we have created designated Provider Care Teams.  These Care Teams include your primary Cardiologist (physician) and Advanced Practice Providers (APPs -  Physician Assistants and Nurse Practitioners) who all work together to provide you with the care you need, when you need it.  You will need a follow up appointment in 6 months (December).   Please call our office 2 months in advance to schedule this appointment.  (call in early October to schedule)  You may see Kathlyn Sacramento, MD or one of the following Advanced Practice Providers on your designated Care Team:   Murray Hodgkins, NP Christell Faith, PA-C . Marrianne Mood, PA-C

## 2018-12-31 NOTE — Progress Notes (Signed)
[facility] Telephone:([phone]   Fax:([phone]) [phone]  OFFICE PROGRESS NOTE  [name], MD [place] [address]  DIAGNOSIS: stage IIIB (T3, N2, M0) non-small cell lung cancer, squamous cell carcinoma presented with large left lower lobe lung mass in addition to mediastinal lymphadenopathy diagnosed in October 2019. PDL 1 expression is negative  PRIOR THERAPY: Concurrent chemoradiation with weekly carboplatin for AUC of 2 and paclitaxel 45 mg/M2.  Status post 7 cycles with partial response.Marland Kitchen  CURRENT THERAPY: Consolidation treatment with immunotherapy with Imfinzi 10 mg/KG every 2 weeks.  First dose July 31, 2018.  Status post 11 cycles.  INTERVAL HISTORY: Deborah [name] 63 y.o. female returns to the clinic today for follow-up visit.  The patient is feeling fine today with no concerning complaints except for the shortness of breath at baseline increased with exertion.  She denied having any chest pain, cough or hemoptysis.  She denied having any fever or chills.  She has no nausea, vomiting, diarrhea or constipation.  She feels warm all the time even when it is cold outside.  The patient is here today for evaluation before starting cycle #12 of her treatment.   MEDICAL HISTORY: Past Medical History:  Diagnosis Date  . Anemia    as teen  . Asthma   . CHF (congestive heart failure) ([place])   . COPD, mild ([place])   . Depression   . Diverticulitis   . Family history of anesthesia complication    vomiting  . GI bleeding   . Heart failure ([place])    New onset 07/25/14  . Histoplasmosis    left eye  . Hyperkalemia   . Hypertension   . Obesity (BMI 30-39.9)   . Pneumonia    dx wtih pneumonia on 05/27/16- seen by [facility]   . PONV (postoperative nausea and vomiting)   . Restrictive lung disease   . Shortness of breath    with exertion   . Sleep apnea    mask and oxygen at nite for sleep at 2L   . Tobacco abuse   . Umbilical  hernia     ALLERGIES:  has No Known Allergies.  MEDICATIONS:  Current Outpatient Medications  Medication Sig Dispense Refill  . acetaminophen (TYLENOL) 500 MG tablet Take 1,500 mg by mouth 2 (two) times daily as needed for moderate pain or headache.    . albuterol (PROVENTIL) (2.5 MG/3ML) 0.083% nebulizer solution USE 1 VIAL VIA NEBULIZER EVERY 3 HOURS AS NEEDED FOR WHEEZING OR SHORTNESS OF BREATH (Patient taking differently: Take 2.5 mg by nebulization every 3 (three) hours as needed for wheezing or shortness of breath. ) 1125 mL 1  . aspirin 81 MG chewable tablet Chew 1 tablet (81 mg total) by mouth daily. 30 tablet 1  . Cholecalciferol (D3 ADULT PO) Take 1,000 mg by mouth.    . diltiazem (CARDIZEM CD) 120 MG 24 hr capsule TAKE 1 CAPSULE BY MOUTH EVERY DAY 90 capsule 1  . furosemide (LASIX) 20 MG tablet TAKE 2 TABLETS EACH MORNING AND 1 TAB AT 3 PM 270 tablet 0  . ipratropium-albuterol (DUONEB) 0.5-2.5 (3) MG/3ML SOLN USE 1 VIAL VIA NEBULIZER EVERY 6 HOURS AS NEEDED (Patient taking differently: Inhale 3 mLs into the lungs every 6 (six) hours as needed (shortness of breath or wheezing). ) 360 mL 2  . montelukast (SINGULAIR) 10 MG tablet TAKE 1 TABLET(10 MG) BY MOUTH AT BEDTIME 90 tablet 1  .  OXYGEN Inhale 4 L/min into the lungs continuous.     . potassium chloride SA (KLOR-CON M20) 20 MEQ tablet TAKE 1/2 TABLET BY MOUTH DAILY 45 tablet 1  . prochlorperazine (COMPAZINE) 10 MG tablet TAKE 1 TABLET BY MOUTH EVERY 6 HOURS AS NEEDED FOR NAUSEA OR VOMITING. (Patient not taking: Reported on 10/11/2018) 30 tablet 0  . Spacer/Aero-Holding Chambers (AEROCHAMBER MV) inhaler Use as instructed 1 each 0  . VENTOLIN HFA 108 (90 Base) MCG/ACT inhaler INHALE 2 PUFFS BY MOUTH EVERY 6 HOURS AS NEEDED FOR WHEEZING OR SHORTNESS OF BREATH 18 g 5  . vitamin B-12 (CYANOCOBALAMIN) 500 MCG tablet Take 500 mcg by mouth daily.     No current facility-administered medications for this visit.     SURGICAL HISTORY:   Past Surgical History:  Procedure Laterality Date  . APPLICATION OF WOUND VAC  03/19/2013   Procedure: APPLICATION OF WOUND VAC;  Surgeon: Madilyn Hook, DO;  [facility]: [facility];  Service: General;;  . CESAREAN SECTION  04/12/85  . COLONOSCOPY WITH PROPOFOL N/A 06/13/2016   Procedure: COLONOSCOPY WITH PROPOFOL;  Surgeon: Jerene Bears, MD;  [facility];  Service: Gastroenterology;  Laterality: N/A;  . ERCP N/A 02/15/2017   Procedure: ENDOSCOPIC RETROGRADE CHOLANGIOPANCREATOGRAPHY (ERCP);  Surgeon: [name], MD;  Location: [facility];  Service: Endoscopy;  Laterality: N/A;  . INSERTION OF MESH N/A 03/19/2013   Procedure: INSERTION OF MESH;  Surgeon: Madilyn Hook, DO;  [facility]: [facility];  Service: General;  Laterality: N/A;  . VENTRAL HERNIA REPAIR N/A 03/19/2013   Procedure:  OPEN VENTRAL HERNIA REPAIR WITH MESH AND APPLICATION OF WOUND VAC;  Surgeon: Madilyn Hook, DO;  [facility]: [facility];  Service: General;  Laterality: N/A;  . VIDEO BRONCHOSCOPY Bilateral 04/19/2018   Procedure: VIDEO BRONCHOSCOPY WITH FLUORO;  Surgeon: Rigoberto Noel, MD;  [facility];  Service: Cardiopulmonary;  Laterality: Bilateral;    REVIEW OF SYSTEMS:  A comprehensive review of systems was negative except for: Respiratory: positive for dyspnea on exertion   PHYSICAL EXAMINATION: General appearance: alert, cooperative and no distress Head: Normocephalic, without obvious abnormality, atraumatic Neck: no adenopathy, no JVD, supple, symmetrical, trachea midline and thyroid not enlarged, symmetric, no tenderness/mass/nodules Lymph nodes: Cervical, supraclavicular, and axillary nodes normal. Resp: clear to auscultation bilaterally Back: symmetric, no curvature. ROM normal. No CVA tenderness. Cardio: regular rate and rhythm, S1, S2 normal, no murmur, click, rub or gallop GI: soft, non-tender; bowel sounds normal; no masses,  no organomegaly Extremities: extremities normal, atraumatic, no cyanosis or edema   ECOG PERFORMANCE STATUS: 1 - Symptomatic but completely ambulatory  Blood pressure (!) 157/79, pulse (!) 120, temperature 98.3 F (36.8 C), temperature source Oral, resp. rate 18, height 5\' 2"  (1.575 m), weight 242 lb 1.6 oz (109.8 kg), SpO2 96 %.  LABORATORY DATA: Lab Results  Component Value Date   WBC 7.4 12/31/2018   HGB 12.0 12/31/2018   HCT 39.6 12/31/2018   MCV 93.6 12/31/2018   PLT 241 12/31/2018      Chemistry      Component Value Date/Time   NA 143 12/17/2018 0821   NA 143 11/23/2016 1504   K 4.2 12/17/2018 0821   CL 103 12/17/2018 0821   CO2 26 12/17/2018 0821   BUN 17 12/17/2018 0821   BUN 14 11/23/2016 1504   CREATININE 0.90 12/17/2018 0821      Component Value Date/Time   CALCIUM 9.1 12/17/2018 0821   ALKPHOS 89 12/17/2018 0821   AST 14 (  L) 12/17/2018 0821   ALT 10 12/17/2018 0821   BILITOT <0.2 (L) 12/17/2018 2774       RADIOGRAPHIC STUDIES: No results found.  ASSESSMENT AND PLAN: This is a very pleasant 63 years old white female with stage IIIb non-small cell lung cancer, squamous cell carcinoma. She underwent a course of concurrent chemoradiation with weekly carboplatin and paclitaxel, status post 7 cycles.  She tolerated this treatment well with no concerning adverse effect except for fatigue. She is currently undergoing consolidation treatment with immunotherapy with Imfinzi status post 11 cycles. She continues to tolerate her treatment well with no concerning adverse effects. I recommended for the patient to proceed with cycle #12 today as planned. I will arrange for the patient to have repeat CT scan of the chest before her upcoming visit in 2 weeks for restaging of her disease. For the tachycardia and feeling warm, will continue to monitor her TSH closely.  She is not currently on any thyroid medications. The patient was advised to call immediately if she has any concerning symptoms in the interval. The patient voices understanding of current  disease status and treatment options and is in agreement with the current care plan.  All questions were answered. The patient knows to call the clinic with any problems, questions or concerns. We can certainly see the patient much sooner if necessary.  Disclaimer: This note was dictated with voice recognition software. Similar sounding words can inadvertently be transcribed and may not be corrected upon review.

## 2019-01-02 ENCOUNTER — Telehealth: Payer: Self-pay | Admitting: Internal Medicine

## 2019-01-02 NOTE — Telephone Encounter (Signed)
Scheduled per los. called and spoke with patient. Confirmed date and time

## 2019-01-04 ENCOUNTER — Telehealth: Payer: Self-pay | Admitting: Internal Medicine

## 2019-01-04 NOTE — Telephone Encounter (Signed)
Returned call re rescheduling 6/29 lab. Gave patient new time for 1 pm to coordinate with ct.

## 2019-01-13 ENCOUNTER — Ambulatory Visit (HOSPITAL_COMMUNITY)
Admission: RE | Admit: 2019-01-13 | Discharge: 2019-01-13 | Disposition: A | Discharge status: Home / Self Care | Payer: BLUE CROSS/BLUE SHIELD | Source: Ambulatory Visit | Attending: Internal Medicine | Admitting: Internal Medicine

## 2019-01-13 ENCOUNTER — Encounter (HOSPITAL_COMMUNITY): Payer: Self-pay

## 2019-01-13 ENCOUNTER — Inpatient Hospital Stay: Payer: BLUE CROSS/BLUE SHIELD

## 2019-01-13 ENCOUNTER — Other Ambulatory Visit: Payer: Self-pay

## 2019-01-13 DIAGNOSIS — Z9221 Personal history of antineoplastic chemotherapy: Secondary | ICD-10-CM | POA: Diagnosis not present

## 2019-01-13 DIAGNOSIS — J432 Centrilobular emphysema: Secondary | ICD-10-CM | POA: Diagnosis not present

## 2019-01-13 DIAGNOSIS — Z5112 Encounter for antineoplastic immunotherapy: Secondary | ICD-10-CM | POA: Diagnosis not present

## 2019-01-13 DIAGNOSIS — C3432 Malignant neoplasm of lower lobe, left bronchus or lung: Secondary | ICD-10-CM | POA: Diagnosis not present

## 2019-01-13 DIAGNOSIS — C3490 Malignant neoplasm of unspecified part of unspecified bronchus or lung: Secondary | ICD-10-CM | POA: Diagnosis not present

## 2019-01-13 DIAGNOSIS — Z923 Personal history of irradiation: Secondary | ICD-10-CM | POA: Diagnosis not present

## 2019-01-13 DIAGNOSIS — R000 Tachycardia, unspecified: Secondary | ICD-10-CM | POA: Diagnosis not present

## 2019-01-13 DIAGNOSIS — R918 Other nonspecific abnormal finding of lung field: Secondary | ICD-10-CM | POA: Diagnosis not present

## 2019-01-13 DIAGNOSIS — Z79899 Other long term (current) drug therapy: Secondary | ICD-10-CM | POA: Diagnosis not present

## 2019-01-13 LAB — CMP (CANCER CENTER ONLY)
ALT: 14 U/L (ref 0–44)
AST: 16 U/L (ref 15–41)
Albumin: 3.4 g/dL — ABNORMAL LOW (ref 3.5–5.0)
Alkaline Phosphatase: 90 U/L (ref 38–126)
Anion gap: 11 (ref 5–15)
BUN: 21 mg/dL — ABNORMAL HIGH (ref 6–20)
CO2: 28 mmol/L (ref 22–32)
Calcium: 9.1 mg/dL (ref 8.9–10.3)
Chloride: 103 mmol/L (ref 98–111)
Creatinine: 0.92 mg/dL (ref 0.44–1.00)
GFR, Est AFR Am: >60 mL/min (ref >60)
GFR, Estimated: >60 mL/min (ref >60)
Glucose, Bld: 103 mg/dL — ABNORMAL HIGH (ref 70–99)
Potassium: 4.1 mmol/L (ref 3.5–5.1)
Sodium: 142 mmol/L (ref 135–145)
Total Bilirubin: 0.2 mg/dL — ABNORMAL LOW (ref 0.3–1.2)
Total Protein: 7.9 g/dL (ref 6.5–8.1)

## 2019-01-13 LAB — CBC WITH DIFFERENTIAL (CANCER CENTER ONLY)
Abs Immature Granulocytes: 0.04 10*3/uL (ref 0.00–0.07)
Basophils Absolute: 0 10*3/uL (ref 0.0–0.1)
Basophils Relative: 0 %
Eosinophils Absolute: 0.1 10*3/uL (ref 0.0–0.5)
Eosinophils Relative: 1 %
HCT: 40.2 % (ref 36.0–46.0)
Hemoglobin: 12.5 g/dL (ref 12.0–15.0)
Immature Granulocytes: 0 %
Lymphocytes Relative: 9 %
Lymphs Abs: 0.8 10*3/uL (ref 0.7–4.0)
MCH: 28.5 pg (ref 26.0–34.0)
MCHC: 31.1 g/dL (ref 30.0–36.0)
MCV: 91.8 fL (ref 80.0–100.0)
Monocytes Absolute: 0.6 10*3/uL (ref 0.1–1.0)
Monocytes Relative: 6 %
Neutro Abs: 8 10*3/uL — ABNORMAL HIGH (ref 1.7–7.7)
Neutrophils Relative %: 84 %
Platelet Count: 254 10*3/uL (ref 150–400)
RBC: 4.38 MIL/uL (ref 3.87–5.11)
RDW: 14.6 % (ref 11.5–15.5)
WBC Count: 9.7 10*3/uL (ref 4.0–10.5)
nRBC: 0 % (ref 0.0–0.2)

## 2019-01-13 LAB — TSH: TSH: 1.799 u[IU]/mL (ref 0.308–3.960)

## 2019-01-13 MED ORDER — SODIUM CHLORIDE (PF) 0.9 % IJ SOLN
INTRAMUSCULAR | Status: AC
  Filled 2019-01-13: qty 50

## 2019-01-13 MED ORDER — IOHEXOL 300 MG/ML  SOLN
75.0000 mL | Freq: Once | INTRAMUSCULAR | Status: AC | PRN
  Administered 2019-01-13: 75 mL via INTRAVENOUS

## 2019-01-14 ENCOUNTER — Inpatient Hospital Stay: Payer: BLUE CROSS/BLUE SHIELD

## 2019-01-14 ENCOUNTER — Encounter: Payer: Self-pay | Admitting: Internal Medicine

## 2019-01-14 ENCOUNTER — Other Ambulatory Visit: Payer: Self-pay

## 2019-01-14 ENCOUNTER — Other Ambulatory Visit: Payer: BLUE CROSS/BLUE SHIELD

## 2019-01-14 ENCOUNTER — Inpatient Hospital Stay (HOSPITAL_BASED_OUTPATIENT_CLINIC_OR_DEPARTMENT_OTHER): Payer: BLUE CROSS/BLUE SHIELD | Admitting: Internal Medicine

## 2019-01-14 VITALS — HR 94

## 2019-01-14 VITALS — BP 125/75 | HR 106 | Temp 98.4°F | Resp 18 | Ht 62.0 in | Wt 244.5 lb

## 2019-01-14 DIAGNOSIS — I10 Essential (primary) hypertension: Secondary | ICD-10-CM

## 2019-01-14 DIAGNOSIS — Z923 Personal history of irradiation: Secondary | ICD-10-CM

## 2019-01-14 DIAGNOSIS — Z5112 Encounter for antineoplastic immunotherapy: Secondary | ICD-10-CM | POA: Diagnosis not present

## 2019-01-14 DIAGNOSIS — C3432 Malignant neoplasm of lower lobe, left bronchus or lung: Secondary | ICD-10-CM

## 2019-01-14 DIAGNOSIS — Z9221 Personal history of antineoplastic chemotherapy: Secondary | ICD-10-CM | POA: Diagnosis not present

## 2019-01-14 DIAGNOSIS — J984 Other disorders of lung: Secondary | ICD-10-CM

## 2019-01-14 DIAGNOSIS — R000 Tachycardia, unspecified: Secondary | ICD-10-CM | POA: Diagnosis not present

## 2019-01-14 DIAGNOSIS — Z79899 Other long term (current) drug therapy: Secondary | ICD-10-CM

## 2019-01-14 MED ORDER — SODIUM CHLORIDE 0.9 % IV SOLN
Freq: Once | INTRAVENOUS | Status: AC
  Administered 2019-01-14: 09:00:00 via INTRAVENOUS
  Filled 2019-01-14: qty 250

## 2019-01-14 MED ORDER — SODIUM CHLORIDE 0.9 % IV SOLN
9.8000 mg/kg | Freq: Once | INTRAVENOUS | Status: AC
  Administered 2019-01-14: 1000 mg via INTRAVENOUS
  Filled 2019-01-14: qty 20

## 2019-01-14 NOTE — Progress Notes (Signed)
[name] [phone]   [phone]  OFFICE PROGRESS NOTE  Deborah Passy, MD [place] [address]  DIAGNOSIS: stage IIIB (T3, N2, M0) non-small cell lung cancer, squamous cell carcinoma presented with large left lower lobe lung mass in addition to mediastinal lymphadenopathy diagnosed in October 2019. PDL 1 expression is negative  PRIOR THERAPY: Concurrent chemoradiation with weekly carboplatin for AUC of 2 and paclitaxel 45 mg/M2.  Status post 7 cycles with partial response.Marland Kitchen  CURRENT THERAPY: Consolidation treatment with immunotherapy with Imfinzi 10 mg/KG every 2 weeks.  First dose July 31, 2018.  Status post 12 cycles.  INTERVAL HISTORY: Deborah [name] 63 y.o. female returns to the clinic today for follow-up visit.  The patient is feeling fine today with no concerning complaints except for the baseline shortness of breath and she is currently on home oxygen.  She denied having any chest pain, cough or hemoptysis.  She denied having any fever or chills.  She has no nausea, vomiting, diarrhea or constipation.  She denied having any headache or visual changes.  She has no weight loss or night sweats.  She continues to tolerate her treatment with Imfinzi fairly well.  The patient had repeat CT scan of the chest performed recently and she is here for evaluation and discussion of her scan results.   MEDICAL HISTORY: Past Medical History:  Diagnosis Date   Anemia    as teen   Asthma    CHF (congestive heart failure) (HCC)    COPD, mild (HCC)    Depression    Diverticulitis    Family history of anesthesia complication    vomiting   GI bleeding    Heart failure ([place])    New onset 07/25/14   Histoplasmosis    left eye   Hyperkalemia    Hypertension    Obesity (BMI 30-39.9)    Pneumonia    dx wtih pneumonia on 05/27/16- seen by [facility]    PONV (postoperative nausea and vomiting)    Restrictive  lung disease    Shortness of breath    with exertion    Sleep apnea    mask and oxygen at nite for sleep at 2L    Tobacco abuse    Umbilical hernia     ALLERGIES:  has No Known Allergies.  MEDICATIONS:  Current Outpatient Medications  Medication Sig Dispense Refill   acetaminophen (TYLENOL) 500 MG tablet Take 1,500 mg by mouth 2 (two) times daily as needed for moderate pain or headache.     albuterol (PROVENTIL) (2.5 MG/3ML) 0.083% nebulizer solution USE 1 VIAL VIA NEBULIZER EVERY 3 HOURS AS NEEDED FOR WHEEZING OR SHORTNESS OF BREATH (Patient taking differently: Take 2.5 mg by nebulization every 3 (three) hours as needed for wheezing or shortness of breath. ) 1125 mL 1   aspirin 81 MG chewable tablet Chew 1 tablet (81 mg total) by mouth daily. 30 tablet 1   Cholecalciferol (D3 ADULT PO) Take 1,000 mg by mouth.     diltiazem (CARDIZEM CD) 240 MG 24 hr capsule Take 1 capsule (240 mg total) by mouth daily. 30 capsule 6   furosemide (LASIX) 20 MG tablet TAKE 2 TABLETS EACH MORNING AND 1 TAB AT 3 PM 270 tablet 0   ipratropium-albuterol (DUONEB) 0.5-2.5 (3) MG/3ML SOLN USE 1 VIAL VIA NEBULIZER EVERY 6 HOURS AS NEEDED (Patient taking differently: Inhale 3 mLs into the lungs every 6 (six) hours as  needed (shortness of breath or wheezing). ) 360 mL 2   montelukast (SINGULAIR) 10 MG tablet TAKE 1 TABLET(10 MG) BY MOUTH AT BEDTIME 90 tablet 1   OXYGEN Inhale 4 L/min into the lungs continuous.      potassium chloride SA (KLOR-CON M20) 20 MEQ tablet TAKE 1/2 TABLET BY MOUTH DAILY 45 tablet 1   prochlorperazine (COMPAZINE) 10 MG tablet TAKE 1 TABLET BY MOUTH EVERY 6 HOURS AS NEEDED FOR NAUSEA OR VOMITING. 30 tablet 0   Spacer/Aero-Holding Chambers (AEROCHAMBER MV) inhaler Use as instructed 1 each 0   VENTOLIN HFA 108 (90 Base) MCG/ACT inhaler INHALE 2 PUFFS BY MOUTH EVERY 6 HOURS AS NEEDED FOR WHEEZING OR SHORTNESS OF BREATH 18 g 5   vitamin B-12 (CYANOCOBALAMIN) 500 MCG tablet Take  500 mcg by mouth daily.     No current facility-administered medications for this visit.     SURGICAL HISTORY:  Past Surgical History:  Procedure Laterality Date   APPLICATION OF WOUND VAC  03/19/2013   Procedure: APPLICATION OF WOUND VAC;  Surgeon: Madilyn Hook, DO;  Location: [facility];  Service: General;;   CESAREAN SECTION  04/12/85   COLONOSCOPY WITH PROPOFOL N/A 06/13/2016   Procedure: COLONOSCOPY WITH PROPOFOL;  Surgeon: Jerene Bears, MD;  Location: [facility];  Service: Gastroenterology;  Laterality: N/A;   ERCP N/A 02/15/2017   Procedure: ENDOSCOPIC RETROGRADE CHOLANGIOPANCREATOGRAPHY (ERCP);  Surgeon: [name], MD;  Location: [facility];  Service: Endoscopy;  Laterality: N/A;   INSERTION OF MESH N/A 03/19/2013   Procedure: INSERTION OF MESH;  Surgeon: Madilyn Hook, DO;  Location: [facility];  Service: General;  Laterality: N/A;   VENTRAL HERNIA REPAIR N/A 03/19/2013   Procedure:  OPEN VENTRAL HERNIA REPAIR WITH MESH AND APPLICATION OF WOUND VAC;  Surgeon: Madilyn Hook, DO;  Location: [facility];  Service: General;  Laterality: N/A;   VIDEO BRONCHOSCOPY Bilateral 04/19/2018   Procedure: VIDEO BRONCHOSCOPY WITH FLUORO;  Surgeon: Rigoberto Noel, MD;  Location: [facility];  Service: Cardiopulmonary;  Laterality: Bilateral;    REVIEW OF SYSTEMS:  Constitutional: negative Eyes: negative Ears, nose, mouth, throat, and face: negative Respiratory: positive for dyspnea on exertion Cardiovascular: negative Gastrointestinal: negative Genitourinary:negative Integument/breast: negative Hematologic/lymphatic: negative Musculoskeletal:negative Neurological: negative Behavioral/Psych: negative Endocrine: negative Allergic/Immunologic: negative   PHYSICAL EXAMINATION: General appearance: alert, cooperative and no distress Head: Normocephalic, without obvious abnormality, atraumatic Neck: no adenopathy, no JVD, supple, symmetrical, trachea midline and thyroid not enlarged, symmetric,  no tenderness/mass/nodules Lymph nodes: Cervical, supraclavicular, and axillary nodes normal. Resp: clear to auscultation bilaterally Back: symmetric, no curvature. ROM normal. No CVA tenderness. Cardio: regular rate and rhythm, S1, S2 normal, no murmur, click, rub or gallop GI: soft, non-tender; bowel sounds normal; no masses,  no organomegaly Extremities: extremities normal, atraumatic, no cyanosis or edema Neurologic: Alert and oriented X 3, normal strength and tone. Normal symmetric reflexes. Normal coordination and gait  ECOG PERFORMANCE STATUS: 1 - Symptomatic but completely ambulatory  Blood pressure 125/75, pulse (!) 106, temperature 98.4 F (36.9 C), temperature source Oral, resp. rate 18, height 5\' 2"  (1.575 m), weight 244 lb 8 oz (110.9 kg), SpO2 96 %.  LABORATORY DATA: Lab Results  Component Value Date   WBC 9.7 01/13/2019   HGB 12.5 01/13/2019   HCT 40.2 01/13/2019   MCV 91.8 01/13/2019   PLT 254 01/13/2019      Chemistry      Component Value Date/Time   NA 142 01/13/2019 1319   NA 143 11/23/2016 1504  K 4.1 01/13/2019 1319   CL 103 01/13/2019 1319   CO2 28 01/13/2019 1319   BUN 21 (H) 01/13/2019 1319   BUN 14 11/23/2016 1504   CREATININE 0.92 01/13/2019 1319      Component Value Date/Time   CALCIUM 9.1 01/13/2019 1319   ALKPHOS 90 01/13/2019 1319   AST 16 01/13/2019 1319   ALT 14 01/13/2019 1319   BILITOT 0.2 (L) 01/13/2019 1319       RADIOGRAPHIC STUDIES: Ct Chest W Contrast  Result Date: 01/13/2019 CLINICAL DATA:  Lung cancer, chemotherapy and radiation therapy complete. Cough and shortness of breath. EXAM: CT CHEST WITH CONTRAST TECHNIQUE: Multidetector CT imaging of the chest was performed during intravenous contrast administration. CONTRAST:  43mL OMNIPAQUE IOHEXOL 300 MG/ML  SOLN COMPARISON:  10/21/2018. FINDINGS: Cardiovascular: Atherosclerotic calcification of the aorta and coronary arteries. Heart is at the upper limits of normal in size. No  pericardial effusion. Mediastinum/Nodes: No pathologically enlarged mediastinal, hilar or axillary lymph nodes. Esophagus is grossly unremarkable. Lungs/Pleura: Centrilobular emphysema. Scattered areas of peribronchovascular ground-glass and mild architectural distortion in the left upper lobe and superior segment left lower lobe are similar to minimally improved. There is a central area of volume loss in the left upper lobe (series 7, image 39) which is new but is likely due to mucoid impaction and atelectasis. New or increasing subpleural ground-glass and consolidation in the left lower lobe (83). Residual cystic lesion in the superior segment left lower lobe represents the site of primary malignancy seen on 04/08/2018. Slight worsening and subpleural consolidation and ground-glass in the inferior posterior right lower lobe as well. No pleural fluid. Airway is unremarkable. Upper Abdomen: Liver appears slightly decreased in attenuation diffusely. Continued mild thickening of the body and fundus of the gallbladder. Common bile duct is mildly prominent, as before. Adrenal glands and visualized portions of the kidneys, spleen, pancreas, stomach and bowel are otherwise grossly unremarkable. No upper abdominal adenopathy. Musculoskeletal: Degenerative changes in the spine. No worrisome lytic or sclerotic lesions. IMPRESSION: 1. Waxing and waning areas of ground-glass and consolidation are seen primarily in the left lung, but in the right lower lobe as well, favoring an infectious/inflammatory etiology. There may be underlying changes of radiation therapy in the left hemithorax. 2. Liver appears steatotic. 3. Continued mild thickening of the body and fundus of the gallbladder, nonspecific. Continued attention on follow-up exams is suggested. 4. Aortic atherosclerosis (ICD10-170.0). Coronary artery calcification. 5.  Emphysema (ICD10-J43.9). Electronically Signed   By: Lorin Picket M.D.   On: 01/13/2019 16:04     ASSESSMENT AND PLAN: This is a very pleasant 63 years old white female with stage IIIb non-small cell lung cancer, squamous cell carcinoma. She underwent a course of concurrent chemoradiation with weekly carboplatin and paclitaxel, status post 7 cycles.  She tolerated this treatment well with no concerning adverse effect except for fatigue. She is currently undergoing consolidation treatment with immunotherapy with Imfinzi status post 12 cycles. The patient continues to tolerate this treatment well with no concerning adverse effects. She had repeat CT scan of the chest performed yesterday.  I personally and independently reviewed the scan images and discussed the results with the patient today. Her scan showed no concerning findings for disease progression. I recommended for the patient to continue her current treatment with Imfinzi and she will proceed with cycle #13 today. For the tachycardia she was seen by her cardiologist and he increased the dose of verapamil. The patient will come back for follow-up visit in  2 weeks for evaluation before starting the next cycle of her treatment. She was advised to call immediately if she has any concerning symptoms in the interval. The patient voices understanding of current disease status and treatment options and is in agreement with the current care plan.  All questions were answered. The patient knows to call the clinic with any problems, questions or concerns. We can certainly see the patient much sooner if necessary.  Disclaimer: This note was dictated with voice recognition software. Similar sounding words can inadvertently be transcribed and may not be corrected upon review.

## 2019-01-14 NOTE — Patient Instructions (Signed)
[facility] Discharge Instructions for Patients Receiving Chemotherapy  Today you received the following chemotherapy agents: Imfinzi.  To help prevent nausea and vomiting after your treatment, we encourage you to take your nausea medication as directed.   If you develop nausea and vomiting that is not controlled by your nausea medication, call the clinic.   BELOW ARE SYMPTOMS THAT SHOULD BE REPORTED IMMEDIATELY:  *FEVER GREATER THAN 100.5 F  *CHILLS WITH OR WITHOUT FEVER  NAUSEA AND VOMITING THAT IS NOT CONTROLLED WITH YOUR NAUSEA MEDICATION  *UNUSUAL SHORTNESS OF BREATH  *UNUSUAL BRUISING OR BLEEDING  TENDERNESS IN MOUTH AND THROAT WITH OR WITHOUT PRESENCE OF ULCERS  *URINARY PROBLEMS  *BOWEL PROBLEMS  UNUSUAL RASH Items with * indicate a potential emergency and should be followed up as soon as possible.  Feel free to call the clinic should you have any questions or concerns. The clinic phone number is [phone].  Please show the CHEMO ALERT CARD at check-in to the Emergency Department and triage nurse.   

## 2019-01-27 ENCOUNTER — Other Ambulatory Visit: Payer: Self-pay | Admitting: Family Medicine

## 2019-01-28 ENCOUNTER — Inpatient Hospital Stay: Payer: BLUE CROSS/BLUE SHIELD

## 2019-01-28 ENCOUNTER — Encounter: Payer: Self-pay | Admitting: Internal Medicine

## 2019-01-28 ENCOUNTER — Inpatient Hospital Stay (HOSPITAL_BASED_OUTPATIENT_CLINIC_OR_DEPARTMENT_OTHER): Payer: BLUE CROSS/BLUE SHIELD | Admitting: Internal Medicine

## 2019-01-28 ENCOUNTER — Inpatient Hospital Stay: Payer: BLUE CROSS/BLUE SHIELD | Attending: Internal Medicine

## 2019-01-28 ENCOUNTER — Other Ambulatory Visit: Payer: Self-pay

## 2019-01-28 VITALS — HR 94 | Resp 20

## 2019-01-28 VITALS — BP 106/40 | HR 106 | Temp 99.2°F | Resp 20 | Ht 62.0 in | Wt 241.9 lb

## 2019-01-28 DIAGNOSIS — J449 Chronic obstructive pulmonary disease, unspecified: Secondary | ICD-10-CM

## 2019-01-28 DIAGNOSIS — C3432 Malignant neoplasm of lower lobe, left bronchus or lung: Secondary | ICD-10-CM | POA: Insufficient documentation

## 2019-01-28 DIAGNOSIS — Z5112 Encounter for antineoplastic immunotherapy: Secondary | ICD-10-CM

## 2019-01-28 DIAGNOSIS — Z9981 Dependence on supplemental oxygen: Secondary | ICD-10-CM

## 2019-01-28 DIAGNOSIS — I509 Heart failure, unspecified: Secondary | ICD-10-CM | POA: Insufficient documentation

## 2019-01-28 DIAGNOSIS — I110 Hypertensive heart disease with heart failure: Secondary | ICD-10-CM | POA: Insufficient documentation

## 2019-01-28 DIAGNOSIS — Z6839 Body mass index (BMI) 39.0-39.9, adult: Secondary | ICD-10-CM | POA: Diagnosis not present

## 2019-01-28 DIAGNOSIS — E669 Obesity, unspecified: Secondary | ICD-10-CM | POA: Insufficient documentation

## 2019-01-28 DIAGNOSIS — Z79899 Other long term (current) drug therapy: Secondary | ICD-10-CM | POA: Insufficient documentation

## 2019-01-28 LAB — CBC WITH DIFFERENTIAL (CANCER CENTER ONLY)
Abs Immature Granulocytes: 0.03 10*3/uL (ref 0.00–0.07)
Basophils Absolute: 0.1 10*3/uL (ref 0.0–0.1)
Basophils Relative: 1 %
Eosinophils Absolute: 0.2 10*3/uL (ref 0.0–0.5)
Eosinophils Relative: 2 %
HCT: 41 % (ref 36.0–46.0)
Hemoglobin: 12.6 g/dL (ref 12.0–15.0)
Immature Granulocytes: 0 %
Lymphocytes Relative: 11 %
Lymphs Abs: 1 10*3/uL (ref 0.7–4.0)
MCH: 27.9 pg (ref 26.0–34.0)
MCHC: 30.7 g/dL (ref 30.0–36.0)
MCV: 90.9 fL (ref 80.0–100.0)
Monocytes Absolute: 0.7 10*3/uL (ref 0.1–1.0)
Monocytes Relative: 7 %
Neutro Abs: 7.1 10*3/uL (ref 1.7–7.7)
Neutrophils Relative %: 79 %
Platelet Count: 287 10*3/uL (ref 150–400)
RBC: 4.51 MIL/uL (ref 3.87–5.11)
RDW: 14.2 % (ref 11.5–15.5)
WBC Count: 8.9 10*3/uL (ref 4.0–10.5)
nRBC: 0 % (ref 0.0–0.2)

## 2019-01-28 LAB — CMP (CANCER CENTER ONLY)
ALT: 12 U/L (ref 0–44)
AST: 13 U/L — ABNORMAL LOW (ref 15–41)
Albumin: 3.3 g/dL — ABNORMAL LOW (ref 3.5–5.0)
Alkaline Phosphatase: 92 U/L (ref 38–126)
Anion gap: 13 (ref 5–15)
BUN: 19 mg/dL (ref 6–20)
CO2: 27 mmol/L (ref 22–32)
Calcium: 9 mg/dL (ref 8.9–10.3)
Chloride: 100 mmol/L (ref 98–111)
Creatinine: 0.9 mg/dL (ref 0.44–1.00)
GFR, Est AFR Am: >60 mL/min (ref >60)
GFR, Estimated: >60 mL/min (ref >60)
Glucose, Bld: 107 mg/dL — ABNORMAL HIGH (ref 70–99)
Potassium: 3.8 mmol/L (ref 3.5–5.1)
Sodium: 140 mmol/L (ref 135–145)
Total Bilirubin: 0.2 mg/dL — ABNORMAL LOW (ref 0.3–1.2)
Total Protein: 8 g/dL (ref 6.5–8.1)

## 2019-01-28 MED ORDER — SODIUM CHLORIDE 0.9 % IV SOLN
Freq: Once | INTRAVENOUS | Status: AC
  Administered 2019-01-28: 10:00:00 via INTRAVENOUS
  Filled 2019-01-28: qty 250

## 2019-01-28 MED ORDER — SODIUM CHLORIDE 0.9 % IV SOLN
1120.0000 mg | Freq: Once | INTRAVENOUS | Status: AC
  Administered 2019-01-28: 11:00:00 1120 mg via INTRAVENOUS
  Filled 2019-01-28: qty 2.4

## 2019-01-28 NOTE — Patient Instructions (Signed)
[facility] Discharge Instructions for Patients Receiving Chemotherapy  Today you received the following chemotherapy agents: Durvalumab (Imfinzi)  To help prevent nausea and vomiting after your treatment, we encourage you to take your nausea medication as directed.   If you develop nausea and vomiting that is not controlled by your nausea medication, call the clinic.   BELOW ARE SYMPTOMS THAT SHOULD BE REPORTED IMMEDIATELY:  *FEVER GREATER THAN 100.5 F  *CHILLS WITH OR WITHOUT FEVER  NAUSEA AND VOMITING THAT IS NOT CONTROLLED WITH YOUR NAUSEA MEDICATION  *UNUSUAL SHORTNESS OF BREATH  *UNUSUAL BRUISING OR BLEEDING  TENDERNESS IN MOUTH AND THROAT WITH OR WITHOUT PRESENCE OF ULCERS  *URINARY PROBLEMS  *BOWEL PROBLEMS  UNUSUAL RASH Items with * indicate a potential emergency and should be followed up as soon as possible.  Feel free to call the clinic should you have any questions or concerns. The clinic phone number is [phone].  Please show the [facility] at check-in to the Emergency Department and triage nurse.  Coronavirus (COVID-19) Are you at risk?  Are you at risk for the Coronavirus (COVID-19)?  To be considered HIGH RISK for Coronavirus (COVID-19), you have to meet the following criteria:  . Traveled to [place], [place], [place], [place] or [place]; or in the [place] to [place], [place], [place], or [place]; and have fever, cough, and shortness of breath within the last 2 weeks of travel OR . Been in close contact with a person diagnosed with COVID-19 within the last 2 weeks and have fever, cough, and shortness of breath . IF YOU DO NOT MEET THESE CRITERIA, YOU ARE CONSIDERED LOW RISK FOR COVID-19.  What to do if you are HIGH RISK for COVID-19?  Marland Kitchen If you are having a medical emergency, call 911. . Seek medical care right away. Before you go to a doctor's office, urgent care or emergency department, call ahead and tell them  about your recent travel, contact with someone diagnosed with COVID-19, and your symptoms. You should receive instructions from your physician's office regarding next steps of care.  . When you arrive at healthcare provider, tell the healthcare staff immediately you have returned from visiting [place], [place], [place], [place] or [place]; or traveled in the [place] to [place], [place], [place], or [place]; in the last two weeks or you have been in close contact with a person diagnosed with COVID-19 in the last 2 weeks.   . Tell the health care staff about your symptoms: fever, cough and shortness of breath. . After you have been seen by a medical provider, you will be either: o Tested for (COVID-19) and discharged home on quarantine except to seek medical care if symptoms worsen, and asked to  - Stay home and avoid contact with others until you get your results (4-5 days)  - Avoid travel on public transportation if possible (such as bus, train, or airplane) or o Sent to the Emergency Department by EMS for evaluation, COVID-19 testing, and possible admission depending on your condition and test results.  What to do if you are LOW RISK for COVID-19?  Reduce your risk of any infection by using the same precautions used for avoiding the common cold or flu:  Marland Kitchen Wash your hands often with soap and warm water for at least 20 seconds.  If soap and water are not readily available, use an alcohol-based hand sanitizer with at least 60% alcohol.  . If coughing or  sneezing, cover your mouth and nose by coughing or sneezing into the elbow areas of your shirt or coat, into a tissue or into your sleeve (not your hands). . Avoid shaking hands with others and consider head nods or verbal greetings only. . Avoid touching your eyes, nose, or mouth with unwashed hands.  . Avoid close contact with people who are sick. . Avoid places or events with large numbers of people in one location, like concerts or  sporting events. . Carefully consider travel plans you have or are making. . If you are planning any travel outside or inside the [place], visit the CDC's Travelers' Health webpage for the latest health notices. . If you have some symptoms but not all symptoms, continue to monitor at home and seek medical attention if your symptoms worsen. . If you are having a medical emergency, call 911.   [place] / e-Visit: [email or web address]         [facility]: [facility]: [phone]                   [facility]: [phone]

## 2019-01-28 NOTE — Progress Notes (Signed)
[place] [phone]   [phone]  OFFICE PROGRESS NOTE  [name], MD [place] [address]  DIAGNOSIS: stage IIIB (T3, N2, M0) non-small cell lung cancer, squamous cell carcinoma presented with large left lower lobe lung mass in addition to mediastinal lymphadenopathy diagnosed in October 2019. PDL 1 expression is negative  PRIOR THERAPY: Concurrent chemoradiation with weekly carboplatin for AUC of 2 and paclitaxel 45 mg/M2.  Status post 7 cycles with partial response.Marland Kitchen  CURRENT THERAPY: Consolidation treatment with immunotherapy with Imfinzi 10 mg/KG every 2 weeks.  First dose July 31, 2018.  Status post 13 cycles.  INTERVAL HISTORY: Deborah [name] 63 y.o. female returns to the clinic today for follow-up visit.  The patient is feeling fine today with no concerning complaints except for the baseline shortness of breath increased with exertion.  She is currently on home oxygen.  She denied having any chest pain, cough or hemoptysis.  She denied having any fever or chills.  She has no nausea, vomiting, diarrhea or constipation.  She denied having any headache or visual changes.  She is here today for evaluation before starting cycle #14 of her treatment.   MEDICAL HISTORY: Past Medical History:  Diagnosis Date  . Anemia    as teen  . Asthma   . CHF (congestive heart failure) ([place])   . COPD, mild ([place])   . Depression   . Diverticulitis   . Family history of anesthesia complication    vomiting  . GI bleeding   . Heart failure ([place])    New onset 07/25/14  . Histoplasmosis    left eye  . Hyperkalemia   . Hypertension   . Obesity (BMI 30-39.9)   . Pneumonia    dx wtih pneumonia on 05/27/16- seen by [facility]   . PONV (postoperative nausea and vomiting)   . Restrictive lung disease   . Shortness of breath    with exertion   . Sleep apnea    mask and oxygen at nite for sleep at 2L   . Tobacco abuse   .  Umbilical hernia     ALLERGIES:  has No Known Allergies.  MEDICATIONS:  Current Outpatient Medications  Medication Sig Dispense Refill  . acetaminophen (TYLENOL) 500 MG tablet Take 1,500 mg by mouth 2 (two) times daily as needed for moderate pain or headache.    . albuterol (PROVENTIL) (2.5 MG/3ML) 0.083% nebulizer solution USE 1 VIAL VIA NEBULIZER EVERY 3 HOURS AS NEEDED FOR WHEEZING OR SHORTNESS OF BREATH (Patient taking differently: Take 2.5 mg by nebulization every 3 (three) hours as needed for wheezing or shortness of breath. ) 1125 mL 1  . aspirin 81 MG chewable tablet Chew 1 tablet (81 mg total) by mouth daily. 30 tablet 1  . Cholecalciferol (D3 ADULT PO) Take 1,000 mg by mouth.    . diltiazem (CARDIZEM CD) 240 MG 24 hr capsule Take 1 capsule (240 mg total) by mouth daily. 30 capsule 6  . furosemide (LASIX) 20 MG tablet TAKE 2 TABLETS EACH MORNING AND 1 TAB AT 3 PM 270 tablet 0  . ipratropium-albuterol (DUONEB) 0.5-2.5 (3) MG/3ML SOLN USE 1 VIAL VIA NEBULIZER EVERY 6 HOURS AS NEEDED (Patient taking differently: Inhale 3 mLs into the lungs every 6 (six) hours as needed (shortness of breath or wheezing). ) 360 mL 2  . montelukast (SINGULAIR) 10 MG tablet TAKE 1 TABLET(10 MG) BY MOUTH AT BEDTIME 90 tablet  1  . OXYGEN Inhale 4 L/min into the lungs continuous.     . potassium chloride SA (KLOR-CON M20) 20 MEQ tablet TAKE 1/2 TABLET BY MOUTH DAILY 45 tablet 1  . prochlorperazine (COMPAZINE) 10 MG tablet TAKE 1 TABLET BY MOUTH EVERY 6 HOURS AS NEEDED FOR NAUSEA OR VOMITING. 30 tablet 0  . Spacer/Aero-Holding Chambers (AEROCHAMBER MV) inhaler Use as instructed 1 each 0  . VENTOLIN HFA 108 (90 Base) MCG/ACT inhaler INHALE 2 PUFFS BY MOUTH EVERY 6 HOURS AS NEEDED FOR WHEEZING OR SHORTNESS OF BREATH 18 g 5  . vitamin B-12 (CYANOCOBALAMIN) 500 MCG tablet Take 500 mcg by mouth daily.     No current facility-administered medications for this visit.     SURGICAL HISTORY:  Past Surgical History:   Procedure Laterality Date  . APPLICATION OF WOUND VAC  03/19/2013   Procedure: APPLICATION OF WOUND VAC;  Surgeon: Madilyn Hook, DO;  Location: [facility];  Service: General;;  . CESAREAN SECTION  04/12/85  . COLONOSCOPY WITH PROPOFOL N/A 06/13/2016   Procedure: COLONOSCOPY WITH PROPOFOL;  Surgeon: Jerene Bears, MD;  Location: [facility];  Service: Gastroenterology;  Laterality: N/A;  . ERCP N/A 02/15/2017   Procedure: ENDOSCOPIC RETROGRADE CHOLANGIOPANCREATOGRAPHY (ERCP);  Surgeon: [name], MD;  Location: [facility];  Service: Endoscopy;  Laterality: N/A;  . INSERTION OF MESH N/A 03/19/2013   Procedure: INSERTION OF MESH;  Surgeon: Madilyn Hook, DO;  Location: [facility];  Service: General;  Laterality: N/A;  . VENTRAL HERNIA REPAIR N/A 03/19/2013   Procedure:  OPEN VENTRAL HERNIA REPAIR WITH MESH AND APPLICATION OF WOUND VAC;  Surgeon: Madilyn Hook, DO;  Location: [facility];  Service: General;  Laterality: N/A;  . VIDEO BRONCHOSCOPY Bilateral 04/19/2018   Procedure: VIDEO BRONCHOSCOPY WITH FLUORO;  Surgeon: Rigoberto Noel, MD;  Location: [facility];  Service: Cardiopulmonary;  Laterality: Bilateral;    REVIEW OF SYSTEMS:  A comprehensive review of systems was negative except for: Respiratory: positive for dyspnea on exertion   PHYSICAL EXAMINATION: General appearance: alert, cooperative and no distress Head: Normocephalic, without obvious abnormality, atraumatic Neck: no adenopathy, no JVD, supple, symmetrical, trachea midline and thyroid not enlarged, symmetric, no tenderness/mass/nodules Lymph nodes: Cervical, supraclavicular, and axillary nodes normal. Resp: clear to auscultation bilaterally Back: symmetric, no curvature. ROM normal. No CVA tenderness. Cardio: regular rate and rhythm, S1, S2 normal, no murmur, click, rub or gallop GI: soft, non-tender; bowel sounds normal; no masses,  no organomegaly Extremities: extremities normal, atraumatic, no cyanosis or edema  ECOG PERFORMANCE  STATUS: 1 - Symptomatic but completely ambulatory  Blood pressure (!) 106/40, pulse (!) 106, temperature 99.2 F (37.3 C), temperature source Oral, resp. rate 20, height 5\' 2"  (1.575 m), weight 241 lb 14.4 oz (109.7 kg), SpO2 97 %.  LABORATORY DATA: Lab Results  Component Value Date   WBC 9.7 01/13/2019   HGB 12.5 01/13/2019   HCT 40.2 01/13/2019   MCV 91.8 01/13/2019   PLT 254 01/13/2019      Chemistry      Component Value Date/Time   NA 142 01/13/2019 1319   NA 143 11/23/2016 1504   K 4.1 01/13/2019 1319   CL 103 01/13/2019 1319   CO2 28 01/13/2019 1319   BUN 21 (H) 01/13/2019 1319   BUN 14 11/23/2016 1504   CREATININE 0.92 01/13/2019 1319      Component Value Date/Time   CALCIUM 9.1 01/13/2019 1319   ALKPHOS 90 01/13/2019 1319   AST 16 01/13/2019 1319  ALT 14 01/13/2019 1319   BILITOT 0.2 (L) 01/13/2019 1319       RADIOGRAPHIC STUDIES: Ct Chest W Contrast  Result Date: 01/13/2019 CLINICAL DATA:  Lung cancer, chemotherapy and radiation therapy complete. Cough and shortness of breath. EXAM: CT CHEST WITH CONTRAST TECHNIQUE: Multidetector CT imaging of the chest was performed during intravenous contrast administration. CONTRAST:  71mL OMNIPAQUE IOHEXOL 300 MG/ML  SOLN COMPARISON:  10/21/2018. FINDINGS: Cardiovascular: Atherosclerotic calcification of the aorta and coronary arteries. Heart is at the upper limits of normal in size. No pericardial effusion. Mediastinum/Nodes: No pathologically enlarged mediastinal, hilar or axillary lymph nodes. Esophagus is grossly unremarkable. Lungs/Pleura: Centrilobular emphysema. Scattered areas of peribronchovascular ground-glass and mild architectural distortion in the left upper lobe and superior segment left lower lobe are similar to minimally improved. There is a central area of volume loss in the left upper lobe (series 7, image 39) which is new but is likely due to mucoid impaction and atelectasis. New or increasing subpleural  ground-glass and consolidation in the left lower lobe (83). Residual cystic lesion in the superior segment left lower lobe represents the site of primary malignancy seen on 04/08/2018. Slight worsening and subpleural consolidation and ground-glass in the inferior posterior right lower lobe as well. No pleural fluid. Airway is unremarkable. Upper Abdomen: Liver appears slightly decreased in attenuation diffusely. Continued mild thickening of the body and fundus of the gallbladder. Common bile duct is mildly prominent, as before. Adrenal glands and visualized portions of the kidneys, spleen, pancreas, stomach and bowel are otherwise grossly unremarkable. No upper abdominal adenopathy. Musculoskeletal: Degenerative changes in the spine. No worrisome lytic or sclerotic lesions. IMPRESSION: 1. Waxing and waning areas of ground-glass and consolidation are seen primarily in the left lung, but in the right lower lobe as well, favoring an infectious/inflammatory etiology. There may be underlying changes of radiation therapy in the left hemithorax. 2. Liver appears steatotic. 3. Continued mild thickening of the body and fundus of the gallbladder, nonspecific. Continued attention on follow-up exams is suggested. 4. Aortic atherosclerosis (ICD10-170.0). Coronary artery calcification. 5.  Emphysema (ICD10-J43.9). Electronically Signed   By: Lorin Picket M.D.   On: 01/13/2019 16:04    ASSESSMENT AND PLAN: This is a very pleasant 63 years old white female with stage IIIb non-small cell lung cancer, squamous cell carcinoma. She underwent a course of concurrent chemoradiation with weekly carboplatin and paclitaxel, status post 7 cycles.  She tolerated this treatment well with no concerning adverse effect except for fatigue. She is currently undergoing consolidation treatment with immunotherapy with Imfinzi status post 13 cycles. The patient continues to tolerate her treatment well with no concerning adverse effects. I  recommended for her to proceed with cycle #14 today as planned. I will see her back for follow-up visit in 2 weeks for evaluation before the next cycle of her treatment. For the COPD, she will continue with her home oxygen for now. She was advised to call immediately if she has any concerning symptoms in the interval. The patient voices understanding of current disease status and treatment options and is in agreement with the current care plan.  All questions were answered. The patient knows to call the clinic with any problems, questions or concerns. We can certainly see the patient much sooner if necessary.  Disclaimer: This note was dictated with voice recognition software. Similar sounding words can inadvertently be transcribed and may not be corrected upon review.

## 2019-01-29 ENCOUNTER — Telehealth: Payer: Self-pay | Admitting: Internal Medicine

## 2019-01-29 NOTE — Telephone Encounter (Signed)
Scheduled appt per 7/14 los - pt to get an updated schedule next visit.

## 2019-02-11 ENCOUNTER — Other Ambulatory Visit: Payer: Self-pay

## 2019-02-11 ENCOUNTER — Inpatient Hospital Stay (HOSPITAL_BASED_OUTPATIENT_CLINIC_OR_DEPARTMENT_OTHER): Payer: BLUE CROSS/BLUE SHIELD | Admitting: Physician Assistant

## 2019-02-11 ENCOUNTER — Inpatient Hospital Stay: Payer: BLUE CROSS/BLUE SHIELD

## 2019-02-11 VITALS — BP 110/66 | HR 93 | Temp 99.1°F | Resp 18 | Ht 62.0 in | Wt 243.0 lb

## 2019-02-11 DIAGNOSIS — C3432 Malignant neoplasm of lower lobe, left bronchus or lung: Secondary | ICD-10-CM

## 2019-02-11 DIAGNOSIS — E669 Obesity, unspecified: Secondary | ICD-10-CM

## 2019-02-11 DIAGNOSIS — Z6839 Body mass index (BMI) 39.0-39.9, adult: Secondary | ICD-10-CM

## 2019-02-11 DIAGNOSIS — I110 Hypertensive heart disease with heart failure: Secondary | ICD-10-CM | POA: Diagnosis not present

## 2019-02-11 DIAGNOSIS — Z79899 Other long term (current) drug therapy: Secondary | ICD-10-CM | POA: Diagnosis not present

## 2019-02-11 DIAGNOSIS — Z5112 Encounter for antineoplastic immunotherapy: Secondary | ICD-10-CM

## 2019-02-11 DIAGNOSIS — J449 Chronic obstructive pulmonary disease, unspecified: Secondary | ICD-10-CM

## 2019-02-11 DIAGNOSIS — C3490 Malignant neoplasm of unspecified part of unspecified bronchus or lung: Secondary | ICD-10-CM

## 2019-02-11 DIAGNOSIS — I509 Heart failure, unspecified: Secondary | ICD-10-CM | POA: Diagnosis not present

## 2019-02-11 DIAGNOSIS — Z9981 Dependence on supplemental oxygen: Secondary | ICD-10-CM | POA: Diagnosis not present

## 2019-02-11 LAB — CBC WITH DIFFERENTIAL (CANCER CENTER ONLY)
Abs Immature Granulocytes: 0.05 10*3/uL (ref 0.00–0.07)
Basophils Absolute: 0 10*3/uL (ref 0.0–0.1)
Basophils Relative: 0 %
Eosinophils Absolute: 0.2 10*3/uL (ref 0.0–0.5)
Eosinophils Relative: 2 %
HCT: 41.3 % (ref 36.0–46.0)
Hemoglobin: 12.7 g/dL (ref 12.0–15.0)
Immature Granulocytes: 1 %
Lymphocytes Relative: 12 %
Lymphs Abs: 1.1 10*3/uL (ref 0.7–4.0)
MCH: 28.3 pg (ref 26.0–34.0)
MCHC: 30.8 g/dL (ref 30.0–36.0)
MCV: 92 fL (ref 80.0–100.0)
Monocytes Absolute: 0.8 10*3/uL (ref 0.1–1.0)
Monocytes Relative: 9 %
Neutro Abs: 7 10*3/uL (ref 1.7–7.7)
Neutrophils Relative %: 76 %
Platelet Count: 285 10*3/uL (ref 150–400)
RBC: 4.49 MIL/uL (ref 3.87–5.11)
RDW: 13.8 % (ref 11.5–15.5)
WBC Count: 9.2 10*3/uL (ref 4.0–10.5)
nRBC: 0 % (ref 0.0–0.2)

## 2019-02-11 LAB — CMP (CANCER CENTER ONLY)
ALT: 16 U/L (ref 0–44)
AST: 17 U/L (ref 15–41)
Albumin: 3.3 g/dL — ABNORMAL LOW (ref 3.5–5.0)
Alkaline Phosphatase: 92 U/L (ref 38–126)
Anion gap: 13 (ref 5–15)
BUN: 16 mg/dL (ref 6–20)
CO2: 27 mmol/L (ref 22–32)
Calcium: 9.5 mg/dL (ref 8.9–10.3)
Chloride: 101 mmol/L (ref 98–111)
Creatinine: 0.99 mg/dL (ref 0.44–1.00)
GFR, Est AFR Am: >60 mL/min (ref >60)
GFR, Estimated: >60 mL/min (ref >60)
Glucose, Bld: 129 mg/dL — ABNORMAL HIGH (ref 70–99)
Potassium: 4.1 mmol/L (ref 3.5–5.1)
Sodium: 141 mmol/L (ref 135–145)
Total Bilirubin: 0.2 mg/dL — ABNORMAL LOW (ref 0.3–1.2)
Total Protein: 8 g/dL (ref 6.5–8.1)

## 2019-02-11 MED ORDER — SODIUM CHLORIDE 0.9 % IV SOLN
Freq: Once | INTRAVENOUS | Status: AC
  Administered 2019-02-11: 10:00:00 via INTRAVENOUS
  Filled 2019-02-11: qty 250

## 2019-02-11 MED ORDER — SODIUM CHLORIDE 0.9 % IV SOLN
10.9000 mg/kg | Freq: Once | INTRAVENOUS | Status: AC
  Administered 2019-02-11: 1120 mg via INTRAVENOUS
  Filled 2019-02-11: qty 20

## 2019-02-11 NOTE — Patient Instructions (Signed)
[facility] Discharge Instructions for Patients Receiving Chemotherapy  Today you received the following chemotherapy agents: Tecentriq  To help prevent nausea and vomiting after your treatment, we encourage you to take your nausea medication as directed.   If you develop nausea and vomiting that is not controlled by your nausea medication, call the clinic.   BELOW ARE SYMPTOMS THAT SHOULD BE REPORTED IMMEDIATELY:  *FEVER GREATER THAN 100.5 F  *CHILLS WITH OR WITHOUT FEVER  NAUSEA AND VOMITING THAT IS NOT CONTROLLED WITH YOUR NAUSEA MEDICATION  *UNUSUAL SHORTNESS OF BREATH  *UNUSUAL BRUISING OR BLEEDING  TENDERNESS IN MOUTH AND THROAT WITH OR WITHOUT PRESENCE OF ULCERS  *URINARY PROBLEMS  *BOWEL PROBLEMS  UNUSUAL RASH Items with * indicate a potential emergency and should be followed up as soon as possible.  Feel free to call the clinic should you have any questions or concerns. The clinic phone number is [phone].  Please show the CHEMO ALERT CARD at check-in to the Emergency Department and triage nurse.   

## 2019-02-11 NOTE — Progress Notes (Signed)
[place] OFFICE PROGRESS NOTE  [name], MD [place] [address]  DIAGNOSIS: Stage IIIB (T3, N2, M0) non-small cell lung cancer, squamous cell carcinoma presented with large left lower lobe lung mass in addition to mediastinal lymphadenopathy diagnosed in October 2019. PDL 1 expression is negative  PRIOR THERAPY: Concurrent chemoradiation with weekly carboplatin for AUC of 2 and paclitaxel 45 mg/M2. Status post 7cycles with partial response.  CURRENT THERAPY: Consolidation treatment with immunotherapy with Imfinzi 10 mg/KG every 2 weeks. First dose July 31, 2018.Status post14cycles.  INTERVAL HISTORY: Deborah [name] 63 y.o. female returns to the clinic for a follow-up visit.  The patient is feeling well today without any concerning complaints except for dry skin. She recently purchased a different moisturizer to help her dry skin. She also states that she occasionally has dry eyes in the right eye the morning when she wakes up due to issues with her eyelid in the morning being "folded".  She states that this resolves after massaging her eyelids.  She denies any visual blurring, photophobia, erythema, discharge, itchiness, or eye pain.   She is tolerating her treatment with Imfinzi fairly well besides the dry skin.  She denies any fevers, chills, night sweats, or weight loss.  She endorses her baseline shortness of breath and cough secondary to her COPD.  She is on 3 L of home oxygen. She is followed by Dr. Drue Stager from pulmonology but has not seen him in awhile since being diagnosed with lung cancer last year. She also has an albuterol inhaler which she uses in the mornings for her shortness of breath.  She denies any nausea, vomiting, diarrhea, or constipation.  She denies any headaches or visual changes.  She is here today for evaluation before starting cycle #15.  MEDICAL HISTORY: Past Medical History:  Diagnosis Date  . Anemia    as  teen  . Asthma   . CHF (congestive heart failure) ([facility])   . COPD, mild ([facility])   . Depression   . Diverticulitis   . Family history of anesthesia complication    vomiting  . GI bleeding   . Heart failure ([facility])    New onset 07/25/14  . Histoplasmosis    left eye  . Hyperkalemia   . Hypertension   . Obesity (BMI 30-39.9)   . Pneumonia    dx wtih pneumonia on 05/27/16- seen by [facility]   . PONV (postoperative nausea and vomiting)   . Restrictive lung disease   . Shortness of breath    with exertion   . Sleep apnea    mask and oxygen at nite for sleep at 2L   . Tobacco abuse   . Umbilical hernia     ALLERGIES:  has No Known Allergies.  MEDICATIONS:  Current Outpatient Medications  Medication Sig Dispense Refill  . acetaminophen (TYLENOL) 500 MG tablet Take 1,500 mg by mouth 2 (two) times daily as needed for moderate pain or headache.    . albuterol (PROVENTIL) (2.5 MG/3ML) 0.083% nebulizer solution USE 1 VIAL VIA NEBULIZER EVERY 3 HOURS AS NEEDED FOR WHEEZING OR SHORTNESS OF BREATH (Patient taking differently: Take 2.5 mg by nebulization every 3 (three) hours as needed for wheezing or shortness of breath. ) 1125 mL 1  . aspirin 81 MG chewable tablet Chew 1 tablet (81 mg total) by mouth daily. 30 tablet 1  . Cholecalciferol (D3 ADULT PO) Take 1,000 mg by mouth.    . diltiazem (  CARDIZEM CD) 240 MG 24 hr capsule Take 1 capsule (240 mg total) by mouth daily. 30 capsule 6  . furosemide (LASIX) 20 MG tablet TAKE 2 TABLETS EACH MORNING AND 1 TAB AT 3 PM 270 tablet 0  . ipratropium-albuterol (DUONEB) 0.5-2.5 (3) MG/3ML SOLN USE 1 VIAL VIA NEBULIZER EVERY 6 HOURS AS NEEDED (Patient taking differently: Inhale 3 mLs into the lungs every 6 (six) hours as needed (shortness of breath or wheezing). ) 360 mL 2  . montelukast (SINGULAIR) 10 MG tablet TAKE 1 TABLET(10 MG) BY MOUTH AT BEDTIME 90 tablet 1  . OXYGEN Inhale 4 L/min into the lungs continuous.     . potassium chloride SA (KLOR-CON M20) 20  MEQ tablet TAKE 1/2 TABLET BY MOUTH DAILY 45 tablet 1  . prochlorperazine (COMPAZINE) 10 MG tablet TAKE 1 TABLET BY MOUTH EVERY 6 HOURS AS NEEDED FOR NAUSEA OR VOMITING. 30 tablet 0  . Spacer/Aero-Holding Chambers (AEROCHAMBER MV) inhaler Use as instructed 1 each 0  . VENTOLIN HFA 108 (90 Base) MCG/ACT inhaler INHALE 2 PUFFS BY MOUTH EVERY 6 HOURS AS NEEDED FOR WHEEZING OR SHORTNESS OF BREATH 18 g 5  . vitamin B-12 (CYANOCOBALAMIN) 500 MCG tablet Take 500 mcg by mouth daily.     No current facility-administered medications for this visit.    Facility-Administered Medications Ordered in Other Visits  Medication Dose Route Frequency Provider Last Rate Last Dose  . 0.9 %  sodium chloride infusion   Intravenous Once Curt Bears, MD      . durvalumab [facility]) 1,120 mg in sodium chloride 0.9 % 100 mL chemo infusion  10.9 mg/kg (Treatment Plan Recorded) Intravenous Once Curt Bears, MD        SURGICAL HISTORY:  Past Surgical History:  Procedure Laterality Date  . APPLICATION OF WOUND VAC  03/19/2013   Procedure: APPLICATION OF WOUND VAC;  Surgeon: Madilyn Hook, DO;  Location: [facility];  Service: General;;  . CESAREAN SECTION  04/12/85  . COLONOSCOPY WITH PROPOFOL N/A 06/13/2016   Procedure: COLONOSCOPY WITH PROPOFOL;  Surgeon: Jerene Bears, MD;  Location: [facility];  Service: Gastroenterology;  Laterality: N/A;  . ERCP N/A 02/15/2017   Procedure: ENDOSCOPIC RETROGRADE CHOLANGIOPANCREATOGRAPHY (ERCP);  Surgeon: [name], MD;  Location: [facility];  Service: Endoscopy;  Laterality: N/A;  . INSERTION OF MESH N/A 03/19/2013   Procedure: INSERTION OF MESH;  Surgeon: Madilyn Hook, DO;  Location: [facility];  Service: General;  Laterality: N/A;  . VENTRAL HERNIA REPAIR N/A 03/19/2013   Procedure:  OPEN VENTRAL HERNIA REPAIR WITH MESH AND APPLICATION OF WOUND VAC;  Surgeon: Madilyn Hook, DO;  Location: [facility];  Service: General;  Laterality: N/A;  . VIDEO BRONCHOSCOPY Bilateral 04/19/2018    Procedure: VIDEO BRONCHOSCOPY WITH FLUORO;  Surgeon: Rigoberto Noel, MD;  Location: [facility];  Service: Cardiopulmonary;  Laterality: Bilateral;    REVIEW OF SYSTEMS:   Review of Systems  Constitutional: Positive for fatigue. Negative for appetite change, chills, fever and unexpected weight change.  HENT: Negative for mouth sores, nosebleeds, sore throat and trouble swallowing.   Eyes: Positive for dry R eye in morning. Negative for redness of the eye, ocular pain, discharge, swelling, photophobia, visual changes, or icterus.  Respiratory: Positive for baseline cough and shortness of breath with exertion. Negative for hemoptysis and wheezing.   Cardiovascular: Negative for chest pain and leg swelling.  Gastrointestinal: Negative for abdominal pain, constipation, diarrhea, nausea and vomiting.  Genitourinary: Negative for bladder incontinence, difficulty urinating, dysuria, frequency  and hematuria.    Musculoskeletal: Negative for back pain, gait problem, neck pain and neck stiffness.  Skin: Positive for dry skin on extremities and face. Negative for rash Neurological: Negative for dizziness, extremity weakness, gait problem, headaches, light-headedness and seizures.  Hematological: Negative for adenopathy. Does not bruise/bleed easily.  Psychiatric/Behavioral: Negative for confusion, depression and sleep disturbance. The patient is not nervous/anxious.     PHYSICAL EXAMINATION:  Blood pressure 110/66, pulse 93, temperature 99.1 F (37.3 C), temperature source Oral, resp. rate 18, height 5\' 2"  (1.575 m), weight 243 lb (110.2 kg), SpO2 97 %.  ECOG PERFORMANCE STATUS: 1 - Symptomatic but completely ambulatory  Physical Exam  Constitutional: Oriented to person, place, and time and well-developed, well-nourished, and in no distress.  HENT:  Head: Normocephalic and atraumatic.  Mouth/Throat: Oropharynx is clear and moist. No oropharyngeal exudate.  Eyes: Conjunctivae are normal. Right eye  exhibits no discharge. Left eye exhibits no discharge. No scleral icterus. No pain with EOM. No visual blurring/changes. Neck: Normal range of motion. Neck supple.  Cardiovascular: Normal rate, regular rhythm, normal heart sounds and intact distal pulses.   Pulmonary/Chest: Wheezing noted. Effort normal and breath sounds normal. No respiratory distress. No rales.  Abdominal: Soft. Bowel sounds are normal. Exhibits no distension and no mass. There is no tenderness.  Musculoskeletal: Normal range of motion. Exhibits no edema.  Lymphadenopathy:    No cervical adenopathy.  Neurological: Alert and oriented to person, place, and time. Exhibits normal muscle tone. Gait normal. Coordination normal.  Skin: Skin is warm and dry. Dry flaky skin on arms and forehead. No rash noted. Not diaphoretic. No erythema. No pallor.  Psychiatric: Mood, memory and judgment normal.  Vitals reviewed.  LABORATORY DATA: Lab Results  Component Value Date   WBC 9.2 02/11/2019   HGB 12.7 02/11/2019   HCT 41.3 02/11/2019   MCV 92.0 02/11/2019   PLT 285 02/11/2019      Chemistry      Component Value Date/Time   NA 141 02/11/2019 0737   NA 143 11/23/2016 1504   K 4.1 02/11/2019 0737   CL 101 02/11/2019 0737   CO2 27 02/11/2019 0737   BUN 16 02/11/2019 0737   BUN 14 11/23/2016 1504   CREATININE 0.99 02/11/2019 0737      Component Value Date/Time   CALCIUM 9.5 02/11/2019 0737   ALKPHOS 92 02/11/2019 0737   AST 17 02/11/2019 0737   ALT 16 02/11/2019 0737   BILITOT 0.2 (L) 02/11/2019 0737       RADIOGRAPHIC STUDIES:  Ct Chest W Contrast  Result Date: 01/13/2019 CLINICAL DATA:  Lung cancer, chemotherapy and radiation therapy complete. Cough and shortness of breath. EXAM: CT CHEST WITH CONTRAST TECHNIQUE: Multidetector CT imaging of the chest was performed during intravenous contrast administration. CONTRAST:  85mL OMNIPAQUE IOHEXOL 300 MG/ML  SOLN COMPARISON:  10/21/2018. FINDINGS: Cardiovascular:  Atherosclerotic calcification of the aorta and coronary arteries. Heart is at the upper limits of normal in size. No pericardial effusion. Mediastinum/Nodes: No pathologically enlarged mediastinal, hilar or axillary lymph nodes. Esophagus is grossly unremarkable. Lungs/Pleura: Centrilobular emphysema. Scattered areas of peribronchovascular ground-glass and mild architectural distortion in the left upper lobe and superior segment left lower lobe are similar to minimally improved. There is a central area of volume loss in the left upper lobe (series 7, image 39) which is new but is likely due to mucoid impaction and atelectasis. New or increasing subpleural ground-glass and consolidation in the left lower lobe (83). Residual cystic  lesion in the superior segment left lower lobe represents the site of primary malignancy seen on 04/08/2018. Slight worsening and subpleural consolidation and ground-glass in the inferior posterior right lower lobe as well. No pleural fluid. Airway is unremarkable. Upper Abdomen: Liver appears slightly decreased in attenuation diffusely. Continued mild thickening of the body and fundus of the gallbladder. Common bile duct is mildly prominent, as before. Adrenal glands and visualized portions of the kidneys, spleen, pancreas, stomach and bowel are otherwise grossly unremarkable. No upper abdominal adenopathy. Musculoskeletal: Degenerative changes in the spine. No worrisome lytic or sclerotic lesions. IMPRESSION: 1. Waxing and waning areas of ground-glass and consolidation are seen primarily in the left lung, but in the right lower lobe as well, favoring an infectious/inflammatory etiology. There may be underlying changes of radiation therapy in the left hemithorax. 2. Liver appears steatotic. 3. Continued mild thickening of the body and fundus of the gallbladder, nonspecific. Continued attention on follow-up exams is suggested. 4. Aortic atherosclerosis (ICD10-170.0). Coronary artery  calcification. 5.  Emphysema (ICD10-J43.9). Electronically Signed   By: Lorin Picket M.D.   On: 01/13/2019 16:04     ASSESSMENT/PLAN:  This is a very pleasant 63 year old Caucasian female with stage IIIb non-small cell lung cancer, squamous cell carcinoma.  She presented with a large left lower lobe lung mass in addition to mediastinal lymphadenopathy.  She was diagnosed in October 2019.  Her PDL 1 expression is negative.  She underwent a course of concurrent chemoradiation with weekly carboplatin for an AUC of 2 and paclitaxel 45 mg/m.  She is status post 7 cycles.  She tolerated treatment well without any adverse side effects. She is currently undergoing consolidation immunotherapy with Imfinzi 10 mg/kg IV every 2 weeks.  She is status post 14 cycles and has been tolerating it well without any adverse side effects except for dry skin for which she uses lotion.  Labs were reviewed with the patient.  Recommend that she proceed with cycle #15 today scheduled.  We will see her back for follow-up visit in 2 weeks for evaluation before starting cycle #16.  She will continue using her home oxygen for her COPD. I encouraged the patient to follow up with her pulmonologist at the discussed interval for her hx of COPD and for her shortness of breath.   Regarding the patient's dry eyes, she will try to use artificial tears in the morning. I discussed worrisome eye symptoms that warrant immediate evaluation including eye pain, visual changes, erythema, pain with extraocular movement, or photophobia. She expressed understanding and she denied any of these symptoms at this time.   The patient was advised to call immediately if she has any concerning symptoms in the interval. The patient voices understanding of current disease status and treatment options and is in agreement with the current care plan. All questions were answered. The patient knows to call the clinic with any problems, questions or  concerns. We can certainly see the patient much sooner if necessary   Orders Placed This Encounter  Procedures  . TSH    Standing Status:   Standing    Number of Occurrences:   7    Standing Expiration Date:   02/11/2020     Terrill Wauters L Amariz Flamenco, PA-C 02/11/19

## 2019-02-12 ENCOUNTER — Ambulatory Visit: Payer: BLUE CROSS/BLUE SHIELD | Admitting: Internal Medicine

## 2019-02-12 ENCOUNTER — Other Ambulatory Visit: Payer: BLUE CROSS/BLUE SHIELD

## 2019-02-12 ENCOUNTER — Ambulatory Visit: Payer: BLUE CROSS/BLUE SHIELD

## 2019-02-25 ENCOUNTER — Other Ambulatory Visit: Payer: Self-pay

## 2019-02-25 ENCOUNTER — Telehealth: Payer: Self-pay | Admitting: Internal Medicine

## 2019-02-25 ENCOUNTER — Inpatient Hospital Stay: Payer: BLUE CROSS/BLUE SHIELD

## 2019-02-25 ENCOUNTER — Inpatient Hospital Stay: Payer: BLUE CROSS/BLUE SHIELD | Attending: Internal Medicine | Admitting: Internal Medicine

## 2019-02-25 ENCOUNTER — Encounter: Payer: Self-pay | Admitting: Internal Medicine

## 2019-02-25 VITALS — BP 108/72 | HR 116 | Temp 98.3°F | Resp 18 | Ht 62.0 in | Wt 239.6 lb

## 2019-02-25 VITALS — HR 98

## 2019-02-25 DIAGNOSIS — C3432 Malignant neoplasm of lower lobe, left bronchus or lung: Secondary | ICD-10-CM

## 2019-02-25 DIAGNOSIS — E669 Obesity, unspecified: Secondary | ICD-10-CM | POA: Diagnosis not present

## 2019-02-25 DIAGNOSIS — Z5112 Encounter for antineoplastic immunotherapy: Secondary | ICD-10-CM

## 2019-02-25 DIAGNOSIS — I509 Heart failure, unspecified: Secondary | ICD-10-CM | POA: Diagnosis not present

## 2019-02-25 DIAGNOSIS — Z79899 Other long term (current) drug therapy: Secondary | ICD-10-CM | POA: Insufficient documentation

## 2019-02-25 DIAGNOSIS — I110 Hypertensive heart disease with heart failure: Secondary | ICD-10-CM | POA: Insufficient documentation

## 2019-02-25 LAB — CMP (CANCER CENTER ONLY)
ALT: 21 U/L (ref 0–44)
AST: 21 U/L (ref 15–41)
Albumin: 3.5 g/dL (ref 3.5–5.0)
Alkaline Phosphatase: 83 U/L (ref 38–126)
Anion gap: 16 — ABNORMAL HIGH (ref 5–15)
BUN: 19 mg/dL (ref 6–20)
CO2: 25 mmol/L (ref 22–32)
Calcium: 9.2 mg/dL (ref 8.9–10.3)
Chloride: 99 mmol/L (ref 98–111)
Creatinine: 1.05 mg/dL — ABNORMAL HIGH (ref 0.44–1.00)
GFR, Est AFR Am: >60 mL/min (ref >60)
GFR, Estimated: 58 mL/min — ABNORMAL LOW (ref >60)
Glucose, Bld: 145 mg/dL — ABNORMAL HIGH (ref 70–99)
Potassium: 3.8 mmol/L (ref 3.5–5.1)
Sodium: 140 mmol/L (ref 135–145)
Total Bilirubin: 0.2 mg/dL — ABNORMAL LOW (ref 0.3–1.2)
Total Protein: 8.1 g/dL (ref 6.5–8.1)

## 2019-02-25 LAB — CBC WITH DIFFERENTIAL (CANCER CENTER ONLY)
Abs Immature Granulocytes: 0.05 10*3/uL (ref 0.00–0.07)
Basophils Absolute: 0 10*3/uL (ref 0.0–0.1)
Basophils Relative: 0 %
Eosinophils Absolute: 0.2 10*3/uL (ref 0.0–0.5)
Eosinophils Relative: 2 %
HCT: 41.6 % (ref 36.0–46.0)
Hemoglobin: 12.8 g/dL (ref 12.0–15.0)
Immature Granulocytes: 1 %
Lymphocytes Relative: 12 %
Lymphs Abs: 1.2 10*3/uL (ref 0.7–4.0)
MCH: 28.4 pg (ref 26.0–34.0)
MCHC: 30.8 g/dL (ref 30.0–36.0)
MCV: 92.2 fL (ref 80.0–100.0)
Monocytes Absolute: 0.7 10*3/uL (ref 0.1–1.0)
Monocytes Relative: 7 %
Neutro Abs: 8 10*3/uL — ABNORMAL HIGH (ref 1.7–7.7)
Neutrophils Relative %: 78 %
Platelet Count: 313 10*3/uL (ref 150–400)
RBC: 4.51 MIL/uL (ref 3.87–5.11)
RDW: 13.6 % (ref 11.5–15.5)
WBC Count: 10.2 10*3/uL (ref 4.0–10.5)
nRBC: 0 % (ref 0.0–0.2)

## 2019-02-25 LAB — TSH: TSH: 1.324 u[IU]/mL (ref 0.308–3.960)

## 2019-02-25 MED ORDER — SODIUM CHLORIDE 0.9 % IV SOLN
10.9000 mg/kg | Freq: Once | INTRAVENOUS | Status: AC
  Administered 2019-02-25: 1120 mg via INTRAVENOUS
  Filled 2019-02-25: qty 2.4

## 2019-02-25 MED ORDER — SODIUM CHLORIDE 0.9 % IV SOLN
Freq: Once | INTRAVENOUS | Status: AC
  Administered 2019-02-25: 09:00:00 via INTRAVENOUS
  Filled 2019-02-25: qty 250

## 2019-02-25 NOTE — Progress Notes (Signed)
[facility] [phone]   [phone]  OFFICE PROGRESS NOTE  [name], MD [place] [address]  DIAGNOSIS: stage IIIB (T3, N2, M0) non-small cell lung cancer, squamous cell carcinoma presented with large left lower lobe lung mass in addition to mediastinal lymphadenopathy diagnosed in October 2019. PDL 1 expression is negative  PRIOR THERAPY: Concurrent chemoradiation with weekly carboplatin for AUC of 2 and paclitaxel 45 mg/M2.  Status post 7 cycles with partial response.Marland Kitchen  CURRENT THERAPY: Consolidation treatment with immunotherapy with Imfinzi 10 mg/KG every 2 weeks.  First dose July 31, 2018.  Status post 15 cycles.  INTERVAL HISTORY: Deborah [name] 63 y.o. female returns to the clinic today for follow-up visit.  The patient is feeling fine today with no concerning complaints except for the baseline shortness of breath and she is still on home oxygen.  She denied having any fever or chills.  She has no nausea, vomiting, diarrhea or constipation.  She denied having any headache or visual changes.  She has no weight loss or night sweats.  The patient continues to tolerate her treatment with Imfinzi fairly well.  She is here today for evaluation before starting cycle #16.   MEDICAL HISTORY: Past Medical History:  Diagnosis Date  . Anemia    as teen  . Asthma   . CHF (congestive heart failure) ([place])   . COPD, mild ([facility])   . Depression   . Diverticulitis   . Family history of anesthesia complication    vomiting  . GI bleeding   . Heart failure ([facility])    New onset 07/25/14  . Histoplasmosis    left eye  . Hyperkalemia   . Hypertension   . Obesity (BMI 30-39.9)   . Pneumonia    dx wtih pneumonia on 05/27/16- seen by [facility]   . PONV (postoperative nausea and vomiting)   . Restrictive lung disease   . Shortness of breath    with exertion   . Sleep apnea    mask and oxygen at nite for sleep at 2L   .  Tobacco abuse   . Umbilical hernia     ALLERGIES:  has No Known Allergies.  MEDICATIONS:  Current Outpatient Medications  Medication Sig Dispense Refill  . acetaminophen (TYLENOL) 500 MG tablet Take 1,500 mg by mouth 2 (two) times daily as needed for moderate pain or headache.    . albuterol (PROVENTIL) (2.5 MG/3ML) 0.083% nebulizer solution USE 1 VIAL VIA NEBULIZER EVERY 3 HOURS AS NEEDED FOR WHEEZING OR SHORTNESS OF BREATH (Patient taking differently: Take 2.5 mg by nebulization every 3 (three) hours as needed for wheezing or shortness of breath. ) 1125 mL 1  . aspirin 81 MG chewable tablet Chew 1 tablet (81 mg total) by mouth daily. 30 tablet 1  . Cholecalciferol (D3 ADULT PO) Take 1,000 mg by mouth.    . diltiazem (CARDIZEM CD) 240 MG 24 hr capsule Take 1 capsule (240 mg total) by mouth daily. 30 capsule 6  . furosemide (LASIX) 20 MG tablet TAKE 2 TABLETS EACH MORNING AND 1 TAB AT 3 PM 270 tablet 0  . ipratropium-albuterol (DUONEB) 0.5-2.5 (3) MG/3ML SOLN USE 1 VIAL VIA NEBULIZER EVERY 6 HOURS AS NEEDED (Patient taking differently: Inhale 3 mLs into the lungs every 6 (six) hours as needed (shortness of breath or wheezing). ) 360 mL 2  . montelukast (SINGULAIR) 10 MG tablet TAKE 1 TABLET(10 MG) BY  MOUTH AT BEDTIME 90 tablet 1  . OXYGEN Inhale 4 L/min into the lungs continuous.     . potassium chloride SA (KLOR-CON M20) 20 MEQ tablet TAKE 1/2 TABLET BY MOUTH DAILY 45 tablet 1  . prochlorperazine (COMPAZINE) 10 MG tablet TAKE 1 TABLET BY MOUTH EVERY 6 HOURS AS NEEDED FOR NAUSEA OR VOMITING. 30 tablet 0  . Spacer/Aero-Holding Chambers (AEROCHAMBER MV) inhaler Use as instructed 1 each 0  . VENTOLIN HFA 108 (90 Base) MCG/ACT inhaler INHALE 2 PUFFS BY MOUTH EVERY 6 HOURS AS NEEDED FOR WHEEZING OR SHORTNESS OF BREATH 18 g 5  . vitamin B-12 (CYANOCOBALAMIN) 500 MCG tablet Take 500 mcg by mouth daily.     No current facility-administered medications for this visit.     SURGICAL HISTORY:  Past  Surgical History:  Procedure Laterality Date  . APPLICATION OF WOUND VAC  03/19/2013   Procedure: APPLICATION OF WOUND VAC;  Surgeon: Madilyn Hook, DO;  Location: [facility];  Service: General;;  . CESAREAN SECTION  04/12/85  . COLONOSCOPY WITH PROPOFOL N/A 06/13/2016   Procedure: COLONOSCOPY WITH PROPOFOL;  Surgeon: Jerene Bears, MD;  Location: [facility];  Service: Gastroenterology;  Laterality: N/A;  . ERCP N/A 02/15/2017   Procedure: ENDOSCOPIC RETROGRADE CHOLANGIOPANCREATOGRAPHY (ERCP);  Surgeon: [name], MD;  Location: [facility];  Service: Endoscopy;  Laterality: N/A;  . INSERTION OF MESH N/A 03/19/2013   Procedure: INSERTION OF MESH;  Surgeon: Madilyn Hook, DO;  Location: [facility];  Service: General;  Laterality: N/A;  . VENTRAL HERNIA REPAIR N/A 03/19/2013   Procedure:  OPEN VENTRAL HERNIA REPAIR WITH MESH AND APPLICATION OF WOUND VAC;  Surgeon: Madilyn Hook, DO;  Location: [facility];  Service: General;  Laterality: N/A;  . VIDEO BRONCHOSCOPY Bilateral 04/19/2018   Procedure: VIDEO BRONCHOSCOPY WITH FLUORO;  Surgeon: Rigoberto Noel, MD;  Location: [facility];  Service: Cardiopulmonary;  Laterality: Bilateral;    REVIEW OF SYSTEMS:  A comprehensive review of systems was negative except for: Respiratory: positive for dyspnea on exertion   PHYSICAL EXAMINATION: General appearance: alert, cooperative and no distress Head: Normocephalic, without obvious abnormality, atraumatic Neck: no adenopathy, no JVD, supple, symmetrical, trachea midline and thyroid not enlarged, symmetric, no tenderness/mass/nodules Lymph nodes: Cervical, supraclavicular, and axillary nodes normal. Resp: clear to auscultation bilaterally Back: symmetric, no curvature. ROM normal. No CVA tenderness. Cardio: regular rate and rhythm, S1, S2 normal, no murmur, click, rub or gallop GI: soft, non-tender; bowel sounds normal; no masses,  no organomegaly Extremities: extremities normal, atraumatic, no cyanosis or edema   ECOG PERFORMANCE STATUS: 1 - Symptomatic but completely ambulatory  Blood pressure 108/72, pulse (!) 116, temperature 98.3 F (36.8 C), temperature source Temporal, resp. rate 18, height 5\' 2"  (1.575 m), weight 239 lb 9 oz (108.7 kg), SpO2 97 %.  LABORATORY DATA: Lab Results  Component Value Date   WBC 10.2 02/25/2019   HGB 12.8 02/25/2019   HCT 41.6 02/25/2019   MCV 92.2 02/25/2019   PLT 313 02/25/2019      Chemistry      Component Value Date/Time   NA 141 02/11/2019 0737   NA 143 11/23/2016 1504   K 4.1 02/11/2019 0737   CL 101 02/11/2019 0737   CO2 27 02/11/2019 0737   BUN 16 02/11/2019 0737   BUN 14 11/23/2016 1504   CREATININE 0.99 02/11/2019 0737      Component Value Date/Time   CALCIUM 9.5 02/11/2019 0737   ALKPHOS 92 02/11/2019 0737   AST  17 02/11/2019 0737   ALT 16 02/11/2019 0737   BILITOT 0.2 (L) 02/11/2019 0737       RADIOGRAPHIC STUDIES: No results found.  ASSESSMENT AND PLAN: This is a very pleasant 63 years old white female with stage IIIb non-small cell lung cancer, squamous cell carcinoma. She underwent a course of concurrent chemoradiation with weekly carboplatin and paclitaxel, status post 7 cycles.  She tolerated this treatment well with no concerning adverse effect except for fatigue. She is currently undergoing consolidation treatment with immunotherapy with Imfinzi status post 15 cycles. The patient continues to tolerate her treatment well with no concerning adverse effects. I recommended for her to proceed with cycle #16 today as planned. We will see her back for follow-up visit in 2 weeks for evaluation before starting cycle #17. She was advised to call immediately if she has any concerning symptoms in the interval. The patient voices understanding of current disease status and treatment options and is in agreement with the current care plan.  All questions were answered. The patient knows to call the clinic with any problems, questions or  concerns. We can certainly see the patient much sooner if necessary.  Disclaimer: This note was dictated with voice recognition software. Similar sounding words can inadvertently be transcribed and may not be corrected upon review.

## 2019-02-25 NOTE — Telephone Encounter (Signed)
Scheduled appt per 8/11 los - added additional appts - pt to get an updated schedule next visit.

## 2019-02-25 NOTE — Patient Instructions (Signed)
Coronavirus (COVID-19) Are you at risk?  Are you at risk for the Coronavirus (COVID-19)?  To be considered HIGH RISK for Coronavirus (COVID-19), you have to meet the following criteria:  . Traveled to [place], [place], [place], [place] or [place]; or in the [place] to [place], [place], [place], or [place]; and have fever, cough, and shortness of breath within the last 2 weeks of travel OR . Been in close contact with a person diagnosed with COVID-19 within the last 2 weeks and have fever, cough, and shortness of breath . IF YOU DO NOT MEET THESE CRITERIA, YOU ARE CONSIDERED LOW RISK FOR COVID-19.  What to do if you are HIGH RISK for COVID-19?  Marland Kitchen If you are having a medical emergency, call 911. . Seek medical care right away. Before you go to a doctor's office, urgent care or emergency department, call ahead and tell them about your recent travel, contact with someone diagnosed with COVID-19, and your symptoms. You should receive instructions from your physician's office regarding next steps of care.  . When you arrive at healthcare provider, tell the healthcare staff immediately you have returned from visiting [place], [place], [place], [place] or [place]; or traveled in the [place] to [place], [place], [place], or [place]; in the last two weeks or you have been in close contact with a person diagnosed with COVID-19 in the last 2 weeks.   . Tell the health care staff about your symptoms: fever, cough and shortness of breath. . After you have been seen by a medical provider, you will be either: o Tested for (COVID-19) and discharged home on quarantine except to seek medical care if symptoms worsen, and asked to  - Stay home and avoid contact with others until you get your results (4-5 days)  - Avoid travel on public transportation if possible (such as bus, train, or airplane) or o Sent to the Emergency Department by EMS for evaluation, COVID-19 testing, and possible  admission depending on your condition and test results.  What to do if you are LOW RISK for COVID-19?  Reduce your risk of any infection by using the same precautions used for avoiding the common cold or flu:  Marland Kitchen Wash your hands often with soap and warm water for at least 20 seconds.  If soap and water are not readily available, use an alcohol-based hand sanitizer with at least 60% alcohol.  . If coughing or sneezing, cover your mouth and nose by coughing or sneezing into the elbow areas of your shirt or coat, into a tissue or into your sleeve (not your hands). . Avoid shaking hands with others and consider head nods or verbal greetings only. . Avoid touching your eyes, nose, or mouth with unwashed hands.  . Avoid close contact with people who are sick. . Avoid places or events with large numbers of people in one location, like concerts or sporting events. . Carefully consider travel plans you have or are making. . If you are planning any travel outside or inside the [place], visit the CDC's Travelers' Health webpage for the latest health notices. . If you have some symptoms but not all symptoms, continue to monitor at home and seek medical attention if your symptoms worsen. . If you are having a medical emergency, call 911.   [place] / e-Visit: [email or web address]         [facility]: [place]  [facility]gent [facility]: [facility]: [place] Discharge Instructions for Patients Receiving Chemotherapy  Today you received the following chemotherapy agents Imfinzi  To help prevent nausea and vomiting after your treatment, we encourage you to take your nausea medication as directed.    If you develop nausea and vomiting that is not controlled by your nausea medication, call the clinic.   BELOW ARE  SYMPTOMS THAT SHOULD BE REPORTED IMMEDIATELY:  *FEVER GREATER THAN 100.5 F  *CHILLS WITH OR WITHOUT FEVER  NAUSEA AND VOMITING THAT IS NOT CONTROLLED WITH YOUR NAUSEA MEDICATION  *UNUSUAL SHORTNESS OF BREATH  *UNUSUAL BRUISING OR BLEEDING  TENDERNESS IN MOUTH AND THROAT WITH OR WITHOUT PRESENCE OF ULCERS  *URINARY PROBLEMS  *BOWEL PROBLEMS  UNUSUAL RASH Items with * indicate a potential emergency and should be followed up as soon as possible.  Feel free to call the clinic should you have any questions or concerns. The clinic phone number is [phone].  Please show the [place] at check-in to the Emergency Department and triage nurse.

## 2019-03-11 ENCOUNTER — Other Ambulatory Visit: Payer: Self-pay

## 2019-03-11 ENCOUNTER — Inpatient Hospital Stay: Payer: BLUE CROSS/BLUE SHIELD

## 2019-03-11 ENCOUNTER — Inpatient Hospital Stay (HOSPITAL_BASED_OUTPATIENT_CLINIC_OR_DEPARTMENT_OTHER): Payer: BLUE CROSS/BLUE SHIELD | Admitting: Physician Assistant

## 2019-03-11 VITALS — BP 119/79 | HR 55 | Temp 98.5°F | Resp 18 | Ht 62.0 in | Wt 239.6 lb

## 2019-03-11 DIAGNOSIS — C3432 Malignant neoplasm of lower lobe, left bronchus or lung: Secondary | ICD-10-CM

## 2019-03-11 DIAGNOSIS — Z79899 Other long term (current) drug therapy: Secondary | ICD-10-CM | POA: Diagnosis not present

## 2019-03-11 DIAGNOSIS — I110 Hypertensive heart disease with heart failure: Secondary | ICD-10-CM | POA: Diagnosis not present

## 2019-03-11 DIAGNOSIS — Z5112 Encounter for antineoplastic immunotherapy: Secondary | ICD-10-CM

## 2019-03-11 DIAGNOSIS — E669 Obesity, unspecified: Secondary | ICD-10-CM | POA: Diagnosis not present

## 2019-03-11 DIAGNOSIS — I509 Heart failure, unspecified: Secondary | ICD-10-CM | POA: Diagnosis not present

## 2019-03-11 LAB — CBC WITH DIFFERENTIAL (CANCER CENTER ONLY)
Abs Immature Granulocytes: 0.03 10*3/uL (ref 0.00–0.07)
Basophils Absolute: 0 10*3/uL (ref 0.0–0.1)
Basophils Relative: 1 %
Eosinophils Absolute: 0.2 10*3/uL (ref 0.0–0.5)
Eosinophils Relative: 3 %
HCT: 41.5 % (ref 36.0–46.0)
Hemoglobin: 13 g/dL (ref 12.0–15.0)
Immature Granulocytes: 0 %
Lymphocytes Relative: 13 %
Lymphs Abs: 1 10*3/uL (ref 0.7–4.0)
MCH: 28.6 pg (ref 26.0–34.0)
MCHC: 31.3 g/dL (ref 30.0–36.0)
MCV: 91.4 fL (ref 80.0–100.0)
Monocytes Absolute: 0.6 10*3/uL (ref 0.1–1.0)
Monocytes Relative: 8 %
Neutro Abs: 6.1 10*3/uL (ref 1.7–7.7)
Neutrophils Relative %: 75 %
Platelet Count: 284 10*3/uL (ref 150–400)
RBC: 4.54 MIL/uL (ref 3.87–5.11)
RDW: 13.9 % (ref 11.5–15.5)
WBC Count: 8 10*3/uL (ref 4.0–10.5)
nRBC: 0 % (ref 0.0–0.2)

## 2019-03-11 LAB — CMP (CANCER CENTER ONLY)
ALT: 21 U/L (ref 0–44)
AST: 18 U/L (ref 15–41)
Albumin: 3.4 g/dL — ABNORMAL LOW (ref 3.5–5.0)
Alkaline Phosphatase: 96 U/L (ref 38–126)
Anion gap: 12 (ref 5–15)
BUN: 19 mg/dL (ref 6–20)
CO2: 26 mmol/L (ref 22–32)
Calcium: 9.6 mg/dL (ref 8.9–10.3)
Chloride: 102 mmol/L (ref 98–111)
Creatinine: 1.04 mg/dL — ABNORMAL HIGH (ref 0.44–1.00)
GFR, Est AFR Am: >60 mL/min (ref >60)
GFR, Estimated: 58 mL/min — ABNORMAL LOW (ref >60)
Glucose, Bld: 115 mg/dL — ABNORMAL HIGH (ref 70–99)
Potassium: 4 mmol/L (ref 3.5–5.1)
Sodium: 140 mmol/L (ref 135–145)
Total Bilirubin: 0.3 mg/dL (ref 0.3–1.2)
Total Protein: 8 g/dL (ref 6.5–8.1)

## 2019-03-11 MED ORDER — SODIUM CHLORIDE 0.9 % IV SOLN
10.9000 mg/kg | Freq: Once | INTRAVENOUS | Status: AC
  Administered 2019-03-11: 1120 mg via INTRAVENOUS
  Filled 2019-03-11: qty 20

## 2019-03-11 MED ORDER — SODIUM CHLORIDE 0.9 % IV SOLN
Freq: Once | INTRAVENOUS | Status: AC
  Administered 2019-03-11: 12:00:00 via INTRAVENOUS
  Filled 2019-03-11: qty 250

## 2019-03-11 NOTE — Progress Notes (Signed)
[facility] OFFICE PROGRESS NOTE  Deborah Passy, MD [place] [address]  DIAGNOSIS: Stage IIIB (T3, N2, M0) non-small cell lung cancer, squamous cell carcinoma presented with large left lower lobe lung mass in addition to mediastinal lymphadenopathy diagnosed in October 2019. PDL 1 expression is negative  PRIOR THERAPY: Concurrent chemoradiation with weekly carboplatin for AUC of 2 and paclitaxel 45 mg/M2. Status post 7cycles with partial response.  CURRENT THERAPY: Consolidation treatment with immunotherapy with Imfinzi 10 mg/KG every 2 weeks. First dose July 31, 2018.Status post16cycles.  INTERVAL HISTORY: Deborah [name] 63 y.o. female returns to the clinic for a follow up visit. The patient is feeling well today without any concerning complaints except for her baseline shortness of breath and cough. She is on 3L of oxygen via nasal cannula. The patient continues to tolerate treatment with imfinzi well without any adverse side effects. Denies any fever, chills, night sweats, or weight loss. Denies any chest pain or hemoptysis. Denies any nausea, vomiting, diarrhea, or constipation. Denies any headache or visual changes. She does report dry eyes but recently saw her eye doctor for this concern who recommended eyedrops/ointment. Denies any rashes but does report dry flaky skin. She uses lotion for this concern. The patient is here today for evaluation prior to starting cycle #17   MEDICAL HISTORY: Past Medical History:  Diagnosis Date  . Anemia    as teen  . Asthma   . CHF (congestive heart failure) ([facility])   . COPD, mild ([facility])   . Depression   . Diverticulitis   . Family history of anesthesia complication    vomiting  . GI bleeding   . Heart failure ([facility])    New onset 07/25/14  . Histoplasmosis    left eye  . Hyperkalemia   . Hypertension   . Obesity (BMI 30-39.9)   . Pneumonia    dx wtih pneumonia on 05/27/16- seen by [facility]    . PONV (postoperative nausea and vomiting)   . Restrictive lung disease   . Shortness of breath    with exertion   . Sleep apnea    mask and oxygen at nite for sleep at 2L   . Tobacco abuse   . Umbilical hernia     ALLERGIES:  has No Known Allergies.  MEDICATIONS:  Current Outpatient Medications  Medication Sig Dispense Refill  . acetaminophen (TYLENOL) 500 MG tablet Take 1,500 mg by mouth 2 (two) times daily as needed for moderate pain or headache.    . albuterol (PROVENTIL) (2.5 MG/3ML) 0.083% nebulizer solution USE 1 VIAL VIA NEBULIZER EVERY 3 HOURS AS NEEDED FOR WHEEZING OR SHORTNESS OF BREATH (Patient taking differently: Take 2.5 mg by nebulization every 3 (three) hours as needed for wheezing or shortness of breath. ) 1125 mL 1  . aspirin 81 MG chewable tablet Chew 1 tablet (81 mg total) by mouth daily. 30 tablet 1  . Cholecalciferol (D3 ADULT PO) Take 1,000 mg by mouth.    . diltiazem (CARDIZEM CD) 240 MG 24 hr capsule Take 1 capsule (240 mg total) by mouth daily. 30 capsule 6  . furosemide (LASIX) 20 MG tablet TAKE 2 TABLETS EACH MORNING AND 1 TAB AT 3 PM 270 tablet 0  . ipratropium-albuterol (DUONEB) 0.5-2.5 (3) MG/3ML SOLN USE 1 VIAL VIA NEBULIZER EVERY 6 HOURS AS NEEDED (Patient taking differently: Inhale 3 mLs into the lungs every 6 (six) hours as needed (shortness of breath or wheezing). ) 360  mL 2  . montelukast (SINGULAIR) 10 MG tablet TAKE 1 TABLET(10 MG) BY MOUTH AT BEDTIME 90 tablet 1  . OXYGEN Inhale 4 L/min into the lungs continuous.     . potassium chloride SA (KLOR-CON M20) 20 MEQ tablet TAKE 1/2 TABLET BY MOUTH DAILY 45 tablet 1  . prochlorperazine (COMPAZINE) 10 MG tablet TAKE 1 TABLET BY MOUTH EVERY 6 HOURS AS NEEDED FOR NAUSEA OR VOMITING. 30 tablet 0  . Spacer/Aero-Holding Chambers (AEROCHAMBER MV) inhaler Use as instructed 1 each 0  . VENTOLIN HFA 108 (90 Base) MCG/ACT inhaler INHALE 2 PUFFS BY MOUTH EVERY 6 HOURS AS NEEDED FOR WHEEZING OR SHORTNESS OF BREATH  18 g 5  . vitamin B-12 (CYANOCOBALAMIN) 500 MCG tablet Take 500 mcg by mouth daily.     No current facility-administered medications for this visit.    Facility-Administered Medications Ordered in Other Visits  Medication Dose Route Frequency Provider Last Rate Last Dose  . durvalumab (IMFINZI) 1,120 mg in sodium chloride 0.9 % 100 mL chemo infusion  10.9 mg/kg (Treatment Plan Recorded) Intravenous Once Curt Bears, MD 122 mL/hr at 03/11/19 1247 1,120 mg at 03/11/19 1247    SURGICAL HISTORY:  Past Surgical History:  Procedure Laterality Date  . APPLICATION OF WOUND VAC  03/19/2013   Procedure: APPLICATION OF WOUND VAC;  Surgeon: Madilyn Hook, DO;  Location: [facility];  Service: General;;  . CESAREAN SECTION  04/12/85  . COLONOSCOPY WITH PROPOFOL N/A 06/13/2016   Procedure: COLONOSCOPY WITH PROPOFOL;  Surgeon: Jerene Bears, MD;  Location: [facility];  Service: Gastroenterology;  Laterality: N/A;  . ERCP N/A 02/15/2017   Procedure: ENDOSCOPIC RETROGRADE CHOLANGIOPANCREATOGRAPHY (ERCP);  Surgeon: [name], MD;  Location: [facility];  Service: Endoscopy;  Laterality: N/A;  . INSERTION OF MESH N/A 03/19/2013   Procedure: INSERTION OF MESH;  Surgeon: Madilyn Hook, DO;  Location: [facility];  Service: General;  Laterality: N/A;  . VENTRAL HERNIA REPAIR N/A 03/19/2013   Procedure:  OPEN VENTRAL HERNIA REPAIR WITH MESH AND APPLICATION OF WOUND VAC;  Surgeon: Madilyn Hook, DO;  Location: [facility];  Service: General;  Laterality: N/A;  . VIDEO BRONCHOSCOPY Bilateral 04/19/2018   Procedure: VIDEO BRONCHOSCOPY WITH FLUORO;  Surgeon: Rigoberto Noel, MD;  Location: [facility];  Service: Cardiopulmonary;  Laterality: Bilateral;    REVIEW OF SYSTEMS:   Review of Systems  Constitutional: Negative for appetite change, chills, fatigue, fever and unexpected weight change.  HENT: Negative for mouth sores, nosebleeds, sore throat and trouble swallowing.   Eyes: Negative for eye problems and icterus.   Respiratory:  Positive for baseline shortness of breath and cough. Negative for hemoptysis and wheezing.   Cardiovascular: Negative for chest pain and leg swelling.  Gastrointestinal: Negative for abdominal pain, constipation, diarrhea, nausea and vomiting.  Genitourinary: Negative for bladder incontinence, difficulty urinating, dysuria, frequency and hematuria.   Musculoskeletal: Negative for back pain, gait problem, neck pain and neck stiffness.  Skin: Positive for dry skin. Negative for itching and rash.  Neurological: Negative for dizziness, extremity weakness, gait problem, headaches, light-headedness and seizures.  Hematological: Negative for adenopathy. Does not bruise/bleed easily.  Psychiatric/Behavioral: Negative for confusion, depression and sleep disturbance. The patient is not nervous/anxious.     PHYSICAL EXAMINATION:  Blood pressure 119/79, pulse (!) 55, temperature 98.5 F (36.9 C), temperature source Oral, resp. rate 18, height 5\' 2"  (1.575 m), weight 239 lb 9.6 oz (108.7 kg), SpO2 94 %.  ECOG PERFORMANCE STATUS: 1 - Symptomatic but completely ambulatory  Physical Exam  Constitutional: Oriented to person, place, and time and well-developed, well-nourished, and in no distress. On oxygen via nasal cannula HENT:  Head: Normocephalic and atraumatic.  Mouth/Throat: Oropharynx is clear and moist. No oropharyngeal exudate.  Eyes: Conjunctivae are normal. Right eye exhibits no discharge. Left eye exhibits no discharge. No scleral icterus.  Neck: Normal range of motion. Neck supple.  Cardiovascular: Normal rate, regular rhythm, normal heart sounds and intact distal pulses.   Pulmonary/Chest: Effort normal and breath sounds normal. No respiratory distress. No wheezes. No rales.  Abdominal: Soft. Bowel sounds are normal. Exhibits no distension and no mass. There is no tenderness.  Musculoskeletal: Normal range of motion. Exhibits no edema.  Lymphadenopathy:    No cervical  adenopathy.  Neurological: Alert and oriented to person, place, and time. Exhibits normal muscle tone. Gait normal. Coordination normal.  Skin: Skin is warm and dry. No rash noted. Not diaphoretic. No erythema. No pallor.  Psychiatric: Mood, memory and judgment normal.  Vitals reviewed.  LABORATORY DATA: Lab Results  Component Value Date   WBC 8.0 03/11/2019   HGB 13.0 03/11/2019   HCT 41.5 03/11/2019   MCV 91.4 03/11/2019   PLT 284 03/11/2019      Chemistry      Component Value Date/Time   NA 140 03/11/2019 1031   NA 143 11/23/2016 1504   K 4.0 03/11/2019 1031   CL 102 03/11/2019 1031   CO2 26 03/11/2019 1031   BUN 19 03/11/2019 1031   BUN 14 11/23/2016 1504   CREATININE 1.04 (H) 03/11/2019 1031      Component Value Date/Time   CALCIUM 9.6 03/11/2019 1031   ALKPHOS 96 03/11/2019 1031   AST 18 03/11/2019 1031   ALT 21 03/11/2019 1031   BILITOT 0.3 03/11/2019 1031       RADIOGRAPHIC STUDIES:  No results found.   ASSESSMENT/PLAN:  This is a very pleasant 63 year old Caucasian female with stage IIIb non-small cell lung cancer, squamous cell carcinoma.  She presented with a large left lower lobe lung mass in addition to mediastinal lymphadenopathy.  She was diagnosed in October 2019.  Her PDL 1 expression is negative.  She underwent a course of concurrent chemoradiation with weekly carboplatin for an AUC of 2 and paclitaxel 45 mg/m.  She is status post 7 cycles.  She tolerated treatment well without any adverse side effects. She is currently undergoing consolidation immunotherapy with Imfinzi 10 mg/kg IV every 2 weeks.  She is status post 16 cycles and has been tolerating it well without any adverse side effects except for dry skin for which she uses lotion.  Labs were reviewed with the patient.  Recommend that she proceed with cycle #17 today scheduled.  We will see her back for follow-up visit in 2 weeks for evaluation before starting cycle #17.  She will use  lotion for her dry skin.   The patient was advised to call immediately if she has any concerning symptoms in the interval. The patient voices understanding of current disease status and treatment options and is in agreement with the current care plan. All questions were answered. The patient knows to call the clinic with any problems, questions or concerns. We can certainly see the patient much sooner if necessary  No orders of the defined types were placed in this encounter.    Cassandra L Heilingoetter, PA-C 03/11/19

## 2019-03-11 NOTE — Patient Instructions (Signed)
[facility] Discharge Instructions for Patients Receiving Chemotherapy  Today you received the following chemotherapy agents: Durvalumab (Imfinzi)   To help prevent nausea and vomiting after your treatment, we encourage you to take your nausea medication as directed.   If you develop nausea and vomiting that is not controlled by your nausea medication, call the clinic.   BELOW ARE SYMPTOMS THAT SHOULD BE REPORTED IMMEDIATELY:  *FEVER GREATER THAN 100.5 F  *CHILLS WITH OR WITHOUT FEVER  NAUSEA AND VOMITING THAT IS NOT CONTROLLED WITH YOUR NAUSEA MEDICATION  *UNUSUAL SHORTNESS OF BREATH  *UNUSUAL BRUISING OR BLEEDING  TENDERNESS IN MOUTH AND THROAT WITH OR WITHOUT PRESENCE OF ULCERS  *URINARY PROBLEMS  *BOWEL PROBLEMS  UNUSUAL RASH Items with * indicate a potential emergency and should be followed up as soon as possible.  Feel free to call the clinic should you have any questions or concerns. The clinic phone number is [phone].  Please show the CHEMO ALERT CARD at check-in to the Emergency Department and triage nurse.   

## 2019-03-25 ENCOUNTER — Inpatient Hospital Stay: Payer: BLUE CROSS/BLUE SHIELD | Attending: Internal Medicine

## 2019-03-25 ENCOUNTER — Other Ambulatory Visit: Payer: Self-pay

## 2019-03-25 ENCOUNTER — Inpatient Hospital Stay (HOSPITAL_BASED_OUTPATIENT_CLINIC_OR_DEPARTMENT_OTHER): Payer: BLUE CROSS/BLUE SHIELD | Admitting: Internal Medicine

## 2019-03-25 ENCOUNTER — Inpatient Hospital Stay: Payer: BLUE CROSS/BLUE SHIELD

## 2019-03-25 ENCOUNTER — Encounter: Payer: Self-pay | Admitting: Internal Medicine

## 2019-03-25 VITALS — BP 120/78 | HR 120 | Temp 98.9°F | Resp 18 | Ht 62.0 in | Wt 241.7 lb

## 2019-03-25 VITALS — HR 90

## 2019-03-25 DIAGNOSIS — I509 Heart failure, unspecified: Secondary | ICD-10-CM | POA: Diagnosis not present

## 2019-03-25 DIAGNOSIS — I2510 Atherosclerotic heart disease of native coronary artery without angina pectoris: Secondary | ICD-10-CM | POA: Insufficient documentation

## 2019-03-25 DIAGNOSIS — I110 Hypertensive heart disease with heart failure: Secondary | ICD-10-CM | POA: Insufficient documentation

## 2019-03-25 DIAGNOSIS — C3432 Malignant neoplasm of lower lobe, left bronchus or lung: Secondary | ICD-10-CM

## 2019-03-25 DIAGNOSIS — Z5112 Encounter for antineoplastic immunotherapy: Secondary | ICD-10-CM

## 2019-03-25 DIAGNOSIS — C3490 Malignant neoplasm of unspecified part of unspecified bronchus or lung: Secondary | ICD-10-CM | POA: Diagnosis not present

## 2019-03-25 DIAGNOSIS — Z79899 Other long term (current) drug therapy: Secondary | ICD-10-CM | POA: Insufficient documentation

## 2019-03-25 DIAGNOSIS — Z23 Encounter for immunization: Secondary | ICD-10-CM | POA: Diagnosis not present

## 2019-03-25 DIAGNOSIS — G4730 Sleep apnea, unspecified: Secondary | ICD-10-CM | POA: Insufficient documentation

## 2019-03-25 LAB — CBC WITH DIFFERENTIAL (CANCER CENTER ONLY)
Abs Immature Granulocytes: 0.04 10*3/uL (ref 0.00–0.07)
Basophils Absolute: 0 10*3/uL (ref 0.0–0.1)
Basophils Relative: 0 %
Eosinophils Absolute: 0.2 10*3/uL (ref 0.0–0.5)
Eosinophils Relative: 2 %
HCT: 40.6 % (ref 36.0–46.0)
Hemoglobin: 12.6 g/dL (ref 12.0–15.0)
Immature Granulocytes: 0 %
Lymphocytes Relative: 10 %
Lymphs Abs: 1 10*3/uL (ref 0.7–4.0)
MCH: 28.3 pg (ref 26.0–34.0)
MCHC: 31 g/dL (ref 30.0–36.0)
MCV: 91.2 fL (ref 80.0–100.0)
Monocytes Absolute: 0.8 10*3/uL (ref 0.1–1.0)
Monocytes Relative: 8 %
Neutro Abs: 7.9 10*3/uL — ABNORMAL HIGH (ref 1.7–7.7)
Neutrophils Relative %: 80 %
Platelet Count: 311 10*3/uL (ref 150–400)
RBC: 4.45 MIL/uL (ref 3.87–5.11)
RDW: 13.8 % (ref 11.5–15.5)
WBC Count: 9.9 10*3/uL (ref 4.0–10.5)
nRBC: 0 % (ref 0.0–0.2)

## 2019-03-25 LAB — CMP (CANCER CENTER ONLY)
ALT: 12 U/L (ref 0–44)
AST: 13 U/L — ABNORMAL LOW (ref 15–41)
Albumin: 3.6 g/dL (ref 3.5–5.0)
Alkaline Phosphatase: 94 U/L (ref 38–126)
Anion gap: 11 (ref 5–15)
BUN: 15 mg/dL (ref 6–20)
CO2: 29 mmol/L (ref 22–32)
Calcium: 9.5 mg/dL (ref 8.9–10.3)
Chloride: 101 mmol/L (ref 98–111)
Creatinine: 0.93 mg/dL (ref 0.44–1.00)
GFR, Est AFR Am: >60 mL/min (ref >60)
GFR, Estimated: >60 mL/min (ref >60)
Glucose, Bld: 91 mg/dL (ref 70–99)
Potassium: 4 mmol/L (ref 3.5–5.1)
Sodium: 141 mmol/L (ref 135–145)
Total Bilirubin: <0.2 mg/dL — ABNORMAL LOW (ref 0.3–1.2)
Total Protein: 7.8 g/dL (ref 6.5–8.1)

## 2019-03-25 MED ORDER — SODIUM CHLORIDE 0.9 % IV SOLN
10.9000 mg/kg | Freq: Once | INTRAVENOUS | Status: AC
  Administered 2019-03-25: 1120 mg via INTRAVENOUS
  Filled 2019-03-25: qty 20

## 2019-03-25 MED ORDER — SODIUM CHLORIDE 0.9 % IV SOLN
Freq: Once | INTRAVENOUS | Status: AC
  Administered 2019-03-25: 16:00:00 via INTRAVENOUS
  Filled 2019-03-25: qty 250

## 2019-03-25 NOTE — Patient Instructions (Signed)
[facility] Discharge Instructions for Patients Receiving Chemotherapy  Today you received the following chemotherapy agents: Durvalumab (Imfinzi)   To help prevent nausea and vomiting after your treatment, we encourage you to take your nausea medication as directed.   If you develop nausea and vomiting that is not controlled by your nausea medication, call the clinic.   BELOW ARE SYMPTOMS THAT SHOULD BE REPORTED IMMEDIATELY:  *FEVER GREATER THAN 100.5 F  *CHILLS WITH OR WITHOUT FEVER  NAUSEA AND VOMITING THAT IS NOT CONTROLLED WITH YOUR NAUSEA MEDICATION  *UNUSUAL SHORTNESS OF BREATH  *UNUSUAL BRUISING OR BLEEDING  TENDERNESS IN MOUTH AND THROAT WITH OR WITHOUT PRESENCE OF ULCERS  *URINARY PROBLEMS  *BOWEL PROBLEMS  UNUSUAL RASH Items with * indicate a potential emergency and should be followed up as soon as possible.  Feel free to call the clinic should you have any questions or concerns. The clinic phone number is [phone].  Please show the CHEMO ALERT CARD at check-in to the Emergency Department and triage nurse.   

## 2019-03-25 NOTE — Progress Notes (Signed)
[place] [phone]) [phone]   [phone]  OFFICE PROGRESS NOTE  [name], MD [place] [address]  DIAGNOSIS: stage IIIB (T3, N2, M0) non-small cell lung cancer, squamous cell carcinoma presented with large left lower lobe lung mass in addition to mediastinal lymphadenopathy diagnosed in October 2019. PDL 1 expression is negative  PRIOR THERAPY: Concurrent chemoradiation with weekly carboplatin for AUC of 2 and paclitaxel 45 mg/M2.  Status post 7 cycles with partial response.Marland Kitchen  CURRENT THERAPY: Consolidation treatment with immunotherapy with Imfinzi 10 mg/KG every 2 weeks.  First dose July 31, 2018.  Status post 17 cycles.  INTERVAL HISTORY: Deborah [name] 63 y.o. female returns to the clinic today for follow-up visit.  The patient is feeling fine today with no concerning complaints except for the baseline shortness of breath and she is currently on home oxygen.  She denied having any chest pain, cough or hemoptysis.  She denied having any recent weight loss or night sweats.  She has no nausea, vomiting, diarrhea or constipation.  She has no headache or visual changes.  She is here today for evaluation before starting cycle #18.  MEDICAL HISTORY: Past Medical History:  Diagnosis Date  . Anemia    as teen  . Asthma   . CHF (congestive heart failure) ([place])   . COPD, mild ([place])   . Depression   . Diverticulitis   . Family history of anesthesia complication    vomiting  . GI bleeding   . Heart failure ([place])    New onset 07/25/14  . Histoplasmosis    left eye  . Hyperkalemia   . Hypertension   . Obesity (BMI 30-39.9)   . Pneumonia    dx wtih pneumonia on 05/27/16- seen by [facility]   . PONV (postoperative nausea and vomiting)   . Restrictive lung disease   . Shortness of breath    with exertion   . Sleep apnea    mask and oxygen at nite for sleep at 2L   . Tobacco abuse   . Umbilical hernia      ALLERGIES:  has No Known Allergies.  MEDICATIONS:  Current Outpatient Medications  Medication Sig Dispense Refill  . acetaminophen (TYLENOL) 500 MG tablet Take 1,500 mg by mouth 2 (two) times daily as needed for moderate pain or headache.    . albuterol (PROVENTIL) (2.5 MG/3ML) 0.083% nebulizer solution USE 1 VIAL VIA NEBULIZER EVERY 3 HOURS AS NEEDED FOR WHEEZING OR SHORTNESS OF BREATH (Patient taking differently: Take 2.5 mg by nebulization every 3 (three) hours as needed for wheezing or shortness of breath. ) 1125 mL 1  . aspirin 81 MG chewable tablet Chew 1 tablet (81 mg total) by mouth daily. 30 tablet 1  . Cholecalciferol (D3 ADULT PO) Take 1,000 mg by mouth.    . diltiazem (CARDIZEM CD) 240 MG 24 hr capsule Take 1 capsule (240 mg total) by mouth daily. 30 capsule 6  . furosemide (LASIX) 20 MG tablet TAKE 2 TABLETS EACH MORNING AND 1 TAB AT 3 PM 270 tablet 0  . ipratropium-albuterol (DUONEB) 0.5-2.5 (3) MG/3ML SOLN USE 1 VIAL VIA NEBULIZER EVERY 6 HOURS AS NEEDED (Patient taking differently: Inhale 3 mLs into the lungs every 6 (six) hours as needed (shortness of breath or wheezing). ) 360 mL 2  . montelukast (SINGULAIR) 10 MG tablet TAKE 1 TABLET(10 MG) BY MOUTH AT BEDTIME 90 tablet 1  . OXYGEN Inhale  4 L/min into the lungs continuous.     . potassium chloride SA (KLOR-CON M20) 20 MEQ tablet TAKE 1/2 TABLET BY MOUTH DAILY 45 tablet 1  . prochlorperazine (COMPAZINE) 10 MG tablet TAKE 1 TABLET BY MOUTH EVERY 6 HOURS AS NEEDED FOR NAUSEA OR VOMITING. 30 tablet 0  . Spacer/Aero-Holding Chambers (AEROCHAMBER MV) inhaler Use as instructed 1 each 0  . VENTOLIN HFA 108 (90 Base) MCG/ACT inhaler INHALE 2 PUFFS BY MOUTH EVERY 6 HOURS AS NEEDED FOR WHEEZING OR SHORTNESS OF BREATH 18 g 5  . vitamin B-12 (CYANOCOBALAMIN) 500 MCG tablet Take 500 mcg by mouth daily.     No current facility-administered medications for this visit.     SURGICAL HISTORY:  Past Surgical History:  Procedure Laterality  Date  . APPLICATION OF WOUND VAC  03/19/2013   Procedure: APPLICATION OF WOUND VAC;  Surgeon: Madilyn Hook, DO;  Location: [facility];  Service: General;;  . CESAREAN SECTION  04/12/85  . COLONOSCOPY WITH PROPOFOL N/A 06/13/2016   Procedure: COLONOSCOPY WITH PROPOFOL;  Surgeon: Jerene Bears, MD;  Location: [facility];  Service: Gastroenterology;  Laterality: N/A;  . ERCP N/A 02/15/2017   Procedure: ENDOSCOPIC RETROGRADE CHOLANGIOPANCREATOGRAPHY (ERCP);  Surgeon: [name], MD;  Location: [facility];  Service: Endoscopy;  Laterality: N/A;  . INSERTION OF MESH N/A 03/19/2013   Procedure: INSERTION OF MESH;  Surgeon: Madilyn Hook, DO;  Location: [facility];  Service: General;  Laterality: N/A;  . VENTRAL HERNIA REPAIR N/A 03/19/2013   Procedure:  OPEN VENTRAL HERNIA REPAIR WITH MESH AND APPLICATION OF WOUND VAC;  Surgeon: Madilyn Hook, DO;  Location: [facility];  Service: General;  Laterality: N/A;  . VIDEO BRONCHOSCOPY Bilateral 04/19/2018   Procedure: VIDEO BRONCHOSCOPY WITH FLUORO;  Surgeon: Rigoberto Noel, MD;  Location: [facility];  Service: Cardiopulmonary;  Laterality: Bilateral;    REVIEW OF SYSTEMS:  A comprehensive review of systems was negative except for: Respiratory: positive for dyspnea on exertion   PHYSICAL EXAMINATION: General appearance: alert, cooperative and no distress Head: Normocephalic, without obvious abnormality, atraumatic Neck: no adenopathy, no JVD, supple, symmetrical, trachea midline and thyroid not enlarged, symmetric, no tenderness/mass/nodules Lymph nodes: Cervical, supraclavicular, and axillary nodes normal. Resp: clear to auscultation bilaterally Back: symmetric, no curvature. ROM normal. No CVA tenderness. Cardio: regular rate and rhythm, S1, S2 normal, no murmur, click, rub or gallop GI: soft, non-tender; bowel sounds normal; no masses,  no organomegaly Extremities: extremities normal, atraumatic, no cyanosis or edema  ECOG PERFORMANCE STATUS: 1 - Symptomatic but  completely ambulatory  Blood pressure 120/78, pulse (!) 120, temperature 98.9 F (37.2 C), temperature source Oral, resp. rate 18, height 5\' 2"  (1.575 m), weight 241 lb 11.2 oz (109.6 kg), SpO2 99 %.  LABORATORY DATA: Lab Results  Component Value Date   WBC 9.9 03/25/2019   HGB 12.6 03/25/2019   HCT 40.6 03/25/2019   MCV 91.2 03/25/2019   PLT 311 03/25/2019      Chemistry      Component Value Date/Time   NA 140 03/11/2019 1031   NA 143 11/23/2016 1504   K 4.0 03/11/2019 1031   CL 102 03/11/2019 1031   CO2 26 03/11/2019 1031   BUN 19 03/11/2019 1031   BUN 14 11/23/2016 1504   CREATININE 1.04 (H) 03/11/2019 1031      Component Value Date/Time   CALCIUM 9.6 03/11/2019 1031   ALKPHOS 96 03/11/2019 1031   AST 18 03/11/2019 1031   ALT 21 03/11/2019 1031  BILITOT 0.3 03/11/2019 1031       RADIOGRAPHIC STUDIES: No results found.  ASSESSMENT AND PLAN: This is a very pleasant 63 years old white female with stage IIIB non-small cell lung cancer, squamous cell carcinoma. She underwent a course of concurrent chemoradiation with weekly carboplatin and paclitaxel, status post 7 cycles.  She tolerated this treatment well with no concerning adverse effect except for fatigue. She is currently undergoing consolidation treatment with immunotherapy with Imfinzi status post 17 cycles. The patient has been tolerating her treatment well with no concerning adverse effects. I recommended for her to proceed with cycle #18 today as planned. I will see her back for follow-up visit in 2 weeks for evaluation with repeat CT scan of the chest for restaging of her disease. The patient was advised to call immediately if she has any concerning symptoms in the interval. The patient voices understanding of current disease status and treatment options and is in agreement with the current care plan.  All questions were answered. The patient knows to call the clinic with any problems, questions or concerns.  We can certainly see the patient much sooner if necessary.  Disclaimer: This note was dictated with voice recognition software. Similar sounding words can inadvertently be transcribed and may not be corrected upon review.

## 2019-03-26 LAB — TSH: TSH: 1.403 u[IU]/mL (ref 0.308–3.960)

## 2019-04-04 ENCOUNTER — Ambulatory Visit (HOSPITAL_COMMUNITY)
Admission: RE | Admit: 2019-04-04 | Discharge: 2019-04-04 | Disposition: A | Discharge status: Home / Self Care | Payer: BLUE CROSS/BLUE SHIELD | Source: Ambulatory Visit | Attending: Internal Medicine | Admitting: Internal Medicine

## 2019-04-04 ENCOUNTER — Other Ambulatory Visit: Payer: Self-pay

## 2019-04-04 DIAGNOSIS — C3490 Malignant neoplasm of unspecified part of unspecified bronchus or lung: Secondary | ICD-10-CM

## 2019-04-04 MED ORDER — SODIUM CHLORIDE (PF) 0.9 % IJ SOLN
INTRAMUSCULAR | Status: AC
  Filled 2019-04-04: qty 50

## 2019-04-04 MED ORDER — IOHEXOL 300 MG/ML  SOLN
75.0000 mL | Freq: Once | INTRAMUSCULAR | Status: AC | PRN
  Administered 2019-04-04: 75 mL via INTRAVENOUS

## 2019-04-08 ENCOUNTER — Inpatient Hospital Stay: Payer: BLUE CROSS/BLUE SHIELD

## 2019-04-08 ENCOUNTER — Telehealth: Payer: Self-pay | Admitting: Internal Medicine

## 2019-04-08 ENCOUNTER — Encounter: Payer: Self-pay | Admitting: Internal Medicine

## 2019-04-08 ENCOUNTER — Other Ambulatory Visit: Payer: Self-pay

## 2019-04-08 ENCOUNTER — Inpatient Hospital Stay (HOSPITAL_BASED_OUTPATIENT_CLINIC_OR_DEPARTMENT_OTHER): Payer: BLUE CROSS/BLUE SHIELD | Admitting: Internal Medicine

## 2019-04-08 VITALS — BP 143/74 | HR 114 | Temp 99.1°F | Resp 18 | Ht 62.0 in | Wt 244.9 lb

## 2019-04-08 VITALS — HR 90

## 2019-04-08 DIAGNOSIS — I2510 Atherosclerotic heart disease of native coronary artery without angina pectoris: Secondary | ICD-10-CM | POA: Diagnosis not present

## 2019-04-08 DIAGNOSIS — Z5112 Encounter for antineoplastic immunotherapy: Secondary | ICD-10-CM

## 2019-04-08 DIAGNOSIS — C3432 Malignant neoplasm of lower lobe, left bronchus or lung: Secondary | ICD-10-CM | POA: Diagnosis not present

## 2019-04-08 DIAGNOSIS — I10 Essential (primary) hypertension: Secondary | ICD-10-CM

## 2019-04-08 DIAGNOSIS — Z23 Encounter for immunization: Secondary | ICD-10-CM | POA: Diagnosis not present

## 2019-04-08 DIAGNOSIS — I110 Hypertensive heart disease with heart failure: Secondary | ICD-10-CM | POA: Diagnosis not present

## 2019-04-08 DIAGNOSIS — I509 Heart failure, unspecified: Secondary | ICD-10-CM | POA: Diagnosis not present

## 2019-04-08 DIAGNOSIS — Z79899 Other long term (current) drug therapy: Secondary | ICD-10-CM | POA: Diagnosis not present

## 2019-04-08 DIAGNOSIS — G4730 Sleep apnea, unspecified: Secondary | ICD-10-CM | POA: Diagnosis not present

## 2019-04-08 LAB — CMP (CANCER CENTER ONLY)
ALT: 14 U/L (ref 0–44)
AST: 13 U/L — ABNORMAL LOW (ref 15–41)
Albumin: 3.4 g/dL — ABNORMAL LOW (ref 3.5–5.0)
Alkaline Phosphatase: 86 U/L (ref 38–126)
Anion gap: 11 (ref 5–15)
BUN: 18 mg/dL (ref 6–20)
CO2: 29 mmol/L (ref 22–32)
Calcium: 9.5 mg/dL (ref 8.9–10.3)
Chloride: 103 mmol/L (ref 98–111)
Creatinine: 0.94 mg/dL (ref 0.44–1.00)
GFR, Est AFR Am: >60 mL/min (ref >60)
GFR, Estimated: >60 mL/min (ref >60)
Glucose, Bld: 113 mg/dL — ABNORMAL HIGH (ref 70–99)
Potassium: 4.1 mmol/L (ref 3.5–5.1)
Sodium: 143 mmol/L (ref 135–145)
Total Bilirubin: <0.2 mg/dL — ABNORMAL LOW (ref 0.3–1.2)
Total Protein: 7.5 g/dL (ref 6.5–8.1)

## 2019-04-08 LAB — CBC WITH DIFFERENTIAL (CANCER CENTER ONLY)
Abs Immature Granulocytes: 0.02 10*3/uL (ref 0.00–0.07)
Basophils Absolute: 0.1 10*3/uL (ref 0.0–0.1)
Basophils Relative: 1 %
Eosinophils Absolute: 0.1 10*3/uL (ref 0.0–0.5)
Eosinophils Relative: 1 %
HCT: 40.8 % (ref 36.0–46.0)
Hemoglobin: 12.3 g/dL (ref 12.0–15.0)
Immature Granulocytes: 0 %
Lymphocytes Relative: 9 %
Lymphs Abs: 0.8 10*3/uL (ref 0.7–4.0)
MCH: 28.1 pg (ref 26.0–34.0)
MCHC: 30.1 g/dL (ref 30.0–36.0)
MCV: 93.2 fL (ref 80.0–100.0)
Monocytes Absolute: 0.6 10*3/uL (ref 0.1–1.0)
Monocytes Relative: 7 %
Neutro Abs: 7.5 10*3/uL (ref 1.7–7.7)
Neutrophils Relative %: 82 %
Platelet Count: 285 10*3/uL (ref 150–400)
RBC: 4.38 MIL/uL (ref 3.87–5.11)
RDW: 14.2 % (ref 11.5–15.5)
WBC Count: 9.1 10*3/uL (ref 4.0–10.5)
nRBC: 0 % (ref 0.0–0.2)

## 2019-04-08 MED ORDER — INFLUENZA VAC SPLIT QUAD 0.5 ML IM SUSY
PREFILLED_SYRINGE | INTRAMUSCULAR | Status: AC
  Filled 2019-04-08: qty 0.5

## 2019-04-08 MED ORDER — INFLUENZA VAC SPLIT QUAD 0.5 ML IM SUSY
0.5000 mL | PREFILLED_SYRINGE | Freq: Once | INTRAMUSCULAR | Status: AC
  Administered 2019-04-08: 15:00:00 0.5 mL via INTRAMUSCULAR

## 2019-04-08 MED ORDER — SODIUM CHLORIDE 0.9 % IV SOLN
10.9000 mg/kg | Freq: Once | INTRAVENOUS | Status: AC
  Administered 2019-04-08: 1120 mg via INTRAVENOUS
  Filled 2019-04-08: qty 20

## 2019-04-08 MED ORDER — SODIUM CHLORIDE 0.9 % IV SOLN
Freq: Once | INTRAVENOUS | Status: AC
  Administered 2019-04-08: 14:00:00 via INTRAVENOUS
  Filled 2019-04-08: qty 250

## 2019-04-08 NOTE — Progress Notes (Signed)
[place] [phone]   [phone]) [phone]  OFFICE PROGRESS NOTE  [name], MD [place] [address]  DIAGNOSIS: stage IIIB (T3, N2, M0) non-small cell lung cancer, squamous cell carcinoma presented with large left lower lobe lung mass in addition to mediastinal lymphadenopathy diagnosed in October 2019. PDL 1 expression is negative  PRIOR THERAPY: Concurrent chemoradiation with weekly carboplatin for AUC of 2 and paclitaxel 45 mg/M2.  Status post 7 cycles with partial response.Marland Kitchen  CURRENT THERAPY: Consolidation treatment with immunotherapy with Imfinzi 10 mg/KG every 2 weeks.  First dose July 31, 2018.  Status post 18 cycles.  INTERVAL HISTORY: Deborah [name] 63 y.o. female returns to the clinic today for follow-up visit.  The patient is feeling fine today with no concerning complaints except for the shortness of breath with exertion.  She denied having any chest pain, cough or hemoptysis.  She denied having any fever or chills.  She has no nausea, vomiting, diarrhea or constipation.  She denied having any skin rash or itching.  She has been tolerating her treatment with Imfinzi fairly well.  She had repeat CT scan of the chest performed recently and she is here for evaluation and discussion of her scan results.  MEDICAL HISTORY: Past Medical History:  Diagnosis Date  . Anemia    as teen  . Asthma   . CHF (congestive heart failure) ([place])   . COPD, mild ([place])   . Depression   . Diverticulitis   . Family history of anesthesia complication    vomiting  . GI bleeding   . Heart failure ([place])    New onset 07/25/14  . Histoplasmosis    left eye  . Hyperkalemia   . Hypertension   . Obesity (BMI 30-39.9)   . Pneumonia    dx wtih pneumonia on 05/27/16- seen by [facility]   . PONV (postoperative nausea and vomiting)   . Restrictive lung disease   . Shortness of breath    with exertion   . Sleep apnea    mask and oxygen  at nite for sleep at 2L   . Tobacco abuse   . Umbilical hernia     ALLERGIES:  has No Known Allergies.  MEDICATIONS:  Current Outpatient Medications  Medication Sig Dispense Refill  . acetaminophen (TYLENOL) 500 MG tablet Take 1,500 mg by mouth 2 (two) times daily as needed for moderate pain or headache.    . albuterol (PROVENTIL) (2.5 MG/3ML) 0.083% nebulizer solution USE 1 VIAL VIA NEBULIZER EVERY 3 HOURS AS NEEDED FOR WHEEZING OR SHORTNESS OF BREATH (Patient taking differently: Take 2.5 mg by nebulization every 3 (three) hours as needed for wheezing or shortness of breath. ) 1125 mL 1  . aspirin 81 MG chewable tablet Chew 1 tablet (81 mg total) by mouth daily. 30 tablet 1  . Cholecalciferol (D3 ADULT PO) Take 1,000 mg by mouth.    . diltiazem (CARDIZEM CD) 240 MG 24 hr capsule Take 1 capsule (240 mg total) by mouth daily. 30 capsule 6  . furosemide (LASIX) 20 MG tablet TAKE 2 TABLETS EACH MORNING AND 1 TAB AT 3 PM 270 tablet 0  . ipratropium-albuterol (DUONEB) 0.5-2.5 (3) MG/3ML SOLN USE 1 VIAL VIA NEBULIZER EVERY 6 HOURS AS NEEDED (Patient taking differently: Inhale 3 mLs into the lungs every 6 (six) hours as needed (shortness of breath or wheezing). ) 360 mL 2  . montelukast (SINGULAIR) 10 MG tablet  TAKE 1 TABLET(10 MG) BY MOUTH AT BEDTIME 90 tablet 1  . OXYGEN Inhale 4 L/min into the lungs continuous.     . potassium chloride SA (KLOR-CON M20) 20 MEQ tablet TAKE 1/2 TABLET BY MOUTH DAILY 45 tablet 1  . prochlorperazine (COMPAZINE) 10 MG tablet TAKE 1 TABLET BY MOUTH EVERY 6 HOURS AS NEEDED FOR NAUSEA OR VOMITING. 30 tablet 0  . Spacer/Aero-Holding Chambers (AEROCHAMBER MV) inhaler Use as instructed 1 each 0  . VENTOLIN HFA 108 (90 Base) MCG/ACT inhaler INHALE 2 PUFFS BY MOUTH EVERY 6 HOURS AS NEEDED FOR WHEEZING OR SHORTNESS OF BREATH 18 g 5  . vitamin B-12 (CYANOCOBALAMIN) 500 MCG tablet Take 500 mcg by mouth daily.     No current facility-administered medications for this visit.      SURGICAL HISTORY:  Past Surgical History:  Procedure Laterality Date  . APPLICATION OF WOUND VAC  03/19/2013   Procedure: APPLICATION OF WOUND VAC;  Surgeon: Madilyn Hook, DO;  Location: [facility];  Service: General;;  . CESAREAN SECTION  04/12/85  . COLONOSCOPY WITH PROPOFOL N/A 06/13/2016   Procedure: COLONOSCOPY WITH PROPOFOL;  Surgeon: Jerene Bears, MD;  Location[facility];  Service: Gastroenterology;  Laterality: N/A;  . ERCP N/A 02/15/2017   Procedure: ENDOSCOPIC RETROGRADE CHOLANGIOPANCREATOGRAPHY (ERCP);  Surgeon: [name], MD;  Location: [facility];  Service: Endoscopy;  Laterality: N/A;  . INSERTION OF MESH N/A 03/19/2013   Procedure: INSERTION OF MESH;  Surgeon: Madilyn Hook, DO;  Location: [facility];  Service: General;  Laterality: N/A;  . VENTRAL HERNIA REPAIR N/A 03/19/2013   Procedure:  OPEN VENTRAL HERNIA REPAIR WITH MESH AND APPLICATION OF WOUND VAC;  Surgeon: Madilyn Hook, DO;  Location: [facility];  Service: General;  Laterality: N/A;  . VIDEO BRONCHOSCOPY Bilateral 04/19/2018   Procedure: VIDEO BRONCHOSCOPY WITH FLUORO;  Surgeon: Rigoberto Noel, MD;  [facility]: [facility];  Service: Cardiopulmonary;  Laterality: Bilateral;    REVIEW OF SYSTEMS:  Constitutional: negative Eyes: negative Ears, nose, mouth, throat, and face: negative Respiratory: positive for dyspnea on exertion Cardiovascular: negative Gastrointestinal: negative Genitourinary:negative Integument/breast: negative Hematologic/lymphatic: negative Musculoskeletal:negative Neurological: negative Behavioral/Psych: negative Endocrine: negative Allergic/Immunologic: negative   PHYSICAL EXAMINATION: General appearance: alert, cooperative and no distress Head: Normocephalic, without obvious abnormality, atraumatic Neck: no adenopathy, no JVD, supple, symmetrical, trachea midline and thyroid not enlarged, symmetric, no tenderness/mass/nodules Lymph nodes: Cervical, supraclavicular, and axillary nodes normal.  Resp: clear to auscultation bilaterally Back: symmetric, no curvature. ROM normal. No CVA tenderness. Cardio: regular rate and rhythm, S1, S2 normal, no murmur, click, rub or gallop GI: soft, non-tender; bowel sounds normal; no masses,  no organomegaly Extremities: extremities normal, atraumatic, no cyanosis or edema Neurologic: Alert and oriented X 3, normal strength and tone. Normal symmetric reflexes. Normal coordination and gait  ECOG PERFORMANCE STATUS: 1 - Symptomatic but completely ambulatory  Blood pressure (!) 143/74, pulse (!) 114, temperature 99.1 F (37.3 C), temperature source Temporal, resp. rate 18, height 5\' 2"  (1.575 m), weight 244 lb 14.4 oz (111.1 kg), SpO2 99 %.  LABORATORY DATA: Lab Results  Component Value Date   WBC 9.1 04/08/2019   HGB 12.3 04/08/2019   HCT 40.8 04/08/2019   MCV 93.2 04/08/2019   PLT 285 04/08/2019      Chemistry      Component Value Date/Time   NA 143 04/08/2019 1239   NA 143 11/23/2016 1504   K 4.1 04/08/2019 1239   CL 103 04/08/2019 1239   CO2 29  04/08/2019 1239   BUN 18 04/08/2019 1239   BUN 14 11/23/2016 1504   CREATININE 0.94 04/08/2019 1239      Component Value Date/Time   CALCIUM 9.5 04/08/2019 1239   ALKPHOS 86 04/08/2019 1239   AST 13 (L) 04/08/2019 1239   ALT 14 04/08/2019 1239   BILITOT <0.2 (L) 04/08/2019 1239       RADIOGRAPHIC STUDIES: Ct Chest W Contrast  Result Date: 04/04/2019 CLINICAL DATA:  Non-small cell lung cancer restaging EXAM: CT CHEST WITH CONTRAST TECHNIQUE: Multidetector CT imaging of the chest was performed during intravenous contrast administration. CONTRAST:  52mL OMNIPAQUE IOHEXOL 300 MG/ML  SOLN COMPARISON:  Multiple exams, including 01/13/2019 FINDINGS: Cardiovascular: Coronary, aortic arch, and branch vessel atherosclerotic vascular disease. Mediastinum/Nodes: 3.3 by 1.4 cm hypodensity below the left mainstem bronchus and adjacent to the left auricle on image 65/2, stable. This may simply be  pericardial fluid. No definite adenopathy identified. Lungs/Pleura: Severe centrilobular emphysema. Calcified granulomas along the posterior costophrenic angle along with some adjacent scarring. Cylindrical bronchiectasis most notable in the right lower lobe. Left suprahilar volume loss medially with bandlike density in this vicinity worsened from prior, measuring up to 1.6 by 1.3 cm on image 37/5. This is likely mostly from atelectasis. There is some scarring anteriorly in the left upper lobe. There has been some increase in irregular nodularity in the left upper lobe, bandlike on image 43/5, difficult to measure accurately due to orientation. Some of the left lower lobe peripheral airspace opacities have improved. There still a band of airspace opacity in the left lower lobe shown on image 70/5 peripherally. Some of the nodularity lateral to the bulla in the superior segment left lower lobe is improved. No recurrence of the large infrahilar mass originally seen on 04/08/2018. Upper Abdomen: Old granulomatous disease of the spleen. Musculoskeletal: Thoracic spondylosis. IMPRESSION: 1. No significant recurrence identified. Mostly similar findings of waxing and waning densities in the left lung. There is some increase in medial atelectasis in the left upper lobe, but some of the left lower lobe peripheral airspace opacities have improved. Surveillance is likely warranted. 2. Aortic Atherosclerosis (ICD10-I70.0).  Coronary atherosclerosis. 3.  Emphysema (ICD10-J43.9). 4. Old granulomatous disease. 5. Cylindrical bronchiectasis most notable in the right lower lobe. Electronically Signed   By: Van Clines M.D.   On: 04/04/2019 17:53    ASSESSMENT AND PLAN: This is a very pleasant 63 years old white female with stage IIIB non-small cell lung cancer, squamous cell carcinoma. She underwent a course of concurrent chemoradiation with weekly carboplatin and paclitaxel, status post 7 cycles.  She tolerated this  treatment well with no concerning adverse effect except for fatigue. She is currently undergoing consolidation treatment with immunotherapy with Imfinzi status post 18 cycles. The patient has been tolerating her treatment well with no concerning adverse effects. She had repeat CT scan of the chest performed recently.  I personally and independently reviewed the scans and discussed the results with the patient today. Her scan showed no concerning findings for disease progression. I recommended for the patient to continue her current treatment with Imfinzi and she will proceed with cycle #19 today. We will continue to monitor her thyroid function closely. She will come back for follow-up visit in 2 weeks for evaluation before starting cycle #20. The patient was advised to call immediately if she has any concerning symptoms in the interval. The patient voices understanding of current disease status and treatment options and is in agreement with the current care  plan.  All questions were answered. The patient knows to call the clinic with any problems, questions or concerns. We can certainly see the patient much sooner if necessary.  Disclaimer: This note was dictated with voice recognition software. Similar sounding words can inadvertently be transcribed and may not be corrected upon review.

## 2019-04-08 NOTE — Patient Instructions (Signed)
[facility] Discharge Instructions for Patients Receiving Chemotherapy  Today you received the following chemotherapy agents: Durvalumab (Imfinzi)   To help prevent nausea and vomiting after your treatment, we encourage you to take your nausea medication as directed.   If you develop nausea and vomiting that is not controlled by your nausea medication, call the clinic.   BELOW ARE SYMPTOMS THAT SHOULD BE REPORTED IMMEDIATELY:  *FEVER GREATER THAN 100.5 F  *CHILLS WITH OR WITHOUT FEVER  NAUSEA AND VOMITING THAT IS NOT CONTROLLED WITH YOUR NAUSEA MEDICATION  *UNUSUAL SHORTNESS OF BREATH  *UNUSUAL BRUISING OR BLEEDING  TENDERNESS IN MOUTH AND THROAT WITH OR WITHOUT PRESENCE OF ULCERS  *URINARY PROBLEMS  *BOWEL PROBLEMS  UNUSUAL RASH Items with * indicate a potential emergency and should be followed up as soon as possible.  Feel free to call the clinic should you have any questions or concerns. The clinic phone number is [phone].  Please show the CHEMO ALERT CARD at check-in to the Emergency Department and triage nurse.   

## 2019-04-08 NOTE — Telephone Encounter (Signed)
Scheduled appt per 9/22 los - pt to get an update schedule next visit

## 2019-04-08 NOTE — Addendum Note (Signed)
Addended by: Lucile Crater on: 04/08/2019 01:51 PM   Modules accepted: Orders

## 2019-04-20 ENCOUNTER — Other Ambulatory Visit: Payer: Self-pay | Admitting: Family Medicine

## 2019-04-21 ENCOUNTER — Other Ambulatory Visit: Payer: Self-pay | Admitting: Family Medicine

## 2019-04-22 ENCOUNTER — Inpatient Hospital Stay: Payer: BLUE CROSS/BLUE SHIELD

## 2019-04-22 ENCOUNTER — Encounter: Payer: Self-pay | Admitting: Internal Medicine

## 2019-04-22 ENCOUNTER — Inpatient Hospital Stay (HOSPITAL_BASED_OUTPATIENT_CLINIC_OR_DEPARTMENT_OTHER): Payer: BLUE CROSS/BLUE SHIELD | Admitting: Internal Medicine

## 2019-04-22 ENCOUNTER — Other Ambulatory Visit: Payer: Self-pay

## 2019-04-22 ENCOUNTER — Inpatient Hospital Stay: Payer: BLUE CROSS/BLUE SHIELD | Attending: Internal Medicine

## 2019-04-22 VITALS — BP 110/82 | HR 113 | Temp 98.7°F | Resp 18 | Ht 62.0 in | Wt 242.7 lb

## 2019-04-22 VITALS — HR 100

## 2019-04-22 DIAGNOSIS — Z79899 Other long term (current) drug therapy: Secondary | ICD-10-CM | POA: Diagnosis not present

## 2019-04-22 DIAGNOSIS — Z5112 Encounter for antineoplastic immunotherapy: Secondary | ICD-10-CM

## 2019-04-22 DIAGNOSIS — I10 Essential (primary) hypertension: Secondary | ICD-10-CM

## 2019-04-22 DIAGNOSIS — C3432 Malignant neoplasm of lower lobe, left bronchus or lung: Secondary | ICD-10-CM

## 2019-04-22 LAB — CMP (CANCER CENTER ONLY)
ALT: 15 U/L (ref 0–44)
AST: 16 U/L (ref 15–41)
Albumin: 3.5 g/dL (ref 3.5–5.0)
Alkaline Phosphatase: 93 U/L (ref 38–126)
Anion gap: 10 (ref 5–15)
BUN: 15 mg/dL (ref 6–20)
CO2: 30 mmol/L (ref 22–32)
Calcium: 9.5 mg/dL (ref 8.9–10.3)
Chloride: 100 mmol/L (ref 98–111)
Creatinine: 0.89 mg/dL (ref 0.44–1.00)
GFR, Est AFR Am: >60 mL/min (ref >60)
GFR, Estimated: >60 mL/min (ref >60)
Glucose, Bld: 122 mg/dL — ABNORMAL HIGH (ref 70–99)
Potassium: 4.1 mmol/L (ref 3.5–5.1)
Sodium: 140 mmol/L (ref 135–145)
Total Bilirubin: 0.3 mg/dL (ref 0.3–1.2)
Total Protein: 7.9 g/dL (ref 6.5–8.1)

## 2019-04-22 LAB — CBC WITH DIFFERENTIAL (CANCER CENTER ONLY)
Abs Immature Granulocytes: 0.02 10*3/uL (ref 0.00–0.07)
Basophils Absolute: 0 10*3/uL (ref 0.0–0.1)
Basophils Relative: 0 %
Eosinophils Absolute: 0.1 10*3/uL (ref 0.0–0.5)
Eosinophils Relative: 1 %
HCT: 40.5 % (ref 36.0–46.0)
Hemoglobin: 12.5 g/dL (ref 12.0–15.0)
Immature Granulocytes: 0 %
Lymphocytes Relative: 10 %
Lymphs Abs: 0.8 10*3/uL (ref 0.7–4.0)
MCH: 28.1 pg (ref 26.0–34.0)
MCHC: 30.9 g/dL (ref 30.0–36.0)
MCV: 91 fL (ref 80.0–100.0)
Monocytes Absolute: 0.5 10*3/uL (ref 0.1–1.0)
Monocytes Relative: 6 %
Neutro Abs: 6.3 10*3/uL (ref 1.7–7.7)
Neutrophils Relative %: 83 %
Platelet Count: 268 10*3/uL (ref 150–400)
RBC: 4.45 MIL/uL (ref 3.87–5.11)
RDW: 14.2 % (ref 11.5–15.5)
WBC Count: 7.8 10*3/uL (ref 4.0–10.5)
nRBC: 0 % (ref 0.0–0.2)

## 2019-04-22 LAB — TSH: TSH: 2.15 u[IU]/mL (ref 0.308–3.960)

## 2019-04-22 MED ORDER — SODIUM CHLORIDE 0.9 % IV SOLN
Freq: Once | INTRAVENOUS | Status: AC
  Administered 2019-04-22: 14:00:00 via INTRAVENOUS
  Filled 2019-04-22: qty 250

## 2019-04-22 MED ORDER — SODIUM CHLORIDE 0.9 % IV SOLN
10.9000 mg/kg | Freq: Once | INTRAVENOUS | Status: AC
  Administered 2019-04-22: 14:00:00 1120 mg via INTRAVENOUS
  Filled 2019-04-22: qty 20

## 2019-04-22 NOTE — Progress Notes (Signed)
[facility] [phone]   [phone]  OFFICE PROGRESS NOTE  Deborah Passy, MD [address] [place] [address]  DIAGNOSIS: stage IIIB (T3, N2, M0) non-small cell lung cancer, squamous cell carcinoma presented with large left lower lobe lung mass in addition to mediastinal lymphadenopathy diagnosed in October 2019. PDL 1 expression is negative  PRIOR THERAPY: Concurrent chemoradiation with weekly carboplatin for AUC of 2 and paclitaxel 45 mg/M2.  Status post 7 cycles with partial response.Marland Kitchen  CURRENT THERAPY: Consolidation treatment with immunotherapy with Imfinzi 10 mg/KG every 2 weeks.  First dose July 31, 2018.  Status post 19 cycles.  INTERVAL HISTORY: Deborah [name] 63 y.o. female returns to the clinic today for follow-up visit.  The patient is feeling fine today with no concerning complaints except for her baseline shortness of breath and she is on home oxygen.  She denied having any current chest pain but has mild cough with no hemoptysis.  She has no nausea, vomiting, diarrhea or constipation.  She has no headache or visual changes.  The patient continues to tolerate her consolidation treatment with Imfinzi fairly well.  She is here today for evaluation before starting cycle #20.  MEDICAL HISTORY: Past Medical History:  Diagnosis Date  . Anemia    as teen  . Asthma   . CHF (congestive heart failure) ([facility])   . COPD, mild ([facility])   . Depression   . Diverticulitis   . Family history of anesthesia complication    vomiting  . GI bleeding   . Heart failure ([facility])    New onset 07/25/14  . Histoplasmosis    left eye  . Hyperkalemia   . Hypertension   . Obesity (BMI 30-39.9)   . Pneumonia    dx wtih pneumonia on 05/27/16- seen by [facility]   . PONV (postoperative nausea and vomiting)   . Restrictive lung disease   . Shortness of breath    with exertion   . Sleep apnea    mask and oxygen at nite for sleep at 2L   . Tobacco  abuse   . Umbilical hernia     ALLERGIES:  has No Known Allergies.  MEDICATIONS:  Current Outpatient Medications  Medication Sig Dispense Refill  . acetaminophen (TYLENOL) 500 MG tablet Take 1,500 mg by mouth 2 (two) times daily as needed for moderate pain or headache.    . albuterol (PROVENTIL) (2.5 MG/3ML) 0.083% nebulizer solution USE 1 VIAL VIA NEBULIZER EVERY 3 HOURS AS NEEDED FOR WHEEZING OR SHORTNESS OF BREATH (Patient taking differently: Take 2.5 mg by nebulization every 3 (three) hours as needed for wheezing or shortness of breath. ) 1125 mL 1  . aspirin 81 MG chewable tablet Chew 1 tablet (81 mg total) by mouth daily. 30 tablet 1  . Cholecalciferol (D3 ADULT PO) Take 1,000 mg by mouth.    . diltiazem (CARDIZEM CD) 240 MG 24 hr capsule Take 1 capsule (240 mg total) by mouth daily. 30 capsule 6  . furosemide (LASIX) 20 MG tablet TAKE 2 TABLETS EACH MORNING AND 1 TAB AT 3 PM 270 tablet 0  . ipratropium-albuterol (DUONEB) 0.5-2.5 (3) MG/3ML SOLN USE 1 VIAL VIA NEBULIZER EVERY 6 HOURS AS NEEDED (Patient taking differently: Inhale 3 mLs into the lungs every 6 (six) hours as needed (shortness of breath or wheezing). ) 360 mL 2  . montelukast (SINGULAIR) 10 MG tablet TAKE 1 TABLET(10 MG) BY MOUTH AT BEDTIME 90  tablet 1  . OXYGEN Inhale 4 L/min into the lungs continuous.     . potassium chloride SA (KLOR-CON M20) 20 MEQ tablet TAKE 1/2 TABLET BY MOUTH DAILY 45 tablet 1  . prochlorperazine (COMPAZINE) 10 MG tablet TAKE 1 TABLET BY MOUTH EVERY 6 HOURS AS NEEDED FOR NAUSEA OR VOMITING. 30 tablet 0  . Spacer/Aero-Holding Chambers (AEROCHAMBER MV) inhaler Use as instructed 1 each 0  . VENTOLIN HFA 108 (90 Base) MCG/ACT inhaler INHALE 2 PUFFS BY MOUTH EVERY 6 HOURS AS NEEDED FOR WHEEZING OR SHORTNESS OF BREATH 18 g 5  . vitamin B-12 (CYANOCOBALAMIN) 500 MCG tablet Take 500 mcg by mouth daily.     No current facility-administered medications for this visit.     SURGICAL HISTORY:  Past Surgical  History:  Procedure Laterality Date  . APPLICATION OF WOUND VAC  03/19/2013   Procedure: APPLICATION OF WOUND VAC;  Surgeon: Madilyn Hook, DO;  Location: [facility];  Service: General;;  . CESAREAN SECTION  04/12/85  . COLONOSCOPY WITH PROPOFOL N/A 06/13/2016   Procedure: COLONOSCOPY WITH PROPOFOL;  Surgeon: Jerene Bears, MD;  Location: [facility];  Service: Gastroenterology;  Laterality: N/A;  . ERCP N/A 02/15/2017   Procedure: ENDOSCOPIC RETROGRADE CHOLANGIOPANCREATOGRAPHY (ERCP);  Surgeon: [name], MD;  Location: [facility];  Service: Endoscopy;  Laterality: N/A;  . INSERTION OF MESH N/A 03/19/2013   Procedure: INSERTION OF MESH;  Surgeon: Madilyn Hook, DO;  Location: [facility];  Service: General;  Laterality: N/A;  . VENTRAL HERNIA REPAIR N/A 03/19/2013   Procedure:  OPEN VENTRAL HERNIA REPAIR WITH MESH AND APPLICATION OF WOUND VAC;  Surgeon: Madilyn Hook, DO;  Location: [facility];  Service: General;  Laterality: N/A;  . VIDEO BRONCHOSCOPY Bilateral 04/19/2018   Procedure: VIDEO BRONCHOSCOPY WITH FLUORO;  Surgeon: Rigoberto Noel, MD;  [facility];  Service: Cardiopulmonary;  Laterality: Bilateral;    REVIEW OF SYSTEMS:  A comprehensive review of systems was negative except for: Respiratory: positive for cough and dyspnea on exertion   PHYSICAL EXAMINATION: General appearance: alert, cooperative and no distress Head: Normocephalic, without obvious abnormality, atraumatic Neck: no adenopathy, no JVD, supple, symmetrical, trachea midline and thyroid not enlarged, symmetric, no tenderness/mass/nodules Lymph nodes: Cervical, supraclavicular, and axillary nodes normal. Resp: clear to auscultation bilaterally Back: symmetric, no curvature. ROM normal. No CVA tenderness. Cardio: regular rate and rhythm, S1, S2 normal, no murmur, click, rub or gallop GI: soft, non-tender; bowel sounds normal; no masses,  no organomegaly Extremities: extremities normal, atraumatic, no cyanosis or edema   ECOG PERFORMANCE STATUS: 1 - Symptomatic but completely ambulatory  Blood pressure 110/82, pulse (!) 113, temperature 98.7 F (37.1 C), temperature source Temporal, resp. rate 18, height 5\' 2"  (1.575 m), weight 242 lb 11.2 oz (110.1 kg), SpO2 98 %.  LABORATORY DATA: Lab Results  Component Value Date   WBC 7.8 04/22/2019   HGB 12.5 04/22/2019   HCT 40.5 04/22/2019   MCV 91.0 04/22/2019   PLT 268 04/22/2019      Chemistry      Component Value Date/Time   NA 143 04/08/2019 1239   NA 143 11/23/2016 1504   K 4.1 04/08/2019 1239   CL 103 04/08/2019 1239   CO2 29 04/08/2019 1239   BUN 18 04/08/2019 1239   BUN 14 11/23/2016 1504   CREATININE 0.94 04/08/2019 1239      Component Value Date/Time   CALCIUM 9.5 04/08/2019 1239   ALKPHOS 86 04/08/2019 1239   AST 13 (L)  04/08/2019 1239   ALT 14 04/08/2019 1239   BILITOT <0.2 (L) 04/08/2019 1239       RADIOGRAPHIC STUDIES: Ct Chest W Contrast  Result Date: 04/04/2019 CLINICAL DATA:  Non-small cell lung cancer restaging EXAM: CT CHEST WITH CONTRAST TECHNIQUE: Multidetector CT imaging of the chest was performed during intravenous contrast administration. CONTRAST:  6mL OMNIPAQUE IOHEXOL 300 MG/ML  SOLN COMPARISON:  Multiple exams, including 01/13/2019 FINDINGS: Cardiovascular: Coronary, aortic arch, and branch vessel atherosclerotic vascular disease. Mediastinum/Nodes: 3.3 by 1.4 cm hypodensity below the left mainstem bronchus and adjacent to the left auricle on image 65/2, stable. This may simply be pericardial fluid. No definite adenopathy identified. Lungs/Pleura: Severe centrilobular emphysema. Calcified granulomas along the posterior costophrenic angle along with some adjacent scarring. Cylindrical bronchiectasis most notable in the right lower lobe. Left suprahilar volume loss medially with bandlike density in this vicinity worsened from prior, measuring up to 1.6 by 1.3 cm on image 37/5. This is likely mostly from atelectasis. There is  some scarring anteriorly in the left upper lobe. There has been some increase in irregular nodularity in the left upper lobe, bandlike on image 43/5, difficult to measure accurately due to orientation. Some of the left lower lobe peripheral airspace opacities have improved. There still a band of airspace opacity in the left lower lobe shown on image 70/5 peripherally. Some of the nodularity lateral to the bulla in the superior segment left lower lobe is improved. No recurrence of the large infrahilar mass originally seen on 04/08/2018. Upper Abdomen: Old granulomatous disease of the spleen. Musculoskeletal: Thoracic spondylosis. IMPRESSION: 1. No significant recurrence identified. Mostly similar findings of waxing and waning densities in the left lung. There is some increase in medial atelectasis in the left upper lobe, but some of the left lower lobe peripheral airspace opacities have improved. Surveillance is likely warranted. 2. Aortic Atherosclerosis (ICD10-I70.0).  Coronary atherosclerosis. 3.  Emphysema (ICD10-J43.9). 4. Old granulomatous disease. 5. Cylindrical bronchiectasis most notable in the right lower lobe. Electronically Signed   By: Van Clines M.D.   On: 04/04/2019 17:53    ASSESSMENT AND PLAN: This is a very pleasant 63 years old white female with stage IIIB non-small cell lung cancer, squamous cell carcinoma. She underwent a course of concurrent chemoradiation with weekly carboplatin and paclitaxel, status post 7 cycles.  She tolerated this treatment well with no concerning adverse effect except for fatigue. She is currently undergoing consolidation treatment with immunotherapy with Imfinzi status post 19 cycles. She has been tolerating this treatment well with no concerning adverse effects. I recommended for her to proceed with cycle #20 today as planned. I will see her back for follow-up visit in 2 weeks for evaluation before the next cycle of her treatment. The patient was  advised to call immediately if she has any concerning symptoms in the interval. The patient voices understanding of current disease status and treatment options and is in agreement with the current care plan.  All questions were answered. The patient knows to call the clinic with any problems, questions or concerns. We can certainly see the patient much sooner if necessary.  Disclaimer: This note was dictated with voice recognition software. Similar sounding words can inadvertently be transcribed and may not be corrected upon review.

## 2019-04-22 NOTE — Patient Instructions (Signed)
[facility] Discharge Instructions for Patients Receiving Chemotherapy  Today you received the following chemotherapy agents: Imfinzi.  To help prevent nausea and vomiting after your treatment, we encourage you to take your nausea medication as directed.   If you develop nausea and vomiting that is not controlled by your nausea medication, call the clinic.   BELOW ARE SYMPTOMS THAT SHOULD BE REPORTED IMMEDIATELY:  *FEVER GREATER THAN 100.5 F  *CHILLS WITH OR WITHOUT FEVER  NAUSEA AND VOMITING THAT IS NOT CONTROLLED WITH YOUR NAUSEA MEDICATION  *UNUSUAL SHORTNESS OF BREATH  *UNUSUAL BRUISING OR BLEEDING  TENDERNESS IN MOUTH AND THROAT WITH OR WITHOUT PRESENCE OF ULCERS  *URINARY PROBLEMS  *BOWEL PROBLEMS  UNUSUAL RASH Items with * indicate a potential emergency and should be followed up as soon as possible.  Feel free to call the clinic should you have any questions or concerns. The clinic phone number is [phone].  Please show the CHEMO ALERT CARD at check-in to the Emergency Department and triage nurse.   

## 2019-04-23 NOTE — Telephone Encounter (Signed)
Please advise on refill.

## 2019-05-06 ENCOUNTER — Inpatient Hospital Stay: Payer: BLUE CROSS/BLUE SHIELD

## 2019-05-06 ENCOUNTER — Inpatient Hospital Stay (HOSPITAL_BASED_OUTPATIENT_CLINIC_OR_DEPARTMENT_OTHER): Payer: BLUE CROSS/BLUE SHIELD | Admitting: Physician Assistant

## 2019-05-06 ENCOUNTER — Other Ambulatory Visit: Payer: Self-pay

## 2019-05-06 VITALS — BP 125/82 | HR 98 | Temp 99.1°F | Resp 18 | Ht 62.0 in | Wt 242.9 lb

## 2019-05-06 DIAGNOSIS — Z5112 Encounter for antineoplastic immunotherapy: Secondary | ICD-10-CM

## 2019-05-06 DIAGNOSIS — Z79899 Other long term (current) drug therapy: Secondary | ICD-10-CM | POA: Diagnosis not present

## 2019-05-06 DIAGNOSIS — C3432 Malignant neoplasm of lower lobe, left bronchus or lung: Secondary | ICD-10-CM | POA: Diagnosis not present

## 2019-05-06 LAB — CBC WITH DIFFERENTIAL (CANCER CENTER ONLY)
Abs Immature Granulocytes: 0.05 10*3/uL (ref 0.00–0.07)
Basophils Absolute: 0 10*3/uL (ref 0.0–0.1)
Basophils Relative: 0 %
Eosinophils Absolute: 0.1 10*3/uL (ref 0.0–0.5)
Eosinophils Relative: 1 %
HCT: 41.8 % (ref 36.0–46.0)
Hemoglobin: 13 g/dL (ref 12.0–15.0)
Immature Granulocytes: 1 %
Lymphocytes Relative: 10 %
Lymphs Abs: 1 10*3/uL (ref 0.7–4.0)
MCH: 28.4 pg (ref 26.0–34.0)
MCHC: 31.1 g/dL (ref 30.0–36.0)
MCV: 91.3 fL (ref 80.0–100.0)
Monocytes Absolute: 0.8 10*3/uL (ref 0.1–1.0)
Monocytes Relative: 7 %
Neutro Abs: 8.7 10*3/uL — ABNORMAL HIGH (ref 1.7–7.7)
Neutrophils Relative %: 81 %
Platelet Count: 305 10*3/uL (ref 150–400)
RBC: 4.58 MIL/uL (ref 3.87–5.11)
RDW: 14.5 % (ref 11.5–15.5)
WBC Count: 10.7 10*3/uL — ABNORMAL HIGH (ref 4.0–10.5)
nRBC: 0 % (ref 0.0–0.2)

## 2019-05-06 LAB — CMP (CANCER CENTER ONLY)
ALT: 19 U/L (ref 0–44)
AST: 14 U/L — ABNORMAL LOW (ref 15–41)
Albumin: 3.6 g/dL (ref 3.5–5.0)
Alkaline Phosphatase: 97 U/L (ref 38–126)
Anion gap: 15 (ref 5–15)
BUN: 19 mg/dL (ref 6–20)
CO2: 25 mmol/L (ref 22–32)
Calcium: 9.3 mg/dL (ref 8.9–10.3)
Chloride: 100 mmol/L (ref 98–111)
Creatinine: 0.97 mg/dL (ref 0.44–1.00)
GFR, Est AFR Am: >60 mL/min (ref >60)
GFR, Estimated: >60 mL/min (ref >60)
Glucose, Bld: 112 mg/dL — ABNORMAL HIGH (ref 70–99)
Potassium: 3.7 mmol/L (ref 3.5–5.1)
Sodium: 140 mmol/L (ref 135–145)
Total Bilirubin: 0.2 mg/dL — ABNORMAL LOW (ref 0.3–1.2)
Total Protein: 8 g/dL (ref 6.5–8.1)

## 2019-05-06 MED ORDER — SODIUM CHLORIDE 0.9 % IV SOLN
10.8500 mg/kg | Freq: Once | INTRAVENOUS | Status: AC
  Administered 2019-05-06: 1120 mg via INTRAVENOUS
  Filled 2019-05-06: qty 20

## 2019-05-06 MED ORDER — SODIUM CHLORIDE 0.9 % IV SOLN
Freq: Once | INTRAVENOUS | Status: AC
  Administered 2019-05-06: 12:00:00 via INTRAVENOUS
  Filled 2019-05-06: qty 250

## 2019-05-06 NOTE — Patient Instructions (Signed)
[facility] Discharge Instructions for Patients Receiving Chemotherapy  Today you received the following chemotherapy agents : Durvalumab.  To help prevent nausea and vomiting after your treatment, we encourage you to take your nausea medication as prescribed.   If you develop nausea and vomiting that is not controlled by your nausea medication, call the clinic.   BELOW ARE SYMPTOMS THAT SHOULD BE REPORTED IMMEDIATELY:  *FEVER GREATER THAN 100.5 F  *CHILLS WITH OR WITHOUT FEVER  NAUSEA AND VOMITING THAT IS NOT CONTROLLED WITH YOUR NAUSEA MEDICATION  *UNUSUAL SHORTNESS OF BREATH  *UNUSUAL BRUISING OR BLEEDING  TENDERNESS IN MOUTH AND THROAT WITH OR WITHOUT PRESENCE OF ULCERS  *URINARY PROBLEMS  *BOWEL PROBLEMS  UNUSUAL RASH Items with * indicate a potential emergency and should be followed up as soon as possible.  Feel free to call the clinic should you have any questions or concerns. The clinic phone number is [phone].  Please show the [facility] at check-in to the Emergency Department and triage nurse.

## 2019-05-06 NOTE — Progress Notes (Signed)
[facility] OFFICE PROGRESS NOTE  [name], MD [place] [address]  DIAGNOSIS: Stage IIIB (T3, N2, M0) non-small cell lung cancer, squamous cell carcinoma presented with large left lower lobe lung mass in addition to mediastinal lymphadenopathy diagnosed in October 2019. PDL 1 expression is negative  PRIOR THERAPY: Concurrent chemoradiation with weekly carboplatin for AUC of 2 and paclitaxel 45 mg/M2. Status post 7cycles with partial response.  CURRENT THERAPY: Consolidation treatment with immunotherapy with Imfinzi 10 mg/KG every 2 weeks. First dose July 31, 2018.Status post20cycles.  INTERVAL HISTORY: Deborah [name] 63 y.o. female returns to the clinic for a follow up visit. The patient is feeling well today without any concerning complaints except for her baseline shortness of breath and cough. She is on 3L of oxygen via nasal cannula. The patient continues to tolerate treatment with imfinzi well without any adverse side effects except for dry skin. Denies any fever, chills, night sweats, or weight loss. Denies any chest pain or hemoptysis. Denies any nausea, vomiting, diarrhea, or constipation. Denies any headache or visual changes. Denies any rashes but does report dry flaky skin. She uses lotion for this concern. The patient is here today for evaluation prior to starting cycle #21    MEDICAL HISTORY: Past Medical History:  Diagnosis Date  . Anemia    as teen  . Asthma   . CHF (congestive heart failure) ([facility])   . COPD, mild ([facility])   . Depression   . Diverticulitis   . Family history of anesthesia complication    vomiting  . GI bleeding   . Heart failure ([facility])    New onset 07/25/14  . Histoplasmosis    left eye  . Hyperkalemia   . Hypertension   . Obesity (BMI 30-39.9)   . Pneumonia    dx wtih pneumonia on 05/27/16- seen by [facility]   . PONV (postoperative nausea and vomiting)   . Restrictive lung disease   . Shortness  of breath    with exertion   . Sleep apnea    mask and oxygen at nite for sleep at 2L   . Tobacco abuse   . Umbilical hernia     ALLERGIES:  has No Known Allergies.  MEDICATIONS:  Current Outpatient Medications  Medication Sig Dispense Refill  . OXYGEN Inhale 4 L/min into the lungs continuous.     . potassium chloride SA (KLOR-CON M20) 20 MEQ tablet TAKE 1/2 TABLET BY MOUTH EVERY DAY 45 tablet 1  . acetaminophen (TYLENOL) 500 MG tablet Take 1,500 mg by mouth 2 (two) times daily as needed for moderate pain or headache.    . albuterol (PROVENTIL) (2.5 MG/3ML) 0.083% nebulizer solution USE 1 VIAL VIA NEBULIZER EVERY 3 HOURS AS NEEDED FOR WHEEZING OR SHORTNESS OF BREATH (Patient taking differently: Take 2.5 mg by nebulization every 3 (three) hours as needed for wheezing or shortness of breath. ) 1125 mL 1  . aspirin 81 MG chewable tablet Chew 1 tablet (81 mg total) by mouth daily. 30 tablet 1  . Cholecalciferol (D3 ADULT PO) Take 1,000 mg by mouth.    . diltiazem (CARDIZEM CD) 240 MG 24 hr capsule Take 1 capsule (240 mg total) by mouth daily. 30 capsule 6  . furosemide (LASIX) 20 MG tablet TAKE 2 TABLETS EACH MORNING AND 1 TAB AT 3 PM 270 tablet 0  . ipratropium-albuterol (DUONEB) 0.5-2.5 (3) MG/3ML SOLN USE 1 VIAL VIA NEBULIZER EVERY 6 HOURS AS NEEDED (Patient  taking differently: Inhale 3 mLs into the lungs every 6 (six) hours as needed (shortness of breath or wheezing). ) 360 mL 2  . montelukast (SINGULAIR) 10 MG tablet TAKE 1 TABLET(10 MG) BY MOUTH AT BEDTIME 90 tablet 1  . prochlorperazine (COMPAZINE) 10 MG tablet TAKE 1 TABLET BY MOUTH EVERY 6 HOURS AS NEEDED FOR NAUSEA OR VOMITING. (Patient not taking: Reported on 05/06/2019) 30 tablet 0  . Spacer/Aero-Holding Chambers (AEROCHAMBER MV) inhaler Use as instructed 1 each 0  . VENTOLIN HFA 108 (90 Base) MCG/ACT inhaler INHALE 2 PUFFS BY MOUTH EVERY 6 HOURS AS NEEDED FOR WHEEZING OR SHORTNESS OF BREATH 18 g 5  . vitamin B-12 (CYANOCOBALAMIN)  500 MCG tablet Take 500 mcg by mouth daily.     No current facility-administered medications for this visit.    Facility-Administered Medications Ordered in Other Visits  Medication Dose Route Frequency Provider Last Rate Last Dose  . durvalumab (IMFINZI) 1,120 mg in sodium chloride 0.9 % 100 mL chemo infusion  10.85 mg/kg (Treatment Plan Recorded) Intravenous Once Curt Bears, MD        SURGICAL HISTORY:  Past Surgical History:  Procedure Laterality Date  . APPLICATION OF WOUND VAC  03/19/2013   Procedure: APPLICATION OF WOUND VAC;  Surgeon: Madilyn Hook, DO;  Location: [facility];  Service: General;;  . CESAREAN SECTION  04/12/85  . COLONOSCOPY WITH PROPOFOL N/A 06/13/2016   Procedure: COLONOSCOPY WITH PROPOFOL;  Surgeon: Jerene Bears, MD;  [facility]: [facility];  Service: Gastroenterology;  Laterality: N/A;  . ERCP N/A 02/15/2017   Procedure: ENDOSCOPIC RETROGRADE CHOLANGIOPANCREATOGRAPHY (ERCP);  Surgeon: [name], MD;  Location: [facility];  Service: Endoscopy;  Laterality: N/A;  . INSERTION OF MESH N/A 03/19/2013   Procedure: INSERTION OF MESH;  Surgeon: Madilyn Hook, DO;  Location: [facility];  Service: General;  Laterality: N/A;  . VENTRAL HERNIA REPAIR N/A 03/19/2013   Procedure:  OPEN VENTRAL HERNIA REPAIR WITH MESH AND APPLICATION OF WOUND VAC;  Surgeon: Madilyn Hook, DO;  [facility]: [facility];  Service: General;  Laterality: N/A;  . VIDEO BRONCHOSCOPY Bilateral 04/19/2018   Procedure: VIDEO BRONCHOSCOPY WITH FLUORO;  Surgeon: Rigoberto Noel, MD;  [facility];  Service: Cardiopulmonary;  Laterality: Bilateral;    REVIEW OF SYSTEMS:   Review of Systems  Constitutional: Negative for appetite change, chills, fatigue, fever and unexpected weight change.  HENT: Negative for mouth sores, nosebleeds, sore throat and trouble swallowing.   Eyes: Negative for eye problems and icterus.  Respiratory:  Positive for baseline shortness of breath and cough. Negative for hemoptysis and  wheezing.   Cardiovascular: Negative for chest pain and leg swelling.  Gastrointestinal: Negative for abdominal pain, constipation, diarrhea, nausea and vomiting.  Genitourinary: Negative for bladder incontinence, difficulty urinating, dysuria, frequency and hematuria.   Musculoskeletal: Negative for back pain, gait problem, neck pain and neck stiffness.  Skin: Positive for dry skin. Negative for itching and rash.  Neurological: Negative for dizziness, extremity weakness, gait problem, headaches, light-headedness and seizures.  Hematological: Negative for adenopathy. Does not bruise/bleed easily.  Psychiatric/Behavioral: Negative for confusion, depression and sleep disturbance. The patient is not nervous/anxious.    PHYSICAL EXAMINATION:  Blood pressure 125/82, pulse 98, temperature 99.1 F (37.3 C), temperature source Temporal, resp. rate 18, height 5\' 2"  (1.575 m), weight 242 lb 14.4 oz (110.2 kg), SpO2 100 %.  ECOG PERFORMANCE STATUS: 1 - Symptomatic but completely ambulatory  Physical Exam  Constitutional: Oriented to person, place, and time and well-developed,  well-nourished, and in no distress. On oxygen via nasal cannula HENT:  Head: Normocephalic and atraumatic.  Mouth/Throat: Oropharynx is clear and moist. No oropharyngeal exudate.  Eyes: Conjunctivae are normal. Right eye exhibits no discharge. Left eye exhibits no discharge. No scleral icterus.  Neck: Normal range of motion. Neck supple.  Cardiovascular: Normal rate, regular rhythm, normal heart sounds and intact distal pulses.   Pulmonary/Chest: Effort normal. Wheezing in all lung fields. No respiratory distress. No rales.  Abdominal: Soft. Bowel sounds are normal. Exhibits no distension and no mass. There is no tenderness.  Musculoskeletal: Normal range of motion. Exhibits no edema.  Lymphadenopathy:    No cervical adenopathy.  Neurological: Alert and oriented to person, place, and time. Exhibits normal muscle tone.  Coordination normal. Uses a walker for ambulation. Skin: Skin is warm and dry. No rash noted. Not diaphoretic. No erythema. No pallor.  Psychiatric: Mood, memory and judgment normal.  Vitals reviewed.  LABORATORY DATA: Lab Results  Component Value Date   WBC 10.7 (H) 05/06/2019   HGB 13.0 05/06/2019   HCT 41.8 05/06/2019   MCV 91.3 05/06/2019   PLT 305 05/06/2019      Chemistry      Component Value Date/Time   NA 140 05/06/2019 0955   NA 143 11/23/2016 1504   K 3.7 05/06/2019 0955   CL 100 05/06/2019 0955   CO2 25 05/06/2019 0955   BUN 19 05/06/2019 0955   BUN 14 11/23/2016 1504   CREATININE 0.97 05/06/2019 0955      Component Value Date/Time   CALCIUM 9.3 05/06/2019 0955   ALKPHOS 97 05/06/2019 0955   AST 14 (L) 05/06/2019 0955   ALT 19 05/06/2019 0955   BILITOT 0.2 (L) 05/06/2019 0955       RADIOGRAPHIC STUDIES:  No results found.   ASSESSMENT/PLAN:  This is a very pleasant 63 year old Caucasian female with stage IIIb non-small cell lung cancer, squamous cell carcinoma. She presented with a large left lower lobe lung mass in addition to mediastinal lymphadenopathy. She was diagnosed in October 2019. Her PDL 1 expression is negative.  She underwent a course of concurrent chemoradiation with weekly carboplatin for an AUC of 2 and paclitaxel 45 mg/m. She is status post 7 cycles. She tolerated treatment well without any adverse side effects. She is currently undergoing consolidation immunotherapy with Imfinzi 10 mg/kg IV every 2 weeks. She is status post 20 cycles and has been tolerating it well without any adverse side effects except for dry skin for which she uses lotion.  Labs were reviewed with the patient. Recommend that she proceed with cycle #21 today scheduled.  We will see her back for follow-up visit in 2 weeks for evaluation before starting cycle #22.  She will use lotion for her dry skin.   The patient was advised to call immediately if she  has any concerning symptoms in the interval. The patient voices understanding of current disease status and treatment options and is in agreement with the current care plan. All questions were answered. The patient knows to call the clinic with any problems, questions or concerns. We can certainly see the patient much sooner if necessary No orders of the defined types were placed in this encounter.    Shahil Speegle L Tishie Altmann, PA-C 05/06/19

## 2019-05-07 ENCOUNTER — Telehealth: Payer: Self-pay | Admitting: Internal Medicine

## 2019-05-07 NOTE — Telephone Encounter (Signed)
Per 10/20 los appts already scheduled.

## 2019-05-20 ENCOUNTER — Inpatient Hospital Stay (HOSPITAL_BASED_OUTPATIENT_CLINIC_OR_DEPARTMENT_OTHER): Payer: BLUE CROSS/BLUE SHIELD | Admitting: Physician Assistant

## 2019-05-20 ENCOUNTER — Telehealth: Payer: Self-pay | Admitting: Physician Assistant

## 2019-05-20 ENCOUNTER — Inpatient Hospital Stay: Payer: BLUE CROSS/BLUE SHIELD | Attending: Internal Medicine

## 2019-05-20 ENCOUNTER — Inpatient Hospital Stay: Payer: BLUE CROSS/BLUE SHIELD

## 2019-05-20 ENCOUNTER — Other Ambulatory Visit: Payer: Self-pay

## 2019-05-20 VITALS — BP 112/62 | HR 120 | Temp 98.5°F | Resp 19 | Ht 62.0 in | Wt 242.8 lb

## 2019-05-20 VITALS — HR 105

## 2019-05-20 DIAGNOSIS — Z79899 Other long term (current) drug therapy: Secondary | ICD-10-CM | POA: Diagnosis not present

## 2019-05-20 DIAGNOSIS — Z5112 Encounter for antineoplastic immunotherapy: Secondary | ICD-10-CM | POA: Diagnosis not present

## 2019-05-20 DIAGNOSIS — C3432 Malignant neoplasm of lower lobe, left bronchus or lung: Secondary | ICD-10-CM | POA: Diagnosis not present

## 2019-05-20 DIAGNOSIS — E669 Obesity, unspecified: Secondary | ICD-10-CM | POA: Insufficient documentation

## 2019-05-20 DIAGNOSIS — J449 Chronic obstructive pulmonary disease, unspecified: Secondary | ICD-10-CM | POA: Diagnosis not present

## 2019-05-20 LAB — TSH: TSH: 3.344 u[IU]/mL (ref 0.308–3.960)

## 2019-05-20 LAB — CBC WITH DIFFERENTIAL (CANCER CENTER ONLY)
Abs Immature Granulocytes: 0.04 10*3/uL (ref 0.00–0.07)
Basophils Absolute: 0 10*3/uL (ref 0.0–0.1)
Basophils Relative: 0 %
Eosinophils Absolute: 0.2 10*3/uL (ref 0.0–0.5)
Eosinophils Relative: 2 %
HCT: 42.2 % (ref 36.0–46.0)
Hemoglobin: 13 g/dL (ref 12.0–15.0)
Immature Granulocytes: 0 %
Lymphocytes Relative: 8 %
Lymphs Abs: 0.9 10*3/uL (ref 0.7–4.0)
MCH: 28.1 pg (ref 26.0–34.0)
MCHC: 30.8 g/dL (ref 30.0–36.0)
MCV: 91.3 fL (ref 80.0–100.0)
Monocytes Absolute: 0.7 10*3/uL (ref 0.1–1.0)
Monocytes Relative: 7 %
Neutro Abs: 8.6 10*3/uL — ABNORMAL HIGH (ref 1.7–7.7)
Neutrophils Relative %: 83 %
Platelet Count: 302 10*3/uL (ref 150–400)
RBC: 4.62 MIL/uL (ref 3.87–5.11)
RDW: 14.5 % (ref 11.5–15.5)
WBC Count: 10.4 10*3/uL (ref 4.0–10.5)
nRBC: 0 % (ref 0.0–0.2)

## 2019-05-20 LAB — CMP (CANCER CENTER ONLY)
ALT: 16 U/L (ref 0–44)
AST: 17 U/L (ref 15–41)
Albumin: 3.3 g/dL — ABNORMAL LOW (ref 3.5–5.0)
Alkaline Phosphatase: 95 U/L (ref 38–126)
Anion gap: 13 (ref 5–15)
BUN: 18 mg/dL (ref 6–20)
CO2: 27 mmol/L (ref 22–32)
Calcium: 9.3 mg/dL (ref 8.9–10.3)
Chloride: 101 mmol/L (ref 98–111)
Creatinine: 1.02 mg/dL — ABNORMAL HIGH (ref 0.44–1.00)
GFR, Est AFR Am: >60 mL/min (ref >60)
GFR, Estimated: 60 mL/min — ABNORMAL LOW (ref >60)
Glucose, Bld: 146 mg/dL — ABNORMAL HIGH (ref 70–99)
Potassium: 3.7 mmol/L (ref 3.5–5.1)
Sodium: 141 mmol/L (ref 135–145)
Total Bilirubin: 0.2 mg/dL — ABNORMAL LOW (ref 0.3–1.2)
Total Protein: 7.9 g/dL (ref 6.5–8.1)

## 2019-05-20 MED ORDER — SODIUM CHLORIDE 0.9 % IV SOLN
10.9000 mg/kg | Freq: Once | INTRAVENOUS | Status: AC
  Administered 2019-05-20: 1120 mg via INTRAVENOUS
  Filled 2019-05-20: qty 20

## 2019-05-20 MED ORDER — SODIUM CHLORIDE 0.9 % IV SOLN
Freq: Once | INTRAVENOUS | Status: AC
  Administered 2019-05-20: 12:00:00 via INTRAVENOUS
  Filled 2019-05-20: qty 250

## 2019-05-20 NOTE — Patient Instructions (Signed)
[facility] Discharge Instructions for Patients Receiving Chemotherapy  Today you received the following chemotherapy agents: Imfinzi.  To help prevent nausea and vomiting after your treatment, we encourage you to take your nausea medication as directed.   If you develop nausea and vomiting that is not controlled by your nausea medication, call the clinic.   BELOW ARE SYMPTOMS THAT SHOULD BE REPORTED IMMEDIATELY:  *FEVER GREATER THAN 100.5 F  *CHILLS WITH OR WITHOUT FEVER  NAUSEA AND VOMITING THAT IS NOT CONTROLLED WITH YOUR NAUSEA MEDICATION  *UNUSUAL SHORTNESS OF BREATH  *UNUSUAL BRUISING OR BLEEDING  TENDERNESS IN MOUTH AND THROAT WITH OR WITHOUT PRESENCE OF ULCERS  *URINARY PROBLEMS  *BOWEL PROBLEMS  UNUSUAL RASH Items with * indicate a potential emergency and should be followed up as soon as possible.  Feel free to call the clinic should you have any questions or concerns. The clinic phone number is [phone].  Please show the CHEMO ALERT CARD at check-in to the Emergency Department and triage nurse.   

## 2019-05-20 NOTE — Telephone Encounter (Signed)
Scheduled per los. Called and spoke with patient. Confirmed appt 

## 2019-05-20 NOTE — Progress Notes (Signed)
[place] OFFICE PROGRESS NOTE  Deborah Passy, MD [place] [address]  DIAGNOSIS: Stage IIIB (T3, N2, M0) non-small cell lung cancer, squamous cell carcinoma presented with large left lower lobe lung mass in addition to mediastinal lymphadenopathy diagnosed in October 2019. PDL 1 expression is negative  PRIOR THERAPY: Concurrent chemoradiation with weekly carboplatin for AUC of 2 and paclitaxel 45 mg/M2. Status post 7cycles with partial response.  CURRENT THERAPY: Consolidation treatment with immunotherapy with Imfinzi 10 mg/KG every 2 weeks. First dose July 31, 2018.Status post21cycles.  INTERVAL HISTORY: Deborah [name] 63 y.o. female returns to the clinic for a follow up visit. The patient is feeling well today without any concerning complaints except for her baseline shortness of breath and cough. She is on 3L of oxygen via nasal cannula. The patient continues to tolerate treatment with immunotherapy with Imfinzi well without any adverse side effects except for dry flaky skin. Denies any fever, chills, night sweats, or weight loss. Denies any chest pain, cough, or hemoptysis. Denies any nausea, vomiting, diarrhea, or constipation. Denies any visual changes. She had a headache that lasted about 5-6 days after her last treatment. She tried to take tylenol and ibuprofen for her symptoms as well as increase her fluid intake without significant relief. She localizes her headache to being on top of her head bilaterally. She denies any associated symptoms to accompany the headache. She denies any visual changes, dizziness, or hearing changes. The patient is here today for evaluation prior to starting cycle # 22   MEDICAL HISTORY: Past Medical History:  Diagnosis Date  . Anemia    as teen  . Asthma   . CHF (congestive heart failure) ([place])   . COPD, mild ([place])   . Depression   . Diverticulitis   . Family history of anesthesia complication    vomiting  . GI bleeding   . Heart failure ([place])    New onset 07/25/14  . Histoplasmosis    left eye  . Hyperkalemia   . Hypertension   . Obesity (BMI 30-39.9)   . Pneumonia    dx wtih pneumonia on 05/27/16- seen by [facility]   . PONV (postoperative nausea and vomiting)   . Restrictive lung disease   . Shortness of breath    with exertion   . Sleep apnea    mask and oxygen at nite for sleep at 2L   . Tobacco abuse   . Umbilical hernia     ALLERGIES:  has No Known Allergies.  MEDICATIONS:  Current Outpatient Medications  Medication Sig Dispense Refill  . acetaminophen (TYLENOL) 500 MG tablet Take 1,500 mg by mouth 2 (two) times daily as needed for moderate pain or headache.    . albuterol (PROVENTIL) (2.5 MG/3ML) 0.083% nebulizer solution USE 1 VIAL VIA NEBULIZER EVERY 3 HOURS AS NEEDED FOR WHEEZING OR SHORTNESS OF BREATH (Patient taking differently: Take 2.5 mg by nebulization every 3 (three) hours as needed for wheezing or shortness of breath. ) 1125 mL 1  . aspirin 81 MG chewable tablet Chew 1 tablet (81 mg total) by mouth daily. 30 tablet 1  . Cholecalciferol (D3 ADULT PO) Take 1,000 mg by mouth.    . diltiazem (CARDIZEM CD) 240 MG 24 hr capsule Take 1 capsule (240 mg total) by mouth daily. 30 capsule 6  . furosemide (LASIX) 20 MG tablet TAKE 2 TABLETS EACH MORNING AND 1 TAB AT 3 PM 270 tablet 0  . ipratropium-albuterol (  DUONEB) 0.5-2.5 (3) MG/3ML SOLN USE 1 VIAL VIA NEBULIZER EVERY 6 HOURS AS NEEDED (Patient taking differently: Inhale 3 mLs into the lungs every 6 (six) hours as needed (shortness of breath or wheezing). ) 360 mL 2  . montelukast (SINGULAIR) 10 MG tablet TAKE 1 TABLET(10 MG) BY MOUTH AT BEDTIME 90 tablet 1  . OXYGEN Inhale 4 L/min into the lungs continuous.     . potassium chloride SA (KLOR-CON M20) 20 MEQ tablet TAKE 1/2 TABLET BY MOUTH EVERY DAY 45 tablet 1  . prochlorperazine (COMPAZINE) 10 MG tablet TAKE 1 TABLET BY MOUTH EVERY 6 HOURS AS NEEDED FOR NAUSEA OR  VOMITING. 30 tablet 0  . Spacer/Aero-Holding Chambers (AEROCHAMBER MV) inhaler Use as instructed 1 each 0  . VENTOLIN HFA 108 (90 Base) MCG/ACT inhaler INHALE 2 PUFFS BY MOUTH EVERY 6 HOURS AS NEEDED FOR WHEEZING OR SHORTNESS OF BREATH 18 g 5  . vitamin B-12 (CYANOCOBALAMIN) 500 MCG tablet Take 500 mcg by mouth daily.     No current facility-administered medications for this visit.    Facility-Administered Medications Ordered in Other Visits  Medication Dose Route Frequency Provider Last Rate Last Dose  . durvalumab (IMFINZI) 1,120 mg in sodium chloride 0.9 % 100 mL chemo infusion  10.9 mg/kg (Treatment Plan Recorded) Intravenous Once Curt Bears, MD        SURGICAL HISTORY:  Past Surgical History:  Procedure Laterality Date  . APPLICATION OF WOUND VAC  03/19/2013   Procedure: APPLICATION OF WOUND VAC;  Surgeon: Madilyn Hook, DO;  Location: [facility];  Service: General;;  . CESAREAN SECTION  04/12/85  . COLONOSCOPY WITH PROPOFOL N/A 06/13/2016   Procedure: COLONOSCOPY WITH PROPOFOL;  Surgeon: Jerene Bears, MD;  Location: [facility];  Service: Gastroenterology;  Laterality: N/A;  . ERCP N/A 02/15/2017   Procedure: ENDOSCOPIC RETROGRADE CHOLANGIOPANCREATOGRAPHY (ERCP);  Surgeon: [name], MD;  Location: [facility];  Service: Endoscopy;  Laterality: N/A;  . INSERTION OF MESH N/A 03/19/2013   Procedure: INSERTION OF MESH;  Surgeon: Madilyn Hook, DO;  Location: [facility];  Service: General;  Laterality: N/A;  . VENTRAL HERNIA REPAIR N/A 03/19/2013   Procedure:  OPEN VENTRAL HERNIA REPAIR WITH MESH AND APPLICATION OF WOUND VAC;  Surgeon: Madilyn Hook, DO;  Location: [facility];  Service: General;  Laterality: N/A;  . VIDEO BRONCHOSCOPY Bilateral 04/19/2018   Procedure: VIDEO BRONCHOSCOPY WITH FLUORO;  Surgeon: Rigoberto Noel, MD;  [facility]: [facility];  Service: Cardiopulmonary;  Laterality: Bilateral;    REVIEW OF SYSTEMS:   Review of Systems  Constitutional: Negative for appetite change,  chills, fatigue, fever and unexpected weight change.  HENT: Negative for mouth sores, nosebleeds, sore throat and trouble swallowing.  Eyes: Negative for eye problems and icterus.  Respiratory:Positive for baseline shortness of breath and cough.Negative forhemoptysisand wheezing.  Cardiovascular: Negative for chest pain and leg swelling.  Gastrointestinal: Negative for abdominal pain, constipation, diarrhea, nausea and vomiting.  Genitourinary: Negative for bladder incontinence, difficulty urinating, dysuria, frequency and hematuria.  Musculoskeletal: Negative for back pain, gait problem, neck pain and neck stiffness.  Skin:Positive for dry skin.Negative for itching and rash.  Neurological: Positive for headache following treatment which has resolved at this time. Negative for dizziness, extremity weakness, gait problem, light-headedness and seizures.  Hematological: Negative for adenopathy. Does not bruise/bleed easily.  Psychiatric/Behavioral: Negative for confusion, depression and sleep disturbance. The patient is not nervous/anxious.    PHYSICAL EXAMINATION:  Blood pressure 112/62, pulse (!) 120, temperature 98.5 F (36.9 C),  temperature source Temporal, resp. rate 19, height 5\' 2"  (1.575 m), weight 242 lb 12.8 oz (110.1 kg), SpO2 94 %.  ECOG PERFORMANCE STATUS: 1 - Symptomatic but completely ambulatory  Physical Exam  Constitutional: Oriented to person, place, and time and well-developed, well-nourished, and in no distress. On oxygen via nasal cannula HENT:  Head: Normocephalic and atraumatic.  Mouth/Throat: Oropharynx is clear and moist. No oropharyngeal exudate.  Eyes: Conjunctivae are normal. Right eye exhibits no discharge. Left eye exhibits no discharge. No scleral icterus.  Neck: Normal range of motion. Neck supple.  Cardiovascular: Normal rate, regular rhythm, normal heart sounds and intact distal pulses.   Pulmonary/Chest: Effort normal and breath sounds normal but  decreased in all lung fields. No respiratory distress. No wheezes. No rales.  Abdominal: Soft. Bowel sounds are normal. Exhibits no distension and no mass. There is no tenderness.  Musculoskeletal: Normal range of motion. Exhibits no edema.  Lymphadenopathy:    No cervical adenopathy.  Neurological: Alert and oriented to person, place, and time. Exhibits normal muscle tone. Gait normal. Coordination normal.  Skin: Skin is warm and dry. No rash noted. Not diaphoretic. No erythema. No pallor.  Psychiatric: Mood, memory and judgment normal.  Vitals reviewed.  LABORATORY DATA: Lab Results  Component Value Date   WBC 10.4 05/20/2019   HGB 13.0 05/20/2019   HCT 42.2 05/20/2019   MCV 91.3 05/20/2019   PLT 302 05/20/2019      Chemistry      Component Value Date/Time   NA 141 05/20/2019 1041   NA 143 11/23/2016 1504   K 3.7 05/20/2019 1041   CL 101 05/20/2019 1041   CO2 27 05/20/2019 1041   BUN 18 05/20/2019 1041   BUN 14 11/23/2016 1504   CREATININE 1.02 (H) 05/20/2019 1041      Component Value Date/Time   CALCIUM 9.3 05/20/2019 1041   ALKPHOS 95 05/20/2019 1041   AST 17 05/20/2019 1041   ALT 16 05/20/2019 1041   BILITOT 0.2 (L) 05/20/2019 1041       RADIOGRAPHIC STUDIES:  No results found.   ASSESSMENT/PLAN:  This is a very pleasant 63 year old Caucasian female with stage IIIb non-small cell lung cancer, squamous cell carcinoma. She presented with a large left lower lobe lung mass in addition to mediastinal lymphadenopathy. She was diagnosed in October 2019. Her PDL 1 expression is negative.  She underwent a course of concurrent chemoradiation with weekly carboplatin for an AUC of 2 and paclitaxel 45 mg/m. She is status post 7 cycles. She tolerated treatment well without any adverse side effects. She is currently undergoing consolidation immunotherapy with Imfinzi 10 mg/kg IV every 2 weeks. She is status post 21cycles and has been tolerating it well without any  adverse side effects except for dry skin for which she uses lotion.  Labs were reviewed with the patient. Recommend that she proceed with cycle #23today scheduled.  She will use lotion for her dry skin.  The patient was advised to call immediately if she has any concerning symptoms in the interval. The patient voices understanding of current disease status and treatment options and is in agreement with the current care plan. All questions were answered. The patient knows to call the clinic with any problems, questions or concerns. We can certainly see the patient much sooner if necessary   No orders of the defined types were placed in this encounter.    Cassandra L Heilingoetter, PA-C 05/20/19

## 2019-05-20 NOTE — Progress Notes (Signed)
OK to treat with pulse 105, per Cassandra H., PA.

## 2019-06-03 ENCOUNTER — Encounter: Payer: Self-pay | Admitting: Physician Assistant

## 2019-06-03 ENCOUNTER — Inpatient Hospital Stay (HOSPITAL_BASED_OUTPATIENT_CLINIC_OR_DEPARTMENT_OTHER): Payer: BLUE CROSS/BLUE SHIELD | Admitting: Physician Assistant

## 2019-06-03 ENCOUNTER — Inpatient Hospital Stay: Payer: BLUE CROSS/BLUE SHIELD

## 2019-06-03 ENCOUNTER — Other Ambulatory Visit: Payer: Self-pay

## 2019-06-03 VITALS — HR 103

## 2019-06-03 VITALS — BP 139/81 | HR 124 | Temp 100.0°F | Resp 19 | Ht 62.0 in | Wt 246.0 lb

## 2019-06-03 DIAGNOSIS — C3432 Malignant neoplasm of lower lobe, left bronchus or lung: Secondary | ICD-10-CM

## 2019-06-03 DIAGNOSIS — J449 Chronic obstructive pulmonary disease, unspecified: Secondary | ICD-10-CM | POA: Diagnosis not present

## 2019-06-03 DIAGNOSIS — E669 Obesity, unspecified: Secondary | ICD-10-CM | POA: Diagnosis not present

## 2019-06-03 DIAGNOSIS — Z5112 Encounter for antineoplastic immunotherapy: Secondary | ICD-10-CM | POA: Diagnosis not present

## 2019-06-03 DIAGNOSIS — Z79899 Other long term (current) drug therapy: Secondary | ICD-10-CM | POA: Diagnosis not present

## 2019-06-03 LAB — CBC WITH DIFFERENTIAL (CANCER CENTER ONLY)
Abs Immature Granulocytes: 0.03 10*3/uL (ref 0.00–0.07)
Basophils Absolute: 0 10*3/uL (ref 0.0–0.1)
Basophils Relative: 1 %
Eosinophils Absolute: 0.2 10*3/uL (ref 0.0–0.5)
Eosinophils Relative: 2 %
HCT: 40.9 % (ref 36.0–46.0)
Hemoglobin: 12.7 g/dL (ref 12.0–15.0)
Immature Granulocytes: 0 %
Lymphocytes Relative: 6 %
Lymphs Abs: 0.5 10*3/uL — ABNORMAL LOW (ref 0.7–4.0)
MCH: 28.4 pg (ref 26.0–34.0)
MCHC: 31.1 g/dL (ref 30.0–36.0)
MCV: 91.5 fL (ref 80.0–100.0)
Monocytes Absolute: 0.6 10*3/uL (ref 0.1–1.0)
Monocytes Relative: 7 %
Neutro Abs: 7.1 10*3/uL (ref 1.7–7.7)
Neutrophils Relative %: 84 %
Platelet Count: 239 10*3/uL (ref 150–400)
RBC: 4.47 MIL/uL (ref 3.87–5.11)
RDW: 14.3 % (ref 11.5–15.5)
WBC Count: 8.5 10*3/uL (ref 4.0–10.5)
nRBC: 0 % (ref 0.0–0.2)

## 2019-06-03 LAB — CMP (CANCER CENTER ONLY)
ALT: 16 U/L (ref 0–44)
AST: 15 U/L (ref 15–41)
Albumin: 3.3 g/dL — ABNORMAL LOW (ref 3.5–5.0)
Alkaline Phosphatase: 91 U/L (ref 38–126)
Anion gap: 12 (ref 5–15)
BUN: 14 mg/dL (ref 8–23)
CO2: 27 mmol/L (ref 22–32)
Calcium: 8.8 mg/dL — ABNORMAL LOW (ref 8.9–10.3)
Chloride: 102 mmol/L (ref 98–111)
Creatinine: 0.87 mg/dL (ref 0.44–1.00)
GFR, Est AFR Am: >60 mL/min (ref >60)
GFR, Estimated: >60 mL/min (ref >60)
Glucose, Bld: 127 mg/dL — ABNORMAL HIGH (ref 70–99)
Potassium: 4.1 mmol/L (ref 3.5–5.1)
Sodium: 141 mmol/L (ref 135–145)
Total Bilirubin: <0.2 mg/dL — ABNORMAL LOW (ref 0.3–1.2)
Total Protein: 7.2 g/dL (ref 6.5–8.1)

## 2019-06-03 MED ORDER — SODIUM CHLORIDE 0.9 % IV SOLN
Freq: Once | INTRAVENOUS | Status: AC
  Administered 2019-06-03: 11:00:00 via INTRAVENOUS
  Filled 2019-06-03: qty 250

## 2019-06-03 MED ORDER — SODIUM CHLORIDE 0.9 % IV SOLN
10.9000 mg/kg | Freq: Once | INTRAVENOUS | Status: AC
  Administered 2019-06-03: 1120 mg via INTRAVENOUS
  Filled 2019-06-03: qty 2.4

## 2019-06-03 NOTE — Progress Notes (Signed)
Ok to treat with Hr 103 per [employer], PA

## 2019-06-03 NOTE — Progress Notes (Signed)
[place] OFFICE PROGRESS NOTE  [name], MD [place] [address]  DIAGNOSIS: Stage IIIB (T3, N2, M0) non-small cell lung cancer, squamous cell carcinoma presented with large left lower lobe lung mass in addition to mediastinal lymphadenopathy diagnosed in October 2019. PDL 1 expression is negative  PRIOR THERAPY: Concurrent chemoradiation with weekly carboplatin for AUC of 2 and paclitaxel 45 mg/M2. Status post 7cycles with partial response.  CURRENT THERAPY: Consolidation treatment with immunotherapy with Imfinzi 10 mg/KG every 2 weeks. First dose July 31, 2018.Status post22cycles.  INTERVAL HISTORY: Deborah [name] 63 y.o. female returns to the clinic for a follow up visit. The patient is feeling well today without any concerning complaints except that she is getting over a slight cold. The patient continues to tolerate treatment with immunotherapy with Imfinzi well without any adverse side effects except for dry flaky skin, particularly on her face/forehead. Denies any fever, chills, night sweats, or weight loss. Denies any chest pain or hemoptysis. She reports her baseline shortness of breath with exertion and cough secondary to her COPD. She is on home oxygen via nasal canula.  Denies any nausea, vomiting, diarrhea, or constipation. Denies any headache or visual changes. The patient is here today for evaluation prior to starting cycle # 23  MEDICAL HISTORY: Past Medical History:  Diagnosis Date  . Anemia    as teen  . Asthma   . CHF (congestive heart failure) ([place])   . COPD, mild ([place])   . Depression   . Diverticulitis   . Family history of anesthesia complication    vomiting  . GI bleeding   . Heart failure ([place])    New onset 07/25/14  . Histoplasmosis    left eye  . Hyperkalemia   . Hypertension   . Obesity (BMI 30-39.9)   . Pneumonia    dx wtih pneumonia on 05/27/16- seen by [facility]   . PONV (postoperative nausea  and vomiting)   . Restrictive lung disease   . Shortness of breath    with exertion   . Sleep apnea    mask and oxygen at nite for sleep at 2L   . Tobacco abuse   . Umbilical hernia     ALLERGIES:  has No Known Allergies.  MEDICATIONS:  Current Outpatient Medications  Medication Sig Dispense Refill  . acetaminophen (TYLENOL) 500 MG tablet Take 1,500 mg by mouth 2 (two) times daily as needed for moderate pain or headache.    . albuterol (PROVENTIL) (2.5 MG/3ML) 0.083% nebulizer solution USE 1 VIAL VIA NEBULIZER EVERY 3 HOURS AS NEEDED FOR WHEEZING OR SHORTNESS OF BREATH (Patient taking differently: Take 2.5 mg by nebulization every 3 (three) hours as needed for wheezing or shortness of breath. ) 1125 mL 1  . aspirin 81 MG chewable tablet Chew 1 tablet (81 mg total) by mouth daily. 30 tablet 1  . Cholecalciferol (D3 ADULT PO) Take 1,000 mg by mouth.    . diltiazem (CARDIZEM CD) 240 MG 24 hr capsule Take 1 capsule (240 mg total) by mouth daily. 30 capsule 6  . furosemide (LASIX) 20 MG tablet TAKE 2 TABLETS EACH MORNING AND 1 TAB AT 3 PM 270 tablet 0  . ipratropium-albuterol (DUONEB) 0.5-2.5 (3) MG/3ML SOLN USE 1 VIAL VIA NEBULIZER EVERY 6 HOURS AS NEEDED (Patient taking differently: Inhale 3 mLs into the lungs every 6 (six) hours as needed (shortness of breath or wheezing). ) 360 mL 2  . montelukast (SINGULAIR)  10 MG tablet TAKE 1 TABLET(10 MG) BY MOUTH AT BEDTIME 90 tablet 1  . OXYGEN Inhale 4 L/min into the lungs continuous.     . potassium chloride SA (KLOR-CON M20) 20 MEQ tablet TAKE 1/2 TABLET BY MOUTH EVERY DAY 45 tablet 1  . prochlorperazine (COMPAZINE) 10 MG tablet TAKE 1 TABLET BY MOUTH EVERY 6 HOURS AS NEEDED FOR NAUSEA OR VOMITING. 30 tablet 0  . Spacer/Aero-Holding Chambers (AEROCHAMBER MV) inhaler Use as instructed 1 each 0  . VENTOLIN HFA 108 (90 Base) MCG/ACT inhaler INHALE 2 PUFFS BY MOUTH EVERY 6 HOURS AS NEEDED FOR WHEEZING OR SHORTNESS OF BREATH 18 g 5  . vitamin B-12  (CYANOCOBALAMIN) 500 MCG tablet Take 500 mcg by mouth daily.     No current facility-administered medications for this visit.     SURGICAL HISTORY:  Past Surgical History:  Procedure Laterality Date  . APPLICATION OF WOUND VAC  03/19/2013   Procedure: APPLICATION OF WOUND VAC;  Surgeon: Madilyn Hook, DO;  Location: [facility];  Service: General;;  . CESAREAN SECTION  04/12/85  . COLONOSCOPY WITH PROPOFOL N/A 06/13/2016   Procedure: COLONOSCOPY WITH PROPOFOL;  Surgeon: Jerene Bears, MD;  Location: [facility];  Service: Gastroenterology;  Laterality: N/A;  . ERCP N/A 02/15/2017   Procedure: ENDOSCOPIC RETROGRADE CHOLANGIOPANCREATOGRAPHY (ERCP);  Surgeon: [name], MD;  Location: [facility];  Service: Endoscopy;  Laterality: N/A;  . INSERTION OF MESH N/A 03/19/2013   Procedure: INSERTION OF MESH;  Surgeon: Madilyn Hook, DO;  Location: [facility];  Service: General;  Laterality: N/A;  . VENTRAL HERNIA REPAIR N/A 03/19/2013   Procedure:  OPEN VENTRAL HERNIA REPAIR WITH MESH AND APPLICATION OF WOUND VAC;  Surgeon: Madilyn Hook, DO;  Location: [facility];  Service: General;  Laterality: N/A;  . VIDEO BRONCHOSCOPY Bilateral 04/19/2018   Procedure: VIDEO BRONCHOSCOPY WITH FLUORO;  Surgeon: Rigoberto Noel, MD;  [facility];  Service: Cardiopulmonary;  Laterality: Bilateral;    REVIEW OF SYSTEMS:   Review of Systems  Constitutional: Negative for appetite change, chills, fatigue, fever and unexpected weight change.  HENT:   Negative for mouth sores, nosebleeds, sore throat and trouble swallowing.   Eyes: Negative for eye problems and icterus.  Respiratory: Positive for baseline shortness of breath and cough. Negative for hemoptysis and wheezing.   Cardiovascular: Negative for chest pain and leg swelling.  Gastrointestinal: Negative for abdominal pain, constipation, diarrhea, nausea and vomiting.  Genitourinary: Negative for bladder incontinence, difficulty urinating, dysuria, frequency and  hematuria.   Musculoskeletal: Negative for back pain, gait problem, neck pain and neck stiffness.  Skin: Positive for dry skin.Negative for itching Neurological: Negative for dizziness, extremity weakness, gait problem, headaches, light-headedness and seizures.  Hematological: Negative for adenopathy. Does not bruise/bleed easily.  Psychiatric/Behavioral: Negative for confusion, depression and sleep disturbance. The patient is not nervous/anxious.     PHYSICAL EXAMINATION:  Blood pressure 139/81, pulse (!) 124, temperature 100 F (37.8 C), resp. rate 19, height 5\' 2"  (1.575 m), weight 246 lb (111.6 kg), SpO2 100 %.  ECOG PERFORMANCE STATUS: 1 - Symptomatic but completely ambulatory  Physical Exam  Constitutional: Oriented to person, place, and time and well-developed, well-nourished, and in no distress. On oxygen via nasal cannula HENT:  Head: Normocephalic and atraumatic.  Mouth/Throat: Oropharynx is clear and moist. No oropharyngeal exudate.  Eyes: Conjunctivae are normal. Right eye exhibits no discharge. Left eye exhibits no discharge. No scleral icterus.  Neck: Normal range of motion. Neck supple.  Cardiovascular:  Normal rate, regular rhythm, normal heart sounds and intact distal pulses.   Pulmonary/Chest: Effort normal. Decreased breath sounds in all lung fields. No respiratory distress. No wheezes. No rales.  Abdominal: Soft. Bowel sounds are normal. Exhibits no distension and no mass. There is no tenderness.  Musculoskeletal: Normal range of motion. Exhibits no edema.  Lymphadenopathy:    No cervical adenopathy.  Neurological: Alert and oriented to person, place, and time. Exhibits normal muscle tone. Gait normal. Coordination normal.  Skin: Skin is warm and dry. No rash noted. Not diaphoretic. No erythema. No pallor.  Psychiatric: Mood, memory and judgment normal.  Vitals reviewed.  LABORATORY DATA: Lab Results  Component Value Date   WBC 8.5 06/03/2019   HGB 12.7  06/03/2019   HCT 40.9 06/03/2019   MCV 91.5 06/03/2019   PLT 239 06/03/2019      Chemistry      Component Value Date/Time   NA 141 05/20/2019 1041   NA 143 11/23/2016 1504   K 3.7 05/20/2019 1041   CL 101 05/20/2019 1041   CO2 27 05/20/2019 1041   BUN 18 05/20/2019 1041   BUN 14 11/23/2016 1504   CREATININE 1.02 (H) 05/20/2019 1041      Component Value Date/Time   CALCIUM 9.3 05/20/2019 1041   ALKPHOS 95 05/20/2019 1041   AST 17 05/20/2019 1041   ALT 16 05/20/2019 1041   BILITOT 0.2 (L) 05/20/2019 1041       RADIOGRAPHIC STUDIES:  No results found.   ASSESSMENT/PLAN:  This is a very pleasant 63 year old Caucasian female with stage IIIb non-small cell lung cancer, squamous cell carcinoma. She presented with a large left lower lobe lung mass in addition to mediastinal lymphadenopathy. She was diagnosed in October 2019. Her PDL 1 expression is negative.  She underwent a course of concurrent chemoradiation with weekly carboplatin for an AUC of 2 and paclitaxel 45 mg/m. She is status post 7 cycles. She tolerated treatment well without any adverse side effects.  She is currently undergoing consolidation immunotherapy with Imfinzi 10 mg/kg IV every 2 weeks. She is status post22cycles and has been tolerating it well without any adverse side effects except for dry skin for which she uses lotion.  The patient was seen with Dr. Julien Nordmann today. Labs were reviewed with the patient. Recommend that she proceed with cycle #23today scheduled.  We will see her back for a follow up visit in 2 weeks for evaluation before starting cycle #24.   She will use lotion for her dry skin.  The patient was advised to call immediately if she has any concerning symptoms in the interval. The patient voices understanding of current disease status and treatment options and is in agreement with the current care plan. All questions were answered. The patient knows to call the clinic with any  problems, questions or concerns. We can certainly see the patient much sooner if necessary  No orders of the defined types were placed in this encounter.    Jayde Daffin L Jamilyn Pigeon, PA-C 06/03/19  ADDENDUM: Hematology/Oncology Attending: I had a face-to-face encounter with the patient today.  I recommended her care plan.  This is a very pleasant 63 years old white female with a stage IIIb non-small cell lung cancer, squamous cell carcinoma status post a course of concurrent chemoradiation with weekly carboplatin and paclitaxel.  She is currently undergoing consolidation treatment with immunotherapy with Imfinzi every 2 weeks status post 22 cycles.  The patient has been tolerating her treatment well with no  concerning adverse effects. I recommended for her to proceed with cycle #23 today as planned. I will see the patient back for follow-up visit in 2 weeks for evaluation before the next cycle of her treatment. The patient was advised to call immediately if she has any concerning symptoms in the interval.  Disclaimer: This note was dictated with voice recognition software. Similar sounding words can inadvertently be transcribed and may be missed upon review. Eilleen Kempf, MD 06/03/19

## 2019-06-03 NOTE — Patient Instructions (Signed)
[facility] Discharge Instructions for Patients Receiving Chemotherapy  Today you received the following chemotherapy agents: Imfinzi.  To help prevent nausea and vomiting after your treatment, we encourage you to take your nausea medication as directed.   If you develop nausea and vomiting that is not controlled by your nausea medication, call the clinic.   BELOW ARE SYMPTOMS THAT SHOULD BE REPORTED IMMEDIATELY:  *FEVER GREATER THAN 100.5 F  *CHILLS WITH OR WITHOUT FEVER  NAUSEA AND VOMITING THAT IS NOT CONTROLLED WITH YOUR NAUSEA MEDICATION  *UNUSUAL SHORTNESS OF BREATH  *UNUSUAL BRUISING OR BLEEDING  TENDERNESS IN MOUTH AND THROAT WITH OR WITHOUT PRESENCE OF ULCERS  *URINARY PROBLEMS  *BOWEL PROBLEMS  UNUSUAL RASH Items with * indicate a potential emergency and should be followed up as soon as possible.  Feel free to call the clinic should you have any questions or concerns. The clinic phone number is [phone].  Please show the CHEMO ALERT CARD at check-in to the Emergency Department and triage nurse.   

## 2019-06-04 ENCOUNTER — Telehealth: Payer: Self-pay | Admitting: Physician Assistant

## 2019-06-04 NOTE — Telephone Encounter (Signed)
Scheduled per los. Called and left msg. Mailed printout  °

## 2019-06-16 NOTE — Progress Notes (Signed)
[facility] OFFICE PROGRESS NOTE  Deborah Passy, MD [place] [address]  DIAGNOSIS: Stage IIIB (T3, N2, M0) non-small cell lung cancer, squamous cell carcinoma presented with large left lower lobe lung mass in addition to mediastinal lymphadenopathy diagnosed in October 2019. PDL 1 expression is negative  PRIOR THERAPY: Stage IIIB (T3, N2, M0) non-small cell lung cancer, squamous cell carcinoma presented with large left lower lobe lung mass in addition to mediastinal lymphadenopathy diagnosed in October 2019. PDL 1 expression is negative  CURRENT THERAPY: Consolidation treatment with immunotherapy with Imfinzi 10 mg/KG every 2 weeks. First dose July 31, 2018.Status post23cycles.  INTERVAL HISTORY: Deborah [name] 63 y.o. female returns to the clinic for a follow up visit. The patient is feeling well today without any concerning complaints except for her baseline shortness of breath with exertion and baseline cough secondary to her COPD. She is on home oxygen via nasal cannula.  The patient continues to tolerate treatment with immunotherapy with Imfinzi well without any adverse side effects except for dry flaky skin, particularly on her face/forehead. Denies any fever, chills, night sweats, or weight loss. Denies any chest pain or hemoptysis. Denies any nausea, vomiting, diarrhea, or constipation. Denies any headache or visual changes. The patient is here today for evaluation prior to starting cycle # 24  MEDICAL HISTORY: Past Medical History:  Diagnosis Date  . Anemia    as teen  . Asthma   . CHF (congestive heart failure) ([place])   . COPD, mild ([place])   . Depression   . Diverticulitis   . Family history of anesthesia complication    vomiting  . GI bleeding   . Heart failure ([place])    New onset 07/25/14  . Histoplasmosis    left eye  . Hyperkalemia   . Hypertension   . Obesity (BMI 30-39.9)   . Pneumonia    dx wtih pneumonia on  05/27/16- seen by [facility]   . PONV (postoperative nausea and vomiting)   . Restrictive lung disease   . Shortness of breath    with exertion   . Sleep apnea    mask and oxygen at nite for sleep at 2L   . Tobacco abuse   . Umbilical hernia     ALLERGIES:  has No Known Allergies.  MEDICATIONS:  Current Outpatient Medications  Medication Sig Dispense Refill  . acetaminophen (TYLENOL) 500 MG tablet Take 1,500 mg by mouth 2 (two) times daily as needed for moderate pain or headache.    . albuterol (PROVENTIL) (2.5 MG/3ML) 0.083% nebulizer solution USE 1 VIAL VIA NEBULIZER EVERY 3 HOURS AS NEEDED FOR WHEEZING OR SHORTNESS OF BREATH (Patient taking differently: Take 2.5 mg by nebulization every 3 (three) hours as needed for wheezing or shortness of breath. ) 1125 mL 1  . aspirin 81 MG chewable tablet Chew 1 tablet (81 mg total) by mouth daily. 30 tablet 1  . Cholecalciferol (D3 ADULT PO) Take 1,000 mg by mouth.    . diltiazem (CARDIZEM CD) 240 MG 24 hr capsule Take 1 capsule (240 mg total) by mouth daily. 30 capsule 6  . furosemide (LASIX) 20 MG tablet TAKE 2 TABLETS EACH MORNING AND 1 TAB AT 3 PM 270 tablet 0  . ipratropium-albuterol (DUONEB) 0.5-2.5 (3) MG/3ML SOLN USE 1 VIAL VIA NEBULIZER EVERY 6 HOURS AS NEEDED (Patient taking differently: Inhale 3 mLs into the lungs every 6 (six) hours as needed (shortness of breath or wheezing). )  360 mL 2  . montelukast (SINGULAIR) 10 MG tablet TAKE 1 TABLET(10 MG) BY MOUTH AT BEDTIME 90 tablet 1  . OXYGEN Inhale 4 L/min into the lungs continuous.     . potassium chloride SA (KLOR-CON M20) 20 MEQ tablet TAKE 1/2 TABLET BY MOUTH EVERY DAY 45 tablet 1  . prochlorperazine (COMPAZINE) 10 MG tablet TAKE 1 TABLET BY MOUTH EVERY 6 HOURS AS NEEDED FOR NAUSEA OR VOMITING. 30 tablet 0  . Spacer/Aero-Holding Chambers (AEROCHAMBER MV) inhaler Use as instructed 1 each 0  . VENTOLIN HFA 108 (90 Base) MCG/ACT inhaler INHALE 2 PUFFS BY MOUTH EVERY 6 HOURS AS NEEDED FOR  WHEEZING OR SHORTNESS OF BREATH 18 g 5  . vitamin B-12 (CYANOCOBALAMIN) 500 MCG tablet Take 500 mcg by mouth daily.     No current facility-administered medications for this visit.     SURGICAL HISTORY:  Past Surgical History:  Procedure Laterality Date  . APPLICATION OF WOUND VAC  03/19/2013   Procedure: APPLICATION OF WOUND VAC;  Surgeon: Madilyn Hook, DO;  Location: [facility];  Service: General;;  . CESAREAN SECTION  04/12/85  . COLONOSCOPY WITH PROPOFOL N/A 06/13/2016   Procedure: COLONOSCOPY WITH PROPOFOL;  Surgeon: Jerene Bears, MD;  Location: [facility];  Service: Gastroenterology;  Laterality: N/A;  . ERCP N/A 02/15/2017   Procedure: ENDOSCOPIC RETROGRADE CHOLANGIOPANCREATOGRAPHY (ERCP);  Surgeon: [name], MD;  Location: [facility];  Service: Endoscopy;  Laterality: N/A;  . INSERTION OF MESH N/A 03/19/2013   Procedure: INSERTION OF MESH;  Surgeon: Madilyn Hook, DO;  Location: [facility];  Service: General;  Laterality: N/A;  . VENTRAL HERNIA REPAIR N/A 03/19/2013   Procedure:  OPEN VENTRAL HERNIA REPAIR WITH MESH AND APPLICATION OF WOUND VAC;  Surgeon: Madilyn Hook, DO;  [facility]: [facility];  Service: General;  Laterality: N/A;  . VIDEO BRONCHOSCOPY Bilateral 04/19/2018   Procedure: VIDEO BRONCHOSCOPY WITH FLUORO;  Surgeon: Rigoberto Noel, MD;  [facility];  Service: Cardiopulmonary;  Laterality: Bilateral;    REVIEW OF SYSTEMS:   Review of Systems  Constitutional: Positive for fatigue. Negative for appetite change, chills, fever and unexpected weight change.  HENT: Negative for mouth sores, nosebleeds, sore throat and trouble swallowing.   Eyes: Negative for eye problems and icterus.  Respiratory: Positive for baseline shortness of breath and cough. Negative for hemoptysis and wheezing.   Cardiovascular: Negative for chest pain and leg swelling.  Gastrointestinal: Negative for abdominal pain, constipation, diarrhea, nausea and vomiting.  Genitourinary: Negative for  bladder incontinence, difficulty urinating, dysuria, frequency and hematuria.   Musculoskeletal: Negative for back pain, gait problem, neck pain and neck stiffness.  Skin: Positive for dry skin.Negative for itching Neurological: Negative for dizziness, extremity weakness, gait problem, headaches, light-headedness and seizures.  Hematological: Negative for adenopathy. Does not bruise/bleed easily.  Psychiatric/Behavioral: Negative for confusion, depression and sleep disturbance. The patient is not nervous/anxious.       PHYSICAL EXAMINATION:  Blood pressure 117/76, pulse (!) 118, temperature 98.7 F (37.1 C), temperature source Temporal, resp. rate 18, height 5\' 2"  (1.575 m), weight 244 lb 8 oz (110.9 kg), SpO2 96 %.  ECOG PERFORMANCE STATUS: 1 - Symptomatic but completely ambulatory  Physical Exam  Constitutional: Oriented to person, place, and time and well-developed, well-nourished, and in no distress. On oxygen via nasal cannula HENT:  Head: Normocephalic and atraumatic.  Mouth/Throat: Oropharynx is clear and moist. No oropharyngeal exudate.  Eyes: Conjunctivae are normal. Right eye exhibits no discharge. Left eye exhibits no  discharge. No scleral icterus.  Neck: Normal range of motion. Neck supple.  Cardiovascular: Normal rate, regular rhythm, normal heart sounds and intact distal pulses.   Pulmonary/Chest: Effort normal. Decreased breath sounds in all lung fields. No respiratory distress. No wheezes. No rales.  Abdominal: Soft. Bowel sounds are normal. Exhibits no distension and no mass. There is no tenderness.  Musculoskeletal: Normal range of motion. Exhibits no edema.  Lymphadenopathy:    No cervical adenopathy.  Neurological: Alert and oriented to person, place, and time. Exhibits normal muscle tone. Gait normal. Coordination normal.  Skin: Skin is warm and dry. No rash noted. Not diaphoretic. No erythema. No pallor.  Psychiatric: Mood, memory and judgment normal.  Vitals  reviewed.  LABORATORY DATA: Lab Results  Component Value Date   WBC 8.9 06/17/2019   HGB 13.2 06/17/2019   HCT 42.9 06/17/2019   MCV 92.1 06/17/2019   PLT 289 06/17/2019      Chemistry      Component Value Date/Time   NA 145 06/17/2019 0829   NA 143 11/23/2016 1504   K 4.5 06/17/2019 0829   CL 101 06/17/2019 0829   CO2 28 06/17/2019 0829   BUN 22 06/17/2019 0829   BUN 14 11/23/2016 1504   CREATININE 1.06 (H) 06/17/2019 0829      Component Value Date/Time   CALCIUM 9.5 06/17/2019 0829   ALKPHOS 90 06/17/2019 0829   AST 19 06/17/2019 0829   ALT 23 06/17/2019 0829   BILITOT 0.2 (L) 06/17/2019 0829       RADIOGRAPHIC STUDIES:  No results found.   ASSESSMENT/PLAN:  This is a very pleasant 63 year old Caucasian female with stage IIIb non-small cell lung cancer, squamous cell carcinoma. She presented with a large left lower lobe lung mass in addition to mediastinal lymphadenopathy. She was diagnosed in October 2019. Her PDL 1 expression is negative.  She underwent a course of concurrent chemoradiation with weekly carboplatin for an AUC of 2 and paclitaxel 45 mg/m. She is status post 7 cycles. She tolerated treatment well without any adverse side effects.  She is currently undergoing consolidation immunotherapy with Imfinzi 10 mg/kg IV every 2 weeks. She is status post23cycles and has been tolerating it well without any adverse side effects except for dry skin for which she uses lotion.  Labs were reviewed with the patient. Recommend that she proceed with cycle #24today scheduled.  We will see her back for a follow up visit in 2 weeks for evaluation before starting cycle #25.   She will use lotion for her dry skin.   The patient was advised to call immediately if she has any concerning symptoms in the interval. The patient voices understanding of current disease status and treatment options and is in agreement with the current care plan. All questions were  answered. The patient knows to call the clinic with any problems, questions or concerns. We can certainly see the patient much sooner if necessary   No orders of the defined types were placed in this encounter.    Taijon Vink L Jovonne Wilton, PA-C 06/17/19

## 2019-06-17 ENCOUNTER — Inpatient Hospital Stay: Payer: BLUE CROSS/BLUE SHIELD | Attending: Internal Medicine | Admitting: Physician Assistant

## 2019-06-17 ENCOUNTER — Inpatient Hospital Stay: Payer: BLUE CROSS/BLUE SHIELD

## 2019-06-17 ENCOUNTER — Other Ambulatory Visit: Payer: Self-pay

## 2019-06-17 VITALS — HR 100

## 2019-06-17 VITALS — BP 117/76 | HR 118 | Temp 98.7°F | Resp 18 | Ht 62.0 in | Wt 244.5 lb

## 2019-06-17 DIAGNOSIS — Z79899 Other long term (current) drug therapy: Secondary | ICD-10-CM | POA: Diagnosis not present

## 2019-06-17 DIAGNOSIS — C3432 Malignant neoplasm of lower lobe, left bronchus or lung: Secondary | ICD-10-CM

## 2019-06-17 DIAGNOSIS — Z5112 Encounter for antineoplastic immunotherapy: Secondary | ICD-10-CM | POA: Diagnosis not present

## 2019-06-17 LAB — CBC WITH DIFFERENTIAL (CANCER CENTER ONLY)
Abs Immature Granulocytes: 0.03 10*3/uL (ref 0.00–0.07)
Basophils Absolute: 0 10*3/uL (ref 0.0–0.1)
Basophils Relative: 0 %
Eosinophils Absolute: 0.2 10*3/uL (ref 0.0–0.5)
Eosinophils Relative: 2 %
HCT: 42.9 % (ref 36.0–46.0)
Hemoglobin: 13.2 g/dL (ref 12.0–15.0)
Immature Granulocytes: 0 %
Lymphocytes Relative: 10 %
Lymphs Abs: 0.9 10*3/uL (ref 0.7–4.0)
MCH: 28.3 pg (ref 26.0–34.0)
MCHC: 30.8 g/dL (ref 30.0–36.0)
MCV: 92.1 fL (ref 80.0–100.0)
Monocytes Absolute: 0.6 10*3/uL (ref 0.1–1.0)
Monocytes Relative: 6 %
Neutro Abs: 7.2 10*3/uL (ref 1.7–7.7)
Neutrophils Relative %: 82 %
Platelet Count: 289 10*3/uL (ref 150–400)
RBC: 4.66 MIL/uL (ref 3.87–5.11)
RDW: 14.6 % (ref 11.5–15.5)
WBC Count: 8.9 10*3/uL (ref 4.0–10.5)
nRBC: 0 % (ref 0.0–0.2)

## 2019-06-17 LAB — CMP (CANCER CENTER ONLY)
ALT: 23 U/L (ref 0–44)
AST: 19 U/L (ref 15–41)
Albumin: 3.5 g/dL (ref 3.5–5.0)
Alkaline Phosphatase: 90 U/L (ref 38–126)
Anion gap: 16 — ABNORMAL HIGH (ref 5–15)
BUN: 22 mg/dL (ref 8–23)
CO2: 28 mmol/L (ref 22–32)
Calcium: 9.5 mg/dL (ref 8.9–10.3)
Chloride: 101 mmol/L (ref 98–111)
Creatinine: 1.06 mg/dL — ABNORMAL HIGH (ref 0.44–1.00)
GFR, Est AFR Am: >60 mL/min (ref >60)
GFR, Estimated: 57 mL/min — ABNORMAL LOW (ref >60)
Glucose, Bld: 139 mg/dL — ABNORMAL HIGH (ref 70–99)
Potassium: 4.5 mmol/L (ref 3.5–5.1)
Sodium: 145 mmol/L (ref 135–145)
Total Bilirubin: 0.2 mg/dL — ABNORMAL LOW (ref 0.3–1.2)
Total Protein: 7.9 g/dL (ref 6.5–8.1)

## 2019-06-17 LAB — TSH: TSH: 4.73 u[IU]/mL — ABNORMAL HIGH (ref 0.308–3.960)

## 2019-06-17 MED ORDER — SODIUM CHLORIDE 0.9 % IV SOLN
11.0000 mg/kg | Freq: Once | INTRAVENOUS | Status: AC
  Administered 2019-06-17: 1120 mg via INTRAVENOUS
  Filled 2019-06-17: qty 2.4

## 2019-06-17 MED ORDER — SODIUM CHLORIDE 0.9 % IV SOLN
Freq: Once | INTRAVENOUS | Status: AC
  Administered 2019-06-17: 10:00:00 via INTRAVENOUS
  Filled 2019-06-17: qty 250

## 2019-06-17 NOTE — Patient Instructions (Signed)
[facility] Discharge Instructions for Patients Receiving Chemotherapy  Today you received the following chemotherapy agents: Imfinzi.  To help prevent nausea and vomiting after your treatment, we encourage you to take your nausea medication as directed.   If you develop nausea and vomiting that is not controlled by your nausea medication, call the clinic.   BELOW ARE SYMPTOMS THAT SHOULD BE REPORTED IMMEDIATELY:  *FEVER GREATER THAN 100.5 F  *CHILLS WITH OR WITHOUT FEVER  NAUSEA AND VOMITING THAT IS NOT CONTROLLED WITH YOUR NAUSEA MEDICATION  *UNUSUAL SHORTNESS OF BREATH  *UNUSUAL BRUISING OR BLEEDING  TENDERNESS IN MOUTH AND THROAT WITH OR WITHOUT PRESENCE OF ULCERS  *URINARY PROBLEMS  *BOWEL PROBLEMS  UNUSUAL RASH Items with * indicate a potential emergency and should be followed up as soon as possible.  Feel free to call the clinic should you have any questions or concerns. The clinic phone number is [phone].  Please show the CHEMO ALERT CARD at check-in to the Emergency Department and triage nurse.   

## 2019-07-01 ENCOUNTER — Inpatient Hospital Stay: Payer: BLUE CROSS/BLUE SHIELD

## 2019-07-01 ENCOUNTER — Ambulatory Visit: Payer: BLUE CROSS/BLUE SHIELD | Admitting: Cardiovascular Disease

## 2019-07-01 ENCOUNTER — Encounter: Payer: Self-pay | Admitting: Cardiovascular Disease

## 2019-07-01 ENCOUNTER — Other Ambulatory Visit: Payer: Self-pay

## 2019-07-01 ENCOUNTER — Inpatient Hospital Stay (HOSPITAL_BASED_OUTPATIENT_CLINIC_OR_DEPARTMENT_OTHER): Payer: BLUE CROSS/BLUE SHIELD | Admitting: Internal Medicine

## 2019-07-01 ENCOUNTER — Encounter: Payer: Self-pay | Admitting: Internal Medicine

## 2019-07-01 VITALS — BP 134/82 | HR 88 | Temp 98.5°F | Resp 20 | Ht 62.0 in | Wt 246.7 lb

## 2019-07-01 VITALS — BP 102/67 | HR 113 | Ht 62.0 in | Wt 248.8 lb

## 2019-07-01 DIAGNOSIS — C3432 Malignant neoplasm of lower lobe, left bronchus or lung: Secondary | ICD-10-CM | POA: Diagnosis not present

## 2019-07-01 DIAGNOSIS — I10 Essential (primary) hypertension: Secondary | ICD-10-CM

## 2019-07-01 DIAGNOSIS — Z79899 Other long term (current) drug therapy: Secondary | ICD-10-CM | POA: Diagnosis not present

## 2019-07-01 DIAGNOSIS — I5032 Chronic diastolic (congestive) heart failure: Secondary | ICD-10-CM | POA: Diagnosis not present

## 2019-07-01 DIAGNOSIS — Z5112 Encounter for antineoplastic immunotherapy: Secondary | ICD-10-CM | POA: Diagnosis not present

## 2019-07-01 LAB — CMP (CANCER CENTER ONLY)
ALT: 23 U/L (ref 0–44)
AST: 24 U/L (ref 15–41)
Albumin: 3.4 g/dL — ABNORMAL LOW (ref 3.5–5.0)
Alkaline Phosphatase: 90 U/L (ref 38–126)
Anion gap: 14 (ref 5–15)
BUN: 24 mg/dL — ABNORMAL HIGH (ref 8–23)
CO2: 27 mmol/L (ref 22–32)
Calcium: 9 mg/dL (ref 8.9–10.3)
Chloride: 101 mmol/L (ref 98–111)
Creatinine: 1.03 mg/dL — ABNORMAL HIGH (ref 0.44–1.00)
GFR, Est AFR Am: >60 mL/min (ref >60)
GFR, Estimated: 59 mL/min — ABNORMAL LOW (ref >60)
Glucose, Bld: 131 mg/dL — ABNORMAL HIGH (ref 70–99)
Potassium: 4 mmol/L (ref 3.5–5.1)
Sodium: 142 mmol/L (ref 135–145)
Total Bilirubin: 0.3 mg/dL (ref 0.3–1.2)
Total Protein: 7.8 g/dL (ref 6.5–8.1)

## 2019-07-01 LAB — CBC WITH DIFFERENTIAL (CANCER CENTER ONLY)
Abs Immature Granulocytes: 0.05 10*3/uL (ref 0.00–0.07)
Basophils Absolute: 0 10*3/uL (ref 0.0–0.1)
Basophils Relative: 0 %
Eosinophils Absolute: 0.2 10*3/uL (ref 0.0–0.5)
Eosinophils Relative: 2 %
HCT: 41.8 % (ref 36.0–46.0)
Hemoglobin: 12.9 g/dL (ref 12.0–15.0)
Immature Granulocytes: 1 %
Lymphocytes Relative: 10 %
Lymphs Abs: 1 10*3/uL (ref 0.7–4.0)
MCH: 27.9 pg (ref 26.0–34.0)
MCHC: 30.9 g/dL (ref 30.0–36.0)
MCV: 90.5 fL (ref 80.0–100.0)
Monocytes Absolute: 0.6 10*3/uL (ref 0.1–1.0)
Monocytes Relative: 5 %
Neutro Abs: 8.3 10*3/uL — ABNORMAL HIGH (ref 1.7–7.7)
Neutrophils Relative %: 82 %
Platelet Count: 273 10*3/uL (ref 150–400)
RBC: 4.62 MIL/uL (ref 3.87–5.11)
RDW: 14.3 % (ref 11.5–15.5)
WBC Count: 10.1 10*3/uL (ref 4.0–10.5)
nRBC: 0 % (ref 0.0–0.2)

## 2019-07-01 LAB — TSH: TSH: 4.834 u[IU]/mL — ABNORMAL HIGH (ref 0.308–3.960)

## 2019-07-01 MED ORDER — SODIUM CHLORIDE 0.9 % IV SOLN
11.0000 mg/kg | Freq: Once | INTRAVENOUS | Status: AC
  Administered 2019-07-01: 14:00:00 1120 mg via INTRAVENOUS
  Filled 2019-07-01: qty 20

## 2019-07-01 MED ORDER — SODIUM CHLORIDE 0.9 % IV SOLN
Freq: Once | INTRAVENOUS | Status: AC
  Filled 2019-07-01: qty 250

## 2019-07-01 MED ORDER — FUROSEMIDE 20 MG PO TABS: 40.0000 mg | ORAL_TABLET | Freq: Every day | ORAL | 0 refills | Status: DC

## 2019-07-01 NOTE — Patient Instructions (Signed)
[facility] Discharge Instructions for Patients Receiving Chemotherapy  Today you received the following chemotherapy agents Imfinzi.  To help prevent nausea and vomiting after your treatment, we encourage you to take your nausea medication as directed.   If you develop nausea and vomiting that is not controlled by your nausea medication, call the clinic.   BELOW ARE SYMPTOMS THAT SHOULD BE REPORTED IMMEDIATELY:  *FEVER GREATER THAN 100.5 F  *CHILLS WITH OR WITHOUT FEVER  NAUSEA AND VOMITING THAT IS NOT CONTROLLED WITH YOUR NAUSEA MEDICATION  *UNUSUAL SHORTNESS OF BREATH  *UNUSUAL BRUISING OR BLEEDING  TENDERNESS IN MOUTH AND THROAT WITH OR WITHOUT PRESENCE OF ULCERS  *URINARY PROBLEMS  *BOWEL PROBLEMS  UNUSUAL RASH Items with * indicate a potential emergency and should be followed up as soon as possible.  Feel free to call the clinic you have any questions or concerns. The clinic phone number is [phone].  Please show the [facility] at check-in to the Emergency Department and triage nurse.

## 2019-07-01 NOTE — Patient Instructions (Addendum)
Medication Instructions:  Your physician has recommended you make the following change in your medication:   DECREASE Lasix to 40mg  daily.  *If you need a refill on your cardiac medications before your next appointment, please call your pharmacy*  Lab Work: None ordered If you have labs (blood work) drawn today and your tests are completely normal, you will receive your results only by: Marland Kitchen MyChart Message (if you have MyChart) OR . A paper copy in the mail If you have any lab test that is abnormal or we need to change your treatment, we will call you to review the results.  Testing/Procedures: None ordered  Follow-Up: At [facility], you and your health needs are our priority.  As part of our continuing mission to provide you with exceptional heart care, we have created designated Provider Care Teams.  These Care Teams include your primary Cardiologist (physician) and Advanced Practice Providers (APPs -  Physician Assistants and Nurse Practitioners) who all work together to provide you with the care you need, when you need it.  Your next appointment:   6 month(s)  The format for your next appointment:   In Person  Provider:    You may see Kathlyn Sacramento, MD or one of the following Advanced Practice Providers on your designated Care Team:    Murray Hodgkins, NP  Christell Faith, PA-C  Marrianne Mood, PA-C   Other Instructions N/A

## 2019-07-01 NOTE — Progress Notes (Signed)
[facility] [phone]   [phone]  OFFICE PROGRESS NOTE  [name], MD [place] [address]  DIAGNOSIS: stage IIIB (T3, N2, M0) non-small cell lung cancer, squamous cell carcinoma presented with large left lower lobe lung mass in addition to mediastinal lymphadenopathy diagnosed in October 2019. PDL 1 expression is negative  PRIOR THERAPY: Concurrent chemoradiation with weekly carboplatin for AUC of 2 and paclitaxel 45 mg/M2.  Status post 7 cycles with partial response.Marland Kitchen  CURRENT THERAPY: Consolidation treatment with immunotherapy with Imfinzi 10 mg/KG every 2 weeks.  First dose July 31, 2018.  Status post 24 cycles.  INTERVAL HISTORY: Deborah [name] 63 y.o. female returns to the clinic today for follow-up visit.  The patient is feeling fine today with no concerning complaints except for the baseline shortness of breath and she is currently on home oxygen.  The patient denied having any chest pain, cough or hemoptysis.  She denied having any fever or chills.  She has no nausea, vomiting, diarrhea or constipation.  She is here today for evaluation before starting cycle #25 of her consolidation treatment with Imfinzi.  MEDICAL HISTORY: Past Medical History:  Diagnosis Date  . Anemia    as teen  . Asthma   . CHF (congestive heart failure) ([place])   . COPD, mild ([place])   . Depression   . Diverticulitis   . Family history of anesthesia complication    vomiting  . GI bleeding   . Heart failure ([place])    New onset 07/25/14  . Histoplasmosis    left eye  . Hyperkalemia   . Hypertension   . Obesity (BMI 30-39.9)   . Pneumonia    dx wtih pneumonia on 05/27/16- seen by [facility]   . PONV (postoperative nausea and vomiting)   . Restrictive lung disease   . Shortness of breath    with exertion   . Sleep apnea    mask and oxygen at nite for sleep at 2L   . Tobacco abuse   . Umbilical hernia     ALLERGIES:  has  No Known Allergies.  MEDICATIONS:  Current Outpatient Medications  Medication Sig Dispense Refill  . acetaminophen (TYLENOL) 500 MG tablet Take 1,500 mg by mouth 2 (two) times daily as needed for moderate pain or headache.    . albuterol (PROVENTIL) (2.5 MG/3ML) 0.083% nebulizer solution USE 1 VIAL VIA NEBULIZER EVERY 3 HOURS AS NEEDED FOR WHEEZING OR SHORTNESS OF BREATH (Patient taking differently: Take 2.5 mg by nebulization every 3 (three) hours as needed for wheezing or shortness of breath. ) 1125 mL 1  . aspirin 81 MG chewable tablet Chew 1 tablet (81 mg total) by mouth daily. 30 tablet 1  . Cholecalciferol (D3 ADULT PO) Take 1,000 mg by mouth.    . diltiazem (CARDIZEM CD) 240 MG 24 hr capsule Take 1 capsule (240 mg total) by mouth daily. 30 capsule 6  . furosemide (LASIX) 20 MG tablet TAKE 2 TABLETS EACH MORNING AND 1 TAB AT 3 PM 270 tablet 0  . ipratropium-albuterol (DUONEB) 0.5-2.5 (3) MG/3ML SOLN USE 1 VIAL VIA NEBULIZER EVERY 6 HOURS AS NEEDED (Patient taking differently: Inhale 3 mLs into the lungs every 6 (six) hours as needed (shortness of breath or wheezing). ) 360 mL 2  . montelukast (SINGULAIR) 10 MG tablet TAKE 1 TABLET(10 MG) BY MOUTH AT BEDTIME 90 tablet 1  . OXYGEN Inhale 4 L/min into the  lungs continuous.     . potassium chloride SA (KLOR-CON M20) 20 MEQ tablet TAKE 1/2 TABLET BY MOUTH EVERY DAY 45 tablet 1  . prochlorperazine (COMPAZINE) 10 MG tablet TAKE 1 TABLET BY MOUTH EVERY 6 HOURS AS NEEDED FOR NAUSEA OR VOMITING. 30 tablet 0  . Spacer/Aero-Holding Chambers (AEROCHAMBER MV) inhaler Use as instructed 1 each 0  . VENTOLIN HFA 108 (90 Base) MCG/ACT inhaler INHALE 2 PUFFS BY MOUTH EVERY 6 HOURS AS NEEDED FOR WHEEZING OR SHORTNESS OF BREATH 18 g 5  . vitamin B-12 (CYANOCOBALAMIN) 500 MCG tablet Take 500 mcg by mouth daily.     No current facility-administered medications for this visit.    SURGICAL HISTORY:  Past Surgical History:  Procedure Laterality Date  .  APPLICATION OF WOUND VAC  03/19/2013   Procedure: APPLICATION OF WOUND VAC;  Surgeon: Madilyn Hook, DO;  Location: [facility];  Service: General;;  . CESAREAN SECTION  04/12/85  . COLONOSCOPY WITH PROPOFOL N/A 06/13/2016   Procedure: COLONOSCOPY WITH PROPOFOL;  Surgeon: Jerene Bears, MD;  [facility];  Service: Gastroenterology;  Laterality: N/A;  . ERCP N/A 02/15/2017   Procedure: ENDOSCOPIC RETROGRADE CHOLANGIOPANCREATOGRAPHY (ERCP);  Surgeon: [name], MD;  Location: [facility];  Service: Endoscopy;  Laterality: N/A;  . INSERTION OF MESH N/A 03/19/2013   Procedure: INSERTION OF MESH;  Surgeon: Madilyn Hook, DO;  [facility];  Service: General;  Laterality: N/A;  . VENTRAL HERNIA REPAIR N/A 03/19/2013   Procedure:  OPEN VENTRAL HERNIA REPAIR WITH MESH AND APPLICATION OF WOUND VAC;  Surgeon: Madilyn Hook, DO;  [facility]: [facility];  Service: General;  Laterality: N/A;  . VIDEO BRONCHOSCOPY Bilateral 04/19/2018   Procedure: VIDEO BRONCHOSCOPY WITH FLUORO;  Surgeon: Rigoberto Noel, MD;  [facility];  Service: Cardiopulmonary;  Laterality: Bilateral;    REVIEW OF SYSTEMS:  A comprehensive review of systems was negative except for: Respiratory: positive for dyspnea on exertion   PHYSICAL EXAMINATION: General appearance: alert, cooperative and no distress Head: Normocephalic, without obvious abnormality, atraumatic Neck: no adenopathy, no JVD, supple, symmetrical, trachea midline and thyroid not enlarged, symmetric, no tenderness/mass/nodules Lymph nodes: Cervical, supraclavicular, and axillary nodes normal. Resp: clear to auscultation bilaterally Back: symmetric, no curvature. ROM normal. No CVA tenderness. Cardio: regular rate and rhythm, S1, S2 normal, no murmur, click, rub or gallop GI: soft, non-tender; bowel sounds normal; no masses,  no organomegaly Extremities: extremities normal, atraumatic, no cyanosis or edema  ECOG PERFORMANCE STATUS: 1 - Symptomatic but  completely ambulatory  Blood pressure 134/82, pulse 88, temperature 98.5 F (36.9 C), temperature source Temporal, resp. rate 20, height 5\' 2"  (1.575 m), weight 246 lb 11.2 oz (111.9 kg), SpO2 92 %.  LABORATORY DATA: Lab Results  Component Value Date   WBC 10.1 07/01/2019   HGB 12.9 07/01/2019   HCT 41.8 07/01/2019   MCV 90.5 07/01/2019   PLT 273 07/01/2019      Chemistry      Component Value Date/Time   NA 142 07/01/2019 1102   NA 143 11/23/2016 1504   K 4.0 07/01/2019 1102   CL 101 07/01/2019 1102   CO2 27 07/01/2019 1102   BUN 24 (H) 07/01/2019 1102   BUN 14 11/23/2016 1504   CREATININE 1.03 (H) 07/01/2019 1102      Component Value Date/Time   CALCIUM 9.0 07/01/2019 1102   ALKPHOS 90 07/01/2019 1102   AST 24 07/01/2019 1102   ALT 23 07/01/2019 1102   BILITOT 0.3  07/01/2019 1102       RADIOGRAPHIC STUDIES: No results found.  ASSESSMENT AND PLAN: This is a very pleasant 63 years old white female with stage IIIB non-small cell lung cancer, squamous cell carcinoma. She underwent a course of concurrent chemoradiation with weekly carboplatin and paclitaxel, status post 7 cycles.  She tolerated this treatment well with no concerning adverse effect except for fatigue. She is currently undergoing consolidation treatment with immunotherapy with Imfinzi status post 24 cycles. The patient continues to tolerate this treatment well with no concerning adverse effects. I recommended for her to proceed with cycle #25 today as planned. I will see her back for follow-up visit in 2 weeks for evaluation before the last cycle of her consolidation immunotherapy. She was advised to call immediately if she has any concerning symptoms in the interval. The patient voices understanding of current disease status and treatment options and is in agreement with the current care plan.  All questions were answered. The patient knows to call the clinic with any problems, questions or concerns. We  can certainly see the patient much sooner if necessary.  Disclaimer: This note was dictated with voice recognition software. Similar sounding words can inadvertently be transcribed and may not be corrected upon review.

## 2019-07-01 NOTE — Progress Notes (Signed)
Cardiology Office Note   Date:  07/01/2019   ID:  Deborah, [name] 22-Jun-1956, MRN [id number]  PCP:  [name], MD  Cardiologist:   Kathlyn Sacramento, MD   Chief Complaint  Patient presents with  . other    6 month follow up. Meds reviewed by the pt. verbally. Pt. c/o shortness of breath but no more than usual.       History of Present Illness: Deborah [name] is a 63 y.o. female who presents for a follow-up visit regarding chronic diastolic heart failure and chronic right heart failure due to COPD. She has known history of previous tobacco use with COPD, hypertension, Obesity and bipolar disorder. She was hospitalized in January, 2016 for CHF.  She had moderate size left pleural effusion. Echocardiogram showed an ejection fraction of 44-81%, grade 1 diastolic dysfunction, dilated right ventricle with hypokinesis and mild pulmonary hypertension.  VQ scan was low probability for pulmonary embolism.    She quit smoking completely since then . She was diagnosed with squamous cell carcinoma of the lung , stage IIIb in October, 2019.  She had large left lower lobe mass in addition to mediastinal lymphadenopathy.  The patient was treated with radiation and chemotherapy which was recently finished. She is currently getting immunotherapy. During last visit, I increased the dose of diltiazem due to elevated blood pressure and sinus tachycardia. She has been doing reasonably well overall.  She has stable exertional dyspnea with no chest pain.  She continues to have sinus tachycardia with exertion.   Past Medical History:  Diagnosis Date  . Anemia    as teen  . Asthma   . CHF (congestive heart failure) ([place])   . COPD, mild ([place])   . Depression   . Diverticulitis   . Family history of anesthesia complication    vomiting  . GI bleeding   . Heart failure ([place])    New onset 07/25/14  . Histoplasmosis    left eye  . Hyperkalemia   . Hypertension   . Obesity (BMI 30-39.9)   .  Pneumonia    dx wtih pneumonia on 05/27/16- seen by [facility]   . PONV (postoperative nausea and vomiting)   . Restrictive lung disease   . Shortness of breath    with exertion   . Sleep apnea    mask and oxygen at nite for sleep at 2L   . Tobacco abuse   . Umbilical hernia     Past Surgical History:  Procedure Laterality Date  . APPLICATION OF WOUND VAC  03/19/2013   Procedure: APPLICATION OF WOUND VAC;  Surgeon: Madilyn Hook, DO;  Location: [facility];  Service: General;;  . CESAREAN SECTION  04/12/85  . COLONOSCOPY WITH PROPOFOL N/A 06/13/2016   Procedure: COLONOSCOPY WITH PROPOFOL;  Surgeon: Jerene Bears, MD;  [facility];  Service: Gastroenterology;  Laterality: N/A;  . ERCP N/A 02/15/2017   Procedure: ENDOSCOPIC RETROGRADE CHOLANGIOPANCREATOGRAPHY (ERCP);  Surgeon: [name], MD;  Location: [facility];  Service: Endoscopy;  Laterality: N/A;  . INSERTION OF MESH N/A 03/19/2013   Procedure: INSERTION OF MESH;  Surgeon: Madilyn Hook, DO;  [facility];  Service: General;  Laterality: N/A;  . VENTRAL HERNIA REPAIR N/A 03/19/2013   Procedure:  OPEN VENTRAL HERNIA REPAIR WITH MESH AND APPLICATION OF WOUND VAC;  Surgeon: Madilyn Hook, DO;  [facility];  Service: General;  Laterality: N/A;  . VIDEO BRONCHOSCOPY Bilateral 04/19/2018   Procedure:  VIDEO BRONCHOSCOPY WITH FLUORO;  Surgeon: Rigoberto Noel, MD;  Location: [facility];  Service: Cardiopulmonary;  Laterality: Bilateral;     Current Outpatient Medications  Medication Sig Dispense Refill  . acetaminophen (TYLENOL) 500 MG tablet Take 1,500 mg by mouth 2 (two) times daily as needed for moderate pain or headache.    . albuterol (PROVENTIL) (2.5 MG/3ML) 0.083% nebulizer solution USE 1 VIAL VIA NEBULIZER EVERY 3 HOURS AS NEEDED FOR WHEEZING OR SHORTNESS OF BREATH (Patient taking differently: Take 2.5 mg by nebulization every 3 (three) hours as needed for wheezing or shortness of breath. ) 1125 mL 1  . aspirin 81 MG  chewable tablet Chew 1 tablet (81 mg total) by mouth daily. 30 tablet 1  . Cholecalciferol (D3 ADULT PO) Take 1,000 mg by mouth.    . diltiazem (CARDIZEM CD) 240 MG 24 hr capsule Take 1 capsule (240 mg total) by mouth daily. 30 capsule 6  . furosemide (LASIX) 20 MG tablet TAKE 2 TABLETS EACH MORNING AND 1 TAB AT 3 PM 270 tablet 0  . ipratropium-albuterol (DUONEB) 0.5-2.5 (3) MG/3ML SOLN USE 1 VIAL VIA NEBULIZER EVERY 6 HOURS AS NEEDED (Patient taking differently: Inhale 3 mLs into the lungs every 6 (six) hours as needed (shortness of breath or wheezing). ) 360 mL 2  . montelukast (SINGULAIR) 10 MG tablet TAKE 1 TABLET(10 MG) BY MOUTH AT BEDTIME 90 tablet 1  . OXYGEN Inhale 4 L/min into the lungs continuous.     . potassium chloride SA (KLOR-CON M20) 20 MEQ tablet TAKE 1/2 TABLET BY MOUTH EVERY DAY 45 tablet 1  . prochlorperazine (COMPAZINE) 10 MG tablet TAKE 1 TABLET BY MOUTH EVERY 6 HOURS AS NEEDED FOR NAUSEA OR VOMITING. 30 tablet 0  . Spacer/Aero-Holding Chambers (AEROCHAMBER MV) inhaler Use as instructed 1 each 0  . VENTOLIN HFA 108 (90 Base) MCG/ACT inhaler INHALE 2 PUFFS BY MOUTH EVERY 6 HOURS AS NEEDED FOR WHEEZING OR SHORTNESS OF BREATH 18 g 5  . vitamin B-12 (CYANOCOBALAMIN) 500 MCG tablet Take 500 mcg by mouth daily.     No current facility-administered medications for this visit.    Allergies:   Patient has no known allergies.    Social History:  The patient  reports that she quit smoking about 6 years ago. Her smoking use included cigarettes. She has a 129.00 pack-year smoking history. She has never used smokeless tobacco. She reports that she does not drink alcohol or use drugs.   Family History:  The patient's family history includes Cancer in an other family member; Emphysema in her mother and another family member; Heart attack in her mother; Heart failure in an other family member; Lymphoma in her maternal uncle; Pancreatic cancer in her maternal aunt.    ROS:  Please see the  history of present illness.   Otherwise, review of systems are positive for none.   All other systems are reviewed and negative.    PHYSICAL EXAM: VS:  BP 102/67 (BP Location: Right Arm, Patient Position: Sitting, Cuff Size: Large)   Pulse (!) 113   Ht 5\' 2"  (1.575 m)   Wt 248 lb 12 oz (112.8 kg)   SpO2 98% Comment: on 3 liters of oxygen  BMI 45.50 kg/m  , BMI Body mass index is 45.5 kg/m. GEN: Well nourished, well developed, in no acute distress  HEENT: normal  Neck: no JVD, carotid bruits, or masses Cardiac: RRR; no murmurs, rubs, or gallops,no edema  Respiratory:  clear to auscultation bilaterally  with diminished breath sounds, normal work of breathing GI: soft, nontender, nondistended, + BS MS: no deformity or atrophy  Skin: warm and dry, no rash Neuro:  Strength and sensation are intact Psych: euthymic mood, full affect   EKG:  EKG is ordered today. The ekg ordered today demonstrates sinus tachycardia with low voltage and nonspecific ST and T wave changes.   Recent Labs: 07/01/2019: ALT 23; BUN 24; Creatinine 1.03; Hemoglobin 12.9; Platelet Count 273; Potassium 4.0; Sodium 142; TSH 4.834    Lipid Panel    Component Value Date/Time   CHOL 200 03/29/2015 1216   TRIG 98.0 03/29/2015 1216   HDL 44.60 03/29/2015 1216   CHOLHDL 4 03/29/2015 1216   VLDL 19.6 03/29/2015 1216   LDLCALC 136 (H) 03/29/2015 1216      Wt Readings from Last 3 Encounters:  07/01/19 248 lb 12 oz (112.8 kg)  07/01/19 246 lb 11.2 oz (111.9 kg)  06/17/19 244 lb 8 oz (110.9 kg)        ASSESSMENT AND PLAN:  1.  Chronic diastolic heart failure: No significant volume overload by exam.  She continues to be mildly tachycardic.  I reviewed her labs from today which showed normal CBC.  Basic metabolic profile showed borderline elevated creatinine at 1.01 with a BUN of 24 indicating mild volume depletion.  Her albumin was 3.4.  I elected to decrease furosemide to 40 mg once daily.  2. Essential  hypertension: Blood pressure is now controlled since increasing diltiazem during last visit.  3.  Stage IIIb lung cancer, status post radiation and chemotherapy.  Followed by oncology and currently getting immunotherapy.  Disposition:   FU with me in 6 months  Signed,  Kathlyn Sacramento, MD  07/01/2019 4:36 PM    [facility]

## 2019-07-03 ENCOUNTER — Other Ambulatory Visit: Payer: Self-pay | Admitting: Cardiovascular Disease

## 2019-07-14 NOTE — Progress Notes (Signed)
[facility] OFFICE PROGRESS NOTE  [name], MD [place] [address]  DIAGNOSIS: Stage IIIB (T3, N2, M0) non-small cell lung cancer, squamous cell carcinoma presented with large left lower lobe lung mass in addition to mediastinal lymphadenopathy diagnosed in October 2019. PDL 1 expression is negative  PRIOR THERAPY: Stage IIIB (T3, N2, M0) non-small cell lung cancer, squamous cell carcinoma presented with large left lower lobe lung mass in addition to mediastinal lymphadenopathy diagnosed in October 2019. PDL 1 expression is negative  CURRENT THERAPY: Consolidation treatment with immunotherapy with Imfinzi 10 mg/KG every 2 weeks. First dose July 31, 2018.Status post25cycles.  INTERVAL HISTORY: Deborah [name] 63 y.o. female returns to the clinic for a follow up visit. The patient is feeling well today without any concerning complaints except for her baseline shortness of breath with exertion and baseline cough secondary to her COPD. She is on home oxygen via nasal cannula. She is planning on scheduling an appointment with her pulmonologist soon. The patient continues to tolerate treatment with immunotherapy with Imfinziwell without any adverse sideeffects except for dry flaky skin, particularly on her face/forehead. Denies any fever, chills, night sweats, or weight loss. Denies any chest pain or hemoptysis.Denies any nausea, vomiting, diarrhea, or constipation. Denies any headache or visual changes. The patient is here today for evaluation prior to starting cycle #26.   MEDICAL HISTORY: Past Medical History:  Diagnosis Date  . Anemia    as teen  . Asthma   . CHF (congestive heart failure) ([place])   . COPD, mild ([place])   . Depression   . Diverticulitis   . Family history of anesthesia complication    vomiting  . GI bleeding   . Heart failure ([place])    New onset 07/25/14  . Histoplasmosis    left eye  . Hyperkalemia   . Hypertension    . Obesity (BMI 30-39.9)   . Pneumonia    dx wtih pneumonia on 05/27/16- seen by [facility]   . PONV (postoperative nausea and vomiting)   . Restrictive lung disease   . Shortness of breath    with exertion   . Sleep apnea    mask and oxygen at nite for sleep at 2L   . Tobacco abuse   . Umbilical hernia     ALLERGIES:  has No Known Allergies.  MEDICATIONS:  Current Outpatient Medications  Medication Sig Dispense Refill  . acetaminophen (TYLENOL) 500 MG tablet Take 1,500 mg by mouth 2 (two) times daily as needed for moderate pain or headache.    . albuterol (PROVENTIL) (2.5 MG/3ML) 0.083% nebulizer solution USE 1 VIAL VIA NEBULIZER EVERY 3 HOURS AS NEEDED FOR WHEEZING OR SHORTNESS OF BREATH (Patient taking differently: Take 2.5 mg by nebulization every 3 (three) hours as needed for wheezing or shortness of breath. ) 1125 mL 1  . aspirin 81 MG chewable tablet Chew 1 tablet (81 mg total) by mouth daily. 30 tablet 1  . Cholecalciferol (D3 ADULT PO) Take 1,000 mg by mouth.    . diltiazem (CARDIZEM CD) 240 MG 24 hr capsule TAKE 1 CAPSULE BY MOUTH EVERY DAY 90 capsule 2  . furosemide (LASIX) 20 MG tablet Take 2 tablets (40 mg total) by mouth daily.  0  . ipratropium-albuterol (DUONEB) 0.5-2.5 (3) MG/3ML SOLN USE 1 VIAL VIA NEBULIZER EVERY 6 HOURS AS NEEDED (Patient taking differently: Inhale 3 mLs into the lungs every 6 (six) hours as needed (shortness of breath or  wheezing). ) 360 mL 2  . montelukast (SINGULAIR) 10 MG tablet TAKE 1 TABLET(10 MG) BY MOUTH AT BEDTIME 90 tablet 1  . OXYGEN Inhale 4 L/min into the lungs continuous.     . potassium chloride SA (KLOR-CON M20) 20 MEQ tablet TAKE 1/2 TABLET BY MOUTH EVERY DAY 45 tablet 1  . prochlorperazine (COMPAZINE) 10 MG tablet TAKE 1 TABLET BY MOUTH EVERY 6 HOURS AS NEEDED FOR NAUSEA OR VOMITING. 30 tablet 0  . Spacer/Aero-Holding Chambers (AEROCHAMBER MV) inhaler Use as instructed 1 each 0  . VENTOLIN HFA 108 (90 Base) MCG/ACT inhaler INHALE 2  PUFFS BY MOUTH EVERY 6 HOURS AS NEEDED FOR WHEEZING OR SHORTNESS OF BREATH 18 g 5  . vitamin B-12 (CYANOCOBALAMIN) 500 MCG tablet Take 500 mcg by mouth daily.     No current facility-administered medications for this visit.   Facility-Administered Medications Ordered in Other Visits  Medication Dose Route Frequency Provider Last Rate Last Admin  . durvalumab (IMFINZI) 1,120 mg in sodium chloride 0.9 % 100 mL chemo infusion  11 mg/kg (Treatment Plan Recorded) Intravenous Once Curt Bears, MD 122 mL/hr at 07/15/19 1141 1,120 mg at 07/15/19 1141    SURGICAL HISTORY:  Past Surgical History:  Procedure Laterality Date  . APPLICATION OF WOUND VAC  03/19/2013   Procedure: APPLICATION OF WOUND VAC;  Surgeon: Madilyn Hook, DO;  [facility];  Service: General;;  . CESAREAN SECTION  04/12/85  . COLONOSCOPY WITH PROPOFOL N/A 06/13/2016   Procedure: COLONOSCOPY WITH PROPOFOL;  Surgeon: Jerene Bears, MD;  [facility];  Service: Gastroenterology;  Laterality: N/A;  . ERCP N/A 02/15/2017   Procedure: ENDOSCOPIC RETROGRADE CHOLANGIOPANCREATOGRAPHY (ERCP);  Surgeon: [name], MD;  Location: [facility];  Service: Endoscopy;  Laterality: N/A;  . INSERTION OF MESH N/A 03/19/2013   Procedure: INSERTION OF MESH;  Surgeon: Madilyn Hook, DO;  [facility];  Service: General;  Laterality: N/A;  . VENTRAL HERNIA REPAIR N/A 03/19/2013   Procedure:  OPEN VENTRAL HERNIA REPAIR WITH MESH AND APPLICATION OF WOUND VAC;  Surgeon: Madilyn Hook, DO;  [facility];  Service: General;  Laterality: N/A;  . VIDEO BRONCHOSCOPY Bilateral 04/19/2018   Procedure: VIDEO BRONCHOSCOPY WITH FLUORO;  Surgeon: Rigoberto Noel, MD;  [facility];  Service: Cardiopulmonary;  Laterality: Bilateral;    REVIEW OF SYSTEMS:   Review of Systems  Constitutional: Positive for fatigue. Negative for appetite change, chills, fever and unexpected weight change.  HENT: Negative for mouth sores, nosebleeds, sore  throat and trouble swallowing.  Eyes: Negative for eye problems and icterus.  Respiratory:Positive for baseline shortness of breath and cough.Negative for hemoptysis and wheezing.  Cardiovascular: Negative for chest pain and leg swelling.  Gastrointestinal: Negative for abdominal pain, constipation, diarrhea, nausea and vomiting.  Genitourinary: Negative for bladder incontinence, difficulty urinating, dysuria, frequency and hematuria.  Musculoskeletal: Negative for back pain, gait problem, neck pain and neck stiffness.  Skin:Positive for dry skin.Negative for itching Neurological: Negative for dizziness, extremity weakness, gait problem, headaches, light-headedness and seizures.  Hematological: Negative for adenopathy. Does not bruise/bleed easily.  Psychiatric/Behavioral: Negative for confusion, depression and sleep disturbance. The patient is not nervous/anxious.       PHYSICAL EXAMINATION:  Blood pressure 137/82, pulse (!) 120, temperature 98.9 F (37.2 C), temperature source Temporal, resp. rate 18, height 5\' 2"  (1.575 m), weight 251 lb 1.6 oz (113.9 kg), SpO2 100 %.  ECOG PERFORMANCE STATUS: 1 - Symptomatic but completely ambulatory  Physical Exam  Constitutional: Oriented to person, place, and time and well-developed, well-nourished, and in no distress.On oxygen via nasal cannula HENT:  Head: Normocephalic and atraumatic.  Mouth/Throat: Oropharynx is clear and moist. No oropharyngeal exudate.  Eyes: Conjunctivae are normal. Right eye exhibits no discharge. Left eye exhibits no discharge. No scleral icterus.  Neck: Normal range of motion. Neck supple.  Cardiovascular: Normal rate, regular rhythm, normal heart sounds and intact distal pulses.  Pulmonary/Chest: Effort normal. Decreased breath sounds in all lung fields.No respiratory distress. No wheezes. No rales.  Abdominal: Soft. Bowel sounds are normal. Exhibits no distension and no mass. There is no tenderness.   Musculoskeletal: Normal range of motion. Exhibits no edema.  Lymphadenopathy:  No cervical adenopathy.  Neurological: Alert and oriented to person, place, and time. Exhibits normal muscle tone. Gait normal. Coordination normal.  Skin: Skin is warm and dry. No rash noted. Not diaphoretic. No erythema. No pallor.  Psychiatric: Mood, memory and judgment normal.  Vitals reviewed.  LABORATORY DATA: Lab Results  Component Value Date   WBC 7.8 07/15/2019   HGB 12.5 07/15/2019   HCT 41.6 07/15/2019   MCV 93.1 07/15/2019   PLT 264 07/15/2019      Chemistry      Component Value Date/Time   NA 144 07/15/2019 1008   NA 143 11/23/2016 1504   K 4.2 07/15/2019 1008   CL 104 07/15/2019 1008   CO2 28 07/15/2019 1008   BUN 14 07/15/2019 1008   BUN 14 11/23/2016 1504   CREATININE 0.84 07/15/2019 1008      Component Value Date/Time   CALCIUM 8.8 (L) 07/15/2019 1008   ALKPHOS 90 07/15/2019 1008   AST 14 (L) 07/15/2019 1008   ALT 15 07/15/2019 1008   BILITOT 0.3 07/15/2019 1008       RADIOGRAPHIC STUDIES:  No results found.   ASSESSMENT/PLAN:  This is a very pleasant 63 year old Caucasian female with stage IIIb non-small cell lung cancer, squamous cell carcinoma. She presented with a large left lower lobe lung mass in addition to mediastinal lymphadenopathy. She was diagnosed in October 2019. Her PDL 1 expression is negative.  She underwent a course of concurrent chemoradiation with weekly carboplatin for an AUC of 2 and paclitaxel 45 mg/m. She is status post 7 cycles. She tolerated treatment well without any adverse side effects.  She is currently undergoing consolidation immunotherapy with Imfinzi 10 mg/kg IV every 2 weeks. She is status post25cycles and has been tolerating it well without any adverse side effects except for dry skin for which she uses lotion.  Labs were reviewed with the patient. Recommend that she proceed with cycle #26today scheduled.  I will  arrange for a restaging CT scan before her next visit.   We will see her back for a follow up visit in 2 weeks for evaluation and to review her CT scan.   She will use lotion for her dry skin.  She will reach out to her pulmonologist regarding management for her COPD.   The patient was advised to call immediately if she has any concerning symptoms in the interval. The patient voices understanding of current disease status and treatment options and is in agreement with the current care plan. All questions were answered. The patient knows to call the clinic with any problems, questions or concerns. We can certainly see the patient much sooner if necessary  Orders Placed This Encounter  Procedures  . CT Chest W Contrast    Standing Status:   Future  Standing Expiration Date:   07/14/2020    Order Specific Question:   ** REASON FOR EXAM (FREE TEXT)    Answer:   Restaging Lung Cancer    Order Specific Question:   If indicated for the ordered procedure, I authorize the administration of contrast media per Radiology protocol    Answer:   Yes    Order Specific Question:   Preferred imaging location?    Answer:   [facility]    Order Specific Question:   Radiology Contrast Protocol - do NOT remove file path    Answer:   [email or web address]     Mitchell, PA-C 07/15/19

## 2019-07-15 ENCOUNTER — Inpatient Hospital Stay: Payer: BLUE CROSS/BLUE SHIELD

## 2019-07-15 ENCOUNTER — Other Ambulatory Visit: Payer: Self-pay

## 2019-07-15 ENCOUNTER — Inpatient Hospital Stay: Payer: BLUE CROSS/BLUE SHIELD | Admitting: Physician Assistant

## 2019-07-15 VITALS — BP 137/82 | HR 120 | Temp 98.9°F | Resp 18 | Ht 62.0 in | Wt 251.1 lb

## 2019-07-15 VITALS — HR 100

## 2019-07-15 DIAGNOSIS — Z5112 Encounter for antineoplastic immunotherapy: Secondary | ICD-10-CM | POA: Diagnosis not present

## 2019-07-15 DIAGNOSIS — Z79899 Other long term (current) drug therapy: Secondary | ICD-10-CM | POA: Diagnosis not present

## 2019-07-15 DIAGNOSIS — C3432 Malignant neoplasm of lower lobe, left bronchus or lung: Secondary | ICD-10-CM | POA: Diagnosis not present

## 2019-07-15 DIAGNOSIS — C3492 Malignant neoplasm of unspecified part of left bronchus or lung: Secondary | ICD-10-CM | POA: Diagnosis not present

## 2019-07-15 LAB — CMP (CANCER CENTER ONLY)
ALT: 15 U/L (ref 0–44)
AST: 14 U/L — ABNORMAL LOW (ref 15–41)
Albumin: 3.3 g/dL — ABNORMAL LOW (ref 3.5–5.0)
Alkaline Phosphatase: 90 U/L (ref 38–126)
Anion gap: 12 (ref 5–15)
BUN: 14 mg/dL (ref 8–23)
CO2: 28 mmol/L (ref 22–32)
Calcium: 8.8 mg/dL — ABNORMAL LOW (ref 8.9–10.3)
Chloride: 104 mmol/L (ref 98–111)
Creatinine: 0.84 mg/dL (ref 0.44–1.00)
GFR, Est AFR Am: >60 mL/min (ref >60)
GFR, Estimated: >60 mL/min (ref >60)
Glucose, Bld: 115 mg/dL — ABNORMAL HIGH (ref 70–99)
Potassium: 4.2 mmol/L (ref 3.5–5.1)
Sodium: 144 mmol/L (ref 135–145)
Total Bilirubin: 0.3 mg/dL (ref 0.3–1.2)
Total Protein: 7.5 g/dL (ref 6.5–8.1)

## 2019-07-15 LAB — CBC WITH DIFFERENTIAL (CANCER CENTER ONLY)
Abs Immature Granulocytes: 0.03 10*3/uL (ref 0.00–0.07)
Basophils Absolute: 0 10*3/uL (ref 0.0–0.1)
Basophils Relative: 0 %
Eosinophils Absolute: 0.1 10*3/uL (ref 0.0–0.5)
Eosinophils Relative: 2 %
HCT: 41.6 % (ref 36.0–46.0)
Hemoglobin: 12.5 g/dL (ref 12.0–15.0)
Immature Granulocytes: 0 %
Lymphocytes Relative: 9 %
Lymphs Abs: 0.7 10*3/uL (ref 0.7–4.0)
MCH: 28 pg (ref 26.0–34.0)
MCHC: 30 g/dL (ref 30.0–36.0)
MCV: 93.1 fL (ref 80.0–100.0)
Monocytes Absolute: 0.5 10*3/uL (ref 0.1–1.0)
Monocytes Relative: 6 %
Neutro Abs: 6.5 10*3/uL (ref 1.7–7.7)
Neutrophils Relative %: 83 %
Platelet Count: 264 10*3/uL (ref 150–400)
RBC: 4.47 MIL/uL (ref 3.87–5.11)
RDW: 14.5 % (ref 11.5–15.5)
WBC Count: 7.8 10*3/uL (ref 4.0–10.5)
nRBC: 0 % (ref 0.0–0.2)

## 2019-07-15 MED ORDER — SODIUM CHLORIDE 0.9 % IV SOLN
Freq: Once | INTRAVENOUS | Status: AC
  Filled 2019-07-15: qty 250

## 2019-07-15 MED ORDER — SODIUM CHLORIDE 0.9 % IV SOLN
11.0000 mg/kg | Freq: Once | INTRAVENOUS | Status: AC
  Administered 2019-07-15: 1120 mg via INTRAVENOUS
  Filled 2019-07-15: qty 20

## 2019-07-15 NOTE — Patient Instructions (Signed)
[facility] Discharge Instructions for Patients Receiving Chemotherapy  Today you received the following chemotherapy agents Imfinzi.  To help prevent nausea and vomiting after your treatment, we encourage you to take your nausea medication as directed.   If you develop nausea and vomiting that is not controlled by your nausea medication, call the clinic.   BELOW ARE SYMPTOMS THAT SHOULD BE REPORTED IMMEDIATELY:  *FEVER GREATER THAN 100.5 F  *CHILLS WITH OR WITHOUT FEVER  NAUSEA AND VOMITING THAT IS NOT CONTROLLED WITH YOUR NAUSEA MEDICATION  *UNUSUAL SHORTNESS OF BREATH  *UNUSUAL BRUISING OR BLEEDING  TENDERNESS IN MOUTH AND THROAT WITH OR WITHOUT PRESENCE OF ULCERS  *URINARY PROBLEMS  *BOWEL PROBLEMS  UNUSUAL RASH Items with * indicate a potential emergency and should be followed up as soon as possible.  Feel free to call the clinic you have any questions or concerns. The clinic phone number is [phone].  Please show the [place] at check-in to the Emergency Department and triage nurse.

## 2019-07-16 ENCOUNTER — Telehealth: Payer: Self-pay | Admitting: Internal Medicine

## 2019-07-16 NOTE — Telephone Encounter (Signed)
Schedule per 12/29 los. Called and spoke with pt, confirmed appt

## 2019-07-17 ENCOUNTER — Telehealth: Payer: Self-pay | Admitting: Internal Medicine

## 2019-07-17 NOTE — Telephone Encounter (Signed)
Returned patient's phone call regarding rescheduling  01/12 lab appointment time, per patient's request appointment time rescheduled.

## 2019-07-28 ENCOUNTER — Other Ambulatory Visit: Payer: Self-pay | Admitting: Emergency Medicine

## 2019-07-28 DIAGNOSIS — C9000 Multiple myeloma not having achieved remission: Secondary | ICD-10-CM

## 2019-07-29 ENCOUNTER — Ambulatory Visit (HOSPITAL_COMMUNITY)
Admission: RE | Admit: 2019-07-29 | Discharge: 2019-07-29 | Disposition: A | Discharge status: Home / Self Care | Payer: BLUE CROSS/BLUE SHIELD | Source: Ambulatory Visit | Attending: Physician Assistant | Admitting: Physician Assistant

## 2019-07-29 ENCOUNTER — Inpatient Hospital Stay: Payer: BLUE CROSS/BLUE SHIELD | Attending: Internal Medicine

## 2019-07-29 ENCOUNTER — Other Ambulatory Visit: Payer: BLUE CROSS/BLUE SHIELD

## 2019-07-29 ENCOUNTER — Other Ambulatory Visit: Payer: Self-pay

## 2019-07-29 DIAGNOSIS — C3432 Malignant neoplasm of lower lobe, left bronchus or lung: Secondary | ICD-10-CM | POA: Insufficient documentation

## 2019-07-29 DIAGNOSIS — I509 Heart failure, unspecified: Secondary | ICD-10-CM | POA: Insufficient documentation

## 2019-07-29 DIAGNOSIS — J449 Chronic obstructive pulmonary disease, unspecified: Secondary | ICD-10-CM | POA: Insufficient documentation

## 2019-07-29 DIAGNOSIS — C3492 Malignant neoplasm of unspecified part of left bronchus or lung: Secondary | ICD-10-CM

## 2019-07-29 DIAGNOSIS — C9000 Multiple myeloma not having achieved remission: Secondary | ICD-10-CM

## 2019-07-29 DIAGNOSIS — I110 Hypertensive heart disease with heart failure: Secondary | ICD-10-CM | POA: Diagnosis not present

## 2019-07-29 DIAGNOSIS — Z923 Personal history of irradiation: Secondary | ICD-10-CM | POA: Insufficient documentation

## 2019-07-29 DIAGNOSIS — Z79899 Other long term (current) drug therapy: Secondary | ICD-10-CM | POA: Insufficient documentation

## 2019-07-29 DIAGNOSIS — Z9221 Personal history of antineoplastic chemotherapy: Secondary | ICD-10-CM | POA: Diagnosis not present

## 2019-07-29 DIAGNOSIS — C3411 Malignant neoplasm of upper lobe, right bronchus or lung: Secondary | ICD-10-CM | POA: Diagnosis not present

## 2019-07-29 DIAGNOSIS — R911 Solitary pulmonary nodule: Secondary | ICD-10-CM | POA: Diagnosis not present

## 2019-07-29 LAB — CBC WITH DIFFERENTIAL (CANCER CENTER ONLY)
Abs Immature Granulocytes: 0.04 10*3/uL (ref 0.00–0.07)
Basophils Absolute: 0 10*3/uL (ref 0.0–0.1)
Basophils Relative: 0 %
Eosinophils Absolute: 0.2 10*3/uL (ref 0.0–0.5)
Eosinophils Relative: 2 %
HCT: 42 % (ref 36.0–46.0)
Hemoglobin: 13 g/dL (ref 12.0–15.0)
Immature Granulocytes: 0 %
Lymphocytes Relative: 12 %
Lymphs Abs: 1.1 10*3/uL (ref 0.7–4.0)
MCH: 28.3 pg (ref 26.0–34.0)
MCHC: 31 g/dL (ref 30.0–36.0)
MCV: 91.5 fL (ref 80.0–100.0)
Monocytes Absolute: 0.6 10*3/uL (ref 0.1–1.0)
Monocytes Relative: 7 %
Neutro Abs: 7.1 10*3/uL (ref 1.7–7.7)
Neutrophils Relative %: 79 %
Platelet Count: 294 10*3/uL (ref 150–400)
RBC: 4.59 MIL/uL (ref 3.87–5.11)
RDW: 14.4 % (ref 11.5–15.5)
WBC Count: 9 10*3/uL (ref 4.0–10.5)
nRBC: 0 % (ref 0.0–0.2)

## 2019-07-29 LAB — CMP (CANCER CENTER ONLY)
ALT: 14 U/L (ref 0–44)
AST: 14 U/L — ABNORMAL LOW (ref 15–41)
Albumin: 3.6 g/dL (ref 3.5–5.0)
Alkaline Phosphatase: 102 U/L (ref 38–126)
Anion gap: 15 (ref 5–15)
BUN: 16 mg/dL (ref 8–23)
CO2: 28 mmol/L (ref 22–32)
Calcium: 8.7 mg/dL — ABNORMAL LOW (ref 8.9–10.3)
Chloride: 100 mmol/L (ref 98–111)
Creatinine: 0.94 mg/dL (ref 0.44–1.00)
GFR, Est AFR Am: >60 mL/min (ref >60)
GFR, Estimated: >60 mL/min (ref >60)
Glucose, Bld: 116 mg/dL — ABNORMAL HIGH (ref 70–99)
Potassium: 4.3 mmol/L (ref 3.5–5.1)
Sodium: 143 mmol/L (ref 135–145)
Total Bilirubin: <0.2 mg/dL — ABNORMAL LOW (ref 0.3–1.2)
Total Protein: 7.9 g/dL (ref 6.5–8.1)

## 2019-07-29 LAB — TSH: TSH: 0.187 u[IU]/mL — ABNORMAL LOW (ref 0.308–3.960)

## 2019-07-29 MED ORDER — SODIUM CHLORIDE (PF) 0.9 % IJ SOLN
INTRAMUSCULAR | Status: AC
  Filled 2019-07-29: qty 50

## 2019-07-29 MED ORDER — IOHEXOL 300 MG/ML  SOLN
75.0000 mL | Freq: Once | INTRAMUSCULAR | Status: AC | PRN
  Administered 2019-07-29: 75 mL via INTRAVENOUS

## 2019-07-31 ENCOUNTER — Inpatient Hospital Stay: Payer: BLUE CROSS/BLUE SHIELD | Admitting: Internal Medicine

## 2019-07-31 ENCOUNTER — Encounter: Payer: Self-pay | Admitting: Internal Medicine

## 2019-07-31 ENCOUNTER — Other Ambulatory Visit: Payer: Self-pay

## 2019-07-31 VITALS — BP 141/86 | HR 95 | Temp 98.5°F | Resp 18 | Ht 62.0 in | Wt 250.6 lb

## 2019-07-31 DIAGNOSIS — C3490 Malignant neoplasm of unspecified part of unspecified bronchus or lung: Secondary | ICD-10-CM | POA: Diagnosis not present

## 2019-07-31 DIAGNOSIS — I10 Essential (primary) hypertension: Secondary | ICD-10-CM | POA: Diagnosis not present

## 2019-07-31 DIAGNOSIS — Z79899 Other long term (current) drug therapy: Secondary | ICD-10-CM | POA: Diagnosis not present

## 2019-07-31 DIAGNOSIS — Z923 Personal history of irradiation: Secondary | ICD-10-CM | POA: Diagnosis not present

## 2019-07-31 DIAGNOSIS — J449 Chronic obstructive pulmonary disease, unspecified: Secondary | ICD-10-CM | POA: Diagnosis not present

## 2019-07-31 DIAGNOSIS — I509 Heart failure, unspecified: Secondary | ICD-10-CM | POA: Diagnosis not present

## 2019-07-31 DIAGNOSIS — I110 Hypertensive heart disease with heart failure: Secondary | ICD-10-CM | POA: Diagnosis not present

## 2019-07-31 DIAGNOSIS — C3432 Malignant neoplasm of lower lobe, left bronchus or lung: Secondary | ICD-10-CM

## 2019-07-31 DIAGNOSIS — Z9221 Personal history of antineoplastic chemotherapy: Secondary | ICD-10-CM | POA: Diagnosis not present

## 2019-07-31 NOTE — Progress Notes (Signed)
[place] [phone]) [phone]   [phone]  OFFICE PROGRESS NOTE  [name], MD [place] [address]  DIAGNOSIS: stage IIIB (T3, N2, M0) non-small cell lung cancer, squamous cell carcinoma presented with large left lower lobe lung mass in addition to mediastinal lymphadenopathy diagnosed in October 2019. PDL 1 expression is negative  PRIOR THERAPY:  1) Concurrent chemoradiation with weekly carboplatin for AUC of 2 and paclitaxel 45 mg/M2.  Status post 7 cycles with partial response. 2) Consolidation treatment with immunotherapy with Imfinzi 10 mg/KG every 2 weeks.  First dose July 31, 2018.  Status post 26 cycles.  CURRENT THERAPY: Observation.  INTERVAL HISTORY: Deborah [name] 64 y.o. female returns to the clinic today for follow-up visit.  The patient was accompanied by her husband.  She is feeling fine today with no concerning complaints except for the baseline shortness of breath and she is currently on home oxygen.  She denied having any chest pain, cough or hemoptysis.  She denied having any fever or chills.  She has no nausea, vomiting, diarrhea or constipation.  She has no weight loss or night sweats.  She tolerated the previous course of consolidation treatment with immunotherapy fairly well.  She had repeat CT scan of the chest performed recently and she is here for evaluation and discussion of her risk her results.   MEDICAL HISTORY: Past Medical History:  Diagnosis Date  . Anemia    as teen  . Asthma   . CHF (congestive heart failure) ([place])   . COPD, mild ([place])   . Depression   . Diverticulitis   . Family history of anesthesia complication    vomiting  . GI bleeding   . Heart failure ([place])    New onset 07/25/14  . Histoplasmosis    left eye  . Hyperkalemia   . Hypertension   . Obesity (BMI 30-39.9)   . Pneumonia    dx wtih pneumonia on 05/27/16- seen by [facility]   . PONV (postoperative nausea and  vomiting)   . Restrictive lung disease   . Shortness of breath    with exertion   . Sleep apnea    mask and oxygen at nite for sleep at 2L   . Tobacco abuse   . Umbilical hernia     ALLERGIES:  has No Known Allergies.  MEDICATIONS:  Current Outpatient Medications  Medication Sig Dispense Refill  . acetaminophen (TYLENOL) 500 MG tablet Take 1,500 mg by mouth 2 (two) times daily as needed for moderate pain or headache.    . albuterol (PROVENTIL) (2.5 MG/3ML) 0.083% nebulizer solution USE 1 VIAL VIA NEBULIZER EVERY 3 HOURS AS NEEDED FOR WHEEZING OR SHORTNESS OF BREATH (Patient taking differently: Take 2.5 mg by nebulization every 3 (three) hours as needed for wheezing or shortness of breath. ) 1125 mL 1  . aspirin 81 MG chewable tablet Chew 1 tablet (81 mg total) by mouth daily. 30 tablet 1  . Cholecalciferol (D3 ADULT PO) Take 1,000 mg by mouth.    . diltiazem (CARDIZEM CD) 240 MG 24 hr capsule TAKE 1 CAPSULE BY MOUTH EVERY DAY 90 capsule 2  . furosemide (LASIX) 20 MG tablet Take 2 tablets (40 mg total) by mouth daily.  0  . ipratropium-albuterol (DUONEB) 0.5-2.5 (3) MG/3ML SOLN USE 1 VIAL VIA NEBULIZER EVERY 6 HOURS AS NEEDED (Patient taking differently: Inhale 3 mLs into the lungs every 6 (six) hours as needed (  shortness of breath or wheezing). ) 360 mL 2  . montelukast (SINGULAIR) 10 MG tablet TAKE 1 TABLET(10 MG) BY MOUTH AT BEDTIME 90 tablet 1  . OXYGEN Inhale 4 L/min into the lungs continuous.     . potassium chloride SA (KLOR-CON M20) 20 MEQ tablet TAKE 1/2 TABLET BY MOUTH EVERY DAY 45 tablet 1  . prochlorperazine (COMPAZINE) 10 MG tablet TAKE 1 TABLET BY MOUTH EVERY 6 HOURS AS NEEDED FOR NAUSEA OR VOMITING. 30 tablet 0  . Spacer/Aero-Holding Chambers (AEROCHAMBER MV) inhaler Use as instructed 1 each 0  . VENTOLIN HFA 108 (90 Base) MCG/ACT inhaler INHALE 2 PUFFS BY MOUTH EVERY 6 HOURS AS NEEDED FOR WHEEZING OR SHORTNESS OF BREATH 18 g 5  . vitamin B-12 (CYANOCOBALAMIN) 500 MCG tablet  Take 500 mcg by mouth daily.     No current facility-administered medications for this visit.    SURGICAL HISTORY:  Past Surgical History:  Procedure Laterality Date  . APPLICATION OF WOUND VAC  03/19/2013   Procedure: APPLICATION OF WOUND VAC;  Surgeon: Madilyn Hook, DO;  Location: [facility];  Service: General;;  . CESAREAN SECTION  04/12/85  . COLONOSCOPY WITH PROPOFOL N/A 06/13/2016   Procedure: COLONOSCOPY WITH PROPOFOL;  Surgeon: Jerene Bears, MD;  Location: [facility];  Service: Gastroenterology;  Laterality: N/A;  . ERCP N/A 02/15/2017   Procedure: ENDOSCOPIC RETROGRADE CHOLANGIOPANCREATOGRAPHY (ERCP);  Surgeon: [name], MD;  Location: [facility];  Service: Endoscopy;  Laterality: N/A;  . INSERTION OF MESH N/A 03/19/2013   Procedure: INSERTION OF MESH;  Surgeon: Madilyn Hook, DO;  Location: [facility];  Service: General;  Laterality: N/A;  . VENTRAL HERNIA REPAIR N/A 03/19/2013   Procedure:  OPEN VENTRAL HERNIA REPAIR WITH MESH AND APPLICATION OF WOUND VAC;  Surgeon: Madilyn Hook, DO;  Location: [facility];  Service: General;  Laterality: N/A;  . VIDEO BRONCHOSCOPY Bilateral 04/19/2018   Procedure: VIDEO BRONCHOSCOPY WITH FLUORO;  Surgeon: Rigoberto Noel, MD;  Location: [facility];  Service: Cardiopulmonary;  Laterality: Bilateral;    REVIEW OF SYSTEMS:  Constitutional: positive for fatigue Eyes: negative Ears, nose, mouth, throat, and face: negative Respiratory: positive for dyspnea on exertion Cardiovascular: negative Gastrointestinal: negative Genitourinary:negative Integument/breast: negative Hematologic/lymphatic: negative Musculoskeletal:negative Neurological: negative Behavioral/Psych: negative Endocrine: negative Allergic/Immunologic: negative   PHYSICAL EXAMINATION: General appearance: alert, cooperative and no distress Head: Normocephalic, without obvious abnormality, atraumatic Neck: no adenopathy, no JVD, supple, symmetrical, trachea midline and thyroid not  enlarged, symmetric, no tenderness/mass/nodules Lymph nodes: Cervical, supraclavicular, and axillary nodes normal. Resp: clear to auscultation bilaterally Back: symmetric, no curvature. ROM normal. No CVA tenderness. Cardio: regular rate and rhythm, S1, S2 normal, no murmur, click, rub or gallop GI: soft, non-tender; bowel sounds normal; no masses,  no organomegaly Extremities: extremities normal, atraumatic, no cyanosis or edema Neurologic: Alert and oriented X 3, normal strength and tone. Normal symmetric reflexes. Normal coordination and gait  ECOG PERFORMANCE STATUS: 1 - Symptomatic but completely ambulatory  Blood pressure (!) 141/86, pulse 95, temperature 98.5 F (36.9 C), temperature source Temporal, resp. rate 18, height 5\' 2"  (1.575 m), weight 250 lb 9.6 oz (113.7 kg), SpO2 100 %.  LABORATORY DATA: Lab Results  Component Value Date   WBC 9.0 07/29/2019   HGB 13.0 07/29/2019   HCT 42.0 07/29/2019   MCV 91.5 07/29/2019   PLT 294 07/29/2019      Chemistry      Component Value Date/Time   NA 143 07/29/2019 1220   NA 143 11/23/2016  1504   K 4.3 07/29/2019 1220   CL 100 07/29/2019 1220   CO2 28 07/29/2019 1220   BUN 16 07/29/2019 1220   BUN 14 11/23/2016 1504   CREATININE 0.94 07/29/2019 1220      Component Value Date/Time   CALCIUM 8.7 (L) 07/29/2019 1220   ALKPHOS 102 07/29/2019 1220   AST 14 (L) 07/29/2019 1220   ALT 14 07/29/2019 1220   BILITOT <0.2 (L) 07/29/2019 1220       RADIOGRAPHIC STUDIES: CT Chest W Contrast  Result Date: 07/29/2019 CLINICAL DATA:  Left upper lobe stage IIIB squamous cell lung cancer diagnosed October 2019 status post chemoradiation therapy and consolidation immunotherapy. Restaging. EXAM: CT CHEST WITH CONTRAST TECHNIQUE: Multidetector CT imaging of the chest was performed during intravenous contrast administration. CONTRAST:  8mL OMNIPAQUE IOHEXOL 300 MG/ML  SOLN COMPARISON:  04/04/2019 chest CT. FINDINGS: Cardiovascular: Normal  heart size. Stable small amount of pericardial recess fluid between main pulmonary artery and central left airways. Three-vessel coronary atherosclerosis. Atherosclerotic nonaneurysmal thoracic aorta. Normal caliber pulmonary arteries. No central pulmonary emboli. Mediastinum/Nodes: No discrete thyroid nodules. Unremarkable esophagus. No pathologically enlarged axillary, mediastinal or hilar lymph nodes. Stable coarsely calcified nonenlarged right hilar nodes from prior granulomatous disease. Lungs/Pleura: No pneumothorax. No pleural effusion. Moderate centrilobular emphysema. Mild diffuse bronchial wall thickening. New tiny 2 mm posterior right middle lobe solid pulmonary nodule (series 8/image 86). Stable tiny calcified basilar right lower lobe subcentimeter granulomas. Stable mild cylindrical bronchiectasis the medial dependent right lung base. Stable mildly thickened patchy curvilinear bandlike consolidation in perihilar left upper lobe and left lower lobe, compatible with postradiation change. No acute consolidative airspace disease or additional new significant pulmonary nodules. Upper abdomen: No acute abnormality. Scattered punctate granulomas in the spleen and liver. Colonic diverticulosis. Musculoskeletal: No aggressive appearing focal osseous lesions. Mild thoracic spondylosis. IMPRESSION: 1. New tiny 2 mm posterior right middle lobe solid pulmonary nodule, indeterminate. Recommend follow-up chest CT in 3 months. 2. No additional potential findings of metastatic disease in the chest. 3. Stable post treatment changes in the left hemithorax with no findings of local tumor recurrence. Aortic Atherosclerosis (ICD10-I70.0) and Emphysema (ICD10-J43.9). Electronically Signed   By: Ilona Sorrel M.D.   On: 07/29/2019 15:35    ASSESSMENT AND PLAN: This is a very pleasant 64 years old white female with stage IIIB non-small cell lung cancer, squamous cell carcinoma. She underwent a course of concurrent  chemoradiation with weekly carboplatin and paclitaxel, status post 7 cycles.  She tolerated this treatment well with no concerning adverse effect except for fatigue. She underwent consolidation treatment with immunotherapy with Imfinzi status post 26 cycles. The patient tolerated that treatment fairly well with no concerning complaints. She had repeat CT scan of the chest performed recently.  I personally and independently reviewed the scans and discussed the results with the patient and her husband. Her scan showed no concerning findings for disease progression but there was a 2 mm nodule in the right middle lobe that need close observation on the upcoming imaging studies. I recommended for the patient to continue on observation with repeat CT scan of the chest in 3 months. For COPD, she will continue with her current medication and inhaler. She was advised to call immediately if she has any other concerning symptoms in the interval. The patient voices understanding of current disease status and treatment options and is in agreement with the current care plan.  All questions were answered. The patient knows to call the clinic with  any problems, questions or concerns. We can certainly see the patient much sooner if necessary.  Disclaimer: This note was dictated with voice recognition software. Similar sounding words can inadvertently be transcribed and may not be corrected upon review.

## 2019-08-01 ENCOUNTER — Telehealth: Payer: Self-pay | Admitting: Internal Medicine

## 2019-08-01 ENCOUNTER — Ambulatory Visit: Payer: BLUE CROSS/BLUE SHIELD | Admitting: Physician Assistant

## 2019-08-01 NOTE — Telephone Encounter (Signed)
Scheduled per 1/14 los. Called and spoke with pt, confirmed 4/12 and 4/14 appt

## 2019-09-26 ENCOUNTER — Encounter: Payer: Self-pay | Admitting: Internal Medicine

## 2019-10-27 ENCOUNTER — Encounter (HOSPITAL_COMMUNITY): Payer: Self-pay

## 2019-10-27 ENCOUNTER — Other Ambulatory Visit: Payer: Self-pay

## 2019-10-27 ENCOUNTER — Inpatient Hospital Stay: Payer: BLUE CROSS/BLUE SHIELD | Attending: Internal Medicine

## 2019-10-27 ENCOUNTER — Ambulatory Visit (HOSPITAL_COMMUNITY)
Admission: RE | Admit: 2019-10-27 | Discharge: 2019-10-27 | Disposition: A | Discharge status: Home / Self Care | Payer: BLUE CROSS/BLUE SHIELD | Source: Ambulatory Visit | Attending: Internal Medicine | Admitting: Internal Medicine

## 2019-10-27 DIAGNOSIS — C3490 Malignant neoplasm of unspecified part of unspecified bronchus or lung: Secondary | ICD-10-CM | POA: Insufficient documentation

## 2019-10-27 DIAGNOSIS — I509 Heart failure, unspecified: Secondary | ICD-10-CM | POA: Diagnosis not present

## 2019-10-27 DIAGNOSIS — Z79899 Other long term (current) drug therapy: Secondary | ICD-10-CM | POA: Diagnosis not present

## 2019-10-27 DIAGNOSIS — J449 Chronic obstructive pulmonary disease, unspecified: Secondary | ICD-10-CM | POA: Diagnosis not present

## 2019-10-27 DIAGNOSIS — Z5111 Encounter for antineoplastic chemotherapy: Secondary | ICD-10-CM | POA: Diagnosis not present

## 2019-10-27 DIAGNOSIS — C3432 Malignant neoplasm of lower lobe, left bronchus or lung: Secondary | ICD-10-CM | POA: Diagnosis not present

## 2019-10-27 DIAGNOSIS — Z9981 Dependence on supplemental oxygen: Secondary | ICD-10-CM | POA: Diagnosis not present

## 2019-10-27 DIAGNOSIS — E669 Obesity, unspecified: Secondary | ICD-10-CM | POA: Diagnosis not present

## 2019-10-27 LAB — CBC WITH DIFFERENTIAL (CANCER CENTER ONLY)
Abs Immature Granulocytes: 0.04 10*3/uL (ref 0.00–0.07)
Basophils Absolute: 0 10*3/uL (ref 0.0–0.1)
Basophils Relative: 0 %
Eosinophils Absolute: 0.2 10*3/uL (ref 0.0–0.5)
Eosinophils Relative: 2 %
HCT: 40.6 % (ref 36.0–46.0)
Hemoglobin: 12.1 g/dL (ref 12.0–15.0)
Immature Granulocytes: 1 %
Lymphocytes Relative: 11 %
Lymphs Abs: 0.9 10*3/uL (ref 0.7–4.0)
MCH: 27.9 pg (ref 26.0–34.0)
MCHC: 29.8 g/dL — ABNORMAL LOW (ref 30.0–36.0)
MCV: 93.8 fL (ref 80.0–100.0)
Monocytes Absolute: 0.5 10*3/uL (ref 0.1–1.0)
Monocytes Relative: 6 %
Neutro Abs: 6.6 10*3/uL (ref 1.7–7.7)
Neutrophils Relative %: 80 %
Platelet Count: 264 10*3/uL (ref 150–400)
RBC: 4.33 MIL/uL (ref 3.87–5.11)
RDW: 14.3 % (ref 11.5–15.5)
WBC Count: 8.2 10*3/uL (ref 4.0–10.5)
nRBC: 0 % (ref 0.0–0.2)

## 2019-10-27 LAB — CMP (CANCER CENTER ONLY)
ALT: 20 U/L (ref 0–44)
AST: 18 U/L (ref 15–41)
Albumin: 3.2 g/dL — ABNORMAL LOW (ref 3.5–5.0)
Alkaline Phosphatase: 86 U/L (ref 38–126)
Anion gap: 8 (ref 5–15)
BUN: 14 mg/dL (ref 8–23)
CO2: 31 mmol/L (ref 22–32)
Calcium: 9.2 mg/dL (ref 8.9–10.3)
Chloride: 102 mmol/L (ref 98–111)
Creatinine: 0.83 mg/dL (ref 0.44–1.00)
GFR, Est AFR Am: >60 mL/min
GFR, Estimated: >60 mL/min
Glucose, Bld: 110 mg/dL — ABNORMAL HIGH (ref 70–99)
Potassium: 4.4 mmol/L (ref 3.5–5.1)
Sodium: 141 mmol/L (ref 135–145)
Total Bilirubin: <0.2 mg/dL — ABNORMAL LOW (ref 0.3–1.2)
Total Protein: 7.4 g/dL (ref 6.5–8.1)

## 2019-10-27 LAB — TSH: TSH: 2.929 u[IU]/mL (ref 0.308–3.960)

## 2019-10-27 MED ORDER — SODIUM CHLORIDE (PF) 0.9 % IJ SOLN
INTRAMUSCULAR | Status: AC
  Filled 2019-10-27: qty 50

## 2019-10-27 MED ORDER — IOHEXOL 300 MG/ML  SOLN
75.0000 mL | Freq: Once | INTRAMUSCULAR | Status: AC | PRN
  Administered 2019-10-27: 75 mL via INTRAVENOUS

## 2019-10-29 ENCOUNTER — Encounter: Payer: Self-pay | Admitting: Internal Medicine

## 2019-10-29 ENCOUNTER — Inpatient Hospital Stay (HOSPITAL_BASED_OUTPATIENT_CLINIC_OR_DEPARTMENT_OTHER): Payer: BLUE CROSS/BLUE SHIELD | Admitting: Internal Medicine

## 2019-10-29 ENCOUNTER — Other Ambulatory Visit: Payer: Self-pay

## 2019-10-29 VITALS — BP 124/78 | HR 121 | Temp 98.7°F | Resp 20 | Ht 62.0 in | Wt 259.7 lb

## 2019-10-29 DIAGNOSIS — J984 Other disorders of lung: Secondary | ICD-10-CM | POA: Diagnosis not present

## 2019-10-29 DIAGNOSIS — I509 Heart failure, unspecified: Secondary | ICD-10-CM | POA: Diagnosis not present

## 2019-10-29 DIAGNOSIS — E669 Obesity, unspecified: Secondary | ICD-10-CM | POA: Diagnosis not present

## 2019-10-29 DIAGNOSIS — C3432 Malignant neoplasm of lower lobe, left bronchus or lung: Secondary | ICD-10-CM | POA: Diagnosis not present

## 2019-10-29 DIAGNOSIS — Z79899 Other long term (current) drug therapy: Secondary | ICD-10-CM | POA: Diagnosis not present

## 2019-10-29 DIAGNOSIS — J449 Chronic obstructive pulmonary disease, unspecified: Secondary | ICD-10-CM | POA: Diagnosis not present

## 2019-10-29 DIAGNOSIS — C3490 Malignant neoplasm of unspecified part of unspecified bronchus or lung: Secondary | ICD-10-CM | POA: Diagnosis not present

## 2019-10-29 DIAGNOSIS — Z9981 Dependence on supplemental oxygen: Secondary | ICD-10-CM | POA: Diagnosis not present

## 2019-10-29 NOTE — Progress Notes (Signed)
[place] [phone]   Fax:(336) [phone]  OFFICE PROGRESS NOTE  System, Pcp Not In No address on file  DIAGNOSIS: stage IIIB (T3, N2, M0) non-small cell lung cancer, squamous cell carcinoma presented with large left lower lobe lung mass in addition to mediastinal lymphadenopathy diagnosed in October 2019. PDL 1 expression is negative  PRIOR THERAPY:  1) Concurrent chemoradiation with weekly carboplatin for AUC of 2 and paclitaxel 45 mg/M2.  Status post 7 cycles with partial response. 2) Consolidation treatment with immunotherapy with Imfinzi 10 mg/KG every 2 weeks.  First dose July 31, 2018.  Status post 26 cycles.  CURRENT THERAPY: Observation.  INTERVAL HISTORY: Deborah [name] 64 y.o. female returns to the clinic today for follow-up visit accompanied by her husband.  The patient is feeling fine today with no concerning complaints except for the baseline shortness of breath and she is currently on home oxygen secondary to COPD.  She denied having any current chest pain, cough or hemoptysis.  She denied having any recent weight loss or night sweats.  She has no nausea, vomiting, diarrhea or constipation.  She has no headache or visual changes.  She has been in observation for the last 3 months.  The patient had repeat CT scan of the chest performed recently and she is here for evaluation and discussion of her risk her results.  MEDICAL HISTORY: Past Medical History:  Diagnosis Date  . Anemia    as teen  . Asthma   . CHF (congestive heart failure) ([place])   . COPD, mild ([place])   . Depression   . Diverticulitis   . Family history of anesthesia complication    vomiting  . GI bleeding   . Heart failure ([employer])    New onset 07/25/14  . Histoplasmosis    left eye  . Hyperkalemia   . Hypertension   . Obesity (BMI 30-39.9)   . Pneumonia    dx wtih pneumonia on 05/27/16- seen by [facility]   . PONV (postoperative nausea and vomiting)   . Restrictive  lung disease   . Shortness of breath    with exertion   . Sleep apnea    mask and oxygen at nite for sleep at 2L   . Tobacco abuse   . Umbilical hernia     ALLERGIES:  has No Known Allergies.  MEDICATIONS:  Current Outpatient Medications  Medication Sig Dispense Refill  . acetaminophen (TYLENOL) 500 MG tablet Take 1,500 mg by mouth 2 (two) times daily as needed for moderate pain or headache.    . albuterol (PROVENTIL) (2.5 MG/3ML) 0.083% nebulizer solution USE 1 VIAL VIA NEBULIZER EVERY 3 HOURS AS NEEDED FOR WHEEZING OR SHORTNESS OF BREATH (Patient taking differently: Take 2.5 mg by nebulization every 3 (three) hours as needed for wheezing or shortness of breath. ) 1125 mL 1  . aspirin 81 MG chewable tablet Chew 1 tablet (81 mg total) by mouth daily. 30 tablet 1  . Cholecalciferol (D3 ADULT PO) Take 1,000 mg by mouth.    . diltiazem (CARDIZEM CD) 240 MG 24 hr capsule TAKE 1 CAPSULE BY MOUTH EVERY DAY 90 capsule 2  . furosemide (LASIX) 20 MG tablet Take 2 tablets (40 mg total) by mouth daily.  0  . ipratropium-albuterol (DUONEB) 0.5-2.5 (3) MG/3ML SOLN USE 1 VIAL VIA NEBULIZER EVERY 6 HOURS AS NEEDED (Patient taking differently: Inhale 3 mLs into the lungs every 6 (six) hours as needed (shortness of  breath or wheezing). ) 360 mL 2  . montelukast (SINGULAIR) 10 MG tablet TAKE 1 TABLET(10 MG) BY MOUTH AT BEDTIME 90 tablet 1  . OXYGEN Inhale 4 L/min into the lungs continuous.     . potassium chloride SA (KLOR-CON M20) 20 MEQ tablet TAKE 1/2 TABLET BY MOUTH EVERY DAY 45 tablet 1  . prochlorperazine (COMPAZINE) 10 MG tablet TAKE 1 TABLET BY MOUTH EVERY 6 HOURS AS NEEDED FOR NAUSEA OR VOMITING. 30 tablet 0  . Spacer/Aero-Holding Chambers (AEROCHAMBER MV) inhaler Use as instructed 1 each 0  . VENTOLIN HFA 108 (90 Base) MCG/ACT inhaler INHALE 2 PUFFS BY MOUTH EVERY 6 HOURS AS NEEDED FOR WHEEZING OR SHORTNESS OF BREATH 18 g 5  . vitamin B-12 (CYANOCOBALAMIN) 500 MCG tablet Take 500 mcg by mouth  daily.     No current facility-administered medications for this visit.    SURGICAL HISTORY:  Past Surgical History:  Procedure Laterality Date  . APPLICATION OF WOUND VAC  03/19/2013   Procedure: APPLICATION OF WOUND VAC;  Surgeon: Madilyn Hook, DO;  Location: [facility];  Service: General;;  . CESAREAN SECTION  04/12/85  . COLONOSCOPY WITH PROPOFOL N/A 06/13/2016   Procedure: COLONOSCOPY WITH PROPOFOL;  Surgeon: Jerene Bears, MD;  Location: [facility];  Service: Gastroenterology;  Laterality: N/A;  . ERCP N/A 02/15/2017   Procedure: ENDOSCOPIC RETROGRADE CHOLANGIOPANCREATOGRAPHY (ERCP);  Surgeon: [name], MD;  Location: [facility];  Service: Endoscopy;  Laterality: N/A;  . INSERTION OF MESH N/A 03/19/2013   Procedure: INSERTION OF MESH;  Surgeon: Madilyn Hook, DO;  Location: [facility];  Service: General;  Laterality: N/A;  . VENTRAL HERNIA REPAIR N/A 03/19/2013   Procedure:  OPEN VENTRAL HERNIA REPAIR WITH MESH AND APPLICATION OF WOUND VAC;  Surgeon: Madilyn Hook, DO;  Location: [facility];  Service: General;  Laterality: N/A;  . VIDEO BRONCHOSCOPY Bilateral 04/19/2018   Procedure: VIDEO BRONCHOSCOPY WITH FLUORO;  Surgeon: Rigoberto Noel, MD;  Location: [facility];  Service: Cardiopulmonary;  Laterality: Bilateral;    REVIEW OF SYSTEMS:  A comprehensive review of systems was negative except for: Respiratory: positive for dyspnea on exertion   PHYSICAL EXAMINATION: General appearance: alert, cooperative and no distress Head: Normocephalic, without obvious abnormality, atraumatic Neck: no adenopathy, no JVD, supple, symmetrical, trachea midline and thyroid not enlarged, symmetric, no tenderness/mass/nodules Lymph nodes: Cervical, supraclavicular, and axillary nodes normal. Resp: clear to auscultation bilaterally Back: symmetric, no curvature. ROM normal. No CVA tenderness. Cardio: regular rate and rhythm, S1, S2 normal, no murmur, click, rub or gallop GI: soft, non-tender; bowel sounds  normal; no masses,  no organomegaly Extremities: extremities normal, atraumatic, no cyanosis or edema  ECOG PERFORMANCE STATUS: 1 - Symptomatic but completely ambulatory  Blood pressure 124/78, pulse (!) 121, temperature 98.7 F (37.1 C), temperature source Oral, resp. rate 20, height 5\' 2"  (1.575 m), weight 259 lb 11.2 oz (117.8 kg), SpO2 98 %.  LABORATORY DATA: Lab Results  Component Value Date   WBC 8.2 10/27/2019   HGB 12.1 10/27/2019   HCT 40.6 10/27/2019   MCV 93.8 10/27/2019   PLT 264 10/27/2019      Chemistry      Component Value Date/Time   NA 141 10/27/2019 1129   NA 143 11/23/2016 1504   K 4.4 10/27/2019 1129   CL 102 10/27/2019 1129   CO2 31 10/27/2019 1129   BUN 14 10/27/2019 1129   BUN 14 11/23/2016 1504   CREATININE 0.83 10/27/2019 1129  Component Value Date/Time   CALCIUM 9.2 10/27/2019 1129   ALKPHOS 86 10/27/2019 1129   AST 18 10/27/2019 1129   ALT 20 10/27/2019 1129   BILITOT <0.2 (L) 10/27/2019 1129       RADIOGRAPHIC STUDIES: CT Chest W Contrast  Result Date: 10/27/2019 CLINICAL DATA:  Restaging lung cancer. Status post immunotherapy, chemotherapy and XRT. EXAM: CT CHEST WITH CONTRAST TECHNIQUE: Multidetector CT imaging of the chest was performed during intravenous contrast administration. CONTRAST:  59mL OMNIPAQUE IOHEXOL 300 MG/ML  SOLN COMPARISON:  07/29/2019 FINDINGS: Cardiovascular: Aortic atherosclerosis. Lad, left circumflex and RCA coronary artery calcifications. Normal heart size. No pericardial effusion. Mediastinum/Nodes: 5 mm left lobe of thyroid gland nodule identified. Not clinically significant; no follow-up imaging recommended (ref: J Am Coll Radiol. 2015 Feb;12(2): 143-50). The trachea appears patent and is midline. Normal appearance of the esophagus. No enlarged mediastinal or hilar lymph nodes. Lungs/Pleura: Moderate centrilobular emphysema. Small left pleural effusion is again identified. Post treatment changes within the left  hemithorax are identified including scarring, fibrosis and volume loss. Masslike architectural distortion within the paramediastinal right upper lobe is again noted. There are no findings to suggest residual or recurrent tumor within the left lung. Bronchiectasis is identified within the posterior right lower lobe. Within the periphery of the right lower lobe, posteriorly, there is peribronchovascular ground-glass attenuation and airspace densities. Findings are concerning for chronic recurrent aspiration. This is a new finding when compared with 04/08/2018. Nodular density within this area at the posterior right costophrenic sulcus measures 1 cm and is favored to be postinflammatory. Calcified nodules in the posterior right base noted. Upper Abdomen: No acute abnormality. Musculoskeletal: Spondylosis identified within the thoracic spine. No aggressive lytic or sclerotic bone lesion. IMPRESSION: 1. Stable post treatment changes within the left hemithorax. No findings to suggest residual or recurrent tumor within the left lung. 2. Bronchiectasis and peribronchovascular ground-glass attenuation and airspace densities are noted within the posterior right base. Findings are likely postinflammatory and suggestive of recurrent aspiration. This is a new finding when compared with 04/08/2018. 3. Small left pleural effusion. 4. Multi vessel coronary artery calcifications. Aortic Atherosclerosis (ICD10-I70.0) and Emphysema (ICD10-J43.9). Electronically Signed   By: Kerby Moors M.D.   On: 10/27/2019 14:00    ASSESSMENT AND PLAN: This is a very pleasant 64 years old white female with stage IIIB non-small cell lung cancer, squamous cell carcinoma. She underwent a course of concurrent chemoradiation with weekly carboplatin and paclitaxel, status post 7 cycles.  She tolerated this treatment well with no concerning adverse effect except for fatigue. She underwent consolidation treatment with immunotherapy with Imfinzi  status post 26 cycles. The patient is currently on observation and she is feeling fine today with no concerning complaints. She had repeat CT scan of the chest performed recently.  I personally and independently reviewed the scans and discussed the results with the patient today. Her scan showed no concerning findings for disease progression. I recommended for the patient to continue on observation with repeat CT scan of the chest in 3 months. For COPD, she will continue with her current medication and inhaler. The patient was advised to call immediately if she has any other concerning symptoms in the interval. The patient voices understanding of current disease status and treatment options and is in agreement with the current care plan.  All questions were answered. The patient knows to call the clinic with any problems, questions or concerns. We can certainly see the patient much sooner if necessary.  Disclaimer:  This note was dictated with voice recognition software. Similar sounding words can inadvertently be transcribed and may not be corrected upon review.

## 2019-11-03 ENCOUNTER — Encounter: Payer: BLUE CROSS/BLUE SHIELD | Admitting: Primary Care

## 2019-11-12 ENCOUNTER — Encounter: Payer: Self-pay | Admitting: Primary Care

## 2019-11-12 ENCOUNTER — Ambulatory Visit: Payer: BLUE CROSS/BLUE SHIELD | Admitting: Primary Care

## 2019-11-12 ENCOUNTER — Other Ambulatory Visit: Payer: Self-pay

## 2019-11-12 VITALS — BP 138/76 | HR 115 | Temp 98.8°F | Resp 26 | Ht 62.0 in | Wt 258.5 lb

## 2019-11-12 DIAGNOSIS — J432 Centrilobular emphysema: Secondary | ICD-10-CM

## 2019-11-12 DIAGNOSIS — I10 Essential (primary) hypertension: Secondary | ICD-10-CM

## 2019-11-12 DIAGNOSIS — J984 Other disorders of lung: Secondary | ICD-10-CM | POA: Diagnosis not present

## 2019-11-12 DIAGNOSIS — I5032 Chronic diastolic (congestive) heart failure: Secondary | ICD-10-CM | POA: Diagnosis not present

## 2019-11-12 DIAGNOSIS — F319 Bipolar disorder, unspecified: Secondary | ICD-10-CM

## 2019-11-12 DIAGNOSIS — F32A Depression, unspecified: Secondary | ICD-10-CM

## 2019-11-12 DIAGNOSIS — I272 Pulmonary hypertension, unspecified: Secondary | ICD-10-CM | POA: Diagnosis not present

## 2019-11-12 DIAGNOSIS — C3432 Malignant neoplasm of lower lobe, left bronchus or lung: Secondary | ICD-10-CM

## 2019-11-12 DIAGNOSIS — R739 Hyperglycemia, unspecified: Secondary | ICD-10-CM

## 2019-11-12 DIAGNOSIS — F329 Major depressive disorder, single episode, unspecified: Secondary | ICD-10-CM

## 2019-11-12 DIAGNOSIS — Z23 Encounter for immunization: Secondary | ICD-10-CM

## 2019-11-12 DIAGNOSIS — R002 Palpitations: Secondary | ICD-10-CM

## 2019-11-12 LAB — POCT GLYCOSYLATED HEMOGLOBIN (HGB A1C): Hemoglobin A1C: 5.5 % (ref 4.0–5.6)

## 2019-11-12 MED ORDER — FUROSEMIDE 20 MG PO TABS: 40.0000 mg | ORAL_TABLET | Freq: Every day | ORAL | 1 refills | Status: DC

## 2019-11-12 NOTE — Assessment & Plan Note (Signed)
Following with pulmonology, recent CT chest reviewed. Recent oncology note reviewed. Referral placed to pulmonology for COPD management.

## 2019-11-12 NOTE — Addendum Note (Signed)
Addended by: Kris Mouton on: 11/12/2019 04:31 PM   Modules accepted: Orders

## 2019-11-12 NOTE — Assessment & Plan Note (Signed)
Borderline high today on furosemide and diltiazem. Will consult with cardiology regarding tachycardia and palpitations.  BP within normal range last week. Continue to monitor.

## 2019-11-12 NOTE — Assessment & Plan Note (Signed)
Infrequent use of SABA, however, she does have quite a bit of exertional dyspnea that could be multifactorial.  Referral placed to pulmonology for evaluation. May need LABA/ICS. Continue supplemental oxygen.

## 2019-11-12 NOTE — Assessment & Plan Note (Signed)
Intermittent and occurring with tachycardia during exertion. Managed on diltiazem. Will consult with cardiology for input.

## 2019-11-12 NOTE — Assessment & Plan Note (Signed)
Noted on numerous labs.  A1C today 5.5.

## 2019-11-12 NOTE — Progress Notes (Signed)
Subjective:    Patient ID: Deborah [name], female    DOB: Apr 15, 1956, 64 y.o.   MRN: [id number]  HPI  This visit occurred during the SARS-CoV-2 public health emergency.  Safety protocols were in place, including screening questions prior to the visit, additional usage of staff PPE, and extensive cleaning of exam room while observing appropriate contact time as indicated for disinfecting solutions.   Deborah [name] is a 64 year old female who presents today to transfer care from Dr. [name]. She is also due for her shingrix series.  1) CHF/Hypretension/Pulmonary Hypertension: Currently managed on diltiazem CD 240 mg, furosemide 40 mg daily, potassium chloride 20 mEq.  Following with cardiology with last visit being in December 2020. During that visit furosemide was decreased to 40 mg due to increased creatinine suggesting mild volume depletion. She is due again for follow up in June 2021. She is needing a refill of her furosemide and was deferred to our office for this.   She is concerned about tachycardia producing palpitations that occurs with mild exertion. She is checking her HR at home which runs in the 90's at rest with increases to 110's-140 with ambulation. She is concerned about this and is requesting medication to help reduce heart rate producing palpitations. She has not yet discussed with cardiology. She is compliant to her diltiazem.   BP Readings from Last 3 Encounters:  11/12/19 138/76  10/29/19 124/78  07/31/19 (!) 141/86   Wt Readings from Last 3 Encounters:  11/12/19 258 lb 8 oz (117.3 kg)  10/29/19 259 lb 11.2 oz (117.8 kg)  07/31/19 250 lb 9.6 oz (113.7 kg)     2) COPD/Chronic Respiratory Failure/Lung Cancer: Following with Oncology. Stage IIIB non small cell lung cancer, squamous cell carcinoma with left loewr lob mass and mediastinal lymphadenopathy, diagnosed in October 2019.   Has completed chemoradiation and immunotherapy. Recently completed repeat CT chest which  did not show concerning findings per notes. Recommendations from her recent visit with pulmonology were to repeat CT chest in three months and continue current COPD treatment.   She is managed on albuterol HFA inhaler for which she uses infrequently, Singulair, supplemental oxygen at 3.5-4 liters continuously. She has not seen a pulmonary doctor since prior to her lung cancer diagnosis. Would like to re-establish.   3) Bipolar Disorder: She denies this diagnosis, has never been diagnosed with anxiety or depression. She was once referred to psychiatric clinic for which she waited in the lobby for 10 minutes and left. She believes she was referred to this clinic as she once was asked if she had a history of suicidal thoughts. She answered "yes" but her thoughts were years ago.   Review of Systems  Constitutional: Negative for fever.  Respiratory: Positive for shortness of breath and wheezing.   Cardiovascular: Positive for palpitations. Negative for chest pain.  Psychiatric/Behavioral: The patient is not nervous/anxious.        Past Medical History:  Diagnosis Date  . Anemia    as teen  . Asthma   . CHF (congestive heart failure) ([place])   . COPD, mild ([place])   . Depression   . Diverticulitis   . Family history of anesthesia complication    vomiting  . GI bleeding   . Heart failure ([place])    New onset 07/25/14  . Histoplasmosis    left eye  . Hyperkalemia   . Hypertension   . Obesity (BMI 30-39.9)   . Pneumonia    dx  wtih pneumonia on 05/27/16- seen by [facility]   . PONV (postoperative nausea and vomiting)   . Restrictive lung disease   . Shortness of breath    with exertion   . Sleep apnea    mask and oxygen at nite for sleep at 2L   . Tobacco abuse   . Umbilical hernia      Social History   Socioeconomic History  . Marital status: Married    Spouse name: Not on file  . Number of children: 1  . Years of education: Not on file  . Highest education level: Not on file    Occupational History  . Occupation: Retired    Fish farm manager: NOT EMPLOYED  Tobacco Use  . Smoking status: Former Smoker    Packs/day: 3.00    Years: 43.00    Pack years: 129.00    Types: Cigarettes    Quit date: 12/17/2012    Years since quitting: 6.9  . Smokeless tobacco: Never Used  Substance and Sexual Activity  . Alcohol use: No    Alcohol/week: 0.0 standard drinks  . Drug use: No  . Sexual activity: Not on file  Other Topics Concern  . Not on file  Social History Narrative   ** Merged History Encounter **       ** Data from: 07/25/14 Enc Dept: [facility]       ** Data from: 06/05/13 Enc Dept: [facility]   Married, one son 34 yo.            Social Determinants of Health   Financial Resource Strain:   . Difficulty of Paying Living Expenses:   Food Insecurity:   . Worried About Charity fundraiser in the Last Year:   . Arboriculturist in the Last Year:   Transportation Needs:   . Film/video editor (Medical):   Marland Kitchen Lack of Transportation (Non-Medical):   Physical Activity:   . Days of Exercise per Week:   . Minutes of Exercise per Session:   Stress:   . Feeling of Stress :   Social Connections:   . Frequency of Communication with Friends and Family:   . Frequency of Social Gatherings with Friends and Family:   . Attends Religious Services:   . Active Member of Clubs or Organizations:   . Attends Archivist Meetings:   Marland Kitchen Marital Status:   Intimate Partner Violence:   . Fear of Current or Ex-Partner:   . Emotionally Abused:   Marland Kitchen Physically Abused:   . Sexually Abused:     Past Surgical History:  Procedure Laterality Date  . APPLICATION OF WOUND VAC  03/19/2013   Procedure: APPLICATION OF WOUND VAC;  Surgeon: Madilyn Hook, DO;  Location: [facility];  Service: General;;  . CESAREAN SECTION  04/12/85  . COLONOSCOPY WITH PROPOFOL N/A 06/13/2016   Procedure: COLONOSCOPY WITH PROPOFOL;  Surgeon: Jerene Bears, MD;  Location: [facility];   Service: Gastroenterology;  Laterality: N/A;  . ERCP N/A 02/15/2017   Procedure: ENDOSCOPIC RETROGRADE CHOLANGIOPANCREATOGRAPHY (ERCP);  Surgeon: [name], MD;  Location: [facility];  Service: Endoscopy;  Laterality: N/A;  . INSERTION OF MESH N/A 03/19/2013   Procedure: INSERTION OF MESH;  Surgeon: Madilyn Hook, DO;  Location: [facility];  Service: General;  Laterality: N/A;  . VENTRAL HERNIA REPAIR N/A 03/19/2013   Procedure:  OPEN VENTRAL HERNIA REPAIR WITH MESH AND APPLICATION OF WOUND VAC;  Surgeon: Madilyn Hook, DO;  Location: [facility];  Service: General;  Laterality: N/A;  . VIDEO BRONCHOSCOPY Bilateral 04/19/2018   Procedure: VIDEO BRONCHOSCOPY WITH FLUORO;  Surgeon: Rigoberto Noel, MD;  Location: [facility];  Service: Cardiopulmonary;  Laterality: Bilateral;    Family History  Problem Relation Age of Onset  . Heart attack Mother   . Emphysema Mother        was a smoker  . Congestive Heart Failure Father   . Bipolar disorder Father   . Lymphoma Maternal Uncle   . Pancreatic cancer Maternal Aunt   . Bipolar disorder Sister   . Schizophrenia Sister   . Other Sister        tumor in her neck  . Melanoma Sister   . Heart attack Maternal Uncle   . Heart attack Maternal Uncle   . Colon cancer Neg Hx   . Breast cancer Neg Hx     No Known Allergies  Current Outpatient Medications on File Prior to Visit  Medication Sig Dispense Refill  . acetaminophen (TYLENOL) 500 MG tablet Take 1,500 mg by mouth 2 (two) times daily as needed for moderate pain or headache.    Marland Kitchen aspirin 81 MG chewable tablet Chew 1 tablet (81 mg total) by mouth daily. 30 tablet 1  . Cholecalciferol (D3 ADULT PO) Take 1,000 mg by mouth.    . diltiazem (CARDIZEM CD) 240 MG 24 hr capsule TAKE 1 CAPSULE BY MOUTH EVERY DAY 90 capsule 2  . montelukast (SINGULAIR) 10 MG tablet TAKE 1 TABLET(10 MG) BY MOUTH AT BEDTIME 90 tablet 1  . OXYGEN Inhale 4 L/min into the lungs continuous.     . potassium chloride SA (KLOR-CON  M20) 20 MEQ tablet TAKE 1/2 TABLET BY MOUTH EVERY DAY 45 tablet 1  . Spacer/Aero-Holding Chambers (AEROCHAMBER MV) inhaler Use as instructed 1 each 0  . VENTOLIN HFA 108 (90 Base) MCG/ACT inhaler INHALE 2 PUFFS BY MOUTH EVERY 6 HOURS AS NEEDED FOR WHEEZING OR SHORTNESS OF BREATH 18 g 5  . vitamin B-12 (CYANOCOBALAMIN) 500 MCG tablet Take 500 mcg by mouth daily.     No current facility-administered medications on file prior to visit.    BP 138/76   Pulse (!) 115   Temp 98.8 F (37.1 C)   Resp (!) 26   Ht 5\' 2"  (1.575 m)   Wt 258 lb 8 oz (117.3 kg)   SpO2 98% Comment: on 2 liters of oxygen  BMI 47.28 kg/m    Objective:   Physical Exam  Constitutional: She appears well-nourished.  Cardiovascular: Normal rate and regular rhythm.  Respiratory: Effort normal. No respiratory distress. She has wheezes. She has rales.  Musculoskeletal:     Cervical back: Neck supple.  Skin: Skin is warm and dry.  Psychiatric: She has a normal mood and affect.           Assessment & Plan:

## 2019-11-12 NOTE — Assessment & Plan Note (Signed)
Denies known diagnosis, will remove from active problem list.

## 2019-11-12 NOTE — Assessment & Plan Note (Signed)
Following with cardiology, compliant to furosemide 40 mg daily. Will consult with cardiology given mild exertional dyspnea with palpitations.   Stable weight over the last two weeks, weight gain since January which is likely secondary to dose reduction of furosemide.

## 2019-11-12 NOTE — Assessment & Plan Note (Addendum)
BP today borderline high, also with tachycardia. Continue furosemide and diltiazem. Referral placed to pulmonology, follows with cardiology.

## 2019-11-12 NOTE — Assessment & Plan Note (Signed)
Denies concerns for anxiety and depression. Denies prior diagnosis of Bipolar disorder.

## 2019-11-12 NOTE — Patient Instructions (Addendum)
You will be contacted regarding your referral to pulmonology.  Please let us know if you have not been contacted within two weeks.   Stop by the lab prior to leaving today. I will notify you of your results once received.   Schedule a nurse visit for 2-6 months from now for your second Shingles vaccine.  Follow up with cardiology this Summer as scheduled.   It was a pleasure to see you today!

## 2019-11-19 ENCOUNTER — Telehealth: Payer: Self-pay | Admitting: Pulmonary Disease

## 2019-11-19 NOTE — Telephone Encounter (Signed)
Spoke with patient. She stated that she was patient of [facility] but she is wanting to switch to [facility]. I advised her of the office policy for switching providers, she verbalized understanding.   RA, please advise if you are ok with her switching to MR.   MR, please advise if you are ok with taking her as a new patient.   Thanks!

## 2019-11-19 NOTE — Telephone Encounter (Signed)
This is fine. First available 30 min slot (cannot be 12/04/19)

## 2019-11-19 NOTE — Telephone Encounter (Signed)
OK with me.

## 2019-11-20 NOTE — Telephone Encounter (Signed)
Spoke with patient. She is aware that MR has agreed to take her. She already has an appointment with MR on 6/30 and she would like to keep this appointment.   Advised her to call us back if she wants a sooner appt.   Nothing further needed at time of call.

## 2019-11-20 NOTE — Telephone Encounter (Signed)
Dr. Chase Caller - you do not have any openings that aren't on ILD clinic days. Can we use an ILD day except for 5/20?

## 2019-11-20 NOTE — Telephone Encounter (Signed)
Yes that is fine to use ILD day

## 2019-11-26 ENCOUNTER — Telehealth: Payer: Self-pay | Admitting: Primary Care

## 2019-11-26 DIAGNOSIS — E876 Hypokalemia: Secondary | ICD-10-CM

## 2019-11-26 MED ORDER — POTASSIUM CHLORIDE CRYS ER 20 MEQ PO TBCR: EXTENDED_RELEASE_TABLET | ORAL | 1 refills | Status: DC

## 2019-11-26 NOTE — Telephone Encounter (Signed)
Refill sent to pharmacy.   

## 2019-11-26 NOTE — Telephone Encounter (Signed)
Patient called requesting a refill  potassium chloride SA (KLOR-CON M20) 20 MEQ tablet  She stated that Dr [name] prescribed the medication for her .   [facility] [address]- [employer]

## 2019-12-01 ENCOUNTER — Institutional Professional Consult (permissible substitution): Payer: BLUE CROSS/BLUE SHIELD | Admitting: Internal Medicine

## 2019-12-16 ENCOUNTER — Encounter: Payer: Self-pay | Admitting: Primary Care

## 2019-12-16 ENCOUNTER — Other Ambulatory Visit: Payer: Self-pay

## 2019-12-16 ENCOUNTER — Ambulatory Visit (INDEPENDENT_AMBULATORY_CARE_PROVIDER_SITE_OTHER): Payer: BLUE CROSS/BLUE SHIELD | Admitting: Primary Care

## 2019-12-16 ENCOUNTER — Encounter: Payer: Self-pay | Admitting: Cardiovascular Disease

## 2019-12-16 ENCOUNTER — Ambulatory Visit (INDEPENDENT_AMBULATORY_CARE_PROVIDER_SITE_OTHER): Payer: BLUE CROSS/BLUE SHIELD | Admitting: Cardiovascular Disease

## 2019-12-16 VITALS — BP 112/68 | HR 118 | Ht 62.0 in | Wt 263.0 lb

## 2019-12-16 VITALS — BP 130/76 | HR 132 | Temp 96.5°F | Wt 261.5 lb

## 2019-12-16 DIAGNOSIS — R0602 Shortness of breath: Secondary | ICD-10-CM

## 2019-12-16 DIAGNOSIS — I10 Essential (primary) hypertension: Secondary | ICD-10-CM | POA: Diagnosis not present

## 2019-12-16 DIAGNOSIS — R21 Rash and other nonspecific skin eruption: Secondary | ICD-10-CM | POA: Diagnosis not present

## 2019-12-16 DIAGNOSIS — I5032 Chronic diastolic (congestive) heart failure: Secondary | ICD-10-CM | POA: Diagnosis not present

## 2019-12-16 MED ORDER — CICLOPIROX OLAMINE 0.77 % EX CREA: TOPICAL_CREAM | Freq: Two times a day (BID) | CUTANEOUS | 0 refills | Status: DC

## 2019-12-16 NOTE — Assessment & Plan Note (Signed)
Chronic for one year, appears to be secondary to moisture entrapment. Mild to moderate irritation noted bilaterally.   Given improvement with clotrimazole and betamethasone but without resolve, will switch to ciclopirox.  Discussed home care instructions for keeping axilla dry. She will update.

## 2019-12-16 NOTE — Progress Notes (Signed)
Subjective:    Patient ID: Deborah [name], female    DOB: [birth year], 64 y.o.   MRN: [id number]  HPI  This visit occurred during the SARS-CoV-2 public health emergency.  Safety protocols were in place, including screening questions prior to the visit, additional usage of staff PPE, and extensive cleaning of exam room while observing appropriate contact time as indicated for disinfecting solutions.   Ms. Donaway is a 64 year old female with a significant medical history including lung cancer, CHF, hypertension, chronic respiratory failure, OSA, COPD, obesity who presents today with a chief complaint of rash.  Her rash is located under the left axilla for which she noticed about one year ago. Since then she's applied neosporin without improvement. She's also been using Nystatin powder recently with some improvement but without resolve. She then started using clotrimazole with betamethasone 0.1/0.05% for the last two weeks with more improvement but without resolve.   She denies itching, but she does notice some pain/irritation.   Wt Readings from Last 3 Encounters:  12/16/19 261 lb 8 oz (118.6 kg)  11/12/19 258 lb 8 oz (117.3 kg)  10/29/19 259 lb 11.2 oz (117.8 kg)     Review of Systems  Constitutional: Negative for fever.  Skin: Positive for color change and rash.       Past Medical History:  Diagnosis Date  . Anemia    as teen  . Asthma   . CHF (congestive heart failure) ([place])   . COPD, mild ([place])   . Depression   . Diverticulitis   . Family history of anesthesia complication    vomiting  . GI bleeding   . Heart failure ([place])    New onset 07/25/14  . Histoplasmosis    left eye  . Hyperkalemia   . Hypertension   . Lung mass 04/19/2018  . Obesity (BMI 30-39.9)   . Pneumonia    dx wtih pneumonia on 05/27/16- seen by [facility]   . PONV (postoperative nausea and vomiting)   . Restrictive lung disease   . Shortness of breath    with exertion   . Sleep apnea    mask and  oxygen at nite for sleep at 2L   . Tobacco abuse   . Umbilical hernia      Social History   Socioeconomic History  . Marital status: Married    Spouse name: Not on file  . Number of children: 1  . Years of education: Not on file  . Highest education level: Not on file  Occupational History  . Occupation: Retired    Fish farm manager: NOT EMPLOYED  Tobacco Use  . Smoking status: Former Smoker    Packs/day: 3.00    Years: 43.00    Pack years: 129.00    Types: Cigarettes    Quit date: 12/17/2012    Years since quitting: 7.0  . Smokeless tobacco: Never Used  Substance and Sexual Activity  . Alcohol use: No    Alcohol/week: 0.0 standard drinks  . Drug use: No  . Sexual activity: Not on file  Other Topics Concern  . Not on file  Social History Narrative   ** Merged History Encounter **       ** Data from: 07/25/14 Enc Dept: [facility]       ** Data from: 06/05/13 Enc Dept: [facility]   Married, one son 67 yo.            Social Determinants of Health   Financial  Resource Strain:   . Difficulty of Paying Living Expenses:   Food Insecurity:   . Worried About Charity fundraiser in the Last Year:   . Arboriculturist in the Last Year:   Transportation Needs:   . Film/video editor (Medical):   Marland Kitchen Lack of Transportation (Non-Medical):   Physical Activity:   . Days of Exercise per Week:   . Minutes of Exercise per Session:   Stress:   . Feeling of Stress :   Social Connections:   . Frequency of Communication with Friends and Family:   . Frequency of Social Gatherings with Friends and Family:   . Attends Religious Services:   . Active Member of Clubs or Organizations:   . Attends Archivist Meetings:   Marland Kitchen Marital Status:   Intimate Partner Violence:   . Fear of Current or Ex-Partner:   . Emotionally Abused:   Marland Kitchen Physically Abused:   . Sexually Abused:     Past Surgical History:  Procedure Laterality Date  . APPLICATION OF WOUND VAC  03/19/2013     Procedure: APPLICATION OF WOUND VAC;  Surgeon: Madilyn Hook, DO;  Location: [facility];  Service: General;;  . CESAREAN SECTION  04/12/85  . COLONOSCOPY WITH PROPOFOL N/A 06/13/2016   Procedure: COLONOSCOPY WITH PROPOFOL;  Surgeon: Jerene Bears, MD;  Location: [facility];  Service: Gastroenterology;  Laterality: N/A;  . ERCP N/A 02/15/2017   Procedure: ENDOSCOPIC RETROGRADE CHOLANGIOPANCREATOGRAPHY (ERCP);  Surgeon: [name], MD;  Location: [facility];  Service: Endoscopy;  Laterality: N/A;  . INSERTION OF MESH N/A 03/19/2013   Procedure: INSERTION OF MESH;  Surgeon: Madilyn Hook, DO;  Location: [facility];  Service: General;  Laterality: N/A;  . VENTRAL HERNIA REPAIR N/A 03/19/2013   Procedure:  OPEN VENTRAL HERNIA REPAIR WITH MESH AND APPLICATION OF WOUND VAC;  Surgeon: Madilyn Hook, DO;  Location: [facility];  Service: General;  Laterality: N/A;  . VIDEO BRONCHOSCOPY Bilateral 04/19/2018   Procedure: VIDEO BRONCHOSCOPY WITH FLUORO;  Surgeon: Rigoberto Noel, MD;  Location: [facility];  Service: Cardiopulmonary;  Laterality: Bilateral;    Family History  Problem Relation Age of Onset  . Heart attack Mother   . Emphysema Mother        was a smoker  . Congestive Heart Failure Father   . Bipolar disorder Father   . Lymphoma Maternal Uncle   . Pancreatic cancer Maternal Aunt   . Bipolar disorder Sister   . Schizophrenia Sister   . Other Sister        tumor in her neck  . Melanoma Sister   . Heart attack Maternal Uncle   . Heart attack Maternal Uncle   . Colon cancer Neg Hx   . Breast cancer Neg Hx     No Known Allergies  Current Outpatient Medications on File Prior to Visit  Medication Sig Dispense Refill  . aspirin 81 MG chewable tablet Chew 1 tablet (81 mg total) by mouth daily. 30 tablet 1  . Cholecalciferol (D3 ADULT PO) Take 1,000 mg by mouth.    . diltiazem (CARDIZEM CD) 240 MG 24 hr capsule TAKE 1 CAPSULE BY MOUTH EVERY DAY 90 capsule 2  . furosemide (LASIX) 20 MG tablet  Take 2 tablets (40 mg total) by mouth daily. 180 tablet 1  . montelukast (SINGULAIR) 10 MG tablet TAKE 1 TABLET(10 MG) BY MOUTH AT BEDTIME 90 tablet 1  . OXYGEN Inhale 4 L/min into the lungs continuous.     Marland Kitchen  potassium chloride SA (KLOR-CON M20) 20 MEQ tablet TAKE 1/2 TABLET BY MOUTH EVERY DAY 45 tablet 1  . Spacer/Aero-Holding Chambers (AEROCHAMBER MV) inhaler Use as instructed 1 each 0  . vitamin B-12 (CYANOCOBALAMIN) 500 MCG tablet Take 500 mcg by mouth daily.     No current facility-administered medications on file prior to visit.    BP 130/76   Pulse (!) 132   Temp (!) 96.5 F (35.8 C) (Temporal)   Wt 261 lb 8 oz (118.6 kg)   SpO2 96%   BMI 47.83 kg/m    Objective:   Physical Exam  Constitutional: She appears well-nourished.  Skin: Skin is warm and dry. Rash noted. There is erythema.  Mild erythema under axillary folds bilaterally. Left worse than right. No skin breakdown or drainage.            Assessment & Plan:

## 2019-12-16 NOTE — Progress Notes (Signed)
Cardiology Office Note   Date:  12/16/2019   ID:  Tari, Lecount Dec 27, 1955, MRN [id number]  PCP:  Pleas Koch, NP  Cardiologist:   Kathlyn Sacramento, MD   Chief Complaint  Patient presents with  . office visit    Pt states dizziness when coughing. Meds verbally reviewed w/ pt.       History of Present Illness: Deborah [name] is a 64 y.o. female who presents for a follow-up visit regarding chronic diastolic heart failure and chronic right heart failure due to COPD. She has known history of previous tobacco use with COPD, hypertension, Obesity and bipolar disorder. She was hospitalized in January, 2016 for CHF.  She had moderate size left pleural effusion. Echocardiogram showed an ejection fraction of 27-51%, grade 1 diastolic dysfunction, dilated right ventricle with hypokinesis and mild pulmonary hypertension.  VQ scan was low probability for pulmonary embolism.    She quit smoking completely since then . She was diagnosed with squamous cell carcinoma of the lung , stage IIIb in October, 2019.  She had large left lower lobe mass in addition to mediastinal lymphadenopathy.  The patient was treated with radiation, chemotherapy and immunotherapy.  She has been doing reasonably well with no recent chest pain.  She continues to have significant exertional dyspnea.  She is on home oxygen at 4 L.  She continues to have sinus tachycardia.   Past Medical History:  Diagnosis Date  . Anemia    as teen  . Asthma   . CHF (congestive heart failure) ([place])   . COPD, mild ([place])   . Depression   . Diverticulitis   . Family history of anesthesia complication    vomiting  . GI bleeding   . Heart failure ([place])    New onset 07/25/14  . Histoplasmosis    left eye  . Hyperkalemia   . Hypertension   . Lung mass 04/19/2018  . Obesity (BMI 30-39.9)   . Pneumonia    dx wtih pneumonia on 05/27/16- seen by [facility]   . PONV (postoperative nausea and vomiting)   . Restrictive lung  disease   . Shortness of breath    with exertion   . Sleep apnea    mask and oxygen at nite for sleep at 2L   . Tobacco abuse   . Umbilical hernia     Past Surgical History:  Procedure Laterality Date  . APPLICATION OF WOUND VAC  03/19/2013   Procedure: APPLICATION OF WOUND VAC;  Surgeon: Madilyn Hook, DO;  [facility];  Service: General;;  . CESAREAN SECTION  04/12/85  . COLONOSCOPY WITH PROPOFOL N/A 06/13/2016   Procedure: COLONOSCOPY WITH PROPOFOL;  Surgeon: Jerene Bears, MD;  [facility];  Service: Gastroenterology;  Laterality: N/A;  . ERCP N/A 02/15/2017   Procedure: ENDOSCOPIC RETROGRADE CHOLANGIOPANCREATOGRAPHY (ERCP);  Surgeon: [name], MD;  [facility];  Service: Endoscopy;  Laterality: N/A;  . INSERTION OF MESH N/A 03/19/2013   Procedure: INSERTION OF MESH;  Surgeon: Madilyn Hook, DO;  [facility];  Service: General;  Laterality: N/A;  . VENTRAL HERNIA REPAIR N/A 03/19/2013   Procedure:  OPEN VENTRAL HERNIA REPAIR WITH MESH AND APPLICATION OF WOUND VAC;  Surgeon: Madilyn Hook, DO;  [facility];  Service: General;  Laterality: N/A;  . VIDEO BRONCHOSCOPY Bilateral 04/19/2018   Procedure: VIDEO BRONCHOSCOPY WITH FLUORO;  Surgeon: Rigoberto Noel, MD;  [facility];  Service: Cardiopulmonary;  Laterality:  Bilateral;     Current Outpatient Medications  Medication Sig Dispense Refill  . aspirin 81 MG chewable tablet Chew 1 tablet (81 mg total) by mouth daily. 30 tablet 1  . Cholecalciferol (D3 ADULT PO) Take 1,000 mg by mouth.    . ciclopirox (CICLODAN) 0.77 % cream Apply topically 2 (two) times daily. 30 g 0  . diltiazem (CARDIZEM CD) 240 MG 24 hr capsule TAKE 1 CAPSULE BY MOUTH EVERY DAY 90 capsule 2  . furosemide (LASIX) 20 MG tablet Take 2 tablets (40 mg total) by mouth daily. 180 tablet 1  . montelukast (SINGULAIR) 10 MG tablet TAKE 1 TABLET(10 MG) BY MOUTH AT BEDTIME 90 tablet 1  . OXYGEN Inhale 4 L/min into the lungs  continuous.     . potassium chloride SA (KLOR-CON M20) 20 MEQ tablet TAKE 1/2 TABLET BY MOUTH EVERY DAY 45 tablet 1  . Spacer/Aero-Holding Chambers (AEROCHAMBER MV) inhaler Use as instructed 1 each 0  . vitamin B-12 (CYANOCOBALAMIN) 500 MCG tablet Take 500 mcg by mouth daily.     No current facility-administered medications for this visit.    Allergies:   Patient has no known allergies.    Social History:  The patient  reports that she quit smoking about 7 years ago. Her smoking use included cigarettes. She has a 129.00 pack-year smoking history. She has never used smokeless tobacco. She reports that she does not drink alcohol or use drugs.   Family History:  The patient's family history includes Bipolar disorder in her father and sister; Congestive Heart Failure in her father; Emphysema in her mother; Heart attack in her maternal uncle, maternal uncle, and mother; Lymphoma in her maternal uncle; Melanoma in her sister; Other in her sister; Pancreatic cancer in her maternal aunt; Schizophrenia in her sister.    ROS:  Please see the history of present illness.   Otherwise, review of systems are positive for none.   All other systems are reviewed and negative.    PHYSICAL EXAM: VS:  BP 112/68 (BP Location: Right Arm, Patient Position: Sitting, Cuff Size: Normal)   Pulse (!) 118   Ht 5\' 2"  (1.575 m)   Wt 263 lb (119.3 kg)   SpO2 98%   BMI 48.10 kg/m  , BMI Body mass index is 48.1 kg/m. GEN: Well nourished, well developed, in no acute distress  HEENT: normal  Neck: no JVD, carotid bruits, or masses Cardiac: RRR; no murmurs, rubs, or gallops,no edema  Respiratory:  clear to auscultation bilaterally with diminished breath sounds, normal work of breathing GI: soft, nontender, nondistended, + BS MS: no deformity or atrophy  Skin: warm and dry, no rash Neuro:  Strength and sensation are intact Psych: euthymic mood, full affect   EKG:  EKG is ordered today. The ekg ordered today  demonstrates sinus tachycardia with low voltage and nonspecific T wave changes.  Recent Labs: 10/27/2019: ALT 20; BUN 14; Creatinine 0.83; Hemoglobin 12.1; Platelet Count 264; Potassium 4.4; Sodium 141; TSH 2.929    Lipid Panel    Component Value Date/Time   CHOL 200 03/29/2015 1216   TRIG 98.0 03/29/2015 1216   HDL 44.60 03/29/2015 1216   CHOLHDL 4 03/29/2015 1216   VLDL 19.6 03/29/2015 1216   LDLCALC 136 (H) 03/29/2015 1216      Wt Readings from Last 3 Encounters:  12/16/19 263 lb (119.3 kg)  12/16/19 261 lb 8 oz (118.6 kg)  11/12/19 258 lb 8 oz (117.3 kg)  ASSESSMENT AND PLAN:  1.  Chronic diastolic heart failure: No significant volume overload by exam.  She continues to be mildly tachycardic.  Recent labs were unremarkable.  She reports worsening dyspnea which could be due to underlying lung disease.  However, we have to rule out cardiomyopathy.  I requested an echocardiogram.  2. Essential hypertension: Blood pressure is well controlled on diltiazem.  3.  Stage IIIb lung cancer, status post radiation and chemotherapy.  Followed by oncology and currently getting immunotherapy.  Disposition:   FU with me in 6 months  Signed,  Kathlyn Sacramento, MD  12/16/2019 3:01 PM    [place]

## 2019-12-16 NOTE — Patient Instructions (Signed)
Medication Instructions:  Your physician recommends that you continue on your current medications as directed. Please refer to the Current Medication list given to you today.  *If you need a refill on your cardiac medications before your next appointment, please call your pharmacy*   Lab Work: None ordered If you have labs (blood work) drawn today and your tests are completely normal, you will receive your results only by: . MyChart Message (if you have MyChart) OR . A paper copy in the mail If you have any lab test that is abnormal or we need to change your treatment, we will call you to review the results.   Testing/Procedures: Your physician has requested that you have an echocardiogram. Echocardiography is a painless test that uses sound waves to create images of your heart. It provides your doctor with information about the size and shape of your heart and how well your heart's chambers and valves are working. This procedure takes approximately one hour. There are no restrictions for this procedure.     Follow-Up: At [facility], you and your health needs are our priority.  As part of our continuing mission to provide you with exceptional heart care, we have created designated Provider Care Teams.  These Care Teams include your primary Cardiologist (physician) and Advanced Practice Providers (APPs -  Physician Assistants and Nurse Practitioners) who all work together to provide you with the care you need, when you need it.  We recommend signing up for the patient portal called "MyChart".  Sign up information is provided on this After Visit Summary.  MyChart is used to connect with patients for Virtual Visits (Telemedicine).  Patients are able to view lab/test results, encounter notes, upcoming appointments, etc.  Non-urgent messages can be sent to your provider as well.   To learn more about what you can do with MyChart, go to [email or web address].com.    Your next appointment:   6  month(s)  The format for your next appointment:   In Person  Provider:    You may see Muhammad Arida, MD or one of the following Advanced Practice Providers on your designated Care Team:    Christopher Berge, NP  Ryan Dunn, PA-C  Jacquelyn Visser, PA-C    Other Instructions N/A  

## 2019-12-16 NOTE — Patient Instructions (Signed)
Apply the ciclopirox cream twice daily for about one week, then as needed.   Be sure to keep your underarm area dry with gold bond powder.   It was a pleasure to see you today!

## 2019-12-31 ENCOUNTER — Telehealth: Payer: Self-pay | Admitting: Primary Care

## 2019-12-31 DIAGNOSIS — J984 Other disorders of lung: Secondary | ICD-10-CM

## 2019-12-31 MED ORDER — MONTELUKAST SODIUM 10 MG PO TABS: 10.0000 mg | ORAL_TABLET | Freq: Every day | ORAL | 2 refills | Status: DC

## 2019-12-31 NOTE — Telephone Encounter (Signed)
Noted, refill sent to pharmacy. 

## 2019-12-31 NOTE — Telephone Encounter (Signed)
Patietn called requesting a refill on her montelukast (SINGULAIR) 10 MG tablet  She stated that the pharmacy is needing a new script from Allie Bossier  She stated that Dr [name] had originally sent this in for her    [facility] [address] [facility] [address]

## 2020-01-14 ENCOUNTER — Ambulatory Visit (INDEPENDENT_AMBULATORY_CARE_PROVIDER_SITE_OTHER): Payer: BLUE CROSS/BLUE SHIELD | Admitting: Internal Medicine

## 2020-01-14 ENCOUNTER — Encounter: Payer: Self-pay | Admitting: Internal Medicine

## 2020-01-14 ENCOUNTER — Ambulatory Visit (INDEPENDENT_AMBULATORY_CARE_PROVIDER_SITE_OTHER): Payer: BLUE CROSS/BLUE SHIELD

## 2020-01-14 ENCOUNTER — Other Ambulatory Visit: Payer: Self-pay

## 2020-01-14 VITALS — BP 124/78 | HR 111 | Temp 98.3°F | Ht 62.0 in | Wt 258.0 lb

## 2020-01-14 DIAGNOSIS — J449 Chronic obstructive pulmonary disease, unspecified: Secondary | ICD-10-CM | POA: Diagnosis not present

## 2020-01-14 DIAGNOSIS — Z23 Encounter for immunization: Secondary | ICD-10-CM

## 2020-01-14 DIAGNOSIS — J9611 Chronic respiratory failure with hypoxia: Secondary | ICD-10-CM

## 2020-01-14 DIAGNOSIS — J441 Chronic obstructive pulmonary disease with (acute) exacerbation: Secondary | ICD-10-CM

## 2020-01-14 LAB — BASIC METABOLIC PANEL
BUN: 18 mg/dL (ref 6–23)
CO2: 32 mEq/L (ref 19–32)
Calcium: 9.3 mg/dL (ref 8.4–10.5)
Chloride: 98 mEq/L (ref 96–112)
Creatinine, Ser: 0.8 mg/dL (ref 0.40–1.20)
GFR: 72.77 mL/min (ref >60.00)
Glucose, Bld: 114 mg/dL — ABNORMAL HIGH (ref 70–99)
Potassium: 3.8 mEq/L (ref 3.5–5.1)
Sodium: 139 mEq/L (ref 135–145)

## 2020-01-14 LAB — CBC
HCT: 38.8 % (ref 36.0–46.0)
Hemoglobin: 12.5 g/dL (ref 12.0–15.0)
MCHC: 32.2 g/dL (ref 30.0–36.0)
MCV: 88.6 fl (ref 78.0–100.0)
Platelets: 307 10*3/uL (ref 150.0–400.0)
RBC: 4.38 Mil/uL (ref 3.87–5.11)
RDW: 15.5 % (ref 11.5–15.5)
WBC: 9.2 10*3/uL (ref 4.0–10.5)

## 2020-01-14 LAB — HEPATIC FUNCTION PANEL
ALT: 38 U/L — ABNORMAL HIGH (ref 0–35)
AST: 26 U/L (ref 0–37)
Albumin: 4 g/dL (ref 3.5–5.2)
Alkaline Phosphatase: 81 U/L (ref 39–117)
Bilirubin, Direct: 0.1 mg/dL (ref 0.0–0.3)
Total Bilirubin: 0.2 mg/dL (ref 0.2–1.2)
Total Protein: 7.9 g/dL (ref 6.0–8.3)

## 2020-01-14 MED ORDER — PREDNISONE 10 MG PO TABS
10.0000 mg | ORAL_TABLET | Freq: Every day | ORAL | 0 refills | Status: DC
Start: 2020-01-14 — End: 2020-03-26

## 2020-01-14 MED ORDER — BREZTRI AEROSPHERE 160-9-4.8 MCG/ACT IN AERO: 2.0000 | INHALATION_SPRAY | Freq: Every day | RESPIRATORY_TRACT | 0 refills | Status: DC

## 2020-01-14 NOTE — Addendum Note (Signed)
Addended by: Satira Sark D on: 01/14/2020 10:07 AM   Modules accepted: Orders

## 2020-01-14 NOTE — Addendum Note (Signed)
Addended by: Suzzanne Cloud E on: 01/14/2020 09:55 AM   Modules accepted: Orders

## 2020-01-14 NOTE — Addendum Note (Signed)
Addended by: Suzzanne Cloud E on: 01/14/2020 09:34 AM   Modules accepted: Orders

## 2020-01-14 NOTE — Addendum Note (Signed)
Addended by: Suzzanne Cloud E on: 01/14/2020 10:05 AM   Modules accepted: Orders

## 2020-01-14 NOTE — Progress Notes (Signed)
Per orders of Allie Bossier, NP, injection of shingrix vaccine given by Randall An. Patient tolerated injection well.

## 2020-01-14 NOTE — Progress Notes (Signed)
HPI  OV sept 2019 wth Deborah [name]  64yoffor FU ofmild COPD, OSA on CPAP  She smoked up to 3 packs per day for 43 years before quitting in 2015. She is compliant with oxygen. She completed pulmonary rehab  Since her last visit in 5/29, she was switched from [place] and Spiriva to Trelegy On her last visit 9/18, dyspnea is at baseline, reported mild chronic cough and we did a routine chest x-ray which showed a left lower lobe lung mass.   Chest x-ray from 06/2016 was reviewed which appears clear  CT chest 9/23 showed a cavitary mass measuring 7.2 x 6.2 x 4.7 cm from the superior segment of the left lower lobe extending into the left hilum with a patchy infiltrate in the periphery of the left lower lobe with some adenopathy in the left hilum  We reviewed these images and discussed implications of findings    SIGNIFICANT EVENTS / STUDIES:   adm 07/2013 with hypercarbic, hypoxic resp failure due to volume overload from previously undx CHF.  12/2015 adm for hypercarbic resp failure,Improved with BiPAP  05/2013 PFTs- normal ratio, moderate restriction, FVC 54%, FEV1 56%, small airways with significant bronchodilator response, DLCO 60%  08/2014 PSG - 216 lbs -AHI 7/h, lowest desatn 82% 12/2016 PFT FEV1 at 59%, ratio 80, FVC 58%, positive bronchodilator response, DLCO 64%  OV 01/14/2020 -transfer of care from Dr. Gwenlyn Perking to Dr. Chase Caller in the pulmonary clinic.  Subjective:  Patient ID: Deborah [name], female , DOB: November 03, 1955 , age 64 y.o. , MRN: [id number] , ADDRESS: [place] [address]   01/14/2020 -   Chief Complaint  Patient presents with  . Consult    finished lung cancer tx needs new pulmologist     HPI Deborah [name] 64 y.o. -known COPD Gold stage II as of 2018 but on chronic hypoxic respiratory failure.  Arterial blood gas in 2017 with hypercapnia during an admission.  Known sleep apnea on CPAP used to be followed by Dr. Elsworth Soho.   She would need a new sleep doctor.  Obesity with diastolic heart failure followed by Dr. Midge Minium.  Also stage IIIb non-small cell lung cancer followed by Dr. [name] Peeks below] and currently in observation therapy.  Last seen for lung cancer in January 2021.  Has regular CT scans with Dr. Julien Nordmann.  She tells me that overall she is been stable but the pandemic has made her gain 30 pounds of weight given sedentary lifestyle.  She used to do pulmonary rehabilitation which made her happy but she is not sure if it helped her.  In 2019 she was on Trelegy that did not seem to help but then after the advent of lung cancer she is focused on lung cancer treatment.  She stopped Trelegy.  Currently she not on any inhaler treatment.  She is on 4 L oxygen which is chronic baseline.  Overall she feels stable.  She tells me that when she makes Dr. Visit sometimes and most often they are here wheezing.  She feels the wheezing is at baseline.  She also has cough with yellow but mostly clear sputum.  Overall this is mild.  Her biggest symptom is dyspnea on exertion relieved by rest.  She finds ADLs difficult.  COPD CAT score is 24.  Her most recent CT chest did not show any evidence of recurrence of lung cancer.  She has visited with cardiology in November 12, 2019.  Recent labs listed  below   CAT Score 01/14/2020  Total CAT Score 24   xxxxxxxxxxxxxxxxxxxx Lung cancer history stage IIIB (T3, N2, M0) non-small cell lung cancer, squamous cell carcinoma presented with large left lower lobe lung mass in addition to mediastinal lymphadenopathy diagnosed in October 2019. PDL 1 expression is negative  PRIOR THERAPY:  1) Concurrent chemoradiation with weekly carboplatin for AUC of 2 and paclitaxel 45 mg/M2.  Status post 7 cycles with partial response. 2) Consolidation treatment with immunotherapy with Imfinzi 10 mg/KG every 2 weeks.  First dose July 31, 2018.  Status post 26 cycles.  CURRENT THERAPY:  Observation   xxxxxxxxxxxxxxxxxxxxxxxxxxxxx IMPRESSION: CT chest 1. Stable post treatment changes within the left hemithorax. No findings to suggest residual or recurrent tumor within the left lung. 2. Bronchiectasis and peribronchovascular ground-glass attenuation and airspace densities are noted within the posterior right base. Findings are likely postinflammatory and suggestive of recurrent aspiration. This is a new finding when compared with 04/08/2018. 3. Small left pleural effusion. 4. Multi vessel coronary artery calcifications.  Aortic Atherosclerosis (ICD10-I70.0) and Emphysema (ICD10-J43.9).   Electronically Signed   By: Kerby Moors M.D.   On: 10/27/2019 14:00  ROS - per HPI  Results for Deborah, [name] (MRN [id number]) as of 01/14/2020 09:07  Ref. Range 01/01/2017 08:40  FEV1-Post Latest Units: L 1.50  FEV1-%Pred-Post Latest Units: % 59  FEV1-%Change-Post Latest Units: % 13  Results for Deborah, [name] (MRN [id number]) as of 01/14/2020 09:07  Ref. Range 01/01/2017 08:40  DLCO cor Latest Units: ml/min/mmHg 14.98  DLCO cor % pred Latest Units: % 65  Results for Deborah, [name] (MRN [id number]) as of 01/14/2020 09:07  Ref. Range 07/01/2019 11:03 07/15/2019 10:08 07/29/2019 12:19 07/29/2019 12:20 10/27/2019 11:29 10/27/2019 11:30 11/12/2019 15:37 11/12/2019 15:37  CO2 Latest Ref Range: 22 - 32 mmol/L  28  28 31       Results for Deborah, [name] (MRN [id number]) as of 01/14/2020 09:07  Ref. Range 12/18/2015 10:40  pH, Arterial Latest Ref Range: 7.35 - 7.45  7.279 (L)  pCO2 arterial Latest Ref Range: 35 - 45 mmHg 77.8 (HH)  pO2, Arterial Latest Ref Range: 80 - 100 mmHg 75.5 (L)  Results for Deborah, [name] (MRN [id number]) as of 01/14/2020 09:07  Ref. Range 10/27/2019 11:29  Hemoglobin Latest Ref Range: 12.0 - 15.0 g/dL 12.1  Results for Deborah, [name] (MRN [id number]) as of 01/14/2020 09:07  Ref. Range 10/27/2019 11:29  Creatinine Latest Ref Range: 0.44 - 1.00 mg/dL  0.83    has a past medical history of Anemia, Asthma, CHF (congestive heart failure) ([place]), COPD, mild ([place]), Depression, Diverticulitis, Family history of anesthesia complication, GI bleeding, Heart failure ([place]), Histoplasmosis, Hyperkalemia, Hypertension, Lung mass (04/19/2018), Obesity (BMI 30-39.9), Pneumonia, PONV (postoperative nausea and vomiting), Restrictive lung disease, Shortness of breath, Sleep apnea, Tobacco abuse, and Umbilical hernia.   reports that she quit smoking about 7 years ago. Her smoking use included cigarettes. She has a 129.00 pack-year smoking history. She has never used smokeless tobacco.  Past Surgical History:  Procedure Laterality Date  . APPLICATION OF WOUND VAC  03/19/2013   Procedure: APPLICATION OF WOUND VAC;  Surgeon: Madilyn Hook, DO;  [facility];  Service: General;;  . CESAREAN SECTION  04/12/85  . COLONOSCOPY WITH PROPOFOL N/A 06/13/2016   Procedure: COLONOSCOPY WITH PROPOFOL;  Surgeon: Jerene Bears, MD;  [facility];  Service: Gastroenterology;  Laterality: N/A;  . ERCP N/A 02/15/2017   Procedure: ENDOSCOPIC RETROGRADE CHOLANGIOPANCREATOGRAPHY (ERCP);  Surgeon: [name], MD;  [facility];  Service: Endoscopy;  Laterality: N/A;  . INSERTION OF MESH N/A 03/19/2013   Procedure: INSERTION OF MESH;  Surgeon: Madilyn Hook, DO;  [facility];  Service: General;  Laterality: N/A;  . VENTRAL HERNIA REPAIR N/A 03/19/2013   Procedure:  OPEN VENTRAL HERNIA REPAIR WITH MESH AND APPLICATION OF WOUND VAC;  Surgeon: Madilyn Hook, DO;  [facility];  Service: General;  Laterality: N/A;  . VIDEO BRONCHOSCOPY Bilateral 04/19/2018   Procedure: VIDEO BRONCHOSCOPY WITH FLUORO;  Surgeon: Rigoberto Noel, MD;  [facility];  Service: Cardiopulmonary;  Laterality: Bilateral;    No Known Allergies  Immunization History  Administered Date(s) Administered  . Influenza,inj,Quad PF,6+ Mos 04/09/2013, 07/27/2014, 04/18/2016, 05/28/2017,  04/03/2018, 04/08/2019  . PFIZER SARS-COV-2 Vaccination 09/28/2019, 10/18/2019  . Pneumococcal Conjugate-13 01/01/2017  . Pneumococcal Polysaccharide-23 07/27/2014  . Tdap 11/12/2017  . Zoster Recombinat (Shingrix) 11/12/2019    Family History  Problem Relation Age of Onset  . Heart attack Mother   . Emphysema Mother        was a smoker  . Congestive Heart Failure Father   . Bipolar disorder Father   . Lymphoma Maternal Uncle   . Pancreatic cancer Maternal Aunt   . Bipolar disorder Sister   . Schizophrenia Sister   . Other Sister        tumor in her neck  . Melanoma Sister   . Heart attack Maternal Uncle   . Heart attack Maternal Uncle   . Colon cancer Neg Hx   . Breast cancer Neg Hx      Current Outpatient Medications:  .  aspirin 81 MG chewable tablet, Chew 1 tablet (81 mg total) by mouth daily., Disp: 30 tablet, Rfl: 1 .  Cholecalciferol (D3 ADULT PO), Take 1,000 mg by mouth., Disp: , Rfl:  .  ciclopirox (CICLODAN) 0.77 % cream, Apply topically 2 (two) times daily., Disp: 30 g, Rfl: 0 .  diltiazem (CARDIZEM CD) 240 MG 24 hr capsule, TAKE 1 CAPSULE BY MOUTH EVERY DAY, Disp: 90 capsule, Rfl: 2 .  furosemide (LASIX) 20 MG tablet, Take 2 tablets (40 mg total) by mouth daily., Disp: 180 tablet, Rfl: 1 .  montelukast (SINGULAIR) 10 MG tablet, Take 1 tablet (10 mg total) by mouth at bedtime., Disp: 90 tablet, Rfl: 2 .  OXYGEN, Inhale 4 L/min into the lungs continuous. , Disp: , Rfl:  .  potassium chloride SA (KLOR-CON M20) 20 MEQ tablet, TAKE 1/2 TABLET BY MOUTH EVERY DAY, Disp: 45 tablet, Rfl: 1 .  Spacer/Aero-Holding Chambers (AEROCHAMBER MV) inhaler, Use as instructed, Disp: 1 each, Rfl: 0 .  vitamin B-12 (CYANOCOBALAMIN) 500 MCG tablet, Take 500 mcg by mouth daily., Disp: , Rfl:       Objective:   Vitals:   01/14/20 0904  BP: 124/78  Pulse: (!) 111  Temp: 98.3 F (36.8 C)  TempSrc: Oral  SpO2: 95%  Weight: 258 lb (117 kg)  Height: 5\' 2"  (1.575 m)    Estimated  body mass index is 47.19 kg/m as calculated from the following:   Height as of this encounter: 5\' 2"  (1.575 m).   Weight as of this encounter: 258 lb (117 kg).  @WEIGHTCHANGE @  [employer]   01/14/20 0904  Weight: 258 lb (117 kg)     Physical Exam Morbidly obese female.  Mallampati class III.  Sitting on a wheelchair with oxygen on.  Other than chronic weight related edema no  obvious edema.  No cyanosis no clubbing.  No elevated neck nodes or JVP.  Bilateral diffuse wheezing present.  Normal heart sounds abdomen is obese and soft.        Assessment:       ICD-10-CM   1. COPD with acute exacerbation ([place])  J44.1   2. Chronic obstructive pulmonary disease, unspecified COPD type ([place])  J44.9   3. Chronic respiratory failure with hypoxia ([facility])  J96.11        Plan:     Patient Instructions     ICD-10-CM   1. COPD with acute exacerbation ([place])  J44.1   2. Chronic obstructive pulmonary disease, unspecified COPD type ([place])  J44.9   3. Chronic respiratory failure with hypoxia ([facility])  J96.11     Take prednisone 40 mg daily x 2 days, then 20mg  daily x 2 days, then 10mg  daily x 2 days, then 5mg  daily x 2 days and stop  Start  breztri 2 puff twice daily -take 8 weeks sample  Start albuterol as needed 2 puff  Check alpha 1 phenotype, CBC, chemistry, liver function -do this today  Check arterial blood gas in the next few weeks anytime  Do spirometry pre and postbronchodilator and DLCO -try to get this done in the next few to several weeks  Referral pulmonary rehabilitation -at [place] [facility]  Follow-up -In the next 4 weeks but after completing above with nurse practitioner -face-to-face visit  -If there is a delay getting pulmonary function test that is fine but it is important you do the visit less than 4 weeks  - in future establish with one of our other sleep docs      SIGNATURE    Dr. Brand Males, M.D., F.C.C.P,  Pulmonary and Critical Care  Medicine Staff Physician, [place] Director - Interstitial Lung Disease  Program  Pulmonary [place] at [place], [place], [address]  Pager: [phone], If no answer or between  15:00h - 7:00h: call [phone] Telephone: [phone]  9:32 AM 01/14/2020

## 2020-01-14 NOTE — Addendum Note (Signed)
Addended by: Satira Sark D on: 01/14/2020 10:09 AM   Modules accepted: Orders

## 2020-01-14 NOTE — Patient Instructions (Addendum)
ICD-10-CM   1. COPD with acute exacerbation ([place])  J44.1   2. Chronic obstructive pulmonary disease, unspecified COPD type ([place])  J44.9   3. Chronic respiratory failure with hypoxia (H[facility])  J96.11     Take prednisone 40 mg daily x 2 days, then 20mg  daily x 2 days, then 10mg  daily x 2 days, then 5mg  daily x 2 days and stop  Start  breztri 2 puff twice daily -take 8 weeks sample  Start albuterol as needed 2 puff  Check alpha 1 phenotype, CBC, chemistry, liver function -do this today  Check arterial blood gas in the next few weeks anytime  Do spirometry pre and postbronchodilator and DLCO -try to get this done in the next few to several weeks  Referral pulmonary rehabilitation -at [place] [facility]  Follow-up -In the next 4 weeks but after completing above with nurse practitioner -face-to-face visit  -If there is a delay getting pulmonary function test that is fine but it is important you do the visit less than 4 weeks  - in future establish with one of our other sleep docs

## 2020-01-14 NOTE — Addendum Note (Signed)
Addended by: Suzzanne Cloud E on: 01/14/2020 10:08 AM   Modules accepted: Orders

## 2020-01-15 ENCOUNTER — Telehealth: Payer: Self-pay | Admitting: Internal Medicine

## 2020-01-15 NOTE — Telephone Encounter (Signed)
Patient seen 01/14/2020. She states she was suppose to get Albuterol called in. Reviewed orders from visit and there is not an order for Albuterol. Please advise

## 2020-01-16 MED ORDER — ALBUTEROL SULFATE HFA 108 (90 BASE) MCG/ACT IN AERS
2.0000 | INHALATION_SPRAY | Freq: Four times a day (QID) | RESPIRATORY_TRACT | 3 refills | Status: DC | PRN
Start: 2020-01-16 — End: 2023-07-28

## 2020-01-16 NOTE — Telephone Encounter (Signed)
Yes my instructions was to start albuterol.  Therefore please send albuterol 2 puffs as needed every 6 hours as needed.  1 vial with 3 refills.

## 2020-01-16 NOTE — Telephone Encounter (Signed)
Rx for albuterol inhaler has been sent to preferred pharmacy for pt. Called and spoke with pt letting her know this had been done and she verbalized understanding. Nothing further needed.

## 2020-01-22 LAB — ALPHA-1 ANTITRYPSIN PHENOTYPE: A-1 Antitrypsin, Ser: 180 mg/dL (ref 83–199)

## 2020-01-26 ENCOUNTER — Inpatient Hospital Stay: Payer: BLUE CROSS/BLUE SHIELD | Attending: Internal Medicine

## 2020-01-26 ENCOUNTER — Ambulatory Visit (HOSPITAL_COMMUNITY)
Admission: RE | Admit: 2020-01-26 | Discharge: 2020-01-26 | Disposition: A | Discharge status: Home / Self Care | Payer: BLUE CROSS/BLUE SHIELD | Source: Ambulatory Visit | Attending: Internal Medicine | Admitting: Internal Medicine

## 2020-01-26 ENCOUNTER — Other Ambulatory Visit: Payer: Self-pay

## 2020-01-26 ENCOUNTER — Telehealth: Payer: Self-pay | Admitting: Medical Oncology

## 2020-01-26 DIAGNOSIS — I509 Heart failure, unspecified: Secondary | ICD-10-CM | POA: Diagnosis not present

## 2020-01-26 DIAGNOSIS — Z87891 Personal history of nicotine dependence: Secondary | ICD-10-CM | POA: Insufficient documentation

## 2020-01-26 DIAGNOSIS — C3490 Malignant neoplasm of unspecified part of unspecified bronchus or lung: Secondary | ICD-10-CM

## 2020-01-26 DIAGNOSIS — Z9221 Personal history of antineoplastic chemotherapy: Secondary | ICD-10-CM | POA: Diagnosis not present

## 2020-01-26 DIAGNOSIS — Z923 Personal history of irradiation: Secondary | ICD-10-CM | POA: Diagnosis not present

## 2020-01-26 DIAGNOSIS — Z79899 Other long term (current) drug therapy: Secondary | ICD-10-CM | POA: Insufficient documentation

## 2020-01-26 DIAGNOSIS — I2510 Atherosclerotic heart disease of native coronary artery without angina pectoris: Secondary | ICD-10-CM | POA: Diagnosis not present

## 2020-01-26 DIAGNOSIS — Z7982 Long term (current) use of aspirin: Secondary | ICD-10-CM | POA: Insufficient documentation

## 2020-01-26 DIAGNOSIS — C3432 Malignant neoplasm of lower lobe, left bronchus or lung: Secondary | ICD-10-CM | POA: Insufficient documentation

## 2020-01-26 DIAGNOSIS — I110 Hypertensive heart disease with heart failure: Secondary | ICD-10-CM | POA: Diagnosis not present

## 2020-01-26 DIAGNOSIS — Z7952 Long term (current) use of systemic steroids: Secondary | ICD-10-CM | POA: Diagnosis not present

## 2020-01-26 DIAGNOSIS — G4730 Sleep apnea, unspecified: Secondary | ICD-10-CM | POA: Insufficient documentation

## 2020-01-26 DIAGNOSIS — J449 Chronic obstructive pulmonary disease, unspecified: Secondary | ICD-10-CM | POA: Insufficient documentation

## 2020-01-26 DIAGNOSIS — C3492 Malignant neoplasm of unspecified part of left bronchus or lung: Secondary | ICD-10-CM | POA: Diagnosis not present

## 2020-01-26 DIAGNOSIS — E669 Obesity, unspecified: Secondary | ICD-10-CM | POA: Diagnosis not present

## 2020-01-26 LAB — CBC WITH DIFFERENTIAL (CANCER CENTER ONLY)
Abs Immature Granulocytes: 0.08 10*3/uL — ABNORMAL HIGH (ref 0.00–0.07)
Basophils Absolute: 0 10*3/uL (ref 0.0–0.1)
Basophils Relative: 0 %
Eosinophils Absolute: 0.1 10*3/uL (ref 0.0–0.5)
Eosinophils Relative: 1 %
HCT: 41.7 % (ref 36.0–46.0)
Hemoglobin: 12.6 g/dL (ref 12.0–15.0)
Immature Granulocytes: 1 %
Lymphocytes Relative: 13 %
Lymphs Abs: 1.3 10*3/uL (ref 0.7–4.0)
MCH: 27.8 pg (ref 26.0–34.0)
MCHC: 30.2 g/dL (ref 30.0–36.0)
MCV: 92.1 fL (ref 80.0–100.0)
Monocytes Absolute: 0.6 10*3/uL (ref 0.1–1.0)
Monocytes Relative: 6 %
Neutro Abs: 7.8 10*3/uL — ABNORMAL HIGH (ref 1.7–7.7)
Neutrophils Relative %: 79 %
Platelet Count: 280 10*3/uL (ref 150–400)
RBC: 4.53 MIL/uL (ref 3.87–5.11)
RDW: 14.9 % (ref 11.5–15.5)
WBC Count: 10 10*3/uL (ref 4.0–10.5)
nRBC: 0 % (ref 0.0–0.2)

## 2020-01-26 LAB — CMP (CANCER CENTER ONLY)
ALT: 39 U/L (ref 0–44)
AST: 22 U/L (ref 15–41)
Albumin: 3.3 g/dL — ABNORMAL LOW (ref 3.5–5.0)
Alkaline Phosphatase: 88 U/L (ref 38–126)
Anion gap: 11 (ref 5–15)
BUN: 15 mg/dL (ref 8–23)
CO2: 27 mmol/L (ref 22–32)
Calcium: 9.2 mg/dL (ref 8.9–10.3)
Chloride: 105 mmol/L (ref 98–111)
Creatinine: 0.77 mg/dL (ref 0.44–1.00)
GFR, Est AFR Am: >60 mL/min (ref >60)
GFR, Estimated: >60 mL/min (ref >60)
Glucose, Bld: 99 mg/dL (ref 70–99)
Potassium: 3.9 mmol/L (ref 3.5–5.1)
Sodium: 143 mmol/L (ref 135–145)
Total Bilirubin: <0.2 mg/dL — ABNORMAL LOW (ref 0.3–1.2)
Total Protein: 7.6 g/dL (ref 6.5–8.1)

## 2020-01-26 MED ORDER — SODIUM CHLORIDE (PF) 0.9 % IJ SOLN
INTRAMUSCULAR | Status: AC
  Filled 2020-01-26: qty 50

## 2020-01-26 MED ORDER — IOHEXOL 300 MG/ML  SOLN
75.0000 mL | Freq: Once | INTRAMUSCULAR | Status: AC | PRN
  Administered 2020-01-26: 75 mL via INTRAVENOUS

## 2020-01-26 NOTE — Telephone Encounter (Signed)
Called report- see impression esp #3. "IMPRESSION: 1. Similar appearance of left-sided treatment change, without findings of recurrent or metastatic disease. 2. Resolved left pleural effusion with mild left pleural thickening remaining. 3. Suspicion of an enlarging left breast nodule. Consider correlation with diagnostic mammogram and ultrasound. These results will be called to the ordering clinician or representative by the Radiologist Assistant, and communication documented in the PACS or [employer]. 4. Aortic Atherosclerosis (ICD10-I70.0) and Emphysema (ICD10-J43.9). 5. Age advanced coronary artery atherosclerosis. Recommend assessment of coronary risk factors and consideration of medical therapy. 6. Hepatic steatosis."  F/u 7/14

## 2020-01-27 ENCOUNTER — Ambulatory Visit (INDEPENDENT_AMBULATORY_CARE_PROVIDER_SITE_OTHER): Payer: BLUE CROSS/BLUE SHIELD

## 2020-01-27 DIAGNOSIS — R0602 Shortness of breath: Secondary | ICD-10-CM

## 2020-01-27 DIAGNOSIS — I5032 Chronic diastolic (congestive) heart failure: Secondary | ICD-10-CM

## 2020-01-27 MED ORDER — PERFLUTREN LIPID MICROSPHERE
1.0000 mL | INTRAVENOUS | Status: AC | PRN
  Administered 2020-01-27: 2 mL via INTRAVENOUS

## 2020-01-28 ENCOUNTER — Other Ambulatory Visit: Payer: Self-pay

## 2020-01-28 ENCOUNTER — Encounter: Payer: Self-pay | Admitting: Internal Medicine

## 2020-01-28 ENCOUNTER — Inpatient Hospital Stay (HOSPITAL_BASED_OUTPATIENT_CLINIC_OR_DEPARTMENT_OTHER): Payer: BLUE CROSS/BLUE SHIELD | Admitting: Internal Medicine

## 2020-01-28 VITALS — BP 101/60 | HR 100 | Temp 97.7°F | Resp 17 | Ht 62.0 in | Wt 255.0 lb

## 2020-01-28 DIAGNOSIS — N6320 Unspecified lump in the left breast, unspecified quadrant: Secondary | ICD-10-CM | POA: Diagnosis not present

## 2020-01-28 DIAGNOSIS — C3490 Malignant neoplasm of unspecified part of unspecified bronchus or lung: Secondary | ICD-10-CM | POA: Diagnosis not present

## 2020-01-28 DIAGNOSIS — C3432 Malignant neoplasm of lower lobe, left bronchus or lung: Secondary | ICD-10-CM | POA: Diagnosis not present

## 2020-01-28 DIAGNOSIS — Z923 Personal history of irradiation: Secondary | ICD-10-CM | POA: Diagnosis not present

## 2020-01-28 DIAGNOSIS — Z79899 Other long term (current) drug therapy: Secondary | ICD-10-CM | POA: Diagnosis not present

## 2020-01-28 DIAGNOSIS — I2510 Atherosclerotic heart disease of native coronary artery without angina pectoris: Secondary | ICD-10-CM | POA: Diagnosis not present

## 2020-01-28 DIAGNOSIS — G4730 Sleep apnea, unspecified: Secondary | ICD-10-CM | POA: Diagnosis not present

## 2020-01-28 DIAGNOSIS — J449 Chronic obstructive pulmonary disease, unspecified: Secondary | ICD-10-CM | POA: Diagnosis not present

## 2020-01-28 DIAGNOSIS — Z9221 Personal history of antineoplastic chemotherapy: Secondary | ICD-10-CM | POA: Diagnosis not present

## 2020-01-28 DIAGNOSIS — E669 Obesity, unspecified: Secondary | ICD-10-CM | POA: Diagnosis not present

## 2020-01-28 DIAGNOSIS — I509 Heart failure, unspecified: Secondary | ICD-10-CM | POA: Diagnosis not present

## 2020-01-28 DIAGNOSIS — Z87891 Personal history of nicotine dependence: Secondary | ICD-10-CM | POA: Diagnosis not present

## 2020-01-28 DIAGNOSIS — Z7952 Long term (current) use of systemic steroids: Secondary | ICD-10-CM | POA: Diagnosis not present

## 2020-01-28 DIAGNOSIS — I110 Hypertensive heart disease with heart failure: Secondary | ICD-10-CM | POA: Diagnosis not present

## 2020-01-28 DIAGNOSIS — Z7982 Long term (current) use of aspirin: Secondary | ICD-10-CM | POA: Diagnosis not present

## 2020-01-28 NOTE — Progress Notes (Signed)
[place] Telephone:(336) [phone]   Fax:(336) [phone]  OFFICE PROGRESS NOTE  Pleas Koch, NP [place] [address]  DIAGNOSIS: stage IIIB (T3, N2, M0) non-small cell lung cancer, squamous cell carcinoma presented with large left lower lobe lung mass in addition to mediastinal lymphadenopathy diagnosed in October 2019. PDL 1 expression is negative  PRIOR THERAPY:  1) Concurrent chemoradiation with weekly carboplatin for AUC of 2 and paclitaxel 45 mg/M2.  Status post 7 cycles with partial response. 2) Consolidation treatment with immunotherapy with Imfinzi 10 mg/KG every 2 weeks.  First dose July 31, 2018.  Status post 26 cycles.  CURRENT THERAPY: Observation.  INTERVAL HISTORY: Deborah [name] 64 y.o. female returns to the clinic today for follow-up visit accompanied by her husband.  The patient is feeling fine today with no concerning complaints except for the shortness of breath at baseline increased with exertion and she is currently on home oxygen.  She denied having any significant chest pain, cough or hemoptysis.  She has no nausea, vomiting, diarrhea or constipation.  She has no headache or visual changes.  She lost few pounds since her last visit.  The patient had repeat CT scan of the chest performed recently and she is here for evaluation and discussion of her risk her results.  MEDICAL HISTORY: Past Medical History:  Diagnosis Date  . Anemia    as teen  . Asthma   . CHF (congestive heart failure) ([place])   . COPD, mild ([place])   . Depression   . Diverticulitis   . Family history of anesthesia complication    vomiting  . GI bleeding   . Heart failure ([place])    New onset 07/25/14  . Histoplasmosis    left eye  . Hyperkalemia   . Hypertension   . Lung mass 04/19/2018  . Obesity (BMI 30-39.9)   . Pneumonia    dx wtih pneumonia on 05/27/16- seen by [facility]   . PONV (postoperative nausea and vomiting)   . Restrictive lung  disease   . Shortness of breath    with exertion   . Sleep apnea    mask and oxygen at nite for sleep at 2L   . Tobacco abuse   . Umbilical hernia     ALLERGIES:  has No Known Allergies.  MEDICATIONS:  Current Outpatient Medications  Medication Sig Dispense Refill  . albuterol (VENTOLIN HFA) 108 (90 Base) MCG/ACT inhaler Inhale 2 puffs into the lungs every 6 (six) hours as needed for wheezing or shortness of breath. 8 g 3  . aspirin 81 MG chewable tablet Chew 1 tablet (81 mg total) by mouth daily. 30 tablet 1  . Budeson-Glycopyrrol-Formoterol (BREZTRI AEROSPHERE) 160-9-4.8 MCG/ACT AERO Inhale 2 puffs into the lungs daily. 5.9 g 0  . Cholecalciferol (D3 ADULT PO) Take 1,000 mg by mouth.    . ciclopirox (CICLODAN) 0.77 % cream Apply topically 2 (two) times daily. 30 g 0  . diltiazem (CARDIZEM CD) 240 MG 24 hr capsule TAKE 1 CAPSULE BY MOUTH EVERY DAY 90 capsule 2  . furosemide (LASIX) 20 MG tablet Take 2 tablets (40 mg total) by mouth daily. 180 tablet 1  . montelukast (SINGULAIR) 10 MG tablet Take 1 tablet (10 mg total) by mouth at bedtime. 90 tablet 2  . OXYGEN Inhale 4 L/min into the lungs continuous.     . potassium chloride SA (KLOR-CON M20) 20 MEQ tablet TAKE 1/2 TABLET  BY MOUTH EVERY DAY 45 tablet 1  . predniSONE (DELTASONE) 10 MG tablet Take 1 tablet (10 mg total) by mouth daily with breakfast. 20 tablet 0  . Spacer/Aero-Holding Chambers (AEROCHAMBER MV) inhaler Use as instructed 1 each 0  . vitamin B-12 (CYANOCOBALAMIN) 500 MCG tablet Take 500 mcg by mouth daily.     No current facility-administered medications for this visit.    SURGICAL HISTORY:  Past Surgical History:  Procedure Laterality Date  . APPLICATION OF WOUND VAC  03/19/2013   Procedure: APPLICATION OF WOUND VAC;  Surgeon: Madilyn Hook, DO;  Location: [facility];  Service: General;;  . CESAREAN SECTION  04/12/85  . COLONOSCOPY WITH PROPOFOL N/A 06/13/2016   Procedure: COLONOSCOPY WITH PROPOFOL;  Surgeon: Jerene Bears, MD;  Location: [facility];  Service: Gastroenterology;  Laterality: N/A;  . ERCP N/A 02/15/2017   Procedure: ENDOSCOPIC RETROGRADE CHOLANGIOPANCREATOGRAPHY (ERCP);  Surgeon: [name], MD;  Location: [facility];  Service: Endoscopy;  Laterality: N/A;  . INSERTION OF MESH N/A 03/19/2013   Procedure: INSERTION OF MESH;  Surgeon: Madilyn Hook, DO;  Location: [facility];  Service: General;  Laterality: N/A;  . VENTRAL HERNIA REPAIR N/A 03/19/2013   Procedure:  OPEN VENTRAL HERNIA REPAIR WITH MESH AND APPLICATION OF WOUND VAC;  Surgeon: Madilyn Hook, DO;  Location: [facility];  Service: General;  Laterality: N/A;  . VIDEO BRONCHOSCOPY Bilateral 04/19/2018   Procedure: VIDEO BRONCHOSCOPY WITH FLUORO;  Surgeon: Rigoberto Noel, MD;  Location: [facility];  Service: Cardiopulmonary;  Laterality: Bilateral;    REVIEW OF SYSTEMS:  A comprehensive review of systems was negative except for: Respiratory: positive for dyspnea on exertion   PHYSICAL EXAMINATION: General appearance: alert, cooperative and no distress Head: Normocephalic, without obvious abnormality, atraumatic Neck: no adenopathy, no JVD, supple, symmetrical, trachea midline and thyroid not enlarged, symmetric, no tenderness/mass/nodules Lymph nodes: Cervical, supraclavicular, and axillary nodes normal. Resp: clear to auscultation bilaterally Back: symmetric, no curvature. ROM normal. No CVA tenderness. Cardio: regular rate and rhythm, S1, S2 normal, no murmur, click, rub or gallop GI: soft, non-tender; bowel sounds normal; no masses,  no organomegaly Extremities: extremities normal, atraumatic, no cyanosis or edema  ECOG PERFORMANCE STATUS: 1 - Symptomatic but completely ambulatory  Blood pressure 101/60, pulse 100, temperature 97.7 F (36.5 C), temperature source Temporal, resp. rate 17, height 5\' 2"  (1.575 m), weight 255 lb (115.7 kg), SpO2 95 %.  LABORATORY DATA: Lab Results  Component Value Date   WBC 10.0 01/26/2020   HGB  12.6 01/26/2020   HCT 41.7 01/26/2020   MCV 92.1 01/26/2020   PLT 280 01/26/2020      Chemistry      Component Value Date/Time   NA 143 01/26/2020 1019   NA 143 11/23/2016 1504   K 3.9 01/26/2020 1019   CL 105 01/26/2020 1019   CO2 27 01/26/2020 1019   BUN 15 01/26/2020 1019   BUN 14 11/23/2016 1504   CREATININE 0.77 01/26/2020 1019      Component Value Date/Time   CALCIUM 9.2 01/26/2020 1019   ALKPHOS 88 01/26/2020 1019   AST 22 01/26/2020 1019   ALT 39 01/26/2020 1019   BILITOT <0.2 (L) 01/26/2020 1019       RADIOGRAPHIC STUDIES: CT Chest W Contrast  Result Date: 01/26/2020 CLINICAL DATA:  Left-sided lung cancer diagnosed 9/19. Chemotherapy, radiation therapy and immunotherapy complete. Hypertension. COPD. Ex-smoker. EXAM: CT CHEST WITH CONTRAST TECHNIQUE: Multidetector CT imaging of the chest was performed during intravenous contrast  administration. CONTRAST:  60mL OMNIPAQUE IOHEXOL 300 MG/ML  SOLN COMPARISON:  10/27/2019 FINDINGS: Cardiovascular: Advanced aortic and branch vessel atherosclerosis. Tortuous thoracic aorta. Mild cardiomegaly with lipomatous hypertrophy of the interatrial septum. No pericardial effusion. Multivessel coronary artery atherosclerosis. No central pulmonary embolism, on this non-dedicated study. Mediastinum/Nodes: No supraclavicular adenopathy. Calcified right hilar nodes are likely related to old granulomatous disease. No mediastinal or hilar adenopathy. Lungs/Pleura: Resolved left pleural effusion with mild left pleural thickening remaining. Moderate centrilobular emphysema. Improved right lower lobe aeration, with only mild residual interstitial thickening remaining. Relatively similar appearance of areas of architectural distortion, ground-glass, and mild airspace disease within the left apex, anterior upper lobe and less cyst superior segment left lower lobe. No findings to suggest recurrent disease. Upper Abdomen: Hepatic steatosis. Nonspecific  lateral segment left liver lobe enlargement. Normal imaged portions of the stomach, pancreas, gallbladder, adrenal glands, kidneys. Old granulomatous disease in the spleen. Musculoskeletal: Left breast nodularity, including at 1.0 cm on 61/2. This may be increasing compared to 07/29/2019. No acute osseous abnormality. IMPRESSION: 1. Similar appearance of left-sided treatment change, without findings of recurrent or metastatic disease. 2. Resolved left pleural effusion with mild left pleural thickening remaining. 3. Suspicion of an enlarging left breast nodule. Consider correlation with diagnostic mammogram and ultrasound. These results will be called to the ordering clinician or representative by the Radiologist Assistant, and communication documented in the PACS or [employer]. 4. Aortic Atherosclerosis (ICD10-I70.0) and Emphysema (ICD10-J43.9). 5. Age advanced coronary artery atherosclerosis. Recommend assessment of coronary risk factors and consideration of medical therapy. 6. Hepatic steatosis. Electronically Signed   By: Abigail Miyamoto M.D.   On: 01/26/2020 12:35   ECHOCARDIOGRAM COMPLETE  Result Date: 01/27/2020    ECHOCARDIOGRAM REPORT   Patient Name:   Deborah [name] Date of Exam: 01/27/2020 Medical Rec #:  [id number]        Height:       62.0 in Accession #:    [id number]       Weight:       258.0 lb Date of Birth:  04-30-1956       BSA:          2.130 m Patient Age:    64 years         BP:           130/82 mmHg Patient Gender: F                HR:           110 bpm. Exam Location:  [place] Procedure: 2D Echo, Intracardiac Opacification Agent, Color Doppler and Cardiac            Doppler Indications:    R06.02 SOB; R00.0 Tachycardia  History:        Patient has prior history of Echocardiogram examinations, most                 recent 07/26/2014. CHF, COPD, Signs/Symptoms:Shortness of Breath                 and fatigue; Risk Factors:Hypertension, Sleep Apnea and Former                 Smoker.   Sonographer:    Pilar Jarvis RDMS, RVT, RDCS Referring Phys: Max  1. Left ventricular ejection fraction, by estimation, is >55%. The left ventricle has normal function. Left ventricular endocardial border not optimally defined to evaluate regional wall motion. There is moderate left ventricular  hypertrophy. Indeterminate diastolic filling due to E-A fusion.  2. Right ventricular systolic function is normal. The right ventricular size is not well visualized. Mildly increased right ventricular wall thickness. Tricuspid regurgitation signal is inadequate for assessing PA pressure.  3. The mitral valve was not well visualized. No evidence of mitral valve regurgitation.  4. The aortic valve was not well visualized. Aortic valve regurgitation is not visualized. No aortic stenosis is present. FINDINGS  Left Ventricle: Left ventricular ejection fraction, by estimation, is >55%. The left ventricle has normal function. Left ventricular endocardial border not optimally defined to evaluate regional wall motion. Definity contrast agent was given IV to delineate the left ventricular endocardial borders. The left ventricular internal cavity size was normal in size. There is moderate left ventricular hypertrophy. Indeterminate diastolic filling due to E-A fusion. Right Ventricle: The right ventricular size is not well visualized. Mildly increased right ventricular wall thickness. Right ventricular systolic function is normal. Tricuspid regurgitation signal is inadequate for assessing PA pressure. Left Atrium: Left atrial size was not well visualized. Right Atrium: Right atrial size was not well visualized. Pericardium: The pericardium was not well visualized. Presence of pericardial fat pad. Mitral Valve: The mitral valve was not well visualized. No evidence of mitral valve regurgitation. Tricuspid Valve: The tricuspid valve is not well visualized. Tricuspid valve regurgitation is not demonstrated.  Aortic Valve: The aortic valve was not well visualized. Aortic valve regurgitation is not visualized. No aortic stenosis is present. Aortic valve mean gradient measures 6.0 mmHg. Aortic valve peak gradient measures 11.0 mmHg. Aortic valve area, by VTI measures 2.76 cm. Pulmonic Valve: The pulmonic valve was not well visualized. Pulmonic valve regurgitation is not visualized. No evidence of pulmonic stenosis. Aorta: The aortic root and ascending aorta are structurally normal, with no evidence of dilitation. Pulmonary Artery: The pulmonary artery is not well seen. Venous: The inferior vena cava was not well visualized. IAS/Shunts: The interatrial septum was not well visualized.  LEFT VENTRICLE PLAX 2D LVIDd:         4.60 cm  Diastology LVIDs:         3.31 cm  LV e' lateral:   12.70 cm/s LV PW:         1.41 cm  LV E/e' lateral: 9.6 LV IVS:        1.23 cm  LV e' medial:    12.10 cm/s LVOT diam:     2.00 cm  LV E/e' medial:  10.1 LV SV:         73 LV SV Index:   34 LVOT Area:     3.14 cm  RIGHT VENTRICLE RV S prime:     19.30 cm/s TAPSE (M-mode): 2.2 cm LEFT ATRIUM         Index LA diam:    3.60 cm 1.69 cm/m  AORTIC VALVE                    PULMONIC VALVE AV Area (Vmax):    2.54 cm     PV Vmax:       1.06 m/s AV Area (Vmean):   2.63 cm     PV Peak grad:  4.5 mmHg AV Area (VTI):     2.76 cm AV Vmax:           166.00 cm/s AV Vmean:          114.000 cm/s AV VTI:            0.263 m AV Peak  Grad:      11.0 mmHg AV Mean Grad:      6.0 mmHg LVOT Vmax:         134.00 cm/s LVOT Vmean:        95.400 cm/s LVOT VTI:          0.231 m LVOT/AV VTI ratio: 0.88  AORTA Ao Root diam: 2.90 cm Ao Asc diam:  2.80 cm MITRAL VALVE MV Area (PHT): 7.16 cm     SHUNTS MV Decel Time: 106 msec     Systemic VTI:  0.23 m MV E velocity: 122.00 cm/s  Systemic Diam: 2.00 cm MV A velocity: 59.50 cm/s MV E/A ratio:  2.05 Harrell Gave End MD Electronically signed by Nelva Bush MD Signature Date/Time: 01/27/2020/4:51:02 PM    Final     ASSESSMENT  AND PLAN: This is a very pleasant 64 years old white female with stage IIIB non-small cell lung cancer, squamous cell carcinoma. She underwent a course of concurrent chemoradiation with weekly carboplatin and paclitaxel, status post 7 cycles.  She tolerated this treatment well with no concerning adverse effect except for fatigue. She underwent consolidation treatment with immunotherapy with Imfinzi status post 26 cycles. The patient is currently on observation and she is feeling fine today with no concerning complaints except for the baseline shortness of breath secondary to COPD. She had repeat CT scan of the chest performed recently.  I personally and independently reviewed the scan images and discussed the results with the patient and her husband today. Her scan showed no concerning findings for disease progression but it showed a mass in the left breast that need further evaluation.  Her last mammogram was done more than 20 years ago. I will refer the patient to the breast center for repeat mammogram and evaluation of the suspicious left breast lesion. I will see her back for follow-up visit in 4 months for evaluation with repeat CT scan of the chest for restaging of her disease of the lung. For the COPD, she will continue her current follow-up evaluation by Dr. Chase Caller. She was advised to call immediately if she has any other concerning symptoms in the interval. The patient voices understanding of current disease status and treatment options and is in agreement with the current care plan.  All questions were answered. The patient knows to call the clinic with any problems, questions or concerns. We can certainly see the patient much sooner if necessary.  Disclaimer: This note was dictated with voice recognition software. Similar sounding words can inadvertently be transcribed and may not be corrected upon review.

## 2020-02-02 ENCOUNTER — Other Ambulatory Visit: Payer: Self-pay | Admitting: Internal Medicine

## 2020-02-02 ENCOUNTER — Telehealth: Payer: Self-pay | Admitting: Internal Medicine

## 2020-02-02 DIAGNOSIS — N6320 Unspecified lump in the left breast, unspecified quadrant: Secondary | ICD-10-CM

## 2020-02-02 NOTE — Telephone Encounter (Signed)
Scheduled per los. Called and left msg. Mailed printout  °

## 2020-02-12 ENCOUNTER — Other Ambulatory Visit: Payer: Self-pay | Admitting: Internal Medicine

## 2020-02-12 ENCOUNTER — Ambulatory Visit
Admission: RE | Admit: 2020-02-12 | Discharge: 2020-02-12 | Disposition: A | Discharge status: Home / Self Care | Payer: BLUE CROSS/BLUE SHIELD | Source: Ambulatory Visit | Attending: Internal Medicine | Admitting: Internal Medicine

## 2020-02-12 ENCOUNTER — Other Ambulatory Visit: Payer: Self-pay

## 2020-02-12 DIAGNOSIS — R921 Mammographic calcification found on diagnostic imaging of breast: Secondary | ICD-10-CM | POA: Diagnosis not present

## 2020-02-12 DIAGNOSIS — N6320 Unspecified lump in the left breast, unspecified quadrant: Secondary | ICD-10-CM

## 2020-02-12 DIAGNOSIS — R928 Other abnormal and inconclusive findings on diagnostic imaging of breast: Secondary | ICD-10-CM | POA: Diagnosis not present

## 2020-02-12 DIAGNOSIS — N6321 Unspecified lump in the left breast, upper outer quadrant: Secondary | ICD-10-CM | POA: Diagnosis not present

## 2020-02-12 DIAGNOSIS — C3490 Malignant neoplasm of unspecified part of unspecified bronchus or lung: Secondary | ICD-10-CM | POA: Diagnosis not present

## 2020-02-13 ENCOUNTER — Ambulatory Visit: Payer: BLUE CROSS/BLUE SHIELD | Admitting: Pulmonary Disease

## 2020-02-13 ENCOUNTER — Other Ambulatory Visit: Payer: BLUE CROSS/BLUE SHIELD

## 2020-02-18 ENCOUNTER — Ambulatory Visit (INDEPENDENT_AMBULATORY_CARE_PROVIDER_SITE_OTHER): Payer: BLUE CROSS/BLUE SHIELD | Admitting: Internal Medicine

## 2020-02-18 ENCOUNTER — Other Ambulatory Visit: Payer: Self-pay

## 2020-02-18 DIAGNOSIS — J9611 Chronic respiratory failure with hypoxia: Secondary | ICD-10-CM

## 2020-02-18 DIAGNOSIS — J449 Chronic obstructive pulmonary disease, unspecified: Secondary | ICD-10-CM

## 2020-02-18 DIAGNOSIS — J441 Chronic obstructive pulmonary disease with (acute) exacerbation: Secondary | ICD-10-CM

## 2020-02-18 LAB — PULMONARY FUNCTION TEST
DL/VA % pred: 108 %
DL/VA: 4.59 ml/min/mmHg/L
DLCO cor % pred: 58 %
DLCO cor: 11.41 ml/min/mmHg
DLCO unc % pred: 58 %
DLCO unc: 11.41 ml/min/mmHg
FEF 25-75 Post: 0.78 L/sec
FEF 25-75 Pre: 0.68 L/sec
FEF2575-%Change-Post: 14 %
FEF2575-%Pred-Post: 34 %
FEF2575-%Pred-Pre: 30 %
FEV1-%Change-Post: 5 %
FEV1-%Pred-Post: 40 %
FEV1-%Pred-Pre: 38 %
FEV1-Post: 1 L
FEV1-Pre: 0.95 L
FEV1FVC-%Change-Post: 1 %
FEV1FVC-%Pred-Pre: 94 %
FEV6-%Change-Post: 3 %
FEV6-%Pred-Post: 43 %
FEV6-%Pred-Pre: 42 %
FEV6-Post: 1.33 L
FEV6-Pre: 1.29 L
FEV6FVC-%Pred-Post: 103 %
FEV6FVC-%Pred-Pre: 103 %
FVC-%Change-Post: 3 %
FVC-%Pred-Post: 42 %
FVC-%Pred-Pre: 40 %
FVC-Post: 1.33 L
FVC-Pre: 1.29 L
Post FEV1/FVC ratio: 75 %
Post FEV6/FVC ratio: 100 %
Pre FEV1/FVC ratio: 74 %
Pre FEV6/FVC Ratio: 100 %

## 2020-02-18 NOTE — Progress Notes (Signed)
Spirometry pre/post and DLCO performed today. 

## 2020-02-19 ENCOUNTER — Ambulatory Visit
Admission: RE | Admit: 2020-02-19 | Discharge: 2020-02-19 | Disposition: A | Discharge status: Home / Self Care | Payer: BLUE CROSS/BLUE SHIELD | Source: Ambulatory Visit | Attending: Internal Medicine | Admitting: Internal Medicine

## 2020-02-19 DIAGNOSIS — N6320 Unspecified lump in the left breast, unspecified quadrant: Secondary | ICD-10-CM

## 2020-02-19 DIAGNOSIS — N6321 Unspecified lump in the left breast, upper outer quadrant: Secondary | ICD-10-CM | POA: Diagnosis not present

## 2020-02-19 DIAGNOSIS — C50412 Malignant neoplasm of upper-outer quadrant of left female breast: Secondary | ICD-10-CM | POA: Diagnosis not present

## 2020-02-19 DIAGNOSIS — R921 Mammographic calcification found on diagnostic imaging of breast: Secondary | ICD-10-CM | POA: Diagnosis not present

## 2020-02-23 ENCOUNTER — Telehealth: Payer: Self-pay | Admitting: Internal Medicine

## 2020-02-23 ENCOUNTER — Encounter: Payer: Self-pay | Admitting: Pulmonary Disease

## 2020-02-23 ENCOUNTER — Other Ambulatory Visit: Payer: Self-pay

## 2020-02-23 ENCOUNTER — Ambulatory Visit (INDEPENDENT_AMBULATORY_CARE_PROVIDER_SITE_OTHER): Payer: BLUE CROSS/BLUE SHIELD | Admitting: Pulmonary Disease

## 2020-02-23 ENCOUNTER — Telehealth: Payer: Self-pay | Admitting: *Deleted

## 2020-02-23 VITALS — BP 110/60 | HR 119 | Temp 98.5°F | Ht 62.0 in | Wt 256.2 lb

## 2020-02-23 DIAGNOSIS — I5032 Chronic diastolic (congestive) heart failure: Secondary | ICD-10-CM | POA: Diagnosis not present

## 2020-02-23 DIAGNOSIS — K8050 Calculus of bile duct without cholangitis or cholecystitis without obstruction: Secondary | ICD-10-CM | POA: Diagnosis not present

## 2020-02-23 DIAGNOSIS — G4733 Obstructive sleep apnea (adult) (pediatric): Secondary | ICD-10-CM

## 2020-02-23 DIAGNOSIS — Z7189 Other specified counseling: Secondary | ICD-10-CM | POA: Diagnosis not present

## 2020-02-23 DIAGNOSIS — J9611 Chronic respiratory failure with hypoxia: Secondary | ICD-10-CM | POA: Diagnosis not present

## 2020-02-23 DIAGNOSIS — R5381 Other malaise: Secondary | ICD-10-CM

## 2020-02-23 DIAGNOSIS — C50912 Malignant neoplasm of unspecified site of left female breast: Secondary | ICD-10-CM | POA: Diagnosis not present

## 2020-02-23 DIAGNOSIS — J432 Centrilobular emphysema: Secondary | ICD-10-CM

## 2020-02-23 DIAGNOSIS — I509 Heart failure, unspecified: Secondary | ICD-10-CM | POA: Diagnosis not present

## 2020-02-23 DIAGNOSIS — I272 Pulmonary hypertension, unspecified: Secondary | ICD-10-CM | POA: Diagnosis not present

## 2020-02-23 DIAGNOSIS — J449 Chronic obstructive pulmonary disease, unspecified: Secondary | ICD-10-CM | POA: Diagnosis not present

## 2020-02-23 NOTE — Assessment & Plan Note (Signed)
Plan: Continue Breztri  Short walk in office today Cleared for procedure with CCS, moderate to high risk for pulmonary complications Continue oxygen therapy Establish with new pulmonologist in our office Establish with new sleep MD in our office

## 2020-02-23 NOTE — Assessment & Plan Note (Signed)
Plan: Continue CPAP therapy Patient needs to establish with new sleep MD in our office

## 2020-02-23 NOTE — Telephone Encounter (Signed)
Ardelle Lesches 64 year old female is requesting lumpectomy/mastectomy surgery.  She was last seen in the clinic on December 16, 2019 and was stable at that time.  May her aspirin be held prior to the procedure?  Her PMH includes chronic diastolic heart failure, chronic right heart failure due to COPD, previous tobacco abuse, HTN, obesity, and bipolar disorder.  Echocardiogram showed EF of 55-50%, G1 DD, dilated right ventricle with hypokinesis and mild pulmonary hypertension.  Also diagnosed with squamous cell carcinoma of the lung stage IIIb in October 2019.  She was treated with radiation, chemo, and immunotherapy.  Please direct response to [facility] preop pool.  Thank you for your help.  Jossie Ng. Dorreen Valiente NP-C    02/23/2020, 3:40 PM [place] Office [phone] Fax [phone]

## 2020-02-23 NOTE — Assessment & Plan Note (Signed)
Severely physically deconditioned Only able to walk around 20 feet with walker Patient needs pulmonary rehab referral status post CCS surgical intervention

## 2020-02-23 NOTE — Telephone Encounter (Signed)
   [facility] Pre-operative Risk Assessment    HEARTCARE STAFF: - Please ensure there is not already an duplicate clearance open for this procedure. - Under Visit Info/Reason for Call, type in Other and utilize the format Clearance MM/DD/YY or Clearance TBD. Do not use dashes or single digits. - If request is for dental extraction, please clarify the # of teeth to be extracted.  Request for surgical clearance:  1. What type of surgery is being performed? BREAST SURGERY FOR POSSIBLE LUMPECTOMY OR MASTECTOMY   2. When is this surgery scheduled? TBD   3. What type of clearance is required (medical clearance vs. Pharmacy clearance to hold med vs. Both)? MEDICAL  4. Are there any medications that need to be held prior to surgery and how long? ASA x 5 DAYS PRIOR TO SURGERY   5. Practice name and name of physician performing surgery? [facility]; DR. Shanon Brow [place]  6. What is the office phone number? [phone]   7.   What is the office fax number? [place]: April STATON, CMA  8.   Anesthesia type (None, local, MAC, general) ? GENERAL   Julaine Hua 02/23/2020, 2:38 PM  _________________________________________________________________   (provider comments below)

## 2020-02-23 NOTE — Assessment & Plan Note (Signed)
Plan: Continue oxygen therapy at this time Walk in office patient need 4 L with physical exertion

## 2020-02-23 NOTE — Assessment & Plan Note (Signed)
Plan: Keep cardiovascular follow-up Needs cardiovascular clearance for surgery

## 2020-02-23 NOTE — Patient Instructions (Addendum)
You were seen today by Lauraine Rinne, NP  for:   1. Surgical counseling visit  Peri-operative Assessment of Pulmonary Risk for Non-Thoracic Surgery:  ForMs. Lim, moderate to high risk of perioperative pulmonary complications is increased by:  Diffusion Defect   Chronic O2  COPD / Emphysema   Hx of Smoking  Obstructive sleep apnea  NYHA Class III Pulmonary Hypertension  Physically Deconditioned  Respiratory complications generally occur in 1% of ASA Class I patients, 5% of ASA Class II and 10% of ASA Class III-IV patients These complications rarely result in mortality and iclude postoperative pneumonia, atelectasis, pulmonary embolism, ARDS and increased time requiring postoperative mechanical ventilation.  Overall, I recommend proceeding with the surgery if the risk for respiratory complications are outweighed by the potential benefits. This will need to be discussed between the patient and surgeon.  To reduce risks of respiratory complications, I recommend: --Pre- and post-operative incentive spirometry performed frequently while awake --Inpatient use of currently prescribed positive-pressure for OSA whenever the patient is sleeping --Avoiding use of pancuronium during anesthesia. --Needs cards surg approval   I have discussed the risk factors and recommendations above with the patient.   2. Chronic respiratory failure with hypoxia (HCC)  Short walk in office today, patient's oxygen did not drop on 4 L with physical exertion  Continue oxygen therapy as prescribed  >>>maintain oxygen saturations greater than 88 percent  >>>if unable to maintain oxygen saturations please contact the office  >>>do not smoke with oxygen  >>>can use nasal saline gel or nasal saline rinses to moisturize nose if oxygen causes dryness  3. Chronic diastolic heart failure (HCC) 4. Pulmonary HTN ([place])  Keep follow-up with cardiology  Need cardiovascular preop surgical clearance  5. Centrilobular  emphysema (HCC)  Breztri >>> 2 puffs in the morning right when you wake up, rinse out your mouth after use, 12 hours later 2 puffs, rinse after use >>> Take this daily, no matter what >>> This is not a rescue inhaler   Note your daily symptoms > remember "red flags" for COPD:   >>>Increase in cough >>>increase in sputum production >>>increase in shortness of breath or activity  intolerance.   If you notice these symptoms, please call the office to be seen.    6. Physical deconditioning  Work on increasing overall physical activity as you are very deconditioned  Agree with pulmonary rehab referral after CCS procedure, oncology eval  7. OSA (obstructive sleep apnea)  We need to have you establish with a pulmonary sleep MD  We recommend that you continue using your CPAP daily >>>Keep up the hard work using your device >>> Goal should be wearing this for the entire night that you are sleeping, at least 4 to 6 hours  Remember:  . Do not drive or operate heavy machinery if tired or drowsy.  . Please notify the supply company and office if you are unable to use your device regularly due to missing supplies or machine being broken.  . Work on maintaining a healthy weight and following your recommended nutrition plan  . Maintain proper daily exercise and movement  . Maintaining proper use of your device can also help improve management of other chronic illnesses such as: Blood pressure, blood sugars, and weight management.   BiPAP/ CPAP Cleaning:  >>>Clean weekly, with Dawn soap, and bottle brush.  Set up to air dry. >>> Wipe mask out daily with wet wipe or towelette     Follow Up:  Return in about 6 weeks (around 04/05/2020), or if symptoms worsen or fail to improve, for NEW PULMONOLOGIST IN 55min SLOT.  Either: Dr. Shearon Stalls, Dr. Erin Fulling, Dr. Silas Flood  Needs pulmonary sleep MD, 30-minute visit >>> Dr. Halford Chessman or Dr. Jenetta Downer      Please do your part to reduce the spread of  COVID-19:      Reduce your risk of any infection  and COVID19 by using the similar precautions used for avoiding the common cold or flu:  Marland Kitchen Wash your hands often with soap and warm water for at least 20 seconds.  If soap and water are not readily available, use an alcohol-based hand sanitizer with at least 60% alcohol.  . If coughing or sneezing, cover your mouth and nose by coughing or sneezing into the elbow areas of your shirt or coat, into a tissue or into your sleeve (not your hands). Langley Gauss A MASK when in public  . Avoid shaking hands with others and consider head nods or verbal greetings only. . Avoid touching your eyes, nose, or mouth with unwashed hands.  . Avoid close contact with people who are sick. . Avoid places or events with large numbers of people in one location, like concerts or sporting events. . If you have some symptoms but not all symptoms, continue to monitor at home and seek medical attention if your symptoms worsen. . If you are having a medical emergency, call 911.   [place] / e-Visit: [email or web address]         [facility]: [facility]: [phone]                   [facility]: [phone]     It is flu season:   >>> Best ways to protect herself from the flu: Receive the yearly flu vaccine, practice good hand hygiene washing with soap and also using hand sanitizer when available, eat a nutritious meals, get adequate rest, hydrate appropriately   Please contact the office if your symptoms worsen or you have concerns that you are not improving.   Thank you for choosing [facility] for your healthcare, and for allowing [place] to partner with you on your healthcare journey. I am thankful to be able to provide care to you today.   Wyn Quaker FNP-C

## 2020-02-23 NOTE — Telephone Encounter (Signed)
Received a staff msg to schedule Deborah [name] for a new pt breast appt. She's an established pt of Dr. Julien Nordmann. Per MD ok to schedule with one of the breast docs. I cld and left a vm to schedule an appt.

## 2020-02-23 NOTE — Progress Notes (Signed)
@Patient  ID: Deborah [name], female    DOB: 1955/10/18, 64 y.o.   MRN: [id number]  Chief Complaint  Patient presents with  . Follow-up    COPD, pfts    Referring provider: Pleas Koch, NP  HPI:  64 year old female former smoker followed in our office for COPD, suspected restrictive lung disease and lung cancer followed by Dr. Earlie Server.  PMH: Chronic diastolic heart failure, hypertension, morbid obesity, pulmonary hypertension Smoker/ Smoking History: Former smoker.  Quit 2014.  129-pack-year smoking history. Maintenance:  Breztri  Pt of: Dr. Chase Caller  02/23/2020  - Visit   64 year old female former smoker who establish care with Dr. Chase Caller in June/2021.  Plan of care at that office visit was for the patient obtain lab work, start [employer], prednisone taper, check ABG, referral to pulmonary rehab, and complete breathing test.  Patient completed pulmonary function testing recently those results are listed below:  02/18/2020-pulmonary function test-FVC 1.29 (40% predicted), postbronchodilator ratio 75, postbronchodilator FEV1 1.00 (40% predicted), no bronchodilator response, DLCO 11.41 (58% predicted)  Patient is also followed by Dr. Earlie Server.  Patient was last seen by Dr. Julien Nordmann on 01/28/2020.  Plan of care from that office visit was as follows:  ASSESSMENT AND PLAN: This is a very pleasant 64 years old white female with stage IIIB non-small cell lung cancer, squamous cell carcinoma. She underwent a course of concurrent chemoradiation with weekly carboplatin and paclitaxel, status post 7 cycles.  She tolerated this treatment well with no concerning adverse effect except for fatigue. She underwent consolidation treatment with immunotherapy with Imfinzi status post 26 cycles. The patient is currently on observation and she is feeling fine today with no concerning complaints except for the baseline shortness of breath secondary to COPD. She had repeat CT scan of the chest  performed recently.  I personally and independently reviewed the scan images and discussed the results with the patient and her husband today. Her scan showed no concerning findings for disease progression but it showed a mass in the left breast that need further evaluation.  Her last mammogram was done more than 20 years ago. I will refer the patient to the breast center for repeat mammogram and evaluation of the suspicious left breast lesion. I will see her back for follow-up visit in 4 months for evaluation with repeat CT scan of the chest for restaging of her disease of the lung. For the COPD, she will continue her current follow-up evaluation by Dr. Chase Caller. She was advised to call immediately if she has any other concerning symptoms in the interval. The patient voices understanding of current disease status and treatment options and is in agreement with the current care plan.   Chart review also shows that on 02/20/2020 there is an addendum that showed the patient's pathology revealed grade 3 invasive ductal carcinoma in the left breast.  Patient was scheduled with Dr. [name] with [facility].  Dr. Earlie Server was also notified.  Patient has completed follow-up with Dr. [name] with [facility].  She is requesting surgical clearance today.  We will discuss and evaluate this.  Patient is not sure if the Judithann Sauger is helping.  We will discuss and review this today.   Questionaires / Pulmonary Flowsheets:   ACT:  No flowsheet data found.  MMRC: mMRC Dyspnea Scale mMRC Score  02/23/2020 3    Epworth:  No flowsheet data found.  Tests:   02/18/2020-pulmonary function test-FVC 1.29 (40% predicted), postbronchodilator ratio 75, postbronchodilator FEV1 1.00 (40% predicted), no bronchodilator response,  DLCO 11.41 (58% predicted)  01/26/2020-CT chest with contrast-similar appearance of left-sided treatment change without findings of recurrent or metastatic disease, resolved left pleural effusion with mild left  pleural thickening remaining, suspicion of enlarging left breast nodule, consider correlation with diagnostic mammogram and ultrasound, aortic arthrosclerosis, age advanced coronary arthrosclerosis  01/14/2020-alpha-1 antitrypsin-180, phenotype PI*MM  FENO:  No results found for: NITRICOXIDE  PFT: PFT Results Latest Ref Rng & Units 02/18/2020 01/01/2017 06/05/2013  FVC-Pre L 1.29 1.76 -  FVC-Predicted Pre % 40 54 54  FVC-Post L 1.33 1.88 1.96  FVC-Predicted Post % 42 58 59  Pre FEV1/FVC % % 74 75 82  Post FEV1/FCV % % 75 80 81  FEV1-Pre L 0.95 1.32 1.47  FEV1-Predicted Pre % 38 52 56  FEV1-Post L 1.00 1.50 1.58  DLCO uncorrected ml/min/mmHg 11.41 14.89 13.94  DLCO UNC% % 58 64 60  DLCO corrected ml/min/mmHg 11.41 14.98 -  DLCO COR %Predicted % 58 65 -  DLVA Predicted % 108 92 96  TLC L - 4.16 4.13  TLC % Predicted % - 84 84  RV % Predicted % - 118 111    WALK:  SIX MIN WALK 04/20/2016 03/24/2015 08/26/2014 04/17/2013  Supplimental Oxygen during Test? (L/min) - No Yes No  O2 Flow Rate - - 2 -  Type - - Continuous -  2 Minute Oxygen Saturation % 93 - - -  2 Minute HR 125 - - -  4 Minute Oxygen Saturation % 93 - - -  4 Minute HR 132 - - -  6 Minute Oxygen Saturation % 92 - - -  6 Minute HR 154 - - -  Tech Comments: - stopped after 2nd lap due to fatigue, sob Pt had c/o sob after completion of 1st lap. Pt was able to complete 3 laps on 2 L 02. -    Imaging: CT Chest W Contrast  Result Date: 01/26/2020 CLINICAL DATA:  Left-sided lung cancer diagnosed 9/19. Chemotherapy, radiation therapy and immunotherapy complete. Hypertension. COPD. Ex-smoker. EXAM: CT CHEST WITH CONTRAST TECHNIQUE: Multidetector CT imaging of the chest was performed during intravenous contrast administration. CONTRAST:  46m OMNIPAQUE IOHEXOL 300 MG/ML  SOLN COMPARISON:  10/27/2019 FINDINGS: Cardiovascular: Advanced aortic and branch vessel atherosclerosis. Tortuous thoracic aorta. Mild cardiomegaly with lipomatous  hypertrophy of the interatrial septum. No pericardial effusion. Multivessel coronary artery atherosclerosis. No central pulmonary embolism, on this non-dedicated study. Mediastinum/Nodes: No supraclavicular adenopathy. Calcified right hilar nodes are likely related to old granulomatous disease. No mediastinal or hilar adenopathy. Lungs/Pleura: Resolved left pleural effusion with mild left pleural thickening remaining. Moderate centrilobular emphysema. Improved right lower lobe aeration, with only mild residual interstitial thickening remaining. Relatively similar appearance of areas of architectural distortion, ground-glass, and mild airspace disease within the left apex, anterior upper lobe and less cyst superior segment left lower lobe. No findings to suggest recurrent disease. Upper Abdomen: Hepatic steatosis. Nonspecific lateral segment left liver lobe enlargement. Normal imaged portions of the stomach, pancreas, gallbladder, adrenal glands, kidneys. Old granulomatous disease in the spleen. Musculoskeletal: Left breast nodularity, including at 1.0 cm on 61/2. This may be increasing compared to 07/29/2019. No acute osseous abnormality. IMPRESSION: 1. Similar appearance of left-sided treatment change, without findings of recurrent or metastatic disease. 2. Resolved left pleural effusion with mild left pleural thickening remaining. 3. Suspicion of an enlarging left breast nodule. Consider correlation with diagnostic mammogram and ultrasound. These results will be called to the ordering clinician or representative by the Radiologist Assistant, and communication  documented in the PACS or [employer]. 4. Aortic Atherosclerosis (ICD10-I70.0) and Emphysema (ICD10-J43.9). 5. Age advanced coronary artery atherosclerosis. Recommend assessment of coronary risk factors and consideration of medical therapy. 6. Hepatic steatosis. Electronically Signed   By: Abigail Miyamoto M.D.   On: 01/26/2020 12:35   US BREAST LTD UNI  LEFT INC AXILLA  Result Date: 02/12/2020 CLINICAL DATA:  64 year old patient with known lung cancer recently had a chest CT, and a possible left breast mass was identified on the CT. She presents for evaluation. EXAM: DIGITAL DIAGNOSTIC BILATERAL MAMMOGRAM WITH CAD AND TOMO ULTRASOUND LEFT BREAST COMPARISON:  Recent chest CT January 26, 2020 ACR Breast Density Category b: There are scattered areas of fibroglandular density. FINDINGS: Images performed with the patient in a wheelchair. Some posterior tissue is excluded due to decreased mobility. There are scattered circumscribed low-density nodules bilaterally, a benign finding. In the upper-outer quadrant of the left breast is a dense irregular mass with associated pleomorphic microcalcifications. Mass in suspicious microcalcifications span 3.2 x 2.3 x 0.9 cm. No findings to suggest malignancy in the right breast. Mammographic images were processed with CAD. Targeted ultrasound is performed, showing a hypoechoic irregular mass with internal calcifications and prominent vascular flow. Contiguous with the mass is a dilated duct with internal echogenic soft tissue and internal calcifications. Findings are at 2 o'clock position 9 cm from the nipple and in total measure 3.7 x 1.1 x 1.9 cm. Ultrasound the left axilla is negative for lymphadenopathy. IMPRESSION: Findings highly suspicious for malignancy in the left breast, likely including and invasive and in situ component. No evidence of malignancy in the right breast. RECOMMENDATION: Ultrasound-guided core needle biopsy of the left breast mass is recommended and has been scheduled for the patient I have discussed the findings and recommendations with the patient. If applicable, a reminder letter will be sent to the patient regarding the next appointment. BI-RADS CATEGORY  5: Highly suggestive of malignancy. Electronically Signed   By: Curlene Dolphin M.D.   On: 02/12/2020 16:35   MM DIAG BREAST TOMO BILATERAL  Result  Date: 02/12/2020 CLINICAL DATA:  64 year old patient with known lung cancer recently had a chest CT, and a possible left breast mass was identified on the CT. She presents for evaluation. EXAM: DIGITAL DIAGNOSTIC BILATERAL MAMMOGRAM WITH CAD AND TOMO ULTRASOUND LEFT BREAST COMPARISON:  Recent chest CT January 26, 2020 ACR Breast Density Category b: There are scattered areas of fibroglandular density. FINDINGS: Images performed with the patient in a wheelchair. Some posterior tissue is excluded due to decreased mobility. There are scattered circumscribed low-density nodules bilaterally, a benign finding. In the upper-outer quadrant of the left breast is a dense irregular mass with associated pleomorphic microcalcifications. Mass in suspicious microcalcifications span 3.2 x 2.3 x 0.9 cm. No findings to suggest malignancy in the right breast. Mammographic images were processed with CAD. Targeted ultrasound is performed, showing a hypoechoic irregular mass with internal calcifications and prominent vascular flow. Contiguous with the mass is a dilated duct with internal echogenic soft tissue and internal calcifications. Findings are at 2 o'clock position 9 cm from the nipple and in total measure 3.7 x 1.1 x 1.9 cm. Ultrasound the left axilla is negative for lymphadenopathy. IMPRESSION: Findings highly suspicious for malignancy in the left breast, likely including and invasive and in situ component. No evidence of malignancy in the right breast. RECOMMENDATION: Ultrasound-guided core needle biopsy of the left breast mass is recommended and has been scheduled for the patient I have discussed  the findings and recommendations with the patient. If applicable, a reminder letter will be sent to the patient regarding the next appointment. BI-RADS CATEGORY  5: Highly suggestive of malignancy. Electronically Signed   By: Curlene Dolphin M.D.   On: 02/12/2020 16:35   ECHOCARDIOGRAM COMPLETE  Result Date: 01/27/2020    ECHOCARDIOGRAM  REPORT   Patient Name:   TRANIKA SCHOLLER Date of Exam: 01/27/2020 Medical Rec #:  [id number]        Height:       62.0 in Accession #:    [id number]       Weight:       258.0 lb Date of Birth:  Nov 20, 1955       BSA:          2.130 m Patient Age:    14 years         BP:           130/82 mmHg Patient Gender: F                HR:           110 bpm. Exam Location:  [facility] Procedure: 2D Echo, Intracardiac Opacification Agent, Color Doppler and Cardiac            Doppler Indications:    R06.02 SOB; R00.0 Tachycardia  History:        Patient has prior history of Echocardiogram examinations, most                 recent 07/26/2014. CHF, COPD, Signs/Symptoms:Shortness of Breath                 and fatigue; Risk Factors:Hypertension, Sleep Apnea and Former                 Smoker.  Sonographer:    Pilar Jarvis RDMS, RVT, RDCS Referring Phys: [place]  1. Left ventricular ejection fraction, by estimation, is >55%. The left ventricle has normal function. Left ventricular endocardial border not optimally defined to evaluate regional wall motion. There is moderate left ventricular hypertrophy. Indeterminate diastolic filling due to E-A fusion.  2. Right ventricular systolic function is normal. The right ventricular size is not well visualized. Mildly increased right ventricular wall thickness. Tricuspid regurgitation signal is inadequate for assessing PA pressure.  3. The mitral valve was not well visualized. No evidence of mitral valve regurgitation.  4. The aortic valve was not well visualized. Aortic valve regurgitation is not visualized. No aortic stenosis is present. FINDINGS  Left Ventricle: Left ventricular ejection fraction, by estimation, is >55%. The left ventricle has normal function. Left ventricular endocardial border not optimally defined to evaluate regional wall motion. Definity contrast agent was given IV to delineate the left ventricular endocardial borders. The left ventricular internal  cavity size was normal in size. There is moderate left ventricular hypertrophy. Indeterminate diastolic filling due to E-A fusion. Right Ventricle: The right ventricular size is not well visualized. Mildly increased right ventricular wall thickness. Right ventricular systolic function is normal. Tricuspid regurgitation signal is inadequate for assessing PA pressure. Left Atrium: Left atrial size was not well visualized. Right Atrium: Right atrial size was not well visualized. Pericardium: The pericardium was not well visualized. Presence of pericardial fat pad. Mitral Valve: The mitral valve was not well visualized. No evidence of mitral valve regurgitation. Tricuspid Valve: The tricuspid valve is not well visualized. Tricuspid valve regurgitation is not demonstrated. Aortic Valve: The aortic valve was not well  visualized. Aortic valve regurgitation is not visualized. No aortic stenosis is present. Aortic valve mean gradient measures 6.0 mmHg. Aortic valve peak gradient measures 11.0 mmHg. Aortic valve area, by VTI measures 2.76 cm. Pulmonic Valve: The pulmonic valve was not well visualized. Pulmonic valve regurgitation is not visualized. No evidence of pulmonic stenosis. Aorta: The aortic root and ascending aorta are structurally normal, with no evidence of dilitation. Pulmonary Artery: The pulmonary artery is not well seen. Venous: The inferior vena cava was not well visualized. IAS/Shunts: The interatrial septum was not well visualized.  LEFT VENTRICLE PLAX 2D LVIDd:         4.60 cm  Diastology LVIDs:         3.31 cm  LV e' lateral:   12.70 cm/s LV PW:         1.41 cm  LV E/e' lateral: 9.6 LV IVS:        1.23 cm  LV e' medial:    12.10 cm/s LVOT diam:     2.00 cm  LV E/e' medial:  10.1 LV SV:         73 LV SV Index:   34 LVOT Area:     3.14 cm  RIGHT VENTRICLE RV S prime:     19.30 cm/s TAPSE (M-mode): 2.2 cm LEFT ATRIUM         Index LA diam:    3.60 cm 1.69 cm/m  AORTIC VALVE                    PULMONIC  VALVE AV Area (Vmax):    2.54 cm     PV Vmax:       1.06 m/s AV Area (Vmean):   2.63 cm     PV Peak grad:  4.5 mmHg AV Area (VTI):     2.76 cm AV Vmax:           166.00 cm/s AV Vmean:          114.000 cm/s AV VTI:            0.263 m AV Peak Grad:      11.0 mmHg AV Mean Grad:      6.0 mmHg LVOT Vmax:         134.00 cm/s LVOT Vmean:        95.400 cm/s LVOT VTI:          0.231 m LVOT/AV VTI ratio: 0.88  AORTA Ao Root diam: 2.90 cm Ao Asc diam:  2.80 cm MITRAL VALVE MV Area (PHT): 7.16 cm     SHUNTS MV Decel Time: 106 msec     Systemic VTI:  0.23 m MV E velocity: 122.00 cm/s  Systemic Diam: 2.00 cm MV A velocity: 59.50 cm/s MV E/A ratio:  2.05 Harrell Gave End MD Electronically signed by Nelva Bush MD Signature Date/Time: 01/27/2020/4:51:02 PM    Final    MM CLIP PLACEMENT LEFT  Result Date: 02/19/2020 CLINICAL DATA:  Confirmation of clip placement after ultrasound-guided core needle biopsy of a highly suspicious mass associated with calcifications involving the UPPER OUTER QUADRANT the LEFT breast. EXAM: 2D AND TOMOSYNTHESIS DIAGNOSTIC LEFT MAMMOGRAM POST ULTRASOUND BIOPSY COMPARISON:  Previous exam(s). FINDINGS: Tomosynthesis and synthesized full field CC and mediolateral images were obtained following ultrasound guided biopsy of a highly suspicious mass associated with calcifications in the UPPER OUTER QUADRANT of the LEFT breast. The ribbon shaped tissue marking clip is appropriately position within the biopsied mass in the [place] at  MIDDLE depth. Expected post biopsy changes are present without evidence of hematoma. IMPRESSION: Appropriate positioning of the ribbon shaped tissue marking clip within the biopsied mass in the [place] of the LEFT breast. Final Assessment: Post Procedure Mammograms for Marker Placement Electronically Signed   By: Evangeline Dakin M.D.   On: 02/19/2020 08:29   [place] LT BREAST BX W LOC DEV 1ST LESION IMG BX SPEC US GUIDE  Addendum Date: 02/23/2020     ADDENDUM REPORT: 02/20/2020 11:16 ADDENDUM: Pathology revealed GRADE III INVASIVE DUCTAL CARCINOMA of the Left breast, upper outer quadrant 2 o'clock, 9cmfn. This was found to be concordant by Dr. Peggye Fothergill. Pathology results were discussed with the patient by telephone. The patient reported doing well after the biopsy with tenderness at the site. Post biopsy instructions and care were reviewed and questions were answered. The patient was encouraged to call [facility] for any additional concerns. My direct phone number was provided. Surgical consultation has been arranged with Dr. Alphonsa Overall at [facility] on February 23, 2020. Dr. Curt Bears at [facility] was notified of biopsy results via EPIC message on February 20, 2020. Pathology results reported by Terie Purser, RN on 02/20/2020. Electronically Signed   By: Evangeline Dakin M.D.   On: 02/20/2020 11:16   Result Date: 02/23/2020 CLINICAL DATA:  64 year old who is currently undergoing treatment for lung cancer, shown on the CT chest to have a LEFT breast mass, confirmed on recent diagnostic workup to have a highly suspicious 3.7 cm mass associated with calcifications in the UPPER OUTER QUADRANT of the LEFT breast at the 2 o'clock position approximately 9 cm from the nipple. EXAM: ULTRASOUND GUIDED LEFT BREAST CORE NEEDLE BIOPSY COMPARISON:  Previous exam(s). PROCEDURE: I met with the patient and we discussed the procedure of ultrasound-guided biopsy, including benefits and alternatives. We discussed the high likelihood of a successful procedure. We discussed the risks of the procedure, including infection, bleeding, tissue injury, clip migration, and inadequate sampling. Informed written consent was given. The usual time-out protocol was performed immediately prior to the procedure. Lesion quadrant: UPPER OUTER QUADRANT. Using sterile technique with chlorhexidine as skin antisepsis, 1% lidocaine and  1% lidocaine with epinephrine as local anesthetic, under direct ultrasound visualization, a 12 gauge Bard Marquee core needle device placed through an 11 gauge introducer needle was used to perform biopsy of the mass and associated calcifications in the UPPER OUTER QUADRANT of the LEFT breast using a lateral approach. At the conclusion of the procedure a ribbon shaped tissue marker clip was deployed into the biopsy cavity. Follow up 2 view mammogram was performed and dictated separately. IMPRESSION: Ultrasound guided biopsy of a highly suspicious mass associated with calcifications in the UPPER OUTER QUADRANT of the LEFT breast. No apparent complications. Electronically Signed: By: Evangeline Dakin M.D. On: 02/19/2020 08:29    Lab Results:  CBC    Component Value Date/Time   WBC 10.0 01/26/2020 1019   WBC 9.2 01/14/2020 0934   RBC 4.53 01/26/2020 1019   HGB 12.6 01/26/2020 1019   HCT 41.7 01/26/2020 1019   PLT 280 01/26/2020 1019   MCV 92.1 01/26/2020 1019   MCH 27.8 01/26/2020 1019   MCHC 30.2 01/26/2020 1019   RDW 14.9 01/26/2020 1019   LYMPHSABS 1.3 01/26/2020 1019   MONOABS 0.6 01/26/2020 1019   EOSABS 0.1 01/26/2020 1019   BASOSABS 0.0 01/26/2020 1019    BMET    Component Value Date/Time  NA 143 01/26/2020 1019   NA 143 11/23/2016 1504   K 3.9 01/26/2020 1019   CL 105 01/26/2020 1019   CO2 27 01/26/2020 1019   GLUCOSE 99 01/26/2020 1019   BUN 15 01/26/2020 1019   BUN 14 11/23/2016 1504   CREATININE 0.77 01/26/2020 1019   CALCIUM 9.2 01/26/2020 1019   GFRNONAA >60 01/26/2020 1019   GFRAA >60 01/26/2020 1019    BNP    Component Value Date/Time   BNP 166.0 (H) 12/18/2015 0003    ProBNP No results found for: PROBNP  Specialty Problems      Pulmonary Problems   Chronic respiratory failure (HCC)    Followed in Pulmonary clinic/ [facility]/ [facility] - 04/17/2013  Walked RA x 1 laps @ 185 ft each stopped due to  desat 86% rapid pace  From resting sat 94%        Dyspnea    Followed in Pulmonary clinic/ [facility]/ [facility] - hfa 75% p coaching 04/17/2013  - PFT's 06/05/2013 no airflow obst, dlco 60 corrects to 96%      CRLD (chronic restrictive lung disease) secondary to super morbid obesity with mild COPD component   OSA (obstructive sleep apnea)    Auto CPAP 5-10  2 L oxygen blended      COPD exacerbation (HCC)   COPD (chronic obstructive pulmonary disease) (HCC)   Primary malignant neoplasm of bronchus of left lower lobe (HCC)      No Known Allergies  Immunization History  Administered Date(s) Administered  . Influenza,inj,Quad PF,6+ Mos 04/09/2013, 07/27/2014, 04/18/2016, 05/28/2017, 04/03/2018, 04/08/2019  . PFIZER SARS-COV-2 Vaccination 09/28/2019, 10/18/2019  . Pneumococcal Conjugate-13 01/01/2017  . Pneumococcal Polysaccharide-23 07/27/2014  . Tdap 11/12/2017  . Zoster Recombinat (Shingrix) 11/12/2019, 01/14/2020    Past Medical History:  Diagnosis Date  . Anemia    as teen  . Asthma   . CHF (congestive heart failure) ([place])   . COPD, mild ([place])   . Depression   . Diverticulitis   . Family history of anesthesia complication    vomiting  . GI bleeding   . Heart failure ([place])    New onset 07/25/14  . Histoplasmosis    left eye  . Hyperkalemia   . Hypertension   . Lung mass 04/19/2018  . Obesity (BMI 30-39.9)   . Pneumonia    dx wtih pneumonia on 05/27/16- seen by [facility]   . PONV (postoperative nausea and vomiting)   . Restrictive lung disease   . Shortness of breath    with exertion   . Sleep apnea    mask and oxygen at nite for sleep at 2L   . Tobacco abuse   . Umbilical hernia     Tobacco History: Social History   Tobacco Use  Smoking Status Former Smoker  . Packs/day: 3.00  . Years: 43.00  . Pack years: 129.00  . Types: Cigarettes  . Quit date: 12/17/2012  . Years since quitting: 7.1  Smokeless Tobacco Never Used   Counseling given: Not Answered   Continue to not smoke  Outpatient  Encounter Medications as of 02/23/2020  Medication Sig  . albuterol (VENTOLIN HFA) 108 (90 Base) MCG/ACT inhaler Inhale 2 puffs into the lungs every 6 (six) hours as needed for wheezing or shortness of breath.  Marland Kitchen aspirin 81 MG chewable tablet Chew 1 tablet (81 mg total) by mouth daily.  . Budeson-Glycopyrrol-Formoterol (BREZTRI AEROSPHERE) 160-9-4.8 MCG/ACT AERO Inhale 2 puffs into the lungs daily. (Patient taking differently:  Inhale 2 puffs into the lungs 2 (two) times daily. )  . Cholecalciferol (D3 ADULT PO) Take 1,000 mg by mouth.  . ciclopirox (CICLODAN) 0.77 % cream Apply topically 2 (two) times daily.  Marland Kitchen diltiazem (CARDIZEM CD) 240 MG 24 hr capsule TAKE 1 CAPSULE BY MOUTH EVERY DAY  . furosemide (LASIX) 20 MG tablet Take 2 tablets (40 mg total) by mouth daily.  . montelukast (SINGULAIR) 10 MG tablet Take 1 tablet (10 mg total) by mouth at bedtime.  . OXYGEN Inhale 4 L/min into the lungs continuous.   . potassium chloride SA (KLOR-CON M20) 20 MEQ tablet TAKE 1/2 TABLET BY MOUTH EVERY DAY  . predniSONE (DELTASONE) 10 MG tablet Take 1 tablet (10 mg total) by mouth daily with breakfast.  . Spacer/Aero-Holding Chambers (AEROCHAMBER MV) inhaler Use as instructed  . vitamin B-12 (CYANOCOBALAMIN) 500 MCG tablet Take 500 mcg by mouth daily.   No facility-administered encounter medications on file as of 02/23/2020.     Review of Systems  Review of Systems  Constitutional: Positive for fatigue. Negative for activity change and fever.  HENT: Negative for sinus pressure, sinus pain and sore throat.   Respiratory: Positive for cough, shortness of breath and wheezing.   Cardiovascular: Negative for chest pain and palpitations.  Gastrointestinal: Negative for diarrhea, nausea and vomiting.  Musculoskeletal: Negative for arthralgias.  Neurological: Positive for weakness. Negative for dizziness.  Psychiatric/Behavioral: Negative for sleep disturbance. The patient is not nervous/anxious.       Physical Exam  BP 110/60 (BP Location: Left Arm, Cuff Size: Large)   Pulse (!) 119   Temp 98.5 F (36.9 C) (Oral)   Ht 5' 2"  (1.575 m)   Wt 256 lb 3.2 oz (116.2 kg)   SpO2 92%   BMI 46.86 kg/m   Wt Readings from Last 5 Encounters:  02/23/20 256 lb 3.2 oz (116.2 kg)  01/28/20 255 lb (115.7 kg)  01/14/20 258 lb (117 kg)  12/16/19 263 lb (119.3 kg)  12/16/19 261 lb 8 oz (118.6 kg)    BMI Readings from Last 5 Encounters:  02/23/20 46.86 kg/m  01/28/20 46.64 kg/m  01/14/20 47.19 kg/m  12/16/19 48.10 kg/m  12/16/19 47.83 kg/m     Physical Exam Vitals and nursing note reviewed.  Constitutional:      General: She is not in acute distress.    Appearance: Normal appearance. She is obese.  HENT:     Head: Normocephalic and atraumatic.     Right Ear: Tympanic membrane, ear canal and external ear normal. There is no impacted cerumen.     Left Ear: Tympanic membrane, ear canal and external ear normal. There is no impacted cerumen.     Nose: Nose normal. No congestion.     Mouth/Throat:     Mouth: Mucous membranes are moist.     Pharynx: Oropharynx is clear.  Eyes:     Pupils: Pupils are equal, round, and reactive to light.  Cardiovascular:     Rate and Rhythm: Normal rate and regular rhythm.     Pulses: Normal pulses.     Heart sounds: Normal heart sounds. No murmur heard.   Pulmonary:     Breath sounds: No decreased air movement. No decreased breath sounds, wheezing or rales.  Musculoskeletal:     Cervical back: Normal range of motion.     Right lower leg: No edema.     Left lower leg: No edema.  Skin:    General: Skin is warm and dry.  Capillary Refill: Capillary refill takes less than 2 seconds.  Neurological:     General: No focal deficit present.     Mental Status: She is alert and oriented to person, place, and time. Mental status is at baseline.     Motor: Weakness (In wheelchair) present.     Gait: Gait abnormal.  Psychiatric:        Mood and  Affect: Mood normal.        Behavior: Behavior normal.        Thought Content: Thought content normal.        Judgment: Judgment normal.       Assessment & Plan:   Discussion: Pleasant patient.  Will clear patient for surgical procedure with [facility]. As has discussed with patient today she is a moderate to high risk for pulmonary complications.  Patient needs to be cleared by cardiology for surgical clearance.  Patient also to keep follow-up with Dr. Earlie Server as well as to establish with a breast oncologist.  We will establish with a new pulmonologist in our practice as patient is having difficulty getting in to see Dr. Chase Caller as he is a hybrid doc as well as an ILD specialist.  Pulmonary HTN Plan: We will establish with new pulmonologist in our office Patient needs cardiovascular clearance   Chronic diastolic heart failure ([place]) Plan: Keep cardiovascular follow-up Needs cardiovascular clearance for surgery  OSA (obstructive sleep apnea) Plan: Continue CPAP therapy Patient needs to establish with new sleep MD in our office  COPD (chronic obstructive pulmonary disease) ([place]) Plan: Continue Breztri  Short walk in office today Cleared for procedure with [facility], moderate to high risk for pulmonary complications Continue oxygen therapy Establish with new pulmonologist in our office Establish with new sleep MD in our office  Chronic respiratory failure ([place]) Plan: Continue oxygen therapy at this time Walk in office patient need 4 L with physical exertion  Physical deconditioning Severely physically deconditioned Only able to walk around 20 feet with walker Patient needs pulmonary rehab referral status post [facility] surgical intervention  Surgical counseling visit  Peri-operative Assessment of Pulmonary Risk for Non-Thoracic Surgery:  ForMs. Brockman, moderate to high risk of perioperative pulmonary complications is increased by:  Diffusion Defect   Chronic O2  COPD /  Emphysema   Hx of Smoking  Obstructive sleep apnea  NYHA Class III Pulmonary Hypertension  Physically Deconditioned  Respiratory complications generally occur in 1% of ASA Class I patients, 5% of ASA Class II and 10% of ASA Class III-IV patients These complications rarely result in mortality and iclude postoperative pneumonia, atelectasis, pulmonary embolism, ARDS and increased time requiring postoperative mechanical ventilation.  Overall, I recommend proceeding with the surgery if the risk for respiratory complications are outweighed by the potential benefits. This will need to be discussed between the patient and surgeon.  To reduce risks of respiratory complications, I recommend: --Pre- and post-operative incentive spirometry performed frequently while awake --Inpatient use of currently prescribed positive-pressure for OSA whenever the patient is sleeping --Avoiding use of pancuronium during anesthesia. --Needs cards surg approval   I have discussed the risk factors and recommendations above with the patient.     Return in about 6 weeks (around 04/05/2020), or if symptoms worsen or fail to improve, for NEW PULMONOLOGIST IN 48mn SLOT.   BLauraine Rinne NP 02/23/2020   This appointment required 45 minutes of patient care (this includes precharting, chart review, review of results, face-to-face care, etc.).

## 2020-02-23 NOTE — Assessment & Plan Note (Signed)
  Peri-operative Assessment of Pulmonary Risk for Non-Thoracic Surgery:  ForMs. Daffern, moderate to high risk of perioperative pulmonary complications is increased by:  Diffusion Defect   Chronic O2  COPD / Emphysema   Hx of Smoking  Obstructive sleep apnea  NYHA Class III Pulmonary Hypertension  Physically Deconditioned  Respiratory complications generally occur in 1% of ASA Class I patients, 5% of ASA Class II and 10% of ASA Class III-IV patients These complications rarely result in mortality and iclude postoperative pneumonia, atelectasis, pulmonary embolism, ARDS and increased time requiring postoperative mechanical ventilation.  Overall, I recommend proceeding with the surgery if the risk for respiratory complications are outweighed by the potential benefits. This will need to be discussed between the patient and surgeon.  To reduce risks of respiratory complications, I recommend: --Pre- and post-operative incentive spirometry performed frequently while awake --Inpatient use of currently prescribed positive-pressure for OSA whenever the patient is sleeping --Avoiding use of pancuronium during anesthesia. --Needs cards surg approval   I have discussed the risk factors and recommendations above with the patient.

## 2020-02-23 NOTE — Assessment & Plan Note (Signed)
Plan: We will establish with new pulmonologist in our office Patient needs cardiovascular clearance

## 2020-02-24 NOTE — Telephone Encounter (Signed)
Can hold aspirin 5 days before.

## 2020-02-25 ENCOUNTER — Encounter: Payer: Self-pay | Admitting: Adult Health

## 2020-02-25 DIAGNOSIS — C50412 Malignant neoplasm of upper-outer quadrant of left female breast: Secondary | ICD-10-CM | POA: Insufficient documentation

## 2020-02-25 DIAGNOSIS — Z171 Estrogen receptor negative status [ER-]: Secondary | ICD-10-CM | POA: Insufficient documentation

## 2020-02-25 NOTE — Telephone Encounter (Signed)
   Primary Cardiologist: Kathlyn [place], MD  Chart reviewed and patient contacted by phone today as part of pre-operative protocol coverage. Given past medical history and time since last visit, based on ACC/AHA guidelines, Deborah [name] would be at acceptable risk for the planned procedure without further cardiovascular testing.   OK to hold aspirin 5 days pre op if needed..   I will route this recommendation to the requesting party via Epic fax function and remove from pre-op pool.  Please call with questions.  Kerin Ransom, PA-C 02/25/2020, 8:26 AM

## 2020-02-26 ENCOUNTER — Other Ambulatory Visit: Payer: Self-pay | Admitting: Surgery

## 2020-02-26 ENCOUNTER — Telehealth: Payer: Self-pay | Admitting: Hematology and Oncology

## 2020-02-26 DIAGNOSIS — C50912 Malignant neoplasm of unspecified site of left female breast: Secondary | ICD-10-CM

## 2020-02-26 NOTE — Telephone Encounter (Signed)
A new pt appt has been scheduled for Deborah [name] to see Dr. [name] on 8/23 at 345pm. [name] has been cld and made aware of the appt date and time.

## 2020-02-29 NOTE — Progress Notes (Signed)
Alpha 1 is mm and normal

## 2020-03-02 ENCOUNTER — Encounter: Payer: Self-pay | Admitting: Radiation Oncology

## 2020-03-02 ENCOUNTER — Other Ambulatory Visit: Payer: Self-pay

## 2020-03-02 ENCOUNTER — Telehealth: Payer: Self-pay

## 2020-03-02 NOTE — Telephone Encounter (Signed)
Left voicemail message to call back in regards to telephone appointment with Shona Simpson PA on 03/04/20 at 8:30am. Called to review meaningful use questions. TM

## 2020-03-02 NOTE — Telephone Encounter (Signed)
Spoke with patient in regards to telephone appointment on 03/04/20 at 8:30am. Patient verbalized understanding of appointment date and time. Meaningful use questions reviewed. TM

## 2020-03-04 ENCOUNTER — Ambulatory Visit
Admission: RE | Admit: 2020-03-04 | Discharge: 2020-03-04 | Disposition: A | Discharge status: Home / Self Care | Payer: BLUE CROSS/BLUE SHIELD | Source: Ambulatory Visit | Attending: Radiation Oncology | Admitting: Radiation Oncology

## 2020-03-04 ENCOUNTER — Other Ambulatory Visit: Payer: Self-pay

## 2020-03-04 DIAGNOSIS — C3490 Malignant neoplasm of unspecified part of unspecified bronchus or lung: Secondary | ICD-10-CM

## 2020-03-04 DIAGNOSIS — Z923 Personal history of irradiation: Secondary | ICD-10-CM | POA: Diagnosis not present

## 2020-03-04 DIAGNOSIS — C50412 Malignant neoplasm of upper-outer quadrant of left female breast: Secondary | ICD-10-CM | POA: Diagnosis not present

## 2020-03-04 DIAGNOSIS — Z85118 Personal history of other malignant neoplasm of bronchus and lung: Secondary | ICD-10-CM | POA: Diagnosis not present

## 2020-03-04 DIAGNOSIS — Z171 Estrogen receptor negative status [ER-]: Secondary | ICD-10-CM

## 2020-03-04 DIAGNOSIS — C3432 Malignant neoplasm of lower lobe, left bronchus or lung: Secondary | ICD-10-CM

## 2020-03-04 NOTE — Progress Notes (Signed)
Radiation Oncology         ([phone]) [phone] ________________________________  Outpatient Reconsultation - Conducted via telephone due to current COVID-19 concerns for limiting patient exposure  I spoke with the patient to conduct this consult visit via telephone to spare the patient unnecessary potential exposure in the healthcare setting during the current COVID-19 pandemic. The patient was notified in advance and was offered a [place] meeting to allow for face to face communication but unfortunately reported that they did not have the appropriate resources/technology to support such a visit and instead preferred to proceed with a telephone consult.    Name: Deborah [name]        MRN: [id number]  Date of Service: 03/04/2020 DOB: August 29, 1955  WU:[id number], Leticia Penna, NP  Alphonsa Overall, MD     REFERRING PHYSICIAN: Alphonsa Overall, MD   DIAGNOSIS: The primary encounter diagnosis was Malignant neoplasm of unspecified part of unspecified bronchus or lung ([place]). Diagnoses of Primary malignant neoplasm of bronchus of left lower lobe ([facility]) and Malignant neoplasm of upper-outer quadrant of left breast in female, estrogen receptor negative ([place]) were also pertinent to this visit.   HISTORY OF PRESENT ILLNESS: Deborah [name] is a 64 y.o. female with a new diagnosis of left breast cancer. The patient was noted to have a history of Stage IIIB, cT3N2M0 NSCLC,squamous cell carcinomaof the LLL. She was treated with chemoRT in the winter of 2019. She has been followed since and recent CT imaging revealed a mass in her left breast. Apparently it had increased in size since January 2021.Further diagnostic imaging revealed a 3.2 x 2.3 x .9 cm mass assocaited area of calcifications at 2:00 that was contiguous with a dilated duct overall measuring about 3.7 cm. Her axilla was negative for adenopathy. She underwent biopsy on 02/19/20 that revealed a grade 3, triple negative invasive ductal carcinoma with a Ki 67 of 40%. She  has met with cardiology and pulmonary and has clearance to proceed surgically. She's contacted by phone today to discuss her options of additional radiotherapy given her prior treatment.    PREVIOUS RADIATION THERAPY: Yes   05/13/2018 - 06/27/2018: The patient was treated to the disease within the [place] initially to a dose of 60 Gy in 30 fractionsusing a 36feld, 3-D conformal technique. The patient then received a cone down boost treatment for an additional 6 Gy delivered in 3 fractions. This yielded a final total dose of 66 Gy.   PAST MEDICAL HISTORY:  Past Medical History:  Diagnosis Date  . Anemia    as teen  . Asthma   . [place]estive heart failure) [place]   . COPD, mild [place]   . Depression   . Diverticulitis   . Family history of anesthesia complication    vomiting  . GI bleeding   . Heart failure ([place]    New onset 07/25/14  . Histoplasmosis    left eye  . Hyperkalemia   . Hypertension   . Lung mass 04/19/2018  . Obesity (BMI 30-39.9)   . Pneumonia    dx wtih pneumonia on 05/27/16- seen by [facility]   . PONV (postoperative nausea and vomiting)   . Restrictive lung disease   . Shortness of breath    with exertion   . Sleep apnea    mask and oxygen at nite for sleep at 2L   . Tobacco abuse   . Umbilical hernia        PAST SURGICAL HISTORY: Past Surgical History:  Procedure Laterality Date  . APPLICATION OF WOUND VAC  03/19/2013   Procedure: APPLICATION OF WOUND VAC;  Surgeon: Madilyn Hook, DO;  Location: [facility];  Service: General;;  . CESAREAN SECTION  04/12/85  . COLONOSCOPY WITH PROPOFOL N/A 06/13/2016   Procedure: COLONOSCOPY WITH PROPOFOL;  Surgeon: Jerene Bears, MD;  Location: [facility];  Service: Gastroenterology;  Laterality: N/A;  . ERCP N/A 02/15/2017   Procedure: ENDOSCOPIC RETROGRADE CHOLANGIOPANCREATOGRAPHY (ERCP);  Surgeon: [name], MD;  Location: [facility];  Service: Endoscopy;  Laterality: N/A;  . INSERTION OF MESH N/A 03/19/2013     Procedure: INSERTION OF MESH;  Surgeon: Madilyn Hook, DO;  Location: [facility];  Service: General;  Laterality: N/A;  . VENTRAL HERNIA REPAIR N/A 03/19/2013   Procedure:  OPEN VENTRAL HERNIA REPAIR WITH MESH AND APPLICATION OF WOUND VAC;  Surgeon: Madilyn Hook, DO;  Location: [facility];  Service: General;  Laterality: N/A;  . VIDEO BRONCHOSCOPY Bilateral 04/19/2018   Procedure: VIDEO BRONCHOSCOPY WITH FLUORO;  Surgeon: Rigoberto Noel, MD;  Location: [facility];  Service: Cardiopulmonary;  Laterality: Bilateral;     FAMILY HISTORY:  Family History  Problem Relation Age of Onset  . Heart attack Mother   . Emphysema Mother        was a smoker  . Congestive Heart Failure Father   . Bipolar disorder Father   . Lymphoma Maternal Uncle   . Pancreatic cancer Maternal Aunt   . Bipolar disorder Sister   . Schizophrenia Sister   . Other Sister        tumor in her neck  . Melanoma Sister   . Heart attack Maternal Uncle   . Heart attack Maternal Uncle   . Colon cancer Neg Hx   . Breast cancer Neg Hx      SOCIAL HISTORY:  reports that she quit smoking about 7 years ago. Her smoking use included cigarettes. She has a 129.00 pack-year smoking history. She has never used smokeless tobacco. She reports that she does not drink alcohol and does not use drugs. The patient is married and lives in [place].   ALLERGIES: Patient has no known allergies.   MEDICATIONS:  Current Outpatient Medications  Medication Sig Dispense Refill  . albuterol (VENTOLIN HFA) 108 (90 Base) MCG/ACT inhaler Inhale 2 puffs into the lungs every 6 (six) hours as needed for wheezing or shortness of breath. 8 g 3  . aspirin 81 MG chewable tablet Chew 1 tablet (81 mg total) by mouth daily. 30 tablet 1  . Budeson-Glycopyrrol-Formoterol (BREZTRI AEROSPHERE) 160-9-4.8 MCG/ACT AERO Inhale 2 puffs into the lungs daily. (Patient taking differently: Inhale 2 puffs into the lungs 2 (two) times daily. ) 5.9 g 0  . Cholecalciferol (D3  ADULT PO) Take 1,000 mg by mouth.    . ciclopirox (CICLODAN) 0.77 % cream Apply topically 2 (two) times daily. 30 g 0  . diltiazem (CARDIZEM CD) 240 MG 24 hr capsule TAKE 1 CAPSULE BY MOUTH EVERY DAY 90 capsule 2  . furosemide (LASIX) 20 MG tablet Take 2 tablets (40 mg total) by mouth daily. 180 tablet 1  . montelukast (SINGULAIR) 10 MG tablet Take 1 tablet (10 mg total) by mouth at bedtime. 90 tablet 2  . OXYGEN Inhale 4 L/min into the lungs continuous.     . potassium chloride SA (KLOR-CON M20) 20 MEQ tablet TAKE 1/2 TABLET BY MOUTH EVERY DAY 45 tablet 1  . predniSONE (DELTASONE) 10 MG tablet Take 1 tablet (10 mg  total) by mouth daily with breakfast. 20 tablet 0  . Spacer/Aero-Holding Chambers (AEROCHAMBER MV) inhaler Use as instructed 1 each 0  . vitamin B-12 (CYANOCOBALAMIN) 500 MCG tablet Take 500 mcg by mouth daily.     No current facility-administered medications for this encounter.     REVIEW OF SYSTEMS: On review of systems, the patient reports that she is doing well overall. She denies any chest pain, fevers, chills, night sweats, unintended weight changes. She continues with 4L O2 via nasal cannula which has helped. She reports her breathing has worsened overall however despite her O2 demands staying stable. She denies any bowel or bladder disturbances, and denies abdominal pain, nausea or vomiting. She denies any new musculoskeletal or joint aches or pains. A complete review of systems is obtained and is otherwise negative.     PHYSICAL EXAM:  Wt Readings from Last 3 Encounters:  02/23/20 256 lb 3.2 oz (116.2 kg)  01/28/20 255 lb (115.7 kg)  01/14/20 258 lb (117 kg)   Unable to assess due to encounter type.    ECOG = 1  0 - Asymptomatic (Fully active, able to carry on all predisease activities without restriction)  1 - Symptomatic but completely ambulatory (Restricted in physically strenuous activity but ambulatory and able to carry out work of a light or sedentary  nature. For example, light housework, office work)  2 - Symptomatic, <50% in bed during the day (Ambulatory and capable of all self care but unable to carry out any work activities. Up and about more than 50% of waking hours)  3 - Symptomatic, >50% in bed, but not bedbound (Capable of only limited self-care, confined to bed or chair 50% or more of waking hours)  4 - Bedbound (Completely disabled. Cannot carry on any self-care. Totally confined to bed or chair)  5 - Death   Eustace Pen MM, Creech RH, Tormey DC, et al. [phone]). "Toxicity and response criteria of the [facility]". [place] Oncol. 5 (6): 649-55    LABORATORY DATA:  Lab Results  Component Value Date   WBC 10.0 01/26/2020   HGB 12.6 01/26/2020   HCT 41.7 01/26/2020   MCV 92.1 01/26/2020   PLT 280 01/26/2020   Lab Results  Component Value Date   NA 143 01/26/2020   K 3.9 01/26/2020   CL 105 01/26/2020   CO2 27 01/26/2020   Lab Results  Component Value Date   ALT 39 01/26/2020   AST 22 01/26/2020   ALKPHOS 88 01/26/2020   BILITOT <0.2 (L) 01/26/2020      RADIOGRAPHY: US BREAST LTD UNI LEFT INC AXILLA  Result Date: 02/12/2020 CLINICAL DATA:  64 year old patient with known lung cancer recently had a chest CT, and a possible left breast mass was identified on the CT. She presents for evaluation. EXAM: DIGITAL DIAGNOSTIC BILATERAL MAMMOGRAM WITH CAD AND TOMO ULTRASOUND LEFT BREAST COMPARISON:  Recent chest CT January 26, 2020 ACR Breast Density Category b: There are scattered areas of fibroglandular density. FINDINGS: Images performed with the patient in a wheelchair. Some posterior tissue is excluded due to decreased mobility. There are scattered circumscribed low-density nodules bilaterally, a benign finding. In the upper-outer quadrant of the left breast is a dense irregular mass with associated pleomorphic microcalcifications. Mass in suspicious microcalcifications span 3.2 x 2.3 x 0.9 cm. No findings  to suggest malignancy in the right breast. Mammographic images were processed with CAD. Targeted ultrasound is performed, showing a hypoechoic irregular mass with internal calcifications  and prominent vascular flow. Contiguous with the mass is a dilated duct with internal echogenic soft tissue and internal calcifications. Findings are at 2 o'clock position 9 cm from the nipple and in total measure 3.7 x 1.1 x 1.9 cm. Ultrasound the left axilla is negative for lymphadenopathy. IMPRESSION: Findings highly suspicious for malignancy in the left breast, likely including and invasive and in situ component. No evidence of malignancy in the right breast. RECOMMENDATION: Ultrasound-guided core needle biopsy of the left breast mass is recommended and has been scheduled for the patient I have discussed the findings and recommendations with the patient. If applicable, a reminder letter will be sent to the patient regarding the next appointment. BI-RADS CATEGORY  5: Highly suggestive of malignancy. Electronically Signed   By: Curlene Dolphin M.D.   On: 02/12/2020 16:35   MM DIAG BREAST TOMO BILATERAL  Result Date: 02/12/2020 CLINICAL DATA:  64 year old patient with known lung cancer recently had a chest CT, and a possible left breast mass was identified on the CT. She presents for evaluation. EXAM: DIGITAL DIAGNOSTIC BILATERAL MAMMOGRAM WITH CAD AND TOMO ULTRASOUND LEFT BREAST COMPARISON:  Recent chest CT January 26, 2020 ACR Breast Density Category b: There are scattered areas of fibroglandular density. FINDINGS: Images performed with the patient in a wheelchair. Some posterior tissue is excluded due to decreased mobility. There are scattered circumscribed low-density nodules bilaterally, a benign finding. In the upper-outer quadrant of the left breast is a dense irregular mass with associated pleomorphic microcalcifications. Mass in suspicious microcalcifications span 3.2 x 2.3 x 0.9 cm. No findings to suggest malignancy in the  right breast. Mammographic images were processed with CAD. Targeted ultrasound is performed, showing a hypoechoic irregular mass with internal calcifications and prominent vascular flow. Contiguous with the mass is a dilated duct with internal echogenic soft tissue and internal calcifications. Findings are at 2 o'clock position 9 cm from the nipple and in total measure 3.7 x 1.1 x 1.9 cm. Ultrasound the left axilla is negative for lymphadenopathy. IMPRESSION: Findings highly suspicious for malignancy in the left breast, likely including and invasive and in situ component. No evidence of malignancy in the right breast. RECOMMENDATION: Ultrasound-guided core needle biopsy of the left breast mass is recommended and has been scheduled for the patient I have discussed the findings and recommendations with the patient. If applicable, a reminder letter will be sent to the patient regarding the next appointment. BI-RADS CATEGORY  5: Highly suggestive of malignancy. Electronically Signed   By: Curlene Dolphin M.D.   On: 02/12/2020 16:35   MM CLIP PLACEMENT LEFT  Result Date: 02/19/2020 CLINICAL DATA:  Confirmation of clip placement after ultrasound-guided core needle biopsy of a highly suspicious mass associated with calcifications involving the UPPER OUTER QUADRANT the LEFT breast. EXAM: 2D AND TOMOSYNTHESIS DIAGNOSTIC LEFT MAMMOGRAM POST ULTRASOUND BIOPSY COMPARISON:  Previous exam(s). FINDINGS: Tomosynthesis and synthesized full field CC and mediolateral images were obtained following ultrasound guided biopsy of a highly suspicious mass associated with calcifications in the UPPER OUTER QUADRANT of the LEFT breast. The ribbon shaped tissue marking clip is appropriately position within the biopsied mass in the [place] at MIDDLE depth. Expected post biopsy changes are present without evidence of hematoma. IMPRESSION: Appropriate positioning of the ribbon shaped tissue marking clip within the biopsied mass in  the [place] of the LEFT breast. Final Assessment: Post Procedure Mammograms for Marker Placement Electronically Signed   By: Evangeline Dakin M.D.   On: 02/19/2020 08:29  [place] LT BREAST BX W LOC DEV 1ST LESION IMG BX SPEC US GUIDE  Addendum Date: 02/23/2020   ADDENDUM REPORT: 02/20/2020 11:16 ADDENDUM: Pathology revealed GRADE III INVASIVE DUCTAL CARCINOMA of the Left breast, upper outer quadrant 2 o'clock, 9cmfn. This was found to be concordant by Dr. Peggye Fothergill. Pathology results were discussed with the patient by telephone. The patient reported doing well after the biopsy with tenderness at the site. Post biopsy instructions and care were reviewed and questions were answered. The patient was encouraged to call [facility] for any additional concerns. My direct phone number was provided. Surgical consultation has been arranged with Dr. Alphonsa Overall at [facility] on February 23, 2020. Dr. Curt Bears at [facility] was notified of biopsy results via EPIC message on February 20, 2020. Pathology results reported by Terie Purser, RN on 02/20/2020. Electronically Signed   By: Evangeline Dakin M.D.   On: 02/20/2020 11:16   Result Date: 02/23/2020 CLINICAL DATA:  64 year old who is currently undergoing treatment for lung cancer, shown on the CT chest to have a LEFT breast mass, confirmed on recent diagnostic workup to have a highly suspicious 3.7 cm mass associated with calcifications in the UPPER OUTER QUADRANT of the LEFT breast at the 2 o'clock position approximately 9 cm from the nipple. EXAM: ULTRASOUND GUIDED LEFT BREAST CORE NEEDLE BIOPSY COMPARISON:  Previous exam(s). PROCEDURE: I met with the patient and we discussed the procedure of ultrasound-guided biopsy, including benefits and alternatives. We discussed the high likelihood of a successful procedure. We discussed the risks of the procedure, including infection, bleeding, tissue injury,  clip migration, and inadequate sampling. Informed written consent was given. The usual time-out protocol was performed immediately prior to the procedure. Lesion quadrant: UPPER OUTER QUADRANT. Using sterile technique with chlorhexidine as skin antisepsis, 1% lidocaine and 1% lidocaine with epinephrine as local anesthetic, under direct ultrasound visualization, a 12 gauge Bard Marquee core needle device placed through an 11 gauge introducer needle was used to perform biopsy of the mass and associated calcifications in the UPPER OUTER QUADRANT of the LEFT breast using a lateral approach. At the conclusion of the procedure a ribbon shaped tissue marker clip was deployed into the biopsy cavity. Follow up 2 view mammogram was performed and dictated separately. IMPRESSION: Ultrasound guided biopsy of a highly suspicious mass associated with calcifications in the UPPER OUTER QUADRANT of the LEFT breast. No apparent complications. Electronically Signed: By: Evangeline Dakin M.D. On: 02/19/2020 08:29       IMPRESSION/PLAN: 1. Stage IIB, cT2N0M0, grade 3, triple negative invasive ductal carcinoma of the left breast. Dr. Lisbeth Renshaw discusses the pathology findings and reviews the nature of triple negative left breast disease. The consensus from the breast conference includes neoadjuvant chemotherapy followed by surgical resection. If she were to proceed with breast conservation, she would benefit from adjuvant radiotherapy We discussed the risks, benefits, short, and long term effects of radiotherapy. Dr. Lisbeth Renshaw discusses the delivery and logistics of radiotherapy and anticipates a course of 4-6 1/2 weeks of radiotherapy. We will see her back about 2 weeks after surgery to discuss the simulation process and anticipate we starting radiotherapy about 4-6 weeks after surgery. She is leaning toward mastectomy, so we will follow along with her course. 2. Stage IIIB, cT3N2M0 NSCLC,squamous cell carcinomaof the LLL. The patient  has been doing well since completing treatment and will see Dr. Julien Nordmann moving forward in October for continued surveillance. 3. Possible genetic  predisposition to malignancy. The patient is a candidate for genetic testing given her personal and family history. She was offered referral and will be referred. .   Given current concerns for patient exposure during the COVID-19 pandemic, this encounter was conducted via telephone.  The patient has provided two factor identification and has given verbal consent for this type of encounter and has been advised to only accept a meeting of this type in a secure network environment. The time spent during this encounter was 45 minutes including preparation, discussion, and coordination of the patient's care. The attendants for this meeting include Dr. Lisbeth Renshaw, Hayden Pedro  and [name].  During the encounter,  Dr. Lisbeth Renshaw, and Hayden Pedro were located at [facility] Radiation Oncology Department.  [name] was located at home.   The above documentation reflects my direct findings during this shared patient visit. Please see the separate note by Dr. Lisbeth Renshaw on this date for the remainder of the patient's plan of care.    Carola Rhine, PAC

## 2020-03-07 NOTE — Progress Notes (Signed)
[place] CONSULT NOTE  Patient Care Team: Pleas Koch, NP as PCP - General (Internal Medicine) [name], MD as PCP - Cardiology (Cardiology) [name], MD (Inactive) (Family Medicine) [name], MD as Consulting Physician (Cardiology) Rockwell Germany, RN as Oncology Nurse Navigator Mauro Kaufmann, RN as Oncology Nurse Navigator  CHIEF COMPLAINTS/PURPOSE OF CONSULTATION:  Newly diagnosed breast cancer  HISTORY OF PRESENTING ILLNESS:  Deborah [name] 64 y.o. female is here because of recent diagnosis of triple negative invasive ductal carcinoma of the left breast. Chest CT for lung cancer follow-up on 01/26/20 showed a left breast module. Mammogram and [place] on 02/12/20 showed a 3.7cm mass with calcifications at the 2 o'clock position in the left breast with no left axillary adenopathy. Biopsy on 02/19/20 showed invasive ductal carcinoma, grade 3, HER-2 negative (0), ER/PR negative, Ki67 40%. She has a history of non-small cell left-sided lung cancer  diagnosed in 03/2018 and treated with chemoradiation with Taxol and carboplatin followed by Imfinzi immunotherapy, and was under the care of Dr. Julien Nordmann. She presents to the clinic today for initial evaluation and to discuss treatment options.   I reviewed her records extensively and collaborated the history with the patient.  SUMMARY OF ONCOLOGIC HISTORY: Oncology History  Primary malignant neoplasm of bronchus of left lower lobe ([place])  05/02/2018 Initial Diagnosis   Stage III squamous cell carcinoma of left lung ([place])   05/13/2018 - 06/30/2018 Chemotherapy   The patient had palonosetron (ALOXI) injection 0.25 mg, 0.25 mg, Intravenous,  Once, 7 of 7 cycles Administration: 0.25 mg (05/13/2018), 0.25 mg (06/10/2018), 0.25 mg (06/17/2018), 0.25 mg (05/20/2018), 0.25 mg (06/24/2018), 0.25 mg (05/27/2018), 0.25 mg (06/03/2018) CARBOplatin (PARAPLATIN) 300 mg in sodium chloride 0.9 % 250 mL chemo infusion, 300  mg (100 % of original dose 300 mg), Intravenous,  Once, 7 of 7 cycles Dose modification: 300 mg (original dose 300 mg, Cycle 1) Administration: 300 mg (05/13/2018), 300 mg (06/10/2018), 260 mg (06/17/2018), 300 mg (05/20/2018), 300 mg (06/24/2018), 300 mg (05/27/2018), 300 mg (06/03/2018) PACLitaxel (TAXOL) 96 mg in sodium chloride 0.9 % 250 mL chemo infusion (</= 41m/m2), 45 mg/m2 = 96 mg, Intravenous,  Once, 7 of 7 cycles Administration: 96 mg (05/13/2018), 96 mg (06/10/2018), 96 mg (06/17/2018), 96 mg (05/20/2018), 96 mg (06/24/2018), 96 mg (05/27/2018), 96 mg (06/03/2018)  for chemotherapy treatment.    07/31/2018 -  Chemotherapy   The patient had durvalumab (IMFINZI) 1,000 mg in sodium chloride 0.9 % 100 mL chemo infusion, 1,020 mg, Intravenous,  Once, 26 of 26 cycles Administration: 1,000 mg (07/31/2018), 1,000 mg (10/22/2018), 1,000 mg (11/05/2018), 1,000 mg (11/19/2018), 1,000 mg (12/03/2018), 1,000 mg (08/14/2018), 1,000 mg (12/17/2018), 1,000 mg (08/27/2018), 1,000 mg (09/10/2018), 1,000 mg (09/24/2018), 1,000 mg (10/11/2018), 1,000 mg (12/31/2018), 1,000 mg (01/14/2019), 1,120 mg (01/28/2019), 1,120 mg (02/11/2019), 1,120 mg (02/25/2019), 1,120 mg (03/11/2019), 1,120 mg (03/25/2019), 1,120 mg (04/08/2019), 1,120 mg (04/22/2019), 1,120 mg (05/06/2019), 1,120 mg (05/20/2019), 1,120 mg (06/03/2019), 1,120 mg (06/17/2019), 1,120 mg (07/01/2019), 1,120 mg (07/15/2019)  for chemotherapy treatment.    Malignant neoplasm of upper-outer quadrant of left breast in female, estrogen receptor negative [place]  02/19/2020 Cancer Staging   Staging form: Breast, AJCC 8th Edition - Clinical stage from 02/19/2020: Stage IIB (cT2, cN0, cM0, G3, ER-, PR-, HER2-) - Signed by CGardenia Phlegm NP on 02/25/2020   02/19/2020 Initial Diagnosis   Chest CT for lung cancer follow-up showed a left breast module. Mammogram and [place]  [place] 3.7cm mass with calcifications at the 2 o'clock position in the left breast, no left axillary adenopathy. Biopsy  showed IDC, grade 3, HER-2 negative (0), ER/PR negative, Ki67 40%.      MEDICAL HISTORY:  Past Medical History:  Diagnosis Date  . Anemia    as teen  . Asthma   . CHF (congestive heart failure) ([place])   . COPD, mild ([place])   . Depression   . Diverticulitis   . Family history of anesthesia complication    vomiting  . GI bleeding   . Heart failure ([place])    New onset 07/25/14  . Histoplasmosis    left eye  . Hyperkalemia   . Hypertension   . Lung mass 04/19/2018  . Obesity (BMI 30-39.9)   . Pneumonia    dx wtih pneumonia on 05/27/16- seen by [facility]   . PONV (postoperative nausea and vomiting)   . Restrictive lung disease   . Shortness of breath    with exertion   . Sleep apnea    mask and oxygen at nite for sleep at 2L   . Tobacco abuse   . Umbilical hernia     SURGICAL HISTORY: Past Surgical History:  Procedure Laterality Date  . APPLICATION OF WOUND VAC  03/19/2013   Procedure: APPLICATION OF WOUND VAC;  Surgeon: Madilyn Hook, DO;  Location: [facility];  Service: General;;  . CESAREAN SECTION  04/12/85  . COLONOSCOPY WITH PROPOFOL N/A 06/13/2016   Procedure: COLONOSCOPY WITH PROPOFOL;  Surgeon: Jerene Bears, MD;  Location: [facility];  Service: Gastroenterology;  Laterality: N/A;  . ERCP N/A 02/15/2017   Procedure: ENDOSCOPIC RETROGRADE CHOLANGIOPANCREATOGRAPHY (ERCP);  Surgeon: [name], MD;  Location: [facility];  Service: Endoscopy;  Laterality: N/A;  . INSERTION OF MESH N/A 03/19/2013   Procedure: INSERTION OF MESH;  Surgeon: Madilyn Hook, DO;  Location: [facility];  Service: General;  Laterality: N/A;  . VENTRAL HERNIA REPAIR N/A 03/19/2013   Procedure:  OPEN VENTRAL HERNIA REPAIR WITH MESH AND APPLICATION OF WOUND VAC;  Surgeon: Madilyn Hook, DO;  Location: [facility];  Service: General;  Laterality: N/A;  . VIDEO BRONCHOSCOPY Bilateral 04/19/2018   Procedure: VIDEO BRONCHOSCOPY WITH FLUORO;  Surgeon: Rigoberto Noel, MD;  Location: [facility];  Service: Cardiopulmonary;   Laterality: Bilateral;    SOCIAL HISTORY: Social History   Socioeconomic History  . Marital status: Married    Spouse name: Not on file  . Number of children: 1  . Years of education: Not on file  . Highest education level: Not on file  Occupational History  . Occupation: Retired    Fish farm manager: NOT EMPLOYED  Tobacco Use  . Smoking status: Former Smoker    Packs/day: 3.00    Years: 43.00    Pack years: 129.00    Types: Cigarettes    Quit date: 12/17/2012    Years since quitting: 7.2  . Smokeless tobacco: Never Used  Vaping Use  . Vaping Use: Former  Substance and Sexual Activity  . Alcohol use: No    Alcohol/week: 0.0 standard drinks  . Drug use: No  . Sexual activity: Not on file  Other Topics Concern  . Not on file  Social History Narrative   ** Merged History Encounter **       ** Data from: 07/25/14 Enc Dept: [facility]       ** Data from: 06/05/13 Enc Dept: [facility]   Married, one son 59 yo.  Social Determinants of Health   Financial Resource Strain:   . Difficulty of Paying Living Expenses: Not on file  Food Insecurity:   . Worried About Charity fundraiser in the Last Year: Not on file  . Ran Out of Food in the Last Year: Not on file  Transportation Needs:   . Lack of Transportation (Medical): Not on file  . Lack of Transportation (Non-Medical): Not on file  Physical Activity:   . Days of Exercise per Week: Not on file  . Minutes of Exercise per Session: Not on file  Stress:   . Feeling of Stress : Not on file  Social Connections:   . Frequency of Communication with Friends and Family: Not on file  . Frequency of Social Gatherings with Friends and Family: Not on file  . Attends Religious Services: Not on file  . Active Member of Clubs or Organizations: Not on file  . Attends Archivist Meetings: Not on file  . Marital Status: Not on file  Intimate Partner Violence:   . Fear of Current or Ex-Partner: Not on file    . Emotionally Abused: Not on file  . Physically Abused: Not on file  . Sexually Abused: Not on file    FAMILY HISTORY: Family History  Problem Relation Age of Onset  . Heart attack Mother   . Emphysema Mother        was a smoker  . Congestive Heart Failure Father   . Bipolar disorder Father   . Lymphoma Maternal Uncle   . Pancreatic cancer Maternal Aunt   . Bipolar disorder Sister   . Schizophrenia Sister   . Other Sister        tumor in her neck  . Melanoma Sister   . Heart attack Maternal Uncle   . Heart attack Maternal Uncle   . Colon cancer Neg Hx   . Breast cancer Neg Hx     ALLERGIES:  has No Known Allergies.  MEDICATIONS:  Current Outpatient Medications  Medication Sig Dispense Refill  . albuterol (VENTOLIN HFA) 108 (90 Base) MCG/ACT inhaler Inhale 2 puffs into the lungs every 6 (six) hours as needed for wheezing or shortness of breath. 8 g 3  . aspirin 81 MG chewable tablet Chew 1 tablet (81 mg total) by mouth daily. 30 tablet 1  . Budeson-Glycopyrrol-Formoterol (BREZTRI AEROSPHERE) 160-9-4.8 MCG/ACT AERO Inhale 2 puffs into the lungs daily. (Patient taking differently: Inhale 2 puffs into the lungs 2 (two) times daily. ) 5.9 g 0  . Cholecalciferol (D3 ADULT PO) Take 1,000 mg by mouth.    . ciclopirox (CICLODAN) 0.77 % cream Apply topically 2 (two) times daily. 30 g 0  . diltiazem (CARDIZEM CD) 240 MG 24 hr capsule TAKE 1 CAPSULE BY MOUTH EVERY DAY 90 capsule 2  . furosemide (LASIX) 20 MG tablet Take 2 tablets (40 mg total) by mouth daily. 180 tablet 1  . montelukast (SINGULAIR) 10 MG tablet Take 1 tablet (10 mg total) by mouth at bedtime. 90 tablet 2  . OXYGEN Inhale 4 L/min into the lungs continuous.     . potassium chloride SA (KLOR-CON M20) 20 MEQ tablet TAKE 1/2 TABLET BY MOUTH EVERY DAY 45 tablet 1  . predniSONE (DELTASONE) 10 MG tablet Take 1 tablet (10 mg total) by mouth daily with breakfast. 20 tablet 0  . Spacer/Aero-Holding Chambers (AEROCHAMBER MV)  inhaler Use as instructed 1 each 0  . vitamin B-12 (CYANOCOBALAMIN) 500 MCG tablet Take 500 mcg  by mouth daily.     No current facility-administered medications for this visit.    REVIEW OF SYSTEMS:   Constitutional: Denies fevers, chills or abnormal night sweats Eyes: Denies blurriness of vision, double vision or watery eyes Ears, nose, mouth, throat, and face: Denies mucositis or sore throat Respiratory: Denies cough, dyspnea or wheezes Cardiovascular: Denies palpitation, chest discomfort or lower extremity swelling Gastrointestinal:  Denies nausea, heartburn or change in bowel habits Skin: Denies abnormal skin rashes Lymphatics: Denies new lymphadenopathy or easy bruising Neurological:Denies numbness, tingling or new weaknesses Behavioral/Psych: Mood is stable, no new changes  Breast: Denies any palpable lumps or discharge All other systems were reviewed with the patient and are negative.  PHYSICAL EXAMINATION: ECOG PERFORMANCE STATUS: 1 - Symptomatic but completely ambulatory  Vitals:   03/08/20 1554  BP: 125/71  Pulse: (!) 113  Resp: 17  Temp: 98.8 F (37.1 C)  SpO2: 97%   Filed Weights    GENERAL:alert, no distress and comfortable SKIN: skin color, texture, turgor are normal, no rashes or significant lesions EYES: normal, conjunctiva are pink and non-injected, sclera clear OROPHARYNX:no exudate, no erythema and lips, buccal mucosa, and tongue normal  NECK: supple, thyroid normal size, non-tender, without nodularity LYMPH:  no palpable lymphadenopathy in the cervical, axillary or inguinal LUNGS: clear to auscultation and percussion with normal breathing effort HEART: regular rate & rhythm and no murmurs and no lower extremity edema ABDOMEN:abdomen soft, non-tender and normal bowel sounds Musculoskeletal:no cyanosis of digits and no clubbing  PSYCH: alert & oriented x 3 with fluent speech NEURO: no focal motor/sensory deficits BREAST: No palpable nodules in breast.  No palpable axillary or supraclavicular lymphadenopathy (exam performed in the presence of a chaperone)   LABORATORY DATA:  I have reviewed the data as listed Lab Results  Component Value Date   WBC 10.0 01/26/2020   HGB 12.6 01/26/2020   HCT 41.7 01/26/2020   MCV 92.1 01/26/2020   PLT 280 01/26/2020   Lab Results  Component Value Date   NA 143 01/26/2020   K 3.9 01/26/2020   CL 105 01/26/2020   CO2 27 01/26/2020    RADIOGRAPHIC STUDIES: I have personally reviewed the radiological reports and agreed with the findings in the report.  ASSESSMENT AND PLAN:  Primary malignant neoplasm of bronchus of left lower lobe ([place]) 02/19/2020:Chest CT for lung cancer follow-up showed a left breast module. Mammogram and US showed a 3.7cm mass with calcifications at the 2 o'clock position in the left breast, no left axillary adenopathy. Biopsy showed IDC, grade 3, HER-2 negative (0), ER/PR negative, Ki67 40%.  Pathology and radiology counseling: Discussed with the patient, the details of pathology including the type of breast cancer,the clinical staging, the significance of ER, PR and HER-2/neu receptors and the implications for treatment. After reviewing the pathology in detail, we proceeded to discuss the different treatment options between surgery, radiation, chemotherapy, antiestrogen therapies.  Recommendation based on multidisciplinary tumor board: 1. Breast conserving surgery with sentinel lymph node study 2. Adjuvant chemotherapy with Adriamycin and Cytoxan dose dense 4 followed by Taxol weekly 12 3. Followed by adjuvant radiation therapy  Chemotherapy Counseling: I discussed the risks and benefits of chemotherapy including the risks of nausea/ vomiting, risk of infection from low WBC count, fatigue due to chemo or anemia, bruising or bleeding due to low platelets, mouth sores, loss/ change in taste and decreased appetite. Liver and kidney function will be monitored through out chemotherapy  as abnormalities in liver and kidney function  may be a side effect of treatment.  Peripheral neuropathy due to Taxol and cardiac dysfunction due to Adriamycin was discussed in detail. Risk of permanent bone marrow dysfunction due to chemo were also discussed.  If she cannot tolerate the above chemotherapy then we will consider switching her to CMF. Patient has 24-hour oxygen and uses a wheelchair for ambulation. I am worried about her tolerability to chemotherapy. However the patient wants to be extremely aggressive and wants to try the standard of care and if she cannot tolerate it then she is willing to consider a less aggressive option.  Plan: 1. Port placement to be done along with her breast surgery 2. Echocardiogram: Already done on 01/27/2020: EF greater than 55% 3. Chemotherapy class 4. Breast MRI Genetic counseling will also be arranged  Return to clinic after surgery to discuss starting chemotherapy. Resident message to Dr. [name] to see when surgery can be arranged.  She is anxious and wants to get the surgery done as soon as possible.    All questions were answered. The patient knows to call the clinic with any problems, questions or concerns.   Rulon Eisenmenger, MD, MPH 03/08/2020    I, Molly Dorshimer, am acting as scribe for Nicholas Lose, MD.  I have reviewed the above documentation for accuracy and completeness, and I agree with the above.

## 2020-03-08 ENCOUNTER — Other Ambulatory Visit: Payer: Self-pay

## 2020-03-08 ENCOUNTER — Inpatient Hospital Stay: Payer: BLUE CROSS/BLUE SHIELD | Attending: Internal Medicine | Admitting: Hematology and Oncology

## 2020-03-08 DIAGNOSIS — Z87891 Personal history of nicotine dependence: Secondary | ICD-10-CM | POA: Diagnosis not present

## 2020-03-08 DIAGNOSIS — Z803 Family history of malignant neoplasm of breast: Secondary | ICD-10-CM | POA: Insufficient documentation

## 2020-03-08 DIAGNOSIS — C50412 Malignant neoplasm of upper-outer quadrant of left female breast: Secondary | ICD-10-CM | POA: Insufficient documentation

## 2020-03-08 DIAGNOSIS — C3432 Malignant neoplasm of lower lobe, left bronchus or lung: Secondary | ICD-10-CM

## 2020-03-08 DIAGNOSIS — Z171 Estrogen receptor negative status [ER-]: Secondary | ICD-10-CM | POA: Insufficient documentation

## 2020-03-08 NOTE — Assessment & Plan Note (Signed)
02/19/2020:Chest CT for lung cancer follow-up showed a left breast module. Mammogram and US showed a 3.7cm mass with calcifications at the 2 o'clock position in the left breast, no left axillary adenopathy. Biopsy showed IDC, grade 3, HER-2 negative (0), ER/PR negative, Ki67 40%.  Pathology and radiology counseling: Discussed with the patient, the details of pathology including the type of breast cancer,the clinical staging, the significance of ER, PR and HER-2/neu receptors and the implications for treatment. After reviewing the pathology in detail, we proceeded to discuss the different treatment options between surgery, radiation, chemotherapy, antiestrogen therapies.  Recommendation based on multidisciplinary tumor board: 1. Neoadjuvant chemotherapy with Adriamycin and Cytoxan dose dense 4 followed by Taxol weekly 12 with carboplatin every 3 weeks x4 2. Followed by breast conserving surgery with sentinel lymph node study vs targeted axillary dissection 3. Followed by adjuvant radiation therapy  Chemotherapy Counseling: I discussed the risks and benefits of chemotherapy including the risks of nausea/ vomiting, risk of infection from low WBC count, fatigue due to chemo or anemia, bruising or bleeding due to low platelets, mouth sores, loss/ change in taste and decreased appetite. Liver and kidney function will be monitored through out chemotherapy as abnormalities in liver and kidney function may be a side effect of treatment.  Peripheral neuropathy due to Taxol and cardiac dysfunction due to Adriamycin was discussed in detail. Risk of permanent bone marrow dysfunction due to chemo were also discussed.  Plan: 1. Port placement to be done next Monday 2. Echocardiogram 3. Chemotherapy class 4. Breast MRI 5. CT chest abdomen pelvis and bone scan for staging Genetic counseling will also be arranged    Return to clinic in 1 week to start chemotherapy.

## 2020-03-09 ENCOUNTER — Encounter: Payer: Self-pay | Admitting: Hematology and Oncology

## 2020-03-09 ENCOUNTER — Telehealth: Payer: Self-pay | Admitting: Oncology

## 2020-03-09 ENCOUNTER — Telehealth: Payer: Self-pay | Admitting: Hematology and Oncology

## 2020-03-09 ENCOUNTER — Encounter: Payer: Self-pay | Admitting: *Deleted

## 2020-03-09 NOTE — Telephone Encounter (Signed)
No 8/23 los. No changes made to pt's schedule.

## 2020-03-09 NOTE — Telephone Encounter (Signed)
Note documented 03/02/20 TM

## 2020-03-10 ENCOUNTER — Telehealth (HOSPITAL_COMMUNITY): Payer: Self-pay

## 2020-03-10 ENCOUNTER — Other Ambulatory Visit (HOSPITAL_COMMUNITY): Payer: Self-pay | Admitting: Surgery

## 2020-03-10 DIAGNOSIS — C50912 Malignant neoplasm of unspecified site of left female breast: Secondary | ICD-10-CM

## 2020-03-10 NOTE — Telephone Encounter (Signed)
Called to schedule port placement. Pt isn't sure when she will start chemo, she just spoke with the doc on Monday. She is scheduled to have her MRI tomorrow and will discuss with doc after that. She will call me back so that we can schedule once this is done. AW

## 2020-03-11 ENCOUNTER — Ambulatory Visit
Admission: RE | Admit: 2020-03-11 | Discharge: 2020-03-11 | Disposition: A | Discharge status: Home / Self Care | Payer: BLUE CROSS/BLUE SHIELD | Source: Ambulatory Visit | Attending: Surgery | Admitting: Surgery

## 2020-03-11 DIAGNOSIS — C50912 Malignant neoplasm of unspecified site of left female breast: Secondary | ICD-10-CM

## 2020-03-12 ENCOUNTER — Encounter: Payer: Self-pay | Admitting: *Deleted

## 2020-03-14 ENCOUNTER — Other Ambulatory Visit: Payer: Self-pay | Admitting: Surgery

## 2020-03-14 DIAGNOSIS — Z171 Estrogen receptor negative status [ER-]: Secondary | ICD-10-CM

## 2020-03-14 DIAGNOSIS — C50912 Malignant neoplasm of unspecified site of left female breast: Secondary | ICD-10-CM

## 2020-03-16 ENCOUNTER — Encounter: Payer: Self-pay | Admitting: *Deleted

## 2020-03-16 NOTE — Progress Notes (Signed)
Called pt to provide navigation resources and contact information. Pt denies questions or concerns regarding dx or treatment care plan. Encourage pt to call with needs. Received verbal understanding.

## 2020-03-23 ENCOUNTER — Other Ambulatory Visit: Payer: Self-pay | Admitting: Surgery

## 2020-03-23 DIAGNOSIS — C50912 Malignant neoplasm of unspecified site of left female breast: Secondary | ICD-10-CM

## 2020-03-25 ENCOUNTER — Encounter: Payer: Self-pay | Admitting: *Deleted

## 2020-03-25 ENCOUNTER — Other Ambulatory Visit: Payer: Self-pay | Admitting: Student

## 2020-03-25 ENCOUNTER — Other Ambulatory Visit: Payer: Self-pay | Admitting: Physician Assistant

## 2020-03-25 NOTE — Addendum Note (Signed)
Addended by: Candiss Norse A on: 03/25/2020 11:34 AM   Modules accepted: Orders

## 2020-03-26 ENCOUNTER — Other Ambulatory Visit: Payer: BLUE CROSS/BLUE SHIELD

## 2020-03-26 ENCOUNTER — Telehealth: Payer: Self-pay | Admitting: Hematology and Oncology

## 2020-03-26 ENCOUNTER — Encounter: Payer: Self-pay | Admitting: Interventional Radiology

## 2020-03-26 ENCOUNTER — Other Ambulatory Visit: Payer: Self-pay

## 2020-03-26 ENCOUNTER — Ambulatory Visit (HOSPITAL_COMMUNITY)
Admission: RE | Admit: 2020-03-26 | Discharge: 2020-03-26 | Disposition: A | Discharge status: Home / Self Care | Payer: BLUE CROSS/BLUE SHIELD | Source: Ambulatory Visit | Attending: Surgery | Admitting: Surgery

## 2020-03-26 DIAGNOSIS — I110 Hypertensive heart disease with heart failure: Secondary | ICD-10-CM | POA: Diagnosis not present

## 2020-03-26 DIAGNOSIS — F329 Major depressive disorder, single episode, unspecified: Secondary | ICD-10-CM | POA: Insufficient documentation

## 2020-03-26 DIAGNOSIS — Z6841 Body Mass Index (BMI) 40.0 and over, adult: Secondary | ICD-10-CM | POA: Diagnosis not present

## 2020-03-26 DIAGNOSIS — Z87891 Personal history of nicotine dependence: Secondary | ICD-10-CM | POA: Insufficient documentation

## 2020-03-26 DIAGNOSIS — E669 Obesity, unspecified: Secondary | ICD-10-CM | POA: Insufficient documentation

## 2020-03-26 DIAGNOSIS — Z7982 Long term (current) use of aspirin: Secondary | ICD-10-CM | POA: Insufficient documentation

## 2020-03-26 DIAGNOSIS — G4730 Sleep apnea, unspecified: Secondary | ICD-10-CM | POA: Diagnosis not present

## 2020-03-26 DIAGNOSIS — I509 Heart failure, unspecified: Secondary | ICD-10-CM | POA: Diagnosis not present

## 2020-03-26 DIAGNOSIS — C50912 Malignant neoplasm of unspecified site of left female breast: Secondary | ICD-10-CM | POA: Insufficient documentation

## 2020-03-26 DIAGNOSIS — Z79899 Other long term (current) drug therapy: Secondary | ICD-10-CM | POA: Insufficient documentation

## 2020-03-26 DIAGNOSIS — J449 Chronic obstructive pulmonary disease, unspecified: Secondary | ICD-10-CM | POA: Insufficient documentation

## 2020-03-26 DIAGNOSIS — Z452 Encounter for adjustment and management of vascular access device: Secondary | ICD-10-CM | POA: Diagnosis not present

## 2020-03-26 HISTORY — PX: IR IMAGING GUIDED PORT INSERTION: IMG5740

## 2020-03-26 MED ORDER — FENTANYL CITRATE (PF) 100 MCG/2ML IJ SOLN
INTRAMUSCULAR | Status: AC | PRN
  Administered 2020-03-26 (×2): 25 ug via INTRAVENOUS

## 2020-03-26 MED ORDER — LIDOCAINE-EPINEPHRINE 1 %-1:100000 IJ SOLN
INTRAMUSCULAR | Status: AC
  Filled 2020-03-26: qty 1

## 2020-03-26 MED ORDER — SODIUM CHLORIDE 0.9 % IV SOLN: INTRAVENOUS | Status: DC

## 2020-03-26 MED ORDER — MIDAZOLAM HCL 2 MG/2ML IJ SOLN
INTRAMUSCULAR | Status: AC
  Filled 2020-03-26: qty 2

## 2020-03-26 MED ORDER — CEFAZOLIN SODIUM-DEXTROSE 2-4 GM/100ML-% IV SOLN
2.0000 g | Freq: Once | INTRAVENOUS | Status: AC
  Administered 2020-03-26: 2 g via INTRAVENOUS

## 2020-03-26 MED ORDER — LIDOCAINE HCL 1 % IJ SOLN
INTRAMUSCULAR | Status: AC | PRN
  Administered 2020-03-26: 20 mL

## 2020-03-26 MED ORDER — CEFAZOLIN SODIUM-DEXTROSE 2-4 GM/100ML-% IV SOLN
INTRAVENOUS | Status: AC
  Filled 2020-03-26: qty 100

## 2020-03-26 MED ORDER — MIDAZOLAM HCL 2 MG/2ML IJ SOLN
INTRAMUSCULAR | Status: AC | PRN
  Administered 2020-03-26 (×2): 0.5 mg via INTRAVENOUS

## 2020-03-26 MED ORDER — CHLORHEXIDINE GLUCONATE 4 % EX LIQD
CUTANEOUS | Status: AC
  Filled 2020-03-26: qty 15

## 2020-03-26 MED ORDER — LIDOCAINE-EPINEPHRINE 1 %-1:100000 IJ SOLN
INTRAMUSCULAR | Status: AC | PRN
  Administered 2020-03-26: 20 mL via INTRADERMAL

## 2020-03-26 MED ORDER — HEPARIN SOD (PORK) LOCK FLUSH 100 UNIT/ML IV SOLN
INTRAVENOUS | Status: AC
  Filled 2020-03-26: qty 5

## 2020-03-26 MED ORDER — FENTANYL CITRATE (PF) 100 MCG/2ML IJ SOLN
INTRAMUSCULAR | Status: AC
  Filled 2020-03-26: qty 2

## 2020-03-26 NOTE — Telephone Encounter (Signed)
Scheduled appt per 9/9 sch msg - pt is aware of appt

## 2020-03-26 NOTE — Discharge Instructions (Addendum)
Implanted Port Insertion, Care After This sheet gives you information about how to care for yourself after your procedure. Your health care provider may also give you more specific instructions. If you have problems or questions, contact your health care provider. What can I expect after the procedure? After the procedure, it is common to have:  Discomfort at the port insertion site.  Bruising on the skin over the port. This should improve over 3-4 days. Follow these instructions at home: Port care  After your port is placed, you will get a manufacturer's information card. The card has information about your port. Keep this card with you at all times.  Take care of the port as told by your health care provider. Ask your health care provider if you or a family member can get training for taking care of the port at home. A home health care nurse may also take care of the port.  Make sure to remember what type of port you have. Incision care      Follow instructions from your health care provider about how to take care of your port insertion site. Make sure you: ? Wash your hands with soap and water before and after you change your bandage (dressing). If soap and water are not available, use hand sanitizer. ? Change your dressing as told by your health care provider. ? Leave stitches (sutures), skin glue, or adhesive strips in place. These skin closures may need to stay in place for 2 weeks or longer. If adhesive strip edges start to loosen and curl up, you may trim the loose edges. Do not remove adhesive strips completely unless your health care provider tells you to do that.  Check your port insertion site every day for signs of infection. Check for: ? Redness, swelling, or pain. ? Fluid or blood. ? Warmth. ? Pus or a bad smell. Activity  Return to your normal activities as told by your health care provider. Ask your health care provider what activities are safe for you.  Do not  lift anything that is heavier than 10 lb (4.5 kg), or the limit that you are told, until your health care provider says that it is safe. General instructions  Take over-the-counter and prescription medicines only as told by your health care provider.  Do not take baths, swim, or use a hot tub until your health care provider approves. Ask your health care provider if you may take showers. You may only be allowed to take sponge baths.  Do not drive for 24 hours if you were given a sedative during your procedure.  Wear a medical alert bracelet in case of an emergency. This will tell any health care providers that you have a port.  Keep all follow-up visits as told by your health care provider. This is important. Contact a health care provider if:  You cannot flush your port with saline as directed, or you cannot draw blood from the port.  You have a fever or chills.  You have redness, swelling, or pain around your port insertion site.  You have fluid or blood coming from your port insertion site.  Your port insertion site feels warm to the touch.  You have pus or a bad smell coming from the port insertion site. Get help right away if:  You have chest pain or shortness of breath.  You have bleeding from your port that you cannot control. Summary  Take care of the port as told by your health   care provider. Keep the manufacturer's information card with you at all times.  Change your dressing as told by your health care provider.  Contact a health care provider if you have a fever or chills or if you have redness, swelling, or pain around your port insertion site.  Keep all follow-up visits as told by your health care provider. This information is not intended to replace advice given to you by your health care provider. Make sure you discuss any questions you have with your health care provider. Document Revised: 01/29/2018 Document Reviewed: 01/29/2018 Elsevier Patient Education   2020 Elsevier Inc. Moderate Conscious Sedation, Adult Sedation is the use of medicines to promote relaxation and relieve discomfort and anxiety. Moderate conscious sedation is a type of sedation. Under moderate conscious sedation, you are less alert than normal, but you are still able to respond to instructions, touch, or both. Moderate conscious sedation is used during short medical and dental procedures. It is milder than deep sedation, which is a type of sedation under which you cannot be easily woken up. It is also milder than general anesthesia, which is the use of medicines to make you unconscious. Moderate conscious sedation allows you to return to your regular activities sooner. Tell a health care provider about:  Any allergies you have.  All medicines you are taking, including vitamins, herbs, eye drops, creams, and over-the-counter medicines.  Use of steroids (by mouth or creams).  Any problems you or family members have had with sedatives and anesthetic medicines.  Any blood disorders you have.  Any surgeries you have had.  Any medical conditions you have, such as sleep apnea.  Whether you are pregnant or may be pregnant.  Any use of cigarettes, alcohol, marijuana, or street drugs. What are the risks? Generally, this is a safe procedure. However, problems may occur, including:  Getting too much medicine (oversedation).  Nausea.  Allergic reaction to medicines.  Trouble breathing. If this happens, a breathing tube may be used to help with breathing. It will be removed when you are awake and breathing on your own.  Heart trouble.  Lung trouble. What happens before the procedure? Staying hydrated Follow instructions from your health care provider about hydration, which may include:  Up to 2 hours before the procedure - you may continue to drink clear liquids, such as water, clear fruit juice, black coffee, and plain tea. Eating and drinking restrictions Follow  instructions from your health care provider about eating and drinking, which may include:  8 hours before the procedure - stop eating heavy meals or foods such as meat, fried foods, or fatty foods.  6 hours before the procedure - stop eating light meals or foods, such as toast or cereal.  6 hours before the procedure - stop drinking milk or drinks that contain milk.  2 hours before the procedure - stop drinking clear liquids. Medicine Ask your health care provider about:  Changing or stopping your regular medicines. This is especially important if you are taking diabetes medicines or blood thinners.  Taking medicines such as aspirin and ibuprofen. These medicines can thin your blood. Do not take these medicines before your procedure if your health care provider instructs you not to.  Tests and exams  You will have a physical exam.  You may have blood tests done to show: ? How well your kidneys and liver are working. ? How well your blood can clot. General instructions  Plan to have someone take you home from the   hospital or clinic.  If you will be going home right after the procedure, plan to have someone with you for 24 hours. What happens during the procedure?  An IV tube will be inserted into one of your veins.  Medicine to help you relax (sedative) will be given through the IV tube.  The medical or dental procedure will be performed. What happens after the procedure?  Your blood pressure, heart rate, breathing rate, and blood oxygen level will be monitored often until the medicines you were given have worn off.  Do not drive for 24 hours. This information is not intended to replace advice given to you by your health care provider. Make sure you discuss any questions you have with your health care provider. Document Revised: 06/15/2017 Document Reviewed: 10/23/2015 Elsevier Patient Education  2020 Elsevier Inc.  

## 2020-03-26 NOTE — Procedures (Signed)
Interventional Radiology Procedure Note  Procedure: Placement of a right IJ approach single lumen PowerPort.  Tip is positioned at the superior cavoatrial junction and catheter is ready for immediate use.  Complications: No immediate Recommendations:  - Ok to shower tomorrow - Do not submerge for 7 days - Routine line care   Signed,  Searra Carnathan K. Blanka Rockholt, MD   

## 2020-03-26 NOTE — H&P (Signed)
Chief Complaint: Patient was seen in consultation today for port-a-catheter placement.   Referring Physician(s): Alphonsa Overall  Supervising Physician: Jacqulynn Cadet  Patient Status: [facility] - Out-pt  History of Present Illness: Deborah [name] is a 64 y.o. female with a medical history significant for CHF, COPD and left lung cancer diagnosed October 2019. She completed her chemotherapy at the end of 2020. A surveillance CT chest 01/31/20 showed no concerning findings for disease progression but did however identify an enlarging left breast nodule. Mammogram and ultrasound showed a 3.7 cm mass and biopsy was positive for invasive ductal carcinoma. She will begin chemotherapy soon.  Interventional Radiology has been asked to evaluate this patient for an image-guided port-catheter placement to facilitate her treatment plans.   Past Medical History:  Diagnosis Date  . Anemia    as teen  . Asthma   . CHF (congestive heart failure) ([place])   . COPD, mild ([place])   . Depression   . Diverticulitis   . Family history of anesthesia complication    vomiting  . GI bleeding   . Heart failure ([place])    New onset 07/25/14  . Histoplasmosis    left eye  . Hyperkalemia   . Hypertension   . Lung mass 04/19/2018  . Obesity (BMI 30-39.9)   . Pneumonia    dx wtih pneumonia on 05/27/16- seen by [facility]   . PONV (postoperative nausea and vomiting)   . Restrictive lung disease   . Shortness of breath    with exertion   . Sleep apnea    mask and oxygen at nite for sleep at 2L   . Tobacco abuse   . Umbilical hernia     Past Surgical History:  Procedure Laterality Date  . APPLICATION OF WOUND VAC  03/19/2013   Procedure: APPLICATION OF WOUND VAC;  Surgeon: Madilyn Hook, DO;  Location: [facility] ORS;  Service: General;;  . CESAREAN SECTION  04/12/85  . COLONOSCOPY WITH PROPOFOL N/A 06/13/2016   Procedure: COLONOSCOPY WITH PROPOFOL;  Surgeon: Jerene Bears, MD;  Location: [facility] ENDOSCOPY;  Service:  Gastroenterology;  Laterality: N/A;  . ERCP N/A 02/15/2017   Procedure: ENDOSCOPIC RETROGRADE CHOLANGIOPANCREATOGRAPHY (ERCP);  Surgeon: [name], MD;  Location: [facility];  Service: Endoscopy;  Laterality: N/A;  . INSERTION OF MESH N/A 03/19/2013   Procedure: INSERTION OF MESH;  Surgeon: Madilyn Hook, DO;  Location: [facility] ORS;  Service: General;  Laterality: N/A;  . VENTRAL HERNIA REPAIR N/A 03/19/2013   Procedure:  OPEN VENTRAL HERNIA REPAIR WITH MESH AND APPLICATION OF WOUND VAC;  Surgeon: Madilyn Hook, DO;  Location: [facility]S;  Service: General;  Laterality: N/A;  . VIDEO BRONCHOSCOPY Bilateral 04/19/2018   Procedure: VIDEO BRONCHOSCOPY WITH FLUORO;  Surgeon: Rigoberto Noel, MD;  Location: [facility] ENDOSCOPY;  Service: Cardiopulmonary;  Laterality: Bilateral;    Allergies: Patient has no known allergies.  Medications: Prior to Admission medications   Medication Sig Start Date End Date Taking? Authorizing Provider  albuterol (VENTOLIN HFA) 108 (90 Base) MCG/ACT inhaler Inhale 2 puffs into the lungs every 6 (six) hours as needed for wheezing or shortness of breath. 01/16/20  Yes Brand Males, MD  aspirin 81 MG chewable tablet Chew 1 tablet (81 mg total) by mouth daily. 10/05/14  Yes [name], MD  Aspirin-Caffeine [facility] FAST PAIN RELIEF ARTHRITIS PO) Take 1 packet by mouth daily as needed (back pain).   Yes [provider]  Budeson-Glycopyrrol-Formoterol (BREZTRI AEROSPHERE) 160-9-4.8 MCG/ACT AERO Inhale  2 puffs into the lungs daily. Patient taking differently: Inhale 2 puffs into the lungs 2 (two) times daily.  01/14/20  Yes Brand Males, MD  diltiazem (CARDIZEM CD) 240 MG 24 hr capsule TAKE 1 CAPSULE BY MOUTH EVERY DAY Patient taking differently: Take 240 mg by mouth daily.  07/03/19  Yes [name], MD  furosemide (LASIX) 20 MG tablet Take 2 tablets (40 mg total) by mouth daily. 11/12/19  Yes Pleas Koch, NP  montelukast (SINGULAIR) 10 MG tablet Take 1 tablet (10  mg total) by mouth at bedtime. 12/31/19  Yes Pleas Koch, NP  potassium chloride SA (KLOR-CON M20) 20 MEQ tablet TAKE 1/2 TABLET BY MOUTH EVERY DAY Patient taking differently: Take 10 mEq by mouth daily.  11/26/19  Yes Pleas Koch, NP  ciclopirox (CICLODAN) 0.77 % cream Apply topically 2 (two) times daily. Patient not taking: Reported on 03/23/2020 12/16/19   Pleas Koch, NP  OXYGEN Inhale 4 L/min into the lungs continuous.     [provider]  predniSONE (DELTASONE) 10 MG tablet Take 1 tablet (10 mg total) by mouth daily with breakfast. Patient not taking: Reported on 03/23/2020 01/14/20   Brand Males, MD  Spacer/Aero-Holding Chambers (AEROCHAMBER MV) inhaler Use as instructed 01/01/17   Parrett, Fonnie Mu, NP     Family History  Problem Relation Age of Onset  . Heart attack Mother   . Emphysema Mother        was a smoker  . Congestive Heart Failure Father   . Bipolar disorder Father   . Lymphoma Maternal Uncle   . Pancreatic cancer Maternal Aunt   . Bipolar disorder Sister   . Schizophrenia Sister   . Other Sister        tumor in her neck  . Melanoma Sister   . Heart attack Maternal Uncle   . Heart attack Maternal Uncle   . Colon cancer Neg Hx   . Breast cancer Neg Hx     Social History   Socioeconomic History  . Marital status: Married    Spouse name: Not on file  . Number of children: 1  . Years of education: Not on file  . Highest education level: Not on file  Occupational History  . Occupation: Retired    Fish farm manager: NOT EMPLOYED  Tobacco Use  . Smoking status: Former Smoker    Packs/day: 3.00    Years: 43.00    Pack years: 129.00    Types: Cigarettes    Quit date: 12/17/2012    Years since quitting: 7.2  . Smokeless tobacco: Never Used  Vaping Use  . Vaping Use: Former  Substance and Sexual Activity  . Alcohol use: No    Alcohol/week: 0.0 standard drinks  . Drug use: No  . Sexual activity: Not on file  Other Topics Concern  . Not  on file  Social History Narrative   ** Merged History Encounter **       ** Data from: 07/25/14 Enc Dept: [facility]       ** Data from: 06/05/13 Enc Dept: [facility]   Married, one son 71 yo.            Social Determinants of Health   Financial Resource Strain:   . Difficulty of Paying Living Expenses: Not on file  Food Insecurity:   . Worried About Charity fundraiser in the Last Year: Not on file  . Ran Out of Food in the Last Year:  Not on file  Transportation Needs:   . Lack of Transportation (Medical): Not on file  . Lack of Transportation (Non-Medical): Not on file  Physical Activity:   . Days of Exercise per Week: Not on file  . Minutes of Exercise per Session: Not on file  Stress:   . Feeling of Stress : Not on file  Social Connections:   . Frequency of Communication with Friends and Family: Not on file  . Frequency of Social Gatherings with Friends and Family: Not on file  . Attends Religious Services: Not on file  . Active Member of Clubs or Organizations: Not on file  . Attends Archivist Meetings: Not on file  . Marital Status: Not on file    Review of Systems: A 12 point ROS discussed and pertinent positives are indicated in the HPI above.  All other systems are negative.  Review of Systems  Constitutional: Positive for fatigue. Negative for appetite change.  Respiratory: Positive for shortness of breath.   Cardiovascular: Negative for chest pain and leg swelling.  Gastrointestinal: Negative for abdominal pain, diarrhea, nausea and vomiting.  Musculoskeletal: Negative for back pain.  Neurological: Negative for headaches.    Vital Signs: BP (!) 143/83   Pulse (!) 117   Temp 98.3 F (36.8 C) (Oral)   Resp 20   Ht 5\' 2"  (1.575 m)   Wt 255 lb (115.7 kg)   SpO2 95%   BMI 46.64 kg/m   Physical Exam Constitutional:      General: She is not in acute distress.    Appearance: She is obese.  HENT:     Mouth/Throat:     Mouth:  Mucous membranes are moist.     Pharynx: Oropharynx is clear.  Cardiovascular:     Rate and Rhythm: Regular rhythm. Tachycardia present.     Pulses: Normal pulses.     Heart sounds: Normal heart sounds.  Pulmonary:     Comments: Home O2 dependent. Expiratory wheezes and fine crackles throughout left and right lungs.  Skin:    General: Skin is warm and dry.  Neurological:     Mental Status: She is alert and oriented to person, place, and time.     Imaging: No results found.  Labs:  CBC: Recent Labs    07/29/19 1220 10/27/19 1129 01/14/20 0934 01/26/20 1019  WBC 9.0 8.2 9.2 10.0  HGB 13.0 12.1 12.5 12.6  HCT 42.0 40.6 38.8 41.7  PLT 294 264 307.0 280    COAGS: No results for input(s): INR, APTT in the last 8760 hours.  BMP: Recent Labs    07/15/19 1008 07/15/19 1008 07/29/19 1220 10/27/19 1129 01/14/20 0934 01/26/20 1019  NA 144   < > 143 141 139 143  K 4.2   < > 4.3 4.4 3.8 3.9  CL 104   < > 100 102 98 105  CO2 28   < > 28 31 32 27  GLUCOSE 115*   < > 116* 110* 114* 99  BUN 14   < > 16 14 18 15   CALCIUM 8.8*   < > 8.7* 9.2 9.3 9.2  CREATININE 0.84   < > 0.94 0.83 0.80 0.77  GFRNONAA >60  --  >60 >60  --  >60  GFRAA >60  --  >60 >60  --  >60   < > = values in this interval not displayed.    LIVER FUNCTION TESTS: Recent Labs    07/29/19 1220 10/27/19 1129 01/14/20  1638 01/26/20 1019  BILITOT <0.2* <0.2* 0.2 <0.2*  AST 14* 18 26 22   ALT 14 20 38* 39  ALKPHOS 102 86 81 88  PROT 7.9 7.4 7.9 7.6  ALBUMIN 3.6 3.2* 4.0 3.3*    TUMOR MARKERS: No results for input(s): AFPTM, CEA, CA199, CHROMGRNA in the last 8760 hours.  Assessment and Plan:  Left breast cancer: [name], 64 year old female, presents today to the [facility] department for an image-guided port-a-catheter placement.   Risks and benefits of an image-guided port-a-catheter placement were discussed with the patient including, but not limited to  bleeding, infection, pneumothorax, or fibrin sheath development and need for additional procedures.  All of the patient's questions were answered, patient is agreeable to proceed.  The patient has been NPO. Vitals have been reviewed. She is not on any blood-thinning medications.   Consent signed and in chart.  Thank you for this interesting consult.  I greatly enjoyed meeting YANAI HOBSON and look forward to participating in their care.  A copy of this report was sent to the requesting provider on this date.  Electronically Signed: Soyla Dryer, AGACNP-BC [phone] 03/26/2020, 11:19 AM   I spent a total of  30 Minutes   in face to face in clinical consultation, greater than 50% of which was counseling/coordinating care for port-a-catheter placement.

## 2020-03-29 ENCOUNTER — Encounter: Payer: Self-pay | Admitting: *Deleted

## 2020-03-31 ENCOUNTER — Encounter (HOSPITAL_COMMUNITY): Payer: Self-pay

## 2020-03-31 NOTE — Progress Notes (Addendum)
Anesthesia PAT Evaluation:   Case: [id number] Date/Time: 04/08/20 0815   Procedure: LEFT BREAST LUMPECTOMY WITH RADIOACTIVE SEED AND LEFT AXILLARY SENTINEL LYMPH NODE BIOPSY (Left Breast) - PEC BLOCK, NEEDS ANESTHESIA CONSULT   Anesthesia type: General   Pre-op diagnosis: LEFT BREAST CANCER   Location: [facility] ROOM 02 / [facility]   Surgeons: Alphonsa Overall, MD    RSL 04/07/20. Hold ASA x5 days preop. Case is posted for out-patient with bed.   DISCUSSION: Patient is a 64 year old female scheduled for the above procedure. She has COPD, lung cancer, diastolic CHF, and is on home O2. Last month, she was diagnosed with left breast cancer. According to 03/08/20 oncology notes, breast conserving surgery with sentinel lymph node study, adjuvant chemotherapy followed by radiation recommended. Given her limited mobility (able to walk ~ 20 feet with walker) and chronic home O2 (4L continuous, but able to tolerate 2L at rest), there is concern that she may not be able to tolerate chemotherapy, but patient wants to attempt aggressive therapy.   History includes former smoker (quit 12/17/12), post-operative N/V, asthma, COPD (home O2, 4L continuous, but uses 2L/[name] when out of the house in wheelchair), exertional dyspnea, OSA (CPAP, auto 5-10, 4L O2 blended), lung cancer (Stage IIIB SCC LLL 05/02/18, s/p chemoradiation, immunotherapy), left breast cancer (diagnosed 02/19/20), HTN, chronic diastolic CHF (with RH failure, 07/2014), sinus tachycardia, histoplasmosis (left eye), GI bleed, ventral hernia repair (03/20/13, discharged on home O2), morbid obesity. S/p placement of a right IJ approach single lumen PowerPort by IR on 03/26/20.   - Preoperative cardiology input outlined on 02/25/20 by Kerin Ransom, PA-C: "...Given past medical history and time since last visit, based on ACC/AHA guidelines, BRINLEE GAMBRELL would be at acceptable risk for the planned procedure without further cardiovascular testing. OK to hold aspirin 5 days pre  op if needed." Dr. Fletcher Anon also aware.   - Preoperative pulmonology input outlined on 02/23/20 by Wyn Quaker, NP: "Peri-operative Assessment of Pulmonary Risk for Non-Thoracic Surgery:  ForMs. Santino, moderate to high risk of perioperative pulmonary complications is increased by:             Diffusion Defect              Chronic O2             COPD / Emphysema              Hx of Smoking             Obstructive sleep apnea             NYHA Class III Pulmonary Hypertension             Physically Deconditioned  Respiratory complications generally occur in 1% of ASA Class I patients, 5% of ASA Class II and 10% of ASA Class III-IV patients These complications rarely result in mortality and iclude postoperative pneumonia, atelectasis, pulmonary embolism, ARDS and increased time requiring postoperative mechanical ventilation.  Overall, I recommend proceeding with the surgery if the risk for respiratory complications are outweighed by the potential benefits. This will need to be discussed between the patient and surgeon.  To reduce risks of respiratory complications, I recommend: --Pre- and post-operative incentive spirometry performed frequently while awake --Inpatient use of currently prescribed positive-pressure for OSA whenever the patient is sleeping --Avoiding use of pancuronium during anesthesia. --Needs cards surg approval   I have discussed the risk factors and recommendations above with the patient."  - I evaluated  patient during her PAT visit. (Patient wearing mask, and I was wearing N95 mask covered by surgical mask, as well as wearing glasses and face shield.) She feels at her baseline. She denied SOB at rest, but does have DOE with even minimal exertion. She is on 2-4L O2/[place] as mentioned above. She sleeps in either her adjustable bed or recliner with her HOB ~ 45 degrees to fall asleep. She has an occasional productive cough and periodic wheezing which is her baseline. She uses  albuterol routinely (every 4-6 hours while awake) because she feels that it helps when she does have to move around. She is also on Breztri 2 puffs and Singulair 10 mg daily. She gets occasional BLE edema, but typically controlled with keeping her legs elevated as much as possible and with diuretic therapy. She has become more deconditioned since her lung cancer treatments and then the [place] pandemic because she has stayed mostly at home. She gets shaky and fatigued if she tries to walk very far. She uses a wheelchair when out of the house. She is compliant with her medications. She denied limitation in her mouth opening or neck mobility. She has not had post-operative N/V in a long while.   Discussed case with anesthesiologist Andres Shad, MD and that patient's husband also requests to be present when she is initially evaluated by the anesthesiologist on the day of surgery. She is concerned because she knows she is at increased risk given her cardiopulmonary history. Overnight stay is planned. She is aware that case is posted for general anesthesia. We discussed potential for LMA or ETT, to be determined by her assigned anesthesiologist. Remaining intubated after surgery is a possibility, but maintaining her on O2, having CPAP available and using pectoral block (improve pain control will help respiratory effort) will hopefully lessen those chances. It is reassuring that she remains at her baseline and has had recent evaluations with preoperative input from cardiology and pulmonology. Discussed that should she develop new symptoms between now and surgery then to communicate that with her surgeon and appropriate specialists as we want her optimized/at baseline for surgery.   Preoperative COVID-19 test is scheduled for 04/05/20. Anesthesia team to evaluate on the day of surgery.    VS: BP 124/79   Pulse 90   Temp 36.8 C (Oral)   Resp 18   Ht 5\' 2"  (1.575 m)   Wt 109.3 kg   SpO2 94%   BMI 44.08 kg/m    Pulse Readings from Last 3 Encounters:  03/26/20 (!) 117  03/08/20 (!) 113  02/23/20 (!) 119  Examined in wheelchair. Heart sounds regular, distant. I did not auscultate a murmur or carotid bruits. Lungs sounds with intermittent wheeze bilateral posterior bases, clear anteriorly. She was wearing a face mask, but no obvious conversational dyspnea. No increased work of breathing. No ankle edema.     PROVIDERS: Pleas Koch, NP is PCP. Last evaluation 12/16/19. Nicholas Lose, MD is HEM-ONC (Breast Cancer). Last evaluation 03/08/20. Julien Nordmann, Mohamed is HEM-ONC (Lung Cancer). Last evaluation 01/28/20.  Continued observation for lung cancer but referred for mammogram due to finding of left breast mass. Kyung Rudd, MD is RAD-ONC. Last evaluation 03/04/20.  Kathlyn Sacramento, MD is cardiologist. Last evaluation 12/16/19. Volume status okay. Continued to be mildly tachycardic. Some worsening dyspnea, possibly from underlying lung disease, but ordered an echo to rule out cardiomyopathy (LVEF > 55%). Six month follow-up recommended.  Brand Males, MD is pulmonologist. Last evaluation  02/23/20 for preoperative pulmonology risk assessment. Pulmonary rehab referral will be considered after recovery from breast surgery. She is to establish with new pulmonologist and sleep medicine physician with that office, ans she was having some difficulty getting in to see Dr. Geoffery Spruce due to hybrid pulmonary specialities.    LABS:Preopeative labs noted.  (all labs ordered are listed, but only abnormal results are displayed)  Labs Reviewed  BASIC METABOLIC PANEL - Abnormal; Notable for the following components:      Result Value   Chloride 95 (*)    Glucose, Bld 108 (*)    All other components within normal limits  CBC - Abnormal; Notable for the following components:   RBC 5.22 (*)    HCT 48.5 (*)    MCHC 29.5 (*)    All other components within normal limits    PFTs 02/18/2020: FVC 1.29 (40%  predicted), postbronchodilator ratio 75, postbronchodilator FEV1 1.00 (40% predicted), no bronchodilator response, DLCO 11.41 (58% predicted)   IMAGES: CT Chest 01/26/20: IMPRESSION: 1. Similar appearance of left-sided treatment change, without findings of recurrent or metastatic disease. 2. Resolved left pleural effusion with mild left pleural thickening remaining. 3. Suspicion of an enlarging left breast nodule. Consider correlation with diagnostic mammogram and ultrasound. These results will be called to the ordering clinician or representative by the Radiologist Assistant, and communication documented in the PACS or [employer]. 4. Aortic Atherosclerosis (ICD10-I70.0) and Emphysema (ICD10-J43.9). 5. Age advanced coronary artery atherosclerosis. Recommend assessment of coronary risk factors and consideration of medical therapy. 6. Hepatic steatosis.   EKG: 12/16/19 ([facility]): Sinus tachycardia at 118 bpm Rightward axis Low voltage QRS Nonspecific T wave abnormality Abnormal ECG   CV: Echo 01/27/20: IMPRESSIONS  1. Left ventricular ejection fraction, by estimation, is >55%. The left  ventricle has normal function. Left ventricular endocardial border not  optimally defined to evaluate regional wall motion. There is moderate left  ventricular hypertrophy.  Indeterminate diastolic filling due to E-A fusion.  2. Right ventricular systolic function is normal. The right ventricular  size is not well visualized. Mildly increased right ventricular wall  thickness. Tricuspid regurgitation signal is inadequate for assessing PA  pressure.  3. The mitral valve was not well visualized. No evidence of mitral valve  regurgitation.  4. The aortic valve was not well visualized. Aortic valve regurgitation  is not visualized. No aortic stenosis is present.    Past Medical History:  Diagnosis Date  . Anemia    as teen  . Asthma   . Breast cancer ([place])    left breast  cancer 02/2020  . CHF (congestive heart failure) ([place])   . COPD, mild ([place])   . Depression   . Diverticulitis   . Family history of anesthesia complication    vomiting  . GI bleeding   . Heart failure ([place])    New onset 07/25/14  . Histoplasmosis    left eye  . Hyperkalemia   . Hypertension   . Lung cancer ([place]) 05/02/2018   s/p chemoradiation, immunotherapy  . Obesity (BMI 30-39.9)   . Pneumonia    dx wtih pneumonia on 05/27/16- seen by [facility]   . PONV (postoperative nausea and vomiting)   . Restrictive lung disease   . Shortness of breath    with exertion   . Sleep apnea    mask and oxygen at nite for sleep at 2L   . Tobacco abuse   . Umbilical hernia     Past Surgical History:  Procedure Laterality Date  . APPLICATION OF WOUND VAC  03/19/2013   Procedure: APPLICATION OF WOUND VAC;  Surgeon: Madilyn Hook, DO;  Location: [facility];  Service: General;;  . CESAREAN SECTION  04/12/85  . COLONOSCOPY WITH PROPOFOL N/A 06/13/2016   Procedure: COLONOSCOPY WITH PROPOFOL;  Surgeon: Jerene Bears, MD;  Location: [facility];  Service: Gastroenterology;  Laterality: N/A;  . ERCP N/A 02/15/2017   Procedure: ENDOSCOPIC RETROGRADE CHOLANGIOPANCREATOGRAPHY (ERCP);  Surgeon: [name], MD;  Location: [facility];  Service: Endoscopy;  Laterality: N/A;  . INSERTION OF MESH N/A 03/19/2013   Procedure: INSERTION OF MESH;  Surgeon: Madilyn Hook, DO;  Location: [facility];  Service: General;  Laterality: N/A;  . IR IMAGING GUIDED PORT INSERTION  03/26/2020  . VENTRAL HERNIA REPAIR N/A 03/19/2013   Procedure:  OPEN VENTRAL HERNIA REPAIR WITH MESH AND APPLICATION OF WOUND VAC;  Surgeon: Madilyn Hook, DO;  Location: [facility];  Service: General;  Laterality: N/A;  . VIDEO BRONCHOSCOPY Bilateral 04/19/2018   Procedure: VIDEO BRONCHOSCOPY WITH FLUORO;  Surgeon: Rigoberto Noel, MD;  Location: [facility];  Service: Cardiopulmonary;  Laterality: Bilateral;    MEDICATIONS: . albuterol (VENTOLIN HFA) 108 (90  Base) MCG/ACT inhaler  . aspirin 81 MG chewable tablet  . Aspirin-Caffeine (BC FAST PAIN RELIEF ARTHRITIS PO)  . Budeson-Glycopyrrol-Formoterol (BREZTRI AEROSPHERE) 160-9-4.8 MCG/ACT AERO  . diltiazem (CARDIZEM CD) 240 MG 24 hr capsule  . furosemide (LASIX) 20 MG tablet  . montelukast (SINGULAIR) 10 MG tablet  . OXYGEN  . potassium chloride SA (KLOR-CON M20) 20 MEQ tablet  . Spacer/Aero-Holding Chambers (AEROCHAMBER MV) inhaler   No current facility-administered medications for this encounter.    Myra Gianotti, PA-C Surgical Short Stay/Anesthesiology [facility] Phone [phone] [facility] Phone [phone] 04/02/2020 3:59 PM

## 2020-04-01 NOTE — Progress Notes (Signed)
[id number] [address], [place] [address] Phone: [phone] Fax: [phone]      Your procedure is scheduled on September 23  Report to [facility] Main Entrance "A" at 0630 am  A.M., and check in at the Admitting office.  Call this number if you have problems the morning of surgery:  [phone]  Call [phone] if you have any questions prior to your surgery date Monday-Friday 8am-4pm    Remember:  Do not eat after midnight the night before your surgery  You may drink clear liquids until 0530 am the morning of your surgery.   Clear liquids allowed are: Water, Non-Citrus Juices (without pulp), Carbonated Beverages, Clear Tea, Black Coffee Only, and Gatorade  Please complete your PRE-SURGERY ENSURE that was provided to you by 0530 am the morning of surgery.  Please, if able, drink it in one setting. DO NOT SIP.     Take these medicines the morning of surgery with A SIP OF WATER  albuterol (VENTOLIN HFA) if needed, Please bring all inhalers with you the day of surgery.  diltiazem (CARDIZEM CD  Hold Aspirin 5 days prior to surgery  As of today, STOP taking any Aspirin (unless otherwise instructed by your surgeon) Aleve, Naproxen, Ibuprofen, Motrin, Advil, Goody's, BC's, all herbal medications, fish oil, and all vitamins.                      Do not wear jewelry, make up, or nail polish            Do not wear lotions, powders, perfumes/colognes, or deodorant.            Do not shave 48 hours prior to surgery.              Do not bring valuables to the hospital.            [facility] is not responsible for any belongings or valuables.  Do NOT Smoke (Tobacco/Vaping) or drink Alcohol 24 hours prior to your procedure If you use a CPAP at night, you may bring all equipment for your overnight stay.   Contacts, glasses, dentures or bridgework may not be worn into surgery.      For patients admitted to the hospital,  discharge time will be determined by your treatment team.   Patients discharged the day of surgery will not be allowed to drive home, and someone needs to stay with them for 24 hours.    Special instructions:   [facility]- Preparing For Surgery  Before surgery, you can play an important role. Because skin is not sterile, your skin needs to be as free of germs as possible. You can reduce the number of germs on your skin by washing with CHG (chlorahexidine gluconate) Soap before surgery.  CHG is an antiseptic cleaner which kills germs and bonds with the skin to continue killing germs even after washing.    Oral Hygiene is also important to reduce your risk of infection.  Remember - BRUSH YOUR TEETH THE MORNING OF SURGERY WITH YOUR REGULAR TOOTHPASTE  Please do not use if you have an allergy to CHG or antibacterial soaps. If your skin becomes reddened/irritated stop using the CHG.  Do not shave (including legs and underarms) for at least 48 hours prior to first CHG shower. It is OK to shave your face.  Please follow these instructions carefully.   1. Shower the NIGHT BEFORE SURGERY and the  MORNING OF SURGERY with CHG Soap.   2. If you chose to wash your hair, wash your hair first as usual with your normal shampoo.  3. After you shampoo, rinse your hair and body thoroughly to remove the shampoo.  4. Use CHG as you would any other liquid soap. You can apply CHG directly to the skin and wash gently with a scrungie or a clean washcloth.   5. Apply the CHG Soap to your body ONLY FROM THE NECK DOWN.  Do not use on open wounds or open sores. Avoid contact with your eyes, ears, mouth and genitals (private parts). Wash Face and genitals (private parts)  with your normal soap.   6. Wash thoroughly, paying special attention to the area where your surgery will be performed.  7. Thoroughly rinse your body with warm water from the neck down.  8. DO NOT shower/wash with your normal soap after using  and rinsing off the CHG Soap.  9. Pat yourself dry with a CLEAN TOWEL.  10. Wear CLEAN PAJAMAS to bed the night before surgery  11. Place CLEAN SHEETS on your bed the night of your first shower and DO NOT SLEEP WITH PETS.   Day of Surgery: Wear Clean/Comfortable clothing the morning of surgery Do not apply any deodorants/lotions.   Remember to brush your teeth WITH YOUR REGULAR TOOTHPASTE.   Please read over the following fact sheets that you were given.

## 2020-04-02 ENCOUNTER — Other Ambulatory Visit: Payer: Self-pay

## 2020-04-02 ENCOUNTER — Encounter (HOSPITAL_COMMUNITY): Payer: Self-pay

## 2020-04-02 ENCOUNTER — Encounter (HOSPITAL_COMMUNITY)
Admission: RE | Admit: 2020-04-02 | Discharge: 2020-04-02 | Disposition: A | Discharge status: Home / Self Care | Payer: BLUE CROSS/BLUE SHIELD | Source: Ambulatory Visit | Attending: Surgery | Admitting: Surgery

## 2020-04-02 DIAGNOSIS — Z85118 Personal history of other malignant neoplasm of bronchus and lung: Secondary | ICD-10-CM | POA: Diagnosis not present

## 2020-04-02 DIAGNOSIS — I2510 Atherosclerotic heart disease of native coronary artery without angina pectoris: Secondary | ICD-10-CM | POA: Diagnosis not present

## 2020-04-02 DIAGNOSIS — G4730 Sleep apnea, unspecified: Secondary | ICD-10-CM | POA: Diagnosis not present

## 2020-04-02 DIAGNOSIS — I110 Hypertensive heart disease with heart failure: Secondary | ICD-10-CM | POA: Diagnosis not present

## 2020-04-02 DIAGNOSIS — J449 Chronic obstructive pulmonary disease, unspecified: Secondary | ICD-10-CM | POA: Insufficient documentation

## 2020-04-02 DIAGNOSIS — E669 Obesity, unspecified: Secondary | ICD-10-CM | POA: Insufficient documentation

## 2020-04-02 DIAGNOSIS — I5020 Unspecified systolic (congestive) heart failure: Secondary | ICD-10-CM | POA: Diagnosis not present

## 2020-04-02 DIAGNOSIS — Z9981 Dependence on supplemental oxygen: Secondary | ICD-10-CM | POA: Diagnosis not present

## 2020-04-02 DIAGNOSIS — Z79899 Other long term (current) drug therapy: Secondary | ICD-10-CM | POA: Diagnosis not present

## 2020-04-02 DIAGNOSIS — I700 Atherosclerosis of aorta: Secondary | ICD-10-CM | POA: Insufficient documentation

## 2020-04-02 DIAGNOSIS — Z6841 Body Mass Index (BMI) 40.0 and over, adult: Secondary | ICD-10-CM | POA: Insufficient documentation

## 2020-04-02 DIAGNOSIS — C50912 Malignant neoplasm of unspecified site of left female breast: Secondary | ICD-10-CM | POA: Diagnosis not present

## 2020-04-02 DIAGNOSIS — Z01812 Encounter for preprocedural laboratory examination: Secondary | ICD-10-CM | POA: Insufficient documentation

## 2020-04-02 DIAGNOSIS — K760 Fatty (change of) liver, not elsewhere classified: Secondary | ICD-10-CM | POA: Insufficient documentation

## 2020-04-02 DIAGNOSIS — Z7982 Long term (current) use of aspirin: Secondary | ICD-10-CM | POA: Insufficient documentation

## 2020-04-02 HISTORY — DX: Malignant neoplasm of unspecified site of unspecified female breast: C50.919

## 2020-04-02 LAB — CBC
HCT: 48.5 % — ABNORMAL HIGH (ref 36.0–46.0)
Hemoglobin: 14.3 g/dL (ref 12.0–15.0)
MCH: 27.4 pg (ref 26.0–34.0)
MCHC: 29.5 g/dL — ABNORMAL LOW (ref 30.0–36.0)
MCV: 92.9 fL (ref 80.0–100.0)
Platelets: 293 10*3/uL (ref 150–400)
RBC: 5.22 MIL/uL — ABNORMAL HIGH (ref 3.87–5.11)
RDW: 15.2 % (ref 11.5–15.5)
WBC: 10.3 10*3/uL (ref 4.0–10.5)
nRBC: 0 % (ref 0.0–0.2)

## 2020-04-02 LAB — BASIC METABOLIC PANEL
Anion gap: 12 (ref 5–15)
BUN: 11 mg/dL (ref 8–23)
CO2: 32 mmol/L (ref 22–32)
Calcium: 9.3 mg/dL (ref 8.9–10.3)
Chloride: 95 mmol/L — ABNORMAL LOW (ref 98–111)
Creatinine, Ser: 0.87 mg/dL (ref 0.44–1.00)
GFR calc Af Amer: >60 mL/min (ref >60)
GFR calc non Af Amer: >60 mL/min (ref >60)
Glucose, Bld: 108 mg/dL — ABNORMAL HIGH (ref 70–99)
Potassium: 4 mmol/L (ref 3.5–5.1)
Sodium: 139 mmol/L (ref 135–145)

## 2020-04-02 NOTE — Progress Notes (Addendum)
PCP - Alma Friendly, NP Cardiologist - Dr. Fletcher Anon; cardiac clearance in Epic 02/25/20 Pulmonologist - Dr. Chase Caller; clearance in Epic 02/23/2020 Oncologist - Dr. Julien Nordmann for lung cancer         - Dr. [name] for breat cancer  PPM/ICD - n/a Device Orders -  Rep Notified -   Chest x-ray - n/a EKG - 61/2021 Stress Test - patient denies ECHO - 01/27/2020 Cardiac Cath - patient denies PFTs - 02/18/2020  Sleep Study - 08/2014 CPAP - yes  Fasting Blood Sugar - n/a Checks Blood Sugar _____ times a day  Blood Thinner Instructions: Aspirin Instructions: hold for 5 days per Dr. Fletcher Anon  ERAS Protcol - clears until 0530 PRE-SURGERY Ensure or G2- Ensure given  COVID TEST- scheduled for Monday 04/05/2020   Anesthesia review: yes, per MD and patient request  Patient denies shortness of breath, fever, cough and chest pain at PAT appointment   All instructions explained to the patient, with a verbal understanding of the material. Patient agrees to go over the instructions while at home for a better understanding. Patient also instructed to self quarantine after being tested for COVID-19. The opportunity to ask questions was provided.

## 2020-04-02 NOTE — Anesthesia Preprocedure Evaluation (Addendum)
Anesthesia Evaluation  Patient identified by MRN, date of birth, ID band Patient awake    Reviewed: Allergy & Precautions, NPO status , Patient's Chart, lab work & pertinent test results  History of Anesthesia Complications (+) PONV  Airway Mallampati: II  TM Distance: >3 FB Neck ROM: Full    Dental no notable dental hx. (+)    Pulmonary shortness of breath, with exertion, lying and Long-Term Oxygen Therapy, asthma , sleep apnea and Continuous Positive Airway Pressure Ventilation , COPD (4L continuous),  oxygen dependent, former smoker,    Pulmonary exam normal breath sounds clear to auscultation       Cardiovascular Exercise Tolerance: Poor hypertension, +CHF  Normal cardiovascular exam Rhythm:Regular Rate:Normal  pulm htn   Neuro/Psych PSYCHIATRIC DISORDERS Depression    GI/Hepatic negative GI ROS, Neg liver ROS,   Endo/Other  Morbid obesity  Renal/GU   negative genitourinary   Musculoskeletal   Abdominal   Peds negative pediatric ROS (+)  Hematology   Anesthesia Other Findings   Reproductive/Obstetrics                           Anesthesia Physical Anesthesia Plan  ASA: IV  Anesthesia Plan: General and Regional   Post-op Pain Management: GA combined w/ Regional for post-op pain   Induction:   PONV Risk Score and Plan: 3  Airway Management Planned: LMA  Additional Equipment:   Intra-op Plan:   Post-operative Plan: Possible Post-op intubation/ventilation  Informed Consent: I have reviewed the patients History and Physical, chart, labs and discussed the procedure including the risks, benefits and alternatives for the proposed anesthesia with the patient or authorized representative who has indicated his/her understanding and acceptance.     Dental advisory given  Plan Discussed with:   Anesthesia Plan Comments: (PEC block with Exparel for postop pain control. GA/LMA.  Would like to avoid intubation unless clinically necessary as patient is high pulmonary risk. GETA as backup plan. Patient understands she is high risk to have a pulmonary complication and remain intubated postoperatively.  )      Anesthesia Quick Evaluation

## 2020-04-05 ENCOUNTER — Other Ambulatory Visit (HOSPITAL_COMMUNITY)
Admission: RE | Admit: 2020-04-05 | Discharge: 2020-04-05 | Disposition: A | Discharge status: Home / Self Care | Payer: BLUE CROSS/BLUE SHIELD | Source: Ambulatory Visit | Attending: Surgery | Admitting: Surgery

## 2020-04-05 DIAGNOSIS — Z01812 Encounter for preprocedural laboratory examination: Secondary | ICD-10-CM | POA: Diagnosis not present

## 2020-04-05 DIAGNOSIS — Z20822 Contact with and (suspected) exposure to covid-19: Secondary | ICD-10-CM | POA: Insufficient documentation

## 2020-04-06 LAB — SARS CORONAVIRUS 2 (TAT 6-24 HRS): SARS Coronavirus 2: NEGATIVE

## 2020-04-07 ENCOUNTER — Other Ambulatory Visit: Payer: Self-pay

## 2020-04-07 ENCOUNTER — Ambulatory Visit
Admission: RE | Admit: 2020-04-07 | Discharge: 2020-04-07 | Disposition: A | Discharge status: Home / Self Care | Payer: BLUE CROSS/BLUE SHIELD | Source: Ambulatory Visit | Attending: Surgery | Admitting: Surgery

## 2020-04-07 DIAGNOSIS — Z171 Estrogen receptor negative status [ER-]: Secondary | ICD-10-CM

## 2020-04-07 DIAGNOSIS — C50912 Malignant neoplasm of unspecified site of left female breast: Secondary | ICD-10-CM

## 2020-04-07 NOTE — H&P (Signed)
Deborah [name]  Location: [facility] Patient #: [id number] DOB: [birth year] Married / Language: English / Race: White Female  History of Present Illness   The patient is a 64 year old female who presents with a complaint of breast cancer.  The PCP is Deborah Councilman, NP  She sees Dr. Julien [name] for lung cancer  Her husband, Deborah [name], is with her.  She is being treated by Dr. Julien [name] for a left lung cancer diagnosed in 2019. She was getting a follow up CT scan of the chest on 01/26/2020 which showed 1. Similar appearance of left-sided treatment change, without findings of recurrent or metastatic disease. 2. Resolved left pleural effusion with mild left pleural thickening remaining. 3. Suspicion of an enlarging left breast nodule. Consider correlation with diagnostic mammogram and ultrasound. 4. Aortic Atherosclerosis (ICD10-I70.0) and Emphysema (ICD10-J43.9). 5. Age advanced coronary artery atherosclerosis. Recommend assessment of coronary risk factors and consideration of medical therapy. She had not had a mammogram in years. She had felt nothing different about her breast. And she has no family history of breast cancer.  Mammograms: [facility] on 02[facility]2020 - left breast at 2 o'clock position 9 cm from the nipple and in total measure 3.7 x 1.1 x 1.9 cm. [place] of left axilla was negative. Biopsy: Biopsy of left breast on 02/19/2020 - IDC, grade 3 Family history of breast or ovarian cancer: No On hormone therapy: No  I discussed the options for breast cancer treatment with the patient. I explained the multidisciplinary approach to breast cancer, which includes medical oncology and radiation oncology. I discussed the surgical options of lumpectomy vs. mastectomy. If mastectomy, there is the possibility of reconstruction. I discussed the options of lymph node biopsy. The treatment plan depends  on the pathologic staging of the tumor and the patient's personal wishes. The risks of surgery include, but are not limited to, bleeding, infection, the need for further surgery, and nerve injury. The patient has been given literature on the treatment of breast cancer.  Addendum Note(Deborah Zoll H. Deborah Gaskins MD; 03/14/2020 4:55 PM)  [name] was unable to tolerate the MRI (unable to breathe).   Past Medical History: 1. Left breast cancer Biopsy of left breast on 02/19/2020 - IDC, grade 3 2. Left lung cancer - diagnosed Sept 2019 - Stage IIIB (T3,N2, M0) SCCa of LLL Followed by Dr. Julien [name] 3. COPD - Sees Dr. Chase [name] On home O2 x 4 years She quit smoking in 2016 4. History of CHF 5. Age advanced coronary atherosclerosis 6. History of ventral hernia repair - 03/19/2013 - Deborah [name] 7. Choledocholithiasis - ERCP - 02/15/2017 - Deborah [name] Dr. Kieth [name] for possible gall bladder surgery on 03/15/2017 8. Morbid obesity BMI - 45.4  Social History: Husband, Deborah [name]. She has one son, 22 yo. Her sister in law, Deborah [name], was on the cell phone during the discussion of the breast cancer.  Allergies (Deborah [name], CMA; 02/23/2020 1:03 PM) No Known Drug Allergies  [03/15/2017]:  Medication History (Deborah [name], CMA; 02/23/2020 1:08 PM) Albuterol Sulfate ((2.5 MG/3ML)0.083% Nebulized Soln, Inhalation) Active. Aspirin (81MG  Tablet, Oral) Active. Budeson-Glycopyrrol-Formoterol (160-9-4.8MCG/ACT Aerosol, Inhalation) Active. Vitamin D (Cholecalciferol) (25 MCG(1000 UT) Tablet, Oral) Active. Ciclopirox Olamine (0.77% Cream, External) Active. dilTIAZem HCl ER Coated Beads (240MG  Capsule ER 24HR, Oral) Active. Furosemide (20MG  Tablet, Oral) Active. Singulair (10MG  Tablet, Oral) Active. Potassium Chloride Crys ER (20MEQ Tablet ER, Oral) Active. predniSONE (5MG  Tablet, Oral) Active. Spacer/Aero-Holding [employer]. Vitamin B12-Folic  Acid (767-341PFX Tablet, Oral) Active. Medications Reconciled  Vitals (  Deborah [name] CMA; 02/23/2020 1:08 PM) 02/23/2020 1:08 PM Weight: 256.13 lb Height: 63in Body Surface Area: 2.15 m Body Mass Index: 45.37 kg/m  Temp.: 99.68F (Temporal)  Pulse: 117 (Regular)  P.OX: 92% (Room air) BP: 110/88(Sitting, Left Arm, Standard)   Physical Exam  General: Obese WF who alert. She is wearing her O2 and struggles to get on the exam table by herself. HEENT: Normal. Pupils equal.  Neck: Supple. No mass. No thyroid mass. Lymph Nodes: No supraclavicular, cervical or axillary nodes.  Lungs: Clear to symmetric breath sounds. Bilateral rhonchi. On O2 Heart: RRR. No murmur or rub.  Breasts: Right - medium in size, no mass  Left - medium in size. Bruise in the UOQ of the left breast at about 2 o'clock. I feel a mass, but it is probably hematoma.  Abdomen: Soft. No mass. No tenderness. No hernia. Normal bowel sounds. Obese. Rectal: Not done.  Extremities: Okay strength and ROM in upper and lower extremities. I think her breathing limits her physical activity.  Neurologic: Grossly intact to motor and sensory function. Psychiatric: Has normal mood and affect. Behavior is normal.   Assessment & Plan  1. BREAST CANCER, STAGE 1, LEFT (C50.912)  Impression: Plan:   1. Pre op clearance with pulmonary (Deborah [name]) and cardiolgy (Deborah [name])  2. Consultation with radiation oncology - Deborah [name]   3. Consultation with medical oncology (I have spoken to Dr. Julien [name] and he is going to defer to one of his breast colleagues)   I spoke to Dr. Lindi [name] today - 03/09/2020   And even though neoadjuvant tx may be better from a control standpoint, we are both worried that the chemotx may make her weaker. So, unless the MRI shows something different, we'll surgery up front. I am gong to "split" the port placement and her breast surgery.  4. Anesthesia consult pre op  5. Plan left lumpectomy (seed  localization) and left axillary SLNBx  2. COPD (CHRONIC OBSTRUCTIVE PULMONARY DISEASE)   Story: Followed by Dr. Chase [name]  Pulmonary consult - Wyn Quaker - 02/23/2020  Respiratory complications generally occur in 1% of ASA Class I patients, 5% of ASA Class II and 10% of ASA Class III-IV patients These complications rarely result in mortality and iclude postoperative pneumonia, atelectasis, pulmonary embolism, ARDS and increased time requiring postoperative mechanical ventilation.  Overall, I recommend proceeding with the surgery if the risk for respiratory complications are outweighed by the potential benefits. This will need to be discussed between the patient and surgeon. 3.  CHF (CONGESTIVE HEART FAILURE)   Story: Followed by Dr. Midge Minium [facility])  Cardiac - L. Kilroy - 02/25/2020  She is considered an acceptable risk for surgery. 4.  CHOLEDOCHOLITHIASIS   Story: ERCP - 2018 Ardis Hughs  Impression: She saw Dr. Kieth [name] in 2018 for interval cholecystectomy. This was never done. 5.  CANCER OF LEFT LUNG (C34.92)  Story: Left lower lobe -  diagnosed Sept 2019 - Stage IIIB (T3,N2, M0),  SCCa of LLL  Followed by Dr. Julien [name] (he is going to defer her breast cancer treatment to one of the breast oncologist) 6.  IR Laurence Ferrari) placed right IJ power port - 03/26/2020 7. Age advanced coronary atherosclerosis 8. Morbid obesity BMI - 45.4  Alphonsa Overall, MD, [facility] Office phone:  [phone]

## 2020-04-08 ENCOUNTER — Encounter (HOSPITAL_COMMUNITY)
Admission: RE | Disposition: A | Discharge status: Home / Self Care | Payer: Self-pay | Source: Ambulatory Visit | Attending: Surgery

## 2020-04-08 ENCOUNTER — Ambulatory Visit (HOSPITAL_COMMUNITY): Payer: BLUE CROSS/BLUE SHIELD | Admitting: Vascular Surgery

## 2020-04-08 ENCOUNTER — Encounter (HOSPITAL_COMMUNITY)
Admission: RE | Admit: 2020-04-08 | Discharge: 2020-04-08 | Disposition: A | Discharge status: Home / Self Care | Payer: BLUE CROSS/BLUE SHIELD | Source: Ambulatory Visit | Attending: Surgery | Admitting: Surgery

## 2020-04-08 ENCOUNTER — Ambulatory Visit
Admission: RE | Admit: 2020-04-08 | Discharge: 2020-04-08 | Disposition: A | Discharge status: Home / Self Care | Payer: BLUE CROSS/BLUE SHIELD | Source: Ambulatory Visit | Attending: Surgery | Admitting: Surgery

## 2020-04-08 ENCOUNTER — Other Ambulatory Visit: Payer: Self-pay

## 2020-04-08 ENCOUNTER — Other Ambulatory Visit (HOSPITAL_COMMUNITY): Payer: BLUE CROSS/BLUE SHIELD

## 2020-04-08 ENCOUNTER — Encounter (HOSPITAL_COMMUNITY): Payer: Self-pay | Admitting: Surgery

## 2020-04-08 ENCOUNTER — Ambulatory Visit (HOSPITAL_COMMUNITY)
Admission: RE | Admit: 2020-04-08 | Discharge: 2020-04-08 | Disposition: A | Discharge status: Home / Self Care | Payer: BLUE CROSS/BLUE SHIELD | Source: Ambulatory Visit | Attending: Surgery | Admitting: Surgery

## 2020-04-08 DIAGNOSIS — Z7982 Long term (current) use of aspirin: Secondary | ICD-10-CM | POA: Diagnosis not present

## 2020-04-08 DIAGNOSIS — I509 Heart failure, unspecified: Secondary | ICD-10-CM | POA: Insufficient documentation

## 2020-04-08 DIAGNOSIS — I110 Hypertensive heart disease with heart failure: Secondary | ICD-10-CM | POA: Insufficient documentation

## 2020-04-08 DIAGNOSIS — R928 Other abnormal and inconclusive findings on diagnostic imaging of breast: Secondary | ICD-10-CM | POA: Diagnosis not present

## 2020-04-08 DIAGNOSIS — Z171 Estrogen receptor negative status [ER-]: Secondary | ICD-10-CM

## 2020-04-08 DIAGNOSIS — F329 Major depressive disorder, single episode, unspecified: Secondary | ICD-10-CM | POA: Insufficient documentation

## 2020-04-08 DIAGNOSIS — Z7951 Long term (current) use of inhaled steroids: Secondary | ICD-10-CM | POA: Diagnosis not present

## 2020-04-08 DIAGNOSIS — Z87891 Personal history of nicotine dependence: Secondary | ICD-10-CM | POA: Diagnosis not present

## 2020-04-08 DIAGNOSIS — C50412 Malignant neoplasm of upper-outer quadrant of left female breast: Secondary | ICD-10-CM | POA: Diagnosis not present

## 2020-04-08 DIAGNOSIS — R0602 Shortness of breath: Secondary | ICD-10-CM | POA: Diagnosis not present

## 2020-04-08 DIAGNOSIS — G4730 Sleep apnea, unspecified: Secondary | ICD-10-CM | POA: Insufficient documentation

## 2020-04-08 DIAGNOSIS — I700 Atherosclerosis of aorta: Secondary | ICD-10-CM | POA: Diagnosis not present

## 2020-04-08 DIAGNOSIS — C50912 Malignant neoplasm of unspecified site of left female breast: Secondary | ICD-10-CM

## 2020-04-08 DIAGNOSIS — J449 Chronic obstructive pulmonary disease, unspecified: Secondary | ICD-10-CM | POA: Diagnosis not present

## 2020-04-08 DIAGNOSIS — C3490 Malignant neoplasm of unspecified part of unspecified bronchus or lung: Secondary | ICD-10-CM | POA: Diagnosis not present

## 2020-04-08 DIAGNOSIS — Z6841 Body Mass Index (BMI) 40.0 and over, adult: Secondary | ICD-10-CM | POA: Insufficient documentation

## 2020-04-08 DIAGNOSIS — I272 Pulmonary hypertension, unspecified: Secondary | ICD-10-CM | POA: Insufficient documentation

## 2020-04-08 DIAGNOSIS — G8918 Other acute postprocedural pain: Secondary | ICD-10-CM | POA: Diagnosis not present

## 2020-04-08 DIAGNOSIS — I2510 Atherosclerotic heart disease of native coronary artery without angina pectoris: Secondary | ICD-10-CM | POA: Diagnosis not present

## 2020-04-08 DIAGNOSIS — Z79899 Other long term (current) drug therapy: Secondary | ICD-10-CM | POA: Diagnosis not present

## 2020-04-08 DIAGNOSIS — Z9981 Dependence on supplemental oxygen: Secondary | ICD-10-CM | POA: Insufficient documentation

## 2020-04-08 DIAGNOSIS — I10 Essential (primary) hypertension: Secondary | ICD-10-CM | POA: Diagnosis not present

## 2020-04-08 DIAGNOSIS — Z170 Estrogen receptor positive status [ER+]: Secondary | ICD-10-CM | POA: Diagnosis not present

## 2020-04-08 HISTORY — PX: BREAST LUMPECTOMY: SHX2

## 2020-04-08 HISTORY — PX: BREAST LUMPECTOMY WITH RADIOACTIVE SEED AND SENTINEL LYMPH NODE BIOPSY: SHX6550

## 2020-04-08 SURGERY — BREAST LUMPECTOMY WITH RADIOACTIVE SEED AND SENTINEL LYMPH NODE BIOPSY
Anesthesia: Regional | Site: Breast | Laterality: Left

## 2020-04-08 MED ORDER — FENTANYL CITRATE (PF) 100 MCG/2ML IJ SOLN: 25.0000 ug | Freq: Once | INTRAMUSCULAR | Status: AC

## 2020-04-08 MED ORDER — 0.9 % SODIUM CHLORIDE (POUR BTL) OPTIME
TOPICAL | Status: DC | PRN
  Administered 2020-04-08: 1000 mL

## 2020-04-08 MED ORDER — TECHNETIUM TC 99M SULFUR COLLOID FILTERED
1.0000 | Freq: Once | INTRAVENOUS | Status: AC | PRN
  Administered 2020-04-08: 1 via INTRADERMAL

## 2020-04-08 MED ORDER — IPRATROPIUM-ALBUTEROL 0.5-2.5 (3) MG/3ML IN SOLN: 3.0000 mL | Freq: Once | RESPIRATORY_TRACT | Status: DC | PRN

## 2020-04-08 MED ORDER — FENTANYL CITRATE (PF) 100 MCG/2ML IJ SOLN
INTRAMUSCULAR | Status: AC
  Administered 2020-04-08: 25 ug via INTRAVENOUS
  Filled 2020-04-08: qty 2

## 2020-04-08 MED ORDER — TRAMADOL HCL 50 MG PO TABS
50.0000 mg | ORAL_TABLET | Freq: Once | ORAL | Status: AC
  Administered 2020-04-08: 50 mg via ORAL

## 2020-04-08 MED ORDER — PROPOFOL 10 MG/ML IV BOLUS
INTRAVENOUS | Status: DC | PRN
  Administered 2020-04-08: 150 mg via INTRAVENOUS

## 2020-04-08 MED ORDER — TRAMADOL HCL 50 MG PO TABS
ORAL_TABLET | ORAL | Status: AC
  Filled 2020-04-08: qty 1

## 2020-04-08 MED ORDER — CEFAZOLIN SODIUM-DEXTROSE 2-4 GM/100ML-% IV SOLN
2.0000 g | INTRAVENOUS | Status: AC
  Administered 2020-04-08: 2 g via INTRAVENOUS
  Filled 2020-04-08: qty 100

## 2020-04-08 MED ORDER — ORAL CARE MOUTH RINSE: 15.0000 mL | Freq: Once | OROMUCOSAL | Status: AC

## 2020-04-08 MED ORDER — LACTATED RINGERS IV SOLN: INTRAVENOUS | Status: DC

## 2020-04-08 MED ORDER — TRAMADOL HCL 50 MG PO TABS: 50.0000 mg | ORAL_TABLET | Freq: Four times a day (QID) | ORAL | 0 refills | Status: DC | PRN

## 2020-04-08 MED ORDER — SODIUM CHLORIDE (PF) 0.9 % IJ SOLN
INTRAMUSCULAR | Status: AC
  Filled 2020-04-08: qty 10

## 2020-04-08 MED ORDER — FENTANYL CITRATE (PF) 100 MCG/2ML IJ SOLN
INTRAMUSCULAR | Status: AC
  Filled 2020-04-08: qty 2

## 2020-04-08 MED ORDER — CHLORHEXIDINE GLUCONATE 0.12 % MT SOLN
15.0000 mL | Freq: Once | OROMUCOSAL | Status: AC
  Administered 2020-04-08: 15 mL via OROMUCOSAL
  Filled 2020-04-08: qty 15

## 2020-04-08 MED ORDER — CHLORHEXIDINE GLUCONATE 4 % EX LIQD: 60.0000 mL | Freq: Once | CUTANEOUS | Status: DC

## 2020-04-08 MED ORDER — FENTANYL CITRATE (PF) 100 MCG/2ML IJ SOLN
INTRAMUSCULAR | Status: DC | PRN
Start: 2020-04-08 — End: 2020-04-08
  Administered 2020-04-08 (×2): 25 ug via INTRAVENOUS

## 2020-04-08 MED ORDER — BUPIVACAINE HCL (PF) 0.25 % IJ SOLN
INTRAMUSCULAR | Status: AC
  Filled 2020-04-08: qty 30

## 2020-04-08 MED ORDER — MIDAZOLAM HCL 2 MG/2ML IJ SOLN
INTRAMUSCULAR | Status: AC
  Filled 2020-04-08: qty 2

## 2020-04-08 MED ORDER — BUPIVACAINE LIPOSOME 1.3 % IJ SUSP
INTRAMUSCULAR | Status: DC | PRN
  Administered 2020-04-08: 10 mL

## 2020-04-08 MED ORDER — PROPOFOL 10 MG/ML IV BOLUS
INTRAVENOUS | Status: AC
  Filled 2020-04-08: qty 40

## 2020-04-08 MED ORDER — ACETAMINOPHEN 500 MG PO TABS
1000.0000 mg | ORAL_TABLET | ORAL | Status: AC
  Administered 2020-04-08: 1000 mg via ORAL
  Filled 2020-04-08: qty 2

## 2020-04-08 MED ORDER — BUPIVACAINE HCL (PF) 0.5 % IJ SOLN
INTRAMUSCULAR | Status: DC | PRN
  Administered 2020-04-08: 20 mL

## 2020-04-08 MED ORDER — FENTANYL CITRATE (PF) 250 MCG/5ML IJ SOLN
INTRAMUSCULAR | Status: AC
  Filled 2020-04-08: qty 5

## 2020-04-08 MED ORDER — METHYLENE BLUE 0.5 % INJ SOLN
INTRAVENOUS | Status: AC
  Filled 2020-04-08: qty 10

## 2020-04-08 MED ORDER — LIDOCAINE 2% (20 MG/ML) 5 ML SYRINGE
INTRAMUSCULAR | Status: DC | PRN
  Administered 2020-04-08: 100 mg via INTRAVENOUS

## 2020-04-08 MED ORDER — DEXAMETHASONE SODIUM PHOSPHATE 10 MG/ML IJ SOLN
INTRAMUSCULAR | Status: DC | PRN
  Administered 2020-04-08: 5 mg via INTRAVENOUS

## 2020-04-08 MED ORDER — FENTANYL CITRATE (PF) 100 MCG/2ML IJ SOLN
12.5000 ug | INTRAMUSCULAR | Status: DC | PRN
  Administered 2020-04-08 (×2): 12.5 ug via INTRAVENOUS

## 2020-04-08 MED ORDER — BUPIVACAINE HCL (PF) 0.25 % IJ SOLN
INTRAMUSCULAR | Status: DC | PRN
  Administered 2020-04-08: 30 mL

## 2020-04-08 MED ORDER — SODIUM CHLORIDE (PF) 0.9 % IJ SOLN
INTRAVENOUS | Status: DC | PRN
  Administered 2020-04-08: 1 mL via INTRAMUSCULAR

## 2020-04-08 MED ORDER — ONDANSETRON HCL 4 MG/2ML IJ SOLN
INTRAMUSCULAR | Status: DC | PRN
  Administered 2020-04-08: 4 mg via INTRAVENOUS

## 2020-04-08 SURGICAL SUPPLY — 42 items
BINDER BREAST 3XL (GAUZE/BANDAGES/DRESSINGS) ×3 IMPLANT
CANISTER SUCT 3000ML PPV (MISCELLANEOUS) IMPLANT
CHLORAPREP W/TINT 26 (MISCELLANEOUS) ×3 IMPLANT
CLIP VESOCCLUDE MED 24/CT (CLIP) ×3 IMPLANT
CLIP VESOCCLUDE SM WIDE 6/CT (CLIP) ×6 IMPLANT
COVER PROBE W GEL 5X96 (DRAPES) ×3 IMPLANT
COVER SURGICAL LIGHT HANDLE (MISCELLANEOUS) ×3 IMPLANT
DECANTER SPIKE VIAL GLASS SM (MISCELLANEOUS) IMPLANT
DERMABOND ADVANCED (GAUZE/BANDAGES/DRESSINGS) ×4
DERMABOND ADVANCED .7 DNX12 (GAUZE/BANDAGES/DRESSINGS) ×2 IMPLANT
DEVICE DUBIN SPECIMEN MAMMOGRA (MISCELLANEOUS) ×3 IMPLANT
DRAPE CHEST BREAST 15X10 FENES (DRAPES) ×3 IMPLANT
DRAPE HALF SHEET 70X43 (DRAPES) ×9 IMPLANT
DRSG PAD ABDOMINAL 8X10 ST (GAUZE/BANDAGES/DRESSINGS) ×3 IMPLANT
ELECT COATED BLADE 2.86 ST (ELECTRODE) ×3 IMPLANT
ELECT REM PT RETURN 9FT ADLT (ELECTROSURGICAL) ×3
ELECTRODE REM PT RTRN 9FT ADLT (ELECTROSURGICAL) ×1 IMPLANT
GAUZE SPONGE 4X4 12PLY STRL (GAUZE/BANDAGES/DRESSINGS) ×6 IMPLANT
GLOVE SURG SYN 7.5  E (GLOVE) ×6
GLOVE SURG SYN 7.5 E (GLOVE) ×2 IMPLANT
GOWN STRL REUS W/ TWL LRG LVL3 (GOWN DISPOSABLE) ×1 IMPLANT
GOWN STRL REUS W/ TWL XL LVL3 (GOWN DISPOSABLE) ×1 IMPLANT
GOWN STRL REUS W/TWL LRG LVL3 (GOWN DISPOSABLE) ×3
GOWN STRL REUS W/TWL XL LVL3 (GOWN DISPOSABLE) ×3
ILLUMINATOR WAVEGUIDE N/F (MISCELLANEOUS) IMPLANT
KIT BASIN OR (CUSTOM PROCEDURE TRAY) ×3 IMPLANT
KIT MARKER MARGIN INK (KITS) ×3 IMPLANT
LIGHT WAVEGUIDE WIDE FLAT (MISCELLANEOUS) IMPLANT
NEEDLE 18GX1X1/2 (RX/OR ONLY) (NEEDLE) ×3 IMPLANT
NEEDLE FILTER BLUNT 18X 1/2SAF (NEEDLE) ×2
NEEDLE FILTER BLUNT 18X1 1/2 (NEEDLE) ×1 IMPLANT
NEEDLE HYPO 25GX1X1/2 BEV (NEEDLE) ×6 IMPLANT
NS IRRIG 1000ML POUR BTL (IV SOLUTION) ×3 IMPLANT
PACK GENERAL/GYN (CUSTOM PROCEDURE TRAY) ×3 IMPLANT
PENCIL SMOKE EVACUATOR (MISCELLANEOUS) ×3 IMPLANT
SPONGE LAP 18X18 RF (DISPOSABLE) ×3 IMPLANT
STAPLER VISISTAT 35W (STAPLE) ×3 IMPLANT
SUT MNCRL AB 4-0 PS2 18 (SUTURE) ×3 IMPLANT
SUT VIC AB 3-0 SH 8-18 (SUTURE) ×9 IMPLANT
SYR CONTROL 10ML LL (SYRINGE) ×6 IMPLANT
TOWEL GREEN STERILE (TOWEL DISPOSABLE) ×3 IMPLANT
TOWEL GREEN STERILE FF (TOWEL DISPOSABLE) ×3 IMPLANT

## 2020-04-08 NOTE — Transfer of Care (Addendum)
Immediate Anesthesia Transfer of Care Note  Patient: Deborah [name]  Procedure(s) Performed: LEFT BREAST LUMPECTOMY WITH RADIOACTIVE SEED AND LEFT AXILLARY SENTINEL LYMPH NODE BIOPSY (Left Breast)  Patient Location: PACU  Anesthesia Type:GA combined with regional for post-op pain  Level of Consciousness: drowsy  Airway & Oxygen Therapy: Patient Spontanous Breathing and Patient connected to nasal cannula oxygen  Post-op Assessment: Report given to RN and Post -op Vital signs reviewed and stable  Post vital signs: Reviewed and stable  Last Vitals:  Vitals Value Taken Time  BP 142/75 04/08/20 1047  Temp 36.8 C 04/08/20 1047  Pulse 91 04/08/20 1055  Resp 20 04/08/20 1055  SpO2 98 % 04/08/20 1055  Vitals shown include unvalidated device data.  Last Pain:  Vitals:   04/08/20 1047  TempSrc:   PainSc: (P) 0-No pain      Patients Stated Pain Goal: 4 (75/30/10 4045)  Complications: No complications documented.

## 2020-04-08 NOTE — Interval H&P Note (Signed)
History and Physical Interval Note:  04/08/2020 8:15 AM  Deborah [name]  has presented today for surgery, with the diagnosis of LEFT BREAST CANCER.  The various methods of treatment have been discussed with the patient and family.   Her seed is in place.  Her husband is with her.  After consideration of risks, benefits and other options for treatment, the patient has consented to  Procedure(s) with comments: LEFT BREAST LUMPECTOMY WITH RADIOACTIVE SEED AND LEFT AXILLARY SENTINEL LYMPH NODE BIOPSY (Left) - PEC BLOCK, NEEDS ANESTHESIA CONSULT as a surgical intervention.  The patient's history has been reviewed, patient examined, no change in status, stable for surgery.  I have reviewed the patient's chart and labs.  Questions were answered to the patient's satisfaction.     Shann Medal

## 2020-04-08 NOTE — Anesthesia Postprocedure Evaluation (Signed)
Anesthesia Post Note  Patient: Deborah [name]  Procedure(s) Performed: LEFT BREAST LUMPECTOMY WITH RADIOACTIVE SEED AND LEFT AXILLARY SENTINEL LYMPH NODE BIOPSY (Left Breast)     Patient location during evaluation: PACU Anesthesia Type: Regional and General Level of consciousness: awake and alert Pain management: pain level controlled Vital Signs Assessment: post-procedure vital signs reviewed and stable Respiratory status: spontaneous breathing, nonlabored ventilation, respiratory function stable and patient connected to nasal cannula oxygen Cardiovascular status: blood pressure returned to baseline and stable Postop Assessment: no apparent nausea or vomiting Anesthetic complications: no Comments: Baseline Sats low 90s on O2. Patient is back to baseline and OK to go home from respiratory standpoint.   No complications documented.  Last Vitals:  Vitals:   04/08/20 1117 04/08/20 1132  BP: 117/66 131/76  Pulse: 89 87  Resp: 19 20  Temp:  36.7 C  SpO2: 93% 91%    Last Pain:  Vitals:   04/08/20 1117  TempSrc:   PainSc: 6                  Candra R Diem Dicocco

## 2020-04-08 NOTE — Anesthesia Procedure Notes (Signed)
Procedure Name: LMA Insertion Date/Time: 04/08/2020 8:39 AM Performed by: Imagene Riches, CRNA Pre-anesthesia Checklist: Patient identified, Emergency Drugs available, Suction available and Patient being monitored Patient Re-evaluated:Patient Re-evaluated prior to induction Oxygen Delivery Method: Circle System Utilized Preoxygenation: Pre-oxygenation with 100% oxygen Induction Type: IV induction Ventilation: Mask ventilation without difficulty LMA: LMA inserted LMA Size: 4.0 Number of attempts: 1 Airway Equipment and Method: Bite block Placement Confirmation: positive ETCO2 Tube secured with: Tape Dental Injury: Teeth and Oropharynx as per pre-operative assessment

## 2020-04-08 NOTE — Op Note (Signed)
04/08/2020  10:42 AM  PATIENT:  Deborah [name] DOB: 22-Dec-1955 MRN: [id number]  PREOP DIAGNOSIS:   LEFT BREAST CANCER  POSTOP DIAGNOSIS:    Left breast cancer, 2 o'clock position (T1, N0)  PROCEDURE:   Procedure(s):  LEFT BREAST LUMPECTOMY WITH RADIOACTIVE SEED AND LEFT AXILLARY SENTINEL LYMPH NODE BIOPSY, Injection of peri areolar area of breast with methylene blue (1.0 cc), deep sentinel lymph node biopsy  SURGEON:   Alphonsa Overall, M.D.  ANESTHESIA:   General  CRNA: Imagene Riches, CRNA  General  EBL:  100  ml  DRAINS:  none   LOCAL MEDICATIONS USED:   30 cc 1/4% marcaine, left pectoral block  SPECIMEN:   Left breast lumpectomy (6 color paint), left axillary sentinel lymph node (counts 120, background 5, faint blue)  COUNTS CORRECT:  YES  INDICATIONS FOR PROCEDURE:  Deborah [name] is a 64 y.o. (DOB: [birth year]) white female whose primary care physician is Pleas Koch, NP and comes for left breast lumpectomy and left axillary sentinel lymph node biopsy.   She has left lung cancer treated by Dr. Julien Nordmann.  She was found to have a triple negative left breast cancer and has seen Dr. [name].  Because of her significant medical problems, we are going to do her surgery first, followed by chemotherapy.  The options for breast cancer treatment have been discussed with the patient. She elected to proceed with lumpectomy and axillary sentinel lymph node.     The indications and potential complications of surgery were explained to the patient. Potential complications include, but are not limited to, bleeding, infection, the need for further surgery, and nerve injury.     She had a I131 seed placed on 04/07/2020 in her left breast at [facility].  The seed is in the 2 o'clock position of the left breast.   In the holding area, her left areola was injected with 1 millicurie of Technitium Sulfur Colloid.  OPERATIVE NOTE:   The patient was taken to operating room # 2 at [facility] where she underwent a general anesthesia  supervised by CRNA: Imagene Riches, CRNA. Her left breast and axilla were prepped with  ChloraPrep and sterilely draped.    A time-out was held and the surgical check list was reviewed.    I injected about 1.0 mL of 40% methylene blue around her left areola.   The cancer was about at the 2 o'clock position of the left breast.   It was 6 cm from the areola.  I used the Neoprobe to identify the I131 seed.  I made an incision directly over the cancer.   I tried to excise an area around the tumor of at least 1 cm.    I excised this block of breast tissue approximately 8 cm by 10 cm  in diameter.   I painted the lumpectomy specimen with the 6 color paint kit and did a specimen mammogram which confirmed the mass, clip, and the seed were all in the right position in the specimen.  The specimen was sent to pathology who called back to confirm that they have the seed and the specimen.   I then started the left deep axillary sentinel lymph node biopsy. I made an incision in the left axilla.  With her obesity, it was hard to localize the sentinel node, but I did find the node.  I found a hot area at the junction of the breast and the pectoralis major muscle, deep in the  axilla. I cut down and  identified a hot node that had counts of 120 and the background has 5 counts. The lymph node was mildly blue. I checked her internal mammary nodes and supraclavicular nodes with the neoprobe and found no other hot area. The axillary node was then sent to pathology.    I then irrigated the wound with saline. I infiltrated approximately 30 mL of 1/4% Marcaine between the incisions. I placed 4 clips to mark biopsy cavity, at 12, 3, 6, and 9 o'clock.  I then closed all the wounds in layers using 3-0 Vicryl sutures for the deep layer. At the skin, I closed the incisions with a 4-0 Monocryl suture. The incisions were then painted with Dermabond.  She had gauze placed over the wounds and  her breast placed in a breast binder.   The patient tolerated the procedure well, was transported to the recovery room in good condition. Sponge and needle count were correct at the end of the case.   Final pathology is pending.   Alphonsa Overall, MD, [facility] Pager: [phone] Office phone:  [phone]

## 2020-04-08 NOTE — Anesthesia Procedure Notes (Signed)
Anesthesia Regional Block: Pectoralis block   Pre-Anesthetic Checklist: ,, timeout performed, Correct Patient, Correct Site, Correct Laterality, Correct Procedure, Correct Position, site marked, Risks and benefits discussed,  Surgical consent,  Pre-op evaluation,  At surgeon's request and post-op pain management  Laterality: Left  Prep: chloraprep       Needles:  Injection technique: Single-shot  Needle Type: Echogenic Stimulator Needle     Needle Length: 9cm  Needle Gauge: 21     Additional Needles:   Procedures:,,,, ultrasound used (permanent image in chart),,,,  Narrative:  Start time: 04/08/2020 7:00 AM End time: 04/08/2020 7:15 AM Injection made incrementally with aspirations every 5 mL.  Performed by: Personally  Anesthesiologist: Merlinda Frederick, MD  Additional Notes: Functioning IV was confirmed and monitors applied. Sterile prep and drape,hand hygiene and sterile gloves were used.Ultrasound guidance: relevant anatomy identified, needle position confirmed, local anesthetic spread visualized around nerve(s)., vascular puncture avoided.  Image printed for medical record.  Negative aspiration and negative test dose prior to incremental administration of local anesthetic. The patient tolerated the procedure well.

## 2020-04-08 NOTE — Discharge Instructions (Signed)
[facility] - DISCHARGE INSTRUCTIONS TO PATIENT  Activity:  Lifting - No lifting more than 15 pounds for one week, then no limit                       Practice your Covid-19 protection:  Wear a mask, social distance, and wash your hands frequently  Wound Care:   Leave the incision dry for 2 days, then you may shower  Diet:  As tolerated  Follow up appointment:  Call Dr. Pollie Friar office [facility]) at [phone] for an appointment in 2 to 4 weeks.  Medications and dosages:  Resume your home medications.  You have a prescription for:  Ultram             You may also take Tylenol, ibuprofen, or Aleve for pain  Call Dr. [name] or his office  [phone]) if you have:  Temperature greater than 100.4,  Persistent nausea and vomiting,  Severe uncontrolled pain,  Redness, tenderness, or signs of infection (pain, swelling, redness, odor or green/yellow discharge around the site),  Difficulty breathing, headache or visual disturbances,  Any other questions or concerns you may have after discharge.  In an emergency, call 911 or go to an Emergency Department at a nearby hospital.

## 2020-04-09 ENCOUNTER — Encounter (HOSPITAL_COMMUNITY): Payer: Self-pay | Admitting: Surgery

## 2020-04-12 LAB — SURGICAL PATHOLOGY

## 2020-04-14 ENCOUNTER — Ambulatory Visit (INDEPENDENT_AMBULATORY_CARE_PROVIDER_SITE_OTHER): Payer: BLUE CROSS/BLUE SHIELD | Admitting: Pulmonary Disease

## 2020-04-14 ENCOUNTER — Encounter: Payer: Self-pay | Admitting: Pulmonary Disease

## 2020-04-14 ENCOUNTER — Other Ambulatory Visit: Payer: Self-pay

## 2020-04-14 VITALS — BP 134/74 | HR 117 | Temp 98.6°F

## 2020-04-14 DIAGNOSIS — J432 Centrilobular emphysema: Secondary | ICD-10-CM | POA: Diagnosis not present

## 2020-04-14 MED ORDER — IPRATROPIUM-ALBUTEROL 0.5-2.5 (3) MG/3ML IN SOLN: 3.0000 mL | RESPIRATORY_TRACT | 11 refills | Status: DC | PRN

## 2020-04-14 NOTE — Progress Notes (Signed)
@Patient  ID: Deborah [name], female    DOB: Jan 16, 1956, 64 y.o.   MRN: [id number]  Chief Complaint  Patient presents with  . Follow-up    SOB and cough with clear sputum    Referring provider: Pleas Koch, NP  HPI:    04/14/2020  - Visit  Patient in for follow-up.  Recently seen by NP.  Host of pulmonary issues.  Primarily chronic hypoxic respiratory failure most likely related to COPD.  Also with OSA.  Not adherent to CPAP.  Discussed this her concern with cleaners and certain machines mainly with risk of cancer.  I am not aware of any of this being legitimate.  She is currently using 4 L nasal cannula as prescribed.  Using triple inhaled therapy via Breztri.  Recently, last week, underwent breast lumpectomy for breast cancer.  Sounds like breathing was rough for couple days afterwards but has improved with as needed albuterol.  She request refill of as needed nebulized bronchodilator dilators which was provided.  Questionaires / Pulmonary Flowsheets:   ACT:  No flowsheet data found.  MMRC: mMRC Dyspnea Scale mMRC Score  02/23/2020 3    Epworth:  No flowsheet data found.  Tests:   FENO:  No results found for: NITRICOXIDE  PFT: PFT Results Latest Ref Rng & Units 02/18/2020 01/01/2017 06/05/2013  FVC-Pre L 1.29 1.76 -  FVC-Predicted Pre % 40 54 54  FVC-Post L 1.33 1.88 1.96  FVC-Predicted Post % 42 58 59  Pre FEV1/FVC % % 74 75 82  Post FEV1/FCV % % 75 80 81  FEV1-Pre L 0.95 1.32 1.47  FEV1-Predicted Pre % 38 52 56  FEV1-Post L 1.00 1.50 1.58  DLCO uncorrected ml/min/mmHg 11.41 14.89 13.94  DLCO UNC% % 58 64 60  DLCO corrected ml/min/mmHg 11.41 14.98 -  DLCO COR %Predicted % 58 65 -  DLVA Predicted % 108 92 96  TLC L - 4.16 4.13  TLC % Predicted % - 84 84  RV % Predicted % - 118 111    WALK:  SIX MIN WALK 04/20/2016 03/24/2015 08/26/2014 04/17/2013  Supplimental Oxygen during Test? (L/min) - No Yes No  O2 Flow Rate - - 2 -  Type - - Continuous -  2 Minute  Oxygen Saturation % 93 - - -  2 Minute HR 125 - - -  4 Minute Oxygen Saturation % 93 - - -  4 Minute HR 132 - - -  6 Minute Oxygen Saturation % 92 - - -  6 Minute HR 154 - - -  Tech Comments: - stopped after 2nd lap due to fatigue, sob Pt had c/o sob after completion of 1st lap. Pt was able to complete 3 laps on 2 L 02. -    Imaging: NM Sentinel Node Inj-No Rpt (Breast)  Result Date: 04/08/2020 Sulfur colloid was injected by the nuclear medicine technologist for melanoma sentinel node.   MM Breast Surgical Specimen  Result Date: 04/08/2020 CLINICAL DATA:  Status post left lumpectomy. EXAM: SPECIMEN RADIOGRAPH OF THE LEFT BREAST COMPARISON:  Previous exam(s). FINDINGS: Status post excision of the left breast. The radioactive seed and biopsy marker clip are present, completely intact, and were marked for pathology. IMPRESSION: Specimen radiograph of the left breast. Electronically Signed   By: Lovey Newcomer M.D.   On: 04/08/2020 09:32   MM LT RADIOACTIVE SEED LOC MAMMO GUIDE  Result Date: 04/07/2020 CLINICAL DATA:  64 year old female for radioactive seed localization of LEFT breast cancer prior to  lumpectomy. EXAM: MAMMOGRAPHIC GUIDED RADIOACTIVE SEED LOCALIZATION OF THE LEFT BREAST COMPARISON:  Previous exam(s). FINDINGS: Patient presents for radioactive seed localization prior to LEFT lumpectomy. I met with the patient and we discussed the procedure of seed localization including benefits and alternatives. We discussed the high likelihood of a successful procedure. We discussed the risks of the procedure including infection, bleeding, tissue injury and further surgery. We discussed the low dose of radioactivity involved in the procedure. Informed, written consent was given. The usual time-out protocol was performed immediately prior to the procedure. Using mammographic guidance, sterile technique, 1% lidocaine and an I-125 radioactive seed, the RIBBON clip and calcifications were localized using a  SUPERIOR approach. The follow-up mammogram images confirm the seed in the expected location and were marked for Dr. [name]. Calcifications extend 1 cm POSTERIOR, 1 cm SUPERIOR and 1.5 cm MEDIAL to the seed. The mass component of the cancer extends 1 cm LATERAL and 1 cm INFERIOR to the seed. Follow-up survey of the patient confirms presence of the radioactive seed. Order number of I-125 seed:  [id number]. Total activity:  6.759 millicuries.  Reference Date: 03/23/2020. The patient tolerated the procedure well and was [facility]. She was given instructions regarding seed removal. IMPRESSION: Radioactive seed localization LEFT breast. Extent of calcifications and mass as described above. No apparent complications. Electronically Signed   By: Margarette Canada M.D.   On: 04/07/2020 14:56   IR IMAGING GUIDED PORT INSERTION  Result Date: 03/26/2020 INDICATION: Prior history of lung cancer now with new diagnosis of left-sided stage I breast cancer. She requires placement of a port catheter for planned chemotherapy. EXAM: IMPLANTED PORT A CATH PLACEMENT WITH ULTRASOUND AND FLUOROSCOPIC GUIDANCE MEDICATIONS: 2 g Ancef; The antibiotic was administered within an appropriate time interval prior to skin puncture. ANESTHESIA/SEDATION: Versed 1 mg IV; Fentanyl 50 mcg IV; Moderate Sedation Time:  23 minutes The patient was continuously monitored during the procedure by the interventional radiology nurse under my direct supervision. FLUOROSCOPY TIME:  0 minutes, 12 seconds (2 mGy) COMPLICATIONS: None immediate. PROCEDURE: The right neck and chest was prepped with chlorhexidine, and draped in the usual sterile fashion using maximum barrier technique (cap and mask, sterile gown, sterile gloves, large sterile sheet, hand hygiene and cutaneous antiseptic). Local anesthesia was attained by infiltration with 1% lidocaine with epinephrine. Ultrasound demonstrated patency of the right internal jugular vein, and this was  documented with an image. Under real-time ultrasound guidance, this vein was accessed with a 21 gauge micropuncture needle and image documentation was performed. A small dermatotomy was made at the access site with an 11 scalpel. A 0.018" wire was advanced into the SVC and the access needle exchanged for a 56F micropuncture vascular sheath. The 0.018" wire was then removed and a 0.035" wire advanced into the IVC. An appropriate location for the subcutaneous reservoir was selected below the clavicle and an incision was made through the skin and underlying soft tissues. The subcutaneous tissues were then dissected using a combination of blunt and sharp surgical technique and a pocket was formed. A single lumen power injectable portacatheter was then tunneled through the subcutaneous tissues from the pocket to the dermatotomy and the port reservoir placed within the subcutaneous pocket. The venous access site was then serially dilated and a peel away vascular sheath placed over the wire. The wire was removed and the port catheter advanced into position under fluoroscopic guidance. The catheter tip is positioned in the superior cavoatrial junction. This was documented  with a spot image. The portacatheter was then tested and found to flush and aspirate well. The port was flushed with saline followed by 100 units/mL heparinized saline. The pocket was then closed in two layers using first subdermal inverted interrupted absorbable sutures followed by a running subcuticular suture. The epidermis was then sealed with Dermabond. The dermatotomy at the venous access site was also closed with Dermabond. IMPRESSION: Successful placement of a right IJ approach Power Port with ultrasound and fluoroscopic guidance. The catheter is ready for use. Electronically Signed   By: Jacqulynn Cadet M.D.   On: 03/26/2020 14:52    Lab Results: Personally reviewed CBC    Component Value Date/Time   WBC 10.3 04/02/2020 1400   RBC 5.22  (H) 04/02/2020 1400   HGB 14.3 04/02/2020 1400   HGB 12.6 01/26/2020 1019   HCT 48.5 (H) 04/02/2020 1400   PLT 293 04/02/2020 1400   PLT 280 01/26/2020 1019   MCV 92.9 04/02/2020 1400   MCH 27.4 04/02/2020 1400   MCHC 29.5 (L) 04/02/2020 1400   RDW 15.2 04/02/2020 1400   LYMPHSABS 1.3 01/26/2020 1019   MONOABS 0.6 01/26/2020 1019   EOSABS 0.1 01/26/2020 1019   BASOSABS 0.0 01/26/2020 1019    BMET    Component Value Date/Time   NA 139 04/02/2020 1400   NA 143 11/23/2016 1504   K 4.0 04/02/2020 1400   CL 95 (L) 04/02/2020 1400   CO2 32 04/02/2020 1400   GLUCOSE 108 (H) 04/02/2020 1400   BUN 11 04/02/2020 1400   BUN 14 11/23/2016 1504   CREATININE 0.87 04/02/2020 1400   CREATININE 0.77 01/26/2020 1019   CALCIUM 9.3 04/02/2020 1400   GFRNONAA >60 04/02/2020 1400   GFRNONAA >60 01/26/2020 1019   GFRAA >60 04/02/2020 1400   GFRAA >60 01/26/2020 1019    BNP    Component Value Date/Time   BNP 166.0 (H) 12/18/2015 0003    ProBNP No results found for: PROBNP  Specialty Problems      Pulmonary Problems   Chronic respiratory failure (HCC)    Followed in Pulmonary clinic/ [facility]/ [place] - 04/17/2013  Walked RA x 1 laps @ 185 ft each stopped due to  desat 86% rapid pace  From resting sat 94%      Dyspnea    Followed in Pulmonary clinic/ [facility]/ [place] - hfa 75% p coaching 04/17/2013  - PFT's 06/05/2013 no airflow obst, dlco 60 corrects to 96%      CRLD (chronic restrictive lung disease) secondary to super morbid obesity with mild COPD component   OSA (obstructive sleep apnea)    Auto CPAP 5-10  2 L oxygen blended      COPD exacerbation (HCC)   COPD (chronic obstructive pulmonary disease) (HCC)   Primary malignant neoplasm of bronchus of left lower lobe (HCC)      No Known Allergies  Immunization History  Administered Date(s) Administered  . Influenza,inj,Quad PF,6+ Mos 04/09/2013, 07/27/2014, 04/18/2016, 05/28/2017, 04/03/2018, 04/08/2019   . PFIZER SARS-COV-2 Vaccination 09/28/2019, 10/18/2019  . Pneumococcal Conjugate-13 01/01/2017  . Pneumococcal Polysaccharide-23 07/27/2014  . Tdap 11/12/2017  . Zoster Recombinat (Shingrix) 11/12/2019, 01/14/2020    Past Medical History:  Diagnosis Date  . Anemia    as teen  . Asthma   . Breast cancer ([place])    left breast cancer 02/2020  . CHF (congestive heart failure) ([place])   . COPD, mild ([place])   . Depression   . Diverticulitis   .  Family history of anesthesia complication    vomiting  . GI bleeding   . Heart failure ([place])    New onset 07/25/14  . Histoplasmosis    left eye  . Hyperkalemia   . Hypertension   . Lung cancer ([place]) 05/02/2018   s/p chemoradiation, immunotherapy  . Obesity (BMI 30-39.9)   . Pneumonia    dx wtih pneumonia on 05/27/16- seen by [facility]   . PONV (postoperative nausea and vomiting)   . Restrictive lung disease   . Shortness of breath    with exertion   . Sleep apnea    mask and oxygen at nite for sleep at 2L   . Tobacco abuse   . Umbilical hernia     Tobacco History: Social History   Tobacco Use  Smoking Status Former Smoker  . Packs/day: 3.00  . Years: 43.00  . Pack years: 129.00  . Types: Cigarettes  . Quit date: 12/17/2012  . Years since quitting: 7.3  Smokeless Tobacco Never Used   Counseling given: Not Answered   Continue to not smoke  Outpatient Encounter Medications as of 04/14/2020  Medication Sig  . albuterol (VENTOLIN HFA) 108 (90 Base) MCG/ACT inhaler Inhale 2 puffs into the lungs every 6 (six) hours as needed for wheezing or shortness of breath.  Marland Kitchen aspirin 81 MG chewable tablet Chew 1 tablet (81 mg total) by mouth daily.  . Aspirin-Caffeine (BC FAST PAIN RELIEF ARTHRITIS PO) Take 1 packet by mouth daily as needed (back pain).  . Budeson-Glycopyrrol-Formoterol (BREZTRI AEROSPHERE) 160-9-4.8 MCG/ACT AERO Inhale 2 puffs into the lungs daily. (Patient taking differently: Inhale 2 puffs into the lungs 2 (two) times daily.  )  . diltiazem (CARDIZEM CD) 240 MG 24 hr capsule TAKE 1 CAPSULE BY MOUTH EVERY DAY (Patient taking differently: Take 240 mg by mouth daily. )  . furosemide (LASIX) 20 MG tablet Take 2 tablets (40 mg total) by mouth daily.  . montelukast (SINGULAIR) 10 MG tablet Take 1 tablet (10 mg total) by mouth at bedtime.  . OXYGEN Inhale 4 L/min into the lungs continuous.   . potassium chloride SA (KLOR-CON M20) 20 MEQ tablet TAKE 1/2 TABLET BY MOUTH EVERY DAY (Patient taking differently: Take 10 mEq by mouth daily. )  . Spacer/Aero-Holding Chambers (AEROCHAMBER MV) inhaler Use as instructed  . traMADol (ULTRAM) 50 MG tablet Take 1 tablet (50 mg total) by mouth every 6 (six) hours as needed for moderate pain or severe pain.  Marland Kitchen ipratropium-albuterol (DUONEB) 0.5-2.5 (3) MG/3ML SOLN Take 3 mLs by nebulization every 3 (three) hours as needed.   No facility-administered encounter medications on file as of 04/14/2020.     Review of Systems  Review of Systems  No chest pain.  Sleeps well.  Comprehensive review of systems otherwise negative Physical Exam  BP 134/74   Pulse (!) 117   Temp 98.6 F (37 C) (Oral)   SpO2 98%   Wt Readings from Last 5 Encounters:  04/08/20 240 lb 15.4 oz (109.3 kg)  04/02/20 241 lb (109.3 kg)  03/26/20 255 lb (115.7 kg)  02/23/20 256 lb 3.2 oz (116.2 kg)  01/28/20 255 lb (115.7 kg)    BMI Readings from Last 5 Encounters:  04/08/20 44.07 kg/m  04/02/20 44.08 kg/m  03/26/20 46.64 kg/m  03/08/20 46.86 kg/m  02/23/20 46.86 kg/m     Physical Exam General: Chronically ill-appearing, in wheelchair, no acute distress Neck: Habitus precludes JVP evaluation, supple Eyes: EOMI, no icterus Respiratory: Distant breath  sounds, focal wheezing left upper lung field, otherwise clear to auscultation Abdomen: Obese, bowel sounds present MSK: No synovitis, no joint effusion Neuro: No weakness, no sensation deficit Psych: Normal mood, full affect   Assessment & Plan:    Severe COPD: Recent PFTs with FEV1 just over 30% after bronchodilator.  No significant bronchodilator response.  On maximal inhaler therapy.  Provided prescription for DuoNebs as needed.  Chronic hypoxic respiratory failure: Most likely related to severe COPD.  Has some cardiovascular issues as well.  To continue 4 L as prescribed.  Would love to pursue point rehab with patient but with upcoming radiation scheduled for breast cancer this cannot be done at this time.  Recommend graduated exercise program at home.  Hopeful she will do this with husband.  Obstructive sleep apnea: Has not used CPAP in some time.  Has many concerns about cancer risk with various machines.  I am not aware of any legitimate concerns in this regard.  Agreed to get through the next couple months with radiation therapy given so much going on.  We will rediscuss at next visit.  May benefit from a consult from one of our physicians in sleep medicine.   Return in about 4 months (around 08/14/2020).   Lanier Clam, MD 04/14/2020

## 2020-04-14 NOTE — Patient Instructions (Signed)
Nice to meet you  Continue the [place] with the spacer.  I sent a new script for duoneb nebulizer to be used every 3 hours as needed. Do not use more than 3 times in a day. If need it more than that please call us and your PCP for directions.   Call [facility] and ask about a portable concentrator for 4 liters of oxygen.   Come back in 4 months with Dr. Silas Flood.   We will discuss sleep apnea more then.

## 2020-04-16 ENCOUNTER — Encounter: Payer: Self-pay | Admitting: *Deleted

## 2020-04-18 NOTE — Progress Notes (Signed)
Patient Care Team: Pleas Koch, NP as PCP - General (Internal Medicine) [name], MD as PCP - Cardiology (Cardiology) [name], MD (Inactive) (Family Medicine) [name], MD as Consulting Physician (Cardiology) Rockwell Germany, RN as Oncology Nurse Navigator Mauro Kaufmann, RN as Oncology Nurse Navigator  DIAGNOSIS:    ICD-10-CM   1. Malignant neoplasm of upper-outer quadrant of left breast in female, estrogen receptor negative ([place])  C50.412    Z17.1     SUMMARY OF ONCOLOGIC HISTORY: Oncology History  Primary malignant neoplasm of bronchus of left lower lobe ([place])  05/02/2018 Initial Diagnosis   Stage III squamous cell carcinoma of left lung ([place])   05/13/2018 - 06/24/2018 Chemotherapy   The patient had palonosetron (ALOXI) injection 0.25 mg, 0.25 mg, Intravenous,  Once, 7 of 7 cycles Administration: 0.25 mg (05/13/2018), 0.25 mg (06/10/2018), 0.25 mg (06/17/2018), 0.25 mg (05/20/2018), 0.25 mg (06/24/2018), 0.25 mg (05/27/2018), 0.25 mg (06/03/2018) CARBOplatin (PARAPLATIN) 300 mg in sodium chloride 0.9 % 250 mL chemo infusion, 300 mg (100 % of original dose 300 mg), Intravenous,  Once, 7 of 7 cycles Dose modification: 300 mg (original dose 300 mg, Cycle 1) Administration: 300 mg (05/13/2018), 300 mg (06/10/2018), 260 mg (06/17/2018), 300 mg (05/20/2018), 300 mg (06/24/2018), 300 mg (05/27/2018), 300 mg (06/03/2018) PACLitaxel (TAXOL) 96 mg in sodium chloride 0.9 % 250 mL chemo infusion (</= 53m/m2), 45 mg/m2 = 96 mg, Intravenous,  Once, 7 of 7 cycles Administration: 96 mg (05/13/2018), 96 mg (06/10/2018), 96 mg (06/17/2018), 96 mg (05/20/2018), 96 mg (06/24/2018), 96 mg (05/27/2018), 96 mg (06/03/2018)  for chemotherapy treatment.    07/31/2018 -  Chemotherapy   The patient had durvalumab (IMFINZI) 1,000 mg in sodium chloride 0.9 % 100 mL chemo infusion, 1,020 mg, Intravenous,  Once, 26 of 26 cycles Administration: 1,000 mg (07/31/2018), 1,000 mg (10/22/2018),  1,000 mg (11/05/2018), 1,000 mg (11/19/2018), 1,000 mg (12/03/2018), 1,000 mg (08/14/2018), 1,000 mg (12/17/2018), 1,000 mg (08/27/2018), 1,000 mg (09/10/2018), 1,000 mg (09/24/2018), 1,000 mg (10/11/2018), 1,000 mg (12/31/2018), 1,000 mg (01/14/2019), 1,120 mg (01/28/2019), 1,120 mg (02/11/2019), 1,120 mg (02/25/2019), 1,120 mg (03/11/2019), 1,120 mg (03/25/2019), 1,120 mg (04/08/2019), 1,120 mg (04/22/2019), 1,120 mg (05/06/2019), 1,120 mg (05/20/2019), 1,120 mg (06/03/2019), 1,120 mg (06/17/2019), 1,120 mg (07/01/2019), 1,120 mg (07/15/2019)  for chemotherapy treatment.    Malignant neoplasm of upper-outer quadrant of left breast in female, estrogen receptor negative ([place]  02/19/2020 Cancer Staging   Staging form: Breast, AJCC 8th Edition - Clinical stage from 02/19/2020: Stage IIB (cT2, cN0, cM0, G3, ER-, PR-, HER2-) - Signed by CGardenia Phlegm NP on 02/25/2020   02/19/2020 Initial Diagnosis   Chest CT for lung cancer follow-up showed a left breast module. Mammogram and UKoreashowed a 3.7cm mass with calcifications at the 2 o'clock position in the left breast, no left axillary adenopathy. Biopsy showed IDC, grade 3, HER-2 negative (0), ER/PR negative, Ki67 40%.   04/08/2020 Surgery   Left lumpectomy ([name]: three foci of IDC, grade 3, 3.3cm, 1.0cm, 0.7cm, with high grade DCIS, clear margins, 2 left axillary lymph nodes negative for carcinoma.      CHIEF COMPLIANT: Follow-up s/p left lumpectomy   INTERVAL HISTORY: MMIKYA DONis a 64y.o. with above-mentioned history of triple negative left breast. Deborah [name] underwent a left lumpectomy on 04/08/20 with Dr. NLucia Gaskinsfor which pathology showed three foci of invasive ductal carcinoma, grade 3, 3.3cm, 1.0cm, 0.7cm, with high grade DCIS, clear margins, 2 left axillary lymph nodes  negative for carcinoma. Deborah [name] presents to the clinic today to review the pathology report and discuss further treatment.   ALLERGIES:  has No Known Allergies.  MEDICATIONS:  Current Outpatient  Medications  Medication Sig Dispense Refill   albuterol (VENTOLIN HFA) 108 (90 Base) MCG/ACT inhaler Inhale 2 puffs into the lungs every 6 (six) hours as needed for wheezing or shortness of breath. 8 g 3   aspirin 81 MG chewable tablet Chew 1 tablet (81 mg total) by mouth daily. 30 tablet 1   Aspirin-Caffeine (BC FAST PAIN RELIEF ARTHRITIS PO) Take 1 packet by mouth daily as needed (back pain).     Budeson-Glycopyrrol-Formoterol (BREZTRI AEROSPHERE) 160-9-4.8 MCG/ACT AERO Inhale 2 puffs into the lungs daily. (Patient taking differently: Inhale 2 puffs into the lungs 2 (two) times daily. ) 5.9 g 0   diltiazem (CARDIZEM CD) 240 MG 24 hr capsule TAKE 1 CAPSULE BY MOUTH EVERY DAY (Patient taking differently: Take 240 mg by mouth daily. ) 90 capsule 2   furosemide (LASIX) 20 MG tablet Take 2 tablets (40 mg total) by mouth daily. 180 tablet 1   ipratropium-albuterol (DUONEB) 0.5-2.5 (3) MG/3ML SOLN Take 3 mLs by nebulization every 3 (three) hours as needed. 360 mL 11   montelukast (SINGULAIR) 10 MG tablet Take 1 tablet (10 mg total) by mouth at bedtime. 90 tablet 2   OXYGEN Inhale 4 L/min into the lungs continuous.      potassium chloride SA (KLOR-CON M20) 20 MEQ tablet TAKE 1/2 TABLET BY MOUTH EVERY DAY (Patient taking differently: Take 10 mEq by mouth daily. ) 45 tablet 1   Spacer/Aero-Holding Chambers (AEROCHAMBER MV) inhaler Use as instructed 1 each 0   traMADol (ULTRAM) 50 MG tablet Take 1 tablet (50 mg total) by mouth every 6 (six) hours as needed for moderate pain or severe pain. 15 tablet 0   No current facility-administered medications for this visit.    PHYSICAL EXAMINATION: ECOG PERFORMANCE STATUS: 1 - Symptomatic but completely ambulatory  Vitals:   04/19/20 1413  BP: 99/63  Pulse: 88  Resp: 18  Temp: 97.6 F (36.4 C)  SpO2: 95%   Filed Weights   04/19/20 1413  Weight: 243 lb 4.8 oz (110.4 kg)    LABORATORY DATA:  I have reviewed the data as listed CMP Latest  Ref Rng & Units 04/02/2020 01/26/2020 01/14/2020  Glucose 70 - 99 mg/dL 108(H) 99 114(H)  BUN 8 - 23 mg/dL 11 15 18   Creatinine 0.44 - 1.00 mg/dL 0.87 0.77 0.80  Sodium 135 - 145 mmol/L 139 143 139  Potassium 3.5 - 5.1 mmol/L 4.0 3.9 3.8  Chloride 98 - 111 mmol/L 95(L) 105 98  CO2 22 - 32 mmol/L 32 27 32  Calcium 8.9 - 10.3 mg/dL 9.3 9.2 9.3  Total Protein 6.5 - 8.1 g/dL - 7.6 7.9  Total Bilirubin 0.3 - 1.2 mg/dL - <0.2(L) 0.2  Alkaline Phos 38 - 126 U/L - 88 81  AST 15 - 41 U/L - 22 26  ALT 0 - 44 U/L - 39 38(H)    Lab Results  Component Value Date   WBC 10.3 04/02/2020   HGB 14.3 04/02/2020   HCT 48.5 (H) 04/02/2020   MCV 92.9 04/02/2020   PLT 293 04/02/2020   NEUTROABS 7.8 (H) 01/26/2020    ASSESSMENT & PLAN:  Malignant neoplasm of upper-outer quadrant of left breast in female, estrogen receptor negative ([place]) 04/08/20: Left lumpectomy [name]): three foci of IDC, grade 3, 3.3cm, 1.0cm, 0.7cm, with  high grade DCIS, clear margins, 2 left axillary lymph nodes negative for carcinoma. HER-2 negative (0), ER/PR negative, Ki67 40%.  Pathology counseling: I discussed the final pathology report of the patient provided  a copy of this report. I discussed the margins as well as lymph node surgeries. We also discussed the final staging along with previously performed ER/PR and HER-2/neu testing.  Treatment Plan: 1. Adjuvant chemotherapy with Adriamycin and Cytoxan dose dense 4 followed by Taxol weekly 12 2. Followed by adjuvant radiation therapy  If Deborah [name] cannot tolerate the above chemotherapy then we will consider switching her to CMF. Patient has 24-hour oxygen and uses a wheelchair for ambulation. I am worried about her tolerability to chemotherapy. However the patient wants to be extremely aggressive and wants to try the standard of care and if Deborah [name] cannot tolerate it then Deborah [name] is willing to consider a less aggressive  option. -------------------------------------------------------------------------------------------------------------------------------------------- Echocardiogram: Already done on 01/27/2020: EF greater than 55%  Start chemo in 3 weeks Counseled her about chemotherapy once again Deborah [name] wants to stay aggressive and receive Adriamycin Cytoxan. Return to clinic to start chemo.   No orders of the defined types were placed in this encounter.  The patient has a good understanding of the overall plan. Deborah [name] agrees with it. Deborah [name] will call with any problems that may develop before the next visit here.  Total time spent: 45 mins including face to face time and time spent for planning, charting and coordination of care  Nicholas Lose, MD 04/19/2020  I, Cloyde Reams Dorshimer, am acting as scribe for Dr. Nicholas Lose.  I have reviewed the above documentation for accuracy and completeness, and I agree with the above.

## 2020-04-19 ENCOUNTER — Other Ambulatory Visit: Payer: Self-pay

## 2020-04-19 ENCOUNTER — Encounter: Payer: Self-pay | Admitting: *Deleted

## 2020-04-19 ENCOUNTER — Inpatient Hospital Stay: Payer: BLUE CROSS/BLUE SHIELD | Attending: Internal Medicine | Admitting: Hematology and Oncology

## 2020-04-19 VITALS — BP 99/63 | HR 88 | Temp 97.6°F | Resp 18 | Ht 62.0 in | Wt 243.3 lb

## 2020-04-19 DIAGNOSIS — Z171 Estrogen receptor negative status [ER-]: Secondary | ICD-10-CM | POA: Diagnosis not present

## 2020-04-19 DIAGNOSIS — Z5189 Encounter for other specified aftercare: Secondary | ICD-10-CM | POA: Diagnosis not present

## 2020-04-19 DIAGNOSIS — C3432 Malignant neoplasm of lower lobe, left bronchus or lung: Secondary | ICD-10-CM | POA: Diagnosis not present

## 2020-04-19 DIAGNOSIS — Z5111 Encounter for antineoplastic chemotherapy: Secondary | ICD-10-CM | POA: Insufficient documentation

## 2020-04-19 DIAGNOSIS — C50412 Malignant neoplasm of upper-outer quadrant of left female breast: Secondary | ICD-10-CM | POA: Insufficient documentation

## 2020-04-19 MED ORDER — LIDOCAINE-PRILOCAINE 2.5-2.5 % EX CREA: TOPICAL_CREAM | CUTANEOUS | 3 refills | Status: DC

## 2020-04-19 MED ORDER — ONDANSETRON HCL 8 MG PO TABS: 8.0000 mg | ORAL_TABLET | Freq: Two times a day (BID) | ORAL | 1 refills | Status: DC | PRN

## 2020-04-19 MED ORDER — PROCHLORPERAZINE MALEATE 10 MG PO TABS: 10.0000 mg | ORAL_TABLET | Freq: Four times a day (QID) | ORAL | 1 refills | Status: DC | PRN

## 2020-04-19 NOTE — Progress Notes (Addendum)
ALERT: Recent [facility] Treatment decision is outdated. Please await next [facility] decision 

## 2020-04-19 NOTE — Assessment & Plan Note (Signed)
04/08/20: Left lumpectomy Deborah [name]): three foci of IDC, grade 3, 3.3cm, 1.0cm, 0.7cm, with high grade DCIS, clear margins, 2 left axillary lymph nodes negative for carcinoma. HER-2 negative (0), ER/PR negative, Ki67 40%.  Pathology counseling: I discussed the final pathology report of the patient provided  a copy of this report. I discussed the margins as well as lymph node surgeries. We also discussed the final staging along with previously performed ER/PR and HER-2/neu testing.  Treatment Plan: 1. Adjuvant chemotherapy with Adriamycin and Cytoxan dose dense 4 followed by Taxol weekly 12 2. Followed by adjuvant radiation therapy  If she cannot tolerate the above chemotherapy then we will consider switching her to CMF. Patient has 24-hour oxygen and uses a wheelchair for ambulation. I am worried about her tolerability to chemotherapy. However the patient wants to be extremely aggressive and wants to try the standard of care and if she cannot tolerate it then she is willing to consider a less aggressive option. -------------------------------------------------------------------------------------------------------------------------------------------- Echocardiogram: Already done on 01/27/2020: EF greater than 55%  Start chemo in

## 2020-04-20 ENCOUNTER — Telehealth: Payer: Self-pay | Admitting: Hematology and Oncology

## 2020-04-20 NOTE — Telephone Encounter (Signed)
Scheduled appt per 10/4 sch msg - pt is aware of apt

## 2020-04-22 ENCOUNTER — Encounter: Payer: Self-pay | Admitting: *Deleted

## 2020-04-22 ENCOUNTER — Telehealth: Payer: Self-pay | Admitting: Hematology and Oncology

## 2020-04-22 NOTE — Telephone Encounter (Signed)
Scheduled per 10/4 los. Pt will receive an updated appt calendar at next visit per appt notes

## 2020-05-03 ENCOUNTER — Other Ambulatory Visit: Payer: Self-pay

## 2020-05-03 ENCOUNTER — Inpatient Hospital Stay: Payer: BLUE CROSS/BLUE SHIELD

## 2020-05-03 ENCOUNTER — Other Ambulatory Visit: Payer: Self-pay | Admitting: Primary Care

## 2020-05-03 DIAGNOSIS — I272 Pulmonary hypertension, unspecified: Secondary | ICD-10-CM

## 2020-05-03 DIAGNOSIS — I5032 Chronic diastolic (congestive) heart failure: Secondary | ICD-10-CM

## 2020-05-04 NOTE — Progress Notes (Signed)
.  The following biosimilar Udenyca (pegfilgrastim-cbqv) has been selected for use in this patient due to insurance.

## 2020-05-05 NOTE — Progress Notes (Signed)
Pharmacist Chemotherapy Monitoring - Initial Assessment    Anticipated start date: 05/11/20   Regimen:  . Are orders appropriate based on the patient's diagnosis, regimen, and cycle? Yes . Does the plan date match the patient's scheduled date? Yes . Is the sequencing of drugs appropriate? Yes . Are the premedications appropriate for the patient's regimen? No . Prior Authorization for treatment is: Pending o If applicable, is the correct biosimilar selected based on the patient's insurance? not applicable  Organ Function and Labs: Marland Kitchen Are dose adjustments needed based on the patient's renal function, hepatic function, or hematologic function? Yes . Are appropriate labs ordered prior to the start of patient's treatment? Yes . Other organ system assessment, if indicated: anthracyclines: Echo/ MUGA . The following baseline labs, if indicated, have been ordered: N/A  Dose Assessment: . Are the drug doses appropriate? Yes . Are the following correct: o Drug concentrations Yes o IV fluid compatible with drug Yes o Administration routes Yes o Timing of therapy Yes . If applicable, does the patient have documented access for treatment and/or plans for port-a-cath placement? not applicable . If applicable, have lifetime cumulative doses been properly documented and assessed? not applicable Lifetime Dose Tracking  . Carboplatin: 2,060 mg = 0.01 % of the maximum lifetime dose of 999,999,999 mg  o   Toxicity Monitoring/Prevention: . The patient has the following take home antiemetics prescribed: Ondansetron and Prochlorperazine . The patient has the following take home medications prescribed: N/A . Medication allergies and previous infusion related reactions, if applicable, have been reviewed and addressed. Yes . The patient's current medication list has been assessed for drug-drug interactions with their chemotherapy regimen. no significant drug-drug interactions were identified on  review.  Order Review: . Are the treatment plan orders signed? No . Is the patient scheduled to see a provider prior to their treatment? Yes  I verify that I have reviewed each item in the above checklist and answered each question accordingly.  Shanette Tamargo K 05/05/2020 2:37 PM

## 2020-05-07 ENCOUNTER — Encounter: Payer: Self-pay | Admitting: *Deleted

## 2020-05-10 NOTE — Progress Notes (Signed)
Patient Care Team: Pleas Koch, NP as PCP - General (Internal Medicine) [name], MD as PCP - Cardiology (Cardiology) [name], MD (Inactive) (Family Medicine) [name], MD as Consulting Physician (Cardiology) Rockwell Germany, RN as Oncology Nurse Navigator Mauro Kaufmann, RN as Oncology Nurse Navigator  DIAGNOSIS:    ICD-10-CM   1. Malignant neoplasm of upper-outer quadrant of left breast in female, estrogen receptor negative ([place])  C50.412    Z17.1     SUMMARY OF ONCOLOGIC HISTORY: Oncology History  Primary malignant neoplasm of bronchus of left lower lobe ([place])  05/02/2018 Initial Diagnosis   Stage III squamous cell carcinoma of left lung ([place])   05/13/2018 - 06/24/2018 Chemotherapy   The patient had palonosetron (ALOXI) injection 0.25 mg, 0.25 mg, Intravenous,  Once, 7 of 7 cycles Administration: 0.25 mg (05/13/2018), 0.25 mg (06/10/2018), 0.25 mg (06/17/2018), 0.25 mg (05/20/2018), 0.25 mg (06/24/2018), 0.25 mg (05/27/2018), 0.25 mg (06/03/2018) CARBOplatin (PARAPLATIN) 300 mg in sodium chloride 0.9 % 250 mL chemo infusion, 300 mg (100 % of original dose 300 mg), Intravenous,  Once, 7 of 7 cycles Dose modification: 300 mg (original dose 300 mg, Cycle 1) Administration: 300 mg (05/13/2018), 300 mg (06/10/2018), 260 mg (06/17/2018), 300 mg (05/20/2018), 300 mg (06/24/2018), 300 mg (05/27/2018), 300 mg (06/03/2018) PACLitaxel (TAXOL) 96 mg in sodium chloride 0.9 % 250 mL chemo infusion (</= 95m/m2), 45 mg/m2 = 96 mg, Intravenous,  Once, 7 of 7 cycles Administration: 96 mg (05/13/2018), 96 mg (06/10/2018), 96 mg (06/17/2018), 96 mg (05/20/2018), 96 mg (06/24/2018), 96 mg (05/27/2018), 96 mg (06/03/2018)  for chemotherapy treatment.    07/31/2018 - 07/15/2019 Chemotherapy   The patient had durvalumab (IMFINZI) 1,000 mg in sodium chloride 0.9 % 100 mL chemo infusion, 1,020 mg, Intravenous,  Once, 26 of 26 cycles Administration: 1,000 mg (07/31/2018), 1,000 mg  (10/22/2018), 1,000 mg (11/05/2018), 1,000 mg (11/19/2018), 1,000 mg (12/03/2018), 1,000 mg (08/14/2018), 1,000 mg (12/17/2018), 1,000 mg (08/27/2018), 1,000 mg (09/10/2018), 1,000 mg (09/24/2018), 1,000 mg (10/11/2018), 1,000 mg (12/31/2018), 1,000 mg (01/14/2019), 1,120 mg (01/28/2019), 1,120 mg (02/11/2019), 1,120 mg (02/25/2019), 1,120 mg (03/11/2019), 1,120 mg (03/25/2019), 1,120 mg (04/08/2019), 1,120 mg (04/22/2019), 1,120 mg (05/06/2019), 1,120 mg (05/20/2019), 1,120 mg (06/03/2019), 1,120 mg (06/17/2019), 1,120 mg (07/01/2019), 1,120 mg (07/15/2019)  for chemotherapy treatment.    05/11/2020 -  Chemotherapy   The patient had DOXOrubicin (ADRIAMYCIN) chemo injection 132 mg, 60 mg/m2 = 132 mg, Intravenous,  Once, 0 of 4 cycles palonosetron (ALOXI) injection 0.25 mg, 0.25 mg, Intravenous,  Once, 0 of 4 cycles pegfilgrastim-cbqv (UDENYCA) injection 6 mg, 6 mg, Subcutaneous, Once, 0 of 4 cycles cyclophosphamide (CYTOXAN) 1,320 mg in sodium chloride 0.9 % 250 mL chemo infusion, 600 mg/m2 = 1,320 mg, Intravenous,  Once, 0 of 4 cycles PACLitaxel (TAXOL) 174 mg in sodium chloride 0.9 % 250 mL chemo infusion (</= 862mm2), 80 mg/m2 = 174 mg, Intravenous,  Once, 0 of 12 cycles fosaprepitant (EMEND) 150 mg in sodium chloride 0.9 % 145 mL IVPB, 150 mg, Intravenous,  Once, 0 of 4 cycles  for chemotherapy treatment.    Malignant neoplasm of upper-outer quadrant of left breast in female, estrogen receptor negative ([facility] 02/19/2020 Cancer Staging   Staging form: Breast, AJCC 8th Edition - Clinical stage from 02/19/2020: Stage IIB (cT2, cN0, cM0, G3, ER-, PR-, HER2-) - Signed by CaGardenia PhlegmNP on 02/25/2020   02/19/2020 Initial Diagnosis   Chest CT for lung cancer follow-up showed a left  breast module. Mammogram and US showed a 3.7cm mass with calcifications at the 2 o'clock position in the left breast, no left axillary adenopathy. Biopsy showed IDC, grade 3, HER-2 negative (0), ER/PR negative, Ki67 40%.   04/08/2020  Surgery   Left lumpectomy [name]): three foci of IDC, grade 3, 3.3cm, 1.0cm, 0.7cm, with high grade DCIS, clear margins, 2 left axillary lymph nodes negative for carcinoma.    05/11/2020 -  Chemotherapy   The patient had DOXOrubicin (ADRIAMYCIN) chemo injection 132 mg, 60 mg/m2 = 132 mg, Intravenous,  Once, 0 of 4 cycles palonosetron (ALOXI) injection 0.25 mg, 0.25 mg, Intravenous,  Once, 0 of 4 cycles pegfilgrastim-cbqv (UDENYCA) injection 6 mg, 6 mg, Subcutaneous, Once, 0 of 4 cycles cyclophosphamide (CYTOXAN) 1,320 mg in sodium chloride 0.9 % 250 mL chemo infusion, 600 mg/m2 = 1,320 mg, Intravenous,  Once, 0 of 4 cycles PACLitaxel (TAXOL) 174 mg in sodium chloride 0.9 % 250 mL chemo infusion (</= 11m/m2), 80 mg/m2 = 174 mg, Intravenous,  Once, 0 of 12 cycles fosaprepitant (EMEND) 150 mg in sodium chloride 0.9 % 145 mL IVPB, 150 mg, Intravenous,  Once, 0 of 4 cycles  for chemotherapy treatment.      CHIEF COMPLIANT: Cycle 1 Adriamycin and Cytoxan  INTERVAL HISTORY: Deborah HEWESis a 64y.o. with above-mentioned history of triple negative left breast who underwent a left lumpectomy and is currently on adjuvant chemotherapy with dose dense Adriamycin and Cytoxan. She presents to the clinic today for cycle 1.  She has chronic shortness of breath exertion and requires wheelchair for ambulation.   ALLERGIES:  has No Known Allergies.  MEDICATIONS:  Current Outpatient Medications  Medication Sig Dispense Refill  . albuterol (VENTOLIN HFA) 108 (90 Base) MCG/ACT inhaler Inhale 2 puffs into the lungs every 6 (six) hours as needed for wheezing or shortness of breath. 8 g 3  . aspirin 81 MG chewable tablet Chew 1 tablet (81 mg total) by mouth daily. 30 tablet 1  . Aspirin-Caffeine (BC FAST PAIN RELIEF ARTHRITIS PO) Take 1 packet by mouth daily as needed (back pain).    . Budeson-Glycopyrrol-Formoterol (BREZTRI AEROSPHERE) 160-9-4.8 MCG/ACT AERO Inhale 2 puffs into the lungs daily. (Patient  taking differently: Inhale 2 puffs into the lungs 2 (two) times daily. ) 5.9 g 0  . diltiazem (CARDIZEM CD) 240 MG 24 hr capsule TAKE 1 CAPSULE BY MOUTH EVERY DAY (Patient taking differently: Take 240 mg by mouth daily. ) 90 capsule 2  . furosemide (LASIX) 20 MG tablet TAKE 2 TABLETS BY MOUTH EVERY DAY 180 tablet 1  . ipratropium-albuterol (DUONEB) 0.5-2.5 (3) MG/3ML SOLN Take 3 mLs by nebulization every 3 (three) hours as needed. 360 mL 11  . lidocaine-prilocaine (EMLA) cream Apply to affected area once 30 g 3  . montelukast (SINGULAIR) 10 MG tablet Take 1 tablet (10 mg total) by mouth at bedtime. 90 tablet 2  . ondansetron (ZOFRAN) 8 MG tablet Take 1 tablet (8 mg total) by mouth 2 (two) times daily as needed. Start on the third day after chemotherapy. 30 tablet 1  . OXYGEN Inhale 4 L/min into the lungs continuous.     . potassium chloride SA (KLOR-CON M20) 20 MEQ tablet TAKE 1/2 TABLET BY MOUTH EVERY DAY (Patient taking differently: Take 10 mEq by mouth daily. ) 45 tablet 1  . prochlorperazine (COMPAZINE) 10 MG tablet Take 1 tablet (10 mg total) by mouth every 6 (six) hours as needed (Nausea or vomiting). 30 tablet 1  .  Spacer/Aero-Holding Chambers (AEROCHAMBER MV) inhaler Use as instructed 1 each 0  . traMADol (ULTRAM) 50 MG tablet Take 1 tablet (50 mg total) by mouth every 6 (six) hours as needed for moderate pain or severe pain. 15 tablet 0   No current facility-administered medications for this visit.    PHYSICAL EXAMINATION: ECOG PERFORMANCE STATUS: 1 - Symptomatic but completely ambulatory  Vitals:   05/11/20 1301  BP: (!) 84/61  Pulse: (!) 119  Resp: 19  Temp: 98.3 F (36.8 C)  SpO2: 96%   Filed Weights   05/11/20 1301  Weight: 238 lb 1.6 oz (108 kg)    LABORATORY DATA:  I have reviewed the data as listed CMP Latest Ref Rng & Units 05/11/2020 04/02/2020 01/26/2020  Glucose 70 - 99 mg/dL 168(H) 108(H) 99  BUN 8 - 23 mg/dL 14 11 15   Creatinine 0.44 - 1.00 mg/dL 0.78 0.87  0.77  Sodium 135 - 145 mmol/L 141 139 143  Potassium 3.5 - 5.1 mmol/L 3.8 4.0 3.9  Chloride 98 - 111 mmol/L 99 95(L) 105  CO2 22 - 32 mmol/L 33(H) 32 27  Calcium 8.9 - 10.3 mg/dL 9.2 9.3 9.2  Total Protein 6.5 - 8.1 g/dL 7.5 - 7.6  Total Bilirubin 0.3 - 1.2 mg/dL 0.2(L) - <0.2(L)  Alkaline Phos 38 - 126 U/L 88 - 88  AST 15 - 41 U/L 22 - 22  ALT 0 - 44 U/L 28 - 39    Lab Results  Component Value Date   WBC 13.1 (H) 05/11/2020   HGB 13.0 05/11/2020   HCT 43.2 05/11/2020   MCV 92.5 05/11/2020   PLT 264 05/11/2020   NEUTROABS 11.7 (H) 05/11/2020    ASSESSMENT & PLAN:  Malignant neoplasm of upper-outer quadrant of left breast in female, estrogen receptor negative ([place]) 04/08/20: Left lumpectomy [name]): three foci of IDC, grade 3, 3.3cm, 1.0cm, 0.7cm, with high grade DCIS, clear margins, 2 left axillary lymph nodes negative for carcinoma. HER-2 negative (0), ER/PR negative, Ki67 40%.   Treatment Plan: 1. Adjuvant chemotherapy with Adriamycin and Cytoxan dose dense 4 followed by Taxol weekly 12 2. Followed by adjuvant radiation therapy  If she cannot tolerate the above chemotherapy then we will consider switching her to CMF. -------------------------------------------------------------------------------------------------------------------------------------------- Echocardiogram: Already done on 01/27/2020: EF greater than 55% Current treatment: Cycle 1 day 1 adjuvant Adriamycin and Cytoxan Labs reviewed, chemo education completed, chemo consent obtained, antiemetics reviewed Echocardiogram 01/27/2020: EF greater than 55%  I plan to give her a significantly lower dosage of Adriamycin and Cytoxan. Severe COPD: On 24-hour oxygen.  Return to clinic in 1 week for toxicity evaluation    No orders of the defined types were placed in this encounter.  The patient has a good understanding of the overall plan. she agrees with it. she will call with any problems that may develop  before the next visit here.  Total time spent: 30 mins including face to face time and time spent for planning, charting and coordination of care  Nicholas Lose, MD 05/11/2020  I, Cloyde Reams Dorshimer, am acting as scribe for Dr. Nicholas Lose.  I have reviewed the above documentation for accuracy and completeness, and I agree with the above.

## 2020-05-11 ENCOUNTER — Other Ambulatory Visit: Payer: Self-pay

## 2020-05-11 ENCOUNTER — Encounter: Payer: Self-pay | Admitting: *Deleted

## 2020-05-11 ENCOUNTER — Inpatient Hospital Stay: Payer: BLUE CROSS/BLUE SHIELD

## 2020-05-11 ENCOUNTER — Encounter: Payer: Self-pay | Admitting: Hematology and Oncology

## 2020-05-11 ENCOUNTER — Inpatient Hospital Stay (HOSPITAL_BASED_OUTPATIENT_CLINIC_OR_DEPARTMENT_OTHER): Payer: BLUE CROSS/BLUE SHIELD | Admitting: Hematology and Oncology

## 2020-05-11 VITALS — BP 97/64 | HR 99 | Temp 98.2°F | Resp 18

## 2020-05-11 DIAGNOSIS — Z5111 Encounter for antineoplastic chemotherapy: Secondary | ICD-10-CM | POA: Diagnosis not present

## 2020-05-11 DIAGNOSIS — C50412 Malignant neoplasm of upper-outer quadrant of left female breast: Secondary | ICD-10-CM

## 2020-05-11 DIAGNOSIS — Z95828 Presence of other vascular implants and grafts: Secondary | ICD-10-CM | POA: Insufficient documentation

## 2020-05-11 DIAGNOSIS — C3432 Malignant neoplasm of lower lobe, left bronchus or lung: Secondary | ICD-10-CM

## 2020-05-11 DIAGNOSIS — Z171 Estrogen receptor negative status [ER-]: Secondary | ICD-10-CM | POA: Diagnosis not present

## 2020-05-11 DIAGNOSIS — Z5189 Encounter for other specified aftercare: Secondary | ICD-10-CM | POA: Diagnosis not present

## 2020-05-11 LAB — CMP (CANCER CENTER ONLY)
ALT: 28 U/L (ref 0–44)
AST: 22 U/L (ref 15–41)
Albumin: 3.3 g/dL — ABNORMAL LOW (ref 3.5–5.0)
Alkaline Phosphatase: 88 U/L (ref 38–126)
Anion gap: 9 (ref 5–15)
BUN: 14 mg/dL (ref 8–23)
CO2: 33 mmol/L — ABNORMAL HIGH (ref 22–32)
Calcium: 9.2 mg/dL (ref 8.9–10.3)
Chloride: 99 mmol/L (ref 98–111)
Creatinine: 0.78 mg/dL (ref 0.44–1.00)
GFR, Estimated: >60 mL/min (ref >60)
Glucose, Bld: 168 mg/dL — ABNORMAL HIGH (ref 70–99)
Potassium: 3.8 mmol/L (ref 3.5–5.1)
Sodium: 141 mmol/L (ref 135–145)
Total Bilirubin: 0.2 mg/dL — ABNORMAL LOW (ref 0.3–1.2)
Total Protein: 7.5 g/dL (ref 6.5–8.1)

## 2020-05-11 LAB — CBC WITH DIFFERENTIAL (CANCER CENTER ONLY)
Abs Immature Granulocytes: 0.04 10*3/uL (ref 0.00–0.07)
Basophils Absolute: 0 10*3/uL (ref 0.0–0.1)
Basophils Relative: 0 %
Eosinophils Absolute: 0.1 10*3/uL (ref 0.0–0.5)
Eosinophils Relative: 1 %
HCT: 43.2 % (ref 36.0–46.0)
Hemoglobin: 13 g/dL (ref 12.0–15.0)
Immature Granulocytes: 0 %
Lymphocytes Relative: 5 %
Lymphs Abs: 0.7 10*3/uL (ref 0.7–4.0)
MCH: 27.8 pg (ref 26.0–34.0)
MCHC: 30.1 g/dL (ref 30.0–36.0)
MCV: 92.5 fL (ref 80.0–100.0)
Monocytes Absolute: 0.5 10*3/uL (ref 0.1–1.0)
Monocytes Relative: 4 %
Neutro Abs: 11.7 10*3/uL — ABNORMAL HIGH (ref 1.7–7.7)
Neutrophils Relative %: 90 %
Platelet Count: 264 10*3/uL (ref 150–400)
RBC: 4.67 MIL/uL (ref 3.87–5.11)
RDW: 15.1 % (ref 11.5–15.5)
WBC Count: 13.1 10*3/uL — ABNORMAL HIGH (ref 4.0–10.5)
nRBC: 0 % (ref 0.0–0.2)

## 2020-05-11 MED ORDER — SODIUM CHLORIDE 0.9 % IV SOLN
10.0000 mg | Freq: Once | INTRAVENOUS | Status: AC
  Administered 2020-05-11: 10 mg via INTRAVENOUS
  Filled 2020-05-11: qty 10

## 2020-05-11 MED ORDER — SODIUM CHLORIDE 0.9 % IV SOLN
400.0000 mg/m2 | Freq: Once | INTRAVENOUS | Status: AC
  Administered 2020-05-11: 880 mg via INTRAVENOUS
  Filled 2020-05-11: qty 44

## 2020-05-11 MED ORDER — SODIUM CHLORIDE 0.9 % IV SOLN
150.0000 mg | Freq: Once | INTRAVENOUS | Status: AC
  Administered 2020-05-11: 150 mg via INTRAVENOUS
  Filled 2020-05-11: qty 150

## 2020-05-11 MED ORDER — HEPARIN SOD (PORK) LOCK FLUSH 100 UNIT/ML IV SOLN
500.0000 [IU] | Freq: Once | INTRAVENOUS | Status: AC | PRN
  Administered 2020-05-11: 500 [IU]
  Filled 2020-05-11: qty 5

## 2020-05-11 MED ORDER — PALONOSETRON HCL INJECTION 0.25 MG/5ML
INTRAVENOUS | Status: AC
  Filled 2020-05-11: qty 5

## 2020-05-11 MED ORDER — SODIUM CHLORIDE 0.9 % IV SOLN
Freq: Once | INTRAVENOUS | Status: AC
  Filled 2020-05-11: qty 250

## 2020-05-11 MED ORDER — DOXORUBICIN HCL CHEMO IV INJECTION 2 MG/ML
40.0000 mg/m2 | Freq: Once | INTRAVENOUS | Status: AC
  Administered 2020-05-11: 88 mg via INTRAVENOUS
  Filled 2020-05-11: qty 44

## 2020-05-11 MED ORDER — SODIUM CHLORIDE 0.9% FLUSH
10.0000 mL | INTRAVENOUS | Status: DC | PRN
  Administered 2020-05-11: 10 mL
  Filled 2020-05-11: qty 10

## 2020-05-11 MED ORDER — SODIUM CHLORIDE 0.9% FLUSH
10.0000 mL | Freq: Once | INTRAVENOUS | Status: AC
  Administered 2020-05-11: 10 mL
  Filled 2020-05-11: qty 10

## 2020-05-11 MED ORDER — PALONOSETRON HCL INJECTION 0.25 MG/5ML
0.2500 mg | Freq: Once | INTRAVENOUS | Status: AC
  Administered 2020-05-11: 0.25 mg via INTRAVENOUS

## 2020-05-11 NOTE — Progress Notes (Signed)
Reconnected with patient at registration from a few years ago to reintroduce myself as Arboriculturist and to offer available resources.  Discussed one-time $1000 Radio broadcast assistant to assist with personal expenses while going through treatment.  Gave her my card if interested in applying and for any additional financial questions or concerns.

## 2020-05-11 NOTE — Assessment & Plan Note (Signed)
04/08/20: Left lumpectomy [name]): three foci of IDC, grade 3, 3.3cm, 1.0cm, 0.7cm, with high grade DCIS, clear margins, 2 left axillary lymph nodes negative for carcinoma. HER-2 negative (0), ER/PR negative, Ki67 40%.  Pathology counseling: I discussed the final pathology report of the patient provided  a copy of this report. I discussed the margins as well as lymph node surgeries. We also discussed the final staging along with previously performed ER/PR and HER-2/neu testing.  Treatment Plan: 1. Adjuvant chemotherapy with Adriamycin and Cytoxan dose dense 4 followed by Taxol weekly 12 2. Followed by adjuvant radiation therapy  If she cannot tolerate the above chemotherapy then we will consider switching her to [facility]F. Patient has 24-hour oxygen and uses a wheelchair for ambulation. I am worried about her tolerability to chemotherapy. However the patient wants to be extremely aggressive and wants to try the standard of care and if she cannot tolerate it then she is willing to consider a less aggressive option. -------------------------------------------------------------------------------------------------------------------------------------------- Echocardiogram: Already done on 01/27/2020: EF greater than 55% Current treatment: Cycle 1 day 1 adjuvant Adriamycin and Cytoxan Labs reviewed, chemo education completed, chemo consent obtained, antiemetics reviewed Echocardiogram 01/27/2020: EF greater than 55%  Return to clinic in 1 week for toxicity evaluation

## 2020-05-11 NOTE — Patient Instructions (Signed)
[facility] Discharge Instructions for Patients Receiving Chemotherapy  Today you received the following chemotherapy agents Adriamycin, Cytoxan  \[id number]\\[id number]\ To help prevent nausea and vomiting after your treatment, we encourage you to take your nausea medication as directed.   If you develop nausea and vomiting that is not controlled by your nausea medication, call the clinic.   BELOW ARE SYMPTOMS THAT SHOULD BE REPORTED IMMEDIATELY:  *FEVER GREATER THAN 100.5 F  *CHILLS WITH OR WITHOUT FEVER  NAUSEA AND VOMITING THAT IS NOT CONTROLLED WITH YOUR NAUSEA MEDICATION  *UNUSUAL SHORTNESS OF BREATH  *UNUSUAL BRUISING OR BLEEDING  TENDERNESS IN MOUTH AND THROAT WITH OR WITHOUT PRESENCE OF ULCERS  *URINARY PROBLEMS  *BOWEL PROBLEMS  UNUSUAL RASH Items with * indicate a potential emergency and should be followed up as soon as possible.  Feel free to call the clinic you have any questions or concerns. The clinic phone number is [phone].  Please show the [place] [facility] at check-in to the Emergency Department and triage nurse.

## 2020-05-12 ENCOUNTER — Telehealth: Payer: Self-pay | Admitting: *Deleted

## 2020-05-13 ENCOUNTER — Other Ambulatory Visit: Payer: Self-pay

## 2020-05-13 ENCOUNTER — Inpatient Hospital Stay: Payer: BLUE CROSS/BLUE SHIELD

## 2020-05-13 VITALS — BP 113/66 | HR 100 | Temp 98.2°F | Resp 18

## 2020-05-13 DIAGNOSIS — Z171 Estrogen receptor negative status [ER-]: Secondary | ICD-10-CM | POA: Diagnosis not present

## 2020-05-13 DIAGNOSIS — C50412 Malignant neoplasm of upper-outer quadrant of left female breast: Secondary | ICD-10-CM

## 2020-05-13 DIAGNOSIS — Z5111 Encounter for antineoplastic chemotherapy: Secondary | ICD-10-CM | POA: Diagnosis not present

## 2020-05-13 DIAGNOSIS — C3432 Malignant neoplasm of lower lobe, left bronchus or lung: Secondary | ICD-10-CM

## 2020-05-13 DIAGNOSIS — Z5189 Encounter for other specified aftercare: Secondary | ICD-10-CM | POA: Diagnosis not present

## 2020-05-13 MED ORDER — PEGFILGRASTIM-CBQV 6 MG/0.6ML ~~LOC~~ SOSY
PREFILLED_SYRINGE | SUBCUTANEOUS | Status: AC
  Filled 2020-05-13: qty 0.6

## 2020-05-13 MED ORDER — PEGFILGRASTIM-CBQV 6 MG/0.6ML ~~LOC~~ SOSY
6.0000 mg | PREFILLED_SYRINGE | Freq: Once | SUBCUTANEOUS | Status: AC
  Administered 2020-05-13: 6 mg via SUBCUTANEOUS

## 2020-05-14 ENCOUNTER — Telehealth: Payer: Self-pay | Admitting: Hematology and Oncology

## 2020-05-14 NOTE — Telephone Encounter (Signed)
Scheduled per 10/26 los. Called pt and left a msg

## 2020-05-17 NOTE — Progress Notes (Signed)
Patient Care Team: Pleas Koch, NP as PCP - General (Internal Medicine) [name], MD as PCP - Cardiology (Cardiology) [name], MD (Inactive) (Family Medicine) [name], MD as Consulting Physician (Cardiology) Rockwell Germany, RN as Oncology Nurse Navigator Mauro Kaufmann, RN as Oncology Nurse Navigator  DIAGNOSIS:    ICD-10-CM   1. Malignant neoplasm of upper-outer quadrant of left breast in female, estrogen receptor negative ([place])  C50.412 CBC with Differential ([place] Only)   Z17.1 CMP ([place] only)    SUMMARY OF ONCOLOGIC HISTORY: Oncology History  Primary malignant neoplasm of bronchus of left lower lobe ([place])  05/02/2018 Initial Diagnosis   Stage III squamous cell carcinoma of left lung ([place])   05/13/2018 - 06/24/2018 Chemotherapy   The patient had palonosetron (ALOXI) injection 0.25 mg, 0.25 mg, Intravenous,  Once, 7 of 7 cycles Administration: 0.25 mg (05/13/2018), 0.25 mg (06/10/2018), 0.25 mg (06/17/2018), 0.25 mg (05/20/2018), 0.25 mg (06/24/2018), 0.25 mg (05/27/2018), 0.25 mg (06/03/2018) CARBOplatin (PARAPLATIN) 300 mg in sodium chloride 0.9 % 250 mL chemo infusion, 300 mg (100 % of original dose 300 mg), Intravenous,  Once, 7 of 7 cycles Dose modification: 300 mg (original dose 300 mg, Cycle 1) Administration: 300 mg (05/13/2018), 300 mg (06/10/2018), 260 mg (06/17/2018), 300 mg (05/20/2018), 300 mg (06/24/2018), 300 mg (05/27/2018), 300 mg (06/03/2018) PACLitaxel (TAXOL) 96 mg in sodium chloride 0.9 % 250 mL chemo infusion (</= 22m/m2), 45 mg/m2 = 96 mg, Intravenous,  Once, 7 of 7 cycles Administration: 96 mg (05/13/2018), 96 mg (06/10/2018), 96 mg (06/17/2018), 96 mg (05/20/2018), 96 mg (06/24/2018), 96 mg (05/27/2018), 96 mg (06/03/2018)  for chemotherapy treatment.    07/31/2018 - 07/15/2019 Chemotherapy   The patient had durvalumab (IMFINZI) 1,000 mg in sodium chloride 0.9 % 100 mL chemo infusion, 1,020 mg, Intravenous,  Once,  26 of 26 cycles Administration: 1,000 mg (07/31/2018), 1,000 mg (10/22/2018), 1,000 mg (11/05/2018), 1,000 mg (11/19/2018), 1,000 mg (12/03/2018), 1,000 mg (08/14/2018), 1,000 mg (12/17/2018), 1,000 mg (08/27/2018), 1,000 mg (09/10/2018), 1,000 mg (09/24/2018), 1,000 mg (10/11/2018), 1,000 mg (12/31/2018), 1,000 mg (01/14/2019), 1,120 mg (01/28/2019), 1,120 mg (02/11/2019), 1,120 mg (02/25/2019), 1,120 mg (03/11/2019), 1,120 mg (03/25/2019), 1,120 mg (04/08/2019), 1,120 mg (04/22/2019), 1,120 mg (05/06/2019), 1,120 mg (05/20/2019), 1,120 mg (06/03/2019), 1,120 mg (06/17/2019), 1,120 mg (07/01/2019), 1,120 mg (07/15/2019)  for chemotherapy treatment.    05/11/2020 - 05/13/2020 Chemotherapy   The patient had DOXOrubicin (ADRIAMYCIN) chemo injection 88 mg, 40 mg/m2 = 88 mg (66.7 % of original dose 60 mg/m2), Intravenous,  Once, 1 of 4 cycles Dose modification: 50 mg/m2 (original dose 60 mg/m2, Cycle 1, Reason: Provider Judgment), 40 mg/m2 (original dose 60 mg/m2, Cycle 1, Reason: Provider Judgment) Administration: 88 mg (05/11/2020) palonosetron (ALOXI) injection 0.25 mg, 0.25 mg, Intravenous,  Once, 1 of 4 cycles Administration: 0.25 mg (05/11/2020) pegfilgrastim-cbqv (UDENYCA) injection 6 mg, 6 mg, Subcutaneous, Once, 1 of 4 cycles Administration: 6 mg (05/13/2020) cyclophosphamide (CYTOXAN) 880 mg in sodium chloride 0.9 % 250 mL chemo infusion, 400 mg/m2 = 880 mg (66.7 % of original dose 600 mg/m2), Intravenous,  Once, 1 of 4 cycles Dose modification: 400 mg/m2 (original dose 600 mg/m2, Cycle 1, Reason: Provider Judgment) Administration: 880 mg (05/11/2020) PACLitaxel (TAXOL) 174 mg in sodium chloride 0.9 % 250 mL chemo infusion (</= 849mm2), 80 mg/m2 = 174 mg, Intravenous,  Once, 0 of 12 cycles fosaprepitant (EMEND) 150 mg in sodium chloride 0.9 % 145 mL IVPB, 150 mg, Intravenous,  Once, 1  of 4 cycles Administration: 150 mg (05/11/2020)  for chemotherapy treatment.    Malignant neoplasm of upper-outer quadrant of left  breast in female, estrogen receptor negative ([place])  02/19/2020 Cancer Staging   Staging form: Breast, AJCC 8th Edition - Clinical stage from 02/19/2020: Stage IIB (cT2, cN0, cM0, G3, ER-, PR-, HER2-) - Signed by Gardenia Phlegm, NP on 02/25/2020   02/19/2020 Initial Diagnosis   Chest CT for lung cancer follow-up showed a left breast module. Mammogram and US showed a 3.7cm mass with calcifications at the 2 o'clock position in the left breast, no left axillary adenopathy. Biopsy showed IDC, grade 3, HER-2 negative (0), ER/PR negative, Ki67 40%.   04/08/2020 Surgery   Left lumpectomy [name]): three foci of IDC, grade 3, 3.3cm, 1.0cm, 0.7cm, with high grade DCIS, clear margins, 2 left axillary lymph nodes negative for carcinoma.    05/11/2020 - 05/13/2020 Chemotherapy   The patient had DOXOrubicin (ADRIAMYCIN) chemo injection 88 mg, 40 mg/m2 = 88 mg (66.7 % of original dose 60 mg/m2), Intravenous,  Once, 1 of 4 cycles Dose modification: 50 mg/m2 (original dose 60 mg/m2, Cycle 1, Reason: Provider Judgment), 40 mg/m2 (original dose 60 mg/m2, Cycle 1, Reason: Provider Judgment) Administration: 88 mg (05/11/2020) palonosetron (ALOXI) injection 0.25 mg, 0.25 mg, Intravenous,  Once, 1 of 4 cycles Administration: 0.25 mg (05/11/2020) pegfilgrastim-cbqv (UDENYCA) injection 6 mg, 6 mg, Subcutaneous, Once, 1 of 4 cycles Administration: 6 mg (05/13/2020) cyclophosphamide (CYTOXAN) 880 mg in sodium chloride 0.9 % 250 mL chemo infusion, 400 mg/m2 = 880 mg (66.7 % of original dose 600 mg/m2), Intravenous,  Once, 1 of 4 cycles Dose modification: 400 mg/m2 (original dose 600 mg/m2, Cycle 1, Reason: Provider Judgment) Administration: 880 mg (05/11/2020) PACLitaxel (TAXOL) 174 mg in sodium chloride 0.9 % 250 mL chemo infusion (</= $RemoveBefor'80mg'pHDVwuTRzbdc$ /m2), 80 mg/m2 = 174 mg, Intravenous,  Once, 0 of 12 cycles fosaprepitant (EMEND) 150 mg in sodium chloride 0.9 % 145 mL IVPB, 150 mg, Intravenous,  Once, 1 of 4  cycles Administration: 150 mg (05/11/2020)  for chemotherapy treatment.      CHIEF COMPLIANT: Cycle 1 Day 8 Adriamycin and Cytoxan  INTERVAL HISTORY: Deborah [name] is a 64 y.o. with above-mentioned history of triple negative left breast who underwent a left lumpectomy and is currently on adjuvant chemotherapy with dose dense Adriamycin and Cytoxan. She presents to the clinic today for a toxicity check following cycle 1.    She could not tolerate chemotherapy.  She felt extremely fatigued and short of breath to minimal exertion and heart rate went up into the 140s and she does not have any energy to do anything.  This is in spite of giving her a 40% dose reduction with the first cycle of chemo.  She did not have any nausea but she did have profound constipation.  ALLERGIES:  has No Known Allergies.  MEDICATIONS:  Current Outpatient Medications  Medication Sig Dispense Refill  . albuterol (VENTOLIN HFA) 108 (90 Base) MCG/ACT inhaler Inhale 2 puffs into the lungs every 6 (six) hours as needed for wheezing or shortness of breath. 8 g 3  . aspirin 81 MG chewable tablet Chew 1 tablet (81 mg total) by mouth daily. 30 tablet 1  . Aspirin-Caffeine (BC FAST PAIN RELIEF ARTHRITIS PO) Take 1 packet by mouth daily as needed (back pain).    . Budeson-Glycopyrrol-Formoterol (BREZTRI AEROSPHERE) 160-9-4.8 MCG/ACT AERO Inhale 2 puffs into the lungs daily. (Patient taking differently: Inhale 2 puffs into the lungs 2 (two)  times daily. ) 5.9 g 0  . diltiazem (CARDIZEM CD) 240 MG 24 hr capsule TAKE 1 CAPSULE BY MOUTH EVERY DAY (Patient taking differently: Take 240 mg by mouth daily. ) 90 capsule 2  . furosemide (LASIX) 20 MG tablet TAKE 2 TABLETS BY MOUTH EVERY DAY 180 tablet 1  . ipratropium-albuterol (DUONEB) 0.5-2.5 (3) MG/3ML SOLN Take 3 mLs by nebulization every 3 (three) hours as needed. 360 mL 11  . montelukast (SINGULAIR) 10 MG tablet Take 1 tablet (10 mg total) by mouth at bedtime. 90 tablet 2  .  OXYGEN Inhale 4 L/min into the lungs continuous.     . potassium chloride SA (KLOR-CON M20) 20 MEQ tablet TAKE 1/2 TABLET BY MOUTH EVERY DAY (Patient taking differently: Take 10 mEq by mouth daily. ) 45 tablet 1  . Spacer/Aero-Holding Chambers (AEROCHAMBER MV) inhaler Use as instructed 1 each 0  . traMADol (ULTRAM) 50 MG tablet Take 1 tablet (50 mg total) by mouth every 6 (six) hours as needed for moderate pain or severe pain. 15 tablet 0   No current facility-administered medications for this visit.    PHYSICAL EXAMINATION: ECOG PERFORMANCE STATUS: 3 - Symptomatic, >50% confined to bed  Vitals:   05/18/20 1107  BP: 98/70  Pulse: (!) 110  Resp: 17  Temp: 98.2 F (36.8 C)  SpO2: 98%   There were no vitals filed for this visit.  LABORATORY DATA:  I have reviewed the data as listed CMP Latest Ref Rng & Units 05/18/2020 05/11/2020 04/02/2020  Glucose 70 - 99 mg/dL 110(H) 168(H) 108(H)  BUN 8 - 23 mg/dL _0 Creatinine 0.44 - 1.00 mg/dL 0.61 0.78 0.87  Sodium 135 - 145 mmol/L 139 141 139  Potassium 3.5 - 5.1 mmol/L 4.6 3.8 4.0  Chloride 98 - 111 mmol/L 96(L) 99 95(L)  CO2 22 - 32 mmol/L 38(H) 33(H) 32  Calcium 8.9 - 10.3 mg/dL 9.0 9.2 9.3  Total Protein 6.5 - 8.1 g/dL 6.9 7.5 -  Total Bilirubin 0.3 - 1.2 mg/dL 0.6 0.2(L) -  Alkaline Phos 38 - 126 U/L 124 88 -  AST 15 - 41 U/L 33 22 -  ALT 0 - 44 U/L 51(H) 28 -    Lab Results  Component Value Date   WBC 4.9 05/18/2020   HGB 11.6 (L) 05/18/2020   HCT 39.3 05/18/2020   MCV 94.0 05/18/2020   PLT 183 05/18/2020   NEUTROABS 3.9 05/18/2020    ASSESSMENT & PLAN:  Malignant neoplasm of upper-outer quadrant of left breast in female, estrogen receptor negative ([place]) 04/08/20:Left lumpectomy [name]): three foci of IDC, grade 3, 3.3cm, 1.0cm, 0.7cm, with high grade DCIS, clear margins, 2 left axillary lymph nodes negative for carcinoma. HER-2 negative (0), ER/PR negative, Ki67 40%.   Treatment Plan: 1.Adjuvant chemotherapy  with Adriamycin and Cytoxan dose dense 4 followed by Taxol weekly 12 2. Followed by adjuvant radiation therapy  If she cannot tolerate the above chemotherapy then we will consider switching her to CM[facility]. -------------------------------------------------------------------------------------------------------------------------------------------- Echocardiogram: Already done on 01/27/2020: EF greater than 55% Current treatment: Cycle 1 day 8 adjuvant Adriamycin and Cytoxan  Chemo toxicities:  1.  Profound fatigue 2. generalized failure to thrive Hypoxia and tachycardia  I gave her a significantly lower dosage of Adriamycin and Cytoxan. Severe COPD: On 24-hour oxygen.  I do not recommend any further chemotherapy for her.  I do not think she could even tolerate Taxotere and Cytoxan. Labs performed today look reasonable.  She is not  neutropenic.   She also informed me that she does not want to radiation because of concern for radiation damage to the lungs.  She wants to keep the port.  So we will see her every 2 months for port flushes and I will see her back in 4 months with labs.   Orders Placed This Encounter  Procedures  . CBC with Differential ([facility] Only)    Standing Status:   Future    Standing Expiration Date:   05/18/2021  . CMP ([facility] only)    Standing Status:   Future    Standing Expiration Date:   05/18/2021   The patient has a good understanding of the overall plan. she agrees with it. she will call with any problems that may develop before the next visit here.  Total time spent: 30 mins including face to face time and time spent for planning, charting and coordination of care  Nicholas Lose, MD 05/18/2020  I, Cloyde Reams Dorshimer, am acting as scribe for Dr. Nicholas Lose.  I have reviewed the above documentation for accuracy and completeness, and I agree with the above.

## 2020-05-18 ENCOUNTER — Inpatient Hospital Stay: Payer: BLUE CROSS/BLUE SHIELD

## 2020-05-18 ENCOUNTER — Other Ambulatory Visit: Payer: Self-pay

## 2020-05-18 ENCOUNTER — Other Ambulatory Visit: Payer: Self-pay | Admitting: Primary Care

## 2020-05-18 ENCOUNTER — Inpatient Hospital Stay: Payer: BLUE CROSS/BLUE SHIELD | Attending: Internal Medicine | Admitting: Hematology and Oncology

## 2020-05-18 ENCOUNTER — Encounter: Payer: Self-pay | Admitting: *Deleted

## 2020-05-18 DIAGNOSIS — C3432 Malignant neoplasm of lower lobe, left bronchus or lung: Secondary | ICD-10-CM

## 2020-05-18 DIAGNOSIS — Z171 Estrogen receptor negative status [ER-]: Secondary | ICD-10-CM | POA: Diagnosis not present

## 2020-05-18 DIAGNOSIS — Z95828 Presence of other vascular implants and grafts: Secondary | ICD-10-CM

## 2020-05-18 DIAGNOSIS — Z79899 Other long term (current) drug therapy: Secondary | ICD-10-CM | POA: Insufficient documentation

## 2020-05-18 DIAGNOSIS — E876 Hypokalemia: Secondary | ICD-10-CM

## 2020-05-18 DIAGNOSIS — C50412 Malignant neoplasm of upper-outer quadrant of left female breast: Secondary | ICD-10-CM | POA: Insufficient documentation

## 2020-05-18 LAB — CMP (CANCER CENTER ONLY)
ALT: 51 U/L — ABNORMAL HIGH (ref 0–44)
AST: 33 U/L (ref 15–41)
Albumin: 3.1 g/dL — ABNORMAL LOW (ref 3.5–5.0)
Alkaline Phosphatase: 124 U/L (ref 38–126)
Anion gap: 5 (ref 5–15)
BUN: 12 mg/dL (ref 8–23)
CO2: 38 mmol/L — ABNORMAL HIGH (ref 22–32)
Calcium: 9 mg/dL (ref 8.9–10.3)
Chloride: 96 mmol/L — ABNORMAL LOW (ref 98–111)
Creatinine: 0.61 mg/dL (ref 0.44–1.00)
GFR, Estimated: >60 mL/min (ref >60)
Glucose, Bld: 110 mg/dL — ABNORMAL HIGH (ref 70–99)
Potassium: 4.6 mmol/L (ref 3.5–5.1)
Sodium: 139 mmol/L (ref 135–145)
Total Bilirubin: 0.6 mg/dL (ref 0.3–1.2)
Total Protein: 6.9 g/dL (ref 6.5–8.1)

## 2020-05-18 LAB — CBC WITH DIFFERENTIAL (CANCER CENTER ONLY)
Abs Immature Granulocytes: 0.07 10*3/uL (ref 0.00–0.07)
Basophils Absolute: 0.1 10*3/uL (ref 0.0–0.1)
Basophils Relative: 1 %
Eosinophils Absolute: 0.1 10*3/uL (ref 0.0–0.5)
Eosinophils Relative: 3 %
HCT: 39.3 % (ref 36.0–46.0)
Hemoglobin: 11.6 g/dL — ABNORMAL LOW (ref 12.0–15.0)
Immature Granulocytes: 1 %
Lymphocytes Relative: 8 %
Lymphs Abs: 0.4 10*3/uL — ABNORMAL LOW (ref 0.7–4.0)
MCH: 27.8 pg (ref 26.0–34.0)
MCHC: 29.5 g/dL — ABNORMAL LOW (ref 30.0–36.0)
MCV: 94 fL (ref 80.0–100.0)
Monocytes Absolute: 0.4 10*3/uL (ref 0.1–1.0)
Monocytes Relative: 7 %
Neutro Abs: 3.9 10*3/uL (ref 1.7–7.7)
Neutrophils Relative %: 80 %
Platelet Count: 183 10*3/uL (ref 150–400)
RBC: 4.18 MIL/uL (ref 3.87–5.11)
RDW: 15 % (ref 11.5–15.5)
WBC Count: 4.9 10*3/uL (ref 4.0–10.5)
nRBC: 0 % (ref 0.0–0.2)

## 2020-05-18 MED ORDER — SODIUM CHLORIDE 0.9% FLUSH
10.0000 mL | Freq: Once | INTRAVENOUS | Status: AC
  Administered 2020-05-18: 10 mL
  Filled 2020-05-18: qty 10

## 2020-05-18 MED ORDER — HEPARIN SOD (PORK) LOCK FLUSH 100 UNIT/ML IV SOLN
500.0000 [IU] | Freq: Once | INTRAVENOUS | Status: AC
  Administered 2020-05-18: 500 [IU]
  Filled 2020-05-18: qty 5

## 2020-05-18 NOTE — Telephone Encounter (Signed)
Last OV 12/16/19 Last fill 11/26/19  #45/1

## 2020-05-18 NOTE — Assessment & Plan Note (Signed)
04/08/20:Left lumpectomy Deborah [name]): three foci of IDC, grade 3, 3.3cm, 1.0cm, 0.7cm, with high grade DCIS, clear margins, 2 left axillary lymph nodes negative for carcinoma. HER-2 negative (0), ER/PR negative, Ki67 40%.   Treatment Plan: 1.Adjuvant chemotherapy with Adriamycin and Cytoxan dose dense 4 followed by Taxol weekly 12 2. Followed by adjuvant radiation therapy  If she cannot tolerate the above chemotherapy then we will consider switching her to CMF. -------------------------------------------------------------------------------------------------------------------------------------------- Echocardiogram: Already done on 01/27/2020: EF greater than 55% Current treatment: Cycle 1 day 8 adjuvant Adriamycin and Cytoxan  Chemo toxicities:  I gave her a significantly lower dosage of Adriamycin and Cytoxan. Severe COPD: On 24-hour oxygen.  Return to clinic in 1 week for cycle 2

## 2020-05-21 ENCOUNTER — Other Ambulatory Visit: Payer: Self-pay | Admitting: Cardiovascular Disease

## 2020-05-24 ENCOUNTER — Other Ambulatory Visit: Payer: Self-pay

## 2020-05-24 ENCOUNTER — Emergency Department (HOSPITAL_COMMUNITY): Payer: BLUE CROSS/BLUE SHIELD

## 2020-05-24 ENCOUNTER — Inpatient Hospital Stay (HOSPITAL_COMMUNITY)
Admission: EM | Admit: 2020-05-24 | Discharge: 2020-06-02 | DRG: 871 | Disposition: A | Discharge status: Home / Self Care | Payer: BLUE CROSS/BLUE SHIELD | Attending: Family Medicine | Admitting: Family Medicine

## 2020-05-24 DIAGNOSIS — J9601 Acute respiratory failure with hypoxia: Secondary | ICD-10-CM | POA: Diagnosis not present

## 2020-05-24 DIAGNOSIS — R5381 Other malaise: Secondary | ICD-10-CM | POA: Diagnosis not present

## 2020-05-24 DIAGNOSIS — Z20822 Contact with and (suspected) exposure to covid-19: Secondary | ICD-10-CM | POA: Diagnosis not present

## 2020-05-24 DIAGNOSIS — J432 Centrilobular emphysema: Secondary | ICD-10-CM | POA: Diagnosis not present

## 2020-05-24 DIAGNOSIS — J90 Pleural effusion, not elsewhere classified: Secondary | ICD-10-CM | POA: Diagnosis not present

## 2020-05-24 DIAGNOSIS — A419 Sepsis, unspecified organism: Principal | ICD-10-CM | POA: Diagnosis present

## 2020-05-24 DIAGNOSIS — J9610 Chronic respiratory failure, unspecified whether with hypoxia or hypercapnia: Secondary | ICD-10-CM | POA: Diagnosis present

## 2020-05-24 DIAGNOSIS — R0902 Hypoxemia: Secondary | ICD-10-CM | POA: Diagnosis not present

## 2020-05-24 DIAGNOSIS — J9621 Acute and chronic respiratory failure with hypoxia: Secondary | ICD-10-CM | POA: Diagnosis not present

## 2020-05-24 DIAGNOSIS — Z7189 Other specified counseling: Secondary | ICD-10-CM | POA: Diagnosis not present

## 2020-05-24 DIAGNOSIS — R0689 Other abnormalities of breathing: Secondary | ICD-10-CM | POA: Diagnosis not present

## 2020-05-24 DIAGNOSIS — J9611 Chronic respiratory failure with hypoxia: Secondary | ICD-10-CM | POA: Diagnosis not present

## 2020-05-24 DIAGNOSIS — Z800 Family history of malignant neoplasm of digestive organs: Secondary | ICD-10-CM

## 2020-05-24 DIAGNOSIS — R0603 Acute respiratory distress: Secondary | ICD-10-CM | POA: Diagnosis present

## 2020-05-24 DIAGNOSIS — Z7982 Long term (current) use of aspirin: Secondary | ICD-10-CM

## 2020-05-24 DIAGNOSIS — Z923 Personal history of irradiation: Secondary | ICD-10-CM | POA: Diagnosis not present

## 2020-05-24 DIAGNOSIS — R000 Tachycardia, unspecified: Secondary | ICD-10-CM | POA: Diagnosis not present

## 2020-05-24 DIAGNOSIS — R6520 Severe sepsis without septic shock: Secondary | ICD-10-CM | POA: Diagnosis not present

## 2020-05-24 DIAGNOSIS — G4733 Obstructive sleep apnea (adult) (pediatric): Secondary | ICD-10-CM | POA: Diagnosis present

## 2020-05-24 DIAGNOSIS — J9622 Acute and chronic respiratory failure with hypercapnia: Secondary | ICD-10-CM | POA: Diagnosis not present

## 2020-05-24 DIAGNOSIS — Z9889 Other specified postprocedural states: Secondary | ICD-10-CM

## 2020-05-24 DIAGNOSIS — Z818 Family history of other mental and behavioral disorders: Secondary | ICD-10-CM

## 2020-05-24 DIAGNOSIS — C50412 Malignant neoplasm of upper-outer quadrant of left female breast: Secondary | ICD-10-CM | POA: Diagnosis not present

## 2020-05-24 DIAGNOSIS — C50919 Malignant neoplasm of unspecified site of unspecified female breast: Secondary | ICD-10-CM | POA: Diagnosis not present

## 2020-05-24 DIAGNOSIS — C3432 Malignant neoplasm of lower lobe, left bronchus or lung: Secondary | ICD-10-CM | POA: Diagnosis not present

## 2020-05-24 DIAGNOSIS — Z801 Family history of malignant neoplasm of trachea, bronchus and lung: Secondary | ICD-10-CM | POA: Diagnosis not present

## 2020-05-24 DIAGNOSIS — R069 Unspecified abnormalities of breathing: Secondary | ICD-10-CM | POA: Diagnosis not present

## 2020-05-24 DIAGNOSIS — I272 Pulmonary hypertension, unspecified: Secondary | ICD-10-CM | POA: Diagnosis present

## 2020-05-24 DIAGNOSIS — D62 Acute posthemorrhagic anemia: Secondary | ICD-10-CM | POA: Diagnosis not present

## 2020-05-24 DIAGNOSIS — N179 Acute kidney failure, unspecified: Secondary | ICD-10-CM | POA: Diagnosis not present

## 2020-05-24 DIAGNOSIS — I110 Hypertensive heart disease with heart failure: Secondary | ICD-10-CM | POA: Diagnosis present

## 2020-05-24 DIAGNOSIS — J80 Acute respiratory distress syndrome: Secondary | ICD-10-CM | POA: Diagnosis not present

## 2020-05-24 DIAGNOSIS — Z8249 Family history of ischemic heart disease and other diseases of the circulatory system: Secondary | ICD-10-CM

## 2020-05-24 DIAGNOSIS — E662 Morbid (severe) obesity with alveolar hypoventilation: Secondary | ICD-10-CM | POA: Diagnosis not present

## 2020-05-24 DIAGNOSIS — J449 Chronic obstructive pulmonary disease, unspecified: Secondary | ICD-10-CM | POA: Diagnosis not present

## 2020-05-24 DIAGNOSIS — Z9981 Dependence on supplemental oxygen: Secondary | ICD-10-CM

## 2020-05-24 DIAGNOSIS — J441 Chronic obstructive pulmonary disease with (acute) exacerbation: Secondary | ICD-10-CM | POA: Diagnosis present

## 2020-05-24 DIAGNOSIS — I5032 Chronic diastolic (congestive) heart failure: Secondary | ICD-10-CM | POA: Diagnosis not present

## 2020-05-24 DIAGNOSIS — J439 Emphysema, unspecified: Secondary | ICD-10-CM | POA: Diagnosis not present

## 2020-05-24 DIAGNOSIS — Z807 Family history of other malignant neoplasms of lymphoid, hematopoietic and related tissues: Secondary | ICD-10-CM

## 2020-05-24 DIAGNOSIS — Z9114 Patient's other noncompliance with medication regimen: Secondary | ICD-10-CM

## 2020-05-24 DIAGNOSIS — Z171 Estrogen receptor negative status [ER-]: Secondary | ICD-10-CM | POA: Diagnosis not present

## 2020-05-24 DIAGNOSIS — Z6841 Body Mass Index (BMI) 40.0 and over, adult: Secondary | ICD-10-CM | POA: Diagnosis not present

## 2020-05-24 DIAGNOSIS — Z808 Family history of malignant neoplasm of other organs or systems: Secondary | ICD-10-CM

## 2020-05-24 DIAGNOSIS — Z853 Personal history of malignant neoplasm of breast: Secondary | ICD-10-CM | POA: Diagnosis not present

## 2020-05-24 DIAGNOSIS — Z79899 Other long term (current) drug therapy: Secondary | ICD-10-CM

## 2020-05-24 DIAGNOSIS — E861 Hypovolemia: Secondary | ICD-10-CM | POA: Diagnosis not present

## 2020-05-24 DIAGNOSIS — J189 Pneumonia, unspecified organism: Secondary | ICD-10-CM | POA: Diagnosis not present

## 2020-05-24 DIAGNOSIS — D638 Anemia in other chronic diseases classified elsewhere: Secondary | ICD-10-CM | POA: Diagnosis not present

## 2020-05-24 DIAGNOSIS — I9589 Other hypotension: Secondary | ICD-10-CM | POA: Diagnosis not present

## 2020-05-24 DIAGNOSIS — Z87891 Personal history of nicotine dependence: Secondary | ICD-10-CM

## 2020-05-24 DIAGNOSIS — K922 Gastrointestinal hemorrhage, unspecified: Secondary | ICD-10-CM | POA: Diagnosis not present

## 2020-05-24 DIAGNOSIS — R0602 Shortness of breath: Secondary | ICD-10-CM | POA: Diagnosis not present

## 2020-05-24 DIAGNOSIS — Z9221 Personal history of antineoplastic chemotherapy: Secondary | ICD-10-CM | POA: Diagnosis not present

## 2020-05-24 DIAGNOSIS — Z515 Encounter for palliative care: Secondary | ICD-10-CM | POA: Diagnosis not present

## 2020-05-24 DIAGNOSIS — J948 Other specified pleural conditions: Secondary | ICD-10-CM | POA: Diagnosis not present

## 2020-05-24 DIAGNOSIS — I361 Nonrheumatic tricuspid (valve) insufficiency: Secondary | ICD-10-CM | POA: Diagnosis not present

## 2020-05-24 DIAGNOSIS — Z825 Family history of asthma and other chronic lower respiratory diseases: Secondary | ICD-10-CM | POA: Diagnosis not present

## 2020-05-24 DIAGNOSIS — R531 Weakness: Secondary | ICD-10-CM | POA: Diagnosis not present

## 2020-05-24 DIAGNOSIS — J984 Other disorders of lung: Secondary | ICD-10-CM

## 2020-05-24 HISTORY — DX: Acute and chronic respiratory failure with hypoxia: J96.21

## 2020-05-24 LAB — URINALYSIS, ROUTINE W REFLEX MICROSCOPIC
Bilirubin Urine: NEGATIVE
Glucose, UA: NEGATIVE mg/dL
Ketones, ur: NEGATIVE mg/dL
Leukocytes,Ua: NEGATIVE
Nitrite: NEGATIVE
Protein, ur: 30 mg/dL — AB
Specific Gravity, Urine: 1.012 (ref 1.005–1.030)
pH: 5 (ref 5.0–8.0)

## 2020-05-24 LAB — RESPIRATORY PANEL BY RT PCR (FLU A&B, COVID)
Influenza A by PCR: NEGATIVE
Influenza B by PCR: NEGATIVE
SARS Coronavirus 2 by RT PCR: NEGATIVE

## 2020-05-24 LAB — I-STAT ARTERIAL BLOOD GAS, ED
Acid-Base Excess: 10 mmol/L — ABNORMAL HIGH (ref 0.0–2.0)
Bicarbonate: 38.6 mmol/L — ABNORMAL HIGH (ref 20.0–28.0)
Calcium, Ion: 1.11 mmol/L — ABNORMAL LOW (ref 1.15–1.40)
HCT: 37 % (ref 36.0–46.0)
Hemoglobin: 12.6 g/dL (ref 12.0–15.0)
O2 Saturation: 90 %
Patient temperature: 99
Potassium: 3.3 mmol/L — ABNORMAL LOW (ref 3.5–5.1)
Sodium: 139 mmol/L (ref 135–145)
TCO2: 41 mmol/L — ABNORMAL HIGH (ref 22–32)
pCO2 arterial: 76.8 mmHg (ref 32.0–48.0)
pH, Arterial: 7.31 — ABNORMAL LOW (ref 7.350–7.450)
pO2, Arterial: 68 mmHg — ABNORMAL LOW (ref 83.0–108.0)

## 2020-05-24 LAB — BASIC METABOLIC PANEL
Anion gap: 14 (ref 5–15)
BUN: 16 mg/dL (ref 8–23)
CO2: 34 mmol/L — ABNORMAL HIGH (ref 22–32)
Calcium: 8.7 mg/dL — ABNORMAL LOW (ref 8.9–10.3)
Chloride: 89 mmol/L — ABNORMAL LOW (ref 98–111)
Creatinine, Ser: 0.9 mg/dL (ref 0.44–1.00)
GFR, Estimated: >60 mL/min (ref >60)
Glucose, Bld: 149 mg/dL — ABNORMAL HIGH (ref 70–99)
Potassium: 4 mmol/L (ref 3.5–5.1)
Sodium: 137 mmol/L (ref 135–145)

## 2020-05-24 LAB — CBC WITH DIFFERENTIAL/PLATELET
Abs Immature Granulocytes: 0.2 10*3/uL — ABNORMAL HIGH (ref 0.00–0.07)
Band Neutrophils: 8 %
Basophils Absolute: 0 10*3/uL (ref 0.0–0.1)
Basophils Relative: 0 %
Eosinophils Absolute: 0 10*3/uL (ref 0.0–0.5)
Eosinophils Relative: 0 %
HCT: 40.3 % (ref 36.0–46.0)
Hemoglobin: 11.6 g/dL — ABNORMAL LOW (ref 12.0–15.0)
Lymphocytes Relative: 0 %
Lymphs Abs: 0 10*3/uL — ABNORMAL LOW (ref 0.7–4.0)
MCH: 27 pg (ref 26.0–34.0)
MCHC: 28.8 g/dL — ABNORMAL LOW (ref 30.0–36.0)
MCV: 93.7 fL (ref 80.0–100.0)
Monocytes Absolute: 0.5 10*3/uL (ref 0.1–1.0)
Monocytes Relative: 3 %
Neutro Abs: 16.4 10*3/uL — ABNORMAL HIGH (ref 1.7–7.7)
Neutrophils Relative %: 88 %
Platelets: 345 10*3/uL (ref 150–400)
Promyelocytes Relative: 1 %
RBC: 4.3 MIL/uL (ref 3.87–5.11)
RDW: 15.1 % (ref 11.5–15.5)
WBC: 17.1 10*3/uL — ABNORMAL HIGH (ref 4.0–10.5)
nRBC: 0 /100 WBC
nRBC: 0.3 % — ABNORMAL HIGH (ref 0.0–0.2)

## 2020-05-24 LAB — APTT: aPTT: 26 seconds (ref 24–36)

## 2020-05-24 LAB — TROPONIN I (HIGH SENSITIVITY)
Troponin I (High Sensitivity): 12 ng/L (ref <18)
Troponin I (High Sensitivity): 20 ng/L — ABNORMAL HIGH (ref <18)

## 2020-05-24 LAB — LACTIC ACID, PLASMA: Lactic Acid, Venous: 2.3 mmol/L (ref 0.5–1.9)

## 2020-05-24 LAB — PROTIME-INR
INR: 1.3 — ABNORMAL HIGH (ref 0.8–1.2)
Prothrombin Time: 15.5 seconds — ABNORMAL HIGH (ref 11.4–15.2)

## 2020-05-24 LAB — BRAIN NATRIURETIC PEPTIDE: B Natriuretic Peptide: 198.6 pg/mL — ABNORMAL HIGH (ref 0.0–100.0)

## 2020-05-24 MED ORDER — SODIUM CHLORIDE 0.9 % IV SOLN
2.0000 g | INTRAVENOUS | Status: AC
  Administered 2020-05-25 – 2020-05-28 (×4): 2 g via INTRAVENOUS
  Filled 2020-05-24 (×2): qty 2
  Filled 2020-05-24 (×2): qty 20
  Filled 2020-05-24: qty 2

## 2020-05-24 MED ORDER — ALBUTEROL SULFATE (2.5 MG/3ML) 0.083% IN NEBU: 2.5000 mg | INHALATION_SOLUTION | RESPIRATORY_TRACT | Status: DC | PRN

## 2020-05-24 MED ORDER — ENOXAPARIN SODIUM 40 MG/0.4ML ~~LOC~~ SOLN
40.0000 mg | SUBCUTANEOUS | Status: DC
  Administered 2020-05-25 – 2020-06-01 (×9): 40 mg via SUBCUTANEOUS
  Filled 2020-05-24 (×9): qty 0.4

## 2020-05-24 MED ORDER — FUROSEMIDE 10 MG/ML IJ SOLN
40.0000 mg | Freq: Once | INTRAMUSCULAR | Status: AC
  Administered 2020-05-24: 40 mg via INTRAVENOUS
  Filled 2020-05-24: qty 4

## 2020-05-24 MED ORDER — IPRATROPIUM BROMIDE 0.02 % IN SOLN
0.5000 mg | Freq: Once | RESPIRATORY_TRACT | Status: DC
  Filled 2020-05-24: qty 2.5

## 2020-05-24 MED ORDER — SODIUM CHLORIDE 0.9% FLUSH
3.0000 mL | Freq: Two times a day (BID) | INTRAVENOUS | Status: DC
  Administered 2020-05-25 – 2020-06-01 (×8): 3 mL via INTRAVENOUS

## 2020-05-24 MED ORDER — SODIUM CHLORIDE 0.9 % IV SOLN
500.0000 mg | INTRAVENOUS | Status: DC
  Filled 2020-05-24: qty 500

## 2020-05-24 MED ORDER — LACTATED RINGERS IV BOLUS
500.0000 mL | Freq: Once | INTRAVENOUS | Status: AC
  Administered 2020-05-24: 500 mL via INTRAVENOUS

## 2020-05-24 MED ORDER — IPRATROPIUM BROMIDE 0.02 % IN SOLN
RESPIRATORY_TRACT | Status: AC
  Administered 2020-05-24: 0.5 mg
  Filled 2020-05-24: qty 2.5

## 2020-05-24 MED ORDER — ACETAMINOPHEN 650 MG RE SUPP: 650.0000 mg | Freq: Four times a day (QID) | RECTAL | Status: DC | PRN

## 2020-05-24 MED ORDER — DILTIAZEM HCL ER COATED BEADS 240 MG PO CP24
240.0000 mg | ORAL_CAPSULE | Freq: Every day | ORAL | Status: DC
  Filled 2020-05-24: qty 1

## 2020-05-24 MED ORDER — SODIUM CHLORIDE 0.9 % IV SOLN
500.0000 mg | INTRAVENOUS | Status: DC
  Administered 2020-05-24 – 2020-05-25 (×2): 500 mg via INTRAVENOUS
  Filled 2020-05-24 (×2): qty 500

## 2020-05-24 MED ORDER — METHYLPREDNISOLONE SODIUM SUCC 125 MG IJ SOLR
125.0000 mg | Freq: Once | INTRAMUSCULAR | Status: AC
  Administered 2020-05-24: 125 mg via INTRAVENOUS
  Filled 2020-05-24: qty 2

## 2020-05-24 MED ORDER — SODIUM CHLORIDE 0.9 % IV SOLN
2.0000 g | INTRAVENOUS | Status: DC
  Administered 2020-05-24: 2 g via INTRAVENOUS
  Filled 2020-05-24: qty 20

## 2020-05-24 MED ORDER — ACETAMINOPHEN 325 MG PO TABS
650.0000 mg | ORAL_TABLET | Freq: Four times a day (QID) | ORAL | Status: DC | PRN
  Administered 2020-05-25 – 2020-05-28 (×4): 650 mg via ORAL
  Filled 2020-05-24 (×4): qty 2

## 2020-05-24 MED ORDER — ALBUTEROL (5 MG/ML) CONTINUOUS INHALATION SOLN
15.0000 mg/h | INHALATION_SOLUTION | RESPIRATORY_TRACT | Status: DC
  Administered 2020-05-24: 15 mg/h via RESPIRATORY_TRACT
  Filled 2020-05-24: qty 20

## 2020-05-24 MED ORDER — IPRATROPIUM-ALBUTEROL 0.5-2.5 (3) MG/3ML IN SOLN
3.0000 mL | Freq: Four times a day (QID) | RESPIRATORY_TRACT | Status: DC
  Administered 2020-05-24 – 2020-05-25 (×3): 3 mL via RESPIRATORY_TRACT
  Filled 2020-05-24 (×3): qty 3

## 2020-05-24 NOTE — Progress Notes (Signed)
Pt's mask changed to medium d/t leak.

## 2020-05-24 NOTE — ED Provider Notes (Signed)
[facility] EMERGENCY DEPARTMENT Provider Note   CSN: [id number] Arrival date & time: 05/24/20  1732     History Chief Complaint  Patient presents with  . Respiratory Distress    Deborah [name] is a 64 y.o. female.  The history is provided by the patient.  Shortness of Breath Severity:  Severe Onset quality:  Gradual Duration:  1 day Timing:  Constant Progression:  Worsening Chronicity:  New Relieved by:  Nothing Worsened by:  Exertion      Past Medical History:  Diagnosis Date  . Anemia    as teen  . Asthma   . Breast cancer ([place])    left breast cancer 02/2020  . CHF (congestive heart failure) ([place])   . COPD, mild ([place])   . Depression   . Diverticulitis   . Family history of anesthesia complication    vomiting  . GI bleeding   . Heart failure ([place])    New onset 07/25/14  . Histoplasmosis    left eye  . Hyperkalemia   . Hypertension   . Lung cancer ([place]) 05/02/2018   s/p chemoradiation, immunotherapy  . Obesity (BMI 30-39.9)   . Pneumonia    dx wtih pneumonia on 05/27/16- seen by [facility]   . PONV (postoperative nausea and vomiting)   . Restrictive lung disease   . Shortness of breath    with exertion   . Sleep apnea    mask and oxygen at nite for sleep at 2L   . Tobacco abuse   . Umbilical hernia     Patient Active Problem List   Diagnosis Date Noted  . Acute on chronic respiratory failure with hypoxia ([place]) 05/24/2020  . Port-A-Cath in place 05/11/2020  . Malignant neoplasm of upper-outer quadrant of left breast in female, estrogen receptor negative ([place]) 02/25/2020  . Surgical counseling visit 02/23/2020  . Physical deconditioning 02/23/2020  . Rash and nonspecific skin eruption 12/16/2019  . Hyperglycemia 11/12/2019  . Palpitations 11/12/2019  . Encounter for antineoplastic immunotherapy 08/14/2018  . Primary malignant neoplasm of bronchus of left lower lobe ([place]) 05/02/2018  . Encounter for antineoplastic chemotherapy  05/02/2018  . Goals of care, counseling/discussion 05/02/2018  . Choledocholithiasis   . COPD (chronic obstructive pulmonary disease) ([place]) 01/01/2017  . Special screening for malignant neoplasms, colon   . Benign neoplasm of cecum   . Benign neoplasm of ascending colon   . Benign neoplasm of descending colon   . Benign neoplasm of sigmoid colon   . Depression 04/20/2016  . GI bleeding 01/08/2016  . Pressure ulcer 01/08/2016  . Diverticulitis of colon 01/08/2016  . COPD exacerbation ([place]) 12/18/2015  . Vitamin D deficiency 03/29/2015  . Fatigue 10/27/2014  . Chronic diastolic heart failure ([place]) 09/01/2014  . OSA (obstructive sleep apnea) 08/26/2014  . Hypoxemia   . Pulmonary HTN ([place])   . CHF, acute on chronic, unknown EF 07/25/2014  . CRLD (chronic restrictive lung disease) secondary to super morbid obesity with mild COPD component 07/25/2014  . Tobacco abuse disorder 07/25/2014  . Morbid obesity ([place]) 06/05/2013  . Dyspnea 04/17/2013  . Chronic respiratory failure ([place]) 04/17/2013  . Hypertension 04/09/2013  . Umbilical hernia     Past Surgical History:  Procedure Laterality Date  . APPLICATION OF WOUND VAC  03/19/2013   Procedure: APPLICATION OF WOUND VAC;  Surgeon: Madilyn Hook, DO;  Location: [facility];  Service: General;;  . BREAST LUMPECTOMY WITH RADIOACTIVE SEED AND SENTINEL LYMPH NODE BIOPSY Left  04/08/2020   Procedure: LEFT BREAST LUMPECTOMY WITH RADIOACTIVE SEED AND LEFT AXILLARY SENTINEL LYMPH NODE BIOPSY;  Surgeon: Alphonsa Overall, MD;  Location: [facility];  Service: General;  Laterality: Left;  PEC BLOCK, NEEDS ANESTHESIA CONSULT  . CESAREAN SECTION  04/12/85  . COLONOSCOPY WITH PROPOFOL N/A 06/13/2016   Procedure: COLONOSCOPY WITH PROPOFOL;  Surgeon: Jerene Bears, MD;  Location: [facility];  Service: Gastroenterology;  Laterality: N/A;  . ERCP N/A 02/15/2017   Procedure: ENDOSCOPIC RETROGRADE CHOLANGIOPANCREATOGRAPHY (ERCP);  Surgeon: [name], MD;  Location: [facility];  Service: Endoscopy;  Laterality: N/A;  . INSERTION OF MESH N/A 03/19/2013   Procedure: INSERTION OF MESH;  Surgeon: Madilyn Hook, DO;  Location: [facility];  Service: General;  Laterality: N/A;  . IR IMAGING GUIDED PORT INSERTION  03/26/2020  . VENTRAL HERNIA REPAIR N/A 03/19/2013   Procedure:  OPEN VENTRAL HERNIA REPAIR WITH MESH AND APPLICATION OF WOUND VAC;  Surgeon: Madilyn Hook, DO;  Location: [facility];  Service: General;  Laterality: N/A;  . VIDEO BRONCHOSCOPY Bilateral 04/19/2018   Procedure: VIDEO BRONCHOSCOPY WITH FLUORO;  Surgeon: Rigoberto Noel, MD;  Location: [facility];  Service: Cardiopulmonary;  Laterality: Bilateral;     OB History    Gravida  0   Para  0   Term  0   Preterm  0   AB  0   Living        SAB  0   TAB  0   Ectopic  0   Multiple      Live Births              Family History  Problem Relation Age of Onset  . Heart attack Mother   . Emphysema Mother        was a smoker  . Congestive Heart Failure Father   . Bipolar disorder Father   . Lymphoma Maternal Uncle   . Pancreatic cancer Maternal Aunt   . Bipolar disorder Sister   . Schizophrenia Sister   . Other Sister        tumor in her neck  . Melanoma Sister   . Heart attack Maternal Uncle   . Heart attack Maternal Uncle   . Colon cancer Neg Hx   . Breast cancer Neg Hx     Social History   Tobacco Use  . Smoking status: Former Smoker    Packs/day: 3.00    Years: 43.00    Pack years: 129.00    Types: Cigarettes    Quit date: 12/17/2012    Years since quitting: 7.4  . Smokeless tobacco: Never Used  Vaping Use  . Vaping Use: Former  Substance Use Topics  . Alcohol use: No    Alcohol/week: 0.0 standard drinks  . Drug use: No    Home Medications Prior to Admission medications   Medication Sig Start Date End Date Taking? Authorizing Provider  acetaminophen (TYLENOL) 325 MG tablet Take 650 mg by mouth every 6 (six) hours as needed for headache (pain).   Yes [provider]  albuterol (VENTOLIN HFA) 108 (90 Base) MCG/ACT inhaler Inhale 2 puffs into the lungs every 6 (six) hours as needed for wheezing or shortness of breath. 01/16/20  Yes Brand Males, MD  aspirin 81 MG chewable tablet Chew 1 tablet (81 mg total) by mouth daily. 10/05/14  Yes [name], MD  Aspirin-Caffeine [facility] FAST PAIN RELIEF ARTHRITIS PO) Take 1 packet by mouth daily as needed (back pain).  Yes [provider]  Budeson-Glycopyrrol-Formoterol (BREZTRI AEROSPHERE) 160-9-4.8 MCG/ACT AERO Inhale 2 puffs into the lungs daily. Patient taking differently: Inhale 2 puffs into the lungs daily as needed (shortness of breath/wheezing).  01/14/20  Yes Brand Males, MD  diltiazem (CARDIZEM CD) 240 MG 24 hr capsule TAKE 1 CAPSULE BY MOUTH EVERY DAY Patient taking differently: Take 240 mg by mouth daily.  05/21/20  Yes [name], MD  furosemide (LASIX) 20 MG tablet TAKE 2 TABLETS BY MOUTH EVERY DAY Patient taking differently: Take 40 mg by mouth daily.  05/03/20  Yes Pleas Koch, NP  ipratropium-albuterol (DUONEB) 0.5-2.5 (3) MG/3ML SOLN Take 3 mLs by nebulization every 3 (three) hours as needed. Patient taking differently: Take 3 mLs by nebulization every 3 (three) hours as needed (shortness of breath/wheezing).  04/14/20  Yes Hunsucker, Bonna Gains, MD  lidocaine-prilocaine (EMLA) cream Apply 1 application topically as needed (prior to port access).   Yes [provider]  montelukast (SINGULAIR) 10 MG tablet Take 1 tablet (10 mg total) by mouth at bedtime. 12/31/19  Yes Pleas Koch, NP  OXYGEN Inhale 4 L/min into the lungs continuous.    Yes [provider]  potassium chloride SA (KLOR-CON M20) 20 MEQ tablet TAKE 1/2 TABLET BY MOUTH EVERY DAY Patient taking differently: Take 10 mEq by mouth daily.  05/18/20  Yes Pleas Koch, NP  traMADol (ULTRAM) 50 MG tablet Take 1 tablet (50 mg total) by mouth every 6 (six) hours as needed for moderate  pain or severe pain. 04/08/20  Yes Alphonsa Overall, MD  Spacer/Aero-Holding Chambers (AEROCHAMBER MV) inhaler Use as instructed 01/01/17   Parrett, Fonnie Mu, NP  prochlorperazine (COMPAZINE) 10 MG tablet Take 1 tablet (10 mg total) by mouth every 6 (six) hours as needed (Nausea or vomiting). 04/19/20 05/18/20  Nicholas Lose, MD    Allergies    Patient has no known allergies.  Review of Systems   Review of Systems  Unable to perform ROS: Severe respiratory distress  Respiratory: Positive for shortness of breath.     Physical Exam Updated Vital Signs BP 106/81   Pulse (!) 144   Temp 99 F (37.2 C) (Axillary)   Resp (!) 28   SpO2 92%   Physical Exam Vitals and nursing note reviewed.  Constitutional:      General: She is in acute distress.     Appearance: She is well-developed. She is obese. She is ill-appearing.  HENT:     Head: Normocephalic and atraumatic.     Mouth/Throat:     Mouth: Mucous membranes are dry.     Pharynx: Oropharynx is clear.  Eyes:     Conjunctiva/sclera: Conjunctivae normal.  Cardiovascular:     Rate and Rhythm: Regular rhythm. Tachycardia present.     Pulses:          Radial pulses are 2+ on the right side and 2+ on the left side.       Dorsalis pedis pulses are 2+ on the right side and 2+ on the left side.     Heart sounds: No murmur heard.  No gallop.   Pulmonary:     Effort: Tachypnea and accessory muscle usage present. No respiratory distress.     Breath sounds: Examination of the left-upper field reveals decreased breath sounds. Examination of the left-middle field reveals decreased breath sounds. Examination of the left-lower field reveals decreased breath sounds. Decreased breath sounds and rhonchi (diffuse) present.  Abdominal:     Palpations:  Abdomen is soft.     Tenderness: There is no abdominal tenderness.  Musculoskeletal:     Cervical back: Neck supple.     Right lower leg: 2+ Edema present.     Left lower leg: 2+ Edema present.  Skin:     General: Skin is warm and dry.  Neurological:     General: No focal deficit present.     Mental Status: She is alert.     GCS: GCS eye subscore is 4. GCS verbal subscore is 5. GCS motor subscore is 6.     ED Results / Procedures / Treatments   Labs (all labs ordered are listed, but only abnormal results are displayed) Labs Reviewed  BASIC METABOLIC PANEL - Abnormal; Notable for the following components:      Result Value   Chloride 89 (*)    CO2 34 (*)    Glucose, Bld 149 (*)    Calcium 8.7 (*)    All other components within normal limits  CBC WITH DIFFERENTIAL/PLATELET - Abnormal; Notable for the following components:   WBC 17.1 (*)    Hemoglobin 11.6 (*)    MCHC 28.8 (*)    nRBC 0.3 (*)    Neutro Abs 16.4 (*)    Lymphs Abs 0.0 (*)    Abs Immature Granulocytes 0.20 (*)    All other components within normal limits  BRAIN NATRIURETIC PEPTIDE - Abnormal; Notable for the following components:   B Natriuretic Peptide 198.6 (*)    All other components within normal limits  URINALYSIS, ROUTINE W REFLEX MICROSCOPIC - Abnormal; Notable for the following components:   Color, Urine AMBER (*)    APPearance HAZY (*)    Hgb urine dipstick SMALL (*)    Protein, ur 30 (*)    Bacteria, UA RARE (*)    All other components within normal limits  I-STAT ARTERIAL BLOOD GAS, ED - Abnormal; Notable for the following components:   pH, Arterial 7.310 (*)    pCO2 arterial 76.8 (*)    pO2, Arterial 68 (*)    Bicarbonate 38.6 (*)    TCO2 41 (*)    Acid-Base Excess 10.0 (*)    Potassium 3.3 (*)    Calcium, Ion 1.11 (*)    All other components within normal limits  RESPIRATORY PANEL BY RT PCR (FLU A&B, COVID)  URINE CULTURE  CULTURE, BLOOD (ROUTINE X 2)  CULTURE, BLOOD (ROUTINE X 2)  MRSA PCR SCREENING  LACTIC ACID, PLASMA  LACTIC ACID, PLASMA  PROTIME-INR  APTT  BLOOD GAS, ARTERIAL  HIV ANTIBODY (ROUTINE TESTING W REFLEX)  LEGIONELLA PNEUMOPHILA SEROGP 1 UR AG  STREP PNEUMONIAE URINARY  ANTIGEN  COMPREHENSIVE METABOLIC PANEL  CBC  LACTATE DEHYDROGENASE  TROPONIN I (HIGH SENSITIVITY)  TROPONIN I (HIGH SENSITIVITY)    EKG None  Radiology DG Chest Port 1 View  Result Date: 05/24/2020 CLINICAL DATA:  64 year old female with shortness of breath. EXAM: PORTABLE CHEST 1 VIEW COMPARISON:  Chest radiograph dated 04/19/2018 and CT dated 01/26/2020. FINDINGS: Right-sided Port-A-Cath with tip close to the cavoatrial junction. There is small to moderate left pleural effusion with collapse of the majority of the left lung. There is diffuse interstitial and streaky density in the aerated portion of the left lung which may represent atelectasis, asymmetric edema, or pneumonia. Clinical correlation is recommended. There is background of emphysema. Faint right lung base reticulonodular densities, likely atelectasis. There is no pneumothorax. Stable cardiomediastinal silhouette. Atherosclerotic calcification of the aorta. No acute osseous pathology.  IMPRESSION: 1. Small to moderate left pleural effusion with collapse of the majority of the left lung. 2. Diffuse interstitial and streaky density in the aerated portion of the left lung may represent atelectasis, asymmetric edema, or pneumonia. Electronically Signed   By: Anner Crete M.D.   On: 05/24/2020 17:59    Procedures Procedures (including critical care time)  Medications Ordered in ED Medications  albuterol (PROVENTIL,VENTOLIN) solution continuous neb (15 mg/hr Nebulization New Bag/Given 05/24/20 1912)  ipratropium (ATROVENT) nebulizer solution 0.5 mg (has no administration in time range)  diltiazem (CARDIZEM CD) 24 hr capsule 240 mg (has no administration in time range)  cefTRIAXone (ROCEPHIN) 2 g in sodium chloride 0.9 % 100 mL IVPB (has no administration in time range)  azithromycin (ZITHROMAX) 500 mg in sodium chloride 0.9 % 250 mL IVPB (has no administration in time range)  ipratropium-albuterol (DUONEB) 0.5-2.5 (3) MG/3ML  nebulizer solution 3 mL (has no administration in time range)  albuterol (PROVENTIL) (2.5 MG/3ML) 0.083% nebulizer solution 2.5 mg (has no administration in time range)  enoxaparin (LOVENOX) injection 40 mg (has no administration in time range)  sodium chloride flush (NS) 0.9 % injection 3 mL (has no administration in time range)  acetaminophen (TYLENOL) tablet 650 mg (has no administration in time range)    Or  acetaminophen (TYLENOL) suppository 650 mg (has no administration in time range)  methylPREDNISolone sodium succinate (SOLU-MEDROL) 125 mg/2 mL injection 125 mg (125 mg Intravenous Given 05/24/20 1835)  furosemide (LASIX) injection 40 mg (40 mg Intravenous Given 05/24/20 1839)  ipratropium (ATROVENT) 0.02 % nebulizer solution (0.5 mg  Given 05/24/20 1913)  lactated ringers bolus 500 mL (500 mLs Intravenous New Bag/Given 05/24/20 2059)    ED Course  I have reviewed the triage vital signs and the nursing notes.  Pertinent labs & imaging results that were available during my care of the patient were reviewed by me and considered in my medical decision making (see chart for details).    MDM Rules/Calculators/A&P                          The patient is a 64yo female, PMH COPD, CHF, HTN, breast cancer recently on chemo who presents to the ED via EMS for SOB.  On my initial evaluation, the patient is markedly tachycardic to 144bpm, normotensive, afebrile, nontoxic-appearing. Physical exam remarkable for tachypnea, increased WOB, requiring Bipap.  Differentials considered include COPD exacerbation, CHF exacerbation, ACS, PE, pneumonia.  Likely a combination of COPD and CHF exacerbation since patient has been noncompliant with Lasix, recently been on Adriamycin, and history of COPD with chronic hypoxic respiratory failure.  IV Solu-Medrol and duo nebs provided for concern for COPD exacerbation.  IV Lasix provided for concern for CHF exacerbation.  EKG with sinus tachycardia, no obvious ST or T  wave changes concerning for acute ischemia, QTC 428 ms..   Patient provided IV Lasix for concern for CHF exacerbation, IV Solu-Medrol and duo nebs for concern for COPD exacerbation. Labs remarkable for leukocytosis and chest x-ray remarkable for large left lung effusion.  Due to history of left-sided breast cancer and left-sided lung cancer, unclear if this represents infectious or malignant cause.  Due to leukocytosis and tachycardia, activated sepsis protocol and provided IV Rocephin and azithromycin for concern for pneumonia.  While pending other labs and with known history of CHF, provided only small IV fluid bolus and will defer 30 cc/kg bolus at this time.  Other labs remarkable for  BNP at 198, not significantly different from prior readings and normal troponin with negative COVID.  Consulted hospitalist for admission for acute on chronic hypoxic respiratory failure due to a left pleural effusion that may be infectious or malignant, also with concern for COPD or CHF exacerbation.  Patient accepted to the hospitalist service on the stepdown unit.  The care of this patient was overseen by Dr. Laverta Baltimore, who agreed with evaluation and plan of care.   Final Clinical Impression(s) / ED Diagnoses Final diagnoses:  Pleural effusion, left  Acute hypoxemic respiratory failure [facility])    Rx / DC Orders ED Discharge Orders    None       Launa Flight, MD 05/24/20 2133    Margette Fast, MD 05/27/20 [phone]

## 2020-05-24 NOTE — ED Notes (Signed)
Admitting provider at bedside.

## 2020-05-24 NOTE — H&P (Addendum)
History and Physical   Deborah [name] KVQ:[id number] DOB: [birth year] DOA: 05/24/2020  PCP: Pleas Koch, NP   Patient coming from: Home  Chief Complaint: Shortness of breath  HPI: Deborah [name] is a 64 y.o. female with medical history significant of left-sided breast cancer status post lumpectomy on adjuvant chemotherapy, left lower lobe lung cancer, CHF with preserved ejection fraction, COPD on chronic 4 L home O2, hypertension, OSA with pulmonary hypertension who presents with progressively worsening hypoxia and shortness of breath.  Patient states that she has had a couple weeks of slowly worsening shortness of breath and has noticed that in that time she would have desaturations on her home pulse ox after taking just short walks despite her home 4 L of oxygen.  This has progressed more rapidly in the past week and due to concern over falling as a consequent of hypoxia she stopped taking her Lasix on Friday to prevent her from having to get up and go to the bathroom.  She progressed to being short of breath at rest and called EMS.  On EMS arrival she was saturating in the 70s on her home 4 L and was placed on CPAP with improvement.  She denies fevers, chest pain, abdominal pain, constipation, diarrhea, nausea.  Of note she has had chronic tachycardia since starting chemotherapy at 1 point in the 130s to 140s; but more recently it has been in the 110s per chart review of recent outpatient visits.  ED Course: Vitals notable for heart rate in the 130s to 140s, respirations of in the upper 64P to 32R, systolic blood pressure in the 100s to 110s, and hypoxia requiring BiPAP to maintain saturations.  Lab work showed bicarb of 34, Hgb stable at 11.6, leukocytosis to 17.  BNP 198, troponin trend 12 with repeat pending, respiratory panel for Covid and flu negative.  Lactic acid, PT PTT INR, urinalysis, urine cultures, blood cultures ordered but pending.  Chest x-ray showed small to moderate left  pleural effusion with associated lung collapse and diffuse density of the left lobe representing pneumonia versus edema versus atelectasis.  Patient received ceftriaxone, azithromycin, 500 cc fluid bolus, Solu-Medrol, Atrovent and albuterol in ED.  Review of Systems: As per HPI otherwise all other systems reviewed and are negative.  Past Medical History:  Diagnosis Date  . Anemia    as teen  . Asthma   . Breast cancer ([place])    left breast cancer 02/2020  . CHF (congestive heart failure) ([place])   . COPD, mild ([place])   . Depression   . Diverticulitis   . Family history of anesthesia complication    vomiting  . GI bleeding   . Heart failure ([place])    New onset 07/25/14  . Histoplasmosis    left eye  . Hyperkalemia   . Hypertension   . Lung cancer ([place]) 05/02/2018   s/p chemoradiation, immunotherapy  . Obesity (BMI 30-39.9)   . Pneumonia    dx wtih pneumonia on 05/27/16- seen by [facility]   . PONV (postoperative nausea and vomiting)   . Restrictive lung disease   . Shortness of breath    with exertion   . Sleep apnea    mask and oxygen at nite for sleep at 2L   . Tobacco abuse   . Umbilical hernia     Past Surgical History:  Procedure Laterality Date  . APPLICATION OF WOUND VAC  03/19/2013   Procedure: APPLICATION OF WOUND VAC;  Surgeon: Aaron Edelman  Lilyan Punt, DO;  Location: [facility];  Service: General;;  . BREAST LUMPECTOMY WITH RADIOACTIVE SEED AND SENTINEL LYMPH NODE BIOPSY Left 04/08/2020   Procedure: LEFT BREAST LUMPECTOMY WITH RADIOACTIVE SEED AND LEFT AXILLARY SENTINEL LYMPH NODE BIOPSY;  Surgeon: Alphonsa Overall, MD;  Location: [facility];  Service: General;  Laterality: Left;  PEC BLOCK, NEEDS ANESTHESIA CONSULT  . CESAREAN SECTION  04/12/85  . COLONOSCOPY WITH PROPOFOL N/A 06/13/2016   Procedure: COLONOSCOPY WITH PROPOFOL;  Surgeon: Jerene Bears, MD;  Location: [facility];  Service: Gastroenterology;  Laterality: N/A;  . ERCP N/A 02/15/2017   Procedure: ENDOSCOPIC RETROGRADE  CHOLANGIOPANCREATOGRAPHY (ERCP);  Surgeon: [name], MD;  Location: [facility];  Service: Endoscopy;  Laterality: N/A;  . INSERTION OF MESH N/A 03/19/2013   Procedure: INSERTION OF MESH;  Surgeon: Madilyn Hook, DO;  Location: [facility];  Service: General;  Laterality: N/A;  . IR IMAGING GUIDED PORT INSERTION  03/26/2020  . VENTRAL HERNIA REPAIR N/A 03/19/2013   Procedure:  OPEN VENTRAL HERNIA REPAIR WITH MESH AND APPLICATION OF WOUND VAC;  Surgeon: Madilyn Hook, DO;  Location: [facility];  Service: General;  Laterality: N/A;  . VIDEO BRONCHOSCOPY Bilateral 04/19/2018   Procedure: VIDEO BRONCHOSCOPY WITH FLUORO;  Surgeon: Rigoberto Noel, MD;  [facility];  Service: Cardiopulmonary;  Laterality: Bilateral;    Social History  reports that she quit smoking about 7 years ago. Her smoking use included cigarettes. She has a 129.00 pack-year smoking history. She has never used smokeless tobacco. She reports that she does not drink alcohol and does not use drugs.  No Known Allergies  Family History  Problem Relation Age of Onset  . Heart attack Mother   . Emphysema Mother        was a smoker  . Congestive Heart Failure Father   . Bipolar disorder Father   . Lymphoma Maternal Uncle   . Pancreatic cancer Maternal Aunt   . Bipolar disorder Sister   . Schizophrenia Sister   . Other Sister        tumor in her neck  . Melanoma Sister   . Heart attack Maternal Uncle   . Heart attack Maternal Uncle   . Colon cancer Neg Hx   . Breast cancer Neg Hx   Reviewed on admission  Prior to Admission medications   Medication Sig Start Date End Date Taking? Authorizing Provider  acetaminophen (TYLENOL) 325 MG tablet Take 650 mg by mouth every 6 (six) hours as needed for headache (pain).   Yes [provider]  albuterol (VENTOLIN HFA) 108 (90 Base) MCG/ACT inhaler Inhale 2 puffs into the lungs every 6 (six) hours as needed for wheezing or shortness of breath. 01/16/20  Yes Brand Males,  MD  aspirin 81 MG chewable tablet Chew 1 tablet (81 mg total) by mouth daily. 10/05/14  Yes [name], MD  Aspirin-Caffeine [facility] FAST PAIN RELIEF ARTHRITIS PO) Take 1 packet by mouth daily as needed (back pain).   Yes [provider]  Budeson-Glycopyrrol-Formoterol (BREZTRI AEROSPHERE) 160-9-4.8 MCG/ACT AERO Inhale 2 puffs into the lungs daily. Patient taking differently: Inhale 2 puffs into the lungs daily as needed (shortness of breath/wheezing).  01/14/20  Yes Brand Males, MD  diltiazem (CARDIZEM CD) 240 MG 24 hr capsule TAKE 1 CAPSULE BY MOUTH EVERY DAY Patient taking differently: Take 240 mg by mouth daily.  05/21/20  Yes [name], MD  furosemide (LASIX) 20 MG tablet TAKE 2 TABLETS BY MOUTH EVERY DAY Patient  taking differently: Take 40 mg by mouth daily.  05/03/20  Yes Pleas Koch, NP  ipratropium-albuterol (DUONEB) 0.5-2.5 (3) MG/3ML SOLN Take 3 mLs by nebulization every 3 (three) hours as needed. Patient taking differently: Take 3 mLs by nebulization every 3 (three) hours as needed (shortness of breath/wheezing).  04/14/20  Yes Hunsucker, Bonna Gains, MD  lidocaine-prilocaine (EMLA) cream Apply 1 application topically as needed (prior to port access).   Yes [provider]  montelukast (SINGULAIR) 10 MG tablet Take 1 tablet (10 mg total) by mouth at bedtime. 12/31/19  Yes Pleas Koch, NP  OXYGEN Inhale 4 L/min into the lungs continuous.    Yes [provider]  potassium chloride SA (KLOR-CON M20) 20 MEQ tablet TAKE 1/2 TABLET BY MOUTH EVERY DAY Patient taking differently: Take 10 mEq by mouth daily.  05/18/20  Yes Pleas Koch, NP  traMADol (ULTRAM) 50 MG tablet Take 1 tablet (50 mg total) by mouth every 6 (six) hours as needed for moderate pain or severe pain. 04/08/20  Yes Alphonsa Overall, MD  Spacer/Aero-Holding Chambers (AEROCHAMBER MV) inhaler Use as instructed 01/01/17   Parrett, Fonnie Mu, NP  prochlorperazine (COMPAZINE) 10 MG tablet  Take 1 tablet (10 mg total) by mouth every 6 (six) hours as needed (Nausea or vomiting). 04/19/20 05/18/20  Nicholas Lose, MD    Physical Exam: Vitals:   05/24/20 1842 05/24/20 1913 05/24/20 2100 05/24/20 2138  BP: 106/81     Pulse: (!) 138 (!) 133 (!) 144   Resp: (!) 28 (!) 27 (!) 28   Temp:      TempSrc:      SpO2: 95% 97% 92% 96%   Physical Exam Constitutional:      Appearance: She is obese.     Comments: Ill-appearing female  HENT:     Head: Normocephalic and atraumatic.     Mouth/Throat:     Mouth: Mucous membranes are moist.     Pharynx: Oropharynx is clear.  Eyes:     Extraocular Movements: Extraocular movements intact.     Pupils: Pupils are equal, round, and reactive to light.  Cardiovascular:     Rate and Rhythm: Regular rhythm. Tachycardia present.     Pulses: Normal pulses.     Heart sounds: Normal heart sounds.  Pulmonary:     Comments: Tachypneic, on BiPAP Mild increased work of breathing Trace rales right base Rhonchi and wheezing left lower lobe Diminished to absent left base Abdominal:     General: Bowel sounds are normal. There is no distension.     Palpations: Abdomen is soft.     Tenderness: There is no abdominal tenderness.  Musculoskeletal:        General: No swelling or deformity.  Skin:    General: Skin is warm and dry.  Neurological:     General: No focal deficit present.     Mental Status: Mental status is at baseline.    Labs on Admission: I have personally reviewed following labs and imaging studies  CBC: Recent Labs  Lab 05/18/20 1038 05/24/20 1847 05/24/20 2120  WBC 4.9 17.1*  --   NEUTROABS 3.9 16.4*  --   HGB 11.6* 11.6* 12.6  HCT 39.3 40.3 37.0  MCV 94.0 93.7  --   PLT 183 345  --     Basic Metabolic Panel: Recent Labs  Lab 05/18/20 1038 05/24/20 1847 05/24/20 2120  NA 139 137 139  K 4.6 4.0 3.3*  CL 96* 89*  --  CO2 38* 34*  --   GLUCOSE 110* 149*  --   BUN 12 16  --   CREATININE 0.61 0.90  --   CALCIUM 9.0  8.7*  --     GFR: Estimated Creatinine Clearance: 76 mL/min (by C-G formula based on SCr of 0.9 mg/dL).  Liver Function Tests: Recent Labs  Lab 05/18/20 1038  AST 33  ALT 51*  ALKPHOS 124  BILITOT 0.6  PROT 6.9  ALBUMIN 3.1*    Urine analysis:    Component Value Date/Time   COLORURINE AMBER (A) 05/24/2020 2050   APPEARANCEUR HAZY (A) 05/24/2020 2050   LABSPEC 1.012 05/24/2020 2050   PHURINE 5.0 05/24/2020 2050   GLUCOSEU NEGATIVE 05/24/2020 2050   HGBUR SMALL (A) 05/24/2020 2050   BILIRUBINUR NEGATIVE 05/24/2020 2050   KETONESUR NEGATIVE 05/24/2020 2050   PROTEINUR 30 (A) 05/24/2020 2050   UROBILINOGEN 0.2 10/04/2010 2351   NITRITE NEGATIVE 05/24/2020 2050   LEUKOCYTESUR NEGATIVE 05/24/2020 2050    Radiological Exams on Admission: DG Chest Port 1 View  Result Date: 05/24/2020 CLINICAL DATA:  64 year old female with shortness of breath. EXAM: PORTABLE CHEST 1 VIEW COMPARISON:  Chest radiograph dated 04/19/2018 and CT dated 01/26/2020. FINDINGS: Right-sided Port-A-Cath with tip close to the cavoatrial junction. There is small to moderate left pleural effusion with collapse of the majority of the left lung. There is diffuse interstitial and streaky density in the aerated portion of the left lung which may represent atelectasis, asymmetric edema, or pneumonia. Clinical correlation is recommended. There is background of emphysema. Faint right lung base reticulonodular densities, likely atelectasis. There is no pneumothorax. Stable cardiomediastinal silhouette. Atherosclerotic calcification of the aorta. No acute osseous pathology. IMPRESSION: 1. Small to moderate left pleural effusion with collapse of the majority of the left lung. 2. Diffuse interstitial and streaky density in the aerated portion of the left lung may represent atelectasis, asymmetric edema, or pneumonia. Electronically Signed   By: Anner Crete M.D.   On: 05/24/2020 17:59    EKG: Independently reviewed.   Sinus tachycardia  Assessment/Plan Active Problems:   Acute on chronic respiratory failure with hypoxia (HCC)  Acute on chronic respiratory failure with hypoxia Left left pleural effusion Left-sided pneumonia, severe sepsis COPD exacerbation > Patient tachypneic and tachycardic with elevated lactic acid and hypoxia. Of note baseline heart rate has been in the 110s since starting chemotherapy and had been higher due to this in the past.  Baseline blood pressure has been reading in the 19E to 90 systolic since the beginning of October as well. > Has improved on BiPAP per EDP, and does not look uncomfortable with mild increased work of breathing on BiPAP when seen > Received Solu-Medrol in ED, holding off on further steroids for now as pneumonia and effusion seem to be primary driver with only minimal wheezing on exam - Admit to progressive - Have ordered for IR left-sided diagnostic and therapeutic thoracentesis - Labs for thoracentesis fluid to include cytology, culture, LDH, protein - Continuous BiPAP overnight, n.p.o. - Continue ceftriaxone and azithromycin - Strep and Legionella urinary antigens - Scheduled duo nebs and as needed albuterol nebs - Hold off on significant IV fluid bolus as blood pressure is near baseline and has history of heart failure - MRSA screen - AM CMP, CBC, LDH  Heart failure, ?Exacerbation > Has been off home diuretics for about a week, but has not been eating or drinking as much for the past week > Currently on chemotherapy which includes Adriamycin >  BNP indeterminate at 198, though this is in the setting of obesity >?  Trace rales on right, following small fluid bolus of 500 cc in the ED, and carrier fluid of at least 350 cc > Did receive 40 mg of IV Lasix in the ED - Repeat echocardiogram given ongoing Adriamycin therapy and possible heart failure component of presentation - Daily weights, I/O - Follow renal function and electrolytes - Hold home Lasix  for now - Hold diltiazem as patient states she has not been taking this  Left-sided breast cancer  > Triple negative , status post lumpectomy on adjuvant chemotherapy with Adriamycin and Cytoxan. > Has had side effects of fatigue and tachycardia with this regimen > Last visit to oncology on 05/18/2020 with plan to continue regimen for now - Checking echocardiogram as above - Investigating if pleural effusion could be malignant in nature as above  OHS OSA with pulmonary hypertension - BiPAP as above for now  History of lung cancer left lower lobe   DVT prophylaxis: Lovenox  Code Status:   Full  Family Communication:  Husband updated at bedside Disposition Plan:   Patient is from:  Home  Anticipated DC to:  Pending clinical course  Anticipated DC date:  Pending clinical course  Anticipated DC barriers: Multiple comorbidities  Consults called:  None  Admission status:  Inpatient, progressive   Severity of Illness: The appropriate patient status for this patient is INPATIENT. Inpatient status is judged to be reasonable and necessary in order to provide the required intensity of service to ensure the patient's safety. The patient's presenting symptoms, physical exam findings, and initial radiographic and laboratory data in the context of their chronic comorbidities is felt to place them at high risk for further clinical deterioration. Furthermore, it is not anticipated that the patient will be medically stable for discharge from the hospital within 2 midnights of admission. The following factors support the patient status of inpatient.   " The patient's presenting symptoms include hypoxic respiratory failure, shortness of breath, DOE. " The worrisome physical exam findings include rales, rhonchi, wheezing, tachycardia, tachypnea. " The initial radiographic and laboratory data are worrisome because of right-sided pleural effusion with overlying pneumonia, leukocytosis, borderline  BNP. " The chronic co-morbidities include triple negative breast cancer, CHF, COPD, hypertension, OSA.   * I certify that at the point of admission it is my clinical judgment that the patient will require inpatient hospital care spanning beyond 2 midnights from the point of admission due to high intensity of service, high risk for further deterioration and high frequency of surveillance required.Marcelyn Bruins MD [facility]  How to contact the [facility] Attending or Consulting provider [place] or covering provider during after hours [place], for this patient?   1. Check the care team in [facility] and look for a) attending/consulting [facility] provider listed and b) the [facility] team listed 2. Log into [email or web address] and use [facility]'s universal password to access. If you do not have the password, please contact the hospital operator. 3. Locate the [facility] provider you are looking for under [facility] and page to a number that you can be directly reached. 4. If you still have difficulty reaching the provider, please page the [facility] (Director on Call) for the Hospitalists listed on amion for assistance.  05/24/2020, 9:47 PM

## 2020-05-24 NOTE — ED Triage Notes (Signed)
BIB EMS for SOB. Pt was 74% on 6L. Pt was placed on CPAP and oxygenation went up to 85%. Pt tachypneic, tachycardia. Per husband, pt quit taking her lasix last Friday because she was scared of falling while going to the bathroom

## 2020-05-24 NOTE — Progress Notes (Signed)
Communicated with bedside RN Autumn who has been very informative of time of LA, BC and the fact that patient is with limited ( no sticks on left side) and difficult access for IV's and labs. Continue to monitor closely . Noted Lasix is being orderd

## 2020-05-24 NOTE — ED Notes (Signed)
RT notified regarding breathing treatments

## 2020-05-24 NOTE — ED Notes (Signed)
Phlebotomy at bedside to obtain second set of [facility]

## 2020-05-24 NOTE — Progress Notes (Signed)
[facility] following Sepsis Protocol

## 2020-05-25 ENCOUNTER — Inpatient Hospital Stay (HOSPITAL_COMMUNITY): Payer: BLUE CROSS/BLUE SHIELD

## 2020-05-25 ENCOUNTER — Inpatient Hospital Stay: Payer: BLUE CROSS/BLUE SHIELD

## 2020-05-25 ENCOUNTER — Other Ambulatory Visit: Payer: Self-pay

## 2020-05-25 ENCOUNTER — Inpatient Hospital Stay: Payer: BLUE CROSS/BLUE SHIELD | Admitting: Hematology and Oncology

## 2020-05-25 DIAGNOSIS — J90 Pleural effusion, not elsewhere classified: Secondary | ICD-10-CM | POA: Diagnosis not present

## 2020-05-25 DIAGNOSIS — J9622 Acute and chronic respiratory failure with hypercapnia: Secondary | ICD-10-CM

## 2020-05-25 DIAGNOSIS — I361 Nonrheumatic tricuspid (valve) insufficiency: Secondary | ICD-10-CM

## 2020-05-25 DIAGNOSIS — J441 Chronic obstructive pulmonary disease with (acute) exacerbation: Secondary | ICD-10-CM

## 2020-05-25 DIAGNOSIS — R0602 Shortness of breath: Secondary | ICD-10-CM

## 2020-05-25 DIAGNOSIS — J432 Centrilobular emphysema: Secondary | ICD-10-CM | POA: Diagnosis not present

## 2020-05-25 DIAGNOSIS — J9621 Acute and chronic respiratory failure with hypoxia: Secondary | ICD-10-CM | POA: Diagnosis not present

## 2020-05-25 HISTORY — PX: IR THORACENTESIS ASP PLEURAL SPACE W/IMG GUIDE: IMG5380

## 2020-05-25 LAB — COMPREHENSIVE METABOLIC PANEL
ALT: 26 U/L (ref 0–44)
AST: 26 U/L (ref 15–41)
Albumin: 1.8 g/dL — ABNORMAL LOW (ref 3.5–5.0)
Alkaline Phosphatase: 86 U/L (ref 38–126)
Anion gap: 15 (ref 5–15)
BUN: 20 mg/dL (ref 8–23)
CO2: 34 mmol/L — ABNORMAL HIGH (ref 22–32)
Calcium: 8.5 mg/dL — ABNORMAL LOW (ref 8.9–10.3)
Chloride: 90 mmol/L — ABNORMAL LOW (ref 98–111)
Creatinine, Ser: 1.13 mg/dL — ABNORMAL HIGH (ref 0.44–1.00)
GFR, Estimated: 55 mL/min — ABNORMAL LOW (ref >60)
Glucose, Bld: 226 mg/dL — ABNORMAL HIGH (ref 70–99)
Potassium: 3.5 mmol/L (ref 3.5–5.1)
Sodium: 139 mmol/L (ref 135–145)
Total Bilirubin: 0.6 mg/dL (ref 0.3–1.2)
Total Protein: 6.4 g/dL — ABNORMAL LOW (ref 6.5–8.1)

## 2020-05-25 LAB — PROTEIN, PLEURAL OR PERITONEAL FLUID: Total protein, fluid: 3.9 g/dL

## 2020-05-25 LAB — CBC
HCT: 36.3 % (ref 36.0–46.0)
Hemoglobin: 10.4 g/dL — ABNORMAL LOW (ref 12.0–15.0)
MCH: 27.1 pg (ref 26.0–34.0)
MCHC: 28.7 g/dL — ABNORMAL LOW (ref 30.0–36.0)
MCV: 94.5 fL (ref 80.0–100.0)
Platelets: 337 10*3/uL (ref 150–400)
RBC: 3.84 MIL/uL — ABNORMAL LOW (ref 3.87–5.11)
RDW: 15.2 % (ref 11.5–15.5)
WBC: 17 10*3/uL — ABNORMAL HIGH (ref 4.0–10.5)
nRBC: 0.4 % — ABNORMAL HIGH (ref 0.0–0.2)

## 2020-05-25 LAB — BODY FLUID CELL COUNT WITH DIFFERENTIAL
Eos, Fluid: 0 %
Lymphs, Fluid: 22 %
Monocyte-Macrophage-Serous Fluid: 3 % — ABNORMAL LOW (ref 50–90)
Neutrophil Count, Fluid: 75 % — ABNORMAL HIGH (ref 0–25)
Total Nucleated Cell Count, Fluid: 5750 cu mm — ABNORMAL HIGH (ref 0–1000)

## 2020-05-25 LAB — HIV ANTIBODY (ROUTINE TESTING W REFLEX): HIV Screen 4th Generation wRfx: NONREACTIVE

## 2020-05-25 LAB — LACTATE DEHYDROGENASE: LDH: 191 U/L (ref 98–192)

## 2020-05-25 LAB — LACTIC ACID, PLASMA
Lactic Acid, Venous: 1 mmol/L (ref 0.5–1.9)
Lactic Acid, Venous: 3.6 mmol/L (ref 0.5–1.9)
Lactic Acid, Venous: 5.2 mmol/L (ref 0.5–1.9)

## 2020-05-25 LAB — LACTATE DEHYDROGENASE, PLEURAL OR PERITONEAL FLUID: LD, Fluid: 248 U/L — ABNORMAL HIGH (ref 3–23)

## 2020-05-25 LAB — GLUCOSE, PLEURAL OR PERITONEAL FLUID: Glucose, Fluid: 116 mg/dL

## 2020-05-25 LAB — GRAM STAIN

## 2020-05-25 LAB — GLUCOSE, CAPILLARY
Glucose-Capillary: 153 mg/dL — ABNORMAL HIGH (ref 70–99)
Glucose-Capillary: 118 mg/dL — ABNORMAL HIGH (ref 70–99)
Glucose-Capillary: 125 mg/dL — ABNORMAL HIGH (ref 70–99)

## 2020-05-25 LAB — ECHOCARDIOGRAM COMPLETE: S' Lateral: 2.7 cm

## 2020-05-25 MED ORDER — LACTATED RINGERS IV BOLUS
1000.0000 mL | Freq: Once | INTRAVENOUS | Status: AC
  Administered 2020-05-25: 1000 mL via INTRAVENOUS

## 2020-05-25 MED ORDER — UMECLIDINIUM-VILANTEROL 62.5-25 MCG/INH IN AEPB
1.0000 | INHALATION_SPRAY | Freq: Every day | RESPIRATORY_TRACT | Status: DC
  Administered 2020-05-26 – 2020-06-02 (×8): 1 via RESPIRATORY_TRACT
  Filled 2020-05-25: qty 14

## 2020-05-25 MED ORDER — LIDOCAINE HCL 1 % IJ SOLN
INTRAMUSCULAR | Status: AC | PRN
  Administered 2020-05-25: 20 mL via INTRADERMAL

## 2020-05-25 MED ORDER — SODIUM CHLORIDE 0.9% FLUSH
10.0000 mL | Freq: Two times a day (BID) | INTRAVENOUS | Status: DC
  Administered 2020-05-25 – 2020-05-28 (×7): 10 mL
  Administered 2020-05-29: 20 mL
  Administered 2020-05-29: 40 mL
  Administered 2020-05-30: 20 mL
  Administered 2020-05-30 – 2020-06-02 (×6): 10 mL

## 2020-05-25 MED ORDER — METHYLPREDNISOLONE SODIUM SUCC 40 MG IJ SOLR
40.0000 mg | Freq: Three times a day (TID) | INTRAMUSCULAR | Status: DC
  Administered 2020-05-25 – 2020-06-02 (×26): 40 mg via INTRAVENOUS
  Filled 2020-05-25 (×25): qty 1

## 2020-05-25 MED ORDER — CHLORHEXIDINE GLUCONATE CLOTH 2 % EX PADS
6.0000 | MEDICATED_PAD | Freq: Every day | CUTANEOUS | Status: DC
  Administered 2020-05-25 – 2020-06-02 (×9): 6 via TOPICAL

## 2020-05-25 MED ORDER — INSULIN ASPART 100 UNIT/ML ~~LOC~~ SOLN
0.0000 [IU] | SUBCUTANEOUS | Status: DC
  Administered 2020-05-25: 2 [IU] via SUBCUTANEOUS
  Administered 2020-05-26 (×2): 1 [IU] via SUBCUTANEOUS
  Administered 2020-05-26: 2 [IU] via SUBCUTANEOUS
  Administered 2020-05-27: 1 [IU] via SUBCUTANEOUS
  Administered 2020-05-27: 2 [IU] via SUBCUTANEOUS

## 2020-05-25 MED ORDER — BUDESONIDE 0.25 MG/2ML IN SUSP
0.2500 mg | Freq: Two times a day (BID) | RESPIRATORY_TRACT | Status: DC
  Administered 2020-05-25 – 2020-06-02 (×17): 0.25 mg via RESPIRATORY_TRACT
  Filled 2020-05-25 (×17): qty 2

## 2020-05-25 MED ORDER — SODIUM CHLORIDE 0.9% FLUSH
10.0000 mL | INTRAVENOUS | Status: DC | PRN
  Administered 2020-05-28: 10 mL

## 2020-05-25 MED ORDER — LIDOCAINE HCL 1 % IJ SOLN
INTRAMUSCULAR | Status: AC
  Filled 2020-05-25: qty 20

## 2020-05-25 MED ORDER — IPRATROPIUM-ALBUTEROL 0.5-2.5 (3) MG/3ML IN SOLN
3.0000 mL | RESPIRATORY_TRACT | Status: DC | PRN
  Administered 2020-05-27: 3 mL via RESPIRATORY_TRACT
  Filled 2020-05-25: qty 3

## 2020-05-25 NOTE — Progress Notes (Signed)
PROGRESS NOTE    Deborah [name]  TZG:[id number] DOB: 07-15-1956 DOA: 05/24/2020 PCP: Pleas Koch, NP   Chief Complaint  Patient presents with  . Respiratory Distress   Brief Narrative: 64 year old female with significant history of left-sided breast cancer status post lumpectomy and adjuvant chemo, left lower lobe lung cancer, chronic tachycardia in 130s to 140s at some point, CHF with preserved EF, COPD/chronic hypoxic respiratory failure on 4 L home oxygen, hypertension, OSA/pulmonary hypertension presented with worsening hypoxia, shortness of breath, going on for couple of weeks and slowly worsening to the point that she gets hypoxic event after taking just short walks on home oxygen, he stopped taking her Lasix on Friday to prevent her from having to get up and go to the bathroom to prevent falling.  Her emesis saturating 70% at home per liter, was placed on CPAP In the ED tachycardia 130s to 140s tachypneic 20s to 30s, hypoxic, lab work showed leukocytosis, lactic acidosis, chest x-ray small to moderate left pleural effusion with associated lung collapse and diffuse density of the left lobe representing pneumonia, given gentle IV fluid bolus ceftriaxone erythromycin Solu-Medrol nebulizer, placed on BiPAP and admitted.   Subjective:  This morning WEANED OFF BIPAP and placed on salter HFNC 2 L, also wheezing and having rhonchi and cough. But feels better now. coughing with thivck brownish sputum  Lactic acidosis has resolved 1.0 from 5.2 admission. Tachycardic 110-1 20s, tachypneic in the mid 20s, T-max 99.6 wbc at 17k Her husband walked in during our discussion.  Assessment & Plan:  Acute on chronic respiratory failure with hypoxia with left pleural effusion, left-sided pneumonia, copd w/ acute exacerbation, initially on BiPAP, transitioned to 12 L HFNC this morning.  IR consulted for diagnostic/therapeutic thoracentesis once respiratory status is stable.  Follow-up Legionella  antigen/strep antigen/culture.  Continue pulmonary consulted.  Received Solu-Medrol in the ED, cont since still wheezing. Continue on empiric ceftriaxone/azithromycin, bronchodilators.  Severe sepsis POA secondary pneumonia: On empiric ceftriaxone/erythromycin, follow-up culture data.  Hemodynamically stable although tachycardic and tachypneic.  I will order sputum culture  Chronic diastolic CHF? exacerbation, not taking diuretics for 6 weeks  Or so but has not been eating or  Lot and lsot 30 lb as per her in the same time frame.  Echo ordered as patient is on Adriamycin. BNP indeterminate at 198.  Monitor intake output fluid status hold off on IV Lasix, received 1 dose in the ED.  Holding home Cardizem as she has not been taking it.  COPD with acute exacerbation -sees [facility] pulm- will consult.  Continue bronchodilators.  Put back o nsteroid since wheezing, supect copd exacerbation as well.  Lactic acidosis due to respiratory distress/sepsis: It is resolved.  Chronic restrictive lung disease  secondary to super morbid obesity/COPD  Pulmonary HTN/OSA: BiPAP and supplemental oxygen as above  Primary malignant neoplasm of bronchus of left lower lobe, " I have been dealing with it"- she says "it is stage 3 and never will be cured and learning to live with it", sees Dr Julien Nordmann.   Malignant neoplasm of upper-outer quadrant of left breast in female, estrogen receptor negative triple negative status post lumpectomy on adjuvant chemo with Adriamycin and Cytoxan, echocardiogram has been ordered.  Obese- check BMI  GOC: we discussed goals of care CODE STATUS she would like to stay full code.  Monitor closely prognosis remains to be seen.  Decompensation.  Nutrition: Diet Order            Diet NPO time  specified  Diet effective now                  There is no height or weight on file to calculate BMI.   DVT prophylaxis: enoxaparin (LOVENOX) injection 40 mg Start: 05/24/20 2200 Code Status:    Code Status: Full Code  Family Communication: plan of care discussed with patient at bedside.  Status is: Inpatient  Remains inpatient appropriate because:IV treatments appropriate due to intensity of illness or inability to take PO and Inpatient level of care appropriate due to severity of illness   Dispo: The patient is from: Home w/ husband, usually ambulatory              Anticipated d/c is to: Home              Anticipated d/c date is: > 3 days              Patient currently is medically stable to d/c.   Consultants:see note  Procedures:see note  Culture/Microbiology    Component Value Date/Time   SDES BLOOD RIGHT HAND 05/24/2020 2106   SPECREQUEST  05/24/2020 2106    BOTTLES DRAWN AEROBIC AND ANAEROBIC Blood Culture results may not be optimal due to an inadequate volume of blood received in culture bottles   CULT  05/24/2020 2106    NO GROWTH < 12 HOURS Performed at [facility] [address] [place], [place] [address]    REPTSTATUS PENDING 05/24/2020 2106    Other culture-see note  Medications: Scheduled Meds: . enoxaparin (LOVENOX) injection  40 mg Subcutaneous Q24H  . ipratropium  0.5 mg Nebulization Once  . ipratropium-albuterol  3 mL Nebulization Q6H  . sodium chloride flush  3 mL Intravenous Q12H   Continuous Infusions: . albuterol Stopped (05/24/20 2000)  . azithromycin Stopped (05/24/20 2335)  . cefTRIAXone (ROCEPHIN)  IV      Antimicrobials: Anti-infectives (From admission, onward)   Start     Dose/Rate Route Frequency Ordered Stop   05/25/20 2100  cefTRIAXone (ROCEPHIN) 2 g in sodium chloride 0.9 % 100 mL IVPB        2 g 200 mL/hr over 30 Minutes Intravenous Every 24 hours 05/24/20 2126 05/29/20 2059   05/24/20 2200  azithromycin (ZITHROMAX) 500 mg in sodium chloride 0.9 % 250 mL IVPB        500 mg 250 mL/hr over 60 Minutes Intravenous Every 24 hours 05/24/20 2126 05/29/20 2159   05/24/20 2015  cefTRIAXone (ROCEPHIN) 2 g in sodium  chloride 0.9 % 100 mL IVPB  Status:  Discontinued        2 g 200 mL/hr over 30 Minutes Intravenous Every 24 hours 05/24/20 2004 05/24/20 2128   05/24/20 2015  azithromycin (ZITHROMAX) 500 mg in sodium chloride 0.9 % 250 mL IVPB  Status:  Discontinued        500 mg 250 mL/hr over 60 Minutes Intravenous Every 24 hours 05/24/20 2004 05/24/20 2128     Objective: Vitals: Today's Vitals   05/25/20 0747 05/25/20 0757 05/25/20 0800 05/25/20 0815  BP: (!) 105/54 (!) 105/54 103/79 105/61  Pulse: (!) 121  (!) 123 (!) 122  Resp: (!) 26  (!) 25 (!) 25  Temp:    98.8 F (37.1 C)  TempSrc:    Oral  SpO2: 92% 92% (!) 89% 93%  PainSc:        Intake/Output Summary (Last 24 hours) at 05/25/2020 0840 Last data filed at 05/24/2020 2334 Gross per  24 hour  Intake 500 ml  Output --  Net 500 ml   There were no vitals filed for this visit. Weight change:   Intake/Output from previous day: 11/08 0701 - 11/09 0700 In: 500 [I.V.:500] Out: -  Intake/Output this shift: No intake/output data recorded.  Examination: General exam: AAOx3 ,NAD, weak appearing. HEENT:Oral mucosa moist, Ear/Nose WNL grossly,dentition normal. Respiratory system: bilaterally air entry +, wheezing,no use of accessory muscle, non tender. Cardiovascular system: S1 & S2 +, regular, No JVD. Gastrointestinal system: Abdomen soft, obese, NT,ND, BS+. Nervous System:Alert, awake, moving extremities and grossly nonfocal Extremities: Non pitting edema, distal peripheral pulses palpable.  Skin: No rashes,no icterus. MSK: Normal muscle bulk,tone, power  Data Reviewed: I have personally reviewed following labs and imaging studies CBC: Recent Labs  Lab 05/18/20 1038 05/24/20 1847 05/24/20 2120 05/25/20 0254  WBC 4.9 17.1*  --  17.0*  NEUTROABS 3.9 16.4*  --   --   HGB 11.6* 11.6* 12.6 10.4*  HCT 39.3 40.3 37.0 36.3  MCV 94.0 93.7  --  94.5  PLT 183 345  --  924   Basic Metabolic Panel: Recent Labs  Lab 05/18/20 1038  05/24/20 1847 05/24/20 2120 05/25/20 0254  NA 139 137 139 139  K 4.6 4.0 3.3* 3.5  CL 96* 89*  --  90*  CO2 38* 34*  --  34*  GLUCOSE 110* 149*  --  226*  BUN 12 16  --  20  CREATININE 0.61 0.90  --  1.13*  CALCIUM 9.0 8.7*  --  8.5*   GFR: Estimated Creatinine Clearance: 60.5 mL/min (A) (by C-G formula based on SCr of 1.13 mg/dL (H)). Liver Function Tests: Recent Labs  Lab 05/18/20 1038 05/25/20 0254  AST 33 26  ALT 51* 26  ALKPHOS 124 86  BILITOT 0.6 0.6  PROT 6.9 6.4*  ALBUMIN 3.1* 1.8*   No results for input(s): LIPASE, AMYLASE in the last 168 hours. No results for input(s): AMMONIA in the last 168 hours. Coagulation Profile: Recent Labs  Lab 05/24/20 2004  INR 1.3*   Cardiac Enzymes: No results for input(s): CKTOTAL, CKMB, CKMBINDEX, TROPONINI in the last 168 hours. BNP (last 3 results) No results for input(s): PROBNP in the last 8760 hours. HbA1C: No results for input(s): HGBA1C in the last 72 hours. CBG: No results for input(s): GLUCAP in the last 168 hours. Lipid Profile: No results for input(s): CHOL, HDL, LDLCALC, TRIG, CHOLHDL, LDLDIRECT in the last 72 hours. Thyroid Function Tests: No results for input(s): TSH, T4TOTAL, FREET4, T3FREE, THYROIDAB in the last 72 hours. Anemia Panel: No results for input(s): VITAMINB12, FOLATE, FERRITIN, TIBC, IRON, RETICCTPCT in the last 72 hours. Sepsis Labs: Recent Labs  Lab 05/24/20 2004 05/24/20 2335 05/25/20 0254 05/25/20 0520  LATICACIDVEN 2.3* 5.2* 3.6* 1.0    Recent Results (from the past 240 hour(s))  Respiratory Panel by RT PCR (Flu A&B, Covid) - Nasopharyngeal Swab     Status: None   Collection Time: 05/24/20  6:47 PM   Specimen: Nasopharyngeal Swab  Result Value Ref Range Status   SARS Coronavirus 2 by RT PCR NEGATIVE NEGATIVE Final    Comment: (NOTE) SARS-CoV-2 target nucleic acids are NOT DETECTED.  The SARS-CoV-2 RNA is generally detectable in upper respiratoy specimens during the acute  phase of infection. The lowest concentration of SARS-CoV-2 viral copies this assay can detect is 131 copies/mL. A negative result does not preclude SARS-Cov-2 infection and should not be used as the sole basis for  treatment or other patient management decisions. A negative result may occur with  improper specimen collection/handling, submission of specimen other than nasopharyngeal swab, presence of viral mutation(s) within the areas targeted by this assay, and inadequate number of viral copies (<131 copies/mL). A negative result must be combined with clinical observations, patient history, and epidemiological information. The expected result is Negative.  Fact Sheet for Patients:  PinkCheek.be  Fact Sheet for Healthcare Providers:  GravelBags.it  This test is no t yet approved or cleared by the [place] FDA and  has been authorized for detection and/or diagnosis of SARS-CoV-2 by FDA under an Emergency Use Authorization (EUA). This EUA will remain  in effect (meaning this test can be used) for the duration of the COVID-19 declaration under Section 564(b)(1) of the Act, 21 U.S.C. section 360bbb-3(b)(1), unless the authorization is terminated or revoked sooner.     Influenza A by PCR NEGATIVE NEGATIVE Final   Influenza B by PCR NEGATIVE NEGATIVE Final    Comment: (NOTE) The Xpert Xpress SARS-CoV-2/FLU/RSV assay is intended as an aid in  the diagnosis of influenza from Nasopharyngeal swab specimens and  should not be used as a sole basis for treatment. Nasal washings and  aspirates are unacceptable for Xpert Xpress SARS-CoV-2/FLU/RSV  testing.  Fact Sheet for Patients: PinkCheek.be  Fact Sheet for Healthcare Providers: GravelBags.it  This test is not yet approved or cleared by the [place] FDA and  has been authorized for detection and/or diagnosis of  SARS-CoV-2 by  FDA under an Emergency Use Authorization (EUA). This EUA will remain  in effect (meaning this test can be used) for the duration of the  Covid-19 declaration under Section 564(b)(1) of the Act, 21  U.S.C. section 360bbb-3(b)(1), unless the authorization is  terminated or revoked. Performed at [facility], [place] [address], [place], [place] [address]   Blood Culture (routine x 2)     Status: None (Preliminary result)   Collection Time: 05/24/20  8:40 PM   Specimen: BLOOD RIGHT ARM  Result Value Ref Range Status   Specimen Description BLOOD RIGHT ARM  Final   Special Requests   Final    BOTTLES DRAWN AEROBIC AND ANAEROBIC Blood Culture adequate volume   Culture   Final    NO GROWTH < 12 HOURS Performed at [facility], [place] [address], [place], [place] [address]    Report Status PENDING  Incomplete  Blood Culture (routine x 2)     Status: None (Preliminary result)   Collection Time: 05/24/20  9:06 PM   Specimen: BLOOD RIGHT HAND  Result Value Ref Range Status   Specimen Description BLOOD RIGHT HAND  Final   Special Requests   Final    BOTTLES DRAWN AEROBIC AND ANAEROBIC Blood Culture results may not be optimal due to an inadequate volume of blood received in culture bottles   Culture   Final    NO GROWTH < 12 HOURS Performed at [facility], [place] [address] [place], [place] [address]    Report Status PENDING  Incomplete     Radiology Studies: DG Chest Port 1 View  Result Date: 05/24/2020 CLINICAL DATA:  64 year old female with shortness of breath. EXAM: PORTABLE CHEST 1 VIEW COMPARISON:  Chest radiograph dated 04/19/2018 and CT dated 01/26/2020. FINDINGS: Right-sided Port-A-Cath with tip close to the cavoatrial junction. There is small to moderate left pleural effusion with collapse of the majority of the left lung. There is diffuse interstitial and streaky  density in the aerated portion of the left lung which may represent atelectasis, asymmetric  edema, or pneumonia. Clinical correlation is recommended. There is background of emphysema. Faint right lung base reticulonodular densities, likely atelectasis. There is no pneumothorax. Stable cardiomediastinal silhouette. Atherosclerotic calcification of the aorta. No acute osseous pathology. IMPRESSION: 1. Small to moderate left pleural effusion with collapse of the majority of the left lung. 2. Diffuse interstitial and streaky density in the aerated portion of the left lung may represent atelectasis, asymmetric edema, or pneumonia. Electronically Signed   By: Anner Crete M.D.   On: 05/24/2020 17:59     LOS: 1 day   Antonieta Pert, MD [facility]  05/25/2020, 8:40 AM

## 2020-05-25 NOTE — Consult Note (Signed)
NAME:  Deborah [name], MRN:  [id number], DOB:  24-Jul-1955, LOS: 1 ADMISSION DATE:  05/24/2020, CONSULTATION DATE:  05/25/2020 REFERRING MD:  Dr. Lupita Leash, CHIEF COMPLAINT:  SOB/ hypoxic  Brief History   69 yoF currently under treatment for breast cancer and hx of lung cancer, emphysema and chronic hypoxic respiratory failure presenting with progressive SOB and hypoxia found to have moderate left pleural effusion being treated for CAP and acute COPD exacerbation.  PCCM called for further pulmonary recommendations.   History of present illness   64 year old female with history of chronic hypoxic respiratory failure on 4 [place], centrilobular emphysema/ COPD, OSA not compliant with CPAP, former smoker, Stage III squamous cell carcinoma of left lung (2019) s/p chemo, triple negative breast cancer (dx 02/2020), HFpEF, HTN, and pulmonary hypertension.  She is followed in our office, last seen 04/14/2020 by Dr. Silas Flood, doing well on Breztri and prn duonebs nebs.  However, she states she has since stopped her Breztri, without a specific reason but seemed more overwhelmed with all her medical issues and recent chemo.      She presented 11/8 with progressively worsening hypoxia and shortness of breath to the point of being short of breath at rest and having desaturations at home despite her 4L [place].  Reports getting down to 44% at home with exertion.  Additionally she stopped taking her lasix last week given concern of falling/ syncope with going to bathroom frequently.  She reports 27 lb weight loss over the last 6 weeks with decreased PO intake.  Denies any recent fever, chest pain, or lower extremity swelling.  Does have a productive cough with brownish- yellow sputum.    Of noted, she is followed by Dr. [name] with oncology.  Recent 02/2020 diagnoses of triple negative breast cancer s/p left lumpectomy currently on adriamycin and cytoxan.  On chart review, she did not tolerate her first round of chemo given extreme  fatigue, SOB, increased heart rate, and generalized failure to thrive.  Her dose was significantly lowered and with oncology not recommending any further chemotherapy for her.   Found by EMS with saturations into the 70's on her 4L [place].  ER course noted for tachycardia into the 130-140s (noted to be tachycardic chronically since starting chemo), tachypneic, and normotensive but requiring BiPAP to maintain her saturations.  Labs significant for WBC 17, Hgb 11.6, bicarb 34, BNP 198, lactic 5.2-> 3.6-> 1, trop hs 20, glucose 226, UA neg, and CXR showing small to moderate left pleural effusion with collapse of majority of left lung with interstitial and streaky density in the aerated portion of the left lung representing atelectasis verus edema vs developing pneumonia. IR consulted for left thoracentesis.  Cultures sent and started on empiric azithromycin and ceftriaxone, nebs, and given lasix.  ABG noted 7.31/ 76/ 68.   Required BiPAP overnight but now on HFNC 12L.  PCCM consulted for further pulmonary recommendations.    Past Medical History  Chronic hypoxic respiratory failure on 4 [place], centrilobular emphysema/ COPD, OSA not compliant with CPAP, former smoker, Stage III squamous cell carcinoma of left lung (2019) s/p chemo, breast cancer (dx 02/2020), HFpEF, HTN, pulmonary hypertension  Significant Hospital Events   11/8 admitted [place]  Consults:  IR Pulmonary   Procedures:   Significant Diagnostic Tests:   Micro Data:  11/8 SARS2/ flu >> neg 11/8 MRSA >> 11/8 BCx 2 >>  11/9 strep urinary ag >> 11/9 legionalla >>  Antimicrobials:  11/8 ceftriaxone >>  11/8 azithromycin >>  Interim history/subjective:   Objective   Blood pressure (!) 108/57, pulse (!) 120, temperature 98.9 F (37.2 C), temperature source Oral, resp. rate (!) 28, SpO2 92 %.    Vent Mode: BIPAP;PCV FiO2 (%):  [50 %-65 %] 60 % Set Rate:  [15 bmp] 15 bmp PEEP:  [5 cmH20-6 cmH20] 6 cmH20   Intake/Output Summary (Last 24  hours) at 05/25/2020 1042 Last data filed at 05/24/2020 2334 Gross per 24 hour  Intake 500 ml  Output --  Net 500 ml   There were no vitals filed for this visit.  Examination: General:  Obese, chronically ill appearing female in mild distress Neuro: Alert/ oriented x 4, MAE CV: ST, rr, no murmur PULM:  tachypneic with mild increased work of breathing, diffuse inspiratory/ expiratory wheezing, scattered rhonchi.  Thick brownish/ yellow sputum noted in bedside pan  GI: soft, bs+  Extremities: warm/dry, no LE edema  Skin: no rashes  Resolved Hospital Problem list    Assessment & Plan:   Acute on chronic hypoxic respiratory failure, baseline 4L [place] Acute exacerbation of centrilobular emphysema/ COPD OSA, untreated, possible component of OHS Left pleural effusion  CAP  P:  Continue supplemental O2 for goal sat > 88%  BIPAP as needed, would benefit from qHS and while napping Urine legionella and strep pending  Agree with continuing abx, azithro and ceftriaxone  Anoro and budesonide added Continue Duonebs, increased to prn q 4 and albuterol q 2 prn  Continue solumedrol 40mg  q 8hrs IR consulted for left thoracentesis - agree with cytology, culture, LDH, protein, adding on cell count Send sputum for expectorated culture  Follow cultures/ trend WBC No further diuresis at this time, pending TTE, doubt HF exacerbation Recommend palliative care consult   Hyperglycemia, exacerbated by steroids P: Adding CBG q 4 with SSI Check HgbA1c   PCCM will continue to follow along.  Remainder per primary team    Best practice:  Diet: per primary  Pain/Anxiety/Delirium protocol (if indicated): n/a VAP protocol (if indicated): n/a DVT prophylaxis: lovenox GI prophylaxis: n/a Glucose control: adding SSI Mobility: BR Code Status: Dr. Carlis Abbott did discuss with patient at bedside.  Initially she said no to intubation if she were to decompensate but yes to CPR, but after discussing what transition  to comfort care would look like if she were to decompensate, patient seems hesitate and needed to discuss with her husband.  Remains full code.  Recommend palliative care consultation Family Communication: Patient updated on plan of care Disposition: PCU  Labs   CBC: Recent Labs  Lab 05/24/20 1847 05/24/20 2120 05/25/20 0254  WBC 17.1*  --  17.0*  NEUTROABS 16.4*  --   --   HGB 11.6* 12.6 10.4*  HCT 40.3 37.0 36.3  MCV 93.7  --  94.5  PLT 345  --  119    Basic Metabolic Panel: Recent Labs  Lab 05/24/20 1847 05/24/20 2120 05/25/20 0254  NA 137 139 139  K 4.0 3.3* 3.5  CL 89*  --  90*  CO2 34*  --  34*  GLUCOSE 149*  --  226*  BUN 16  --  20  CREATININE 0.90  --  1.13*  CALCIUM 8.7*  --  8.5*   GFR: Estimated Creatinine Clearance: 60.5 mL/min (A) (by C-G formula based on SCr of 1.13 mg/dL (H)). Recent Labs  Lab 05/24/20 1847 05/24/20 2004 05/24/20 2335 05/25/20 0254 05/25/20 0520  WBC 17.1*  --   --  17.0*  --  LATICACIDVEN  --  2.3* 5.2* 3.6* 1.0    Liver Function Tests: Recent Labs  Lab 05/25/20 0254  AST 26  ALT 26  ALKPHOS 86  BILITOT 0.6  PROT 6.4*  ALBUMIN 1.8*   No results for input(s): LIPASE, AMYLASE in the last 168 hours. No results for input(s): AMMONIA in the last 168 hours.  ABG    Component Value Date/Time   PHART 7.310 (L) 05/24/2020 2120   PCO2ART 76.8 (HH) 05/24/2020 2120   PO2ART 68 (L) 05/24/2020 2120   HCO3 38.6 (H) 05/24/2020 2120   TCO2 41 (H) 05/24/2020 2120   O2SAT 90.0 05/24/2020 2120     Coagulation Profile: Recent Labs  Lab 05/24/20 2004  INR 1.3*    Cardiac Enzymes: No results for input(s): CKTOTAL, CKMB, CKMBINDEX, TROPONINI in the last 168 hours.  HbA1C: Hemoglobin A1C  Date/Time Value Ref Range Status  11/12/2019 03:37 PM 5.5 4.0 - 5.6 % Final   Hgb A1c MFr Bld  Date/Time Value Ref Range Status  07/25/2014 06:00 PM 6.2 (H) <5.7 % Final    Comment:    (NOTE)                                                                        According to the ADA Clinical Practice Recommendations for 2011, when HbA1c is used as a screening test:  >=6.5%   Diagnostic of Diabetes Mellitus           (if abnormal result is confirmed) 5.7-6.4%   Increased risk of developing Diabetes Mellitus References:Diagnosis and Classification of Diabetes Mellitus,Diabetes ZDGU,4403,47(QQVZD 1):S62-S69 and Standards of Medical Care in         Diabetes - 2011,Diabetes GLOV,5643,32 (Suppl 1):S11-S61.     CBG: No results for input(s): GLUCAP in the last 168 hours.  Review of Systems:   Review of Systems  Constitutional: Positive for malaise/fatigue and weight loss. Negative for chills and fever.  Respiratory: Positive for cough, sputum production, shortness of breath and wheezing. Negative for hemoptysis.   Cardiovascular: Negative for chest pain, palpitations and leg swelling.  Gastrointestinal: Negative for nausea and vomiting.  Neurological: Positive for weakness. Negative for focal weakness.   As per HPI, otherwise negative  Past Medical History  She,  has a past medical history of Anemia, Asthma, Breast cancer ([place]), CHF (congestive heart failure) ([place]), COPD, mild ([place]), Depression, Diverticulitis, Family history of anesthesia complication, GI bleeding, Heart failure ([place]), Histoplasmosis, Hyperkalemia, Hypertension, Lung cancer ([place]) (05/02/2018), Obesity (BMI 30-39.9), Pneumonia, PONV (postoperative nausea and vomiting), Restrictive lung disease, Shortness of breath, Sleep apnea, Tobacco abuse, and Umbilical hernia.   Surgical History    Past Surgical History:  Procedure Laterality Date  . APPLICATION OF WOUND VAC  03/19/2013   Procedure: APPLICATION OF WOUND VAC;  Surgeon: Madilyn Hook, DO;  Location: [facility];  Service: General;;  . BREAST LUMPECTOMY WITH RADIOACTIVE SEED AND SENTINEL LYMPH NODE BIOPSY Left 04/08/2020   Procedure: LEFT BREAST LUMPECTOMY WITH RADIOACTIVE SEED AND LEFT AXILLARY SENTINEL  LYMPH NODE BIOPSY;  Surgeon: Alphonsa Overall, MD;  Location: [facility];  Service: General;  Laterality: Left;  PEC BLOCK, NEEDS ANESTHESIA CONSULT  . CESAREAN SECTION  04/12/85  . COLONOSCOPY WITH PROPOFOL N/A 06/13/2016  Procedure: COLONOSCOPY WITH PROPOFOL;  Surgeon: Jerene Bears, MD;  Location: [facility];  Service: Gastroenterology;  Laterality: N/A;  . ERCP N/A 02/15/2017   Procedure: ENDOSCOPIC RETROGRADE CHOLANGIOPANCREATOGRAPHY (ERCP);  Surgeon: [name], MD;  Location: [facility];  Service: Endoscopy;  Laterality: N/A;  . INSERTION OF MESH N/A 03/19/2013   Procedure: INSERTION OF MESH;  Surgeon: Madilyn Hook, DO;  Location: [facility];  Service: General;  Laterality: N/A;  . IR IMAGING GUIDED PORT INSERTION  03/26/2020  . VENTRAL HERNIA REPAIR N/A 03/19/2013   Procedure:  OPEN VENTRAL HERNIA REPAIR WITH MESH AND APPLICATION OF WOUND VAC;  Surgeon: Madilyn Hook, DO;  Location: [facility];  Service: General;  Laterality: N/A;  . VIDEO BRONCHOSCOPY Bilateral 04/19/2018   Procedure: VIDEO BRONCHOSCOPY WITH FLUORO;  Surgeon: Rigoberto Noel, MD;  Location[facility];  Service: Cardiopulmonary;  Laterality: Bilateral;     Social History   reports that she quit smoking about 7 years ago. Her smoking use included cigarettes. She has a 129.00 pack-year smoking history. She has never used smokeless tobacco. She reports that she does not drink alcohol and does not use drugs.   Family History   Her family history includes Bipolar disorder in her father and sister; Congestive Heart Failure in her father; Emphysema in her mother; Heart attack in her maternal uncle, maternal uncle, and mother; Lymphoma in her maternal uncle; Melanoma in her sister; Other in her sister; Pancreatic cancer in her maternal aunt; Schizophrenia in her sister. There is no history of Colon cancer or Breast cancer.   Allergies No Known Allergies   Home Medications  Prior to Admission medications   Medication Sig Start Date End  Date Taking? Authorizing Provider  acetaminophen (TYLENOL) 325 MG tablet Take 650 mg by mouth every 6 (six) hours as needed for headache (pain).   Yes [provider]  albuterol (VENTOLIN HFA) 108 (90 Base) MCG/ACT inhaler Inhale 2 puffs into the lungs every 6 (six) hours as needed for wheezing or shortness of breath. 01/16/20  Yes Brand Males, MD  aspirin 81 MG chewable tablet Chew 1 tablet (81 mg total) by mouth daily. 10/05/14  Yes [name], MD  Aspirin-Caffeine [facility] FAST PAIN RELIEF ARTHRITIS PO) Take 1 packet by mouth daily as needed (back pain).   Yes [provider]  Budeson-Glycopyrrol-Formoterol (BREZTRI AEROSPHERE) 160-9-4.8 MCG/ACT AERO Inhale 2 puffs into the lungs daily. Patient taking differently: Inhale 2 puffs into the lungs daily as needed (shortness of breath/wheezing).  01/14/20  Yes Brand Males, MD  diltiazem (CARDIZEM CD) 240 MG 24 hr capsule TAKE 1 CAPSULE BY MOUTH EVERY DAY Patient taking differently: Take 240 mg by mouth daily.  05/21/20  Yes [name], MD  furosemide (LASIX) 20 MG tablet TAKE 2 TABLETS BY MOUTH EVERY DAY Patient taking differently: Take 40 mg by mouth daily.  05/03/20  Yes Pleas Koch, NP  ipratropium-albuterol (DUONEB) 0.5-2.5 (3) MG/3ML SOLN Take 3 mLs by nebulization every 3 (three) hours as needed. Patient taking differently: Take 3 mLs by nebulization every 3 (three) hours as needed (shortness of breath/wheezing).  04/14/20  Yes Hunsucker, Bonna Gains, MD  lidocaine-prilocaine (EMLA) cream Apply 1 application topically as needed (prior to port access).   Yes [provider]  montelukast (SINGULAIR) 10 MG tablet Take 1 tablet (10 mg total) by mouth at bedtime. 12/31/19  Yes Pleas Koch, NP  OXYGEN Inhale 4 L/min into the lungs continuous.    Yes [provider]  potassium chloride SA (KLOR-CON M20) 20 MEQ tablet TAKE 1/2 TABLET BY MOUTH EVERY DAY Patient taking differently: Take 10 mEq by  mouth daily.  05/18/20  Yes Pleas Koch, NP  traMADol (ULTRAM) 50 MG tablet Take 1 tablet (50 mg total) by mouth every 6 (six) hours as needed for moderate pain or severe pain. 04/08/20  Yes Alphonsa Overall, MD  Spacer/Aero-Holding Chambers (AEROCHAMBER MV) inhaler Use as instructed 01/01/17   Parrett, Fonnie Mu, NP  prochlorperazine (COMPAZINE) 10 MG tablet Take 1 tablet (10 mg total) by mouth every 6 (six) hours as needed (Nausea or vomiting). 04/19/20 05/18/20  Nicholas Lose, MD       Kennieth Rad, ACNP [facility] 05/25/2020, 10:42 AM  See Shea Evans for personal pager PCCM on call pager [phone]

## 2020-05-25 NOTE — ED Notes (Signed)
Pt transported accompanied by this RN. Pt was on 15L [place] as recommended by RT. Pt's oygenation remained above 95%.

## 2020-05-25 NOTE — Progress Notes (Signed)
Pt O2 dropped to 75% on 12LPM, increased O2 to 13 pt only increased to 85%. Increased O2 to 15LPM O2 between 89-91%

## 2020-05-25 NOTE — ED Notes (Signed)
Pt to IR accompanied by this RN

## 2020-05-25 NOTE — Plan of Care (Signed)
  Problem: Education: Goal: Knowledge of General Education information will improve Description Including pain rating scale, medication(s)/side effects and non-pharmacologic comfort measures Outcome: Progressing   

## 2020-05-25 NOTE — Procedures (Signed)
Ultrasound-guided diagnostic and therapeutic left sided thoracentesis performed yielding 520 mililiters of straw colored fluid. No immediate complications.   Diagnostic fluid was sent to the lab for further analysis. Follow-up chest x-ray pending. EBL is <2 ml. Marland Kitchen

## 2020-05-25 NOTE — Progress Notes (Signed)
[facility] continue to follow sepsis protocol, please see Dr. Tally Joe notes at 21:47 about fluids, holding off on significant fluid bolus as B/P is near base and has history of HF

## 2020-05-25 NOTE — Progress Notes (Signed)
Per Ed RN, pt does not need to transport back to ED on monitor.

## 2020-05-25 NOTE — Progress Notes (Signed)
Dr. Trilby Drummer aware of elevated lactic order

## 2020-05-25 NOTE — Progress Notes (Signed)
Patient tritrated off Bipap and placed on [place] HFNC 12L with humidity. Patient has I/E wheezes and rhonchi with a productive cough even after a Douneb treatment.  Patient states she feels much better and is tolerating the HFNC at this time. RN notified.

## 2020-05-25 NOTE — Progress Notes (Signed)
  Echocardiogram 2D Echocardiogram has been performed.  Jennette Dubin 05/25/2020, 3:19 PM

## 2020-05-26 ENCOUNTER — Inpatient Hospital Stay (HOSPITAL_COMMUNITY): Payer: BLUE CROSS/BLUE SHIELD

## 2020-05-26 DIAGNOSIS — J9621 Acute and chronic respiratory failure with hypoxia: Secondary | ICD-10-CM | POA: Diagnosis not present

## 2020-05-26 LAB — BASIC METABOLIC PANEL
Anion gap: 8 (ref 5–15)
BUN: 27 mg/dL — ABNORMAL HIGH (ref 8–23)
CO2: 39 mmol/L — ABNORMAL HIGH (ref 22–32)
Calcium: 8.5 mg/dL — ABNORMAL LOW (ref 8.9–10.3)
Chloride: 95 mmol/L — ABNORMAL LOW (ref 98–111)
Creatinine, Ser: 0.69 mg/dL (ref 0.44–1.00)
GFR, Estimated: >60 mL/min (ref >60)
Glucose, Bld: 133 mg/dL — ABNORMAL HIGH (ref 70–99)
Potassium: 4 mmol/L (ref 3.5–5.1)
Sodium: 142 mmol/L (ref 135–145)

## 2020-05-26 LAB — CBC
HCT: 32.7 % — ABNORMAL LOW (ref 36.0–46.0)
Hemoglobin: 9.4 g/dL — ABNORMAL LOW (ref 12.0–15.0)
MCH: 27.2 pg (ref 26.0–34.0)
MCHC: 28.7 g/dL — ABNORMAL LOW (ref 30.0–36.0)
MCV: 94.8 fL (ref 80.0–100.0)
Platelets: 352 10*3/uL (ref 150–400)
RBC: 3.45 MIL/uL — ABNORMAL LOW (ref 3.87–5.11)
RDW: 15.1 % (ref 11.5–15.5)
WBC: 16.8 10*3/uL — ABNORMAL HIGH (ref 4.0–10.5)
nRBC: 0.2 % (ref 0.0–0.2)

## 2020-05-26 LAB — GLUCOSE, CAPILLARY
Glucose-Capillary: 122 mg/dL — ABNORMAL HIGH (ref 70–99)
Glucose-Capillary: 124 mg/dL — ABNORMAL HIGH (ref 70–99)
Glucose-Capillary: 125 mg/dL — ABNORMAL HIGH (ref 70–99)
Glucose-Capillary: 136 mg/dL — ABNORMAL HIGH (ref 70–99)
Glucose-Capillary: 154 mg/dL — ABNORMAL HIGH (ref 70–99)
Glucose-Capillary: 189 mg/dL — ABNORMAL HIGH (ref 70–99)

## 2020-05-26 LAB — URINE CULTURE

## 2020-05-26 LAB — HEMOGLOBIN A1C
Hgb A1c MFr Bld: 6 % — ABNORMAL HIGH (ref 4.8–5.6)
Mean Plasma Glucose: 125.5 mg/dL

## 2020-05-26 LAB — BRAIN NATRIURETIC PEPTIDE: B Natriuretic Peptide: 280 pg/mL — ABNORMAL HIGH (ref 0.0–100.0)

## 2020-05-26 MED ORDER — FUROSEMIDE 40 MG PO TABS
40.0000 mg | ORAL_TABLET | Freq: Every day | ORAL | Status: DC
  Administered 2020-05-26 – 2020-06-02 (×8): 40 mg via ORAL
  Filled 2020-05-26 (×8): qty 1

## 2020-05-26 MED ORDER — MONTELUKAST SODIUM 10 MG PO TABS
10.0000 mg | ORAL_TABLET | Freq: Every day | ORAL | Status: DC
  Administered 2020-05-26 – 2020-06-01 (×7): 10 mg via ORAL
  Filled 2020-05-26 (×7): qty 1

## 2020-05-26 MED ORDER — ORAL CARE MOUTH RINSE
15.0000 mL | Freq: Two times a day (BID) | OROMUCOSAL | Status: DC
  Administered 2020-05-26 – 2020-06-02 (×12): 15 mL via OROMUCOSAL

## 2020-05-26 MED ORDER — TRAMADOL HCL 50 MG PO TABS: 50.0000 mg | ORAL_TABLET | Freq: Four times a day (QID) | ORAL | Status: DC | PRN

## 2020-05-26 MED ORDER — ASPIRIN 81 MG PO CHEW
81.0000 mg | CHEWABLE_TABLET | Freq: Every day | ORAL | Status: DC
  Administered 2020-05-26 – 2020-06-02 (×8): 81 mg via ORAL
  Filled 2020-05-26 (×8): qty 1

## 2020-05-26 MED ORDER — DOXYCYCLINE HYCLATE 100 MG PO TABS
100.0000 mg | ORAL_TABLET | Freq: Two times a day (BID) | ORAL | Status: AC
  Administered 2020-05-26 – 2020-05-28 (×5): 100 mg via ORAL
  Filled 2020-05-26 (×5): qty 1

## 2020-05-26 NOTE — Plan of Care (Signed)
Weaning O2 this shift. Denies pain or SOB. Husband remains at bedside. Safety precautions maintained,

## 2020-05-26 NOTE — Progress Notes (Signed)
PROGRESS NOTE    Deborah [name]  GDJ:[id number] DOB: April 25, 1956 DOA: 05/24/2020 PCP: Pleas Koch, NP   Chief Complaint  Patient presents with  . Respiratory Distress   Brief Narrative: 64 year old female with significant history of left-sided breast cancer status post lumpectomy and adjuvant chemo, left lower lobe lung cancer, chronic tachycardia in 130s to 140s at some point, CHF with preserved EF, COPD/chronic hypoxic respiratory failure on 4 L home oxygen, hypertension, OSA/pulmonary hypertension presented with worsening hypoxia, shortness of breath, going on for couple of weeks and slowly worsening to the point that she gets hypoxic event after taking just short walks on home oxygen, he stopped taking her Lasix on Friday to prevent her from having to get up and go to the bathroom to prevent falling.  Her emesis saturating 70% at home per liter, was placed on CPAP In the ED tachycardia 130s to 140s tachypneic 20s to 30s, hypoxic, lab work showed leukocytosis, lactic acidosis, chest x-ray small to moderate left pleural effusion with associated lung collapse and diffuse density of the left lobe representing pneumonia, given gentle IV fluid bolus ceftriaxone erythromycin Solu-Medrol nebulizer, placed on BiPAP and admitted.  11.9 am weaned off from BIPAP to [place] HFNC 12l- s/p left s/p 520 mL of straw-colored fluid was removed, exudative. continued on Bedtime bipap.  Subjective:  On BiPAP currently on 15 L nasal cannula weaned down to 12 L saturating well 99-100%.  She feels much improved, no more wheezing this morning. T-max 100.1, WBC downtrending 16k Tachycardia and tachypnea better  Assessment & Plan:  Acute on chronic respiratory failure with hypoxia, on 4 L [place] at baseline, 2/2 left exudative pleural effusion, left-sided pneumonia, copd w/ acute exacerbation, initially on BiPAP in ED: Continuing nasal cannula currently weaned from 15 to 12 L, bedtime BiPAP per PCCM.  Wheezing is  significantly improved w./ steroid. Status post thoracentesis 11/9-pleural fluid Gram stain negative, no growth so far. Keep on IV antibiotics ceftriaxone/azithromycin, iv Solu-Medrol, nebs.  Resume home Lasix 40 mg. PT OT evaluation as tolerated.  Follow-up culture data urinary antigen.  Continue plan per PCCM and appreciate input  Severe sepsis POA/community-acquired pneumonia: Continue ceftriaxone/AZITHRO-blood culture no growth so far.  Pleural fluid culture no growth.  Tachycardia tachypnea improved WBC downtrending.  Chronic diastolic CHF: Overall euvolemic.  Repeat echo shows EF 55 to 60% indeterminate diastolic filling, normal RV systolic function and size AND normal pulmonary artery systolic pressure. Not taking diuretics for 6 weeks  Or so but has not been eating Lot and reports weight loss of ~30 lb as in the same time frame.  BNP on lower end, resume her home Lasix.  Not taking Cardizem so currently on hold.  Will get gentle diuresis on board with home Lasix  COPD/centrally lobular emphysema with acute exacerbation: Due to pneumonia.  Wheezing is significantly better, continue systemic steroids bronchodilators.  Appreciate pulmonary input.  Chronic restrictive lung disease  secondary to super morbid obesity/COPD  Lactic acidosis due to respiratory distress/sepsis: It is resolved.  AKI creatinine peaked to 1.13 now improved to 0.6 monitor while resuming her Lasix. Recent Labs  Lab 05/24/20 1847 05/25/20 0254 05/26/20 0505  BUN 16 20 27*  CREATININE 0.90 1.13* 0.69   Anemia likely in the setting of chronic disease/?  Etiology- Check iron panel in a.m.  Morbid obesity with BMI 42.6 outpatient weight loss follow-up with PCP  OSA noncompliant with CPAP, untreated: BiPAP and supplemental oxygen as above  Primary malignant neoplasm of bronchus  of left lower lobe, " I have been dealing with it"- she says "it is stage 3 and never will be cured and learning to live with it", sees Dr  Julien Nordmann.   Malignant neoplasm of upper-outer quadrant of left breast in female, estrogen receptor negative triple negative status post lumpectomy . on adjuvant chemo with Adriamycin and Cytoxan, echocardiogram has been ordered.  GOC: we discussed goals of care CODE STATUS she would like to stay full code.  Palliative care consult has been requested given her complex comorbidities, at risk of decompensation.   Nutrition: Diet Order            DIET SOFT Room service appropriate? No; Fluid consistency: Thin  Diet effective now                  Body mass index is 42.62 kg/m.   DVT prophylaxis: enoxaparin (LOVENOX) injection 40 mg Start: 05/24/20 2200 Code Status:   Code Status: Full Code  Family Communication: plan of care discussed with patient at bedside.  Status is: Inpatient  Remains inpatient appropriate because:IV treatments appropriate due to intensity of illness or inability to take PO and Inpatient level of care appropriate due to severity of illness   Dispo: The patient is from: Home w/ husband, usually ambulatory              Anticipated d/c is to: Home              Anticipated d/c date is: > 3 days              Patient currently is medically stable to d/c.   Consultants:see note  Procedures:see note  Culture/Microbiology    Component Value Date/Time   SDES PLEURAL LEFT 05/25/2020 1236   SDES PLEURAL LEFT 05/25/2020 1236   [place] 05/25/2020 1236   SPECREQUEST NONE 05/25/2020 1236   CULT  05/25/2020 1236    NO GROWTH < 24 HOURS Performed at [facility], [place] [address], [place], [place] [address]    REPTSTATUS PENDING 05/25/2020 1236   REPTSTATUS 05/25/2020 FINAL 05/25/2020 1236    Other culture-see note  Medications: Scheduled Meds: . budesonide (PULMICORT) nebulizer solution  0.25 mg Nebulization BID  . Chlorhexidine Gluconate Cloth  6 each Topical Daily  . enoxaparin (LOVENOX) injection  40 mg Subcutaneous Q24H  . insulin aspart  0-9  Units Subcutaneous Q4H  . ipratropium  0.5 mg Nebulization Once  . mouth rinse  15 mL Mouth Rinse BID  . methylPREDNISolone (SOLU-MEDROL) injection  40 mg Intravenous Q8H  . sodium chloride flush  10-40 mL Intracatheter Q12H  . sodium chloride flush  3 mL Intravenous Q12H  . umeclidinium-vilanterol  1 puff Inhalation Daily   Continuous Infusions: . albuterol Stopped (05/24/20 2000)  . azithromycin 500 mg (05/25/20 2034)  . cefTRIAXone (ROCEPHIN)  IV 2 g (05/25/20 2330)    Antimicrobials: Anti-infectives (From admission, onward)   Start     Dose/Rate Route Frequency Ordered Stop   05/25/20 2100  cefTRIAXone (ROCEPHIN) 2 g in sodium chloride 0.9 % 100 mL IVPB        2 g 200 mL/hr over 30 Minutes Intravenous Every 24 hours 05/24/20 2126 05/29/20 2059   05/24/20 2200  azithromycin (ZITHROMAX) 500 mg in sodium chloride 0.9 % 250 mL IVPB        500 mg 250 mL/hr over 60 Minutes Intravenous Every 24 hours 05/24/20 2126 05/29/20 2159   05/24/20 2015  cefTRIAXone (ROCEPHIN) 2  g in sodium chloride 0.9 % 100 mL IVPB  Status:  Discontinued        2 g 200 mL/hr over 30 Minutes Intravenous Every 24 hours 05/24/20 2004 05/24/20 2128   05/24/20 2015  azithromycin (ZITHROMAX) 500 mg in sodium chloride 0.9 % 250 mL IVPB  Status:  Discontinued        500 mg 250 mL/hr over 60 Minutes Intravenous Every 24 hours 05/24/20 2004 05/24/20 2128     Objective: Vitals: Today's Vitals   05/26/20 0018 05/26/20 0105 05/26/20 0422 05/26/20 0738  BP:   112/64 120/64  Pulse:   75   Resp:   20   Temp:   97.9 F (36.6 C) 97.7 F (36.5 C)  TempSrc:   Axillary Oral  SpO2:      Weight:  105.7 kg    Height:      PainSc: Asleep       Intake/Output Summary (Last 24 hours) at 05/26/2020 1002 Last data filed at 05/26/2020 0515 Gross per 24 hour  Intake 590 ml  Output 220 ml  Net 370 ml   Filed Weights   05/25/20 1627 05/25/20 2237 05/26/20 0105  Weight: 104.6 kg 105.7 kg 105.7 kg   Weight change:    Intake/Output from previous day: 11/09 0701 - 11/10 0700 In: 590 [P.O.:240; IV Piggyback:350] Out: 220 [Urine:220] Intake/Output this shift: No intake/output data recorded.  Examination: General exam: AAOx3, obese, on HFNC, NAD, weak appearing. HEENT:Oral mucosa moist, Ear/Nose WNL grossly, dentition normal. Respiratory system: bilaterally air entry present with improved wheezing, no crackles,no use of accessory muscle Cardiovascular system: S1 & S2 +, No JVD,. Gastrointestinal system: Abdomen soft, NT,ND, BS+ Nervous System:Alert, awake, moving extremities and grossly nonfocal Extremities: No edema, distal peripheral pulses palpable.  Skin: No rashes,no icterus. MSK: Normal muscle bulk,tone, power  Data Reviewed: I have personally reviewed following labs and imaging studies CBC: Recent Labs  Lab 05/24/20 1847 05/24/20 2120 05/25/20 0254 05/26/20 0505  WBC 17.1*  --  17.0* 16.8*  NEUTROABS 16.4*  --   --   --   HGB 11.6* 12.6 10.4* 9.4*  HCT 40.3 37.0 36.3 32.7*  MCV 93.7  --  94.5 94.8  PLT 345  --  337 656   Basic Metabolic Panel: Recent Labs  Lab 05/24/20 1847 05/24/20 2120 05/25/20 0254 05/26/20 0505  NA 137 139 139 142  K 4.0 3.3* 3.5 4.0  CL 89*  --  90* 95*  CO2 34*  --  34* 39*  GLUCOSE 149*  --  226* 133*  BUN 16  --  20 27*  CREATININE 0.90  --  1.13* 0.69  CALCIUM 8.7*  --  8.5* 8.5*   GFR: Estimated Creatinine Clearance: 83.2 mL/min (by C-G formula based on SCr of 0.69 mg/dL). Liver Function Tests: Recent Labs  Lab 05/25/20 0254  AST 26  ALT 26  ALKPHOS 86  BILITOT 0.6  PROT 6.4*  ALBUMIN 1.8*   No results for input(s): LIPASE, AMYLASE in the last 168 hours. No results for input(s): AMMONIA in the last 168 hours. Coagulation Profile: Recent Labs  Lab 05/24/20 2004  INR 1.3*   Cardiac Enzymes: No results for input(s): CKTOTAL, CKMB, CKMBINDEX, TROPONINI in the last 168 hours. BNP (last 3 results) No results for input(s): PROBNP in  the last 8760 hours. HbA1C: Recent Labs    05/26/20 0505  HGBA1C 6.0*   CBG: Recent Labs  Lab 05/25/20 1558 05/25/20 1958 05/25/20 2302 05/26/20 0422  05/26/20 0733  GLUCAP 153* 118* 125* 122* 125*   Lipid Profile: No results for input(s): CHOL, HDL, LDLCALC, TRIG, CHOLHDL, LDLDIRECT in the last 72 hours. Thyroid Function Tests: No results for input(s): TSH, T4TOTAL, FREET4, T3FREE, THYROIDAB in the last 72 hours. Anemia Panel: No results for input(s): VITAMINB12, FOLATE, FERRITIN, TIBC, IRON, RETICCTPCT in the last 72 hours. Sepsis Labs: Recent Labs  Lab 05/24/20 2004 05/24/20 2335 05/25/20 0254 05/25/20 0520  LATICACIDVEN 2.3* 5.2* 3.6* 1.0    Recent Results (from the past 240 hour(s))  Respiratory Panel by RT PCR (Flu A&B, Covid) - Nasopharyngeal Swab     Status: None   Collection Time: 05/24/20  6:47 PM   Specimen: Nasopharyngeal Swab  Result Value Ref Range Status   SARS Coronavirus 2 by RT PCR NEGATIVE NEGATIVE Final    Comment: (NOTE) SARS-CoV-2 target nucleic acids are NOT DETECTED.  The SARS-CoV-2 RNA is generally detectable in upper respiratoy specimens during the acute phase of infection. The lowest concentration of SARS-CoV-2 viral copies this assay can detect is 131 copies/mL. A negative result does not preclude SARS-Cov-2 infection and should not be used as the sole basis for treatment or other patient management decisions. A negative result may occur with  improper specimen collection/handling, submission of specimen other than nasopharyngeal swab, presence of viral mutation(s) within the areas targeted by this assay, and inadequate number of viral copies (<131 copies/mL). A negative result must be combined with clinical observations, patient history, and epidemiological information. The expected result is Negative.  Fact Sheet for Patients:  PinkCheek.be  Fact Sheet for Healthcare Providers:   GravelBags.it  This test is no t yet approved or cleared by the [place] FDA and  has been authorized for detection and/or diagnosis of SARS-CoV-2 by FDA under an Emergency Use Authorization (EUA). This EUA will remain  in effect (meaning this test can be used) for the duration of the COVID-19 declaration under Section 564(b)(1) of the Act, 21 U.S.C. section 360bbb-3(b)(1), unless the authorization is terminated or revoked sooner.     Influenza A by PCR NEGATIVE NEGATIVE Final   Influenza B by PCR NEGATIVE NEGATIVE Final    Comment: (NOTE) The Xpert Xpress SARS-CoV-2/FLU/RSV assay is intended as an aid in  the diagnosis of influenza from Nasopharyngeal swab specimens and  should not be used as a sole basis for treatment. Nasal washings and  aspirates are unacceptable for Xpert Xpress SARS-CoV-2/FLU/RSV  testing.  Fact Sheet for Patients: PinkCheek.be  Fact Sheet for Healthcare Providers: GravelBags.it  This test is not yet approved or cleared by the [place] FDA and  has been authorized for detection and/or diagnosis of SARS-CoV-2 by  FDA under an Emergency Use Authorization (EUA). This EUA will remain  in effect (meaning this test can be used) for the duration of the  Covid-19 declaration under Section 564(b)(1) of the Act, 21  U.S.C. section 360bbb-3(b)(1), unless the authorization is  terminated or revoked. Performed at [facility], [place] [address] [place], [place] [address]   Blood Culture (routine x 2)     Status: None (Preliminary result)   Collection Time: 05/24/20  8:40 PM   Specimen: BLOOD RIGHT ARM  Result Value Ref Range Status   Specimen Description BLOOD RIGHT ARM  Final   Special Requests   Final    BOTTLES DRAWN AEROBIC AND ANAEROBIC Blood Culture adequate volume   Culture   Final    NO GROWTH 2 DAYS Performed at [facility], [place] [address],  [place], [place] [address]    Report Status PENDING  Incomplete  Urine culture     Status: Abnormal   Collection Time: 05/24/20  8:50 PM   Specimen: Urine, Clean Catch  Result Value Ref Range Status   Specimen Description URINE, CLEAN CATCH  Final   Special Requests   Final    NONE Performed at [facility], [place] [address], [place], [place] [address]    Culture MULTIPLE SPECIES PRESENT, SUGGEST RECOLLECTION (A)  Final   Report Status 05/26/2020 FINAL  Final  Blood Culture (routine x 2)     Status: None (Preliminary result)   Collection Time: 05/24/20  9:06 PM   Specimen: BLOOD RIGHT HAND  Result Value Ref Range Status   Specimen Description BLOOD RIGHT HAND  Final   Special Requests   Final    BOTTLES DRAWN AEROBIC AND ANAEROBIC Blood Culture results may not be optimal due to an inadequate volume of blood received in culture bottles   Culture   Final    NO GROWTH 2 DAYS Performed at [facility], [place] [address], [place], [place] [address]    Report Status PENDING  Incomplete  Culture, body fluid-bottle     Status: None (Preliminary result)   Collection Time: 05/25/20 12:36 PM   Specimen: Pleura  Result Value Ref Range Status   Specimen Description PLEURAL LEFT  Final   Special Requests NONE  Final   Culture   Final    NO GROWTH < 24 HOURS Performed at [facility], [place] [address], [place], [place] [address]    Report Status PENDING  Incomplete  Gram stain     Status: None   Collection Time: 05/25/20 12:36 PM   Specimen: Pleura  Result Value Ref Range Status   Specimen Description PLEURAL LEFT  Final   Special Requests NONE  Final   Gram Stain   Final    WBC PRESENT,BOTH PMN AND MONONUCLEAR RED BLOOD CELLS PRESENT NO ORGANISMS SEEN CYTOSPIN SMEAR Performed at [facility], [address] [place], [place] [address]    Report Status 05/25/2020 FINAL  Final     Radiology Studies: DG Chest Port 1 View  Result Date: 05/26/2020 CLINICAL DATA:   Pneumonia EXAM: PORTABLE CHEST 1 VIEW COMPARISON:  Yesterday FINDINGS: Unchanged extensive opacification of the left chest with volume loss. Relatively clear right lung although there are bronchograms at the medial right base. Stable heart size accentuated by mediastinal fat based on most recent CT. Porta catheter on the right with tip at the upper right atrium. IMPRESSION: Stable left more than right infiltrates. Electronically Signed   By: Monte Fantasia M.D.   On: 05/26/2020 07:57   DG Chest Port 1 View  Result Date: 05/25/2020 CLINICAL DATA:  Status post thoracentesis EXAM: PORTABLE CHEST 1 VIEW COMPARISON:  05/24/2020 FINDINGS: Cardiac shadow is stable. Aortic calcifications are again seen. Slight reduction in left pleural effusion is noted without evidence of pneumothorax. Patchy infiltrate remains in the left lung. Right chest wall port is again seen. IMPRESSION: No pneumothorax following thoracentesis. Persistent left-sided infiltrate is noted. Electronically Signed   By: Inez Catalina M.D.   On: 05/25/2020 13:20   DG Chest Port 1 View  Result Date: 05/24/2020 CLINICAL DATA:  64 year old female with shortness of breath. EXAM: PORTABLE CHEST 1 VIEW COMPARISON:  Chest radiograph dated 04/19/2018 and CT dated 01/26/2020. FINDINGS: Right-sided Port-A-Cath with tip close to  the cavoatrial junction. There is small to moderate left pleural effusion with collapse of the majority of the left lung. There is diffuse interstitial and streaky density in the aerated portion of the left lung which may represent atelectasis, asymmetric edema, or pneumonia. Clinical correlation is recommended. There is background of emphysema. Faint right lung base reticulonodular densities, likely atelectasis. There is no pneumothorax. Stable cardiomediastinal silhouette. Atherosclerotic calcification of the aorta. No acute osseous pathology. IMPRESSION: 1. Small to moderate left pleural effusion with collapse of the majority of the  left lung. 2. Diffuse interstitial and streaky density in the aerated portion of the left lung may represent atelectasis, asymmetric edema, or pneumonia. Electronically Signed   By: Anner Crete M.D.   On: 05/24/2020 17:59   ECHOCARDIOGRAM COMPLETE  Result Date: 05/25/2020    ECHOCARDIOGRAM REPORT   Patient Name:   AYONA YNIGUEZ Date of Exam: 05/25/2020 Medical Rec #:  [id number]        Height:       62.0 in Accession #:    [id number]       Weight:       238.1 lb Date of Birth:  [birth year]       BSA:          2.059 m Patient Age:    44 years         BP:           108/61 mmHg Patient Gender: F                HR:           119 bpm. Exam Location:  Inpatient Procedure: 2D Echo Indications:    Dyspnea R06.00  History:        Patient has prior history of Echocardiogram examinations, most                 recent 01/27/2020. CHF, COPD; Risk Factors:Hypertension and                 Former Smoker.  Sonographer:    Mikki Santee RDCS (AE) Referring Phys: [id number] [place]  1. Left ventricular ejection fraction, by estimation, is 55 to 60%. The left ventricle has normal function. The left ventricle has no regional wall motion abnormalities. Indeterminate diastolic filling due to E-A fusion.  2. Right ventricular systolic function is normal. The right ventricular size is normal. There is normal pulmonary artery systolic pressure. The estimated right ventricular systolic pressure is 46.2 mmHg.  3. The mitral valve is normal in structure. No evidence of mitral valve regurgitation. No evidence of mitral stenosis.  4. The aortic valve is normal in structure. Aortic valve regurgitation is not visualized. No aortic stenosis is present.  5. The inferior vena cava is normal in size with greater than 50% respiratory variability, suggesting right atrial pressure of 3 mmHg. Comparison(s): No significant change from prior study. Prior images reviewed side by side. FINDINGS  Left Ventricle: Left ventricular  ejection fraction, by estimation, is 55 to 60%. The left ventricle has normal function. The left ventricle has no regional wall motion abnormalities. The left ventricular internal cavity size was normal in size. There is  no left ventricular hypertrophy. Indeterminate diastolic filling due to E-A fusion. (better information may be obtained when heart rate is normal). Right Ventricle: The right ventricular size is normal. No increase in right ventricular wall thickness. Right ventricular systolic function is normal. There is normal pulmonary artery systolic pressure. The tricuspid  regurgitant velocity is 2.83 m/s, and  with an assumed right atrial pressure of 3 mmHg, the estimated right ventricular systolic pressure is 37.1 mmHg. Left Atrium: Left atrial size was normal in size. Right Atrium: Right atrial size was normal in size. Pericardium: There is no evidence of pericardial effusion. Mitral Valve: The mitral valve is normal in structure. No evidence of mitral valve regurgitation. No evidence of mitral valve stenosis. Tricuspid Valve: The tricuspid valve is normal in structure. Tricuspid valve regurgitation is mild . No evidence of tricuspid stenosis. Aortic Valve: The aortic valve is normal in structure. Aortic valve regurgitation is not visualized. No aortic stenosis is present. Pulmonic Valve: The pulmonic valve was normal in structure. Pulmonic valve regurgitation is not visualized. No evidence of pulmonic stenosis. Aorta: The aortic root is normal in size and structure. Venous: The inferior vena cava is normal in size with greater than 50% respiratory variability, suggesting right atrial pressure of 3 mmHg. IAS/Shunts: No atrial level shunt detected by color flow Doppler.  LEFT VENTRICLE PLAX 2D LVIDd:         4.20 cm LVIDs:         2.70 cm LV PW:         1.00 cm LV IVS:        1.00 cm LVOT diam:     2.10 cm LV SV:         57 LV SV Index:   28 LVOT Area:     3.46 cm  RIGHT VENTRICLE RV S prime:     19.70  cm/s TAPSE (M-mode): 1.6 cm LEFT ATRIUM           Index       RIGHT ATRIUM           Index LA diam:      2.90 cm 1.41 cm/m  RA Area:     14.00 cm LA Vol (A4C): 47.0 ml 22.83 ml/m RA Volume:   31.80 ml  15.45 ml/m  AORTIC VALVE LVOT Vmax:   105.00 cm/s LVOT Vmean:  74.300 cm/s LVOT VTI:    0.166 m  AORTA Ao Root diam: 2.70 cm TRICUSPID VALVE TR Peak grad:   32.0 mmHg TR Vmax:        283.00 cm/s  SHUNTS Systemic VTI:  0.17 m Systemic Diam: 2.10 cm Dani Gobble Croitoru MD Electronically signed by Sanda Klein MD Signature Date/Time: 05/25/2020/3:58:39 PM    Final    IR THORACENTESIS ASP PLEURAL SPACE W/IMG GUIDE  Result Date: 05/25/2020 INDICATION: Patient with history of lung cancer, breast cancer with emphysema and chronic hypoxic respiratory failure found to have a left-sided pleural effusion. Request is for therapeutic and diagnostic left-sided thoracentesis EXAM: ULTRASOUND GUIDED THERAPEUTIC AND DIAGNOSTIC THORACENTESIS MEDICATIONS: LIDOCAINE 1% 10 ML COMPLICATIONS: None immediate. PROCEDURE: An ultrasound guided thoracentesis was thoroughly discussed with the patient and questions answered. The benefits, risks, alternatives and complications were also discussed. The patient understands and wishes to proceed with the procedure. Written consent was obtained. Ultrasound was performed to localize and mark an adequate pocket of fluid in the left chest. The area was then prepped and draped in the normal sterile fashion. 1% Lidocaine was used for local anesthesia. Under ultrasound guidance a 6 Fr Safe-T-Centesis catheter was introduced. Thoracentesis was performed. The catheter was removed and a dressing applied. The patient unable to maintain position and the procedure was aborted. FINDINGS: A total of approximately 520 mL of straw-colored fluid was removed. Samples were sent to the laboratory  as requested by the clinical team. IMPRESSION: Successful ultrasound guided therapeutic and diagnostic left-sided  thoracentesis yielding 520 mL of pleural fluid. Read by: Rushie Nyhan, NP Electronically Signed   By: Jacqulynn Cadet M.D.   On: 05/25/2020 13:42     LOS: 2 days   Antonieta Pert, MD [facility]  05/26/2020, 10:02 AM

## 2020-05-26 NOTE — Plan of Care (Signed)

## 2020-05-26 NOTE — Progress Notes (Signed)
Pt. Peripheral IV to RUA infiltrated. IV team was consulted, porta cath to right upper chest was accessed by IV team nurse and is now being used for IV meds. And lab draws.

## 2020-05-26 NOTE — Progress Notes (Signed)
NAME:  Deborah [name], MRN:  [id number], DOB:  Dec 07, 1955, LOS: 2 ADMISSION DATE:  05/24/2020, CONSULTATION DATE:  05/25/2020 REFERRING MD:  Dr. Lupita Leash, CHIEF COMPLAINT:  SOB/ hypoxic  Brief History   5 yoF currently under treatment for breast cancer and hx of lung cancer, emphysema and chronic hypoxic respiratory failure presenting with progressive SOB and hypoxia found to have moderate left pleural effusion being treated for CAP and acute COPD exacerbation.  PCCM called for further pulmonary recommendations.   History of present illness   64 year old female with history of chronic hypoxic respiratory failure on 4 [place], centrilobular emphysema/ COPD, OSA not compliant with CPAP, former smoker, Stage III squamous cell carcinoma of left lung (2019) s/p chemo, triple negative breast cancer (dx 02/2020), HFpEF, HTN, and pulmonary hypertension.  She is followed in our office, last seen 04/14/2020 by Dr. Silas Flood, doing well on Breztri and prn duonebs nebs.  However, she states she has since stopped her Breztri, without a specific reason but seemed more overwhelmed with all her medical issues and recent chemo.      She presented 11/8 with progressively worsening hypoxia and shortness of breath to the point of being short of breath at rest and having desaturations at home despite her 4L [place].  Reports getting down to 44% at home with exertion.  Additionally she stopped taking her lasix last week given concern of falling/ syncope with going to bathroom frequently.  She reports 27 lb weight loss over the last 6 weeks with decreased PO intake.  Denies any recent fever, chest pain, or lower extremity swelling.  Does have a productive cough with brownish- yellow sputum.    Of noted, she is followed by Dr. [name] with oncology.  Recent 02/2020 diagnoses of triple negative breast cancer s/p left lumpectomy currently on adriamycin and cytoxan.  On chart review, she did not tolerate her first round of chemo given extreme  fatigue, SOB, increased heart rate, and generalized failure to thrive.  Her dose was significantly lowered and with oncology not recommending any further chemotherapy for her.   Found by EMS with saturations into the 70's on her 4L [place].  ER course noted for tachycardia into the 130-140s (noted to be tachycardic chronically since starting chemo), tachypneic, and normotensive but requiring BiPAP to maintain her saturations.  Labs significant for WBC 17, Hgb 11.6, bicarb 34, BNP 198, lactic 5.2-> 3.6-> 1, trop hs 20, glucose 226, UA neg, and CXR showing small to moderate left pleural effusion with collapse of majority of left lung with interstitial and streaky density in the aerated portion of the left lung representing atelectasis verus edema vs developing pneumonia. IR consulted for left thoracentesis.  Cultures sent and started on empiric azithromycin and ceftriaxone, nebs, and given lasix.  ABG noted 7.31/ 76/ 68.   Required BiPAP overnight but now on HFNC 12L.  PCCM consulted for further pulmonary recommendations.    Past Medical History  Chronic hypoxic respiratory failure on 4 [place], centrilobular emphysema/ COPD, OSA not compliant with CPAP, former smoker, Stage III squamous cell carcinoma of left lung (2019) s/p chemo, breast cancer (dx 02/2020), HFpEF, HTN, pulmonary hypertension  Significant Hospital Events   11/8 admitted [facility]  Consults:  IR Pulmonary   Procedures:   Significant Diagnostic Tests:   Micro Data:  11/8 SARS2/ flu >> neg 11/8 MRSA >> 11/8 BCx 2 >>  11/9 strep urinary ag >> 11/9 [place] >>  Antimicrobials:  11/8 ceftriaxone >>  11/8 azithromycin >>  Interim history/subjective:   Objective   Blood pressure 120/64, pulse 75, temperature 97.7 F (36.5 C), temperature source Oral, resp. rate 20, height 5\' 2"  (1.575 m), weight 105.7 kg, SpO2 93 %.        Intake/Output Summary (Last 24 hours) at 05/26/2020 0917 Last data filed at 05/26/2020 0515 Gross per 24 hour   Intake 590 ml  Output 220 ml  Net 370 ml   Filed Weights   05/25/20 1627 05/25/20 2237 05/26/20 0105  Weight: 104.6 kg 105.7 kg 105.7 kg    Examination: General:  Obese, chronically ill appearing female in mild distress Neuro: Alert/ oriented x 4, MAE CV: ST, rr, no murmur PULM:  tachypneic with mild increased work of breathing, diffuse inspiratory/ expiratory wheezing, scattered rhonchi.  Thick brownish/ yellow sputum noted in bedside pan  GI: soft, bs+  Extremities: warm/dry, no LE edema  Skin: no rashes  Resolved Hospital Problem list    Assessment & Plan:   Acute on chronic hypoxic respiratory failure, baseline 4L [place] Acute exacerbation of centrilobular emphysema/ COPD OSA, untreated, possible component of OHS Left pleural effusion  CAP  P:   FiO2 been decreased from 15 L nasal cannula to 10 with sats of 92%  As needed noninvasive mechanical ventilatory support  Continue antimicrobial therapy  Give consideration to gentle diuresis  Pulmonary toilet  Wean steroids  Consider palliative care consult        PCCM available as needed  Best practice:  Diet: per primary  Pain/Anxiety/Delirium protocol (if indicated): n/a VAP protocol (if indicated): n/a DVT prophylaxis: lovenox GI prophylaxis: n/a Glucose control: adding SSI Mobility: BR Code Status: Dr. Carlis Abbott did discuss with patient at bedside.  Initially she said no to intubation if she were to decompensate but yes to CPR, but after discussing what transition to comfort care would look like if she were to decompensate, patient seems hesitate and needed to discuss with her husband.  Remains full code.  Recommend palliative care consultation Family Communication: Patient and husband updated at bedside 05/26/2020 Disposition: PCU  Labs   CBC: Recent Labs  Lab 05/24/20 1847 05/24/20 2120 05/25/20 0254 05/26/20 0505  WBC 17.1*  --  17.0* 16.8*  NEUTROABS 16.4*  --   --   --   HGB 11.6* 12.6 10.4*  9.4*  HCT 40.3 37.0 36.3 32.7*  MCV 93.7  --  94.5 94.8  PLT 345  --  337 182    Basic Metabolic Panel: Recent Labs  Lab 05/24/20 1847 05/24/20 2120 05/25/20 0254 05/26/20 0505  NA 137 139 139 142  K 4.0 3.3* 3.5 4.0  CL 89*  --  90* 95*  CO2 34*  --  34* 39*  GLUCOSE 149*  --  226* 133*  BUN 16  --  20 27*  CREATININE 0.90  --  1.13* 0.69  CALCIUM 8.7*  --  8.5* 8.5*   GFR: Estimated Creatinine Clearance: 83.2 mL/min (by C-G formula based on SCr of 0.69 mg/dL). Recent Labs  Lab 05/24/20 1847 05/24/20 2004 05/24/20 2335 05/25/20 0254 05/25/20 0520 05/26/20 0505  WBC 17.1*  --   --  17.0*  --  16.8*  LATICACIDVEN  --  2.3* 5.2* 3.6* 1.0  --     Liver Function Tests: Recent Labs  Lab 05/25/20 0254  AST 26  ALT 26  ALKPHOS 86  BILITOT 0.6  PROT 6.4*  ALBUMIN 1.8*   No results for input(s): LIPASE, AMYLASE in the last 168 hours. No  results for input(s): AMMONIA in the last 168 hours.  ABG    Component Value Date/Time   PHART 7.310 (L) 05/24/2020 2120   PCO2ART 76.8 (HH) 05/24/2020 2120   PO2ART 68 (L) 05/24/2020 2120   HCO3 38.6 (H) 05/24/2020 2120   TCO2 41 (H) 05/24/2020 2120   O2SAT 90.0 05/24/2020 2120     Coagulation Profile: Recent Labs  Lab 05/24/20 2004  INR 1.3*    Cardiac Enzymes: No results for input(s): CKTOTAL, CKMB, CKMBINDEX, TROPONINI in the last 168 hours.  HbA1C: Hemoglobin A1C  Date/Time Value Ref Range Status  11/12/2019 03:37 PM 5.5 4.0 - 5.6 % Final   Hgb A1c MFr Bld  Date/Time Value Ref Range Status  05/26/2020 05:05 AM 6.0 (H) 4.8 - 5.6 % Final    Comment:    (NOTE) Pre diabetes:          5.7%-6.4%  Diabetes:              >6.4%  Glycemic control for   <7.0% adults with diabetes   07/25/2014 06:00 PM 6.2 (H) <5.7 % Final    Comment:    (NOTE)                                                                       According to the ADA Clinical Practice Recommendations for 2011, when HbA1c is used as a  screening test:  >=6.5%   Diagnostic of Diabetes Mellitus           (if abnormal result is confirmed) 5.7-6.4%   Increased risk of developing Diabetes Mellitus References:Diagnosis and Classification of Diabetes Mellitus,Diabetes TKWI,0973,53(GDJME 1):S62-S69 and Standards of Medical Care in         Diabetes - 2011,Diabetes Care,2011,34 (Suppl 1):S11-S61.     CBG: Recent Labs  Lab 05/25/20 1558 05/25/20 1958 05/25/20 2302 05/26/20 0422 05/26/20 0733  GLUCAP 153* 118* 125* 122* 125*        Steve Sani Loiseau ACNP Acute Care Nurse Practitioner [place] Please consult Amion 05/26/2020, 9:18 AM

## 2020-05-27 ENCOUNTER — Ambulatory Visit: Payer: BLUE CROSS/BLUE SHIELD

## 2020-05-27 DIAGNOSIS — Z515 Encounter for palliative care: Secondary | ICD-10-CM

## 2020-05-27 DIAGNOSIS — J9601 Acute respiratory failure with hypoxia: Secondary | ICD-10-CM

## 2020-05-27 DIAGNOSIS — Z7189 Other specified counseling: Secondary | ICD-10-CM

## 2020-05-27 LAB — BASIC METABOLIC PANEL
Anion gap: 8 (ref 5–15)
BUN: 22 mg/dL (ref 8–23)
CO2: 43 mmol/L — ABNORMAL HIGH (ref 22–32)
Calcium: 8.4 mg/dL — ABNORMAL LOW (ref 8.9–10.3)
Chloride: 93 mmol/L — ABNORMAL LOW (ref 98–111)
Creatinine, Ser: 0.7 mg/dL (ref 0.44–1.00)
GFR, Estimated: >60 mL/min (ref >60)
Glucose, Bld: 179 mg/dL — ABNORMAL HIGH (ref 70–99)
Potassium: 3.8 mmol/L (ref 3.5–5.1)
Sodium: 144 mmol/L (ref 135–145)

## 2020-05-27 LAB — GLUCOSE, CAPILLARY
Glucose-Capillary: 142 mg/dL — ABNORMAL HIGH (ref 70–99)
Glucose-Capillary: 141 mg/dL — ABNORMAL HIGH (ref 70–99)

## 2020-05-27 LAB — CBC
HCT: 31.7 % — ABNORMAL LOW (ref 36.0–46.0)
Hemoglobin: 9.4 g/dL — ABNORMAL LOW (ref 12.0–15.0)
MCH: 27.9 pg (ref 26.0–34.0)
MCHC: 29.7 g/dL — ABNORMAL LOW (ref 30.0–36.0)
MCV: 94.1 fL (ref 80.0–100.0)
Platelets: 370 10*3/uL (ref 150–400)
RBC: 3.37 MIL/uL — ABNORMAL LOW (ref 3.87–5.11)
RDW: 15 % (ref 11.5–15.5)
WBC: 12.6 10*3/uL — ABNORMAL HIGH (ref 4.0–10.5)
nRBC: 0.4 % — ABNORMAL HIGH (ref 0.0–0.2)

## 2020-05-27 MED ORDER — IPRATROPIUM-ALBUTEROL 0.5-2.5 (3) MG/3ML IN SOLN: 3.0000 mL | Freq: Three times a day (TID) | RESPIRATORY_TRACT | Status: DC

## 2020-05-27 MED ORDER — IPRATROPIUM-ALBUTEROL 0.5-2.5 (3) MG/3ML IN SOLN
3.0000 mL | Freq: Three times a day (TID) | RESPIRATORY_TRACT | Status: DC
  Administered 2020-05-27 – 2020-06-02 (×19): 3 mL via RESPIRATORY_TRACT
  Filled 2020-05-27 (×19): qty 3

## 2020-05-27 MED ORDER — DEXTROSE 50 % IV SOLN
INTRAVENOUS | Status: AC
  Filled 2020-05-27: qty 50

## 2020-05-27 MED ORDER — GUAIFENESIN-DM 100-10 MG/5ML PO SYRP
5.0000 mL | ORAL_SOLUTION | ORAL | Status: DC | PRN
  Administered 2020-05-27 – 2020-06-01 (×17): 5 mL via ORAL
  Filled 2020-05-27 (×16): qty 5

## 2020-05-27 NOTE — Plan of Care (Signed)

## 2020-05-27 NOTE — Consult Note (Signed)
Consultation Note Date: 05/27/2020   Patient Name: Deborah [name]  DOB: 03-09-1956  MRN: [id number]  Age / Sex: 64 y.o., female  PCP: Pleas Koch, NP Referring Physician: Antonieta Pert, MD  Reason for Consultation: Establishing goals of care  HPI/Patient Profile: 64 y.o. female  with past medical history of CHF, COPD on home oxygen, triple negative breast cancer status post lumpectomy with negative margins and negative lymph nodes and adjuvant chemotherapy that has been stopped due to intolerance, non-small cell lung cancer status post concurrent chemoradiation with follow-up consolidation therapy currently on surveillance with no evidence of recurrence admitted on 05/24/2020 with shortness of breath, satting in the 70s, requiring BiPAP, she has now been transitioned to at 10 L/min.  Work-up revealed pneumonia, severe sepsis, pleural effusions for which she had a thoracentesis yielding 520 mL of fluid-sent for pathology appears to be exudative with increased total nucleated cell counts and increased neutrophils, COPD exacerbation, and CHF exacerbation worsened by the fact that she did not take her Lasix for several days as well as failure to thrive from chemotherapy.  Palliative medicine consulted for goals of care discussion.  Clinical Assessment and Goals of Care:  I have reviewed medical records including EPIC notes, labs and imaging, examined the patient and met at bedside with the patient and her husband to discuss diagnosis prognosis, GOC, EOL wishes, disposition and options.  I introduced Palliative Medicine as specialized medical care for people living with serious illness. It focuses on providing relief from the symptoms and stress of a serious illness.   She immediately became tearful saying she was having a bad day, she had just received a phone call that her sister was found in her home having just  died.  Emotional support was provided.   We discussed a brief life review of the patient.  She is married has children and several grandchildren.  Lives at home with her husband who is still working.  She finds joy in spending time with her grandchildren.  As far as functional and nutritional status-prior to admission she was independent with ADLs but primarily chair and bedbound.  She pretty much ambulates only to the bathroom with assistance of a walker.  She is not able to prepare her own meals, her husband makes food for her before he goes to work.  She has not had problems with her appetite.  We discussed her current illness and what it means in the larger context of her on-going co-morbidities.  Natural disease trajectory and expectations at EOL were discussed.  At this point her cancer does not seem to be her primary problem rather her CHF and COPD which are likely to worsen over time.  I attempted to elicit values and goals of care important to the patient.  Remaining at home in being somewhat functional are her main goals of care.  She would not want to reside in a nursing facility she would consider going for short time for rehab but would rather avoid this.  Her husband  supports this, he had a bad experience with his mother who was in a nursing facility for rehab.  Advanced directives, concepts specific to code status, artifical feeding and hydration, and rehospitalization were considered and discussed.  A copy of advance directives were given to [facility] and she has our contact information if she would wish to complete these, she did not wish to complete them during our meeting.  She and her husband both expressed that she would not want to experience suffering at end-of-life.   If she were to be on artificial life prolonging measures, she would defer all decision making to her husband.   CODE STATUS was discussed.  Her husband shared that his mother had CPR and it hurt her more than helped her  and he worries the same for his wife.  I concurred, sharing my worries that if Yocelyn worsened to the point that her heart stopped and she stopped breathing, then putting her through CPR with chest compressions, shocks, and going on a ventilator, would not bring her back to a functional state where she would continue to live independently which is her stated goal.  Palliative Care services outpatient were explained and offered patient is agreeable to follow-up with outpatient palliative..  Questions and concerns were addressed.  The family was encouraged to call with questions or concerns.   Hard choices book was left for review.  Primary Decision Maker PATIENT    SUMMARY OF RECOMMENDATIONS -Full code, full scope care -Refer for outpatient palliative follow-up     Code Status/Advance Care Planning:  Full code  Psycho-social/Spiritual:   Desire for further Chaplaincy support:yes  Prognosis:    Unable to determine  Discharge Planning: Home with Home Healthand Palliative  Primary Diagnoses: Present on Admission: . Acute on chronic respiratory failure with hypoxia ([place]) . Chronic diastolic heart failure ([place]) . Chronic respiratory failure ([place]) . COPD (chronic obstructive pulmonary disease) ([place]) . COPD exacerbation ([place]) . Morbid obesity ([place]) . OSA (obstructive sleep apnea) . Primary malignant neoplasm of bronchus of left lower lobe ([place]) . Pulmonary HTN ([place])   I have reviewed the medical record, interviewed the patient and family, and examined the patient. The following aspects are pertinent.  Past Medical History:  Diagnosis Date  . Anemia    as teen  . Asthma   . Breast cancer ([place])    left breast cancer 02/2020  . CHF (congestive heart failure) ([place])   . COPD, mild ([place])   . Depression   . Diverticulitis   . Family history of anesthesia complication    vomiting  . GI bleeding   . Heart failure ([place])    New onset 07/25/14  . Histoplasmosis    left eye  .  Hyperkalemia   . Hypertension   . Lung cancer ([place]) 05/02/2018   s/p chemoradiation, immunotherapy  . Obesity (BMI 30-39.9)   . Pneumonia    dx wtih pneumonia on 05/27/16- seen by [facility]   . PONV (postoperative nausea and vomiting)   . Restrictive lung disease   . Shortness of breath    with exertion   . Sleep apnea    mask and oxygen at nite for sleep at 2L   . Tobacco abuse   . Umbilical hernia    Social History   Socioeconomic History  . Marital status: Married    Spouse name: Not on file  . Number of children: 1  . Years of education: Not on file  . Highest education level:  Not on file  Occupational History  . Occupation: Retired    Fish farm manager: NOT EMPLOYED  Tobacco Use  . Smoking status: Former Smoker    Packs/day: 3.00    Years: 43.00    Pack years: 129.00    Types: Cigarettes    Quit date: 12/17/2012    Years since quitting: 7.4  . Smokeless tobacco: Never Used  Vaping Use  . Vaping Use: Former  Substance and Sexual Activity  . Alcohol use: No    Alcohol/week: 0.0 standard drinks  . Drug use: No  . Sexual activity: Not on file  Other Topics Concern  . Not on file  Social History Narrative   ** Merged History Encounter **       ** Data from: 07/25/14 Enc Dept: [facility]EMERGENCY DEPT       ** Data from: 06/05/13 Enc Dept: [facility]   Married, one son 37 yo.            Social Determinants of Health   Financial Resource Strain:   . Difficulty of Paying Living Expenses: Not on file  Food Insecurity:   . Worried About Charity fundraiser in the Last Year: Not on file  . Ran Out of Food in the Last Year: Not on file  Transportation Needs:   . Lack of Transportation (Medical): Not on file  . Lack of Transportation (Non-Medical): Not on file  Physical Activity:   . Days of Exercise per Week: Not on file  . Minutes of Exercise per Session: Not on file  Stress:   . Feeling of Stress : Not on file  Social Connections:   . Frequency of Communication  with Friends and Family: Not on file  . Frequency of Social Gatherings with Friends and Family: Not on file  . Attends Religious Services: Not on file  . Active Member of Clubs or Organizations: Not on file  . Attends Archivist Meetings: Not on file  . Marital Status: Not on file   Scheduled Meds: . aspirin  81 mg Oral Daily  . budesonide (PULMICORT) nebulizer solution  0.25 mg Nebulization BID  . Chlorhexidine Gluconate Cloth  6 each Topical Daily  . dextrose      . doxycycline  100 mg Oral Q12H  . enoxaparin (LOVENOX) injection  40 mg Subcutaneous Q24H  . furosemide  40 mg Oral Daily  . ipratropium  0.5 mg Nebulization Once  . ipratropium-albuterol  3 mL Nebulization TID  . mouth rinse  15 mL Mouth Rinse BID  . methylPREDNISolone (SOLU-MEDROL) injection  40 mg Intravenous Q8H  . montelukast  10 mg Oral QHS  . sodium chloride flush  10-40 mL Intracatheter Q12H  . sodium chloride flush  3 mL Intravenous Q12H  . umeclidinium-vilanterol  1 puff Inhalation Daily   Continuous Infusions: . cefTRIAXone (ROCEPHIN)  IV 2 g (05/26/20 2006)   PRN Meds:.acetaminophen **OR** acetaminophen, guaiFENesin-dextromethorphan, ipratropium-albuterol, sodium chloride flush, traMADol Medications Prior to Admission:  Prior to Admission medications   Medication Sig Start Date End Date Taking? Authorizing Provider  acetaminophen (TYLENOL) 325 MG tablet Take 650 mg by mouth every 6 (six) hours as needed for headache (pain).   Yes [provider]  albuterol (VENTOLIN HFA) 108 (90 Base) MCG/ACT inhaler Inhale 2 puffs into the lungs every 6 (six) hours as needed for wheezing or shortness of breath. 01/16/20  Yes Brand Males, MD  aspirin 81 MG chewable tablet Chew 1 tablet (81 mg total) by mouth daily.  10/05/14  Yes [name], MD  Aspirin-Caffeine [facility] FAST PAIN RELIEF ARTHRITIS PO) Take 1 packet by mouth daily as needed (back pain).   Yes [provider]   Budeson-Glycopyrrol-Formoterol (BREZTRI AEROSPHERE) 160-9-4.8 MCG/ACT AERO Inhale 2 puffs into the lungs daily. Patient taking differently: Inhale 2 puffs into the lungs daily as needed (shortness of breath/wheezing).  01/14/20  Yes Brand Males, MD  diltiazem (CARDIZEM CD) 240 MG 24 hr capsule TAKE 1 CAPSULE BY MOUTH EVERY DAY Patient taking differently: Take 240 mg by mouth daily.  05/21/20  Yes [name], MD  furosemide (LASIX) 20 MG tablet TAKE 2 TABLETS BY MOUTH EVERY DAY Patient taking differently: Take 40 mg by mouth daily.  05/03/20  Yes Pleas Koch, NP  ipratropium-albuterol (DUONEB) 0.5-2.5 (3) MG/3ML SOLN Take 3 mLs by nebulization every 3 (three) hours as needed. Patient taking differently: Take 3 mLs by nebulization every 3 (three) hours as needed (shortness of breath/wheezing).  04/14/20  Yes Hunsucker, Bonna Gains, MD  lidocaine-prilocaine (EMLA) cream Apply 1 application topically as needed (prior to port access).   Yes [provider]  montelukast (SINGULAIR) 10 MG tablet Take 1 tablet (10 mg total) by mouth at bedtime. 12/31/19  Yes Pleas Koch, NP  OXYGEN Inhale 4 L/min into the lungs continuous.    Yes [provider]  potassium chloride SA (KLOR-CON M20) 20 MEQ tablet TAKE 1/2 TABLET BY MOUTH EVERY DAY Patient taking differently: Take 10 mEq by mouth daily.  05/18/20  Yes Pleas Koch, NP  traMADol (ULTRAM) 50 MG tablet Take 1 tablet (50 mg total) by mouth every 6 (six) hours as needed for moderate pain or severe pain. 04/08/20  Yes Alphonsa Overall, MD  Spacer/Aero-Holding Chambers (AEROCHAMBER MV) inhaler Use as instructed 01/01/17   Parrett, Fonnie Mu, NP  prochlorperazine (COMPAZINE) 10 MG tablet Take 1 tablet (10 mg total) by mouth every 6 (six) hours as needed (Nausea or vomiting). 04/19/20 05/18/20  Nicholas Lose, MD   No Known Allergies Review of Systems  Constitutional: Positive for fatigue. Negative for appetite change.   Respiratory: Positive for cough and shortness of breath.     Physical Exam Vitals and nursing note reviewed.  Constitutional:      General: She is not in acute distress.    Appearance: Normal appearance.  Cardiovascular:     Rate and Rhythm: Normal rate.  Pulmonary:     Comments: cough Neurological:     General: No focal deficit present.     Mental Status: She is alert and oriented to person, place, and time.  Psychiatric:        Mood and Affect: Mood normal.        Behavior: Behavior normal.     Vital Signs: BP (!) 127/56 (BP Location: Right Leg)   Pulse 92   Temp 98.4 F (36.9 C) (Oral)   Resp (!) 22   Ht 5' 2"  (1.575 m)   Wt 106.4 kg   SpO2 94%   BMI 42.90 kg/m  Pain Scale: 0-10   Pain Score: 0-No pain   SpO2: SpO2: 94 % O2 Device:SpO2: 94 % O2 Flow Rate: .O2 Flow Rate (L/min): 10 L/min  IO: Intake/output summary:   Intake/Output Summary (Last 24 hours) at 05/27/2020 1242 Last data filed at 05/27/2020 0500 Gross per 24 hour  Intake --  Output 1650 ml  Net -1650 ml    LBM: Last BM Date: 05/24/20 Baseline Weight: Weight: 104.6 kg Most recent  weight: Weight: 106.4 kg     Palliative Assessment/Data: PPS: 50%     Thank you for this consult. Palliative medicine will continue to follow and assist as needed.   Time In: 1042 Time Out: 1206 Time Total: 94 mins Greater than 50%  of this time was spent counseling and coordinating care related to the above assessment and plan.  Signed by: Mariana Kaufman, AGNP-C Palliative Medicine    Please contact Palliative Medicine Team phone at [phone] for questions and concerns.  For individual provider: See Shea Evans

## 2020-05-27 NOTE — Evaluation (Signed)
Physical Therapy Evaluation Patient Details Name: Deborah [name] MRN: [id number] DOB: 05-24-1956 Today's Date: 05/27/2020   History of Present Illness  Deborah [name] is a 64 y.o. female with medical history significant of left-sided breast cancer status post lumpectomy on adjuvant chemotherapy, left lower lobe lung cancer, CHF with preserved ejection fraction, COPD on chronic 4 L home O2, hypertension, OSA with pulmonary hypertension who presents with progressively worsening hypoxia and shortness of breath.  Patient states that she has had a couple weeks of slowly worsening shortness of breath and has noticed that in that time she would have desaturations on her home pulse ox after taking just short walks despite her home 4 L of oxygen.  This has progressed more rapidly in the past week and due to concern over falling as a consequent of hypoxia she stopped taking her Lasix on Friday to prevent her from having to get up and go to the bathroom.  Clinical Impression  Patient received in bed, husband, [name] present in room. Patient on 10 lpm via HFNC. Sats at rest in low 90%s. She is mod independent for bed mobility. With sitting up on side of bed saturations dropping into 80%s. With returning to supine saturations dropped into 70%s. Patient educated to take slow, deep breaths and returned to mid 90%s within a couple of minutes. Mobility limited at this time due to SOB and O2 saturation level with mobility. Patient will continue to benefit from skilled PT while here to improve activity tolerance and safety with mobility.       Follow Up Recommendations Home health PT;Supervision/Assistance - 24 hour    Equipment Recommendations  None recommended by PT    Recommendations for Other Services       Precautions / Restrictions Precautions Precautions: Fall Precaution Comments: mod fall Restrictions Weight Bearing Restrictions: No      Mobility  Bed Mobility Overal bed mobility: Modified  Independent             General bed mobility comments: use of bed rails    Transfers                 General transfer comment: not attempted this visit due to poor O2 saturations with bed mobility  Ambulation/Gait             General Gait Details: NT  Stairs            Wheelchair Mobility    Modified Rankin (Stroke Patients Only)       Balance Overall balance assessment: Needs assistance Sitting-balance support: Feet supported Sitting balance-Leahy Scale: Good                                       Pertinent Vitals/Pain Pain Assessment: No/denies pain    Home Living Family/patient expects to be discharged to:: Private residence   Available Help at Discharge: Family;Available PRN/intermittently Type of Home: House Home Access: Stairs to enter Entrance Stairs-Rails: Right Entrance Stairs-Number of Steps: 4-6 Home Layout: One level Home Equipment: Walker - 2 wheels;Walker - 4 wheels;Shower seat;Wheelchair - power;Wheelchair - manual      Prior Function           Comments: patient was not using AD prior to admission, but husband is nearby or with her to assist as needed     Hand Dominance        Extremity/Trunk Assessment  Upper Extremity Assessment Upper Extremity Assessment: Overall WFL for tasks assessed    Lower Extremity Assessment Lower Extremity Assessment: Overall WFL for tasks assessed    Cervical / Trunk Assessment Cervical / Trunk Assessment: Normal  Communication   Communication: No difficulties  Cognition Arousal/Alertness: Awake/alert Behavior During Therapy: WFL for tasks assessed/performed Overall Cognitive Status: Within Functional Limits for tasks assessed                                        General Comments      Exercises     Assessment/Plan    PT Assessment Patient needs continued PT services  PT Problem List Decreased strength;Decreased mobility;Decreased  activity tolerance;Cardiopulmonary status limiting activity       PT Treatment Interventions DME instruction;Therapeutic activities;Gait training;Therapeutic exercise;Patient/family education;Stair training;Balance training;Functional mobility training    PT Goals (Current goals can be found in the Care Plan section)  Acute Rehab PT Goals Patient Stated Goal: to go home, breathe better PT Goal Formulation: With patient/family Time For Goal Achievement: 06/10/20 Potential to Achieve Goals: Good    Frequency Min 3X/week   Barriers to discharge Inaccessible home environment at least 4 steps to enter home    Co-evaluation               AM-PAC PT "6 Clicks" Mobility  Outcome Measure Help needed turning from your back to your side while in a flat bed without using bedrails?: A Lot Help needed moving from lying on your back to sitting on the side of a flat bed without using bedrails?: A Little Help needed moving to and from a bed to a chair (including a wheelchair)?: A Little Help needed standing up from a chair using your arms (e.g., wheelchair or bedside chair)?: A Little Help needed to walk in hospital room?: A Lot Help needed climbing 3-5 steps with a railing? : A Lot 6 Click Score: 15    End of Session Equipment Utilized During Treatment: Oxygen Activity Tolerance: Other (comment);Treatment limited secondary to medical complications (Comment) (limited by O2 sats) Patient left: in bed;with call bell/phone within reach;with family/visitor present Nurse Communication: Mobility status;Other (comment) (patient requesting bath and bed changed. NT notified) PT Visit Diagnosis: Muscle weakness (generalized) (M62.81);Difficulty in walking, not elsewhere classified (R26.2)    Time: 1335-1350 PT Time Calculation (min) (ACUTE ONLY): 15 min   Charges:   PT Evaluation $PT Eval Moderate Complexity: 1 Mod          Aneudy Champlain, PT, GCS 05/27/20,2:11 PM

## 2020-05-27 NOTE — Progress Notes (Signed)
PROGRESS NOTE    Deborah [name]  IPJ:[id number] DOB: 1955-11-29 DOA: 05/24/2020 PCP: Pleas Koch, NP   Chief Complaint  Patient presents with  . Respiratory Distress   Brief Narrative: 64 year old female with significant history of left-sided breast cancer status post lumpectomy and adjuvant chemo, left lower lobe lung cancer, chronic tachycardia in 130s to 140s at some point, CHF with preserved EF, COPD/chronic hypoxic respiratory failure on 4 L home oxygen, hypertension, OSA/pulmonary hypertension presented with worsening hypoxia, shortness of breath, going on for couple of weeks and slowly worsening to the point that she gets hypoxic event after taking just short walks on home oxygen, he stopped taking her Lasix on Friday to prevent her from having to get up and go to the bathroom to prevent falling.  Her emesis saturating 70% at home per liter, was placed on CPAP In the ED tachycardia 130s to 140s tachypneic 20s to 30s, hypoxic, lab work showed leukocytosis, lactic acidosis, chest x-ray small to moderate left pleural effusion with associated lung collapse and diffuse density of the left lobe representing pneumonia, given gentle IV fluid bolus ceftriaxone erythromycin Solu-Medrol nebulizer, placed on BiPAP and admitted.  11.9 am weaned off from BIPAP to [place] HFNC 12l- s/p left s/p 520 mL of straw-colored fluid was removed, exudative. continued on Bedtime bipap. Weaning down o2.  Subjective:  Feeling better, husband at bedside asking for scheduled nebs.  On 10 l [place] now Wbc downtrending.  Assessment & Plan:  Acute on chronic respiratory failure with hypoxia, on 4 L [place] at baseline, 2/2 left exudative pleural effusion, left-sided pneumonia, copd w/ acute exacerbation, initially on BiPAP in ED: Multifactorial hypoxia also in the setting of poor tolerance to chemotherapy.Status post thoracentesis 11/9-pleural fluid Gram stain negative, no growth so far. Weaned down to high flow nasal  cannula, 10 L now. Continue ceftriaxone/azithromycin, iv steroids, bronchodilators-ADD duonebsONEB tid, PRN nebs, cont Lasix 40 mg. Margin on her lumpectomies are negative and will likely not receive chemo given poor tolerance in the first cycle. Cont PT OT, follow-up culture data, urine antigen, continue PT OT, palliative care evaluation. Pulmonary following  Severe sepsis POA/community-acquired pneumonia: Tachycardia tachypnea and leukocytosis improving. Culture returned no growth so far. Continue Ceftriaxone/Azithro.  Chronic diastolic CHF: Repeat echo shows EF 55 to 60% indeterminate diastolic filling, normal RV systolic function and size AND normal pulmonary artery systolic pressure. Not taking diuretics for 6 weeks  Or so but has not been eating Lot and reports weight loss of ~30 lb as in the same time frame. She is back on Lasix at 40 mg.BNP stable.  COPD/centrally lobular emphysema with acute exacerbation/Chronic restrictive lung disease:  Due to pneumonia. Significant wheezing on admission, improved with systemic steroid, bronchodilators. Pulmonary following.  Lactic acidosis due to respiratory distress/sepsis: resolved.  AKI creatinine peaked to 1.13, now at 0.6-0.7 tolerating Lasix.monitor.  Anemia likely in the setting of chronic disease/?  Etiology-ordered iron panel for a.m.  Morbid obesity with BMI 42.6 outpatient weight loss follow-up with PCP OSA noncompliant with CPAP, untreated: BiPAP bedtime.  Has not been using cpap at home for sometime, although has one  Primary malignant neoplasm of bronchus of left lower lobe, " I have been dealing with it"- she says "it is stage 3 and never will be cured and learning to live with it", sees Dr Julien Nordmann.   Malignant neoplasm of upper-outer quadrant of left breast in female, estrogen receptor negative triple negative status post lumpectomy . on adjuvant chemo with Adriamycin  and Cytoxan had recent chemotherapy with poor tolerance,  echocardiogram normal.   GOC: we discussed goals of care CODE STATUS she would like to stay full code.  Palliative care consult has been requested given her complex comorbidities, at risk of decompensation.   Nutrition: Diet Order            DIET SOFT Room service appropriate? No; Fluid consistency: Thin  Diet effective now                  Body mass index is 42.9 kg/m.   DVT prophylaxis: enoxaparin (LOVENOX) injection 40 mg Start: 05/24/20 2200 Code Status:   Code Status: Full Code  Family Communication: plan of care discussed with patient at bedside.  Status is: Inpatient  Remains inpatient appropriate because:IV treatments appropriate due to intensity of illness or inability to take PO and Inpatient level of care appropriate due to severity of illness   Dispo: The patient is from: Home w/ husband, usually ambulatory              Anticipated d/c is to: Home PT OT eval once oxygen requirement improves              Anticipated d/c date is: 2-3 days              Patient currently is medically stable to d/c.   Consultants:see note  Procedures:see note  Culture/Microbiology    Component Value Date/Time   SDES PLEURAL LEFT 05/25/2020 1236   SDES PLEURAL LEFT 05/25/2020 1236   [place]co 05/25/2020 1236   SPECREQUEST NONE 05/25/2020 1236   CULT  05/25/2020 1236    NO GROWTH < 24 HOURS Performed at [facility], [place] [address] [place], [place] [address]    REPTSTATUS PENDING 05/25/2020 1236   REPTSTATUS 05/25/2020 FINAL 05/25/2020 1236    Other culture-see note  Medications: Scheduled Meds: . aspirin  81 mg Oral Daily  . budesonide (PULMICORT) nebulizer solution  0.25 mg Nebulization BID  . Chlorhexidine Gluconate Cloth  6 each Topical Daily  . dextrose      . doxycycline  100 mg Oral Q12H  . enoxaparin (LOVENOX) injection  40 mg Subcutaneous Q24H  . furosemide  40 mg Oral Daily  . insulin aspart  0-9 Units Subcutaneous Q4H  . ipratropium  0.5 mg  Nebulization Once  . mouth rinse  15 mL Mouth Rinse BID  . methylPREDNISolone (SOLU-MEDROL) injection  40 mg Intravenous Q8H  . montelukast  10 mg Oral QHS  . sodium chloride flush  10-40 mL Intracatheter Q12H  . sodium chloride flush  3 mL Intravenous Q12H  . umeclidinium-vilanterol  1 puff Inhalation Daily   Continuous Infusions: . cefTRIAXone (ROCEPHIN)  IV 2 g (05/26/20 2006)    Antimicrobials: Anti-infectives (From admission, onward)   Start     Dose/Rate Route Frequency Ordered Stop   05/26/20 2200  doxycycline (VIBRA-TABS) tablet 100 mg        100 mg Oral Every 12 hours 05/26/20 1404 05/29/20 0959   05/25/20 2100  cefTRIAXone (ROCEPHIN) 2 g in sodium chloride 0.9 % 100 mL IVPB        2 g 200 mL/hr over 30 Minutes Intravenous Every 24 hours 05/24/20 2126 05/29/20 2059   05/24/20 2200  azithromycin (ZITHROMAX) 500 mg in sodium chloride 0.9 % 250 mL IVPB  Status:  Discontinued        500 mg 250 mL/hr over 60 Minutes Intravenous Every 24 hours 05/24/20  2126 05/26/20 1404   05/24/20 2015  cefTRIAXone (ROCEPHIN) 2 g in sodium chloride 0.9 % 100 mL IVPB  Status:  Discontinued        2 g 200 mL/hr over 30 Minutes Intravenous Every 24 hours 05/24/20 2004 05/24/20 2128   05/24/20 2015  azithromycin (ZITHROMAX) 500 mg in sodium chloride 0.9 % 250 mL IVPB  Status:  Discontinued        500 mg 250 mL/hr over 60 Minutes Intravenous Every 24 hours 05/24/20 2004 05/24/20 2128     Objective: Vitals: Today's Vitals   05/27/20 0300 05/27/20 0340 05/27/20 0411 05/27/20 0500  BP: 117/63     Pulse: 80 85    Resp: (!) 26 (!) 23    Temp: 98.3 F (36.8 C)     TempSrc: Oral     SpO2: 97% 97% 96%   Weight:    106.4 kg  Height:      PainSc:        Intake/Output Summary (Last 24 hours) at 05/27/2020 0722 Last data filed at 05/27/2020 0500 Gross per 24 hour  Intake --  Output 1650 ml  Net -1650 ml   Filed Weights   05/25/20 2237 05/26/20 0105 05/27/20 0500  Weight: 105.7 kg 105.7 kg  106.4 kg   Weight change: 1.8 kg  Intake/Output from previous day: 11/10 0701 - 11/11 0700 In: -  Out: 4481 [Urine:1650] Intake/Output this shift: No intake/output data recorded.  Examination: General exam: AAOx3, obese, NAD, weak appearing. HEENT:Oral mucosa moist, Ear/Nose WNL grossly, dentition normal. Respiratory system: bilaterally diminished breath sounds mild wheezing,,no use of accessory muscle Cardiovascular system: S1 & S2 +, No JVD,. Gastrointestinal system: Abdomen soft, NT,ND, BS+ Nervous System:Alert, awake, moving extremities and grossly nonfocal Extremities: No edema, distal peripheral pulses palpable.  Skin: No rashes,no icterus. MSK: Normal muscle bulk,tone, power  Data Reviewed: I have personally reviewed following labs and imaging studies CBC: Recent Labs  Lab 05/24/20 1847 05/24/20 2120 05/25/20 0254 05/26/20 0505 05/27/20 0155  WBC 17.1*  --  17.0* 16.8* 12.6*  NEUTROABS 16.4*  --   --   --   --   HGB 11.6* 12.6 10.4* 9.4* 9.4*  HCT 40.3 37.0 36.3 32.7* 31.7*  MCV 93.7  --  94.5 94.8 94.1  PLT 345  --  337 352 856   Basic Metabolic Panel: Recent Labs  Lab 05/24/20 1847 05/24/20 2120 05/25/20 0254 05/26/20 0505 05/27/20 0155  NA 137 139 139 142 144  K 4.0 3.3* 3.5 4.0 3.8  CL 89*  --  90* 95* 93*  CO2 34*  --  34* 39* 43*  GLUCOSE 149*  --  226* 133* 179*  BUN 16  --  20 27* 22  CREATININE 0.90  --  1.13* 0.69 0.70  CALCIUM 8.7*  --  8.5* 8.5* 8.4*   GFR: Estimated Creatinine Clearance: 83.6 mL/min (by C-G formula based on SCr of 0.7 mg/dL). Liver Function Tests: Recent Labs  Lab 05/25/20 0254  AST 26  ALT 26  ALKPHOS 86  BILITOT 0.6  PROT 6.4*  ALBUMIN 1.8*   No results for input(s): LIPASE, AMYLASE in the last 168 hours. No results for input(s): AMMONIA in the last 168 hours. Coagulation Profile: Recent Labs  Lab 05/24/20 2004  INR 1.3*   Cardiac Enzymes: No results for input(s): CKTOTAL, CKMB, CKMBINDEX, TROPONINI in  the last 168 hours. BNP (last 3 results) No results for input(s): PROBNP in the last 8760 hours. HbA1C: Recent Labs  05/26/20 0505  HGBA1C 6.0*   CBG: Recent Labs  Lab 05/26/20 1159 05/26/20 1535 05/26/20 1948 05/26/20 2331 05/27/20 0337  GLUCAP 136* 124* 154* 189* 142*   Lipid Profile: No results for input(s): CHOL, HDL, LDLCALC, TRIG, CHOLHDL, LDLDIRECT in the last 72 hours. Thyroid Function Tests: No results for input(s): TSH, T4TOTAL, FREET4, T3FREE, THYROIDAB in the last 72 hours. Anemia Panel: No results for input(s): VITAMINB12, FOLATE, FERRITIN, TIBC, IRON, RETICCTPCT in the last 72 hours. Sepsis Labs: Recent Labs  Lab 05/24/20 2004 05/24/20 2335 05/25/20 0254 05/25/20 0520  LATICACIDVEN 2.3* 5.2* 3.6* 1.0    Recent Results (from the past 240 hour(s))  Respiratory Panel by RT PCR (Flu A&B, Covid) - Nasopharyngeal Swab     Status: None   Collection Time: 05/24/20  6:47 PM   Specimen: Nasopharyngeal Swab  Result Value Ref Range Status   SARS Coronavirus 2 by RT PCR NEGATIVE NEGATIVE Final    Comment: (NOTE) SARS-CoV-2 target nucleic acids are NOT DETECTED.  The SARS-CoV-2 RNA is generally detectable in upper respiratoy specimens during the acute phase of infection. The lowest concentration of SARS-CoV-2 viral copies this assay can detect is 131 copies/mL. A negative result does not preclude SARS-Cov-2 infection and should not be used as the sole basis for treatment or other patient management decisions. A negative result may occur with  improper specimen collection/handling, submission of specimen other than nasopharyngeal swab, presence of viral mutation(s) within the areas targeted by this assay, and inadequate number of viral copies (<131 copies/mL). A negative result must be combined with clinical observations, patient history, and epidemiological information. The expected result is Negative.  Fact Sheet for Patients:   PinkCheek.be  Fact Sheet for Healthcare Providers:  GravelBags.it  This test is no t yet approved or cleared by the [place] FDA and  has been authorized for detection and/or diagnosis of SARS-CoV-2 by FDA under an Emergency Use Authorization (EUA). This EUA will remain  in effect (meaning this test can be used) for the duration of the COVID-19 declaration under Section 564(b)(1) of the Act, 21 U.S.C. section 360bbb-3(b)(1), unless the authorization is terminated or revoked sooner.     Influenza A by PCR NEGATIVE NEGATIVE Final   Influenza B by PCR NEGATIVE NEGATIVE Final    Comment: (NOTE) The Xpert Xpress SARS-CoV-2/FLU/RSV assay is intended as an aid in  the diagnosis of influenza from Nasopharyngeal swab specimens and  should not be used as a sole basis for treatment. Nasal washings and  aspirates are unacceptable for Xpert Xpress SARS-CoV-2/FLU/RSV  testing.  Fact Sheet for Patients: PinkCheek.be  Fact Sheet for Healthcare Providers: GravelBags.it  This test is not yet approved or cleared by the [place] FDA and  has been authorized for detection and/or diagnosis of SARS-CoV-2 by  FDA under an Emergency Use Authorization (EUA). This EUA will remain  in effect (meaning this test can be used) for the duration of the  Covid-19 declaration under Section 564(b)(1) of the Act, 21  U.S.C. section 360bbb-3(b)(1), unless the authorization is  terminated or revoked. Performed at [facility], [place] [address], [place], [place] [address]   Blood Culture (routine x 2)     Status: None (Preliminary result)   Collection Time: 05/24/20  8:40 PM   Specimen: BLOOD RIGHT ARM  Result Value Ref Range Status   Specimen Description BLOOD RIGHT ARM  Final   Special Requests   Final    BOTTLES DRAWN AEROBIC AND ANAEROBIC Blood  Culture adequate volume   Culture    Final    NO GROWTH 2 DAYS Performed at [facility], [place] [address], [place], [place] [address]    Report Status PENDING  Incomplete  Urine culture     Status: Abnormal   Collection Time: 05/24/20  8:50 PM   Specimen: Urine, Clean Catch  Result Value Ref Range Status   Specimen Description URINE, CLEAN CATCH  Final   Special Requests   Final    NONE Performed at [facility], [place] [address], [place], [place] [address]    Culture MULTIPLE SPECIES PRESENT, SUGGEST RECOLLECTION (A)  Final   Report Status 05/26/2020 FINAL  Final  Blood Culture (routine x 2)     Status: None (Preliminary result)   Collection Time: 05/24/20  9:06 PM   Specimen: BLOOD RIGHT HAND  Result Value Ref Range Status   Specimen Description BLOOD RIGHT HAND  Final   Special Requests   Final    BOTTLES DRAWN AEROBIC AND ANAEROBIC Blood Culture results may not be optimal due to an inadequate volume of blood received in culture bottles   Culture   Final    NO GROWTH 2 DAYS Performed at [facility], [place] [address], [place], [place] [address]    Report Status PENDING  Incomplete  Culture, body fluid-bottle     Status: None (Preliminary result)   Collection Time: 05/25/20 12:36 PM   Specimen: Pleura  Result Value Ref Range Status   Specimen Description PLEURAL LEFT  Final   Special Requests NONE  Final   Culture   Final    NO GROWTH < 24 HOURS Performed at [facility], [place] [address] [place], [place] [address]    Report Status PENDING  Incomplete  Gram stain     Status: None   Collection Time: 05/25/20 12:36 PM   Specimen: Pleura  Result Value Ref Range Status   Specimen Description PLEURAL LEFT  Final   Special Requests NONE  Final   Gram Stain   Final    WBC PRESENT,BOTH PMN AND MONONUCLEAR RED BLOOD CELLS PRESENT NO ORGANISMS SEEN CYTOSPIN SMEAR Performed at [facility], [address], [place], [place] [address]    Report Status 05/25/2020 FINAL  Final      Radiology Studies: DG Chest Port 1 View  Result Date: 05/26/2020 CLINICAL DATA:  Pneumonia EXAM: PORTABLE CHEST 1 VIEW COMPARISON:  Yesterday FINDINGS: Unchanged extensive opacification of the left chest with volume loss. Relatively clear right lung although there are bronchograms at the medial right base. Stable heart size accentuated by mediastinal fat based on most recent CT. Porta catheter on the right with tip at the upper right atrium. IMPRESSION: Stable left more than right infiltrates. Electronically Signed   By: Monte Fantasia M.D.   On: 05/26/2020 07:57   DG Chest Port 1 View  Result Date: 05/25/2020 CLINICAL DATA:  Status post thoracentesis EXAM: PORTABLE CHEST 1 VIEW COMPARISON:  05/24/2020 FINDINGS: Cardiac shadow is stable. Aortic calcifications are again seen. Slight reduction in left pleural effusion is noted without evidence of pneumothorax. Patchy infiltrate remains in the left lung. Right chest wall port is again seen. IMPRESSION: No pneumothorax following thoracentesis. Persistent left-sided infiltrate is noted. Electronically Signed   By: Inez Catalina M.D.   On: 05/25/2020 13:20   ECHOCARDIOGRAM COMPLETE  Result Date: 05/25/2020    ECHOCARDIOGRAM REPORT   Patient Name:   Deborah [name]  Date of Exam: 05/25/2020 Medical Rec #:  [id number]        Height:       62.0 in Accession #:    [id number]       Weight:       238.1 lb Date of Birth:  1956-03-10       BSA:          2.059 m Patient Age:    61 years         BP:           108/61 mmHg Patient Gender: F                HR:           119 bpm. Exam Location:  Inpatient Procedure: 2D Echo Indications:    Dyspnea R06.00  History:        Patient has prior history of Echocardiogram examinations, most                 recent 01/27/2020. CHF, COPD; Risk Factors:Hypertension and                 Former Smoker.  Sonographer:    Mikki Santee RDCS (AE) Referring Phys: [id number] [place]  1. Left ventricular ejection  fraction, by estimation, is 55 to 60%. The left ventricle has normal function. The left ventricle has no regional wall motion abnormalities. Indeterminate diastolic filling due to E-A fusion.  2. Right ventricular systolic function is normal. The right ventricular size is normal. There is normal pulmonary artery systolic pressure. The estimated right ventricular systolic pressure is 20.9 mmHg.  3. The mitral valve is normal in structure. No evidence of mitral valve regurgitation. No evidence of mitral stenosis.  4. The aortic valve is normal in structure. Aortic valve regurgitation is not visualized. No aortic stenosis is present.  5. The inferior vena cava is normal in size with greater than 50% respiratory variability, suggesting right atrial pressure of 3 mmHg. Comparison(s): No significant change from prior study. Prior images reviewed side by side. FINDINGS  Left Ventricle: Left ventricular ejection fraction, by estimation, is 55 to 60%. The left ventricle has normal function. The left ventricle has no regional wall motion abnormalities. The left ventricular internal cavity size was normal in size. There is  no left ventricular hypertrophy. Indeterminate diastolic filling due to E-A fusion. (better information may be obtained when heart rate is normal). Right Ventricle: The right ventricular size is normal. No increase in right ventricular wall thickness. Right ventricular systolic function is normal. There is normal pulmonary artery systolic pressure. The tricuspid regurgitant velocity is 2.83 m/s, and  with an assumed right atrial pressure of 3 mmHg, the estimated right ventricular systolic pressure is 47.0 mmHg. Left Atrium: Left atrial size was normal in size. Right Atrium: Right atrial size was normal in size. Pericardium: There is no evidence of pericardial effusion. Mitral Valve: The mitral valve is normal in structure. No evidence of mitral valve regurgitation. No evidence of mitral valve stenosis.  Tricuspid Valve: The tricuspid valve is normal in structure. Tricuspid valve regurgitation is mild . No evidence of tricuspid stenosis. Aortic Valve: The aortic valve is normal in structure. Aortic valve regurgitation is not visualized. No aortic stenosis is present. Pulmonic Valve: The pulmonic valve was normal in structure. Pulmonic valve regurgitation is not visualized. No evidence of pulmonic stenosis. Aorta: The aortic root is normal in size and structure. Venous: The inferior vena cava is normal in  size with greater than 50% respiratory variability, suggesting right atrial pressure of 3 mmHg. IAS/Shunts: No atrial level shunt detected by color flow Doppler.  LEFT VENTRICLE PLAX 2D LVIDd:         4.20 cm LVIDs:         2.70 cm LV PW:         1.00 cm LV IVS:        1.00 cm LVOT diam:     2.10 cm LV SV:         57 LV SV Index:   28 LVOT Area:     3.46 cm  RIGHT VENTRICLE RV S prime:     19.70 cm/s TAPSE (M-mode): 1.6 cm LEFT ATRIUM           Index       RIGHT ATRIUM           Index LA diam:      2.90 cm 1.41 cm/m  RA Area:     14.00 cm LA Vol (A4C): 47.0 ml 22.83 ml/m RA Volume:   31.80 ml  15.45 ml/m  AORTIC VALVE LVOT Vmax:   105.00 cm/s LVOT Vmean:  74.300 cm/s LVOT VTI:    0.166 m  AORTA Ao Root diam: 2.70 cm TRICUSPID VALVE TR Peak grad:   32.0 mmHg TR Vmax:        283.00 cm/s  SHUNTS Systemic VTI:  0.17 m Systemic Diam: 2.10 cm Dani Gobble Croitoru MD Electronically signed by Sanda Klein MD Signature Date/Time: 05/25/2020/3:58:39 PM    Final    IR THORACENTESIS ASP PLEURAL SPACE W/IMG GUIDE  Result Date: 05/25/2020 INDICATION: Patient with history of lung cancer, breast cancer with emphysema and chronic hypoxic respiratory failure found to have a left-sided pleural effusion. Request is for therapeutic and diagnostic left-sided thoracentesis EXAM: ULTRASOUND GUIDED THERAPEUTIC AND DIAGNOSTIC THORACENTESIS MEDICATIONS: LIDOCAINE 1% 10 ML COMPLICATIONS: None immediate. PROCEDURE: An ultrasound guided  thoracentesis was thoroughly discussed with the patient and questions answered. The benefits, risks, alternatives and complications were also discussed. The patient understands and wishes to proceed with the procedure. Written consent was obtained. Ultrasound was performed to localize and mark an adequate pocket of fluid in the left chest. The area was then prepped and draped in the normal sterile fashion. 1% Lidocaine was used for local anesthesia. Under ultrasound guidance a 6 Fr Safe-T-Centesis catheter was introduced. Thoracentesis was performed. The catheter was removed and a dressing applied. The patient unable to maintain position and the procedure was aborted. FINDINGS: A total of approximately 520 mL of straw-colored fluid was removed. Samples were sent to the laboratory as requested by the clinical team. IMPRESSION: Successful ultrasound guided therapeutic and diagnostic left-sided thoracentesis yielding 520 mL of pleural fluid. Read by: Rushie Nyhan, NP Electronically Signed   By: Jacqulynn Cadet M.D.   On: 05/25/2020 13:42     LOS: 3 days   Antonieta Pert, MD [facility]  05/27/2020, 7:22 AM

## 2020-05-28 ENCOUNTER — Ambulatory Visit (HOSPITAL_COMMUNITY): Payer: BLUE CROSS/BLUE SHIELD

## 2020-05-28 ENCOUNTER — Inpatient Hospital Stay: Payer: BLUE CROSS/BLUE SHIELD

## 2020-05-28 LAB — CBC
HCT: 33.7 % — ABNORMAL LOW (ref 36.0–46.0)
Hemoglobin: 9.8 g/dL — ABNORMAL LOW (ref 12.0–15.0)
MCH: 27 pg (ref 26.0–34.0)
MCHC: 29.1 g/dL — ABNORMAL LOW (ref 30.0–36.0)
MCV: 92.8 fL (ref 80.0–100.0)
Platelets: 390 10*3/uL (ref 150–400)
RBC: 3.63 MIL/uL — ABNORMAL LOW (ref 3.87–5.11)
RDW: 15 % (ref 11.5–15.5)
WBC: 12.4 10*3/uL — ABNORMAL HIGH (ref 4.0–10.5)
nRBC: 0.6 % — ABNORMAL HIGH (ref 0.0–0.2)

## 2020-05-28 LAB — IRON AND TIBC
Iron: 49 ug/dL (ref 28–170)
Saturation Ratios: 24 % (ref 10.4–31.8)
TIBC: 204 ug/dL — ABNORMAL LOW (ref 250–450)
UIBC: 155 ug/dL

## 2020-05-28 LAB — RETICULOCYTES
Immature Retic Fract: 25.5 % — ABNORMAL HIGH (ref 2.3–15.9)
RBC.: 3.57 MIL/uL — ABNORMAL LOW (ref 3.87–5.11)
Retic Count, Absolute: 67.1 10*3/uL (ref 19.0–186.0)
Retic Ct Pct: 1.9 % (ref 0.4–3.1)

## 2020-05-28 LAB — FERRITIN: Ferritin: 217 ng/mL (ref 11–307)

## 2020-05-28 LAB — BASIC METABOLIC PANEL
Anion gap: 10 (ref 5–15)
BUN: 17 mg/dL (ref 8–23)
CO2: 41 mmol/L — ABNORMAL HIGH (ref 22–32)
Calcium: 8.5 mg/dL — ABNORMAL LOW (ref 8.9–10.3)
Chloride: 92 mmol/L — ABNORMAL LOW (ref 98–111)
Creatinine, Ser: 0.72 mg/dL (ref 0.44–1.00)
GFR, Estimated: >60 mL/min (ref >60)
Glucose, Bld: 249 mg/dL — ABNORMAL HIGH (ref 70–99)
Potassium: 4.1 mmol/L (ref 3.5–5.1)
Sodium: 143 mmol/L (ref 135–145)

## 2020-05-28 LAB — GLUCOSE, CAPILLARY: Glucose-Capillary: 123 mg/dL — ABNORMAL HIGH (ref 70–99)

## 2020-05-28 LAB — CYTOLOGY - NON PAP

## 2020-05-28 LAB — FOLATE: Folate: 4.5 ng/mL — ABNORMAL LOW (ref >5.9)

## 2020-05-28 LAB — VITAMIN B12: Vitamin B-12: 1367 pg/mL — ABNORMAL HIGH (ref 180–914)

## 2020-05-28 NOTE — Progress Notes (Addendum)
This chaplain responded to PMT consult for spiritual care.  The Pt. husband-Michael is bedside. The Pt. is awake and declined visit at this time. The Pt. agrees to chaplain returning later in the day.

## 2020-05-28 NOTE — Progress Notes (Signed)
PROGRESS NOTE    Deborah [name]  YSA:[id number] DOB: 03/11/1956 DOA: 05/24/2020 PCP: Pleas Koch, NP   Chief Complaint  Patient presents with  . Respiratory Distress   Brief Narrative: 64 year old female with significant history of left-sided breast cancer status post lumpectomy and adjuvant chemo, left lower lobe lung cancer, chronic tachycardia in 130s to 140s at some point, CHF with preserved EF, COPD/chronic hypoxic respiratory failure on 4 L home oxygen, hypertension, OSA/pulmonary hypertension presented with worsening hypoxia, shortness of breath, going on for couple of weeks and slowly worsening to the point that she gets hypoxic event after taking just short walks on home oxygen, he stopped taking her Lasix on Friday to prevent her from having to get up and go to the bathroom to prevent falling.  Her emesis saturating 70% at home per liter, was placed on CPAP In the ED tachycardia 130s to 140s tachypneic 20s to 30s, hypoxic, lab work showed leukocytosis, lactic acidosis, chest x-ray small to moderate left pleural effusion with associated lung collapse and diffuse density of the left lobe representing pneumonia, given gentle IV fluid bolus ceftriaxone erythromycin Solu-Medrol nebulizer, placed on BiPAP and admitted.  11.9 am weaned off from BIPAP to [place] HFNC 12l- s/p left s/p 520 mL of straw-colored fluid was removed, exudative. continued on Bedtime bipap. Weaning down o2.  05/28/2020: Patient seen alongside patient's husband. Patient tells me that she is slowly improving. Patient is on 10 L/min of supplemental oxygen via nasal cannula. No new complaints.  Subjective: -No new complaints. -Patient continues to cough up clear phlegm's.  Assessment & Plan:  Acute on chronic respiratory failure with hypoxia, on 4 L [place] at baseline, 2/2 left exudative pleural effusion, left-sided pneumonia, copd w/ acute exacerbation, initially on BiPAP in ED: Multifactorial hypoxia also in the  setting of poor tolerance to chemotherapy.Status post thoracentesis 11/9-pleural fluid Gram stain negative, no growth so far. Weaned down to high flow nasal cannula, 10 L now. Continue ceftriaxone/azithromycin, iv steroids, bronchodilators-ADD duonebsONEB tid, PRN nebs, cont Lasix 40 mg. Margin on her lumpectomies are negative and will likely not receive chemo given poor tolerance in the first cycle. Cont PT OT, follow-up culture data, urine antigen, continue PT OT, palliative care evaluation. Pulmonary following 05/28/2020: Patient is currently on 10 L of supplemental oxygen via nasal cannula.  Severe sepsis POA/community-acquired pneumonia: Tachycardia tachypnea and leukocytosis improving. Culture returned no growth so far. Continue Ceftriaxone/Azithro. 05/28/2020: Patient is currently on ceftriaxone and doxycycline. Complete course of antibiotics.  Chronic diastolic CHF: Repeat echo shows EF 55 to 60% indeterminate diastolic filling, normal RV systolic function and size AND normal pulmonary artery systolic pressure. Not taking diuretics for 6 weeks  Or so but has not been eating Lot and reports weight loss of ~30 lb as in the same time frame. She is back on Lasix at 40 mg.  BNP stable.  COPD/centrally lobular emphysema with acute exacerbation/Chronic restrictive lung disease:  Due to pneumonia. Significant wheezing on admission, improved with systemic steroid, bronchodilators. Pulmonary following.  Lactic acidosis due to respiratory distress/sepsis: resolved.  AKI: -Creatinine peaked to 1.13, now at 0.6-0.7 tolerating Lasix.monitor. 05/28/2020: AKI has resolved. Serum creatinine today is 0.72.  Anemia likely in the setting of chronic disease/?  Etiology-ordered iron panel for a.m.  Morbid obesity with BMI 42.6 outpatient weight loss follow-up with PCP OSA noncompliant with CPAP, untreated: BiPAP bedtime.  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   Malignant neoplasm of upper-outer quadrant of left breast in female, estrogen receptor negative triple negative status post lumpectomy . on adjuvant chemo with Adriamycin and Cytoxan had recent chemotherapy with poor tolerance, echocardiogram normal.   GOC: Patient remains full code. Palliative care input is appreciated.   Nutrition: Diet Order            DIET SOFT Room service appropriate? No; Fluid consistency: Thin  Diet effective now                  Body mass index is 42.9 kg/m.   DVT prophylaxis: enoxaparin (LOVENOX) injection 40 mg Start: 05/24/20 2200 Code Status:   Code Status: Full Code  Family Communication: plan of care discussed with patient at bedside.  Status is: Inpatient  Remains inpatient appropriate because:IV treatments appropriate due to intensity of illness or inability to take PO and Inpatient level of care appropriate due to severity of illness   Dispo: The patient is from: Home w/ husband, usually ambulatory              Anticipated d/c is to: Home PT OT eval once oxygen requirement improves              Anticipated d/c date is: 2-3 days              Patient currently is medically stable to d/c.   Consultants:see note  Procedures:see note  Culture/Microbiology    Component Value Date/Time   SDES PLEURAL LEFT 05/25/2020 1236   SDES PLEURAL LEFT 05/25/2020 1236   [place] 05/25/2020 1236   SPECREQUEST NONE 05/25/2020 1236   CULT  05/25/2020 1236    NO GROWTH 3 DAYS Performed at [facility], [place] [address], [place], [place] [address]    REPTSTATUS PENDING 05/25/2020 1236   REPTSTATUS 05/25/2020 FINAL 05/25/2020 1236    Other culture-see note  Medications: Scheduled Meds: . aspirin  81 mg Oral Daily  . budesonide (PULMICORT) nebulizer solution  0.25 mg  Nebulization BID  . Chlorhexidine Gluconate Cloth  6 each Topical Daily  . doxycycline  100 mg Oral Q12H  . enoxaparin (LOVENOX) injection  40 mg Subcutaneous Q24H  . furosemide  40 mg Oral Daily  . ipratropium  0.5 mg Nebulization Once  . ipratropium-albuterol  3 mL Nebulization TID  . mouth rinse  15 mL Mouth Rinse BID  . methylPREDNISolone (SOLU-MEDROL) injection  40 mg Intravenous Q8H  . montelukast  10 mg Oral QHS  . sodium chloride flush  10-40 mL Intracatheter Q12H  . sodium chloride flush  3 mL Intravenous Q12H  . umeclidinium-vilanterol  1 puff Inhalation Daily   Continuous Infusions: . cefTRIAXone (ROCEPHIN)  IV 2 g (05/27/20 2111)    Antimicrobials: Anti-infectives (From admission, onward)   Start     Dose/Rate Route Frequency Ordered Stop   05/26/20 2200  doxycycline (VIBRA-TABS) tablet 100 mg        100 mg Oral Every 12 hours 05/26/20 1404 05/29/20 0959   05/25/20 2100  cefTRIAXone (ROCEPHIN) 2 g in sodium chloride 0.9 % 100 mL IVPB        2 g 200 mL/hr over 30 Minutes Intravenous Every 24 hours 05/24/20 2126 05/29/20 2059   05/24/20 2200  azithromycin (ZITHROMAX) 500 mg in sodium chloride  0.9 % 250 mL IVPB  Status:  Discontinued        500 mg 250 mL/hr over 60 Minutes Intravenous Every 24 hours 05/24/20 2126 05/26/20 1404   05/24/20 2015  cefTRIAXone (ROCEPHIN) 2 g in sodium chloride 0.9 % 100 mL IVPB  Status:  Discontinued        2 g 200 mL/hr over 30 Minutes Intravenous Every 24 hours 05/24/20 2004 05/24/20 2128   05/24/20 2015  azithromycin (ZITHROMAX) 500 mg in sodium chloride 0.9 % 250 mL IVPB  Status:  Discontinued        500 mg 250 mL/hr over 60 Minutes Intravenous Every 24 hours 05/24/20 2004 05/24/20 2128     Objective: Vitals: Today's Vitals   05/28/20 0825 05/28/20 0926 05/28/20 0932 05/28/20 1000  BP:      Pulse:  (!) 111  89  Resp:  20  20  Temp:      TempSrc:      SpO2: 95% 95% 95% 94%  Weight:      Height:      PainSc:       No intake or  output data in the 24 hours ending 05/28/20 1255 Filed Weights   05/25/20 2237 05/26/20 0105 05/27/20 0500  Weight: 105.7 kg 105.7 kg 106.4 kg   Weight change:   Intake/Output from previous day: No intake/output data recorded. Intake/Output this shift: No intake/output data recorded.  Examination: General exam: Patient is obese. Patient is denying any distress. Patient is awake, alert and oriented to time, place and person.  HEENT:Oral mucosa moist, Ear/Nose WNL grossly, dentition normal. Respiratory system: Severely decreased air entry, with inspiratory and expiratory wheeze.  Cardiovascular system: S1 & S2. Gastrointestinal system: Abdomen is obese, soft and nontender. Organs are difficult to assess. Nervous System:  Alert, awake, moving extremities and grossly nonfocal Extremities: Fullness of the ankle.  Data Reviewed: I have personally reviewed following labs and imaging studies CBC: Recent Labs  Lab 05/24/20 1847 05/24/20 1847 05/24/20 2120 05/25/20 0254 05/26/20 0505 05/27/20 0155 05/28/20 0230  WBC 17.1*  --   --  17.0* 16.8* 12.6* 12.4*  NEUTROABS 16.4*  --   --   --   --   --   --   HGB 11.6*   < > 12.6 10.4* 9.4* 9.4* 9.8*  HCT 40.3   < > 37.0 36.3 32.7* 31.7* 33.7*  MCV 93.7  --   --  94.5 94.8 94.1 92.8  PLT 345  --   --  337 352 370 390   < > = values in this interval not displayed.   Basic Metabolic Panel: Recent Labs  Lab 05/24/20 1847 05/24/20 1847 05/24/20 2120 05/25/20 0254 05/26/20 0505 05/27/20 0155 05/28/20 0230  NA 137   < > 139 139 142 144 143  K 4.0   < > 3.3* 3.5 4.0 3.8 4.1  CL 89*  --   --  90* 95* 93* 92*  CO2 34*  --   --  34* 39* 43* 41*  GLUCOSE 149*  --   --  226* 133* 179* 249*  BUN 16  --   --  20 27* 22 17  CREATININE 0.90  --   --  1.13* 0.69 0.70 0.72  CALCIUM 8.7*  --   --  8.5* 8.5* 8.4* 8.5*   < > = values in this interval not displayed.   GFR: Estimated Creatinine Clearance: 83.6 mL/min (by C-G formula based on SCr  of 0.72  mg/dL). Liver Function Tests: Recent Labs  Lab 05/25/20 0254  AST 26  ALT 26  ALKPHOS 86  BILITOT 0.6  PROT 6.4*  ALBUMIN 1.8*   No results for input(s): LIPASE, AMYLASE in the last 168 hours. No results for input(s): AMMONIA in the last 168 hours. Coagulation Profile: Recent Labs  Lab 05/24/20 2004  INR 1.3*   Cardiac Enzymes: No results for input(s): CKTOTAL, CKMB, CKMBINDEX, TROPONINI in the last 168 hours. BNP (last 3 results) No results for input(s): PROBNP in the last 8760 hours. HbA1C: Recent Labs    05/26/20 0505  HGBA1C 6.0*   CBG: Recent Labs  Lab 05/26/20 1948 05/26/20 2331 05/27/20 0337 05/27/20 0743 05/28/20 0748  GLUCAP 154* 189* 142* 141* 123*   Lipid Profile: No results for input(s): CHOL, HDL, LDLCALC, TRIG, CHOLHDL, LDLDIRECT in the last 72 hours. Thyroid Function Tests: No results for input(s): TSH, T4TOTAL, FREET4, T3FREE, THYROIDAB in the last 72 hours. Anemia Panel: Recent Labs    05/28/20 0230 05/28/20 0250  VITAMINB12 1,367*  --   FOLATE  --  4.5*  FERRITIN 217  --   TIBC 204*  --   IRON 49  --   RETICCTPCT  --  1.9   Sepsis Labs: Recent Labs  Lab 05/24/20 2004 05/24/20 2335 05/25/20 0254 05/25/20 0520  LATICACIDVEN 2.3* 5.2* 3.6* 1.0    Recent Results (from the past 240 hour(s))  Respiratory Panel by RT PCR (Flu A&B, Covid) - Nasopharyngeal Swab     Status: None   Collection Time: 05/24/20  6:47 PM   Specimen: Nasopharyngeal Swab  Result Value Ref Range Status   SARS Coronavirus 2 by RT PCR NEGATIVE NEGATIVE Final    Comment: (NOTE) SARS-CoV-2 target nucleic acids are NOT DETECTED.  The SARS-CoV-2 RNA is generally detectable in upper respiratoy specimens during the acute phase of infection. The lowest concentration of SARS-CoV-2 viral copies this assay can detect is 131 copies/mL. A negative result does not preclude SARS-Cov-2 infection and should not be used as the sole basis for treatment or other  patient management decisions. A negative result may occur with  improper specimen collection/handling, submission of specimen other than nasopharyngeal swab, presence of viral mutation(s) within the areas targeted by this assay, and inadequate number of viral copies (<131 copies/mL). A negative result must be combined with clinical observations, patient history, and epidemiological information. The expected result is Negative.  Fact Sheet for Patients:  PinkCheek.be  Fact Sheet for Healthcare Providers:  GravelBags.it  This test is no t yet approved or cleared by the [place] FDA and  has been authorized for detection and/or diagnosis of SARS-CoV-2 by FDA under an Emergency Use Authorization (EUA). This EUA will remain  in effect (meaning this test can be used) for the duration of the COVID-19 declaration under Section 564(b)(1) of the Act, 21 U.S.C. section 360bbb-3(b)(1), unless the authorization is terminated or revoked sooner.     Influenza A by PCR NEGATIVE NEGATIVE Final   Influenza B by PCR NEGATIVE NEGATIVE Final    Comment: (NOTE) The Xpert Xpress SARS-CoV-2/FLU/RSV assay is intended as an aid in  the diagnosis of influenza from Nasopharyngeal swab specimens and  should not be used as a sole basis for treatment. Nasal washings and  aspirates are unacceptable for Xpert Xpress SARS-CoV-2/FLU/RSV  testing.  Fact Sheet for Patients: PinkCheek.be  Fact Sheet for Healthcare Providers: GravelBags.it  This test is not yet approved or cleared by the [place] and  has been authorized for detection and/or diagnosis of SARS-CoV-2 by  FDA under an Emergency Use Authorization (EUA). This EUA will remain  in effect (meaning this test can be used) for the duration of the  Covid-19 declaration under Section 564(b)(1) of the Act, 21  U.S.C. section  360bbb-3(b)(1), unless the authorization is  terminated or revoked. Performed at [facility], [place] [address], [place], [place] [address]   Blood Culture (routine x 2)     Status: None (Preliminary result)   Collection Time: 05/24/20  8:40 PM   Specimen: BLOOD RIGHT ARM  Result Value Ref Range Status   Specimen Description BLOOD RIGHT ARM  Final   Special Requests   Final    BOTTLES DRAWN AEROBIC AND ANAEROBIC Blood Culture adequate volume   Culture   Final    NO GROWTH 4 DAYS Performed at [facility], [place] [address], [place], [place] [address]    Report Status PENDING  Incomplete  Urine culture     Status: Abnormal   Collection Time: 05/24/20  8:50 PM   Specimen: Urine, Clean Catch  Result Value Ref Range Status   Specimen Description URINE, CLEAN CATCH  Final   Special Requests   Final    NONE Performed at [facility], [place] [address], [place], [place] [address]    Culture MULTIPLE SPECIES PRESENT, SUGGEST RECOLLECTION (A)  Final   Report Status 05/26/2020 FINAL  Final  Blood Culture (routine x 2)     Status: None (Preliminary result)   Collection Time: 05/24/20  9:06 PM   Specimen: BLOOD RIGHT HAND  Result Value Ref Range Status   Specimen Description BLOOD RIGHT HAND  Final   Special Requests   Final    BOTTLES DRAWN AEROBIC AND ANAEROBIC Blood Culture results may not be optimal due to an inadequate volume of blood received in culture bottles   Culture   Final    NO GROWTH 4 DAYS Performed at [facility], [place] [address], [place], [place] [address]    Report Status PENDING  Incomplete  Culture, body fluid-bottle     Status: None (Preliminary result)   Collection Time: 05/25/20 12:36 PM   Specimen: Pleura  Result Value Ref Range Status   Specimen Description PLEURAL LEFT  Final   Special Requests NONE  Final   Culture   Final    NO GROWTH 3 DAYS Performed at [facility], [address], [place], [place] [address]    Report Status  PENDING  Incomplete  Gram stain     Status: None   Collection Time: 05/25/20 12:36 PM   Specimen: Pleura  Result Value Ref Range Status   Specimen Description PLEURAL LEFT  Final   Special Requests NONE  Final   Gram Stain   Final    WBC PRESENT,BOTH PMN AND MONONUCLEAR RED BLOOD CELLS PRESENT NO ORGANISMS SEEN CYTOSPIN SMEAR Performed at [facility], [address], [place], [place] [address]    Report Status 05/25/2020 FINAL  Final     Radiology Studies: No results found.   LOS: 4 days   Bonnell Public, MD [facility]  05/28/2020, 12:55 PM

## 2020-05-28 NOTE — Progress Notes (Signed)
This chaplain was present for F/U spiritual care with the Pt. The chaplain learned the Pt. sister died on June 23, 2023.  The chaplain practiced reflective listening and created a space for the Pt. to grieve as she shared the story of two family deaths on her birthday.   The chaplain understands the Pt. is hopeful she will be strong enough to return home and walk to the bathroom. The Pt. affirms her husband partnership in her illness and depends deeply on God's love.   The Pt. husband-Michael has returned to the Pt. room and accepts prayer and F/U spiritual care with the Pt.

## 2020-05-28 NOTE — Plan of Care (Signed)
Weaning O2 as ordered. Productive  cough noted. Remains on 9LHF at this time. Safety precautions maintained.

## 2020-05-29 LAB — CULTURE, BLOOD (ROUTINE X 2)
Culture: NO GROWTH
Culture: NO GROWTH
Special Requests: ADEQUATE

## 2020-05-29 NOTE — Progress Notes (Signed)
PROGRESS NOTE    Deborah [name]  TOI:[id number] DOB: [birth year] DOA: 05/24/2020 PCP: Pleas Koch, NP   Chief Complaint  Patient presents with  . Respiratory Distress   Brief Narrative: 64 year old female with significant history of left-sided breast cancer status post lumpectomy and adjuvant chemo, left lower lobe lung cancer, chronic tachycardia in 130s to 140s at some point, CHF with preserved EF, COPD/chronic hypoxic respiratory failure on 4 L home oxygen, hypertension, OSA/pulmonary hypertension presented with worsening hypoxia, shortness of breath, going on for couple of weeks and slowly worsening to the point that she gets hypoxic event after taking just short walks on home oxygen, he stopped taking her Lasix on Friday to prevent her from having to get up and go to the bathroom to prevent falling.  Her emesis saturating 70% at home per liter, was placed on CPAP In the ED tachycardia 130s to 140s tachypneic 20s to 30s, hypoxic, lab work showed leukocytosis, lactic acidosis, chest x-ray small to moderate left pleural effusion with associated lung collapse and diffuse density of the left lobe representing pneumonia, given gentle IV fluid bolus ceftriaxone erythromycin Solu-Medrol nebulizer, placed on BiPAP and admitted.  11.9 am weaned off from BIPAP to [place] HFNC 12l- s/p left s/p 520 mL of straw-colored fluid was removed, exudative. continued on Bedtime bipap. Weaning down o2.  05/28/2020: Patient seen alongside patient's husband. Patient tells me that she is slowly improving. Patient is on 10 L/min of supplemental oxygen via nasal cannula. No new complaints.  05/29/2020: Patient is slowly improving.  Supplemental oxygen requirement has gone down from 10 L/min liters per minute.  Subjective: -No new complaints. -Patient continues to cough up clear phlegm's.  Assessment & Plan:  Acute on chronic respiratory failure with hypoxia, on 4 L [place] at baseline, 2/2 left exudative pleural  effusion, left-sided pneumonia, copd w/ acute exacerbation, initially on BiPAP in ED: Multifactorial hypoxia also in the setting of poor tolerance to chemotherapy.Status post thoracentesis 11/9-pleural fluid Gram stain negative, no growth so far. Weaned down to high flow nasal cannula, 10 L now. Continue ceftriaxone/azithromycin, iv steroids, bronchodilators-ADD duonebsONEB tid, PRN nebs, cont Lasix 40 mg. Margin on her lumpectomies are negative and will likely not receive chemo given poor tolerance in the first cycle. Cont PT OT, follow-up culture data, urine antigen, continue PT OT, palliative care evaluation. Pulmonary following 05/28/2020: Patient is currently on 10 L of supplemental oxygen via nasal cannula. 05/29/2020: Patient is slowly improving.  Oxygen requirement has gone down from 10 L/min to 8 L/min.  Severe sepsis POA/community-acquired pneumonia: Tachycardia tachypnea and leukocytosis improving. Culture returned no growth so far. Continue Ceftriaxone/Azithro. 05/29/2020: Patient is currently on ceftriaxone and doxycycline. Complete course of antibiotics.  Chronic diastolic CHF: Repeat echo shows EF 55 to 60% indeterminate diastolic filling, normal RV systolic function and size AND normal pulmonary artery systolic pressure. Not taking diuretics for 6 weeks  Or so but has not been eating Lot and reports weight loss of ~30 lb as in the same time frame. She is back on Lasix at 40 mg.  BNP stable.  COPD/centrally lobular emphysema with acute exacerbation/Chronic restrictive lung disease:  Due to pneumonia. Significant wheezing on admission, improved with systemic steroid, bronchodilators. Pulmonary following.  Lactic acidosis due to respiratory distress/sepsis: resolved.  AKI: -Creatinine peaked to 1.13, now at 0.6-0.7 tolerating Lasix.monitor. 05/28/2020: AKI has resolved. Serum creatinine today is 0.72.  Anemia likely in the setting of chronic disease/?  Etiology-ordered iron panel for  a.m.  Morbid obesity with BMI 42.6 outpatient weight loss follow-up with PCP OSA noncompliant with CPAP, untreated: BiPAP bedtime.  Has not been using cpap at home for sometime, although has one  Primary malignant neoplasm of bronchus of left lower lobe, " I have been dealing with it"- she says "it is stage 3 and never will be cured and learning to live with it", sees Dr Julien Nordmann.   Malignant neoplasm of upper-outer quadrant of left breast in female, estrogen receptor negative triple negative status post lumpectomy . on adjuvant chemo with Adriamycin and Cytoxan had recent chemotherapy with poor tolerance, echocardiogram normal.   GOC: Patient remains full code. Palliative care input is appreciated.   Nutrition: Diet Order            DIET SOFT Room service appropriate? No; Fluid consistency: Thin  Diet effective now                  Body mass index is 42.9 kg/m.   DVT prophylaxis: enoxaparin (LOVENOX) injection 40 mg Start: 05/24/20 2200 Code Status:   Code Status: Full Code  Family Communication: plan of care discussed with patient at bedside.  Status is: Inpatient  Remains inpatient appropriate because:IV treatments appropriate due to intensity of illness or inability to take PO and Inpatient level of care appropriate due to severity of illness   Dispo: The patient is from: Home w/ husband, usually ambulatory              Anticipated d/c is to: Home PT OT eval once oxygen requirement improves              Anticipated d/c date is: 2-3 days              Patient currently is medically stable to d/c.   Consultants:see note  Procedures:see note  Culture/Microbiology    Component Value Date/Time   SDES PLEURAL LEFT 05/25/2020 1236   SDES PLEURAL LEFT 05/25/2020 1236   [place] 05/25/2020 1236   SPECREQUEST NONE 05/25/2020 1236   CULT  05/25/2020 1236    NO GROWTH 4 DAYS Performed at [facility], [place] [address] [place], [place] [address]    REPTSTATUS  PENDING 05/25/2020 1236   REPTSTATUS 05/25/2020 FINAL 05/25/2020 1236    Other culture-see note  Medications: Scheduled Meds: . aspirin  81 mg Oral Daily  . budesonide (PULMICORT) nebulizer solution  0.25 mg Nebulization BID  . Chlorhexidine Gluconate Cloth  6 each Topical Daily  . enoxaparin (LOVENOX) injection  40 mg Subcutaneous Q24H  . furosemide  40 mg Oral Daily  . ipratropium  0.5 mg Nebulization Once  . ipratropium-albuterol  3 mL Nebulization TID  . mouth rinse  15 mL Mouth Rinse BID  . methylPREDNISolone (SOLU-MEDROL) injection  40 mg Intravenous Q8H  . montelukast  10 mg Oral QHS  . sodium chloride flush  10-40 mL Intracatheter Q12H  . sodium chloride flush  3 mL Intravenous Q12H  . umeclidinium-vilanterol  1 puff Inhalation Daily   Continuous Infusions:   Antimicrobials: Anti-infectives (From admission, onward)   Start     Dose/Rate Route Frequency Ordered Stop   05/26/20 2200  doxycycline (VIBRA-TABS) tablet 100 mg        100 mg Oral Every 12 hours 05/26/20 1404 05/28/20 2144   05/25/20 2100  cefTRIAXone (ROCEPHIN) 2 g in sodium chloride 0.9 % 100 mL IVPB        2 g 200 mL/hr over 30 Minutes Intravenous Every  24 hours 05/24/20 2126 05/28/20 2215   05/24/20 2200  azithromycin (ZITHROMAX) 500 mg in sodium chloride 0.9 % 250 mL IVPB  Status:  Discontinued        500 mg 250 mL/hr over 60 Minutes Intravenous Every 24 hours 05/24/20 2126 05/26/20 1404   05/24/20 2015  cefTRIAXone (ROCEPHIN) 2 g in sodium chloride 0.9 % 100 mL IVPB  Status:  Discontinued        2 g 200 mL/hr over 30 Minutes Intravenous Every 24 hours 05/24/20 2004 05/24/20 2128   05/24/20 2015  azithromycin (ZITHROMAX) 500 mg in sodium chloride 0.9 % 250 mL IVPB  Status:  Discontinued        500 mg 250 mL/hr over 60 Minutes Intravenous Every 24 hours 05/24/20 2004 05/24/20 2128     Objective: Vitals: Today's Vitals   05/29/20 0900 05/29/20 0957 05/29/20 1100 05/29/20 1218  BP:    129/65  Pulse:   88  (!) 102  Resp:  20  (!) 24  Temp:    (!) 97.5 F (36.4 C)  TempSrc:    Oral  SpO2:  94%    Weight:      Height:      PainSc: 0-No pain  0-No pain     Intake/Output Summary (Last 24 hours) at 05/29/2020 1304 Last data filed at 05/29/2020 1245 Gross per 24 hour  Intake --  Output 3291 ml  Net -3291 ml   Filed Weights   05/25/20 2237 05/26/20 0105 05/27/20 0500  Weight: 105.7 kg 105.7 kg 106.4 kg   Weight change:   Intake/Output from previous day: 11/12 0701 - 11/13 0700 In: -  Out: 2591 [Urine:2591] Intake/Output this shift: Total I/O In: -  Out: 700 [Urine:700]  Examination: General exam: Patient is obese. Patient is denying any distress. Patient is awake, alert and oriented to time, place and person.  HEENT:Oral mucosa moist, Ear/Nose WNL grossly, dentition normal. Respiratory system: Severely decreased air entry, with inspiratory and expiratory wheeze.  Cardiovascular system: S1 & S2. Gastrointestinal system: Abdomen is obese, soft and nontender. Organs are difficult to assess. Nervous System:  Alert, awake, moving extremities and grossly nonfocal Extremities: Fullness of the ankle.  Data Reviewed: I have personally reviewed following labs and imaging studies CBC: Recent Labs  Lab 05/24/20 1847 05/24/20 1847 05/24/20 2120 05/25/20 0254 05/26/20 0505 05/27/20 0155 05/28/20 0230  WBC 17.1*  --   --  17.0* 16.8* 12.6* 12.4*  NEUTROABS 16.4*  --   --   --   --   --   --   HGB 11.6*   < > 12.6 10.4* 9.4* 9.4* 9.8*  HCT 40.3   < > 37.0 36.3 32.7* 31.7* 33.7*  MCV 93.7  --   --  94.5 94.8 94.1 92.8  PLT 345  --   --  337 352 370 390   < > = values in this interval not displayed.   Basic Metabolic Panel: Recent Labs  Lab 05/24/20 1847 05/24/20 1847 05/24/20 2120 05/25/20 0254 05/26/20 0505 05/27/20 0155 05/28/20 0230  NA 137   < > 139 139 142 144 143  K 4.0   < > 3.3* 3.5 4.0 3.8 4.1  CL 89*  --   --  90* 95* 93* 92*  CO2 34*  --   --  34* 39* 43*  41*  GLUCOSE 149*  --   --  226* 133* 179* 249*  BUN 16  --   --  20  27* 22 17  CREATININE 0.90  --   --  1.13* 0.69 0.70 0.72  CALCIUM 8.7*  --   --  8.5* 8.5* 8.4* 8.5*   < > = values in this interval not displayed.   GFR: Estimated Creatinine Clearance: 83.6 mL/min (by C-G formula based on SCr of 0.72 mg/dL). Liver Function Tests: Recent Labs  Lab 05/25/20 0254  AST 26  ALT 26  ALKPHOS 86  BILITOT 0.6  PROT 6.4*  ALBUMIN 1.8*   No results for input(s): LIPASE, AMYLASE in the last 168 hours. No results for input(s): AMMONIA in the last 168 hours. Coagulation Profile: Recent Labs  Lab 05/24/20 2004  INR 1.3*   Cardiac Enzymes: No results for input(s): CKTOTAL, CKMB, CKMBINDEX, TROPONINI in the last 168 hours. BNP (last 3 results) No results for input(s): PROBNP in the last 8760 hours. HbA1C: No results for input(s): HGBA1C in the last 72 hours. CBG: Recent Labs  Lab 05/26/20 1948 05/26/20 2331 05/27/20 0337 05/27/20 0743 05/28/20 0748  GLUCAP 154* 189* 142* 141* 123*   Lipid Profile: No results for input(s): CHOL, HDL, LDLCALC, TRIG, CHOLHDL, LDLDIRECT in the last 72 hours. Thyroid Function Tests: No results for input(s): TSH, T4TOTAL, FREET4, T3FREE, THYROIDAB in the last 72 hours. Anemia Panel: Recent Labs    05/28/20 0230 05/28/20 0250  VITAMINB12 1,367*  --   FOLATE  --  4.5*  FERRITIN 217  --   TIBC 204*  --   IRON 49  --   RETICCTPCT  --  1.9   Sepsis Labs: Recent Labs  Lab 05/24/20 2004 05/24/20 2335 05/25/20 0254 05/25/20 0520  LATICACIDVEN 2.3* 5.2* 3.6* 1.0    Recent Results (from the past 240 hour(s))  Respiratory Panel by RT PCR (Flu A&B, Covid) - Nasopharyngeal Swab     Status: None   Collection Time: 05/24/20  6:47 PM   Specimen: Nasopharyngeal Swab  Result Value Ref Range Status   SARS Coronavirus 2 by RT PCR NEGATIVE NEGATIVE Final    Comment: (NOTE) SARS-CoV-2 target nucleic acids are NOT DETECTED.  The SARS-CoV-2 RNA is  generally detectable in upper respiratoy specimens during the acute phase of infection. The lowest concentration of SARS-CoV-2 viral copies this assay can detect is 131 copies/mL. A negative result does not preclude SARS-Cov-2 infection and should not be used as the sole basis for treatment or other patient management decisions. A negative result may occur with  improper specimen collection/handling, submission of specimen other than nasopharyngeal swab, presence of viral mutation(s) within the areas targeted by this assay, and inadequate number of viral copies (<131 copies/mL). A negative result must be combined with clinical observations, patient history, and epidemiological information. The expected result is Negative.  Fact Sheet for Patients:  PinkCheek.be  Fact Sheet for Healthcare Providers:  GravelBags.it  This test is no t yet approved or cleared by the [place] FDA and  has been authorized for detection and/or diagnosis of SARS-CoV-2 by FDA under an Emergency Use Authorization (EUA). This EUA will remain  in effect (meaning this test can be used) for the duration of the COVID-19 declaration under Section 564(b)(1) of the Act, 21 U.S.C. section 360bbb-3(b)(1), unless the authorization is terminated or revoked sooner.     Influenza A by PCR NEGATIVE NEGATIVE Final   Influenza B by PCR NEGATIVE NEGATIVE Final    Comment: (NOTE) The Xpert Xpress SARS-CoV-2/FLU/RSV assay is intended as an aid in  the diagnosis of influenza from Nasopharyngeal swab specimens  and  should not be used as a sole basis for treatment. Nasal washings and  aspirates are unacceptable for Xpert Xpress SARS-CoV-2/FLU/RSV  testing.  Fact Sheet for Patients: PinkCheek.be  Fact Sheet for Healthcare Providers: GravelBags.it  This test is not yet approved or cleared by the [place] FDA and  has been authorized for detection and/or diagnosis of SARS-CoV-2 by  FDA under an Emergency Use Authorization (EUA). This EUA will remain  in effect (meaning this test can be used) for the duration of the  Covid-19 declaration under Section 564(b)(1) of the Act, 21  U.S.C. section 360bbb-3(b)(1), unless the authorization is  terminated or revoked. Performed at [facility], [place] [address] [place], [place] [address]   Blood Culture (routine x 2)     Status: None   Collection Time: 05/24/20  8:40 PM   Specimen: BLOOD RIGHT ARM  Result Value Ref Range Status   Specimen Description BLOOD RIGHT ARM  Final   Special Requests   Final    BOTTLES DRAWN AEROBIC AND ANAEROBIC Blood Culture adequate volume   Culture   Final    NO GROWTH 5 DAYS Performed at [facility], [place] [address] [place], [place] [address]    Report Status 05/29/2020 FINAL  Final  Urine culture     Status: Abnormal   Collection Time: 05/24/20  8:50 PM   Specimen: Urine, Clean Catch  Result Value Ref Range Status   Specimen Description URINE, CLEAN CATCH  Final   Special Requests   Final    NONE Performed at [facility], [place] [address], [place], [place] [address]    Culture MULTIPLE SPECIES PRESENT, SUGGEST RECOLLECTION (A)  Final   Report Status 05/26/2020 FINAL  Final  Blood Culture (routine x 2)     Status: None   Collection Time: 05/24/20  9:06 PM   Specimen: BLOOD RIGHT HAND  Result Value Ref Range Status   Specimen Description BLOOD RIGHT HAND  Final   Special Requests   Final    BOTTLES DRAWN AEROBIC AND ANAEROBIC Blood Culture results may not be optimal due to an inadequate volume of blood received in culture bottles   Culture   Final    NO GROWTH 5 DAYS Performed at [facility], [place] [address] [place], [place] [address]    Report Status 05/29/2020 FINAL  Final  Culture, body fluid-bottle     Status: None (Preliminary result)   Collection Time: 05/25/20 12:36  PM   Specimen: Pleura  Result Value Ref Range Status   Specimen Description PLEURAL LEFT  Final   Special Requests NONE  Final   Culture   Final    NO GROWTH 4 DAYS Performed at [facility] [address], [place], [place] [address]    Report Status PENDING  Incomplete  Gram stain     Status: None   Collection Time: 05/25/20 12:36 PM   Specimen: Pleura  Result Value Ref Range Status   Specimen Description PLEURAL LEFT  Final   Special Requests NONE  Final   Gram Stain   Final    WBC PRESENT,BOTH PMN AND MONONUCLEAR RED BLOOD CELLS PRESENT NO ORGANISMS SEEN CYTOSPIN SMEAR Performed at [facility], [address], [place], [place] [address]    Report Status 05/25/2020 FINAL  Final     Radiology Studies: No results found.   LOS: 5 days   Bonnell Public, MD [facility]  05/29/2020, 1:04 PM

## 2020-05-29 NOTE — Evaluation (Signed)
Occupational Therapy Evaluation Patient Details Name: Deborah [name] MRN: [id number] DOB: June 08, 1956 Today's Date: 05/29/2020    History of Present Illness Pt is a 64 year old female with PMHx including left-sided breast cancer status post lumpectomy and adjuvant chemo, left lower lobe lung cancer, chronic tachycardia in 130s to 140s at some point, CHF with preserved EF, COPD/chronic hypoxic respiratory failure on 4 L home oxygen, hypertension, OSA/pulmonary hypertension presented with worsening hypoxia, shortness of breath for couple of weeks. Pt admitted with multifactorial hypoxia including Acute on chronic respiratory failure with hypoxia 2/2 left exudative pleural effusion, left-sided pneumonia, copd w/ acute exacerbation, CAP. Pt underwent thoracentesis 11/9.    Clinical Impression   This 64 y/o female presents with the above. PTA pt reports living with spouse who assists with bathing ADL, provides some supervision for mobility tasks though pt mobilizing without AD. Pt very pleasant and willing to participate in therapy session today. She tolerated bed mobility and stand pivot to recliner during session - deferred further mobility given pt with tenuous cardiorespiratory status at this time. Overall pt requiring minA for functional transfers (via HHA), up to maxA for LB ADL. Pt on 6L HFNC upon arrival to room, lowest SpO2 noted with bed mobility and functional transfers today was 86%, seated EOB pt taking approx 1 min for O2 sats to return to 90% (cues for deep breathing) and increased O2 to 8L HFNC prior to transfer OOB. With seated rest once positioned in recliner pt SpO2 returning to >/=90% within approx 2 min, maintaining at 92-93% end of session (kept at 8L HFNC end of session with RN made aware). Max HR noted 131 with activity, DOE 1-2/4 throughout. Am hopeful pt will make steady progress as SpO2/breathing continues to improve. She will benefit from continued acute OT services, currently  recommend follow up [facility] services after discharge to maximize her overall endurance, safety and independence with ADL And mobility.     Follow Up Recommendations  Home health OT;Supervision/Assistance - 24 hour    Equipment Recommendations  3 in 1 bedside commode           Precautions / Restrictions Precautions Precautions: Fall Restrictions Weight Bearing Restrictions: No      Mobility Bed Mobility Overal bed mobility: Needs Assistance Bed Mobility: Supine to Sit     Supine to sit: Min assist     General bed mobility comments: pt seeking HHA For trunk elevation    Transfers Overall transfer level: Needs assistance Equipment used: 1 person hand held assist Transfers: Sit to/from Stand;Stand Pivot Transfers Sit to Stand: Min assist Stand pivot transfers: Min assist       General transfer comment: steadying assist throughout with VCs to use UE to assist with pushing to standing, good powerup to stand; steadying assist for taking few steps to recliner, pt slow/guarded given hasn't been OOB since admit; transfer via face to face method     Balance Overall balance assessment: Needs assistance Sitting-balance support: Feet supported Sitting balance-Leahy Scale: Good Sitting balance - Comments: supervision   Standing balance support: Single extremity supported;Bilateral upper extremity supported Standing balance-Leahy Scale: Poor Standing balance comment: reliant on external assist                            ADL either performed or assessed with clinical judgement   ADL Overall ADL's : Needs assistance/impaired Eating/Feeding: Modified independent;Sitting   Grooming: Set up;Sitting   Upper Body Bathing: Min guard;Sitting  Lower Body Bathing: Moderate assistance;Sit to/from stand   Upper Body Dressing : Set up;Sitting   Lower Body Dressing: Maximal assistance;Sit to/from stand Lower Body Dressing Details (indicate cue type and reason): assist for  socks today; minA for standing balance  Toilet Transfer: Minimal assistance;Stand-pivot;BSC Toilet Transfer Details (indicate cue type and reason): simulated via transfer to reclier  Toileting- Clothing Manipulation and Hygiene: Moderate assistance;Sit to/from stand       Functional mobility during ADLs: Minimal assistance (stand pivot) General ADL Comments: pt mostly limited due to decreased cardiorespiratory status                         Pertinent Vitals/Pain Pain Assessment: No/denies pain     Hand Dominance     Extremity/Trunk Assessment Upper Extremity Assessment Upper Extremity Assessment: Generalized weakness   Lower Extremity Assessment Lower Extremity Assessment: Defer to PT evaluation   Cervical / Trunk Assessment Cervical / Trunk Assessment: Normal   Communication Communication Communication: No difficulties   Cognition Arousal/Alertness: Awake/alert Behavior During Therapy: WFL for tasks assessed/performed Overall Cognitive Status: Within Functional Limits for tasks assessed                                     General Comments       Exercises     Shoulder Instructions      Home Living Family/patient expects to be discharged to:: Private residence Living Arrangements: Spouse/significant other Available Help at Discharge: Family;Available PRN/intermittently Type of Home: House Home Access: Stairs to enter [employer] of Steps: 4-6 Entrance Stairs-Rails: Right Home Layout: One level     Bathroom Shower/Tub: Teacher, early years/pre: Standard     Home Equipment: Environmental consultant - 2 wheels;Walker - 4 wheels;Shower seat;Wheelchair - power;Wheelchair - manual          Prior Functioning/Environment Level of Independence: Needs assistance  Gait / Transfers Assistance Needed: no use of AD, reports spouse typically provides supervision for safety ADL's / Homemaking Assistance Needed: spouse assists with tub  transfers, bathing PRN. pt typically wears gowns so assist for LB dressing typically not necessary, spouse helps with iADL such as meals             OT Problem List: Decreased activity tolerance;Obesity;Cardiopulmonary status limiting activity;Impaired balance (sitting and/or standing);Decreased strength;Decreased knowledge of use of DME or AE;Decreased knowledge of precautions      OT Treatment/Interventions: Self-care/ADL training;Therapeutic exercise;Energy conservation;DME and/or AE instruction;Therapeutic activities;Patient/family education;Balance training    OT Goals(Current goals can be found in the care plan section) Acute Rehab OT Goals Patient Stated Goal: to go home, breathe better OT Goal Formulation: With patient Time For Goal Achievement: 06/12/20 Potential to Achieve Goals: Good  OT Frequency: Min 2X/week   Barriers to D/C:            Co-evaluation              AM-PAC OT "6 Clicks" Daily Activity     Outcome Measure Help from another person eating meals?: None Help from another person taking care of personal grooming?: A Little Help from another person toileting, which includes using toliet, bedpan, or urinal?: A Lot Help from another person bathing (including washing, rinsing, drying)?: A Lot Help from another person to put on and taking off regular upper body clothing?: A Little Help from another person to put on and taking off regular  lower body clothing?: A Lot 6 Click Score: 16   End of Session Equipment Utilized During Treatment: Oxygen (6-8L HFNC) Nurse Communication: Mobility status  Activity Tolerance: Patient tolerated treatment well Patient left: in chair;with call bell/phone within reach  OT Visit Diagnosis: Muscle weakness (generalized) (M62.81);Other abnormalities of gait and mobility (R26.89);Other (comment) (decreased activity tolerance)                Time: 4099-2780 OT Time Calculation (min): 24 min Charges:  OT General Charges $OT  Visit: 1 Visit OT Evaluation $OT Eval Moderate Complexity: 1 Mod OT Treatments $Self Care/Home Management : 8-22 mins  Lou Cal, OT Acute Rehabilitation Services Pager [phone] Office [phone]   Raymondo Band 05/29/2020, 11:53 AM

## 2020-05-29 NOTE — Plan of Care (Signed)

## 2020-05-30 LAB — BASIC METABOLIC PANEL
Anion gap: 9 (ref 5–15)
BUN: 18 mg/dL (ref 8–23)
CO2: 36 mmol/L — ABNORMAL HIGH (ref 22–32)
Calcium: 8.4 mg/dL — ABNORMAL LOW (ref 8.9–10.3)
Chloride: 93 mmol/L — ABNORMAL LOW (ref 98–111)
Creatinine, Ser: 0.61 mg/dL (ref 0.44–1.00)
GFR, Estimated: >60 mL/min (ref >60)
Glucose, Bld: 166 mg/dL — ABNORMAL HIGH (ref 70–99)
Potassium: 4.4 mmol/L (ref 3.5–5.1)
Sodium: 138 mmol/L (ref 135–145)

## 2020-05-30 LAB — CULTURE, BODY FLUID W GRAM STAIN -BOTTLE: Culture: NO GROWTH

## 2020-05-30 MED ORDER — DILTIAZEM HCL ER COATED BEADS 240 MG PO CP24
240.0000 mg | ORAL_CAPSULE | Freq: Every day | ORAL | Status: DC
  Administered 2020-05-30 – 2020-06-02 (×4): 240 mg via ORAL
  Filled 2020-05-30 (×4): qty 1

## 2020-05-30 NOTE — Progress Notes (Signed)
PROGRESS NOTE    Deborah [name]  VHQ:[id number] DOB: 02-Jun-1956 DOA: 05/24/2020 PCP: Pleas Koch, NP   Brief Narrative:  This 64 years old female with significant history of left sided breast cancer status post lumpectomy and adjuvant chemotherapy, left lower lobe lung cancer,  chronic tachycardia,  CHF with preserved EF, COPD, chronic hypoxic respiratory failure on 4 L supplemental oxygen, hypertension, obstructive sleep apnea presents in the ED with worsening hypoxia which has been going on for couple of weeks.  Patient has discontinued her Lasix to prevent her from getting up and going to the bathroom to prevent falling.  She was found to have left-sided pleural effusion with associated lung collapse and diffuse density representing pneumonia.  Patient underwent left thoracocentesis ,  tolerated well.  Patient was initially placed on BiPAP but weaned down to high flow nasal cannula 6 L at this point.  She is on BiPAP as needed and at bedtime.   Assessment & Plan:   Principal Problem:   Acute on chronic respiratory failure with hypoxia (HCC) Active Problems:   Chronic respiratory failure (HCC)   Morbid obesity (HCC)   CRLD (chronic restrictive lung disease) secondary to super morbid obesity with mild COPD component   Pulmonary HTN (HCC)   OSA (obstructive sleep apnea)   Chronic diastolic heart failure (HCC)   COPD exacerbation (HCC)   Severe sepsis (HCC)   COPD (chronic obstructive pulmonary disease) (HCC)   Primary malignant neoplasm of bronchus of left lower lobe (HCC)   Malignant neoplasm of upper-outer quadrant of left breast in female, estrogen receptor negative ([place])   Acute hypoxemic respiratory failure ([place])   Advanced care planning/counseling discussion   Palliative care by specialist   # Acute on chronic hypoxic respiratory failure 2/2 left sided exudative pleural effusion, left-sided pneumonia, copd w/ acute exacerbation, initially on BiPAP in ED: on 4 L [place] at  baseline: Multifactorial hypoxia also in the setting of poor tolerance to chemotherapy.  Status post thoracentesis 11/9- pleural fluid Gram stain negative, no growth so far.  Weaned down to high flow nasal cannula, 6 L now.  Continue ceftriaxone/azithromycin, iv steroids, bronchodilators- ADD duonebs tid, PRN nebs, cont Lasix 40 mg.  Margin on her lumpectomies are negative and will likely not receive chemo given poor tolerance in the first cycle.  Cont PT OT, follow-up culture data, urine antigen, continue PT OT, palliative care evaluation. Pulmonary following She is slowly improving,  her oxygen requirement has improved.  She is on 6 L high flow nasal cannula saturating 97%.  # Severe sepsis POA  Sec. to community-acquired pneumonia:  Tachycardia, tachypnea and leukocytosis improving. Culture returned no growth so far. Continue Ceftriaxone/Azithro. 05/29/2020: Patient is currently on ceftriaxone and doxycycline. Complete course of antibiotics. Sepsis physiology improving.  Chronic diastolic CHF:  Repeat echo shows EF 55 to 60%. She reports not  taking diuretics for 6 weeks  . She is back on Lasix at 40 mg.  BNP stable.  COPD with acute exacerbation/Chronic restrictive lung disease:   Due to pneumonia. Significant wheezing on admission, improved with systemic steroid, bronchodilators. Pulmonary following.  Lactic acidosis due to respiratory distress/sepsis: >>> resolved.  AKI: -Creatinine peaked to 1.13, now at 0.6-0.7 tolerating Lasix.monitor. 05/28/2020: AKI has resolved. Serum creatinine today is 0.60  Anemia likely in the setting of chronic disease/  Etiology- Iron panel normal.  Morbid obesity with BMI 42.6 outpatient weight loss follow-up with PCP.  OSA noncompliant with CPAP, untreated: BiPAP bedtime.  Has not  been using cpap at home for sometime, although has one  Primary malignant neoplasm of bronchus of left lower lobe: Follows with Dr. Rogue Jury.   " I have been  dealing with it"- she says "it is stage 3 and never will be cured and learning to live with it".  Malignant neoplasm of upper-outer quadrant of left breast in female, estrogen receptor negative triple negative status post lumpectomy . on adjuvant chemo with Adriamycin and Cytoxan,  had recent chemotherapy with poor tolerance, echocardiogram normal.   GOC: Patient remains full code. Palliative care input is appreciated.    DVT prophylaxis: Lovenox Code Status: Full code Family Communication: Husband was at bedside Disposition Plan:   Status is: Inpatient  Remains inpatient appropriate because:Inpatient level of care appropriate due to severity of illness   Dispo: The patient is from: Home              Anticipated d/c is to: Home with Home PT              Anticipated d/c date is: 2 days              Patient currently is not medically stable to d/c.    Consultants:   Interventional radiology  Procedures:  Left thoracocentesis  Antimicrobials: Anti-infectives (From admission, onward)   Start     Dose/Rate Route Frequency Ordered Stop   05/26/20 2200  doxycycline (VIBRA-TABS) tablet 100 mg        100 mg Oral Every 12 hours 05/26/20 1404 05/28/20 2144   05/25/20 2100  cefTRIAXone (ROCEPHIN) 2 g in sodium chloride 0.9 % 100 mL IVPB        2 g 200 mL/hr over 30 Minutes Intravenous Every 24 hours 05/24/20 2126 05/28/20 2215   05/24/20 2200  azithromycin (ZITHROMAX) 500 mg in sodium chloride 0.9 % 250 mL IVPB  Status:  Discontinued        500 mg 250 mL/hr over 60 Minutes Intravenous Every 24 hours 05/24/20 2126 05/26/20 1404   05/24/20 2015  cefTRIAXone (ROCEPHIN) 2 g in sodium chloride 0.9 % 100 mL IVPB  Status:  Discontinued        2 g 200 mL/hr over 30 Minutes Intravenous Every 24 hours 05/24/20 2004 05/24/20 2128   05/24/20 2015  azithromycin (ZITHROMAX) 500 mg in sodium chloride 0.9 % 250 mL IVPB  Status:  Discontinued        500 mg 250 mL/hr over 60 Minutes Intravenous  Every 24 hours 05/24/20 2004 05/24/20 2128      Subjective: Patient was seen and examined at bedside.  Overnight events noted.  Patient weaned down to 6 L high flow supplemental oxygen saturating 96%.  She denies any chest pain, she reports breathing is improved after thoracocentesis.  Objective: Vitals:   05/30/20 0724 05/30/20 0737 05/30/20 1115 05/30/20 1127  BP:  133/64 (!) 132/96   Pulse:  75 (!) 117 (!) 106  Resp:   17 15  Temp:  97.9 F (36.6 C) 97.8 F (36.6 C)   TempSrc:  Oral Oral   SpO2: 95% 94% (!) 88% 93%  Weight:      Height:        Intake/Output Summary (Last 24 hours) at 05/30/2020 1257 Last data filed at 05/30/2020 1121 Gross per 24 hour  Intake 360 ml  Output 2200 ml  Net -1840 ml   Filed Weights   05/26/20 0105 05/27/20 0500 05/30/20 0500  Weight: 105.7 kg 106.4 kg 102.5 kg  Examination:  General exam: Appears calm and comfortable.  No respiratory distress. Respiratory system: Wheezing noted.  Respiratory effort normal. Cardiovascular system: S1 & S2 heard, RRR. No JVD, murmurs, rubs, gallops or clicks. No pedal edema. Gastrointestinal system: Abdomen is nondistended, soft and nontender. No organomegaly or masses felt. Normal bowel sounds heard. Central nervous system: Alert and oriented. No focal neurological deficits. Extremities:  Skin: No rashes, lesions or ulcers Psychiatry: Judgement and insight appear normal. Mood & affect appropriate.     Data Reviewed: I have personally reviewed following labs and imaging studies  CBC: Recent Labs  Lab 05/24/20 1847 05/24/20 1847 05/24/20 2120 05/25/20 0254 05/26/20 0505 05/27/20 0155 05/28/20 0230  WBC 17.1*  --   --  17.0* 16.8* 12.6* 12.4*  NEUTROABS 16.4*  --   --   --   --   --   --   HGB 11.6*   < > 12.6 10.4* 9.4* 9.4* 9.8*  HCT 40.3   < > 37.0 36.3 32.7* 31.7* 33.7*  MCV 93.7  --   --  94.5 94.8 94.1 92.8  PLT 345  --   --  337 352 370 390   < > = values in this interval not  displayed.   Basic Metabolic Panel: Recent Labs  Lab 05/25/20 0254 05/26/20 0505 05/27/20 0155 05/28/20 0230 05/30/20 0807  NA 139 142 144 143 138  K 3.5 4.0 3.8 4.1 4.4  CL 90* 95* 93* 92* 93*  CO2 34* 39* 43* 41* 36*  GLUCOSE 226* 133* 179* 249* 166*  BUN 20 27* 22 17 18   CREATININE 1.13* 0.69 0.70 0.72 0.61  CALCIUM 8.5* 8.5* 8.4* 8.5* 8.4*   GFR: Estimated Creatinine Clearance: 81.8 mL/min (by C-G formula based on SCr of 0.61 mg/dL). Liver Function Tests: Recent Labs  Lab 05/25/20 0254  AST 26  ALT 26  ALKPHOS 86  BILITOT 0.6  PROT 6.4*  ALBUMIN 1.8*   No results for input(s): LIPASE, AMYLASE in the last 168 hours. No results for input(s): AMMONIA in the last 168 hours. Coagulation Profile: Recent Labs  Lab 05/24/20 2004  INR 1.3*   Cardiac Enzymes: No results for input(s): CKTOTAL, CKMB, CKMBINDEX, TROPONINI in the last 168 hours. BNP (last 3 results) No results for input(s): PROBNP in the last 8760 hours. HbA1C: No results for input(s): HGBA1C in the last 72 hours. CBG: Recent Labs  Lab 05/26/20 1948 05/26/20 2331 05/27/20 0337 05/27/20 0743 05/28/20 0748  GLUCAP 154* 189* 142* 141* 123*   Lipid Profile: No results for input(s): CHOL, HDL, LDLCALC, TRIG, CHOLHDL, LDLDIRECT in the last 72 hours. Thyroid Function Tests: No results for input(s): TSH, T4TOTAL, FREET4, T3FREE, THYROIDAB in the last 72 hours. Anemia Panel: Recent Labs    05/28/20 0230 05/28/20 0250  VITAMINB12 1,367*  --   FOLATE  --  4.5*  FERRITIN 217  --   TIBC 204*  --   IRON 49  --   RETICCTPCT  --  1.9   Sepsis Labs: Recent Labs  Lab 05/24/20 2004 05/24/20 2335 05/25/20 0254 05/25/20 0520  LATICACIDVEN 2.3* 5.2* 3.6* 1.0    Recent Results (from the past 240 hour(s))  Respiratory Panel by RT PCR (Flu A&B, Covid) - Nasopharyngeal Swab     Status: None   Collection Time: 05/24/20  6:47 PM   Specimen: Nasopharyngeal Swab  Result Value Ref Range Status   SARS  Coronavirus 2 by RT PCR NEGATIVE NEGATIVE Final    Comment: (NOTE) SARS-CoV-2  target nucleic acids are NOT DETECTED.  The SARS-CoV-2 RNA is generally detectable in upper respiratoy specimens during the acute phase of infection. The lowest concentration of SARS-CoV-2 viral copies this assay can detect is 131 copies/mL. A negative result does not preclude SARS-Cov-2 infection and should not be used as the sole basis for treatment or other patient management decisions. A negative result may occur with  improper specimen collection/handling, submission of specimen other than nasopharyngeal swab, presence of viral mutation(s) within the areas targeted by this assay, and inadequate number of viral copies (<131 copies/mL). A negative result must be combined with clinical observations, patient history, and epidemiological information. The expected result is Negative.  Fact Sheet for Patients:  PinkCheek.be  Fact Sheet for Healthcare Providers:  GravelBags.it  This test is no t yet approved or cleared by the [place] FDA and  has been authorized for detection and/or diagnosis of SARS-CoV-2 by FDA under an Emergency Use Authorization (EUA). This EUA will remain  in effect (meaning this test can be used) for the duration of the COVID-19 declaration under Section 564(b)(1) of the Act, 21 U.S.C. section 360bbb-3(b)(1), unless the authorization is terminated or revoked sooner.     Influenza A by PCR NEGATIVE NEGATIVE Final   Influenza B by PCR NEGATIVE NEGATIVE Final    Comment: (NOTE) The Xpert Xpress SARS-CoV-2/FLU/RSV assay is intended as an aid in  the diagnosis of influenza from Nasopharyngeal swab specimens and  should not be used as a sole basis for treatment. Nasal washings and  aspirates are unacceptable for Xpert Xpress SARS-CoV-2/FLU/RSV  testing.  Fact Sheet for  Patients: PinkCheek.be  Fact Sheet for Healthcare Providers: GravelBags.it  This test is not yet approved or cleared by the [place] FDA and  has been authorized for detection and/or diagnosis of SARS-CoV-2 by  FDA under an Emergency Use Authorization (EUA). This EUA will remain  in effect (meaning this test can be used) for the duration of the  Covid-19 declaration under Section 564(b)(1) of the Act, 21  U.S.C. section 360bbb-3(b)(1), unless the authorization is  terminated or revoked. Performed at [facility], [place] [address], [place], [place] [address]   Blood Culture (routine x 2)     Status: None   Collection Time: 05/24/20  8:40 PM   Specimen: BLOOD RIGHT ARM  Result Value Ref Range Status   Specimen Description BLOOD RIGHT ARM  Final   Special Requests   Final    BOTTLES DRAWN AEROBIC AND ANAEROBIC Blood Culture adequate volume   Culture   Final    NO GROWTH 5 DAYS Performed at [facility], [place] [address], [place], [place] [address]    Report Status 05/29/2020 FINAL  Final  Urine culture     Status: Abnormal   Collection Time: 05/24/20  8:50 PM   Specimen: Urine, Clean Catch  Result Value Ref Range Status   Specimen Description URINE, CLEAN CATCH  Final   Special Requests   Final    NONE Performed at [facility], [place] [address], [place],  [address]    Culture MULTIPLE SPECIES PRESENT, SUGGEST RECOLLECTION (A)  Final   Report Status 05/26/2020 FINAL  Final  Blood Culture (routine x 2)     Status: None   Collection Time: 05/24/20  9:06 PM   Specimen: BLOOD RIGHT HAND  Result Value Ref Range Status   Specimen Description BLOOD RIGHT HAND  Final   Special Requests   Final  BOTTLES DRAWN AEROBIC AND ANAEROBIC Blood Culture results may not be optimal due to an inadequate volume of blood received in culture bottles   Culture   Final    NO GROWTH 5 DAYS Performed at [facility], [place] [address] [place], [place] [address]    Report Status 05/29/2020 FINAL  Final  Culture, body fluid-bottle     Status: None   Collection Time: 05/25/20 12:36 PM   Specimen: Pleura  Result Value Ref Range Status   Specimen Description PLEURAL LEFT  Final   Special Requests NONE  Final   Culture   Final    NO GROWTH 5 DAYS Performed at [facility] [address] [place], [place] [address]    Report Status 05/30/2020 FINAL  Final  Gram stain     Status: None   Collection Time: 05/25/20 12:36 PM   Specimen: Pleura  Result Value Ref Range Status   Specimen Description PLEURAL LEFT  Final   Special Requests NONE  Final   Gram Stain   Final    WBC PRESENT,BOTH PMN AND MONONUCLEAR RED BLOOD CELLS PRESENT NO ORGANISMS SEEN CYTOSPIN SMEAR Performed at [facility], [address] [place], [place] [address]    Report Status 05/25/2020 FINAL  Final         Radiology Studies: No results found.      Scheduled Meds:  aspirin  81 mg Oral Daily   budesonide (PULMICORT) nebulizer solution  0.25 mg Nebulization BID   Chlorhexidine Gluconate Cloth  6 each Topical Daily   enoxaparin (LOVENOX) injection  40 mg Subcutaneous Q24H   furosemide  40 mg Oral Daily   ipratropium  0.5 mg Nebulization Once   ipratropium-albuterol  3 mL Nebulization TID   mouth rinse  15 mL Mouth Rinse BID   methylPREDNISolone (SOLU-MEDROL) injection  40 mg Intravenous Q8H   montelukast  10 mg Oral QHS   sodium chloride flush  10-40 mL Intracatheter Q12H   sodium chloride flush  3 mL Intravenous Q12H   umeclidinium-vilanterol  1 puff Inhalation Daily   Continuous Infusions:   LOS: 6 days    Time spent: 35 mins   Mackinley Kiehn, MD [facility]   If 7PM-7AM, please contact night-coverage

## 2020-05-30 NOTE — Plan of Care (Signed)

## 2020-05-31 ENCOUNTER — Inpatient Hospital Stay: Payer: BLUE CROSS/BLUE SHIELD | Admitting: Internal Medicine

## 2020-05-31 LAB — BASIC METABOLIC PANEL WITH GFR
Anion gap: 8 (ref 5–15)
BUN: 19 mg/dL (ref 8–23)
CO2: 36 mmol/L — ABNORMAL HIGH (ref 22–32)
Calcium: 8.5 mg/dL — ABNORMAL LOW (ref 8.9–10.3)
Chloride: 95 mmol/L — ABNORMAL LOW (ref 98–111)
Creatinine, Ser: 0.66 mg/dL (ref 0.44–1.00)
GFR, Estimated: >60 mL/min
Glucose, Bld: 198 mg/dL — ABNORMAL HIGH (ref 70–99)
Potassium: 4.5 mmol/L (ref 3.5–5.1)
Sodium: 139 mmol/L (ref 135–145)

## 2020-05-31 LAB — MAGNESIUM: Magnesium: 2.3 mg/dL (ref 1.7–2.4)

## 2020-05-31 LAB — HEMOGLOBIN AND HEMATOCRIT, BLOOD
HCT: 34.4 % — ABNORMAL LOW (ref 36.0–46.0)
Hemoglobin: 10.2 g/dL — ABNORMAL LOW (ref 12.0–15.0)

## 2020-05-31 LAB — PHOSPHORUS: Phosphorus: 4.6 mg/dL (ref 2.5–4.6)

## 2020-05-31 NOTE — Progress Notes (Signed)
Occupational Therapy Treatment Patient Details Name: Deborah [name] MRN: [id number] DOB: 10/25/1955 Today's Date: 05/31/2020    History of present illness Pt is a 64 year old female admitted with multifactorial hypoxia including Acute on chronic respiratory failure with hypoxia 2/2 left exudative pleural effusion, left-sided pneumonia, copd w/ acute exacerbation, CAP. Pt underwent thoracentesis 11/9. PMHx including left-sided breast cancer status post lumpectomy and adjuvant chemo, left lower lobe lung cancer, chronic tachycardia , CHF with preserved EF, COPD/chronic hypoxic respiratory failure on 4 L home O2, HTN, OSA/pulmonary hypertension   OT comments  Patient seated in recliner upon entry, agreeable to OT session but reports fatigued after just using restroom.  Patient currently required min guard for mobility, grooming at sink standing (progressing to sitting due to increased HR and decreased tolerance) with close supervision.  HR increased to 150s with grooming at sink. SpO2 maintained on 6L HFNC (brief drops to 88-89 but recovers quickly with PLB). Briefly discussed energy conservation techniques. Will continue to follow acutely with plan for HHOT at dc.    Follow Up Recommendations  Home health OT;Supervision/Assistance - 24 hour    Equipment Recommendations  3 in 1 bedside commode    Recommendations for Other Services      Precautions / Restrictions Precautions Precautions: Fall Precaution Comments: watch sats Restrictions Weight Bearing Restrictions: No       Mobility Bed Mobility               General bed mobility comments: OOB in recliner upon entry   Transfers Overall transfer level: Needs assistance Equipment used: Rolling walker (2 wheeled) Transfers: Sit to/from Stand Sit to Stand: Min guard         General transfer comment: min guard for safety, hand placement     Balance Overall balance assessment: Mild deficits observed, not formally tested                                          ADL either performed or assessed with clinical judgement   ADL Overall ADL's : Needs assistance/impaired     Grooming: Supervision/safety;Set up;Sitting;Standing;Oral care Grooming Details (indicate cue type and reason): oral care at sink, initated standing but progressed to sitting due to elevated HR and decreased tolerance                 Toilet Transfer: Min guard;Ambulation;RW Toilet Transfer Details (indicate cue type and reason): simulated to/from recliner for safety          Functional mobility during ADLs: Minimal assistance;Rolling walker;Cueing for safety General ADL Comments: pt mostly limited due to decreased cardiorespiratory status     Vision       Perception     Praxis      Cognition Arousal/Alertness: Awake/alert Behavior During Therapy: WFL for tasks assessed/performed Overall Cognitive Status: Within Functional Limits for tasks assessed                                          Exercises     Shoulder Instructions       General Comments pt limited by cardiopulmonary status--pt HR increased to 150 with minimal static standing when grooming; SpO2 maintained on 6L at 90% or greater with PLB (a few desaturations to high 80s but pt recovers with PLB in less  than 10 seconds0)    Pertinent Vitals/ Pain       Pain Assessment: No/denies pain  Home Living                                          Prior Functioning/Environment              Frequency  Min 2X/week        Progress Toward Goals  OT Goals(current goals can now be found in the care plan section)  Progress towards OT goals: Progressing toward goals  Acute Rehab OT Goals Patient Stated Goal: to go home, breathe better OT Goal Formulation: With patient  Plan Discharge plan remains appropriate;Frequency remains appropriate    Co-evaluation                 AM-PAC OT "6 Clicks"  Daily Activity     Outcome Measure   Help from another person eating meals?: None Help from another person taking care of personal grooming?: A Little Help from another person toileting, which includes using toliet, bedpan, or urinal?: A Lot Help from another person bathing (including washing, rinsing, drying)?: A Lot Help from another person to put on and taking off regular upper body clothing?: A Little Help from another person to put on and taking off regular lower body clothing?: A Lot 6 Click Score: 16    End of Session Equipment Utilized During Treatment: Oxygen;Rolling walker (6L)  OT Visit Diagnosis: Muscle weakness (generalized) (M62.81);Other abnormalities of gait and mobility (R26.89);Other (comment) (decreased activity tolerance)   Activity Tolerance Patient tolerated treatment well   Patient Left in chair;with call bell/phone within reach   Nurse Communication Mobility status        Time: 0160-1093 OT Time Calculation (min): 19 min  Charges: OT General Charges $OT Visit: 1 Visit OT Treatments $Self Care/Home Management : 8-22 mins  Deborah [name], OT [place] Pager [phone] Office [phone]    Delight Stare 05/31/2020, 1:07 PM

## 2020-05-31 NOTE — Progress Notes (Signed)
Physical Therapy Treatment Patient Details Name: Deborah [name] MRN: [id number] DOB: [birth year] Today's Date: 05/31/2020    History of Present Illness Pt is a 64 year old female admitted with multifactorial hypoxia including Acute on chronic respiratory failure with hypoxia 2/2 left exudative pleural effusion, left-sided pneumonia, copd w/ acute exacerbation, CAP. Pt underwent thoracentesis 11/9. PMHx including left-sided breast cancer status post lumpectomy and adjuvant chemo, left lower lobe lung cancer, chronic tachycardia , CHF with preserved EF, COPD/chronic hypoxic respiratory failure on 4 L home O2, HTN, OSA/pulmonary hypertension    PT Comments    Pt very pleasant and eager to move. Pt able to walk in room today on 8L HFNC with SpO2 dropping to 87% with activity with rise to 97% with 2 min seated rest. 92% on 6L at rest. HR up to 113 with gait and 150 with seated HEp, non sustained and decreased to 102 at rest. Pt encouraged to use BSC during day as able to tolerate with Lasix and to increase mobility OOB for meals with nursing staff. Pt educated for HEP and activity progression as well as current supplemental oxygen demand.    Follow Up Recommendations  Home health PT;Supervision/Assistance - 24 hour     Equipment Recommendations  Rolling walker with 5" wheels    Recommendations for Other Services       Precautions / Restrictions Precautions Precautions: Fall Precaution Comments: watch sats    Mobility  Bed Mobility Overal bed mobility: Modified Independent Bed Mobility: Supine to Sit           General bed mobility comments: HOB elevated 30 degrees with use of rail, sleeps in recliner at home  Transfers Overall transfer level: Needs assistance   Transfers: Sit to/from Stand Sit to Stand: Min guard         General transfer comment: guarding for standing, cues for hand placement  Ambulation/Gait Ambulation/Gait assistance: Min guard Gait Distance (Feet):  24 Feet Assistive device: Rolling walker (2 wheeled) Gait Pattern/deviations: Step-through pattern;Decreased stride length;Trunk flexed   Gait velocity interpretation: >2.62 ft/sec, indicative of community ambulatory General Gait Details: cues for pursed lip breathing, RW management and direction. LImited by fatigue and SpO2 dropping to 87% on 8L with 2 min seated rest to recover. 2 trials   Stairs             Wheelchair Mobility    Modified Rankin (Stroke Patients Only)       Balance Overall balance assessment: Mild deficits observed, not formally tested   Sitting balance-Leahy Scale: Good     Standing balance support: Bilateral upper extremity supported Standing balance-Leahy Scale: Poor                              Cognition Arousal/Alertness: Awake/alert Behavior During Therapy: WFL for tasks assessed/performed Overall Cognitive Status: Within Functional Limits for tasks assessed                                        Exercises General Exercises - Lower Extremity Long Arc Quad: AROM;Both;Seated;15 reps Hip Flexion/Marching: AROM;15 reps;Seated;Both    General Comments        Pertinent Vitals/Pain Pain Assessment: No/denies pain    Home Living                      Prior Function  PT Goals (current goals can now be found in the care plan section) Progress towards PT goals: Progressing toward goals    Frequency    Min 3X/week      PT Plan Current plan remains appropriate    Co-evaluation              AM-PAC PT "6 Clicks" Mobility   Outcome Measure  Help needed turning from your back to your side while in a flat bed without using bedrails?: A Little Help needed moving from lying on your back to sitting on the side of a flat bed without using bedrails?: A Little Help needed moving to and from a bed to a chair (including a wheelchair)?: A Little Help needed standing up from a chair using  your arms (e.g., wheelchair or bedside chair)?: A Little Help needed to walk in hospital room?: A Little Help needed climbing 3-5 steps with a railing? : A Lot 6 Click Score: 17    End of Session Equipment Utilized During Treatment: Oxygen;Gait belt Activity Tolerance: Patient tolerated treatment well Patient left: in chair;with call bell/phone within reach Nurse Communication: Mobility status;Other (comment) (o2 demand, BSC during day) PT Visit Diagnosis: Muscle weakness (generalized) (M62.81);Difficulty in walking, not elsewhere classified (R26.2);Other abnormalities of gait and mobility (R26.89)     Time: 1324-4010 PT Time Calculation (min) (ACUTE ONLY): 29 min  Charges:  $Gait Training: 8-22 mins $Therapeutic Activity: 8-22 mins                     Chayson Charters P, PT Acute Rehabilitation Services Pager: [phone] Office: [place] 05/31/2020, 8:51 AM

## 2020-05-31 NOTE — Progress Notes (Signed)
Patient resting comfortably on salter HFNC at 6L.  No respiratory distress noted. BIPAP not needed at this time.  BIPAP is in room on standby.  RT will monitor as needed.

## 2020-05-31 NOTE — Progress Notes (Signed)
PROGRESS NOTE    Deborah [name]  RJJ:[id number] DOB: 01-Feb-1956 DOA: 05/24/2020 PCP: Pleas Koch, NP   Brief Narrative:  This 64 years old female with significant history of left sided breast cancer status post lumpectomy and adjuvant chemotherapy, left lower lobe lung cancer,  chronic tachycardia,  CHF with preserved EF, COPD, chronic hypoxic respiratory failure on 4 L supplemental oxygen at baseline, hypertension, obstructive sleep apnea presents in the ED with worsening hypoxia which has been going on for couple of weeks.  Patient has discontinued her Lasix to prevent her from getting up and going to the bathroom to prevent falling.  She was found to have left-sided pleural effusion with associated lung collapse and diffuse density representing pneumonia.  Patient underwent left thoracocentesis ,  tolerated well.  Patient was initially placed on BiPAP but weaned down to high flow nasal cannula 6 L at this point. She is on BiPAP as needed and at bedtime.   Assessment & Plan:   Principal Problem:   Acute on chronic respiratory failure with hypoxia (HCC) Active Problems:   Chronic respiratory failure (HCC)   Morbid obesity (HCC)   CRLD (chronic restrictive lung disease) secondary to super morbid obesity with mild COPD component   Pulmonary HTN (HCC)   OSA (obstructive sleep apnea)   Chronic diastolic heart failure (HCC)   COPD exacerbation (HCC)   Severe sepsis (HCC)   COPD (chronic obstructive pulmonary disease) (HCC)   Primary malignant neoplasm of bronchus of left lower lobe (HCC)   Malignant neoplasm of upper-outer quadrant of left breast in female, estrogen receptor negative ([place])   Acute hypoxemic respiratory failure ([place])   Advanced care planning/counseling discussion   Palliative care by specialist   # Acute on chronic hypoxic respiratory failure 2/2 left sided exudative pleural effusion, left-sided pneumonia, copd w/ acute exacerbation,  She was initially placed on  BiPAP in ED: on 4 L [place] at baseline: Multifactorial hypoxia also in the setting of poor tolerance to chemotherapy.  Status post thoracentesis 11/9- pleural fluid Gram stain negative, no growth so far.  Weaned down to high flow nasal cannula, 6 L now.  Continue ceftriaxone/azithromycin, iv steroids, bronchodilators- ADD duonebs tid, PRN nebs, cont Lasix 40 mg.  Margin on her lumpectomies are negative and will likely not receive chemo given poor tolerance in the first cycle.  Blood cultures no growth so far, urine culture contaminated,  palliative care evaluation. Pulmonary following She is slowly improving,  her oxygen requirement has improved. She is on 6 L high flow nasal cannula saturating 97%. PT and OT recommended home with home PT.  # Severe sepsis POA  Sec. to community-acquired pneumonia:  Tachycardia, tachypnea and leukocytosis improving. Culture returned no growth so far. Continue Ceftriaxone/Azithro. 05/29/2020: Patient is currently on ceftriaxone and doxycycline. Complete course of antibiotics. Sepsis physiology improving.  Chronic diastolic CHF:  Repeat echo shows EF 55 to 60%. She reports not taking diuretics for 6 weeks  . She is back on Lasix at 40 mg.  BNP stable.  COPD with acute exacerbation/Chronic restrictive lung disease:   Due to pneumonia. Significant wheezing on admission, improved with systemic steroid, bronchodilators. Pulmonary following.  Lactic acidosis due to respiratory distress/sepsis: >>> resolved.  AKI: >>> Resolved.  -Creatinine peaked to 1.13, now at 0.6-0.7 tolerating Lasix.monitor. 05/28/2020: AKI has resolved. Serum creatinine today is 0.60  Anemia likely in the setting of chronic disease /  Iron panel normal.  Morbid obesity with BMI 42.6 outpatient weight loss follow-up  with PCP.  OSA noncompliant with CPAP, untreated: BiPAP bedtime.  Has not been using cpap at home for sometime, although has one  Primary malignant neoplasm of  bronchus of left lower lobe: Follows with Dr. Rogue Jury.   " I have been dealing with it"- she says "it is stage 3 and never will be cured and learning to live with it".  Malignant neoplasm of upper-outer quadrant of left breast in female, estrogen receptor negative,  triple negative status post lumpectomy . on adjuvant chemo with Adriamycin and Cytoxan,  had recent chemotherapy with poor tolerance, echocardiogram normal.   GOC: Patient remains full code. Palliative care input is appreciated.    DVT prophylaxis: Lovenox Code Status: Full code Family Communication: Husband was at bedside Disposition Plan:   Status is: Inpatient  Remains inpatient appropriate because:Inpatient level of care appropriate due to severity of illness   Dispo: The patient is from: Home              Anticipated d/c is to: Home with Home PT              Anticipated d/c date is: 2 days              Patient currently is not medically stable to d/c.    Consultants:   Interventional radiology  Procedures:  Left thoracocentesis  Antimicrobials: Anti-infectives (From admission, onward)   Start     Dose/Rate Route Frequency Ordered Stop   05/26/20 2200  doxycycline (VIBRA-TABS) tablet 100 mg        100 mg Oral Every 12 hours 05/26/20 1404 05/28/20 2144   05/25/20 2100  cefTRIAXone (ROCEPHIN) 2 g in sodium chloride 0.9 % 100 mL IVPB        2 g 200 mL/hr over 30 Minutes Intravenous Every 24 hours 05/24/20 2126 05/28/20 2215   05/24/20 2200  azithromycin (ZITHROMAX) 500 mg in sodium chloride 0.9 % 250 mL IVPB  Status:  Discontinued        500 mg 250 mL/hr over 60 Minutes Intravenous Every 24 hours 05/24/20 2126 05/26/20 1404   05/24/20 2015  cefTRIAXone (ROCEPHIN) 2 g in sodium chloride 0.9 % 100 mL IVPB  Status:  Discontinued        2 g 200 mL/hr over 30 Minutes Intravenous Every 24 hours 05/24/20 2004 05/24/20 2128   05/24/20 2015  azithromycin (ZITHROMAX) 500 mg in sodium chloride 0.9 % 250 mL IVPB   Status:  Discontinued        500 mg 250 mL/hr over 60 Minutes Intravenous Every 24 hours 05/24/20 2004 05/24/20 2128      Subjective: Patient was seen and examined at bedside.  Overnight events noted.  Patient reports feeling better,  still on 6 L high flow nasal cannula , saturating 95%.  She was sitting on the chair,  she was encouraged out of bed to the chair.  She denies any chest pain, she reports breathing has improved after thoracocentesis.  Objective: Vitals:   05/31/20 0745 05/31/20 0751 05/31/20 0844 05/31/20 1153  BP: (!) 188/65   (!) 137/99  Pulse: 78   (!) 111  Resp: 19   (!) 25  Temp: 97.7 F (36.5 C)   97.9 F (36.6 C)  TempSrc: Oral   Oral  SpO2: 91% 93% (!) 87% 90%  Weight:      Height:        Intake/Output Summary (Last 24 hours) at 05/31/2020 1336 Last data filed at 05/31/2020 1106 Gross  per 24 hour  Intake 253 ml  Output 350 ml  Net -97 ml   Filed Weights   05/27/20 0500 05/30/20 0500 05/31/20 0600  Weight: 106.4 kg 102.5 kg 103.8 kg    Examination:  General exam: Appears calm and comfortable.  No respiratory distress. Respiratory system: Clear to auscultation .  Respiratory effort normal. Cardiovascular system: S1 & S2 heard, RRR. No JVD, murmurs, rubs, gallops or clicks. No pedal edema. Gastrointestinal system: Abdomen is nondistended, soft and nontender. No organomegaly or masses felt.  Normal bowel sounds heard. Central nervous system: Alert and oriented. No focal neurological deficits. Extremities:  No edema, No clubbing, No cyanosis. Skin: No rashes, lesions or ulcers Psychiatry: Judgement and insight appear normal. Mood & affect appropriate.     Data Reviewed: I have personally reviewed following labs and imaging studies  CBC: Recent Labs  Lab 05/24/20 1847 05/24/20 2120 05/25/20 0254 05/26/20 0505 05/27/20 0155 05/28/20 0230 05/31/20 0401  WBC 17.1*  --  17.0* 16.8* 12.6* 12.4*  --   NEUTROABS 16.4*  --   --   --   --   --    --   HGB 11.6*   < > 10.4* 9.4* 9.4* 9.8* 10.2*  HCT 40.3   < > 36.3 32.7* 31.7* 33.7* 34.4*  MCV 93.7  --  94.5 94.8 94.1 92.8  --   PLT 345  --  337 352 370 390  --    < > = values in this interval not displayed.   Basic Metabolic Panel: Recent Labs  Lab 05/26/20 0505 05/27/20 0155 05/28/20 0230 05/30/20 0807 05/31/20 0401  NA 142 144 143 138 139  K 4.0 3.8 4.1 4.4 4.5  CL 95* 93* 92* 93* 95*  CO2 39* 43* 41* 36* 36*  GLUCOSE 133* 179* 249* 166* 198*  BUN 27* 22 17 18 19   CREATININE 0.69 0.70 0.72 0.61 0.66  CALCIUM 8.5* 8.4* 8.5* 8.4* 8.5*  MG  --   --   --   --  2.3  PHOS  --   --   --   --  4.6   GFR: Estimated Creatinine Clearance: 82.4 mL/min (by C-G formula based on SCr of 0.66 mg/dL). Liver Function Tests: Recent Labs  Lab 05/25/20 0254  AST 26  ALT 26  ALKPHOS 86  BILITOT 0.6  PROT 6.4*  ALBUMIN 1.8*   No results for input(s): LIPASE, AMYLASE in the last 168 hours. No results for input(s): AMMONIA in the last 168 hours. Coagulation Profile: Recent Labs  Lab 05/24/20 2004  INR 1.3*   Cardiac Enzymes: No results for input(s): CKTOTAL, CKMB, CKMBINDEX, TROPONINI in the last 168 hours. BNP (last 3 results) No results for input(s): PROBNP in the last 8760 hours. HbA1C: No results for input(s): HGBA1C in the last 72 hours. CBG: Recent Labs  Lab 05/26/20 1948 05/26/20 2331 05/27/20 0337 05/27/20 0743 05/28/20 0748  GLUCAP 154* 189* 142* 141* 123*   Lipid Profile: No results for input(s): CHOL, HDL, LDLCALC, TRIG, CHOLHDL, LDLDIRECT in the last 72 hours. Thyroid Function Tests: No results for input(s): TSH, T4TOTAL, FREET4, T3FREE, THYROIDAB in the last 72 hours. Anemia Panel: No results for input(s): VITAMINB12, FOLATE, FERRITIN, TIBC, IRON, RETICCTPCT in the last 72 hours. Sepsis Labs: Recent Labs  Lab 05/24/20 2004 05/24/20 2335 05/25/20 0254 05/25/20 0520  LATICACIDVEN 2.3* 5.2* 3.6* 1.0    Recent Results (from the past 240 hour(s))   Respiratory Panel by RT PCR (Flu A&B, Covid) -  Nasopharyngeal Swab     Status: None   Collection Time: 05/24/20  6:47 PM   Specimen: Nasopharyngeal Swab  Result Value Ref Range Status   SARS Coronavirus 2 by RT PCR NEGATIVE NEGATIVE Final    Comment: (NOTE) SARS-CoV-2 target nucleic acids are NOT DETECTED.  The SARS-CoV-2 RNA is generally detectable in upper respiratoy specimens during the acute phase of infection. The lowest concentration of SARS-CoV-2 viral copies this assay can detect is 131 copies/mL. A negative result does not preclude SARS-Cov-2 infection and should not be used as the sole basis for treatment or other patient management decisions. A negative result may occur with  improper specimen collection/handling, submission of specimen other than nasopharyngeal swab, presence of viral mutation(s) within the areas targeted by this assay, and inadequate number of viral copies (<131 copies/mL). A negative result must be combined with clinical observations, patient history, and epidemiological information. The expected result is Negative.  Fact Sheet for Patients:  PinkCheek.be  Fact Sheet for Healthcare Providers:  GravelBags.it  This test is no t yet approved or cleared by the [place] FDA and  has been authorized for detection and/or diagnosis of SARS-CoV-2 by FDA under an Emergency Use Authorization (EUA). This EUA will remain  in effect (meaning this test can be used) for the duration of the COVID-19 declaration under Section 564(b)(1) of the Act, 21 U.S.C. section 360bbb-3(b)(1), unless the authorization is terminated or revoked sooner.     Influenza A by PCR NEGATIVE NEGATIVE Final   Influenza B by PCR NEGATIVE NEGATIVE Final    Comment: (NOTE) The Xpert Xpress SARS-CoV-2/FLU/RSV assay is intended as an aid in  the diagnosis of influenza from Nasopharyngeal swab specimens and  should not be used as  a sole basis for treatment. Nasal washings and  aspirates are unacceptable for Xpert Xpress SARS-CoV-2/FLU/RSV  testing.  Fact Sheet for Patients: PinkCheek.be  Fact Sheet for Healthcare Providers: GravelBags.it  This test is not yet approved or cleared by the [place] FDA and  has been authorized for detection and/or diagnosis of SARS-CoV-2 by  FDA under an Emergency Use Authorization (EUA). This EUA will remain  in effect (meaning this test can be used) for the duration of the  Covid-19 declaration under Section 564(b)(1) of the Act, 21  U.S.C. section 360bbb-3(b)(1), unless the authorization is  terminated or revoked. Performed at [facility], [place] [address] [place], [place] [address]   Blood Culture (routine x 2)     Status: None   Collection Time: 05/24/20  8:40 PM   Specimen: BLOOD RIGHT ARM  Result Value Ref Range Status   Specimen Description BLOOD RIGHT ARM  Final   Special Requests   Final    BOTTLES DRAWN AEROBIC AND ANAEROBIC Blood Culture adequate volume   Culture   Final    NO GROWTH 5 DAYS Performed at [facility], [place] [name] [place], [place] [address]    Report Status 05/29/2020 FINAL  Final  Urine culture     Status: Abnormal   Collection Time: 05/24/20  8:50 PM   Specimen: Urine, Clean Catch  Result Value Ref Range Status   Specimen Description URINE, CLEAN CATCH  Final   Special Requests   Final    NONE Performed at [facility], [place] [address], [place], [place] [address]    Culture MULTIPLE SPECIES PRESENT, SUGGEST RECOLLECTION (A)  Final   Report Status 05/26/2020 FINAL  Final  Blood Culture (routine  x 2)     Status: None   Collection Time: 05/24/20  9:06 PM   Specimen: BLOOD RIGHT HAND  Result Value Ref Range Status   Specimen Description BLOOD RIGHT HAND  Final   Special Requests   Final    BOTTLES DRAWN AEROBIC AND ANAEROBIC Blood Culture results may not be  optimal due to an inadequate volume of blood received in culture bottles   Culture   Final    NO GROWTH 5 DAYS Performed at [facility], [place] [address], [place], [place] [address]    Report Status 05/29/2020 FINAL  Final  Culture, body fluid-bottle     Status: None   Collection Time: 05/25/20 12:36 PM   Specimen: Pleura  Result Value Ref Range Status   Specimen Description PLEURAL LEFT  Final   Special Requests NONE  Final   Culture   Final    NO GROWTH 5 DAYS Performed at [facility] [address], [place], [place] [address]    Report Status 05/30/2020 FINAL  Final  Gram stain     Status: None   Collection Time: 05/25/20 12:36 PM   Specimen: Pleura  Result Value Ref Range Status   Specimen Description PLEURAL LEFT  Final   Special Requests NONE  Final   Gram Stain   Final    WBC PRESENT,BOTH PMN AND MONONUCLEAR RED BLOOD CELLS PRESENT NO ORGANISMS SEEN CYTOSPIN SMEAR Performed at [facility], [address], [place], [place] [address]    Report Status 05/25/2020 FINAL  Final     Radiology Studies: No results found.   Scheduled Meds: . aspirin  81 mg Oral Daily  . budesonide (PULMICORT) nebulizer solution  0.25 mg Nebulization BID  . Chlorhexidine Gluconate Cloth  6 each Topical Daily  . diltiazem  240 mg Oral Daily  . enoxaparin (LOVENOX) injection  40 mg Subcutaneous Q24H  . furosemide  40 mg Oral Daily  . ipratropium  0.5 mg Nebulization Once  . ipratropium-albuterol  3 mL Nebulization TID  . mouth rinse  15 mL Mouth Rinse BID  . methylPREDNISolone (SOLU-MEDROL) injection  40 mg Intravenous Q8H  . montelukast  10 mg Oral QHS  . sodium chloride flush  10-40 mL Intracatheter Q12H  . sodium chloride flush  3 mL Intravenous Q12H  . umeclidinium-vilanterol  1 puff Inhalation Daily   Continuous Infusions:   LOS: 7 days    Time spent: 25 mins   Libbi Towner, MD [facility]   If 7PM-7AM, please contact night-coverage

## 2020-06-01 NOTE — Progress Notes (Signed)
This chaplain was present with the Pt. for a F/U spiritual care visit.  The Pt. is sitting up in the bedside recliner.  The chaplain hears, "I am tired" from the Pt.  The Pt shared her PT successes with the chaplain. The chaplain affirmed the Pt. participation in her healthcare goal of returning home.    The chaplain understands the Pt. is waiting for her husband-Michael to arrive to share the day's journey with him.  The chaplain steps away when [name] arrives and is invited to return for another spiritual care visit.

## 2020-06-01 NOTE — Progress Notes (Signed)
Physical Therapy Treatment Patient Details Name: Deborah [name] MRN: [id number] DOB: May 28, 1956 Today's Date: 06/01/2020    History of Present Illness Pt is a 64 year old female admitted with multifactorial hypoxia including Acute on chronic respiratory failure with hypoxia 2/2 left exudative pleural effusion, left-sided pneumonia, copd w/ acute exacerbation, CAP. Pt underwent thoracentesis 11/9. PMHx including left-sided breast cancer status post lumpectomy and adjuvant chemo, left lower lobe lung cancer, chronic tachycardia , CHF with preserved EF, COPD/chronic hypoxic respiratory failure on 4 L home O2, HTN, OSA/pulmonary hypertension    PT Comments    Pt very pleasant stating she has been in the chair all day and back and forth to [facility]. Pt on 5L on arrival and abl able to walk in hall on 4L with SPO2 >90% throughout and pt on 4L at rest end of session. Pt with HR 120-140 with gait with pt with baseline tachycardia and improved pulmonary function and activity tolerance. Performed HEP and discussed walking program for home with use of her rollator to progress distance and time with gait. Pt verbalized understanding and eager to return home.     Follow Up Recommendations  Home health PT;Supervision/Assistance - 24 hour     Equipment Recommendations  None recommended by PT    Recommendations for Other Services       Precautions / Restrictions Precautions Precautions: Fall Precaution Comments: watch sats Restrictions Weight Bearing Restrictions: No    Mobility  Bed Mobility               General bed mobility comments: OOB in recliner upon entry   Transfers Overall transfer level: Needs assistance   Transfers: Sit to/from Stand Sit to Stand: Supervision         General transfer comment: supervision for lines, appropriate hand placement  Ambulation/Gait Ambulation/Gait assistance: Min guard Gait Distance (Feet): 82 Feet Assistive device: Rolling walker (2  wheeled) Gait Pattern/deviations: Step-through pattern;Decreased stride length;Trunk flexed   Gait velocity interpretation: 1.31 - 2.62 ft/sec, indicative of limited community ambulator General Gait Details: cues for posture, breathing and activity tolerance. Pt walked 38', 58', and 82' respectively with seated rest grossly 1 min between each trial. Pt initial trial on 6L and repeated 2 on 4L with sats >90% throughout. HR up to 140   Stairs             Wheelchair Mobility    Modified Rankin (Stroke Patients Only)       Balance Overall balance assessment: Mild deficits observed, not formally tested   Sitting balance-Leahy Scale: Good     Standing balance support: Bilateral upper extremity supported Standing balance-Leahy Scale: Poor Standing balance comment: reliant on external assist                             Cognition Arousal/Alertness: Awake/alert Behavior During Therapy: WFL for tasks assessed/performed Overall Cognitive Status: Within Functional Limits for tasks assessed                                        Exercises General Exercises - Lower Extremity Long Arc Quad: AROM;Both;Seated;10 reps Hip Flexion/Marching: AROM;15 reps;Seated;Both    General Comments        Pertinent Vitals/Pain Pain Assessment: No/denies pain    Home Living  Prior Function            PT Goals (current goals can now be found in the care plan section) Progress towards PT goals: Progressing toward goals    Frequency    Min 3X/week      PT Plan Current plan remains appropriate    Co-evaluation              AM-PAC PT "6 Clicks" Mobility   Outcome Measure  Help needed turning from your back to your side while in a flat bed without using bedrails?: A Little Help needed moving from lying on your back to sitting on the side of a flat bed without using bedrails?: A Little Help needed moving to and from a  bed to a chair (including a wheelchair)?: A Little Help needed standing up from a chair using your arms (e.g., wheelchair or bedside chair)?: A Little Help needed to walk in hospital room?: A Little Help needed climbing 3-5 steps with a railing? : A Lot 6 Click Score: 17    End of Session Equipment Utilized During Treatment: Oxygen;Gait belt Activity Tolerance: Patient tolerated treatment well Patient left: in chair;with call bell/phone within reach Nurse Communication: Mobility status PT Visit Diagnosis: Muscle weakness (generalized) (M62.81);Difficulty in walking, not elsewhere classified (R26.2);Other abnormalities of gait and mobility (R26.89)     Time: 9201-0071 PT Time Calculation (min) (ACUTE ONLY): 25 min  Charges:  $Gait Training: 8-22 mins $Therapeutic Activity: 8-22 mins                     Dodge Ator P, PT Acute Rehabilitation Services Pager: [phone] Office: [place] 06/01/2020, 1:56 PM

## 2020-06-01 NOTE — Progress Notes (Signed)
PROGRESS NOTE    Deborah [name]  FAO:[id number] DOB: [birth year] DOA: 05/24/2020 PCP: Pleas Koch, NP   Brief Narrative:  This 64 years old female with significant history of left sided breast cancer status post lumpectomy and adjuvant chemotherapy, left lower lobe lung cancer,  chronic tachycardia,  CHF with preserved EF, COPD, chronic hypoxic respiratory failure on 4 L supplemental oxygen at baseline, hypertension, obstructive sleep apnea presents in the ED with worsening hypoxia which has been going on for couple of weeks.  Patient has discontinued her Lasix to prevent her from getting up and going to the bathroom to prevent falling.  She was found to have left-sided pleural effusion with associated lung collapse and diffuse density representing pneumonia.  Patient underwent left thoracocentesis ,  tolerated well.  Patient was initially placed on BiPAP but weaned down to high flow nasal cannula 6 L at this point. She is on BiPAP as needed and at bedtime.   Assessment & Plan:   Principal Problem:   Acute on chronic respiratory failure with hypoxia (HCC) Active Problems:   Chronic respiratory failure (HCC)   Morbid obesity (HCC)   CRLD (chronic restrictive lung disease) secondary to super morbid obesity with mild COPD component   Pulmonary HTN (HCC)   OSA (obstructive sleep apnea)   Chronic diastolic heart failure (HCC)   COPD exacerbation (HCC)   Severe sepsis (HCC)   COPD (chronic obstructive pulmonary disease) (HCC)   Primary malignant neoplasm of bronchus of left lower lobe (HCC)   Malignant neoplasm of upper-outer quadrant of left breast in female, estrogen receptor negative ([place])   Acute hypoxemic respiratory failure ([place])   Advanced care planning/counseling discussion   Palliative care by specialist   # Acute on chronic hypoxic respiratory failure 2/2 left sided exudative pleural effusion, left-sided pneumonia, copd w/ acute exacerbation,  She was initially placed on  BiPAP in ED: on 4 L [place] at baseline: Multifactorial hypoxia also in the setting of poor tolerance to chemotherapy.  Status post thoracentesis 11/9- pleural fluid Gram stain negative, no growth so far.  Weaned down to high flow nasal cannula, 6 L now.  Completed ceftriaxone/azithromycin, continue iv steroids, bronchodilators- ADD duonebs tid, PRN nebs, cont Lasix 40 mg.  Margin on her lumpectomies are negative and will likely not receive chemo given poor tolerance in the first cycle.  Blood cultures no growth so far, urine culture contaminated,  palliative care evaluation. Pulmonary following She is slowly improving,  her oxygen requirement has improved. She is on 6 L high flow nasal cannula saturating 97%. PT and OT recommended home with home PT.  # Severe sepsis POA  Sec. to community-acquired pneumonia:  Tachycardia, tachypnea and leukocytosis improving. Culture returned no growth so far.  Completed Ceftriaxone/Azithro. 05/29/2020: Patient is currently on ceftriaxone and doxycycline. Complete course of antibiotics. Sepsis physiology improved.  Chronic diastolic CHF:  Repeat echo shows EF 55 to 60%. She reports not taking diuretics for 6 weeks  . She is back on Lasix at 40 mg.  BNP stable.  COPD with acute exacerbation/Chronic restrictive lung disease:   Due to pneumonia. Significant wheezing on admission, improved with systemic steroid, bronchodilators. Pulmonary following.  Lactic acidosis due to respiratory distress/sepsis: >>> resolved.  AKI: >>> Resolved.  -Creatinine peaked to 1.13, now at 0.6-0.7 tolerating Lasix.monitor. 05/28/2020: AKI has resolved. Serum creatinine today is 0.60  Anemia likely in the setting of chronic disease /  Iron panel normal.  Morbid obesity with BMI 42.6 outpatient weight  loss follow-up with PCP.  OSA noncompliant with CPAP, untreated: BiPAP bedtime.  Has not been using cpap at home for sometime, although has one  Primary malignant  neoplasm of bronchus of left lower lobe: Follows with Dr. Rogue Jury.   " I have been dealing with it"- she says "it is stage 3 and never will be cured and learning to live with it".  Malignant neoplasm of upper-outer quadrant of left breast in female, estrogen receptor negative,  triple negative status post lumpectomy . on adjuvant chemo with Adriamycin and Cytoxan,  had recent chemotherapy with poor tolerance, echocardiogram normal.   GOC: Patient remains full code. Palliative care input is appreciated.    DVT prophylaxis: Lovenox Code Status: Full code Family Communication: Husband was at bedside Disposition Plan:   Status is: Inpatient  Remains inpatient appropriate because:Inpatient level of care appropriate due to severity of illness   Dispo: The patient is from: Home              Anticipated d/c is to: Home with Home PT              Anticipated d/c date is: 11/17              Patient currently is not medically stable to d/c.   She is still hypoxic requiring 6 L HFNC sating 92%.   Consultants:   Interventional radiology  Procedures:  Left thoracocentesis  Antimicrobials: Anti-infectives (From admission, onward)   Start     Dose/Rate Route Frequency Ordered Stop   05/26/20 2200  doxycycline (VIBRA-TABS) tablet 100 mg        100 mg Oral Every 12 hours 05/26/20 1404 05/28/20 2144   05/25/20 2100  cefTRIAXone (ROCEPHIN) 2 g in sodium chloride 0.9 % 100 mL IVPB        2 g 200 mL/hr over 30 Minutes Intravenous Every 24 hours 05/24/20 2126 05/28/20 2215   05/24/20 2200  azithromycin (ZITHROMAX) 500 mg in sodium chloride 0.9 % 250 mL IVPB  Status:  Discontinued        500 mg 250 mL/hr over 60 Minutes Intravenous Every 24 hours 05/24/20 2126 05/26/20 1404   05/24/20 2015  cefTRIAXone (ROCEPHIN) 2 g in sodium chloride 0.9 % 100 mL IVPB  Status:  Discontinued        2 g 200 mL/hr over 30 Minutes Intravenous Every 24 hours 05/24/20 2004 05/24/20 2128   05/24/20 2015   azithromycin (ZITHROMAX) 500 mg in sodium chloride 0.9 % 250 mL IVPB  Status:  Discontinued        500 mg 250 mL/hr over 60 Minutes Intravenous Every 24 hours 05/24/20 2004 05/24/20 2128      Subjective: Patient was seen and examined at bedside.  Overnight events noted.  She is still on 6 L high flow nasal cannula , saturating 91%.  She denies any chest pain, she reports breathing has improved after thoracocentesis. She ambulated in the hallway, became severely short of breath.  Objective: Vitals:   06/01/20 0608 06/01/20 0854 06/01/20 0858 06/01/20 1143  BP: 131/72   107/67  Pulse: 71  71 83  Resp: 20  19 (!) 27  Temp: 98.2 F (36.8 C)   98.2 F (36.8 C)  TempSrc: Oral   Oral  SpO2: 97% 96% 96% 93%  Weight:      Height:        Intake/Output Summary (Last 24 hours) at 06/01/2020 1340 Last data filed at 06/01/2020 1002 Gross per 24  hour  Intake 333 ml  Output --  Net 333 ml   Filed Weights   05/30/20 0500 05/31/20 0600 06/01/20 0500  Weight: 102.5 kg 103.8 kg 102.7 kg    Examination:  General exam: Appears calm and comfortable.  No respiratory distress. Respiratory system: Clear to auscultation .  Respiratory effort normal. Cardiovascular system: S1 & S2 heard, RRR. No JVD, murmurs, rubs, gallops or clicks. No pedal edema. Gastrointestinal system: Abdomen is nondistended, soft and nontender. No organomegaly or masses felt.  Normal bowel sounds heard. Central nervous system: Alert and oriented. No focal neurological deficits. Extremities:  No edema, No clubbing, No cyanosis. Skin: No rashes, lesions or ulcers Psychiatry: Judgement and insight appear normal. Mood & affect appropriate.     Data Reviewed: I have personally reviewed following labs and imaging studies  CBC: Recent Labs  Lab 05/26/20 0505 05/27/20 0155 05/28/20 0230 05/31/20 0401  WBC 16.8* 12.6* 12.4*  --   HGB 9.4* 9.4* 9.8* 10.2*  HCT 32.7* 31.7* 33.7* 34.4*  MCV 94.8 94.1 92.8  --   PLT 352  370 390  --    Basic Metabolic Panel: Recent Labs  Lab 05/26/20 0505 05/27/20 0155 05/28/20 0230 05/30/20 0807 05/31/20 0401  NA 142 144 143 138 139  K 4.0 3.8 4.1 4.4 4.5  CL 95* 93* 92* 93* 95*  CO2 39* 43* 41* 36* 36*  GLUCOSE 133* 179* 249* 166* 198*  BUN 27* 22 17 18 19   CREATININE 0.69 0.70 0.72 0.61 0.66  CALCIUM 8.5* 8.4* 8.5* 8.4* 8.5*  MG  --   --   --   --  2.3  PHOS  --   --   --   --  4.6   GFR: Estimated Creatinine Clearance: 81.8 mL/min (by C-G formula based on SCr of 0.66 mg/dL). Liver Function Tests: No results for input(s): AST, ALT, ALKPHOS, BILITOT, PROT, ALBUMIN in the last 168 hours. No results for input(s): LIPASE, AMYLASE in the last 168 hours. No results for input(s): AMMONIA in the last 168 hours. Coagulation Profile: No results for input(s): INR, PROTIME in the last 168 hours. Cardiac Enzymes: No results for input(s): CKTOTAL, CKMB, CKMBINDEX, TROPONINI in the last 168 hours. BNP (last 3 results) No results for input(s): PROBNP in the last 8760 hours. HbA1C: No results for input(s): HGBA1C in the last 72 hours. CBG: Recent Labs  Lab 05/26/20 1948 05/26/20 2331 05/27/20 0337 05/27/20 0743 05/28/20 0748  GLUCAP 154* 189* 142* 141* 123*   Lipid Profile: No results for input(s): CHOL, HDL, LDLCALC, TRIG, CHOLHDL, LDLDIRECT in the last 72 hours. Thyroid Function Tests: No results for input(s): TSH, T4TOTAL, FREET4, T3FREE, THYROIDAB in the last 72 hours. Anemia Panel: No results for input(s): VITAMINB12, FOLATE, FERRITIN, TIBC, IRON, RETICCTPCT in the last 72 hours. Sepsis Labs: No results for input(s): PROCALCITON, LATICACIDVEN in the last 168 hours.  Recent Results (from the past 240 hour(s))  Respiratory Panel by RT PCR (Flu A&B, Covid) - Nasopharyngeal Swab     Status: None   Collection Time: 05/24/20  6:47 PM   Specimen: Nasopharyngeal Swab  Result Value Ref Range Status   SARS Coronavirus 2 by RT PCR NEGATIVE NEGATIVE Final     Comment: (NOTE) SARS-CoV-2 target nucleic acids are NOT DETECTED.  The SARS-CoV-2 RNA is generally detectable in upper respiratoy specimens during the acute phase of infection. The lowest concentration of SARS-CoV-2 viral copies this assay can detect is 131 copies/mL. A negative result does not preclude  SARS-Cov-2 infection and should not be used as the sole basis for treatment or other patient management decisions. A negative result may occur with  improper specimen collection/handling, submission of specimen other than nasopharyngeal swab, presence of viral mutation(s) within the areas targeted by this assay, and inadequate number of viral copies (<131 copies/mL). A negative result must be combined with clinical observations, patient history, and epidemiological information. The expected result is Negative.  Fact Sheet for Patients:  PinkCheek.be  Fact Sheet for Healthcare Providers:  GravelBags.it  This test is no t yet approved or cleared by the [place] FDA and  has been authorized for detection and/or diagnosis of SARS-CoV-2 by FDA under an Emergency Use Authorization (EUA). This EUA will remain  in effect (meaning this test can be used) for the duration of the COVID-19 declaration under Section 564(b)(1) of the Act, 21 U.S.C. section 360bbb-3(b)(1), unless the authorization is terminated or revoked sooner.     Influenza A by PCR NEGATIVE NEGATIVE Final   Influenza B by PCR NEGATIVE NEGATIVE Final    Comment: (NOTE) The Xpert Xpress SARS-CoV-2/FLU/RSV assay is intended as an aid in  the diagnosis of influenza from Nasopharyngeal swab specimens and  should not be used as a sole basis for treatment. Nasal washings and  aspirates are unacceptable for Xpert Xpress SARS-CoV-2/FLU/RSV  testing.  Fact Sheet for Patients: PinkCheek.be  Fact Sheet for Healthcare  Providers: GravelBags.it  This test is not yet approved or cleared by the [place] FDA and  has been authorized for detection and/or diagnosis of SARS-CoV-2 by  FDA under an Emergency Use Authorization (EUA). This EUA will remain  in effect (meaning this test can be used) for the duration of the  Covid-19 declaration under Section 564(b)(1) of the Act, 21  U.S.C. section 360bbb-3(b)(1), unless the authorization is  terminated or revoked. Performed at [facility], [place] [address], [place], [place] [address]   Blood Culture (routine x 2)     Status: None   Collection Time: 05/24/20  8:40 PM   Specimen: BLOOD RIGHT ARM  Result Value Ref Range Status   Specimen Description BLOOD RIGHT ARM  Final   Special Requests   Final    BOTTLES DRAWN AEROBIC AND ANAEROBIC Blood Culture adequate volume   Culture   Final    NO GROWTH 5 DAYS Performed at [facility], [place] [address], [place], [place] [address]    Report Status 05/29/2020 FINAL  Final  Urine culture     Status: Abnormal   Collection Time: 05/24/20  8:50 PM   Specimen: Urine, Clean Catch  Result Value Ref Range Status   Specimen Description URINE, CLEAN CATCH  Final   Special Requests   Final    NONE Performed at [facility], [place] [address], [place], [place] [address]    Culture MULTIPLE SPECIES PRESENT, SUGGEST RECOLLECTION (A)  Final   Report Status 05/26/2020 FINAL  Final  Blood Culture (routine x 2)     Status: None   Collection Time: 05/24/20  9:06 PM   Specimen: BLOOD RIGHT HAND  Result Value Ref Range Status   Specimen Description BLOOD RIGHT HAND  Final   Special Requests   Final    BOTTLES DRAWN AEROBIC AND ANAEROBIC Blood Culture results may not be optimal due to an inadequate volume of blood received in culture bottles   Culture   Final    NO GROWTH 5 DAYS Performed at [facility], [place] [address], [place], [place] [address]    Report Status 05/29/2020  FINAL  Final  Culture, body fluid-bottle     Status: None   Collection Time: 05/25/20 12:36 PM   Specimen: Pleura  Result Value Ref Range Status   Specimen Description PLEURAL LEFT  Final   Special Requests NONE  Final   Culture   Final    NO GROWTH 5 DAYS Performed at [place] [address] [place], [place] [address]    Report Status 05/30/2020 FINAL  Final  Gram stain     Status: None   Collection Time: 05/25/20 12:36 PM   Specimen: Pleura  Result Value Ref Range Status   Specimen Description PLEURAL LEFT  Final   Special Requests NONE  Final   Gram Stain   Final    WBC PRESENT,BOTH PMN AND MONONUCLEAR RED BLOOD CELLS PRESENT NO ORGANISMS SEEN CYTOSPIN SMEAR Performed at [facility], [address] [place], [place] [address]    Report Status 05/25/2020 FINAL  Final     Radiology Studies: No results found.   Scheduled Meds: . aspirin  81 mg Oral Daily  . budesonide (PULMICORT) nebulizer solution  0.25 mg Nebulization BID  . Chlorhexidine Gluconate Cloth  6 each Topical Daily  . diltiazem  240 mg Oral Daily  . enoxaparin (LOVENOX) injection  40 mg Subcutaneous Q24H  . furosemide  40 mg Oral Daily  . ipratropium  0.5 mg Nebulization Once  . ipratropium-albuterol  3 mL Nebulization TID  . mouth rinse  15 mL Mouth Rinse BID  . methylPREDNISolone (SOLU-MEDROL) injection  40 mg Intravenous Q8H  . montelukast  10 mg Oral QHS  . sodium chloride flush  10-40 mL Intracatheter Q12H  . sodium chloride flush  3 mL Intravenous Q12H  . umeclidinium-vilanterol  1 puff Inhalation Daily   Continuous Infusions:   LOS: 8 days    Time spent: 25 mins   Maleya Leever, MD [facility]   If 7PM-7AM, please contact night-coverage

## 2020-06-02 LAB — HEMOGLOBIN AND HEMATOCRIT, BLOOD
HCT: 33.6 % — ABNORMAL LOW (ref 36.0–46.0)
Hemoglobin: 10 g/dL — ABNORMAL LOW (ref 12.0–15.0)

## 2020-06-02 LAB — BASIC METABOLIC PANEL
Anion gap: 6 (ref 5–15)
BUN: 18 mg/dL (ref 8–23)
CO2: 35 mmol/L — ABNORMAL HIGH (ref 22–32)
Calcium: 8.4 mg/dL — ABNORMAL LOW (ref 8.9–10.3)
Chloride: 98 mmol/L (ref 98–111)
Creatinine, Ser: 0.76 mg/dL (ref 0.44–1.00)
GFR, Estimated: >60 mL/min (ref >60)
Glucose, Bld: 205 mg/dL — ABNORMAL HIGH (ref 70–99)
Potassium: 4 mmol/L (ref 3.5–5.1)
Sodium: 139 mmol/L (ref 135–145)

## 2020-06-02 MED ORDER — METHYLPREDNISOLONE 4 MG PO TBPK: ORAL_TABLET | ORAL | 0 refills | Status: DC

## 2020-06-02 MED ORDER — HEPARIN SOD (PORK) LOCK FLUSH 100 UNIT/ML IV SOLN
500.0000 [IU] | INTRAVENOUS | Status: AC | PRN
  Administered 2020-06-02: 500 [IU]
  Filled 2020-06-02: qty 5

## 2020-06-02 NOTE — Discharge Summary (Addendum)
Physician Discharge Summary  Deborah [name] ZOX:[id number] DOB: 22-Apr-1956 DOA: 05/24/2020  PCP: Pleas Koch, NP  Admit date: 05/24/2020   Discharge date: 06/02/2020  Admitted From: Home.  Disposition: Home with home services (PT/OT)  Recommendations for Outpatient Follow-up:  1. Follow up with PCP in 1-2 weeks. 2. Please obtain BMP/CBC in one week. 3. Advised to take Medrol pack as directed. 4. Advised to continue Lasix 40 mg daily as advised. 5. Home health services been arranged for patient.  Home Health: Home PT and OT.  Equipment/Devices: Oxygen at 4 L/min  Discharge Condition: Stable CODE STATUS:Full code Diet recommendation: [place] Hospital course: This 64 years old female with medical history significant for left sided breast cancer status post lumpectomy and adjuvant chemotherapy, left lower lobe lung cancer, chronic tachycardia,  CHF with preserved EF, COPD, chronic hypoxic respiratory failure on 4 L supplemental oxygen at baseline, hypertension, obstructive sleep apnea presents in the ED with worsening hypoxia which has been going on for couple of weeks.  Patient has discontinued her Lasix to prevent her from getting up and going to the bathroom to prevent falling.  She was found to have left-sided pleural effusion with associated lung collapse and diffuse density representing pneumonia on CT chest.  Patient underwent left thoracocentesis ,  tolerated well. She has completed antibiotics for 5 days. Patient was initially placed on BiPAP but weaned down to high flow nasal cannula 12 L eventually down to 4 L at this point. She required BiPAP as needed and at bedtime. Patient reports feeling better,  she has ambulated well on 4 L supplemental oxygenation and saturation remained above 90%. Patient is being discharged home. Home health services been arranged. Patient was encouraged to use CPAP at night.  She was managed for below  problems.  Discharge Diagnoses:  Principal Problem:   Acute on chronic respiratory failure with hypoxia (HCC) Active Problems:   Chronic respiratory failure (HCC)   Morbid obesity (HCC)   CRLD (chronic restrictive lung disease) secondary to super morbid obesity with mild COPD component   Pulmonary HTN (HCC)   OSA (obstructive sleep apnea)   Chronic diastolic heart failure (HCC)   COPD exacerbation (HCC)   Severe sepsis (HCC)   COPD (chronic obstructive pulmonary disease) (HCC)   Primary malignant neoplasm of bronchus of left lower lobe (HCC)   Malignant neoplasm of upper-outer quadrant of left breast in female, estrogen receptor negative ([place])   Acute hypoxemic respiratory failure ([place])   Advanced care planning/counseling discussion   Palliative care by specialist  # Acute on chronic hypoxic respiratory failure 2/2 left sided exudative pleural effusion, left-sided pneumonia, copd w/ acute exacerbation,  She was initially placed on BiPAP in ED:on 4 L [place] at baseline: Multifactorial hypoxia also in the setting of poor tolerance to chemotherapy.  Status post thoracentesis on 11/9- pleural fluid Gram stain negative, no growth so far.  Weaned down to high flow nasal cannula, 4 L now.  Completed ceftriaxone /azithromycin x 5 days , continued iv steroids, bronchodilators- ADD duonebs tid, PRN nebs, cont Lasix 40 mg.  Margin on her lumpectomies are negative and will likely not receive chemo given poor tolerance in the first cycle.  Blood cultures : no growth so far, urine culture contaminated,  palliative care evaluation. Pulmonary following She is slowly improving,  her oxygen requirement has improved. She is on 4 L high flow nasal cannula saturating 97%. PT and OT recommended home with home PT.  #  Severe sepsis POA  Sec. to community-acquired pneumonia:  Tachycardia, tachypnea and leukocytosis improving. Culture returned no growth so far.  Patient is currently on ceftriaxone and  doxycycline. Complete course of antibiotics. Sepsis physiology improved.  Chronic diastolic CHF:  Repeat echo shows EF 55 to 60%. She reports not taking diuretics for 6 weeks . She is back on Lasix at 40 mg. BNP stable.  COPD with acute exacerbation/Chronic restrictive lung disease:  Due to pneumonia. Significant wheezing on admission, improved with systemic steroid, bronchodilators. Pulmonary following.  Lactic acidosisdue to respiratory distress/sepsis: >>> resolved.  AKI: >>> Resolved.  -Creatinine peaked to 1.13, now at 0.6-0.7 tolerating Lasix.monitor. 05/28/2020: AKI has resolved. Serum creatinine today is 0.60  Anemia likely in the setting of chronic disease /  Iron panel normal.  Morbid obesity with BMI 42.6outpatient weight loss follow-up with PCP.  OSA noncompliant with CPAP, untreated: BiPAP bedtime. Has not been using cpap at home for sometime, although has one  Primary malignant neoplasm of bronchus of left lower lobe: Follows with Dr. Rogue Jury.   " I have been dealing with it"- she says "it is stage 3 and never will be cured and learning to live with it".  Malignant neoplasm of upper-outer quadrant of left breast in female, estrogen receptor negative,  triple negative status post lumpectomy . on adjuvant chemo with Adriamycin and Cytoxan,  had recent chemotherapy with poor tolerance, echocardiogram normal.   GOC: Patient remains full code. Palliative care input is appreciated.   Discharge Instructions  Discharge Instructions    Call MD for:  difficulty breathing, headache or visual disturbances   Complete by: As directed    Call MD for:  persistant dizziness or light-headedness   Complete by: As directed    Call MD for:  persistant nausea and vomiting   Complete by: As directed    Diet - low sodium heart healthy   Complete by: As directed    Diet Carb Modified   Complete by: As directed    Discharge instructions   Complete by: As directed     Advised to follow-up with primary care physician in 1 week. Advised to take Medrol pack as directed. Advised to continue Lasix 40 mg daily is advised. Home health services been arranged for patient.   Increase activity slowly   Complete by: As directed      Allergies as of 06/02/2020   No Known Allergies     Medication List    TAKE these medications   acetaminophen 325 MG tablet Commonly known as: TYLENOL Take 650 mg by mouth every 6 (six) hours as needed for headache (pain).   AeroChamber MV inhaler Use as instructed   albuterol 108 (90 Base) MCG/ACT inhaler Commonly known as: VENTOLIN HFA Inhale 2 puffs into the lungs every 6 (six) hours as needed for wheezing or shortness of breath.   aspirin 81 MG chewable tablet Chew 1 tablet (81 mg total) by mouth daily.   BC FAST PAIN RELIEF ARTHRITIS PO Take 1 packet by mouth daily as needed (back pain).   Breztri Aerosphere 160-9-4.8 MCG/ACT Aero Generic drug: Budeson-Glycopyrrol-Formoterol Inhale 2 puffs into the lungs daily. What changed:   when to take this  reasons to take this   diltiazem 240 MG 24 hr capsule Commonly known as: CARDIZEM CD TAKE 1 CAPSULE BY MOUTH EVERY DAY What changed: how much to take   furosemide 20 MG tablet Commonly known as: LASIX TAKE 2 TABLETS BY MOUTH EVERY DAY   ipratropium-albuterol  0.5-2.5 (3) MG/3ML Soln Commonly known as: DUONEB Take 3 mLs by nebulization every 3 (three) hours as needed. What changed: reasons to take this   lidocaine-prilocaine cream Commonly known as: EMLA Apply 1 application topically as needed (prior to port access).   methylPREDNISolone 4 MG Tbpk tablet Commonly known as: MEDROL DOSEPAK Take it as directed.   montelukast 10 MG tablet Commonly known as: SINGULAIR Take 1 tablet (10 mg total) by mouth at bedtime.   OXYGEN Inhale 4 L/min into the lungs continuous.   potassium chloride SA 20 MEQ tablet Commonly known as: Klor-Con M20 TAKE 1/2 TABLET BY  MOUTH EVERY DAY What changed:   how much to take  how to take this  when to take this  additional instructions   traMADol 50 MG tablet Commonly known as: Ultram Take 1 tablet (50 mg total) by mouth every 6 (six) hours as needed for moderate pain or severe pain.            Durable Medical Equipment  (From admission, onward)         Start     Ordered   06/02/20 1121  DME Oxygen  Once       Question Answer Comment  Length of Need Lifetime   Mode or (Route) Nasal cannula   Liters per Minute 4   Frequency Continuous (stationary and portable oxygen unit needed)   Oxygen conserving device Yes   Oxygen delivery system Gas      06/02/20 1121          Follow-up Information    Pleas Koch, NP Follow up in 1 week(s).   Specialty: Internal Medicine Contact information: [place] [address] [phone]        [name], MD .   Specialty: Cardiology Contact information: [place] [address] [phone]              No Known Allergies  Consultations:  PCCM   Procedures/Studies: DG Chest Port 1 View  Result Date: 05/26/2020 CLINICAL DATA:  Pneumonia EXAM: PORTABLE CHEST 1 VIEW COMPARISON:  Yesterday FINDINGS: Unchanged extensive opacification of the left chest with volume loss. Relatively clear right lung although there are bronchograms at the medial right base. Stable heart size accentuated by mediastinal fat based on most recent CT. Porta catheter on the right with tip at the upper right atrium. IMPRESSION: Stable left more than right infiltrates. Electronically Signed   By: Monte Fantasia M.D.   On: 05/26/2020 07:57   DG Chest Port 1 View  Result Date: 05/25/2020 CLINICAL DATA:  Status post thoracentesis EXAM: PORTABLE CHEST 1 VIEW COMPARISON:  05/24/2020 FINDINGS: Cardiac shadow is stable. Aortic calcifications are again seen. Slight reduction in left pleural effusion is noted without  evidence of pneumothorax. Patchy infiltrate remains in the left lung. Right chest wall port is again seen. IMPRESSION: No pneumothorax following thoracentesis. Persistent left-sided infiltrate is noted. Electronically Signed   By: Inez Catalina M.D.   On: 05/25/2020 13:20   DG Chest Port 1 View  Result Date: 05/24/2020 CLINICAL DATA:  64 year old female with shortness of breath. EXAM: PORTABLE CHEST 1 VIEW COMPARISON:  Chest radiograph dated 04/19/2018 and CT dated 01/26/2020. FINDINGS: Right-sided Port-A-Cath with tip close to the cavoatrial junction. There is small to moderate left pleural effusion with collapse of the majority of the left lung. There is diffuse interstitial and streaky density in the aerated portion of the left  lung which may represent atelectasis, asymmetric edema, or pneumonia. Clinical correlation is recommended. There is background of emphysema. Faint right lung base reticulonodular densities, likely atelectasis. There is no pneumothorax. Stable cardiomediastinal silhouette. Atherosclerotic calcification of the aorta. No acute osseous pathology. IMPRESSION: 1. Small to moderate left pleural effusion with collapse of the majority of the left lung. 2. Diffuse interstitial and streaky density in the aerated portion of the left lung may represent atelectasis, asymmetric edema, or pneumonia. Electronically Signed   By: Anner Crete M.D.   On: 05/24/2020 17:59   ECHOCARDIOGRAM COMPLETE  Result Date: 05/25/2020    ECHOCARDIOGRAM REPORT   Patient Name:   Deborah [name] Date of Exam: 05/25/2020 Medical Rec #:  [id number]        Height:       62.0 in Accession #:    [id number]       Weight:       238.1 lb Date of Birth:  August 12, 1955       BSA:          2.059 m Patient Age:    29 years         BP:           108/61 mmHg Patient Gender: F                HR:           119 bpm. Exam Location:  Inpatient Procedure: 2D Echo Indications:    Dyspnea R06.00  History:        Patient has prior history  of Echocardiogram examinations, most                 recent 01/27/2020. CHF, COPD; Risk Factors:Hypertension and                 Former Smoker.  Sonographer:    Mikki Santee RDCS (AE) Referring Phys: [id number] [place]  1. Left ventricular ejection fraction, by estimation, is 55 to 60%. The left ventricle has normal function. The left ventricle has no regional wall motion abnormalities. Indeterminate diastolic filling due to E-A fusion.  2. Right ventricular systolic function is normal. The right ventricular size is normal. There is normal pulmonary artery systolic pressure. The estimated right ventricular systolic pressure is 16.0 mmHg.  3. The mitral valve is normal in structure. No evidence of mitral valve regurgitation. No evidence of mitral stenosis.  4. The aortic valve is normal in structure. Aortic valve regurgitation is not visualized. No aortic stenosis is present.  5. The inferior vena cava is normal in size with greater than 50% respiratory variability, suggesting right atrial pressure of 3 mmHg. Comparison(s): No significant change from prior study. Prior images reviewed side by side. FINDINGS  Left Ventricle: Left ventricular ejection fraction, by estimation, is 55 to 60%. The left ventricle has normal function. The left ventricle has no regional wall motion abnormalities. The left ventricular internal cavity size was normal in size. There is  no left ventricular hypertrophy. Indeterminate diastolic filling due to E-A fusion. (better information may be obtained when heart rate is normal). Right Ventricle: The right ventricular size is normal. No increase in right ventricular wall thickness. Right ventricular systolic function is normal. There is normal pulmonary artery systolic pressure. The tricuspid regurgitant velocity is 2.83 m/s, and  with an assumed right atrial pressure of 3 mmHg, the estimated right ventricular systolic pressure is 10.9 mmHg. Left Atrium: Left atrial  size was normal in size.  Right Atrium: Right atrial size was normal in size. Pericardium: There is no evidence of pericardial effusion. Mitral Valve: The mitral valve is normal in structure. No evidence of mitral valve regurgitation. No evidence of mitral valve stenosis. Tricuspid Valve: The tricuspid valve is normal in structure. Tricuspid valve regurgitation is mild . No evidence of tricuspid stenosis. Aortic Valve: The aortic valve is normal in structure. Aortic valve regurgitation is not visualized. No aortic stenosis is present. Pulmonic Valve: The pulmonic valve was normal in structure. Pulmonic valve regurgitation is not visualized. No evidence of pulmonic stenosis. Aorta: The aortic root is normal in size and structure. Venous: The inferior vena cava is normal in size with greater than 50% respiratory variability, suggesting right atrial pressure of 3 mmHg. IAS/Shunts: No atrial level shunt detected by color flow Doppler.  LEFT VENTRICLE PLAX 2D LVIDd:         4.20 cm LVIDs:         2.70 cm LV PW:         1.00 cm LV IVS:        1.00 cm LVOT diam:     2.10 cm LV SV:         57 LV SV Index:   28 LVOT Area:     3.46 cm  RIGHT VENTRICLE RV S prime:     19.70 cm/s TAPSE (M-mode): 1.6 cm LEFT ATRIUM           Index       RIGHT ATRIUM           Index LA diam:      2.90 cm 1.41 cm/m  RA Area:     14.00 cm LA Vol (A4C): 47.0 ml 22.83 ml/m RA Volume:   31.80 ml  15.45 ml/m  AORTIC VALVE LVOT Vmax:   105.00 cm/s LVOT Vmean:  74.300 cm/s LVOT VTI:    0.166 m  AORTA Ao Root diam: 2.70 cm TRICUSPID VALVE TR Peak grad:   32.0 mmHg TR Vmax:        283.00 cm/s  SHUNTS Systemic VTI:  0.17 m Systemic Diam: 2.10 cm Dani Gobble Croitoru MD Electronically signed by Sanda Klein MD Signature Date/Time: 05/25/2020/3:58:39 PM    Final    IR THORACENTESIS ASP PLEURAL SPACE W/IMG GUIDE  Result Date: 05/25/2020 INDICATION: Patient with history of lung cancer, breast cancer with emphysema and chronic hypoxic respiratory failure  found to have a left-sided pleural effusion. Request is for therapeutic and diagnostic left-sided thoracentesis EXAM: ULTRASOUND GUIDED THERAPEUTIC AND DIAGNOSTIC THORACENTESIS MEDICATIONS: LIDOCAINE 1% 10 ML COMPLICATIONS: None immediate. PROCEDURE: An ultrasound guided thoracentesis was thoroughly discussed with the patient and questions answered. The benefits, risks, alternatives and complications were also discussed. The patient understands and wishes to proceed with the procedure. Written consent was obtained. Ultrasound was performed to localize and mark an adequate pocket of fluid in the left chest. The area was then prepped and draped in the normal sterile fashion. 1% Lidocaine was used for local anesthesia. Under ultrasound guidance a 6 Fr Safe-T-Centesis catheter was introduced. Thoracentesis was performed. The catheter was removed and a dressing applied. The patient unable to maintain position and the procedure was aborted. FINDINGS: A total of approximately 520 mL of straw-colored fluid was removed. Samples were sent to the laboratory as requested by the clinical team. IMPRESSION: Successful ultrasound guided therapeutic and diagnostic left-sided thoracentesis yielding 520 mL of pleural fluid. Read by: Rushie Nyhan, NP Electronically Signed   By: Jacqulynn Cadet  M.D.   On: 05/25/2020 13:42    Left-sided thoracocentesis   Subjective: Patient was seen and examined at bedside.  Overnight events noted,  Patient reports feeling much better.  She ambulated well on 4 L of supplemental oxygen with O2 saturation above 90%.  She feels strong and wants to be discharged home.  Home services been arranged.  Discharge Exam: Vitals:   06/02/20 0715 06/02/20 0803  BP: 111/67   Pulse: (!) 121 85  Resp: 18 18  Temp: 97.7 F (36.5 C)   SpO2: 95% 92%   Vitals:   06/02/20 0500 06/02/20 0510 06/02/20 0715 06/02/20 0803  BP:   111/67   Pulse:   (!) 121 85  Resp:   18 18  Temp:   97.7 F (36.5  C)   TempSrc:   Oral   SpO2:  95% 95% 92%  Weight: 102.8 kg     Height:        General: Pt is alert, awake, not in acute distress Cardiovascular: RRR, S1/S2 +, no rubs, no gallops Respiratory:  Chemo port noted, no signs of infection. CTA bilaterally, no wheezing, no rhonchi Abdominal: Soft, NT, ND, bowel sounds + Extremities: no edema, no cyanosis    The results of significant diagnostics from this hospitalization (including imaging, microbiology, ancillary and laboratory) are listed below for reference.     Microbiology: Recent Results (from the past 240 hour(s))  Respiratory Panel by RT PCR (Flu A&B, Covid) - Nasopharyngeal Swab     Status: None   Collection Time: 05/24/20  6:47 PM   Specimen: Nasopharyngeal Swab  Result Value Ref Range Status   SARS Coronavirus 2 by RT PCR NEGATIVE NEGATIVE Final    Comment: (NOTE) SARS-CoV-2 target nucleic acids are NOT DETECTED.  The SARS-CoV-2 RNA is generally detectable in upper respiratoy specimens during the acute phase of infection. The lowest concentration of SARS-CoV-2 viral copies this assay can detect is 131 copies/mL. A negative result does not preclude SARS-Cov-2 infection and should not be used as the sole basis for treatment or other patient management decisions. A negative result may occur with  improper specimen collection/handling, submission of specimen other than nasopharyngeal swab, presence of viral mutation(s) within the areas targeted by this assay, and inadequate number of viral copies (<131 copies/mL). A negative result must be combined with clinical observations, patient history, and epidemiological information. The expected result is Negative.  Fact Sheet for Patients:  PinkCheek.be  Fact Sheet for Healthcare Providers:  GravelBags.it  This test is no t yet approved or cleared by the [place] FDA and  has been authorized for detection and/or  diagnosis of SARS-CoV-2 by FDA under an Emergency Use Authorization (EUA). This EUA will remain  in effect (meaning this test can be used) for the duration of the COVID-19 declaration under Section 564(b)(1) of the Act, 21 U.S.C. section 360bbb-3(b)(1), unless the authorization is terminated or revoked sooner.     Influenza A by PCR NEGATIVE NEGATIVE Final   Influenza B by PCR NEGATIVE NEGATIVE Final    Comment: (NOTE) The Xpert Xpress SARS-CoV-2/FLU/RSV assay is intended as an aid in  the diagnosis of influenza from Nasopharyngeal swab specimens and  should not be used as a sole basis for treatment. Nasal washings and  aspirates are unacceptable for Xpert Xpress SARS-CoV-2/FLU/RSV  testing.  Fact Sheet for Patients: PinkCheek.be  Fact Sheet for Healthcare Providers: GravelBags.it  This test is not yet approved or cleared by the [place] and  has been authorized for detection and/or diagnosis of SARS-CoV-2 by  FDA under an Emergency Use Authorization (EUA). This EUA will remain  in effect (meaning this test can be used) for the duration of the  Covid-19 declaration under Section 564(b)(1) of the Act, 21  U.S.C. section 360bbb-3(b)(1), unless the authorization is  terminated or revoked. Performed at [facility], [place] [address] [place], [place] [address]   Blood Culture (routine x 2)     Status: None   Collection Time: 05/24/20  8:40 PM   Specimen: BLOOD RIGHT ARM  Result Value Ref Range Status   Specimen Description BLOOD RIGHT ARM  Final   Special Requests   Final    BOTTLES DRAWN AEROBIC AND ANAEROBIC Blood Culture adequate volume   Culture   Final    NO GROWTH 5 DAYS Performed at [facility], [place] [address] [place], [place] [address]    Report Status 05/29/2020 FINAL  Final  Urine culture     Status: Abnormal   Collection Time: 05/24/20  8:50 PM   Specimen: Urine, Clean Catch  Result  Value Ref Range Status   Specimen Description URINE, CLEAN CATCH  Final   Special Requests   Final    NONE Performed at [facility], [place] [address] [place], [place] [address]    Culture MULTIPLE SPECIES PRESENT, SUGGEST RECOLLECTION (A)  Final   Report Status 05/26/2020 FINAL  Final  Blood Culture (routine x 2)     Status: None   Collection Time: 05/24/20  9:06 PM   Specimen: BLOOD RIGHT HAND  Result Value Ref Range Status   Specimen Description BLOOD RIGHT HAND  Final   Special Requests   Final    BOTTLES DRAWN AEROBIC AND ANAEROBIC Blood Culture results may not be optimal due to an inadequate volume of blood received in culture bottles   Culture   Final    NO GROWTH 5 DAYS Performed at [facility], [place] [address] [place], [place] [address]    Report Status 05/29/2020 FINAL  Final  Culture, body fluid-bottle     Status: None   Collection Time: 05/25/20 12:36 PM   Specimen: Pleura  Result Value Ref Range Status   Specimen Description PLEURAL LEFT  Final   Special Requests NONE  Final   Culture   Final    NO GROWTH 5 DAYS Performed at [facility] [address], [place], [place] [address]    Report Status 05/30/2020 FINAL  Final  Gram stain     Status: None   Collection Time: 05/25/20 12:36 PM   Specimen: Pleura  Result Value Ref Range Status   Specimen Description PLEURAL LEFT  Final   Special Requests NONE  Final   Gram Stain   Final    WBC PRESENT,BOTH PMN AND MONONUCLEAR RED BLOOD CELLS PRESENT NO ORGANISMS SEEN CYTOSPIN SMEAR Performed at [facility], [address] [place], [place] [address]    Report Status 05/25/2020 FINAL  Final     Labs: BNP (last 3 results) Recent Labs    05/24/20 1847 05/26/20 0505  BNP 198.6* 027.7*   Basic Metabolic Panel: Recent Labs  Lab 05/27/20 0155 05/28/20 0230 05/30/20 0807 05/31/20 0401 06/02/20 0500  NA 144 143 138 139 139  K 3.8 4.1 4.4 4.5 4.0  CL 93* 92* 93* 95* 98  CO2 43* 41*  36* 36* 35*  GLUCOSE 179* 249* 166* 198* 205*  BUN 22 17 18 19 18   CREATININE 0.70 0.72 0.61 0.66 0.76  CALCIUM 8.4* 8.5* 8.4* 8.5* 8.4*  MG  --   --   --  2.3  --   PHOS  --   --   --  4.6  --    Liver Function Tests: No results for input(s): AST, ALT, ALKPHOS, BILITOT, PROT, ALBUMIN in the last 168 hours. No results for input(s): LIPASE, AMYLASE in the last 168 hours. No results for input(s): AMMONIA in the last 168 hours. CBC: Recent Labs  Lab 05/27/20 0155 05/28/20 0230 05/31/20 0401 06/02/20 0500  WBC 12.6* 12.4*  --   --   HGB 9.4* 9.8* 10.2* 10.0*  HCT 31.7* 33.7* 34.4* 33.6*  MCV 94.1 92.8  --   --   PLT 370 390  --   --    Cardiac Enzymes: No results for input(s): CKTOTAL, CKMB, CKMBINDEX, TROPONINI in the last 168 hours. BNP: Invalid input(s): POCBNP CBG: Recent Labs  Lab 05/26/20 1948 05/26/20 2331 05/27/20 0337 05/27/20 0743 05/28/20 0748  GLUCAP 154* 189* 142* 141* 123*   D-Dimer No results for input(s): DDIMER in the last 72 hours. Hgb A1c No results for input(s): HGBA1C in the last 72 hours. Lipid Profile No results for input(s): CHOL, HDL, LDLCALC, TRIG, CHOLHDL, LDLDIRECT in the last 72 hours. Thyroid function studies No results for input(s): TSH, T4TOTAL, T3FREE, THYROIDAB in the last 72 hours.  Invalid input(s): FREET3 Anemia work up No results for input(s): VITAMINB12, FOLATE, FERRITIN, TIBC, IRON, RETICCTPCT in the last 72 hours. Urinalysis    Component Value Date/Time   COLORURINE AMBER (A) 05/24/2020 2050   APPEARANCEUR HAZY (A) 05/24/2020 2050   LABSPEC 1.012 05/24/2020 2050   PHURINE 5.0 05/24/2020 2050   GLUCOSEU NEGATIVE 05/24/2020 2050   HGBUR SMALL (A) 05/24/2020 2050   [place] NEGATIVE 05/24/2020 2050   KETONESUR NEGATIVE 05/24/2020 2050   PROTEINUR 30 (A) 05/24/2020 2050   UROBILINOGEN 0.2 10/04/2010 2351   NITRITE NEGATIVE 05/24/2020 2050   LEUKOCYTESUR NEGATIVE 05/24/2020 2050   Sepsis Labs Invalid input(s):  PROCALCITONIN,  WBC,  LACTICIDVEN Microbiology Recent Results (from the past 240 hour(s))  Respiratory Panel by RT PCR (Flu A&B, Covid) - Nasopharyngeal Swab     Status: None   Collection Time: 05/24/20  6:47 PM   Specimen: Nasopharyngeal Swab  Result Value Ref Range Status   SARS Coronavirus 2 by RT PCR NEGATIVE NEGATIVE Final    Comment: (NOTE) SARS-CoV-2 target nucleic acids are NOT DETECTED.  The SARS-CoV-2 RNA is generally detectable in upper respiratoy specimens during the acute phase of infection. The lowest concentration of SARS-CoV-2 viral copies this assay can detect is 131 copies/mL. A negative result does not preclude SARS-Cov-2 infection and should not be used as the sole basis for treatment or other patient management decisions. A negative result may occur with  improper specimen collection/handling, submission of specimen other than nasopharyngeal swab, presence of viral mutation(s) within the areas targeted by this assay, and inadequate number of viral copies (<131 copies/mL). A negative result must be combined with clinical observations, patient history, and epidemiological information. The expected result is Negative.  Fact Sheet for Patients:  PinkCheek.be  Fact Sheet for Healthcare Providers:  GravelBags.it  This test is no t yet approved or cleared by the [place] FDA and  has been authorized for detection and/or diagnosis of SARS-CoV-2 by FDA under an Emergency Use Authorization (EUA). This EUA will remain  in effect (meaning this  test can be used) for the duration of the COVID-19 declaration under Section 564(b)(1) of the Act, 21 U.S.C. section 360bbb-3(b)(1), unless the authorization is terminated or revoked sooner.     Influenza A by PCR NEGATIVE NEGATIVE Final   Influenza B by PCR NEGATIVE NEGATIVE Final    Comment: (NOTE) The Xpert Xpress SARS-CoV-2/FLU/RSV assay is intended as an aid in   the diagnosis of influenza from Nasopharyngeal swab specimens and  should not be used as a sole basis for treatment. Nasal washings and  aspirates are unacceptable for Xpert Xpress SARS-CoV-2/FLU/RSV  testing.  Fact Sheet for Patients: PinkCheek.be  Fact Sheet for Healthcare Providers: GravelBags.it  This test is not yet approved or cleared by the [place] FDA and  has been authorized for detection and/or diagnosis of SARS-CoV-2 by  FDA under an Emergency Use Authorization (EUA). This EUA will remain  in effect (meaning this test can be used) for the duration of the  Covid-19 declaration under Section 564(b)(1) of the Act, 21  U.S.C. section 360bbb-3(b)(1), unless the authorization is  terminated or revoked. Performed at [facility], [place] [address] [place], [place] [address]   Blood Culture (routine x 2)     Status: None   Collection Time: 05/24/20  8:40 PM   Specimen: BLOOD RIGHT ARM  Result Value Ref Range Status   Specimen Description BLOOD RIGHT ARM  Final   Special Requests   Final    BOTTLES DRAWN AEROBIC AND ANAEROBIC Blood Culture adequate volume   Culture   Final    NO GROWTH 5 DAYS Performed at [facility], [place] [address], [place], [place] [address]    Report Status 05/29/2020 FINAL  Final  Urine culture     Status: Abnormal   Collection Time: 05/24/20  8:50 PM   Specimen: Urine, Clean Catch  Result Value Ref Range Status   Specimen Description URINE, CLEAN CATCH  Final   Special Requests   Final    NONE Performed at [facility], [place] [address] [place], [place] [address]    Culture MULTIPLE SPECIES PRESENT, SUGGEST RECOLLECTION (A)  Final   Report Status 05/26/2020 FINAL  Final  Blood Culture (routine x 2)     Status: None   Collection Time: 05/24/20  9:06 PM   Specimen: BLOOD RIGHT HAND  Result Value Ref Range Status   Specimen Description BLOOD RIGHT HAND  Final    Special Requests   Final    BOTTLES DRAWN AEROBIC AND ANAEROBIC Blood Culture results may not be optimal due to an inadequate volume of blood received in culture bottles   Culture   Final    NO GROWTH 5 DAYS Performed at [facility], [place] [address], [place], [place] [address]    Report Status 05/29/2020 FINAL  Final  Culture, body fluid-bottle     Status: None   Collection Time: 05/25/20 12:36 PM   Specimen: Pleura  Result Value Ref Range Status   Specimen Description PLEURAL LEFT  Final   Special Requests NONE  Final   Culture   Final    NO GROWTH 5 DAYS Performed at [place] [address], [place], [place] [address]    Report Status 05/30/2020 FINAL  Final  Gram stain     Status: None   Collection Time: 05/25/20 12:36 PM   Specimen: Pleura  Result Value Ref Range Status   Specimen Description PLEURAL LEFT  Final  Special Requests NONE  Final   Gram Stain   Final    WBC PRESENT,BOTH PMN AND MONONUCLEAR RED BLOOD CELLS PRESENT NO ORGANISMS SEEN CYTOSPIN SMEAR Performed at [facility], [address], [place], [place] [address]    Report Status 05/25/2020 FINAL  Final     Time coordinating discharge: Over 30 minutes  SIGNED:   Shawna Clamp, MD  [facility] 06/02/2020, 11:21 AM Pager   If 7PM-7AM, please contact night-coverage [email or web address]

## 2020-06-02 NOTE — Discharge Instructions (Signed)
Advised to follow-up with primary care physician in 1 week. Advised to take Medrol pack as directed. Advised to continue Lasix 40 mg daily is advised. Home health services been arranged for patient.

## 2020-06-02 NOTE — TOC Transition Note (Addendum)
Transition of Care [facility]) - CM/SW Discharge Note   Patient Details  Name: Deborah [name] MRN: [id number] Date of Birth: [birth year]  [facility] Care [facility]) CM/SW Contact:  Angelita Ingles, RN Phone Number: [phone]  06/02/2020, 1:01 PM   Clinical Narrative:    CM consulted for [facility]. No agency will accept patients insurance. CM at bedside to make patient aware. Pt. States that she really does not need or want home health because her and her husband have things figured out for her to move around more. Patient states that she will be fine with no home health. CM verified that patient has wheelchair, walker & rollator. 3 in 1 has been ordered.  MD has been updated and made aware. No further needs noted at this time.    Final next level of care: [place] Barriers to Discharge: No [place] will accept this patient   Patient Goals and CMS Choice Patient states their goals for this hospitalization and ongoing recovery are:: Ready to go home CMS Medicare.gov Compare Post Acute Care list provided to:: Patient Choice offered to / list presented to : Patient  Discharge Placement                       Discharge Plan and Services                DME Arranged: 3-N-1 DME Agency: [employer] Date DME Agency Contacted: 06/02/20 Time DME Agency Contacted: 1300 Representative spoke with at DME Agency: [place]: NA (unable to get [place] covered) [place] Agency: NA        Social Determinants of Health ([place]) Interventions     Readmission Risk Interventions No flowsheet data found.

## 2020-06-02 NOTE — TOC Progression Note (Signed)
Transition of Care [facility]) - Progression Note    Patient Details  Name: Deborah [name] MRN: [id number] Date of Birth: 07/19/1955  Transition of Care [facility]) CM/SW [place], RN Phone Number: [phone]  06/02/2020, 12:23 PM  Clinical Narrative:    Outpatient referral for palliative care initiated with [place] notified.        Expected Discharge Plan and Services           Expected Discharge Date: 06/02/20                                     Social Determinants of Health (SDOH) Interventions    Readmission Risk Interventions No flowsheet data found.

## 2020-06-02 NOTE — Plan of Care (Signed)
  Problem: Education: Goal: Knowledge of General Education information will improve Description: Including pain rating scale, medication(s)/side effects and non-pharmacologic comfort measures Outcome: Adequate for Discharge   Problem: Health Behavior/Discharge Planning: Goal: Ability to manage health-related needs will improve Outcome: Adequate for Discharge   Problem: Clinical Measurements: Goal: Ability to maintain clinical measurements within normal limits will improve Outcome: Adequate for Discharge Goal: Will remain free from infection Outcome: Adequate for Discharge Goal: Diagnostic test results will improve Outcome: Adequate for Discharge Goal: Respiratory complications will improve Outcome: Adequate for Discharge Goal: Cardiovascular complication will be avoided Outcome: Adequate for Discharge   Problem: Activity: Goal: Risk for activity intolerance will decrease Outcome: Adequate for Discharge   Problem: Nutrition: Goal: Adequate nutrition will be maintained Outcome: Adequate for Discharge   Problem: Coping: Goal: Level of anxiety will decrease Outcome: Adequate for Discharge   Problem: Elimination: Goal: Will not experience complications related to bowel motility Outcome: Adequate for Discharge Goal: Will not experience complications related to urinary retention Outcome: Adequate for Discharge   Problem: Pain Managment: Goal: General experience of comfort will improve Outcome: Adequate for Discharge   Problem: Safety: Goal: Ability to remain free from injury will improve Outcome: Adequate for Discharge   Problem: Skin Integrity: Goal: Risk for impaired skin integrity will decrease Outcome: Adequate for Discharge   Problem: Acute Rehab PT Goals(only PT should resolve) Goal: Patient Will Transfer Sit To/From Stand Outcome: Adequate for Discharge Goal: Pt Will Transfer Bed To Chair/Chair To Bed Outcome: Adequate for Discharge Goal: Pt Will  Ambulate Outcome: Adequate for Discharge Goal: Pt Will Go Up/Down Stairs Outcome: Adequate for Discharge   Problem: Acute Rehab OT Goals (only OT should resolve) Goal: Pt. Will Perform Grooming Outcome: Adequate for Discharge Goal: Pt. Will Perform Lower Body Bathing Outcome: Adequate for Discharge Goal: Pt. Will Perform Upper Body Dressing Outcome: Adequate for Discharge Goal: Pt. Will Transfer To Toilet Outcome: Adequate for Discharge Goal: Pt. Will Perform Toileting-Clothing Manipulation Outcome: Adequate for Discharge Goal: OT Additional ADL Goal #1 Outcome: Adequate for Discharge

## 2020-06-02 NOTE — Progress Notes (Signed)
Occupational Therapy Treatment Patient Details Name: Deborah [name] MRN: [id number] DOB: 01/05/1956 Today's Date: 06/02/2020    History of present illness Pt is a 64 year old female admitted with multifactorial hypoxia including Acute on chronic respiratory failure with hypoxia 2/2 left exudative pleural effusion, left-sided pneumonia, copd w/ acute exacerbation, CAP. Pt underwent thoracentesis 11/9. PMHx including left-sided breast cancer status post lumpectomy and adjuvant chemo, left lower lobe lung cancer, chronic tachycardia , CHF with preserved EF, COPD/chronic hypoxic respiratory failure on 4 L home O2, HTN, OSA/pulmonary hypertension   OT comments  Patient seated in recliner upon entry and agreeable to OT session. Reviewed energy conservation techniques and activity progression with ADLs, mobility as plans to dc home today.  Provided handout and pt thankful for further education.  Simulated LB bathing and dressing with supervision today.  On 4L [place] during session, VSS.  Pt declined transfers, mobility as waiting for spouse to arrive for dc.  Continue to recommend HHOT services at dc.  Will follow.    Follow Up Recommendations  Home health OT;Supervision/Assistance - 24 hour    Equipment Recommendations  3 in 1 bedside commode    Recommendations for Other Services      Precautions / Restrictions Precautions Precautions: Fall Precaution Comments: watch sats Restrictions Weight Bearing Restrictions: No       Mobility Bed Mobility               General bed mobility comments: OOB in recliner upon entry   Transfers                 General transfer comment: pt declined, waiting for spouse to arrive for dc    Balance Overall balance assessment: Needs assistance Sitting-balance support: No upper extremity supported;Feet supported Sitting balance-Leahy Scale: Good                                     ADL either performed or assessed with  clinical judgement   ADL Overall ADL's : Needs assistance/impaired             Lower Body Bathing: Supervison/ safety;Sitting/lateral leans Lower Body Bathing Details (indicate cue type and reason): simulated techniques seated in recliner, using figure 4 techniques, discussed energy conservation and completion seated     Lower Body Dressing: Supervision/safety;Sitting/lateral leans Lower Body Dressing Details (indicate cue type and reason): simulated LB dressing, figure 4 techinque from recliner; disucssed energy conservation techniques                General ADL Comments: patient eager for dc home, thankful for energy conservation techniques and activity progression education      Vision       Perception     Praxis      Cognition Arousal/Alertness: Awake/alert Behavior During Therapy: WFL for tasks assessed/performed Overall Cognitive Status: Within Functional Limits for tasks assessed                                          Exercises     Shoulder Instructions       General Comments      Pertinent Vitals/ Pain       Pain Assessment: No/denies pain  Home Living  Prior Functioning/Environment              Frequency  Min 2X/week        Progress Toward Goals  OT Goals(current goals can now be found in the care plan section)  Progress towards OT goals: Progressing toward goals  Acute Rehab OT Goals Patient Stated Goal: home today OT Goal Formulation: With patient  Plan Discharge plan remains appropriate;Frequency remains appropriate    Co-evaluation                 AM-PAC OT "6 Clicks" Daily Activity     Outcome Measure   Help from another person eating meals?: None Help from another person taking care of personal grooming?: A Little Help from another person toileting, which includes using toliet, bedpan, or urinal?: A Little Help from another person  bathing (including washing, rinsing, drying)?: A Little Help from another person to put on and taking off regular upper body clothing?: A Little Help from another person to put on and taking off regular lower body clothing?: A Little 6 Click Score: 19    End of Session Equipment Utilized During Treatment: Oxygen (4L)  OT Visit Diagnosis: Muscle weakness (generalized) (M62.81);Other abnormalities of gait and mobility (R26.89);Other (comment) (decreased activity tolerance)   Activity Tolerance Patient tolerated treatment well   Patient Left in chair;with call bell/phone within reach   Nurse Communication Mobility status        Time: 4562-5638 OT Time Calculation (min): 16 min  Charges: OT General Charges $OT Visit: 1 Visit OT Treatments $Self Care/Home Management : 8-22 mins  Jolaine Artist, OT [place] Pager [phone] Office [place] 06/02/2020, 2:35 PM

## 2020-06-03 ENCOUNTER — Telehealth: Payer: Self-pay

## 2020-06-03 NOTE — Telephone Encounter (Signed)
Approved, do they need referral placed?

## 2020-06-03 NOTE — Telephone Encounter (Signed)
[place] Coordinator with [facility] called to say pt was D/C from [facility] yesterday and they recommending Palliative. Need approval from [place]. Please advise [phone] opt 2

## 2020-06-04 ENCOUNTER — Encounter (HOSPITAL_COMMUNITY): Payer: Self-pay | Admitting: Emergency Medicine

## 2020-06-04 ENCOUNTER — Emergency Department (HOSPITAL_COMMUNITY): Payer: BLUE CROSS/BLUE SHIELD

## 2020-06-04 ENCOUNTER — Other Ambulatory Visit: Payer: Self-pay

## 2020-06-04 ENCOUNTER — Inpatient Hospital Stay (HOSPITAL_COMMUNITY)
Admission: EM | Admit: 2020-06-04 | Discharge: 2020-06-08 | DRG: 378 | Disposition: A | Discharge status: Home / Self Care | Payer: BLUE CROSS/BLUE SHIELD | Attending: Internal Medicine | Admitting: Internal Medicine

## 2020-06-04 DIAGNOSIS — C50919 Malignant neoplasm of unspecified site of unspecified female breast: Secondary | ICD-10-CM | POA: Diagnosis not present

## 2020-06-04 DIAGNOSIS — Z8249 Family history of ischemic heart disease and other diseases of the circulatory system: Secondary | ICD-10-CM

## 2020-06-04 DIAGNOSIS — I9589 Other hypotension: Secondary | ICD-10-CM | POA: Diagnosis not present

## 2020-06-04 DIAGNOSIS — I5032 Chronic diastolic (congestive) heart failure: Secondary | ICD-10-CM | POA: Diagnosis present

## 2020-06-04 DIAGNOSIS — I110 Hypertensive heart disease with heart failure: Secondary | ICD-10-CM | POA: Diagnosis present

## 2020-06-04 DIAGNOSIS — Z79899 Other long term (current) drug therapy: Secondary | ICD-10-CM

## 2020-06-04 DIAGNOSIS — R0902 Hypoxemia: Secondary | ICD-10-CM | POA: Diagnosis not present

## 2020-06-04 DIAGNOSIS — Z87891 Personal history of nicotine dependence: Secondary | ICD-10-CM

## 2020-06-04 DIAGNOSIS — Z9221 Personal history of antineoplastic chemotherapy: Secondary | ICD-10-CM

## 2020-06-04 DIAGNOSIS — Z7401 Bed confinement status: Secondary | ICD-10-CM | POA: Diagnosis not present

## 2020-06-04 DIAGNOSIS — R58 Hemorrhage, not elsewhere classified: Secondary | ICD-10-CM | POA: Diagnosis not present

## 2020-06-04 DIAGNOSIS — D62 Acute posthemorrhagic anemia: Secondary | ICD-10-CM | POA: Diagnosis not present

## 2020-06-04 DIAGNOSIS — Z807 Family history of other malignant neoplasms of lymphoid, hematopoietic and related tissues: Secondary | ICD-10-CM

## 2020-06-04 DIAGNOSIS — M2550 Pain in unspecified joint: Secondary | ICD-10-CM | POA: Diagnosis not present

## 2020-06-04 DIAGNOSIS — J441 Chronic obstructive pulmonary disease with (acute) exacerbation: Secondary | ICD-10-CM | POA: Diagnosis not present

## 2020-06-04 DIAGNOSIS — Z8719 Personal history of other diseases of the digestive system: Secondary | ICD-10-CM | POA: Diagnosis not present

## 2020-06-04 DIAGNOSIS — Z6841 Body Mass Index (BMI) 40.0 and over, adult: Secondary | ICD-10-CM

## 2020-06-04 DIAGNOSIS — I700 Atherosclerosis of aorta: Secondary | ICD-10-CM | POA: Diagnosis not present

## 2020-06-04 DIAGNOSIS — Z801 Family history of malignant neoplasm of trachea, bronchus and lung: Secondary | ICD-10-CM

## 2020-06-04 DIAGNOSIS — J984 Other disorders of lung: Secondary | ICD-10-CM

## 2020-06-04 DIAGNOSIS — Z7982 Long term (current) use of aspirin: Secondary | ICD-10-CM

## 2020-06-04 DIAGNOSIS — K922 Gastrointestinal hemorrhage, unspecified: Secondary | ICD-10-CM | POA: Diagnosis not present

## 2020-06-04 DIAGNOSIS — G4733 Obstructive sleep apnea (adult) (pediatric): Secondary | ICD-10-CM | POA: Diagnosis present

## 2020-06-04 DIAGNOSIS — R739 Hyperglycemia, unspecified: Secondary | ICD-10-CM | POA: Diagnosis present

## 2020-06-04 DIAGNOSIS — C50912 Malignant neoplasm of unspecified site of left female breast: Secondary | ICD-10-CM | POA: Diagnosis present

## 2020-06-04 DIAGNOSIS — F32A Depression, unspecified: Secondary | ICD-10-CM | POA: Diagnosis present

## 2020-06-04 DIAGNOSIS — J449 Chronic obstructive pulmonary disease, unspecified: Secondary | ICD-10-CM | POA: Diagnosis present

## 2020-06-04 DIAGNOSIS — Z853 Personal history of malignant neoplasm of breast: Secondary | ICD-10-CM

## 2020-06-04 DIAGNOSIS — Z923 Personal history of irradiation: Secondary | ICD-10-CM | POA: Diagnosis not present

## 2020-06-04 DIAGNOSIS — E669 Obesity, unspecified: Secondary | ICD-10-CM | POA: Diagnosis present

## 2020-06-04 DIAGNOSIS — D7389 Other diseases of spleen: Secondary | ICD-10-CM | POA: Diagnosis not present

## 2020-06-04 DIAGNOSIS — K625 Hemorrhage of anus and rectum: Secondary | ICD-10-CM

## 2020-06-04 DIAGNOSIS — I959 Hypotension, unspecified: Secondary | ICD-10-CM | POA: Diagnosis present

## 2020-06-04 DIAGNOSIS — G4730 Sleep apnea, unspecified: Secondary | ICD-10-CM | POA: Diagnosis present

## 2020-06-04 DIAGNOSIS — R000 Tachycardia, unspecified: Secondary | ICD-10-CM | POA: Diagnosis not present

## 2020-06-04 DIAGNOSIS — K5731 Diverticulosis of large intestine without perforation or abscess with bleeding: Secondary | ICD-10-CM | POA: Diagnosis not present

## 2020-06-04 DIAGNOSIS — J918 Pleural effusion in other conditions classified elsewhere: Secondary | ICD-10-CM | POA: Diagnosis not present

## 2020-06-04 DIAGNOSIS — E860 Dehydration: Secondary | ICD-10-CM | POA: Diagnosis present

## 2020-06-04 DIAGNOSIS — N179 Acute kidney failure, unspecified: Secondary | ICD-10-CM | POA: Diagnosis not present

## 2020-06-04 DIAGNOSIS — Z8701 Personal history of pneumonia (recurrent): Secondary | ICD-10-CM

## 2020-06-04 DIAGNOSIS — K5730 Diverticulosis of large intestine without perforation or abscess without bleeding: Secondary | ICD-10-CM | POA: Diagnosis not present

## 2020-06-04 DIAGNOSIS — J9611 Chronic respiratory failure with hypoxia: Secondary | ICD-10-CM | POA: Diagnosis not present

## 2020-06-04 DIAGNOSIS — Z800 Family history of malignant neoplasm of digestive organs: Secondary | ICD-10-CM

## 2020-06-04 DIAGNOSIS — J80 Acute respiratory distress syndrome: Secondary | ICD-10-CM | POA: Diagnosis not present

## 2020-06-04 DIAGNOSIS — Z808 Family history of malignant neoplasm of other organs or systems: Secondary | ICD-10-CM

## 2020-06-04 DIAGNOSIS — Z85118 Personal history of other malignant neoplasm of bronchus and lung: Secondary | ICD-10-CM

## 2020-06-04 DIAGNOSIS — J90 Pleural effusion, not elsewhere classified: Secondary | ICD-10-CM | POA: Diagnosis not present

## 2020-06-04 DIAGNOSIS — Z818 Family history of other mental and behavioral disorders: Secondary | ICD-10-CM

## 2020-06-04 DIAGNOSIS — Z20822 Contact with and (suspected) exposure to covid-19: Secondary | ICD-10-CM | POA: Diagnosis not present

## 2020-06-04 DIAGNOSIS — I10 Essential (primary) hypertension: Secondary | ICD-10-CM | POA: Diagnosis not present

## 2020-06-04 DIAGNOSIS — E872 Acidosis: Secondary | ICD-10-CM | POA: Diagnosis not present

## 2020-06-04 DIAGNOSIS — E876 Hypokalemia: Secondary | ICD-10-CM | POA: Diagnosis not present

## 2020-06-04 DIAGNOSIS — Z825 Family history of asthma and other chronic lower respiratory diseases: Secondary | ICD-10-CM

## 2020-06-04 DIAGNOSIS — Z9981 Dependence on supplemental oxygen: Secondary | ICD-10-CM

## 2020-06-04 DIAGNOSIS — D696 Thrombocytopenia, unspecified: Secondary | ICD-10-CM | POA: Diagnosis present

## 2020-06-04 DIAGNOSIS — R0602 Shortness of breath: Secondary | ICD-10-CM | POA: Diagnosis not present

## 2020-06-04 DIAGNOSIS — I517 Cardiomegaly: Secondary | ICD-10-CM | POA: Diagnosis not present

## 2020-06-04 DIAGNOSIS — R531 Weakness: Secondary | ICD-10-CM | POA: Diagnosis present

## 2020-06-04 LAB — CBC WITH DIFFERENTIAL/PLATELET
Abs Immature Granulocytes: 1.86 10*3/uL — ABNORMAL HIGH (ref 0.00–0.07)
Basophils Absolute: 0.1 10*3/uL (ref 0.0–0.1)
Basophils Relative: 0 %
Eosinophils Absolute: 0 10*3/uL (ref 0.0–0.5)
Eosinophils Relative: 0 %
HCT: 35.6 % — ABNORMAL LOW (ref 36.0–46.0)
Hemoglobin: 10.6 g/dL — ABNORMAL LOW (ref 12.0–15.0)
Immature Granulocytes: 5 %
Lymphocytes Relative: 1 %
Lymphs Abs: 0.3 10*3/uL — ABNORMAL LOW (ref 0.7–4.0)
MCH: 27.9 pg (ref 26.0–34.0)
MCHC: 29.8 g/dL — ABNORMAL LOW (ref 30.0–36.0)
MCV: 93.7 fL (ref 80.0–100.0)
Monocytes Absolute: 2.7 10*3/uL — ABNORMAL HIGH (ref 0.1–1.0)
Monocytes Relative: 7 %
Neutro Abs: 35.3 10*3/uL — ABNORMAL HIGH (ref 1.7–7.7)
Neutrophils Relative %: 87 %
Platelets: 400 10*3/uL (ref 150–400)
RBC: 3.8 MIL/uL — ABNORMAL LOW (ref 3.87–5.11)
RDW: 16.9 % — ABNORMAL HIGH (ref 11.5–15.5)
WBC: 40.3 10*3/uL — ABNORMAL HIGH (ref 4.0–10.5)
nRBC: 0.5 % — ABNORMAL HIGH (ref 0.0–0.2)

## 2020-06-04 LAB — TROPONIN I (HIGH SENSITIVITY)
Troponin I (High Sensitivity): 13 ng/L (ref <18)
Troponin I (High Sensitivity): 14 ng/L (ref <18)

## 2020-06-04 LAB — I-STAT CHEM 8, ED
BUN: 32 mg/dL — ABNORMAL HIGH (ref 8–23)
Calcium, Ion: 1.12 mmol/L — ABNORMAL LOW (ref 1.15–1.40)
Chloride: 98 mmol/L (ref 98–111)
Creatinine, Ser: 1 mg/dL (ref 0.44–1.00)
Glucose, Bld: 158 mg/dL — ABNORMAL HIGH (ref 70–99)
HCT: 33 % — ABNORMAL LOW (ref 36.0–46.0)
Hemoglobin: 11.2 g/dL — ABNORMAL LOW (ref 12.0–15.0)
Potassium: 4.7 mmol/L (ref 3.5–5.1)
Sodium: 139 mmol/L (ref 135–145)
TCO2: 30 mmol/L (ref 22–32)

## 2020-06-04 LAB — PREPARE RBC (CROSSMATCH)

## 2020-06-04 LAB — BASIC METABOLIC PANEL
Anion gap: 10 (ref 5–15)
BUN: 34 mg/dL — ABNORMAL HIGH (ref 8–23)
CO2: 31 mmol/L (ref 22–32)
Calcium: 8 mg/dL — ABNORMAL LOW (ref 8.9–10.3)
Chloride: 98 mmol/L (ref 98–111)
Creatinine, Ser: 1.03 mg/dL — ABNORMAL HIGH (ref 0.44–1.00)
GFR, Estimated: >60 mL/min (ref >60)
Glucose, Bld: 198 mg/dL — ABNORMAL HIGH (ref 70–99)
Potassium: 4.5 mmol/L (ref 3.5–5.1)
Sodium: 139 mmol/L (ref 135–145)

## 2020-06-04 LAB — RESP PANEL BY RT-PCR (FLU A&B, COVID) ARPGX2
Influenza A by PCR: NEGATIVE
Influenza B by PCR: NEGATIVE
SARS Coronavirus 2 by RT PCR: NEGATIVE

## 2020-06-04 LAB — HEPATIC FUNCTION PANEL
ALT: 40 U/L (ref 0–44)
AST: 26 U/L (ref 15–41)
Albumin: 2.4 g/dL — ABNORMAL LOW (ref 3.5–5.0)
Alkaline Phosphatase: 67 U/L (ref 38–126)
Bilirubin, Direct: 0.1 mg/dL (ref 0.0–0.2)
Indirect Bilirubin: 0.4 mg/dL (ref 0.3–0.9)
Total Bilirubin: 0.5 mg/dL (ref 0.3–1.2)
Total Protein: 5.1 g/dL — ABNORMAL LOW (ref 6.5–8.1)

## 2020-06-04 LAB — LACTIC ACID, PLASMA
Lactic Acid, Venous: 3.1 mmol/L (ref 0.5–1.9)
Lactic Acid, Venous: 4.9 mmol/L (ref 0.5–1.9)

## 2020-06-04 LAB — MRSA PCR SCREENING: MRSA by PCR: NEGATIVE

## 2020-06-04 LAB — PROTIME-INR
INR: 1.1 (ref 0.8–1.2)
Prothrombin Time: 13.9 seconds (ref 11.4–15.2)

## 2020-06-04 LAB — LIPASE, BLOOD: Lipase: 28 U/L (ref 11–51)

## 2020-06-04 LAB — POC OCCULT BLOOD, ED: Fecal Occult Bld: POSITIVE — AB

## 2020-06-04 LAB — GLUCOSE, CAPILLARY: Glucose-Capillary: 138 mg/dL — ABNORMAL HIGH (ref 70–99)

## 2020-06-04 MED ORDER — IOHEXOL 350 MG/ML SOLN
100.0000 mL | Freq: Once | INTRAVENOUS | Status: AC | PRN
  Administered 2020-06-04: 100 mL via INTRAVENOUS

## 2020-06-04 MED ORDER — SODIUM CHLORIDE 0.9 % IV BOLUS
1000.0000 mL | Freq: Once | INTRAVENOUS | Status: AC
  Administered 2020-06-04: 1000 mL via INTRAVENOUS

## 2020-06-04 MED ORDER — ACETAMINOPHEN 650 MG RE SUPP: 650.0000 mg | Freq: Four times a day (QID) | RECTAL | Status: DC | PRN

## 2020-06-04 MED ORDER — VANCOMYCIN HCL 2000 MG/400ML IV SOLN
2000.0000 mg | Freq: Once | INTRAVENOUS | Status: AC
  Administered 2020-06-04: 2000 mg via INTRAVENOUS
  Filled 2020-06-04: qty 400

## 2020-06-04 MED ORDER — SODIUM CHLORIDE (PF) 0.9 % IJ SOLN
INTRAMUSCULAR | Status: AC
  Filled 2020-06-04: qty 50

## 2020-06-04 MED ORDER — ORAL CARE MOUTH RINSE
15.0000 mL | Freq: Two times a day (BID) | OROMUCOSAL | Status: DC
  Administered 2020-06-04 – 2020-06-08 (×8): 15 mL via OROMUCOSAL

## 2020-06-04 MED ORDER — SODIUM CHLORIDE 0.9 % IV SOLN: INTRAVENOUS | Status: AC

## 2020-06-04 MED ORDER — ACETAMINOPHEN 325 MG PO TABS
650.0000 mg | ORAL_TABLET | Freq: Four times a day (QID) | ORAL | Status: DC | PRN
  Administered 2020-06-05 – 2020-06-07 (×4): 650 mg via ORAL
  Filled 2020-06-04 (×4): qty 2

## 2020-06-04 MED ORDER — CHLORHEXIDINE GLUCONATE CLOTH 2 % EX PADS
6.0000 | MEDICATED_PAD | Freq: Every day | CUTANEOUS | Status: DC
  Administered 2020-06-05 – 2020-06-08 (×4): 6 via TOPICAL

## 2020-06-04 MED ORDER — SODIUM CHLORIDE 0.9 % IV SOLN
10.0000 mL/h | Freq: Once | INTRAVENOUS | Status: AC
  Administered 2020-06-04: 10 mL/h via INTRAVENOUS

## 2020-06-04 MED ORDER — SODIUM CHLORIDE 0.9 % IV BOLUS (SEPSIS)
1000.0000 mL | Freq: Once | INTRAVENOUS | Status: AC
  Administered 2020-06-04: 1000 mL via INTRAVENOUS

## 2020-06-04 MED ORDER — SODIUM CHLORIDE 0.9 % IV SOLN: INTRAVENOUS | Status: DC

## 2020-06-04 MED ORDER — PIPERACILLIN-TAZOBACTAM 3.375 G IVPB
3.3750 g | Freq: Once | INTRAVENOUS | Status: AC
  Administered 2020-06-04: 3.375 g via INTRAVENOUS
  Filled 2020-06-04: qty 50

## 2020-06-04 MED ORDER — VANCOMYCIN HCL IN DEXTROSE 1-5 GM/200ML-% IV SOLN: 1000.0000 mg | Freq: Once | INTRAVENOUS | Status: DC

## 2020-06-04 MED ORDER — SODIUM CHLORIDE 0.9% IV SOLUTION: Freq: Once | INTRAVENOUS | Status: DC

## 2020-06-04 NOTE — Progress Notes (Signed)
Manufacturing engineer [facility]) Community Based Palliative Care       This patient has been referred to our palliative care services in the community but returned to inpatient care before she could been seen.  [facility] will continue to follow for any discharge planning needs and to coordinate admission onto palliative care.   If you have questions or need assistance, please call [phone] or contact the hospital Liaison listed on AMION.     Thank you for the opportunity to participate in this patient's care.     Domenic Moras, BSN, RN [facility] Liaison   [phone]

## 2020-06-04 NOTE — ED Provider Notes (Signed)
[facility] DEPT Provider Note   CSN: [id number] Arrival date & time: 06/04/20  1506     History Chief Complaint  Patient presents with  . Weakness  . Rectal Bleeding    Deborah [name] is a 64 y.o. female.  Presents to ER with concern for bright red blood per rectum.  Reports that she was in her normal state of health earlier this morning, felt like she was having an overall good day.  States that she had some slight nausea and then felt like she had to have a bowel movement.  When she had the bowel movement she noted a large amount of blood coming from her bottom.  No abdominal pain.  States that she feels better now.  No nausea or vomiting.  States that her breathing is at baseline (5 L nasal cannula).  She denies prior GI bleed, denies blood thinner use.  left sided breast cancer status post lumpectomy and adjuvant chemotherapy, left lower lobe lung cancer, chronic tachycardia, CHF with preserved EF, COPD, chronic hypoxic respiratory failure on 4 L supplemental oxygen at baseline, hypertension, obstructive sleep apnea      HPI     Past Medical History:  Diagnosis Date  . Anemia    as teen  . Asthma   . Breast cancer ([place])    left breast cancer 02/2020  . CHF (congestive heart failure) ([place])   . COPD, mild ([place])   . Depression   . Diverticulitis   . Family history of anesthesia complication    vomiting  . GI bleeding   . Heart failure ([place])    New onset 07/25/14  . Histoplasmosis    left eye  . Hyperkalemia   . Hypertension   . Lung cancer ([place]) 05/02/2018   s/p chemoradiation, immunotherapy  . Obesity (BMI 30-39.9)   . Pneumonia    dx wtih pneumonia on 05/27/16- seen by [facility]   . PONV (postoperative nausea and vomiting)   . Restrictive lung disease   . Shortness of breath    with exertion   . Sleep apnea    mask and oxygen at nite for sleep at 2L   . Tobacco abuse   . Umbilical hernia     Patient Active Problem List    Diagnosis Date Noted  . Acute GI bleeding 06/04/2020  . Acute hypoxemic respiratory failure ([place])   . Advanced care planning/counseling discussion   . Palliative care by specialist   . Acute on chronic respiratory failure with hypoxia ([place]) 05/24/2020  . Port-A-Cath in place 05/11/2020  . Malignant neoplasm of upper-outer quadrant of left breast in female, estrogen receptor negative ([place]) 02/25/2020  . Surgical counseling visit 02/23/2020  . Physical deconditioning 02/23/2020  . Rash and nonspecific skin eruption 12/16/2019  . Hyperglycemia 11/12/2019  . Palpitations 11/12/2019  . Encounter for antineoplastic immunotherapy 08/14/2018  . Primary malignant neoplasm of bronchus of left lower lobe ([place]) 05/02/2018  . Encounter for antineoplastic chemotherapy 05/02/2018  . Goals of care, counseling/discussion 05/02/2018  . Choledocholithiasis   . COPD (chronic obstructive pulmonary disease) ([place]) 01/01/2017  . Special screening for malignant neoplasms, colon   . Benign neoplasm of cecum   . Benign neoplasm of ascending colon   . Benign neoplasm of descending colon   . Benign neoplasm of sigmoid colon   . Depression 04/20/2016  . GI bleeding 01/08/2016  . Pressure ulcer 01/08/2016  . Diverticulitis of colon 01/08/2016  . Severe sepsis ([place])   .  COPD exacerbation ([place]) 12/18/2015  . Vitamin D deficiency 03/29/2015  . Fatigue 10/27/2014  . Chronic diastolic heart failure ([place]) 09/01/2014  . OSA (obstructive sleep apnea) 08/26/2014  . Hypoxemia   . Pulmonary HTN ([place])   . CHF, acute on chronic, unknown EF 07/25/2014  . CRLD (chronic restrictive lung disease) secondary to super morbid obesity with mild COPD component 07/25/2014  . Tobacco abuse disorder 07/25/2014  . Morbid obesity ([place]) 06/05/2013  . Dyspnea 04/17/2013  . Chronic respiratory failure ([place]) 04/17/2013  . Hypertension 04/09/2013  . Umbilical hernia     Past Surgical History:  Procedure Laterality Date  .  APPLICATION OF WOUND VAC  03/19/2013   Procedure: APPLICATION OF WOUND VAC;  Surgeon: Madilyn Hook, DO;  Location: [facility];  Service: General;;  . BREAST LUMPECTOMY WITH RADIOACTIVE SEED AND SENTINEL LYMPH NODE BIOPSY Left 04/08/2020   Procedure: LEFT BREAST LUMPECTOMY WITH RADIOACTIVE SEED AND LEFT AXILLARY SENTINEL LYMPH NODE BIOPSY;  Surgeon: Alphonsa Overall, MD;  Location: [facility];  Service: General;  Laterality: Left;  PEC BLOCK, NEEDS ANESTHESIA CONSULT  . CESAREAN SECTION  04/12/85  . COLONOSCOPY WITH PROPOFOL N/A 06/13/2016   Procedure: COLONOSCOPY WITH PROPOFOL;  Surgeon: Jerene Bears, MD;  Location: [facility];  Service: Gastroenterology;  Laterality: N/A;  . ERCP N/A 02/15/2017   Procedure: ENDOSCOPIC RETROGRADE CHOLANGIOPANCREATOGRAPHY (ERCP);  Surgeon: [name], MD;  Location: [facility];  Service: Endoscopy;  Laterality: N/A;  . INSERTION OF MESH N/A 03/19/2013   Procedure: INSERTION OF MESH;  Surgeon: Madilyn Hook, DO;  Location: [facility];  Service: General;  Laterality: N/A;  . IR IMAGING GUIDED PORT INSERTION  03/26/2020  . IR THORACENTESIS ASP PLEURAL SPACE W/IMG GUIDE  05/25/2020  . VENTRAL HERNIA REPAIR N/A 03/19/2013   Procedure:  OPEN VENTRAL HERNIA REPAIR WITH MESH AND APPLICATION OF WOUND VAC;  Surgeon: Madilyn Hook, DO;  Location: [facility];  Service: General;  Laterality: N/A;  . VIDEO BRONCHOSCOPY Bilateral 04/19/2018   Procedure: VIDEO BRONCHOSCOPY WITH FLUORO;  Surgeon: Rigoberto Noel, MD;  Location: [facility];  Service: Cardiopulmonary;  Laterality: Bilateral;     OB History    Gravida  0   Para  0   Term  0   Preterm  0   AB  0   Living        SAB  0   TAB  0   Ectopic  0   Multiple      Live Births              Family History  Problem Relation Age of Onset  . Heart attack Mother   . Emphysema Mother        was a smoker  . Congestive Heart Failure Father   . Bipolar disorder Father   . Lymphoma Maternal Uncle   . Pancreatic cancer  Maternal Aunt   . Bipolar disorder Sister   . Schizophrenia Sister   . Other Sister        tumor in her neck  . Melanoma Sister   . Heart attack Maternal Uncle   . Heart attack Maternal Uncle   . Colon cancer Neg Hx   . Breast cancer Neg Hx     Social History   Tobacco Use  . Smoking status: Former Smoker    Packs/day: 3.00    Years: 43.00    Pack years: 129.00    Types: Cigarettes    Quit date: 12/17/2012  Years since quitting: 7.4  . Smokeless tobacco: Never Used  Vaping Use  . Vaping Use: Former  Substance Use Topics  . Alcohol use: No    Alcohol/week: 0.0 standard drinks  . Drug use: No    Home Medications Prior to Admission medications   Medication Sig Start Date End Date Taking? Authorizing Provider  acetaminophen (TYLENOL) 325 MG tablet Take 650 mg by mouth every 6 (six) hours as needed for headache (pain).   Yes [provider]  albuterol (VENTOLIN HFA) 108 (90 Base) MCG/ACT inhaler Inhale 2 puffs into the lungs every 6 (six) hours as needed for wheezing or shortness of breath. 01/16/20  Yes Brand Males, MD  aspirin 81 MG chewable tablet Chew 1 tablet (81 mg total) by mouth daily. 10/05/14  Yes [name], MD  Aspirin-Caffeine [facility] FAST PAIN RELIEF ARTHRITIS PO) Take 1 packet by mouth daily as needed (back pain).   Yes [provider]  Budeson-Glycopyrrol-Formoterol (BREZTRI AEROSPHERE) 160-9-4.8 MCG/ACT AERO Inhale 2 puffs into the lungs daily. Patient taking differently: Inhale 2 puffs into the lungs daily as needed (shortness of breath/wheezing).  01/14/20  Yes Brand Males, MD  diltiazem (CARDIZEM CD) 240 MG 24 hr capsule TAKE 1 CAPSULE BY MOUTH EVERY DAY Patient taking differently: Take 240 mg by mouth daily.  05/21/20  Yes [name], MD  furosemide (LASIX) 20 MG tablet TAKE 2 TABLETS BY MOUTH EVERY DAY Patient taking differently: Take 20 mg by mouth daily.  05/03/20  Yes Pleas Koch, NP  ipratropium-albuterol (DUONEB)  0.5-2.5 (3) MG/3ML SOLN Take 3 mLs by nebulization every 3 (three) hours as needed. Patient taking differently: Take 3 mLs by nebulization every 3 (three) hours as needed (shortness of breath/wheezing).  04/14/20  Yes Hunsucker, Bonna Gains, MD  lidocaine-prilocaine (EMLA) cream Apply 1 application topically as needed (prior to port access).   Yes [provider]  methylPREDNISolone (MEDROL DOSEPAK) 4 MG TBPK tablet Take it as directed. Patient taking differently: Take 4-24 mg by mouth as directed. Take 6 tablets (24 mg) on Day 1 Take 5 tablets (20 mg) on Day 2 Take 4 tablets (16 mg) on Day 3 Take 3 tablets (12 mg) on Day 4 Take 2 tablets (8 mg) on Day 5 Take 1 tablet (4 mg) on Day 6 06/02/20  Yes Shawna Clamp, MD  montelukast (SINGULAIR) 10 MG tablet Take 1 tablet (10 mg total) by mouth at bedtime. 12/31/19  Yes Pleas Koch, NP  potassium chloride SA (KLOR-CON M20) 20 MEQ tablet TAKE 1/2 TABLET BY MOUTH EVERY DAY Patient taking differently: Take 10 mEq by mouth daily.  05/18/20  Yes Pleas Koch, NP  traMADol (ULTRAM) 50 MG tablet Take 1 tablet (50 mg total) by mouth every 6 (six) hours as needed for moderate pain or severe pain. 04/08/20  Yes Alphonsa Overall, MD  OXYGEN Inhale 4 L/min into the lungs continuous.     [provider]  Spacer/Aero-Holding Chambers (AEROCHAMBER MV) inhaler Use as instructed 01/01/17   Parrett, Fonnie Mu, NP  prochlorperazine (COMPAZINE) 10 MG tablet Take 1 tablet (10 mg total) by mouth every 6 (six) hours as needed (Nausea or vomiting). 04/19/20 05/18/20  Nicholas Lose, MD    Allergies    Patient has no known allergies.  Review of Systems   Review of Systems  Constitutional: Negative for chills and fever.  HENT: Negative for ear pain and sore throat.   Eyes: Negative for pain and visual disturbance.  Respiratory:  Negative for cough and shortness of breath.   Cardiovascular: Negative for chest pain and palpitations.  Gastrointestinal:  Positive for blood in stool, diarrhea and nausea. Negative for abdominal pain and vomiting.  Genitourinary: Negative for dysuria and hematuria.  Musculoskeletal: Negative for arthralgias and back pain.  Skin: Negative for color change and rash.  Neurological: Negative for seizures and syncope.  All other systems reviewed and are negative.   Physical Exam Updated Vital Signs BP 97/67   Pulse 97   Temp 98 F (36.7 C) (Oral)   Resp (!) 23   Ht 5\' 2"  (1.575 m)   Wt 102.8 kg   SpO2 99%   BMI 41.45 kg/m   Physical Exam Vitals and nursing note reviewed.  Constitutional:      General: She is not in acute distress.    Appearance: She is well-developed.  HENT:     Head: Normocephalic and atraumatic.  Eyes:     Conjunctiva/sclera: Conjunctivae normal.  Cardiovascular:     Rate and Rhythm: Normal rate and regular rhythm.     Heart sounds: No murmur heard.   Pulmonary:     Effort: Pulmonary effort is normal. No respiratory distress.     Breath sounds: Normal breath sounds.  Abdominal:     Palpations: Abdomen is soft.     Tenderness: There is no abdominal tenderness.  Genitourinary:    Comments: Dried blood around rectum, stool is dark red Musculoskeletal:        General: No deformity or signs of injury.     Cervical back: Neck supple.  Skin:    General: Skin is warm and dry.  Neurological:     General: No focal deficit present.     Mental Status: She is alert.  Psychiatric:        Mood and Affect: Mood normal.     ED Results / Procedures / Treatments   Labs (all labs ordered are listed, but only abnormal results are displayed) Labs Reviewed  CBC WITH DIFFERENTIAL/PLATELET - Abnormal; Notable for the following components:      Result Value   WBC 40.3 (*)    RBC 3.80 (*)    Hemoglobin 10.6 (*)    HCT 35.6 (*)    MCHC 29.8 (*)    RDW 16.9 (*)    nRBC 0.5 (*)    Neutro Abs 35.3 (*)    Lymphs Abs 0.3 (*)    Monocytes Absolute 2.7 (*)    Abs Immature Granulocytes  1.86 (*)    All other components within normal limits  BASIC METABOLIC PANEL - Abnormal; Notable for the following components:   Glucose, Bld 198 (*)    BUN 34 (*)    Creatinine, Ser 1.03 (*)    Calcium 8.0 (*)    All other components within normal limits  LACTIC ACID, PLASMA - Abnormal; Notable for the following components:   Lactic Acid, Venous 3.1 (*)    All other components within normal limits  HEPATIC FUNCTION PANEL - Abnormal; Notable for the following components:   Total Protein 5.1 (*)    Albumin 2.4 (*)    All other components within normal limits  POC OCCULT BLOOD, ED - Abnormal; Notable for the following components:   Fecal Occult Bld POSITIVE (*)    All other components within normal limits  I-STAT CHEM 8, ED - Abnormal; Notable for the following components:   BUN 32 (*)    Glucose, Bld 158 (*)    Calcium,  Ion 1.12 (*)    Hemoglobin 11.2 (*)    HCT 33.0 (*)    All other components within normal limits  RESP PANEL BY RT-PCR (FLU A&B, COVID) ARPGX2  CULTURE, BLOOD (ROUTINE X 2)  CULTURE, BLOOD (ROUTINE X 2)  URINE CULTURE  PROTIME-INR  LIPASE, BLOOD  LACTIC ACID, PLASMA  URINALYSIS, ROUTINE W REFLEX MICROSCOPIC  TYPE AND SCREEN  PREPARE RBC (CROSSMATCH)  PREPARE RBC (CROSSMATCH)  TROPONIN I (HIGH SENSITIVITY)  TROPONIN I (HIGH SENSITIVITY)    EKG EKG Interpretation  Date/Time:  Friday June 04 2020 15:25:06 EST Ventricular Rate:  101 PR Interval:    QRS Duration: 84 QT Interval:  325 QTC Calculation: 422 R Axis:   76 Text Interpretation: Sinus tachycardia Low voltage, precordial leads Confirmed by Madalyn Rob [phone]) on 06/04/2020 3:35:00 PM   Radiology DG Chest Portable 1 View  Result Date: 06/04/2020 CLINICAL DATA:  Shortness of breath.  Bright red blood per rectum. EXAM: PORTABLE CHEST 1 VIEW COMPARISON:  One-view chest x-ray 05/26/2020 FINDINGS: Heart is enlarged. Right IJ Port-A-Cath is in place. Tip is stable in the right atrium.  Aeration of both lungs has improved. There is some persistent retrocardiac opacity and probable left pleural effusion. IMPRESSION: 1. Improving aeration of both lungs. 2. Persistent retrocardiac opacity and probable left pleural effusion. Electronically Signed   By: San Morelle M.D.   On: 06/04/2020 17:02   CT Angio Abd/Pel W and/or Wo Contrast  Result Date: 06/04/2020 CLINICAL DATA:  Right red blood per rectum.  Hypertension. EXAM: CTA ABDOMEN AND PELVIS WITHOUT AND WITH CONTRAST TECHNIQUE: Multidetector CT imaging of the abdomen and pelvis was performed using the standard protocol during bolus administration of intravenous contrast. Multiplanar reconstructed images and MIPs were obtained and reviewed to evaluate the vascular anatomy. CONTRAST:  124mL OMNIPAQUE IOHEXOL 350 MG/ML SOLN COMPARISON:  None. FINDINGS: VASCULAR Aorta: Heavily calcified aorta.  No aneurysm or dissection. Celiac: Patent without evidence of aneurysm, dissection, vasculitis or significant stenosis. SMA: Patent without evidence of aneurysm, dissection, vasculitis or significant stenosis. Renals: Both renal arteries are patent without evidence of aneurysm, dissection, vasculitis, fibromuscular dysplasia or significant stenosis. IMA: Patent without evidence of aneurysm, dissection, vasculitis or significant stenosis. Inflow: Atherosclerotic calcifications.  No aneurysm or dissection. Proximal Outflow: Atherosclerotic calcifications. No aneurysm or dissection. Veins: No obvious venous abnormality within the limitations of this arterial phase study. Review of the MIP images confirms the above findings. NON-VASCULAR Lower chest: Small left pleural effusion.  Right lung base clear. Hepatobiliary: There is pneumobilia. Gas noted in the gallbladder, common bile duct and intrahepatic ducts mostly in the left hepatic lobe. No focal hepatic abnormality. Pancreas: No focal abnormality or ductal dilatation. Spleen: Scattered calcifications in  the spleen.  Normal size. Adrenals/Urinary Tract: No adrenal abnormality. No focal renal abnormality. No stones or hydronephrosis. Urinary bladder is unremarkable. Stomach/Bowel: Colonic diverticulosis diffusely. No active diverticulitis. Stomach and small bowel decompressed, unremarkable. No active contrast extravasation seen 2 localize GI bleed. Lymphatic: No adenopathy Reproductive: Prior hysterectomy.  No adnexal masses. Other: No free fluid or free air. Musculoskeletal: No acute bony abnormality. IMPRESSION: VASCULAR No active extravasation of contrast seen within the bowel to localize GI bleed. Diffuse aortoiliac atherosclerosis. NON-VASCULAR Small left pleural effusion. Pneumobilia, presumably related to prior sphincterotomy. Diffuse colonic diverticulosis.  No active diverticulitis. Electronically Signed   By: Rolm Baptise M.D.   On: 06/04/2020 18:53    Procedures .Critical Care Performed by: Lucrezia Starch, MD Authorized by: Lucrezia Starch,  MD   Critical care provider statement:    Critical care time (minutes):  45   Critical care was necessary to treat or prevent imminent or life-threatening deterioration of the following conditions: GI bleed.   Critical care was time spent personally by me on the following activities:  Discussions with consultants, evaluation of patient's response to treatment, examination of patient, ordering and performing treatments and interventions, ordering and review of laboratory studies, ordering and review of radiographic studies, pulse oximetry, re-evaluation of patient's condition, obtaining history from patient or surrogate and review of old charts Ultrasound ED Peripheral IV (Provider)  Date/Time: 06/04/2020 7:51 PM Performed by: Lucrezia Starch, MD Authorized by: Lucrezia Starch, MD   Procedure details:    Indications: hydration and hypotension     Location:  Right AC   Angiocath:  18 G   Bedside Ultrasound Guided: Yes     Patient  tolerated procedure without complications: Yes     Dressing applied: Yes     (including critical care time)  Medications Ordered in ED Medications  piperacillin-tazobactam (ZOSYN) IVPB 3.375 g (3.375 g Intravenous New Bag/Given 06/04/20 1639)  0.9 %  sodium chloride infusion (has no administration in time range)  0.9 %  sodium chloride infusion (Manually program via Guardrails IV Fluids) (has no administration in time range)  sodium chloride (PF) 0.9 % injection (has no administration in time range)  0.9 %  sodium chloride infusion (has no administration in time range)  sodium chloride 0.9 % bolus 1,000 mL (1,000 mLs Intravenous New Bag/Given 06/04/20 1548)  0.9 %  sodium chloride infusion (10 mL/hr Intravenous New Bag/Given 06/04/20 1847)  sodium chloride 0.9 % bolus 1,000 mL (1,000 mLs Intravenous New Bag/Given 06/04/20 1640)  vancomycin (VANCOREADY) IVPB 2000 mg/400 mL (2,000 mg Intravenous New Bag/Given 06/04/20 1641)  iohexol (OMNIPAQUE) 350 MG/ML injection 100 mL (100 mLs Intravenous Contrast Given 06/04/20 1750)    ED Course  I have reviewed the triage vital signs and the nursing notes.  Pertinent labs & imaging results that were available during my care of the patient were reviewed by me and considered in my medical decision making (see chart for details).  Clinical Course as of Jun 05 1951  Fri Jun 04, 2020  1614 Rechecked patient, still mentating well, husband at bedside, awaiting labs, will order to transfuse blood given persistent hypotention in setting of GI bleed   [RD]  1621 Hemoglobin is stable, WBC is profoundly elevated at 40, though no fever, concern for possible sepsis, will start broad-spectrum antibiotics, give additional fluid bolus and obtain blood cultures   [RD]  1629 Pt had large bloody BM - will plan to transfuse 1 unit, check CTA a/p to eval for source of bleeding or sepsis   [RD]  1642 Blood bank said first unit will be down in 15 mins, placed USIV    [RD]  1723 Discussed with GI, Dr. Henrene Pastor, agrees with CTA, if positive recommends IR consultation, suspect most likely diverticular bleed based on history and prior colonoscopy, otherwise continue resuscitation no other changes for now   [RD]    Clinical Course User Index [RD] Lucrezia Starch, MD   MDM Rules/Calculators/A&P                         64 year old lady with breast cancer status post lumpectomy and adjuvant chemotherapy, left lower lobe lung cancer,CHF with preserved EF, COPD presents with bright red blood per rectum.  On initial assessment, patient noted to be hypotensive, blood in stool, but mentating very well.  Gave fluid bolus, empirically ordered 1 unit blood transfusion.  Her initial hemoglobin was at baseline.  CTA abdomen pelvis was negative for acute bleed.  Her BP improved though remained soft after receiving fluids.  Throughout ER stay her mentation was well and patient was not in any sort of respiratory failure or respiratory difficulty.  GI saw patient.  Will transfusion additional 2 units.  Patient will need minimum stepdown unit tonight.  Discussed with hospitalist Dr. Hal Hope who will admit.  Final Clinical Impression(s) / ED Diagnoses Final diagnoses:  Rectal bleeding  Lower GI bleed  Acute blood loss anemia    Rx / DC Orders ED Discharge Orders    None       Lucrezia Starch, MD 06/04/20 1952

## 2020-06-04 NOTE — Plan of Care (Signed)

## 2020-06-04 NOTE — Progress Notes (Signed)
Pharmacy Note   A consult was received from an ED physician for vancomycin per pharmacy dosing.    The patient's profile has been reviewed for ht/wt/allergies/indication/available labs.    A one time order has been placed for vancomycin 2000 mg IV x1 .    Further antibiotics/pharmacy consults should be ordered by admitting physician if indicated.                       Thank you,  Royetta Asal, PharmD, BCPS 06/04/2020 4:23 PM

## 2020-06-04 NOTE — Consult Note (Signed)
GI CONSULT [facility] Requesting physician: EDP Dr. Roslynn Amble Reason for consultation: Acute GI bleeding Primary gastroenterologist: Zenovia Jarred MD    HISTORY OF PRESENT ILLNESS:  Deborah [name] is a 64 y.o. female with multiple medical problems including metastatic breast cancer for which she is being treated and COPD who was recently hospitalized for pneumonia.  She was doing well today until she developed abrupt pronounced rectal bleeding with bright red blood per rectum.  No abdominal pain.  No nausea or vomiting.  She has had multiple bloody bowel movements since.  I am seeing her at the bedside in the emergency room.  Fresh blood in the bedpan.  Her recent discharge hemoglobin was 10.2.  First hemoglobin here 10.6.  Current blood pressure is 78/50 heart rate 120.  She did undergo CT angiogram which revealed diverticulosis.  However, no active bleeding at the time of the examination just a short while ago.  Patient has a history of choledocholithiasis and underwent ERCP with sphincterotomy and stone extraction in 2018.  Also had colonoscopy in 2017 which revealed severe pandiverticulosis.  REVIEW OF SYSTEMS:  All non-GI ROS negative unless otherwise stated in the HPI except for shortness of breath  Past Medical History:  Diagnosis Date  . Anemia    as teen  . Asthma   . Breast cancer ([place])    left breast cancer 02/2020  . CHF (congestive heart failure) ([place])   . COPD, mild ([place])   . Depression   . Diverticulitis   . Family history of anesthesia complication    vomiting  . GI bleeding   . Heart failure ([place])    New onset 07/25/14  . Histoplasmosis    left eye  . Hyperkalemia   . Hypertension   . Lung cancer ([place]) 05/02/2018   s/p chemoradiation, immunotherapy  . Obesity (BMI 30-39.9)   . Pneumonia    dx wtih pneumonia on 05/27/16- seen by [facility]   . PONV (postoperative nausea and vomiting)   . Restrictive lung disease   . Shortness of breath    with exertion    . Sleep apnea    mask and oxygen at nite for sleep at 2L   . Tobacco abuse   . Umbilical hernia     Past Surgical History:  Procedure Laterality Date  . APPLICATION OF WOUND VAC  03/19/2013   Procedure: APPLICATION OF WOUND VAC;  Surgeon: Madilyn Hook, DO;  Location: [facility];  Service: General;;  . BREAST LUMPECTOMY WITH RADIOACTIVE SEED AND SENTINEL LYMPH NODE BIOPSY Left 04/08/2020   Procedure: LEFT BREAST LUMPECTOMY WITH RADIOACTIVE SEED AND LEFT AXILLARY SENTINEL LYMPH NODE BIOPSY;  Surgeon: Alphonsa Overall, MD;  Location: [facility];  Service: General;  Laterality: Left;  PEC BLOCK, NEEDS ANESTHESIA CONSULT  . CESAREAN SECTION  04/12/85  . COLONOSCOPY WITH PROPOFOL N/A 06/13/2016   Procedure: COLONOSCOPY WITH PROPOFOL;  Surgeon: Jerene Bears, MD;  Location: [facility];  Service: Gastroenterology;  Laterality: N/A;  . ERCP N/A 02/15/2017   Procedure: ENDOSCOPIC RETROGRADE CHOLANGIOPANCREATOGRAPHY (ERCP);  Surgeon: [name], MD;  Location: [facility];  Service: Endoscopy;  Laterality: N/A;  . INSERTION OF MESH N/A 03/19/2013   Procedure: INSERTION OF MESH;  Surgeon: Madilyn Hook, DO;  Location: [facility];  Service: General;  Laterality: N/A;  . IR IMAGING GUIDED PORT INSERTION  03/26/2020  . IR THORACENTESIS ASP PLEURAL SPACE W/IMG GUIDE  05/25/2020  . VENTRAL HERNIA REPAIR N/A 03/19/2013   Procedure:  OPEN VENTRAL  HERNIA REPAIR WITH MESH AND APPLICATION OF WOUND VAC;  Surgeon: Madilyn Hook, DO;  Location: [facility];  Service: General;  Laterality: N/A;  . VIDEO BRONCHOSCOPY Bilateral 04/19/2018   Procedure: VIDEO BRONCHOSCOPY WITH FLUORO;  Surgeon: Rigoberto Noel, MD;  Location: [facility];  Service: Cardiopulmonary;  Laterality: Bilateral;    Social History [name]  reports that she quit smoking about 7 years ago. Her smoking use included cigarettes. She has a 129.00 pack-year smoking history. She has never used smokeless tobacco. She reports that she does not drink alcohol and does  not use drugs.  family history includes Bipolar disorder in her father and sister; Congestive Heart Failure in her father; Emphysema in her mother; Heart attack in her maternal uncle, maternal uncle, and mother; Lymphoma in her maternal uncle; Melanoma in her sister; Other in her sister; Pancreatic cancer in her maternal aunt; Schizophrenia in her sister.  No Known Allergies     PHYSICAL EXAMINATION: Vital signs: BP (!) 75/58   Pulse (!) 123   Temp 98 F (36.7 C) (Oral)   Resp (!) 24   Ht 5\' 2"  (1.575 m)   Wt 102.8 kg   SpO2 93%   BMI 41.45 kg/m   Constitutional: Overweight, unhealthy appearing but in no acute distress Psychiatric: alert and oriented x3, cooperative Eyes: extraocular movements intact, anicteric, conjunctiva pink Mouth: oral pharynx moist, no lesions Neck: supple no lymphadenopathy Cardiovascular: heart regular rate and rhythm, no murmur Lungs: Rhonchi at the bases and mild wheezing Abdomen: soft, obese nontender, nondistended, no obvious ascites, no peritoneal signs, normal bowel sounds, no organomegaly Rectal: Omitted Extremities: no clubbing or cyanosis.  1+ lower extremity edema bilaterally Skin: no lesions on visible extremities Neuro: No focal deficits.  Cranial nerves intact  ASSESSMENT:  1.  Acute diverticular bleed.  CT angio negative for active bleeding.  Soft blood pressure but otherwise stable 2.  Multiple medical problems   PLAN:  1.  Recommend ICU or stepdown bed 2.  To functioning IVs 3.  Would transfuse 3 units of blood 4.  The patient continues with vigorous bleeding with hemodynamic compromise, would repeat CT angiogram and proceed to IR embolization therapy if positive. GI will follow.  Deborah Chuck. Geri Semin[place]., M.D. [facility] Division of Gastroenterology

## 2020-06-04 NOTE — ED Notes (Signed)
Attempted to call report. Receiving RN providing patient care and requested 5 more mins.

## 2020-06-04 NOTE — Telephone Encounter (Signed)
Called office no order needed given verbal orders to start care. Will let our office know if any issues.

## 2020-06-04 NOTE — ED Triage Notes (Signed)
Patient presents with bright red blood per rectum and  increased weakness. Rectal bleeding started today after coughing while on the toilet. When EMS arrived the patient was clammy and pale. The increase in weakness started after her last chemo treatment on 05/11/20. Patient believes the cancer may be spreading. HX, lung and breast cancer, lumpectomy of left breast.   EMS vitals: 93/50 BP 94% SPO2 on 6L O2 (chronic on 5L) 18 Resp Rate 94 HR

## 2020-06-04 NOTE — H&P (Signed)
History and Physical    Deborah [name] GUR:[id number] DOB: 02/18/1956 DOA: 06/04/2020  PCP: Pleas Koch, NP  Patient coming from: Home.  Chief Complaint: Rectal bleeding.  HPI: Deborah [name] is a 64 y.o. female with history of breast cancer and lung cancer recently admitted for pneumonia with parapneumonic effusion requiring thoracentesis antibiotics and also COPD exacerbation presently on steroids with history of diastolic CHF chronic respiratory failure on 4 L oxygen sleep apnea presents to the ER because of recurrent GI bleed at home.  Patient had frank rectal bleed without any abdominal pain or nausea vomiting.  Denies taking any NSAIDs.  Takes aspirin.  ED Course: In the ER patient was found to be hypotensive with systolic blood pressure in the 78 which improved with fluid bolus.  CT angiogram of the abdomen and pelvis does not show any active bleed but does show diverticulosis.  On-call gastroenterologist Dr. Henrene Pastor was consulted patient is receiving packed red blood cell transfusion and admitted for severe GI bleed likely lower GI.  Labs show hyperglycemia with blood sugar 198 could be from steroids creatinine mildly increased from baseline of 0.76 it is around 1 and CBC does show significant leukocytosis of 40.3 hemoglobin is around baseline of 10.6.  Covid test is negative.  Review of Systems: As per HPI, rest all negative.   Past Medical History:  Diagnosis Date  . Anemia    as teen  . Asthma   . Breast cancer ([place])    left breast cancer 02/2020  . CHF (congestive heart failure) ([place])   . COPD, mild ([place])   . Depression   . Diverticulitis   . Family history of anesthesia complication    vomiting  . GI bleeding   . Heart failure ([place])    New onset 07/25/14  . Histoplasmosis    left eye  . Hyperkalemia   . Hypertension   . Lung cancer ([place]) 05/02/2018   s/p chemoradiation, immunotherapy  . Obesity (BMI 30-39.9)   . Pneumonia    dx wtih pneumonia on 05/27/16-  seen by [facility]   . PONV (postoperative nausea and vomiting)   . Restrictive lung disease   . Shortness of breath    with exertion   . Sleep apnea    mask and oxygen at nite for sleep at 2L   . Tobacco abuse   . Umbilical hernia     Past Surgical History:  Procedure Laterality Date  . APPLICATION OF WOUND VAC  03/19/2013   Procedure: APPLICATION OF WOUND VAC;  Surgeon: Madilyn Hook, DO;  Location: [facility];  Service: General;;  . BREAST LUMPECTOMY WITH RADIOACTIVE SEED AND SENTINEL LYMPH NODE BIOPSY Left 04/08/2020   Procedure: LEFT BREAST LUMPECTOMY WITH RADIOACTIVE SEED AND LEFT AXILLARY SENTINEL LYMPH NODE BIOPSY;  Surgeon: Alphonsa Overall, MD;  Location: [facility];  Service: General;  Laterality: Left;  PEC BLOCK, NEEDS ANESTHESIA CONSULT  . CESAREAN SECTION  04/12/85  . COLONOSCOPY WITH PROPOFOL N/A 06/13/2016   Procedure: COLONOSCOPY WITH PROPOFOL;  Surgeon: Jerene Bears, MD;  Location: [facility];  Service: Gastroenterology;  Laterality: N/A;  . ERCP N/A 02/15/2017   Procedure: ENDOSCOPIC RETROGRADE CHOLANGIOPANCREATOGRAPHY (ERCP);  Surgeon: [name], MD;  Location: [facility];  Service: Endoscopy;  Laterality: N/A;  . INSERTION OF MESH N/A 03/19/2013   Procedure: INSERTION OF MESH;  Surgeon: Madilyn Hook, DO;  Location: [facility];  Service: General;  Laterality: N/A;  . IR IMAGING GUIDED PORT INSERTION  03/26/2020  .  IR THORACENTESIS ASP PLEURAL SPACE W/IMG GUIDE  05/25/2020  . VENTRAL HERNIA REPAIR N/A 03/19/2013   Procedure:  OPEN VENTRAL HERNIA REPAIR WITH MESH AND APPLICATION OF WOUND VAC;  Surgeon: Madilyn Hook, DO;  Location: [facility];  Service: General;  Laterality: N/A;  . VIDEO BRONCHOSCOPY Bilateral 04/19/2018   Procedure: VIDEO BRONCHOSCOPY WITH FLUORO;  Surgeon: Rigoberto Noel, MD;  Location: [facility];  Service: Cardiopulmonary;  Laterality: Bilateral;     reports that she quit smoking about 7 years ago. Her smoking use included cigarettes. She has a 129.00 pack-year  smoking history. She has never used smokeless tobacco. She reports that she does not drink alcohol and does not use drugs.  No Known Allergies  Family History  Problem Relation Age of Onset  . Heart attack Mother   . Emphysema Mother        was a smoker  . Congestive Heart Failure Father   . Bipolar disorder Father   . Lymphoma Maternal Uncle   . Pancreatic cancer Maternal Aunt   . Bipolar disorder Sister   . Schizophrenia Sister   . Other Sister        tumor in her neck  . Melanoma Sister   . Heart attack Maternal Uncle   . Heart attack Maternal Uncle   . Colon cancer Neg Hx   . Breast cancer Neg Hx     Prior to Admission medications   Medication Sig Start Date End Date Taking? Authorizing Provider  acetaminophen (TYLENOL) 325 MG tablet Take 650 mg by mouth every 6 (six) hours as needed for headache (pain).   Yes [provider]  albuterol (VENTOLIN HFA) 108 (90 Base) MCG/ACT inhaler Inhale 2 puffs into the lungs every 6 (six) hours as needed for wheezing or shortness of breath. 01/16/20  Yes Brand Males, MD  aspirin 81 MG chewable tablet Chew 1 tablet (81 mg total) by mouth daily. 10/05/14  Yes [name], MD  Aspirin-Caffeine [facility] FAST PAIN RELIEF ARTHRITIS PO) Take 1 packet by mouth daily as needed (back pain).   Yes [provider]  Budeson-Glycopyrrol-Formoterol (BREZTRI AEROSPHERE) 160-9-4.8 MCG/ACT AERO Inhale 2 puffs into the lungs daily. Patient taking differently: Inhale 2 puffs into the lungs daily as needed (shortness of breath/wheezing).  01/14/20  Yes Brand Males, MD  diltiazem (CARDIZEM CD) 240 MG 24 hr capsule TAKE 1 CAPSULE BY MOUTH EVERY DAY Patient taking differently: Take 240 mg by mouth daily.  05/21/20  Yes [name], MD  furosemide (LASIX) 20 MG tablet TAKE 2 TABLETS BY MOUTH EVERY DAY Patient taking differently: Take 20 mg by mouth daily.  05/03/20  Yes Pleas Koch, NP  ipratropium-albuterol (DUONEB) 0.5-2.5 (3)  MG/3ML SOLN Take 3 mLs by nebulization every 3 (three) hours as needed. Patient taking differently: Take 3 mLs by nebulization every 3 (three) hours as needed (shortness of breath/wheezing).  04/14/20  Yes Hunsucker, Bonna Gains, MD  lidocaine-prilocaine (EMLA) cream Apply 1 application topically as needed (prior to port access).   Yes [provider]  methylPREDNISolone (MEDROL DOSEPAK) 4 MG TBPK tablet Take it as directed. Patient taking differently: Take 4-24 mg by mouth as directed. Take 6 tablets (24 mg) on Day 1 Take 5 tablets (20 mg) on Day 2 Take 4 tablets (16 mg) on Day 3 Take 3 tablets (12 mg) on Day 4 Take 2 tablets (8 mg) on Day 5 Take 1 tablet (4 mg) on Day 6 06/02/20  Yes Shawna Clamp, MD  montelukast (SINGULAIR) 10 MG tablet Take 1 tablet (10 mg total) by mouth at bedtime. 12/31/19  Yes Pleas Koch, NP  potassium chloride SA (KLOR-CON M20) 20 MEQ tablet TAKE 1/2 TABLET BY MOUTH EVERY DAY Patient taking differently: Take 10 mEq by mouth daily.  05/18/20  Yes Pleas Koch, NP  traMADol (ULTRAM) 50 MG tablet Take 1 tablet (50 mg total) by mouth every 6 (six) hours as needed for moderate pain or severe pain. 04/08/20  Yes Alphonsa Overall, MD  OXYGEN Inhale 4 L/min into the lungs continuous.     [provider]  Spacer/Aero-Holding Chambers (AEROCHAMBER MV) inhaler Use as instructed 01/01/17   Parrett, Fonnie Mu, NP  prochlorperazine (COMPAZINE) 10 MG tablet Take 1 tablet (10 mg total) by mouth every 6 (six) hours as needed (Nausea or vomiting). 04/19/20 05/18/20  Nicholas Lose, MD    Physical Exam: Constitutional: Moderately built and nourished. Vitals:   06/04/20 2000 06/04/20 2009 06/04/20 2034 06/04/20 2100  BP:  (!) 88/66 92/62   Pulse: 96 (!) 103 90   Resp: (!) 22 18 (!) 22   Temp:   97.6 F (36.4 C) 97.6 F (36.4 C)  TempSrc:   Oral Oral  SpO2: 95% 94% 93%   Weight:    104 kg  Height:    5\' 2"  (1.575 m)   Eyes: Anicteric no pallor. ENMT: No  discharge from the ears eyes nose or mouth. Neck: No mass felt.  No neck rigidity. Respiratory: No rhonchi or crepitations. Cardiovascular: S1-S2 heard. Abdomen: Soft nontender bowel sounds present. Musculoskeletal: No edema. Skin: No rash. Neurologic: Alert awake oriented to time place and person.  Moves all extremities. Psychiatric: Appears normal.  Normal affect.   Labs on Admission: I have personally reviewed following labs and imaging studies  CBC: Recent Labs  Lab 05/31/20 0401 06/02/20 0500 06/04/20 1550 06/04/20 1624  WBC  --   --  40.3*  --   NEUTROABS  --   --  35.3*  --   HGB 10.2* 10.0* 10.6* 11.2*  HCT 34.4* 33.6* 35.6* 33.0*  MCV  --   --  93.7  --   PLT  --   --  400  --    Basic Metabolic Panel: Recent Labs  Lab 05/30/20 0807 05/31/20 0401 06/02/20 0500 06/04/20 1550 06/04/20 1624  NA 138 139 139 139 139  K 4.4 4.5 4.0 4.5 4.7  CL 93* 95* 98 98 98  CO2 36* 36* 35* 31  --   GLUCOSE 166* 198* 205* 198* 158*  BUN 18 19 18  34* 32*  CREATININE 0.61 0.66 0.76 1.03* 1.00  CALCIUM 8.4* 8.5* 8.4* 8.0*  --   MG  --  2.3  --   --   --   PHOS  --  4.6  --   --   --    GFR: Estimated Creatinine Clearance: 66 mL/min (by C-G formula based on SCr of 1 mg/dL). Liver Function Tests: Recent Labs  Lab 06/04/20 1626  AST 26  ALT 40  ALKPHOS 67  BILITOT 0.5  PROT 5.1*  ALBUMIN 2.4*   Recent Labs  Lab 06/04/20 1626  LIPASE 28   No results for input(s): AMMONIA in the last 168 hours. Coagulation Profile: Recent Labs  Lab 06/04/20 1550  INR 1.1   Cardiac Enzymes: No results for input(s): CKTOTAL, CKMB, CKMBINDEX, TROPONINI in the last 168 hours. BNP (last 3 results) No results for input(s): PROBNP in the last 8760  hours. HbA1C: No results for input(s): HGBA1C in the last 72 hours. CBG: No results for input(s): GLUCAP in the last 168 hours. Lipid Profile: No results for input(s): CHOL, HDL, LDLCALC, TRIG, CHOLHDL, LDLDIRECT in the last 72  hours. Thyroid Function Tests: No results for input(s): TSH, T4TOTAL, FREET4, T3FREE, THYROIDAB in the last 72 hours. Anemia Panel: No results for input(s): VITAMINB12, FOLATE, FERRITIN, TIBC, IRON, RETICCTPCT in the last 72 hours. Urine analysis:    Component Value Date/Time   COLORURINE AMBER (A) 05/24/2020 2050   APPEARANCEUR HAZY (A) 05/24/2020 2050   LABSPEC 1.012 05/24/2020 2050   PHURINE 5.0 05/24/2020 2050   GLUCOSEU NEGATIVE 05/24/2020 2050   HGBUR SMALL (A) 05/24/2020 2050   [place] NEGATIVE 05/24/2020 2050   [place] 05/24/2020 2050   PROTEINUR 30 (A) 05/24/2020 2050   UROBILINOGEN 0.2 10/04/2010 2351   NITRITE NEGATIVE 05/24/2020 2050   LEUKOCYTESUR NEGATIVE 05/24/2020 2050   Sepsis Labs: @LABRCNTIP (procalcitonin:4,lacticidven:4) ) Recent Results (from the past 240 hour(s))  Resp Panel by RT-PCR (Flu A&B, Covid) Nasopharyngeal Swab     Status: None   Collection Time: 06/04/20  5:01 PM   Specimen: Nasopharyngeal Swab; Nasopharyngeal(NP) swabs in vial transport medium  Result Value Ref Range Status   SARS Coronavirus 2 by RT PCR NEGATIVE NEGATIVE Final    Comment: (NOTE) SARS-CoV-2 target nucleic acids are NOT DETECTED.  The SARS-CoV-2 RNA is generally detectable in upper respiratory specimens during the acute phase of infection. The lowest concentration of SARS-CoV-2 viral copies this assay can detect is 138 copies/mL. A negative result does not preclude SARS-Cov-2 infection and should not be used as the sole basis for treatment or other patient management decisions. A negative result may occur with  improper specimen collection/handling, submission of specimen other than nasopharyngeal swab, presence of viral mutation(s) within the areas targeted by this assay, and inadequate number of viral copies(<138 copies/mL). A negative result must be combined with clinical observations, patient history, and epidemiological information. The expected result  is Negative.  Fact Sheet for Patients:  EntrepreneurPulse.com.au  Fact Sheet for Healthcare Providers:  IncredibleEmployment.be  This test is no t yet approved or cleared by the [place] FDA and  has been authorized for detection and/or diagnosis of SARS-CoV-2 by FDA under an Emergency Use Authorization (EUA). This EUA will remain  in effect (meaning this test can be used) for the duration of the COVID-19 declaration under Section 564(b)(1) of the Act, 21 U.S.C.section 360bbb-3(b)(1), unless the authorization is terminated  or revoked sooner.       Influenza A by PCR NEGATIVE NEGATIVE Final   Influenza B by PCR NEGATIVE NEGATIVE Final    Comment: (NOTE) The Xpert Xpress SARS-CoV-2/FLU/RSV plus assay is intended as an aid in the diagnosis of influenza from Nasopharyngeal swab specimens and should not be used as a sole basis for treatment. Nasal washings and aspirates are unacceptable for Xpert Xpress SARS-CoV-2/FLU/RSV testing.  Fact Sheet for Patients: EntrepreneurPulse.com.au  Fact Sheet for Healthcare Providers: IncredibleEmployment.be  This test is not yet approved or cleared by the [place] FDA and has been authorized for detection and/or diagnosis of SARS-CoV-2 by FDA under an Emergency Use Authorization (EUA). This EUA will remain in effect (meaning this test can be used) for the duration of the COVID-19 declaration under Section 564(b)(1) of the Act, 21 U.S.C. section 360bbb-3(b)(1), unless the authorization is terminated or revoked.  Performed at [facility], [place] [address], [place],  [address]  Radiological Exams on Admission: DG Chest Portable 1 View  Result Date: 06/04/2020 CLINICAL DATA:  Shortness of breath.  Bright red blood per rectum. EXAM: PORTABLE CHEST 1 VIEW COMPARISON:  One-view chest x-ray 05/26/2020 FINDINGS: Heart is enlarged. Right  IJ Port-A-Cath is in place. Tip is stable in the right atrium. Aeration of both lungs has improved. There is some persistent retrocardiac opacity and probable left pleural effusion. IMPRESSION: 1. Improving aeration of both lungs. 2. Persistent retrocardiac opacity and probable left pleural effusion. Electronically Signed   By: San Morelle M.D.   On: 06/04/2020 17:02   CT Angio Abd/Pel W and/or Wo Contrast  Result Date: 06/04/2020 CLINICAL DATA:  Right red blood per rectum.  Hypertension. EXAM: CTA ABDOMEN AND PELVIS WITHOUT AND WITH CONTRAST TECHNIQUE: Multidetector CT imaging of the abdomen and pelvis was performed using the standard protocol during bolus administration of intravenous contrast. Multiplanar reconstructed images and MIPs were obtained and reviewed to evaluate the vascular anatomy. CONTRAST:  133mL OMNIPAQUE IOHEXOL 350 MG/ML SOLN COMPARISON:  None. FINDINGS: VASCULAR Aorta: Heavily calcified aorta.  No aneurysm or dissection. Celiac: Patent without evidence of aneurysm, dissection, vasculitis or significant stenosis. SMA: Patent without evidence of aneurysm, dissection, vasculitis or significant stenosis. Renals: Both renal arteries are patent without evidence of aneurysm, dissection, vasculitis, fibromuscular dysplasia or significant stenosis. IMA: Patent without evidence of aneurysm, dissection, vasculitis or significant stenosis. Inflow: Atherosclerotic calcifications.  No aneurysm or dissection. Proximal Outflow: Atherosclerotic calcifications. No aneurysm or dissection. Veins: No obvious venous abnormality within the limitations of this arterial phase study. Review of the MIP images confirms the above findings. NON-VASCULAR Lower chest: Small left pleural effusion.  Right lung base clear. Hepatobiliary: There is pneumobilia. Gas noted in the gallbladder, common bile duct and intrahepatic ducts mostly in the left hepatic lobe. No focal hepatic abnormality. Pancreas: No focal  abnormality or ductal dilatation. Spleen: Scattered calcifications in the spleen.  Normal size. Adrenals/Urinary Tract: No adrenal abnormality. No focal renal abnormality. No stones or hydronephrosis. Urinary bladder is unremarkable. Stomach/Bowel: Colonic diverticulosis diffusely. No active diverticulitis. Stomach and small bowel decompressed, unremarkable. No active contrast extravasation seen 2 localize GI bleed. Lymphatic: No adenopathy Reproductive: Prior hysterectomy.  No adnexal masses. Other: No free fluid or free air. Musculoskeletal: No acute bony abnormality. IMPRESSION: VASCULAR No active extravasation of contrast seen within the bowel to localize GI bleed. Diffuse aortoiliac atherosclerosis. NON-VASCULAR Small left pleural effusion. Pneumobilia, presumably related to prior sphincterotomy. Diffuse colonic diverticulosis.  No active diverticulitis. Electronically Signed   By: Rolm Baptise M.D.   On: 06/04/2020 18:53    EKG: Independently reviewed.  Sinus tachycardia.  Assessment/Plan Principal Problem:   Acute GI bleeding Active Problems:   CRLD (chronic restrictive lung disease) secondary to super morbid obesity with mild COPD component   Chronic diastolic heart failure (HCC)   Acute blood loss anemia    1. Acute GI bleeding -likely lower GI bleed diverticulosis could be the source.  Appreciate GI consult.  We will keep patient n.p.o. if there is recurrent bleed may need to consult IR for possible embolization.  Patient is receiving 2 units PRBC transfusion closely monitor in stepdown.  IV fluids.  Hold aspirin. 2. Significant leukocytosis with recent admission for sepsis and pneumonia.  I think patient's hypotension is likely from volume loss from GI bleed.  However given the significant leukocytosis we will keep patient on empiric antibiotics for now. 3. Acute renal failure likely from hypotension and volume loss.  Follow metabolic  panel after hydration. 4. Chronic diastolic CHF  presently receiving fluids and blood transfusion because of GI bleed and hypotension. 5. Chronic respiratory failure on home oxygen. 6. Sleep apnea on CPAP at bedtime. 7. History of breast cancer being followed by Dr. [name], oncologist. 8. History of lung cancer being followed by Dr. Julien Nordmann. 9. Hyperglycemia could be from steroid check hemoglobin A1c with next blood draw.  We will closely follow CBGs.   DVT prophylaxis: SCDs.  Avoid anticoagulation secondary to GI bleed. Code Status: Full code. Family Communication: Patient's husband. Disposition Plan: Home. Consults called: GI. Admission status: Observation.   Rise Patience MD [facility] Pager [phone].  If 7PM-7AM, please contact night-coverage [email or web address] Password [facility]  06/04/2020, 9:28 PM

## 2020-06-05 ENCOUNTER — Inpatient Hospital Stay (HOSPITAL_COMMUNITY): Payer: BLUE CROSS/BLUE SHIELD

## 2020-06-05 DIAGNOSIS — I5032 Chronic diastolic (congestive) heart failure: Secondary | ICD-10-CM | POA: Diagnosis present

## 2020-06-05 DIAGNOSIS — I9589 Other hypotension: Secondary | ICD-10-CM | POA: Diagnosis not present

## 2020-06-05 DIAGNOSIS — Z8249 Family history of ischemic heart disease and other diseases of the circulatory system: Secondary | ICD-10-CM | POA: Diagnosis not present

## 2020-06-05 DIAGNOSIS — R531 Weakness: Secondary | ICD-10-CM | POA: Diagnosis present

## 2020-06-05 DIAGNOSIS — Z853 Personal history of malignant neoplasm of breast: Secondary | ICD-10-CM | POA: Diagnosis not present

## 2020-06-05 DIAGNOSIS — N179 Acute kidney failure, unspecified: Secondary | ICD-10-CM | POA: Diagnosis present

## 2020-06-05 DIAGNOSIS — Z87891 Personal history of nicotine dependence: Secondary | ICD-10-CM | POA: Diagnosis not present

## 2020-06-05 DIAGNOSIS — E861 Hypovolemia: Secondary | ICD-10-CM

## 2020-06-05 DIAGNOSIS — Z85118 Personal history of other malignant neoplasm of bronchus and lung: Secondary | ICD-10-CM | POA: Diagnosis not present

## 2020-06-05 DIAGNOSIS — D62 Acute posthemorrhagic anemia: Secondary | ICD-10-CM | POA: Diagnosis present

## 2020-06-05 DIAGNOSIS — J449 Chronic obstructive pulmonary disease, unspecified: Secondary | ICD-10-CM | POA: Diagnosis present

## 2020-06-05 DIAGNOSIS — Z9221 Personal history of antineoplastic chemotherapy: Secondary | ICD-10-CM | POA: Diagnosis not present

## 2020-06-05 DIAGNOSIS — Z923 Personal history of irradiation: Secondary | ICD-10-CM | POA: Diagnosis not present

## 2020-06-05 DIAGNOSIS — K5731 Diverticulosis of large intestine without perforation or abscess with bleeding: Secondary | ICD-10-CM | POA: Diagnosis present

## 2020-06-05 DIAGNOSIS — Z825 Family history of asthma and other chronic lower respiratory diseases: Secondary | ICD-10-CM | POA: Diagnosis not present

## 2020-06-05 DIAGNOSIS — E872 Acidosis: Secondary | ICD-10-CM | POA: Diagnosis present

## 2020-06-05 DIAGNOSIS — Z8701 Personal history of pneumonia (recurrent): Secondary | ICD-10-CM | POA: Diagnosis not present

## 2020-06-05 DIAGNOSIS — Z20822 Contact with and (suspected) exposure to covid-19: Secondary | ICD-10-CM | POA: Diagnosis present

## 2020-06-05 DIAGNOSIS — F32A Depression, unspecified: Secondary | ICD-10-CM | POA: Diagnosis present

## 2020-06-05 DIAGNOSIS — R739 Hyperglycemia, unspecified: Secondary | ICD-10-CM | POA: Diagnosis present

## 2020-06-05 DIAGNOSIS — Z6841 Body Mass Index (BMI) 40.0 and over, adult: Secondary | ICD-10-CM | POA: Diagnosis not present

## 2020-06-05 DIAGNOSIS — I959 Hypotension, unspecified: Secondary | ICD-10-CM | POA: Diagnosis present

## 2020-06-05 DIAGNOSIS — K922 Gastrointestinal hemorrhage, unspecified: Secondary | ICD-10-CM | POA: Diagnosis present

## 2020-06-05 DIAGNOSIS — J9611 Chronic respiratory failure with hypoxia: Secondary | ICD-10-CM | POA: Diagnosis not present

## 2020-06-05 DIAGNOSIS — J918 Pleural effusion in other conditions classified elsewhere: Secondary | ICD-10-CM | POA: Diagnosis present

## 2020-06-05 DIAGNOSIS — J441 Chronic obstructive pulmonary disease with (acute) exacerbation: Secondary | ICD-10-CM

## 2020-06-05 DIAGNOSIS — Z7982 Long term (current) use of aspirin: Secondary | ICD-10-CM | POA: Diagnosis not present

## 2020-06-05 DIAGNOSIS — G4730 Sleep apnea, unspecified: Secondary | ICD-10-CM | POA: Diagnosis present

## 2020-06-05 DIAGNOSIS — I110 Hypertensive heart disease with heart failure: Secondary | ICD-10-CM | POA: Diagnosis present

## 2020-06-05 LAB — COMPREHENSIVE METABOLIC PANEL
ALT: 36 U/L (ref 0–44)
AST: 24 U/L (ref 15–41)
Albumin: 2 g/dL — ABNORMAL LOW (ref 3.5–5.0)
Alkaline Phosphatase: 52 U/L (ref 38–126)
Anion gap: 8 (ref 5–15)
BUN: 35 mg/dL — ABNORMAL HIGH (ref 8–23)
CO2: 27 mmol/L (ref 22–32)
Calcium: 7.1 mg/dL — ABNORMAL LOW (ref 8.9–10.3)
Chloride: 104 mmol/L (ref 98–111)
Creatinine, Ser: 0.86 mg/dL (ref 0.44–1.00)
GFR, Estimated: >60 mL/min (ref >60)
Glucose, Bld: 135 mg/dL — ABNORMAL HIGH (ref 70–99)
Potassium: 4.7 mmol/L (ref 3.5–5.1)
Sodium: 139 mmol/L (ref 135–145)
Total Bilirubin: 0.7 mg/dL (ref 0.3–1.2)
Total Protein: 3.8 g/dL — ABNORMAL LOW (ref 6.5–8.1)

## 2020-06-05 LAB — CBC
HCT: 31.1 % — ABNORMAL LOW (ref 36.0–46.0)
HCT: 32.3 % — ABNORMAL LOW (ref 36.0–46.0)
HCT: 36.7 % (ref 36.0–46.0)
Hemoglobin: 10.3 g/dL — ABNORMAL LOW (ref 12.0–15.0)
Hemoglobin: 12.3 g/dL (ref 12.0–15.0)
Hemoglobin: 9.8 g/dL — ABNORMAL LOW (ref 12.0–15.0)
MCH: 29.6 pg (ref 26.0–34.0)
MCH: 29.8 pg (ref 26.0–34.0)
MCH: 29.9 pg (ref 26.0–34.0)
MCHC: 31.5 g/dL (ref 30.0–36.0)
MCHC: 31.9 g/dL (ref 30.0–36.0)
MCHC: 33.5 g/dL (ref 30.0–36.0)
MCV: 89.3 fL (ref 80.0–100.0)
MCV: 93.4 fL (ref 80.0–100.0)
MCV: 94 fL (ref 80.0–100.0)
Platelets: 165 10*3/uL (ref 150–400)
Platelets: 243 10*3/uL (ref 150–400)
Platelets: 244 10*3/uL (ref 150–400)
RBC: 3.31 MIL/uL — ABNORMAL LOW (ref 3.87–5.11)
RBC: 3.46 MIL/uL — ABNORMAL LOW (ref 3.87–5.11)
RBC: 4.11 MIL/uL (ref 3.87–5.11)
RDW: 15.8 % — ABNORMAL HIGH (ref 11.5–15.5)
RDW: 16.1 % — ABNORMAL HIGH (ref 11.5–15.5)
RDW: 16.8 % — ABNORMAL HIGH (ref 11.5–15.5)
WBC: 27.5 10*3/uL — ABNORMAL HIGH (ref 4.0–10.5)
WBC: 32.7 10*3/uL — ABNORMAL HIGH (ref 4.0–10.5)
WBC: 34.4 10*3/uL — ABNORMAL HIGH (ref 4.0–10.5)
nRBC: 0.5 % — ABNORMAL HIGH (ref 0.0–0.2)
nRBC: 0.6 % — ABNORMAL HIGH (ref 0.0–0.2)
nRBC: 0.7 % — ABNORMAL HIGH (ref 0.0–0.2)

## 2020-06-05 LAB — PROTIME-INR
INR: 1.2 (ref 0.8–1.2)
Prothrombin Time: 15.1 seconds (ref 11.4–15.2)

## 2020-06-05 LAB — GLUCOSE, CAPILLARY
Glucose-Capillary: 108 mg/dL — ABNORMAL HIGH (ref 70–99)
Glucose-Capillary: 109 mg/dL — ABNORMAL HIGH (ref 70–99)

## 2020-06-05 LAB — URINALYSIS, ROUTINE W REFLEX MICROSCOPIC
Bilirubin Urine: NEGATIVE
Glucose, UA: NEGATIVE mg/dL
Ketones, ur: NEGATIVE mg/dL
Nitrite: NEGATIVE
Protein, ur: 30 mg/dL — AB
Specific Gravity, Urine: >1.046 — ABNORMAL HIGH (ref 1.005–1.030)
pH: 5 (ref 5.0–8.0)

## 2020-06-05 LAB — HEMOGLOBIN AND HEMATOCRIT, BLOOD
HCT: 31.7 % — ABNORMAL LOW (ref 36.0–46.0)
Hemoglobin: 10.7 g/dL — ABNORMAL LOW (ref 12.0–15.0)

## 2020-06-05 LAB — PREPARE RBC (CROSSMATCH)

## 2020-06-05 LAB — LACTIC ACID, PLASMA
Lactic Acid, Venous: 1.4 mmol/L (ref 0.5–1.9)
Lactic Acid, Venous: 1.5 mmol/L (ref 0.5–1.9)

## 2020-06-05 MED ORDER — PIPERACILLIN-TAZOBACTAM 3.375 G IVPB
3.3750 g | Freq: Three times a day (TID) | INTRAVENOUS | Status: DC
  Administered 2020-06-05 – 2020-06-08 (×10): 3.375 g via INTRAVENOUS
  Filled 2020-06-05 (×10): qty 50

## 2020-06-05 MED ORDER — SODIUM CHLORIDE 0.9% IV SOLUTION: Freq: Once | INTRAVENOUS | Status: AC

## 2020-06-05 MED ORDER — CALCIUM GLUCONATE-NACL 2-0.675 GM/100ML-% IV SOLN: 2.0000 g | Freq: Once | INTRAVENOUS | Status: DC

## 2020-06-05 MED ORDER — FLUTICASONE FUROATE-VILANTEROL 100-25 MCG/INH IN AEPB
1.0000 | INHALATION_SPRAY | Freq: Every day | RESPIRATORY_TRACT | Status: DC
  Administered 2020-06-05 – 2020-06-07 (×3): 1 via RESPIRATORY_TRACT
  Filled 2020-06-05: qty 28

## 2020-06-05 MED ORDER — HYDROCORTISONE NA SUCCINATE PF 100 MG IJ SOLR
50.0000 mg | Freq: Three times a day (TID) | INTRAMUSCULAR | Status: DC
  Administered 2020-06-05 – 2020-06-07 (×7): 50 mg via INTRAVENOUS
  Filled 2020-06-05 (×7): qty 2

## 2020-06-05 MED ORDER — TRANEXAMIC ACID-NACL 1000-0.7 MG/100ML-% IV SOLN
1000.0000 mg | Freq: Once | INTRAVENOUS | Status: AC
  Administered 2020-06-05: 1000 mg via INTRAVENOUS
  Filled 2020-06-05: qty 100

## 2020-06-05 MED ORDER — SODIUM CHLORIDE 0.9 % IV SOLN: INTRAVENOUS | Status: DC | PRN

## 2020-06-05 MED ORDER — MUPIROCIN 2 % EX OINT
TOPICAL_OINTMENT | Freq: Two times a day (BID) | CUTANEOUS | Status: DC
  Administered 2020-06-05 (×2): 1 via TOPICAL
  Filled 2020-06-05 (×2): qty 22

## 2020-06-05 MED ORDER — VANCOMYCIN HCL 750 MG/150ML IV SOLN
750.0000 mg | Freq: Two times a day (BID) | INTRAVENOUS | Status: DC
  Administered 2020-06-05 – 2020-06-06 (×3): 750 mg via INTRAVENOUS
  Filled 2020-06-05 (×4): qty 150

## 2020-06-05 MED ORDER — SODIUM CHLORIDE 0.9 % IV BOLUS
1000.0000 mL | Freq: Once | INTRAVENOUS | Status: AC
  Administered 2020-06-05: 1000 mL via INTRAVENOUS

## 2020-06-05 MED ORDER — TECHNETIUM TC 99M-LABELED RED BLOOD CELLS IV KIT
19.6000 | PACK | Freq: Once | INTRAVENOUS | Status: AC
  Administered 2020-06-05: 19.6 via INTRAVENOUS

## 2020-06-05 MED ORDER — UMECLIDINIUM BROMIDE 62.5 MCG/INH IN AEPB
1.0000 | INHALATION_SPRAY | Freq: Every day | RESPIRATORY_TRACT | Status: DC
  Administered 2020-06-05 – 2020-06-07 (×3): 1 via RESPIRATORY_TRACT
  Filled 2020-06-05: qty 7

## 2020-06-05 MED ORDER — SODIUM CHLORIDE 0.9% FLUSH
10.0000 mL | Freq: Two times a day (BID) | INTRAVENOUS | Status: DC
  Administered 2020-06-05: 10 mL
  Administered 2020-06-05: 40 mL
  Administered 2020-06-06 – 2020-06-07 (×3): 10 mL

## 2020-06-05 MED ORDER — SODIUM CHLORIDE 0.9% FLUSH
10.0000 mL | INTRAVENOUS | Status: DC | PRN
  Administered 2020-06-05: 10 mL

## 2020-06-05 MED ORDER — CALCIUM GLUCONATE-NACL 1-0.675 GM/50ML-% IV SOLN
1.0000 g | Freq: Once | INTRAVENOUS | Status: AC
  Administered 2020-06-05: 1000 mg via INTRAVENOUS
  Filled 2020-06-05: qty 50

## 2020-06-05 NOTE — Progress Notes (Signed)
Pharmacy Antibiotic Note  Deborah [name] is a 64 y.o. female admitted on 06/04/2020 with hx of breast ca, lung ca, chronic tachycardia, CHF with preserved EF, COPD, chronic hypoxic respiratory failure on 4 L supplemental oxygen at baseline, hypertension, obstructive sleep apnea presents with GIB .  Pharmacy has been consulted for vancomycin and zosyn dosing for sepsis.  Received 1st doses in ED  Plan: vancomycin 750mg  IV q12h Zosyn 3.375g IV Q8H infused over 4hrs. Daily Scr Follow renal function, cultures and clinical course   Height: 5\' 2"  (157.5 cm) Weight: 104 kg (229 lb 4.5 oz) IBW/kg (Calculated) : 50.1  Temp (24hrs), Avg:97.7 F (36.5 C), Min:97.5 F (36.4 C), Max:98 F (36.7 C)  Recent Labs  Lab 05/30/20 0807 05/31/20 0401 06/02/20 0500 06/04/20 1550 06/04/20 1624 06/04/20 1701 06/04/20 2000 06/04/20 2336 06/05/20 0027  WBC  --   --   --  40.3*  --   --   --   --  32.7*  CREATININE 0.61 0.66 0.76 1.03* 1.00  --   --   --   --   LATICACIDVEN  --   --   --   --   --  3.1* 4.9* 1.5  --     Estimated Creatinine Clearance: 66 mL/min (by C-G formula based on SCr of 1 mg/dL).    No Known Allergies  Antimicrobials this admission: 11/19 vanc >> 11/19 zosyn >> Dose adjustments this admission:   Microbiology results: 11/19 BCx: 11/19  UCx:  11/19 MRSA PCR: neg  Thank you for allowing pharmacy to be a part of this patient's care.  Dolly Rias RPh 06/05/2020, 3:01 AM

## 2020-06-05 NOTE — Progress Notes (Signed)
PROGRESS NOTE    Deborah [name]  ZOX:[id number] DOB: 17-Mar-1956 DOA: 06/04/2020 PCP: Pleas Koch, NP    Chief Complaint  Patient presents with  . Weakness  . Rectal Bleeding    Brief Narrative:  64 year old lady with prior history of breast cancer, lung cancer recently admitted for pneumonia with parapneumonic effusion requiring thoracentesis, antibiotics, chronic diastolic heart failure, chronic respiratory failure requiring 4 L of nasal cannula oxygen, sleep apnea presents to ED for recurrent GI bleed at home.  On arrival to ED patient was hypotensive with systolic blood pressure 78 mm Hg, .  CT angiogram of the abdomen and pelvis showed diverticulosis.  GI consulted.  Patient so far has underwent 4 units of PRBC transfusion and 1 unit of cryoprecipitate.  Patient continues to have rectal bleeding.  She is scheduled for RBC tagged nuclear medicine scan today.   Assessment & Plan:   Principal Problem:   Acute GI bleeding Active Problems:   CRLD (chronic restrictive lung disease) secondary to super morbid obesity with mild COPD component   Chronic diastolic heart failure (HCC)   Acute blood loss anemia   GI bleed   Acute GI bleed probably from a diverticular bleed.  Keep patient n.p.o. s/p 4 units of PRBC transfusion and 1 unit of cryo Repeat hemoglobin around 12. Transfuse to keep hemoglobin greater than 7. Check H&H every 6 hours.  Hold anticoagulation and aspirin. If she continues to bleed, will need IR embolization. Currently awaiting for the RBC tagged scan.  GI on board , appreciate recommendations.    Chronic respiratory failure with hypoxia requiring 4 L of nasal cannula oxygen probably secondary to a combination of chronic diastolic heart failure, COPD, recent pneumonia with parapneumonic effusion Patient currently denies any respiratory symptoms. She appears euvolemic at this time.   Sleep apnea Continue with CPAP at bedtime.    Acute anemia of  blood loss secondary to GI bleed Transfuse to keep hemoglobin greater than 7.  Elevated lactic acid probably secondary to hypotension, dehydration, blood loss improved with blood transfusions.   Breast cancer and lung cancer Follow-up with oncology as recommended.     DVT prophylaxis: SCDs Code Status: Full code Family Communication: Family at bedside Disposition:   Status is: Inpatient  Remains inpatient appropriate because:Ongoing diagnostic testing needed not appropriate for outpatient work up, IV treatments appropriate due to intensity of illness or inability to take PO and Inpatient level of care appropriate due to severity of illness   Dispo: The patient is from: Home              Anticipated d/c is to: Pending              Anticipated d/c date is: 3 days              Patient currently is not medically stable to d/c.       Consultants:   GI  PCCM  Procedures:  Nuclear medicine RBC tagged scan Antimicrobials: None  Subjective: Pt denies any chest pain or sob.   Objective: Vitals:   06/05/20 1000 06/05/20 1100 06/05/20 1116 06/05/20 1200  BP: 109/66 127/64  112/70  Pulse: 90 85  96  Resp: (!) 21 (!) 22  (!) 22  Temp:  98.2 F (36.8 C)    TempSrc:  Oral    SpO2: 99% 98% 97% 95%  Weight:      Height:        Intake/Output Summary (Last 24 hours) at  06/05/2020 1327 Last data filed at 06/05/2020 1300 Gross per 24 hour  Intake 7302.18 ml  Output 650 ml  Net 6652.18 ml   Filed Weights   06/04/20 1622 06/04/20 2100  Weight: 102.8 kg 104 kg    Examination:  General exam: Appears calm and comfortable on 4 lit of Merlin Oxygen.  Respiratory system: Clear to auscultation. Respiratory effort normal. Cardiovascular system: S1 & S2 heard, RRR. No JVD,. No pedal edema. Gastrointestinal system: Abdomen is nondistended, soft and nontender.Normal bowel sounds heard. Central nervous system: Alert and oriented. No focal neurological deficits. Extremities:  Symmetric 5 x 5 power. Skin: No rashes, lesions or ulcers Psychiatry:  Mood & affect appropriate.     Data Reviewed: I have personally reviewed following labs and imaging studies  CBC: Recent Labs  Lab 06/04/20 1550 06/04/20 1624 06/05/20 0027 06/05/20 0332 06/05/20 1212  WBC 40.3*  --  32.7* 34.4* 27.5*  NEUTROABS 35.3*  --   --   --   --   HGB 10.6* 11.2* 10.3* 9.8* 12.3  HCT 35.6* 33.0* 32.3* 31.1* 36.7  MCV 93.7  --  93.4 94.0 89.3  PLT 400  --  243 244 182    Basic Metabolic Panel: Recent Labs  Lab 05/30/20 0807 05/30/20 0807 05/31/20 0401 06/02/20 0500 06/04/20 1550 06/04/20 1624 06/05/20 0350  NA 138   < > 139 139 139 139 139  K 4.4   < > 4.5 4.0 4.5 4.7 4.7  CL 93*   < > 95* 98 98 98 104  CO2 36*  --  36* 35* 31  --  27  GLUCOSE 166*   < > 198* 205* 198* 158* 135*  BUN 18   < > 19 18 34* 32* 35*  CREATININE 0.61   < > 0.66 0.76 1.03* 1.00 0.86  CALCIUM 8.4*  --  8.5* 8.4* 8.0*  --  7.1*  MG  --   --  2.3  --   --   --   --   PHOS  --   --  4.6  --   --   --   --    < > = values in this interval not displayed.    GFR: Estimated Creatinine Clearance: 76.8 mL/min (by C-G formula based on SCr of 0.86 mg/dL).  Liver Function Tests: Recent Labs  Lab 06/04/20 1626 06/05/20 0350  AST 26 24  ALT 40 36  ALKPHOS 67 52  BILITOT 0.5 0.7  PROT 5.1* 3.8*  ALBUMIN 2.4* 2.0*    CBG: Recent Labs  Lab 06/04/20 2310 06/05/20 1121  GLUCAP 138* 108*     Recent Results (from the past 240 hour(s))  Blood culture (routine x 2)     Status: None (Preliminary result)   Collection Time: 06/04/20  4:25 PM   Specimen: BLOOD  Result Value Ref Range Status   Specimen Description   Final    BLOOD RIGHT ANTECUBITAL Performed at [facility], [place] [address] [place], [place] [address]    Special Requests   Final    BOTTLES DRAWN AEROBIC AND ANAEROBIC Blood Culture adequate volume Performed at [place] [address] [place], [place] [address]    Culture   Final    NO GROWTH < 24 HOURS Performed at [place] [address], [place], [place] [address]    Report Status PENDING  Incomplete  Resp Panel by RT-PCR (Flu A&B, Covid) Nasopharyngeal Swab  Status: None   Collection Time: 06/04/20  5:01 PM   Specimen: Nasopharyngeal Swab; Nasopharyngeal(NP) swabs in vial transport medium  Result Value Ref Range Status   SARS Coronavirus 2 by RT PCR NEGATIVE NEGATIVE Final    Comment: (NOTE) SARS-CoV-2 target nucleic acids are NOT DETECTED.  The SARS-CoV-2 RNA is generally detectable in upper respiratory specimens during the acute phase of infection. The lowest concentration of SARS-CoV-2 viral copies this assay can detect is 138 copies/mL. A negative result does not preclude SARS-Cov-2 infection and should not be used as the sole basis for treatment or other patient management decisions. A negative result may occur with  improper specimen collection/handling, submission of specimen other than nasopharyngeal swab, presence of viral mutation(s) within the areas targeted by this assay, and inadequate number of viral copies(<138 copies/mL). A negative result must be combined with clinical observations, patient history, and epidemiological information. The expected result is Negative.  Fact Sheet for Patients:  EntrepreneurPulse.com.au  Fact Sheet for Healthcare Providers:  IncredibleEmployment.be  This test is no t yet approved or cleared by the [place] FDA and  has been authorized for detection and/or diagnosis of SARS-CoV-2 by FDA under an Emergency Use Authorization (EUA). This EUA will remain  in effect (meaning this test can be used) for the duration of the COVID-19 declaration under Section 564(b)(1) of the Act, 21 U.S.C.section 360bbb-3(b)(1), unless the authorization is terminated  or revoked sooner.       Influenza A by PCR NEGATIVE  NEGATIVE Final   Influenza B by PCR NEGATIVE NEGATIVE Final    Comment: (NOTE) The Xpert Xpress SARS-CoV-2/FLU/RSV plus assay is intended as an aid in the diagnosis of influenza from Nasopharyngeal swab specimens and should not be used as a sole basis for treatment. Nasal washings and aspirates are unacceptable for Xpert Xpress SARS-CoV-2/FLU/RSV testing.  Fact Sheet for Patients: EntrepreneurPulse.com.au  Fact Sheet for Healthcare Providers: IncredibleEmployment.be  This test is not yet approved or cleared by the [place] FDA and has been authorized for detection and/or diagnosis of SARS-CoV-2 by FDA under an Emergency Use Authorization (EUA). This EUA will remain in effect (meaning this test can be used) for the duration of the COVID-19 declaration under Section 564(b)(1) of the Act, 21 U.S.C. section 360bbb-3(b)(1), unless the authorization is terminated or revoked.  Performed at [facility], [place] [address] [place], [place] [address]   MRSA PCR Screening     Status: None   Collection Time: 06/04/20  9:05 PM   Specimen: Nasal Mucosa; Nasopharyngeal  Result Value Ref Range Status   MRSA by PCR NEGATIVE NEGATIVE Final    Comment:        The GeneXpert MRSA Assay (FDA approved for NASAL specimens only), is one component of a comprehensive MRSA colonization surveillance program. It is not intended to diagnose MRSA infection nor to guide or monitor treatment for MRSA infections. Performed at [facility], [place] [address], [place], [place] [address]          Radiology Studies: DG Chest Portable 1 View  Result Date: 06/04/2020 CLINICAL DATA:  Shortness of breath.  Bright red blood per rectum. EXAM: PORTABLE CHEST 1 VIEW COMPARISON:  One-view chest x-ray 05/26/2020 FINDINGS: Heart is enlarged. Right IJ Port-A-Cath is in place. Tip is stable in the right atrium. Aeration of both lungs has  improved. There is some persistent retrocardiac opacity and probable left pleural effusion. IMPRESSION: 1. Improving aeration of both lungs. 2. Persistent retrocardiac opacity  and probable left pleural effusion. Electronically Signed   By: San Morelle M.D.   On: 06/04/2020 17:02   CT Angio Abd/Pel W and/or Wo Contrast  Result Date: 06/04/2020 CLINICAL DATA:  Right red blood per rectum.  Hypertension. EXAM: CTA ABDOMEN AND PELVIS WITHOUT AND WITH CONTRAST TECHNIQUE: Multidetector CT imaging of the abdomen and pelvis was performed using the standard protocol during bolus administration of intravenous contrast. Multiplanar reconstructed images and MIPs were obtained and reviewed to evaluate the vascular anatomy. CONTRAST:  119mL OMNIPAQUE IOHEXOL 350 MG/ML SOLN COMPARISON:  None. FINDINGS: VASCULAR Aorta: Heavily calcified aorta.  No aneurysm or dissection. Celiac: Patent without evidence of aneurysm, dissection, vasculitis or significant stenosis. SMA: Patent without evidence of aneurysm, dissection, vasculitis or significant stenosis. Renals: Both renal arteries are patent without evidence of aneurysm, dissection, vasculitis, fibromuscular dysplasia or significant stenosis. IMA: Patent without evidence of aneurysm, dissection, vasculitis or significant stenosis. Inflow: Atherosclerotic calcifications.  No aneurysm or dissection. Proximal Outflow: Atherosclerotic calcifications. No aneurysm or dissection. Veins: No obvious venous abnormality within the limitations of this arterial phase study. Review of the MIP images confirms the above findings. NON-VASCULAR Lower chest: Small left pleural effusion.  Right lung base clear. Hepatobiliary: There is pneumobilia. Gas noted in the gallbladder, common bile duct and intrahepatic ducts mostly in the left hepatic lobe. No focal hepatic abnormality. Pancreas: No focal abnormality or ductal dilatation. Spleen: Scattered calcifications in the spleen.  Normal size.  Adrenals/Urinary Tract: No adrenal abnormality. No focal renal abnormality. No stones or hydronephrosis. Urinary bladder is unremarkable. Stomach/Bowel: Colonic diverticulosis diffusely. No active diverticulitis. Stomach and small bowel decompressed, unremarkable. No active contrast extravasation seen 2 localize GI bleed. Lymphatic: No adenopathy Reproductive: Prior hysterectomy.  No adnexal masses. Other: No free fluid or free air. Musculoskeletal: No acute bony abnormality. IMPRESSION: VASCULAR No active extravasation of contrast seen within the bowel to localize GI bleed. Diffuse aortoiliac atherosclerosis. NON-VASCULAR Small left pleural effusion. Pneumobilia, presumably related to prior sphincterotomy. Diffuse colonic diverticulosis.  No active diverticulitis. Electronically Signed   By: Rolm Baptise M.D.   On: 06/04/2020 18:53        Scheduled Meds: . sodium chloride   Intravenous Once  . Chlorhexidine Gluconate Cloth  6 each Topical Q0600  . fluticasone furoate-vilanterol  1 puff Inhalation Daily  . hydrocortisone sod succinate (SOLU-CORTEF) inj  50 mg Intravenous Q8H  . mouth rinse  15 mL Mouth Rinse BID  . mupirocin ointment   Topical BID  . sodium chloride flush  10-40 mL Intracatheter Q12H  . umeclidinium bromide  1 puff Inhalation Daily   Continuous Infusions: . sodium chloride 125 mL/hr at 06/05/20 1132  . sodium chloride    . piperacillin-tazobactam (ZOSYN)  IV 3.375 g (06/05/20 1145)  . vancomycin Stopped (06/05/20 0444)     LOS: 0 days        Hosie Poisson, MD [facility]   To contact the attending provider between 7A-7P or the covering provider during after hours 7P-7A, please log into the web site [email or web address] and access using universal  password for that web site. If you do not have the password, please call the hospital operator.  06/05/2020, 1:27 PM

## 2020-06-05 NOTE — Plan of Care (Addendum)
PCCM Plan of Care Note  Spoke with Dr. Hal Hope about this patient with diverticular bleeding and intermittent hypotension responsive to blood. She has functioning IVs, a port, and an arterial line is ordered. Primary team has already discussed case with IR who are following and prefer bleeding scan rather CTA at this time, have also ordered ABX in setting of leukocytosis in case infection playing role in hypotension. Can tolerate relatively low MAP 60 in setting active bleeding. Would give 1g calcium gluconate, and if she requires at least 4U pRBC total overnight would additionally transfuse 1 pool of cryo, 1U of platelets. If there is intermittent watery stool along with frankly bloody bowel movements, could check c diff given leukocytosis. If BP fails to respond to 2U pRBC ordered this AM we are glad to be involved.  Letta Median, Pulmonary/Critical Care

## 2020-06-05 NOTE — Progress Notes (Signed)
RT x's 2 attempted arterial line insertion without success, RN notified.

## 2020-06-05 NOTE — Progress Notes (Signed)
Dr. Hal Hope notified of large liquid bm, dark red/brown in color, 377ml. Also notified of hypotension 80s/50s. Reported H/H 9.8 / 31.1

## 2020-06-05 NOTE — Consult Note (Signed)
NAME:  Deborah [name], MRN:  [id number], DOB:  06/07/1956, LOS: 0 ADMISSION DATE:  06/04/2020, CONSULTATION DATE:  06/05/20 REFERRING MD:  Karleen Hampshire, CHIEF COMPLAINT:  LGIB, hypotension  Brief History   LGIB due to diverticulosis. Recent hospitalization for pneumonia. Has metastatic breast cancer for which she is undergoing chemo, chronic hypoxic respiratory failure due to COPD. She was undergoing therapy for breast cancer, but was intolerant and there are no plans to continue.  History of present illness   Deborah [name] is a 64 y/o woman with a history of COPD with chronic respiratory failure who was recently admitted for CAP who presented for acute BRBpR. She has had multiple episodes since this began yesterday. No abdominal pain. She has had this once before, but it self-resolved after only one episode. She denies constipation but reports she only had 2 BMs in her recent 11 day admission. Not taking AC or AP medications. She had lactic acidosis and hypotension responsive to IVF+ RBC transfusion. Negative CTA to localize source of bleeding. Tagged RBC scan ordered.   Past Medical History  COPD with chronic respiratory failure Breast cancer diverticulosis HTN Lung cancer in 2019- s/p chemo & radiation OSA; not using CPAP  Significant Hospital Events     Consults:  GI PCCM IR  Procedures:  CTA A/P with contrast> small left effusion, diffuse diverticulosis w/o active bleeding. No diverticulitis.  Significant Diagnostic Tests:    Micro Data:  Blood 11/19> Urine 11/19> covid negative  Antimicrobials:  Pip-tazo 11/20>  Interim history/subjective:    Objective   Blood pressure (!) 106/59, pulse 100, temperature 98 F (36.7 C), resp. rate 17, height 5\' 2"  (1.575 m), weight 104 kg, SpO2 97 %.        Intake/Output Summary (Last 24 hours) at 06/05/2020 0715 Last data filed at 06/05/2020 0277 Gross per 24 hour  Intake 6155.52 ml  Output 650 ml  Net 5505.52 ml   Filed  Weights   06/04/20 1622 06/04/20 2100  Weight: 102.8 kg 104 kg    Examination: General: chronically ill appearing woman laying in bed in NAD, appears stated age HENT: [place]/AT, eyes anicteric Lungs: wheezing bilaterally, breathing comfortably on 5L [place]. No conversational dyspnea Cardiovascular: RRR, no murmurs Abdomen: obese, soft, NT, no pain or tympany on percussion Extremities: no c/c/e Neuro: awake and alert, answering questions appropriately, face symmetric Derm: no rashes or petechiae. Skin breakdown on nose with scab, no erythema.  Resolved Hospital Problem list     Assessment & Plan:  Acute LGIB due to diverticulosis -appreciate IR & GI's assistance -serial CBCs; transfuse for Hb <7 or hemodynamically significant bleeding. Will need matched transfusions if she continues to require ongoing RBC transfusion -TXA x 1 dose now -remain NPO  COPD Recent LLL CAP with parapneumonic effusion Chronic hypoxic respiratory failure -supplemental O2 to maintain SpO2 >90% -restart triple inhaled therapy- Breo & Incruse while hospitalized -duonebs Q4h PRN  Nasal wound -mupirocen topical BID  Acute blood loss anemia 2/2 LGIB -con't transfusing to maintain Hb >7 and hemodynamic stability -consented verbally for blood transfusions today  Metastatic breast cancer, intolerant to chemo -Ongoing goals of care discussions are needed.  Palliative care consult is appropriate this admission.  Hypergylcemia -Accu-Cheks and sliding scale insulin -Goal BG 140-180 while in the ICU if requiring insulin  Best practice:  Per primary  Labs   CBC: Recent Labs  Lab 06/02/20 0500 06/04/20 1550 06/04/20 1624 06/05/20 0027 06/05/20 0332  WBC  --  40.3*  --  32.7* 34.4*  NEUTROABS  --  35.3*  --   --   --   HGB 10.0* 10.6* 11.2* 10.3* 9.8*  HCT 33.6* 35.6* 33.0* 32.3* 31.1*  MCV  --  93.7  --  93.4 94.0  PLT  --  400  --  243 371    Basic Metabolic Panel: Recent Labs  Lab 05/30/20 0807  05/30/20 0807 05/31/20 0401 06/02/20 0500 06/04/20 1550 06/04/20 1624 06/05/20 0350  NA 138   < > 139 139 139 139 139  K 4.4   < > 4.5 4.0 4.5 4.7 4.7  CL 93*   < > 95* 98 98 98 104  CO2 36*  --  36* 35* 31  --  27  GLUCOSE 166*   < > 198* 205* 198* 158* 135*  BUN 18   < > 19 18 34* 32* 35*  CREATININE 0.61   < > 0.66 0.76 1.03* 1.00 0.86  CALCIUM 8.4*  --  8.5* 8.4* 8.0*  --  7.1*  MG  --   --  2.3  --   --   --   --   PHOS  --   --  4.6  --   --   --   --    < > = values in this interval not displayed.   GFR: Estimated Creatinine Clearance: 76.8 mL/min (by C-G formula based on SCr of 0.86 mg/dL). Recent Labs  Lab 06/04/20 1550 06/04/20 1701 06/04/20 2000 06/04/20 2336 06/05/20 0027 06/05/20 0236 06/05/20 0332  WBC 40.3*  --   --   --  32.7*  --  34.4*  LATICACIDVEN  --  3.1* 4.9* 1.5  --  1.4  --     Liver Function Tests: Recent Labs  Lab 06/04/20 1626 06/05/20 0350  AST 26 24  ALT 40 36  ALKPHOS 67 52  BILITOT 0.5 0.7  PROT 5.1* 3.8*  ALBUMIN 2.4* 2.0*   Recent Labs  Lab 06/04/20 1626  LIPASE 28   No results for input(s): AMMONIA in the last 168 hours.  ABG    Component Value Date/Time   PHART 7.310 (L) 05/24/2020 2120   PCO2ART 76.8 (HH) 05/24/2020 2120   PO2ART 68 (L) 05/24/2020 2120   HCO3 38.6 (H) 05/24/2020 2120   TCO2 30 06/04/2020 1624   O2SAT 90.0 05/24/2020 2120     Coagulation Profile: Recent Labs  Lab 06/04/20 1550 06/05/20 0350  INR 1.1 1.2    Cardiac Enzymes: No results for input(s): CKTOTAL, CKMB, CKMBINDEX, TROPONINI in the last 168 hours.  HbA1C: Hemoglobin A1C  Date/Time Value Ref Range Status  11/12/2019 03:37 PM 5.5 4.0 - 5.6 % Final   Hgb A1c MFr Bld  Date/Time Value Ref Range Status  05/26/2020 05:05 AM 6.0 (H) 4.8 - 5.6 % Final    Comment:    (NOTE) Pre diabetes:          5.7%-6.4%  Diabetes:              >6.4%  Glycemic control for   <7.0% adults with diabetes   07/25/2014 06:00 PM 6.2 (H) <5.7 %  Final    Comment:    (NOTE)  According to the ADA Clinical Practice Recommendations for 2011, when HbA1c is used as a screening test:  >=6.5%   Diagnostic of Diabetes Mellitus           (if abnormal result is confirmed) 5.7-6.4%   Increased risk of developing Diabetes Mellitus References:Diagnosis and Classification of Diabetes Mellitus,Diabetes QVZD,6387,56(EPPIR 1):S62-S69 and Standards of Medical Care in         Diabetes - 2011,Diabetes JJOA,4166,06 (Suppl 1):S11-S61.     CBG: Recent Labs  Lab 06/04/20 2310  GLUCAP 138*    Review of Systems:   Review of Systems  Constitutional: Negative.   HENT: Negative.   Respiratory: Positive for shortness of breath and wheezing.   Cardiovascular: Negative.   Gastrointestinal: Positive for blood in stool. Negative for abdominal pain, nausea and vomiting.  Musculoskeletal: Negative.   Skin: Negative.   Neurological: Negative.      Past Medical History  She,  has a past medical history of Anemia, Asthma, Breast cancer ([place]), CHF (congestive heart failure) ([place]), COPD, mild ([place]), Depression, Diverticulitis, Family history of anesthesia complication, GI bleeding, Heart failure ([place]), Histoplasmosis, Hyperkalemia, Hypertension, Lung cancer ([place]) (05/02/2018), Obesity (BMI 30-39.9), Pneumonia, PONV (postoperative nausea and vomiting), Restrictive lung disease, Shortness of breath, Sleep apnea, Tobacco abuse, and Umbilical hernia.   Surgical History    Past Surgical History:  Procedure Laterality Date  . APPLICATION OF WOUND VAC  03/19/2013   Procedure: APPLICATION OF WOUND VAC;  Surgeon: Madilyn Hook, DO;  Location: [facility];  Service: General;;  . BREAST LUMPECTOMY WITH RADIOACTIVE SEED AND SENTINEL LYMPH NODE BIOPSY Left 04/08/2020   Procedure: LEFT BREAST LUMPECTOMY WITH RADIOACTIVE SEED AND LEFT AXILLARY SENTINEL LYMPH NODE BIOPSY;  Surgeon: Alphonsa Overall, MD;  Location:  [facility];  Service: General;  Laterality: Left;  PEC BLOCK, NEEDS ANESTHESIA CONSULT  . CESAREAN SECTION  04/12/85  . COLONOSCOPY WITH PROPOFOL N/A 06/13/2016   Procedure: COLONOSCOPY WITH PROPOFOL;  Surgeon: Jerene Bears, MD;  Location: [facility];  Service: Gastroenterology;  Laterality: N/A;  . ERCP N/A 02/15/2017   Procedure: ENDOSCOPIC RETROGRADE CHOLANGIOPANCREATOGRAPHY (ERCP);  Surgeon: [name], MD;  Location: [facility];  Service: Endoscopy;  Laterality: N/A;  . INSERTION OF MESH N/A 03/19/2013   Procedure: INSERTION OF MESH;  Surgeon: Madilyn Hook, DO;  Location: [facility];  Service: General;  Laterality: N/A;  . IR IMAGING GUIDED PORT INSERTION  03/26/2020  . IR THORACENTESIS ASP PLEURAL SPACE W/IMG GUIDE  05/25/2020  . VENTRAL HERNIA REPAIR N/A 03/19/2013   Procedure:  OPEN VENTRAL HERNIA REPAIR WITH MESH AND APPLICATION OF WOUND VAC;  Surgeon: Madilyn Hook, DO;  Location: [facility];  Service: General;  Laterality: N/A;  . VIDEO BRONCHOSCOPY Bilateral 04/19/2018   Procedure: VIDEO BRONCHOSCOPY WITH FLUORO;  Surgeon: Rigoberto Noel, MD;  Location: [facility];  Service: Cardiopulmonary;  Laterality: Bilateral;     Social History   reports that she quit smoking about 7 years ago. Her smoking use included cigarettes. She has a 129.00 pack-year smoking history. She has never used smokeless tobacco. She reports that she does not drink alcohol and does not use drugs.   Family History   Her family history includes Bipolar disorder in her father and sister; Congestive Heart Failure in her father; Emphysema in her mother; Heart attack in her maternal uncle, maternal uncle, and mother; Lymphoma in her maternal uncle; Melanoma in her sister; Other in her sister; Pancreatic cancer in her maternal aunt; Schizophrenia in her sister. There is  no history of Colon cancer or Breast cancer.   Allergies No Known Allergies   Home Medications  Prior to Admission medications   Medication Sig Start Date End  Date Taking? Authorizing Provider  acetaminophen (TYLENOL) 325 MG tablet Take 650 mg by mouth every 6 (six) hours as needed for headache (pain).   Yes [provider]  albuterol (VENTOLIN HFA) 108 (90 Base) MCG/ACT inhaler Inhale 2 puffs into the lungs every 6 (six) hours as needed for wheezing or shortness of breath. 01/16/20  Yes Brand Males, MD  aspirin 81 MG chewable tablet Chew 1 tablet (81 mg total) by mouth daily. 10/05/14  Yes [name], MD  Aspirin-Caffeine [facility] FAST PAIN RELIEF ARTHRITIS PO) Take 1 packet by mouth daily as needed (back pain).   Yes [provider]  Budeson-Glycopyrrol-Formoterol (BREZTRI AEROSPHERE) 160-9-4.8 MCG/ACT AERO Inhale 2 puffs into the lungs daily. Patient taking differently: Inhale 2 puffs into the lungs daily as needed (shortness of breath/wheezing).  01/14/20  Yes Brand Males, MD  diltiazem (CARDIZEM CD) 240 MG 24 hr capsule TAKE 1 CAPSULE BY MOUTH EVERY DAY Patient taking differently: Take 240 mg by mouth daily.  05/21/20  Yes [name], MD  furosemide (LASIX) 20 MG tablet TAKE 2 TABLETS BY MOUTH EVERY DAY Patient taking differently: Take 20 mg by mouth daily.  05/03/20  Yes Pleas Koch, NP  ipratropium-albuterol (DUONEB) 0.5-2.5 (3) MG/3ML SOLN Take 3 mLs by nebulization every 3 (three) hours as needed. Patient taking differently: Take 3 mLs by nebulization every 3 (three) hours as needed (shortness of breath/wheezing).  04/14/20  Yes Hunsucker, Bonna Gains, MD  lidocaine-prilocaine (EMLA) cream Apply 1 application topically as needed (prior to port access).   Yes [provider]  methylPREDNISolone (MEDROL DOSEPAK) 4 MG TBPK tablet Take it as directed. Patient taking differently: Take 4-24 mg by mouth as directed. Take 6 tablets (24 mg) on Day 1 Take 5 tablets (20 mg) on Day 2 Take 4 tablets (16 mg) on Day 3 Take 3 tablets (12 mg) on Day 4 Take 2 tablets (8 mg) on Day 5 Take 1 tablet (4 mg) on Day 6  06/02/20  Yes Shawna Clamp, MD  montelukast (SINGULAIR) 10 MG tablet Take 1 tablet (10 mg total) by mouth at bedtime. 12/31/19  Yes Pleas Koch, NP  potassium chloride SA (KLOR-CON M20) 20 MEQ tablet TAKE 1/2 TABLET BY MOUTH EVERY DAY Patient taking differently: Take 10 mEq by mouth daily.  05/18/20  Yes Pleas Koch, NP  traMADol (ULTRAM) 50 MG tablet Take 1 tablet (50 mg total) by mouth every 6 (six) hours as needed for moderate pain or severe pain. 04/08/20  Yes Alphonsa Overall, MD  OXYGEN Inhale 4 L/min into the lungs continuous.     [provider]  Spacer/Aero-Holding Chambers (AEROCHAMBER MV) inhaler Use as instructed 01/01/17   Parrett, Fonnie Mu, NP  prochlorperazine (COMPAZINE) 10 MG tablet Take 1 tablet (10 mg total) by mouth every 6 (six) hours as needed (Nausea or vomiting). 04/19/20 05/18/20  Nicholas Lose, MD     Critical care time: 54 min.      Julian Hy, DO 06/05/20 10:08 AM [facility]

## 2020-06-05 NOTE — Progress Notes (Signed)
Lab notified of order for 2 units PRBCs. Lab will call RN when blood is available for pickup.

## 2020-06-05 NOTE — Significant Event (Signed)
Patient had a further large liquid bloody bowel movement ago and patient was hypotensive with systolic in the 24O.  I have ordered normal saline bolus 1 L and 2 units of PRBC transfusion discussed with on-call interventional radiologist Dr. Anselm Pancoast, who has advised bleeding scan and further plans accordingly.  Will discuss with pulmonary critical care.  Deborah [name]

## 2020-06-05 NOTE — Progress Notes (Addendum)
[facility] Progress Note  CC:  Acute GI bleeding  Subjective:  Spoke with PCCM attending and patient's nurse.  She's received 4 units PRBCs.  Going to nuc med bleeding scan this AM, in fact, they are here to get her as I was seeing her.  Objective:  Vital signs in last 24 hours: Temp:  [97.5 F (36.4 C)-98.6 F (37 C)] 98.6 F (37 C) (11/20 0848) Pulse Rate:  [76-123] 98 (11/20 0848) Resp:  [13-29] 20 (11/20 0848) BP: (59-111)/(36-75) 104/66 (11/20 0848) SpO2:  [92 %-100 %] 93 % (11/20 0848) Weight:  [102.8 kg-104 kg] 104 kg (11/19 2100) Last BM Date: 06/04/20 General:  Alert, Well-developed, in NAD Heart:  Regular rate and rhythm; no murmurs Pulm:  Wheezing noted B/L anteriorly. Abdomen:  Soft, non-distended.  BS present.  Non-tender. Extremities:  Without edema. Neurologic:  Alert and oriented x 4;  grossly normal neurologically. Psych:  Alert and cooperative. Normal mood and affect.  Intake/Output from previous day: 11/19 0701 - 11/20 0700 In: 6155.5 [P.O.:480; I.V.:850.1; Blood:658; IV Piggyback:4167.5] Out: 650 [Urine:350; WNIOE:703]  Lab Results: Recent Labs    06/04/20 1550 06/04/20 1550 06/04/20 1624 06/05/20 0027 06/05/20 0332  WBC 40.3*  --   --  32.7* 34.4*  HGB 10.6*   < > 11.2* 10.3* 9.8*  HCT 35.6*   < > 33.0* 32.3* 31.1*  PLT 400  --   --  243 244   < > = values in this interval not displayed.   BMET Recent Labs    06/04/20 1550 06/04/20 1624 06/05/20 0350  NA 139 139 139  K 4.5 4.7 4.7  CL 98 98 104  CO2 31  --  27  GLUCOSE 198* 158* 135*  BUN 34* 32* 35*  CREATININE 1.03* 1.00 0.86  CALCIUM 8.0*  --  7.1*   LFT Recent Labs    06/04/20 1626 06/04/20 1626 06/05/20 0350  PROT 5.1*   < > 3.8*  ALBUMIN 2.4*   < > 2.0*  AST 26   < > 24  ALT 40   < > 36  ALKPHOS 67   < > 52  BILITOT 0.5   < > 0.7  BILIDIR 0.1  --   --   IBILI 0.4  --   --    < > = values in this interval not displayed.   PT/INR Recent Labs     06/04/20 1550 06/05/20 0350  LABPROT 13.9 15.1  INR 1.1 1.2   DG Chest Portable 1 View  Result Date: 06/04/2020 CLINICAL DATA:  Shortness of breath.  Bright red blood per rectum. EXAM: PORTABLE CHEST 1 VIEW COMPARISON:  One-view chest x-ray 05/26/2020 FINDINGS: Heart is enlarged. Right IJ Port-A-Cath is in place. Tip is stable in the right atrium. Aeration of both lungs has improved. There is some persistent retrocardiac opacity and probable left pleural effusion. IMPRESSION: 1. Improving aeration of both lungs. 2. Persistent retrocardiac opacity and probable left pleural effusion. Electronically Signed   By: San Morelle M.D.   On: 06/04/2020 17:02   CT Angio Abd/Pel W and/or Wo Contrast  Result Date: 06/04/2020 CLINICAL DATA:  Right red blood per rectum.  Hypertension. EXAM: CTA ABDOMEN AND PELVIS WITHOUT AND WITH CONTRAST TECHNIQUE: Multidetector CT imaging of the abdomen and pelvis was performed using the standard protocol during bolus administration of intravenous contrast. Multiplanar reconstructed images and MIPs were obtained and reviewed to evaluate the vascular anatomy. CONTRAST:  138mL OMNIPAQUE IOHEXOL  350 MG/ML SOLN COMPARISON:  None. FINDINGS: VASCULAR Aorta: Heavily calcified aorta.  No aneurysm or dissection. Celiac: Patent without evidence of aneurysm, dissection, vasculitis or significant stenosis. SMA: Patent without evidence of aneurysm, dissection, vasculitis or significant stenosis. Renals: Both renal arteries are patent without evidence of aneurysm, dissection, vasculitis, fibromuscular dysplasia or significant stenosis. IMA: Patent without evidence of aneurysm, dissection, vasculitis or significant stenosis. Inflow: Atherosclerotic calcifications.  No aneurysm or dissection. Proximal Outflow: Atherosclerotic calcifications. No aneurysm or dissection. Veins: No obvious venous abnormality within the limitations of this arterial phase study. Review of the MIP images  confirms the above findings. NON-VASCULAR Lower chest: Small left pleural effusion.  Right lung base clear. Hepatobiliary: There is pneumobilia. Gas noted in the gallbladder, common bile duct and intrahepatic ducts mostly in the left hepatic lobe. No focal hepatic abnormality. Pancreas: No focal abnormality or ductal dilatation. Spleen: Scattered calcifications in the spleen.  Normal size. Adrenals/Urinary Tract: No adrenal abnormality. No focal renal abnormality. No stones or hydronephrosis. Urinary bladder is unremarkable. Stomach/Bowel: Colonic diverticulosis diffusely. No active diverticulitis. Stomach and small bowel decompressed, unremarkable. No active contrast extravasation seen 2 localize GI bleed. Lymphatic: No adenopathy Reproductive: Prior hysterectomy.  No adnexal masses. Other: No free fluid or free air. Musculoskeletal: No acute bony abnormality. IMPRESSION: VASCULAR No active extravasation of contrast seen within the bowel to localize GI bleed. Diffuse aortoiliac atherosclerosis. NON-VASCULAR Small left pleural effusion. Pneumobilia, presumably related to prior sphincterotomy. Diffuse colonic diverticulosis.  No active diverticulitis. Electronically Signed   By: Rolm Baptise M.D.   On: 06/04/2020 18:53   Assessment / Plan: 1.  Acute diverticular bleed.  CT angio negative for active bleeding.  Colonoscopy in 2017 which revealed severe pandiverticulosis. 2.  Acute blood loss anemia:  Hgb 9.8 grams s/p 4 units PRBCs. 3.  Multiple medical problems  -Transfuse further prn. -Will await results of bleeding scan.  If positive for source then IR for possible angio.   LOS: 0 days   Laban Emperor. Zehr  06/05/2020, 8:55 AM  GI ATTENDING  Interval history data reviewed.  Agree with interval progress note as outlined patient patient continues with intermittent bleeding.  Has received 4 units of blood.  Hemoglobin 9.8.  Negative CTA last evening as previously noted.  Not for tagged cell scan.   Clinically this is a vigorous diverticular bleed.  If active bleeding demonstrated, she may need IR embolization therapy.  Docia Chuck. Geri [place]., M.D. [facility]

## 2020-06-06 DIAGNOSIS — K5731 Diverticulosis of large intestine without perforation or abscess with bleeding: Secondary | ICD-10-CM | POA: Diagnosis not present

## 2020-06-06 DIAGNOSIS — J449 Chronic obstructive pulmonary disease, unspecified: Secondary | ICD-10-CM

## 2020-06-06 DIAGNOSIS — J9611 Chronic respiratory failure with hypoxia: Secondary | ICD-10-CM

## 2020-06-06 DIAGNOSIS — K922 Gastrointestinal hemorrhage, unspecified: Secondary | ICD-10-CM | POA: Diagnosis not present

## 2020-06-06 DIAGNOSIS — D62 Acute posthemorrhagic anemia: Secondary | ICD-10-CM | POA: Diagnosis not present

## 2020-06-06 LAB — BPAM RBC
Blood Product Expiration Date: 202112032359
Blood Product Expiration Date: 202112042359
Blood Product Expiration Date: 202112052359
Blood Product Expiration Date: 202112052359
ISSUE DATE / TIME: 202111191720
ISSUE DATE / TIME: 202111192118
ISSUE DATE / TIME: 202111200555
ISSUE DATE / TIME: 202111200843
Unit Type and Rh: 6200
Unit Type and Rh: 6200
Unit Type and Rh: 6200
Unit Type and Rh: 6200

## 2020-06-06 LAB — CBC
HCT: 33.3 % — ABNORMAL LOW (ref 36.0–46.0)
Hemoglobin: 11 g/dL — ABNORMAL LOW (ref 12.0–15.0)
MCH: 30.1 pg (ref 26.0–34.0)
MCHC: 33 g/dL (ref 30.0–36.0)
MCV: 91.2 fL (ref 80.0–100.0)
Platelets: 152 10*3/uL (ref 150–400)
RBC: 3.65 MIL/uL — ABNORMAL LOW (ref 3.87–5.11)
RDW: 16.8 % — ABNORMAL HIGH (ref 11.5–15.5)
WBC: 21 10*3/uL — ABNORMAL HIGH (ref 4.0–10.5)
nRBC: 0.3 % — ABNORMAL HIGH (ref 0.0–0.2)

## 2020-06-06 LAB — GLUCOSE, CAPILLARY
Glucose-Capillary: 75 mg/dL (ref 70–99)
Glucose-Capillary: 58 mg/dL — ABNORMAL LOW (ref 70–99)
Glucose-Capillary: 66 mg/dL — ABNORMAL LOW (ref 70–99)
Glucose-Capillary: 114 mg/dL — ABNORMAL HIGH (ref 70–99)
Glucose-Capillary: 148 mg/dL — ABNORMAL HIGH (ref 70–99)
Glucose-Capillary: 95 mg/dL (ref 70–99)

## 2020-06-06 LAB — TYPE AND SCREEN
ABO/RH(D): A POS
Antibody Screen: NEGATIVE
Unit division: 0
Unit division: 0
Unit division: 0
Unit division: 0

## 2020-06-06 LAB — BASIC METABOLIC PANEL
Anion gap: 6 (ref 5–15)
BUN: 16 mg/dL (ref 8–23)
CO2: 29 mmol/L (ref 22–32)
Calcium: 7.3 mg/dL — ABNORMAL LOW (ref 8.9–10.3)
Chloride: 104 mmol/L (ref 98–111)
Creatinine, Ser: 0.55 mg/dL (ref 0.44–1.00)
GFR, Estimated: >60 mL/min (ref >60)
Glucose, Bld: 88 mg/dL (ref 70–99)
Potassium: 3.7 mmol/L (ref 3.5–5.1)
Sodium: 139 mmol/L (ref 135–145)

## 2020-06-06 LAB — URINE CULTURE

## 2020-06-06 LAB — HEMOGLOBIN AND HEMATOCRIT, BLOOD
HCT: 32.3 % — ABNORMAL LOW (ref 36.0–46.0)
HCT: 33.7 % — ABNORMAL LOW (ref 36.0–46.0)
Hemoglobin: 10.6 g/dL — ABNORMAL LOW (ref 12.0–15.0)
Hemoglobin: 11.1 g/dL — ABNORMAL LOW (ref 12.0–15.0)

## 2020-06-06 MED ORDER — POTASSIUM CHLORIDE CRYS ER 20 MEQ PO TBCR
40.0000 meq | EXTENDED_RELEASE_TABLET | Freq: Once | ORAL | Status: AC
  Administered 2020-06-06: 40 meq via ORAL
  Filled 2020-06-06: qty 2

## 2020-06-06 NOTE — Progress Notes (Signed)
NAME:  Deborah [name], MRN:  [id number], DOB:  November 11, 1955, LOS: 1 ADMISSION DATE:  06/04/2020, CONSULTATION DATE:  06/05/20 REFERRING MD:  Karleen Hampshire, CHIEF COMPLAINT:  LGIB, hypotension  Brief History   LGIB due to diverticulosis. Recent hospitalization for pneumonia. Has metastatic breast cancer for which she is undergoing chemo, chronic hypoxic respiratory failure due to COPD. She was undergoing therapy for breast cancer, but was intolerant and there are no plans to continue.  History of present illness   Deborah [name] is a 64 y/o woman with a history of COPD with chronic respiratory failure who was recently admitted for CAP who presented for acute BRBpR. She has had multiple episodes since this began yesterday. No abdominal pain. She has had this once before, but it self-resolved after only one episode. She denies constipation but reports she only had 2 BMs in her recent 11 day admission. Not taking AC or AP medications. She had lactic acidosis and hypotension responsive to IVF+ RBC transfusion. Negative CTA to localize source of bleeding. Tagged RBC scan ordered.   Past Medical History  COPD with chronic respiratory failure Breast cancer diverticulosis HTN Lung cancer in 2019- s/p chemo & radiation OSA; not using CPAP  Significant Hospital Events     Consults:  GI PCCM IR  Procedures:  CTA A/P with contrast> small left effusion, diffuse diverticulosis w/o active bleeding. No diverticulitis.  Significant Diagnostic Tests:    Micro Data:  Blood 11/19> Urine 11/19> polymicrobial covid negative  Antimicrobials:  Pip-tazo 11/20>  Interim history/subjective:  Two brown bowel movements overnight/this morning.  No pain.  Breathing is improved since yesterday with less wheezing.  Objective   Blood pressure (!) 101/48, pulse 71, temperature 97.7 F (36.5 C), temperature source Oral, resp. rate 18, height 5\' 2"  (1.575 m), weight 104 kg, SpO2 97 %.        Intake/Output Summary  (Last 24 hours) at 06/06/2020 0737 Last data filed at 06/06/2020 3295 Gross per 24 hour  Intake 3166.78 ml  Output 425 ml  Net 2741.78 ml   Filed Weights   06/04/20 1622 06/04/20 2100  Weight: 102.8 kg 104 kg    Examination: General: Chronically ill-appearing woman lying in bed no acute distress HENT: [place]/AT, eyes anicteric Lungs: Faint rhonchi on the right, no wheezing.  Breathing comfortably on 3.5 L nasal cannula. Cardiovascular: Regular rate and rhythm, no murmurs Abdomen: Obese, soft, nontender, nondistended Extremities: No clubbing, cyanosis, or edema Neuro: Awake and alert, moving all extremities spontaneously.  Answers all questions appropriately. Derm: Pallor, no rashes or bruising  Resolved Hospital Problem list     Assessment & Plan:  Acute LGIB due to diverticulosis-stable -appreciate IR & GI's assistance -CBC twice daily + PRN for bleeding or changes in hemodynamics.  Transfuse hemoglobin less than 7 or hemodynamically significant bleeding-TXA x 1 dose now -Advance diet to clear liquid -Continue to monitor clinically for bleeding  COPD Recent LLL CAP with parapneumonic effusion Chronic hypoxic respiratory failure -supplemental O2 to maintain SpO2 >90% -Continue triple inhaled therapy- Breo & Incruse while hospitalized -duonebs Q4h PRN  Nasal wound -mupirocen topical BID  Acute blood loss anemia 2/2 LGIB -con't transfusing to maintain Hb >7 and hemodynamic stability  Metastatic breast cancer, intolerant to chemo -Ongoing goals of care discussions are needed.  Palliative care consult is appropriate this admission.  Hypergylcemia -Accu-Cheks and sliding scale insulin -Goal BG 140-180 while in the ICU if requiring insulin  Best practice:  Per primary  Labs   CBC:  Recent Labs  Lab 06/04/20 1550 06/04/20 1624 06/05/20 0027 06/05/20 0027 06/05/20 0332 06/05/20 1212 06/05/20 1802 06/06/20 0000 06/06/20 0530  WBC 40.3*  --  32.7*  --  34.4* 27.5*   --   --   --   NEUTROABS 35.3*  --   --   --   --   --   --   --   --   HGB 10.6*   < > 10.3*   < > 9.8* 12.3 10.7* 11.1* 10.6*  HCT 35.6*   < > 32.3*   < > 31.1* 36.7 31.7* 33.7* 32.3*  MCV 93.7  --  93.4  --  94.0 89.3  --   --   --   PLT 400  --  243  --  244 165  --   --   --    < > = values in this interval not displayed.    Basic Metabolic Panel: Recent Labs  Lab 05/31/20 0401 05/31/20 0401 06/02/20 0500 06/04/20 1550 06/04/20 1624 06/05/20 0350 06/06/20 0500  NA 139   < > 139 139 139 139 139  K 4.5   < > 4.0 4.5 4.7 4.7 3.7  CL 95*   < > 98 98 98 104 104  CO2 36*  --  35* 31  --  27 29  GLUCOSE 198*   < > 205* 198* 158* 135* 88  BUN 19   < > 18 34* 32* 35* 16  CREATININE 0.66   < > 0.76 1.03* 1.00 0.86 0.55  CALCIUM 8.5*  --  8.4* 8.0*  --  7.1* 7.3*  MG 2.3  --   --   --   --   --   --   PHOS 4.6  --   --   --   --   --   --    < > = values in this interval not displayed.   GFR: Estimated Creatinine Clearance: 82.5 mL/min (by C-G formula based on SCr of 0.55 mg/dL). Recent Labs  Lab 06/04/20 1550 06/04/20 1701 06/04/20 2000 06/04/20 2336 06/05/20 0027 06/05/20 0236 06/05/20 0332 06/05/20 1212  WBC 40.3*  --   --   --  32.7*  --  34.4* 27.5*  LATICACIDVEN  --  3.1* 4.9* 1.5  --  1.4  --   --     Liver Function Tests: Recent Labs  Lab 06/04/20 1626 06/05/20 0350  AST 26 24  ALT 40 36  ALKPHOS 67 52  BILITOT 0.5 0.7  PROT 5.1* 3.8*  ALBUMIN 2.4* 2.0*   Recent Labs  Lab 06/04/20 1626  LIPASE 28   No results for input(s): AMMONIA in the last 168 hours.  ABG    Component Value Date/Time   PHART 7.310 (L) 05/24/2020 2120   PCO2ART 76.8 (HH) 05/24/2020 2120   PO2ART 68 (L) 05/24/2020 2120   HCO3 38.6 (H) 05/24/2020 2120   TCO2 30 06/04/2020 1624   O2SAT 90.0 05/24/2020 2120     Coagulation Profile: Recent Labs  Lab 06/04/20 1550 06/05/20 0350  INR 1.1 1.2    Cardiac Enzymes: No results for input(s): CKTOTAL, CKMB, CKMBINDEX,  TROPONINI in the last 168 hours.  HbA1C: Hemoglobin A1C  Date/Time Value Ref Range Status  11/12/2019 03:37 PM 5.5 4.0 - 5.6 % Final   Hgb A1c MFr Bld  Date/Time Value Ref Range Status  05/26/2020 05:05 AM 6.0 (H) 4.8 - 5.6 % Final    Comment:    (  NOTE) Pre diabetes:          5.7%-6.4%  Diabetes:              >6.4%  Glycemic control for   <7.0% adults with diabetes   07/25/2014 06:00 PM 6.2 (H) <5.7 % Final    Comment:    (NOTE)                                                                       According to the ADA Clinical Practice Recommendations for 2011, when HbA1c is used as a screening test:  >=6.5%   Diagnostic of Diabetes Mellitus           (if abnormal result is confirmed) 5.7-6.4%   Increased risk of developing Diabetes Mellitus References:Diagnosis and Classification of Diabetes Mellitus,Diabetes DDUK,0254,27(CWCBJ 1):S62-S69 and Standards of Medical Care in         Diabetes - 2011,Diabetes SEGB,1517,61 (Suppl 1):S11-S61.     CBG: Recent Labs  Lab 06/04/20 2310 06/05/20 1121 06/05/20 2333 06/06/20 0516  GLUCAP 138* 108* 109* [address] [place], DO 06/06/20 7:37 AM [facility]

## 2020-06-06 NOTE — Progress Notes (Addendum)
[facility] Progress Note  CC:  Acute GI bleeding  Subjective:  No further bleeding since yesterday before nuc med bleeding scan.  Objective:  Vital signs in last 24 hours: Temp:  [97.7 F (36.5 C)-98.8 F (37.1 C)] 98.1 F (36.7 C) (11/21 0800) Pulse Rate:  [67-99] 71 (11/21 0500) Resp:  [14-25] 18 (11/21 0500) BP: (95-130)/(48-75) 101/48 (11/21 0500) SpO2:  [93 %-99 %] 95 % (11/21 0830) Last BM Date: 06/05/20 General:  Alert, chronically ill-appearing, in NAD Heart:  Regular rate and rhythm; no murmurs Pulm:  Wheezing anteriorly B/L Abdomen:  Soft, non-distended.  BS present.  Non-tender.  Extremities:  Without edema. Neurologic:  Alert and oriented x 4;  grossly normal neurologically. Psych:  Alert and cooperative. Normal mood and affect.  Intake/Output from previous day: 11/20 0701 - 11/21 0700 In: 3166.8 [I.V.:2272.9; Blood:630; IV Piggyback:263.9] Out: 425 [Urine:225; Stool:200] Intake/Output this shift: Total I/O In: -  Out: 750 [Urine:750]  Lab Results: Recent Labs    06/05/20 0027 06/05/20 0027 06/05/20 0332 06/05/20 0332 06/05/20 1212 06/05/20 1212 06/05/20 1802 06/06/20 0000 06/06/20 0530  WBC 32.7*  --  34.4*  --  27.5*  --   --   --   --   HGB 10.3*   < > 9.8*   < > 12.3   < > 10.7* 11.1* 10.6*  HCT 32.3*   < > 31.1*   < > 36.7   < > 31.7* 33.7* 32.3*  PLT 243  --  244  --  165  --   --   --   --    < > = values in this interval not displayed.   BMET Recent Labs    06/04/20 1550 06/04/20 1550 06/04/20 1624 06/05/20 0350 06/06/20 0500  NA 139   < > 139 139 139  K 4.5   < > 4.7 4.7 3.7  CL 98   < > 98 104 104  CO2 31  --   --  27 29  GLUCOSE 198*   < > 158* 135* 88  BUN 34*   < > 32* 35* 16  CREATININE 1.03*   < > 1.00 0.86 0.55  CALCIUM 8.0*  --   --  7.1* 7.3*   < > = values in this interval not displayed.   LFT Recent Labs    06/04/20 1626 06/04/20 1626 06/05/20 0350  PROT 5.1*   < > 3.8*  ALBUMIN 2.4*   <  > 2.0*  AST 26   < > 24  ALT 40   < > 36  ALKPHOS 67   < > 52  BILITOT 0.5   < > 0.7  BILIDIR 0.1  --   --   IBILI 0.4  --   --    < > = values in this interval not displayed.   PT/INR Recent Labs    06/04/20 1550 06/05/20 0350  LABPROT 13.9 15.1  INR 1.1 1.2   NM GI Blood Loss  Result Date: 06/05/2020 CLINICAL DATA:  GI bleeding EXAM: NUCLEAR MEDICINE GASTROINTESTINAL BLEEDING SCAN TECHNIQUE: Sequential abdominal images were obtained following intravenous administration of Tc-37m labeled red blood cells. RADIOPHARMACEUTICALS:  19.6 mCi Tc-79m pertechnetate in-vitro labeled red cells. COMPARISON:  CT 06/04/2020 FINDINGS: No focal increased or progressive activity is visualized. Physiologic bladder activity. IMPRESSION: Negative examination. Electronically Signed   By: Donavan Foil M.D.   On: 06/05/2020 16:51   DG Chest Portable 1 View  Result  Date: 06/04/2020 CLINICAL DATA:  Shortness of breath.  Bright red blood per rectum. EXAM: PORTABLE CHEST 1 VIEW COMPARISON:  One-view chest x-ray 05/26/2020 FINDINGS: Heart is enlarged. Right IJ Port-A-Cath is in place. Tip is stable in the right atrium. Aeration of both lungs has improved. There is some persistent retrocardiac opacity and probable left pleural effusion. IMPRESSION: 1. Improving aeration of both lungs. 2. Persistent retrocardiac opacity and probable left pleural effusion. Electronically Signed   By: San Morelle M.D.   On: 06/04/2020 17:02   CT Angio Abd/Pel W and/or Wo Contrast  Result Date: 06/04/2020 CLINICAL DATA:  Right red blood per rectum.  Hypertension. EXAM: CTA ABDOMEN AND PELVIS WITHOUT AND WITH CONTRAST TECHNIQUE: Multidetector CT imaging of the abdomen and pelvis was performed using the standard protocol during bolus administration of intravenous contrast. Multiplanar reconstructed images and MIPs were obtained and reviewed to evaluate the vascular anatomy. CONTRAST:  169mL OMNIPAQUE IOHEXOL 350 MG/ML SOLN  COMPARISON:  None. FINDINGS: VASCULAR Aorta: Heavily calcified aorta.  No aneurysm or dissection. Celiac: Patent without evidence of aneurysm, dissection, vasculitis or significant stenosis. SMA: Patent without evidence of aneurysm, dissection, vasculitis or significant stenosis. Renals: Both renal arteries are patent without evidence of aneurysm, dissection, vasculitis, fibromuscular dysplasia or significant stenosis. IMA: Patent without evidence of aneurysm, dissection, vasculitis or significant stenosis. Inflow: Atherosclerotic calcifications.  No aneurysm or dissection. Proximal Outflow: Atherosclerotic calcifications. No aneurysm or dissection. Veins: No obvious venous abnormality within the limitations of this arterial phase study. Review of the MIP images confirms the above findings. NON-VASCULAR Lower chest: Small left pleural effusion.  Right lung base clear. Hepatobiliary: There is pneumobilia. Gas noted in the gallbladder, common bile duct and intrahepatic ducts mostly in the left hepatic lobe. No focal hepatic abnormality. Pancreas: No focal abnormality or ductal dilatation. Spleen: Scattered calcifications in the spleen.  Normal size. Adrenals/Urinary Tract: No adrenal abnormality. No focal renal abnormality. No stones or hydronephrosis. Urinary bladder is unremarkable. Stomach/Bowel: Colonic diverticulosis diffusely. No active diverticulitis. Stomach and small bowel decompressed, unremarkable. No active contrast extravasation seen 2 localize GI bleed. Lymphatic: No adenopathy Reproductive: Prior hysterectomy.  No adnexal masses. Other: No free fluid or free air. Musculoskeletal: No acute bony abnormality. IMPRESSION: VASCULAR No active extravasation of contrast seen within the bowel to localize GI bleed. Diffuse aortoiliac atherosclerosis. NON-VASCULAR Small left pleural effusion. Pneumobilia, presumably related to prior sphincterotomy. Diffuse colonic diverticulosis.  No active diverticulitis.  Electronically Signed   By: Rolm Baptise M.D.   On: 06/04/2020 18:53   Assessment / Plan: 1. Acute diverticular bleed. CT angio negative for active bleeding and then nuc med bleeding scan negative yesterday as well.  Colonoscopy in 2017 which revealed severe pandiverticulosis. 2.  Acute blood loss anemia:  Hgb stable at 10.6 grams this AM s/p 4 units PRBCs this admission. 3. Multiple medical problems  -Monitor Hgb and transfuse further prn. -Continue supportive care. -Advance diet as tolerated.   LOS: 1 day   Laban Emperor. Zehr  06/06/2020, 9:00 AM  GI ATTENDING  Interval history data reviewed.  Patient seen and examined.  Agree with interval progress note as outlined above.  Fortunately, the patient's bleeding seems to have stopped.  Her hemoglobin is stable at 10.6.  Agree with advancing diet as tolerated and continuing to monitor stools and hemoglobins for probable (hopefully self-limited) diverticular bleed.  We will continue to follow.  Docia Chuck. Geri [place]in[place]., M.D. [facility] Division of Gastroenterology

## 2020-06-06 NOTE — Progress Notes (Signed)
Manufacturing [employer] [facility]) Community Based Palliative Care       This patient has been referred to our palliative care services in the community.  [facility] will continue to follow for any discharge planning needs and to coordinate admission onto palliative care.   If you have questions or need assistance, please call [phone] or contact the hospital Liaison listed on AMION.     Thank you for the opportunity to participate in this patient's care.     Domenic Moras, BSN, RN [facility] Liaison   [phone] (24h on call)

## 2020-06-06 NOTE — Progress Notes (Signed)
Hypoglycemic Event  CBG: 66  Treatment: given 4 oz juice   Symptoms: none  Follow-up CBG: Time:  CBG Result: 114  Possible Reasons for Event: low PO intake  Comments/MD notified:     Sheronda Parran E Mckenzie Bove

## 2020-06-06 NOTE — Progress Notes (Signed)
PROGRESS NOTE    Deborah [name]  WRU:[id number] DOB: 28-Sep-1955 DOA: 06/04/2020 PCP: Pleas Koch, NP    Chief Complaint  Patient presents with  . Weakness  . Rectal Bleeding    Brief Narrative:  64 year old lady with prior history of breast cancer, lung cancer recently admitted for pneumonia with parapneumonic effusion requiring thoracentesis, antibiotics, chronic diastolic heart failure, chronic respiratory failure requiring 4 L of nasal cannula oxygen, sleep apnea presents to ED for recurrent GI bleed at home.  On arrival to ED patient was hypotensive with systolic blood pressure 78 mm Hg, .  CT angiogram of the abdomen and pelvis showed diverticulosis.  GI consulted.  Patient so far has underwent 4 units of PRBC transfusion  RBC tagged NM scan is negative for localized bleeding.  Pt reports no bloody bowel movements since yesterday morning.     Assessment & Plan:   Principal Problem:   Acute GI bleeding Active Problems:   CRLD (chronic restrictive lung disease) secondary to super morbid obesity with mild COPD component   Chronic diastolic heart failure (HCC)   Acute blood loss anemia   GI bleed   Acute GI bleed probably from a diverticular bleed.   s/p 4 units of PRBC transfusion, repeat hemoglobin stable around 10.6.  Transfuse to keep hemoglobin greater than 7. Check H&H every 6 hours.  Hold anticoagulation and aspirin. If she continues to bleed, will need IR embolization.  Negative RBC tagged scan.  GI on board , appreciate recommendations.    Chronic respiratory failure with hypoxia requiring 4 L of nasal cannula oxygen probably secondary to a combination of chronic diastolic heart failure, COPD, recent pneumonia with parapneumonic effusion Patient currently denies any respiratory symptoms. She appears euvolemic at this time.   Sleep apnea Continue with CPAP at bedtime.    Acute anemia of blood loss secondary to GI bleed Transfuse to keep hemoglobin  greater than 7. Hemoglobin around 10, stable.   Elevated lactic acid probably secondary to hypotension, dehydration, blood loss improved with blood transfusions.   Breast cancer and lung cancer Follow-up with oncology as recommended.     DVT prophylaxis: SCDs Code Status: Full code Family Communication: None at bedside.  Disposition:   Status is: Inpatient  Remains inpatient appropriate because:Ongoing diagnostic testing needed not appropriate for outpatient work up, IV treatments appropriate due to intensity of illness or inability to take PO and Inpatient level of care appropriate due to severity of illness   Dispo: The patient is from: Home              Anticipated d/c is to: Pending              Anticipated d/c date is: 2 days              Patient currently is not medically stable to d/c.       Consultants:   GI  PCCM  Procedures:  Nuclear medicine RBC tagged scan Antimicrobials: None  Subjective: No nausea, vomiting or abdominal pain.  One BM with brown stool.    Objective: Vitals:   06/06/20 0400 06/06/20 0500 06/06/20 0800 06/06/20 0830  BP: (!) 106/59 (!) 101/48    Pulse: 79 71    Resp: 18 18    Temp: 97.7 F (36.5 C)  98.1 F (36.7 C)   TempSrc: Oral  Oral   SpO2: 96% 97%  95%  Weight:      Height:  Intake/Output Summary (Last 24 hours) at 06/06/2020 0947 Last data filed at 06/06/2020 0800 Gross per 24 hour  Intake 2518.45 ml  Output 975 ml  Net 1543.45 ml   Filed Weights   06/04/20 1622 06/04/20 2100  Weight: 102.8 kg 104 kg    Examination:  General exam: Alert and oriented. Not in distress.  Respiratory system: clear to ausculation, no wheezing or rhonchi.  Cardiovascular system: S1S2, RRR, no JVD, no pedal edema.  Gastrointestinal system:Abdomen is soft, non tender non distended, bowel sounds wnl.  Central nervous system: Alert and oriented. Non focal.  Extremities: no pedal edema.  Skin: No rashes seen.  Psychiatry:   Mood is appropriate.     Data Reviewed: I have personally reviewed following labs and imaging studies  CBC: Recent Labs  Lab 06/04/20 1550 06/04/20 1624 06/05/20 0027 06/05/20 0027 06/05/20 0332 06/05/20 1212 06/05/20 1802 06/06/20 0000 06/06/20 0530  WBC 40.3*  --  32.7*  --  34.4* 27.5*  --   --   --   NEUTROABS 35.3*  --   --   --   --   --   --   --   --   HGB 10.6*   < > 10.3*   < > 9.8* 12.3 10.7* 11.1* 10.6*  HCT 35.6*   < > 32.3*   < > 31.1* 36.7 31.7* 33.7* 32.3*  MCV 93.7  --  93.4  --  94.0 89.3  --   --   --   PLT 400  --  243  --  244 165  --   --   --    < > = values in this interval not displayed.    Basic Metabolic Panel: Recent Labs  Lab 05/31/20 0401 05/31/20 0401 06/02/20 0500 06/04/20 1550 06/04/20 1624 06/05/20 0350 06/06/20 0500  NA 139   < > 139 139 139 139 139  K 4.5   < > 4.0 4.5 4.7 4.7 3.7  CL 95*   < > 98 98 98 104 104  CO2 36*  --  35* 31  --  27 29  GLUCOSE 198*   < > 205* 198* 158* 135* 88  BUN 19   < > 18 34* 32* 35* 16  CREATININE 0.66   < > 0.76 1.03* 1.00 0.86 0.55  CALCIUM 8.5*  --  8.4* 8.0*  --  7.1* 7.3*  MG 2.3  --   --   --   --   --   --   PHOS 4.6  --   --   --   --   --   --    < > = values in this interval not displayed.    GFR: Estimated Creatinine Clearance: 82.5 mL/min (by C-G formula based on SCr of 0.55 mg/dL).  Liver Function Tests: Recent Labs  Lab 06/04/20 1626 06/05/20 0350  AST 26 24  ALT 40 36  ALKPHOS 67 52  BILITOT 0.5 0.7  PROT 5.1* 3.8*  ALBUMIN 2.4* 2.0*    CBG: Recent Labs  Lab 06/04/20 2310 06/05/20 1121 06/05/20 2333 06/06/20 0516  GLUCAP 138* 108* 109* 75     Recent Results (from the past 240 hour(s))  Urine culture     Status: Abnormal   Collection Time: 06/04/20  4:24 PM   Specimen: Urine, Clean Catch  Result Value Ref Range Status   Specimen Description   Final    URINE, CLEAN CATCH Performed at [facility], [place] [address], [place], [place]  [address]    Special Requests   Final    NONE Performed at [facility], [place] [address], [place], [place] [address]    Culture MULTIPLE SPECIES PRESENT, SUGGEST RECOLLECTION (A)  Final   Report Status 06/06/2020 FINAL  Final  Blood culture (routine x 2)     Status: None (Preliminary result)   Collection Time: 06/04/20  4:25 PM   Specimen: BLOOD  Result Value Ref Range Status   Specimen Description   Final    BLOOD RIGHT ANTECUBITAL Performed at [place] [address] [place], [place] [address]    Special Requests   Final    BOTTLES DRAWN AEROBIC AND ANAEROBIC Blood Culture adequate volume Performed at [place] [address] [place], [place] [address]    Culture   Final    NO GROWTH < 24 HOURS Performed at [facility] [address] [place],  [address]    Report Status PENDING  Incomplete  Resp Panel by RT-PCR (Flu A&B, Covid) Nasopharyngeal Swab     Status: None   Collection Time: 06/04/20  5:01 PM   Specimen: Nasopharyngeal Swab; Nasopharyngeal(NP) swabs in vial transport medium  Result Value Ref Range Status   SARS Coronavirus 2 by RT PCR NEGATIVE NEGATIVE Final    Comment: (NOTE) SARS-CoV-2 target nucleic acids are NOT DETECTED.  The SARS-CoV-2 RNA is generally detectable in upper respiratory specimens during the acute phase of infection. The lowest concentration of SARS-CoV-2 viral copies this assay can detect is 138 copies/mL. A negative result does not preclude SARS-Cov-2 infection and should not be used as the sole basis for treatment or other patient management decisions. A negative result may occur with  improper specimen collection/handling, submission of specimen other than nasopharyngeal swab, presence of viral mutation(s) within the areas targeted by this assay, and inadequate number of viral copies(<138 copies/mL). A negative result must be combined with clinical observations,  patient history, and epidemiological information. The expected result is Negative.  Fact Sheet for Patients:  EntrepreneurPulse.com.au  Fact Sheet for Healthcare Providers:  IncredibleEmployment.be  This test is no t yet approved or cleared by the [place] FDA and  has been authorized for detection and/or diagnosis of SARS-CoV-2 by FDA under an Emergency Use Authorization (EUA). This EUA will remain  in effect (meaning this test can be used) for the duration of the COVID-19 declaration under Section 564(b)(1) of the Act, 21 U.S.C.section 360bbb-3(b)(1), unless the authorization is terminated  or revoked sooner.       Influenza A by PCR NEGATIVE NEGATIVE Final   Influenza B by PCR NEGATIVE NEGATIVE Final    Comment: (NOTE) The Xpert Xpress SARS-CoV-2/FLU/RSV plus assay is intended as an aid in the diagnosis of influenza from Nasopharyngeal swab specimens and should not be used as a sole basis for treatment. Nasal washings and aspirates are unacceptable for Xpert Xpress SARS-CoV-2/FLU/RSV testing.  Fact Sheet for Patients: EntrepreneurPulse.com.au  Fact Sheet for Healthcare Providers: IncredibleEmployment.be  This test is not yet approved or cleared by the [place] FDA and has been authorized for detection and/or diagnosis of SARS-CoV-2 by FDA under an Emergency Use Authorization (EUA). This EUA will remain in effect (meaning this test can be used) for the duration of the COVID-19 declaration under Section 564(b)(1) of the Act, 21 U.S.C. section 360bbb-3(b)(1), unless the authorization is terminated or revoked.  Performed at [facility], [address] [place], [place] [address]   MRSA PCR Screening     Status: None   Collection Time: 06/04/20  9:05 PM   Specimen: Nasal Mucosa; Nasopharyngeal  Result Value Ref Range Status   MRSA by PCR NEGATIVE NEGATIVE Final     Comment:        The GeneXpert MRSA Assay (FDA approved for NASAL specimens only), is one component of a comprehensive MRSA colonization surveillance program. It is not intended to diagnose MRSA infection nor to guide or monitor treatment for MRSA infections. Performed at [facility], [place] [address], [place], [place] [address]          Radiology Studies: NM GI Blood Loss  Result Date: 06/05/2020 CLINICAL DATA:  GI bleeding EXAM: NUCLEAR MEDICINE GASTROINTESTINAL BLEEDING SCAN TECHNIQUE: Sequential abdominal images were obtained following intravenous administration of Tc-22m labeled red blood cells. RADIOPHARMACEUTICALS:  19.6 mCi Tc-20m pertechnetate in-vitro labeled red cells. COMPARISON:  CT 06/04/2020 FINDINGS: No focal increased or progressive activity is visualized. Physiologic bladder activity. IMPRESSION: Negative examination. Electronically Signed   By: Donavan Foil M.D.   On: 06/05/2020 16:51   DG Chest Portable 1 View  Result Date: 06/04/2020 CLINICAL DATA:  Shortness of breath.  Bright red blood per rectum. EXAM: PORTABLE CHEST 1 VIEW COMPARISON:  One-view chest x-ray 05/26/2020 FINDINGS: Heart is enlarged. Right IJ Port-A-Cath is in place. Tip is stable in the right atrium. Aeration of both lungs has improved. There is some persistent retrocardiac opacity and probable left pleural effusion. IMPRESSION: 1. Improving aeration of both lungs. 2. Persistent retrocardiac opacity and probable left pleural effusion. Electronically Signed   By: San Morelle M.D.   On: 06/04/2020 17:02   CT Angio Abd/Pel W and/or Wo Contrast  Result Date: 06/04/2020 CLINICAL DATA:  Right red blood per rectum.  Hypertension. EXAM: CTA ABDOMEN AND PELVIS WITHOUT AND WITH CONTRAST TECHNIQUE: Multidetector CT imaging of the abdomen and pelvis was performed using the standard protocol during bolus administration of intravenous contrast. Multiplanar reconstructed images and  MIPs were obtained and reviewed to evaluate the vascular anatomy. CONTRAST:  111mL OMNIPAQUE IOHEXOL 350 MG/ML SOLN COMPARISON:  None. FINDINGS: VASCULAR Aorta: Heavily calcified aorta.  No aneurysm or dissection. Celiac: Patent without evidence of aneurysm, dissection, vasculitis or significant stenosis. SMA: Patent without evidence of aneurysm, dissection, vasculitis or significant stenosis. Renals: Both renal arteries are patent without evidence of aneurysm, dissection, vasculitis, fibromuscular dysplasia or significant stenosis. IMA: Patent without evidence of aneurysm, dissection, vasculitis or significant stenosis. Inflow: Atherosclerotic calcifications.  No aneurysm or dissection. Proximal Outflow: Atherosclerotic calcifications. No aneurysm or dissection. Veins: No obvious venous abnormality within the limitations of this arterial phase study. Review of the MIP images confirms the above findings. NON-VASCULAR Lower chest: Small left pleural effusion.  Right lung base clear. Hepatobiliary: There is pneumobilia. Gas noted in the gallbladder, common bile duct and intrahepatic ducts mostly in the left hepatic lobe. No focal hepatic abnormality. Pancreas: No focal abnormality or ductal dilatation. Spleen: Scattered calcifications in the spleen.  Normal size. Adrenals/Urinary Tract: No adrenal abnormality. No focal renal abnormality. No stones or hydronephrosis. Urinary bladder is unremarkable. Stomach/Bowel: Colonic diverticulosis diffusely. No active diverticulitis. Stomach and small bowel decompressed, unremarkable. No active contrast extravasation seen 2 localize GI bleed. Lymphatic: No adenopathy Reproductive: Prior hysterectomy.  No adnexal masses. Other: No free fluid or free air. Musculoskeletal: No acute bony abnormality. IMPRESSION: VASCULAR No active extravasation of contrast seen within the bowel to localize GI bleed.  Diffuse aortoiliac atherosclerosis. NON-VASCULAR Small left pleural effusion.  Pneumobilia, presumably related to prior sphincterotomy. Diffuse colonic diverticulosis.  No active diverticulitis. Electronically Signed   By: Rolm Baptise M.D.   On: 06/04/2020 18:53        Scheduled Meds: . sodium chloride   Intravenous Once  . Chlorhexidine Gluconate Cloth  6 each Topical Q0600  . fluticasone furoate-vilanterol  1 puff Inhalation Daily  . hydrocortisone sod succinate (SOLU-CORTEF) inj  50 mg Intravenous Q8H  . mouth rinse  15 mL Mouth Rinse BID  . mupirocin ointment   Topical BID  . sodium chloride flush  10-40 mL Intracatheter Q12H  . umeclidinium bromide  1 puff Inhalation Daily   Continuous Infusions: . sodium chloride Stopped (06/06/20 0307)  . sodium chloride    . piperacillin-tazobactam (ZOSYN)  IV Stopped (06/06/20 0703)  . vancomycin Stopped (06/06/20 0407)     LOS: 1 day        Hosie Poisson, MD [facility]   To contact the attending provider between 7A-7P or the covering provider during after hours 7P-7A, please log into the web site [email or web address] and access using universal [place] password for that web site. If you do not have the password, please call the hospital operator.  06/06/2020, 9:47 AM

## 2020-06-07 DIAGNOSIS — C50919 Malignant neoplasm of unspecified site of unspecified female breast: Secondary | ICD-10-CM

## 2020-06-07 DIAGNOSIS — D62 Acute posthemorrhagic anemia: Secondary | ICD-10-CM | POA: Diagnosis not present

## 2020-06-07 DIAGNOSIS — K5731 Diverticulosis of large intestine without perforation or abscess with bleeding: Secondary | ICD-10-CM | POA: Diagnosis not present

## 2020-06-07 DIAGNOSIS — J449 Chronic obstructive pulmonary disease, unspecified: Secondary | ICD-10-CM | POA: Diagnosis not present

## 2020-06-07 DIAGNOSIS — J9611 Chronic respiratory failure with hypoxia: Secondary | ICD-10-CM | POA: Diagnosis not present

## 2020-06-07 DIAGNOSIS — K922 Gastrointestinal hemorrhage, unspecified: Secondary | ICD-10-CM | POA: Diagnosis not present

## 2020-06-07 LAB — CBC
HCT: 31.2 % — ABNORMAL LOW (ref 36.0–46.0)
Hemoglobin: 10.1 g/dL — ABNORMAL LOW (ref 12.0–15.0)
MCH: 29.8 pg (ref 26.0–34.0)
MCHC: 32.4 g/dL (ref 30.0–36.0)
MCV: 92 fL (ref 80.0–100.0)
Platelets: 142 10*3/uL — ABNORMAL LOW (ref 150–400)
RBC: 3.39 MIL/uL — ABNORMAL LOW (ref 3.87–5.11)
RDW: 16.8 % — ABNORMAL HIGH (ref 11.5–15.5)
WBC: 14.1 10*3/uL — ABNORMAL HIGH (ref 4.0–10.5)
nRBC: 0.4 % — ABNORMAL HIGH (ref 0.0–0.2)

## 2020-06-07 LAB — BASIC METABOLIC PANEL
Anion gap: 8 (ref 5–15)
BUN: 6 mg/dL — ABNORMAL LOW (ref 8–23)
CO2: 30 mmol/L (ref 22–32)
Calcium: 7.4 mg/dL — ABNORMAL LOW (ref 8.9–10.3)
Chloride: 103 mmol/L (ref 98–111)
Creatinine, Ser: 0.56 mg/dL (ref 0.44–1.00)
GFR, Estimated: >60 mL/min (ref >60)
Glucose, Bld: 100 mg/dL — ABNORMAL HIGH (ref 70–99)
Potassium: 3.3 mmol/L — ABNORMAL LOW (ref 3.5–5.1)
Sodium: 141 mmol/L (ref 135–145)

## 2020-06-07 LAB — GLUCOSE, CAPILLARY
Glucose-Capillary: 109 mg/dL — ABNORMAL HIGH (ref 70–99)
Glucose-Capillary: 63 mg/dL — ABNORMAL LOW (ref 70–99)
Glucose-Capillary: 133 mg/dL — ABNORMAL HIGH (ref 70–99)
Glucose-Capillary: 83 mg/dL (ref 70–99)

## 2020-06-07 MED ORDER — POTASSIUM CHLORIDE 10 MEQ/50ML IV SOLN
10.0000 meq | INTRAVENOUS | Status: AC
  Administered 2020-06-07 (×6): 10 meq via INTRAVENOUS
  Filled 2020-06-07 (×6): qty 50

## 2020-06-07 MED ORDER — POTASSIUM CHLORIDE CRYS ER 20 MEQ PO TBCR
40.0000 meq | EXTENDED_RELEASE_TABLET | Freq: Once | ORAL | Status: AC
  Administered 2020-06-07: 40 meq via ORAL
  Filled 2020-06-07: qty 2

## 2020-06-07 NOTE — Progress Notes (Signed)
Pt refusing CPAP QHS at this time.

## 2020-06-07 NOTE — Progress Notes (Signed)
At 5:47am, patient BS=63, asymptomatic and resting calmly at bed. Given 4oz of apple juice. At 6:13am BS=109 when rechecked. Will continue to monitor the patient.

## 2020-06-07 NOTE — Evaluation (Signed)
Physical Therapy Evaluation Patient Details Name: Deborah [name] MRN: [id number] DOB: Apr 06, 1956 Today's Date: 06/07/2020   History of Present Illness  Ms. Caillier is a 64 y/o woman with a history of COPD with chronic respiratory failure who was recently admitted for CAP , [facility]. Presented for acute BRBpR x multiple episodes .LGIB due to diverticulosis. H/O breast cancer.  Clinical Impression  Patient eager to mobilize and go  Home. Patient  Assisted  From bed to [facility] and then ambulated 25'x 2 on 4 L [place]> Patient's HR 97 jumps to 137, then back to 109/ SPO2 94% on 4 L. While ambulating.  PATIENT REPORTS EXTREME DIFFICULTY GETTING UP 5 STEPS AT DC FROM [facility] 1 WEEK AGO. PATIENT REPORTS NEAR FALL WITH SPOUSE ASSISTING. RECOMMEND NON EMERGENCY TRANSPORT . Patient is at a level to get to BR/BSC and short distance ambulation while spouse at work. \  Pt admitted with above diagnosis.  Pt currently with functional limitations due to the deficits listed below (see PT Problem List). Pt will benefit from skilled PT to increase their independence and safety with mobility to allow discharge to the venue listed below.       Follow Up Recommendations Home health PT;Supervision/Assistance - 24 hour    Equipment Recommendations    none   Recommendations for Other Services       Precautions / Restrictions Precautions Precaution Comments: watch sats, HR jumps up, On 4 L at [place]      Mobility  Bed Mobility Overal bed mobility: Modified Independent                  Transfers Overall transfer level: Needs assistance Equipment used: Rolling walker (2 wheeled) Transfers: Sit to/from Stand Sit to Stand: Supervision Stand pivot transfers: Min guard       General transfer comment: transfer from bed to [facility] then recliner  Ambulation/Gait Ambulation/Gait assistance: Min guard Gait Distance (Feet): 25 Feet (x 2) Assistive device: Rolling walker (2 wheeled) Gait Pattern/deviations:  Step-through pattern;Decreased stride length;Trunk flexed Gait velocity: decr   General Gait Details: patient  ambulated short distance with RW, gait is steady  Financial trader Rankin (Stroke Patients Only)       Balance   Sitting-balance support: No upper extremity supported;Feet supported Sitting balance-Leahy Scale: Normal     Standing balance support: Bilateral upper extremity supported Standing balance-Leahy Scale: Fair Standing balance comment: reliant on external assist                              Pertinent Vitals/Pain Pain Assessment: No/denies pain    Home Living Family/patient expects to be discharged to:: Private residence Living Arrangements: Spouse/significant other Available Help at Discharge: Family;Available PRN/intermittently Type of Home: House Home Access: Stairs to enter Entrance Stairs-Rails: Right Entrance Stairs-Number of Steps: 4-6 Home Layout: One level Home Equipment: Walker - 2 wheels;Walker - 4 wheels;Shower seat;Transport chair Additional Comments: spouse works 12 hoer shift    Prior Production designer, theatre/television/film / Transfers Assistance Needed: since DC from [facility] 1 week ago, has used RW, [facility] beside her recliner oif unable to amb to BR while spouse at work.  ADL's / Homemaking Assistance Needed: spouse prepoares simple meals while he is away        Hand Dominance        Extremity/Trunk Assessment  Upper Extremity Assessment Upper Extremity Assessment: Generalized weakness    Lower Extremity Assessment Lower Extremity Assessment: Generalized weakness    Cervical / Trunk Assessment Cervical / Trunk Assessment: Normal  Communication   Communication: No difficulties  Cognition Arousal/Alertness: Awake/alert Behavior During Therapy: WFL for tasks assessed/performed Overall Cognitive Status: Within Functional Limits for tasks assessed                                         General Comments      Exercises     Assessment/Plan    PT Assessment Patient needs continued PT services  PT Problem List Decreased strength;Decreased mobility;Decreased activity tolerance;Cardiopulmonary status limiting activity       PT Treatment Interventions DME instruction;Therapeutic activities;Gait training;Therapeutic exercise;Patient/family education;Stair training;Balance training;Functional mobility training    PT Goals (Current goals can be found in the Care Plan section)  Acute Rehab PT Goals Patient Stated Goal: home today PT Goal Formulation: With patient/family Time For Goal Achievement: 06/21/20 Potential to Achieve Goals: Good    Frequency Min 3X/week   Barriers to discharge Inaccessible home environment was hardship and nearly feel on steps  entering after DC from [facility], needs nonemergency EMS    Co-evaluation               AM-PAC PT "6 Clicks" Mobility  Outcome Measure Help needed turning from your back to your side while in a flat bed without using bedrails?: None Help needed moving from lying on your back to sitting on the side of a flat bed without using bedrails?: None Help needed moving to and from a bed to a chair (including a wheelchair)?: A Little Help needed standing up from a chair using your arms (e.g., wheelchair or bedside chair)?: A Little Help needed to walk in hospital room?: A Little Help needed climbing 3-5 steps with a railing? : A Lot 6 Click Score: 19    End of Session Equipment Utilized During Treatment: Oxygen Activity Tolerance: Patient tolerated treatment well Patient left: in chair;with call bell/phone within reach Nurse Communication: Mobility status PT Visit Diagnosis: Muscle weakness (generalized) (M62.81);Difficulty in walking, not elsewhere classified (R26.2);Other abnormalities of gait and mobility (R26.89)    Time: 8329-1916 PT Time Calculation (min) (ACUTE ONLY): 44 min   Charges:   PT Evaluation $PT  Eval Low Complexity: 1 Low PT Treatments $Gait Training: 8-22 mins $Self Care/Home Management: [place] Pager [phone] Office [phone]   Claretha Cooper 06/07/2020, 9:21 AM

## 2020-06-07 NOTE — Progress Notes (Signed)
PROGRESS NOTE    Deborah [name]  FGH:[id number] DOB: 03-06-1956 DOA: 06/04/2020 PCP: Pleas Koch, NP    Chief Complaint  Patient presents with  . Weakness  . Rectal Bleeding    Brief Narrative:  64 year old lady with prior history of breast cancer, lung cancer recently admitted for pneumonia with parapneumonic effusion requiring thoracentesis, antibiotics, chronic diastolic heart failure, chronic respiratory failure requiring 4 L of nasal cannula oxygen, sleep apnea presents to ED for recurrent GI bleed at home.  On arrival to ED patient was hypotensive with systolic blood pressure 78 mm Hg, .  CT angiogram of the abdomen and pelvis showed diverticulosis.  GI consulted.  Patient so far has underwent 4 units of PRBC transfusion  RBC tagged NM scan is negative for localized bleeding.  Pt reports no bloody bowel movements since yesterday morning.     Assessment & Plan:   Principal Problem:   Acute GI bleeding Active Problems:   CRLD (chronic restrictive lung disease) secondary to super morbid obesity with mild COPD component   Chronic diastolic heart failure (HCC)   Acute blood loss anemia   GI bleed   Diverticulosis of colon with hemorrhage   Acute GI bleed probably from a diverticular bleed.  Self-limiting  s/p 4 units of PRBC transfusion, repeat hemoglobin stable around 10.6.  Transfuse to keep hemoglobin greater than 7. Check hemoglobin tomorrow, will start aspirin on discharge if no recurrent bleeding If she continues to bleed, will need IR embolization.  Negative RBC tagged scan.  GI on board , appreciate recommendations.  Started on clear liquids and advance to soft diet today.  If no bleeding overnight we will plan for discharge tomorrow   Chronic respiratory failure with hypoxia requiring 4 L of nasal cannula oxygen probably secondary to a combination of chronic diastolic heart failure, COPD, recent pneumonia with parapneumonic effusion Patient currently  denies any respiratory symptoms. She appears euvolemic at this time.   Sleep apnea Continue with CPAP at bedtime.    Acute anemia of blood loss secondary to GI bleed Transfuse to keep hemoglobin greater than 7. Hemoglobin around 10, stable.   Elevated lactic acid probably secondary to hypotension, dehydration, blood loss improved with blood transfusions.   Breast cancer and lung cancer Follow-up with oncology as recommended.    Hypokalemia Replaced.   Leukocytosis Unclear etiology probably reactive.  No source of infection found   Mild thrombocytopenia    DVT prophylaxis: SCDs Code Status: Full code Family Communication: None at bedside.  Disposition:   Status is: Inpatient  Remains inpatient appropriate because:Ongoing diagnostic testing needed not appropriate for outpatient work up, IV treatments appropriate due to intensity of illness or inability to take PO and Inpatient level of care appropriate due to severity of illness   Dispo: The patient is from: Home              Anticipated d/c is to: Home              Anticipated d/c date is: 1 day              Patient currently is not medically stable to d/c.       Consultants:   GI  PCCM  Procedures:  Nuclear medicine RBC tagged scan Antimicrobials: None  Subjective: 1 bowel movement today, dark nonbloody  Objective: Vitals:   06/07/20 0900 06/07/20 1000 06/07/20 1100 06/07/20 1202  BP:      Pulse: 77 89 85   Resp: Marland Kitchen)  27 (!) 22 20   Temp:    98 F (36.7 C)  TempSrc:    Oral  SpO2: 91% 98% 99% 97%  Weight:      Height:        Intake/Output Summary (Last 24 hours) at 06/07/2020 1437 Last data filed at 06/07/2020 1234 Gross per 24 hour  Intake 2637.43 ml  Output 3950 ml  Net -1312.57 ml   Filed Weights   06/04/20 1622 06/04/20 2100  Weight: 102.8 kg 104 kg    Examination:  General exam: Alert and oriented, no distress noted Respiratory system: Clear to auscultation bilaterally,  no wheezing or rhonchi Cardiovascular system: S1-S2 heard, regular rate rhythm, no pedal edema Gastrointestinal system: Abdomen is soft, nontender, nondistended bowel sounds normal Central nervous system: alert and oriented Extremities: No pedal edema Skin: No rashes seen Psychiatry: Mood is appropriate    Data Reviewed: I have personally reviewed following labs and imaging studies  CBC: Recent Labs  Lab 06/04/20 1550 06/04/20 1624 06/05/20 0027 06/05/20 0027 06/05/20 0332 06/05/20 0332 06/05/20 1212 06/05/20 1212 06/05/20 1802 06/06/20 0000 06/06/20 0530 06/06/20 1747 06/07/20 0505  WBC 40.3*   < > 32.7*  --  34.4*  --  27.5*  --   --   --   --  21.0* 14.1*  NEUTROABS 35.3*  --   --   --   --   --   --   --   --   --   --   --   --   HGB 10.6*   < > 10.3*   < > 9.8*   < > 12.3   < > 10.7* 11.1* 10.6* 11.0* 10.1*  HCT 35.6*   < > 32.3*   < > 31.1*   < > 36.7   < > 31.7* 33.7* 32.3* 33.3* 31.2*  MCV 93.7   < > 93.4  --  94.0  --  89.3  --   --   --   --  91.2 92.0  PLT 400   < > 243  --  244  --  165  --   --   --   --  152 142*   < > = values in this interval not displayed.    Basic Metabolic Panel: Recent Labs  Lab 06/02/20 0500 06/02/20 0500 06/04/20 1550 06/04/20 1624 06/05/20 0350 06/06/20 0500 06/07/20 0505  NA 139   < > 139 139 139 139 141  K 4.0   < > 4.5 4.7 4.7 3.7 3.3*  CL 98   < > 98 98 104 104 103  CO2 35*  --  31  --  27 29 30   GLUCOSE 205*   < > 198* 158* 135* 88 100*  BUN 18   < > 34* 32* 35* 16 6*  CREATININE 0.76   < > 1.03* 1.00 0.86 0.55 0.56  CALCIUM 8.4*  --  8.0*  --  7.1* 7.3* 7.4*   < > = values in this interval not displayed.    GFR: Estimated Creatinine Clearance: 82.5 mL/min (by C-G formula based on SCr of 0.56 mg/dL).  Liver Function Tests: Recent Labs  Lab 06/04/20 1626 06/05/20 0350  AST 26 24  ALT 40 36  ALKPHOS 67 52  BILITOT 0.5 0.7  PROT 5.1* 3.8*  ALBUMIN 2.4* 2.0*    CBG: Recent Labs  Lab 06/06/20 1819  06/06/20 2320 06/07/20 0547 06/07/20 0613 06/07/20 1206  GLUCAP 148* 95 63*  109* 133*     Recent Results (from the past 240 hour(s))  Urine culture     Status: Abnormal   Collection Time: 06/04/20  4:24 PM   Specimen: Urine, Clean Catch  Result Value Ref Range Status   Specimen Description   Final    URINE, CLEAN CATCH Performed at [facility], [place] [address], [place], [place] [address]    Special Requests   Final    NONE Performed at [facility], [place] [address], [place], [place] [address]    Culture MULTIPLE SPECIES PRESENT, SUGGEST RECOLLECTION (A)  Final   Report Status 06/06/2020 FINAL  Final  Blood culture (routine x 2)     Status: None (Preliminary result)   Collection Time: 06/04/20  4:25 PM   Specimen: BLOOD  Result Value Ref Range Status   Specimen Description   Final    BLOOD RIGHT ANTECUBITAL Performed at [place] [address] [place], [place] [address]    Special Requests   Final    BOTTLES DRAWN AEROBIC AND ANAEROBIC Blood Culture adequate volume Performed at [place] [address] [place], [place] [address]    Culture   Final    NO GROWTH 3 DAYS Performed at [facility], [place] [address] [place], [place] [address]    Report Status PENDING  Incomplete  Resp Panel by RT-PCR (Flu A&B, Covid) Nasopharyngeal Swab     Status: None   Collection Time: 06/04/20  5:01 PM   Specimen: Nasopharyngeal Swab; Nasopharyngeal(NP) swabs in vial transport medium  Result Value Ref Range Status   SARS Coronavirus 2 by RT PCR NEGATIVE NEGATIVE Final    Comment: (NOTE) SARS-CoV-2 target nucleic acids are NOT DETECTED.  The SARS-CoV-2 RNA is generally detectable in upper respiratory specimens during the acute phase of infection. The lowest concentration of SARS-CoV-2 viral copies this assay can detect is 138 copies/mL. A negative result does not preclude SARS-Cov-2 infection  and should not be used as the sole basis for treatment or other patient management decisions. A negative result may occur with  improper specimen collection/handling, submission of specimen other than nasopharyngeal swab, presence of viral mutation(s) within the areas targeted by this assay, and inadequate number of viral copies(<138 copies/mL). A negative result must be combined with clinical observations, patient history, and epidemiological information. The expected result is Negative.  Fact Sheet for Patients:  EntrepreneurPulse.com.au  Fact Sheet for Healthcare Providers:  IncredibleEmployment.be  This test is no t yet approved or cleared by the [place] FDA and  has been authorized for detection and/or diagnosis of SARS-CoV-2 by FDA under an Emergency Use Authorization (EUA). This EUA will remain  in effect (meaning this test can be used) for the duration of the COVID-19 declaration under Section 564(b)(1) of the Act, 21 U.S.C.section 360bbb-3(b)(1), unless the authorization is terminated  or revoked sooner.       Influenza A by PCR NEGATIVE NEGATIVE Final   Influenza B by PCR NEGATIVE NEGATIVE Final    Comment: (NOTE) The Xpert Xpress SARS-CoV-2/FLU/RSV plus assay is intended as an aid in the diagnosis of influenza from Nasopharyngeal swab specimens and should not be used as a sole basis for treatment. Nasal washings and aspirates are unacceptable for Xpert Xpress SARS-CoV-2/FLU/RSV testing.  Fact Sheet for Patients: EntrepreneurPulse.com.au  Fact Sheet for Healthcare Providers: IncredibleEmployment.be  This test is not yet approved or cleared by the [place] and has been authorized  for detection and/or diagnosis of SARS-CoV-2 by FDA under an Emergency Use Authorization (EUA). This EUA will remain in effect (meaning this test can be used) for the duration of the COVID-19 declaration  under Section 564(b)(1) of the Act, 21 U.S.C. section 360bbb-3(b)(1), unless the authorization is terminated or revoked.  Performed at [facility], [place] [address], [place], [place] [address]   MRSA PCR Screening     Status: None   Collection Time: 06/04/20  9:05 PM   Specimen: Nasal Mucosa; Nasopharyngeal  Result Value Ref Range Status   MRSA by PCR NEGATIVE NEGATIVE Final    Comment:        The GeneXpert MRSA Assay (FDA approved for NASAL specimens only), is one component of a comprehensive MRSA colonization surveillance program. It is not intended to diagnose MRSA infection nor to guide or monitor treatment for MRSA infections. Performed at [facility], [place] [address] [place], [place] [address]          Radiology Studies: NM GI Blood Loss  Result Date: 06/05/2020 CLINICAL DATA:  GI bleeding EXAM: NUCLEAR MEDICINE GASTROINTESTINAL BLEEDING SCAN TECHNIQUE: Sequential abdominal images were obtained following intravenous administration of Tc-68m labeled red blood cells. RADIOPHARMACEUTICALS:  19.6 mCi Tc-53m pertechnetate in-vitro labeled red cells. COMPARISON:  CT 06/04/2020 FINDINGS: No focal increased or progressive activity is visualized. Physiologic bladder activity. IMPRESSION: Negative examination. Electronically Signed   By: Donavan Foil M.D.   On: 06/05/2020 16:51        Scheduled Meds: . sodium chloride   Intravenous Once  . Chlorhexidine Gluconate Cloth  6 each Topical Q0600  . fluticasone furoate-vilanterol  1 puff Inhalation Daily  . mouth rinse  15 mL Mouth Rinse BID  . mupirocin ointment   Topical BID  . sodium chloride flush  10-40 mL Intracatheter Q12H  . umeclidinium bromide  1 puff Inhalation Daily   Continuous Infusions: . sodium chloride    . piperacillin-tazobactam (ZOSYN)  IV Stopped (06/07/20 0953)     LOS: 2 days        Hosie Poisson, MD [facility]   To contact the attending  provider between 7A-7P or the covering provider during after hours 7P-7A, please log into the web site [email or web address] and access using universal [place] password for that web site. If you do not have the password, please call the hospital operator.  06/07/2020, 2:37 PM

## 2020-06-07 NOTE — Progress Notes (Signed)
NAME:  GENELL THEDE, MRN:  [id number], DOB:  1956/06/21, LOS: 2 ADMISSION DATE:  06/04/2020, CONSULTATION DATE:  06/05/20 REFERRING MD:  Karleen Hampshire, CHIEF COMPLAINT:  LGIB, hypotension  Brief History   LGIB due to diverticulosis. Recent hospitalization for pneumonia. Has metastatic breast cancer for which she is undergoing chemo, chronic hypoxic respiratory failure due to COPD. She was undergoing therapy for breast cancer, but was intolerant and there are no plans to continue.  History of present illness   Ms. Dobrowolski is a 64 y/o woman with a history of COPD with chronic respiratory failure who was recently admitted for CAP who presented for acute BRBpR. She has had multiple episodes since this began yesterday. No abdominal pain. She has had this once before, but it self-resolved after only one episode. She denies constipation but reports she only had 2 BMs in her recent 11 day admission. Not taking AC or AP medications. She had lactic acidosis and hypotension responsive to IVF+ RBC transfusion. Negative CTA to localize source of bleeding. Tagged RBC scan ordered.   Past Medical History  COPD with chronic respiratory failure Breast cancer diverticulosis HTN Lung cancer in 2019- s/p chemo & radiation OSA; not using CPAP  Significant Hospital Events     Consults:  GI PCCM IR  Procedures:  CTA A/P with contrast> small left effusion, diffuse diverticulosis w/o active bleeding. No diverticulitis.  Significant Diagnostic Tests:    Micro Data:  Blood 11/19> Urine 11/19> polymicrobial covid negative  Antimicrobials:  Pip-tazo 11/20>  Interim history/subjective:  No issues overnight is comfortable  Objective   Blood pressure 137/76, pulse 80, temperature 98.1 F (36.7 C), temperature source Oral, resp. rate (Abnormal) 21, height 5\' 2"  (1.575 m), weight 104 kg, SpO2 96 %.        Intake/Output Summary (Last 24 hours) at 06/07/2020 0813 Last data filed at 06/07/2020  0553 Gross per 24 hour  Intake 3412.67 ml  Output 3950 ml  Net -537.33 ml   Filed Weights   06/04/20 1622 06/04/20 2100  Weight: 102.8 kg 104 kg    Examination: General 64 year old female now working with physical therapy denies pain HEENT normocephalic atraumatic no jugular venous distention appreciated Pulmonary: Currently on nasal cannula.  Does have expiratory wheezing but no accessory use Cardiac regular rate and rhythm Abdomen soft nontender  extremities warm dry with brisk capillary refill, does have edema in ankles and wrists Neuro awake oriented GU voids    Resolved Hospital Problem list     Assessment & Plan:   Acute blood loss anemia 2/2 LGIB (acute diverticular bleed) -Status post TXA, seen by gastroenterology, has had no further bleeding since 11/20.  Hemoglobin has drifted down almost 1 g, however no active bleeding noted.  She received a total of 4 units of blood this admission.  GI hopes bleeding should be self-limited Plan Continuing to advance diet Watch for evidence of bleeding A.m. CBC  COPD Recent LLL CAP with parapneumonic effusion Chronic hypoxic respiratory failure Plan Continue triple combo inhaled therapy with Breo and Incruse while hospitalized DuoNeb every 4 hours as needed Supplemental oxygen for pulse oximetry greater than 90%  Nasal wound Plan Continue topical ointment  Leukocytosis Likely reactive but did have recent diagnosed with pneumonia.  Trending down.  Empirically placed on Zosyn on day of admission Plan Day #4 of Zosyn, will complete 5 days  Mild hypokalemia Plan Replace and recheck as indicated  Metastatic breast cancer, intolerant to chemo -This will require ongoing goals of  care discussion Plan Per primary  Hypergylcemia: Was isolated, has had no further hyperglycemia Plan Could discontinue Accu-Cheks, will defer to primary Best practice:  Per primary  We will sign off  Erick Colace ACNP-BC [place] Pager # [phone] OR # [phone] if no answer

## 2020-06-07 NOTE — Progress Notes (Signed)
Progress Note  Chief Complaint:    Lower GI bleeding     ASSESSMENT / PLAN:    #  Painless hematochezia, likely diverticular hemorrhage. She has knonw pan diverticulosis. CTA and bleeding scan were negative. Bleeding has resolved. There is brown liquid stool in potty chair.  --From GI standpoint it is okay to advance diet.  # ABL anemia. Baseline hgb ( prior to bleed) was 10.2. Received 4 units blood this admission. Hgb 10.1 today.   # Leukocytosis, improving. WBC 34 >> 27 >> 21>> 14 today. Elevated likely reactive but also had recent pneumonia. Today is 3rd day of Zosyn. .   # COPD, on home 02.   # Metastatic breast cancer      SUBJECTIVE:   In bedside chair. No complaints. No further GI bleeding.      OBJECTIVE:    Scheduled inpatient medications:  . sodium chloride   Intravenous Once  . Chlorhexidine Gluconate Cloth  6 each Topical Q0600  . fluticasone furoate-vilanterol  1 puff Inhalation Daily  . hydrocortisone sod succinate (SOLU-CORTEF) inj  50 mg Intravenous Q8H  . mouth rinse  15 mL Mouth Rinse BID  . mupirocin ointment   Topical BID  . potassium chloride  40 mEq Oral Once  . sodium chloride flush  10-40 mL Intracatheter Q12H  . umeclidinium bromide  1 puff Inhalation Daily   Continuous inpatient infusions:  . sodium chloride Stopped (06/07/20 0749)  . sodium chloride    . piperacillin-tazobactam (ZOSYN)  IV 12.5 mL/hr at 06/07/20 0800  . potassium chloride 50 mL/hr at 06/07/20 0800   PRN inpatient medications: Place/Maintain arterial line **AND** sodium chloride, acetaminophen **OR** acetaminophen, sodium chloride flush  Vital signs in last 24 hours: Temp:  [97.5 F (36.4 C)-98.1 F (36.7 C)] 97.7 F (36.5 C) (11/22 0800) Pulse Rate:  [61-113] 72 (11/22 0800) Resp:  [16-25] 21 (11/22 0800) BP: (71-162)/(31-146) 132/70 (11/22 0800) SpO2:  [92 %-99 %] 97 % (11/22 0800) Last BM Date: 06/06/20  Intake/Output Summary (Last 24 hours) at 06/07/2020  0854 Last data filed at 06/07/2020 0800 Gross per 24 hour  Intake 3683.84 ml  Output 3950 ml  Net -266.16 ml     Physical Exam:  . General: Alert, female in NAD . Heart:  Regular rate and rhythm.  . Pulmonary: Normal respiratory effort on 02. Bilateral rhonchi and wheezing.  . Abdomen: Soft, nondistended, nontender. Normal bowel sounds.  . Neurologic: Alert and oriented . Psych: Pleasant. Cooperative.   Filed Weights   06/04/20 1622 06/04/20 2100  Weight: 102.8 kg 104 kg    Intake/Output from previous day: 11/21 0701 - 11/22 0700 In: 3412.7 [I.V.:3122.9; IV Piggyback:289.8] Out: 4700 [Urine:4700] Intake/Output this shift: Total I/O In: 271.2 [I.V.:238.5; IV Piggyback:32.7] Out: -     Lab Results: Recent Labs    06/05/20 1212 06/05/20 1802 06/06/20 0530 06/06/20 1747 06/07/20 0505  WBC 27.5*  --   --  21.0* 14.1*  HGB 12.3   < > 10.6* 11.0* 10.1*  HCT 36.7   < > 32.3* 33.3* 31.2*  PLT 165  --   --  152 142*   < > = values in this interval not displayed.   BMET Recent Labs    06/05/20 0350 06/06/20 0500 06/07/20 0505  NA 139 139 141  K 4.7 3.7 3.3*  CL 104 104 103  CO2 27 29 30   GLUCOSE 135* 88 100*  BUN 35* 16 6*  CREATININE 0.86 0.55  0.56  CALCIUM 7.1* 7.3* 7.4*   LFT Recent Labs    06/04/20 1626 06/04/20 1626 06/05/20 0350  PROT 5.1*   < > 3.8*  ALBUMIN 2.4*   < > 2.0*  AST 26   < > 24  ALT 40   < > 36  ALKPHOS 67   < > 52  BILITOT 0.5   < > 0.7  BILIDIR 0.1  --   --   IBILI 0.4  --   --    < > = values in this interval not displayed.   PT/INR Recent Labs    06/04/20 1550 06/05/20 0350  LABPROT 13.9 15.1  INR 1.1 1.2   Hepatitis Panel No results for input(s): HEPBSAG, HCVAB, HEPAIGM, HEPBIGM in the last 72 hours.  NM GI Blood Loss  Result Date: 06/05/2020 CLINICAL DATA:  GI bleeding EXAM: NUCLEAR MEDICINE GASTROINTESTINAL BLEEDING SCAN TECHNIQUE: Sequential abdominal images were obtained following intravenous administration  of Tc-59m labeled red blood cells. RADIOPHARMACEUTICALS:  19.6 mCi Tc-30m pertechnetate in-vitro labeled red cells. COMPARISON:  CT 06/04/2020 FINDINGS: No focal increased or progressive activity is visualized. Physiologic bladder activity. IMPRESSION: Negative examination. Electronically Signed   By: Donavan Foil M.D.   On: 06/05/2020 16:51      Principal Problem:   Acute GI bleeding Active Problems:   CRLD (chronic restrictive lung disease) secondary to super morbid obesity with mild COPD component   Chronic diastolic heart failure (HCC)   Acute blood loss anemia   GI bleed   Diverticulosis of colon with hemorrhage     LOS: 2 days   Tye Savoy ,NP 06/07/2020, 8:54 AM

## 2020-06-07 NOTE — TOC Initial Note (Signed)
Transition of Care [facility]) - Initial/Assessment Note    Patient Details  Name: Deborah [name] MRN: [id number] Date of Birth: Jan 09, 1956  Transition of Care [facility]) CM/SW Contact:    Leeroy Cha, RN Phone Number: 06/07/2020, 7:52 AM  Clinical Narrative:                 64 year old lady with prior history of breast cancer, lung cancer recently admitted for pneumonia with parapneumonic effusion requiring thoracentesis, antibiotics, chronic diastolic heart failure, chronic respiratory failure requiring 4 L of nasal cannula oxygen, sleep apnea presents to ED for recurrent GI bleed at home.  On arrival to ED patient was hypotensive with systolic blood pressure 78 mm Hg, .  CT angiogram of the abdomen and pelvis showed diverticulosis.  GI consulted.  Patient so far has underwent 4 units of PRBC transfusion  RBC tagged NM scan is negative for localized bleeding.  Pt reports no bloody bowel movements since yesterday morning.     Assessment & Plan:   Principal Problem:   Acute GI bleeding Active Problems:   CRLD (chronic restrictive lung disease) secondary to super morbid obesity with mild COPD component   Chronic diastolic heart failure (HCC)   Acute blood loss anemia   GI bleed   Acute GI bleed probably from a diverticular bleed.   s/p 4 units of PRBC transfusion, repeat hemoglobin stable around 10.6.  Transfuse to keep hemoglobin greater than 7. Check H&H every 6 hours.  Hold anticoagulation and aspirin. If she continues to bleed, will need IR embolization.  Negative RBC tagged scan.  GI on board , appreciate recommendations.  plan is to return to home  Following for progression o2 at 4l/min, iv zosyn. Central line k runs x6, k+=3.3, WBC-14.6  Expected Discharge Plan: [place]     Patient Goals and CMS Choice Patient states their goals for this hospitalization and ongoing recovery are:: to go home CMS Medicare.gov Compare Post Acute Care list  provided to:: Patient Choice offered to / list presented to : Patient  Expected Discharge Plan and Services Expected Discharge Plan: [place]   Discharge Planning Services: CM Consult Post Acute Care Choice: [place] arrangements for the past 2 months: Single Family Home                                      Prior Living Arrangements/Services Living arrangements for the past 2 months: Single Family Home Lives with:: Spouse Patient language and need for interpreter reviewed:: Yes Do you feel safe going back to the place where you live?: Yes      Need for Family Participation in Patient Care: Yes (Comment) Care giver support system in place?: Yes (comment)   Criminal Activity/Legal Involvement Pertinent to Current Situation/Hospitalization: No - Comment as needed  Activities of Daily Living Home Assistive Devices/Equipment: Eyeglasses, CPAP, Oxygen, Walker (specify type), Bedside commode/3-in-1, Shower chair with back ADL Screening (condition at time of admission) Patient's cognitive ability adequate to safely complete daily activities?: Yes Is the patient deaf or have difficulty hearing?: No Does the patient have difficulty seeing, even when wearing glasses/contacts?: No Does the patient have difficulty concentrating, remembering, or making decisions?: No Patient able to express need for assistance with ADLs?: Yes Does the patient have difficulty dressing or bathing?: Yes Independently performs ADLs?: No Communication: Independent Dressing (OT): Needs assistance Is this  a change from baseline?: Pre-admission baseline Grooming: Independent Feeding: Independent Bathing: Needs assistance Is this a change from baseline?: Pre-admission baseline Toileting: Needs assistance Is this a change from baseline?: Pre-admission baseline In/Out Bed: Needs assistance Is this a change from baseline?: Pre-admission baseline Walks in Home: Needs  assistance Is this a change from baseline?: Pre-admission baseline Does the patient have difficulty walking or climbing stairs?: Yes Weakness of Legs: Both Weakness of Arms/Hands: None  Permission Sought/Granted                  Emotional Assessment Appearance:: Appears stated age Attitude/Demeanor/Rapport: Engaged Affect (typically observed): Calm Orientation: : Oriented to Place, Oriented to Self, Oriented to  Time, Oriented to Situation Alcohol / Substance Use: Not Applicable Psych Involvement: No (comment)  Admission diagnosis:  Acute blood loss anemia [D62] Rectal bleeding [K62.5] Acute GI bleeding [K92.2] Lower GI bleed [K92.2] GI bleed [K92.2] Patient Active Problem List   Diagnosis Date Noted  . Diverticulosis of colon with hemorrhage   . GI bleed 06/05/2020  . Acute GI bleeding 06/04/2020  . Acute blood loss anemia 06/04/2020  . Acute hypoxemic respiratory failure ([place])   . Advanced care planning/counseling discussion   . Palliative care by specialist   . Acute on chronic respiratory failure with hypoxia ([place]) 05/24/2020  . Port-A-Cath in place 05/11/2020  . Malignant neoplasm of upper-outer quadrant of left breast in female, estrogen receptor negative ([place]) 02/25/2020  . Surgical counseling visit 02/23/2020  . Physical deconditioning 02/23/2020  . Rash and nonspecific skin eruption 12/16/2019  . Hyperglycemia 11/12/2019  . Palpitations 11/12/2019  . Encounter for antineoplastic immunotherapy 08/14/2018  . Primary malignant neoplasm of bronchus of left lower lobe ([place]) 05/02/2018  . Encounter for antineoplastic chemotherapy 05/02/2018  . Goals of care, counseling/discussion 05/02/2018  . Choledocholithiasis   . COPD (chronic obstructive pulmonary disease) ([place]) 01/01/2017  . Special screening for malignant neoplasms, colon   . Benign neoplasm of cecum   . Benign neoplasm of ascending colon   . Benign neoplasm of descending colon   . Benign neoplasm of  sigmoid colon   . Depression 04/20/2016  . GI bleeding 01/08/2016  . Pressure ulcer 01/08/2016  . Diverticulitis of colon 01/08/2016  . Severe sepsis ([place])   . COPD exacerbation ([place]) 12/18/2015  . Vitamin D deficiency 03/29/2015  . Fatigue 10/27/2014  . Chronic diastolic heart failure ([place]) 09/01/2014  . OSA (obstructive sleep apnea) 08/26/2014  . Hypoxemia   . Pulmonary HTN ([place])   . CHF, acute on chronic, unknown EF 07/25/2014  . CRLD (chronic restrictive lung disease) secondary to super morbid obesity with mild COPD component 07/25/2014  . Tobacco abuse disorder 07/25/2014  . Morbid obesity ([place]) 06/05/2013  . Dyspnea 04/17/2013  . Chronic respiratory failure ([place]) 04/17/2013  . Hypertension 04/09/2013  . Umbilical hernia    PCP:  Pleas Koch, NP Pharmacy:   [id number] - [place], [place] [address] Phone: [phone] Fax: [phone]     Social Determinants of Health (SDOH) Interventions    Readmission Risk Interventions No flowsheet data found.

## 2020-06-08 ENCOUNTER — Other Ambulatory Visit: Payer: BLUE CROSS/BLUE SHIELD

## 2020-06-08 ENCOUNTER — Ambulatory Visit: Payer: BLUE CROSS/BLUE SHIELD

## 2020-06-08 ENCOUNTER — Ambulatory Visit: Payer: BLUE CROSS/BLUE SHIELD | Admitting: Hematology and Oncology

## 2020-06-08 LAB — BASIC METABOLIC PANEL
Anion gap: 9 (ref 5–15)
BUN: <5 mg/dL — ABNORMAL LOW (ref 8–23)
CO2: 31 mmol/L (ref 22–32)
Calcium: 7.7 mg/dL — ABNORMAL LOW (ref 8.9–10.3)
Chloride: 100 mmol/L (ref 98–111)
Creatinine, Ser: 0.35 mg/dL — ABNORMAL LOW (ref 0.44–1.00)
GFR, Estimated: >60 mL/min (ref >60)
Glucose, Bld: 103 mg/dL — ABNORMAL HIGH (ref 70–99)
Potassium: 3.7 mmol/L (ref 3.5–5.1)
Sodium: 140 mmol/L (ref 135–145)

## 2020-06-08 LAB — HEMOGLOBIN AND HEMATOCRIT, BLOOD
HCT: 33.4 % — ABNORMAL LOW (ref 36.0–46.0)
Hemoglobin: 10.6 g/dL — ABNORMAL LOW (ref 12.0–15.0)

## 2020-06-08 LAB — GLUCOSE, CAPILLARY
Glucose-Capillary: 111 mg/dL — ABNORMAL HIGH (ref 70–99)
Glucose-Capillary: 69 mg/dL — ABNORMAL LOW (ref 70–99)
Glucose-Capillary: 87 mg/dL (ref 70–99)

## 2020-06-08 LAB — CREATININE, SERUM
Creatinine, Ser: 0.57 mg/dL (ref 0.44–1.00)
GFR, Estimated: >60 mL/min (ref >60)

## 2020-06-08 MED ORDER — HEPARIN SOD (PORK) LOCK FLUSH 100 UNIT/ML IV SOLN
500.0000 [IU] | INTRAVENOUS | Status: AC | PRN
  Administered 2020-06-08: 500 [IU]
  Filled 2020-06-08: qty 5

## 2020-06-08 NOTE — Progress Notes (Signed)
Pharmacy Antibiotic Note  Deborah [name] is a 64 y.o. female admitted on 06/04/2020 with hx of breast ca, lung ca, chronic tachycardia, CHF with preserved EF, COPD, chronic hypoxic respiratory failure on 4 L supplemental oxygen at baseline, hypertension, obstructive sleep apnea presents with GIB .  Pharmacy has been consulted for vancomycin and zosyn dosing for sepsis.  Received 1st doses in ED. Vanc was stopped on 11/21  Day 5 Zosyn Afebrile WBC improving SCr stable Cultures no growth to date  Plan: Continue Zosyn 3.375g IV Q8H infused over 4hrs. Daily Scr Follow renal function, cultures and clinical course Patient taking some PO meds including Tylenol - Change zosyn to Augmentin when possible  Height: 5\' 2"  (157.5 cm) Weight: 104 kg (229 lb 4.5 oz) IBW/kg (Calculated) : 50.1  Temp (24hrs), Avg:98 F (36.7 C), Min:97.8 F (36.6 C), Max:98.1 F (36.7 C)  Recent Labs  Lab 06/04/20 1550 06/04/20 1624 06/04/20 1701 06/04/20 2000 06/04/20 2336 06/05/20 0027 06/05/20 0236 06/05/20 0332 06/05/20 0350 06/05/20 1212 06/06/20 0500 06/06/20 1747 06/07/20 0505 06/08/20 0420  WBC   < >  --   --   --   --  32.7*  --  34.4*  --  27.5*  --  21.0* 14.1*  --   CREATININE  --  1.00  --   --   --   --   --   --  0.86  --  0.55  --  0.56 0.57  LATICACIDVEN  --   --  3.1* 4.9* 1.5  --  1.4  --   --   --   --   --   --   --    < > = values in this interval not displayed.    Estimated Creatinine Clearance: 82.5 mL/min (by C-G formula based on SCr of 0.57 mg/dL).    No Known Allergies  Antimicrobials this admission: 11/19 vanc >> 11/21 11/19 zosyn >>  Dose adjustments this admission:   Microbiology results: 11/19 BCx: ngtd 11/19  UCx: multiple species 11/19 MRSA PCR: neg  Thank you for allowing pharmacy to be a part of this patient's care.  Adrian Saran, PharmD, BCPS 06/08/2020 11:21 AM

## 2020-06-08 NOTE — Progress Notes (Signed)
AVS reviewed with patient. All new medications and follow up appointments discussed. Pt voiced understanding.  Chest port access being removed by IV team at this time. All pt belongings at bedside. Awaiting transport by EMS.

## 2020-06-08 NOTE — TOC Transition Note (Signed)
Transition of Care [facility]) - CM/SW Discharge Note   Patient Details  Name: Deborah [name] MRN: [id number] Date of Birth: Feb 20, 1956  [facility] CM/SW Contact:  Lennart Pall, LCSW Phone Number: 06/08/2020, 1:46 PM   Clinical Narrative:    Met with pt per MD order for assistance with transport home.  Pt confirms need for non-emergency transport due to physical assist needs/ debility/ home access issues.  [facility] has been called for pick up.  RN aware.  No further TOC needs.   Final next level of care: Home/Self Care Barriers to Discharge: Barriers Resolved   Patient Goals and CMS Choice Patient states their goals for this hospitalization and ongoing recovery are:: to go home CMS Medicare.gov Compare Post Acute Care list provided to:: Patient Choice offered to / list presented to : Patient  Discharge Placement                Patient to be transferred to facility by: [facility] to transport home Name of family member notified: spouse    Discharge Plan and Services   Discharge Planning Services: CM Consult Post Acute Care Choice: Home Health          DME Arranged: N/A DME Agency: NA                  Social Determinants of Health ([place]) Interventions     Readmission Risk Interventions Readmission Risk Prevention Plan 06/08/2020  Transportation Screening Complete  PCP or Specialist Appt within 5-7 Days Complete  Home Care Screening Complete  Medication Review (RN CM) Complete  Some recent data might be hidden

## 2020-06-09 ENCOUNTER — Telehealth: Payer: Self-pay

## 2020-06-09 LAB — CULTURE, BLOOD (ROUTINE X 2)
Culture: NO GROWTH
Special Requests: ADEQUATE

## 2020-06-09 NOTE — Telephone Encounter (Signed)
Spoke with patient's husband to schedule Palliative consult. Deborah [name] states patient was D/C'd from hospital yesterday. While patient was in the hospital her sister passed away. They are dealing with all that at this time. Deborah [name] requested to call back next week about scheduling consult appointment. I will reach out next week.

## 2020-06-16 NOTE — Discharge Summary (Signed)
Physician Discharge Summary  Deborah [name] KGU:[id number] DOB: 1956-04-11 DOA: 06/04/2020  PCP: Pleas Koch, NP  Admit date: 06/04/2020 Discharge date: 06/08/2020  Admitted From: Home.  Disposition: home.   Recommendations for Outpatient Follow-up:  1. Follow up with PCP in 1-2 weeks 2. Please obtain BMP/CBC in one week Please follow up with gastroenterology as needed.   Discharge Condition: stable  CODE STATUS: full code.  Diet recommendation: Heart Healthy / Carb Modified    Brief/Interim Summary: 64 year old lady with prior history of breast cancer, lung cancer recently admitted for pneumonia with parapneumonic effusion requiring thoracentesis, antibiotics, chronic diastolic heart failure, chronic respiratory failure requiring 4 L of nasal cannula oxygen, sleep apnea presents to ED for recurrent GI bleed at home.  On arrival to ED patient was hypotensive with systolic blood pressure 78 mm Hg, .  CT angiogram of the abdomen and pelvis showed diverticulosis.  GI consulted.  Patient so far has underwent 4 units of PRBC transfusion  RBC tagged NM scan is negative for localized bleeding.  Pt hemoglobin is stable and she is cleared for discharge.     Discharge Diagnoses:  Principal Problem:   Acute GI bleeding Active Problems:   CRLD (chronic restrictive lung disease) secondary to super morbid obesity with mild COPD component   Chronic diastolic heart failure (HCC)   Acute blood loss anemia   GI bleed   Diverticulosis of colon with hemorrhage  Acute GI bleed probably from a diverticular bleed.  Self-limiting  s/p 4 units of PRBC transfusion, repeat hemoglobin stable around 10.6.  Transfuse to keep hemoglobin greater than 7. Check hemoglobin tomorrow, will start aspirin on discharge if no recurrent bleeding If she continues to bleed, will need IR embolization.  Negative RBC tagged scan.  GI on board , appreciate recommendations.  Started on clear liquids and advance to  soft diet today.  No bleeding overnight she was cleared for discharge.    Chronic respiratory failure with hypoxia requiring 4 L of nasal cannula oxygen probably secondary to a combination of chronic diastolic heart failure, COPD, recent pneumonia with parapneumonic effusion Patient currently denies any respiratory symptoms. She appears euvolemic at this time.   Sleep apnea Continue with CPAP at bedtime.    Acute anemia of blood loss secondary to GI bleed Transfuse to keep hemoglobin greater than 7. Hemoglobin around 10, stable.   Elevated lactic acid probably secondary to hypotension, dehydration, blood loss improved with blood transfusions.   Breast cancer and lung cancer Follow-up with oncology as recommended.    Hypokalemia Replaced.   Leukocytosis Unclear etiology probably reactive.  No source of infection found   Mild thrombocytopenia Stable.    Discharge Instructions  Discharge Instructions    Diet - low sodium heart healthy   Complete by: As directed    Discharge instructions   Complete by: As directed    Please follow up with PCP and recheck CBC in one week.     Allergies as of 06/08/2020   No Known Allergies     Medication List    STOP taking these medications   methylPREDNISolone 4 MG Tbpk tablet Commonly known as: MEDROL DOSEPAK     TAKE these medications   acetaminophen 325 MG tablet Commonly known as: TYLENOL Take 650 mg by mouth every 6 (six) hours as needed for headache (pain).   AeroChamber MV inhaler Use as instructed   albuterol 108 (90 Base) MCG/ACT inhaler Commonly known as: VENTOLIN HFA Inhale 2 puffs into the  lungs every 6 (six) hours as needed for wheezing or shortness of breath.   aspirin 81 MG chewable tablet Chew 1 tablet (81 mg total) by mouth daily.   BC FAST PAIN RELIEF ARTHRITIS PO Take 1 packet by mouth daily as needed (back pain).   Breztri Aerosphere 160-9-4.8 MCG/ACT Aero Generic drug:  Budeson-Glycopyrrol-Formoterol Inhale 2 puffs into the lungs daily. What changed:   when to take this  reasons to take this   diltiazem 240 MG 24 hr capsule Commonly known as: CARDIZEM CD TAKE 1 CAPSULE BY MOUTH EVERY DAY What changed: how much to take   furosemide 20 MG tablet Commonly known as: LASIX TAKE 2 TABLETS BY MOUTH EVERY DAY What changed: how much to take   ipratropium-albuterol 0.5-2.5 (3) MG/3ML Soln Commonly known as: DUONEB Take 3 mLs by nebulization every 3 (three) hours as needed. What changed: reasons to take this   lidocaine-prilocaine cream Commonly known as: EMLA Apply 1 application topically as needed (prior to port access).   montelukast 10 MG tablet Commonly known as: SINGULAIR Take 1 tablet (10 mg total) by mouth at bedtime.   OXYGEN Inhale 4 L/min into the lungs continuous.   potassium chloride SA 20 MEQ tablet Commonly known as: Klor-Con M20 TAKE 1/2 TABLET BY MOUTH EVERY DAY What changed:   how much to take  how to take this  when to take this  additional instructions   traMADol 50 MG tablet Commonly known as: Ultram Take 1 tablet (50 mg total) by mouth every 6 (six) hours as needed for moderate pain or severe pain.       No Known Allergies  Consultations:  Gastroenterology  PCCM.    Procedures/Studies: NM GI Blood Loss  Result Date: 06/05/2020 CLINICAL DATA:  GI bleeding EXAM: NUCLEAR MEDICINE GASTROINTESTINAL BLEEDING SCAN TECHNIQUE: Sequential abdominal images were obtained following intravenous administration of Tc-35m labeled red blood cells. RADIOPHARMACEUTICALS:  19.6 mCi Tc-69m pertechnetate in-vitro labeled red cells. COMPARISON:  CT 06/04/2020 FINDINGS: No focal increased or progressive activity is visualized. Physiologic bladder activity. IMPRESSION: Negative examination. Electronically Signed   By: Donavan Foil M.D.   On: 06/05/2020 16:51   DG Chest Portable 1 View  Result Date: 06/04/2020 CLINICAL DATA:   Shortness of breath.  Bright red blood per rectum. EXAM: PORTABLE CHEST 1 VIEW COMPARISON:  One-view chest x-ray 05/26/2020 FINDINGS: Heart is enlarged. Right IJ Port-A-Cath is in place. Tip is stable in the right atrium. Aeration of both lungs has improved. There is some persistent retrocardiac opacity and probable left pleural effusion. IMPRESSION: 1. Improving aeration of both lungs. 2. Persistent retrocardiac opacity and probable left pleural effusion. Electronically Signed   By: San Morelle M.D.   On: 06/04/2020 17:02   DG Chest Port 1 View  Result Date: 05/26/2020 CLINICAL DATA:  Pneumonia EXAM: PORTABLE CHEST 1 VIEW COMPARISON:  Yesterday FINDINGS: Unchanged extensive opacification of the left chest with volume loss. Relatively clear right lung although there are bronchograms at the medial right base. Stable heart size accentuated by mediastinal fat based on most recent CT. Porta catheter on the right with tip at the upper right atrium. IMPRESSION: Stable left more than right infiltrates. Electronically Signed   By: Monte Fantasia M.D.   On: 05/26/2020 07:57   DG Chest Port 1 View  Result Date: 05/25/2020 CLINICAL DATA:  Status post thoracentesis EXAM: PORTABLE CHEST 1 VIEW COMPARISON:  05/24/2020 FINDINGS: Cardiac shadow is stable. Aortic calcifications are again seen. Slight reduction in left  pleural effusion is noted without evidence of pneumothorax. Patchy infiltrate remains in the left lung. Right chest wall port is again seen. IMPRESSION: No pneumothorax following thoracentesis. Persistent left-sided infiltrate is noted. Electronically Signed   By: Inez Catalina M.D.   On: 05/25/2020 13:20   DG Chest Port 1 View  Result Date: 05/24/2020 CLINICAL DATA:  64 year old female with shortness of breath. EXAM: PORTABLE CHEST 1 VIEW COMPARISON:  Chest radiograph dated 04/19/2018 and CT dated 01/26/2020. FINDINGS: Right-sided Port-A-Cath with tip close to the cavoatrial junction. There is  small to moderate left pleural effusion with collapse of the majority of the left lung. There is diffuse interstitial and streaky density in the aerated portion of the left lung which may represent atelectasis, asymmetric edema, or pneumonia. Clinical correlation is recommended. There is background of emphysema. Faint right lung base reticulonodular densities, likely atelectasis. There is no pneumothorax. Stable cardiomediastinal silhouette. Atherosclerotic calcification of the aorta. No acute osseous pathology. IMPRESSION: 1. Small to moderate left pleural effusion with collapse of the majority of the left lung. 2. Diffuse interstitial and streaky density in the aerated portion of the left lung may represent atelectasis, asymmetric edema, or pneumonia. Electronically Signed   By: Anner Crete M.D.   On: 05/24/2020 17:59   ECHOCARDIOGRAM COMPLETE  Result Date: 05/25/2020    ECHOCARDIOGRAM REPORT   Patient Name:   BLANCA CARREON Date of Exam: 05/25/2020 Medical Rec #:  [id number]        Height:       62.0 in Accession #:    [id number]       Weight:       238.1 lb Date of Birth:  [birth year]       BSA:          2.059 m Patient Age:    64 years         BP:           108/61 mmHg Patient Gender: F                HR:           119 bpm. Exam Location:  Inpatient Procedure: 2D Echo Indications:    Dyspnea R06.00  History:        Patient has prior history of Echocardiogram examinations, most                 recent 01/27/2020. CHF, COPD; Risk Factors:Hypertension and                 Former Smoker.  Sonographer:    Mikki Santee RDCS (AE) Referring Phys: [id number] [place]  1. Left ventricular ejection fraction, by estimation, is 55 to 60%. The left ventricle has normal function. The left ventricle has no regional wall motion abnormalities. Indeterminate diastolic filling due to E-A fusion.  2. Right ventricular systolic function is normal. The right ventricular size is normal. There is normal  pulmonary artery systolic pressure. The estimated right ventricular systolic pressure is 25.4 mmHg.  3. The mitral valve is normal in structure. No evidence of mitral valve regurgitation. No evidence of mitral stenosis.  4. The aortic valve is normal in structure. Aortic valve regurgitation is not visualized. No aortic stenosis is present.  5. The inferior vena cava is normal in size with greater than 50% respiratory variability, suggesting right atrial pressure of 3 mmHg. Comparison(s): No significant change from prior study. Prior images reviewed side by side. FINDINGS  Left Ventricle: Left  ventricular ejection fraction, by estimation, is 55 to 60%. The left ventricle has normal function. The left ventricle has no regional wall motion abnormalities. The left ventricular internal cavity size was normal in size. There is  no left ventricular hypertrophy. Indeterminate diastolic filling due to E-A fusion. (better information may be obtained when heart rate is normal). Right Ventricle: The right ventricular size is normal. No increase in right ventricular wall thickness. Right ventricular systolic function is normal. There is normal pulmonary artery systolic pressure. The tricuspid regurgitant velocity is 2.83 m/s, and  with an assumed right atrial pressure of 3 mmHg, the estimated right ventricular systolic pressure is 31.5 mmHg. Left Atrium: Left atrial size was normal in size. Right Atrium: Right atrial size was normal in size. Pericardium: There is no evidence of pericardial effusion. Mitral Valve: The mitral valve is normal in structure. No evidence of mitral valve regurgitation. No evidence of mitral valve stenosis. Tricuspid Valve: The tricuspid valve is normal in structure. Tricuspid valve regurgitation is mild . No evidence of tricuspid stenosis. Aortic Valve: The aortic valve is normal in structure. Aortic valve regurgitation is not visualized. No aortic stenosis is present. Pulmonic Valve: The pulmonic  valve was normal in structure. Pulmonic valve regurgitation is not visualized. No evidence of pulmonic stenosis. Aorta: The aortic root is normal in size and structure. Venous: The inferior vena cava is normal in size with greater than 50% respiratory variability, suggesting right atrial pressure of 3 mmHg. IAS/Shunts: No atrial level shunt detected by color flow Doppler.  LEFT VENTRICLE PLAX 2D LVIDd:         4.20 cm LVIDs:         2.70 cm LV PW:         1.00 cm LV IVS:        1.00 cm LVOT diam:     2.10 cm LV SV:         57 LV SV Index:   28 LVOT Area:     3.46 cm  RIGHT VENTRICLE RV S prime:     19.70 cm/s TAPSE (M-mode): 1.6 cm LEFT ATRIUM           Index       RIGHT ATRIUM           Index LA diam:      2.90 cm 1.41 cm/m  RA Area:     14.00 cm LA Vol (A4C): 47.0 ml 22.83 ml/m RA Volume:   31.80 ml  15.45 ml/m  AORTIC VALVE LVOT Vmax:   105.00 cm/s LVOT Vmean:  74.300 cm/s LVOT VTI:    0.166 m  AORTA Ao Root diam: 2.70 cm TRICUSPID VALVE TR Peak grad:   32.0 mmHg TR Vmax:        283.00 cm/s  SHUNTS Systemic VTI:  0.17 m Systemic Diam: 2.10 cm Dani Gobble Croitoru MD Electronically signed by Sanda Klein MD Signature Date/Time: 05/25/2020/3:58:39 PM    Final    CT Angio Abd/Pel W and/or Wo Contrast  Result Date: 06/04/2020 CLINICAL DATA:  Right red blood per rectum.  Hypertension. EXAM: CTA ABDOMEN AND PELVIS WITHOUT AND WITH CONTRAST TECHNIQUE: Multidetector CT imaging of the abdomen and pelvis was performed using the standard protocol during bolus administration of intravenous contrast. Multiplanar reconstructed images and MIPs were obtained and reviewed to evaluate the vascular anatomy. CONTRAST:  150mL OMNIPAQUE IOHEXOL 350 MG/ML SOLN COMPARISON:  None. FINDINGS: VASCULAR Aorta: Heavily calcified aorta.  No aneurysm or dissection. Celiac: Patent without evidence  of aneurysm, dissection, vasculitis or significant stenosis. SMA: Patent without evidence of aneurysm, dissection, vasculitis or significant  stenosis. Renals: Both renal arteries are patent without evidence of aneurysm, dissection, vasculitis, fibromuscular dysplasia or significant stenosis. IMA: Patent without evidence of aneurysm, dissection, vasculitis or significant stenosis. Inflow: Atherosclerotic calcifications.  No aneurysm or dissection. Proximal Outflow: Atherosclerotic calcifications. No aneurysm or dissection. Veins: No obvious venous abnormality within the limitations of this arterial phase study. Review of the MIP images confirms the above findings. NON-VASCULAR Lower chest: Small left pleural effusion.  Right lung base clear. Hepatobiliary: There is pneumobilia. Gas noted in the gallbladder, common bile duct and intrahepatic ducts mostly in the left hepatic lobe. No focal hepatic abnormality. Pancreas: No focal abnormality or ductal dilatation. Spleen: Scattered calcifications in the spleen.  Normal size. Adrenals/Urinary Tract: No adrenal abnormality. No focal renal abnormality. No stones or hydronephrosis. Urinary bladder is unremarkable. Stomach/Bowel: Colonic diverticulosis diffusely. No active diverticulitis. Stomach and small bowel decompressed, unremarkable. No active contrast extravasation seen 2 localize GI bleed. Lymphatic: No adenopathy Reproductive: Prior hysterectomy.  No adnexal masses. Other: No free fluid or free air. Musculoskeletal: No acute bony abnormality. IMPRESSION: VASCULAR No active extravasation of contrast seen within the bowel to localize GI bleed. Diffuse aortoiliac atherosclerosis. NON-VASCULAR Small left pleural effusion. Pneumobilia, presumably related to prior sphincterotomy. Diffuse colonic diverticulosis.  No active diverticulitis. Electronically Signed   By: Rolm Baptise M.D.   On: 06/04/2020 18:53   IR THORACENTESIS ASP PLEURAL SPACE W/IMG GUIDE  Result Date: 05/25/2020 INDICATION: Patient with history of lung cancer, breast cancer with emphysema and chronic hypoxic respiratory failure found to have  a left-sided pleural effusion. Request is for therapeutic and diagnostic left-sided thoracentesis EXAM: ULTRASOUND GUIDED THERAPEUTIC AND DIAGNOSTIC THORACENTESIS MEDICATIONS: LIDOCAINE 1% 10 ML COMPLICATIONS: None immediate. PROCEDURE: An ultrasound guided thoracentesis was thoroughly discussed with the patient and questions answered. The benefits, risks, alternatives and complications were also discussed. The patient understands and wishes to proceed with the procedure. Written consent was obtained. Ultrasound was performed to localize and mark an adequate pocket of fluid in the left chest. The area was then prepped and draped in the normal sterile fashion. 1% Lidocaine was used for local anesthesia. Under ultrasound guidance a 6 Fr Safe-T-Centesis catheter was introduced. Thoracentesis was performed. The catheter was removed and a dressing applied. The patient unable to maintain position and the procedure was aborted. FINDINGS: A total of approximately 520 mL of straw-colored fluid was removed. Samples were sent to the laboratory as requested by the clinical team. IMPRESSION: Successful ultrasound guided therapeutic and diagnostic left-sided thoracentesis yielding 520 mL of pleural fluid. Read by: Rushie Nyhan, NP Electronically Signed   By: Jacqulynn Cadet M.D.   On: 05/25/2020 13:42      Subjective: No new complaints.   Discharge Exam: Vitals:   06/08/20 0602 06/08/20 1320  BP: 129/73 124/74  Pulse: 83 (!) 107  Resp: 18 19  Temp: 97.8 F (36.6 C) 97.8 F (36.6 C)  SpO2: 99% 93%   Vitals:   06/07/20 1610 06/07/20 2140 06/08/20 0602 06/08/20 1320  BP: 131/83 130/71 129/73 124/74  Pulse: 90 99 83 (!) 107  Resp: 18 18 18 19   Temp: 98 F (36.7 C) 98.1 F (36.7 C) 97.8 F (36.6 C) 97.8 F (36.6 C)  TempSrc: Oral Oral Oral Oral  SpO2: 99% 99% 99% 93%  Weight:      Height:        General: Pt is alert, awake,  not in acute distress Cardiovascular: RRR, S1/S2 +, no rubs, no  gallops Respiratory: CTA bilaterally, no wheezing, no rhonchi Abdominal: Soft, NT, ND, bowel sounds + Extremities: no edema, no cyanosis    The results of significant diagnostics from this hospitalization (including imaging, microbiology, ancillary and laboratory) are listed below for reference.     Microbiology: No results found for this or any previous visit (from the past 240 hour(s)).   Labs: BNP (last 3 results) Recent Labs    05/24/20 1847 05/26/20 0505  BNP 198.6* 478.2*   Basic Metabolic Panel: No results for input(s): NA, K, CL, CO2, GLUCOSE, BUN, CREATININE, CALCIUM, MG, PHOS in the last 168 hours. Liver Function Tests: No results for input(s): AST, ALT, ALKPHOS, BILITOT, PROT, ALBUMIN in the last 168 hours. No results for input(s): LIPASE, AMYLASE in the last 168 hours. No results for input(s): AMMONIA in the last 168 hours. CBC: No results for input(s): WBC, NEUTROABS, HGB, HCT, MCV, PLT in the last 168 hours. Cardiac Enzymes: No results for input(s): CKTOTAL, CKMB, CKMBINDEX, TROPONINI in the last 168 hours. BNP: Invalid input(s): POCBNP CBG: No results for input(s): GLUCAP in the last 168 hours. D-Dimer No results for input(s): DDIMER in the last 72 hours. Hgb A1c No results for input(s): HGBA1C in the last 72 hours. Lipid Profile No results for input(s): CHOL, HDL, LDLCALC, TRIG, CHOLHDL, LDLDIRECT in the last 72 hours. Thyroid function studies No results for input(s): TSH, T4TOTAL, T3FREE, THYROIDAB in the last 72 hours.  Invalid input(s): FREET3 Anemia work up No results for input(s): VITAMINB12, FOLATE, FERRITIN, TIBC, IRON, RETICCTPCT in the last 72 hours. Urinalysis    Component Value Date/Time   COLORURINE YELLOW 06/04/2020 1624   APPEARANCEUR HAZY (A) 06/04/2020 1624   LABSPEC >1.046 (H) 06/04/2020 1624   PHURINE 5.0 06/04/2020 1624   GLUCOSEU NEGATIVE 06/04/2020 1624   HGBUR LARGE (A) 06/04/2020 1624   BILIRUBINUR NEGATIVE 06/04/2020 1624    KETONESUR NEGATIVE 06/04/2020 1624   PROTEINUR 30 (A) 06/04/2020 1624   UROBILINOGEN 0.2 10/04/2010 2351   NITRITE NEGATIVE 06/04/2020 1624   LEUKOCYTESUR TRACE (A) 06/04/2020 1624   Sepsis Labs Invalid input(s): PROCALCITONIN,  WBC,  LACTICIDVEN Microbiology No results found for this or any previous visit (from the past 240 hour(s)).   Time coordinating discharge: 31 minutes.  SIGNED:   Hosie Poisson, MD  [facility]

## 2020-06-17 ENCOUNTER — Telehealth: Payer: Self-pay

## 2020-06-17 NOTE — Telephone Encounter (Signed)
Spoke with patient and scheduled a in-person Palliative Consult for 07/08/20 @ 10AM   COVID screening was negative. 2 cats in the home. Patient lives with husband.   Consent obtained; updated Outlook/Netsmart/Team List and Epic.

## 2020-06-22 ENCOUNTER — Ambulatory Visit: Payer: BLUE CROSS/BLUE SHIELD

## 2020-06-22 ENCOUNTER — Other Ambulatory Visit: Payer: BLUE CROSS/BLUE SHIELD

## 2020-06-22 ENCOUNTER — Ambulatory Visit: Payer: BLUE CROSS/BLUE SHIELD | Admitting: Hematology and Oncology

## 2020-06-24 ENCOUNTER — Ambulatory Visit: Payer: BLUE CROSS/BLUE SHIELD

## 2020-06-24 ENCOUNTER — Ambulatory Visit: Payer: BLUE CROSS/BLUE SHIELD | Admitting: Cardiovascular Disease

## 2020-06-28 ENCOUNTER — Other Ambulatory Visit: Payer: BLUE CROSS/BLUE SHIELD

## 2020-06-28 ENCOUNTER — Encounter: Payer: BLUE CROSS/BLUE SHIELD | Admitting: Genetic Counselor

## 2020-07-08 ENCOUNTER — Other Ambulatory Visit: Payer: BLUE CROSS/BLUE SHIELD | Admitting: Internal Medicine

## 2020-07-08 ENCOUNTER — Other Ambulatory Visit: Payer: Self-pay

## 2020-07-21 ENCOUNTER — Telehealth: Payer: Self-pay

## 2020-07-21 NOTE — Telephone Encounter (Signed)
Patient states she is unable to come to today's port flush appt due to car trouble. Message sent to scheduling dept to reschedule when available.

## 2020-07-27 ENCOUNTER — Inpatient Hospital Stay: Payer: BLUE CROSS/BLUE SHIELD | Attending: Internal Medicine

## 2020-07-27 ENCOUNTER — Other Ambulatory Visit: Payer: Self-pay

## 2020-07-27 DIAGNOSIS — Z171 Estrogen receptor negative status [ER-]: Secondary | ICD-10-CM | POA: Diagnosis not present

## 2020-07-27 DIAGNOSIS — C50412 Malignant neoplasm of upper-outer quadrant of left female breast: Secondary | ICD-10-CM | POA: Diagnosis not present

## 2020-07-27 DIAGNOSIS — Z452 Encounter for adjustment and management of vascular access device: Secondary | ICD-10-CM | POA: Diagnosis not present

## 2020-07-27 DIAGNOSIS — Z95828 Presence of other vascular implants and grafts: Secondary | ICD-10-CM

## 2020-07-27 MED ORDER — HEPARIN SOD (PORK) LOCK FLUSH 100 UNIT/ML IV SOLN
500.0000 [IU] | Freq: Once | INTRAVENOUS | Status: AC
  Administered 2020-07-27: 500 [IU]
  Filled 2020-07-27: qty 5

## 2020-07-27 MED ORDER — SODIUM CHLORIDE 0.9% FLUSH
10.0000 mL | Freq: Once | INTRAVENOUS | Status: AC
  Administered 2020-07-27: 10 mL
  Filled 2020-07-27: qty 10

## 2020-07-27 MED ORDER — SODIUM CHLORIDE 0.9% FLUSH
10.0000 mL | Freq: Once | INTRAVENOUS | Status: DC
  Filled 2020-07-27: qty 10

## 2020-08-03 ENCOUNTER — Encounter: Payer: Self-pay | Admitting: Internal Medicine

## 2020-08-12 ENCOUNTER — Ambulatory Visit: Payer: BLUE CROSS/BLUE SHIELD | Admitting: Cardiovascular Disease

## 2020-08-17 ENCOUNTER — Other Ambulatory Visit: Payer: Self-pay | Admitting: Cardiovascular Disease

## 2020-08-17 NOTE — Telephone Encounter (Signed)
LVM for patient to call and schedule

## 2020-08-17 NOTE — Telephone Encounter (Signed)
Pt overdue for 6 month f/u.  Please contact pt for future appointment. 

## 2020-08-19 NOTE — Telephone Encounter (Signed)
Scheduled for 2/7

## 2020-08-23 ENCOUNTER — Ambulatory Visit: Payer: BLUE CROSS/BLUE SHIELD | Admitting: Family

## 2020-08-23 NOTE — Progress Notes (Deleted)
Office Visit    Patient Name: Deborah [name] Date of Encounter: 08/23/2020  Primary Care Provider:  Pleas Koch, NP Primary Cardiologist:  Kathlyn Sacramento, MD Electrophysiologist:  None   Chief Complaint    Deborah [name] is a 65 y.o. female with a hx of chronic diastolic heart failure and chronic right heart failure due to COPD, previous tobacco use, hypertension, obesity, bipolar disorder, sinus tachycardia presents today for follow up of chronic diastolic heart failure.   Past Medical History    Past Medical History:  Diagnosis Date  . Anemia    as teen  . Asthma   . Breast cancer ([place])    left breast cancer 02/2020  . CHF (congestive heart failure) ([place])   . COPD, mild ([place])   . Depression   . Diverticulitis   . Family history of anesthesia complication    vomiting  . GI bleeding   . Heart failure ([place])    New onset 07/25/14  . Histoplasmosis    left eye  . Hyperkalemia   . Hypertension   . Lung cancer ([place]) 05/02/2018   s/p chemoradiation, immunotherapy  . Obesity (BMI 30-39.9)   . Pneumonia    dx wtih pneumonia on 05/27/16- seen by [facility]   . PONV (postoperative nausea and vomiting)   . Restrictive lung disease   . Shortness of breath    with exertion   . Sleep apnea    mask and oxygen at nite for sleep at 2L   . Tobacco abuse   . Umbilical hernia    Past Surgical History:  Procedure Laterality Date  . APPLICATION OF WOUND VAC  03/19/2013   Procedure: APPLICATION OF WOUND VAC;  Surgeon: Madilyn Hook, DO;  [facility];  Service: General;;  . BREAST LUMPECTOMY WITH RADIOACTIVE SEED AND SENTINEL LYMPH NODE BIOPSY Left 04/08/2020   Procedure: LEFT BREAST LUMPECTOMY WITH RADIOACTIVE SEED AND LEFT AXILLARY SENTINEL LYMPH NODE BIOPSY;  Surgeon: Alphonsa Overall, MD;  Location: [facility];  Service: General;  Laterality: Left;  PEC BLOCK, NEEDS ANESTHESIA CONSULT  . CESAREAN SECTION  04/12/85  . COLONOSCOPY WITH PROPOFOL N/A 06/13/2016   Procedure:  COLONOSCOPY WITH PROPOFOL;  Surgeon: Jerene Bears, MD;  [facility];  Service: Gastroenterology;  Laterality: N/A;  . ERCP N/A 02/15/2017   Procedure: ENDOSCOPIC RETROGRADE CHOLANGIOPANCREATOGRAPHY (ERCP);  Surgeon: [name], MD;  Location: [facility];  Service: Endoscopy;  Laterality: N/A;  . INSERTION OF MESH N/A 03/19/2013   Procedure: INSERTION OF MESH;  Surgeon: Madilyn Hook, DO;  [facility];  Service: General;  Laterality: N/A;  . IR IMAGING GUIDED PORT INSERTION  03/26/2020  . IR THORACENTESIS ASP PLEURAL SPACE W/IMG GUIDE  05/25/2020  . VENTRAL HERNIA REPAIR N/A 03/19/2013   Procedure:  OPEN VENTRAL HERNIA REPAIR WITH MESH AND APPLICATION OF WOUND VAC;  Surgeon: Madilyn Hook, DO;  [facility];  Service: General;  Laterality: N/A;  . VIDEO BRONCHOSCOPY Bilateral 04/19/2018   Procedure: VIDEO BRONCHOSCOPY WITH FLUORO;  Surgeon: Rigoberto Noel, MD;  [facility];  Service: Cardiopulmonary;  Laterality: Bilateral;    Allergies  No Known Allergies  History of Present Illness    Deborah [name] is a 65 y.o. female with a hx of chronic diastolic heart failure and chronic right heart failure due to COPD, previous tobacco use, hypertension, obesity, bipolar disorder, sinus tachycardia . She was last seen by Dr. Fletcher Anon 12/16/19.  Hospitalized January 2016 for heart  failure. She had moderate sized left pleural effusion. Echo with LVEF 50-55%, gr1DD, dilated RV with hypokinesis and mild pulmonary hypertension.   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   Echocardiogram 01/27/20 with LVEF >55%, moderate LVH.  In September 2021 she was diagnosed with breast cancer which is being treated with Adriamycin and Cytoxan with plan to follow with radiation. However, she was only able to tolerate 2 doses of the chemotherapy and declined radiation due to concern for damage to  her lungs. She was hospitalized 05/24/20 due to pleural effusion. Underwent thoracentesis and given antibiotics. Of note, she had not been taking Lasix at home due to not wanting to use the restroom frequently. Echo during admission with LVEF 55-60%, no significant valvular abnormalities. She was readmitted 06/04/20 due to GI bleed from a diverticular bleed requiring 4 units PRBC. Palliative medicine has been consulted.   ***  EKGs/Labs/Other Studies Reviewed:   The following studies were reviewed today:  Echo 05/25/20 1. Left ventricular ejection fraction, by estimation, is 55 to 60%. The  left ventricle has normal function. The left ventricle has no regional  wall motion abnormalities. Indeterminate diastolic filling due to E-A  fusion.   2. Right ventricular systolic function is normal. The right ventricular  size is normal. There is normal pulmonary artery systolic pressure. The  estimated right ventricular systolic pressure is 81.8 mmHg.   3. The mitral valve is normal in structure. No evidence of mitral valve  regurgitation. No evidence of mitral stenosis.   4. The aortic valve is normal in structure. Aortic valve regurgitation is  not visualized. No aortic stenosis is present.   5. The inferior vena cava is normal in size with greater than 50%  respiratory variability, suggesting right atrial pressure of 3 mmHg.   Echo 01/2020 1. Left ventricular ejection fraction, by estimation, is >55%. The left  ventricle has normal function. Left ventricular endocardial border not  optimally defined to evaluate regional wall motion. There is moderate left  ventricular hypertrophy.  Indeterminate diastolic filling due to E-A fusion.   2. Right ventricular systolic function is normal. The right ventricular  size is not well visualized. Mildly increased right ventricular wall  thickness. Tricuspid regurgitation signal is inadequate for assessing PA  pressure.   3. The mitral valve was not well  visualized. No evidence of mitral valve  regurgitation.   4. The aortic valve was not well visualized. Aortic valve regurgitation  is not visualized. No aortic stenosis is present.   EKG:  EKG is ordered today.  The ekg ordered today demonstrates ***  Recent Labs: 10/27/2019: TSH 2.929 05/26/2020: B Natriuretic Peptide 280.0 05/31/2020: Magnesium 2.3 06/05/2020: ALT 36 06/07/2020: Platelets 142 06/08/2020: BUN <5; Creatinine, Ser 0.35; Hemoglobin 10.6; Potassium 3.7; Sodium 140  Recent Lipid Panel    Component Value Date/Time   CHOL 200 03/29/2015 1216   TRIG 98.0 03/29/2015 1216   HDL 44.60 03/29/2015 1216   CHOLHDL 4 03/29/2015 1216   VLDL 19.6 03/29/2015 1216   LDLCALC 136 (H) 03/29/2015 1216    Home Medications   No outpatient medications have been marked as taking for the 08/23/20 encounter (Appointment) with Loel Dubonnet, NP.     Review of Systems   ***   ROS All other systems reviewed and are otherwise negative except as noted above.  Physical Exam    VS:  There were no vitals taken for  this visit. , BMI There is no height or weight on file to calculate BMI.  Wt Readings from Last 3 Encounters:  06/04/20 229 lb 4.5 oz (104 kg)  06/02/20 226 lb 10.1 oz (102.8 kg)  05/11/20 238 lb 1.6 oz (108 kg)    GEN: Well nourished, well developed, in no acute distress. HEENT: normal. Neck: Supple, no JVD, carotid bruits, or masses. Cardiac: ***RRR, no murmurs, rubs, or gallops. No clubbing, cyanosis, edema.  ***Radials/DP/PT 2+ and equal bilaterally.  Respiratory:  ***Respirations regular and unlabored, clear to auscultation bilaterally. GI: Soft, nontender, nondistended. MS: No deformity or atrophy. Skin: Warm and dry, no rash. Neuro:  Strength and sensation are intact. Psych: Normal affect.  Assessment & Plan    1. Chronic diastolic heart failure -   2. Hypertension -   3. COPD -   4. Stage IIIb lung cancer s/p radiation and chemotherapy -   Disposition:  Follow up {follow up:15908} with Dr. Fletcher Anon or APP   Signed, Loel Dubonnet, NP 08/23/2020, 8:43 AM [place]

## 2020-08-26 NOTE — Progress Notes (Signed)
Cardiology Office Note:    Date:  08/26/2020   ID:  Deborah [name], DOB [birth year], MRN [id number]  PCP:  Pleas Koch, NP  [facility] Cardiologist:  Kathlyn Sacramento, MD  [facility] Electrophysiologist:  None   Referring MD: Pleas Koch, NP   Chief Complaint: 6 month follow-up  History of Present Illness:    Deborah [name] is a 65 y.o. female with a hx of chronic diastolic heart failure, chronic right heart failure due to COPD, previous tobacco use, lung cancer, hypertension, obesity, bipolar disorder who presents for follow-up.  The patient was hospitalized in January 2016 for CHF and was found to have moderate sized pleural effusion. Echo at that time showed LVEF 50-55%, G1DD, dialted RV and hypokinesis and mild pulmonary hypertension. VQ scan was low for PE. She quit smoking after this. In 2019 the patient was diagnosed with squamous cell carcinoma of the lung. She had large left lobe mass and mediastinal lymphadenopathy. She was treated with radiation, chemo, and immunotherapy. She is on O2 at home.   The patient was last seen 12/16/19 and was doing well. She has chronic exertional dyspnea on O2. Also was in sinus tachycardia, which appeared to be normal after reviewing EKGs. An echo was ordered which showed LVEF >55%, mod LVH, no regional WMA.   Today, the patient reports that she was diagnosed with breast cancer by chest CT July 2021 and had a lumpectomy in October 2021. Since then she had one treatment on chemotherapy but did not tolerate this. Since then she reports generalized weakness. Today she is here in a wheelchair, but is normally ambulating without a cane or walker. She denies chest pain. She has chronic shortness of breath that is unchanged. Denies lower leg edema, orthopnea, pnd. She is on 4LO2 at all times. No recent fever or chills. Heart rate is 122bpm which, as stated above, seems to be normal for the patient. When the patient sits for a while it will  go down to mid 80s and low 90s. She does feel her heart racing and feels it makes her more tired. No dizziness, lightheadedness. She is seeing pulmonary every 6 months. Patients appetite has been low for the last couple months and has lost about 60lbs. She did cut out some fatty foods on her own to lose weight. She feels appetite is slowly getting better. She is drinking normally.    Past Medical History:  Diagnosis Date  . Anemia    as teen  . Asthma   . Breast cancer ([place])    left breast cancer 02/2020  . CHF (congestive heart failure) ([place])   . COPD, mild ([place])   . Depression   . Diverticulitis   . Family history of anesthesia complication    vomiting  . GI bleeding   . Heart failure ([place])    New onset 07/25/14  . Histoplasmosis    left eye  . Hyperkalemia   . Hypertension   . Lung cancer ([place]) 05/02/2018   s/p chemoradiation, immunotherapy  . Obesity (BMI 30-39.9)   . Pneumonia    dx wtih pneumonia on 05/27/16- seen by [facility]   . PONV (postoperative nausea and vomiting)   . Restrictive lung disease   . Shortness of breath    with exertion   . Sleep apnea    mask and oxygen at nite for sleep at 2L   . Tobacco abuse   . Umbilical hernia     Past  Surgical History:  Procedure Laterality Date  . APPLICATION OF WOUND VAC  03/19/2013   Procedure: APPLICATION OF WOUND VAC;  Surgeon: Madilyn Hook, DO;  [facility];  Service: General;;  . BREAST LUMPECTOMY WITH RADIOACTIVE SEED AND SENTINEL LYMPH NODE BIOPSY Left 04/08/2020   Procedure: LEFT BREAST LUMPECTOMY WITH RADIOACTIVE SEED AND LEFT AXILLARY SENTINEL LYMPH NODE BIOPSY;  Surgeon: Alphonsa Overall, MD;  Location: [facility];  Service: General;  Laterality: Left;  PEC BLOCK, NEEDS ANESTHESIA CONSULT  . CESAREAN SECTION  04/12/85  . COLONOSCOPY WITH PROPOFOL N/A 06/13/2016   Procedure: COLONOSCOPY WITH PROPOFOL;  Surgeon: Jerene Bears, MD;  [facility];  Service: Gastroenterology;  Laterality: N/A;  . ERCP N/A 02/15/2017    Procedure: ENDOSCOPIC RETROGRADE CHOLANGIOPANCREATOGRAPHY (ERCP);  Surgeon: [name], MD;  Location: [facility];  Service: Endoscopy;  Laterality: N/A;  . INSERTION OF MESH N/A 03/19/2013   Procedure: INSERTION OF MESH;  Surgeon: Madilyn Hook, DO;  [facility];  Service: General;  Laterality: N/A;  . IR IMAGING GUIDED PORT INSERTION  03/26/2020  . IR THORACENTESIS ASP PLEURAL SPACE W/IMG GUIDE  05/25/2020  . VENTRAL HERNIA REPAIR N/A 03/19/2013   Procedure:  OPEN VENTRAL HERNIA REPAIR WITH MESH AND APPLICATION OF WOUND VAC;  Surgeon: Madilyn Hook, DO;  [facility];  Service: General;  Laterality: N/A;  . VIDEO BRONCHOSCOPY Bilateral 04/19/2018   Procedure: VIDEO BRONCHOSCOPY WITH FLUORO;  Surgeon: Rigoberto Noel, MD;  [facility];  Service: Cardiopulmonary;  Laterality: Bilateral;    Current Medications: No outpatient medications have been marked as taking for the 08/27/20 encounter (Appointment) with Loel Dubonnet, NP.     Allergies:   Patient has no known allergies.   Social History   Socioeconomic History  . Marital status: Married    Spouse name: Not on file  . Number of children: 1  . Years of education: Not on file  . Highest education level: Not on file  Occupational History  . Occupation: Retired    Fish farm manager: NOT EMPLOYED  Tobacco Use  . Smoking status: Former Smoker    Packs/day: 3.00    Years: 43.00    Pack years: 129.00    Types: Cigarettes    Quit date: 12/17/2012    Years since quitting: 7.6  . Smokeless tobacco: Never Used  Vaping Use  . Vaping Use: Former  Substance and Sexual Activity  . Alcohol use: No    Alcohol/week: 0.0 standard drinks  . Drug use: No  . Sexual activity: Not on file  Other Topics Concern  . Not on file  Social History Narrative   ** Merged History Encounter **       ** Data from: 07/25/14 Enc Dept: [facility]       ** Data from: 06/05/13 Enc Dept: [facility]   Married, one son 15 yo.             Social Determinants of Health   Financial Resource Strain: Not on file  Food Insecurity: Not on file  Transportation Needs: Not on file  Physical Activity: Not on file  Stress: Not on file  Social Connections: Not on file     Family History: The patient's *family history includes Bipolar disorder in her father and sister; Congestive Heart Failure in her father; Emphysema in her mother; Heart attack in her maternal uncle, maternal uncle, and mother; Lymphoma in her maternal uncle; Melanoma in her sister; Other in her sister; Pancreatic cancer  in her maternal aunt; Schizophrenia in her sister. There is no history of Colon cancer or Breast cancer.  ROS:   Please see the history of present illness.    All other systems reviewed and are negative.  EKGs/Labs/Other Studies Reviewed:    The following studies were reviewed today:  Echo 05/25/20 1. Left ventricular ejection fraction, by estimation, is 55 to 60%. The  left ventricle has normal function. The left ventricle has no regional  wall motion abnormalities. Indeterminate diastolic filling due to E-A  fusion.  2. Right ventricular systolic function is normal. The right ventricular  size is normal. There is normal pulmonary artery systolic pressure. The  estimated right ventricular systolic pressure is 25.9 mmHg.  3. The mitral valve is normal in structure. No evidence of mitral valve  regurgitation. No evidence of mitral stenosis.  4. The aortic valve is normal in structure. Aortic valve regurgitation is  not visualized. No aortic stenosis is present.  5. The inferior vena cava is normal in size with greater than 50%  respiratory variability, suggesting right atrial pressure of 3 mmHg.   Comparison(s): No significant change from prior study. Prior images  reviewed side by side.    Echo 01/2020 1. Left ventricular ejection fraction, by estimation, is >55%. The left  ventricle has normal function. Left ventricular  endocardial border not  optimally defined to evaluate regional wall motion. There is moderate left  ventricular hypertrophy.  Indeterminate diastolic filling due to E-A fusion.  2. Right ventricular systolic function is normal. The right ventricular  size is not well visualized. Mildly increased right ventricular wall  thickness. Tricuspid regurgitation signal is inadequate for assessing PA  pressure.  3. The mitral valve was not well visualized. No evidence of mitral valve  regurgitation.  4. The aortic valve was not well visualized. Aortic valve regurgitation  is not visualized. No aortic stenosis is present.   EKG:  EKG is ordered today.  The ekg ordered today demonstrates Sinus tach, 122bpm, PVCs, low voltage, nonspecific T wave abnormality  Recent Labs: 10/27/2019: TSH 2.929 05/26/2020: B Natriuretic Peptide 280.0 05/31/2020: Magnesium 2.3 06/05/2020: ALT 36 06/07/2020: Platelets 142 06/08/2020: BUN <5; Creatinine, Ser 0.35; Hemoglobin 10.6; Potassium 3.7; Sodium 140  Recent Lipid Panel    Component Value Date/Time   CHOL 200 03/29/2015 1216   TRIG 98.0 03/29/2015 1216   HDL 44.60 03/29/2015 1216   CHOLHDL 4 03/29/2015 1216   VLDL 19.6 03/29/2015 1216   LDLCALC 136 (H) 03/29/2015 1216     Risk Assessment/Calculations:       Physical Exam:    VS:  There were no vitals taken for this visit.    Wt Readings from Last 3 Encounters:  06/04/20 229 lb 4.5 oz (104 kg)  06/02/20 226 lb 10.1 oz (102.8 kg)  05/11/20 238 lb 1.6 oz (108 kg)     GEN: WF in no acute distress HEENT: Normal NECK: No JVD; No carotid bruits LYMPHATICS: No lymphadenopathy CARDIAC: RRR, no murmurs, rubs, gallops RESPIRATORY:  +wheezing ABDOMEN: Soft, non-tender, non-distended MUSCULOSKELETAL:  No edema; No deformity  SKIN: Warm and dry NEUROLOGIC:  Alert and oriented x 3 PSYCHIATRIC:  Normal affect   ASSESSMENT:    1. Chronic diastolic heart failure ([place])   2. Essential hypertension    3. Hypertension, unspecified type    PLAN:    In order of problems listed above:  Chronic diastolic heart failure Chronic right heart failure due to COPD Most recent echo 05/2020 showed LVEF  55-60%. She has lost weight over the last few months, some is intentional, but some is due to low appetite. This seems to be slowly improving. She is tachycardic but this is also common after any exertion. Heart rates improve to the 80-90s after sitting. She is euvolemic on exam today. Continue lasix 40mg  daily and potassium supplement. BP unable to tolerate other GDMT. Patient also following with pulmonology. Recent labs were unremarkable.  HTN BP soft today and she has not had diltiazem today. In review it looks like BP is normally soft. She denies dizziness or lightheadedness. Instructed if she were to develop symptoms call in and we can decrease diltiazem dose. Continue current dose.   Hx of Lung cancer COPD on chronic O2 She remains on 4L O2 at all times. She denies worsening SOB. Mild wheezing on exam. She follows with pulmonology every 6 months.   Breast cancer New dx since the last visit. She underwent L lumpectomy in October 2021. She did not tolerate first dose of chemotherapy. She is following with oncology.   Disposition: Follow up in 6 month(s) with MD    Signed, Johnathen Testa Ninfa Meeker, PA-C  08/26/2020 2:51 PM    [facility]

## 2020-08-27 ENCOUNTER — Encounter: Payer: Self-pay | Admitting: Medical

## 2020-08-27 ENCOUNTER — Ambulatory Visit: Payer: BLUE CROSS/BLUE SHIELD | Admitting: Medical

## 2020-08-27 ENCOUNTER — Other Ambulatory Visit: Payer: Self-pay

## 2020-08-27 VITALS — BP 104/68 | HR 122 | Ht 62.0 in | Wt 202.0 lb

## 2020-08-27 DIAGNOSIS — I10 Essential (primary) hypertension: Secondary | ICD-10-CM | POA: Diagnosis not present

## 2020-08-27 DIAGNOSIS — I5032 Chronic diastolic (congestive) heart failure: Secondary | ICD-10-CM

## 2020-08-27 NOTE — Patient Instructions (Signed)
Medication Instructions:  Continue your current medications.   *If you need a refill on your cardiac medications before your next appointment, please call your pharmacy*   Lab Work: None ordered today.   Testing/Procedures: Your EKG today shows sinus tachycardia which is a regular but fast heart rate and stable compared to your previous EKGs.    Follow-Up: At [facility], you and your health needs are our priority.  As part of our continuing mission to provide you with exceptional heart care, we have created designated Provider Care Teams.  These Care Teams include your primary Cardiologist (physician) and Advanced Practice Providers (APPs -  Physician Assistants and Nurse Practitioners) who all work together to provide you with the care you need, when you need it.  We recommend signing up for the patient portal called "MyChart".  Sign up information is provided on this After Visit Summary.  MyChart is used to connect with patients for Virtual Visits (Telemedicine).  Patients are able to view lab/test results, encounter notes, upcoming appointments, etc.  Non-urgent messages can be sent to your provider as well.   To learn more about what you can do with MyChart, go to [email or web address].    Your next appointment:   6 month(s)  The format for your next appointment:   In Person  Provider:   You may see Kathlyn Sacramento, MD or one of the following Advanced Practice Providers on your designated Care Team:    Murray Hodgkins, NP  Christell Faith, PA-C  Marrianne Mood, PA-C  Cadence Kathlen Mody, [place]  Laurann [place], NP  Other Instructions  Please contact our office if you notice that you are getting lightheaded or dizzy.

## 2020-09-10 ENCOUNTER — Other Ambulatory Visit: Payer: Self-pay | Admitting: Cardiovascular Disease

## 2020-09-10 DIAGNOSIS — H01111 Allergic dermatitis of right upper eyelid: Secondary | ICD-10-CM | POA: Diagnosis not present

## 2020-09-10 DIAGNOSIS — H1033 Unspecified acute conjunctivitis, bilateral: Secondary | ICD-10-CM | POA: Diagnosis not present

## 2020-09-10 DIAGNOSIS — H01112 Allergic dermatitis of right lower eyelid: Secondary | ICD-10-CM | POA: Diagnosis not present

## 2020-09-10 DIAGNOSIS — H01114 Allergic dermatitis of left upper eyelid: Secondary | ICD-10-CM | POA: Diagnosis not present

## 2020-09-15 NOTE — Progress Notes (Signed)
Patient Care Team: Pleas Koch, NP as PCP - General (Internal Medicine) [name], MD as PCP - Cardiology (Cardiology) [name], MD (Inactive) (Family Medicine) [name], MD as Consulting Physician (Cardiology) Rockwell Germany, RN as Oncology Nurse Navigator Mauro Kaufmann, RN as Oncology Nurse Navigator  DIAGNOSIS:    ICD-10-CM   1. Primary malignant neoplasm of bronchus of left lower lobe (HCC)  C34.32   2. Malignant neoplasm of upper-outer quadrant of left breast in female, estrogen receptor negative ([place])  C50.412    Z17.1     SUMMARY OF ONCOLOGIC HISTORY: Oncology History  Primary malignant neoplasm of bronchus of left lower lobe ([place])  05/02/2018 Initial Diagnosis   Stage III squamous cell carcinoma of left lung ([place])   05/13/2018 - 06/24/2018 Chemotherapy   The patient had palonosetron (ALOXI) injection 0.25 mg, 0.25 mg, Intravenous,  Once, 7 of 7 cycles Administration: 0.25 mg (05/13/2018), 0.25 mg (06/10/2018), 0.25 mg (06/17/2018), 0.25 mg (05/20/2018), 0.25 mg (06/24/2018), 0.25 mg (05/27/2018), 0.25 mg (06/03/2018) CARBOplatin (PARAPLATIN) 300 mg in sodium chloride 0.9 % 250 mL chemo infusion, 300 mg (100 % of original dose 300 mg), Intravenous,  Once, 7 of 7 cycles Dose modification: 300 mg (original dose 300 mg, Cycle 1) Administration: 300 mg (05/13/2018), 300 mg (06/10/2018), 260 mg (06/17/2018), 300 mg (05/20/2018), 300 mg (06/24/2018), 300 mg (05/27/2018), 300 mg (06/03/2018) PACLitaxel (TAXOL) 96 mg in sodium chloride 0.9 % 250 mL chemo infusion (</= 77m/m2), 45 mg/m2 = 96 mg, Intravenous,  Once, 7 of 7 cycles Administration: 96 mg (05/13/2018), 96 mg (06/10/2018), 96 mg (06/17/2018), 96 mg (05/20/2018), 96 mg (06/24/2018), 96 mg (05/27/2018), 96 mg (06/03/2018)  for chemotherapy treatment.    07/31/2018 - 07/15/2019 Chemotherapy   The patient had durvalumab (IMFINZI) 1,000 mg in sodium chloride 0.9 % 100 mL chemo infusion, 1,020 mg,  Intravenous,  Once, 26 of 26 cycles Administration: 1,000 mg (07/31/2018), 1,000 mg (10/22/2018), 1,000 mg (11/05/2018), 1,000 mg (11/19/2018), 1,000 mg (12/03/2018), 1,000 mg (08/14/2018), 1,000 mg (12/17/2018), 1,000 mg (08/27/2018), 1,000 mg (09/10/2018), 1,000 mg (09/24/2018), 1,000 mg (10/11/2018), 1,000 mg (12/31/2018), 1,000 mg (01/14/2019), 1,120 mg (01/28/2019), 1,120 mg (02/11/2019), 1,120 mg (02/25/2019), 1,120 mg (03/11/2019), 1,120 mg (03/25/2019), 1,120 mg (04/08/2019), 1,120 mg (04/22/2019), 1,120 mg (05/06/2019), 1,120 mg (05/20/2019), 1,120 mg (06/03/2019), 1,120 mg (06/17/2019), 1,120 mg (07/01/2019), 1,120 mg (07/15/2019)  for chemotherapy treatment.    05/11/2020 - 05/13/2020 Chemotherapy   The patient had DOXOrubicin (ADRIAMYCIN) chemo injection 88 mg, 40 mg/m2 = 88 mg (66.7 % of original dose 60 mg/m2), Intravenous,  Once, 1 of 4 cycles Dose modification: 50 mg/m2 (original dose 60 mg/m2, Cycle 1, Reason: Provider Judgment), 40 mg/m2 (original dose 60 mg/m2, Cycle 1, Reason: Provider Judgment) Administration: 88 mg (05/11/2020) palonosetron (ALOXI) injection 0.25 mg, 0.25 mg, Intravenous,  Once, 1 of 4 cycles Administration: 0.25 mg (05/11/2020) pegfilgrastim-cbqv (UDENYCA) injection 6 mg, 6 mg, Subcutaneous, Once, 1 of 4 cycles Administration: 6 mg (05/13/2020) cyclophosphamide (CYTOXAN) 880 mg in sodium chloride 0.9 % 250 mL chemo infusion, 400 mg/m2 = 880 mg (66.7 % of original dose 600 mg/m2), Intravenous,  Once, 1 of 4 cycles Dose modification: 400 mg/m2 (original dose 600 mg/m2, Cycle 1, Reason: Provider Judgment) Administration: 880 mg (05/11/2020) PACLitaxel (TAXOL) 174 mg in sodium chloride 0.9 % 250 mL chemo infusion (</= 843mm2), 80 mg/m2 = 174 mg, Intravenous,  Once, 0 of 12 cycles fosaprepitant (EMEND) 150 mg in sodium chloride 0.9 % 145 mL  IVPB, 150 mg, Intravenous,  Once, 1 of 4 cycles Administration: 150 mg (05/11/2020)  for chemotherapy treatment.    Malignant neoplasm of  upper-outer quadrant of left breast in female, estrogen receptor negative ([place])  02/19/2020 Cancer Staging   Staging form: Breast, AJCC 8th Edition - Clinical stage from 02/19/2020: Stage IIB (cT2, cN0, cM0, G3, ER-, PR-, HER2-) - Signed by Gardenia Phlegm, NP on 02/25/2020   02/19/2020 Initial Diagnosis   Chest CT for lung cancer follow-up showed a left breast module. Mammogram and US showed a 3.7cm mass with calcifications at the 2 o'clock position in the left breast, no left axillary adenopathy. Biopsy showed IDC, grade 3, HER-2 negative (0), ER/PR negative, Ki67 40%.   04/08/2020 Surgery   Left lumpectomy [name]): three foci of IDC, grade 3, 3.3cm, 1.0cm, 0.7cm, with high grade DCIS, clear margins, 2 left axillary lymph nodes negative for carcinoma.    05/11/2020 - 05/13/2020 Chemotherapy   The patient had DOXOrubicin (ADRIAMYCIN) chemo injection 88 mg, 40 mg/m2 = 88 mg (66.7 % of original dose 60 mg/m2), Intravenous,  Once, 1 of 4 cycles Dose modification: 50 mg/m2 (original dose 60 mg/m2, Cycle 1, Reason: Provider Judgment), 40 mg/m2 (original dose 60 mg/m2, Cycle 1, Reason: Provider Judgment) Administration: 88 mg (05/11/2020) palonosetron (ALOXI) injection 0.25 mg, 0.25 mg, Intravenous,  Once, 1 of 4 cycles Administration: 0.25 mg (05/11/2020) pegfilgrastim-cbqv (UDENYCA) injection 6 mg, 6 mg, Subcutaneous, Once, 1 of 4 cycles Administration: 6 mg (05/13/2020) cyclophosphamide (CYTOXAN) 880 mg in sodium chloride 0.9 % 250 mL chemo infusion, 400 mg/m2 = 880 mg (66.7 % of original dose 600 mg/m2), Intravenous,  Once, 1 of 4 cycles Dose modification: 400 mg/m2 (original dose 600 mg/m2, Cycle 1, Reason: Provider Judgment) Administration: 880 mg (05/11/2020) PACLitaxel (TAXOL) 174 mg in sodium chloride 0.9 % 250 mL chemo infusion (</= 26m/m2), 80 mg/m2 = 174 mg, Intravenous,  Once, 0 of 12 cycles fosaprepitant (EMEND) 150 mg in sodium chloride 0.9 % 145 mL IVPB, 150 mg, Intravenous,   Once, 1 of 4 cycles Administration: 150 mg (05/11/2020)  for chemotherapy treatment.      CHIEF COMPLIANT: Follow-up to discuss treatment plan  INTERVAL HISTORY: Deborah CREEKMOREis a 65y.o. with above-mentioned history of triple negative left breastwhounderwent a left lumpectomyand received one cycle of adjuvant chemotherapy with dose dense Adriamycin and Cytoxan.She was admitted from 05/24/20-06/02/20 after presenting to the ED for worsening hypoxia and was found to have a left pleural effusion. She was again admitted from 06/04/20-06/08/20 after presenting to the ED for a recurrent GI bleed and received 4 units of PRBCs. She presents to the clinic todayto discuss her treatment plan.    She continues to suffer from severe COPD and shortness of breath and uses wheelchair and 24-hour oxygen.  ALLERGIES:  has No Known Allergies.  MEDICATIONS:  Current Outpatient Medications  Medication Sig Dispense Refill  . acetaminophen (TYLENOL) 325 MG tablet Take 650 mg by mouth every 6 (six) hours as needed for headache (pain).    .Marland Kitchenalbuterol (VENTOLIN HFA) 108 (90 Base) MCG/ACT inhaler Inhale 2 puffs into the lungs every 6 (six) hours as needed for wheezing or shortness of breath. 8 g 3  . aspirin 81 MG chewable tablet Chew 1 tablet (81 mg total) by mouth daily. 30 tablet 1  . Aspirin-Caffeine (BC FAST PAIN RELIEF ARTHRITIS PO) Take 1 packet by mouth daily as needed (back pain).    . Budeson-Glycopyrrol-Formoterol (BREZTRI AEROSPHERE) 160-9-4.8 MCG/ACT AERO Inhale  2 puffs into the lungs daily. (Patient taking differently: Inhale 2 puffs into the lungs daily as needed (shortness of breath/wheezing).) 5.9 g 0  . diltiazem (CARDIZEM CD) 240 MG 24 hr capsule TAKE 1 CAPSULE BY MOUTH EVERY DAY 30 capsule 6  . furosemide (LASIX) 20 MG tablet TAKE 2 TABLETS BY MOUTH EVERY DAY (Patient taking differently: Take 20 mg by mouth daily.) 180 tablet 1  . ipratropium-albuterol (DUONEB) 0.5-2.5 (3) MG/3ML SOLN Take  3 mLs by nebulization every 3 (three) hours as needed. (Patient taking differently: Take 3 mLs by nebulization every 3 (three) hours as needed (shortness of breath/wheezing).) 360 mL 11  . montelukast (SINGULAIR) 10 MG tablet Take 1 tablet (10 mg total) by mouth at bedtime. 90 tablet 2  . OXYGEN Inhale 4 L/min into the lungs continuous.     . potassium chloride SA (KLOR-CON M20) 20 MEQ tablet TAKE 1/2 TABLET BY MOUTH EVERY DAY (Patient taking differently: Take 10 mEq by mouth daily.) 45 tablet 1  . Spacer/Aero-Holding Chambers (AEROCHAMBER MV) inhaler Use as instructed 1 each 0   No current facility-administered medications for this visit.    PHYSICAL EXAMINATION: ECOG PERFORMANCE STATUS: 2 - Symptomatic, <50% confined to bed  Vitals:   09/16/20 1508  BP: 110/77  Pulse: (!) 101  Resp: 17  Temp: 97.7 F (36.5 C)  SpO2: 100%   Filed Weights   09/16/20 1508  Weight: 197 lb 14.4 oz (89.8 kg)     LABORATORY DATA:  I have reviewed the data as listed CMP Latest Ref Rng & Units 06/08/2020 06/08/2020 06/07/2020  Glucose 70 - 99 mg/dL 103(H) - 100(H)  BUN 8 - 23 mg/dL <5(L) - 6(L)  Creatinine 0.44 - 1.00 mg/dL 0.35(L) 0.57 0.56  Sodium 135 - 145 mmol/L 140 - 141  Potassium 3.5 - 5.1 mmol/L 3.7 - 3.3(L)  Chloride 98 - 111 mmol/L 100 - 103  CO2 22 - 32 mmol/L 31 - 30  Calcium 8.9 - 10.3 mg/dL 7.7(L) - 7.4(L)  Total Protein 6.5 - 8.1 g/dL - - -  Total Bilirubin 0.3 - 1.2 mg/dL - - -  Alkaline Phos 38 - 126 U/L - - -  AST 15 - 41 U/L - - -  ALT 0 - 44 U/L - - -    Lab Results  Component Value Date   WBC 10.4 09/16/2020   HGB 12.2 09/16/2020   HCT 38.9 09/16/2020   MCV 87.8 09/16/2020   PLT 315 09/16/2020   NEUTROABS 8.4 (H) 09/16/2020    ASSESSMENT & PLAN:  Malignant neoplasm of upper-outer quadrant of left breast in female, estrogen receptor negative ([place]) 04/08/20:Left lumpectomy [name]): three foci of IDC, grade 3, 3.3cm, 1.0cm, 0.7cm, with high grade DCIS, clear  margins, 2 left axillary lymph nodes negative for carcinoma. HER-2 negative (0), ER/PR negative, Ki67 40%.   Treatment Plan: 1.Adjuvant chemotherapy with Adriamycin and Cytoxan dose dense 4 followed by Taxol weekly 12 (discontinued after cycle 1 because of profound fatigue and generalized failure to thrive along with hypoxia and tachycardia) the first cycle was even given at a low dose 2. Followed by adjuvant radiation therapy (patient does not want to receive radiation)  Severe COPD: 24-hour oxygen -------------------------------------------------------------------------------------------------------------------------------------------- Given her poor performance status, she will go on to surveillance. Hospitalization: GI bleed: 4 units of PRBC given November 2021: Today's hemoglobin is 12.2. I ordered a mammogram for August 2022  return to clinic in 1 year for follow-up   No orders of the  defined types were placed in this encounter.  The patient has a good understanding of the overall plan. she agrees with it. she will call with any problems that may develop before the next visit here.  Total time spent: 20 mins including face to face time and time spent for planning, charting and coordination of care  Rulon Eisenmenger, MD, MPH 09/16/2020  I, Cloyde Reams Dorshimer, am acting as scribe for Dr. Nicholas Lose.  I have reviewed the above documentation for accuracy and completeness, and I agree with the above.

## 2020-09-16 ENCOUNTER — Inpatient Hospital Stay: Payer: BLUE CROSS/BLUE SHIELD | Attending: Internal Medicine | Admitting: Hematology and Oncology

## 2020-09-16 ENCOUNTER — Inpatient Hospital Stay: Payer: BLUE CROSS/BLUE SHIELD

## 2020-09-16 ENCOUNTER — Other Ambulatory Visit: Payer: Self-pay

## 2020-09-16 VITALS — BP 110/77 | HR 101 | Temp 97.7°F | Resp 17 | Ht 62.0 in | Wt 197.9 lb

## 2020-09-16 DIAGNOSIS — Z9221 Personal history of antineoplastic chemotherapy: Secondary | ICD-10-CM | POA: Diagnosis not present

## 2020-09-16 DIAGNOSIS — Z9981 Dependence on supplemental oxygen: Secondary | ICD-10-CM | POA: Insufficient documentation

## 2020-09-16 DIAGNOSIS — C3432 Malignant neoplasm of lower lobe, left bronchus or lung: Secondary | ICD-10-CM | POA: Insufficient documentation

## 2020-09-16 DIAGNOSIS — C50412 Malignant neoplasm of upper-outer quadrant of left female breast: Secondary | ICD-10-CM | POA: Diagnosis not present

## 2020-09-16 DIAGNOSIS — Z171 Estrogen receptor negative status [ER-]: Secondary | ICD-10-CM | POA: Insufficient documentation

## 2020-09-16 DIAGNOSIS — J449 Chronic obstructive pulmonary disease, unspecified: Secondary | ICD-10-CM | POA: Insufficient documentation

## 2020-09-16 DIAGNOSIS — Z95828 Presence of other vascular implants and grafts: Secondary | ICD-10-CM

## 2020-09-16 LAB — CBC WITH DIFFERENTIAL (CANCER CENTER ONLY)
Abs Immature Granulocytes: 0.04 10*3/uL (ref 0.00–0.07)
Basophils Absolute: 0.1 10*3/uL (ref 0.0–0.1)
Basophils Relative: 1 %
Eosinophils Absolute: 0.2 10*3/uL (ref 0.0–0.5)
Eosinophils Relative: 2 %
HCT: 38.9 % (ref 36.0–46.0)
Hemoglobin: 12.2 g/dL (ref 12.0–15.0)
Immature Granulocytes: 0 %
Lymphocytes Relative: 10 %
Lymphs Abs: 1 10*3/uL (ref 0.7–4.0)
MCH: 27.5 pg (ref 26.0–34.0)
MCHC: 31.4 g/dL (ref 30.0–36.0)
MCV: 87.8 fL (ref 80.0–100.0)
Monocytes Absolute: 0.8 10*3/uL (ref 0.1–1.0)
Monocytes Relative: 8 %
Neutro Abs: 8.4 10*3/uL — ABNORMAL HIGH (ref 1.7–7.7)
Neutrophils Relative %: 79 %
Platelet Count: 315 10*3/uL (ref 150–400)
RBC: 4.43 MIL/uL (ref 3.87–5.11)
RDW: 14.5 % (ref 11.5–15.5)
WBC Count: 10.4 10*3/uL (ref 4.0–10.5)
nRBC: 0 % (ref 0.0–0.2)

## 2020-09-16 LAB — CMP (CANCER CENTER ONLY)
ALT: 31 U/L (ref 0–44)
AST: 27 U/L (ref 15–41)
Albumin: 3.2 g/dL — ABNORMAL LOW (ref 3.5–5.0)
Alkaline Phosphatase: 83 U/L (ref 38–126)
Anion gap: 8 (ref 5–15)
BUN: 13 mg/dL (ref 8–23)
CO2: 31 mmol/L (ref 22–32)
Calcium: 9.3 mg/dL (ref 8.9–10.3)
Chloride: 97 mmol/L — ABNORMAL LOW (ref 98–111)
Creatinine: 0.73 mg/dL (ref 0.44–1.00)
GFR, Estimated: >60 mL/min (ref >60)
Glucose, Bld: 116 mg/dL — ABNORMAL HIGH (ref 70–99)
Potassium: 3.7 mmol/L (ref 3.5–5.1)
Sodium: 136 mmol/L (ref 135–145)
Total Bilirubin: 0.3 mg/dL (ref 0.3–1.2)
Total Protein: 8 g/dL (ref 6.5–8.1)

## 2020-09-16 MED ORDER — SODIUM CHLORIDE 0.9% FLUSH
10.0000 mL | Freq: Once | INTRAVENOUS | Status: AC
  Administered 2020-09-16: 10 mL
  Filled 2020-09-16: qty 10

## 2020-09-16 MED ORDER — HEPARIN SOD (PORK) LOCK FLUSH 100 UNIT/ML IV SOLN
500.0000 [IU] | Freq: Once | INTRAVENOUS | Status: AC
  Administered 2020-09-16: 500 [IU]
  Filled 2020-09-16: qty 5

## 2020-09-16 NOTE — Assessment & Plan Note (Signed)
04/08/20:Left lumpectomy [name]): three foci of IDC, grade 3, 3.3cm, 1.0cm, 0.7cm, with high grade DCIS, clear margins, 2 left axillary lymph nodes negative for carcinoma. HER-2 negative (0), ER/PR negative, Ki67 40%.   Treatment Plan: 1.Adjuvant chemotherapy with Adriamycin and Cytoxan dose dense 4 followed by Taxol weekly 12 (discontinued after cycle 1 because of profound fatigue and generalized failure to thrive along with hypoxia and tachycardia) the first cycle was even given at a low dose 2. Followed by adjuvant radiation therapy (patient does not want to receive radiation)  Severe COPD: 24-hour oxygen -------------------------------------------------------------------------------------------------------------------------------------------- Given her poor performance status, she will go on to surveillance.

## 2020-09-20 ENCOUNTER — Telehealth: Payer: Self-pay | Admitting: Hematology and Oncology

## 2020-09-20 NOTE — Telephone Encounter (Signed)
Scheduled per 3/3 los. Called and spoke with pt, confirmed added appt

## 2020-09-21 DIAGNOSIS — H01111 Allergic dermatitis of right upper eyelid: Secondary | ICD-10-CM | POA: Diagnosis not present

## 2020-11-04 ENCOUNTER — Other Ambulatory Visit: Payer: Self-pay | Admitting: Primary Care

## 2020-11-04 DIAGNOSIS — I272 Pulmonary hypertension, unspecified: Secondary | ICD-10-CM

## 2020-11-04 DIAGNOSIS — I5032 Chronic diastolic (congestive) heart failure: Secondary | ICD-10-CM

## 2020-11-04 NOTE — Telephone Encounter (Signed)
Deborah [name], Patient is due for follow up now, this will be required prior to any further refills.  Please schedule.   Will also route to cardiology, looks like she's taking differently that what was prescribed. Will defer to cards.

## 2020-11-04 NOTE — Telephone Encounter (Signed)
Given recent issues with weight loss, will refill but at a lower dose of 20 mg daily.

## 2020-11-24 NOTE — Telephone Encounter (Signed)
Pt scheduled for follow up on 5/25

## 2020-12-06 ENCOUNTER — Encounter: Payer: Self-pay | Admitting: Internal Medicine

## 2020-12-07 ENCOUNTER — Telehealth: Payer: Self-pay | Admitting: Hematology and Oncology

## 2020-12-07 NOTE — Telephone Encounter (Signed)
Scheduled appts per 5/24 sch msg. Pt aware.

## 2020-12-08 ENCOUNTER — Inpatient Hospital Stay: Payer: BLUE CROSS/BLUE SHIELD | Attending: Internal Medicine

## 2020-12-08 ENCOUNTER — Encounter: Payer: Self-pay | Admitting: Primary Care

## 2020-12-08 ENCOUNTER — Ambulatory Visit: Payer: BLUE CROSS/BLUE SHIELD | Admitting: Primary Care

## 2020-12-08 ENCOUNTER — Other Ambulatory Visit: Payer: Self-pay

## 2020-12-08 VITALS — BP 104/82 | HR 133 | Temp 98.2°F | Ht 62.0 in | Wt 191.0 lb

## 2020-12-08 DIAGNOSIS — C3432 Malignant neoplasm of lower lobe, left bronchus or lung: Secondary | ICD-10-CM | POA: Diagnosis not present

## 2020-12-08 DIAGNOSIS — I5032 Chronic diastolic (congestive) heart failure: Secondary | ICD-10-CM

## 2020-12-08 DIAGNOSIS — I10 Essential (primary) hypertension: Secondary | ICD-10-CM | POA: Diagnosis not present

## 2020-12-08 DIAGNOSIS — Z452 Encounter for adjustment and management of vascular access device: Secondary | ICD-10-CM | POA: Diagnosis not present

## 2020-12-08 DIAGNOSIS — Z171 Estrogen receptor negative status [ER-]: Secondary | ICD-10-CM

## 2020-12-08 DIAGNOSIS — J9611 Chronic respiratory failure with hypoxia: Secondary | ICD-10-CM

## 2020-12-08 DIAGNOSIS — C50412 Malignant neoplasm of upper-outer quadrant of left female breast: Secondary | ICD-10-CM

## 2020-12-08 DIAGNOSIS — F32A Depression, unspecified: Secondary | ICD-10-CM

## 2020-12-08 DIAGNOSIS — R7303 Prediabetes: Secondary | ICD-10-CM

## 2020-12-08 DIAGNOSIS — E1165 Type 2 diabetes mellitus with hyperglycemia: Secondary | ICD-10-CM | POA: Insufficient documentation

## 2020-12-08 DIAGNOSIS — Z95828 Presence of other vascular implants and grafts: Secondary | ICD-10-CM

## 2020-12-08 DIAGNOSIS — J432 Centrilobular emphysema: Secondary | ICD-10-CM

## 2020-12-08 MED ORDER — HEPARIN SOD (PORK) LOCK FLUSH 100 UNIT/ML IV SOLN
500.0000 [IU] | Freq: Once | INTRAVENOUS | Status: AC
  Administered 2020-12-08: 500 [IU]
  Filled 2020-12-08: qty 5

## 2020-12-08 MED ORDER — SODIUM CHLORIDE 0.9% FLUSH
10.0000 mL | Freq: Once | INTRAVENOUS | Status: AC
  Administered 2020-12-08: 10 mL
  Filled 2020-12-08: qty 10

## 2020-12-08 NOTE — Assessment & Plan Note (Signed)
Lumpectomy in September 2021, completed 1 course of chemotherapy, no radiation.  Following with oncology. Mammogram due in September 2021.

## 2020-12-08 NOTE — Assessment & Plan Note (Signed)
Has yet to start [facility] inhaler. Infrequent use of albuterol. Continue supplemental oxygen. Follows with pulmonology

## 2020-12-08 NOTE — Assessment & Plan Note (Signed)
Follows with pulmonology, endorses feeling stable. Lungs with rhonchi throughout, she doesn't appear acutely ill.  Continue supplemental oxygen at 4 liters. Repeat CT chest due soon, she is aware.

## 2020-12-08 NOTE — Assessment & Plan Note (Signed)
A1C of 6.0 in Fall 2021, repeat A1C pending.

## 2020-12-08 NOTE — Assessment & Plan Note (Signed)
Appears euvolemic today, follows with cardiology. Continue furosemide 20 mg daily, diltiazem 240 mg daily.  CMP reviewed.

## 2020-12-08 NOTE — Assessment & Plan Note (Signed)
Denies concerns today. Continue to monitor.

## 2020-12-08 NOTE — Progress Notes (Signed)
Subjective:    Patient ID: Deborah [name], female    DOB: [birth year], 65 y.o.   MRN: [id number]  HPI  Deborah [name] is a very pleasant 65 y.o. female with a significant medical history including CHF, hypertension, chronic restrictive lung disease, OSA, respiratory failure, sepsis, breast cancer who presents today for follow-up of medical conditions.  1) CHF/pulmonary hypertension: Following with cardiology, last visit was in February 2022.  Last echocardiogram was in November 2021, LVEF of 55 to 60%.  Was noted to be tachycardic during this visit.  Her furosemide 40 mg daily and potassium supplement were continued.  She is prescribed diltiazem to 240 mg for which was continued.  She monitors her heart rate at home when resting which runs 90's. She endorses that her HR elevates when standing and walking. She does feel dizzy at times with positional changes. She quit smoking in 2014.   2) COPD/Chronic Respiratory Failure/Lung Cancer: Following with pulmonology, is on 4 L of continuous oxygen. Due for repeat CT chest soon. She's not using Breztri inhaler until she sees pulmonology due to concerns about side effects. She is using albuterol infrequently.   3) Breast Cancer: Diagnosed in July 2021.  To left breast, lumpectomy in September 2021.  Following with oncology, completed one dose chemotherapy and never completed radiation.  Last visit was in March 2022. Mammogram is due in September 2021.    Review of Systems  Constitutional: Negative for fever.  Eyes: Negative for visual disturbance.  Respiratory: Positive for shortness of breath.        Chronic, stable  Cardiovascular: Negative for chest pain.  Neurological: Positive for dizziness.         Past Medical History:  Diagnosis Date  . Anemia    as teen  . Asthma   . Breast cancer ([place])    left breast cancer 02/2020  . CHF (congestive heart failure) ([place])   . COPD, mild ([place])   . Depression   . Diverticulitis   . Family  history of anesthesia complication    vomiting  . GI bleeding   . Heart failure ([place])    New onset 07/25/14  . Histoplasmosis    left eye  . Hyperkalemia   . Hypertension   . Lung cancer ([place]) 05/02/2018   s/p chemoradiation, immunotherapy  . Obesity (BMI 30-39.9)   . Pneumonia    dx wtih pneumonia on 05/27/16- seen by [facility]   . PONV (postoperative nausea and vomiting)   . Restrictive lung disease   . Shortness of breath    with exertion   . Sleep apnea    mask and oxygen at nite for sleep at 2L   . Tobacco abuse   . Umbilical hernia     Social History   Socioeconomic History  . Marital status: Married    Spouse name: Not on file  . Number of children: 1  . Years of education: Not on file  . Highest education level: Not on file  Occupational History  . Occupation: Retired    Fish farm manager: NOT EMPLOYED  Tobacco Use  . Smoking status: Former Smoker    Packs/day: 3.00    Years: 43.00    Pack years: 129.00    Types: Cigarettes    Quit date: 12/17/2012    Years since quitting: 7.9  . Smokeless tobacco: Never Used  Vaping Use  . Vaping Use: Former  Substance and Sexual Activity  . Alcohol use: No  Alcohol/week: 0.0 standard drinks  . Drug use: No  . Sexual activity: Not on file  Other Topics Concern  . Not on file  Social History Narrative   ** Merged History Encounter **       ** Data from: 07/25/14 Enc Dept: [facility]       ** Data from: 06/05/13 Enc Dept: [facility]   Married, one son 23 yo.            Social Determinants of Health   Financial Resource Strain: Not on file  Food Insecurity: Not on file  Transportation Needs: Not on file  Physical Activity: Not on file  Stress: Not on file  Social Connections: Not on file  Intimate Partner Violence: Not on file    Past Surgical History:  Procedure Laterality Date  . APPLICATION OF WOUND VAC  03/19/2013   Procedure: APPLICATION OF WOUND VAC;  Surgeon: Madilyn Hook, DO;  Location: [facility];  Service: General;;  . BREAST LUMPECTOMY WITH RADIOACTIVE SEED AND SENTINEL LYMPH NODE BIOPSY Left 04/08/2020   Procedure: LEFT BREAST LUMPECTOMY WITH RADIOACTIVE SEED AND LEFT AXILLARY SENTINEL LYMPH NODE BIOPSY;  Surgeon: Alphonsa Overall, MD;  Location: [place];  Service: General;  Laterality: Left;  PEC BLOCK, NEEDS ANESTHESIA CONSULT  . CESAREAN SECTION  04/12/85  . COLONOSCOPY WITH PROPOFOL N/A 06/13/2016   Procedure: COLONOSCOPY WITH PROPOFOL;  Surgeon: Jerene Bears, MD;  Location: [facility];  Service: Gastroenterology;  Laterality: N/A;  . ERCP N/A 02/15/2017   Procedure: ENDOSCOPIC RETROGRADE CHOLANGIOPANCREATOGRAPHY (ERCP);  Surgeon: [name], MD;  Location: [facility];  Service: Endoscopy;  Laterality: N/A;  . INSERTION OF MESH N/A 03/19/2013   Procedure: INSERTION OF MESH;  Surgeon: Madilyn Hook, DO;  Location: [facility];  Service: General;  Laterality: N/A;  . IR IMAGING GUIDED PORT INSERTION  03/26/2020  . IR THORACENTESIS ASP PLEURAL SPACE W/IMG GUIDE  05/25/2020  . VENTRAL HERNIA REPAIR N/A 03/19/2013   Procedure:  OPEN VENTRAL HERNIA REPAIR WITH MESH AND APPLICATION OF WOUND VAC;  Surgeon: Madilyn Hook, DO;  Location: [facility];  Service: General;  Laterality: N/A;  . VIDEO BRONCHOSCOPY Bilateral 04/19/2018   Procedure: VIDEO BRONCHOSCOPY WITH FLUORO;  Surgeon: Rigoberto Noel, MD;  Location: [facility];  Service: Cardiopulmonary;  Laterality: Bilateral;    Family History  Problem Relation Age of Onset  . Heart attack Mother   . Emphysema Mother        was a smoker  . Congestive Heart Failure Father   . Bipolar disorder Father   . Lymphoma Maternal Uncle   . Pancreatic cancer Maternal Aunt   . Bipolar disorder Sister   . Schizophrenia Sister   . Other Sister        tumor in her neck  . Melanoma Sister   . Heart attack Maternal Uncle   . Heart attack Maternal Uncle   . Colon cancer Neg Hx   . Breast cancer Neg Hx     No Known Allergies  Current Outpatient  Medications on File Prior to Visit  Medication Sig Dispense Refill  . acetaminophen (TYLENOL) 325 MG tablet Take 650 mg by mouth every 6 (six) hours as needed for headache (pain).    Marland Kitchen albuterol (VENTOLIN HFA) 108 (90 Base) MCG/ACT inhaler Inhale 2 puffs into the lungs every 6 (six) hours as needed for wheezing or shortness of breath. 8 g 3  . aspirin 81 MG chewable tablet Chew 1 tablet (81 mg total)  by mouth daily. 30 tablet 1  . Aspirin-Caffeine (BC FAST PAIN RELIEF ARTHRITIS PO) Take 1 packet by mouth daily as needed (back pain).    . Budeson-Glycopyrrol-Formoterol (BREZTRI AEROSPHERE) 160-9-4.8 MCG/ACT AERO Inhale 2 puffs into the lungs daily. (Patient taking differently: Inhale 2 puffs into the lungs daily as needed (shortness of breath/wheezing).) 5.9 g 0  . diltiazem (CARDIZEM CD) 240 MG 24 hr capsule TAKE 1 CAPSULE BY MOUTH EVERY DAY 30 capsule 6  . furosemide (LASIX) 20 MG tablet Take 1 tablet (20 mg total) by mouth daily. 90 tablet 1  . ipratropium-albuterol (DUONEB) 0.5-2.5 (3) MG/3ML SOLN Take 3 mLs by nebulization every 3 (three) hours as needed. (Patient taking differently: Take 3 mLs by nebulization every 3 (three) hours as needed (shortness of breath/wheezing).) 360 mL 11  . montelukast (SINGULAIR) 10 MG tablet Take 1 tablet (10 mg total) by mouth at bedtime. 90 tablet 2  . OXYGEN Inhale 4 L/min into the lungs continuous.     . potassium chloride SA (KLOR-CON M20) 20 MEQ tablet TAKE 1/2 TABLET BY MOUTH EVERY DAY (Patient taking differently: Take 10 mEq by mouth daily.) 45 tablet 1  . Spacer/Aero-Holding Chambers (AEROCHAMBER MV) inhaler Use as instructed 1 each 0  . [DISCONTINUED] prochlorperazine (COMPAZINE) 10 MG tablet Take 1 tablet (10 mg total) by mouth every 6 (six) hours as needed (Nausea or vomiting). 30 tablet 1   No current facility-administered medications on file prior to visit.    BP 104/82   Pulse (!) 133   Temp 98.2 F (36.8 C) (Temporal)   Ht 5\' 2"  (1.575 m)    Wt 191 lb (86.6 kg)   SpO2 95%   BMI 34.93 kg/m  Objective:   Physical Exam Cardiovascular:     Rate and Rhythm: Regular rhythm. Tachycardia present.  Pulmonary:     Effort: Pulmonary effort is normal.     Breath sounds: Examination of the right-upper field reveals rhonchi. Examination of the left-upper field reveals rhonchi. Examination of the right-lower field reveals rhonchi. Examination of the left-lower field reveals rhonchi. Rhonchi present.  Musculoskeletal:     Cervical back: Neck supple.  Skin:    General: Skin is warm and dry.           Assessment & Plan:      This visit occurred during the SARS-CoV-2 public health emergency.  Safety protocols were in place, including screening questions prior to the visit, additional usage of staff PPE, and extensive cleaning of exam room while observing appropriate contact time as indicated for disinfecting solutions.

## 2020-12-08 NOTE — Assessment & Plan Note (Signed)
Lower end of normal today, historically is the case but overall tolerates well.   Continue diltiazem 240 mg which is necessary for HR control. Continue furosemide 20 mg.    BP Readings from Last 3 Encounters:  12/08/20 104/82  09/16/20 110/77  08/27/20 104/68

## 2020-12-08 NOTE — Patient Instructions (Signed)
Stop by the lab prior to leaving today. I will notify you of your results once received.   It was a pleasure to see you today!  

## 2020-12-09 LAB — LIPID PANEL
Cholesterol: 212 mg/dL — ABNORMAL HIGH (ref 0–200)
HDL: 42.8 mg/dL (ref >39.00)
NonHDL: 169.47
Total CHOL/HDL Ratio: 5
Triglycerides: 217 mg/dL — ABNORMAL HIGH (ref 0.0–149.0)
VLDL: 43.4 mg/dL — ABNORMAL HIGH (ref 0.0–40.0)

## 2020-12-09 LAB — HEMOGLOBIN A1C: Hgb A1c MFr Bld: 5.5 % (ref 4.6–6.5)

## 2020-12-09 LAB — LDL CHOLESTEROL, DIRECT: Direct LDL: 148 mg/dL

## 2020-12-10 DIAGNOSIS — E785 Hyperlipidemia, unspecified: Secondary | ICD-10-CM

## 2020-12-10 DIAGNOSIS — I700 Atherosclerosis of aorta: Secondary | ICD-10-CM

## 2020-12-14 MED ORDER — ROSUVASTATIN CALCIUM 5 MG PO TABS: 5.0000 mg | ORAL_TABLET | Freq: Every day | ORAL | 3 refills | Status: DC

## 2021-01-19 ENCOUNTER — Telehealth: Payer: Self-pay | Admitting: Hematology and Oncology

## 2021-01-19 NOTE — Telephone Encounter (Signed)
R/s appt per 7/6 sch msg. Pt aware.

## 2021-01-24 ENCOUNTER — Telehealth: Payer: Self-pay | Admitting: *Deleted

## 2021-01-24 NOTE — Telephone Encounter (Signed)
Agree with precautions and will evaluated Thursday

## 2021-01-24 NOTE — Telephone Encounter (Signed)
Patient called stating that she noticed a lump on the back of her left calf Saturday night. Patient stated the lump is about the size of a small coin. Patient stated that she does not have any pain in her calf and denies redness or any warmth to the calf. Patient denies any other symptoms and stated the only reason that she noticed the lump is because she was rubbing her leg. Patient stated that the lump may be a little smaller today then when she first felt it. Patient denies SOB or difficulty breathing. Patient scheduled an appointment with Dr. Einar Pheasant 01/27/21 at 11:00 am. Patient stated that she can not come for an earlier appointment because her husband is working. Patient was given ER precautions and she verbalized understanding.

## 2021-01-27 ENCOUNTER — Ambulatory Visit: Payer: BLUE CROSS/BLUE SHIELD | Admitting: Family Medicine

## 2021-01-27 ENCOUNTER — Other Ambulatory Visit: Payer: Self-pay

## 2021-01-27 VITALS — BP 90/60 | HR 118 | Temp 99.4°F | Ht 62.0 in | Wt 199.8 lb

## 2021-01-27 DIAGNOSIS — L819 Disorder of pigmentation, unspecified: Secondary | ICD-10-CM

## 2021-01-27 DIAGNOSIS — R2242 Localized swelling, mass and lump, left lower limb: Secondary | ICD-10-CM | POA: Diagnosis not present

## 2021-01-27 DIAGNOSIS — R21 Rash and other nonspecific skin eruption: Secondary | ICD-10-CM | POA: Diagnosis not present

## 2021-01-27 NOTE — Assessment & Plan Note (Signed)
Possible rosacea but diffuse. Discussed dermatology referral given failure of other treatment and complexity of chemo exposure

## 2021-01-27 NOTE — Assessment & Plan Note (Signed)
Suspect cyst vs lipoma. Advised watch and wait.

## 2021-01-27 NOTE — Patient Instructions (Signed)
#  Referral I have placed a referral to a specialist for you. You should receive a phone call from the specialty office. Make sure your voicemail is not full and that if you are able to answer your phone to unknown or new numbers.   It may take up to 2 weeks to hear about the referral. If you do not hear anything in 2 weeks, please call our office and ask to speak with the referral coordinator.   Skin lesion (lump) - call if increasing in size  - or pain or redness - otherwise just monitor  Rash - dermatology referral

## 2021-01-27 NOTE — Progress Notes (Signed)
Subjective:     Deborah [name] is a 65 y.o. female presenting for Mass (Golf ball size on L calf x 6 days. No pain and it has been in same location whole time )     HPI  #face rash - vaseline - olive oil - baby oil - cerave - is on chemo therapy and has not been right since oct 2021 - does not like to get out because she is scared of covid  - did try cortisone 10 and that seemed to help   #mass - in the left calf - x 6 days - started with an itch - no pain  - thinks it is decreasing in size  Review of Systems   Social History   Tobacco Use  Smoking Status Former   Packs/day: 3.00   Years: 43.00   Pack years: 129.00   Types: Cigarettes   Quit date: 12/17/2012   Years since quitting: 8.1  Smokeless Tobacco Never        Objective:    BP Readings from Last 3 Encounters:  01/27/21 90/60  12/08/20 104/82  09/16/20 110/77   Wt Readings from Last 3 Encounters:  01/27/21 199 lb 12 oz (90.6 kg)  12/08/20 191 lb (86.6 kg)  09/16/20 197 lb 14.4 oz (89.8 kg)    BP 90/60   Pulse (!) 118   Temp 99.4 F (37.4 C) (Temporal)   Ht 5\' 2"  (1.575 m)   Wt 199 lb 12 oz (90.6 kg)   SpO2 99%   BMI 36.53 kg/m    Physical Exam Constitutional:      General: She is not in acute distress.    Appearance: She is well-developed. She is not diaphoretic.  HENT:     Right Ear: External ear normal.     Left Ear: External ear normal.  Eyes:     Conjunctiva/sclera: Conjunctivae normal.  Cardiovascular:     Rate and Rhythm: Normal rate.  Pulmonary:     Effort: Pulmonary effort is normal.     Comments: Nasal canula in place. Breathing comfortably Musculoskeletal:     Cervical back: Neck supple.  Skin:    General: Skin is warm and dry.     Capillary Refill: Capillary refill takes less than 2 seconds.     Comments: Face - erythema along the entire face - forehead, cheeks, as well as dry peeling skin.  Bilateral arms - blue discoloration of the volvar surface of  bilateral lower arms, prominent vasculature Left calf - mobile, soft nodule below the skin. No erythema, tenderness, swelling  Neurological:     Mental Status: She is alert. Mental status is at baseline.  Psychiatric:        Mood and Affect: Mood normal.        Behavior: Behavior normal.          Assessment & Plan:   Problem List Items Addressed This Visit       Musculoskeletal and Integument   Rash of face - Primary    Possible rosacea but diffuse. Discussed dermatology referral given failure of other treatment and complexity of chemo exposure       Relevant Orders   Ambulatory referral to Dermatology     Other   Nodule of skin of left lower leg    Suspect cyst vs lipoma. Advised watch and wait.        Other Visit Diagnoses     Discoloration of skin  Relevant Orders   Ambulatory referral to Dermatology      Discoloration - suspect this may be related to dependent positioning of arms. Referral to dermatology for further evaluation   Return if symptoms worsen or fail to improve.  Lesleigh Noe, MD  This visit occurred during the SARS-CoV-2 public health emergency.  Safety protocols were in place, including screening questions prior to the visit, additional usage of staff PPE, and extensive cleaning of exam room while observing appropriate contact time as indicated for disinfecting solutions.

## 2021-01-28 ENCOUNTER — Inpatient Hospital Stay: Payer: BLUE CROSS/BLUE SHIELD

## 2021-02-02 ENCOUNTER — Other Ambulatory Visit: Payer: Self-pay | Admitting: Primary Care

## 2021-02-02 DIAGNOSIS — E876 Hypokalemia: Secondary | ICD-10-CM

## 2021-02-03 ENCOUNTER — Inpatient Hospital Stay: Payer: BLUE CROSS/BLUE SHIELD

## 2021-02-04 ENCOUNTER — Inpatient Hospital Stay: Payer: BLUE CROSS/BLUE SHIELD | Attending: Internal Medicine

## 2021-02-04 ENCOUNTER — Other Ambulatory Visit: Payer: Self-pay

## 2021-02-04 DIAGNOSIS — Z452 Encounter for adjustment and management of vascular access device: Secondary | ICD-10-CM | POA: Diagnosis not present

## 2021-02-04 DIAGNOSIS — C50412 Malignant neoplasm of upper-outer quadrant of left female breast: Secondary | ICD-10-CM | POA: Insufficient documentation

## 2021-02-04 DIAGNOSIS — Z171 Estrogen receptor negative status [ER-]: Secondary | ICD-10-CM | POA: Insufficient documentation

## 2021-02-04 DIAGNOSIS — Z95828 Presence of other vascular implants and grafts: Secondary | ICD-10-CM

## 2021-02-04 MED ORDER — SODIUM CHLORIDE 0.9% FLUSH
10.0000 mL | Freq: Once | INTRAVENOUS | Status: AC
  Administered 2021-02-04: 10 mL
  Filled 2021-02-04: qty 10

## 2021-02-04 MED ORDER — HEPARIN SOD (PORK) LOCK FLUSH 100 UNIT/ML IV SOLN
500.0000 [IU] | Freq: Once | INTRAVENOUS | Status: AC
  Administered 2021-02-04: 500 [IU]
  Filled 2021-02-04: qty 5

## 2021-02-04 NOTE — Progress Notes (Signed)
Unable to get blood return. Patient advised to have cathflo next visit.

## 2021-02-04 NOTE — Patient Instructions (Signed)

## 2021-02-11 ENCOUNTER — Encounter: Payer: Self-pay | Admitting: Primary Care

## 2021-03-01 ENCOUNTER — Telehealth: Payer: Self-pay

## 2021-03-01 NOTE — Telephone Encounter (Signed)
Palliative follow up call made to check on patient and schedule follow up appt.  No answer, message left

## 2021-03-02 ENCOUNTER — Inpatient Hospital Stay: Payer: BLUE CROSS/BLUE SHIELD | Attending: Internal Medicine

## 2021-03-08 ENCOUNTER — Ambulatory Visit: Payer: BLUE CROSS/BLUE SHIELD | Admitting: Cardiovascular Disease

## 2021-04-13 ENCOUNTER — Inpatient Hospital Stay: Payer: BLUE CROSS/BLUE SHIELD | Attending: Internal Medicine

## 2021-04-13 ENCOUNTER — Other Ambulatory Visit: Payer: Self-pay

## 2021-04-13 DIAGNOSIS — Z452 Encounter for adjustment and management of vascular access device: Secondary | ICD-10-CM | POA: Insufficient documentation

## 2021-04-13 DIAGNOSIS — C3432 Malignant neoplasm of lower lobe, left bronchus or lung: Secondary | ICD-10-CM | POA: Insufficient documentation

## 2021-04-13 DIAGNOSIS — C50412 Malignant neoplasm of upper-outer quadrant of left female breast: Secondary | ICD-10-CM | POA: Insufficient documentation

## 2021-04-13 DIAGNOSIS — Z171 Estrogen receptor negative status [ER-]: Secondary | ICD-10-CM | POA: Insufficient documentation

## 2021-04-13 DIAGNOSIS — Z95828 Presence of other vascular implants and grafts: Secondary | ICD-10-CM

## 2021-04-13 MED ORDER — HEPARIN SOD (PORK) LOCK FLUSH 100 UNIT/ML IV SOLN
500.0000 [IU] | Freq: Once | INTRAVENOUS | Status: AC
  Administered 2021-04-13: 500 [IU]

## 2021-04-13 MED ORDER — SODIUM CHLORIDE 0.9% FLUSH
10.0000 mL | Freq: Once | INTRAVENOUS | Status: AC
  Administered 2021-04-13: 10 mL

## 2021-04-18 ENCOUNTER — Telehealth: Payer: Self-pay | Admitting: Medical Oncology

## 2021-04-18 NOTE — Telephone Encounter (Signed)
Pt requesting CT scan and appt with [facility] ( Last seen 01/2020 for her lung cancer) .  She had a lot  going on when she was under the care of Dr [name] for a while.   No recent labs or scans.  Please advise.

## 2021-04-20 ENCOUNTER — Other Ambulatory Visit: Payer: Self-pay | Admitting: Medical Oncology

## 2021-04-20 DIAGNOSIS — C3490 Malignant neoplasm of unspecified part of unspecified bronchus or lung: Secondary | ICD-10-CM

## 2021-04-21 ENCOUNTER — Telehealth: Payer: Self-pay | Admitting: Internal Medicine

## 2021-04-21 NOTE — Telephone Encounter (Signed)
Per 10/6 sch msg, pt has been called and confirmed appts

## 2021-04-25 ENCOUNTER — Other Ambulatory Visit: Payer: BLUE CROSS/BLUE SHIELD

## 2021-04-25 ENCOUNTER — Other Ambulatory Visit: Payer: Self-pay

## 2021-04-25 ENCOUNTER — Inpatient Hospital Stay: Payer: BLUE CROSS/BLUE SHIELD | Attending: Internal Medicine

## 2021-04-25 ENCOUNTER — Other Ambulatory Visit: Payer: Self-pay | Admitting: *Deleted

## 2021-04-25 ENCOUNTER — Ambulatory Visit (HOSPITAL_COMMUNITY)
Admission: RE | Admit: 2021-04-25 | Discharge: 2021-04-25 | Disposition: A | Discharge status: Home / Self Care | Payer: BLUE CROSS/BLUE SHIELD | Source: Ambulatory Visit | Attending: Internal Medicine | Admitting: Internal Medicine

## 2021-04-25 DIAGNOSIS — C3490 Malignant neoplasm of unspecified part of unspecified bronchus or lung: Secondary | ICD-10-CM | POA: Diagnosis not present

## 2021-04-25 DIAGNOSIS — I700 Atherosclerosis of aorta: Secondary | ICD-10-CM | POA: Diagnosis not present

## 2021-04-25 DIAGNOSIS — J90 Pleural effusion, not elsewhere classified: Secondary | ICD-10-CM | POA: Diagnosis not present

## 2021-04-25 DIAGNOSIS — C50412 Malignant neoplasm of upper-outer quadrant of left female breast: Secondary | ICD-10-CM | POA: Insufficient documentation

## 2021-04-25 DIAGNOSIS — C3432 Malignant neoplasm of lower lobe, left bronchus or lung: Secondary | ICD-10-CM | POA: Insufficient documentation

## 2021-04-25 DIAGNOSIS — J479 Bronchiectasis, uncomplicated: Secondary | ICD-10-CM | POA: Diagnosis not present

## 2021-04-25 LAB — CMP (CANCER CENTER ONLY)
ALT: 10 U/L (ref 0–44)
AST: 14 U/L — ABNORMAL LOW (ref 15–41)
Albumin: 3.2 g/dL — ABNORMAL LOW (ref 3.5–5.0)
Alkaline Phosphatase: 99 U/L (ref 38–126)
Anion gap: 11 (ref 5–15)
BUN: 15 mg/dL (ref 8–23)
CO2: 31 mmol/L (ref 22–32)
Calcium: 9.4 mg/dL (ref 8.9–10.3)
Chloride: 100 mmol/L (ref 98–111)
Creatinine: 0.77 mg/dL (ref 0.44–1.00)
GFR, Estimated: >60 mL/min (ref >60)
Glucose, Bld: 114 mg/dL — ABNORMAL HIGH (ref 70–99)
Potassium: 4.2 mmol/L (ref 3.5–5.1)
Sodium: 142 mmol/L (ref 135–145)
Total Bilirubin: 0.3 mg/dL (ref 0.3–1.2)
Total Protein: 7.9 g/dL (ref 6.5–8.1)

## 2021-04-25 MED ORDER — IOHEXOL 350 MG/ML SOLN
75.0000 mL | Freq: Once | INTRAVENOUS | Status: AC | PRN
  Administered 2021-04-25: 75 mL via INTRAVENOUS

## 2021-04-28 ENCOUNTER — Other Ambulatory Visit: Payer: Self-pay

## 2021-04-28 ENCOUNTER — Inpatient Hospital Stay: Payer: BLUE CROSS/BLUE SHIELD | Admitting: Internal Medicine

## 2021-04-28 VITALS — BP 140/65 | HR 114 | Temp 96.7°F | Resp 18 | Wt 202.5 lb

## 2021-04-28 DIAGNOSIS — C3490 Malignant neoplasm of unspecified part of unspecified bronchus or lung: Secondary | ICD-10-CM

## 2021-04-28 DIAGNOSIS — C3432 Malignant neoplasm of lower lobe, left bronchus or lung: Secondary | ICD-10-CM | POA: Insufficient documentation

## 2021-04-28 DIAGNOSIS — J90 Pleural effusion, not elsewhere classified: Secondary | ICD-10-CM | POA: Diagnosis not present

## 2021-04-28 DIAGNOSIS — C50412 Malignant neoplasm of upper-outer quadrant of left female breast: Secondary | ICD-10-CM | POA: Diagnosis not present

## 2021-04-28 NOTE — Progress Notes (Signed)
[facility] [phone]   [phone]  OFFICE PROGRESS NOTE  Pleas Koch, NP [place] [address]  DIAGNOSIS:  1) stage IIIB (T3, N2, M0) non-small cell lung cancer, squamous cell carcinoma presented with large left lower lobe lung mass in addition to mediastinal lymphadenopathy diagnosed in October 2019. PDL 1 expression is negative 2) stage IIb (T2, N0, M0) left breast invasive ductal carcinoma involving the upper outer quadrant of the left breast diagnosed in August 2021.  PRIOR THERAPY:  1) Concurrent chemoradiation with weekly carboplatin for AUC of 2 and paclitaxel 45 mg/M2.  Status post 7 cycles with partial response. 2) Consolidation treatment with immunotherapy with Imfinzi 10 mg/KG every 2 weeks.  First dose July 31, 2018.  Status post 26 cycles. 3) status post left lumpectomy followed by adjuvant chemotherapy with Adriamycin, Cytoxan and Taxol  CURRENT THERAPY: Observation.  INTERVAL HISTORY: Deborah [name] 65 y.o. female returns to the clinic today for follow-up visit accompanied by her husband.  The patient was last seen on January 28, 2020.  She was followed since that time by Dr. [name] for her recent diagnosis of stage IIb left breast cancer.  She underwent lumpectomy followed by adjuvant systemic chemotherapy which was complicated with hospitalization and intolerance.  She was lost to follow-up with me since that time because she was busy with many other social issues including the death of her sister and also dealing with selling her house.  She presented today for evaluation and recommendation regarding her condition especially with more development of left-sided chest pain as well as shortness of breath and cough.  She denied having any current fever or chills.  She has no nausea, vomiting, diarrhea or constipation.  She has no headache or visual changes.  She had repeat CT scan of the chest performed recently  and she is here for evaluation and discussion of her scan results.  MEDICAL HISTORY: Past Medical History:  Diagnosis Date   Anemia    as teen   Asthma    Breast cancer ([facility])    left breast cancer 02/2020   CHF (congestive heart failure) (HCC)    COPD, mild ([facility])    Depression    Diverticulitis    Family history of anesthesia complication    vomiting   GI bleeding    Heart failure ([facility])    New onset 07/25/14   Histoplasmosis    left eye   Hyperkalemia    Hypertension    Lung cancer ([facility]) 05/02/2018   s/p chemoradiation, immunotherapy   Obesity (BMI 30-39.9)    Pneumonia    dx wtih pneumonia on 05/27/16- seen by [facility]    PONV (postoperative nausea and vomiting)    Restrictive lung disease    Shortness of breath    with exertion    Sleep apnea    mask and oxygen at nite for sleep at 2L    Tobacco abuse    Umbilical hernia     ALLERGIES:  has No Known Allergies.  MEDICATIONS:  Current Outpatient Medications  Medication Sig Dispense Refill   acetaminophen (TYLENOL) 325 MG tablet Take 650 mg by mouth every 6 (six) hours as needed for headache (pain).     albuterol (VENTOLIN HFA) 108 (90 Base) MCG/ACT inhaler Inhale 2 puffs into the lungs every 6 (six) hours as needed for wheezing or shortness of breath. 8 g 3   aspirin 81 MG chewable  tablet Chew 1 tablet (81 mg total) by mouth daily. 30 tablet 1   Aspirin-Caffeine (BC FAST PAIN RELIEF ARTHRITIS PO) Take 1 packet by mouth daily as needed (back pain).     Budeson-Glycopyrrol-Formoterol (BREZTRI AEROSPHERE) 160-9-4.8 MCG/ACT AERO Inhale 2 puffs into the lungs daily. (Patient taking differently: Inhale 2 puffs into the lungs daily as needed (shortness of breath/wheezing).) 5.9 g 0   diltiazem (CARDIZEM CD) 240 MG 24 hr capsule TAKE 1 CAPSULE BY MOUTH EVERY DAY 30 capsule 6   furosemide (LASIX) 20 MG tablet Take 1 tablet (20 mg total) by mouth daily. 90 tablet 1   ipratropium-albuterol (DUONEB) 0.5-2.5 (3) MG/3ML SOLN Take 3  mLs by nebulization every 3 (three) hours as needed. (Patient taking differently: Take 3 mLs by nebulization every 3 (three) hours as needed (shortness of breath/wheezing).) 360 mL 11   montelukast (SINGULAIR) 10 MG tablet Take 1 tablet (10 mg total) by mouth at bedtime. 90 tablet 2   neomycin-polymyxin b-dexamethasone (MAXITROL) 3.5-10000-0.1 OINT Place into both eyes 2 (two) times daily.     OXYGEN Inhale 4 L/min into the lungs continuous.      potassium chloride SA (KLOR-CON M20) 20 MEQ tablet TAKE 1/2 TABLET BY MOUTH EVERY DAY 45 tablet 2   rosuvastatin (CRESTOR) 5 MG tablet Take 1 tablet (5 mg total) by mouth daily. For cholesterol. 90 tablet 3   Spacer/Aero-Holding Chambers (AEROCHAMBER MV) inhaler Use as instructed 1 each 0   No current facility-administered medications for this visit.    SURGICAL HISTORY:  Past Surgical History:  Procedure Laterality Date   APPLICATION OF WOUND VAC  03/19/2013   Procedure: APPLICATION OF WOUND VAC;  Surgeon: Madilyn Hook, DO;  Location: [facility];  Service: General;;   BREAST LUMPECTOMY WITH RADIOACTIVE SEED AND SENTINEL LYMPH NODE BIOPSY Left 04/08/2020   Procedure: LEFT BREAST LUMPECTOMY WITH RADIOACTIVE SEED AND LEFT AXILLARY SENTINEL LYMPH NODE BIOPSY;  Surgeon: Alphonsa Overall, MD;  Location: [facility];  Service: General;  Laterality: Left;  PEC BLOCK, NEEDS ANESTHESIA CONSULT   CESAREAN SECTION  04/12/85   COLONOSCOPY WITH PROPOFOL N/A 06/13/2016   Procedure: COLONOSCOPY WITH PROPOFOL;  Surgeon: Jerene Bears, MD;  Location: [facility];  Service: Gastroenterology;  Laterality: N/A;   ERCP N/A 02/15/2017   Procedure: ENDOSCOPIC RETROGRADE CHOLANGIOPANCREATOGRAPHY (ERCP);  Surgeon: [name], MD;  Location: [facility];  Service: Endoscopy;  Laterality: N/A;   INSERTION OF MESH N/A 03/19/2013   Procedure: INSERTION OF MESH;  Surgeon: Madilyn Hook, DO;  Location: [facility];  Service: General;  Laterality: N/A;   IR IMAGING GUIDED PORT INSERTION  03/26/2020    IR THORACENTESIS ASP PLEURAL SPACE W/IMG GUIDE  05/25/2020   VENTRAL HERNIA REPAIR N/A 03/19/2013   Procedure:  OPEN VENTRAL HERNIA REPAIR WITH MESH AND APPLICATION OF WOUND VAC;  Surgeon: Madilyn Hook, DO;  Location: [facility];  Service: General;  Laterality: N/A;   VIDEO BRONCHOSCOPY Bilateral 04/19/2018   Procedure: VIDEO BRONCHOSCOPY WITH FLUORO;  Surgeon: Rigoberto Noel, MD;  Location: [facility];  Service: Cardiopulmonary;  Laterality: Bilateral;    REVIEW OF SYSTEMS:  Constitutional: positive for fatigue Eyes: negative Ears, nose, mouth, throat, and face: negative Respiratory: positive for cough and dyspnea on exertion Cardiovascular: negative Gastrointestinal: negative Genitourinary:negative Integument/breast: negative Hematologic/lymphatic: negative Musculoskeletal:negative Neurological: negative Behavioral/Psych: negative Endocrine: negative Allergic/Immunologic: negative   PHYSICAL EXAMINATION: General appearance: alert, cooperative, fatigued, and no distress Head: Normocephalic, without obvious abnormality, atraumatic Neck: no adenopathy, no JVD, supple, symmetrical, trachea  midline, and thyroid not enlarged, symmetric, no tenderness/mass/nodules Lymph nodes: Cervical, supraclavicular, and axillary nodes normal. Resp: diminished breath sounds LLL and dullness to percussion LLL Back: symmetric, no curvature. ROM normal. No CVA tenderness. Cardio: regular rate and rhythm, S1, S2 normal, no murmur, click, rub or gallop GI: soft, non-tender; bowel sounds normal; no masses,  no organomegaly Extremities: extremities normal, atraumatic, no cyanosis or edema Neurologic: Alert and oriented X 3, normal strength and tone. Normal symmetric reflexes. Normal coordination and gait  ECOG PERFORMANCE STATUS: 1 - Symptomatic but completely ambulatory  Blood pressure 140/65, pulse (!) 114, temperature (!) 96.7 F (35.9 C), temperature source Tympanic, resp. rate 18, weight 202 lb 8 oz (91.9  kg), SpO2 94 %.  LABORATORY DATA: Lab Results  Component Value Date   WBC 10.4 09/16/2020   HGB 12.2 09/16/2020   HCT 38.9 09/16/2020   MCV 87.8 09/16/2020   PLT 315 09/16/2020      Chemistry      Component Value Date/Time   NA 142 04/25/2021 1153   NA 143 11/23/2016 1504   K 4.2 04/25/2021 1153   CL 100 04/25/2021 1153   CO2 31 04/25/2021 1153   BUN 15 04/25/2021 1153   BUN 14 11/23/2016 1504   CREATININE 0.77 04/25/2021 1153      Component Value Date/Time   CALCIUM 9.4 04/25/2021 1153   ALKPHOS 99 04/25/2021 1153   AST 14 (L) 04/25/2021 1153   ALT 10 04/25/2021 1153   BILITOT 0.3 04/25/2021 1153       RADIOGRAPHIC STUDIES: CT Chest W Contrast  Result Date: 04/26/2021 CLINICAL DATA:  Non-small cell lung cancer staging EXAM: CT CHEST WITH CONTRAST TECHNIQUE: Multidetector CT imaging of the chest was performed during intravenous contrast administration. CONTRAST:  51mL OMNIPAQUE IOHEXOL 350 MG/ML SOLN COMPARISON:  01/26/2020 FINDINGS: Cardiovascular: Right chest port catheter. Aortic atherosclerosis. Normal heart size. No pericardial effusion. Mediastinum/Nodes: No enlarged mediastinal, hilar, or axillary lymph nodes. Thyroid gland, trachea, and esophagus demonstrate no significant findings. Lungs/Pleura: Moderate left pleural effusion and associated atelectasis or consolidation, new compared to prior examination. New, dense consolidation and fibrosis of the left lower lobe. Tubular bronchiectasis of the right lung base with associated clustered centrilobular and tree-in-bud nodularity (series 5, image 105). Upper Abdomen: No acute abnormality. Musculoskeletal: No chest wall mass or suspicious bone lesions identified. IMPRESSION: 1. Moderate left pleural effusion and associated atelectasis or consolidation, new compared to prior examination, suspicious for pleural metastatic disease although without direct evidence of pleural thickening or nodularity. 2. New, dense consolidation  and fibrosis of the left lower lobe, which may reflect atelectasis, fibrotic post treatment change, and or malignant recurrence. PET-CT may be helpful to assess for residual recurrent metabolically active disease in this portion of the lung. 3. Tubular bronchiectasis of the right lung base with associated clustered centrilobular and tree-in-bud nodularity given distribution most consistent with chronic sequelae of aspiration, similar in appearance to prior examination. Aortic Atherosclerosis (ICD10-I70.0). Electronically Signed   By: Delanna Ahmadi M.D.   On: 04/26/2021 13:55     ASSESSMENT AND PLAN: This is a very pleasant 65 years old white female with stage IIIB non-small cell lung cancer, squamous cell carcinoma. She underwent a course of concurrent chemoradiation with weekly carboplatin and paclitaxel, status post 7 cycles.  She tolerated this treatment well with no concerning adverse effect except for fatigue. She underwent consolidation treatment with immunotherapy with Imfinzi status post 26 cycles. The patient was also diagnosed with a stage IIb  left breast cancer s/p lumpectomy followed by adjuvant systemic chemotherapy with Adriamycin, Cytoxan and Taxol.  She was followed by Dr. Payton Mccallum at that time. She presented today with worsening dyspnea and cough.  She had repeat CT scan of the chest performed recently.  I personally and independently reviewed the scan images and discussed the results with the patient and her husband.  Unfortunately her scan showed some evidence for disease progression/recurrence with moderate left pleural effusion as well as suspicious pleural metastasis. I recommended for the patient to have a PET scan as well as MRI of the brain for complete staging work-up. I will see her back for follow-up visit in 2 weeks for evaluation and discussion of her treatment options based on the PET scan and MRI results. I may consider The patient for left sided thoracentesis and drainage of  the pleural fluid after the PET scan. For the COPD, she will continue her current follow-up evaluation by Dr. Chase Caller. She was advised to call immediately if she has any other concerning symptoms in the interval. The patient voices understanding of current disease status and treatment options and is in agreement with the current care plan.  All questions were answered. The patient knows to call the clinic with any problems, questions or concerns. We can certainly see the patient much sooner if necessary.  Disclaimer: This note was dictated with voice recognition software. Similar sounding words can inadvertently be transcribed and may not be corrected upon review.

## 2021-05-03 ENCOUNTER — Other Ambulatory Visit: Payer: Self-pay | Admitting: Hematology and Oncology

## 2021-05-03 ENCOUNTER — Other Ambulatory Visit: Payer: Self-pay | Admitting: Cardiovascular Disease

## 2021-05-03 DIAGNOSIS — Z171 Estrogen receptor negative status [ER-]: Secondary | ICD-10-CM

## 2021-05-03 DIAGNOSIS — I272 Pulmonary hypertension, unspecified: Secondary | ICD-10-CM

## 2021-05-03 DIAGNOSIS — C3432 Malignant neoplasm of lower lobe, left bronchus or lung: Secondary | ICD-10-CM

## 2021-05-03 DIAGNOSIS — I5032 Chronic diastolic (congestive) heart failure: Secondary | ICD-10-CM

## 2021-05-03 DIAGNOSIS — C50412 Malignant neoplasm of upper-outer quadrant of left female breast: Secondary | ICD-10-CM

## 2021-05-03 NOTE — Telephone Encounter (Signed)
Please contact pt for future appointment. Pt due for 6 month f/u. 

## 2021-05-03 NOTE — Progress Notes (Deleted)
[facility] OFFICE PROGRESS NOTE  Pleas Koch, NP [place] [address]  DIAGNOSIS:  1) stage IIIB (T3, N2, M0) non-small cell lung cancer, squamous cell carcinoma presented with large left lower lobe lung mass in addition to mediastinal lymphadenopathy diagnosed in October 2019. PDL 1 expression is negative 2) stage IIb (T2, N0, M0) left breast invasive ductal carcinoma involving the upper outer quadrant of the left breast diagnosed in August 2021.  PRIOR THERAPY: 1) Concurrent chemoradiation with weekly carboplatin for AUC of 2 and paclitaxel 45 mg/M2.  Status post 7 cycles with partial response. 2) Consolidation treatment with immunotherapy with Imfinzi 10 mg/KG every 2 weeks.  First dose July 31, 2018.  Status post 26 cycles. 3) status post left lumpectomy followed by adjuvant chemotherapy with Adriamycin, Cytoxan and Taxol  CURRENT THERAPY: ***   INTERVAL HISTORY: SHADELL BRENN 65 y.o. female returns      MEDICAL HISTORY: Past Medical History:  Diagnosis Date   Anemia    as teen   Asthma    Breast cancer ([facility])    left breast cancer 02/2020   CHF (congestive heart failure) (H[facility])    COPD, mild ([facility])    Depression    Diverticulitis    Family history of anesthesia complication    vomiting   GI bleeding    Heart failure ([facility])    New onset 07/25/14   Histoplasmosis    left eye   Hyperkalemia    Hypertension    Lung cancer ([facility]) 05/02/2018   s/p chemoradiation, immunotherapy   Obesity (BMI 30-39.9)    Pneumonia    dx wtih pneumonia on 05/27/16- seen by [facility]    PONV (postoperative nausea and vomiting)    Restrictive lung disease    Shortness of breath    with exertion    Sleep apnea    mask and oxygen at nite for sleep at 2L    Tobacco abuse    Umbilical hernia     ALLERGIES:  has No Known Allergies.  MEDICATIONS:  Current Outpatient Medications  Medication Sig Dispense Refill   acetaminophen (TYLENOL) 325 MG  tablet Take 650 mg by mouth every 6 (six) hours as needed for headache (pain).     albuterol (VENTOLIN HFA) 108 (90 Base) MCG/ACT inhaler Inhale 2 puffs into the lungs every 6 (six) hours as needed for wheezing or shortness of breath. 8 g 3   aspirin 81 MG chewable tablet Chew 1 tablet (81 mg total) by mouth daily. 30 tablet 1   Aspirin-Caffeine (BC FAST PAIN RELIEF ARTHRITIS PO) Take 1 packet by mouth daily as needed (back pain).     Budeson-Glycopyrrol-Formoterol (BREZTRI AEROSPHERE) 160-9-4.8 MCG/ACT AERO Inhale 2 puffs into the lungs daily. (Patient taking differently: Inhale 2 puffs into the lungs daily as needed (shortness of breath/wheezing).) 5.9 g 0   diltiazem (CARDIZEM CD) 240 MG 24 hr capsule TAKE 1 CAPSULE BY MOUTH EVERY DAY 30 capsule 6   furosemide (LASIX) 20 MG tablet Take 1 tablet (20 mg total) by mouth daily. 90 tablet 1   ipratropium-albuterol (DUONEB) 0.5-2.5 (3) MG/3ML SOLN Take 3 mLs by nebulization every 3 (three) hours as needed. (Patient taking differently: Take 3 mLs by nebulization every 3 (three) hours as needed (shortness of breath/wheezing).) 360 mL 11   montelukast (SINGULAIR) 10 MG tablet Take 1 tablet (10 mg total) by mouth at bedtime. 90 tablet 2   neomycin-polymyxin b-dexamethasone (MAXITROL) 3.5-10000-0.1 OINT Place  into both eyes 2 (two) times daily.     OXYGEN Inhale 4 L/min into the lungs continuous.      potassium chloride SA (KLOR-CON M20) 20 MEQ tablet TAKE 1/2 TABLET BY MOUTH EVERY DAY 45 tablet 2   rosuvastatin (CRESTOR) 5 MG tablet Take 1 tablet (5 mg total) by mouth daily. For cholesterol. 90 tablet 3   Spacer/Aero-Holding Chambers (AEROCHAMBER MV) inhaler Use as instructed 1 each 0   No current facility-administered medications for this visit.    SURGICAL HISTORY:  Past Surgical History:  Procedure Laterality Date   APPLICATION OF WOUND VAC  03/19/2013   Procedure: APPLICATION OF WOUND VAC;  Surgeon: Madilyn Hook, DO;  Location: [facility];  Service:  General;;   BREAST LUMPECTOMY WITH RADIOACTIVE SEED AND SENTINEL LYMPH NODE BIOPSY Left 04/08/2020   Procedure: LEFT BREAST LUMPECTOMY WITH RADIOACTIVE SEED AND LEFT AXILLARY SENTINEL LYMPH NODE BIOPSY;  Surgeon: Alphonsa Overall, MD;  Location: [facility];  Service: General;  Laterality: Left;  PEC BLOCK, NEEDS ANESTHESIA CONSULT   CESAREAN SECTION  04/12/85   COLONOSCOPY WITH PROPOFOL N/A 06/13/2016   Procedure: COLONOSCOPY WITH PROPOFOL;  Surgeon: Jerene Bears, MD;  Location: [facility];  Service: Gastroenterology;  Laterality: N/A;   ERCP N/A 02/15/2017   Procedure: ENDOSCOPIC RETROGRADE CHOLANGIOPANCREATOGRAPHY (ERCP);  Surgeon: [name], MD;  Location: [facility];  Service: Endoscopy;  Laterality: N/A;   INSERTION OF MESH N/A 03/19/2013   Procedure: INSERTION OF MESH;  Surgeon: Madilyn Hook, DO;  Location: [facility];  Service: General;  Laterality: N/A;   IR IMAGING GUIDED PORT INSERTION  03/26/2020   IR THORACENTESIS ASP PLEURAL SPACE W/IMG GUIDE  05/25/2020   VENTRAL HERNIA REPAIR N/A 03/19/2013   Procedure:  OPEN VENTRAL HERNIA REPAIR WITH MESH AND APPLICATION OF WOUND VAC;  Surgeon: Madilyn Hook, DO;  Location: [facility];  Service: General;  Laterality: N/A;   VIDEO BRONCHOSCOPY Bilateral 04/19/2018   Procedure: VIDEO BRONCHOSCOPY WITH FLUORO;  Surgeon: Rigoberto Noel, MD;  Location: [facility];  Service: Cardiopulmonary;  Laterality: Bilateral;    REVIEW OF SYSTEMS:   Review of Systems  Constitutional: Negative for appetite change, chills, fatigue, fever and unexpected weight change.  HENT:   Negative for mouth sores, nosebleeds, sore throat and trouble swallowing.   Eyes: Negative for eye problems and icterus.  Respiratory: Negative for cough, hemoptysis, shortness of breath and wheezing.   Cardiovascular: Negative for chest pain and leg swelling.  Gastrointestinal: Negative for abdominal pain, constipation, diarrhea, nausea and vomiting.  Genitourinary: Negative for bladder incontinence,  difficulty urinating, dysuria, frequency and hematuria.   Musculoskeletal: Negative for back pain, gait problem, neck pain and neck stiffness.  Skin: Negative for itching and rash.  Neurological: Negative for dizziness, extremity weakness, gait problem, headaches, light-headedness and seizures.  Hematological: Negative for adenopathy. Does not bruise/bleed easily.  Psychiatric/Behavioral: Negative for confusion, depression and sleep disturbance. The patient is not nervous/anxious.     PHYSICAL EXAMINATION:  There were no vitals taken for this visit.  ECOG PERFORMANCE STATUS: {CHL ONC ECOG [id number]  Physical Exam  Constitutional: Oriented to person, place, and time and well-developed, well-nourished, and in no distress. No distress.  HENT:  Head: Normocephalic and atraumatic.  Mouth/Throat: Oropharynx is clear and moist. No oropharyngeal exudate.  Eyes: Conjunctivae are normal. Right eye exhibits no discharge. Left eye exhibits no discharge. No scleral icterus.  Neck: Normal range of motion. Neck supple.  Cardiovascular: Normal rate, regular rhythm, normal heart sounds and intact  distal pulses.   Pulmonary/Chest: Effort normal and breath sounds normal. No respiratory distress. No wheezes. No rales.  Abdominal: Soft. Bowel sounds are normal. Exhibits no distension and no mass. There is no tenderness.  Musculoskeletal: Normal range of motion. Exhibits no edema.  Lymphadenopathy:    No cervical adenopathy.  Neurological: Alert and oriented to person, place, and time. Exhibits normal muscle tone. Gait normal. Coordination normal.  Skin: Skin is warm and dry. No rash noted. Not diaphoretic. No erythema. No pallor.  Psychiatric: Mood, memory and judgment normal.  Vitals reviewed.  LABORATORY DATA: Lab Results  Component Value Date   WBC 10.4 09/16/2020   HGB 12.2 09/16/2020   HCT 38.9 09/16/2020   MCV 87.8 09/16/2020   PLT 315 09/16/2020      Chemistry      Component Value  Date/Time   NA 142 04/25/2021 1153   NA 143 11/23/2016 1504   K 4.2 04/25/2021 1153   CL 100 04/25/2021 1153   CO2 31 04/25/2021 1153   BUN 15 04/25/2021 1153   BUN 14 11/23/2016 1504   CREATININE 0.77 04/25/2021 1153      Component Value Date/Time   CALCIUM 9.4 04/25/2021 1153   ALKPHOS 99 04/25/2021 1153   AST 14 (L) 04/25/2021 1153   ALT 10 04/25/2021 1153   BILITOT 0.3 04/25/2021 1153       RADIOGRAPHIC STUDIES:  CT Chest W Contrast  Result Date: 04/26/2021 CLINICAL DATA:  Non-small cell lung cancer staging EXAM: CT CHEST WITH CONTRAST TECHNIQUE: Multidetector CT imaging of the chest was performed during intravenous contrast administration. CONTRAST:  19mL OMNIPAQUE IOHEXOL 350 MG/ML SOLN COMPARISON:  01/26/2020 FINDINGS: Cardiovascular: Right chest port catheter. Aortic atherosclerosis. Normal heart size. No pericardial effusion. Mediastinum/Nodes: No enlarged mediastinal, hilar, or axillary lymph nodes. Thyroid gland, trachea, and esophagus demonstrate no significant findings. Lungs/Pleura: Moderate left pleural effusion and associated atelectasis or consolidation, new compared to prior examination. New, dense consolidation and fibrosis of the left lower lobe. Tubular bronchiectasis of the right lung base with associated clustered centrilobular and tree-in-bud nodularity (series 5, image 105). Upper Abdomen: No acute abnormality. Musculoskeletal: No chest wall mass or suspicious bone lesions identified. IMPRESSION: 1. Moderate left pleural effusion and associated atelectasis or consolidation, new compared to prior examination, suspicious for pleural metastatic disease although without direct evidence of pleural thickening or nodularity. 2. New, dense consolidation and fibrosis of the left lower lobe, which may reflect atelectasis, fibrotic post treatment change, and or malignant recurrence. PET-CT may be helpful to assess for residual recurrent metabolically active disease in this  portion of the lung. 3. Tubular bronchiectasis of the right lung base with associated clustered centrilobular and tree-in-bud nodularity given distribution most consistent with chronic sequelae of aspiration, similar in appearance to prior examination. Aortic Atherosclerosis (ICD10-I70.0). Electronically Signed   By: Delanna Ahmadi M.D.   On: 04/26/2021 13:55     ASSESSMENT/PLAN:  No problem-specific Assessment & Plan notes found for this encounter.   No orders of the defined types were placed in this encounter.    I spent {CHL ONC TIME VISIT - DJSHF:[id number]} counseling the patient face to face. The total time spent in the appointment was {CHL ONC TIME VISIT - OYDXA:[id number]}.  Royalty Fakhouri L Anan Dapolito, PA-C 05/03/21

## 2021-05-09 ENCOUNTER — Other Ambulatory Visit: Payer: Self-pay

## 2021-05-09 ENCOUNTER — Ambulatory Visit (HOSPITAL_COMMUNITY)
Admission: RE | Admit: 2021-05-09 | Discharge: 2021-05-09 | Disposition: A | Discharge status: Home / Self Care | Payer: BLUE CROSS/BLUE SHIELD | Source: Ambulatory Visit | Attending: Internal Medicine | Admitting: Internal Medicine

## 2021-05-09 DIAGNOSIS — C3490 Malignant neoplasm of unspecified part of unspecified bronchus or lung: Secondary | ICD-10-CM | POA: Diagnosis not present

## 2021-05-09 DIAGNOSIS — Z853 Personal history of malignant neoplasm of breast: Secondary | ICD-10-CM | POA: Diagnosis not present

## 2021-05-09 DIAGNOSIS — I6782 Cerebral ischemia: Secondary | ICD-10-CM | POA: Diagnosis not present

## 2021-05-09 DIAGNOSIS — H748X3 Other specified disorders of middle ear and mastoid, bilateral: Secondary | ICD-10-CM | POA: Diagnosis not present

## 2021-05-09 MED ORDER — GADOBUTROL 1 MMOL/ML IV SOLN
10.0000 mL | Freq: Once | INTRAVENOUS | Status: AC | PRN
  Administered 2021-05-09: 10 mL via INTRAVENOUS

## 2021-05-11 ENCOUNTER — Ambulatory Visit: Payer: BLUE CROSS/BLUE SHIELD | Admitting: Physician Assistant

## 2021-05-13 ENCOUNTER — Encounter: Payer: Self-pay | Admitting: Physician Assistant

## 2021-05-13 ENCOUNTER — Ambulatory Visit: Payer: BLUE CROSS/BLUE SHIELD

## 2021-05-13 ENCOUNTER — Other Ambulatory Visit: Payer: Self-pay

## 2021-05-13 ENCOUNTER — Encounter (HOSPITAL_COMMUNITY)
Admission: RE | Admit: 2021-05-13 | Discharge: 2021-05-13 | Disposition: A | Discharge status: Home / Self Care | Payer: BLUE CROSS/BLUE SHIELD | Source: Ambulatory Visit | Attending: Internal Medicine | Admitting: Internal Medicine

## 2021-05-13 ENCOUNTER — Ambulatory Visit: Payer: BLUE CROSS/BLUE SHIELD | Admitting: Physician Assistant

## 2021-05-13 VITALS — BP 104/70 | HR 120 | Ht 62.0 in | Wt 202.0 lb

## 2021-05-13 DIAGNOSIS — R000 Tachycardia, unspecified: Secondary | ICD-10-CM | POA: Diagnosis not present

## 2021-05-13 DIAGNOSIS — I10 Essential (primary) hypertension: Secondary | ICD-10-CM | POA: Diagnosis not present

## 2021-05-13 DIAGNOSIS — C3491 Malignant neoplasm of unspecified part of right bronchus or lung: Secondary | ICD-10-CM | POA: Diagnosis not present

## 2021-05-13 DIAGNOSIS — C3490 Malignant neoplasm of unspecified part of unspecified bronchus or lung: Secondary | ICD-10-CM | POA: Insufficient documentation

## 2021-05-13 DIAGNOSIS — C50412 Malignant neoplasm of upper-outer quadrant of left female breast: Secondary | ICD-10-CM | POA: Diagnosis not present

## 2021-05-13 DIAGNOSIS — I5032 Chronic diastolic (congestive) heart failure: Secondary | ICD-10-CM

## 2021-05-13 DIAGNOSIS — Z171 Estrogen receptor negative status [ER-]: Secondary | ICD-10-CM

## 2021-05-13 LAB — GLUCOSE, CAPILLARY: Glucose-Capillary: 89 mg/dL (ref 70–99)

## 2021-05-13 MED ORDER — FLUDEOXYGLUCOSE F - 18 (FDG) INJECTION
10.0000 | Freq: Once | INTRAVENOUS | Status: AC
  Administered 2021-05-13: 10.1 via INTRAVENOUS

## 2021-05-13 NOTE — Progress Notes (Signed)
[facility] OFFICE PROGRESS NOTE  Pleas Koch, NP [place] [address]  DIAGNOSIS:  1) stage IIIB (T3, N2, M0) non-small cell lung cancer, squamous cell carcinoma presented with large left lower lobe lung mass in addition to mediastinal lymphadenopathy diagnosed in October 2019. PDL 1 expression is negative 2) stage IIb (T2, N0, M0) left breast invasive ductal carcinoma involving the upper outer quadrant of the left breast diagnosed in August 2021.  PRIOR THERAPY: 1) Concurrent chemoradiation with weekly carboplatin for AUC of 2 and paclitaxel 45 mg/M2.  Status post 7 cycles with partial response. 2) Consolidation treatment with immunotherapy with Imfinzi 10 mg/KG every 2 weeks.  First dose July 31, 2018.  Status post 26 cycles. 3) status post left lumpectomy followed by adjuvant chemotherapy with Adriamycin, Cytoxan and Taxol under the care of Dr. [name]   CURRENT THERAPY: Observation  INTERVAL HISTORY: Deborah [name] 65 y.o. female returns to clinic today for a follow-up visit accompanied by her husband.  She was last seen in the clinic on 04/28/2021.  The patient was lost to follow-up for her lung cancer because in the interval was diagnosed with stage IIb left-sided breast cancer for which she underwent lumpectomy and adjuvant chemotherapy.  Her treatment was complicated by hospitalization and intolerance.   She had a repeat CT scan in October 2022 which showed concern for recurrent lung cancer with a moderate size left pleural effusion and pleural-based metastatic disease.  Dr. Julien Nordmann recommended repeating the staging work-up with a brain MRI and a PET scan to further evaluate her condition.  Overall, the patient is feeling fair today.  She does not have any new concerns today. She continues to have chronic dyspnea on exertion for which she is on supplemental oxygen 4 L.  She denies any pain today in the chest or abdomen. She reports her  baseline cough which she has "always" had.  She has quit smoking previously.  She has a mild self limiting headaches which resolve without intervention. She sometimes with take tylenol infrequently if needed which helps her pain. She denies any nausea, vomiting, diarrhea, or constipation.  She is here today for evaluation and to review her scan results and for more detailed discussion about her current condition and recommended treatment options.   MEDICAL HISTORY: Past Medical History:  Diagnosis Date   Anemia    as teen   Asthma    Breast cancer ([facility])    left breast cancer 02/2020   CHF (congestive heart failure) (HCC)    COPD, mild (HCC)    Depression    Diverticulitis    Family history of anesthesia complication    vomiting   GI bleeding    Heart failure ([facility])    New onset 07/25/14   Histoplasmosis    left eye   Hyperkalemia    Hypertension    Lung cancer ([facility]) 05/02/2018   s/p chemoradiation, immunotherapy   Obesity (BMI 30-39.9)    Pneumonia    dx wtih pneumonia on 05/27/16- seen by [facility]    PONV (postoperative nausea and vomiting)    Restrictive lung disease    Shortness of breath    with exertion    Sleep apnea    mask and oxygen at nite for sleep at 2L    Tobacco abuse    Umbilical hernia     ALLERGIES:  has No Known Allergies.  MEDICATIONS:  Current Outpatient Medications  Medication Sig Dispense Refill  acetaminophen (TYLENOL) 325 MG tablet Take 650 mg by mouth every 6 (six) hours as needed for headache (pain).     albuterol (VENTOLIN HFA) 108 (90 Base) MCG/ACT inhaler Inhale 2 puffs into the lungs every 6 (six) hours as needed for wheezing or shortness of breath. 8 g 3   aspirin 81 MG chewable tablet Chew 1 tablet (81 mg total) by mouth daily. 30 tablet 1   Budeson-Glycopyrrol-Formoterol (BREZTRI AEROSPHERE) 160-9-4.8 MCG/ACT AERO Inhale 2 puffs into the lungs daily. (Patient taking differently: Inhale 2 puffs into the lungs daily as needed (shortness of  breath/wheezing).) 5.9 g 0   diltiazem (CARDIZEM CD) 240 MG 24 hr capsule TAKE 1 CAPSULE BY MOUTH EVERY DAY 30 capsule 6   furosemide (LASIX) 20 MG tablet Take 1 tablet (20 mg total) by mouth daily. Pt needs to keep upcoming appt in Oct for further refills 30 tablet 0   ipratropium-albuterol (DUONEB) 0.5-2.5 (3) MG/3ML SOLN Take 3 mLs by nebulization every 3 (three) hours as needed. (Patient taking differently: Take 3 mLs by nebulization every 3 (three) hours as needed (shortness of breath/wheezing).) 360 mL 11   lidocaine-prilocaine (EMLA) cream APPLY TO AFFECTED AREA ONCE AS DIRECTED 30 g 3   montelukast (SINGULAIR) 10 MG tablet Take 1 tablet (10 mg total) by mouth at bedtime. 90 tablet 2   neomycin-polymyxin b-dexamethasone (MAXITROL) 3.5-10000-0.1 OINT Place into both eyes 2 (two) times daily.     OXYGEN Inhale 4 L/min into the lungs continuous.      potassium chloride SA (KLOR-CON M20) 20 MEQ tablet TAKE 1/2 TABLET BY MOUTH EVERY DAY 45 tablet 2   rosuvastatin (CRESTOR) 5 MG tablet Take 1 tablet (5 mg total) by mouth daily. For cholesterol. 90 tablet 3   Spacer/Aero-Holding Chambers (AEROCHAMBER MV) inhaler Use as instructed 1 each 0   No current facility-administered medications for this visit.    SURGICAL HISTORY:  Past Surgical History:  Procedure Laterality Date   APPLICATION OF WOUND VAC  03/19/2013   Procedure: APPLICATION OF WOUND VAC;  Surgeon: Madilyn Hook, DO;  Location: [facility];  Service: General;;   BREAST LUMPECTOMY WITH RADIOACTIVE SEED AND SENTINEL LYMPH NODE BIOPSY Left 04/08/2020   Procedure: LEFT BREAST LUMPECTOMY WITH RADIOACTIVE SEED AND LEFT AXILLARY SENTINEL LYMPH NODE BIOPSY;  Surgeon: Alphonsa Overall, MD;  Location: [facility];  Service: General;  Laterality: Left;  PEC BLOCK, NEEDS ANESTHESIA CONSULT   CESAREAN SECTION  04/12/85   COLONOSCOPY WITH PROPOFOL N/A 06/13/2016   Procedure: COLONOSCOPY WITH PROPOFOL;  Surgeon: Jerene Bears, MD;  Location: [facility];  Service:  Gastroenterology;  Laterality: N/A;   ERCP N/A 02/15/2017   Procedure: ENDOSCOPIC RETROGRADE CHOLANGIOPANCREATOGRAPHY (ERCP);  Surgeon: [name], MD;  Location: [facility];  Service: Endoscopy;  Laterality: N/A;   INSERTION OF MESH N/A 03/19/2013   Procedure: INSERTION OF MESH;  Surgeon: Madilyn Hook, DO;  Location: [facility];  Service: General;  Laterality: N/A;   IR IMAGING GUIDED PORT INSERTION  03/26/2020   IR THORACENTESIS ASP PLEURAL SPACE W/IMG GUIDE  05/25/2020   VENTRAL HERNIA REPAIR N/A 03/19/2013   Procedure:  OPEN VENTRAL HERNIA REPAIR WITH MESH AND APPLICATION OF WOUND VAC;  Surgeon: Madilyn Hook, DO;  Location: [facility];  Service: General;  Laterality: N/A;   VIDEO BRONCHOSCOPY Bilateral 04/19/2018   Procedure: VIDEO BRONCHOSCOPY WITH FLUORO;  Surgeon: Rigoberto Noel, MD;  Location: [facility];  Service: Cardiopulmonary;  Laterality: Bilateral;    REVIEW OF SYSTEMS:   Review  of Systems  Constitutional: Positive for fatigue. Negative for appetite change, chills, fever and unexpected weight change.  HENT: Negative for mouth sores, nosebleeds, sore throat and trouble swallowing.   Eyes: Negative for eye problems and icterus.  Respiratory:  Positive for baseline/chronic shortness of breath and cough. Negative for hemoptysis and wheezing.   Cardiovascular: Negative for chest pain and leg swelling.  Gastrointestinal: Negative for abdominal pain, constipation, diarrhea, nausea and vomiting.  Genitourinary: Negative for bladder incontinence, difficulty urinating, dysuria, frequency and hematuria.   Musculoskeletal: Negative for back pain, gait problem, neck pain and neck stiffness.  Skin: Positive for dry skin. Negative for itching Neurological: Negative for dizziness, extremity weakness, gait problem, headaches, light-headedness and seizures.  Hematological: Negative for adenopathy. Does not bruise/bleed easily.  Psychiatric/Behavioral: Negative for confusion, depression and sleep  disturbance. The patient is not nervous/anxious.   PHYSICAL EXAMINATION:  Blood pressure 110/72, pulse 96, temperature 99.1 F (37.3 C), temperature source Temporal, resp. rate 19, height 5' 2"  (1.575 m), weight 203 lb 9.6 oz (92.4 kg), SpO2 100 %.  ECOG PERFORMANCE STATUS: 1-2  Physical Exam  Constitutional: Oriented to person, place, and time and well-developed, well-nourished, and in no distress. On oxygen via nasal cannula HENT:  Head: Normocephalic and atraumatic.  Mouth/Throat: Oropharynx is clear and moist. No oropharyngeal exudate.  Eyes: Conjunctivae are normal. Right eye exhibits no discharge. Left eye exhibits no discharge. No scleral icterus.  Neck: Normal range of motion. Neck supple.  Cardiovascular: Normal rate, regular rhythm, normal heart sounds and intact distal pulses.   Pulmonary/Chest: Effort normal. Decreased breath sounds in all lung fields. No respiratory distress. No wheezes. No rales.  On 2 L of supplemental oxygen via nasal cannula. Abdominal: Soft. Bowel sounds are normal. Exhibits no distension and no mass. There is no tenderness.  Musculoskeletal: Normal range of motion. Exhibits no edema.  Lymphadenopathy:    No cervical adenopathy.  Neurological: Alert and oriented to person, place, and time. Exhibits normal muscle tone. Gait normal. Coordination normal.  Skin: Skin is warm and dry. No rash noted. Not diaphoretic. No erythema. No pallor.  Psychiatric: Mood, memory and judgment normal.  Vitals reviewed.  LABORATORY DATA: Lab Results  Component Value Date   WBC 10.4 09/16/2020   HGB 12.2 09/16/2020   HCT 38.9 09/16/2020   MCV 87.8 09/16/2020   PLT 315 09/16/2020      Chemistry      Component Value Date/Time   NA 142 04/25/2021 1153   NA 143 11/23/2016 1504   K 4.2 04/25/2021 1153   CL 100 04/25/2021 1153   CO2 31 04/25/2021 1153   BUN 15 04/25/2021 1153   BUN 14 11/23/2016 1504   CREATININE 0.77 04/25/2021 1153      Component Value  Date/Time   CALCIUM 9.4 04/25/2021 1153   ALKPHOS 99 04/25/2021 1153   AST 14 (L) 04/25/2021 1153   ALT 10 04/25/2021 1153   BILITOT 0.3 04/25/2021 1153       RADIOGRAPHIC STUDIES:  CT Chest W Contrast  Result Date: 04/26/2021 CLINICAL DATA:  Non-small cell lung cancer staging EXAM: CT CHEST WITH CONTRAST TECHNIQUE: Multidetector CT imaging of the chest was performed during intravenous contrast administration. CONTRAST:  66m OMNIPAQUE IOHEXOL 350 MG/ML SOLN COMPARISON:  01/26/2020 FINDINGS: Cardiovascular: Right chest port catheter. Aortic atherosclerosis. Normal heart size. No pericardial effusion. Mediastinum/Nodes: No enlarged mediastinal, hilar, or axillary lymph nodes. Thyroid gland, trachea, and esophagus demonstrate no significant findings. Lungs/Pleura: Moderate left pleural effusion and associated  atelectasis or consolidation, new compared to prior examination. New, dense consolidation and fibrosis of the left lower lobe. Tubular bronchiectasis of the right lung base with associated clustered centrilobular and tree-in-bud nodularity (series 5, image 105). Upper Abdomen: No acute abnormality. Musculoskeletal: No chest wall mass or suspicious bone lesions identified. IMPRESSION: 1. Moderate left pleural effusion and associated atelectasis or consolidation, new compared to prior examination, suspicious for pleural metastatic disease although without direct evidence of pleural thickening or nodularity. 2. New, dense consolidation and fibrosis of the left lower lobe, which may reflect atelectasis, fibrotic post treatment change, and or malignant recurrence. PET-CT may be helpful to assess for residual recurrent metabolically active disease in this portion of the lung. 3. Tubular bronchiectasis of the right lung base with associated clustered centrilobular and tree-in-bud nodularity given distribution most consistent with chronic sequelae of aspiration, similar in appearance to prior examination.  Aortic Atherosclerosis (ICD10-I70.0). Electronically Signed   By: Delanna Ahmadi M.D.   On: 04/26/2021 13:55   MR BRAIN W WO CONTRAST  Result Date: 05/11/2021 CLINICAL DATA:  Non-small cell lung cancer, staging. History of breast cancer. EXAM: MRI HEAD WITHOUT AND WITH CONTRAST TECHNIQUE: Multiplanar, multiecho pulse sequences of the brain and surrounding structures were obtained without and with intravenous contrast. CONTRAST:  80m GADAVIST GADOBUTROL 1 MMOL/ML IV SOLN COMPARISON:  MRI of the brain May 06, 2018 FINDINGS: Brain: No acute infarction, hemorrhage, hydrocephalus, extra-axial collection or mass lesion. Scattered and confluent foci of T2 hyperintensity are seen within the white matter of the cerebral hemispheres, nonspecific, most likely related to chronic small vessel ischemia. Mild parenchymal volume loss. No focus of abnormal contrast enhancement to suggest intracranial metastatic disease. Incidentally noted partial empty sella. Vascular: Normal flow voids. Skull and upper cervical spine: Normal marrow signal. Sinuses/Orbits: Negative. Other: Bilateral mastoid effusion. IMPRESSION: 1. No evidence of intracranial metastatic disease. 2. Mild-to-moderate chronic microvascular ischemic changes of the white matter. 3. Bilateral mastoid effusion. Electronically Signed   By: KPedro EarlsM.D.   On: 05/11/2021 14:16   NM PET Image Restage (PS) Skull Base to Thigh (F-18 FDG)  Result Date: 05/16/2021 CLINICAL DATA:  Subsequent treatment strategy for lung cancer. EXAM: NUCLEAR MEDICINE PET SKULL BASE TO THIGH TECHNIQUE: 10.1 mCi F-18 FDG was injected intravenously. Full-ring PET imaging was performed from the skull base to thigh after the radiotracer. CT data was obtained and used for attenuation correction and anatomic localization. Fasting blood glucose: 89 mg/dl COMPARISON:  Multiple exams, including chest CT 04/25/2021 and PET-CT from 04/18/2018 FINDINGS: Mediastinal blood pool  activity: SUV max 2.6 Liver activity: SUV max NA NECK: No significant abnormal hypermetabolic activity in this region. Incidental CT findings: Bilateral common carotid atherosclerotic calcification. CHEST: Indistinct accentuated activity along the left atrial appendage with maximum SUV of 5.2. The previously seen hypermetabolic AP window lymph node is no longer well appreciated. Substantial left lower lobe atelectasis with some air bronchograms in the superior segment, but maximum SUV in this region is only about 2.9 compared to the prior 04/18/2018 PET-CT where there was a hypermetabolic mass in this vicinity with SUV of 28.4. No obvious hypermetabolic component of this atelectatic left lower lobe is identified. There is substantial airway thickening in the left hilum causing narrowing of both left upper lobe and left lower lobe airways. The left pleural effusion has only low-grade activity, maximum SUV about 1.9, without obvious foci of higher metabolic activity. This may well be exudative but is not specific for malignancy. Incidental CT  findings: Centrilobular emphysema calcified right infrahilar lymph node. Right Port-A-Cath tip: Right atrium. Coronary, aortic arch, and branch vessel atherosclerotic vascular disease. Suspected postoperative findings in the left breast. ABDOMEN/PELVIS: Contracted gallbladder with nonspecific accentuated activity along potentially thickened cystic duct/gallbladder, maximum SUV 5.3. Punctate foci of accentuated activity in the subcutaneous tissues along the right anterior abdominal wall likely represent injection granulomas, 1 of these measures 1.2 by 1.0 cm on image 160 of series 4 maximum SUV 3.0. Incidental CT findings: Punctate calcifications in the spleen compatible with old granulomatous disease. Atherosclerosis is present, including aortoiliac atherosclerotic disease. Colonic diverticulosis. SKELETON: No significant abnormal hypermetabolic activity in this region.  Incidental CT findings: Degenerative glenohumeral arthropathy, left greater than right. IMPRESSION: 1. Continued and likely chronic atelectasis in the left lower lobe and a part of the left upper lobe, with activity in the superior segment left lower currently with a maximum SUV of 2.9 (formerly 28.4). The appearance favors a previously treated lesion, without definite foci of active malignancy. 2. Small amount of accentuated metabolic activity along the left atrial appendage with maximum SUV of 5.2, significance uncertain but I do not see a definite lymph node in this region. 3. Airway thickening along the left hilum causing narrowing of left upper lobe and left lower lobe bronchi. 4. Low-grade activity in the left pleural effusion, possibly exudate of but without obvious foci of higher metabolic activity to indicate definite tumor deposition. 5. Mildly accentuated metabolic activity in the vicinity of the gallbladder/cystic duct, with an appearance of contracted gallbladder. Maximum SUV in this region is 5.3. Significance uncertain, this could be further worked up with MRCP if clinically warranted. 6. Other imaging findings of potential clinical significance: Aortic Atherosclerosis (ICD10-I70.0) and Emphysema (ICD10-J43.9). Coronary atherosclerosis. Colonic diverticulosis. Electronically Signed   By: Van Clines M.D.   On: 05/16/2021 15:02     ASSESSMENT/PLAN:  This is a very pleasant 65 year old Caucasian female initially diagnosed as a stage IIIb non-small cell lung cancer, squamous cell carcinoma.  She initially presented with large left lower lobe lung mass in addition to mediastinal lymphadenopathy diagnosed in October 2019.  Her PD-L1 expression is negative.    She also was diagnosed with stage IIb left-sided breast cancer status postlumpectomy and adjuvant systemic chemotherapy with Adriamycin, Cytoxan, and paclitaxel.  She is followed by Dr. [name].   Regarding her lung cancer, she is status  post a course of concurrent chemoradiation with carboplatin and paclitaxel status post 7 cycles.  She tolerated this treatment well without any concerning adverse side effects except for fatigue.  She also underwent consolidation immunotherapy with Imfinzi IV every 2 weeks.  She status post 26 cycles.    Imaging in October 2022 showed a moderate sized pleural effusion and suspicious pleural metastases.  The patient recently had a PET scan as well as a brain MRI performed to complete the staging work-up.  The patient was seen with Dr. Julien Nordmann today.  Dr. Julien Nordmann personally and independently reviewed the scan.  The scan did not show any suspicious activity for recurrent lung cancer.  Dr. Julien Nordmann recommends that the patient continue on observation with a restaging CT scan of the chest in 6 months.  I will arrange for flush appointments for her Port-A-Cath every 8 weeks.  We will see her back for follow-up visit in 6 months for evaluation and to review her imaging studies.  The patient was advised to call immediately if she has any concerning symptoms in the interval. The patient voices understanding of  current disease status and treatment options and is in agreement with the current care plan. All questions were answered. The patient knows to call the clinic with any problems, questions or concerns. We can certainly see the patient much sooner if necessary        Orders Placed This Encounter  Procedures   CT Chest W Contrast    Standing Status:   Future    Standing Expiration Date:   05/17/2022    Order Specific Question:   If indicated for the ordered procedure, I authorize the administration of contrast media per Radiology protocol    Answer:   Yes    Order Specific Question:   Preferred imaging location?    Answer:   [facility]   CBC with Differential ([facility] Only)    Standing Status:   Future    Standing Expiration Date:   05/17/2022   CMP ([facility] only)     Standing Status:   Future    Standing Expiration Date:   05/17/2022      Tobe Sos Sidnie Swalley, PA-C 05/17/21  ADDENDUM: Hematology/Oncology Attending: I had a face-to-face encounter with the patient today.  I reviewed her record, lab, scan and recommended her care plan.  This is a very pleasant 65 years old white female with history of stage IIIb non-small cell lung cancer, squamous cell carcinoma diagnosed in October 2019 status post a course of concurrent chemoradiation followed by consolidation chemotherapy for 1 year and has been on observation since that time.  The patient was also diagnosed with a stage IIb left sided breast cancer s/p lumpectomy with adjuvant systemic chemotherapy with Adriamycin, Cytoxan and paclitaxel under the care of Dr. [name]. She has been on observation since that time.  She has pain on the left side of the chest as well as worsening shortness of breath and recent CT scan of the chest showed concerning pleural findings on the left hemothorax. I ordered a PET scan which was performed recently.  I personally and independently reviewed the scan images and discussed the results with the patient and her husband. Her PET scan showed no clear evidence for disease recurrence or progression and the suspicious pleural-based nodules as well as the left pleural effusion has low hypermetabolic activity. I recommended for the patient to continue on observation with repeat CT scan of the chest in 6 months. For the history of breast cancer, she will continue her routine follow-up visit and evaluation by Dr. Payton Mccallum. The patient was advised to call immediately if she has any other concerning symptoms in the interval.  The total time spent in the appointment was 30 minutes. Disclaimer: This note was dictated with voice recognition software. Similar sounding words can inadvertently be transcribed and may be missed upon review. Eilleen Kempf, MD 05/17/21

## 2021-05-13 NOTE — Progress Notes (Signed)
Office Visit    Patient Name: Deborah [name] Date of Encounter: 05/13/2021  PCP:  Pleas Koch, NP   [place]  Cardiologist:  Kathlyn Sacramento, MD  Advanced Practice Provider:  No care team member to display Electrophysiologist:  None :[phone]}   Chief Complaint    Chief Complaint  Patient presents with   Follow-up    Follow up and medications verbally reviewed with patient.     65 y.o. female with history of chronic diastolic heart failure, chronic right heel heart failure due to COPD, previous tobacco use with COPD, hypertension, obesity, bipolar disorder, and here today for follow-up.  Past Medical History    Past Medical History:  Diagnosis Date   Anemia    as teen   Asthma    Breast cancer ([place])    left breast cancer 02/2020   CHF (congestive heart failure) (HCC)    COPD, mild (HCC)    Depression    Diverticulitis    Family history of anesthesia complication    vomiting   GI bleeding    Heart failure ([place])    New onset 07/25/14   Histoplasmosis    left eye   Hyperkalemia    Hypertension    Lung cancer ([place]) 05/02/2018   s/p chemoradiation, immunotherapy   Obesity (BMI 30-39.9)    Pneumonia    dx wtih pneumonia on 05/27/16- seen by [facility]    PONV (postoperative nausea and vomiting)    Restrictive lung disease    Shortness of breath    with exertion    Sleep apnea    mask and oxygen at nite for sleep at 2L    Tobacco abuse    Umbilical hernia    Past Surgical History:  Procedure Laterality Date   APPLICATION OF WOUND VAC  03/19/2013   Procedure: APPLICATION OF WOUND VAC;  Surgeon: Madilyn Hook, DO;  Location: [facility];  Service: General;;   BREAST LUMPECTOMY WITH RADIOACTIVE SEED AND SENTINEL LYMPH NODE BIOPSY Left 04/08/2020   Procedure: LEFT BREAST LUMPECTOMY WITH RADIOACTIVE SEED AND LEFT AXILLARY SENTINEL LYMPH NODE BIOPSY;  Surgeon: Alphonsa Overall, MD;  Location: [facility];  Service: General;  Laterality: Left;  PEC  BLOCK, NEEDS ANESTHESIA CONSULT   CESAREAN SECTION  04/12/85   COLONOSCOPY WITH PROPOFOL N/A 06/13/2016   Procedure: COLONOSCOPY WITH PROPOFOL;  Surgeon: Jerene Bears, MD;  Location: [facility];  Service: Gastroenterology;  Laterality: N/A;   ERCP N/A 02/15/2017   Procedure: ENDOSCOPIC RETROGRADE CHOLANGIOPANCREATOGRAPHY (ERCP);  Surgeon: [name], MD;  Location: [facility];  Service: Endoscopy;  Laterality: N/A;   INSERTION OF MESH N/A 03/19/2013   Procedure: INSERTION OF MESH;  Surgeon: Madilyn Hook, DO;  Location: [facility];  Service: General;  Laterality: N/A;   IR IMAGING GUIDED PORT INSERTION  03/26/2020   IR THORACENTESIS ASP PLEURAL SPACE W/IMG GUIDE  05/25/2020   VENTRAL HERNIA REPAIR N/A 03/19/2013   Procedure:  OPEN VENTRAL HERNIA REPAIR WITH MESH AND APPLICATION OF WOUND VAC;  Surgeon: Madilyn Hook, DO;  Location: [facility];  Service: General;  Laterality: N/A;   VIDEO BRONCHOSCOPY Bilateral 04/19/2018   Procedure: VIDEO BRONCHOSCOPY WITH FLUORO;  Surgeon: Rigoberto Noel, MD;  Location: [facility];  Service: Cardiopulmonary;  Laterality: Bilateral;    Allergies  No Known Allergies  History of Present Illness    Deborah [name] is a 65 y.o. female with PMH as above.  She has history of chronic  diastolic heart failure, chronic right heart failure due to COPD, previous tobacco use, hypertension, obesity, and bipolar disorder.  She was admitted 07/2014 for CHF.  She had a moderate left pleural effusion.  Echo showed EF 50 to 55%, G1 DD, dilated with RV with hypokinesis and mild pulmonary hypertension.  VQ scan showed low probability for pulmonary embolism.  She subsequently quit smoking.    She was diagnosed with squamous cell carcinoma of the lung, stage IIIb 07/2017.  She had a large left lower lobe mass in addition to mediastinal lymphadenopathy.  She was treated with radiation, chemotherapy, and immunotherapy.  She was last seen in office 12/16/2019.  She was doing reasonably well  from a cardiac standpoint.  She was mildly tachycardic.  She reported worsening dyspnea, possibly due to underlying lung disease.  It was noted cardiomyopathy should be ruled out with echo requested.  BP well controlled on diltiazem.  Subsequent echo showed EF 55 to 60%, NR WMA, indeterminate diastolic filling due to EA fusion, RVSP 35.0 mmHg.   She was seen by Tivis Ringer fourth, PA-C 08/27/2020 and reported that she was diagnosed with breast cancer by chest CT 01/2020 and had a lumpectomy 04/2020.  She had 1 treatment of chemotherapy but did not tolerate it.  She reported generalized weakness.  She normally ambulated with a cane or walker but was present in a wheelchair.  She reported unchanged shortness of breath.  She was on 4 L nasal cannula oxygen at time.  She reported rapid heart rate up into the 120s.  She was seeing pulmonology every 6 months.  She reported low appetite.  Today, 05/13/2021, she returns to clinic and notes that she is doing about the same as her previous clinic visit.  No chest pain.  She reports her breathing is stable from previous clinic visits.  She is still experiencing dyspnea.  She reports some racing heart rate that mainly occurs when she is moving around.  She occasionally experiences presyncope but denies any syncope.  She reports early satiety.  No signs or symptoms of bleeding.  She notes medication compliance.  EKG today shows ongoing elevated ventricular rate of 120 bpm with recommendation for monitor as below.  She reports recent cough with beige phlegm.  She cannot lay flat, but she clarifies this is for neck pain.  She reports today that she believes she will have to restart treatment for her cancer.  Home Medications   Current Outpatient Medications  Medication Instructions   acetaminophen (TYLENOL) 650 mg, Oral, Every 6 hours PRN   albuterol (VENTOLIN HFA) 108 (90 Base) MCG/ACT inhaler 2 puffs, Inhalation, Every 6 hours PRN   aspirin 81 mg, Oral, Daily    Budeson-Glycopyrrol-Formoterol (BREZTRI AEROSPHERE) 160-9-4.8 MCG/ACT AERO 2 puffs, Inhalation, Daily   diltiazem (CARDIZEM CD) 240 MG 24 hr capsule TAKE 1 CAPSULE BY MOUTH EVERY DAY   furosemide (LASIX) 20 mg, Oral, Daily, Pt needs to keep upcoming appt in Oct for further refills   ipratropium-albuterol (DUONEB) 0.5-2.5 (3) MG/3ML SOLN 3 mLs, Nebulization, Every  3 hours PRN   lidocaine-prilocaine (EMLA) cream APPLY TO AFFECTED AREA ONCE AS DIRECTED   montelukast (SINGULAIR) 10 mg, Oral, Daily at bedtime   neomycin-polymyxin b-dexamethasone (MAXITROL) 3.5-10000-0.1 OINT Both Eyes, 2 times daily   OXYGEN 4 L/min, Inhalation, Continuous   potassium chloride SA (KLOR-CON M20) 20 MEQ tablet TAKE 1/2 TABLET BY MOUTH EVERY DAY   rosuvastatin (CRESTOR) 5 mg, Oral, Daily, For cholesterol.   Spacer/Aero-Holding Chambers (AEROCHAMBER MV)  inhaler Use as instructed     Review of Systems    She reports tachypalpitations, dyspnea, presyncope, early satiety. She denies chest pain, pnd, orthopnea (cannot be flat due to neck pain), n, v, syncope, edema, weight gain.   All other systems reviewed and are otherwise negative except as noted above.  Physical Exam    VS:  BP 104/70 (BP Location: Right Arm, Patient Position: Sitting, Cuff Size: Large)   Pulse (!) 120   Ht 5\' 2"  (1.575 m)   Wt 202 lb (91.6 kg)   SpO2 98% Comment: 2 Liters of Oxygen  BMI 36.95 kg/m  , BMI Body mass index is 36.95 kg/m. GEN: Well nourished, well developed, in no acute distress. HEENT: normal.  Oxygen in place. Neck: Supple.  JVD difficult to assess due to body habitus.  No carotid bruits, or masses. Cardiac: tachycardic but regular, no murmurs, rubs, or gallops. No clubbing, cyanosis, trace lower extremity edema.  Radials/DP/PT 2+ and equal bilaterally.  Respiratory:  Respirations regular and unlabored, bilateral wheezing GI: Soft, nontender, nondistended, BS + x 4. MS: no deformity or atrophy. Skin: warm and dry, no  rash. Neuro:  Strength and sensation are intact. Psych: Normal affect.  Accessory Clinical Findings    ECG personally reviewed by me today - ST with PVCs versus atrial flutter with 2-1 block, low voltage, poor R wave progression in V1 to V2 with nonspecific ST/T changes, PVC- no acute changes.  VITALS Reviewed today   Temp Readings from Last 3 Encounters:  04/28/21 (!) 96.7 F (35.9 C) (Tympanic)  01/27/21 99.4 F (37.4 C) (Temporal)  12/08/20 98.2 F (36.8 C) (Temporal)   BP Readings from Last 3 Encounters:  05/13/21 104/70  04/28/21 140/65  01/27/21 90/60   Pulse Readings from Last 3 Encounters:  05/13/21 (!) 120  04/28/21 (!) 114  01/27/21 (!) 118    Wt Readings from Last 3 Encounters:  05/13/21 202 lb (91.6 kg)  04/28/21 202 lb 8 oz (91.9 kg)  01/27/21 199 lb 12 oz (90.6 kg)     LABS  reviewed today   Lab Results  Component Value Date   WBC 10.4 09/16/2020   HGB 12.2 09/16/2020   HCT 38.9 09/16/2020   MCV 87.8 09/16/2020   PLT 315 09/16/2020   Lab Results  Component Value Date   CREATININE 0.77 04/25/2021   BUN 15 04/25/2021   NA 142 04/25/2021   K 4.2 04/25/2021   CL 100 04/25/2021   CO2 31 04/25/2021   Lab Results  Component Value Date   ALT 10 04/25/2021   AST 14 (L) 04/25/2021   ALKPHOS 99 04/25/2021   BILITOT 0.3 04/25/2021   Lab Results  Component Value Date   CHOL 212 (H) 12/08/2020   HDL 42.80 12/08/2020   LDLCALC 136 (H) 03/29/2015   LDLDIRECT 148.0 12/08/2020   TRIG 217.0 (H) 12/08/2020   CHOLHDL 5 12/08/2020    Lab Results  Component Value Date   HGBA1C 5.5 12/08/2020   Lab Results  Component Value Date   TSH 2.929 10/27/2019     STUDIES/PROCEDURES reviewed today   Echo 05/25/20  1. Left ventricular ejection fraction, by estimation, is 55 to 60%. The  left ventricle has normal function. The left ventricle has no regional  wall motion abnormalities. Indeterminate diastolic filling due to E-A  fusion.   2. Right  ventricular systolic function is normal. The right ventricular  size is normal. There is normal pulmonary artery systolic pressure. The  estimated right ventricular systolic pressure is 12.8 mmHg.   3. The mitral valve is normal in structure. No evidence of mitral valve  regurgitation. No evidence of mitral stenosis.   4. The aortic valve is normal in structure. Aortic valve regurgitation is  not visualized. No aortic stenosis is present.   5. The inferior vena cava is normal in size with greater than 50%  respiratory variability, suggesting right atrial pressure of 3 mmHg.  Comparison(s): No significant change from prior study. Prior images  reviewed side by side.   Assessment & Plan    Chronic diastolic heart failure Chronic right heart failure due to COPD --Reports ongoing shortness of breath.  Echo 05/2020 with EF 55 to 60%.  Appears euvolemic on exam.  Given her recent palpitations and dizziness, obtain echo as below.  Continue current medications.  Palpitations/Dizziness --Reports palpitations and dizziness.  EKG today shows ongoing rates in the 120s, as well as PVCs.  She currently is taking Cardizem to 40 mg daily.  Escalation of Cardizem precluded by soft BP 104/70.  Recommend Zio XT x2 weeks to rule out arrhythmia, including atrial flutter with 2-1 block, as well as to quantify burden of ectopy.  Given her ongoing palpitations and dizziness, obtain echo.  Stage III lung cancer/breast cancer --S/p radiation and chemotherapy for stage III lung cancer and breast cancer.  She reports that she suspects she is going to need repeat treatment for recurrent cancer at this time.  Update echo as above.  Continue to follow with oncology as directed.  Medication changes: None Labs ordered: None Studies / Imaging ordered: Echo, Zio XT x2 weeks Future considerations: Up titration of current rate control, pending monitor results Disposition: RTC after monitor/echo.  *Please be aware that the  above documentation was completed voice recognition software and may contain dictation errors.     Arvil Chaco, PA-C 05/13/2021

## 2021-05-13 NOTE — Patient Instructions (Addendum)
Medication Instructions:  - Your physician recommends that you continue on your current medications as directed. Please refer to the Current Medication list given to you today.  *If you need a refill on your cardiac medications before your next appointment, please call your pharmacy*   Lab Work: - none ordered  If you have labs (blood work) drawn today and your tests are completely normal, you will receive your results only by: [place] (if you have MyChart) OR A paper copy in the mail If you have any lab test that is abnormal or we need to change your treatment, we will call you to review the results.   Testing/Procedures: 1) Heart Monitor- x 14 days- to be mailed to your home address to place after your pending MRI  - please call the office once you place this so we may determine when we can schedule your echocardiogram  - Your physician has recommended that you wear a Zio XT (heart) monitor.   This monitor is a medical device that records the heart's electrical activity. Doctors most often use these monitors to diagnose arrhythmias. Arrhythmias are problems with the speed or rhythm of the heartbeat. The monitor is a small device applied to your chest. You can wear one while you do your normal daily activities. While wearing this monitor if you have any symptoms to push the button and record what you felt. Once you have worn this monitor for the period of time provider prescribed (Usually 14 days), you will return the monitor device in the postage paid box. Once it is returned they will download the data collected and provide [place] with a report which the provider will then review and we will call you with those results. Important tips:  Avoid showering during the first 24 hours of wearing the monitor. Avoid excessive sweating to help maximize wear time. Do not submerge the device, no hot tubs, and no swimming pools. Keep any lotions or oils away from the patch. After 24 hours you  may shower with the patch on. Take brief showers with your back facing the shower head.  Do not remove patch once it has been placed because that will interrupt data and decrease adhesive wear time. Push the button when you have any symptoms and write down what you were feeling. Once you have completed wearing your monitor, remove and place into box which has postage paid and place in your outgoing mailbox.  If for some reason you have misplaced your box then call our office and we can provide another box and/or mail it off for you.    2) Echocardiogram: - Your physician has requested that you have an echocardiogram (after monitor completed). Echocardiography is a painless test that uses sound waves to create images of your heart. It provides your doctor with information about the size and shape of your heart and how well your heart's chambers and valves are working. This procedure takes approximately one hour. There are no restrictions for this procedure. There is a possibility that an IV may need to be started during your test to inject an image enhancing agent. This is done to obtain more optimal pictures of your heart. Therefore we ask that you do at least drink some water prior to coming in to hydrate your veins.  e   Follow-Up: At [facility], you and your health needs are our priority.  As part of our continuing mission to provide you with exceptional heart care, we have created designated Provider  Care Teams.  These Care Teams include your primary Cardiologist (physician) and Advanced Practice Providers (APPs -  Physician Assistants and Nurse Practitioners) who all work together to provide you with the care you need, when you need it.  We recommend signing up for the patient portal called "MyChart".  Sign up information is provided on this After Visit Summary.  MyChart is used to connect with patients for Virtual Visits (Telemedicine).  Patients are able to view lab/test results, encounter  notes, upcoming appointments, etc.  Non-urgent messages can be sent to your provider as well.   To learn more about what you can do with MyChart, go to [email or web address].    Your next appointment:   5-6 week(s)  The format for your next appointment:   In Person  Provider:   You may see Kathlyn Sacramento, MD or one of the following Advanced Practice Providers on your designated Care Team:   Murray Hodgkins, NP Christell Faith, PA-C Marrianne Mood, PA-C Cadence Kathlen Mody, [place]   Other Instructions  Echocardiogram An echocardiogram is a test that uses sound waves (ultrasound) to produce images of the heart. Images from an echocardiogram can provide important information about: Heart size and shape. The size and thickness and movement of your heart's walls. Heart muscle function and strength. Heart valve function or if you have stenosis. Stenosis is when the heart valves are too narrow. If blood is flowing backward through the heart valves (regurgitation). A tumor or infectious growth around the heart valves. Areas of heart muscle that are not working well because of poor blood flow or injury from a heart attack. Aneurysm detection. An aneurysm is a weak or damaged part of an artery wall. The wall bulges out from the normal force of blood pumping through the body. Tell a health care provider about: Any allergies you have. All medicines you are taking, including vitamins, herbs, eye drops, creams, and over-the-counter medicines. Any blood disorders you have. Any surgeries you have had. Any medical conditions you have. Whether you are pregnant or may be pregnant. What are the risks? Generally, this is a safe test. However, problems may occur, including an allergic reaction to dye (contrast) that may be used during the test. What happens before the test? No specific preparation is needed. You may eat and drink normally. What happens during the test?  You will take off your clothes  from the waist up and put on a hospital gown. Electrodes or electrocardiogram (ECG)patches may be placed on your chest. The electrodes or patches are then connected to a device that monitors your heart rate and rhythm. You will lie down on a table for an ultrasound exam. A gel will be applied to your chest to help sound waves pass through your skin. A handheld device, called a transducer, will be pressed against your chest and moved over your heart. The transducer produces sound waves that travel to your heart and bounce back (or "echo" back) to the transducer. These sound waves will be captured in real-time and changed into images of your heart that can be viewed on a video monitor. The images will be recorded on a computer and reviewed by your health care provider. You may be asked to change positions or hold your breath for a short time. This makes it easier to get different views or better views of your heart. In some cases, you may receive contrast through an IV in one of your veins. This can improve the quality of the pictures from  your heart. The procedure may vary among health care providers and hospitals. What can I expect after the test? You may return to your normal, everyday life, including diet, activities, and medicines, unless your health care provider tells you not to do that. Follow these instructions at home: It is up to you to get the results of your test. Ask your health care provider, or the department that is doing the test, when your results will be ready. Keep all follow-up visits. This is important. Summary An echocardiogram is a test that uses sound waves (ultrasound) to produce images of the heart. Images from an echocardiogram can provide important information about the size and shape of your heart, heart muscle function, heart valve function, and other possible heart problems. You do not need to do anything to prepare before this test. You may eat and drink  normally. After the echocardiogram is completed, you may return to your normal, everyday life, unless your health care provider tells you not to do that. This information is not intended to replace advice given to you by your health care provider. Make sure you discuss any questions you have with your health care provider. Document Revised: 02/24/2020 Document Reviewed: 02/24/2020 Elsevier Patient Education  2022 [employer].

## 2021-05-16 ENCOUNTER — Telehealth: Payer: Self-pay | Admitting: Cardiovascular Disease

## 2021-05-16 NOTE — Telephone Encounter (Signed)
Attempted to schedule testing and fu .  Lmov

## 2021-05-17 ENCOUNTER — Inpatient Hospital Stay: Payer: BLUE CROSS/BLUE SHIELD

## 2021-05-17 ENCOUNTER — Inpatient Hospital Stay: Payer: BLUE CROSS/BLUE SHIELD | Attending: Internal Medicine | Admitting: Physician Assistant

## 2021-05-17 ENCOUNTER — Other Ambulatory Visit: Payer: Self-pay

## 2021-05-17 VITALS — BP 110/72 | HR 96 | Temp 99.1°F | Resp 19 | Ht 62.0 in | Wt 203.6 lb

## 2021-05-17 DIAGNOSIS — J90 Pleural effusion, not elsewhere classified: Secondary | ICD-10-CM | POA: Diagnosis not present

## 2021-05-17 DIAGNOSIS — C50412 Malignant neoplasm of upper-outer quadrant of left female breast: Secondary | ICD-10-CM | POA: Diagnosis not present

## 2021-05-17 DIAGNOSIS — C3432 Malignant neoplasm of lower lobe, left bronchus or lung: Secondary | ICD-10-CM | POA: Insufficient documentation

## 2021-05-17 DIAGNOSIS — R5383 Other fatigue: Secondary | ICD-10-CM | POA: Diagnosis not present

## 2021-05-17 DIAGNOSIS — Z95828 Presence of other vascular implants and grafts: Secondary | ICD-10-CM

## 2021-05-17 MED ORDER — HEPARIN SOD (PORK) LOCK FLUSH 100 UNIT/ML IV SOLN
500.0000 [IU] | Freq: Once | INTRAVENOUS | Status: AC
  Administered 2021-05-17: 500 [IU]

## 2021-05-17 MED ORDER — SODIUM CHLORIDE 0.9% FLUSH
10.0000 mL | Freq: Once | INTRAVENOUS | Status: AC
  Administered 2021-05-17: 10 mL

## 2021-05-17 NOTE — Patient Instructions (Signed)
Implanted Port Home Guide An implanted port is a device that is placed under the skin. It is usually placed in the chest. The device can be used to give IV medicine, to take blood, or for dialysis. You may have an implanted port if: You need IV medicine that would be irritating to the small veins in your hands or arms. You need IV medicines, such as antibiotics, for a long period of time. You need IV nutrition for a long period of time. You need dialysis. When you have a port, your health care provider can choose to use the port instead of veins in your arms for these procedures. You may have fewer limitations when using a port than you would if you used other types of long-term IVs, and you will likely be able to return to normal activities after your incision heals. An implanted port has two main parts: Reservoir. The reservoir is the part where a needle is inserted to give medicines or draw blood. The reservoir is round. After it is placed, it appears as a small, raised area under your skin. Catheter. The catheter is a thin, flexible tube that connects the reservoir to a vein. Medicine that is inserted into the reservoir goes into the catheter and then into the vein. How is my port accessed? To access your port: A numbing cream may be placed on the skin over the port site. Your health care provider will put on a mask and sterile gloves. The skin over your port will be cleaned carefully with a germ-killing soap and allowed to dry. Your health care provider will gently pinch the port and insert a needle into it. Your health care provider will check for a blood return to make sure the port is in the vein and is not clogged. If your port needs to remain accessed to get medicine continuously (constant infusion), your health care provider will place a clear bandage (dressing) over the needle site. The dressing and needle will need to be changed every week, or as told by your health care provider. What  is flushing? Flushing helps keep the port from getting clogged. Follow instructions from your health care provider about how and when to flush the port. Ports are usually flushed with saline solution or a medicine called heparin. The need for flushing will depend on how the port is used: If the port is only used from time to time to give medicines or draw blood, the port may need to be flushed: Before and after medicines have been given. Before and after blood has been drawn. As part of routine maintenance. Flushing may be recommended every 4-6 weeks. If a constant infusion is running, the port may not need to be flushed. Throw away any syringes in a disposal container that is meant for sharp items (sharps container). You can buy a sharps container from a pharmacy, or you can make one by using an empty hard plastic bottle with a cover. How long will my port stay implanted? The port can stay in for as long as your health care provider thinks it is needed. When it is time for the port to come out, a surgery will be done to remove it. The surgery will be similar to the procedure that was done to put the port in. Follow these instructions at home:  Flush your port as told by your health care provider. If you need an infusion over several days, follow instructions from your health care provider about how   to take care of your port site. Make sure you: Wash your hands with soap and water before you change your dressing. If soap and water are not available, use alcohol-based hand sanitizer. Change your dressing as told by your health care provider. Place any used dressings or infusion bags into a plastic bag. Throw that bag in the trash. Keep the dressing that covers the needle clean and dry. Do not get it wet. Do not use scissors or sharp objects near the tube. Keep the tube clamped, unless it is being used. Check your port site every day for signs of infection. Check for: Redness, swelling, or  pain. Fluid or blood. Pus or a bad smell. Protect the skin around the port site. Avoid wearing bra straps that rub or irritate the site. Protect the skin around your port from seat belts. Place a soft pad over your chest if needed. Bathe or shower as told by your health care provider. The site may get wet as long as you are not actively receiving an infusion. Return to your normal activities as told by your health care provider. Ask your health care provider what activities are safe for you. Carry a medical alert card or wear a medical alert bracelet at all times. This will let health care providers know that you have an implanted port in case of an emergency. Get help right away if: You have redness, swelling, or pain at the port site. You have fluid or blood coming from your port site. You have pus or a bad smell coming from the port site. You have a fever. Summary Implanted ports are usually placed in the chest for long-term IV access. Follow instructions from your health care provider about flushing the port and changing bandages (dressings). Take care of the area around your port by avoiding clothing that puts pressure on the area, and by watching for signs of infection. Protect the skin around your port from seat belts. Place a soft pad over your chest if needed. Get help right away if you have a fever or you have redness, swelling, pain, drainage, or a bad smell at the port site. This information is not intended to replace advice given to you by your health care provider. Make sure you discuss any questions you have with your health care provider. Document Revised: 09/22/2020 Document Reviewed: 11/17/2019 Elsevier Patient Education  2022 [employer]  

## 2021-05-18 ENCOUNTER — Telehealth: Payer: Self-pay | Admitting: Internal Medicine

## 2021-05-18 NOTE — Telephone Encounter (Signed)
Scheduled per 11/01 los, patient has been called and notified of upcoming appointments.

## 2021-05-30 ENCOUNTER — Other Ambulatory Visit: Payer: Self-pay | Admitting: Cardiovascular Disease

## 2021-05-30 DIAGNOSIS — I5032 Chronic diastolic (congestive) heart failure: Secondary | ICD-10-CM

## 2021-05-30 DIAGNOSIS — I272 Pulmonary hypertension, unspecified: Secondary | ICD-10-CM

## 2021-06-05 ENCOUNTER — Encounter: Payer: Self-pay | Admitting: Cardiovascular Disease

## 2021-06-06 MED ORDER — DILTIAZEM HCL ER COATED BEADS 240 MG PO CP24: ORAL_CAPSULE | ORAL | 2 refills | Status: DC

## 2021-06-21 ENCOUNTER — Encounter: Payer: Self-pay | Admitting: Hematology and Oncology

## 2021-06-24 ENCOUNTER — Encounter: Payer: Self-pay | Admitting: Hematology and Oncology

## 2021-06-27 ENCOUNTER — Encounter: Payer: Self-pay | Admitting: Hematology and Oncology

## 2021-06-28 ENCOUNTER — Other Ambulatory Visit: Payer: Self-pay | Admitting: Cardiovascular Disease

## 2021-06-28 ENCOUNTER — Other Ambulatory Visit: Payer: BLUE CROSS/BLUE SHIELD

## 2021-06-28 DIAGNOSIS — I5032 Chronic diastolic (congestive) heart failure: Secondary | ICD-10-CM

## 2021-06-28 DIAGNOSIS — I272 Pulmonary hypertension, unspecified: Secondary | ICD-10-CM

## 2021-06-28 NOTE — Telephone Encounter (Signed)
Needs F/U appointment for refills. Thank you!

## 2021-07-06 ENCOUNTER — Ambulatory Visit: Payer: BC Managed Care – PPO | Admitting: Cardiovascular Disease

## 2021-07-12 ENCOUNTER — Inpatient Hospital Stay: Payer: BC Managed Care – PPO | Attending: Internal Medicine

## 2021-07-12 ENCOUNTER — Other Ambulatory Visit: Payer: Self-pay

## 2021-07-12 DIAGNOSIS — C3432 Malignant neoplasm of lower lobe, left bronchus or lung: Secondary | ICD-10-CM | POA: Insufficient documentation

## 2021-07-12 DIAGNOSIS — Z452 Encounter for adjustment and management of vascular access device: Secondary | ICD-10-CM | POA: Diagnosis not present

## 2021-07-12 DIAGNOSIS — Z95828 Presence of other vascular implants and grafts: Secondary | ICD-10-CM

## 2021-07-12 MED ORDER — SODIUM CHLORIDE 0.9% FLUSH
10.0000 mL | Freq: Once | INTRAVENOUS | Status: AC
  Administered 2021-07-12: 14:00:00 10 mL

## 2021-07-12 MED ORDER — HEPARIN SOD (PORK) LOCK FLUSH 100 UNIT/ML IV SOLN
500.0000 [IU] | Freq: Once | INTRAVENOUS | Status: AC
Start: 2021-07-12 — End: 2021-07-12
  Administered 2021-07-12: 14:00:00 500 [IU]

## 2021-07-13 ENCOUNTER — Telehealth: Payer: Self-pay

## 2021-07-13 NOTE — Telephone Encounter (Signed)
Not needed

## 2021-07-15 ENCOUNTER — Other Ambulatory Visit: Payer: Self-pay

## 2021-07-15 DIAGNOSIS — I272 Pulmonary hypertension, unspecified: Secondary | ICD-10-CM

## 2021-07-15 DIAGNOSIS — I5032 Chronic diastolic (congestive) heart failure: Secondary | ICD-10-CM

## 2021-07-15 MED ORDER — FUROSEMIDE 40 MG PO TABS
20.0000 mg | ORAL_TABLET | Freq: Every day | ORAL | 3 refills | Status: DC
Start: 2021-07-15 — End: 2021-11-09

## 2021-08-23 ENCOUNTER — Ambulatory Visit: Payer: BC Managed Care – PPO | Admitting: Cardiovascular Disease

## 2021-08-24 ENCOUNTER — Encounter (HOSPITAL_COMMUNITY): Payer: Self-pay

## 2021-08-30 ENCOUNTER — Other Ambulatory Visit: Payer: Self-pay | Admitting: Hematology and Oncology

## 2021-08-30 DIAGNOSIS — C3432 Malignant neoplasm of lower lobe, left bronchus or lung: Secondary | ICD-10-CM

## 2021-08-30 DIAGNOSIS — C50412 Malignant neoplasm of upper-outer quadrant of left female breast: Secondary | ICD-10-CM

## 2021-08-30 DIAGNOSIS — Z171 Estrogen receptor negative status [ER-]: Secondary | ICD-10-CM

## 2021-09-05 ENCOUNTER — Other Ambulatory Visit: Payer: Self-pay | Admitting: Emergency Medicine

## 2021-09-05 ENCOUNTER — Ambulatory Visit (INDEPENDENT_AMBULATORY_CARE_PROVIDER_SITE_OTHER): Payer: BC Managed Care – PPO

## 2021-09-05 DIAGNOSIS — R002 Palpitations: Secondary | ICD-10-CM

## 2021-09-06 ENCOUNTER — Ambulatory Visit: Payer: BC Managed Care – PPO | Admitting: Medical

## 2021-09-06 ENCOUNTER — Other Ambulatory Visit: Payer: Self-pay

## 2021-09-06 ENCOUNTER — Inpatient Hospital Stay: Payer: BC Managed Care – PPO | Attending: Internal Medicine

## 2021-09-16 ENCOUNTER — Ambulatory Visit: Payer: BLUE CROSS/BLUE SHIELD | Admitting: Hematology and Oncology

## 2021-09-16 ENCOUNTER — Telehealth: Payer: Self-pay

## 2021-09-16 ENCOUNTER — Inpatient Hospital Stay: Payer: BC Managed Care – PPO | Attending: Internal Medicine | Admitting: Adult Health

## 2021-09-16 NOTE — Telephone Encounter (Signed)
Called pt in regards to [place] appt today. LVM for her to return call to [facility] to r/s her appt.  ?

## 2021-09-19 ENCOUNTER — Other Ambulatory Visit: Payer: Self-pay | Admitting: Physician Assistant

## 2021-09-19 ENCOUNTER — Other Ambulatory Visit: Payer: Self-pay | Admitting: Hematology and Oncology

## 2021-09-19 ENCOUNTER — Ambulatory Visit
Admission: RE | Admit: 2021-09-19 | Discharge: 2021-09-19 | Disposition: A | Discharge status: Home / Self Care | Payer: BC Managed Care – PPO | Source: Ambulatory Visit | Attending: Physician Assistant | Admitting: Physician Assistant

## 2021-09-19 ENCOUNTER — Ambulatory Visit
Admission: RE | Admit: 2021-09-19 | Discharge: 2021-09-19 | Disposition: A | Discharge status: Home / Self Care | Payer: BC Managed Care – PPO | Source: Ambulatory Visit | Attending: Hematology and Oncology | Admitting: Hematology and Oncology

## 2021-09-19 DIAGNOSIS — R922 Inconclusive mammogram: Secondary | ICD-10-CM | POA: Diagnosis not present

## 2021-09-19 DIAGNOSIS — C50412 Malignant neoplasm of upper-outer quadrant of left female breast: Secondary | ICD-10-CM

## 2021-09-19 DIAGNOSIS — C3432 Malignant neoplasm of lower lobe, left bronchus or lung: Secondary | ICD-10-CM

## 2021-09-19 DIAGNOSIS — N6320 Unspecified lump in the left breast, unspecified quadrant: Secondary | ICD-10-CM

## 2021-09-19 DIAGNOSIS — Z853 Personal history of malignant neoplasm of breast: Secondary | ICD-10-CM | POA: Diagnosis not present

## 2021-09-29 ENCOUNTER — Ambulatory Visit
Admission: RE | Admit: 2021-09-29 | Discharge: 2021-09-29 | Disposition: A | Discharge status: Home / Self Care | Payer: BC Managed Care – PPO | Source: Ambulatory Visit | Attending: Hematology and Oncology | Admitting: Hematology and Oncology

## 2021-09-29 ENCOUNTER — Other Ambulatory Visit (HOSPITAL_COMMUNITY): Payer: Self-pay | Admitting: Diagnostic Radiology

## 2021-09-29 DIAGNOSIS — N6012 Diffuse cystic mastopathy of left breast: Secondary | ICD-10-CM | POA: Diagnosis not present

## 2021-09-29 DIAGNOSIS — N6321 Unspecified lump in the left breast, upper outer quadrant: Secondary | ICD-10-CM | POA: Diagnosis not present

## 2021-09-29 HISTORY — PX: BREAST BIOPSY: SHX20

## 2021-09-30 ENCOUNTER — Other Ambulatory Visit: Payer: Self-pay | Admitting: Hematology and Oncology

## 2021-09-30 DIAGNOSIS — N6320 Unspecified lump in the left breast, unspecified quadrant: Secondary | ICD-10-CM

## 2021-10-07 DIAGNOSIS — R002 Palpitations: Secondary | ICD-10-CM

## 2021-10-13 ENCOUNTER — Ambulatory Visit: Payer: BC Managed Care – PPO | Admitting: Medical

## 2021-10-18 ENCOUNTER — Ambulatory Visit
Admission: RE | Admit: 2021-10-18 | Discharge: 2021-10-18 | Disposition: A | Discharge status: Home / Self Care | Payer: BC Managed Care – PPO | Source: Ambulatory Visit | Attending: Hematology and Oncology | Admitting: Hematology and Oncology

## 2021-10-18 DIAGNOSIS — C50412 Malignant neoplasm of upper-outer quadrant of left female breast: Secondary | ICD-10-CM | POA: Diagnosis not present

## 2021-10-18 DIAGNOSIS — N6320 Unspecified lump in the left breast, unspecified quadrant: Secondary | ICD-10-CM

## 2021-10-18 DIAGNOSIS — N6321 Unspecified lump in the left breast, upper outer quadrant: Secondary | ICD-10-CM | POA: Diagnosis not present

## 2021-10-18 HISTORY — PX: BREAST BIOPSY: SHX20

## 2021-10-20 ENCOUNTER — Other Ambulatory Visit: Payer: Self-pay | Admitting: Hematology and Oncology

## 2021-10-20 DIAGNOSIS — R921 Mammographic calcification found on diagnostic imaging of breast: Secondary | ICD-10-CM

## 2021-10-25 ENCOUNTER — Ambulatory Visit
Admission: RE | Admit: 2021-10-25 | Discharge: 2021-10-25 | Disposition: A | Discharge status: Home / Self Care | Payer: BC Managed Care – PPO | Source: Ambulatory Visit | Attending: Hematology and Oncology | Admitting: Hematology and Oncology

## 2021-10-25 DIAGNOSIS — R921 Mammographic calcification found on diagnostic imaging of breast: Secondary | ICD-10-CM | POA: Diagnosis not present

## 2021-10-25 DIAGNOSIS — N6489 Other specified disorders of breast: Secondary | ICD-10-CM | POA: Diagnosis not present

## 2021-10-26 ENCOUNTER — Ambulatory Visit: Payer: BC Managed Care – PPO | Admitting: Nurse Practitioner

## 2021-10-26 DIAGNOSIS — R002 Palpitations: Secondary | ICD-10-CM | POA: Diagnosis not present

## 2021-10-29 ENCOUNTER — Other Ambulatory Visit: Payer: Self-pay | Admitting: Primary Care

## 2021-10-29 DIAGNOSIS — E876 Hypokalemia: Secondary | ICD-10-CM

## 2021-10-30 NOTE — Telephone Encounter (Signed)
Patient is due for follow up in June, this will be required prior to any further refills.  ?Please schedule.  ? ?

## 2021-11-01 ENCOUNTER — Other Ambulatory Visit: Payer: Self-pay

## 2021-11-01 ENCOUNTER — Inpatient Hospital Stay: Payer: BC Managed Care – PPO | Attending: Internal Medicine | Admitting: Hematology and Oncology

## 2021-11-01 DIAGNOSIS — Z171 Estrogen receptor negative status [ER-]: Secondary | ICD-10-CM

## 2021-11-01 DIAGNOSIS — C50412 Malignant neoplasm of upper-outer quadrant of left female breast: Secondary | ICD-10-CM | POA: Diagnosis not present

## 2021-11-01 DIAGNOSIS — J449 Chronic obstructive pulmonary disease, unspecified: Secondary | ICD-10-CM | POA: Insufficient documentation

## 2021-11-01 DIAGNOSIS — Z79899 Other long term (current) drug therapy: Secondary | ICD-10-CM | POA: Diagnosis not present

## 2021-11-01 DIAGNOSIS — Z9981 Dependence on supplemental oxygen: Secondary | ICD-10-CM | POA: Diagnosis not present

## 2021-11-01 DIAGNOSIS — C50812 Malignant neoplasm of overlapping sites of left female breast: Secondary | ICD-10-CM | POA: Insufficient documentation

## 2021-11-01 DIAGNOSIS — C3432 Malignant neoplasm of lower lobe, left bronchus or lung: Secondary | ICD-10-CM | POA: Insufficient documentation

## 2021-11-01 DIAGNOSIS — Z79811 Long term (current) use of aromatase inhibitors: Secondary | ICD-10-CM | POA: Diagnosis not present

## 2021-11-01 DIAGNOSIS — Z170 Estrogen receptor positive status [ER+]: Secondary | ICD-10-CM | POA: Diagnosis not present

## 2021-11-01 NOTE — Progress Notes (Signed)
? ?Patient Care Team: ?Pleas Koch, NP as PCP - General (Internal Medicine) ?[name], MD as PCP - Cardiology (Cardiology) ?[name], MD (Inactive) (Family Medicine) ?[name], MD as Consulting Physician (Cardiology) ?Rockwell Germany, RN as Oncology Nurse Navigator ?Mauro Kaufmann, RN as Oncology Nurse Navigator ? ?DIAGNOSIS:  ?Encounter Diagnosis  ?Name Primary?  ? Malignant neoplasm of upper-outer quadrant of left breast in female, estrogen receptor negative ([place])   ? ? ?SUMMARY OF ONCOLOGIC HISTORY: ?Oncology History  ?Primary malignant neoplasm of bronchus of left lower lobe ([place])  ?05/02/2018 Initial Diagnosis  ? Stage III squamous cell carcinoma of left lung ([place]) ? ?  ?05/13/2018 - 06/24/2018 Chemotherapy  ? The patient had palonosetron (ALOXI) injection 0.25 mg, 0.25 mg, Intravenous,  Once, 7 of 7 cycles ?Administration: 0.25 mg (05/13/2018), 0.25 mg (06/10/2018), 0.25 mg (06/17/2018), 0.25 mg (05/20/2018), 0.25 mg (06/24/2018), 0.25 mg (05/27/2018), 0.25 mg (06/03/2018) ?CARBOplatin (PARAPLATIN) 300 mg in sodium chloride 0.9 % 250 mL chemo infusion, 300 mg (100 % of original dose 300 mg), Intravenous,  Once, 7 of 7 cycles ?Dose modification: 300 mg (original dose 300 mg, Cycle 1) ?Administration: 300 mg (05/13/2018), 300 mg (06/10/2018), 260 mg (06/17/2018), 300 mg (05/20/2018), 300 mg (06/24/2018), 300 mg (05/27/2018), 300 mg (06/03/2018) ?PACLitaxel (TAXOL) 96 mg in sodium chloride 0.9 % 250 mL chemo infusion (</= 70m/m2), 45 mg/m2 = 96 mg, Intravenous,  Once, 7 of 7 cycles ?Administration: 96 mg (05/13/2018), 96 mg (06/10/2018), 96 mg (06/17/2018), 96 mg (05/20/2018), 96 mg (06/24/2018), 96 mg (05/27/2018), 96 mg (06/03/2018) ? ? for chemotherapy treatment.  ? ?  ?07/31/2018 - 07/15/2019 Chemotherapy  ? The patient had durvalumab (IMFINZI) 1,000 mg in sodium chloride 0.9 % 100 mL chemo infusion, 1,020 mg, Intravenous,  Once, 26 of 26 cycles ?Administration: 1,000 mg (07/31/2018),  1,000 mg (10/22/2018), 1,000 mg (11/05/2018), 1,000 mg (11/19/2018), 1,000 mg (12/03/2018), 1,000 mg (08/14/2018), 1,000 mg (12/17/2018), 1,000 mg (08/27/2018), 1,000 mg (09/10/2018), 1,000 mg (09/24/2018), 1,000 mg (10/11/2018), 1,000 mg (12/31/2018), 1,000 mg (01/14/2019), 1,120 mg (01/28/2019), 1,120 mg (02/11/2019), 1,120 mg (02/25/2019), 1,120 mg (03/11/2019), 1,120 mg (03/25/2019), 1,120 mg (04/08/2019), 1,120 mg (04/22/2019), 1,120 mg (05/06/2019), 1,120 mg (05/20/2019), 1,120 mg (06/03/2019), 1,120 mg (06/17/2019), 1,120 mg (07/01/2019), 1,120 mg (07/15/2019) ? ? for chemotherapy treatment.  ? ?  ?05/11/2020 - 05/13/2020 Chemotherapy  ? Received 1 cycle of Adriamycin and Cytoxan and discontinued it ?  ?Malignant neoplasm of upper-outer quadrant of left breast in female, estrogen receptor negative ([place]  ?02/19/2020 Cancer Staging  ? Staging form: Breast, AJCC 8th Edition ?- Clinical stage from 02/19/2020: Stage IIB (cT2, cN0, cM0, G3, ER-, PR-, HER2-) - Signed by CGardenia Phlegm NP on 02/25/2020 ? ?  ?02/19/2020 Initial Diagnosis  ? Chest CT for lung cancer follow-up showed a left breast module. Mammogram and [place]ore[place]owed a 3.7cm mass with calcifications at the 2 o'clock position in the left breast, no left axillary adenopathy. Biopsy showed IDC, grade 3, HER-2 negative (0), ER/PR negative, Ki67 40%. ?  ?04/08/2020 Surgery  ? Left lumpectomy ([name]: three foci of IDC, grade 3, 3.3cm, 1.0cm, 0.7cm, with high grade DCIS, clear margins, 2 left axillary lymph nodes negative for carcinoma.  ?  ?05/11/2020 - 05/13/2020 Chemotherapy  ? Received 1 cycle of Adriamycin and Cytoxan and discontinued it ?  ?10/18/2021 Relapse/Recurrence  ? Mammogram detected indeterminate left breast mass at 12 o'clock position measuring 6 mm, 2 focal areas of calcifications (H 8 mm) within  the left breast favored to be fat necrosis. ?The 6 mm mass on biopsy came back as grade 3 IDC ER 30% weak, PR 0%, HER2 negative, Ki-67 20% ?  ? ? ?CHIEF COMPLIANT: Follow-up  breast cancer ? ?INTERVAL HISTORY: Deborah [name] is a 66 y.o. with the above mention breast cancer. She presents to the clinic today for a follow-up.  Recently she underwent mammograms that revealed abnormalities in the left breast.  She underwent 3 biopsies of which one of them came back positive for grade 3 invasive ductal carcinoma that was ER 30% weakly positive, PR negative, HER2 negative, Ki-67 20%.  She is here today to discuss treatment plan.  Previously she had a diagnosis of triple negative breast cancer that underwent a lumpectomy.  She was not given radiation because of her severe COPD. ? ?ALLERGIES:  has No Known Allergies. ? ?MEDICATIONS:  ?Current Outpatient Medications  ?Medication Sig Dispense Refill  ? acetaminophen (TYLENOL) 325 MG tablet Take 650 mg by mouth every 6 (six) hours as needed for headache (pain).    ? albuterol (VENTOLIN HFA) 108 (90 Base) MCG/ACT inhaler Inhale 2 puffs into the lungs every 6 (six) hours as needed for wheezing or shortness of breath. 8 g 3  ? aspirin 81 MG chewable tablet Chew 1 tablet (81 mg total) by mouth daily. 30 tablet 1  ? Budeson-Glycopyrrol-Formoterol (BREZTRI AEROSPHERE) 160-9-4.8 MCG/ACT AERO Inhale 2 puffs into the lungs daily. (Patient taking differently: Inhale 2 puffs into the lungs daily as needed (shortness of breath/wheezing).) 5.9 g 0  ? diltiazem (CARDIZEM CD) 240 MG 24 hr capsule TAKE 1 CAPSULE BY MOUTH EVERY DAY 30 capsule 2  ? furosemide (LASIX) 40 MG tablet Take 0.5 tablets (20 mg total) by mouth daily. 30 tablet 3  ? ipratropium-albuterol (DUONEB) 0.5-2.5 (3) MG/3ML SOLN Take 3 mLs by nebulization every 3 (three) hours as needed. (Patient taking differently: Take 3 mLs by nebulization every 3 (three) hours as needed (shortness of breath/wheezing).) 360 mL 11  ? lidocaine-prilocaine (EMLA) cream APPLY TO AFFECTED AREA ONCE AS DIRECTED 30 g 3  ? montelukast (SINGULAIR) 10 MG tablet Take 1 tablet (10 mg total) by mouth at bedtime. 90 tablet 2   ? neomycin-polymyxin b-dexamethasone (MAXITROL) 3.5-10000-0.1 OINT Place into both eyes 2 (two) times daily.    ? OXYGEN Inhale 4 L/min into the lungs continuous.     ? potassium chloride SA (KLOR-CON M20) 20 MEQ tablet TAKE 1/2 TABLET BY MOUTH EVERY DAY. Office visit required for further refills. 45 tablet 0  ? rosuvastatin (CRESTOR) 5 MG tablet Take 1 tablet (5 mg total) by mouth daily. For cholesterol. 90 tablet 3  ? Spacer/Aero-Holding Chambers (AEROCHAMBER MV) inhaler Use as instructed 1 each 0  ? ?No current facility-administered medications for this visit.  ? ? ?PHYSICAL EXAMINATION: ?ECOG PERFORMANCE STATUS: 1 - Symptomatic but completely ambulatory ? ?Vitals:  ? 11/01/21 1211  ?BP: 130/84  ?Pulse: (!) 121  ?Resp: 19  ?Temp: 98.1 ?F (36.7 ?C)  ?SpO2: 98%  ? ?Filed Weights  ? 11/01/21 1211  ?Weight: 198 lb 9.6 oz (90.1 kg)  ? ?  ? ?LABORATORY DATA:  ?I have reviewed the data as listed ? ?  Latest Ref Rng & Units 04/25/2021  ? 11:53 AM 09/16/2020  ?  2:45 PM 06/08/2020  ? 12:56 PM  ?CMP  ?Glucose 70 - 99 mg/dL 114   116   103    ?BUN 8 - 23 mg/dL 15   13   <  5    ?Creatinine 0.44 - 1.00 mg/dL 0.77   0.73   0.35    ?Sodium 135 - 145 mmol/L 142   136   140    ?Potassium 3.5 - 5.1 mmol/L 4.2   3.7   3.7    ?Chloride 98 - 111 mmol/L 100   97   100    ?CO2 22 - 32 mmol/L $RemoveB'31   31   31    '[id number]$ ?Calcium 8.9 - 10.3 mg/dL 9.4   9.3   7.7    ?Total Protein 6.5 - 8.1 g/dL 7.9   8.0     ?Total Bilirubin 0.3 - 1.2 mg/dL 0.3   0.3     ?Alkaline Phos 38 - 126 U/L 99   83     ?AST 15 - 41 U/L 14   27     ?ALT 0 - 44 U/L 10   31     ? ? ?Lab Results  ?Component Value Date  ? WBC 10.4 09/16/2020  ? HGB 12.2 09/16/2020  ? HCT 38.9 09/16/2020  ? MCV 87.8 09/16/2020  ? PLT 315 09/16/2020  ? NEUTROABS 8.4 (H) 09/16/2020  ? ? ?ASSESSMENT & PLAN:  ?Malignant neoplasm of upper-outer quadrant of left breast in female, estrogen receptor negative ([place]) ?04/08/20: Left lumpectomy [name]): three foci of IDC, grade 3, 3.3cm, 1.0cm, 0.7cm, with high  grade DCIS, clear margins, 2 left axillary lymph nodes negative for carcinoma. HER-2 negative (0), ER/PR negative, Ki67 40%. ?    ?Treatment Plan: ?1. Adjuvant chemotherapy with Adriamycin and Cytoxan d

## 2021-11-01 NOTE — Assessment & Plan Note (Addendum)
04/08/20:?Left lumpectomy Deborah [name]): three foci of IDC, grade 3, 3.3cm, 1.0cm, 0.7cm, with high grade DCIS, clear margins, 2 left axillary lymph nodes negative for carcinoma. HER-2 negative (0), ER/PR negative, Ki67 40%. ?? ? ?Treatment Plan: ?1.?Adjuvant chemotherapy with Adriamycin and Cytoxan dose dense ?4 followed by Taxol weekly ?12 (discontinued after cycle 1 because of profound fatigue and generalized failure to thrive along with hypoxia and tachycardia) the first cycle was even given at a low dose ?2. Followed by adjuvant radiation therapy (patient does not want to receive radiation) ?? ?Severe COPD: 24-hour oxygen ?-------------------------------------------------------------------------------------------------------------------------------------------- ?Hospitalization: GI bleed: 4 units of PRBC given November 2021: Today's hemoglobin is 12.2. ? ?Relapse: ?Mammogram detected indeterminate left breast mass at 12 o'clock position measuring 6 mm, 2 focal areas of calcifications (H 8 mm) within the left breast favored to be fat necrosis. ?The 6 mm mass on biopsy came back as grade 3 IDC ER 30% weak, PR 0%, HER2 negative, Ki-67 20% ? ?Treatment plan: ?1.  Patient has appointment to see surgery to discuss surgical options.  I suspect that she cannot tolerate anything more than a lumpectomy. ?2. we will repeat breast prognostic panel on the final path and determine if the tumor is truly estrogen receptor positive.  If it is positive then we can consider antiestrogen therapy. ? ?She has extremely severe hot flashes and is really concerned about antiestrogen therapy side effects. ?Because of severe COPD, she is not a candidate for radiation. ? ?Return to clinic after surgery to discuss final pathology results. ?

## 2021-11-03 ENCOUNTER — Encounter: Payer: Self-pay | Admitting: Internal Medicine

## 2021-11-03 ENCOUNTER — Encounter: Payer: Self-pay | Admitting: *Deleted

## 2021-11-03 NOTE — Telephone Encounter (Signed)
Called patient appointment made for June.  ?

## 2021-11-07 DIAGNOSIS — Z85118 Personal history of other malignant neoplasm of bronchus and lung: Secondary | ICD-10-CM | POA: Diagnosis not present

## 2021-11-07 DIAGNOSIS — C50912 Malignant neoplasm of unspecified site of left female breast: Secondary | ICD-10-CM | POA: Diagnosis not present

## 2021-11-08 ENCOUNTER — Telehealth: Payer: Self-pay | Admitting: Hematology and Oncology

## 2021-11-08 NOTE — Telephone Encounter (Signed)
Per 4/24 in basket called and spoke to pt about appointment.  Pt confirmed appointment  ?

## 2021-11-09 ENCOUNTER — Ambulatory Visit: Payer: BC Managed Care – PPO | Admitting: Medical

## 2021-11-09 ENCOUNTER — Encounter: Payer: Self-pay | Admitting: Medical

## 2021-11-09 ENCOUNTER — Other Ambulatory Visit
Admission: RE | Admit: 2021-11-09 | Discharge: 2021-11-09 | Disposition: A | Discharge status: Home / Self Care | Payer: BC Managed Care – PPO | Attending: Medical | Admitting: Medical

## 2021-11-09 VITALS — BP 116/60 | HR 111 | Ht 63.0 in | Wt 200.0 lb

## 2021-11-09 DIAGNOSIS — R000 Tachycardia, unspecified: Secondary | ICD-10-CM

## 2021-11-09 DIAGNOSIS — Z171 Estrogen receptor negative status [ER-]: Secondary | ICD-10-CM

## 2021-11-09 DIAGNOSIS — R002 Palpitations: Secondary | ICD-10-CM

## 2021-11-09 DIAGNOSIS — I272 Pulmonary hypertension, unspecified: Secondary | ICD-10-CM

## 2021-11-09 DIAGNOSIS — J449 Chronic obstructive pulmonary disease, unspecified: Secondary | ICD-10-CM

## 2021-11-09 DIAGNOSIS — I5032 Chronic diastolic (congestive) heart failure: Secondary | ICD-10-CM | POA: Diagnosis not present

## 2021-11-09 DIAGNOSIS — C50412 Malignant neoplasm of upper-outer quadrant of left female breast: Secondary | ICD-10-CM

## 2021-11-09 LAB — BASIC METABOLIC PANEL
Anion gap: 6 (ref 5–15)
BUN: 11 mg/dL (ref 8–23)
CO2: 34 mmol/L — ABNORMAL HIGH (ref 22–32)
Calcium: 8.7 mg/dL — ABNORMAL LOW (ref 8.9–10.3)
Chloride: 100 mmol/L (ref 98–111)
Creatinine, Ser: 0.7 mg/dL (ref 0.44–1.00)
GFR, Estimated: >60 mL/min (ref >60)
Glucose, Bld: 106 mg/dL — ABNORMAL HIGH (ref 70–99)
Potassium: 3.8 mmol/L (ref 3.5–5.1)
Sodium: 140 mmol/L (ref 135–145)

## 2021-11-09 MED ORDER — FUROSEMIDE 20 MG PO TABS: 30.0000 mg | ORAL_TABLET | Freq: Every day | ORAL | 3 refills | Status: DC

## 2021-11-09 NOTE — Assessment & Plan Note (Addendum)
04/08/20:?Left lumpectomy Deborah [name]): three foci of IDC, grade 3, 3.3cm, 1.0cm, 0.7cm, with high grade DCIS, clear margins, 2 left axillary lymph nodes negative for carcinoma. HER-2 negative (0), ER/PR negative, Ki67 40%. ?? ? ?Treatment Plan: ?1.?Adjuvant chemotherapy with Adriamycin and Cytoxan dose dense ?4 followed by Taxol weekly ?12?(discontinued after cycle 1 because of profound fatigue and generalized failure to thrive along with hypoxia and tachycardia) the first cycle was even given at a low dose ?2. Followed by adjuvant radiation therapy?(patient does not want to receive radiation) ?? ?Severe COPD: 24-hour oxygen ?-------------------------------------------------------------------------------------------------------------------------------------------- ?Hospitalization: GI bleed: 4 units of PRBC given November 2021: Today's hemoglobin is 12.2. ?? ?Relapse: ?Mammogram detected indeterminate left breast mass at 12 o'clock position measuring 6 mm, 2 focal areas of calcifications (H 8 mm) within the left breast favored to be fat necrosis. ?The 6 mm mass on biopsy came back as grade 3 IDC ER 30% weak, PR 0%, HER2 negative, Ki-67 20% ?? ?Treatment plan: ?1.    Surgery decided that she is too high risk for surgery and therefore medical management was advised. ?We will start her up oral antiestrogen therapy with letrozole. ?We will treat her with letrozole for 6 months and then reassess with a mammogram and ultrasound. ? ?If her breathing significantly improves then we can reassess for surgery even sooner than 6 months. ?

## 2021-11-09 NOTE — Patient Instructions (Signed)
Medication Instructions:  ?Your physician has recommended you make the following change in your medication:  ? ?INCREASE lasix to 30 mg daily  ? ?*If you need a refill on your cardiac medications before your next appointment, please call your pharmacy* ? ? ?Lab Work: ? ?TODAY: BMET ? ?[facility] Entr[facility] at [facility] ?1st desk on the right to check in, past the screening table ?Lab hours: Monday- Friday (7:30 am- 5:30 pm)  ? ?If you have labs (blood work) drawn today and your tests are completely normal, you will receive your results only by: ?MyChart Message (if you have MyChart) OR ?A paper copy in the mail ?If you have any lab test that is abnormal or we need to change your treatment, we will call you to review the results. ? ? ?Testing/Procedures: ?None ordered ? ? ?Follow-Up: ?At [facility], you and your health needs are our priority.  As part of our continuing mission to provide you with exceptional heart care, we have created designated Provider Care Teams.  These Care Teams include your primary Cardiologist (physician) and Advanced Practice Providers (APPs -  Physician Assistants and Nurse Practitioners) who all work together to provide you with the care you need, when you need it. ? ?We recommend signing up for the patient portal called "MyChart".  Sign up information is provided on this After Visit Summary.  MyChart is used to connect with patients for Virtual Visits (Telemedicine).  Patients are able to view lab/test results, encounter notes, upcoming appointments, etc.  Non-urgent messages can be sent to your provider as well.   ?To learn more about what you can do with MyChart, go to [email or web address].   ? ?Your next appointment:   ?6 month(s) ? ?The format for your next appointment:   ?In Person ? ?Provider:   ?You may see Kathlyn [place], MD or one of the following Advanced Practice Providers on your designated Care Team:   ?Murray Hodgkins, NP ?Christell Faith, PA-C ?Cadence Kathlen Mody, PA-C  ? ? ?Other  Instructions ?N/A ? ?Important Information About Sugar ? ? ? ? ?  ?

## 2021-11-09 NOTE — Progress Notes (Signed)
?Cardiology Office Note:   ? ?Date:  11/09/2021  ? ?ID:  Deborah [name], DOB 05-09-1956, MRN [id number] ? ?PCP:  Pleas Koch, NP  ?[facility] Cardiologist:  Kathlyn Sacramento, MD  ?[facility] Electrophysiologist:  None  ? ?Referring MD: Pleas Koch, NP  ? ?Chief Complaint: 6 month follow-up ? ?History of Present Illness:   ? ?Deborah [name] is a 66 y.o. female with a hx of with history of chronic diastolic heart failure, chronic right heel heart failure due to COPD, previous tobacco use with COPD, hypertension, obesity, bipolar disorder, and here today for follow-up. ? ?She was admitted 07/2014 for CHF.  She had a moderate left pleural effusion.  Echo showed EF 50 to 55%, G1 DD, dilated with RV with hypokinesis and mild pulmonary hypertension.  VQ scan showed low probability for pulmonary embolism.  She subsequently quit smoking.   ?  ?She was diagnosed with squamous cell carcinoma of the lung, stage IIIb 07/2017.  She had a large left lower lobe mass in addition to mediastinal lymphadenopathy.  She was treated with radiation, chemotherapy, and immunotherapy. ? ?Subsequent echo showed EF 55 to 60%, NR WMA, indeterminate diastolic filling due to EA fusion, RVSP 35.0 mmHg.  ? ?Last seen 04/2021 and HR were still high, Zio was recommended.  ? ?Heart monitor showed predominately normal rhthym, average HR 90s, with 1st degree AV block, and Second degree AV block, brief SVT, rate PVCs. ? ?Today, the patient reports she has been doing well since the last visit. She has breast cancer again, may be doing chemotherapy again. Do not want to do surgery. She has COPD with 2-4L O2. No chest pain, LLE, palpitations, orthopnea, pnd. HR is up but she recently used her inhaler. She is on lasix 30mg  daily., 20 in the AM and 10 at night. Says this helps with feeling of being bloated and swollen. She may have self-changed this, unsure when this occurred. The heart monitor was reviewed in detail.  ? ?Past Medical  History:  ?Diagnosis Date  ? Anemia   ? as teen  ? Asthma   ? Breast cancer ([place])   ? left breast cancer 02/2020  ? CHF (congestive heart failure) ([place])   ? COPD, mild ([place])   ? Depression   ? Diverticulitis   ? Family history of anesthesia complication   ? vomiting  ? GI bleeding   ? Heart failure ([place])   ? New onset 07/25/14  ? Histoplasmosis   ? left eye  ? Hyperkalemia   ? Hypertension   ? Lung cancer ([place]) 05/02/2018  ? s/p chemoradiation, immunotherapy  ? Obesity (BMI 30-39.9)   ? Pneumonia   ? dx wtih pneumonia on 05/27/16- seen by [facility]   ? PONV (postoperative nausea and vomiting)   ? Restrictive lung disease   ? Shortness of breath   ? with exertion   ? Sleep apnea   ? mask and oxygen at nite for sleep at 2L   ? Tobacco abuse   ? Umbilical hernia   ? ? ?Past Surgical History:  ?Procedure Laterality Date  ? APPLICATION OF WOUND VAC  03/19/2013  ? Procedure: APPLICATION OF WOUND VAC;  Surgeon: Madilyn Hook, DO;  Location: [facility];  Service: General;;  ? BREAST BIOPSY Left 09/29/2021  ? BREAST BIOPSY Left 10/18/2021  ? BREAST LUMPECTOMY Left 04/08/2020  ? BREAST LUMPECTOMY WITH RADIOACTIVE SEED AND SENTINEL LYMPH NODE BIOPSY Left 04/08/2020  ? Procedure: LEFT BREAST LUMPECTOMY  WITH RADIOACTIVE SEED AND LEFT AXILLARY SENTINEL LYMPH NODE BIOPSY;  Surgeon: Alphonsa Overall, MD;  Location: [facility];  Service: General;  Laterality: Left;  PEC BLOCK, NEEDS ANESTHESIA CONSULT  ? CESAREAN SECTION  04/12/1985  ? COLONOSCOPY WITH PROPOFOL N/A 06/13/2016  ? Procedure: COLONOSCOPY WITH PROPOFOL;  Surgeon: Jerene Bears, MD;  Location: [facility];  Service: Gastroenterology;  Laterality: N/A;  ? ERCP N/A 02/15/2017  ? Procedure: ENDOSCOPIC RETROGRADE CHOLANGIOPANCREATOGRAPHY (ERCP);  Surgeon: [name], MD;  Location: [facility];  Service: Endoscopy;  Laterality: N/A;  ? INSERTION OF MESH N/A 03/19/2013  ? Procedure: INSERTION OF MESH;  Surgeon: Madilyn Hook, DO;  Location: [facility];  Service: General;  Laterality: N/A;   ? IR IMAGING GUIDED PORT INSERTION  03/26/2020  ? IR THORACENTESIS ASP PLEURAL SPACE W/IMG GUIDE  05/25/2020  ? VENTRAL HERNIA REPAIR N/A 03/19/2013  ? Procedure:  OPEN VENTRAL HERNIA REPAIR WITH MESH AND APPLICATION OF WOUND VAC;  Surgeon: Madilyn Hook, DO;  Location: [facility];  Service: General;  Laterality: N/A;  ? VIDEO BRONCHOSCOPY Bilateral 04/19/2018  ? Procedure: VIDEO BRONCHOSCOPY WITH FLUORO;  Surgeon: Rigoberto Noel, MD;  Location: [facility];  Service: Cardiopulmonary;  Laterality: Bilateral;  ? ? ?Current Medications: ?Current Meds  ?Medication Sig  ? acetaminophen (TYLENOL) 325 MG tablet Take 650 mg by mouth every 6 (six) hours as needed for headache (pain).  ? albuterol (VENTOLIN HFA) 108 (90 Base) MCG/ACT inhaler Inhale 2 puffs into the lungs every 6 (six) hours as needed for wheezing or shortness of breath.  ? aspirin 81 MG chewable tablet Chew 1 tablet (81 mg total) by mouth daily.  ? Budeson-Glycopyrrol-Formoterol (BREZTRI AEROSPHERE) 160-9-4.8 MCG/ACT AERO Inhale 2 puffs into the lungs daily.  ? diltiazem (CARDIZEM CD) 240 MG 24 hr capsule TAKE 1 CAPSULE BY MOUTH EVERY DAY  ? ipratropium-albuterol (DUONEB) 0.5-2.5 (3) MG/3ML SOLN Take 3 mLs by nebulization every 3 (three) hours as needed.  ? lidocaine-prilocaine (EMLA) cream APPLY TO AFFECTED AREA ONCE AS DIRECTED  ? montelukast (SINGULAIR) 10 MG tablet Take 1 tablet (10 mg total) by mouth at bedtime.  ? neomycin-polymyxin b-dexamethasone (MAXITROL) 3.5-10000-0.1 OINT Place into both eyes 2 (two) times daily.  ? OXYGEN Inhale 4 L/min into the lungs continuous.   ? potassium chloride SA (KLOR-CON M20) 20 MEQ tablet TAKE 1/2 TABLET BY MOUTH EVERY DAY. Office visit required for further refills.  ? rosuvastatin (CRESTOR) 5 MG tablet Take 1 tablet (5 mg total) by mouth daily. For cholesterol.  ? Spacer/Aero-Holding Chambers (AEROCHAMBER MV) inhaler Use as instructed  ? [DISCONTINUED] furosemide (LASIX) 40 MG tablet Take 0.5 tablets (20 mg total) by  mouth daily.  ?  ? ?Allergies:   Patient has no known allergies.  ? ?Social History  ? ?Socioeconomic History  ? Marital status: Married  ?  Spouse name: Not on file  ? Number of children: 1  ? Years of education: Not on file  ? Highest education level: Not on file  ?Occupational History  ? Occupation: Retired  ?  Employer: NOT EMPLOYED  ?Tobacco Use  ? Smoking status: Former  ?  Packs/day: 3.00  ?  Years: 43.00  ?  Pack years: 129.00  ?  Types: Cigarettes  ?  Quit date: 12/17/2012  ?  Years since quitting: 8.9  ? Smokeless tobacco: Never  ?Vaping Use  ? Vaping Use: Former  ?Substance and Sexual Activity  ? Alcohol use: No  ?  Alcohol/week: 0.0 standard drinks  ?  Drug use: No  ? Sexual activity: Not on file  ?Other Topics Concern  ? Not on file  ?Social History Narrative  ? ** Merged History Encounter **  ?    ? ** Data from: 07/25/14 Enc Dept: [place] DEPT  ?    ? ** Data from: 06/05/13 Enc Dept: [facility]  ? Married, one son 57 yo.  ?   ?   ?   ? ?Social Determinants of Health  ? ?Financial Resource Strain: Not on file  ?Food Insecurity: Not on file  ?Transportation Needs: Not on file  ?Physical Activity: Not on file  ?Stress: Not on file  ?Social Connections: Not on file  ?  ? ?Family History: ?The patient's family history includes Bipolar disorder in her father and sister; Congestive Heart Failure in her father; Emphysema in her mother; Heart attack in her maternal uncle, maternal uncle, and mother; Lymphoma in her maternal uncle; Melanoma in her sister; Other in her sister; Pancreatic cancer in her maternal aunt; Schizophrenia in her sister. There is no history of Colon cancer or Breast cancer. ? ?ROS:   ?Please see the history of present illness.    ? All other systems reviewed and are negative. ? ?EKGs/Labs/Other Studies Reviewed:   ? ?The following studies were reviewed today: ? ?Heart monitor 10/2021 ?Patient had a min HR of 53 bpm, max HR of 197 bpm, and avg HR of 91 bpm.  ?Predominant underlying  rhythm was Sinus Rhythm. First Degree AV Block was present.  ?5 Supraventricular Tachycardia runs occurred, the run with the fastest interval lasting 8 beats with a max rate of 197 bpm, the longest lasting

## 2021-11-10 ENCOUNTER — Inpatient Hospital Stay: Payer: BC Managed Care – PPO | Admitting: Hematology and Oncology

## 2021-11-10 ENCOUNTER — Other Ambulatory Visit: Payer: Self-pay

## 2021-11-10 DIAGNOSIS — Z171 Estrogen receptor negative status [ER-]: Secondary | ICD-10-CM

## 2021-11-10 DIAGNOSIS — Z79899 Other long term (current) drug therapy: Secondary | ICD-10-CM | POA: Diagnosis not present

## 2021-11-10 DIAGNOSIS — Z79811 Long term (current) use of aromatase inhibitors: Secondary | ICD-10-CM | POA: Diagnosis not present

## 2021-11-10 DIAGNOSIS — C50412 Malignant neoplasm of upper-outer quadrant of left female breast: Secondary | ICD-10-CM | POA: Diagnosis not present

## 2021-11-10 DIAGNOSIS — Z170 Estrogen receptor positive status [ER+]: Secondary | ICD-10-CM | POA: Diagnosis not present

## 2021-11-10 DIAGNOSIS — C3432 Malignant neoplasm of lower lobe, left bronchus or lung: Secondary | ICD-10-CM | POA: Diagnosis not present

## 2021-11-10 DIAGNOSIS — C50812 Malignant neoplasm of overlapping sites of left female breast: Secondary | ICD-10-CM | POA: Diagnosis not present

## 2021-11-10 DIAGNOSIS — J449 Chronic obstructive pulmonary disease, unspecified: Secondary | ICD-10-CM | POA: Diagnosis not present

## 2021-11-10 DIAGNOSIS — Z9981 Dependence on supplemental oxygen: Secondary | ICD-10-CM | POA: Diagnosis not present

## 2021-11-10 MED ORDER — LETROZOLE 2.5 MG PO TABS: 2.5000 mg | ORAL_TABLET | Freq: Every day | ORAL | 3 refills | Status: DC

## 2021-11-10 NOTE — Progress Notes (Signed)
? ?Patient Care Team: ?Pleas Koch, NP as PCP - General (Internal Medicine) ?[name], MD as PCP - Cardiology (Cardiology) ?[name], MD (Inactive) (Family Medicine) ?[name], MD as Consulting Physician (Cardiology) ?Rockwell Germany, RN as Oncology Nurse Navigator ?Mauro Kaufmann, RN as Oncology Nurse Navigator ? ?DIAGNOSIS:  ?Encounter Diagnosis  ?Name Primary?  ? Malignant neoplasm of upper-outer quadrant of left breast in female, estrogen receptor negative ([place])   ? ? ?SUMMARY OF ONCOLOGIC HISTORY: ?Oncology History  ?Primary malignant neoplasm of bronchus of left lower lobe ([place])  ?05/02/2018 Initial Diagnosis  ? Stage III squamous cell carcinoma of left lung ([place]) ? ?  ?05/13/2018 - 06/24/2018 Chemotherapy  ? The patient had palonosetron (ALOXI) injection 0.25 mg, 0.25 mg, Intravenous,  Once, 7 of 7 cycles ?Administration: 0.25 mg (05/13/2018), 0.25 mg (06/10/2018), 0.25 mg (06/17/2018), 0.25 mg (05/20/2018), 0.25 mg (06/24/2018), 0.25 mg (05/27/2018), 0.25 mg (06/03/2018) ?CARBOplatin (PARAPLATIN) 300 mg in sodium chloride 0.9 % 250 mL chemo infusion, 300 mg (100 % of original dose 300 mg), Intravenous,  Once, 7 of 7 cycles ?Dose modification: 300 mg (original dose 300 mg, Cycle 1) ?Administration: 300 mg (05/13/2018), 300 mg (06/10/2018), 260 mg (06/17/2018), 300 mg (05/20/2018), 300 mg (06/24/2018), 300 mg (05/27/2018), 300 mg (06/03/2018) ?PACLitaxel (TAXOL) 96 mg in sodium chloride 0.9 % 250 mL chemo infusion (</= 70m/m2), 45 mg/m2 = 96 mg, Intravenous,  Once, 7 of 7 cycles ?Administration: 96 mg (05/13/2018), 96 mg (06/10/2018), 96 mg (06/17/2018), 96 mg (05/20/2018), 96 mg (06/24/2018), 96 mg (05/27/2018), 96 mg (06/03/2018) ? ? for chemotherapy treatment.  ? ?  ?07/31/2018 - 07/15/2019 Chemotherapy  ? The patient had durvalumab (IMFINZI) 1,000 mg in sodium chloride 0.9 % 100 mL chemo infusion, 1,020 mg, Intravenous,  Once, 26 of 26 cycles ?Administration: 1,000 mg (07/31/2018),  1,000 mg (10/22/2018), 1,000 mg (11/05/2018), 1,000 mg (11/19/2018), 1,000 mg (12/03/2018), 1,000 mg (08/14/2018), 1,000 mg (12/17/2018), 1,000 mg (08/27/2018), 1,000 mg (09/10/2018), 1,000 mg (09/24/2018), 1,000 mg (10/11/2018), 1,000 mg (12/31/2018), 1,000 mg (01/14/2019), 1,120 mg (01/28/2019), 1,120 mg (02/11/2019), 1,120 mg (02/25/2019), 1,120 mg (03/11/2019), 1,120 mg (03/25/2019), 1,120 mg (04/08/2019), 1,120 mg (04/22/2019), 1,120 mg (05/06/2019), 1,120 mg (05/20/2019), 1,120 mg (06/03/2019), 1,120 mg (06/17/2019), 1,120 mg (07/01/2019), 1,120 mg (07/15/2019) ? ? for chemotherapy treatment.  ? ?  ?05/11/2020 - 05/13/2020 Chemotherapy  ? Received 1 cycle of Adriamycin and Cytoxan and discontinued it ?  ?Malignant neoplasm of upper-outer quadrant of left breast in female, estrogen receptor negative ([place]  ?02/19/2020 Cancer Staging  ? Staging form: Breast, AJCC 8th Edition ?- Clinical stage from 02/19/2020: Stage IIB (cT2, cN0, cM0, G3, ER-, PR-, HER2-) - Signed by CGardenia Phlegm NP on 02/25/2020 ? ?  ?02/19/2020 Initial Diagnosis  ? Chest CT for lung cancer follow-up showed a left breast module. Mammogram and UKore[place]owed a 3.7cm mass with calcifications at the 2 o'clock position in the left breast, no left axillary adenopathy. Biopsy showed IDC, grade 3, HER-2 negative (0), ER/PR negative, Ki67 40%. ?  ?04/08/2020 Surgery  ? Left lumpectomy ([name]: three foci of IDC, grade 3, 3.3cm, 1.0cm, 0.7cm, with high grade DCIS, clear margins, 2 left axillary lymph nodes negative for carcinoma.  ?  ?05/11/2020 - 05/13/2020 Chemotherapy  ? Received 1 cycle of Adriamycin and Cytoxan and discontinued it ?  ?10/18/2021 Relapse/Recurrence  ? Mammogram detected indeterminate left breast mass at 12 o'clock position measuring 6 mm, 2 focal areas of calcifications (H 8 mm) within  the left breast favored to be fat necrosis. ?The 6 mm mass on biopsy came back as grade 3 IDC ER 30% weak, PR 0%, HER2 negative, Ki-67 20% ?  ? ? ?CHIEF COMPLIANT: Follow-up  breast cancer ?  ? ?INTERVAL HISTORY: SUBRENA DEVEREUX is a  66 y.o. with the above mention breast cancer. She presents to the clinic today for a follow-up. She didn't go through surgery due to her lung function. She stated she have had fluid removed from her lungs previously.  She has an appointment to see Dr. Julien Nordmann after CT scans are coming up. ? ?ALLERGIES:  has No Known Allergies. ? ?MEDICATIONS:  ?Current Outpatient Medications  ?Medication Sig Dispense Refill  ? letrozole (FEMARA) 2.5 MG tablet Take 1 tablet (2.5 mg total) by mouth daily. 90 tablet 3  ? acetaminophen (TYLENOL) 325 MG tablet Take 650 mg by mouth every 6 (six) hours as needed for headache (pain).    ? albuterol (VENTOLIN HFA) 108 (90 Base) MCG/ACT inhaler Inhale 2 puffs into the lungs every 6 (six) hours as needed for wheezing or shortness of breath. 8 g 3  ? aspirin 81 MG chewable tablet Chew 1 tablet (81 mg total) by mouth daily. 30 tablet 1  ? Budeson-Glycopyrrol-Formoterol (BREZTRI AEROSPHERE) 160-9-4.8 MCG/ACT AERO Inhale 2 puffs into the lungs daily. 5.9 g 0  ? diltiazem (CARDIZEM CD) 240 MG 24 hr capsule TAKE 1 CAPSULE BY MOUTH EVERY DAY 30 capsule 2  ? furosemide (LASIX) 20 MG tablet Take 1.5 tablets (30 mg total) by mouth daily. 135 tablet 3  ? ipratropium-albuterol (DUONEB) 0.5-2.5 (3) MG/3ML SOLN Take 3 mLs by nebulization every 3 (three) hours as needed. 360 mL 11  ? lidocaine-prilocaine (EMLA) cream APPLY TO AFFECTED AREA ONCE AS DIRECTED 30 g 3  ? montelukast (SINGULAIR) 10 MG tablet Take 1 tablet (10 mg total) by mouth at bedtime. 90 tablet 2  ? neomycin-polymyxin b-dexamethasone (MAXITROL) 3.5-10000-0.1 OINT Place into both eyes 2 (two) times daily.    ? OXYGEN Inhale 4 L/min into the lungs continuous.     ? potassium chloride SA (KLOR-CON M20) 20 MEQ tablet TAKE 1/2 TABLET BY MOUTH EVERY DAY. Office visit required for further refills. 45 tablet 0  ? rosuvastatin (CRESTOR) 5 MG tablet Take 1 tablet (5 mg total) by mouth daily.  For cholesterol. 90 tablet 3  ? Spacer/Aero-Holding Chambers (AEROCHAMBER MV) inhaler Use as instructed 1 each 0  ? ?No current facility-administered medications for this visit.  ? ? ?PHYSICAL EXAMINATION: ?ECOG PERFORMANCE STATUS: 1 - Symptomatic but completely ambulatory ? ?Vitals:  ? 11/10/21 1027  ?BP: 132/78  ?Pulse: (!) 102  ?Resp: 19  ?Temp: 97.9 ?F (36.6 ?C)  ?SpO2: 100%  ? ?Filed Weights  ? 11/10/21 1027  ?Weight: 200 lb 4.8 oz (90.9 kg)  ?  ? ?LABORATORY DATA:  ?I have reviewed the data as listed ? ?  Latest Ref Rng & Units 11/09/2021  ?  3:39 PM 04/25/2021  ? 11:53 AM 09/16/2020  ?  2:45 PM  ?CMP  ?Glucose 70 - 99 mg/dL 106   114   116    ?BUN 8 - 23 mg/dL _0 ?Creatinine 0.44 - 1.00 mg/dL 0.70   0.77   0.73    ?Sodium 135 - 145 mmol/L 140   142   136    ?Potassium 3.5 - 5.1 mmol/L 3.8   4.2   3.7    ?Chloride  98 - 111 mmol/L 100   100   97    ?CO2 22 - 32 mmol/L 34   31   31    ?Calcium 8.9 - 10.3 mg/dL 8.7   9.4   9.3    ?Total Protein 6.5 - 8.1 g/dL  7.9   8.0    ?Total Bilirubin 0.3 - 1.2 mg/dL  0.3   0.3    ?Alkaline Phos 38 - 126 U/L  99   83    ?AST 15 - 41 U/L  14   27    ?ALT 0 - 44 U/L  10   31    ? ? ?Lab Results  ?Component Value Date  ? WBC 10.4 09/16/2020  ? HGB 12.2 09/16/2020  ? HCT 38.9 09/16/2020  ? MCV 87.8 09/16/2020  ? PLT 315 09/16/2020  ? NEUTROABS 8.4 (H) 09/16/2020  ? ? ?ASSESSMENT & PLAN:  ?Malignant neoplasm of upper-outer quadrant of left breast in female, estrogen receptor negative ([place]) ?04/08/20: Left lumpectomy [name]): three foci of IDC, grade 3, 3.3cm, 1.0cm, 0.7cm, with high grade DCIS, clear margins, 2 left axillary lymph nodes negative for carcinoma. HER-2 negative (0), ER/PR negative, Ki67 40%. ?    ?Treatment Plan: ?1. Adjuvant chemotherapy with Adriamycin and Cytoxan dose dense ?4 followed by Taxol weekly ?12 (discontinued after cycle 1 because of profound fatigue and generalized failure to thrive along with hypoxia and tachycardia) the first cycle was even  given at a low dose ?2. Followed by adjuvant radiation therapy (patient does not want to receive radiation) ?  ?Severe COPD: 24-hour oxygen ?-------------------------------------------------------------

## 2021-11-11 ENCOUNTER — Encounter: Payer: Self-pay | Admitting: Medical

## 2021-11-11 NOTE — Progress Notes (Signed)
Unable to contact patient to schedule echocardiogram, letter sent, order cancelled.

## 2021-11-15 ENCOUNTER — Inpatient Hospital Stay: Payer: BC Managed Care – PPO | Attending: Internal Medicine

## 2021-11-15 ENCOUNTER — Ambulatory Visit (HOSPITAL_COMMUNITY)
Admission: RE | Admit: 2021-11-15 | Discharge: 2021-11-15 | Disposition: A | Discharge status: Home / Self Care | Payer: BC Managed Care – PPO | Source: Ambulatory Visit | Attending: Physician Assistant | Admitting: Physician Assistant

## 2021-11-15 ENCOUNTER — Other Ambulatory Visit: Payer: Self-pay

## 2021-11-15 DIAGNOSIS — C3490 Malignant neoplasm of unspecified part of unspecified bronchus or lung: Secondary | ICD-10-CM | POA: Diagnosis not present

## 2021-11-15 DIAGNOSIS — C3432 Malignant neoplasm of lower lobe, left bronchus or lung: Secondary | ICD-10-CM

## 2021-11-15 DIAGNOSIS — J439 Emphysema, unspecified: Secondary | ICD-10-CM | POA: Diagnosis not present

## 2021-11-15 DIAGNOSIS — C50812 Malignant neoplasm of overlapping sites of left female breast: Secondary | ICD-10-CM | POA: Insufficient documentation

## 2021-11-15 DIAGNOSIS — R918 Other nonspecific abnormal finding of lung field: Secondary | ICD-10-CM | POA: Diagnosis not present

## 2021-11-15 DIAGNOSIS — J90 Pleural effusion, not elsewhere classified: Secondary | ICD-10-CM | POA: Diagnosis not present

## 2021-11-15 DIAGNOSIS — Z79899 Other long term (current) drug therapy: Secondary | ICD-10-CM | POA: Insufficient documentation

## 2021-11-15 DIAGNOSIS — Z95828 Presence of other vascular implants and grafts: Secondary | ICD-10-CM

## 2021-11-15 DIAGNOSIS — Z79811 Long term (current) use of aromatase inhibitors: Secondary | ICD-10-CM | POA: Insufficient documentation

## 2021-11-15 LAB — CBC WITH DIFFERENTIAL (CANCER CENTER ONLY)
Abs Immature Granulocytes: 0.03 10*3/uL (ref 0.00–0.07)
Basophils Absolute: 0.1 10*3/uL (ref 0.0–0.1)
Basophils Relative: 1 %
Eosinophils Absolute: 0.2 10*3/uL (ref 0.0–0.5)
Eosinophils Relative: 1 %
HCT: 39.7 % (ref 36.0–46.0)
Hemoglobin: 12 g/dL (ref 12.0–15.0)
Immature Granulocytes: 0 %
Lymphocytes Relative: 10 %
Lymphs Abs: 1.2 10*3/uL (ref 0.7–4.0)
MCH: 25.6 pg — ABNORMAL LOW (ref 26.0–34.0)
MCHC: 30.2 g/dL (ref 30.0–36.0)
MCV: 84.6 fL (ref 80.0–100.0)
Monocytes Absolute: 0.8 10*3/uL (ref 0.1–1.0)
Monocytes Relative: 6 %
Neutro Abs: 10.4 10*3/uL — ABNORMAL HIGH (ref 1.7–7.7)
Neutrophils Relative %: 82 %
Platelet Count: 279 10*3/uL (ref 150–400)
RBC: 4.69 MIL/uL (ref 3.87–5.11)
RDW: 15.8 % — ABNORMAL HIGH (ref 11.5–15.5)
WBC Count: 12.7 10*3/uL — ABNORMAL HIGH (ref 4.0–10.5)
nRBC: 0 % (ref 0.0–0.2)

## 2021-11-15 LAB — CMP (CANCER CENTER ONLY)
ALT: 9 U/L (ref 0–44)
AST: 11 U/L — ABNORMAL LOW (ref 15–41)
Albumin: 3.7 g/dL (ref 3.5–5.0)
Alkaline Phosphatase: 89 U/L (ref 38–126)
Anion gap: 4 — ABNORMAL LOW (ref 5–15)
BUN: 12 mg/dL (ref 8–23)
CO2: 37 mmol/L — ABNORMAL HIGH (ref 22–32)
Calcium: 9 mg/dL (ref 8.9–10.3)
Chloride: 97 mmol/L — ABNORMAL LOW (ref 98–111)
Creatinine: 0.59 mg/dL (ref 0.44–1.00)
GFR, Estimated: >60 mL/min (ref >60)
Glucose, Bld: 120 mg/dL — ABNORMAL HIGH (ref 70–99)
Potassium: 3.9 mmol/L (ref 3.5–5.1)
Sodium: 138 mmol/L (ref 135–145)
Total Bilirubin: 0.3 mg/dL (ref 0.3–1.2)
Total Protein: 7.5 g/dL (ref 6.5–8.1)

## 2021-11-15 MED ORDER — SODIUM CHLORIDE (PF) 0.9 % IJ SOLN
INTRAMUSCULAR | Status: AC
  Filled 2021-11-15: qty 50

## 2021-11-15 MED ORDER — SODIUM CHLORIDE 0.9% FLUSH
10.0000 mL | Freq: Once | INTRAVENOUS | Status: AC
  Administered 2021-11-15: 10 mL

## 2021-11-15 MED ORDER — IOHEXOL 300 MG/ML  SOLN
75.0000 mL | Freq: Once | INTRAMUSCULAR | Status: AC | PRN
  Administered 2021-11-15: 75 mL via INTRAVENOUS

## 2021-11-15 MED ORDER — HEPARIN SOD (PORK) LOCK FLUSH 100 UNIT/ML IV SOLN
500.0000 [IU] | Freq: Once | INTRAVENOUS | Status: AC
  Administered 2021-11-15: 500 [IU] via INTRAVENOUS

## 2021-11-17 ENCOUNTER — Inpatient Hospital Stay: Payer: BC Managed Care – PPO | Admitting: Internal Medicine

## 2021-11-17 ENCOUNTER — Other Ambulatory Visit: Payer: Self-pay

## 2021-11-17 VITALS — BP 113/73 | HR 116 | Temp 98.9°F | Resp 16 | Wt 196.6 lb

## 2021-11-17 DIAGNOSIS — C3490 Malignant neoplasm of unspecified part of unspecified bronchus or lung: Secondary | ICD-10-CM

## 2021-11-17 DIAGNOSIS — C50812 Malignant neoplasm of overlapping sites of left female breast: Secondary | ICD-10-CM | POA: Diagnosis not present

## 2021-11-17 DIAGNOSIS — C3432 Malignant neoplasm of lower lobe, left bronchus or lung: Secondary | ICD-10-CM

## 2021-11-17 DIAGNOSIS — Z79811 Long term (current) use of aromatase inhibitors: Secondary | ICD-10-CM | POA: Diagnosis not present

## 2021-11-17 DIAGNOSIS — Z79899 Other long term (current) drug therapy: Secondary | ICD-10-CM | POA: Diagnosis not present

## 2021-11-17 NOTE — Progress Notes (Signed)
?    [place] ?[phone]   [phone] ? ?OFFICE PROGRESS NOTE ? ?Pleas Koch, NP ?[place] [address] ? ?DIAGNOSIS:  ?1) stage IIIB (T3, N2, M0) non-small cell lung cancer, squamous cell carcinoma presented with large left lower lobe lung mass in addition to mediastinal lymphadenopathy diagnosed in October 2019. ?PDL 1 expression is negative ?2) recurrent breast cancer initially diagnosed as stage IIb (T2, N0, M0) left breast invasive ductal carcinoma involving the upper outer quadrant of the left breast diagnosed in August 2021. ? ?PRIOR THERAPY:  ?1) Concurrent chemoradiation with weekly carboplatin for AUC of 2 and paclitaxel 45 mg/M2.  Status post 7 cycles with partial response. ?2) Consolidation treatment with immunotherapy with Imfinzi 10 mg/KG every 2 weeks.  First dose July 31, 2018.  Status post 26 cycles. ?3) status post left lumpectomy followed by adjuvant chemotherapy with Adriamycin, Cytoxan and Taxol ? ?CURRENT THERAPY: Femara 2.5 mg p.o. daily under the care of Dr. [name] started on November 11, 2021 ? ?INTERVAL HISTORY: ?Deborah [name] 66 y.o. female returns to the clinic today for follow-up visit accompanied by her husband.  The patient is feeling fine today with no concerning complaints except for the baseline shortness of breath and wheezing.  She is currently on home oxygen.  She was found recently to have recurrent left breast carcinoma but she is not a great candidate for surgical resection.  She was seen by Dr. [name] and started treatment with Femara 2.5 mg p.o. daily a week ago.  She is tolerating her treatment well so far.  She denied having any chest pain but has cough with no hemoptysis.  She has no nausea, vomiting, diarrhea or constipation.  She has no headache or visual changes.  She is here today for evaluation and repeat CT scan of the chest for restaging of her disease. ? ?MEDICAL HISTORY: ?Past Medical History:   ?Diagnosis Date  ? Anemia   ? as teen  ? Asthma   ? Breast cancer ([place])   ? left breast cancer 02/2020  ? CHF (congestive heart failure) ([place])   ? COPD, mild ([place])   ? Depression   ? Diverticulitis   ? Family history of anesthesia complication   ? vomiting  ? GI bleeding   ? Heart failure ([place])   ? New onset 07/25/14  ? Histoplasmosis   ? left eye  ? Hyperkalemia   ? Hypertension   ? Lung cancer ([place]) 05/02/2018  ? s/p chemoradiation, immunotherapy  ? Obesity (BMI 30-39.9)   ? Pneumonia   ? dx wtih pneumonia on 05/27/16- seen by [facility]   ? PONV (postoperative nausea and vomiting)   ? Restrictive lung disease   ? Shortness of breath   ? with exertion   ? Sleep apnea   ? mask and oxygen at nite for sleep at 2L   ? Tobacco abuse   ? Umbilical hernia   ? ? ?ALLERGIES:  has No Known Allergies. ? ?MEDICATIONS:  ?Current Outpatient Medications  ?Medication Sig Dispense Refill  ? acetaminophen (TYLENOL) 325 MG tablet Take 650 mg by mouth every 6 (six) hours as needed for headache (pain).    ? albuterol (VENTOLIN HFA) 108 (90 Base) MCG/ACT inhaler Inhale 2 puffs into the lungs every 6 (six) hours as needed for wheezing or shortness of breath. 8 g 3  ? aspirin 81 MG chewable tablet Chew 1 tablet (81 mg total) by mouth  daily. 30 tablet 1  ? Budeson-Glycopyrrol-Formoterol (BREZTRI AEROSPHERE) 160-9-4.8 MCG/ACT AERO Inhale 2 puffs into the lungs daily. 5.9 g 0  ? diltiazem (CARDIZEM CD) 240 MG 24 hr capsule TAKE 1 CAPSULE BY MOUTH EVERY DAY 30 capsule 2  ? furosemide (LASIX) 20 MG tablet Take 1.5 tablets (30 mg total) by mouth daily. 135 tablet 3  ? ipratropium-albuterol (DUONEB) 0.5-2.5 (3) MG/3ML SOLN Take 3 mLs by nebulization every 3 (three) hours as needed. 360 mL 11  ? letrozole (FEMARA) 2.5 MG tablet Take 1 tablet (2.5 mg total) by mouth daily. 90 tablet 3  ? lidocaine-prilocaine (EMLA) cream APPLY TO AFFECTED AREA ONCE AS DIRECTED 30 g 3  ? montelukast (SINGULAIR) 10 MG tablet Take 1 tablet (10 mg total) by mouth at  bedtime. 90 tablet 2  ? neomycin-polymyxin b-dexamethasone (MAXITROL) 3.5-10000-0.1 OINT Place into both eyes 2 (two) times daily.    ? OXYGEN Inhale 4 L/min into the lungs continuous.     ? potassium chloride SA (KLOR-CON M20) 20 MEQ tablet TAKE 1/2 TABLET BY MOUTH EVERY DAY. Office visit required for further refills. 45 tablet 0  ? rosuvastatin (CRESTOR) 5 MG tablet Take 1 tablet (5 mg total) by mouth daily. For cholesterol. 90 tablet 3  ? Spacer/Aero-Holding Chambers (AEROCHAMBER MV) inhaler Use as instructed 1 each 0  ? ?No current facility-administered medications for this visit.  ? ? ?SURGICAL HISTORY:  ?Past Surgical History:  ?Procedure Laterality Date  ? APPLICATION OF WOUND VAC  03/19/2013  ? Procedure: APPLICATION OF WOUND VAC;  Surgeon: Madilyn Hook, DO;  [facility];  Service: General;;  ? BREAST BIOPSY Left 09/29/2021  ? BREAST BIOPSY Left 10/18/2021  ? BREAST LUMPECTOMY Left 04/08/2020  ? BREAST LUMPECTOMY WITH RADIOACTIVE SEED AND SENTINEL LYMPH NODE BIOPSY Left 04/08/2020  ? Procedure: LEFT BREAST LUMPECTOMY WITH RADIOACTIVE SEED AND LEFT AXILLARY SENTINEL LYMPH NODE BIOPSY;  Surgeon: Alphonsa Overall, MD;  Location: [employer];  Service: General;  Laterality: Left;  PEC BLOCK, NEEDS ANESTHESIA CONSULT  ? CESAREAN SECTION  04/12/1985  ? COLONOSCOPY WITH PROPOFOL N/A 06/13/2016  ? Procedure: COLONOSCOPY WITH PROPOFOL;  Surgeon: Jerene Bears, MD;  [facility];  Service: Gastroenterology;  Laterality: N/A;  ? ERCP N/A 02/15/2017  ? Procedure: ENDOSCOPIC RETROGRADE CHOLANGIOPANCREATOGRAPHY (ERCP);  Surgeon: [name], MD;  Location[facility];  Service: Endoscopy;  Laterality: N/A;  ? INSERTION OF MESH N/A 03/19/2013  ? Procedure: INSERTION OF MESH;  Surgeon: Madilyn Hook, DO;  [facility];  Service: General;  Laterality: N/A;  ? IR IMAGING GUIDED PORT INSERTION  03/26/2020  ? IR THORACENTESIS ASP PLEURAL SPACE W/IMG GUIDE  05/25/2020  ? VENTRAL HERNIA REPAIR N/A 03/19/2013  ?  Procedure:  OPEN VENTRAL HERNIA REPAIR WITH MESH AND APPLICATION OF WOUND VAC;  Surgeon: Madilyn Hook, DO;  [facility];  Service: General;  Laterality: N/A;  ? VIDEO BRONCHOSCOPY Bilateral 04/19/2018  ? Procedure: VIDEO BRONCHOSCOPY WITH FLUORO;  Surgeon: Rigoberto Noel, MD;  Location: [facility];  Service: Cardiopulmonary;  Laterality: Bilateral;  ? ? ?REVIEW OF SYSTEMS:  A comprehensive review of systems was negative except for: Constitutional: positive for fatigue ?Respiratory: positive for dyspnea on exertion and wheezing  ? ?PHYSICAL EXAMINATION: General appearance: alert, cooperative, fatigued, and no distress ?Head: Normocephalic, without obvious abnormality, atraumatic ?Neck: no adenopathy, no JVD, supple, symmetrical, trachea midline, and thyroid not enlarged, symmetric, no tenderness/mass/nodules ?Lymph nodes: Cervical, supraclavicular, and axillary nodes normal. ?Resp: wheezes bilaterally ?Back: symmetric, no  curvature. ROM normal. No CVA tenderness. ?Cardio: regular rate and rhythm, S1, S2 normal, no murmur, click, rub or gallop ?GI: soft, non-tender; bowel sounds normal; no masses,  no organomegaly ?Extremities: extremities normal, atraumatic, no cyanosis or edema ? ?ECOG PERFORMANCE STATUS: 1 - Symptomatic but completely ambulatory ? ?Blood pressure 113/73, pulse (!) 116, temperature 98.9 ?F (37.2 ?C), temperature source Tympanic, resp. rate 16, weight 196 lb 9 oz (89.2 kg), SpO2 97 %. ? ?LABORATORY DATA: ?Lab Results  ?Component Value Date  ? WBC 12.7 (H) 11/15/2021  ? HGB 12.0 11/15/2021  ? HCT 39.7 11/15/2021  ? MCV 84.6 11/15/2021  ? PLT 279 11/15/2021  ? ? ?  Chemistry   ?   ?Component Value Date/Time  ? NA 138 11/15/2021 1026  ? NA 143 11/23/2016 1504  ? K 3.9 11/15/2021 1026  ? CL 97 (L) 11/15/2021 1026  ? CO2 37 (H) 11/15/2021 1026  ? BUN 12 11/15/2021 1026  ? BUN 14 11/23/2016 1504  ? CREATININE 0.59 11/15/2021 1026  ?    ?Component Value Date/Time  ? CALCIUM 9.0 11/15/2021 1026  ?  ALKPHOS 89 11/15/2021 1026  ? AST 11 (L) 11/15/2021 1026  ? ALT 9 11/15/2021 1026  ? BILITOT 0.3 11/15/2021 1026  ?  ? ? ? ?RADIOGRAPHIC STUDIES: ?CT Chest W Contrast ? ?Result Date: 11/16/2021 ?CLINICAL DATA:  Non

## 2021-11-21 ENCOUNTER — Encounter: Payer: Self-pay | Admitting: *Deleted

## 2021-11-22 ENCOUNTER — Encounter: Payer: Self-pay | Admitting: Hematology and Oncology

## 2021-11-27 ENCOUNTER — Other Ambulatory Visit: Payer: Self-pay | Admitting: Cardiovascular Disease

## 2021-12-07 ENCOUNTER — Other Ambulatory Visit: Payer: Self-pay | Admitting: Primary Care

## 2021-12-07 DIAGNOSIS — I700 Atherosclerosis of aorta: Secondary | ICD-10-CM

## 2021-12-07 DIAGNOSIS — E785 Hyperlipidemia, unspecified: Secondary | ICD-10-CM

## 2021-12-21 ENCOUNTER — Encounter: Payer: Self-pay | Admitting: Hematology and Oncology

## 2021-12-21 ENCOUNTER — Ambulatory Visit: Payer: BC Managed Care – PPO | Admitting: Primary Care

## 2021-12-26 ENCOUNTER — Inpatient Hospital Stay: Payer: BC Managed Care – PPO | Attending: Internal Medicine

## 2021-12-29 ENCOUNTER — Other Ambulatory Visit: Payer: Self-pay | Admitting: Primary Care

## 2021-12-29 DIAGNOSIS — E785 Hyperlipidemia, unspecified: Secondary | ICD-10-CM

## 2021-12-29 DIAGNOSIS — I700 Atherosclerosis of aorta: Secondary | ICD-10-CM

## 2022-01-13 ENCOUNTER — Ambulatory Visit: Payer: BC Managed Care – PPO | Admitting: Pulmonary Disease

## 2022-01-26 ENCOUNTER — Other Ambulatory Visit: Payer: Self-pay | Admitting: Primary Care

## 2022-01-26 DIAGNOSIS — E876 Hypokalemia: Secondary | ICD-10-CM

## 2022-01-31 ENCOUNTER — Other Ambulatory Visit: Payer: Self-pay | Admitting: Primary Care

## 2022-01-31 DIAGNOSIS — I700 Atherosclerosis of aorta: Secondary | ICD-10-CM

## 2022-01-31 DIAGNOSIS — E785 Hyperlipidemia, unspecified: Secondary | ICD-10-CM

## 2022-02-02 ENCOUNTER — Ambulatory Visit: Payer: BC Managed Care – PPO | Admitting: Primary Care

## 2022-02-02 ENCOUNTER — Encounter: Payer: Self-pay | Admitting: Primary Care

## 2022-02-02 VITALS — BP 110/68 | HR 110 | Temp 98.6°F | Ht 63.0 in | Wt 195.0 lb

## 2022-02-02 DIAGNOSIS — E785 Hyperlipidemia, unspecified: Secondary | ICD-10-CM | POA: Diagnosis not present

## 2022-02-02 DIAGNOSIS — J984 Other disorders of lung: Secondary | ICD-10-CM

## 2022-02-02 DIAGNOSIS — I700 Atherosclerosis of aorta: Secondary | ICD-10-CM

## 2022-02-02 DIAGNOSIS — Z0000 Encounter for general adult medical examination without abnormal findings: Secondary | ICD-10-CM

## 2022-02-02 DIAGNOSIS — J432 Centrilobular emphysema: Secondary | ICD-10-CM

## 2022-02-02 DIAGNOSIS — C3432 Malignant neoplasm of lower lobe, left bronchus or lung: Secondary | ICD-10-CM

## 2022-02-02 DIAGNOSIS — R7303 Prediabetes: Secondary | ICD-10-CM | POA: Diagnosis not present

## 2022-02-02 DIAGNOSIS — Z171 Estrogen receptor negative status [ER-]: Secondary | ICD-10-CM

## 2022-02-02 DIAGNOSIS — I5032 Chronic diastolic (congestive) heart failure: Secondary | ICD-10-CM

## 2022-02-02 DIAGNOSIS — I10 Essential (primary) hypertension: Secondary | ICD-10-CM

## 2022-02-02 DIAGNOSIS — C50412 Malignant neoplasm of upper-outer quadrant of left female breast: Secondary | ICD-10-CM

## 2022-02-02 DIAGNOSIS — F32A Depression, unspecified: Secondary | ICD-10-CM

## 2022-02-02 DIAGNOSIS — G4733 Obstructive sleep apnea (adult) (pediatric): Secondary | ICD-10-CM

## 2022-02-02 DIAGNOSIS — J9611 Chronic respiratory failure with hypoxia: Secondary | ICD-10-CM

## 2022-02-02 MED ORDER — POTASSIUM CHLORIDE CRYS ER 10 MEQ PO TBCR: 10.0000 meq | EXTENDED_RELEASE_TABLET | Freq: Every day | ORAL | 3 refills | Status: DC

## 2022-02-02 MED ORDER — MONTELUKAST SODIUM 10 MG PO TABS: 10.0000 mg | ORAL_TABLET | Freq: Every day | ORAL | 3 refills | Status: DC

## 2022-02-02 MED ORDER — ROSUVASTATIN CALCIUM 5 MG PO TABS: ORAL_TABLET | ORAL | 3 refills | Status: DC

## 2022-02-02 NOTE — Assessment & Plan Note (Signed)
Continue CPAP nightly. °

## 2022-02-02 NOTE — Assessment & Plan Note (Signed)
Repeat A1c pending. 

## 2022-02-02 NOTE — Assessment & Plan Note (Signed)
Following with pulmonology and oncology.  Continue supplemental oxygen daily.  Recommended she discuss maintenance inhaler options with her pulmonologist as she refuses to use her Breztri daily. Continue albuterol inhaler as needed, continue DuoNeb treatments as needed.

## 2022-02-02 NOTE — Assessment & Plan Note (Signed)
Following with oncology. Office notes reviewed from May 2023. CT scan chest reviewed from May 2023.

## 2022-02-02 NOTE — Assessment & Plan Note (Signed)
Vaccines up-to-date.  Declines Pap smear, colonoscopy despite recommendations. Mammogram up-to-date.  Encouraged healthy diet and daily walking.  Exam today stable, labs pending.

## 2022-02-02 NOTE — Assessment & Plan Note (Signed)
Controlled.  Continue diltiazem 240 mg daily.

## 2022-02-02 NOTE — Assessment & Plan Note (Signed)
Continue rosuvastatin 5 mg daily. Repeat lipid panel pending 

## 2022-02-02 NOTE — Assessment & Plan Note (Signed)
Following with oncology.  Not a surgical candidate.  Reviewed office notes from April 2023. Continue letrozole 2.5 mg daily.

## 2022-02-02 NOTE — Progress Notes (Signed)
Subjective:    Patient ID: Deborah [name], female    DOB: [birth year], 66 y.o.   MRN: [id number]  HPI  Deborah [name] is a very pleasant 66 y.o. female who presents today for follow up of chronic conditions and for complete physical.  Immunizations: -Tetanus: 2019 -Influenza: Completed last season  -Covid-19: 3 vaccines -Shingles: Completed Shingrix -Pneumonia: Prevnar 13 in 2018, pneumovax in 2016  Diet: [place].  Exercise: No regular exercise.  Eye exam: Completed 3 years ago. Dental exam: Completed several years ago  Pap Smear: Completed years ago. Declines pap smear today.  Mammogram: Completed in April 2023  Colonoscopy: Completed in 2017, due 2018 and hasn't completed. She declines and does not want this done.      1) CHF/Hypertension/OSA/Hyperlipidemia: Currently managed on diltiazem to 40 mg daily, furosemide 20 mg daily, rosuvastatin 5 mg daily, aspirin 81 mg daily, potassium 10 mEq daily.  Following with cardiology, last office visit was in October 2022. She denies chest pain.   2) COPD/Chronic Respiratory Failure/Lung Cancer: Currently managed on Breztri 160-9-4.8 inhaler daily and albuterol inhaler as needed.  Also on continuous home oxygen.  History of non-small cell lung cancer with large left lower lobe mass in October 2019 and left breast invasive ductal carcinoma in August 2021.  Following with oncology, last office visit was in May 2023 for lung cancer follow-up.recommendations were for continued CT chest monitoring.    Following with oncology for breast cancer and currently managed on Femara 2.5 mg daily.  Last office visit was in April 2023.  She is not a surgical candidate.  She experiences exertional dyspnea with mild activity which is normal for her.   She has not been using Breztri as she doesn't like to rinse after each use. She is using her albuterol inhaler rarely. She is using her Duonebs PRN during cooler months.      Review of Systems   Constitutional:  Negative for unexpected weight change.  HENT:  Negative for rhinorrhea.   Eyes:  Negative for visual disturbance.  Respiratory:  Positive for shortness of breath. Negative for cough.   Cardiovascular:  Negative for chest pain.  Gastrointestinal:  Negative for constipation and diarrhea.  Genitourinary:  Negative for difficulty urinating.  Musculoskeletal:  Negative for arthralgias and myalgias.  Skin:  Negative for rash.  Allergic/Immunologic: Negative for environmental allergies.  Neurological:  Negative for dizziness and headaches.  Psychiatric/Behavioral:  The patient is not nervous/anxious.          Past Medical History:  Diagnosis Date   Anemia    as teen   Asthma    Breast cancer ([place])    left breast cancer 02/2020   CHF (congestive heart failure) ([facility])    COPD, mild ([facility])    Depression    Diverticulitis    Family history of anesthesia complication    vomiting   GI bleeding    Heart failure ([place])    New onset 07/25/14   Histoplasmosis    left eye   Hyperkalemia    Hypertension    Lung cancer ([place]) 05/02/2018   s/p chemoradiation, immunotherapy   Obesity (BMI 30-39.9)    Pneumonia    dx wtih pneumonia on 05/27/16- seen by [facility]    PONV (postoperative nausea and vomiting)    Restrictive lung disease    Shortness of breath    with exertion    Sleep apnea    mask and oxygen at nite for sleep at  2L    Tobacco abuse    Umbilical hernia     Social History   Socioeconomic History   Marital status: Married    Spouse name: Not on file   Number of children: 1   Years of education: Not on file   Highest education level: Not on file  Occupational History   Occupation: Retired    Fish farm manager: NOT EMPLOYED  Tobacco Use   Smoking status: Former    Packs/day: 3.00    Years: 43.00    Total pack years: 129.00    Types: Cigarettes    Quit date: 12/17/2012    Years since quitting: 9.1   Smokeless tobacco: Never  Vaping Use   Vaping Use: Former   Substance and Sexual Activity   Alcohol use: No    Alcohol/week: 0.0 standard drinks of alcohol   Drug use: No   Sexual activity: Not on file  Other Topics Concern   Not on file  Social History Narrative   ** Merged History Encounter **       ** Data from: 07/25/14 Enc Dept: [facility]       ** Data from: 06/05/13 Enc Dept: [facility]   Married, one son 37 yo.            Social Determinants of Health   Financial Resource Strain: Not on file  Food Insecurity: Not on file  Transportation Needs: Unmet Transportation Needs (05/02/2018)   PRAPARE - Hydrologist (Medical): Yes    Lack of Transportation (Non-Medical): No  Physical Activity: Not on file  Stress: Not on file  Social Connections: Not on file  Intimate Partner Violence: Not on file    Past Surgical History:  Procedure Laterality Date   APPLICATION OF WOUND VAC  03/19/2013   Procedure: APPLICATION OF WOUND VAC;  Surgeon: Madilyn Hook, DO;  [facility];  Service: General;;   BREAST BIOPSY Left 09/29/2021   BREAST BIOPSY Left 10/18/2021   BREAST LUMPECTOMY Left 04/08/2020   BREAST LUMPECTOMY WITH RADIOACTIVE SEED AND SENTINEL LYMPH NODE BIOPSY Left 04/08/2020   Procedure: LEFT BREAST LUMPECTOMY WITH RADIOACTIVE SEED AND LEFT AXILLARY SENTINEL LYMPH NODE BIOPSY;  Surgeon: Alphonsa Overall, MD;  Location: [place];  Service: General;  Laterality: Left;  PEC BLOCK, NEEDS ANESTHESIA CONSULT   CESAREAN SECTION  04/12/1985   COLONOSCOPY WITH PROPOFOL N/A 06/13/2016   Procedure: COLONOSCOPY WITH PROPOFOL;  Surgeon: Jerene Bears, MD;  [facility];  Service: Gastroenterology;  Laterality: N/A;   ERCP N/A 02/15/2017   Procedure: ENDOSCOPIC RETROGRADE CHOLANGIOPANCREATOGRAPHY (ERCP);  Surgeon: [name], MD;  [facility];  Service: Endoscopy;  Laterality: N/A;   INSERTION OF MESH N/A 03/19/2013   Procedure: INSERTION OF MESH;  Surgeon: Madilyn Hook, DO;   [facility];  Service: General;  Laterality: N/A;   IR IMAGING GUIDED PORT INSERTION  03/26/2020   IR THORACENTESIS ASP PLEURAL SPACE W/IMG GUIDE  05/25/2020   VENTRAL HERNIA REPAIR N/A 03/19/2013   Procedure:  OPEN VENTRAL HERNIA REPAIR WITH MESH AND APPLICATION OF WOUND VAC;  Surgeon: Madilyn Hook, DO;  [facility];  Service: General;  Laterality: N/A;   VIDEO BRONCHOSCOPY Bilateral 04/19/2018   Procedure: VIDEO BRONCHOSCOPY WITH FLUORO;  Surgeon: Rigoberto Noel, MD;  [facility];  Service: Cardiopulmonary;  Laterality: Bilateral;    Family History  Problem Relation Age of Onset   Heart attack Mother    Emphysema Mother  was a smoker   Congestive Heart Failure Father    Bipolar disorder Father    Lymphoma Maternal Uncle    Pancreatic cancer Maternal Aunt    Bipolar disorder Sister    Schizophrenia Sister    Other Sister        tumor in her neck   Melanoma Sister    Heart attack Maternal Uncle    Heart attack Maternal Uncle    Colon cancer Neg Hx    Breast cancer Neg Hx     No Known Allergies  Current Outpatient Medications on File Prior to Visit  Medication Sig Dispense Refill   acetaminophen (TYLENOL) 325 MG tablet Take 650 mg by mouth every 6 (six) hours as needed for headache (pain).     albuterol (VENTOLIN HFA) 108 (90 Base) MCG/ACT inhaler Inhale 2 puffs into the lungs every 6 (six) hours as needed for wheezing or shortness of breath. 8 g 3   aspirin 81 MG chewable tablet Chew 1 tablet (81 mg total) by mouth daily. 30 tablet 1   Budeson-Glycopyrrol-Formoterol (BREZTRI AEROSPHERE) 160-9-4.8 MCG/ACT AERO Inhale 2 puffs into the lungs daily. 5.9 g 0   Cholecalciferol (VITAMIN D3) 1.25 MG (50000 UT) TABS Take 1 tablet by mouth daily.     diltiazem (CARDIZEM CD) 240 MG 24 hr capsule TAKE 1 CAPSULE BY MOUTH EVERY DAY 90 capsule 1   furosemide (LASIX) 20 MG tablet Take 1.5 tablets (30 mg total) by mouth daily. 135 tablet 3   ipratropium-albuterol  (DUONEB) 0.5-2.5 (3) MG/3ML SOLN Take 3 mLs by nebulization every 3 (three) hours as needed. 360 mL 11   letrozole (FEMARA) 2.5 MG tablet Take 1 tablet (2.5 mg total) by mouth daily. 90 tablet 3   lidocaine-prilocaine (EMLA) cream APPLY TO AFFECTED AREA ONCE AS DIRECTED 30 g 3   OXYGEN Inhale 4 L/min into the lungs continuous.      Spacer/Aero-Holding Chambers (AEROCHAMBER MV) inhaler Use as instructed 1 each 0   [DISCONTINUED] prochlorperazine (COMPAZINE) 10 MG tablet Take 1 tablet (10 mg total) by mouth every 6 (six) hours as needed (Nausea or vomiting). 30 tablet 1   No current facility-administered medications on file prior to visit.    BP 110/68   Pulse (!) 110   Temp 98.6 F (37 C) (Oral)   Ht 5\' 3"  (1.6 m)   Wt 195 lb (88.5 kg)   SpO2 95% Comment: 2 L of o2 in office  BMI 34.54 kg/m  Objective:   Physical Exam HENT:     Right Ear: Tympanic membrane and ear canal normal.     Left Ear: Tympanic membrane and ear canal normal.     Nose: Nose normal.  Eyes:     Conjunctiva/sclera: Conjunctivae normal.     Pupils: Pupils are equal, round, and reactive to light.  Neck:     Thyroid: No thyromegaly.  Cardiovascular:     Rate and Rhythm: Normal rate and regular rhythm.     Heart sounds: No murmur heard. Pulmonary:     Effort: Pulmonary effort is normal.     Breath sounds: Wheezing present.     Comments: Supplemental oxygen in place Abdominal:     General: Bowel sounds are normal.     Palpations: Abdomen is soft.     Tenderness: There is no abdominal tenderness.  Musculoskeletal:        General: Normal range of motion.     Cervical back: Neck supple.  Lymphadenopathy:  Cervical: No cervical adenopathy.  Skin:    General: Skin is warm and dry.     Findings: No rash.  Neurological:     Mental Status: She is alert and oriented to person, place, and time.     Cranial Nerves: No cranial nerve deficit.     Deep Tendon Reflexes: Reflexes are normal and symmetric.   Psychiatric:        Mood and Affect: Mood normal.           Assessment & Plan:   Problem List Items Addressed This Visit       Cardiovascular and Mediastinum   Chronic diastolic heart failure ([place]) (Chronic)    Following with cardiology, office notes reviewed from October 2022.  Continue furosemide 20 mg daily, diltiazem to 40 mg daily, potassium chloride 10 mEq daily. CMP reviewed from May 2023      Relevant Medications   potassium chloride (KLOR-CON M) 10 MEQ tablet   rosuvastatin (CRESTOR) 5 MG tablet   Hypertension    Controlled.  Continue diltiazem 240 mg daily.      Relevant Medications   rosuvastatin (CRESTOR) 5 MG tablet     Respiratory   Chronic respiratory failure [facility])    Following with pulmonology and oncology.  Continue supplemental oxygen daily.  Recommended she discuss maintenance inhaler options with her pulmonologist as she refuses to use her Breztri daily. Continue albuterol inhaler as needed, continue DuoNeb treatments as needed.      CRLD (chronic restrictive lung disease) secondary to super morbid obesity with mild COPD component   Relevant Medications   montelukast (SINGULAIR) 10 MG tablet   OSA (obstructive sleep apnea)    Continue CPAP nightly.      COPD (chronic obstructive pulmonary disease) [facility])    Following with pulmonology and oncology.  Continue supplemental oxygen daily.  Recommended she discuss maintenance inhaler options with her pulmonologist as she refuses to use her Breztri daily. Continue albuterol inhaler as needed, continue DuoNeb treatments as needed.      Relevant Medications   montelukast (SINGULAIR) 10 MG tablet   Primary malignant neoplasm of bronchus of left lower lobe [facility])    Following with oncology. Office notes reviewed from May 2023. CT scan chest reviewed from May 2023.         Other   Depression    Stable.  Patient declines the need for intervention.      Malignant neoplasm of  upper-outer quadrant of left breast in female, estrogen receptor negative [facility])    Following with oncology.  Not a surgical candidate.  Reviewed office notes from April 2023. Continue letrozole 2.5 mg daily.      Prediabetes    Repeat A1c pending.      Relevant Orders   Hemoglobin A1c   Preventative health care - Primary    Vaccines up-to-date.  Declines Pap smear, colonoscopy despite recommendations. Mammogram up-to-date.  Encouraged healthy diet and daily walking.  Exam today stable, labs pending.      Hyperlipidemia    Continue rosuvastatin 5 mg daily. Repeat lipid panel pending.      Relevant Medications   rosuvastatin (CRESTOR) 5 MG tablet   Other Relevant Orders   Lipid panel   Other Visit Diagnoses     Aortic atherosclerosis ([place])       Relevant Medications   rosuvastatin (CRESTOR) 5 MG tablet          Pleas Koch, NP

## 2022-02-02 NOTE — Assessment & Plan Note (Signed)
Stable.  Patient declines the need for intervention.

## 2022-02-02 NOTE — Assessment & Plan Note (Signed)
Following with cardiology, office notes reviewed from October 2022.  Continue furosemide 20 mg daily, diltiazem to 40 mg daily, potassium chloride 10 mEq daily. CMP reviewed from May 2023

## 2022-02-02 NOTE — Patient Instructions (Addendum)
Stop by the lab prior to leavingPreventive Care 64-64 Years Old, Female Preventive care refers to lifestyle choices and visits with your health care provider that can promote health and wellness. Preventive care visits are also called wellness exams. What can I expect for my preventive care visit? Counseling Your health care provider may ask you questions about your: Medical history, including: Past medical problems. Family medical history. Pregnancy history. Current health, including: Menstrual cycle. Method of birth control. Emotional well-being. Home life and relationship well-being. Sexual activity and sexual health. Lifestyle, including: Alcohol, nicotine or tobacco, and drug use. Access to firearms. Diet, exercise, and sleep habits. Work and work Statistician. Sunscreen use. Safety issues such as seatbelt and bike helmet use. Physical exam Your health care provider will check your: Height and weight. These may be used to calculate your BMI (body mass index). BMI is a measurement that tells if you are at a healthy weight. Waist circumference. This measures the distance around your waistline. This measurement also tells if you are at a healthy weight and may help predict your risk of certain diseases, such as type 2 diabetes and high blood pressure. Heart rate and blood pressure. Body temperature. Skin for abnormal spots. What immunizations do I need?  Vaccines are usually given at various ages, according to a schedule. Your health care provider will recommend vaccines for you based on your age, medical history, and lifestyle or other factors, such as travel or where you work. What tests do I need? Screening Your health care provider may recommend screening tests for certain conditions. This may include: Lipid and cholesterol levels. Diabetes screening. This is done by checking your blood sugar (glucose) after you have not eaten for a while (fasting). Pelvic exam and Pap  test. Hepatitis B test. Hepatitis C test. HIV (human immunodeficiency virus) test. STI (sexually transmitted infection) testing, if you are at risk. Lung cancer screening. Colorectal cancer screening. Mammogram. Talk with your health care provider about when you should start having regular mammograms. This may depend on whether you have a family history of breast cancer. BRCA-related cancer screening. This may be done if you have a family history of breast, ovarian, tubal, or peritoneal cancers. Bone density scan. This is done to screen for osteoporosis. Talk with your health care provider about your test results, treatment options, and if necessary, the need for more tests. Follow these instructions at home: Eating and drinking  Eat a diet that includes fresh fruits and vegetables, whole grains, lean protein, and low-fat dairy products. Take vitamin and mineral supplements as recommended by your health care provider. Do not drink alcohol if: Your health care provider tells you not to drink. You are pregnant, may be pregnant, or are planning to become pregnant. If you drink alcohol: Limit how much you have to 0-1 drink a day. Know how much alcohol is in your drink. In the [place] one drink equals one 12 oz bottle of beer (355 mL), one 5 oz glass of wine (148 mL), or one 1 oz glass of hard liquor (44 mL). Lifestyle Brush your teeth every morning and night with fluoride toothpaste. Floss one time each day. Exercise for at least 30 minutes 5 or more days each week. Do not use any products that contain nicotine or tobacco. These products include cigarettes, chewing tobacco, and vaping devices, such as e-cigarettes. If you need help quitting, ask your health care provider. Do not use drugs. If you are sexually active, practice safe sex. Use a condom  or other form of protection to prevent STIs. If you do not wish to become pregnant, use a form of birth control. If you plan to become pregnant,  see your health care provider for a prepregnancy visit. Take aspirin only as told by your health care provider. Make sure that you understand how much to take and what form to take. Work with your health care provider to find out whether it is safe and beneficial for you to take aspirin daily. Find healthy ways to manage stress, such as: Meditation, yoga, or listening to music. Journaling. Talking to a trusted person. Spending time with friends and family. Minimize exposure to UV radiation to reduce your risk of skin cancer. Safety Always wear your seat belt while driving or riding in a vehicle. Do not drive: If you have been drinking alcohol. Do not ride with someone who has been drinking. When you are tired or distracted. While texting. If you have been using any mind-altering substances or drugs. Wear a helmet and other protective equipment during sports activities. If you have firearms in your house, make sure you follow all gun safety procedures. Seek help if you have been physically or sexually abused. What's next? Visit your health care provider once a year for an annual wellness visit. Ask your health care provider how often you should have your eyes and teeth checked. Stay up to date on all vaccines. This information is not intended to replace advice given to you by your health care provider. Make sure you discuss any questions you have with your health care provider. Document Revised: 12/29/2020 Document Reviewed: 12/29/2020 Elsevier Patient Education  [place].  today. I will notify you of your results once received.   It was a pleasure to see you today!

## 2022-02-03 LAB — LIPID PANEL
Cholesterol: 139 mg/dL (ref 0–200)
HDL: 42.6 mg/dL (ref >39.00)
LDL Cholesterol: 59 mg/dL (ref 0–99)
NonHDL: 96.33
Total CHOL/HDL Ratio: 3
Triglycerides: 187 mg/dL — ABNORMAL HIGH (ref 0.0–149.0)
VLDL: 37.4 mg/dL (ref 0.0–40.0)

## 2022-02-03 LAB — HEMOGLOBIN A1C: Hgb A1c MFr Bld: 6.5 % (ref 4.6–6.5)

## 2022-02-06 ENCOUNTER — Other Ambulatory Visit: Payer: Self-pay

## 2022-02-06 ENCOUNTER — Inpatient Hospital Stay: Payer: BC Managed Care – PPO | Attending: Internal Medicine

## 2022-02-06 DIAGNOSIS — C3432 Malignant neoplasm of lower lobe, left bronchus or lung: Secondary | ICD-10-CM | POA: Insufficient documentation

## 2022-02-06 DIAGNOSIS — Z95828 Presence of other vascular implants and grafts: Secondary | ICD-10-CM

## 2022-02-06 DIAGNOSIS — Z5189 Encounter for other specified aftercare: Secondary | ICD-10-CM | POA: Diagnosis not present

## 2022-02-06 MED ORDER — ALTEPLASE 2 MG IJ SOLR
2.0000 mg | Freq: Once | INTRAMUSCULAR | Status: AC
  Administered 2022-02-06: 2 mg
  Filled 2022-02-06: qty 2

## 2022-02-06 MED ORDER — HEPARIN SOD (PORK) LOCK FLUSH 100 UNIT/ML IV SOLN
500.0000 [IU] | Freq: Once | INTRAVENOUS | Status: AC
  Administered 2022-02-06: 500 [IU]

## 2022-02-06 MED ORDER — SODIUM CHLORIDE 0.9% FLUSH
10.0000 mL | Freq: Once | INTRAVENOUS | Status: AC
  Administered 2022-02-06: 10 mL

## 2022-02-06 NOTE — Progress Notes (Signed)
Blood return from port successful. Advised pt if any concerns to call office . Pt verbalized understanding

## 2022-02-07 ENCOUNTER — Telehealth: Payer: Self-pay | Admitting: Primary Care

## 2022-02-07 DIAGNOSIS — E1165 Type 2 diabetes mellitus with hyperglycemia: Secondary | ICD-10-CM

## 2022-02-07 MED ORDER — METFORMIN HCL ER 500 MG PO TB24: 500.0000 mg | ORAL_TABLET | Freq: Every day | ORAL | 0 refills | Status: DC

## 2022-02-07 NOTE — Telephone Encounter (Signed)
Patient called back about message bellow:   Francella Solian, Lee  02/07/2022  9:04 AM EDT Back to Top    Left message to return call to our office.    Pleas Koch, NP  02/03/2022  6:41 PM EDT     Please call patient:   Cholesterol is good.   Blood sugars now show that she has type 2 diabetes. We have 2 options to treat her diabetes:   -Metformin, this is a pill that is taken once daily to treat diabetes. -Weight loss through healthy diet.  Limiting fast/fried/fatty foods, limiting sugary drinks and sugary foods.   Let me know what she decides. Regardless, she will need a 50-month diabetes follow-up in the office.   I let her know what Clearence Cheek message said and patient said she would like to try the Metformin. I also scheduled her for 3 month follow up on 05/10/22

## 2022-02-07 NOTE — Telephone Encounter (Signed)
Noted.  Prescription for metformin sent to pharmacy.

## 2022-02-10 ENCOUNTER — Encounter: Payer: Self-pay | Admitting: Pulmonary Disease

## 2022-02-10 ENCOUNTER — Ambulatory Visit: Payer: BC Managed Care – PPO | Admitting: Pulmonary Disease

## 2022-02-10 VITALS — HR 119 | Ht 63.0 in | Wt 195.0 lb

## 2022-02-10 DIAGNOSIS — Z01818 Encounter for other preprocedural examination: Secondary | ICD-10-CM | POA: Diagnosis not present

## 2022-02-10 DIAGNOSIS — J432 Centrilobular emphysema: Secondary | ICD-10-CM | POA: Diagnosis not present

## 2022-02-10 DIAGNOSIS — J9621 Acute and chronic respiratory failure with hypoxia: Secondary | ICD-10-CM | POA: Diagnosis not present

## 2022-02-10 NOTE — Patient Instructions (Signed)
Nice to see you again  I recommend starting the [place].  2 puffs twice a day every day.  Rinse her mouth out with water after every use.  Use it for at least a month before passing judgment on its usefulness.  If you have any unusual side effects or concerns it is okay to stop it and let me know.  I will make recommendations about things to do around surgery if surgery is an option for the breast cancer moving forward.  I will leave that in the hands of your oncologist as well as the surgeon.  Return to clinic in 3 months or sooner if needed with Dr. Silas Flood

## 2022-02-10 NOTE — Progress Notes (Signed)
@Patient  ID: Deborah [name], female    DOB: 10-21-1955, 66 y.o.   MRN: [id number]  Chief Complaint  Patient presents with   Follow-up    Follow-up: COPD, emphysema.    Referring provider: Pleas Koch, NP  HPI:    02/10/2022  - Visit  Patient in for follow-up.  Last seen 03/2020.  Lost to follow-up is dealing with recurrent breast cancer etc.  Here primarily for preoperative evaluation.  She remains on 4 L nasal cannula same as last visit.  No exacerbations, pneumonia, prednisone use, hospitalizations for respiratory issues since last visit nearly 2 years ago.  We went over risk factor calculator today.  No modifiable risk factors.  See below for further recommendations.  She is not taking Breztri.  She states when she read about potential cardiac side effects she never started it.  We discussed at length the potential benefit of triple inhaled therapy for severe COPD including improvement in quality of life and day-to-day symptoms of dyspnea as well as benefits of population including decreasing exacerbations of COPD as well as data suggesting mortality benefit.  Recent oncology note reviewed x2.  Most recent PFTs personally reviewed and interpreted x2.  Most recent CT scan of chest 11/2021 personally reviewed interpreted as chronic fibrosis/atelectasis of left lower lobe, likely sequela of radiation therapy with chronic findings and 1 cm right lower lobe nodule.  Questionaires / Pulmonary Flowsheets:   ACT:      No data to display          MMRC: mMRC Dyspnea Scale mMRC Score  02/23/2020  5:13 PM 3    Epworth:      No data to display          Tests:   FENO:  No results found for: "NITRICOXIDE"  PFT:    Latest Ref Rng & Units 02/18/2020    4:24 PM 01/01/2017    8:40 AM 06/05/2013    9:00 AM  PFT Results  FVC-Pre L 1.29  1.76    FVC-Predicted Pre % 40  54  54      FVC-Post L 1.33  1.88  1.96      FVC-Predicted Post % 42  58  59      Pre FEV1/FVC % % 74   75  82      Post FEV1/FCV % % 75  80  81      FEV1-Pre L 0.95  1.32  1.47      FEV1-Predicted Pre % 38  52  56      FEV1-Post L 1.00  1.50  1.58      DLCO uncorrected ml/min/mmHg 11.41  14.89  13.94      DLCO UNC% % 58  64  60      DLCO corrected ml/min/mmHg 11.41  14.98    DLCO COR %Predicted % 58  65    DLVA Predicted % 108  92  96      TLC L  4.16  4.13      TLC % Predicted %  84  84      RV % Predicted %  118  111         This result is from an external source.   Personally reviewed and interpreted as severe fixed obstruction, DLCO moderately reduced 02/2020 similar findings 12/2016 on my review and interpretation with addition of lung volumes demonstrating normal TLC, borderline but no evidence of air trapping, subsequent worsening FEV1 2021 to  compare to 2023.  WALK:     04/20/2016    4:38 PM 03/24/2015    5:11 PM 08/26/2014   11:03 AM 04/17/2013    9:21 AM  SIX MIN WALK  Supplimental Oxygen during Test? (L/min)  No Yes No  O2 Flow Rate   2 L/min   Type   Continuous   2 Minute Oxygen Saturation % 93 %     2 Minute HR 125     4 Minute Oxygen Saturation % 93 %     4 Minute HR 132     6 Minute Oxygen Saturation % 92 %     6 Minute HR 154     Tech Comments:  stopped after 2nd lap due to fatigue, sob Pt had c/o sob after completion of 1st lap. Pt was able to complete 3 laps on 2 L 02.     Imaging: No results found.   Lab Results: Personally reviewed CBC    Component Value Date/Time   WBC 12.7 (H) 11/15/2021 1026   WBC 14.1 (H) 06/07/2020 0505   RBC 4.69 11/15/2021 1026   HGB 12.0 11/15/2021 1026   HCT 39.7 11/15/2021 1026   PLT 279 11/15/2021 1026   MCV 84.6 11/15/2021 1026   MCH 25.6 (L) 11/15/2021 1026   MCHC 30.2 11/15/2021 1026   RDW 15.8 (H) 11/15/2021 1026   LYMPHSABS 1.2 11/15/2021 1026   MONOABS 0.8 11/15/2021 1026   EOSABS 0.2 11/15/2021 1026   BASOSABS 0.1 11/15/2021 1026    BMET    Component Value Date/Time   NA 138 11/15/2021 1026   NA 143  11/23/2016 1504   K 3.9 11/15/2021 1026   CL 97 (L) 11/15/2021 1026   CO2 37 (H) 11/15/2021 1026   GLUCOSE 120 (H) 11/15/2021 1026   BUN 12 11/15/2021 1026   BUN 14 11/23/2016 1504   CREATININE 0.59 11/15/2021 1026   CALCIUM 9.0 11/15/2021 1026   GFRNONAA >60 11/15/2021 1026   GFRAA >60 04/02/2020 1400   GFRAA >60 01/26/2020 1019    BNP    Component Value Date/Time   BNP 280.0 (H) 05/26/2020 0505    ProBNP No results found for: "PROBNP"  Specialty Problems       Pulmonary Problems   Chronic respiratory failure ([place])    Followed in Pulmonary clinic/ [facility]/ [facility] - 04/17/2013  Walked RA x 1 laps @ 185 ft each stopped due to  desat 86% rapid pace  From resting sat 94%      Dyspnea    Followed in Pulmonary clinic/ [facility]/ [facility] - hfa 75% p coaching 04/17/2013  - PFT's 06/05/2013 no airflow obst, dlco 60 corrects to 96%      CRLD (chronic restrictive lung disease) secondary to super morbid obesity with mild COPD component   OSA (obstructive sleep apnea)    Auto CPAP 5-10  2 L oxygen blended      COPD exacerbation (HCC)   COPD (chronic obstructive pulmonary disease) ([place])   Primary malignant neoplasm of bronchus of left lower lobe (HCC)   Acute on chronic respiratory failure with hypoxia (HCC)   Acute hypoxemic respiratory failure ([place])    No Known Allergies  Immunization History  Administered Date(s) Administered   Influenza,inj,Quad PF,6+ Mos 04/09/2013, 07/27/2014, 04/18/2016, 05/28/2017, 04/03/2018, 04/08/2019, 04/22/2021   PFIZER(Purple Top)SARS-COV-2 Vaccination 09/28/2019, 10/18/2019   Pfizer Covid-19 Vaccine Bivalent Booster 16yrs & up 04/22/2021   Pneumococcal Conjugate-13 01/01/2017   Pneumococcal Polysaccharide-23 07/27/2014   Tdap  11/12/2017   Zoster Recombinat (Shingrix) 11/12/2019, 01/14/2020    Past Medical History:  Diagnosis Date   Anemia    as teen   Asthma    Breast cancer ([place])    left breast cancer 02/2020   CHF  (congestive heart failure) (HCC)    COPD, mild (HCC)    Depression    Diverticulitis    Family history of anesthesia complication    vomiting   GI bleeding    Heart failure ([place])    New onset 07/25/14   Histoplasmosis    left eye   Hyperkalemia    Hypertension    Lung cancer ([place]) 05/02/2018   s/p chemoradiation, immunotherapy   Obesity (BMI 30-39.9)    Pneumonia    dx wtih pneumonia on 05/27/16- seen by [facility]    PONV (postoperative nausea and vomiting)    Restrictive lung disease    Shortness of breath    with exertion    Sleep apnea    mask and oxygen at nite for sleep at 2L    Tobacco abuse    Umbilical hernia     Tobacco History: Social History   Tobacco Use  Smoking Status Former   Packs/day: 3.00   Years: 43.00   Total pack years: 129.00   Types: Cigarettes   Quit date: 12/17/2012   Years since quitting: 9.1  Smokeless Tobacco Never   Counseling given: Not Answered   Continue to not smoke  Outpatient Encounter Medications as of 02/10/2022  Medication Sig   aspirin 81 MG chewable tablet Chew 1 tablet (81 mg total) by mouth daily.   ipratropium-albuterol (DUONEB) 0.5-2.5 (3) MG/3ML SOLN Take 3 mLs by nebulization every 3 (three) hours as needed.   montelukast (SINGULAIR) 10 MG tablet Take 1 tablet (10 mg total) by mouth at bedtime. For allergies   OXYGEN Inhale 4 L/min into the lungs continuous.    Spacer/Aero-Holding Chambers (AEROCHAMBER MV) inhaler Use as instructed   acetaminophen (TYLENOL) 325 MG tablet Take 650 mg by mouth every 6 (six) hours as needed for headache (pain).   albuterol (VENTOLIN HFA) 108 (90 Base) MCG/ACT inhaler Inhale 2 puffs into the lungs every 6 (six) hours as needed for wheezing or shortness of breath.   Budeson-Glycopyrrol-Formoterol (BREZTRI AEROSPHERE) 160-9-4.8 MCG/ACT AERO Inhale 2 puffs into the lungs daily. (Patient not taking: Reported on 02/10/2022)   Cholecalciferol (VITAMIN D3) 1.25 MG (50000 UT) TABS Take 1 tablet by  mouth daily.   diltiazem (CARDIZEM CD) 240 MG 24 hr capsule TAKE 1 CAPSULE BY MOUTH EVERY DAY   furosemide (LASIX) 20 MG tablet Take 1.5 tablets (30 mg total) by mouth daily.   letrozole (FEMARA) 2.5 MG tablet Take 1 tablet (2.5 mg total) by mouth daily.   lidocaine-prilocaine (EMLA) cream APPLY TO AFFECTED AREA ONCE AS DIRECTED   metFORMIN (GLUCOPHAGE-XR) 500 MG 24 hr tablet Take 1 tablet (500 mg total) by mouth daily with breakfast. for diabetes.   potassium chloride (KLOR-CON M) 10 MEQ tablet Take 1 tablet (10 mEq total) by mouth daily.   rosuvastatin (CRESTOR) 5 MG tablet TAKE 1 TABLET BY MOUTH DAILY. FOR CHOLESTEROL.   [DISCONTINUED] prochlorperazine (COMPAZINE) 10 MG tablet Take 1 tablet (10 mg total) by mouth every 6 (six) hours as needed (Nausea or vomiting).   No facility-administered encounter medications on file as of 02/10/2022.     Review of Systems  Review of Systems  N/A Physical Exam  Pulse (!) 119   Ht 5\' 3"  (  1.6 m)   Wt 195 lb (88.5 kg)   SpO2 94% Comment: On 2L oxygen  BMI 34.54 kg/m   Wt Readings from Last 5 Encounters:  02/10/22 195 lb (88.5 kg)  02/02/22 195 lb (88.5 kg)  11/17/21 196 lb 9 oz (89.2 kg)  11/10/21 200 lb 4.8 oz (90.9 kg)  11/09/21 200 lb (90.7 kg)    BMI Readings from Last 5 Encounters:  02/10/22 34.54 kg/m  02/02/22 34.54 kg/m  11/17/21 34.82 kg/m  11/10/21 35.48 kg/m  11/09/21 35.43 kg/m     Physical Exam General: Chronically ill-appearing, in wheelchair, no acute distress Neck: Habitus precludes JVP evaluation, supple Eyes: EOMI, no icterus Respiratory: Distant breath sounds, diffuse wheezing Abdomen: Obese, bowel sounds present MSK: No synovitis, no joint effusion Neuro: No weakness, no sensation deficit Psych: Normal mood, full affect   Assessment & Plan:   Severe COPD: PFT 2021 with FEV1 just over 30% after bronchodilator.  No significant bronchodilator response.  Not using inhaler medicine, maintenance inhaler.   Encouraged to use Breztri for triple inhaled therapy given severity of symptoms.  She expressed understanding and willing to try now.  2 puffs twice daily.  Chronic hypoxic respiratory failure: Most likely related to severe COPD.  Has some cardiovascular issues as well.  To continue 4 L as prescribed.    Preoperative evaluation: Pulmonary medicine does not provide preoperative clearance rather preoperative risk assessment.  Based on ARSICAT model, patient is intermediate or 13.3% risk of postoperative pulmonary complication.  This was may be slightly overestimated as I perform the calculation with upper abdominal incision as opposed to incision of the breast which could be considered peripheral assuming the duration of surgery less than 2 hours.  Otherwise, the location of incision, peripheral versus upper abdominal does not influence risk.  However, if duration of surgery is greater than 3 hours and assuming an upper abdominal incision, patient is high risk of 42.1% risk of postoperative pulmonary complication.  There are no modifiable risk factors at this time.  Please see below for recommendations: --Avoid general anesthesia if able, use local and spinal if this is an option --If general anesthesia is needed, avoid prolonged neuromuscular blockade --Recommend min DuoNebs preoperatively as well as in the PACU --Given history of OSA, recommend intraoperative blood gas and extubation to BiPAP --Patient is retaining CO2 intraoperatively, recommend postoperative blood gas on BiPAP --If patient is admitted to the hospital after surgery, recommend disposition of progressive or stepdown unit   Return in about 3 months (around 05/13/2022).   Lanier Clam, MD 02/10/2022  I spent 42 minutes in the care of the patient clean face face visit, coordination of care, review of records.

## 2022-02-12 ENCOUNTER — Encounter: Payer: Self-pay | Admitting: Pulmonary Disease

## 2022-02-13 MED ORDER — BREZTRI AEROSPHERE 160-9-4.8 MCG/ACT IN AERO: 2.0000 | INHALATION_SPRAY | Freq: Every day | RESPIRATORY_TRACT | 5 refills | Status: DC

## 2022-02-18 ENCOUNTER — Other Ambulatory Visit: Payer: Self-pay | Admitting: Primary Care

## 2022-02-18 DIAGNOSIS — E876 Hypokalemia: Secondary | ICD-10-CM

## 2022-03-27 ENCOUNTER — Inpatient Hospital Stay: Payer: BC Managed Care – PPO | Attending: Internal Medicine

## 2022-04-04 ENCOUNTER — Inpatient Hospital Stay: Payer: BC Managed Care – PPO

## 2022-04-24 ENCOUNTER — Telehealth: Payer: Self-pay | Admitting: Internal Medicine

## 2022-04-24 NOTE — Telephone Encounter (Signed)
Called patient regarding upcoming appointments, patient is notified. 

## 2022-04-25 ENCOUNTER — Telehealth: Payer: Self-pay

## 2022-04-25 NOTE — Telephone Encounter (Signed)
Left message to call office back inquiring about getting a flu shot.   

## 2022-04-26 ENCOUNTER — Ambulatory Visit: Payer: BC Managed Care – PPO | Admitting: Pulmonary Disease

## 2022-04-26 ENCOUNTER — Encounter: Payer: Self-pay | Admitting: Pulmonary Disease

## 2022-04-26 VITALS — BP 114/64 | HR 102 | Temp 98.1°F | Ht 63.0 in | Wt 176.2 lb

## 2022-04-26 DIAGNOSIS — J9621 Acute and chronic respiratory failure with hypoxia: Secondary | ICD-10-CM

## 2022-04-26 DIAGNOSIS — J432 Centrilobular emphysema: Secondary | ICD-10-CM | POA: Diagnosis not present

## 2022-04-26 NOTE — Patient Instructions (Signed)
Nice to see you again  Continue the Breztri 2 puffs in the morning  If you find yourself needing albuterol more or more short of breath the next up would be to increase it to twice a day.  No changes to medications today  Return to clinic in 3 months or sooner as needed with Dr. Silas Flood

## 2022-04-26 NOTE — Progress Notes (Signed)
@Patient  ID: Deborah [name], female    DOB: Jan 02, 1956, 66 y.o.   MRN: [id number]  Chief Complaint  Patient presents with   Follow-up    Breathing has improved with breztri inhaler    Referring provider: Pleas Koch, NP  HPI:    04/26/2022  - Visit  Returns for routine follow-up.  At last visit was not using Breztri.  She is now using 2 puffs in the morning.  She thinks this is greatly improved her dyspnea.  Able to move around more etc.  Her oxygen is stable.  2 L at rest, 4 L with exertion.  No exacerbations in the interval since last visit.  Questionaires / Pulmonary Flowsheets:   ACT:      No data to display           MMRC: mMRC Dyspnea Scale mMRC Score  02/23/2020  5:13 PM 3    Epworth:      No data to display           Tests:   FENO:  No results found for: "NITRICOXIDE"  PFT:    Latest Ref Rng & Units 02/18/2020    4:24 PM 01/01/2017    8:40 AM 06/05/2013    9:00 AM  PFT Results  FVC-Pre L 1.29  1.76    FVC-Predicted Pre % 40  54  54      FVC-Post L 1.33  1.88  1.96      FVC-Predicted Post % 42  58  59      Pre FEV1/FVC % % 74  75  82      Post FEV1/FCV % % 75  80  81      FEV1-Pre L 0.95  1.32  1.47      FEV1-Predicted Pre % 38  52  56      FEV1-Post L 1.00  1.50  1.58      DLCO uncorrected ml/min/mmHg 11.41  14.89  13.94      DLCO UNC% % 58  64  60      DLCO corrected ml/min/mmHg 11.41  14.98    DLCO COR %Predicted % 58  65    DLVA Predicted % 108  92  96      TLC L  4.16  4.13      TLC % Predicted %  84  84      RV % Predicted %  118  111         This result is from an external source.   Personally reviewed and interpreted as severe fixed obstruction, DLCO moderately reduced 02/2020 similar findings 12/2016 on my review and interpretation with addition of lung volumes demonstrating normal TLC, borderline but no evidence of air trapping, subsequent worsening FEV1 2021 to compare to 2023.  WALK:     04/20/2016    4:38 PM  03/24/2015    5:11 PM 08/26/2014   11:03 AM 04/17/2013    9:21 AM  SIX MIN WALK  Supplimental Oxygen during Test? (L/min)  No Yes No  O2 Flow Rate   2 L/min   Type   Continuous   2 Minute Oxygen Saturation % 93 %     2 Minute HR 125     4 Minute Oxygen Saturation % 93 %     4 Minute HR 132     6 Minute Oxygen Saturation % 92 %     6 Minute HR 154     Tech  Comments:  stopped after 2nd lap due to fatigue, sob Pt had c/o sob after completion of 1st lap. Pt was able to complete 3 laps on 2 L 02.     Imaging: No results found.   Lab Results: Personally reviewed CBC    Component Value Date/Time   WBC 12.7 (H) 11/15/2021 1026   WBC 14.1 (H) 06/07/2020 0505   RBC 4.69 11/15/2021 1026   HGB 12.0 11/15/2021 1026   HCT 39.7 11/15/2021 1026   PLT 279 11/15/2021 1026   MCV 84.6 11/15/2021 1026   MCH 25.6 (L) 11/15/2021 1026   MCHC 30.2 11/15/2021 1026   RDW 15.8 (H) 11/15/2021 1026   LYMPHSABS 1.2 11/15/2021 1026   MONOABS 0.8 11/15/2021 1026   EOSABS 0.2 11/15/2021 1026   BASOSABS 0.1 11/15/2021 1026    BMET    Component Value Date/Time   NA 138 11/15/2021 1026   NA 143 11/23/2016 1504   K 3.9 11/15/2021 1026   CL 97 (L) 11/15/2021 1026   CO2 37 (H) 11/15/2021 1026   GLUCOSE 120 (H) 11/15/2021 1026   BUN 12 11/15/2021 1026   BUN 14 11/23/2016 1504   CREATININE 0.59 11/15/2021 1026   CALCIUM 9.0 11/15/2021 1026   GFRNONAA >60 11/15/2021 1026   GFRAA >60 04/02/2020 1400   GFRAA >60 01/26/2020 1019    BNP    Component Value Date/Time   BNP 280.0 (H) 05/26/2020 0505    ProBNP No results found for: "PROBNP"  Specialty Problems       Pulmonary Problems   Chronic respiratory failure ([place])    Followed in Pulmonary clinic/ [facility]/ [facility] - 04/17/2013  Walked RA x 1 laps @ 185 ft each stopped due to  desat 86% rapid pace  From resting sat 94%      Dyspnea    Followed in Pulmonary clinic/ [facility]/ [place] - hfa 75% p coaching 04/17/2013  - PFT's  06/05/2013 no airflow obst, dlco 60 corrects to 96%      CRLD (chronic restrictive lung disease) secondary to super morbid obesity with mild COPD component   OSA (obstructive sleep apnea)    Auto CPAP 5-10  2 L oxygen blended      COPD exacerbation (HCC)   COPD (chronic obstructive pulmonary disease) ([place])   Primary malignant neoplasm of bronchus of left lower lobe (HCC)   Acute on chronic respiratory failure with hypoxia (HCC)   Acute hypoxemic respiratory failure (HCC)    No Known Allergies  Immunization History  Administered Date(s) Administered   Influenza Inj Mdck Quad Pf 04/22/2022   Influenza,inj,Quad PF,6+ Mos 04/09/2013, 07/27/2014, 04/18/2016, 05/28/2017, 04/03/2018, 04/08/2019, 04/22/2021   PFIZER(Purple Top)SARS-COV-2 Vaccination 09/28/2019, 10/18/2019   Pfizer Covid-19 Vaccine Bivalent Booster 77yrs & up 04/22/2021   Pneumococcal Conjugate-13 01/01/2017   Pneumococcal Polysaccharide-23 07/27/2014   Tdap 11/12/2017   Zoster Recombinat (Shingrix) 11/12/2019, 01/14/2020    Past Medical History:  Diagnosis Date   Anemia    as teen   Asthma    Breast cancer ([place])    left breast cancer 02/2020   CHF (congestive heart failure) (HCC)    COPD, mild (HCC)    Depression    Diverticulitis    Family history of anesthesia complication    vomiting   GI bleeding    Heart failure ([place])    New onset 07/25/14   Histoplasmosis    left eye   Hyperkalemia    Hypertension    Lung cancer ([place]) 05/02/2018  s/p chemoradiation, immunotherapy   Obesity (BMI 30-39.9)    Pneumonia    dx wtih pneumonia on 05/27/16- seen by [facility]    PONV (postoperative nausea and vomiting)    Restrictive lung disease    Shortness of breath    with exertion    Sleep apnea    mask and oxygen at nite for sleep at 2L    Tobacco abuse    Umbilical hernia     Tobacco History: Social History   Tobacco Use  Smoking Status Former   Packs/day: 3.00   Years: 43.00   Total pack years: 129.00    Types: Cigarettes   Quit date: 12/17/2012   Years since quitting: 9.3  Smokeless Tobacco Never   Counseling given: Not Answered   Continue to not smoke  Outpatient Encounter Medications as of 04/26/2022  Medication Sig   acetaminophen (TYLENOL) 325 MG tablet Take 650 mg by mouth every 6 (six) hours as needed for headache (pain).   albuterol (VENTOLIN HFA) 108 (90 Base) MCG/ACT inhaler Inhale 2 puffs into the lungs every 6 (six) hours as needed for wheezing or shortness of breath.   aspirin 81 MG chewable tablet Chew 1 tablet (81 mg total) by mouth daily.   Budeson-Glycopyrrol-Formoterol (BREZTRI AEROSPHERE) 160-9-4.8 MCG/ACT AERO Inhale 2 puffs into the lungs daily.   Cholecalciferol (VITAMIN D3) 1.25 MG (50000 UT) TABS Take 1 tablet by mouth daily.   diltiazem (CARDIZEM CD) 240 MG 24 hr capsule TAKE 1 CAPSULE BY MOUTH EVERY DAY   furosemide (LASIX) 20 MG tablet Take 1.5 tablets (30 mg total) by mouth daily.   ipratropium-albuterol (DUONEB) 0.5-2.5 (3) MG/3ML SOLN Take 3 mLs by nebulization every 3 (three) hours as needed.   letrozole (FEMARA) 2.5 MG tablet Take 1 tablet (2.5 mg total) by mouth daily.   lidocaine-prilocaine (EMLA) cream APPLY TO AFFECTED AREA ONCE AS DIRECTED   metFORMIN (GLUCOPHAGE-XR) 500 MG 24 hr tablet Take 1 tablet (500 mg total) by mouth daily with breakfast. for diabetes.   montelukast (SINGULAIR) 10 MG tablet Take 1 tablet (10 mg total) by mouth at bedtime. For allergies   OXYGEN Inhale 4 L/min into the lungs continuous.    potassium chloride (KLOR-CON M) 10 MEQ tablet Take 1 tablet (10 mEq total) by mouth daily.   rosuvastatin (CRESTOR) 5 MG tablet TAKE 1 TABLET BY MOUTH DAILY. FOR CHOLESTEROL.   Spacer/Aero-Holding Chambers (AEROCHAMBER MV) inhaler Use as instructed   [DISCONTINUED] prochlorperazine (COMPAZINE) 10 MG tablet Take 1 tablet (10 mg total) by mouth every 6 (six) hours as needed (Nausea or vomiting).   No facility-administered encounter medications  on file as of 04/26/2022.     Review of Systems  Review of Systems  N/A Physical Exam  BP 114/64 (BP Location: Left Arm, Patient Position: Sitting, Cuff Size: Normal)   Pulse (!) 102   Temp 98.1 F (36.7 C) (Oral)   Ht 5\' 3"  (1.6 m)   Wt 176 lb 3.7 oz (79.9 kg)   SpO2 95%   BMI 31.22 kg/m   Wt Readings from Last 5 Encounters:  04/26/22 176 lb 3.7 oz (79.9 kg)  02/10/22 195 lb (88.5 kg)  02/02/22 195 lb (88.5 kg)  11/17/21 196 lb 9 oz (89.2 kg)  11/10/21 200 lb 4.8 oz (90.9 kg)    BMI Readings from Last 5 Encounters:  04/26/22 31.22 kg/m  02/10/22 34.54 kg/m  02/02/22 34.54 kg/m  11/17/21 34.82 kg/m  11/10/21 35.48 kg/m     Physical  Exam General: Chronically ill-appearing, in wheelchair, no acute distress Neck: Habitus precludes JVP evaluation, supple Eyes: EOMI, no icterus Respiratory: Distant breath sounds, mild wheezing left greater than right Abdomen: Obese, bowel sounds present MSK: No synovitis, no joint effusion Neuro: No weakness, no sensation deficit Psych: Normal mood, full affect   Assessment & Plan:   Severe COPD: PFT 2021 with FEV1 just over 30% after bronchodilator.  No significant bronchodilator response.  Not using inhaler medicine, maintenance inhaler.  Symptoms improved with Breztri 2 puffs daily-advised to continue, if dyspnea were to worsen increase to twice daily.  Chronic hypoxic respiratory failure: Most likely related to severe COPD.  Has some cardiovascular issues as well.  To continue 4 L as prescribed.    Return in about 3 months (around 07/27/2022).   Lanier Clam, MD 04/26/2022

## 2022-04-30 ENCOUNTER — Other Ambulatory Visit: Payer: Self-pay | Admitting: Primary Care

## 2022-04-30 DIAGNOSIS — E1165 Type 2 diabetes mellitus with hyperglycemia: Secondary | ICD-10-CM

## 2022-05-01 ENCOUNTER — Telehealth: Payer: Self-pay | Admitting: Hematology and Oncology

## 2022-05-01 NOTE — Telephone Encounter (Signed)
Rescheduled appointment per provider PAL. Left voicemail.

## 2022-05-03 ENCOUNTER — Other Ambulatory Visit: Payer: Self-pay | Admitting: Primary Care

## 2022-05-03 DIAGNOSIS — E876 Hypokalemia: Secondary | ICD-10-CM

## 2022-05-05 ENCOUNTER — Other Ambulatory Visit: Payer: Self-pay | Admitting: Cardiovascular Disease

## 2022-05-08 ENCOUNTER — Encounter: Payer: Self-pay | Admitting: Internal Medicine

## 2022-05-10 ENCOUNTER — Ambulatory Visit: Payer: BC Managed Care – PPO | Admitting: Primary Care

## 2022-05-11 ENCOUNTER — Ambulatory Visit: Payer: BC Managed Care – PPO | Attending: Medical | Admitting: Medical

## 2022-05-11 NOTE — Progress Notes (Deleted)
Cardiology Office Note:    Date:  05/11/2022   ID:  Deborah [name], DOB 1955/10/25, MRN [id number]  PCP:  Pleas Koch, NP  [facility] Cardiologist:  Kathlyn Sacramento, MD  [facility] Electrophysiologist:  None   Referring MD: Pleas Koch, NP   Chief Complaint: 6 month follow-up  History of Present Illness:    Deborah [name] is a 66 y.o. female with a hx of chronic diastolic heart failure, chronic right heel heart failure due to COPD, previous tobacco use with COPD, hypertension, obesity, bipolar disorder, and here today for follow-up.   She was admitted 07/2014 for CHF.  She had a moderate left pleural effusion.  Echo showed EF 50 to 55%, G1 DD, dilated with RV with hypokinesis and mild pulmonary hypertension.  VQ scan showed low probability for pulmonary embolism.  She subsequently quit smoking.     She was diagnosed with squamous cell carcinoma of the lung, stage IIIb 07/2017.  She had a large left lower lobe mass in addition to mediastinal lymphadenopathy.  She was treated with radiation, chemotherapy, and immunotherapy.   Subsequent echo showed EF 55 to 60%, NR WMA, indeterminate diastolic filling due to EA fusion, RVSP 35.0 mmHg.   Last seen 04/2021 and HR were still high, Zio was recommended.    Heart monitor showed predominately normal rhthym, average HR 90s, with 1st degree AV block, and Second degree AV block, brief SVT, rare PVCs.  Last seen 10/2021 and was doing well from a cardiac standpoint.   Today,   Past Medical History:  Diagnosis Date   Anemia    as teen   Asthma    Breast cancer ([place])    left breast cancer 02/2020   CHF (congestive heart failure) (HCC)    COPD, mild (HCC)    Depression    Diverticulitis    Family history of anesthesia complication    vomiting   GI bleeding    Heart failure ([place])    New onset 07/25/14   Histoplasmosis    left eye   Hyperkalemia    Hypertension    Lung cancer ([place]) 05/02/2018   s/p  chemoradiation, immunotherapy   Obesity (BMI 30-39.9)    Pneumonia    dx wtih pneumonia on 05/27/16- seen by [facility]    PONV (postoperative nausea and vomiting)    Restrictive lung disease    Shortness of breath    with exertion    Sleep apnea    mask and oxygen at nite for sleep at 2L    Tobacco abuse    Umbilical hernia     Past Surgical History:  Procedure Laterality Date   APPLICATION OF WOUND VAC  03/19/2013   Procedure: APPLICATION OF WOUND VAC;  Surgeon: Madilyn Hook, DO;  Location: [facility];  Service: General;;   BREAST BIOPSY Left 09/29/2021   BREAST BIOPSY Left 10/18/2021   BREAST LUMPECTOMY Left 04/08/2020   BREAST LUMPECTOMY WITH RADIOACTIVE SEED AND SENTINEL LYMPH NODE BIOPSY Left 04/08/2020   Procedure: LEFT BREAST LUMPECTOMY WITH RADIOACTIVE SEED AND LEFT AXILLARY SENTINEL LYMPH NODE BIOPSY;  Surgeon: Alphonsa Overall, MD;  Location: [place];  Service: General;  Laterality: Left;  PEC BLOCK, NEEDS ANESTHESIA CONSULT   CESAREAN SECTION  04/12/1985   COLONOSCOPY WITH PROPOFOL N/A 06/13/2016   Procedure: COLONOSCOPY WITH PROPOFOL;  Surgeon: Jerene Bears, MD;  Location: [facility];  Service: Gastroenterology;  Laterality: N/A;   ERCP N/A 02/15/2017   Procedure: ENDOSCOPIC RETROGRADE CHOLANGIOPANCREATOGRAPHY (  ERCP);  Surgeon: [name], MD;  Location: [facility];  Service: Endoscopy;  Laterality: N/A;   INSERTION OF MESH N/A 03/19/2013   Procedure: INSERTION OF MESH;  Surgeon: Madilyn Hook, DO;  Location: [facility];  Service: General;  Laterality: N/A;   IR IMAGING GUIDED PORT INSERTION  03/26/2020   IR THORACENTESIS ASP PLEURAL SPACE W/IMG GUIDE  05/25/2020   VENTRAL HERNIA REPAIR N/A 03/19/2013   Procedure:  OPEN VENTRAL HERNIA REPAIR WITH MESH AND APPLICATION OF WOUND VAC;  Surgeon: Madilyn Hook, DO;  Location: [facility];  Service: General;  Laterality: N/A;   VIDEO BRONCHOSCOPY Bilateral 04/19/2018   Procedure: VIDEO BRONCHOSCOPY WITH FLUORO;  Surgeon: Rigoberto Noel,  MD;  Location: [facility];  Service: Cardiopulmonary;  Laterality: Bilateral;    Current Medications: No outpatient medications have been marked as taking for the 05/11/22 encounter (Appointment) with Kathlen Mody, Kem Parcher H, PA-C.     Allergies:   Patient has no known allergies.   Social History   Socioeconomic History   Marital status: Married    Spouse name: Not on file   Number of children: 1   Years of education: Not on file   Highest education level: Not on file  Occupational History   Occupation: Retired    Fish farm manager: NOT EMPLOYED  Tobacco Use   Smoking status: Former    Packs/day: 3.00    Years: 43.00    Total pack years: 129.00    Types: Cigarettes    Quit date: 12/17/2012    Years since quitting: 9.4   Smokeless tobacco: Never  Vaping Use   Vaping Use: Former  Substance and Sexual Activity   Alcohol use: No    Alcohol/week: 0.0 standard drinks of alcohol   Drug use: No   Sexual activity: Not on file  Other Topics Concern   Not on file  Social History Narrative   ** Merged History Encounter **       ** Data from: 07/25/14 Enc Dept: [facility]       ** Data from: 06/05/13 Enc Dept: [place]   Married, one son 22 yo.            Social Determinants of Health   Financial Resource Strain: Not on file  Food Insecurity: Not on file  Transportation Needs: Unmet Transportation Needs (05/02/2018)   PRAPARE - Hydrologist (Medical): Yes    Lack of Transportation (Non-Medical): No  Physical Activity: Not on file  Stress: Not on file  Social Connections: Not on file     Family History: The patient's family history includes Bipolar disorder in her father and sister; Congestive Heart Failure in her father; Emphysema in her mother; Heart attack in her maternal uncle, maternal uncle, and mother; Lymphoma in her maternal uncle; Melanoma in her sister; Other in her sister; Pancreatic cancer in her maternal aunt; Schizophrenia in  her sister. There is no history of Colon cancer or Breast cancer.  ROS:   Please see the history of present illness.     All other systems reviewed and are negative.  EKGs/Labs/Other Studies Reviewed:    The following studies were reviewed today: Echo 01/2020  1. Left ventricular ejection fraction, by estimation, is >55%. The left  ventricle has normal function. Left ventricular endocardial border not  optimally defined to evaluate regional wall motion. There is moderate left  ventricular hypertrophy.  Indeterminate diastolic filling due to E-A fusion.   2. Right ventricular systolic function  is normal. The right ventricular  size is not well visualized. Mildly increased right ventricular wall  thickness. Tricuspid regurgitation signal is inadequate for assessing PA  pressure.   3. The mitral valve was not well visualized. No evidence of mitral valve  regurgitation.   4. The aortic valve was not well visualized. Aortic valve regurgitation  is not visualized. No aortic stenosis is present.    EKG:  EKG is *** ordered today.  The ekg ordered today demonstrates ***  Recent Labs: 11/15/2021: ALT 9; BUN 12; Creatinine 0.59; Hemoglobin 12.0; Platelet Count 279; Potassium 3.9; Sodium 138  Recent Lipid Panel    Component Value Date/Time   CHOL 139 02/02/2022 1541   TRIG 187.0 (H) 02/02/2022 1541   HDL 42.60 02/02/2022 1541   CHOLHDL 3 02/02/2022 1541   VLDL 37.4 02/02/2022 1541   LDLCALC 59 02/02/2022 1541   LDLDIRECT 148.0 12/08/2020 1444     Risk Assessment/Calculations:   {Does this patient have ATRIAL FIBRILLATION?:[phone]}   Physical Exam:    VS:  There were no vitals taken for this visit.    Wt Readings from Last 3 Encounters:  04/26/22 176 lb 3.7 oz (79.9 kg)  02/10/22 195 lb (88.5 kg)  02/02/22 195 lb (88.5 kg)     GEN: *** Well nourished, well developed in no acute distress HEENT: Normal NECK: No JVD; No carotid bruits LYMPHATICS: No  lymphadenopathy CARDIAC: ***RRR, no murmurs, rubs, gallops RESPIRATORY:  Clear to auscultation without rales, wheezing or rhonchi  ABDOMEN: Soft, non-tender, non-distended MUSCULOSKELETAL:  No edema; No deformity  SKIN: Warm and dry NEUROLOGIC:  Alert and oriented x 3 PSYCHIATRIC:  Normal affect   ASSESSMENT:    No diagnosis found. PLAN:    In order of problems listed above:  Chronic diastolic heart failure Chronic right heart failure due to COPD  HTN    Disposition: Follow up {follow up:15908} with ***   Shared Decision Making/Informed Consent   {Are you ordering a CV Procedure (e.g. stress test, cath, DCCV, TEE, etc)?   Press F2        :[id number]}    Signed, Vonceil Upshur Ninfa Meeker, PA-C  05/11/2022 12:50 PM     [facility]

## 2022-05-12 ENCOUNTER — Other Ambulatory Visit: Payer: Self-pay | Admitting: *Deleted

## 2022-05-12 ENCOUNTER — Encounter: Payer: Self-pay | Admitting: Medical

## 2022-05-12 DIAGNOSIS — C3490 Malignant neoplasm of unspecified part of unspecified bronchus or lung: Secondary | ICD-10-CM

## 2022-05-12 NOTE — Progress Notes (Signed)
Patient Care Team: Pleas Koch, NP as PCP - General (Internal Medicine) [name], MD as PCP - Cardiology (Cardiology) [name], MD (Inactive) (Family Medicine) [name], MD as Consulting Physician (Cardiology) Rockwell Germany, RN as Oncology Nurse Navigator Mauro Kaufmann, RN as Oncology Nurse Navigator  DIAGNOSIS: No diagnosis found.  SUMMARY OF ONCOLOGIC HISTORY: Oncology History  Primary malignant neoplasm of bronchus of left lower lobe ([place])  05/02/2018 Initial Diagnosis   Stage III squamous cell carcinoma of left lung ([place])   05/13/2018 - 06/24/2018 Chemotherapy   The patient had palonosetron (ALOXI) injection 0.25 mg, 0.25 mg, Intravenous,  Once, 7 of 7 cycles Administration: 0.25 mg (05/13/2018), 0.25 mg (06/10/2018), 0.25 mg (06/17/2018), 0.25 mg (05/20/2018), 0.25 mg (06/24/2018), 0.25 mg (05/27/2018), 0.25 mg (06/03/2018) CARBOplatin (PARAPLATIN) 300 mg in sodium chloride 0.9 % 250 mL chemo infusion, 300 mg (100 % of original dose 300 mg), Intravenous,  Once, 7 of 7 cycles Dose modification: 300 mg (original dose 300 mg, Cycle 1) Administration: 300 mg (05/13/2018), 300 mg (06/10/2018), 260 mg (06/17/2018), 300 mg (05/20/2018), 300 mg (06/24/2018), 300 mg (05/27/2018), 300 mg (06/03/2018) PACLitaxel (TAXOL) 96 mg in sodium chloride 0.9 % 250 mL chemo infusion (</= 32m/m2), 45 mg/m2 = 96 mg, Intravenous,  Once, 7 of 7 cycles Administration: 96 mg (05/13/2018), 96 mg (06/10/2018), 96 mg (06/17/2018), 96 mg (05/20/2018), 96 mg (06/24/2018), 96 mg (05/27/2018), 96 mg (06/03/2018)  for chemotherapy treatment.    07/31/2018 - 07/15/2019 Chemotherapy   The patient had durvalumab (IMFINZI) 1,000 mg in sodium chloride 0.9 % 100 mL chemo infusion, 1,020 mg, Intravenous,  Once, 26 of 26 cycles Administration: 1,000 mg (07/31/2018), 1,000 mg (10/22/2018), 1,000 mg (11/05/2018), 1,000 mg (11/19/2018), 1,000 mg (12/03/2018), 1,000 mg (08/14/2018), 1,000 mg (12/17/2018), 1,000  mg (08/27/2018), 1,000 mg (09/10/2018), 1,000 mg (09/24/2018), 1,000 mg (10/11/2018), 1,000 mg (12/31/2018), 1,000 mg (01/14/2019), 1,120 mg (01/28/2019), 1,120 mg (02/11/2019), 1,120 mg (02/25/2019), 1,120 mg (03/11/2019), 1,120 mg (03/25/2019), 1,120 mg (04/08/2019), 1,120 mg (04/22/2019), 1,120 mg (05/06/2019), 1,120 mg (05/20/2019), 1,120 mg (06/03/2019), 1,120 mg (06/17/2019), 1,120 mg (07/01/2019), 1,120 mg (07/15/2019)  for chemotherapy treatment.    05/11/2020 - 05/13/2020 Chemotherapy   Received 1 cycle of Adriamycin and Cytoxan and discontinued it   11/17/2021 Cancer Staging   Staging form: Lung, AJCC 8th Edition - Clinical: Stage IIIB (cT3, cN2, cM0) - Signed by MCurt Bears MD on 11/17/2021   Malignant neoplasm of upper-outer quadrant of left breast in female, estrogen receptor negative ([facility]  02/19/2020 Cancer Staging   Staging form: Breast, AJCC 8th Edition - Clinical stage from 02/19/2020: Stage IIB (cT2, cN0, cM0, G3, ER-, PR-, HER2-) - Signed by CGardenia Phlegm NP on 02/25/2020   02/19/2020 Initial Diagnosis   Chest CT for lung cancer follow-up showed a left breast module. Mammogram and [place]ore[place]owed a 3.7cm mass with calcifications at the 2 o'clock position in the left breast, no left axillary adenopathy. Biopsy showed IDC, grade 3, HER-2 negative (0), ER/PR negative, Ki67 40%.   04/08/2020 Surgery   Left lumpectomy ([name]: three foci of IDC, grade 3, 3.3cm, 1.0cm, 0.7cm, with high grade DCIS, clear margins, 2 left axillary lymph nodes negative for carcinoma.    05/11/2020 - 05/13/2020 Chemotherapy   Received 1 cycle of Adriamycin and Cytoxan and discontinued it   10/18/2021 Relapse/Recurrence   Mammogram detected indeterminate left breast mass at 12 o'clock position measuring 6 mm, 2 focal areas of calcifications (H 8 mm) within  the left breast favored to be fat necrosis. The 6 mm mass on biopsy came back as grade 3 IDC ER 30% weak, PR 0%, HER2 negative, Ki-67 20%     CHIEF COMPLIANT:  Follow-up breast cancer  INTERVAL HISTORY: Deborah [name] is a  66 y.o. with the above- mentioned breast cancer. She presents to the clinic today for a follow-up.    ALLERGIES:  has No Known Allergies.  MEDICATIONS:  Current Outpatient Medications  Medication Sig Dispense Refill   acetaminophen (TYLENOL) 325 MG tablet Take 650 mg by mouth every 6 (six) hours as needed for headache (pain).     albuterol (VENTOLIN HFA) 108 (90 Base) MCG/ACT inhaler Inhale 2 puffs into the lungs every 6 (six) hours as needed for wheezing or shortness of breath. 8 g 3   aspirin 81 MG chewable tablet Chew 1 tablet (81 mg total) by mouth daily. 30 tablet 1   Budeson-Glycopyrrol-Formoterol (BREZTRI AEROSPHERE) 160-9-4.8 MCG/ACT AERO Inhale 2 puffs into the lungs daily. 5.9 g 5   Cholecalciferol (VITAMIN D3) 1.25 MG (50000 UT) TABS Take 1 tablet by mouth daily.     diltiazem (CARDIZEM CD) 240 MG 24 hr capsule TAKE 1 CAPSULE BY MOUTH EVERY DAY 90 capsule 0   furosemide (LASIX) 20 MG tablet Take 1.5 tablets (30 mg total) by mouth daily. 135 tablet 3   ipratropium-albuterol (DUONEB) 0.5-2.5 (3) MG/3ML SOLN Take 3 mLs by nebulization every 3 (three) hours as needed. 360 mL 11   letrozole (FEMARA) 2.5 MG tablet Take 1 tablet (2.5 mg total) by mouth daily. 90 tablet 3   lidocaine-prilocaine (EMLA) cream APPLY TO AFFECTED AREA ONCE AS DIRECTED 30 g 3   metFORMIN (GLUCOPHAGE-XR) 500 MG 24 hr tablet TAKE 1 TABLET BY MOUTH DAILY WITH BREAKFAST. FOR DIABETES. 90 tablet 0   montelukast (SINGULAIR) 10 MG tablet Take 1 tablet (10 mg total) by mouth at bedtime. For allergies 90 tablet 3   OXYGEN Inhale 4 L/min into the lungs continuous.      potassium chloride (KLOR-CON M) 10 MEQ tablet Take 1 tablet (10 mEq total) by mouth daily. 90 tablet 3   rosuvastatin (CRESTOR) 5 MG tablet TAKE 1 TABLET BY MOUTH DAILY. FOR CHOLESTEROL. 90 tablet 3   Spacer/Aero-Holding Chambers (AEROCHAMBER MV) inhaler Use as instructed 1 each 0   No  current facility-administered medications for this visit.    PHYSICAL EXAMINATION: ECOG PERFORMANCE STATUS: {CHL ONC ECOG PS:[phone]}  There were no vitals filed for this visit. There were no vitals filed for this visit.  BREAST:*** No palpable masses or nodules in either right or left breasts. No palpable axillary supraclavicular or infraclavicular adenopathy no breast tenderness or nipple discharge. (exam performed in the presence of a chaperone)  LABORATORY DATA:  I have reviewed the data as listed    Latest Ref Rng & Units 11/15/2021   10:26 AM 11/09/2021    3:39 PM 04/25/2021   11:53 AM  CMP  Glucose 70 - 99 mg/dL 120  106  114   BUN 8 - 23 mg/dL _0 Creatinine 0.44 - 1.00 mg/dL 0.59  0.70  0.77   Sodium 135 - 145 mmol/L 138  140  142   Potassium 3.5 - 5.1 mmol/L 3.9  3.8  4.2   Chloride 98 - 111 mmol/L 97  100  100   CO2 22 - 32 mmol/L 37  34  31   Calcium 8.9 - 10.3 mg/dL 9.0  8.7  9.4   Total Protein 6.5 - 8.1 g/dL 7.5   7.9   Total Bilirubin 0.3 - 1.2 mg/dL 0.3   0.3   Alkaline Phos 38 - 126 U/L 89   99   AST 15 - 41 U/L 11   14   ALT 0 - 44 U/L 9   10     Lab Results  Component Value Date   WBC 12.7 (H) 11/15/2021   HGB 12.0 11/15/2021   HCT 39.7 11/15/2021   MCV 84.6 11/15/2021   PLT 279 11/15/2021   NEUTROABS 10.4 (H) 11/15/2021    ASSESSMENT & PLAN:  No problem-specific Assessment & Plan notes found for this encounter.    No orders of the defined types were placed in this encounter.  The patient has a good understanding of the overall plan. she agrees with it. she will call with any problems that may develop before the next visit here. Total time spent: 30 mins including face to face time and time spent for planning, charting and co-ordination of care   Suzzette Righter, [place] 05/12/22    I Gardiner Coins am scribing for Dr. [name]  ***

## 2022-05-15 ENCOUNTER — Ambulatory Visit: Payer: BC Managed Care – PPO | Admitting: Hematology and Oncology

## 2022-05-15 ENCOUNTER — Inpatient Hospital Stay: Payer: BC Managed Care – PPO

## 2022-05-16 ENCOUNTER — Other Ambulatory Visit: Payer: Self-pay

## 2022-05-16 ENCOUNTER — Inpatient Hospital Stay: Payer: BC Managed Care – PPO

## 2022-05-16 ENCOUNTER — Inpatient Hospital Stay: Payer: BC Managed Care – PPO | Attending: Internal Medicine | Admitting: Hematology and Oncology

## 2022-05-16 VITALS — BP 99/73 | HR 77 | Temp 97.7°F | Resp 18 | Ht 63.0 in | Wt 177.7 lb

## 2022-05-16 DIAGNOSIS — C50412 Malignant neoplasm of upper-outer quadrant of left female breast: Secondary | ICD-10-CM | POA: Insufficient documentation

## 2022-05-16 DIAGNOSIS — C3432 Malignant neoplasm of lower lobe, left bronchus or lung: Secondary | ICD-10-CM | POA: Diagnosis not present

## 2022-05-16 DIAGNOSIS — Z79899 Other long term (current) drug therapy: Secondary | ICD-10-CM | POA: Insufficient documentation

## 2022-05-16 DIAGNOSIS — Z171 Estrogen receptor negative status [ER-]: Secondary | ICD-10-CM | POA: Insufficient documentation

## 2022-05-16 DIAGNOSIS — C3490 Malignant neoplasm of unspecified part of unspecified bronchus or lung: Secondary | ICD-10-CM

## 2022-05-16 DIAGNOSIS — Z79811 Long term (current) use of aromatase inhibitors: Secondary | ICD-10-CM | POA: Insufficient documentation

## 2022-05-16 DIAGNOSIS — Z95828 Presence of other vascular implants and grafts: Secondary | ICD-10-CM

## 2022-05-16 LAB — CMP (CANCER CENTER ONLY)
ALT: 10 U/L (ref 0–44)
AST: 14 U/L — ABNORMAL LOW (ref 15–41)
Albumin: 3.7 g/dL (ref 3.5–5.0)
Alkaline Phosphatase: 67 U/L (ref 38–126)
Anion gap: 8 (ref 5–15)
BUN: 13 mg/dL (ref 8–23)
CO2: 32 mmol/L (ref 22–32)
Calcium: 9.1 mg/dL (ref 8.9–10.3)
Chloride: 99 mmol/L (ref 98–111)
Creatinine: 1.04 mg/dL — ABNORMAL HIGH (ref 0.44–1.00)
GFR, Estimated: >60 mL/min (ref >60)
Glucose, Bld: 125 mg/dL — ABNORMAL HIGH (ref 70–99)
Potassium: 4.1 mmol/L (ref 3.5–5.1)
Sodium: 139 mmol/L (ref 135–145)
Total Bilirubin: 0.3 mg/dL (ref 0.3–1.2)
Total Protein: 7.8 g/dL (ref 6.5–8.1)

## 2022-05-16 LAB — CBC WITH DIFFERENTIAL (CANCER CENTER ONLY)
Abs Immature Granulocytes: 0.05 10*3/uL (ref 0.00–0.07)
Basophils Absolute: 0.1 10*3/uL (ref 0.0–0.1)
Basophils Relative: 0 %
Eosinophils Absolute: 0.3 10*3/uL (ref 0.0–0.5)
Eosinophils Relative: 2 %
HCT: 37.8 % (ref 36.0–46.0)
Hemoglobin: 11.8 g/dL — ABNORMAL LOW (ref 12.0–15.0)
Immature Granulocytes: 0 %
Lymphocytes Relative: 11 %
Lymphs Abs: 1.6 10*3/uL (ref 0.7–4.0)
MCH: 25.7 pg — ABNORMAL LOW (ref 26.0–34.0)
MCHC: 31.2 g/dL (ref 30.0–36.0)
MCV: 82.4 fL (ref 80.0–100.0)
Monocytes Absolute: 0.9 10*3/uL (ref 0.1–1.0)
Monocytes Relative: 6 %
Neutro Abs: 11.9 10*3/uL — ABNORMAL HIGH (ref 1.7–7.7)
Neutrophils Relative %: 81 %
Platelet Count: 335 10*3/uL (ref 150–400)
RBC: 4.59 MIL/uL (ref 3.87–5.11)
RDW: 16.5 % — ABNORMAL HIGH (ref 11.5–15.5)
WBC Count: 14.8 10*3/uL — ABNORMAL HIGH (ref 4.0–10.5)
nRBC: 0 % (ref 0.0–0.2)

## 2022-05-16 MED ORDER — HEPARIN SOD (PORK) LOCK FLUSH 100 UNIT/ML IV SOLN
500.0000 [IU] | Freq: Once | INTRAVENOUS | Status: AC
  Administered 2022-05-16: 500 [IU]

## 2022-05-16 MED ORDER — SODIUM CHLORIDE 0.9% FLUSH
10.0000 mL | Freq: Once | INTRAVENOUS | Status: AC
  Administered 2022-05-16: 10 mL

## 2022-05-16 NOTE — Assessment & Plan Note (Signed)
04/08/20:Left lumpectomy [name]): three foci of IDC, grade 3, 3.3cm, 1.0cm, 0.7cm, with high grade DCIS, clear margins, 2 left axillary lymph nodes negative for carcinoma. HER-2 negative (0), ER/PR negative, Ki67 40%.  Treatment Plan: 1.Adjuvant chemotherapy with Adriamycin and Cytoxan dose dense 4 followed by Taxol weekly 12(discontinued after cycle 1 because of profound fatigue and generalized failure to thrive along with hypoxia and tachycardia) the first cycle was even given at a low dose 2. Followed by adjuvant radiation therapy(patient does not want to receive radiation)  Severe COPD: 24-hour oxygen -------------------------------------------------------------------------------------------------------------------------------------------- Hospitalization: GI bleed: 4 units of PRBC given November 2021: Today's hemoglobin is 12.2.  Relapse: Mammogram detected indeterminate left breast mass at 12 o'clock position measuring 6 mm, 2 focal areas of calcifications (H 8 mm) within the left breast favored to be fat necrosis. The 6 mm mass on biopsy came back as grade 3 IDC ER 30% weak, PR 0%, HER2 negative, Ki-67 20%  Treatment plan: 1.  Surgery decided that she is too high risk for surgery and therefore medical management was advised.  Current treatment: Letrozole Letrozole toxicities: None  Treatment plan: Perform mammogram and ultrasound in 3 months. If she is responding to treatment we will continue with the same plan.  If she is not responding to treatment we will consider surgical option at that time.  Return to clinic in 3 months after scans for follow-up.

## 2022-05-19 ENCOUNTER — Other Ambulatory Visit: Payer: BC Managed Care – PPO

## 2022-05-19 ENCOUNTER — Encounter (HOSPITAL_COMMUNITY): Payer: Self-pay

## 2022-05-19 ENCOUNTER — Ambulatory Visit (HOSPITAL_COMMUNITY)
Admission: RE | Admit: 2022-05-19 | Discharge: 2022-05-19 | Disposition: A | Discharge status: Home / Self Care | Payer: BC Managed Care – PPO | Source: Ambulatory Visit | Attending: Internal Medicine | Admitting: Internal Medicine

## 2022-05-19 DIAGNOSIS — J479 Bronchiectasis, uncomplicated: Secondary | ICD-10-CM | POA: Diagnosis not present

## 2022-05-19 DIAGNOSIS — J432 Centrilobular emphysema: Secondary | ICD-10-CM | POA: Diagnosis not present

## 2022-05-19 DIAGNOSIS — J90 Pleural effusion, not elsewhere classified: Secondary | ICD-10-CM | POA: Diagnosis not present

## 2022-05-19 DIAGNOSIS — C3490 Malignant neoplasm of unspecified part of unspecified bronchus or lung: Secondary | ICD-10-CM | POA: Diagnosis not present

## 2022-05-19 MED ORDER — SODIUM CHLORIDE (PF) 0.9 % IJ SOLN
INTRAMUSCULAR | Status: AC
  Filled 2022-05-19: qty 50

## 2022-05-19 MED ORDER — IOHEXOL 300 MG/ML  SOLN
75.0000 mL | Freq: Once | INTRAMUSCULAR | Status: AC | PRN
  Administered 2022-05-19: 75 mL via INTRAVENOUS

## 2022-05-22 ENCOUNTER — Inpatient Hospital Stay: Payer: BC Managed Care – PPO | Attending: Internal Medicine | Admitting: Internal Medicine

## 2022-05-22 VITALS — BP 106/66 | HR 108 | Temp 97.7°F | Wt 175.4 lb

## 2022-05-22 DIAGNOSIS — C3490 Malignant neoplasm of unspecified part of unspecified bronchus or lung: Secondary | ICD-10-CM

## 2022-05-22 DIAGNOSIS — C50412 Malignant neoplasm of upper-outer quadrant of left female breast: Secondary | ICD-10-CM | POA: Diagnosis not present

## 2022-05-22 DIAGNOSIS — Z79811 Long term (current) use of aromatase inhibitors: Secondary | ICD-10-CM | POA: Insufficient documentation

## 2022-05-22 DIAGNOSIS — C3432 Malignant neoplasm of lower lobe, left bronchus or lung: Secondary | ICD-10-CM | POA: Insufficient documentation

## 2022-05-22 NOTE — Progress Notes (Signed)
[facility] [phone]   [phone]  OFFICE PROGRESS NOTE  Pleas Koch, NP [place] [address]  DIAGNOSIS:  1) stage IIIB (T3, N2, M0) non-small cell lung cancer, squamous cell carcinoma presented with large left lower lobe lung mass in addition to mediastinal lymphadenopathy diagnosed in October 2019. PDL 1 expression is negative 2) recurrent breast cancer initially diagnosed as stage IIb (T2, N0, M0) left breast invasive ductal carcinoma involving the upper outer quadrant of the left breast diagnosed in August 2021.  PRIOR THERAPY:  1) Concurrent chemoradiation with weekly carboplatin for AUC of 2 and paclitaxel 45 mg/M2.  Status post 7 cycles with partial response. 2) Consolidation treatment with immunotherapy with Imfinzi 10 mg/KG every 2 weeks.  First dose July 31, 2018.  Status post 26 cycles. 3) status post left lumpectomy followed by adjuvant chemotherapy with Adriamycin, Cytoxan and Taxol  CURRENT THERAPY: Femara 2.5 mg p.o. daily under the care of Dr. [name] started on November 11, 2021  INTERVAL HISTORY: Deborah [name] 66 y.o. female returns to the clinic today for follow-up visit accompanied by her husband.  The patient is feeling fine today with no concerning complaints except for the baseline shortness of breath increased with exertion.  She underwent cardiopulmonary rehabilitation and felt much better.  She is followed by Dr. [name] for her breast cancer.  He felt the lump and the left breast area and she is supposed to get further imaging studies by the breast center.  She denied having any current chest pain, cough or hemoptysis.  She has no nausea, vomiting, diarrhea or constipation.  She has no headache or visual changes.  She is here today for evaluation with repeat CT scan of the chest for restaging of her disease.  MEDICAL HISTORY: Past Medical History:  Diagnosis Date   Anemia    as teen   Asthma     Breast cancer ([facility])    left breast cancer 02/2020   CHF (congestive heart failure) ([facility])    COPD, mild ([facility])    Depression    Diverticulitis    Family history of anesthesia complication    vomiting   GI bleeding    Heart failure ([facility])    New onset 07/25/14   Histoplasmosis    left eye   Hyperkalemia    Hypertension    Lung cancer ([facility]) 05/02/2018   s/p chemoradiation, immunotherapy   Obesity (BMI 30-39.9)    Pneumonia    dx wtih pneumonia on 05/27/16- seen by [facility]    PONV (postoperative nausea and vomiting)    Restrictive lung disease    Shortness of breath    with exertion    Sleep apnea    mask and oxygen at nite for sleep at 2L    Tobacco abuse    Umbilical hernia     ALLERGIES:  has No Known Allergies.  MEDICATIONS:  Current Outpatient Medications  Medication Sig Dispense Refill   acetaminophen (TYLENOL) 325 MG tablet Take 650 mg by mouth every 6 (six) hours as needed for headache (pain).     albuterol (VENTOLIN HFA) 108 (90 Base) MCG/ACT inhaler Inhale 2 puffs into the lungs every 6 (six) hours as needed for wheezing or shortness of breath. 8 g 3   aspirin 81 MG chewable tablet Chew 1 tablet (81 mg total) by mouth daily. 30 tablet 1   Budeson-Glycopyrrol-Formoterol (BREZTRI AEROSPHERE) 160-9-4.8 MCG/ACT AERO Inhale 2  puffs into the lungs daily. 5.9 g 5   Cholecalciferol (VITAMIN D3) 1.25 MG (50000 UT) TABS Take 1 tablet by mouth daily.     diltiazem (CARDIZEM CD) 240 MG 24 hr capsule TAKE 1 CAPSULE BY MOUTH EVERY DAY 90 capsule 0   furosemide (LASIX) 20 MG tablet Take 1.5 tablets (30 mg total) by mouth daily. 135 tablet 3   ipratropium-albuterol (DUONEB) 0.5-2.5 (3) MG/3ML SOLN Take 3 mLs by nebulization every 3 (three) hours as needed. 360 mL 11   letrozole (FEMARA) 2.5 MG tablet Take 1 tablet (2.5 mg total) by mouth daily. 90 tablet 3   lidocaine-prilocaine (EMLA) cream APPLY TO AFFECTED AREA ONCE AS DIRECTED 30 g 3   metFORMIN (GLUCOPHAGE-XR) 500 MG 24 hr  tablet TAKE 1 TABLET BY MOUTH DAILY WITH BREAKFAST. FOR DIABETES. 90 tablet 0   montelukast (SINGULAIR) 10 MG tablet Take 1 tablet (10 mg total) by mouth at bedtime. For allergies 90 tablet 3   OXYGEN Inhale 4 L/min into the lungs continuous.      potassium chloride (KLOR-CON M) 10 MEQ tablet Take 1 tablet (10 mEq total) by mouth daily. 90 tablet 3   rosuvastatin (CRESTOR) 5 MG tablet TAKE 1 TABLET BY MOUTH DAILY. FOR CHOLESTEROL. 90 tablet 3   Spacer/Aero-Holding Chambers (AEROCHAMBER MV) inhaler Use as instructed 1 each 0   No current facility-administered medications for this visit.    SURGICAL HISTORY:  Past Surgical History:  Procedure Laterality Date   APPLICATION OF WOUND VAC  03/19/2013   Procedure: APPLICATION OF WOUND VAC;  Surgeon: Madilyn Hook, DO;  Location: [facility];  Service: General;;   BREAST BIOPSY Left 09/29/2021   BREAST BIOPSY Left 10/18/2021   BREAST LUMPECTOMY Left 04/08/2020   BREAST LUMPECTOMY WITH RADIOACTIVE SEED AND SENTINEL LYMPH NODE BIOPSY Left 04/08/2020   Procedure: LEFT BREAST LUMPECTOMY WITH RADIOACTIVE SEED AND LEFT AXILLARY SENTINEL LYMPH NODE BIOPSY;  Surgeon: Alphonsa Overall, MD;  Location: [facility];  Service: General;  Laterality: Left;  PEC BLOCK, NEEDS ANESTHESIA CONSULT   CESAREAN SECTION  04/12/1985   COLONOSCOPY WITH PROPOFOL N/A 06/13/2016   Procedure: COLONOSCOPY WITH PROPOFOL;  Surgeon: Jerene Bears, MD;  Location: [facility];  Service: Gastroenterology;  Laterality: N/A;   ERCP N/A 02/15/2017   Procedure: ENDOSCOPIC RETROGRADE CHOLANGIOPANCREATOGRAPHY (ERCP);  Surgeon: [name], MD;  Location: [facility];  Service: Endoscopy;  Laterality: N/A;   INSERTION OF MESH N/A 03/19/2013   Procedure: INSERTION OF MESH;  Surgeon: Madilyn Hook, DO;  Location: [facility];  Service: General;  Laterality: N/A;   IR IMAGING GUIDED PORT INSERTION  03/26/2020   IR THORACENTESIS ASP PLEURAL SPACE W/IMG GUIDE  05/25/2020   VENTRAL HERNIA REPAIR N/A  03/19/2013   Procedure:  OPEN VENTRAL HERNIA REPAIR WITH MESH AND APPLICATION OF WOUND VAC;  Surgeon: Madilyn Hook, DO;  Location: [facility];  Service: General;  Laterality: N/A;   VIDEO BRONCHOSCOPY Bilateral 04/19/2018   Procedure: VIDEO BRONCHOSCOPY WITH FLUORO;  Surgeon: Rigoberto Noel, MD;  Location: [facility];  Service: Cardiopulmonary;  Laterality: Bilateral;    REVIEW OF SYSTEMS:  A comprehensive review of systems was negative except for: Respiratory: positive for dyspnea on exertion   PHYSICAL EXAMINATION: General appearance: alert, cooperative, fatigued, and no distress Head: Normocephalic, without obvious abnormality, atraumatic Neck: no adenopathy, no JVD, supple, symmetrical, trachea midline, and thyroid not enlarged, symmetric, no tenderness/mass/nodules Lymph nodes: Cervical, supraclavicular, and axillary nodes normal. Resp: clear to auscultation bilaterally Back: symmetric, no curvature.  ROM normal. No CVA tenderness. Cardio: regular rate and rhythm, S1, S2 normal, no murmur, click, rub or gallop GI: soft, non-tender; bowel sounds normal; no masses,  no organomegaly Extremities: extremities normal, atraumatic, no cyanosis or edema  ECOG PERFORMANCE STATUS: 1 - Symptomatic but completely ambulatory  Blood pressure 106/66, pulse (!) 108, temperature 97.7 F (36.5 C), temperature source Oral, weight 175 lb 6.4 oz (79.6 kg), SpO2 92 %.  LABORATORY DATA: Lab Results  Component Value Date   WBC 14.8 (H) 05/16/2022   HGB 11.8 (L) 05/16/2022   HCT 37.8 05/16/2022   MCV 82.4 05/16/2022   PLT 335 05/16/2022      Chemistry      Component Value Date/Time   NA 139 05/16/2022 1403   NA 143 11/23/2016 1504   K 4.1 05/16/2022 1403   CL 99 05/16/2022 1403   CO2 32 05/16/2022 1403   BUN 13 05/16/2022 1403   BUN 14 11/23/2016 1504   CREATININE 1.04 (H) 05/16/2022 1403      Component Value Date/Time   CALCIUM 9.1 05/16/2022 1403   ALKPHOS 67 05/16/2022 1403   AST 14 (L)  05/16/2022 1403   ALT 10 05/16/2022 1403   BILITOT 0.3 05/16/2022 1403       RADIOGRAPHIC STUDIES: CT Chest W Contrast  Result Date: 05/22/2022 CLINICAL DATA:  Non-small cell lung cancer, chemotherapy and radiation therapy complete. History of left breast cancer. * Tracking Code: BO * EXAM: CT CHEST WITH CONTRAST TECHNIQUE: Multidetector CT imaging of the chest was performed during intravenous contrast administration. RADIATION DOSE REDUCTION: This exam was performed according to the departmental dose-optimization program which includes automated exposure control, adjustment of the mA and/or kV according to patient size and/or use of iterative reconstruction technique. CONTRAST:  81mL OMNIPAQUE IOHEXOL 300 MG/ML  SOLN COMPARISON:  11/15/2021. FINDINGS: Cardiovascular: Right IJ Port-A-Cath terminates in the right atrium. Atherosclerotic calcification of the aorta and coronary arteries. Heart is enlarged. No pericardial effusion. Mediastinum/Nodes: No pathologically enlarged mediastinal, hilar or axillary lymph nodes. Left hilar soft tissue thickening as before. Esophagus is grossly unremarkable. Lungs/Pleura: Centrilobular emphysema. Cylindrical bronchiectasis, worst in the right lower lobe, where there is associated peribronchovascular nodularity and volume loss, similar. Improved associated mucoid impaction. Left perihilar post treatment consolidation and small left pleural effusion, stable. Debris is seen in the airway. Upper Abdomen: Liver may be mildly decreased in attenuation. Visualized portions of the liver, gallbladder, adrenal glands, kidneys, spleen, pancreas, stomach and bowel are otherwise grossly unremarkable. No upper abdominal adenopathy. Musculoskeletal: Degenerative changes in the spine. No worrisome lytic or sclerotic lesions. IMPRESSION: 1. Left perihilar post treatment consolidation and small left pleural effusion, stable. No evidence of metastatic disease. 2. Bronchiectasis,  peribronchovascular nodularity and subpleural consolidation in the right lower lobe, possibly due to previous or recurrent aspiration. 3. Liver may be mildly steatotic. 4. Aortic atherosclerosis (ICD10-I70.0). Coronary artery calcification. 5.  Emphysema (ICD10-J43.9). Electronically Signed   By: Lorin Picket M.D.   On: 05/22/2022 14:33     ASSESSMENT AND PLAN: This is a very pleasant 66 years old white female with stage IIIB non-small cell lung cancer, squamous cell carcinoma. She underwent a course of concurrent chemoradiation with weekly carboplatin and paclitaxel, status post 7 cycles.  She tolerated this treatment well with no concerning adverse effect except for fatigue. She underwent consolidation treatment with immunotherapy with Imfinzi status post 26 cycles. The patient was also diagnosed with a stage IIb left breast cancer s/p lumpectomy followed by adjuvant systemic  chemotherapy with Adriamycin, Cytoxan and Taxol.  She was followed by Dr. [name] at that time.  She was recently found to have recurrence of her disease and she is started on treatment with Femara 2.5 mg p.o. daily by Dr. [name] on 11/11/2021. The patient has been on observation for her lung cancer and she is feeling fine with no concerning complaints. She had repeat CT scan of the chest performed recently.  I personally and independently reviewed the scan and discussed the result with the patient and her husband.  Her scan showed no concerning findings for disease progression. I recommended for her to continue on observation with repeat CT scan of the chest in 6 months. Regarding the new finding on her left breast exam, she is followed by Dr. [name] and she will have further imaging studies for evaluation. She was advised to call immediately if she has any other concerning symptoms in the interval. The patient voices understanding of current disease status and treatment options and is in agreement with the current care  plan.  All questions were answered. The patient knows to call the clinic with any problems, questions or concerns. We can certainly see the patient much sooner if necessary.  Disclaimer: This note was dictated with voice recognition software. Similar sounding words can inadvertently be transcribed and may not be corrected upon review.

## 2022-05-30 ENCOUNTER — Encounter: Payer: Self-pay | Admitting: Primary Care

## 2022-05-30 ENCOUNTER — Ambulatory Visit: Payer: BC Managed Care – PPO | Admitting: Primary Care

## 2022-05-30 VITALS — BP 100/60 | HR 113 | Temp 98.8°F | Ht 63.0 in | Wt 176.0 lb

## 2022-05-30 DIAGNOSIS — E1165 Type 2 diabetes mellitus with hyperglycemia: Secondary | ICD-10-CM | POA: Diagnosis not present

## 2022-05-30 LAB — POCT GLYCOSYLATED HEMOGLOBIN (HGB A1C): Hemoglobin A1C: 5.5 % (ref 4.0–5.6)

## 2022-05-30 NOTE — Progress Notes (Signed)
Subjective:    Patient ID: Deborah [name], female    DOB: 1955-08-13, 66 y.o.   MRN: [id number]  HPI  Deborah [name] is a very pleasant 66 y.o. female with a significant medical history including CHF, hypertension, chronic respiratory failure, OSA, lung cancer, breast cancer, type 2 diabetes who presents today for follow-up of diabetes.  New diagnosis as of July 2023 with A1c of 6.5.  History of prediabetes intermittently previously.  Current medications include: metformin XR 500 mg daily  Last A1C: 6.5 in July 2023, 5.5 today. Last Eye Exam: UTD Last Foot Exam: Due, declines today  Pneumonia Vaccination: 2018 Urine Microalbumin: Due  Statin:  Dietary changes since last visit: She has significantly improved her diet by reducing intake of candy and cakes.    Exercise: None  BP Readings from Last 3 Encounters:  05/30/22 100/60  05/22/22 106/66  05/16/22 99/73       Review of Systems  Respiratory:  Positive for cough and shortness of breath.   Gastrointestinal:  Negative for abdominal pain and diarrhea.  Neurological:  Positive for numbness.         Past Medical History:  Diagnosis Date   Anemia    as teen   Asthma    Breast cancer ([place])    left breast cancer 02/2020   CHF (congestive heart failure) (HCC)    COPD, mild (HCC)    Depression    Diverticulitis    Family history of anesthesia complication    vomiting   GI bleeding    Heart failure ([place])    New onset 07/25/14   Histoplasmosis    left eye   Hyperkalemia    Hypertension    Lung cancer ([place]) 05/02/2018   s/p chemoradiation, immunotherapy   Obesity (BMI 30-39.9)    Pneumonia    dx wtih pneumonia on 05/27/16- seen by [facility]    PONV (postoperative nausea and vomiting)    Restrictive lung disease    Shortness of breath    with exertion    Sleep apnea    mask and oxygen at nite for sleep at 2L    Tobacco abuse    Umbilical hernia     Social History   Socioeconomic History   Marital  status: Married    Spouse name: Not on file   Number of children: 1   Years of education: Not on file   Highest education level: Not on file  Occupational History   Occupation: Retired    Fish farm manager: NOT EMPLOYED  Tobacco Use   Smoking status: Former    Packs/day: 3.00    Years: 43.00    Total pack years: 129.00    Types: Cigarettes    Quit date: 12/17/2012    Years since quitting: 9.4   Smokeless tobacco: Never  Vaping Use   Vaping Use: Former  Substance and Sexual Activity   Alcohol use: No    Alcohol/week: 0.0 standard drinks of alcohol   Drug use: No   Sexual activity: Not on file  Other Topics Concern   Not on file  Social History Narrative   ** Merged History Encounter **       ** Data from: 07/25/14 Enc Dept: [facility]       ** Data from: 06/05/13 Enc Dept: [facility]   Married, one son 97 yo.            Social Determinants of Health   Financial Resource Strain: Not on  file  Food Insecurity: Not on file  Transportation Needs: Unmet Transportation Needs (05/02/2018)   PRAPARE - Hydrologist (Medical): Yes    Lack of Transportation (Non-Medical): No  Physical Activity: Not on file  Stress: Not on file  Social Connections: Not on file  Intimate Partner Violence: Not on file    Past Surgical History:  Procedure Laterality Date   APPLICATION OF WOUND VAC  03/19/2013   Procedure: APPLICATION OF WOUND VAC;  Surgeon: Madilyn Hook, DO;  Location: [facility];  Service: General;;   BREAST BIOPSY Left 09/29/2021   BREAST BIOPSY Left 10/18/2021   BREAST LUMPECTOMY Left 04/08/2020   BREAST LUMPECTOMY WITH RADIOACTIVE SEED AND SENTINEL LYMPH NODE BIOPSY Left 04/08/2020   Procedure: LEFT BREAST LUMPECTOMY WITH RADIOACTIVE SEED AND LEFT AXILLARY SENTINEL LYMPH NODE BIOPSY;  Surgeon: Alphonsa Overall, MD;  Location: [facility];  Service: General;  Laterality: Left;  PEC BLOCK, NEEDS ANESTHESIA CONSULT   CESAREAN SECTION  04/12/1985    COLONOSCOPY WITH PROPOFOL N/A 06/13/2016   Procedure: COLONOSCOPY WITH PROPOFOL;  Surgeon: Jerene Bears, MD;  Location: [facility];  Service: Gastroenterology;  Laterality: N/A;   ERCP N/A 02/15/2017   Procedure: ENDOSCOPIC RETROGRADE CHOLANGIOPANCREATOGRAPHY (ERCP);  Surgeon: [name], MD;  Location: [facility];  Service: Endoscopy;  Laterality: N/A;   INSERTION OF MESH N/A 03/19/2013   Procedure: INSERTION OF MESH;  Surgeon: Madilyn Hook, DO;  Location: [facility];  Service: General;  Laterality: N/A;   IR IMAGING GUIDED PORT INSERTION  03/26/2020   IR THORACENTESIS ASP PLEURAL SPACE W/IMG GUIDE  05/25/2020   VENTRAL HERNIA REPAIR N/A 03/19/2013   Procedure:  OPEN VENTRAL HERNIA REPAIR WITH MESH AND APPLICATION OF WOUND VAC;  Surgeon: Madilyn Hook, DO;  Location[facility];  Service: General;  Laterality: N/A;   VIDEO BRONCHOSCOPY Bilateral 04/19/2018   Procedure: VIDEO BRONCHOSCOPY WITH FLUORO;  Surgeon: Rigoberto Noel, MD;  Location[facility];  Service: Cardiopulmonary;  Laterality: Bilateral;    Family History  Problem Relation Age of Onset   Heart attack Mother    Emphysema Mother        was a smoker   Congestive Heart Failure Father    Bipolar disorder Father    Lymphoma Maternal Uncle    Pancreatic cancer Maternal Aunt    Bipolar disorder Sister    Schizophrenia Sister    Other Sister        tumor in her neck   Melanoma Sister    Heart attack Maternal Uncle    Heart attack Maternal Uncle    Colon cancer Neg Hx    Breast cancer Neg Hx     No Known Allergies  Current Outpatient Medications on File Prior to Visit  Medication Sig Dispense Refill   acetaminophen (TYLENOL) 325 MG tablet Take 650 mg by mouth every 6 (six) hours as needed for headache (pain).     albuterol (VENTOLIN HFA) 108 (90 Base) MCG/ACT inhaler Inhale 2 puffs into the lungs every 6 (six) hours as needed for wheezing or shortness of breath. 8 g 3   aspirin 81 MG chewable tablet Chew 1 tablet (81  mg total) by mouth daily. 30 tablet 1   Budeson-Glycopyrrol-Formoterol (BREZTRI AEROSPHERE) 160-9-4.8 MCG/ACT AERO Inhale 2 puffs into the lungs daily. 5.9 g 5   Cholecalciferol (VITAMIN D3) 1.25 MG (50000 UT) TABS Take 1 tablet by mouth daily.     diltiazem (CARDIZEM CD) 240 MG 24 hr capsule TAKE  1 CAPSULE BY MOUTH EVERY DAY 90 capsule 0   furosemide (LASIX) 20 MG tablet Take 1.5 tablets (30 mg total) by mouth daily. 135 tablet 3   ipratropium-albuterol (DUONEB) 0.5-2.5 (3) MG/3ML SOLN Take 3 mLs by nebulization every 3 (three) hours as needed. 360 mL 11   letrozole (FEMARA) 2.5 MG tablet Take 1 tablet (2.5 mg total) by mouth daily. 90 tablet 3   lidocaine-prilocaine (EMLA) cream APPLY TO AFFECTED AREA ONCE AS DIRECTED 30 g 3   metFORMIN (GLUCOPHAGE-XR) 500 MG 24 hr tablet TAKE 1 TABLET BY MOUTH DAILY WITH BREAKFAST. FOR DIABETES. 90 tablet 0   montelukast (SINGULAIR) 10 MG tablet Take 1 tablet (10 mg total) by mouth at bedtime. For allergies 90 tablet 3   OXYGEN Inhale 4 L/min into the lungs continuous.      potassium chloride (KLOR-CON M) 10 MEQ tablet Take 1 tablet (10 mEq total) by mouth daily. 90 tablet 3   rosuvastatin (CRESTOR) 5 MG tablet TAKE 1 TABLET BY MOUTH DAILY. FOR CHOLESTEROL. 90 tablet 3   Spacer/Aero-Holding Chambers (AEROCHAMBER MV) inhaler Use as instructed 1 each 0   [DISCONTINUED] prochlorperazine (COMPAZINE) 10 MG tablet Take 1 tablet (10 mg total) by mouth every 6 (six) hours as needed (Nausea or vomiting). 30 tablet 1   No current facility-administered medications on file prior to visit.    BP 100/60   Pulse (!) 113   Temp 98.8 F (37.1 C) (Temporal)   Ht 5\' 3"  (1.6 m)   Wt 176 lb (79.8 kg)   SpO2 96% Comment: 2 L of o2  BMI 31.18 kg/m  Objective:   Physical Exam Constitutional:      General: She is not in acute distress. Cardiovascular:     Rate and Rhythm: Regular rhythm.  Pulmonary:     Effort: Pulmonary effort is normal.     Breath sounds: Wheezing  and rhonchi present.  Skin:    General: Skin is warm and dry.  Neurological:     Mental Status: She is alert.           Assessment & Plan:   Problem List Items Addressed This Visit       Endocrine   Type 2 diabetes mellitus with hyperglycemia ([place]) - Primary    Controlled with A1C of 5.5 today.   We both agreed to continue metformin XR 500 mg daily for now. She will continue to work on her diet.   She declines foot exam today. Discussed to schedule eye exam. Pneumonia vaccine UTD. Unable to provide urine sample for urine micoralbumia.   Follow up in July 2024.      Relevant Orders   POCT glycosylated hemoglobin (Hb A1C) (Completed)       Pleas Koch, NP

## 2022-05-30 NOTE — Assessment & Plan Note (Signed)
Controlled with A1C of 5.5 today.   We both agreed to continue metformin XR 500 mg daily for now. She will continue to work on her diet.   She declines foot exam today. Discussed to schedule eye exam. Pneumonia vaccine UTD. Unable to provide urine sample for urine micoralbumia.   Follow up in July 2024.

## 2022-06-02 ENCOUNTER — Ambulatory Visit
Admission: RE | Admit: 2022-06-02 | Discharge: 2022-06-02 | Disposition: A | Discharge status: Home / Self Care | Payer: BC Managed Care – PPO | Source: Ambulatory Visit | Attending: Hematology and Oncology | Admitting: Hematology and Oncology

## 2022-06-02 DIAGNOSIS — C3490 Malignant neoplasm of unspecified part of unspecified bronchus or lung: Secondary | ICD-10-CM

## 2022-06-02 DIAGNOSIS — C50912 Malignant neoplasm of unspecified site of left female breast: Secondary | ICD-10-CM | POA: Diagnosis not present

## 2022-06-02 DIAGNOSIS — R92322 Mammographic fibroglandular density, left breast: Secondary | ICD-10-CM | POA: Diagnosis not present

## 2022-06-06 ENCOUNTER — Telehealth: Payer: Self-pay | Admitting: Oncology

## 2022-06-06 NOTE — Telephone Encounter (Signed)
Called patient regarding all upcoming appointments, patient is notified.

## 2022-06-21 ENCOUNTER — Encounter: Payer: Self-pay | Admitting: Pulmonary Disease

## 2022-06-21 ENCOUNTER — Encounter: Payer: Self-pay | Admitting: Hematology and Oncology

## 2022-06-21 ENCOUNTER — Ambulatory Visit: Payer: BC Managed Care – PPO | Admitting: Pulmonary Disease

## 2022-06-21 VITALS — BP 124/64 | HR 120 | Temp 98.4°F | Wt 177.0 lb

## 2022-06-21 DIAGNOSIS — J9611 Chronic respiratory failure with hypoxia: Secondary | ICD-10-CM | POA: Diagnosis not present

## 2022-06-21 DIAGNOSIS — J432 Centrilobular emphysema: Secondary | ICD-10-CM

## 2022-06-21 NOTE — Patient Instructions (Addendum)
Next you again  Continue Breztri as prescribed  Please keep in the loop with upcoming oncology visit  Return to clinic in 6 months or sooner as needed with Dr. Silas Flood

## 2022-06-21 NOTE — Progress Notes (Signed)
@Patient  ID: Deborah [name], female    DOB: [birth year], 66 y.o.   MRN: [id number]  Chief Complaint  Patient presents with   Follow-up    Pt is here for follow up for emphysema. Pt is on 2L of oxygen on her tanks and she states she sometimes have to increase it as needed. Pt states that the Judithann Sauger is working well. Pt states she is moving around more while using the inhaler. Albuterol as needed     Referring provider: Pleas Koch, NP  HPI:    06/21/2022  - Visit  Returns for routine follow-up.  Reports ongoing good adherence to Breztri 2 puff twice daily.  Continues to see improvement with this.  Feels much better.  Unfortunately, the lump in her breast is gotten larger.  She meets with her oncologist tomorrow to discuss neck steps.  Sounds like possible plan for surgery.  Her oxygen is stable.  2 L at rest, 4 L with exertion.  No exacerbations in the interval since last visit.  Questionaires / Pulmonary Flowsheets:   ACT:      No data to display           MMRC: mMRC Dyspnea Scale mMRC Score  02/23/2020  5:13 PM 3    [place]:      No data to display           Tests:   FENO:  No results found for: "NITRICOXIDE"  PFT:    Latest Ref Rng & Units 02/18/2020    4:24 PM 01/01/2017    8:40 AM 06/05/2013    9:00 AM  PFT Results  FVC-Pre L 1.29  1.76    FVC-Predicted Pre % 40  54  54      FVC-Post L 1.33  1.88  1.96      FVC-Predicted Post % 42  58  59      Pre FEV1/FVC % % 74  75  82      Post FEV1/FCV % % 75  80  81      FEV1-Pre L 0.95  1.32  1.47      FEV1-Predicted Pre % 38  52  56      FEV1-Post L 1.00  1.50  1.58      DLCO uncorrected ml/min/mmHg 11.41  14.89  13.94      DLCO UNC% % 58  64  60      DLCO corrected ml/min/mmHg 11.41  14.98    DLCO COR %Predicted % 58  65    DLVA Predicted % 108  92  96      TLC L  4.16  4.13      TLC % Predicted %  84  84      RV % Predicted %  118  111         This result is from an external source.    Personally reviewed and interpreted as severe fixed obstruction, DLCO moderately reduced 02/2020 similar findings 12/2016 on my review and interpretation with addition of lung volumes demonstrating normal TLC, borderline but no evidence of air trapping, subsequent worsening FEV1 2021 to compare to 2023.  WALK:     04/20/2016    4:38 PM 03/24/2015    5:11 PM 08/26/2014   11:03 AM 04/17/2013    9:21 AM  SIX MIN WALK  Supplimental Oxygen during Test? (L/min)  No Yes No  O2 Flow Rate   2 L/min   Type  Continuous   2 Minute Oxygen Saturation % 93 %     2 Minute HR 125     4 Minute Oxygen Saturation % 93 %     4 Minute HR 132     6 Minute Oxygen Saturation % 92 %     6 Minute HR 154     Tech Comments:  stopped after 2nd lap due to fatigue, sob Pt had c/o sob after completion of 1st lap. Pt was able to complete 3 laps on 2 L 02.     Imaging: MM DIAG BREAST TOMO UNI LEFT  Result Date: 06/02/2022 CLINICAL DATA:  Evaluate known breast cancer. EXAM: DIGITAL DIAGNOSTIC UNILATERAL LEFT MAMMOGRAM WITH TOMOSYNTHESIS; ULTRASOUND LEFT BREAST LIMITED TECHNIQUE: Left digital diagnostic mammography and breast tomosynthesis was performed.; Targeted ultrasound examination of the left breast was performed. COMPARISON:  Previous exam(s). ACR Breast Density Category b: There are scattered areas of fibroglandular density. FINDINGS: The known breast cancer, containing a coil shaped clip, is visibly larger on today's mammogram. No other suspicious mammographic findings. On physical exam, there are mild skin changes overlying the known breast cancer. No ulceration. Targeted ultrasound is performed, showing a superficial mass containing a biopsy clip consistent with the patient's known breast cancer. The mass now measures 15 x 10 x 15 mm today versus 6 x 3 x 5 mm previously. The mass is now extending into the overlying skin. No axillary adenopathy. IMPRESSION: 1. The patient's known breast cancer is larger in the interval  and appears to be involving the overlying skin. No axillary adenopathy. RECOMMENDATION: Recommend follow-up with the patient's oncologist and surgeon for further planning. I have discussed the findings and recommendations with the patient. If applicable, a reminder letter will be sent to the patient regarding the next appointment. BI-RADS CATEGORY  6: Known biopsy-proven malignancy. Electronically Signed   By: Dorise Bullion III M.D.   On: 06/02/2022 14:33  US BREAST LTD UNI LEFT INC AXILLA  Result Date: 06/02/2022 CLINICAL DATA:  Evaluate known breast cancer. EXAM: DIGITAL DIAGNOSTIC UNILATERAL LEFT MAMMOGRAM WITH TOMOSYNTHESIS; ULTRASOUND LEFT BREAST LIMITED TECHNIQUE: Left digital diagnostic mammography and breast tomosynthesis was performed.; Targeted ultrasound examination of the left breast was performed. COMPARISON:  Previous exam(s). ACR Breast Density Category b: There are scattered areas of fibroglandular density. FINDINGS: The known breast cancer, containing a coil shaped clip, is visibly larger on today's mammogram. No other suspicious mammographic findings. On physical exam, there are mild skin changes overlying the known breast cancer. No ulceration. Targeted ultrasound is performed, showing a superficial mass containing a biopsy clip consistent with the patient's known breast cancer. The mass now measures 15 x 10 x 15 mm today versus 6 x 3 x 5 mm previously. The mass is now extending into the overlying skin. No axillary adenopathy. IMPRESSION: 1. The patient's known breast cancer is larger in the interval and appears to be involving the overlying skin. No axillary adenopathy. RECOMMENDATION: Recommend follow-up with the patient's oncologist and surgeon for further planning. I have discussed the findings and recommendations with the patient. If applicable, a reminder letter will be sent to the patient regarding the next appointment. BI-RADS CATEGORY  6: Known biopsy-proven malignancy.  Electronically Signed   By: Dorise Bullion III M.D.   On: 06/02/2022 14:33    Lab Results: Personally reviewed CBC    Component Value Date/Time   WBC 14.8 (H) 05/16/2022 1403   WBC 14.1 (H) 06/07/2020 0505   RBC 4.59 05/16/2022 1403  HGB 11.8 (L) 05/16/2022 1403   HCT 37.8 05/16/2022 1403   PLT 335 05/16/2022 1403   MCV 82.4 05/16/2022 1403   MCH 25.7 (L) 05/16/2022 1403   MCHC 31.2 05/16/2022 1403   RDW 16.5 (H) 05/16/2022 1403   LYMPHSABS 1.6 05/16/2022 1403   MONOABS 0.9 05/16/2022 1403   EOSABS 0.3 05/16/2022 1403   BASOSABS 0.1 05/16/2022 1403    BMET    Component Value Date/Time   NA 139 05/16/2022 1403   NA 143 11/23/2016 1504   K 4.1 05/16/2022 1403   CL 99 05/16/2022 1403   CO2 32 05/16/2022 1403   GLUCOSE 125 (H) 05/16/2022 1403   BUN 13 05/16/2022 1403   BUN 14 11/23/2016 1504   CREATININE 1.04 (H) 05/16/2022 1403   CALCIUM 9.1 05/16/2022 1403   GFRNONAA >60 05/16/2022 1403   GFRAA >60 04/02/2020 1400   GFRAA >60 01/26/2020 1019    BNP    Component Value Date/Time   BNP 280.0 (H) 05/26/2020 0505    ProBNP No results found for: "PROBNP"  Specialty Problems       Pulmonary Problems   Chronic respiratory failure ([place])    Followed in Pulmonary clinic/ [facility] - 04/17/2013  Walked RA x 1 laps @ 185 ft each stopped due to  desat 86% rapid pace  From resting sat 94%      Dyspnea    Followed in Pulmonary clinic/ [facility] - hfa 75% p coaching 04/17/2013  - PFT's 06/05/2013 no airflow obst, dlco 60 corrects to 96%      CRLD (chronic restrictive lung disease) secondary to super morbid obesity with mild COPD component   OSA (obstructive sleep apnea)    Auto CPAP 5-10  2 L oxygen blended      COPD exacerbation (HCC)   COPD (chronic obstructive pulmonary disease) ([place])   Primary malignant neoplasm of bronchus of left lower lobe ([place])   Acute on chronic respiratory failure with hypoxia ([place])   Acute hypoxemic  respiratory failure ([place])    No Known Allergies  Immunization History  Administered Date(s) Administered   Influenza Inj Mdck Quad Pf 04/22/2022   Influenza,inj,Quad PF,6+ Mos 04/09/2013, 07/27/2014, 04/18/2016, 05/28/2017, 04/03/2018, 04/08/2019, 04/22/2021   PFIZER(Purple Top)SARS-COV-2 Vaccination 09/28/2019, 10/18/2019   Pfizer Covid-19 Vaccine Bivalent Booster 43yrs & up 04/22/2021   Pneumococcal Conjugate-13 01/01/2017   Pneumococcal Polysaccharide-23 07/27/2014   Tdap 11/12/2017   Zoster Recombinat (Shingrix) 11/12/2019, 01/14/2020    Past Medical History:  Diagnosis Date   Anemia    as teen   Asthma    Breast cancer ([place])    left breast cancer 02/2020   CHF (congestive heart failure) (HCC)    COPD, mild (HCC)    Depression    Diverticulitis    Family history of anesthesia complication    vomiting   GI bleeding    Heart failure ([place])    New onset 07/25/14   Histoplasmosis    left eye   Hyperkalemia    Hypertension    Lung cancer ([place]) 05/02/2018   s/p chemoradiation, immunotherapy   Obesity (BMI 30-39.9)    Pneumonia    dx wtih pneumonia on 05/27/16- seen by [facility]    PONV (postoperative nausea and vomiting)    Restrictive lung disease    Shortness of breath    with exertion    Sleep apnea    mask and oxygen at nite for sleep at 2L    Tobacco abuse  Umbilical hernia     Tobacco History: Social History   Tobacco Use  Smoking Status Former   Packs/day: 3.00   Years: 43.00   Total pack years: 129.00   Types: Cigarettes   Quit date: 12/17/2012   Years since quitting: 9.5  Smokeless Tobacco Never   Counseling given: Not Answered   Continue to not smoke  Outpatient Encounter Medications as of 06/21/2022  Medication Sig   acetaminophen (TYLENOL) 325 MG tablet Take 650 mg by mouth every 6 (six) hours as needed for headache (pain).   albuterol (VENTOLIN HFA) 108 (90 Base) MCG/ACT inhaler Inhale 2 puffs into the lungs every 6 (six) hours as needed  for wheezing or shortness of breath.   aspirin 81 MG chewable tablet Chew 1 tablet (81 mg total) by mouth daily.   Budeson-Glycopyrrol-Formoterol (BREZTRI AEROSPHERE) 160-9-4.8 MCG/ACT AERO Inhale 2 puffs into the lungs daily.   Cholecalciferol (VITAMIN D3) 1.25 MG (50000 UT) TABS Take 1 tablet by mouth daily.   diltiazem (CARDIZEM CD) 240 MG 24 hr capsule TAKE 1 CAPSULE BY MOUTH EVERY DAY   furosemide (LASIX) 20 MG tablet Take 1.5 tablets (30 mg total) by mouth daily.   ipratropium-albuterol (DUONEB) 0.5-2.5 (3) MG/3ML SOLN Take 3 mLs by nebulization every 3 (three) hours as needed.   letrozole (FEMARA) 2.5 MG tablet Take 1 tablet (2.5 mg total) by mouth daily.   lidocaine-prilocaine (EMLA) cream APPLY TO AFFECTED AREA ONCE AS DIRECTED   metFORMIN (GLUCOPHAGE-XR) 500 MG 24 hr tablet TAKE 1 TABLET BY MOUTH DAILY WITH BREAKFAST. FOR DIABETES.   montelukast (SINGULAIR) 10 MG tablet Take 1 tablet (10 mg total) by mouth at bedtime. For allergies   OXYGEN Inhale 4 L/min into the lungs continuous.    potassium chloride (KLOR-CON M) 10 MEQ tablet Take 1 tablet (10 mEq total) by mouth daily.   rosuvastatin (CRESTOR) 5 MG tablet TAKE 1 TABLET BY MOUTH DAILY. FOR CHOLESTEROL.   Spacer/Aero-Holding Chambers (AEROCHAMBER MV) inhaler Use as instructed   [DISCONTINUED] prochlorperazine (COMPAZINE) 10 MG tablet Take 1 tablet (10 mg total) by mouth every 6 (six) hours as needed (Nausea or vomiting).   No facility-administered encounter medications on file as of 06/21/2022.     Review of Systems  Review of Systems  N/A Physical Exam  BP 124/64 (BP Location: Left Arm, Patient Position: Sitting, Cuff Size: Normal)   Pulse (!) 120   Temp 98.4 F (36.9 C) (Oral)   Wt 177 lb (80.3 kg)   SpO2 98%   BMI 31.35 kg/m   Wt Readings from Last 5 Encounters:  06/21/22 177 lb (80.3 kg)  05/30/22 176 lb (79.8 kg)  05/22/22 175 lb 6.4 oz (79.6 kg)  05/16/22 177 lb 11.2 oz (80.6 kg)  04/26/22 176 lb 3.7 oz  (79.9 kg)    BMI Readings from Last 5 Encounters:  06/21/22 31.35 kg/m  05/30/22 31.18 kg/m  05/22/22 31.07 kg/m  05/16/22 31.48 kg/m  04/26/22 31.22 kg/m     Physical Exam General: Chronically ill-appearing, in wheelchair, no acute distress Neck: Habitus precludes JVP evaluation, supple Eyes: EOMI, no icterus Respiratory: Distant breath sounds, mild wheezing left greater than right Abdomen: Obese, bowel sounds present MSK: No synovitis, no joint effusion Neuro: No weakness, no sensation deficit Psych: Normal mood, full affect   Assessment & Plan:   Severe COPD: PFT 2021 with FEV1 just over 30% after bronchodilator.  No significant bronchodilator response.  Not using inhaler medicine, maintenance inhaler.  Symptoms improved with Judithann Sauger  2 puffs daily-advised to continue, if dyspnea were to worsen increase to twice daily.  Chronic hypoxic respiratory failure: Most likely related to severe COPD.  Has some cardiovascular issues as well.  To continue 4 L as prescribed.    Preoperative evaluation: Pulmonary medicine does not provide surgical clearance but rather a preoperative risk assessment.  Based on the ARISCAT model, assuming duration of surgery is 3 hours or less, patient is low risk 1.3% risk of postoperative pulmonary complication.  If duration of surgery is greater than 3 hours, patient is intermediate at 13.3% risk of postoperative pulmonary complication.  There is no modifiable risk factors to address prior to surgery.  Return in about 6 months (around 12/21/2022).   Lanier Clam, MD 06/21/2022

## 2022-06-22 ENCOUNTER — Encounter: Payer: Self-pay | Admitting: Adult Health

## 2022-06-22 ENCOUNTER — Other Ambulatory Visit: Payer: Self-pay

## 2022-06-22 ENCOUNTER — Inpatient Hospital Stay: Payer: BC Managed Care – PPO | Attending: Internal Medicine | Admitting: Adult Health

## 2022-06-22 VITALS — BP 120/76 | HR 122 | Temp 98.1°F | Resp 18 | Ht 63.0 in | Wt 173.0 lb

## 2022-06-22 DIAGNOSIS — Z5111 Encounter for antineoplastic chemotherapy: Secondary | ICD-10-CM | POA: Diagnosis not present

## 2022-06-22 DIAGNOSIS — Z171 Estrogen receptor negative status [ER-]: Secondary | ICD-10-CM | POA: Insufficient documentation

## 2022-06-22 DIAGNOSIS — C50412 Malignant neoplasm of upper-outer quadrant of left female breast: Secondary | ICD-10-CM | POA: Insufficient documentation

## 2022-06-22 NOTE — Progress Notes (Signed)
[name] Follow up:    Pleas Koch, NP [place] [address]   DIAGNOSIS:  Cancer Staging  Malignant neoplasm of upper-outer quadrant of left breast in female, estrogen receptor negative ([place]) Staging form: Breast, AJCC 8th Edition - Clinical stage from 02/19/2020: Stage IIB (cT2, cN0, cM0, G3, ER-, PR-, HER2-) - Signed by Gardenia Phlegm, NP on 02/25/2020 Stage prefix: Initial diagnosis Histologic grading system: 3 grade system  Primary malignant neoplasm of bronchus of left lower lobe ([place]) Staging form: Lung, AJCC 8th Edition - Clinical: Stage IIIB (cT3, cN2, cM0) - Signed by Curt Bears, MD on 11/17/2021   SUMMARY OF ONCOLOGIC HISTORY: Oncology History  Primary malignant neoplasm of bronchus of left lower lobe ([place])  05/02/2018 Initial Diagnosis   Stage III squamous cell carcinoma of left lung ([place])   05/13/2018 - 06/24/2018 Chemotherapy   The patient had palonosetron (ALOXI) injection 0.25 mg, 0.25 mg, Intravenous,  Once, 7 of 7 cycles Administration: 0.25 mg (05/13/2018), 0.25 mg (06/10/2018), 0.25 mg (06/17/2018), 0.25 mg (05/20/2018), 0.25 mg (06/24/2018), 0.25 mg (05/27/2018), 0.25 mg (06/03/2018) CARBOplatin (PARAPLATIN) 300 mg in sodium chloride 0.9 % 250 mL chemo infusion, 300 mg (100 % of original dose 300 mg), Intravenous,  Once, 7 of 7 cycles Dose modification: 300 mg (original dose 300 mg, Cycle 1) Administration: 300 mg (05/13/2018), 300 mg (06/10/2018), 260 mg (06/17/2018), 300 mg (05/20/2018), 300 mg (06/24/2018), 300 mg (05/27/2018), 300 mg (06/03/2018) PACLitaxel (TAXOL) 96 mg in sodium chloride 0.9 % 250 mL chemo infusion (</= 92m/m2), 45 mg/m2 = 96 mg, Intravenous,  Once, 7 of 7 cycles Administration: 96 mg (05/13/2018), 96 mg (06/10/2018), 96 mg (06/17/2018), 96 mg (05/20/2018), 96 mg (06/24/2018), 96 mg (05/27/2018), 96 mg (06/03/2018)  for chemotherapy treatment.    07/31/2018 - 07/15/2019 Chemotherapy   The  patient had durvalumab (IMFINZI) 1,000 mg in sodium chloride 0.9 % 100 mL chemo infusion, 1,020 mg, Intravenous,  Once, 26 of 26 cycles Administration: 1,000 mg (07/31/2018), 1,000 mg (10/22/2018), 1,000 mg (11/05/2018), 1,000 mg (11/19/2018), 1,000 mg (12/03/2018), 1,000 mg (08/14/2018), 1,000 mg (12/17/2018), 1,000 mg (08/27/2018), 1,000 mg (09/10/2018), 1,000 mg (09/24/2018), 1,000 mg (10/11/2018), 1,000 mg (12/31/2018), 1,000 mg (01/14/2019), 1,120 mg (01/28/2019), 1,120 mg (02/11/2019), 1,120 mg (02/25/2019), 1,120 mg (03/11/2019), 1,120 mg (03/25/2019), 1,120 mg (04/08/2019), 1,120 mg (04/22/2019), 1,120 mg (05/06/2019), 1,120 mg (05/20/2019), 1,120 mg (06/03/2019), 1,120 mg (06/17/2019), 1,120 mg (07/01/2019), 1,120 mg (07/15/2019)  for chemotherapy treatment.    05/11/2020 - 05/13/2020 Chemotherapy   Received 1 cycle of Adriamycin and Cytoxan and discontinued it   11/17/2021 Cancer Staging   Staging form: Lung, AJCC 8th Edition - Clinical: Stage IIIB (cT3, cN2, cM0) - Signed by MCurt Bears MD on 11/17/2021   Malignant neoplasm of upper-outer quadrant of left breast in female, estrogen receptor negative ([place]  02/19/2020 Cancer Staging   Staging form: Breast, AJCC 8th Edition - Clinical stage from 02/19/2020: Stage IIB (cT2, cN0, cM0, G3, ER-, PR-, HER2-) - Signed by CGardenia Phlegm NP on 02/25/2020   02/19/2020 Initial Diagnosis   Chest CT for lung cancer follow-up showed a left breast module. Mammogram and UKore[place]owed a 3.7cm mass with calcifications at the 2 o'clock position in the left breast, no left axillary adenopathy. Biopsy showed IDC, grade 3, HER-2 negative (0), ER/PR negative, Ki67 40%.   04/08/2020 Surgery   Left lumpectomy ([name]: three foci of IDC, grade 3, 3.3cm, 1.0cm, 0.7cm, with high grade DCIS, clear  margins, 2 left axillary lymph nodes negative for carcinoma.    05/11/2020 - 05/13/2020 Chemotherapy   Received 1 cycle of Adriamycin and Cytoxan and discontinued it   10/18/2021  Relapse/Recurrence   Mammogram detected indeterminate left breast mass at 12 o'clock position measuring 6 mm, 2 focal areas of calcifications (H 8 mm) within the left breast favored to be fat necrosis. The 6 mm mass on biopsy came back as grade 3 IDC ER 30% weak, PR 0%, HER2 negative, Ki-67 20%     CURRENT THERAPY: To start fulvestrant  INTERVAL HISTORY: Deborah [name] 66 y.o. female returns for follow-up of her left-sided breast cancer.  She was diagnosed with a breast cancer recurrence back in April 2023 and began on letrozole.  She was felt to be a poor surgical candidate and after discussion with Dr. Brantley Stage and Dr. [name] she proceeded with medical therapy with letrozole.  Her most recent mammogram and ultrasound was completed on June 02, 2022 demonstrating progression of the tumor size.  She is here today for follow-up, evaluation and discussion of next steps.  That she has tolerated the letrozole well and did not notice any side effects such as arthralgias, hot flashes, or vaginal dryness.  She has multiple comorbidities due to her history of stage III lung cancer, COPD, CHF, morbid obesity, type 2 diabetes, pulmonary hypertension.  Patient Active Problem List   Diagnosis Date Noted   Preventative health care 02/02/2022   Hyperlipidemia 02/02/2022   Nodule of skin of left lower leg 01/27/2021   Type 2 diabetes mellitus with hyperglycemia ([place]) 12/08/2020   Diverticulosis of colon with hemorrhage    GI bleed 06/05/2020   Acute GI bleeding 06/04/2020   Acute blood loss anemia 06/04/2020   Acute hypoxemic respiratory failure [facility])    Advanced care planning/counseling discussion    Palliative care by specialist    Acute on chronic respiratory failure with hypoxia ([place]) 05/24/2020   Port-A-Cath in place 05/11/2020   Malignant neoplasm of upper-outer quadrant of left breast in female, estrogen receptor negative ([place]) 02/25/2020   Surgical counseling visit 02/23/2020   Physical  deconditioning 02/23/2020   Rash of face 12/16/2019   Hyperglycemia 11/12/2019   Palpitations 11/12/2019   Encounter for antineoplastic immunotherapy 08/14/2018   Primary malignant neoplasm of bronchus of left lower lobe ([place]) 05/02/2018   Encounter for antineoplastic chemotherapy 05/02/2018   Goals of care, counseling/discussion 05/02/2018   Choledocholithiasis    COPD (chronic obstructive pulmonary disease) ([place]) 01/01/2017   Special screening for malignant neoplasms, colon    Benign neoplasm of cecum    Benign neoplasm of ascending colon    Benign neoplasm of descending colon    Benign neoplasm of sigmoid colon    Depression 04/20/2016   GI bleeding 01/08/2016   Pressure ulcer 01/08/2016   Diverticulitis of colon 01/08/2016   Severe sepsis (HCC)    COPD exacerbation ([place]) 12/18/2015   Vitamin D deficiency 03/29/2015   Fatigue 10/27/2014   Chronic diastolic heart failure ([place]) 09/01/2014   OSA (obstructive sleep apnea) 08/26/2014   Hypoxemia    Pulmonary HTN (H[facility])    CHF, acute on chronic, unknown EF 07/25/2014   CRLD (chronic restrictive lung disease) secondary to super morbid obesity with mild COPD component 07/25/2014   Tobacco abuse disorder 07/25/2014   Morbid obesity ([place]) 06/05/2013   Dyspnea 04/17/2013   Chronic respiratory failure ([place]) 04/17/2013   Hypertension 78/67/6720   Umbilical hernia     has No Known Allergies.  MEDICAL HISTORY: Past Medical History:  Diagnosis Date   Anemia    as teen   Asthma    Breast cancer ([place])    left breast cancer 02/2020   CHF (congestive heart failure) (HCC)    COPD, mild (H[facility])    Depression    Diverticulitis    Family history of anesthesia complication    vomiting   GI bleeding    Heart failure ([place])    New onset 07/25/14   Histoplasmosis    left eye   Hyperkalemia    Hypertension    Lung cancer ([place]) 05/02/2018   s/p chemoradiation, immunotherapy   Obesity (BMI 30-39.9)    Pneumonia    dx wtih pneumonia on  05/27/16- seen by [facility]    PONV (postoperative nausea and vomiting)    Restrictive lung disease    Shortness of breath    with exertion    Sleep apnea    mask and oxygen at nite for sleep at 2L    Tobacco abuse    Umbilical hernia     SURGICAL HISTORY: Past Surgical History:  Procedure Laterality Date   APPLICATION OF WOUND VAC  03/19/2013   Procedure: APPLICATION OF WOUND VAC;  Surgeon: Madilyn Hook, DO;  Location: [facility];  Service: General;;   BREAST BIOPSY Left 09/29/2021   BREAST BIOPSY Left 10/18/2021   BREAST LUMPECTOMY Left 04/08/2020   BREAST LUMPECTOMY WITH RADIOACTIVE SEED AND SENTINEL LYMPH NODE BIOPSY Left 04/08/2020   Procedure: LEFT BREAST LUMPECTOMY WITH RADIOACTIVE SEED AND LEFT AXILLARY SENTINEL LYMPH NODE BIOPSY;  Surgeon: Alphonsa Overall, MD;  Location: [facility];  Service: General;  Laterality: Left;  PEC BLOCK, NEEDS ANESTHESIA CONSULT   CESAREAN SECTION  04/12/1985   COLONOSCOPY WITH PROPOFOL N/A 06/13/2016   Procedure: COLONOSCOPY WITH PROPOFOL;  Surgeon: Jerene Bears, MD;  [facility];  Service: Gastroenterology;  Laterality: N/A;   ERCP N/A 02/15/2017   Procedure: ENDOSCOPIC RETROGRADE CHOLANGIOPANCREATOGRAPHY (ERCP);  Surgeon: [name], MD;  Location: [facility];  Service: Endoscopy;  Laterality: N/A;   INSERTION OF MESH N/A 03/19/2013   Procedure: INSERTION OF MESH;  Surgeon: Madilyn Hook, DO;  [facility];  Service: General;  Laterality: N/A;   IR IMAGING GUIDED PORT INSERTION  03/26/2020   IR THORACENTESIS ASP PLEURAL SPACE W/IMG GUIDE  05/25/2020   VENTRAL HERNIA REPAIR N/A 03/19/2013   Procedure:  OPEN VENTRAL HERNIA REPAIR WITH MESH AND APPLICATION OF WOUND VAC;  Surgeon: Madilyn Hook, DO;  [facility];  Service: General;  Laterality: N/A;   VIDEO BRONCHOSCOPY Bilateral 04/19/2018   Procedure: VIDEO BRONCHOSCOPY WITH FLUORO;  Surgeon: Rigoberto Noel, MD;  [facility];  Service: Cardiopulmonary;  Laterality:  Bilateral;    SOCIAL HISTORY: Social History   Socioeconomic History   Marital status: Married    Spouse name: Not on file   Number of children: 1   Years of education: Not on file   Highest education level: Not on file  Occupational History   Occupation: Retired    Fish farm manager: NOT EMPLOYED  Tobacco Use   Smoking status: Former    Packs/day: 3.00    Years: 43.00    Total pack years: 129.00    Types: Cigarettes    Quit date: 12/17/2012    Years since quitting: 9.5   Smokeless tobacco: Never  Vaping Use   Vaping Use: Former  Substance and Sexual Activity   Alcohol use: No    Alcohol/week: 0.0 standard  drinks of alcohol   Drug use: No   Sexual activity: Not on file  Other Topics Concern   Not on file  Social History Narrative   ** Merged History Encounter **       ** Data from: 07/25/14 Enc Dept: [facility]       ** Data from: 06/05/13 Enc Dept: [facility]   Married, one son 81 yo.            Social Determinants of Health   Financial Resource Strain: Not on file  Food Insecurity: Not on file  Transportation Needs: Unmet Transportation Needs (05/02/2018)   PRAPARE - Hydrologist (Medical): Yes    Lack of Transportation (Non-Medical): No  Physical Activity: Not on file  Stress: Not on file  Social Connections: Not on file  Intimate Partner Violence: Not on file    FAMILY HISTORY: Family History  Problem Relation Age of Onset   Heart attack Mother    Emphysema Mother        was a smoker   Congestive Heart Failure Father    Bipolar disorder Father    Lymphoma Maternal Uncle    Pancreatic cancer Maternal Aunt    Bipolar disorder Sister    Schizophrenia Sister    Other Sister        tumor in her neck   Melanoma Sister    Heart attack Maternal Uncle    Heart attack Maternal Uncle    Colon cancer Neg Hx    Breast cancer Neg Hx     Review of Systems  Constitutional:  Positive for fatigue. Negative for appetite  change, chills, fever and unexpected weight change.  HENT:   Negative for hearing loss, lump/mass and trouble swallowing.   Eyes:  Negative for eye problems and icterus.  Respiratory:  Negative for chest tightness, cough and shortness of breath.   Cardiovascular:  Negative for chest pain, leg swelling and palpitations.  Gastrointestinal:  Negative for abdominal distention, abdominal pain, constipation, diarrhea, nausea and vomiting.  Endocrine: Negative for hot flashes.  Genitourinary:  Negative for difficulty urinating.   Musculoskeletal:  Negative for arthralgias.  Skin:  Negative for itching and rash.  Neurological:  Negative for dizziness, extremity weakness, headaches and numbness.  Hematological:  Negative for adenopathy. Does not bruise/bleed easily.  Psychiatric/Behavioral:  Negative for depression. The patient is not nervous/anxious.       PHYSICAL EXAMINATION  ECOG PERFORMANCE STATUS: 3 - Symptomatic, >50% confined to bed  Vitals:   06/22/22 1042  BP: 120/76  Pulse: (!) 122  Resp: 18  Temp: 98.1 F (36.7 C)  SpO2: 99%    Physical Exam Constitutional:      General: She is not in acute distress.    Appearance: Normal appearance. She is ill-appearing. She is not toxic-appearing.     Comments: She appears chronically ill today and was examined in a wheelchair.  HENT:     Head: Normocephalic and atraumatic.  Eyes:     General: No scleral icterus. Cardiovascular:     Rate and Rhythm: Normal rate and regular rhythm.     Pulses: Normal pulses.     Heart sounds: Normal heart sounds.  Pulmonary:     Effort: Pulmonary effort is normal.     Breath sounds: Normal breath sounds.  Abdominal:     General: Abdomen is flat. Bowel sounds are normal. There is no distension.     Palpations: Abdomen is soft.  Tenderness: There is no abdominal tenderness.  Musculoskeletal:        General: No swelling.     Cervical back: Neck supple.  Lymphadenopathy:     Cervical: No  cervical adenopathy.  Skin:    General: Skin is warm and dry.     Findings: No rash.  Neurological:     General: No focal deficit present.     Mental Status: She is alert.  Psychiatric:        Mood and Affect: Mood normal.        Behavior: Behavior normal.   Left breast on June 22, 2022 Measurement is about 2 cm in size   LABORATORY DATA: None for this visit   ASSESSMENT and THERAPY PLAN:   Malignant neoplasm of upper-outer quadrant of left breast in female, estrogen receptor negative ([place]) Mylan is a 66 year old woman with worsening left-sided estrogen positive breast cancer.  She has recently progressed on treatment with letrozole.  I reviewed the above with Dr. [name] and discussed with the patient the plan as follows:  She will see Dr. Brantley Stage and I sent him a message today to see when he could get her back in to discuss whether surgery is an option. We will start Faslodex next week.  We discussed risks and benefits of this injection and she is in agreement to proceed.  I gave her additional information about the injection in her after visit summary.  She will stop taking the letrozole. We will see her for follow-up on December 28 when she receives her second injection.  She knows to call if she has any questions or needs a sooner appointment.  All questions were answered. The patient knows to call the clinic with any problems, questions or concerns. We can certainly see the patient much sooner if necessary.  Total encounter time:20 minutes*in face-to-face visit time, chart review, lab review, care coordination, order entry, and documentation of the encounter time.    Wilber Bihari, NP 06/22/22 11:41 AM Medical Oncology and Hematology [facility], [place] [address] Tel. [phone]    Fax. [phone]  *Total Encounter Time as defined by the Centers for Medicare and Medicaid Services includes, in addition to the face-to-face time  of a patient visit (documented in the note above) non-face-to-face time: obtaining and reviewing outside history, ordering and reviewing medications, tests or procedures, care coordination (communications with other health care professionals or caregivers) and documentation in the medical record.

## 2022-06-22 NOTE — Assessment & Plan Note (Signed)
Deborah [name] is a 66 year old woman with worsening left-sided estrogen positive breast cancer.  She has recently progressed on treatment with letrozole.  I reviewed the above with Dr. [name] and discussed with the patient the plan as follows:  She will see Dr. Brantley Stage and I sent him a message today to see when he could get her back in to discuss whether surgery is an option. We will start Faslodex next week.  We discussed risks and benefits of this injection and she is in agreement to proceed.  I gave her additional information about the injection in her after visit summary.  She will stop taking the letrozole. We will see her for follow-up on December 28 when she receives her second injection.  She knows to call if she has any questions or needs a sooner appointment.

## 2022-06-22 NOTE — Patient Instructions (Signed)

## 2022-06-26 ENCOUNTER — Encounter: Payer: Self-pay | Admitting: Internal Medicine

## 2022-06-26 ENCOUNTER — Telehealth: Payer: Self-pay | Admitting: Internal Medicine

## 2022-06-26 NOTE — Telephone Encounter (Signed)
Called patient to r/s appointment in December to see main provider. Patient notified.

## 2022-06-29 ENCOUNTER — Telehealth: Payer: Self-pay | Admitting: Hematology and Oncology

## 2022-06-29 NOTE — Telephone Encounter (Signed)
Called patient to clarify upcoming appointments. Patient notified and OK this appointment times.

## 2022-06-30 ENCOUNTER — Ambulatory Visit: Payer: BC Managed Care – PPO | Admitting: Medical

## 2022-06-30 ENCOUNTER — Inpatient Hospital Stay: Payer: BC Managed Care – PPO

## 2022-06-30 VITALS — BP 113/70 | HR 102 | Temp 98.7°F | Resp 18

## 2022-06-30 DIAGNOSIS — Z171 Estrogen receptor negative status [ER-]: Secondary | ICD-10-CM | POA: Diagnosis not present

## 2022-06-30 DIAGNOSIS — C50412 Malignant neoplasm of upper-outer quadrant of left female breast: Secondary | ICD-10-CM | POA: Diagnosis not present

## 2022-06-30 DIAGNOSIS — Z5111 Encounter for antineoplastic chemotherapy: Secondary | ICD-10-CM | POA: Diagnosis not present

## 2022-06-30 DIAGNOSIS — Z95828 Presence of other vascular implants and grafts: Secondary | ICD-10-CM

## 2022-06-30 MED ORDER — FULVESTRANT 250 MG/5ML IM SOSY
500.0000 mg | PREFILLED_SYRINGE | Freq: Once | INTRAMUSCULAR | Status: AC
  Administered 2022-06-30: 500 mg via INTRAMUSCULAR
  Filled 2022-06-30: qty 10

## 2022-07-02 ENCOUNTER — Encounter: Payer: Self-pay | Admitting: Pulmonary Disease

## 2022-07-03 ENCOUNTER — Inpatient Hospital Stay: Payer: BC Managed Care – PPO

## 2022-07-05 ENCOUNTER — Other Ambulatory Visit: Payer: Self-pay

## 2022-07-05 ENCOUNTER — Inpatient Hospital Stay: Payer: BC Managed Care – PPO

## 2022-07-13 ENCOUNTER — Inpatient Hospital Stay: Payer: BC Managed Care – PPO

## 2022-07-13 ENCOUNTER — Ambulatory Visit: Payer: BC Managed Care – PPO

## 2022-07-13 ENCOUNTER — Encounter: Payer: Self-pay | Admitting: Nurse Practitioner

## 2022-07-13 ENCOUNTER — Ambulatory Visit: Payer: BC Managed Care – PPO | Admitting: Internal Medicine

## 2022-07-13 ENCOUNTER — Ambulatory Visit: Payer: BC Managed Care – PPO | Admitting: Oncology

## 2022-07-13 ENCOUNTER — Inpatient Hospital Stay: Payer: BC Managed Care – PPO | Admitting: Nurse Practitioner

## 2022-07-13 VITALS — BP 121/71 | HR 103 | Temp 98.0°F | Resp 22 | Wt 172.1 lb

## 2022-07-13 DIAGNOSIS — Z5111 Encounter for antineoplastic chemotherapy: Secondary | ICD-10-CM | POA: Diagnosis not present

## 2022-07-13 DIAGNOSIS — C50412 Malignant neoplasm of upper-outer quadrant of left female breast: Secondary | ICD-10-CM

## 2022-07-13 DIAGNOSIS — Z171 Estrogen receptor negative status [ER-]: Secondary | ICD-10-CM

## 2022-07-13 DIAGNOSIS — C3490 Malignant neoplasm of unspecified part of unspecified bronchus or lung: Secondary | ICD-10-CM

## 2022-07-13 DIAGNOSIS — Z95828 Presence of other vascular implants and grafts: Secondary | ICD-10-CM

## 2022-07-13 MED ORDER — FULVESTRANT 250 MG/5ML IM SOSY: 500.0000 mg | PREFILLED_SYRINGE | Freq: Once | INTRAMUSCULAR | Status: DC

## 2022-07-13 MED ORDER — FULVESTRANT 250 MG/5ML IM SOSY
500.0000 mg | PREFILLED_SYRINGE | Freq: Once | INTRAMUSCULAR | Status: AC
  Administered 2022-07-13: 500 mg via INTRAMUSCULAR
  Filled 2022-07-13: qty 10

## 2022-07-13 MED ORDER — HEPARIN SOD (PORK) LOCK FLUSH 100 UNIT/ML IV SOLN
500.0000 [IU] | Freq: Once | INTRAVENOUS | Status: AC
  Administered 2022-07-13: 500 [IU]

## 2022-07-13 MED ORDER — SODIUM CHLORIDE 0.9% FLUSH
10.0000 mL | Freq: Once | INTRAVENOUS | Status: AC
  Administered 2022-07-13: 10 mL

## 2022-07-13 NOTE — Progress Notes (Signed)
[place]   [phone] [phone]   Clinic Follow up Note   Patient Care Team: Pleas Koch, NP as PCP - General (Internal Medicine) [name], MD as PCP - Cardiology (Cardiology) [name], MD as Consulting Physician (Cardiology) Erroll Luna, MD as Consulting Physician (General Surgery) Nicholas Lose, MD as Consulting Physician (Hematology and Oncology) 07/13/2022  CHIEF COMPLAINT: Follow up recurrent left breast cancer, h/o lung cancer   SUMMARY OF ONCOLOGIC HISTORY: Oncology History  Primary malignant neoplasm of bronchus of left lower lobe ([place])  05/02/2018 Initial Diagnosis   Stage III squamous cell carcinoma of left lung ([place])   05/13/2018 - 06/24/2018 Chemotherapy   The patient had palonosetron (ALOXI) injection 0.25 mg, 0.25 mg, Intravenous,  Once, 7 of 7 cycles Administration: 0.25 mg (05/13/2018), 0.25 mg (06/10/2018), 0.25 mg (06/17/2018), 0.25 mg (05/20/2018), 0.25 mg (06/24/2018), 0.25 mg (05/27/2018), 0.25 mg (06/03/2018) CARBOplatin (PARAPLATIN) 300 mg in sodium chloride 0.9 % 250 mL chemo infusion, 300 mg (100 % of original dose 300 mg), Intravenous,  Once, 7 of 7 cycles Dose modification: 300 mg (original dose 300 mg, Cycle 1) Administration: 300 mg (05/13/2018), 300 mg (06/10/2018), 260 mg (06/17/2018), 300 mg (05/20/2018), 300 mg (06/24/2018), 300 mg (05/27/2018), 300 mg (06/03/2018) PACLitaxel (TAXOL) 96 mg in sodium chloride 0.9 % 250 mL chemo infusion (</= 41m/m2), 45 mg/m2 = 96 mg, Intravenous,  Once, 7 of 7 cycles Administration: 96 mg (05/13/2018), 96 mg (06/10/2018), 96 mg (06/17/2018), 96 mg (05/20/2018), 96 mg (06/24/2018), 96 mg (05/27/2018), 96 mg (06/03/2018)  for chemotherapy treatment.    07/31/2018 - 07/15/2019 Chemotherapy   The patient had durvalumab (IMFINZI) 1,000 mg in sodium chloride 0.9 % 100 mL chemo infusion, 1,020 mg, Intravenous,  Once, 26 of 26 cycles Administration: 1,000 mg  (07/31/2018), 1,000 mg (10/22/2018), 1,000 mg (11/05/2018), 1,000 mg (11/19/2018), 1,000 mg (12/03/2018), 1,000 mg (08/14/2018), 1,000 mg (12/17/2018), 1,000 mg (08/27/2018), 1,000 mg (09/10/2018), 1,000 mg (09/24/2018), 1,000 mg (10/11/2018), 1,000 mg (12/31/2018), 1,000 mg (01/14/2019), 1,120 mg (01/28/2019), 1,120 mg (02/11/2019), 1,120 mg (02/25/2019), 1,120 mg (03/11/2019), 1,120 mg (03/25/2019), 1,120 mg (04/08/2019), 1,120 mg (04/22/2019), 1,120 mg (05/06/2019), 1,120 mg (05/20/2019), 1,120 mg (06/03/2019), 1,120 mg (06/17/2019), 1,120 mg (07/01/2019), 1,120 mg (07/15/2019)  for chemotherapy treatment.    05/11/2020 - 05/13/2020 Chemotherapy   Received 1 cycle of Adriamycin and Cytoxan and discontinued it   11/17/2021 Cancer Staging   Staging form: Lung, AJCC 8th Edition - Clinical: Stage IIIB (cT3, cN2, cM0) - Signed by MCurt Bears MD on 11/17/2021   Malignant neoplasm of upper-outer quadrant of left breast in female, estrogen receptor negative ([place]  02/19/2020 Cancer Staging   Staging form: Breast, AJCC 8th Edition - Clinical stage from 02/19/2020: Stage IIB (cT2, cN0, cM0, G3, ER-, PR-, HER2-) - Signed by CGardenia Phlegm NP on 02/25/2020   02/19/2020 Initial Diagnosis   Chest CT for lung cancer follow-up showed a left breast module. Mammogram and UKoreashowed a 3.7cm mass with calcifications at the 2 o'clock position in the left breast, no left axillary adenopathy. Biopsy showed IDC, grade 3, HER-2 negative (0), ER/PR negative, Ki67 40%.   04/08/2020 Surgery   Left lumpectomy ([name]: three foci of IDC, grade 3, 3.3cm, 1.0cm, 0.7cm, with high grade DCIS, clear margins, 2 left axillary lymph nodes negative for carcinoma.    05/11/2020 - 05/13/2020 Chemotherapy   Received 1 cycle of Adriamycin and Cytoxan and discontinued it   10/18/2021 Relapse/Recurrence   Mammogram detected  indeterminate left breast mass at 12 o'clock position measuring 6 mm, 2 focal areas of calcifications (H 8 mm) within the left breast  favored to be fat necrosis. The 6 mm mass on biopsy came back as grade 3 IDC ER 30% weak, PR 0%, HER2 negative, Ki-67 20%     CURRENT THERAPY: Faslodex, starting 06/30/22  INTERVAL HISTORY: [name] returns for follow up and treatment as scheduled. Last seen by Thedore Mins, NP 12/7. She began faslodex 12/15, tolerated well so far without obvious side effects. She thinks breast mass has "gone down" and intermittent burning pain has improved. Has not heard from surgery. Breathing has improved also on Breztri. On 3-4 L oxygen. Denies any other new/specific complaints.     REVIEW OF SYSTEMS:   All other systems were reviewed with the patient and are negative.  MEDICAL HISTORY:  Past Medical History:  Diagnosis Date   Anemia    as teen   Asthma    Breast cancer ([place])    left breast cancer 02/2020   CHF (congestive heart failure) (HCC)    COPD, mild (HCC)    Depression    Diverticulitis    Family history of anesthesia complication    vomiting   GI bleeding    Heart failure ([place])    New onset 07/25/14   Histoplasmosis    left eye   Hyperkalemia    Hypertension    Lung cancer ([place]) 05/02/2018   s/p chemoradiation, immunotherapy   Obesity (BMI 30-39.9)    Pneumonia    dx wtih pneumonia on 05/27/16- seen by [facility]    PONV (postoperative nausea and vomiting)    Restrictive lung disease    Shortness of breath    with exertion    Sleep apnea    mask and oxygen at nite for sleep at 2L    Tobacco abuse    Umbilical hernia     SURGICAL HISTORY: Past Surgical History:  Procedure Laterality Date   APPLICATION OF WOUND VAC  03/19/2013   Procedure: APPLICATION OF WOUND VAC;  Surgeon: Madilyn Hook, DO;  Location: [facility];  Service: General;;   BREAST BIOPSY Left 09/29/2021   BREAST BIOPSY Left 10/18/2021   BREAST LUMPECTOMY Left 04/08/2020   BREAST LUMPECTOMY WITH RADIOACTIVE SEED AND SENTINEL LYMPH NODE BIOPSY Left 04/08/2020   Procedure: LEFT BREAST LUMPECTOMY WITH  RADIOACTIVE SEED AND LEFT AXILLARY SENTINEL LYMPH NODE BIOPSY;  Surgeon: Alphonsa Overall, MD;  Location: [facility];  Service: General;  Laterality: Left;  PEC BLOCK, NEEDS ANESTHESIA CONSULT   CESAREAN SECTION  04/12/1985   COLONOSCOPY WITH PROPOFOL N/A 06/13/2016   Procedure: COLONOSCOPY WITH PROPOFOL;  Surgeon: Jerene Bears, MD;  Location: [facility];  Service: Gastroenterology;  Laterality: N/A;   ERCP N/A 02/15/2017   Procedure: ENDOSCOPIC RETROGRADE CHOLANGIOPANCREATOGRAPHY (ERCP);  Surgeon: [name], MD;  Location: [facility];  Service: Endoscopy;  Laterality: N/A;   INSERTION OF MESH N/A 03/19/2013   Procedure: INSERTION OF MESH;  Surgeon: Madilyn Hook, DO;  Location: [facility];  Service: General;  Laterality: N/A;   IR IMAGING GUIDED PORT INSERTION  03/26/2020   IR THORACENTESIS ASP PLEURAL SPACE W/IMG GUIDE  05/25/2020   VENTRAL HERNIA REPAIR N/A 03/19/2013   Procedure:  OPEN VENTRAL HERNIA REPAIR WITH MESH AND APPLICATION OF WOUND VAC;  Surgeon: Madilyn Hook, DO;  Location: [facility];  Service: General;  Laterality: N/A;   VIDEO BRONCHOSCOPY Bilateral 04/19/2018   Procedure: VIDEO BRONCHOSCOPY WITH FLUORO;  Surgeon:  Rigoberto Noel, MD;  Location: [facility];  Service: Cardiopulmonary;  Laterality: Bilateral;    I have reviewed the social history and family history with the patient and they are unchanged from previous note.  ALLERGIES:  has No Known Allergies.  MEDICATIONS:  Current Outpatient Medications  Medication Sig Dispense Refill   acetaminophen (TYLENOL) 325 MG tablet Take 650 mg by mouth every 6 (six) hours as needed for headache (pain).     albuterol (VENTOLIN HFA) 108 (90 Base) MCG/ACT inhaler Inhale 2 puffs into the lungs every 6 (six) hours as needed for wheezing or shortness of breath. 8 g 3   aspirin 81 MG chewable tablet Chew 1 tablet (81 mg total) by mouth daily. 30 tablet 1   Budeson-Glycopyrrol-Formoterol (BREZTRI AEROSPHERE) 160-9-4.8 MCG/ACT AERO Inhale 2  puffs into the lungs daily. 5.9 g 5   Cholecalciferol (VITAMIN D3) 1.25 MG (50000 UT) TABS Take 1 tablet by mouth daily.     diltiazem (CARDIZEM CD) 240 MG 24 hr capsule TAKE 1 CAPSULE BY MOUTH EVERY DAY 90 capsule 0   furosemide (LASIX) 20 MG tablet Take 1.5 tablets (30 mg total) by mouth daily. 135 tablet 3   ipratropium-albuterol (DUONEB) 0.5-2.5 (3) MG/3ML SOLN Take 3 mLs by nebulization every 3 (three) hours as needed. 360 mL 11   lidocaine-prilocaine (EMLA) cream APPLY TO AFFECTED AREA ONCE AS DIRECTED 30 g 3   metFORMIN (GLUCOPHAGE-XR) 500 MG 24 hr tablet TAKE 1 TABLET BY MOUTH DAILY WITH BREAKFAST. FOR DIABETES. 90 tablet 0   montelukast (SINGULAIR) 10 MG tablet Take 1 tablet (10 mg total) by mouth at bedtime. For allergies 90 tablet 3   OXYGEN Inhale 4 L/min into the lungs continuous.      potassium chloride (KLOR-CON M) 10 MEQ tablet Take 1 tablet (10 mEq total) by mouth daily. 90 tablet 3   rosuvastatin (CRESTOR) 5 MG tablet TAKE 1 TABLET BY MOUTH DAILY. FOR CHOLESTEROL. 90 tablet 3   Spacer/Aero-Holding Chambers (AEROCHAMBER MV) inhaler Use as instructed 1 each 0   No current facility-administered medications for this visit.    PHYSICAL EXAMINATION: ECOG PERFORMANCE STATUS: 2 - Symptomatic, <50% confined to bed  Vitals:   07/13/22 1208  BP: 121/71  Pulse: (!) 103  Resp: (!) 22  Temp: 98 F (36.7 C)  SpO2: 100%   Filed Weights   07/13/22 1208  Weight: 172 lb 1.6 oz (78.1 kg)    GENERAL:alert, no distress and comfortable SKIN: no rash  EYES: sclera clear LUNGS:  normal breathing effort NEURO: alert & oriented x 3 with fluent speech. In wheelchair Breast exam: superficial left breast mass measures 2 cm, no open wound   LABORATORY DATA:  I have reviewed the data as listed    Latest Ref Rng & Units 05/16/2022    2:03 PM 11/15/2021   10:26 AM 09/16/2020    2:45 PM  CBC  WBC 4.0 - 10.5 K/uL 14.8  12.7  10.4   Hemoglobin 12.0 - 15.0 g/dL 11.8  12.0  12.2    Hematocrit 36.0 - 46.0 % 37.8  39.7  38.9   Platelets 150 - 400 K/uL 335  279  315         Latest Ref Rng & Units 05/16/2022    2:03 PM 11/15/2021   10:26 AM 11/09/2021    3:39 PM  CMP  Glucose 70 - 99 mg/dL 125  120  106   BUN 8 - 23 mg/dL 13  12  11  Creatinine 0.44 - 1.00 mg/dL 1.04  0.59  0.70   Sodium 135 - 145 mmol/L 139  138  140   Potassium 3.5 - 5.1 mmol/L 4.1  3.9  3.8   Chloride 98 - 111 mmol/L 99  97  100   CO2 22 - 32 mmol/L 32  37  34   Calcium 8.9 - 10.3 mg/dL 9.1  9.0  8.7   Total Protein 6.5 - 8.1 g/dL 7.8  7.5    Total Bilirubin 0.3 - 1.2 mg/dL 0.3  0.3    Alkaline Phos 38 - 126 U/L 67  89    AST 15 - 41 U/L 14  11    ALT 0 - 44 U/L 10  9        RADIOGRAPHIC STUDIES: I have personally reviewed the radiological images as listed and agreed with the findings in the report. No results found.   ASSESSMENT & PLAN: 65 yo female   Malignant neoplasm of upper-outer quadrant of left breast in female, estrogen receptor negative ([place])  -Diagnosed 02/2020, s/p lumpectomy and 1 cycle adjuvant chemo (AC), pt declined radiation -Recurrence 10/2021: The 6 mm mass on biopsy came back as grade 3 IDC ER 30% weak, PR 0%, HER2 negative, Ki-67 20%. She began letrozole -Progression 06/2022: stopped letrozole when she began faslodex -Ms. Kue appears stable. She is tolerating faslodex without obvious side effects. She will continue -She is very interested in surgery if she is a candidate, has not heard from Dr. Brantley Stage, I will reach out again  2. stage IIIB (T3, N2, M0) non-small cell lung cancer, squamous cell carcinoma. PD-L1 expression: negative - presented with large left lower lobe lung mass in addition to mediastinal lymphadenopathy diagnosed in October 2019. -s/p chemoRT with weekly carbo/taxol and consolidation immunotherapy (imfinzi) s/p 26 cycles  -On supplemental oxygen -Followed by Dr. Julien Nordmann  -Next CT scan expected 11/2022   PLAN: -Proceed with faslodex  today -F/up and next inj 1/11 as scheduled -Will message surgery   All questions were answered. The patient knows to call the clinic with any problems, questions or concerns. No barriers to learning were detected.     Alla Feeling, NP 07/13/22

## 2022-07-14 ENCOUNTER — Ambulatory Visit: Payer: BC Managed Care – PPO | Admitting: Adult Health

## 2022-07-14 ENCOUNTER — Ambulatory Visit: Payer: BC Managed Care – PPO

## 2022-07-14 ENCOUNTER — Encounter: Payer: Self-pay | Admitting: Nurse Practitioner

## 2022-07-20 NOTE — Progress Notes (Signed)
Patient Care Team: Pleas Koch, NP as PCP - General (Internal Medicine) [name], MD as PCP - Cardiology (Cardiology) [name], MD as Consulting Physician (Cardiology) Erroll Luna, MD as Consulting Physician (General Surgery) Nicholas Lose, MD as Consulting Physician (Hematology and Oncology)  DIAGNOSIS:  Encounter Diagnosis  Name Primary?   Malignant neoplasm of upper-outer quadrant of left breast in female, estrogen receptor negative ([place]) Yes    SUMMARY OF ONCOLOGIC HISTORY: Oncology History  Primary malignant neoplasm of bronchus of left lower lobe ([place])  05/02/2018 Initial Diagnosis   Stage III squamous cell carcinoma of left lung ([place])   05/13/2018 - 06/24/2018 Chemotherapy   The patient had palonosetron (ALOXI) injection 0.25 mg, 0.25 mg, Intravenous,  Once, 7 of 7 cycles Administration: 0.25 mg (05/13/2018), 0.25 mg (06/10/2018), 0.25 mg (06/17/2018), 0.25 mg (05/20/2018), 0.25 mg (06/24/2018), 0.25 mg (05/27/2018), 0.25 mg (06/03/2018) CARBOplatin (PARAPLATIN) 300 mg in sodium chloride 0.9 % 250 mL chemo infusion, 300 mg (100 % of original dose 300 mg), Intravenous,  Once, 7 of 7 cycles Dose modification: 300 mg (original dose 300 mg, Cycle 1) Administration: 300 mg (05/13/2018), 300 mg (06/10/2018), 260 mg (06/17/2018), 300 mg (05/20/2018), 300 mg (06/24/2018), 300 mg (05/27/2018), 300 mg (06/03/2018) PACLitaxel (TAXOL) 96 mg in sodium chloride 0.9 % 250 mL chemo infusion (</= 59m/m2), 45 mg/m2 = 96 mg, Intravenous,  Once, 7 of 7 cycles Administration: 96 mg (05/13/2018), 96 mg (06/10/2018), 96 mg (06/17/2018), 96 mg (05/20/2018), 96 mg (06/24/2018), 96 mg (05/27/2018), 96 mg (06/03/2018)  for chemotherapy treatment.    07/31/2018 - 07/15/2019 Chemotherapy   The patient had durvalumab (IMFINZI) 1,000 mg in sodium chloride 0.9 % 100 mL chemo infusion, 1,020 mg, Intravenous,  Once, 26 of 26 cycles Administration: 1,000 mg (07/31/2018), 1,000 mg (10/22/2018),  1,000 mg (11/05/2018), 1,000 mg (11/19/2018), 1,000 mg (12/03/2018), 1,000 mg (08/14/2018), 1,000 mg (12/17/2018), 1,000 mg (08/27/2018), 1,000 mg (09/10/2018), 1,000 mg (09/24/2018), 1,000 mg (10/11/2018), 1,000 mg (12/31/2018), 1,000 mg (01/14/2019), 1,120 mg (01/28/2019), 1,120 mg (02/11/2019), 1,120 mg (02/25/2019), 1,120 mg (03/11/2019), 1,120 mg (03/25/2019), 1,120 mg (04/08/2019), 1,120 mg (04/22/2019), 1,120 mg (05/06/2019), 1,120 mg (05/20/2019), 1,120 mg (06/03/2019), 1,120 mg (06/17/2019), 1,120 mg (07/01/2019), 1,120 mg (07/15/2019)  for chemotherapy treatment.    05/11/2020 - 05/13/2020 Chemotherapy   Received 1 cycle of Adriamycin and Cytoxan and discontinued it   11/17/2021 Cancer Staging   Staging form: Lung, AJCC 8th Edition - Clinical: Stage IIIB (cT3, cN2, cM0) - Signed by MCurt Bears MD on 11/17/2021   Malignant neoplasm of upper-outer quadrant of left breast in female, estrogen receptor negative ([place]  02/19/2020 Cancer Staging   Staging form: Breast, AJCC 8th Edition - Clinical stage from 02/19/2020: Stage IIB (cT2, cN0, cM0, G3, ER-, PR-, HER2-) - Signed by CGardenia Phlegm NP on 02/25/2020   02/19/2020 Initial Diagnosis   Chest CT for lung cancer follow-up showed a left breast module. Mammogram and UKoreashowed a 3.7cm mass with calcifications at the 2 o'clock position in the left breast, no left axillary adenopathy. Biopsy showed IDC, grade 3, HER-2 negative (0), ER/PR negative, Ki67 40%.   04/08/2020 Surgery   Left lumpectomy ([name]: three foci of IDC, grade 3, 3.3cm, 1.0cm, 0.7cm, with high grade DCIS, clear margins, 2 left axillary lymph nodes negative for carcinoma.    05/11/2020 - 05/13/2020 Chemotherapy   Received 1 cycle of Adriamycin and Cytoxan and discontinued it   10/18/2021 Relapse/Recurrence   Mammogram detected indeterminate left breast mass  at 12 o'clock position measuring 6 mm, 2 focal areas of calcifications (H 8 mm) within the left breast favored to be fat necrosis. The  6 mm mass on biopsy came back as grade 3 IDC ER 30% weak, PR 0%, HER2 negative, Ki-67 20%     CHIEF COMPLIANT: Follow-up breast cancer on Faslodex    INTERVAL HISTORY: Deborah [name] is a 67 y.o. with the above- mentioned breast cancer. She presents to the clinic today for a follow-up on Faslodex injections.  She is tolerating the Faslodex injections fairly well.  She does not have any side effects or symptoms other than the mild discomfort from injections.  She saw Dr. Brantley Stage who recommended surgery and she is planning to do that on 08/16/2022.   ALLERGIES:  has No Known Allergies.  MEDICATIONS:  Current Outpatient Medications  Medication Sig Dispense Refill   acetaminophen (TYLENOL) 325 MG tablet Take 650 mg by mouth every 6 (six) hours as needed for headache (pain).     albuterol (VENTOLIN HFA) 108 (90 Base) MCG/ACT inhaler Inhale 2 puffs into the lungs every 6 (six) hours as needed for wheezing or shortness of breath. 8 g 3   aspirin 81 MG chewable tablet Chew 1 tablet (81 mg total) by mouth daily. 30 tablet 1   Budeson-Glycopyrrol-Formoterol (BREZTRI AEROSPHERE) 160-9-4.8 MCG/ACT AERO Inhale 2 puffs into the lungs daily. 5.9 g 5   Cholecalciferol (VITAMIN D3) 1.25 MG (50000 UT) TABS Take 1 tablet by mouth daily.     diltiazem (CARDIZEM CD) 240 MG 24 hr capsule TAKE 1 CAPSULE BY MOUTH EVERY DAY 90 capsule 0   furosemide (LASIX) 20 MG tablet Take 1.5 tablets (30 mg total) by mouth daily. 135 tablet 3   ipratropium-albuterol (DUONEB) 0.5-2.5 (3) MG/3ML SOLN Take 3 mLs by nebulization every 3 (three) hours as needed. 360 mL 11   lidocaine-prilocaine (EMLA) cream APPLY TO AFFECTED AREA ONCE AS DIRECTED 30 g 3   metFORMIN (GLUCOPHAGE-XR) 500 MG 24 hr tablet TAKE 1 TABLET BY MOUTH DAILY WITH BREAKFAST. FOR DIABETES. 90 tablet 0   montelukast (SINGULAIR) 10 MG tablet Take 1 tablet (10 mg total) by mouth at bedtime. For allergies 90 tablet 3   OXYGEN Inhale 4 L/min into the lungs continuous.       potassium chloride (KLOR-CON M) 10 MEQ tablet Take 1 tablet (10 mEq total) by mouth daily. 90 tablet 3   rosuvastatin (CRESTOR) 5 MG tablet TAKE 1 TABLET BY MOUTH DAILY. FOR CHOLESTEROL. 90 tablet 3   Spacer/Aero-Holding Chambers (AEROCHAMBER MV) inhaler Use as instructed 1 each 0   No current facility-administered medications for this visit.    PHYSICAL EXAMINATION: ECOG PERFORMANCE STATUS: 1 - Symptomatic but completely ambulatory  Vitals:   07/27/22 1125  BP: 110/70  Pulse: (!) 104  Temp: 97.8 F (36.6 C)  SpO2: 100%   Filed Weights   07/27/22 1125  Weight: 174 lb 1.6 oz (79 kg)    BREAST: No palpable masses or nodules in either right or left breasts. No palpable axillary supraclavicular or infraclavicular adenopathy no breast tenderness or nipple discharge. (exam performed in the presence of a chaperone)  LABORATORY DATA:  I have reviewed the data as listed    Latest Ref Rng & Units 05/16/2022    2:03 PM 11/15/2021   10:26 AM 11/09/2021    3:39 PM  CMP  Glucose 70 - 99 mg/dL 125  120  106   BUN 8 - 23 mg/dL 13  12  11   Creatinine 0.44 - 1.00 mg/dL 1.04  0.59  0.70   Sodium 135 - 145 mmol/L 139  138  140   Potassium 3.5 - 5.1 mmol/L 4.1  3.9  3.8   Chloride 98 - 111 mmol/L 99  97  100   CO2 22 - 32 mmol/L 32  37  34   Calcium 8.9 - 10.3 mg/dL 9.1  9.0  8.7   Total Protein 6.5 - 8.1 g/dL 7.8  7.5    Total Bilirubin 0.3 - 1.2 mg/dL 0.3  0.3    Alkaline Phos 38 - 126 U/L 67  89    AST 15 - 41 U/L 14  11    ALT 0 - 44 U/L 10  9      Lab Results  Component Value Date   WBC 14.8 (H) 05/16/2022   HGB 11.8 (L) 05/16/2022   HCT 37.8 05/16/2022   MCV 82.4 05/16/2022   PLT 335 05/16/2022   NEUTROABS 11.9 (H) 05/16/2022    ASSESSMENT & PLAN:  Malignant neoplasm of upper-outer quadrant of left breast in female, estrogen receptor negative ([place]) 04/08/20: Left lumpectomy [name]): three foci of IDC, grade 3, 3.3cm, 1.0cm, 0.7cm, with high grade DCIS, clear margins, 2  left axillary lymph nodes negative for carcinoma. HER-2 negative (0), ER/PR negative, Ki67 40%.     Treatment Plan: 1. Adjuvant chemotherapy with Adriamycin and Cytoxan dose dense 4 followed by Taxol weekly 12 (discontinued after cycle 1 because of profound fatigue and generalized failure to thrive along with hypoxia and tachycardia) the first cycle was even given at a low dose 2. Followed by adjuvant radiation therapy (patient does not want to receive radiation)   Severe COPD: 24-hour oxygen -------------------------------------------------------------------------------------------------------------------------------------------- Hospitalization: GI bleed: 4 units of PRBC given November 2021: Today's hemoglobin is 12.2.   Relapse: Mammogram detected indeterminate left breast mass at 12 o'clock position measuring 6 mm, 2 focal areas of calcifications (H 8 mm) within the left breast favored to be fat necrosis. The 6 mm mass on biopsy came back as grade 3 IDC ER 30% weak, PR 0%, HER2 negative, Ki-67 20%   Treatment plan: 1.    Surgery decided that she is too high risk for surgery and therefore medical management was advised. Patient saw Dr. Brantley Stage last week and they set a date for August 16, 2022 for her surgery.   Current treatment: Faslodex injections Once surgery is done we will discontinue Faslodex injections. We will switch her to anastrozole postoperatively.    Lung cancer: Under the care of Dr. Julien Nordmann.  Return to clinic 2 weeks after surgery to discuss the results of the surgery.  No orders of the defined types were placed in this encounter.  The patient has a good understanding of the overall plan. she agrees with it. she will call with any problems that may develop before the next visit here. Total time spent: 30 mins including face to face time and time spent for planning, charting and co-ordination of care   Harriette Ohara, MD 07/27/22    I Gardiner Coins am  acting as a Education administrator for [employer]  I have reviewed the above documentation for accuracy and completeness, and I agree with the above.

## 2022-07-24 ENCOUNTER — Ambulatory Visit: Payer: Self-pay | Admitting: Surgery

## 2022-07-24 DIAGNOSIS — C50912 Malignant neoplasm of unspecified site of left female breast: Secondary | ICD-10-CM | POA: Diagnosis not present

## 2022-07-25 ENCOUNTER — Ambulatory Visit: Payer: BC Managed Care – PPO | Attending: Medical | Admitting: Medical

## 2022-07-25 ENCOUNTER — Encounter: Payer: Self-pay | Admitting: Medical

## 2022-07-25 VITALS — BP 104/64 | HR 103 | Ht 63.0 in | Wt 172.4 lb

## 2022-07-25 DIAGNOSIS — J449 Chronic obstructive pulmonary disease, unspecified: Secondary | ICD-10-CM | POA: Diagnosis not present

## 2022-07-25 DIAGNOSIS — I5032 Chronic diastolic (congestive) heart failure: Secondary | ICD-10-CM

## 2022-07-25 DIAGNOSIS — C50412 Malignant neoplasm of upper-outer quadrant of left female breast: Secondary | ICD-10-CM

## 2022-07-25 DIAGNOSIS — J9611 Chronic respiratory failure with hypoxia: Secondary | ICD-10-CM

## 2022-07-25 DIAGNOSIS — Z171 Estrogen receptor negative status [ER-]: Secondary | ICD-10-CM

## 2022-07-25 NOTE — Patient Instructions (Signed)
Medication Instructions:  Your Physician recommend you continue on your current medication as directed.    *If you need a refill on your cardiac medications before your next appointment, please call your pharmacy*   Lab Work: None ordered today   Testing/Procedures: None ordered today   Follow-Up: At [facility], you and your health needs are our priority.  As part of our continuing mission to provide you with exceptional heart care, we have created designated Provider Care Teams.  These Care Teams include your primary Cardiologist (physician) and Advanced Practice Providers (APPs -  Physician Assistants and Nurse Practitioners) who all work together to provide you with the care you need, when you need it.  We recommend signing up for the patient portal called "MyChart".  Sign up information is provided on this After Visit Summary.  MyChart is used to connect with patients for Virtual Visits (Telemedicine).  Patients are able to view lab/test results, encounter notes, upcoming appointments, etc.  Non-urgent messages can be sent to your provider as well.   To learn more about what you can do with MyChart, go to [email or web address].    Your next appointment:   6 month(s)  The format for your next appointment:   In Person  Provider:   You may see Kathlyn Sacramento, MD or one of the following Advanced Practice Providers on your designated Care Team:  Murray Hodgkins, NP Christell Faith, PA-C Cadence Kathlen Mody, PA-C Gerrie Nordmann, NP

## 2022-07-25 NOTE — Progress Notes (Signed)
Cardiology Office Note:    Date:  07/25/2022   ID:  Deborah [name], DOB August 30, 1955, MRN [id number]  PCP:  Pleas Koch, NP  [facility] Cardiologist:  Kathlyn Sacramento, MD  [facility] Electrophysiologist:  None   Referring MD: Pleas Koch, NP   Chief Complaint: 6 month follow-up  History of Present Illness:    Deborah [name] is a 67 y.o. female with a hx of chronic diastolic heart failure, chronic right heel heart failure due to COPD, previous tobacco use with COPD, hypertension, obesity, bipolar disorder, and here today for follow-up.   She was admitted 07/2014 for CHF.  She had a moderate left pleural effusion.  Echo showed EF 50 to 55%, G1 DD, dilated with RV with hypokinesis and mild pulmonary hypertension.  VQ scan showed low probability for pulmonary embolism.  She subsequently quit smoking.     She was diagnosed with squamous cell carcinoma of the lung, stage IIIb 07/2017.  She had a large left lower lobe mass in addition to mediastinal lymphadenopathy.  She was treated with radiation, chemotherapy, and immunotherapy.   Subsequent echo showed EF 55 to 60%, NR WMA, indeterminate diastolic filling due to EA fusion, RVSP 35.0 mmHg.   She was seen 04/2021 with a high HR and Zio was recommended. Heart monitor showed predominately normal rhthym, average HR 90s, with 1st degree AV block, and Second degree AV block, brief SVT, rate PVCs.  Last seen 10/2021 and was doing well from a cardiac perspective.  She reported recurrent breast cancer.  Today, the patient reports she is overall doing well from a cardiac perspective. She is starting a new round of breast cancer treatment. They are planning surgery, resection of the cancer. A date has not been set yet. She denies chest pain or SOB. She is on her baseline O2. No lower leg edema, orthopnea or pnd. She keeps her feet elevated. EKG shows ST, which is normal for the patient.   Past Medical History:  Diagnosis Date    Anemia    as teen   Asthma    Breast cancer ([place])    left breast cancer 02/2020   CHF (congestive heart failure) (HCC)    COPD, mild (HCC)    Depression    Diverticulitis    Family history of anesthesia complication    vomiting   GI bleeding    Heart failure ([place])    New onset 07/25/14   Histoplasmosis    left eye   Hyperkalemia    Hypertension    Lung cancer ([place]) 05/02/2018   s/p chemoradiation, immunotherapy   Obesity (BMI 30-39.9)    Pneumonia    dx wtih pneumonia on 05/27/16- seen by [facility]    PONV (postoperative nausea and vomiting)    Restrictive lung disease    Shortness of breath    with exertion    Sleep apnea    mask and oxygen at nite for sleep at 2L    Tobacco abuse    Umbilical hernia     Past Surgical History:  Procedure Laterality Date   APPLICATION OF WOUND VAC  03/19/2013   Procedure: APPLICATION OF WOUND VAC;  Surgeon: Madilyn Hook, DO;  Location: [facility];  Service: General;;   BREAST BIOPSY Left 09/29/2021   BREAST BIOPSY Left 10/18/2021   BREAST LUMPECTOMY Left 04/08/2020   BREAST LUMPECTOMY WITH RADIOACTIVE SEED AND SENTINEL LYMPH NODE BIOPSY Left 04/08/2020   Procedure: LEFT BREAST LUMPECTOMY WITH RADIOACTIVE SEED AND  LEFT AXILLARY SENTINEL LYMPH NODE BIOPSY;  Surgeon: Alphonsa Overall, MD;  Location: [place];  Service: General;  Laterality: Left;  PEC BLOCK, NEEDS ANESTHESIA CONSULT   CESAREAN SECTION  04/12/1985   COLONOSCOPY WITH PROPOFOL N/A 06/13/2016   Procedure: COLONOSCOPY WITH PROPOFOL;  Surgeon: Jerene Bears, MD;  [facility]: [facility];  Service: Gastroenterology;  Laterality: N/A;   ERCP N/A 02/15/2017   Procedure: ENDOSCOPIC RETROGRADE CHOLANGIOPANCREATOGRAPHY (ERCP);  Surgeon: [name], MD;  Location: [facility];  Service: Endoscopy;  Laterality: N/A;   INSERTION OF MESH N/A 03/19/2013   Procedure: INSERTION OF MESH;  Surgeon: Madilyn Hook, DO;  [facility];  Service: General;  Laterality: N/A;   IR IMAGING GUIDED PORT  INSERTION  03/26/2020   IR THORACENTESIS ASP PLEURAL SPACE W/IMG GUIDE  05/25/2020   VENTRAL HERNIA REPAIR N/A 03/19/2013   Procedure:  OPEN VENTRAL HERNIA REPAIR WITH MESH AND APPLICATION OF WOUND VAC;  Surgeon: Madilyn Hook, DO;  [facility]: [facility];  Service: General;  Laterality: N/A;   VIDEO BRONCHOSCOPY Bilateral 04/19/2018   Procedure: VIDEO BRONCHOSCOPY WITH FLUORO;  Surgeon: Rigoberto Noel, MD;  [facility];  Service: Cardiopulmonary;  Laterality: Bilateral;    Current Medications: Current Meds  Medication Sig   acetaminophen (TYLENOL) 325 MG tablet Take 650 mg by mouth every 6 (six) hours as needed for headache (pain).   albuterol (VENTOLIN HFA) 108 (90 Base) MCG/ACT inhaler Inhale 2 puffs into the lungs every 6 (six) hours as needed for wheezing or shortness of breath.   aspirin 81 MG chewable tablet Chew 1 tablet (81 mg total) by mouth daily.   Budeson-Glycopyrrol-Formoterol (BREZTRI AEROSPHERE) 160-9-4.8 MCG/ACT AERO Inhale 2 puffs into the lungs daily.   Cholecalciferol (VITAMIN D3) 1.25 MG (50000 UT) TABS Take 1 tablet by mouth daily.   diltiazem (CARDIZEM CD) 240 MG 24 hr capsule TAKE 1 CAPSULE BY MOUTH EVERY DAY   furosemide (LASIX) 20 MG tablet Take 1.5 tablets (30 mg total) by mouth daily.   ipratropium-albuterol (DUONEB) 0.5-2.5 (3) MG/3ML SOLN Take 3 mLs by nebulization every 3 (three) hours as needed.   lidocaine-prilocaine (EMLA) cream APPLY TO AFFECTED AREA ONCE AS DIRECTED   metFORMIN (GLUCOPHAGE-XR) 500 MG 24 hr tablet TAKE 1 TABLET BY MOUTH DAILY WITH BREAKFAST. FOR DIABETES.   montelukast (SINGULAIR) 10 MG tablet Take 1 tablet (10 mg total) by mouth at bedtime. For allergies   OXYGEN Inhale 4 L/min into the lungs continuous.    potassium chloride (KLOR-CON M) 10 MEQ tablet Take 1 tablet (10 mEq total) by mouth daily.   rosuvastatin (CRESTOR) 5 MG tablet TAKE 1 TABLET BY MOUTH DAILY. FOR CHOLESTEROL.   Spacer/Aero-Holding Chambers (AEROCHAMBER MV) inhaler  Use as instructed     Allergies:   Patient has no known allergies.   Social History   Socioeconomic History   Marital status: Married    Spouse name: Not on file   Number of children: 1   Years of education: Not on file   Highest education level: Not on file  Occupational History   Occupation: Retired    Fish farm manager: NOT EMPLOYED  Tobacco Use   Smoking status: Former    Packs/day: 3.00    Years: 43.00    Total pack years: 129.00    Types: Cigarettes    Quit date: 12/17/2012    Years since quitting: 9.6   Smokeless tobacco: Never  Vaping Use   Vaping Use: Former  Substance and Sexual Activity   Alcohol  use: No    Alcohol/week: 0.0 standard drinks of alcohol   Drug use: No   Sexual activity: Not on file  Other Topics Concern   Not on file  Social History Narrative   ** Merged History Encounter **       ** Data from: 07/25/14 Enc Dept: [facility]       ** Data from: 06/05/13 Enc Dept: [facility]   Married, one son 25 yo.            Social Determinants of Health   Financial Resource Strain: Not on file  Food Insecurity: Not on file  Transportation Needs: Unmet Transportation Needs (05/02/2018)   PRAPARE - Hydrologist (Medical): Yes    Lack of Transportation (Non-Medical): No  Physical Activity: Not on file  Stress: Not on file  Social Connections: Not on file     Family History: The patient's family history includes Bipolar disorder in her father and sister; Congestive Heart Failure in her father; Emphysema in her mother; Heart attack in her maternal uncle, maternal uncle, and mother; Lymphoma in her maternal uncle; Melanoma in her sister; Other in her sister; Pancreatic cancer in her maternal aunt; Schizophrenia in her sister. There is no history of Colon cancer or Breast cancer.  ROS:   Please see the history of present illness.     All other systems reviewed and are negative.  EKGs/Labs/Other Studies Reviewed:     The following studies were reviewed today:  Heart monitor 10/2021 Patient had a min HR of 53 bpm, max HR of 197 bpm, and avg HR of 91 bpm.  Predominant underlying rhythm was Sinus Rhythm. First Degree AV Block was present.  5 Supraventricular Tachycardia runs occurred, the run with the fastest interval lasting 8 beats with a max rate of 197 bpm, the longest lasting 16 beats with an avg rate of 121 bpm. Second Degree AV Block-Mobitz I (Wenckebach) was present. Isolated Rare PVCs.   Echo 05/25/20  1. Left ventricular ejection fraction, by estimation, is 55 to 60%. The  left ventricle has normal function. The left ventricle has no regional  wall motion abnormalities. Indeterminate diastolic filling due to E-A  fusion.   2. Right ventricular systolic function is normal. The right ventricular  size is normal. There is normal pulmonary artery systolic pressure. The  estimated right ventricular systolic pressure is 93.7 mmHg.   3. The mitral valve is normal in structure. No evidence of mitral valve  regurgitation. No evidence of mitral stenosis.   4. The aortic valve is normal in structure. Aortic valve regurgitation is  not visualized. No aortic stenosis is present.   5. The inferior vena cava is normal in size with greater than 50%  respiratory variability, suggesting right atrial pressure of 3 mmHg.  Comparison(s): No significant change from prior study. Prior images  reviewed side by side.     EKG:  EKG is ordered today.  The ekg ordered today demonstrates ST, 103bpm, low voltage, nonspecific T wave changes  Recent Labs: 05/16/2022: ALT 10; BUN 13; Creatinine 1.04; Hemoglobin 11.8; Platelet Count 335; Potassium 4.1; Sodium 139  Recent Lipid Panel    Component Value Date/Time   CHOL 139 02/02/2022 1541   TRIG 187.0 (H) 02/02/2022 1541   HDL 42.60 02/02/2022 1541   CHOLHDL 3 02/02/2022 1541   VLDL 37.4 02/02/2022 1541   LDLCALC 59 02/02/2022 1541   LDLDIRECT 148.0 12/08/2020 1444     Physical Exam:  VS:  BP 104/64 (BP Location: Right Arm, Patient Position: Sitting, Cuff Size: Large)   Pulse (!) 103   Ht 5\' 3"  (1.6 m)   Wt 172 lb 6 oz (78.2 kg)   SpO2 98%   BMI 30.53 kg/m     Wt Readings from Last 3 Encounters:  07/25/22 172 lb 6 oz (78.2 kg)  07/13/22 172 lb 1.6 oz (78.1 kg)  06/22/22 173 lb (78.5 kg)     GEN:  Well nourished, well developed in no acute distress HEENT: Normal NECK: No JVD; No carotid bruits LYMPHATICS: No lymphadenopathy CARDIAC: RR, tachycardia, no murmurs, rubs, gallops RESPIRATORY:  wheezing on exam ABDOMEN: Soft, non-tender, non-distended MUSCULOSKELETAL:  No edema; No deformity  SKIN: Warm and dry NEUROLOGIC:  Alert and oriented x 3 PSYCHIATRIC:  Normal affect   ASSESSMENT:    1. Chronic diastolic heart failure ([place])   2. Malignant neoplasm of upper-outer quadrant of left breast in female, estrogen receptor negative ([place])   3. Chronic obstructive pulmonary disease, unspecified COPD type ([place])   4. Chronic respiratory failure with hypoxia (HCC)    PLAN:    In order of problems listed above:  Chronic diastolic Heart Failure Echo from 2021 showed LVEF 55-60%, no WMA, normal RVSF. The patient is euvolemic on exam today. She is taking lasix 30mg  daily.   Recurrent Left Breast Cancer Patient is being treated with chemotherapy. They are planning on resective surgery, no date has been set. The patient is stable from a cardiac Perspective without further cardiac work-up. OK to hold ASA perioperatively per surgeons instructions. Patient is not very active at baseline, METS>4. She has chronic sinus tachycardia on diltiazem. According to the RCRI she is Class 2 risk, at 6% 30 day risk of death, MI or cardiac arrest.   Chronic respiratory failure COPD on 2-4L O2 She is on baseline O2. Wheezing on exam.  Disposition: Follow up in 6 month(s) with MD/APP      Signed, Freda Jaquith Ninfa Meeker, PA-C  07/25/2022 12:28 PM    [facility]

## 2022-07-27 ENCOUNTER — Inpatient Hospital Stay: Payer: BC Managed Care – PPO | Admitting: Hematology and Oncology

## 2022-07-27 ENCOUNTER — Inpatient Hospital Stay: Payer: BC Managed Care – PPO | Attending: Internal Medicine

## 2022-07-27 VITALS — BP 110/70 | HR 104 | Temp 97.8°F | Wt 174.1 lb

## 2022-07-27 DIAGNOSIS — C50412 Malignant neoplasm of upper-outer quadrant of left female breast: Secondary | ICD-10-CM | POA: Diagnosis not present

## 2022-07-27 DIAGNOSIS — Z95828 Presence of other vascular implants and grafts: Secondary | ICD-10-CM

## 2022-07-27 DIAGNOSIS — Z171 Estrogen receptor negative status [ER-]: Secondary | ICD-10-CM

## 2022-07-27 DIAGNOSIS — Z79899 Other long term (current) drug therapy: Secondary | ICD-10-CM | POA: Diagnosis not present

## 2022-07-27 DIAGNOSIS — Z5111 Encounter for antineoplastic chemotherapy: Secondary | ICD-10-CM | POA: Diagnosis not present

## 2022-07-27 MED ORDER — FULVESTRANT 250 MG/5ML IM SOSY
500.0000 mg | PREFILLED_SYRINGE | Freq: Once | INTRAMUSCULAR | Status: AC
  Administered 2022-07-27: 500 mg via INTRAMUSCULAR
  Filled 2022-07-27: qty 10

## 2022-07-27 NOTE — Assessment & Plan Note (Signed)
04/08/20: Left lumpectomy [name]): three foci of IDC, grade 3, 3.3cm, 1.0cm, 0.7cm, with high grade DCIS, clear margins, 2 left axillary lymph nodes negative for carcinoma. HER-2 negative (0), ER/PR negative, Ki67 40%.     Treatment Plan: 1. Adjuvant chemotherapy with Adriamycin and Cytoxan dose dense 4 followed by Taxol weekly 12 (discontinued after cycle 1 because of profound fatigue and generalized failure to thrive along with hypoxia and tachycardia) the first cycle was even given at a low dose 2. Followed by adjuvant radiation therapy (patient does not want to receive radiation)   Severe COPD: 24-hour oxygen -------------------------------------------------------------------------------------------------------------------------------------------- Hospitalization: GI bleed: 4 units of PRBC given November 2021: Today's hemoglobin is 12.2.   Relapse: Mammogram detected indeterminate left breast mass at 12 o'clock position measuring 6 mm, 2 focal areas of calcifications (H 8 mm) within the left breast favored to be fat necrosis. The 6 mm mass on biopsy came back as grade 3 IDC ER 30% weak, PR 0%, HER2 negative, Ki-67 20%   Treatment plan: 1.    Surgery decided that she is too high risk for surgery and therefore medical management was advised.   Current treatment: Letrozole Letrozole toxicities: None Surveillance plan: Perform mammogram and ultrasound in 3 months.  If she is responding to treatment we will continue with the same plan.  If she is not responding to treatment we will consider surgical option at that time. Lung cancer: Under the care of Dr. Julien Nordmann.

## 2022-07-27 NOTE — Patient Instructions (Signed)

## 2022-07-28 ENCOUNTER — Ambulatory Visit: Payer: BC Managed Care – PPO

## 2022-07-28 ENCOUNTER — Ambulatory Visit: Payer: BC Managed Care – PPO | Admitting: Adult Health

## 2022-07-30 ENCOUNTER — Other Ambulatory Visit: Payer: Self-pay | Admitting: Primary Care

## 2022-07-30 DIAGNOSIS — E1165 Type 2 diabetes mellitus with hyperglycemia: Secondary | ICD-10-CM

## 2022-07-31 ENCOUNTER — Other Ambulatory Visit: Payer: Self-pay | Admitting: Cardiovascular Disease

## 2022-07-31 ENCOUNTER — Encounter: Payer: Self-pay | Admitting: Hematology and Oncology

## 2022-08-07 NOTE — Pre-Procedure Instructions (Signed)
Surgical Instructions    Your procedure is scheduled on Wednesday, August 16, 2022.  Report to [facility] Main Entrance "A" at 12:30 P.M., then check in with the Admitting office.  Call this number if you have problems the morning of surgery:  [phone]   If you have any questions prior to your surgery date call [phone]: Open Monday-Friday 8am-4pm If you experience any cold or flu symptoms such as cough, fever, chills, shortness of breath, etc. between now and your scheduled surgery, please notify us at the above number     Remember:  Do not eat after midnight the night before your surgery  You may drink clear liquids until 11:30 am the morning of your surgery.   Clear liquids allowed are: Water, Non-Citrus Juices (without pulp), Carbonated Beverages, Clear Tea, Black Coffee ONLY (NO MILK, CREAM OR POWDERED CREAMER of any kind), and Gatorade    Take these medicines the morning of surgery with A SIP OF WATER:   diltiazem (CARDIZEM CD)   rosuvastatin (CRESTOR)   Budeson-Glycopyrrol-Formoterol (BREZTRI AEROSPHERE) INHALER  albuterol (VENTOLIN HFA) 108 (90 Base) -if needed (please bring this inhaler with you day of surgery)  acetaminophen (TYLENOL) -if needed  Follow your surgeon's instructions on when to stop Aspirin.  If no instructions were given by your surgeon then you will need to call the office to get those instructions.    As of today, STOP taking any Aleve, Naproxen, Ibuprofen, Motrin, Advil, Goody's, BC's, all herbal medications, fish oil, and all vitamins.     WHAT DO I DO ABOUT MY DIABETES MEDICATION?   Do not take oral diabetes medicines Metformin (pills) the morning of surgery.   HOW TO MANAGE YOUR DIABETES BEFORE AND AFTER SURGERY  Why is it important to control my blood sugar before and after surgery? Improving blood sugar levels before and after surgery helps healing and can limit problems. A way of improving blood sugar control is eating a healthy diet  by:  Eating less sugar and carbohydrates  Increasing activity/exercise  Talking with your doctor about reaching your blood sugar goals High blood sugars (greater than 180 mg/dL) can raise your risk of infections and slow your recovery, so you will need to focus on controlling your diabetes during the weeks before surgery. Make sure that the doctor who takes care of your diabetes knows about your planned surgery including the date and location.  How do I manage my blood sugar before surgery? Check your blood sugar at least 4 times a day, starting 2 days before surgery, to make sure that the level is not too high or low.  Check your blood sugar the morning of your surgery when you wake up and every 2 hours until you get to the Short Stay unit.  If your blood sugar is less than 70 mg/dL, you will need to treat for low blood sugar: Do not take insulin. Treat a low blood sugar (less than 70 mg/dL) with  cup of clear juice (cranberry or apple), 4 glucose tablets, OR glucose gel. Recheck blood sugar in 15 minutes after treatment (to make sure it is greater than 70 mg/dL). If your blood sugar is not greater than 70 mg/dL on recheck, call [phone] for further instructions. Report your blood sugar to the short stay nurse when you get to Short Stay.  If you are admitted to the hospital after surgery: Your blood sugar will be checked by the staff and you will probably be given insulin after surgery (instead  of oral diabetes medicines) to make sure you have good blood sugar levels. The goal for blood sugar control after surgery is 80-180 mg/dL.       Do not wear jewelry or makeup. Do not wear lotions, powders, perfumes or deodorant. Do not shave 48 hours prior to surgery.  Do not bring valuables to the hospital. Do not wear nail polish, gel polish, artificial nails, or any other type of covering on natural nails (fingers and toes) If you have artificial nails or gel coating that need to be removed  by a nail salon, please have this removed prior to surgery. Artificial nails or gel coating may interfere with anesthesia's ability to adequately monitor your vital signs.  [facility] is not responsible for any belongings or valuables.    Do NOT Smoke (Tobacco/Vaping)  24 hours prior to your procedure  If you use a CPAP at night, you may bring your mask for your overnight stay.   Contacts, glasses, hearing aids, dentures or partials may not be worn into surgery, please bring cases for these belongings   For patients admitted to the hospital, discharge time will be determined by your treatment team.   Patients discharged the day of surgery will not be allowed to drive home, and someone needs to stay with them for 24 hours.   SURGICAL WAITING ROOM VISITATION Patients having surgery or a procedure may have no more than 2 support people in the waiting area - these visitors may rotate.   Children under the age of 71 must have an adult with them who is not the patient. If the patient needs to stay at the hospital during part of their recovery, the visitor guidelines for inpatient rooms apply. Pre-op nurse will coordinate an appropriate time for 1 support person to accompany patient in pre-op.  This support person may not rotate.   Please refer to [email or web address] for the visitor guidelines for Inpatients (after your surgery is over and you are in a regular room).    Special instructions:    Oral Hygiene is also important to reduce your risk of infection.  Remember - BRUSH YOUR TEETH THE MORNING OF SURGERY WITH YOUR REGULAR TOOTHPASTE   [place]- Preparing For Surgery  Before surgery, you can play an important role. Because skin is not sterile, your skin needs to be as free of germs as possible. You can reduce the number of germs on your skin by washing with CHG (chlorahexidine gluconate) Soap before surgery.  CHG is an antiseptic  cleaner which kills germs and bonds with the skin to continue killing germs even after washing.     Please do not use if you have an allergy to CHG or antibacterial soaps. If your skin becomes reddened/irritated stop using the CHG.  Do not shave (including legs and underarms) for at least 48 hours prior to first CHG shower. It is OK to shave your face.  Please follow these instructions carefully.     Shower the NIGHT BEFORE SURGERY and the MORNING OF SURGERY with CHG Soap.   If you chose to wash your hair, wash your hair first as usual with your normal shampoo. After you shampoo, rinse your hair and body thoroughly to remove the shampoo.  Then [employer] and genitals (private parts) with your normal soap and rinse thoroughly to remove soap.  After that Use CHG Soap as you would any other liquid soap. You can apply CHG directly to the skin and wash gently with a  scrungie or a clean washcloth.   Apply the CHG Soap to your body ONLY FROM THE NECK DOWN.  Do not use on open wounds or open sores. Avoid contact with your eyes, ears, mouth and genitals (private parts). Wash Face and genitals (private parts)  with your normal soap.   Wash thoroughly, paying special attention to the area where your surgery will be performed.  Thoroughly rinse your body with warm water from the neck down.  DO NOT shower/wash with your normal soap after using and rinsing off the CHG Soap.  Pat yourself dry with a CLEAN TOWEL.  Wear CLEAN PAJAMAS to bed the night before surgery  Place CLEAN SHEETS on your bed the night before your surgery  DO NOT SLEEP WITH PETS.   Day of Surgery:  Take a shower with CHG soap. Wear Clean/Comfortable clothing the morning of surgery Do not apply any deodorants/lotions.   Remember to brush your teeth WITH YOUR REGULAR TOOTHPASTE.    If you received a COVID test during your pre-op visit, it is requested that you wear a mask when out in public, stay away from anyone that may not  be feeling well, and notify your surgeon if you develop symptoms. If you have been in contact with anyone that has tested positive in the last 10 days, please notify your surgeon.    Please read over the following fact sheets that you were given.

## 2022-08-08 ENCOUNTER — Encounter (HOSPITAL_COMMUNITY): Payer: Self-pay

## 2022-08-08 ENCOUNTER — Other Ambulatory Visit: Payer: Self-pay

## 2022-08-08 ENCOUNTER — Encounter (HOSPITAL_COMMUNITY)
Admission: RE | Admit: 2022-08-08 | Discharge: 2022-08-08 | Disposition: A | Discharge status: Home / Self Care | Payer: BC Managed Care – PPO | Source: Ambulatory Visit | Attending: Surgery | Admitting: Surgery

## 2022-08-08 VITALS — BP 118/76 | HR 120 | Temp 97.5°F | Resp 17 | Ht 63.0 in | Wt 173.0 lb

## 2022-08-08 DIAGNOSIS — Z01812 Encounter for preprocedural laboratory examination: Secondary | ICD-10-CM | POA: Insufficient documentation

## 2022-08-08 DIAGNOSIS — R7303 Prediabetes: Secondary | ICD-10-CM | POA: Insufficient documentation

## 2022-08-08 DIAGNOSIS — Z01818 Encounter for other preprocedural examination: Secondary | ICD-10-CM

## 2022-08-08 HISTORY — DX: Type 2 diabetes mellitus without complications: E11.9

## 2022-08-08 LAB — BASIC METABOLIC PANEL
Anion gap: 9 (ref 5–15)
BUN: 13 mg/dL (ref 8–23)
CO2: 29 mmol/L (ref 22–32)
Calcium: 9.2 mg/dL (ref 8.9–10.3)
Chloride: 99 mmol/L (ref 98–111)
Creatinine, Ser: 0.62 mg/dL (ref 0.44–1.00)
GFR, Estimated: >60 mL/min (ref >60)
Glucose, Bld: 103 mg/dL — ABNORMAL HIGH (ref 70–99)
Potassium: 4 mmol/L (ref 3.5–5.1)
Sodium: 137 mmol/L (ref 135–145)

## 2022-08-08 LAB — CBC
HCT: 38 % (ref 36.0–46.0)
Hemoglobin: 11.7 g/dL — ABNORMAL LOW (ref 12.0–15.0)
MCH: 25.8 pg — ABNORMAL LOW (ref 26.0–34.0)
MCHC: 30.8 g/dL (ref 30.0–36.0)
MCV: 83.7 fL (ref 80.0–100.0)
Platelets: 328 10*3/uL (ref 150–400)
RBC: 4.54 MIL/uL (ref 3.87–5.11)
RDW: 15.5 % (ref 11.5–15.5)
WBC: 11.7 10*3/uL — ABNORMAL HIGH (ref 4.0–10.5)
nRBC: 0 % (ref 0.0–0.2)

## 2022-08-08 LAB — GLUCOSE, CAPILLARY: Glucose-Capillary: 117 mg/dL — ABNORMAL HIGH (ref 70–99)

## 2022-08-08 LAB — HEMOGLOBIN A1C
Hgb A1c MFr Bld: 5.3 % (ref 4.8–5.6)
Mean Plasma Glucose: 105.41 mg/dL

## 2022-08-08 NOTE — Progress Notes (Addendum)
PCP - Doreene Nest, NP Cardiologist - Lorine Bears, MD Pulmonologist - Huns[place], Lesia Sago, MD   PPM/ICD - denies Device Orders - n/a Rep Notified - n/a  Chest x-ray - n/a EKG - 07/25/22 Stress Test - denies ECHO - 05/25/20 Cardiac Cath - denies  Sleep Study - positive for OSA CPAP - no, patient is wearing oxygen 4L/min continuous  Fasting Blood Sugar - patient is not checking CBG at home CBG today - 117 A1C - done in PAT on 08/08/22  Last dose of GLP1 agonist-  n/a  Blood Thinner Instructions: n/a Aspirin Instructions - last dose - patient was instructed to call MD and ask if she must hold Aspirin prior to surgery. Patient verbalized understanding.  Patient was instructed: As of today, STOP taking any Aleve, Naproxen, Ibuprofen, Motrin, Advil, Goody's, BC's, all herbal medications, fish oil, and all vitamins.   ERAS Protcol - yes, until 11:30 o'clock  COVID TEST- n/a   Anesthesia review: yes - per MD order; Pulse elevated 115 - 120. Patient states that her pulse is elevated most of the time. Antionette Poles, PA-C saw the patient in PAT.   Patient denies shortness of breath, fever, cough and chest pain at PAT appointment   All instructions explained to the patient, with a verbal understanding of the material. Patient agrees to go over the instructions while at home for a better understanding. Patient also instructed to self quarantine after being tested for COVID-19. The opportunity to ask questions was provided.

## 2022-08-09 ENCOUNTER — Encounter: Payer: Self-pay | Admitting: Cardiovascular Disease

## 2022-08-09 NOTE — Progress Notes (Signed)
Anesthesia Chart Review:  Follows with cardiology for history of HFpEF, chronic sinus tachycardia, chronic right sided heart failure due to COPD, HTN.  Echo 05/25/2020 showed EF 55 to 60%, normal RV function, normal pulmonary artery systolic pressure, normal valves.  Last seen by [place], PA-C 07/25/2022 for preop evaluation.  Per note, "Recurrent Left Breast Cancer Patient is being treated with chemotherapy. They are planning on resective surgery, no date has been set. The patient is stable from a cardiac Perspective without further cardiac work-up. OK to hold ASA perioperatively per surgeons instructions. Patient is not very active at baseline, METS>4. She has chronic sinus tachycardia on diltiazem. According to the RCRI she is Class 2 risk, at 6% 30 day risk of death, MI or cardiac arrest."  History of squamous cell carcinoma of the lung, stage IIIb 07/2017.  She had a large left lower lobe mass in addition to mediastinal lymphadenopathy.  She was treated with radiation, chemotherapy, and immunotherapy.  She continues to follow with pulmonology for severe COPD and chronic hypoxic respiratory failure on 4 L supplemental O2.  She uses Breztri 2 puffs daily and DuoNebs as needed.  She was last seen by Dr. Silas Flood on 06/21/2022.  Upcoming surgery discussed.  Per note, "reoperative evaluation: Pulmonary medicine does not provide surgical clearance but rather a preoperative risk assessment.  Based on the ARISCAT model, assuming duration of surgery is 3 hours or less, patient is low risk 1.3% risk of postoperative pulmonary complication.  If duration of surgery is greater than 3 hours, patient is intermediate at 13.3% risk of postoperative pulmonary complication.  There is no modifiable risk factors to address prior to surgery."  Spoke with patient at PAT visit to assess her respiratory status. She states she is currently at baseline, no increased shortness of breath or DOE.  She reports she has chronic mild  sinus tachycardia and heart rate generally is around 100-110.  On exam she is in no acute distress, she is wearing nasal cannula connected to 4 L of oxygen.  Auscultation of the lungs reveals mild diffuse inspiratory next-door wheezing.  I encouraged her to continue her daily inhaled medications as prescribed and also do a DuoNeb treatment prior to coming to the hospital on day of surgery.  Labs reviewed, unremarkable.  Non-insulin-dependent DM2 well-controlled, A1c 5.3.  EKG 07/25/2022: Sinus tachycardia.  Rate 103.  Low-voltage QRS.  Nonspecific T wave abnormality.  CT chest 05/19/2022: IMPRESSION: 1. Left perihilar post treatment consolidation and small left pleural effusion, stable. No evidence of metastatic disease. 2. Bronchiectasis, peribronchovascular nodularity and subpleural consolidation in the right lower lobe, possibly due to previous or recurrent aspiration. 3. Liver may be mildly steatotic. 4. Aortic atherosclerosis (ICD10-I70.0). Coronary artery calcification. 5.  Emphysema (ICD10-J43.9).  Event monitor 11/03/2021: Patient had a min HR of 53 bpm, max HR of 197 bpm, and avg HR of 91 bpm.  Predominant underlying rhythm was Sinus Rhythm. First Degree AV Block was present.  5 Supraventricular Tachycardia runs occurred, the run with the fastest interval lasting 8 beats with a max rate of 197 bpm, the longest lasting 16 beats with an avg rate of 121 bpm. Second Degree AV Block-Mobitz I (Wenckebach) was present. Isolated Rare PVCs.  TTE 05/25/2020:  1. Left ventricular ejection fraction, by estimation, is 55 to 60%. The  left ventricle has normal function. The left ventricle has no regional  wall motion abnormalities. Indeterminate diastolic filling due to E-A  fusion.   2. Right ventricular systolic function  is normal. The right ventricular  size is normal. There is normal pulmonary artery systolic pressure. The  estimated right ventricular systolic pressure is 55.3 mmHg.   3.  The mitral valve is normal in structure. No evidence of mitral valve  regurgitation. No evidence of mitral stenosis.   4. The aortic valve is normal in structure. Aortic valve regurgitation is  not visualized. No aortic stenosis is present.   5. The inferior vena cava is normal in size with greater than 50%  respiratory variability, suggesting right atrial pressure of 3 mmHg.   Comparison(s): No significant change from prior study. Prior images  reviewed side by side.     Wynonia Musty [facility]/Anesthesiology Phone [phone] 08/09/2022 2:35 PM

## 2022-08-09 NOTE — Anesthesia Preprocedure Evaluation (Addendum)
Anesthesia Evaluation  Patient identified by MRN, date of birth, ID band Patient awake    Reviewed: Allergy & Precautions, H&P , NPO status , Patient's Chart, lab work & pertinent test results  History of Anesthesia Complications (+) PONV and history of anesthetic complications  Airway Mallampati: II  TM Distance: >3 FB Neck ROM: Full    Dental no notable dental hx.    Pulmonary shortness of breath, asthma , sleep apnea , COPD,  oxygen dependent, former smoker   Pulmonary exam normal breath sounds clear to auscultation       Cardiovascular hypertension, Pt. on medications +CHF  Normal cardiovascular exam Rhythm:Regular Rate:Normal     Neuro/Psych    Depression    negative neurological ROS  negative psych ROS   GI/Hepatic negative GI ROS, Neg liver ROS,,,  Endo/Other  negative endocrine ROSdiabetes, Type 2    Renal/GU negative Renal ROS  negative genitourinary   Musculoskeletal negative musculoskeletal ROS (+)    Abdominal  (+) + obese  Peds negative pediatric ROS (+)  Hematology  (+) Blood dyscrasia, anemia   Anesthesia Other Findings   Reproductive/Obstetrics negative OB ROS                             Anesthesia Physical Anesthesia Plan  ASA: 3  Anesthesia Plan: General   Post-op Pain Management: Dilaudid IV   Induction: Intravenous  PONV Risk Score and Plan: 4 or greater and Ondansetron, Dexamethasone, Midazolam, Droperidol and Treatment may vary due to age or medical condition  Airway Management Planned: LMA  Additional Equipment:   Intra-op Plan:   Post-operative Plan: Extubation in OR  Informed Consent: I have reviewed the patients History and Physical, chart, labs and discussed the procedure including the risks, benefits and alternatives for the proposed anesthesia with the patient or authorized representative who has indicated his/her understanding and acceptance.      Dental advisory given  Plan Discussed with: CRNA  Anesthesia Plan Comments: (PAT note by Karoline Caldwell, PA-C: Follows with cardiology for history of HFpEF, chronic sinus tachycardia, chronic right sided heart failure due to COPD, HTN.  Echo 05/25/2020 showed EF 55 to 60%, normal RV function, normal pulmonary artery systolic pressure, normal valves.  Last seen by [place], PA-C 07/25/2022 for preop evaluation.  Per note, "Recurrent Left Breast Cancer Patient is being treated with chemotherapy. They are planning on resective surgery, no date has been set. The patient is stable from a cardiac Perspective without further cardiac work-up. OK to hold ASA perioperatively per surgeons instructions.Patient is not very active at baseline, METS>4. She has chronic sinus tachycardia on diltiazem. According to the RCRI she isClass 2 risk, at 6% 30 day risk of death, MI or cardiac arrest."  History of squamous cell carcinoma of the lung, stage IIIb 07/2017. She had a large left lower lobe mass in addition to mediastinal lymphadenopathy. She was treated with radiation, chemotherapy, and immunotherapy.  She continues to follow with pulmonology for severe COPD and chronic hypoxic respiratory failure on 4 L supplemental O2.  She uses Breztri 2 puffs daily and DuoNebs as needed.  She was last seen by Dr. Silas Flood on 06/21/2022.  Upcoming surgery discussed.  Per note, "reoperative evaluation: Pulmonary medicinedoes not provide surgical clearance but rather a preoperative risk assessment. Based on theARISCAT model, assuming duration of surgery is 3 hours or less, patient is low risk 1.3% risk of postoperative pulmonary complication. If duration of surgery  is greater than 3 hours, patient is intermediate at 13.3% risk of postoperative pulmonary complication. There is no modifiable risk factors to address prior to surgery."  Spoke with patient at PAT visit to assess her respiratory status. She states she is  currently at baseline, no increased shortness of breath or DOE.  She reports she has chronic mild sinus tachycardia and heart rate generally is around 100-110.  On exam she is in no acute distress, she is wearing nasal cannula connected to 4 L of oxygen.  Auscultation of the lungs reveals mild diffuse inspiratory next-door wheezing.  I encouraged her to continue her daily inhaled medications as prescribed and also do a DuoNeb treatment prior to coming to the hospital on day of surgery.  Labs reviewed, unremarkable.  Non-insulin-dependent DM2 well-controlled, A1c 5.3.  EKG 07/25/2022: Sinus tachycardia.  Rate 103.  Low-voltage QRS.  Nonspecific T wave abnormality.  CT chest 05/19/2022: IMPRESSION: 1. Left perihilar post treatment consolidation and small left pleural effusion, stable. No evidence of metastatic disease. 2. Bronchiectasis, peribronchovascular nodularity and subpleural consolidation in the right lower lobe, possibly due to previous or recurrent aspiration. 3. Liver may be mildly steatotic. 4. Aortic atherosclerosis (ICD10-I70.0). Coronary artery calcification. 5. Emphysema (ICD10-J43.9).  Event monitor 11/03/2021: Patient had a min HR of 53 bpm, max HR of 197 bpm, and avg HR of 91 bpm.  Predominant underlying rhythm was Sinus Rhythm. First Degree AV Block was present.  5 Supraventricular Tachycardia runs occurred, the run with the fastest interval lasting 8 beats with a max rate of 197 bpm, the longest lasting 16 beats with an avg rate of 121 bpm. Second Degree AV Block-Mobitz I (Wenckebach) was present. Isolated Rare PVCs.  TTE 05/25/2020: 1. Left ventricular ejection fraction, by estimation, is 55 to 60%. The  left ventricle has normal function. The left ventricle has no regional  wall motion abnormalities. Indeterminate diastolic filling due to E-A  fusion.  2. Right ventricular systolic function is normal. The right ventricular  size is normal. There is normal pulmonary  artery systolic pressure. The  estimated right ventricular systolic pressure is 74.1 mmHg.  3. The mitral valve is normal in structure. No evidence of mitral valve  regurgitation. No evidence of mitral stenosis.  4. The aortic valve is normal in structure. Aortic valve regurgitation is  not visualized. No aortic stenosis is present.  5. The inferior vena cava is normal in size with greater than 50%  respiratory variability, suggesting right atrial pressure of 3 mmHg.   Comparison(s): No significant change from prior study. Prior images  reviewed side by side.    )        Anesthesia Quick Evaluation

## 2022-08-14 ENCOUNTER — Inpatient Hospital Stay: Payer: BC Managed Care – PPO

## 2022-08-16 ENCOUNTER — Ambulatory Visit (HOSPITAL_COMMUNITY): Payer: BC Managed Care – PPO | Admitting: Physician Assistant

## 2022-08-16 ENCOUNTER — Other Ambulatory Visit: Payer: Self-pay

## 2022-08-16 ENCOUNTER — Encounter (HOSPITAL_COMMUNITY): Payer: Self-pay | Admitting: Surgery

## 2022-08-16 ENCOUNTER — Encounter (HOSPITAL_COMMUNITY)
Admission: RE | Disposition: A | Discharge status: Home / Self Care | Payer: Self-pay | Source: Ambulatory Visit | Attending: Surgery

## 2022-08-16 ENCOUNTER — Ambulatory Visit (HOSPITAL_COMMUNITY)
Admission: RE | Admit: 2022-08-16 | Discharge: 2022-08-16 | Disposition: A | Discharge status: Home / Self Care | Payer: BC Managed Care – PPO | Source: Ambulatory Visit | Attending: Surgery | Admitting: Surgery

## 2022-08-16 DIAGNOSIS — E119 Type 2 diabetes mellitus without complications: Secondary | ICD-10-CM | POA: Diagnosis not present

## 2022-08-16 DIAGNOSIS — G4730 Sleep apnea, unspecified: Secondary | ICD-10-CM | POA: Diagnosis not present

## 2022-08-16 DIAGNOSIS — Z85118 Personal history of other malignant neoplasm of bronchus and lung: Secondary | ICD-10-CM | POA: Diagnosis not present

## 2022-08-16 DIAGNOSIS — Z9221 Personal history of antineoplastic chemotherapy: Secondary | ICD-10-CM | POA: Diagnosis not present

## 2022-08-16 DIAGNOSIS — Z7984 Long term (current) use of oral hypoglycemic drugs: Secondary | ICD-10-CM | POA: Insufficient documentation

## 2022-08-16 DIAGNOSIS — Z923 Personal history of irradiation: Secondary | ICD-10-CM | POA: Diagnosis not present

## 2022-08-16 DIAGNOSIS — I5032 Chronic diastolic (congestive) heart failure: Secondary | ICD-10-CM | POA: Insufficient documentation

## 2022-08-16 DIAGNOSIS — D630 Anemia in neoplastic disease: Secondary | ICD-10-CM | POA: Diagnosis not present

## 2022-08-16 DIAGNOSIS — I509 Heart failure, unspecified: Secondary | ICD-10-CM | POA: Diagnosis not present

## 2022-08-16 DIAGNOSIS — J449 Chronic obstructive pulmonary disease, unspecified: Secondary | ICD-10-CM | POA: Diagnosis not present

## 2022-08-16 DIAGNOSIS — C50412 Malignant neoplasm of upper-outer quadrant of left female breast: Secondary | ICD-10-CM | POA: Insufficient documentation

## 2022-08-16 DIAGNOSIS — J9611 Chronic respiratory failure with hypoxia: Secondary | ICD-10-CM | POA: Diagnosis not present

## 2022-08-16 DIAGNOSIS — Z9981 Dependence on supplemental oxygen: Secondary | ICD-10-CM | POA: Diagnosis not present

## 2022-08-16 DIAGNOSIS — Z79899 Other long term (current) drug therapy: Secondary | ICD-10-CM | POA: Insufficient documentation

## 2022-08-16 DIAGNOSIS — Z993 Dependence on wheelchair: Secondary | ICD-10-CM | POA: Diagnosis not present

## 2022-08-16 DIAGNOSIS — C50912 Malignant neoplasm of unspecified site of left female breast: Secondary | ICD-10-CM | POA: Diagnosis not present

## 2022-08-16 DIAGNOSIS — I110 Hypertensive heart disease with heart failure: Secondary | ICD-10-CM | POA: Diagnosis not present

## 2022-08-16 DIAGNOSIS — Z170 Estrogen receptor positive status [ER+]: Secondary | ICD-10-CM | POA: Diagnosis not present

## 2022-08-16 DIAGNOSIS — Z87891 Personal history of nicotine dependence: Secondary | ICD-10-CM | POA: Diagnosis not present

## 2022-08-16 HISTORY — PX: BREAST LUMPECTOMY: SHX2

## 2022-08-16 LAB — GLUCOSE, CAPILLARY
Glucose-Capillary: 118 mg/dL — ABNORMAL HIGH (ref 70–99)
Glucose-Capillary: 103 mg/dL — ABNORMAL HIGH (ref 70–99)

## 2022-08-16 SURGERY — BREAST LUMPECTOMY
Anesthesia: General | Site: Breast | Laterality: Left

## 2022-08-16 MED ORDER — FENTANYL CITRATE (PF) 250 MCG/5ML IJ SOLN
INTRAMUSCULAR | Status: DC | PRN
  Administered 2022-08-16: 50 ug via INTRAVENOUS

## 2022-08-16 MED ORDER — CEFAZOLIN SODIUM-DEXTROSE 2-4 GM/100ML-% IV SOLN
INTRAVENOUS | Status: AC
  Filled 2022-08-16: qty 100

## 2022-08-16 MED ORDER — TRAMADOL HCL 50 MG PO TABS: 50.0000 mg | ORAL_TABLET | Freq: Four times a day (QID) | ORAL | 0 refills | Status: DC | PRN

## 2022-08-16 MED ORDER — 0.9 % SODIUM CHLORIDE (POUR BTL) OPTIME
TOPICAL | Status: DC | PRN
  Administered 2022-08-16: 1000 mL

## 2022-08-16 MED ORDER — DEXAMETHASONE SODIUM PHOSPHATE 10 MG/ML IJ SOLN
INTRAMUSCULAR | Status: DC | PRN
  Administered 2022-08-16: 5 mg via INTRAVENOUS

## 2022-08-16 MED ORDER — MIDAZOLAM HCL 2 MG/2ML IJ SOLN
INTRAMUSCULAR | Status: AC
  Filled 2022-08-16: qty 2

## 2022-08-16 MED ORDER — LACTATED RINGERS IV SOLN: INTRAVENOUS | Status: DC

## 2022-08-16 MED ORDER — OXYCODONE HCL 5 MG PO TABS: 5.0000 mg | ORAL_TABLET | Freq: Once | ORAL | Status: DC | PRN

## 2022-08-16 MED ORDER — ONDANSETRON HCL 4 MG/2ML IJ SOLN
INTRAMUSCULAR | Status: DC | PRN
  Administered 2022-08-16: 4 mg via INTRAVENOUS

## 2022-08-16 MED ORDER — FENTANYL CITRATE (PF) 250 MCG/5ML IJ SOLN
INTRAMUSCULAR | Status: AC
  Filled 2022-08-16: qty 5

## 2022-08-16 MED ORDER — PROPOFOL 10 MG/ML IV BOLUS
INTRAVENOUS | Status: DC | PRN
  Administered 2022-08-16: 20 mg via INTRAVENOUS
  Administered 2022-08-16: 50 mg via INTRAVENOUS

## 2022-08-16 MED ORDER — PROPOFOL 10 MG/ML IV BOLUS
INTRAVENOUS | Status: AC
  Filled 2022-08-16: qty 20

## 2022-08-16 MED ORDER — INSULIN ASPART 100 UNIT/ML IJ SOLN: 0.0000 [IU] | INTRAMUSCULAR | Status: DC | PRN

## 2022-08-16 MED ORDER — ORAL CARE MOUTH RINSE: 15.0000 mL | Freq: Once | OROMUCOSAL | Status: AC

## 2022-08-16 MED ORDER — ACETAMINOPHEN 500 MG PO TABS
ORAL_TABLET | ORAL | Status: AC
  Filled 2022-08-16: qty 2

## 2022-08-16 MED ORDER — PHENYLEPHRINE 80 MCG/ML (10ML) SYRINGE FOR IV PUSH (FOR BLOOD PRESSURE SUPPORT)
PREFILLED_SYRINGE | INTRAVENOUS | Status: DC | PRN
  Administered 2022-08-16: 80 ug via INTRAVENOUS

## 2022-08-16 MED ORDER — HYDROMORPHONE HCL 1 MG/ML IJ SOLN: 0.2500 mg | INTRAMUSCULAR | Status: DC | PRN

## 2022-08-16 MED ORDER — CHLORHEXIDINE GLUCONATE 0.12 % MT SOLN
OROMUCOSAL | Status: AC
  Administered 2022-08-16: 15 mL via OROMUCOSAL
  Filled 2022-08-16: qty 15

## 2022-08-16 MED ORDER — OXYCODONE HCL 5 MG/5ML PO SOLN: 5.0000 mg | Freq: Once | ORAL | Status: DC | PRN

## 2022-08-16 MED ORDER — BUPIVACAINE-EPINEPHRINE 0.25% -1:200000 IJ SOLN
INTRAMUSCULAR | Status: DC | PRN
  Administered 2022-08-16: 30 mL

## 2022-08-16 MED ORDER — PROMETHAZINE HCL 25 MG/ML IJ SOLN: 6.2500 mg | INTRAMUSCULAR | Status: DC | PRN

## 2022-08-16 MED ORDER — AMISULPRIDE (ANTIEMETIC) 5 MG/2ML IV SOLN: 10.0000 mg | Freq: Once | INTRAVENOUS | Status: DC | PRN

## 2022-08-16 MED ORDER — BUPIVACAINE HCL (PF) 0.25 % IJ SOLN
INTRAMUSCULAR | Status: AC
  Filled 2022-08-16: qty 30

## 2022-08-16 MED ORDER — CEFAZOLIN SODIUM-DEXTROSE 2-4 GM/100ML-% IV SOLN
2.0000 g | INTRAVENOUS | Status: AC
  Administered 2022-08-16: 2 g via INTRAVENOUS

## 2022-08-16 MED ORDER — CHLORHEXIDINE GLUCONATE CLOTH 2 % EX PADS: 6.0000 | MEDICATED_PAD | Freq: Once | CUTANEOUS | Status: DC

## 2022-08-16 MED ORDER — MEPERIDINE HCL 25 MG/ML IJ SOLN: 6.2500 mg | INTRAMUSCULAR | Status: DC | PRN

## 2022-08-16 MED ORDER — LIDOCAINE 2% (20 MG/ML) 5 ML SYRINGE
INTRAMUSCULAR | Status: AC
  Filled 2022-08-16: qty 5

## 2022-08-16 MED ORDER — ACETAMINOPHEN 500 MG PO TABS: 1000.0000 mg | ORAL_TABLET | ORAL | Status: DC

## 2022-08-16 MED ORDER — LIDOCAINE 2% (20 MG/ML) 5 ML SYRINGE
INTRAMUSCULAR | Status: DC | PRN
  Administered 2022-08-16: 80 mg via INTRAVENOUS

## 2022-08-16 MED ORDER — LACTATED RINGERS IV SOLN: INTRAVENOUS | Status: DC | PRN

## 2022-08-16 MED ORDER — CHLORHEXIDINE GLUCONATE 0.12 % MT SOLN: 15.0000 mL | Freq: Once | OROMUCOSAL | Status: AC

## 2022-08-16 SURGICAL SUPPLY — 30 items
APPLIER CLIP 9.375 MED OPEN (MISCELLANEOUS) ×1
BAG COUNTER SPONGE SURGICOUNT (BAG) ×1 IMPLANT
BINDER BREAST XLRG (GAUZE/BANDAGES/DRESSINGS) IMPLANT
CANISTER SUCT 3000ML PPV (MISCELLANEOUS) IMPLANT
CHLORAPREP W/TINT 26 (MISCELLANEOUS) ×1 IMPLANT
CLIP APPLIE 9.375 MED OPEN (MISCELLANEOUS) IMPLANT
COVER PROBE W GEL 5X96 (DRAPES) ×1 IMPLANT
COVER SURGICAL LIGHT HANDLE (MISCELLANEOUS) ×1 IMPLANT
DERMABOND ADVANCED .7 DNX12 (GAUZE/BANDAGES/DRESSINGS) ×1 IMPLANT
DEVICE DUBIN SPECIMEN MAMMOGRA (MISCELLANEOUS) ×1 IMPLANT
DRAPE CHEST BREAST 15X10 FENES (DRAPES) ×1 IMPLANT
ELECT CAUTERY BLADE 6.4 (BLADE) ×1 IMPLANT
ELECT REM PT RETURN 9FT ADLT (ELECTROSURGICAL) ×1
ELECTRODE REM PT RTRN 9FT ADLT (ELECTROSURGICAL) ×1 IMPLANT
GLOVE BIO SURGEON STRL SZ8 (GLOVE) ×1 IMPLANT
GLOVE BIOGEL PI IND STRL 8 (GLOVE) ×1 IMPLANT
GOWN STRL REUS W/ TWL LRG LVL3 (GOWN DISPOSABLE) ×1 IMPLANT
GOWN STRL REUS W/ TWL XL LVL3 (GOWN DISPOSABLE) ×1 IMPLANT
GOWN STRL REUS W/TWL LRG LVL3 (GOWN DISPOSABLE) ×1
GOWN STRL REUS W/TWL XL LVL3 (GOWN DISPOSABLE) ×1
KIT BASIN OR (CUSTOM PROCEDURE TRAY) ×1 IMPLANT
KIT MARKER MARGIN INK (KITS) IMPLANT
NDL HYPO 25GX1X1/2 BEV (NEEDLE) IMPLANT
NEEDLE HYPO 25GX1X1/2 BEV (NEEDLE) ×1 IMPLANT
NS IRRIG 1000ML POUR BTL (IV SOLUTION) IMPLANT
PACK GENERAL/GYN (CUSTOM PROCEDURE TRAY) ×1 IMPLANT
SUT MNCRL AB 4-0 PS2 18 (SUTURE) ×1 IMPLANT
SUT SILK 2 0 SH (SUTURE) IMPLANT
SUT VIC AB 3-0 SH 8-18 (SUTURE) ×1 IMPLANT
SYR CONTROL 10ML LL (SYRINGE) IMPLANT

## 2022-08-16 NOTE — Interval H&P Note (Signed)
History and Physical Interval Note:  08/16/2022 1:11 PM  Deborah [name]  has presented today for surgery, with the diagnosis of LEFT BREAST CANCER.  The various methods of treatment have been discussed with the patient and family. After consideration of risks, benefits and other options for treatment, the patient has consented to  Procedure(s): LEFT BREAST LUMPECTOMY (Left) as a surgical intervention.  The patient's history has been reviewed, patient examined, no change in status, stable for surgery.  I have reviewed the patient's chart and labs.  Questions were answered to the patient's satisfaction.     Turner Daniels MD

## 2022-08-16 NOTE — H&P (Signed)
History of Present Illness: Deborah [name] is a 67 y.o. female who is seen today for follow-up of recurrent left breast cancer. She has lung cancer and severe COPD. She actually had her medications changed and is breathing and doing much better. She still wheelchair dependent and quite short of breath at rest but overall seems to be improving. The tumor in her left breast which is quite superficial at about 4:00 is now 1.5 cm from 0.6 cm. She was maintained on letrozole but her medication has been changed to faslodex.    Review of Systems: A complete review of systems was obtained from the patient. I have reviewed this information and discussed as appropriate with the patient. See HPI as well for other ROS.    Medical History: Past Medical History:  Diagnosis Date  Anemia  Asthma, unspecified asthma severity, unspecified whether complicated, unspecified whether persistent  CHF (congestive heart failure) (CMS-HCC)  COPD (chronic obstructive pulmonary disease) (CMS-HCC)  History of cancer  Sleep apnea   There is no problem list on file for this patient.  Past Surgical History:  Procedure Laterality Date  MASTECTOMY    No Known Allergies  Current Outpatient Medications on File Prior to Visit  Medication Sig Dispense Refill  acetaminophen (TYLENOL) 325 MG tablet Take by mouth  albuterol 90 mcg/actuation inhaler Inhale into the lungs  BREZTRI AEROSPHERE 160-9-4.8 mcg/actuation inhaler Inhale into the lungs  dilTIAZem (CARDIZEM CD) 240 MG CD capsule Take 240 mg by mouth once daily  FUROsemide (LASIX) 40 MG tablet Take 20 mg by mouth once daily  KLOR-CON M20 20 mEq ER tablet TAKE 1/2 TABLET BY MOUTH EVERY DAY. OFFICE VISIT REQUIRED FOR FURTHER REFILLS.  lidocaine-prilocaine (EMLA) cream APPLY TO AFFECTED AREA ONCE AS DIRECTED  metFORMIN (GLUCOPHAGE-XR) 500 MG XR tablet TAKE 1 TABLET BY MOUTH DAILY WITH BREAKFAST. FOR DIABETES.  montelukast (SINGULAIR) 10 mg tablet Take by mouth   OXYGEN-AIR DELIVERY SYSTEMS MISC Inhale into the lungs  rosuvastatin (CRESTOR) 5 MG tablet TAKE 1 TABLET (5 MG TOTAL) BY MOUTH DAILY. FOR CHOLESTEROL.   No current facility-administered medications on file prior to visit.   Family History  Problem Relation Age of Onset  Stroke Sister    Social History   Tobacco Use  Smoking Status Former  Types: Cigarettes  Smokeless Tobacco Never    Social History   Socioeconomic History  Marital status: Married  Tobacco Use  Smoking status: Former  Types: Cigarettes  Smokeless tobacco: Never  Vaping Use  Vaping Use: Never used  Substance and Sexual Activity  Alcohol use: Never  Drug use: Never   Objective:   Vitals:  07/24/22 1343  PainSc: 0-No pain   There is no height or weight on file to calculate BMI.   Left breast: Nodule now is almost 2 cm in maximal diameter is quite superficial involving the overlying skin. Location 4:00. No left axillary adenopathy.  Pulmonary: She is on oxygen and minimally short of breath. She is not tachypneic today. Mild wheezing noted. Good air movement.  Labs, Imaging and Diagnostic Testing:  LINICAL DATA: Evaluate known breast cancer.  EXAM: DIGITAL DIAGNOSTIC UNILATERAL LEFT MAMMOGRAM WITH TOMOSYNTHESIS; ULTRASOUND LEFT BREAST LIMITED  TECHNIQUE: Left digital diagnostic mammography and breast tomosynthesis was performed.; Targeted ultrasound examination of the left breast was performed.  COMPARISON: Previous exam(s).  ACR Breast Density Category b: There are scattered areas of fibroglandular density.  FINDINGS: The known breast cancer, containing a coil shaped clip, is visibly larger on today's mammogram.  No other suspicious mammographic findings.  On physical exam, there are mild skin changes overlying the known breast cancer. No ulceration.  Targeted ultrasound is performed, showing a superficial mass containing a biopsy clip consistent with the patient's known  breast cancer. The mass now measures 15 x 10 x 15 mm today versus 6 x 3 x 5 mm previously. The mass is now extending into the overlying skin. No axillary adenopathy.  IMPRESSION: 1. The patient's known breast cancer is larger in the interval and appears to be involving the overlying skin. No axillary adenopathy.  RECOMMENDATION: Recommend follow-up with the patient's oncologist and surgeon for further planning.  I have discussed the findings and recommendations with the patient. If applicable, a reminder letter will be sent to the patient regarding the next appointment.  BI-RADS CATEGORY 6: Known biopsy-proven malignancy.   Electronically Signed By: Dorise Bullion III M.D. On: 06/02/2022 14:33  Assessment and Plan:   Diagnoses and all orders for this visit:  Recurrent breast cancer, left (CMS-HCC)    Overall she is improved and the tumor is growing despite medical management. It is superficial therefore a simple lumpectomy could be performed for local recurrence control. Risk and benefits of surgery reviewed. Will ask anesthesia to see her preop but may be can do her under local and MAC. It is quite superficial therefore seed will not be necessary. She could have worsening of her pulmonary function doing this but I think she is as good as she is going to get today with her history of lung cancer and breast cancer. We discussed this at great length and they would like to proceed. Risk of bleeding, infection, cosmetic deformity, recurrence, worsening underlying medical problem and the need for treatment standard procedures reviewed.  No follow-ups on file.  Kennieth Francois, MD

## 2022-08-16 NOTE — Op Note (Signed)
Preoperative diagnosis: Left breast cancer upper outer quadrant recurrent  Postoperative diagnosis: Same  Procedure: Left breast lumpectomy  Surgeon: Erroll Luna, MD  Anesthesia: LMA with 0.25% Marcaine plain  EBL: Minimal  Specimen: Left breast skin and previous left breast lumpectomy scar with mass with negative margins  Assisting: Puja Mazcis PA  Indications for procedure: The patient is a 67 year old female with multiple medical problems as her current left breast cancer.  She has been managed medically with antiestrogen therapy with this is grown.  It is very superficial and we reviewed repeat lumpectomy.  She does have multiple comorbidities we felt that a lumpectomy would be the best way to control local recurrence and local disease.  Risks and benefits of surgery reviewed.  Risk of bleeding, infection, worsening medical problems, rarely death, recurrence, cosmetic deformity, wound complications and need for the treatments and the procedure reviewed with the patient.  She agreed to proceed.  Description of procedure: The patient was met in the holding area and questions were answered.  Left breast was marked as correct site and the mass was easily visualized in the left upper outer quadrant.  She was taken back to the operating room.  She was placed supine upon the operating table.  After induction of LMA anesthesia, left breast was prepped and draped in sterile fashion timeout performed.  The mass was easily palpable in the skin at the previous lumpectomy site.  Local anesthetic was infiltrated around this.  Curvilinear incisions were made above and below the tumor.  This was excised down to the subcutaneous breast tissue down to the chest wall.  Gross margins were negative.  This was oriented with ink and sent to pathology.  Wound was irrigated.  Hemostasis achieved with cautery.  Local anesthetic was used for the cavity and then the deep layer of the cavity is closed with 3-0 Vicryl.   4 Monocryl was used to close the skin in a subcuticular fashion.  Dermabond applied.  All counts found to be correct.  The patient was awoke extubated taken recovery in satisfactory condition.

## 2022-08-16 NOTE — Anesthesia Procedure Notes (Signed)
Procedure Name: LMA Insertion Date/Time: 08/16/2022 3:49 PM  Performed by: Timoteo Expose, CRNAPre-anesthesia Checklist: Patient identified, Emergency Drugs available, Suction available and Patient being monitored Patient Re-evaluated:Patient Re-evaluated prior to induction Oxygen Delivery Method: Circle system utilized Preoxygenation: Pre-oxygenation with 100% oxygen Induction Type: IV induction Ventilation: Mask ventilation without difficulty LMA: LMA inserted LMA Size: 4.0 Tube type: Oral Number of attempts: 1 Airway Equipment and Method: Stylet and Oral airway Placement Confirmation: ETT inserted through vocal cords under direct vision, positive ETCO2 and breath sounds checked- equal and bilateral Tube secured with: Tape Dental Injury: Teeth and Oropharynx as per pre-operative assessment

## 2022-08-16 NOTE — Anesthesia Postprocedure Evaluation (Signed)
Anesthesia Post Note  Patient: Deborah [name]  Procedure(s) Performed: LEFT BREAST LUMPECTOMY (Left: Breast)     Patient location during evaluation: PACU Anesthesia Type: General Level of consciousness: awake and alert Pain management: pain level controlled Vital Signs Assessment: post-procedure vital signs reviewed and stable Respiratory status: spontaneous breathing, nonlabored ventilation and respiratory function stable Cardiovascular status: blood pressure returned to baseline and stable Postop Assessment: no apparent nausea or vomiting Anesthetic complications: no   No notable events documented.  Last Vitals:  Vitals:   08/16/22 1645 08/16/22 1700  BP: 109/67 (!) 110/54  Pulse: 70 70  Resp: 13 19  Temp:    SpO2: 98% 100%    Last Pain:  Vitals:   08/16/22 1645  TempSrc:   PainSc: 0-No pain                 Lynda Rainwater

## 2022-08-16 NOTE — Transfer of Care (Signed)
Immediate Anesthesia Transfer of Care Note  Patient: [name]  Procedure(s) Performed: LEFT BREAST LUMPECTOMY (Left: Breast)  Patient Location: PACU  Anesthesia Type:General  Level of Consciousness: awake and alert   Airway & Oxygen Therapy: Patient Spontanous Breathing, Patient connected to nasal cannula oxygen, and Patient connected to face mask oxygen  Post-op Assessment: Report given to RN and Post -op Vital signs reviewed and stable  Post vital signs: Reviewed and stable  Last Vitals:  Vitals Value Taken Time  BP 114/71 08/16/22 1623  Temp    Pulse 82 08/16/22 1625  Resp 21 08/16/22 1625  SpO2 99 % 08/16/22 1625  Vitals shown include unvalidated device data.  Last Pain:  Vitals:   08/16/22 1252  TempSrc:   PainSc: 0-No pain         Complications: No notable events documented.

## 2022-08-16 NOTE — Discharge Instructions (Signed)
[facility] °Office Phone Number [phone] ° °BREAST BIOPSY/ PARTIAL MASTECTOMY: POST OP INSTRUCTIONS ° °Always review your discharge instruction sheet given to you by the facility where your surgery was performed. ° °IF YOU HAVE DISABILITY OR FAMILY LEAVE FORMS, YOU MUST BRING THEM TO THE OFFICE FOR PROCESSING.  DO NOT GIVE THEM TO YOUR DOCTOR. ° °A prescription for pain medication may be given to you upon discharge.  Take your pain medication as prescribed, if needed.  If narcotic pain medicine is not needed, then you may take acetaminophen (Tylenol) or ibuprofen (Advil) as needed. °Take your usually prescribed medications unless otherwise directed °If you need a refill on your pain medication, please contact your pharmacy.  They will contact our office to request authorization.  Prescriptions will not be filled after 5pm or on week-ends. °You should eat very light the first 24 hours after surgery, such as soup, crackers, pudding, etc.  Resume your normal diet the day after surgery. °Most patients will experience some swelling and bruising in the breast.  Ice packs and a good support bra will help.  Swelling and bruising can take several days to resolve.  °It is common to experience some constipation if taking pain medication after surgery.  Increasing fluid intake and taking a stool softener will usually help or prevent this problem from occurring.  A mild laxative (Milk of Magnesia or Miralax) should be taken according to package directions if there are no bowel movements after 48 hours. °Unless discharge instructions indicate otherwise, you may remove your bandages 24-48 hours after surgery, and you may shower at that time.  You may have steri-strips (small skin tapes) in place directly over the incision.  These strips should be left on the skin for 7-10 days.  If your surgeon used skin glue on the incision, you may shower in 24 hours.  The glue will flake off over the next 2-3 weeks.  Any  sutures or staples will be removed at the office during your follow-up visit. °ACTIVITIES:  You may resume regular daily activities (gradually increasing) beginning the next day.  Wearing a good support bra or sports bra minimizes pain and swelling.  You may have sexual intercourse when it is comfortable. °You may drive when you no longer are taking prescription pain medication, you can comfortably wear a seatbelt, and you can safely maneuver your car and apply brakes. °RETURN TO WORK:  ______________________________________________________________________________________ °You should see your doctor in the office for a follow-up appointment approximately two weeks after your surgery.  Your doctor’s nurse will typically make your follow-up appointment when she calls you with your pathology report.  Expect your pathology report 2-3 business days after your surgery.  You may call to check if you do not hear from us after three days. °OTHER INSTRUCTIONS: _______________________________________________________________________________________________ _____________________________________________________________________________________________________________________________________ °_____________________________________________________________________________________________________________________________________ °_____________________________________________________________________________________________________________________________________ ° °WHEN TO CALL YOUR DOCTOR: °Fever over 101.0 °Nausea and/or vomiting. °Extreme swelling or bruising. °Continued bleeding from incision. °Increased pain, redness, or drainage from the incision. ° °The clinic staff is available to answer your questions during regular business hours.  Please don’t hesitate to call and ask to speak to one of the nurses for clinical concerns.  If you have a medical emergency, go to the nearest emergency room or call 911.  A surgeon from [facility] is always on call at the hospital. ° °For further questions, please visit [email or web address]  ° °

## 2022-08-17 ENCOUNTER — Encounter (HOSPITAL_COMMUNITY): Payer: Self-pay | Admitting: Surgery

## 2022-08-22 LAB — SURGICAL PATHOLOGY

## 2022-08-28 ENCOUNTER — Encounter: Payer: Self-pay | Admitting: Surgery

## 2022-08-29 NOTE — Assessment & Plan Note (Signed)
04/08/20: Left lumpectomy [name]): three foci of IDC, grade 3, 3.3cm, 1.0cm, 0.7cm, with high grade DCIS, clear margins, 2 left axillary lymph nodes negative for carcinoma. HER-2 negative (0), ER/PR negative, Ki67 40%.     Treatment Plan: 1. Adjuvant chemotherapy with Adriamycin and Cytoxan dose dense 4 followed by Taxol weekly 12 (discontinued after cycle 1 because of profound fatigue and generalized failure to thrive along with hypoxia and tachycardia) the first cycle was even given at a low dose 2. Followed by adjuvant radiation therapy (patient does not want to receive radiation)   Severe COPD: 24-hour oxygen -------------------------------------------------------------------------------------------------------------------------------------------- Hospitalization: GI bleed: 4 units of PRBC given November 2021   Relapse: Mammogram detected indeterminate left breast mass at 12 o'clock position measuring 6 mm, 2 focal areas of calcifications (H 8 mm) within the left breast favored to be fat necrosis. The 6 mm mass on biopsy came back as grade 3 IDC ER 30% weak, PR 0%, HER2 negative, Ki-67 20% Because she was high risk medical management was performed with Faslodex injections  08/16/2022:Left lumpectomy: Grade 3 poorly differentiated carcinoma 2.4 cm with DCIS, margins negative, lymphovascular invasion not identified, ER 30%, PR 0%, HER2 1+ negative, Ki-67 20%  Pathology counseling: I discussed the final pathology report of the patient provided  a copy of this report. I discussed the margins as well as lymph node surgeries. We also discussed the final staging along with previously performed ER/PR and HER-2/neu testing.  Treatment plan: Antiestrogen therapy with anastrozole Lung cancer: Under care of Dr. Julien Nordmann  Return to clinic in 3 months for survivorship care plan visit

## 2022-08-29 NOTE — Progress Notes (Signed)
Patient Care Team: Pleas Koch, NP as PCP - General (Internal Medicine) [name], MD as PCP - Cardiology (Cardiology) [name], MD as Consulting Physician (Cardiology) Erroll Luna, MD as Consulting Physician (General Surgery) Nicholas Lose, MD as Consulting Physician (Hematology and Oncology)  DIAGNOSIS: No diagnosis found.  SUMMARY OF ONCOLOGIC HISTORY: Oncology History  Primary malignant neoplasm of bronchus of left lower lobe ([place])  05/02/2018 Initial Diagnosis   Stage III squamous cell carcinoma of left lung ([place])   05/13/2018 - 06/24/2018 Chemotherapy   The patient had palonosetron (ALOXI) injection 0.25 mg, 0.25 mg, Intravenous,  Once, 7 of 7 cycles Administration: 0.25 mg (05/13/2018), 0.25 mg (06/10/2018), 0.25 mg (06/17/2018), 0.25 mg (05/20/2018), 0.25 mg (06/24/2018), 0.25 mg (05/27/2018), 0.25 mg (06/03/2018) CARBOplatin (PARAPLATIN) 300 mg in sodium chloride 0.9 % 250 mL chemo infusion, 300 mg (100 % of original dose 300 mg), Intravenous,  Once, 7 of 7 cycles Dose modification: 300 mg (original dose 300 mg, Cycle 1) Administration: 300 mg (05/13/2018), 300 mg (06/10/2018), 260 mg (06/17/2018), 300 mg (05/20/2018), 300 mg (06/24/2018), 300 mg (05/27/2018), 300 mg (06/03/2018) PACLitaxel (TAXOL) 96 mg in sodium chloride 0.9 % 250 mL chemo infusion (</= 80mg /m2), 45 mg/m2 = 96 mg, Intravenous,  Once, 7 of 7 cycles Administration: 96 mg (05/13/2018), 96 mg (06/10/2018), 96 mg (06/17/2018), 96 mg (05/20/2018), 96 mg (06/24/2018), 96 mg (05/27/2018), 96 mg (06/03/2018)  for chemotherapy treatment.    07/31/2018 - 07/15/2019 Chemotherapy   The patient had durvalumab (IMFINZI) 1,000 mg in sodium chloride 0.9 % 100 mL chemo infusion, 1,020 mg, Intravenous,  Once, 26 of 26 cycles Administration: 1,000 mg (07/31/2018), 1,000 mg (10/22/2018), 1,000 mg (11/05/2018), 1,000 mg (11/19/2018), 1,000 mg (12/03/2018), 1,000 mg (08/14/2018), 1,000 mg (12/17/2018), 1,000 mg (08/27/2018),  1,000 mg (09/10/2018), 1,000 mg (09/24/2018), 1,000 mg (10/11/2018), 1,000 mg (12/31/2018), 1,000 mg (01/14/2019), 1,120 mg (01/28/2019), 1,120 mg (02/11/2019), 1,120 mg (02/25/2019), 1,120 mg (03/11/2019), 1,120 mg (03/25/2019), 1,120 mg (04/08/2019), 1,120 mg (04/22/2019), 1,120 mg (05/06/2019), 1,120 mg (05/20/2019), 1,120 mg (06/03/2019), 1,120 mg (06/17/2019), 1,120 mg (07/01/2019), 1,120 mg (07/15/2019)  for chemotherapy treatment.    05/11/2020 - 05/13/2020 Chemotherapy   Received 1 cycle of Adriamycin and Cytoxan and discontinued it   11/17/2021 Cancer Staging   Staging form: Lung, AJCC 8th Edition - Clinical: Stage IIIB (cT3, cN2, cM0) - Signed by Curt Bears, MD on 11/17/2021   Malignant neoplasm of upper-outer quadrant of left breast in female, estrogen receptor negative ([place])  02/19/2020 Cancer Staging   Staging form: Breast, AJCC 8th Edition - Clinical stage from 02/19/2020: Stage IIB (cT2, cN0, cM0, G3, ER-, PR-, HER2-) - Signed by Gardenia Phlegm, NP on 02/25/2020   02/19/2020 Initial Diagnosis   Chest CT for lung cancer follow-up showed a left breast module. Mammogram and US showed a 3.7cm mass with calcifications at the 2 o'clock position in the left breast, no left axillary adenopathy. Biopsy showed IDC, grade 3, HER-2 negative (0), ER/PR negative, Ki67 40%.   04/08/2020 Surgery   Left lumpectomy [name]): three foci of IDC, grade 3, 3.3cm, 1.0cm, 0.7cm, with high grade DCIS, clear margins, 2 left axillary lymph nodes negative for carcinoma.    05/11/2020 - 05/13/2020 Chemotherapy   Received 1 cycle of Adriamycin and Cytoxan and discontinued it   10/18/2021 Relapse/Recurrence   Mammogram detected indeterminate left breast mass at 12 o'clock position measuring 6 mm, 2 focal areas of calcifications (H 8 mm) within the left breast favored to be  fat necrosis. The 6 mm mass on biopsy came back as grade 3 IDC ER 30% weak, PR 0%, HER2 negative, Ki-67 20%     CHIEF COMPLIANT: Follow-up  breast cancer on Faslodex   INTERVAL HISTORY: Deborah [name] is a 66 y.o. with the above- mentioned breast cancer. She presents to the clinic today for a follow-up on Faslodex injections.      ALLERGIES:  has No Known Allergies.  MEDICATIONS:  Current Outpatient Medications  Medication Sig Dispense Refill   acetaminophen (TYLENOL) 325 MG tablet Take 650 mg by mouth every 6 (six) hours as needed for headache (pain).     albuterol (VENTOLIN HFA) 108 (90 Base) MCG/ACT inhaler Inhale 2 puffs into the lungs every 6 (six) hours as needed for wheezing or shortness of breath. 8 g 3   aspirin 81 MG chewable tablet Chew 1 tablet (81 mg total) by mouth daily. 30 tablet 1   Budeson-Glycopyrrol-Formoterol (BREZTRI AEROSPHERE) 160-9-4.8 MCG/ACT AERO Inhale 2 puffs into the lungs daily. 5.9 g 5   Cholecalciferol (VITAMIN D3) 125 MCG (5000 UT) TABS Take 5,000 Units by mouth daily.     diltiazem (CARDIZEM CD) 240 MG 24 hr capsule TAKE 1 CAPSULE BY MOUTH EVERY DAY 90 capsule 2   furosemide (LASIX) 20 MG tablet Take 1.5 tablets (30 mg total) by mouth daily. 135 tablet 3   ipratropium-albuterol (DUONEB) 0.5-2.5 (3) MG/3ML SOLN Take 3 mLs by nebulization every 3 (three) hours as needed. (Patient not taking: Reported on 08/03/2022) 360 mL 11   lidocaine-prilocaine (EMLA) cream APPLY TO AFFECTED AREA ONCE AS DIRECTED (Patient not taking: Reported on 08/03/2022) 30 g 3   metFORMIN (GLUCOPHAGE-XR) 500 MG 24 hr tablet TAKE 1 TABLET BY MOUTH EVERY DAY WITH BREAKFAST FOR DIABETES 90 tablet 0   montelukast (SINGULAIR) 10 MG tablet Take 1 tablet (10 mg total) by mouth at bedtime. For allergies 90 tablet 3   OXYGEN Inhale 6 L/min into the lungs continuous.     potassium chloride (KLOR-CON M) 10 MEQ tablet Take 1 tablet (10 mEq total) by mouth daily. 90 tablet 3   rosuvastatin (CRESTOR) 5 MG tablet TAKE 1 TABLET BY MOUTH DAILY. FOR CHOLESTEROL. 90 tablet 3   Spacer/Aero-Holding Chambers (AEROCHAMBER MV) inhaler Use as  instructed 1 each 0   traMADol (ULTRAM) 50 MG tablet Take 1 tablet (50 mg total) by mouth every 6 (six) hours as needed. 20 tablet 0   No current facility-administered medications for this visit.    PHYSICAL EXAMINATION: ECOG PERFORMANCE STATUS: {CHL ONC ECOG [phone]}  There were no vitals filed for this visit. There were no vitals filed for this visit.  BREAST:*** No palpable masses or nodules in either right or left breasts. No palpable axillary supraclavicular or infraclavicular adenopathy no breast tenderness or nipple discharge. (exam performed in the presence of a chaperone)  LABORATORY DATA:  I have reviewed the data as listed    Latest Ref Rng & Units 08/08/2022    3:46 PM 05/16/2022    2:03 PM 11/15/2021   10:26 AM  CMP  Glucose 70 - 99 mg/dL 103  125  120   BUN 8 - 23 mg/dL 13  13  12    Creatinine 0.44 - 1.00 mg/dL 0.62  1.04  0.59   Sodium 135 - 145 mmol/L 137  139  138   Potassium 3.5 - 5.1 mmol/L 4.0  4.1  3.9   Chloride 98 - 111 mmol/L 99  99  97  CO2 22 - 32 mmol/L 29  32  37   Calcium 8.9 - 10.3 mg/dL 9.2  9.1  9.0   Total Protein 6.5 - 8.1 g/dL  7.8  7.5   Total Bilirubin 0.3 - 1.2 mg/dL  0.3  0.3   Alkaline Phos 38 - 126 U/L  67  89   AST 15 - 41 U/L  14  11   ALT 0 - 44 U/L  10  9     Lab Results  Component Value Date   WBC 11.7 (H) 08/08/2022   HGB 11.7 (L) 08/08/2022   HCT 38.0 08/08/2022   MCV 83.7 08/08/2022   PLT 328 08/08/2022   NEUTROABS 11.9 (H) 05/16/2022    ASSESSMENT & PLAN:  No problem-specific Assessment & Plan notes found for this encounter.    No orders of the defined types were placed in this encounter.  The patient has a good understanding of the overall plan. she agrees with it. she will call with any problems that may develop before the next visit here. Total time spent: 30 mins including face to face time and time spent for planning, charting and co-ordination of care   Suzzette Righter, [place] 08/29/22    I  Gardiner Coins am acting as a Education administrator for [employer]  ***

## 2022-08-30 ENCOUNTER — Inpatient Hospital Stay: Payer: BC Managed Care – PPO | Attending: Internal Medicine

## 2022-08-30 ENCOUNTER — Inpatient Hospital Stay (HOSPITAL_BASED_OUTPATIENT_CLINIC_OR_DEPARTMENT_OTHER): Payer: BC Managed Care – PPO | Admitting: Hematology and Oncology

## 2022-08-30 ENCOUNTER — Encounter: Payer: Self-pay | Admitting: Hematology and Oncology

## 2022-08-30 VITALS — BP 113/58 | HR 120 | Temp 98.1°F | Resp 18 | Ht 63.0 in | Wt 174.2 lb

## 2022-08-30 DIAGNOSIS — C50412 Malignant neoplasm of upper-outer quadrant of left female breast: Secondary | ICD-10-CM

## 2022-08-30 DIAGNOSIS — Z171 Estrogen receptor negative status [ER-]: Secondary | ICD-10-CM | POA: Insufficient documentation

## 2022-08-30 DIAGNOSIS — Z79811 Long term (current) use of aromatase inhibitors: Secondary | ICD-10-CM | POA: Insufficient documentation

## 2022-08-30 DIAGNOSIS — C3432 Malignant neoplasm of lower lobe, left bronchus or lung: Secondary | ICD-10-CM | POA: Insufficient documentation

## 2022-08-30 DIAGNOSIS — C3490 Malignant neoplasm of unspecified part of unspecified bronchus or lung: Secondary | ICD-10-CM

## 2022-08-30 DIAGNOSIS — Z95828 Presence of other vascular implants and grafts: Secondary | ICD-10-CM

## 2022-08-30 MED ORDER — ANASTROZOLE 1 MG PO TABS: 1.0000 mg | ORAL_TABLET | Freq: Every day | ORAL | 3 refills | Status: DC

## 2022-08-30 MED ORDER — SODIUM CHLORIDE 0.9% FLUSH
10.0000 mL | Freq: Once | INTRAVENOUS | Status: AC
  Administered 2022-08-30: 10 mL

## 2022-08-30 MED ORDER — HEPARIN SOD (PORK) LOCK FLUSH 100 UNIT/ML IV SOLN
500.0000 [IU] | Freq: Once | INTRAVENOUS | Status: AC
  Administered 2022-08-30: 500 [IU]

## 2022-10-25 ENCOUNTER — Inpatient Hospital Stay: Payer: BC Managed Care – PPO | Attending: Internal Medicine

## 2022-10-26 ENCOUNTER — Other Ambulatory Visit: Payer: Self-pay | Admitting: Medical

## 2022-10-26 DIAGNOSIS — I272 Pulmonary hypertension, unspecified: Secondary | ICD-10-CM

## 2022-10-26 DIAGNOSIS — I5032 Chronic diastolic (congestive) heart failure: Secondary | ICD-10-CM

## 2022-10-27 ENCOUNTER — Other Ambulatory Visit: Payer: Self-pay | Admitting: Primary Care

## 2022-10-27 DIAGNOSIS — E1165 Type 2 diabetes mellitus with hyperglycemia: Secondary | ICD-10-CM

## 2022-11-03 ENCOUNTER — Telehealth: Payer: Self-pay | Admitting: Internal Medicine

## 2022-11-03 NOTE — Telephone Encounter (Signed)
Rescheduled 05/08 appointment to 05/06 due to provider pal, patient has been called and voicemail was left.

## 2022-11-16 ENCOUNTER — Other Ambulatory Visit: Payer: BC Managed Care – PPO

## 2022-11-17 ENCOUNTER — Ambulatory Visit (HOSPITAL_COMMUNITY)
Admission: RE | Admit: 2022-11-17 | Discharge: 2022-11-17 | Disposition: A | Discharge status: Home / Self Care | Payer: BC Managed Care – PPO | Source: Ambulatory Visit | Attending: Internal Medicine | Admitting: Internal Medicine

## 2022-11-17 ENCOUNTER — Encounter (HOSPITAL_COMMUNITY): Payer: Self-pay

## 2022-11-17 ENCOUNTER — Inpatient Hospital Stay: Payer: BC Managed Care – PPO | Attending: Internal Medicine

## 2022-11-17 DIAGNOSIS — Z79811 Long term (current) use of aromatase inhibitors: Secondary | ICD-10-CM | POA: Insufficient documentation

## 2022-11-17 DIAGNOSIS — Z85118 Personal history of other malignant neoplasm of bronchus and lung: Secondary | ICD-10-CM | POA: Insufficient documentation

## 2022-11-17 DIAGNOSIS — C3490 Malignant neoplasm of unspecified part of unspecified bronchus or lung: Secondary | ICD-10-CM

## 2022-11-17 DIAGNOSIS — I700 Atherosclerosis of aorta: Secondary | ICD-10-CM | POA: Diagnosis not present

## 2022-11-17 DIAGNOSIS — C50412 Malignant neoplasm of upper-outer quadrant of left female breast: Secondary | ICD-10-CM | POA: Insufficient documentation

## 2022-11-17 DIAGNOSIS — J479 Bronchiectasis, uncomplicated: Secondary | ICD-10-CM | POA: Diagnosis not present

## 2022-11-17 DIAGNOSIS — J90 Pleural effusion, not elsewhere classified: Secondary | ICD-10-CM | POA: Diagnosis not present

## 2022-11-17 DIAGNOSIS — J439 Emphysema, unspecified: Secondary | ICD-10-CM | POA: Diagnosis not present

## 2022-11-17 LAB — CBC WITH DIFFERENTIAL (CANCER CENTER ONLY)
Abs Immature Granulocytes: 0.05 10*3/uL (ref 0.00–0.07)
Basophils Absolute: 0.1 10*3/uL (ref 0.0–0.1)
Basophils Relative: 1 %
Eosinophils Absolute: 0.2 10*3/uL (ref 0.0–0.5)
Eosinophils Relative: 2 %
HCT: 36 % (ref 36.0–46.0)
Hemoglobin: 11.3 g/dL — ABNORMAL LOW (ref 12.0–15.0)
Immature Granulocytes: 1 %
Lymphocytes Relative: 12 %
Lymphs Abs: 1.4 10*3/uL (ref 0.7–4.0)
MCH: 25.5 pg — ABNORMAL LOW (ref 26.0–34.0)
MCHC: 31.4 g/dL (ref 30.0–36.0)
MCV: 81.1 fL (ref 80.0–100.0)
Monocytes Absolute: 1 10*3/uL (ref 0.1–1.0)
Monocytes Relative: 9 %
Neutro Abs: 8.2 10*3/uL — ABNORMAL HIGH (ref 1.7–7.7)
Neutrophils Relative %: 75 %
Platelet Count: 319 10*3/uL (ref 150–400)
RBC: 4.44 MIL/uL (ref 3.87–5.11)
RDW: 15.5 % (ref 11.5–15.5)
WBC Count: 10.9 10*3/uL — ABNORMAL HIGH (ref 4.0–10.5)
nRBC: 0 % (ref 0.0–0.2)

## 2022-11-17 LAB — CMP (CANCER CENTER ONLY)
ALT: 10 U/L (ref 0–44)
AST: 11 U/L — ABNORMAL LOW (ref 15–41)
Albumin: 3.8 g/dL (ref 3.5–5.0)
Alkaline Phosphatase: 66 U/L (ref 38–126)
Anion gap: 6 (ref 5–15)
BUN: 14 mg/dL (ref 8–23)
CO2: 32 mmol/L (ref 22–32)
Calcium: 9.4 mg/dL (ref 8.9–10.3)
Chloride: 102 mmol/L (ref 98–111)
Creatinine: 0.6 mg/dL (ref 0.44–1.00)
GFR, Estimated: >60 mL/min (ref >60)
Glucose, Bld: 84 mg/dL (ref 70–99)
Potassium: 3.9 mmol/L (ref 3.5–5.1)
Sodium: 140 mmol/L (ref 135–145)
Total Bilirubin: 0.2 mg/dL — ABNORMAL LOW (ref 0.3–1.2)
Total Protein: 7.5 g/dL (ref 6.5–8.1)

## 2022-11-17 MED ORDER — IOHEXOL 300 MG/ML  SOLN
75.0000 mL | Freq: Once | INTRAMUSCULAR | Status: AC | PRN
  Administered 2022-11-17: 75 mL via INTRAVENOUS

## 2022-11-17 MED ORDER — SODIUM CHLORIDE (PF) 0.9 % IJ SOLN
INTRAMUSCULAR | Status: AC
  Filled 2022-11-17: qty 50

## 2022-11-20 ENCOUNTER — Ambulatory Visit: Payer: BC Managed Care – PPO | Admitting: Internal Medicine

## 2022-11-20 ENCOUNTER — Inpatient Hospital Stay: Payer: BC Managed Care – PPO | Admitting: Internal Medicine

## 2022-11-20 VITALS — BP 117/77 | HR 119 | Temp 98.0°F | Resp 17 | Wt 179.9 lb

## 2022-11-20 DIAGNOSIS — C3432 Malignant neoplasm of lower lobe, left bronchus or lung: Secondary | ICD-10-CM

## 2022-11-20 DIAGNOSIS — C3490 Malignant neoplasm of unspecified part of unspecified bronchus or lung: Secondary | ICD-10-CM | POA: Diagnosis not present

## 2022-11-20 DIAGNOSIS — C50412 Malignant neoplasm of upper-outer quadrant of left female breast: Secondary | ICD-10-CM | POA: Diagnosis not present

## 2022-11-20 DIAGNOSIS — Z79811 Long term (current) use of aromatase inhibitors: Secondary | ICD-10-CM | POA: Diagnosis not present

## 2022-11-20 DIAGNOSIS — Z85118 Personal history of other malignant neoplasm of bronchus and lung: Secondary | ICD-10-CM | POA: Diagnosis not present

## 2022-11-20 NOTE — Progress Notes (Signed)
[facility] [phone]   [phone]  OFFICE PROGRESS NOTE  Doreene Nest, NP [address] [place] [address]  DIAGNOSIS:  1) stage IIIB (T3, N2, M0) non-small cell lung cancer, squamous cell carcinoma presented with large left lower lobe lung mass in addition to mediastinal lymphadenopathy diagnosed in October 2019. PDL 1 expression is negative 2) recurrent breast cancer initially diagnosed as stage IIb (T2, N0, M0) left breast invasive ductal carcinoma involving the upper outer quadrant of the left breast diagnosed in August 2021.  PRIOR THERAPY:  1) Concurrent chemoradiation with weekly carboplatin for AUC of 2 and paclitaxel 45 mg/M2.  Status post 7 cycles with partial response. 2) Consolidation treatment with immunotherapy with Imfinzi 10 mg/KG every 2 weeks.  First dose July 31, 2018.  Status post 26 cycles. 3) status post left lumpectomy followed by adjuvant chemotherapy with Adriamycin, Cytoxan and Taxol  CURRENT THERAPY: Femara 2.5 mg p.o. daily under the care of Dr. Pamelia Hoit started on November 11, 2021  INTERVAL HISTORY: Deborah [name] 67 y.o. female returns to clinic today for follow-up visit accompanied by her husband.  The patient is feeling fine today with no concerning complaints except for the baseline shortness of breath increased with exertion.  She denied having any chest pain but has mild cough with no hemoptysis.  She has no nausea, vomiting, diarrhea or constipation.  She has no headache or visual changes.  She denied having any recent weight loss or night sweats.  She has no recent headache or visual changes.  The patient had repeat CT scan of the chest performed recently and she is here for evaluation and discussion of her scan results.  MEDICAL HISTORY: Past Medical History:  Diagnosis Date   Anemia    as teen   Asthma    Breast cancer (HCC)    left breast cancer 02/2020   CHF (congestive heart failure) (HCC)     COPD, mild (HCC)    Depression    Diabetes mellitus without complication (HCC)    Diverticulitis    Family history of anesthesia complication    vomiting   GI bleeding    Heart failure (HCC)    New onset 07/25/14   Histoplasmosis    left eye   Hyperkalemia    Hypertension    Lung cancer (HCC) 05/02/2018   s/p chemoradiation, immunotherapy   Obesity (BMI 30-39.9)    Pneumonia    dx wtih pneumonia on 05/27/16- seen by [facility]    PONV (postoperative nausea and vomiting)    Restrictive lung disease    Shortness of breath    with exertion    Sleep apnea    mask and oxygen at nite for sleep at 2L    Tobacco abuse    Umbilical hernia     ALLERGIES:  has No Known Allergies.  MEDICATIONS:  Current Outpatient Medications  Medication Sig Dispense Refill   acetaminophen (TYLENOL) 325 MG tablet Take 650 mg by mouth every 6 (six) hours as needed for headache (pain).     albuterol (VENTOLIN HFA) 108 (90 Base) MCG/ACT inhaler Inhale 2 puffs into the lungs every 6 (six) hours as needed for wheezing or shortness of breath. 8 g 3   anastrozole (ARIMIDEX) 1 MG tablet Take 1 tablet (1 mg total) by mouth daily. 90 tablet 3   aspirin 81 MG chewable tablet Chew 1 tablet (81 mg total) by mouth daily. 30 tablet  1   Budeson-Glycopyrrol-Formoterol (BREZTRI AEROSPHERE) 160-9-4.8 MCG/ACT AERO Inhale 2 puffs into the lungs daily. 5.9 g 5   Cholecalciferol (VITAMIN D3) 125 MCG (5000 UT) TABS Take 5,000 Units by mouth daily.     diltiazem (CARDIZEM CD) 240 MG 24 hr capsule TAKE 1 CAPSULE BY MOUTH EVERY DAY 90 capsule 2   furosemide (LASIX) 20 MG tablet TAKE 1 AND 1/2 TABLETS DAILY BY MOUTH 135 tablet 3   ipratropium-albuterol (DUONEB) 0.5-2.5 (3) MG/3ML SOLN Take 3 mLs by nebulization every 3 (three) hours as needed. (Patient not taking: Reported on 08/03/2022) 360 mL 11   lidocaine-prilocaine (EMLA) cream APPLY TO AFFECTED AREA ONCE AS DIRECTED (Patient not taking: Reported on 08/03/2022) 30 g 3    metFORMIN (GLUCOPHAGE-XR) 500 MG 24 hr tablet TAKE 1 TABLET BY MOUTH EVERY DAY WITH BREAKFAST FOR DIABETES 90 tablet 0   montelukast (SINGULAIR) 10 MG tablet Take 1 tablet (10 mg total) by mouth at bedtime. For allergies 90 tablet 3   OXYGEN Inhale 6 L/min into the lungs continuous.     potassium chloride (KLOR-CON M) 10 MEQ tablet Take 1 tablet (10 mEq total) by mouth daily. 90 tablet 3   rosuvastatin (CRESTOR) 5 MG tablet TAKE 1 TABLET BY MOUTH DAILY. FOR CHOLESTEROL. 90 tablet 3   Spacer/Aero-Holding Chambers (AEROCHAMBER MV) inhaler Use as instructed 1 each 0   No current facility-administered medications for this visit.    SURGICAL HISTORY:  Past Surgical History:  Procedure Laterality Date   APPLICATION OF WOUND VAC  03/19/2013   Procedure: APPLICATION OF WOUND VAC;  Surgeon: Lodema Pilot, DO;  Location: [facility];  Service: General;;   BREAST BIOPSY Left 09/29/2021   BREAST BIOPSY Left 10/18/2021   BREAST LUMPECTOMY Left 04/08/2020   BREAST LUMPECTOMY Left 08/16/2022   Procedure: LEFT BREAST LUMPECTOMY;  Surgeon: Harriette Bouillon, MD;  Location: [facility] OR;  Service: General;  Laterality: Left;   BREAST LUMPECTOMY WITH RADIOACTIVE SEED AND SENTINEL LYMPH NODE BIOPSY Left 04/08/2020   Procedure: LEFT BREAST LUMPECTOMY WITH RADIOACTIVE SEED AND LEFT AXILLARY SENTINEL LYMPH NODE BIOPSY;  Surgeon: Ovidio Kin, MD;  [facility];  Service: General;  Laterality: Left;  PEC BLOCK, NEEDS ANESTHESIA CONSULT   CESAREAN SECTION  04/12/1985   COLONOSCOPY WITH PROPOFOL N/A 06/13/2016   Procedure: COLONOSCOPY WITH PROPOFOL;  Surgeon: Beverley Fiedler, MD;  Location: [facility];  Service: Gastroenterology;  Laterality: N/A;   ERCP N/A 02/15/2017   Procedure: ENDOSCOPIC RETROGRADE CHOLANGIOPANCREATOGRAPHY (ERCP);  Surgeon: Rachael Fee, MD;  Location: [facility];  Service: Endoscopy;  Laterality: N/A;   EYE SURGERY Left    INSERTION OF MESH N/A 03/19/2013   Procedure: INSERTION OF MESH;  Surgeon:  Lodema Pilot, DO;  Location: [facility];  Service: General;  Laterality: N/A;   IR IMAGING GUIDED PORT INSERTION  03/26/2020   IR THORACENTESIS ASP PLEURAL SPACE W/IMG GUIDE  05/25/2020   VENTRAL HERNIA REPAIR N/A 03/19/2013   Procedure:  OPEN VENTRAL HERNIA REPAIR WITH MESH AND APPLICATION OF WOUND VAC;  Surgeon: Lodema Pilot, DO;  Location: [facility];  Service: General;  Laterality: N/A;   VIDEO BRONCHOSCOPY Bilateral 04/19/2018   Procedure: VIDEO BRONCHOSCOPY WITH FLUORO;  Surgeon: Oretha Milch, MD;  Location: [facility];  Service: Cardiopulmonary;  Laterality: Bilateral;    REVIEW OF SYSTEMS:  A comprehensive review of systems was negative except for: Respiratory: positive for cough and dyspnea on exertion   PHYSICAL EXAMINATION: General appearance: alert, cooperative, fatigued, and no distress Head:  Normocephalic, without obvious abnormality, atraumatic Neck: no adenopathy, no JVD, supple, symmetrical, trachea midline, and thyroid not enlarged, symmetric, no tenderness/mass/nodules Lymph nodes: Cervical, supraclavicular, and axillary nodes normal. Resp: clear to auscultation bilaterally Back: symmetric, no curvature. ROM normal. No CVA tenderness. Cardio: regular rate and rhythm, S1, S2 normal, no murmur, click, rub or gallop GI: soft, non-tender; bowel sounds normal; no masses,  no organomegaly Extremities: extremities normal, atraumatic, no cyanosis or edema  ECOG PERFORMANCE STATUS: 1 - Symptomatic but completely ambulatory  Blood pressure 117/77, pulse (!) 119, temperature 98 F (36.7 C), temperature source Oral, resp. rate 17, weight 179 lb 14.4 oz (81.6 kg), SpO2 94 %.  LABORATORY DATA: Lab Results  Component Value Date   WBC 10.9 (H) 11/17/2022   HGB 11.3 (L) 11/17/2022   HCT 36.0 11/17/2022   MCV 81.1 11/17/2022   PLT 319 11/17/2022      Chemistry      Component Value Date/Time   NA 140 11/17/2022 0956   NA 143 11/23/2016 1504   K 3.9 11/17/2022 0956   CL 102  11/17/2022 0956   CO2 32 11/17/2022 0956   BUN 14 11/17/2022 0956   BUN 14 11/23/2016 1504   CREATININE 0.60 11/17/2022 0956      Component Value Date/Time   CALCIUM 9.4 11/17/2022 0956   ALKPHOS 66 11/17/2022 0956   AST 11 (L) 11/17/2022 0956   ALT 10 11/17/2022 0956   BILITOT 0.2 (L) 11/17/2022 0956       RADIOGRAPHIC STUDIES: No results found.   ASSESSMENT AND PLAN: This is a very pleasant 67 years old white female with stage IIIB non-small cell lung cancer, squamous cell carcinoma. She underwent a course of concurrent chemoradiation with weekly carboplatin and paclitaxel, status post 7 cycles.  She tolerated this treatment well with no concerning adverse effect except for fatigue. She underwent consolidation treatment with immunotherapy with Imfinzi status post 26 cycles. The patient was also diagnosed with a stage IIb left breast cancer s/p lumpectomy followed by adjuvant systemic chemotherapy with Adriamycin, Cytoxan and Taxol.  She was followed by Dr. Pamelia Hoit at that time.  She was recently found to have recurrence of her disease and she is started on treatment with Femara 2.5 mg p.o. daily by Dr. Pamelia Hoit on 11/11/2021. The patient is currently on observation and she is feeling fine with no concerning complaints except for the baseline shortness of breath increased with exertion. She had repeat CT scan of the chest performed on 11/17/2022.  The final report is still pending but I personally and independently reviewed the scan images and I do not see clear evidence for disease progression but I would wait for the final report for confirmation. I recommended for the patient to continue on observation with repeat CT scan of the chest in 6 months. The patient was advised to call immediately if she has any other concerning symptoms in the interval. The patient voices understanding of current disease status and treatment options and is in agreement with the current care plan.  All questions  were answered. The patient knows to call the clinic with any problems, questions or concerns. We can certainly see the patient much sooner if necessary.  Disclaimer: This note was dictated with voice recognition software. Similar sounding words can inadvertently be transcribed and may not be corrected upon review.

## 2022-11-22 ENCOUNTER — Telehealth: Payer: Self-pay | Admitting: Hematology and Oncology

## 2022-11-22 ENCOUNTER — Ambulatory Visit: Payer: BC Managed Care – PPO | Admitting: Hematology and Oncology

## 2022-11-22 ENCOUNTER — Inpatient Hospital Stay: Payer: BC Managed Care – PPO | Admitting: Hematology and Oncology

## 2022-11-22 ENCOUNTER — Ambulatory Visit: Payer: BC Managed Care – PPO | Admitting: Internal Medicine

## 2022-11-22 NOTE — Assessment & Plan Note (Deleted)
04/08/20: Left lumpectomy Ezz[facility] Standing): three foci of IDC, grade 3, 3.3cm, 1.0cm, 0.7cm, with high grade DCIS, clear margins, 2 left axillary lymph nodes negative for carcinoma. HER-2 negative (0), ER/PR negative, Ki67 40%.     Treatment Plan: 1. Adjuvant chemotherapy with Adriamycin and Cytoxan dose dense 4 followed by Taxol weekly 12 (discontinued after cycle 1 because of profound fatigue and generalized failure to thrive along with hypoxia and tachycardia) the first cycle was even given at a low dose 2. Followed by adjuvant radiation therapy (patient does not want to receive radiation)   Severe COPD: 24-hour oxygen -------------------------------------------------------------------------------------------------------------------------------------------- Hospitalization: GI bleed: 4 units of PRBC given November 2021   Relapse: Mammogram detected indeterminate left breast mass at 12 o'clock position measuring 6 mm, 2 focal areas of calcifications (H 8 mm) within the left breast favored to be fat necrosis. The 6 mm mass on biopsy came back as grade 3 IDC ER 30% weak, PR 0%, HER2 negative, Ki-67 20% Because she was high risk medical management was performed with Faslodex injections   08/16/2022:Left lumpectomy: Grade 3 poorly differentiated carcinoma 2.4 cm with DCIS, margins negative, lymphovascular invasion not identified, ER 30%, PR 0%, HER2 1+ negative, Ki-67 20%  Current Treatment: Antiestrogen therapy with anastrozole Anastrozole toxicities:  Lung cancer: Under care of Dr. Arbutus Ped

## 2022-11-23 ENCOUNTER — Telehealth: Payer: Self-pay | Admitting: Hematology and Oncology

## 2022-11-27 ENCOUNTER — Inpatient Hospital Stay: Payer: BC Managed Care – PPO | Admitting: Adult Health

## 2022-11-28 ENCOUNTER — Ambulatory Visit (INDEPENDENT_AMBULATORY_CARE_PROVIDER_SITE_OTHER): Payer: BC Managed Care – PPO | Admitting: Primary Care

## 2022-11-28 VITALS — BP 120/74 | HR 94 | Temp 98.2°F | Ht 63.0 in | Wt 181.0 lb

## 2022-11-28 DIAGNOSIS — E1165 Type 2 diabetes mellitus with hyperglycemia: Secondary | ICD-10-CM | POA: Diagnosis not present

## 2022-11-28 DIAGNOSIS — Z7985 Long-term (current) use of injectable non-insulin antidiabetic drugs: Secondary | ICD-10-CM | POA: Diagnosis not present

## 2022-11-28 DIAGNOSIS — Z7984 Long term (current) use of oral hypoglycemic drugs: Secondary | ICD-10-CM | POA: Diagnosis not present

## 2022-11-28 LAB — POCT GLYCOSYLATED HEMOGLOBIN (HGB A1C): Hemoglobin A1C: 5.8 % — AB (ref 4.0–5.6)

## 2022-11-28 MED ORDER — LANCET DEVICE MISC
1.0000 | Freq: Three times a day (TID) | 0 refills | Status: AC
Start: 2022-11-28 — End: 2022-12-28

## 2022-11-28 MED ORDER — BLOOD GLUCOSE MONITORING SUPPL DEVI
1.0000 | Freq: Three times a day (TID) | 0 refills | Status: AC
Start: 2022-11-28 — End: ?

## 2022-11-28 MED ORDER — LANCETS MISC. MISC
1.0000 | Freq: Three times a day (TID) | 0 refills | Status: AC
Start: 2022-11-28 — End: 2022-12-28

## 2022-11-28 MED ORDER — OZEMPIC (0.25 OR 0.5 MG/DOSE) 2 MG/3ML ~~LOC~~ SOPN
PEN_INJECTOR | SUBCUTANEOUS | 0 refills | Status: DC
Start: 2022-11-28 — End: 2023-02-16

## 2022-11-28 MED ORDER — BLOOD GLUCOSE TEST VI STRP
1.0000 | ORAL_STRIP | Freq: Three times a day (TID) | 0 refills | Status: DC
Start: 2022-11-28 — End: 2023-01-16

## 2022-11-28 NOTE — Patient Instructions (Signed)
Start semaglutide (Ozempic) for diabetes/weight loss. Start by injecting 0.25 mg into the skin once weekly for 4 weeks, then increase to 0.5 mg once weekly thereafter.   Stop metformin once you start Ozempic.  Start checking your blood sugar levels.  Appropriate times to check your blood sugar levels are:  -Before any meal (breakfast, lunch, dinner) -Two hours after any meal (breakfast, lunch, dinner) -Bedtime  Record your readings and notify me if you continue to consistently run at or above 150.   Please schedule a physical to meet with me in 3 months.   It was a pleasure to see you today!

## 2022-11-28 NOTE — Assessment & Plan Note (Addendum)
Controlled but deteriorated with A1C today of 5.8.  Agree to try Ozempic for diabetes and weight loss. Start by injecting 0.25 mg into the skin once weekly for 4 weeks, then increase to 0.5 mg once weekly thereafter.  Discussed to stop metformin once she begins Ozempic. Rx for glucometer kit sent to pharmacy.  Foot exam today. Follow up in 3 months

## 2022-11-28 NOTE — Progress Notes (Signed)
Subjective:    Patient ID: Deborah [name], female    DOB: 1956-03-21, 67 y.o.   MRN: [id number]  HPI  Deborah [name] is a very pleasant 67 y.o. female with a history of hypertension, CHF, chronic respiratory failure, COPD, type 2 diabetes, breast cancer, hyperlipidemia who presents today for follow up of diabetes.  Current medications include: metformin XR 500 mg daily. She is interested in trying Ozempic for diabetes and for food cravings.   She is checking her blood glucose 0 times daily. She does not have a glucometer kit.  Last A1C: 5.3 in January 2024, 5.8 today.  Last Eye Exam: UTD Last Foot Exam: Due Pneumonia Vaccination: 2018 Urine Microalbumin: UTD Statin: rosuvastatin   Dietary changes since last visit: Reducing intake of sweets.    Exercise: None  BP Readings from Last 3 Encounters:  11/28/22 120/74  11/20/22 117/77  08/30/22 (!) 113/58       Review of Systems  Respiratory:  Positive for shortness of breath.   Cardiovascular:  Negative for chest pain.  Neurological:  Negative for dizziness and numbness.         Past Medical History:  Diagnosis Date   Anemia    as teen   Asthma    Breast cancer (HCC)    left breast cancer 02/2020   CHF (congestive heart failure) (HCC)    COPD, mild (HCC)    Depression    Diabetes mellitus without complication (HCC)    Diverticulitis    Family history of anesthesia complication    vomiting   GI bleeding    Heart failure (HCC)    New onset 07/25/14   Histoplasmosis    left eye   Hyperkalemia    Hypertension    Lung cancer (HCC) 05/02/2018   s/p chemoradiation, immunotherapy   Obesity (BMI 30-39.9)    Pneumonia    dx wtih pneumonia on 05/27/16- seen by [facility]    PONV (postoperative nausea and vomiting)    Restrictive lung disease    Shortness of breath    with exertion    Sleep apnea    mask and oxygen at nite for sleep at 2L    Tobacco abuse    Umbilical hernia     Social History    Socioeconomic History   Marital status: Married    Spouse name: Not on file   Number of children: 1   Years of education: Not on file   Highest education level: 12th grade  Occupational History   Occupation: Retired    Associate Professor: NOT EMPLOYED  Tobacco Use   Smoking status: Former    Packs/day: 3.00    Years: 43.00    Additional pack years: 0.00    Total pack years: 129.00    Types: Cigarettes    Quit date: 12/17/2012    Years since quitting: 9.9   Smokeless tobacco: Never  Vaping Use   Vaping Use: Former  Substance and Sexual Activity   Alcohol use: No    Alcohol/week: 0.0 standard drinks of alcohol   Drug use: No   Sexual activity: Not on file  Other Topics Concern   Not on file  Social History Narrative   ** Merged History Encounter **       ** Data from: 07/25/14 Enc Dept: [facility]       ** Data from: 06/05/13 Enc Dept: [facility]   Married, one son 14 yo.  Social Determinants of Health   Financial Resource Strain: Low Risk  (11/28/2022)   Overall Financial Resource Strain (CARDIA)    Difficulty of Paying Living Expenses: Not hard at all  Food Insecurity: No Food Insecurity (11/28/2022)   Hunger Vital Sign    Worried About Running Out of Food in the Last Year: Never true    Ran Out of Food in the Last Year: Never true  Transportation Needs: No Transportation Needs (11/28/2022)   PRAPARE - Administrator, Civil Service (Medical): No    Lack of Transportation (Non-Medical): No  Physical Activity: Unknown (11/28/2022)   Exercise Vital Sign    Days of Exercise per Week: 0 days    Minutes of Exercise per Session: Not on file  Stress: Stress Concern Present (11/28/2022)   [employer] of Occupational Health - Occupational Stress Questionnaire    Feeling of Stress : Very much  Social Connections: Moderately Isolated (11/28/2022)   Social Connection and Isolation Panel [NHANES]    Frequency of Communication with Friends and  Family: Three times a week    Frequency of Social Gatherings with Friends and Family: Once a week    Attends Religious Services: Never    Database administrator or Organizations: No    Attends Engineer, structural: Not on file    Marital Status: Married  Catering manager Violence: Not on file    Past Surgical History:  Procedure Laterality Date   APPLICATION OF WOUND VAC  03/19/2013   Procedure: APPLICATION OF WOUND VAC;  Surgeon: Lodema Pilot, DO;  Location: [facility];  Service: General;;   BREAST BIOPSY Left 09/29/2021   BREAST BIOPSY Left 10/18/2021   BREAST LUMPECTOMY Left 04/08/2020   BREAST LUMPECTOMY Left 08/16/2022   Procedure: LEFT BREAST LUMPECTOMY;  Surgeon: Harriette Bouillon, MD;  Location: [facility] OR;  Service: General;  Laterality: Left;   BREAST LUMPECTOMY WITH RADIOACTIVE SEED AND SENTINEL LYMPH NODE BIOPSY Left 04/08/2020   Procedure: LEFT BREAST LUMPECTOMY WITH RADIOACTIVE SEED AND LEFT AXILLARY SENTINEL LYMPH NODE BIOPSY;  Surgeon: Ovidio Kin, MD;  Location: [facility] OR;  Service: General;  Laterality: Left;  PEC BLOCK, NEEDS ANESTHESIA CONSULT   CESAREAN SECTION  04/12/1985   COLONOSCOPY WITH PROPOFOL N/A 06/13/2016   Procedure: COLONOSCOPY WITH PROPOFOL;  Surgeon: Beverley Fiedler, MD;  Location: [facility];  Service: Gastroenterology;  Laterality: N/A;   ERCP N/A 02/15/2017   Procedure: ENDOSCOPIC RETROGRADE CHOLANGIOPANCREATOGRAPHY (ERCP);  Surgeon: Rachael Fee, MD;  Location: [facility];  Service: Endoscopy;  Laterality: N/A;   EYE SURGERY Left    INSERTION OF MESH N/A 03/19/2013   Procedure: INSERTION OF MESH;  Surgeon: Lodema Pilot, DO;  Location: [facility];  Service: General;  Laterality: N/A;   IR IMAGING GUIDED PORT INSERTION  03/26/2020   IR THORACENTESIS ASP PLEURAL SPACE W/IMG GUIDE  05/25/2020   VENTRAL HERNIA REPAIR N/A 03/19/2013   Procedure:  OPEN VENTRAL HERNIA REPAIR WITH MESH AND APPLICATION OF WOUND VAC;  Surgeon: Lodema Pilot, DO;  Location: [facility];  Service: General;  Laterality: N/A;   VIDEO BRONCHOSCOPY Bilateral 04/19/2018   Procedure: VIDEO BRONCHOSCOPY WITH FLUORO;  Surgeon: Oretha Milch, MD;  Location: [facility];  Service: Cardiopulmonary;  Laterality: Bilateral;    Family History  Problem Relation Age of Onset   Heart attack Mother    Emphysema Mother        was a smoker   Congestive Heart Failure Father    Bipolar disorder  Father    Lymphoma Maternal Uncle    Pancreatic cancer Maternal Aunt    Bipolar disorder Sister    Schizophrenia Sister    Other Sister        tumor in her neck   Melanoma Sister    Heart attack Maternal Uncle    Heart attack Maternal Uncle    Colon cancer Neg Hx    Breast cancer Neg Hx     No Known Allergies  Current Outpatient Medications on File Prior to Visit  Medication Sig Dispense Refill   acetaminophen (TYLENOL) 325 MG tablet Take 650 mg by mouth every 6 (six) hours as needed for headache (pain).     albuterol (VENTOLIN HFA) 108 (90 Base) MCG/ACT inhaler Inhale 2 puffs into the lungs every 6 (six) hours as needed for wheezing or shortness of breath. 8 g 3   anastrozole (ARIMIDEX) 1 MG tablet Take 1 tablet (1 mg total) by mouth daily. 90 tablet 3   aspirin 81 MG chewable tablet Chew 1 tablet (81 mg total) by mouth daily. 30 tablet 1   Budeson-Glycopyrrol-Formoterol (BREZTRI AEROSPHERE) 160-9-4.8 MCG/ACT AERO Inhale 2 puffs into the lungs daily. 5.9 g 5   Cholecalciferol (VITAMIN D3) 125 MCG (5000 UT) TABS Take 5,000 Units by mouth daily.     diltiazem (CARDIZEM CD) 240 MG 24 hr capsule TAKE 1 CAPSULE BY MOUTH EVERY DAY 90 capsule 2   furosemide (LASIX) 20 MG tablet TAKE 1 AND 1/2 TABLETS DAILY BY MOUTH 135 tablet 3   metFORMIN (GLUCOPHAGE-XR) 500 MG 24 hr tablet TAKE 1 TABLET BY MOUTH EVERY DAY WITH BREAKFAST FOR DIABETES 90 tablet 0   montelukast (SINGULAIR) 10 MG tablet Take 1 tablet (10 mg total) by mouth at bedtime. For allergies 90 tablet 3   OXYGEN Inhale 6 L/min into the  lungs continuous.     potassium chloride (KLOR-CON M) 10 MEQ tablet Take 1 tablet (10 mEq total) by mouth daily. 90 tablet 3   rosuvastatin (CRESTOR) 5 MG tablet TAKE 1 TABLET BY MOUTH DAILY. FOR CHOLESTEROL. 90 tablet 3   Spacer/Aero-Holding Chambers (AEROCHAMBER MV) inhaler Use as instructed 1 each 0   ipratropium-albuterol (DUONEB) 0.5-2.5 (3) MG/3ML SOLN Take 3 mLs by nebulization every 3 (three) hours as needed. (Patient not taking: Reported on 08/03/2022) 360 mL 11   lidocaine-prilocaine (EMLA) cream APPLY TO AFFECTED AREA ONCE AS DIRECTED (Patient not taking: Reported on 08/03/2022) 30 g 3   [DISCONTINUED] prochlorperazine (COMPAZINE) 10 MG tablet Take 1 tablet (10 mg total) by mouth every 6 (six) hours as needed (Nausea or vomiting). 30 tablet 1   No current facility-administered medications on file prior to visit.    BP 120/74   Pulse 94   Temp 98.2 F (36.8 C) (Temporal)   Ht 5\' 3"  (1.6 m)   Wt 181 lb (82.1 kg)   SpO2 99% Comment: 2 L of O2  BMI 32.06 kg/m  Objective:   Physical Exam Cardiovascular:     Rate and Rhythm: Normal rate and regular rhythm.  Pulmonary:     Effort: Pulmonary effort is normal.     Breath sounds: Wheezing and rhonchi present.  Musculoskeletal:     Cervical back: Neck supple.  Skin:    General: Skin is warm and dry.           Assessment & Plan:  Type 2 diabetes mellitus with hyperglycemia, without long-term current use of insulin (HCC) Assessment & Plan: Controlled but deteriorated with A1C today of  5.8.  Agree to try Ozempic for diabetes and weight loss. Start by injecting 0.25 mg into the skin once weekly for 4 weeks, then increase to 0.5 mg once weekly thereafter.  Discussed to stop metformin once she begins Ozempic. Rx for glucometer kit sent to pharmacy.  Foot exam today. Follow up in 3 months  Orders: -     POCT glycosylated hemoglobin (Hb A1C) -     Blood Glucose Monitoring Suppl; 1 each by Does not apply route in the  morning, at noon, and at bedtime. May substitute to any manufacturer covered by patient's insurance.  Dispense: 1 each; Refill: 0 -     Blood Glucose Test; 1 each by In Vitro route in the morning, at noon, and at bedtime. May substitute to any manufacturer covered by patient's insurance.  Dispense: 100 strip; Refill: 0 -     Lancet Device; 1 each by Does not apply route in the morning, at noon, and at bedtime. May substitute to any manufacturer covered by patient's insurance.  Dispense: 1 each; Refill: 0 -     Lancets Misc.; 1 each by Does not apply route in the morning, at noon, and at bedtime. May substitute to any manufacturer covered by patient's insurance.  Dispense: 100 each; Refill: 0 -     Ozempic (0.25 or 0.5 MG/DOSE); Inject 0.25 mg into the skin once weekly for 4 weeks, then increase to 0.5 mg once weekly thereafter for diabetes  Dispense: 9 mL; Refill: 0        Doreene Nest, NP

## 2022-12-05 NOTE — Progress Notes (Signed)
Patient Care Team: Doreene Nest, NP as PCP - General (Internal Medicine) Iran Ouch, MD as PCP - Cardiology (Cardiology) [place] Ouch, MD as Consulting Physician (Cardiology) Harriette Bouillon, MD as Consulting Physician (General Surgery) Serena Croissant, MD as Consulting Physician (Hematology and Oncology)  DIAGNOSIS:  Encounter Diagnosis  Name Primary?   Malignant neoplasm of upper-outer quadrant of left breast in female, estrogen receptor negative (HCC) Yes    SUMMARY OF ONCOLOGIC HISTORY: Oncology History  Primary malignant neoplasm of bronchus of left lower lobe (HCC)  05/02/2018 Initial Diagnosis   Stage III squamous cell carcinoma of left lung (HCC)   05/13/2018 - 06/24/2018 Chemotherapy   The patient had palonosetron (ALOXI) injection 0.25 mg, 0.25 mg, Intravenous,  Once, 7 of 7 cycles Administration: 0.25 mg (05/13/2018), 0.25 mg (06/10/2018), 0.25 mg (06/17/2018), 0.25 mg (05/20/2018), 0.25 mg (06/24/2018), 0.25 mg (05/27/2018), 0.25 mg (06/03/2018) CARBOplatin (PARAPLATIN) 300 mg in sodium chloride 0.9 % 250 mL chemo infusion, 300 mg (100 % of original dose 300 mg), Intravenous,  Once, 7 of 7 cycles Dose modification: 300 mg (original dose 300 mg, Cycle 1) Administration: 300 mg (05/13/2018), 300 mg (06/10/2018), 260 mg (06/17/2018), 300 mg (05/20/2018), 300 mg (06/24/2018), 300 mg (05/27/2018), 300 mg (06/03/2018) PACLitaxel (TAXOL) 96 mg in sodium chloride 0.9 % 250 mL chemo infusion (</= 80mg /m2), 45 mg/m2 = 96 mg, Intravenous,  Once, 7 of 7 cycles Administration: 96 mg (05/13/2018), 96 mg (06/10/2018), 96 mg (06/17/2018), 96 mg (05/20/2018), 96 mg (06/24/2018), 96 mg (05/27/2018), 96 mg (06/03/2018)  for chemotherapy treatment.    07/31/2018 - 07/15/2019 Chemotherapy   The patient had durvalumab (IMFINZI) 1,000 mg in sodium chloride 0.9 % 100 mL chemo infusion, 1,020 mg, Intravenous,  Once, 26 of 26 cycles Administration: 1,000 mg (07/31/2018), 1,000 mg (10/22/2018),  1,000 mg (11/05/2018), 1,000 mg (11/19/2018), 1,000 mg (12/03/2018), 1,000 mg (08/14/2018), 1,000 mg (12/17/2018), 1,000 mg (08/27/2018), 1,000 mg (09/10/2018), 1,000 mg (09/24/2018), 1,000 mg (10/11/2018), 1,000 mg (12/31/2018), 1,000 mg (01/14/2019), 1,120 mg (01/28/2019), 1,120 mg (02/11/2019), 1,120 mg (02/25/2019), 1,120 mg (03/11/2019), 1,120 mg (03/25/2019), 1,120 mg (04/08/2019), 1,120 mg (04/22/2019), 1,120 mg (05/06/2019), 1,120 mg (05/20/2019), 1,120 mg (06/03/2019), 1,120 mg (06/17/2019), 1,120 mg (07/01/2019), 1,120 mg (07/15/2019)  for chemotherapy treatment.    05/11/2020 - 05/13/2020 Chemotherapy   Received 1 cycle of Adriamycin and Cytoxan and discontinued it   11/17/2021 Cancer Staging   Staging form: Lung, AJCC 8th Edition - Clinical: Stage IIIB (cT3, cN2, cM0) - Signed by Si Gaul, MD on 11/17/2021   Malignant neoplasm of upper-outer quadrant of left breast in female, estrogen receptor negative (HCC)  02/19/2020 Cancer Staging   Staging form: Breast, AJCC 8th Edition - Clinical stage from 02/19/2020: Stage IIB (cT2, cN0, cM0, G3, ER-, PR-, HER2-) - Signed by Loa Socks, NP on 02/25/2020   02/19/2020 Initial Diagnosis   Chest CT for lung cancer follow-up showed a left breast module. Mammogram and US showed a 3.7cm mass with calcifications at the 2 o'clock position in the left breast, no left axillary adenopathy. Biopsy showed IDC, grade 3, HER-2 negative (0), ER/PR negative, Ki67 40%.   04/08/2020 Surgery   Left lumpectomy E[facility] Standing): three foci of IDC, grade 3, 3.3cm, 1.0cm, 0.7cm, with high grade DCIS, clear margins, 2 left axillary lymph nodes negative for carcinoma.    05/11/2020 - 05/13/2020 Chemotherapy   Received 1 cycle of Adriamycin and Cytoxan and discontinued it   10/18/2021 Relapse/Recurrence   Mammogram detected indeterminate left breast mass  at 12 o'clock position measuring 6 mm, 2 focal areas of calcifications (H 8 mm) within the left breast favored to be fat necrosis. The  6 mm mass on biopsy came back as grade 3 IDC ER 30% weak, PR 0%, HER2 negative, Ki-67 20%   08/16/2022 Surgery   Left lumpectomy: Grade 3 poorly differentiated carcinoma 2.4 cm with DCIS, margins negative, lymphovascular invasion not identified, ER 30%, PR 0%, HER2 1+ negative, Ki-67 20%     CHIEF COMPLIANT: Follow-up breast cancer    INTERVAL HISTORY: JENELL RAVITZ is a 67 y.o. with the above- mentioned breast cancer. She presents to the clinic for a follow-up. She reports that she is tolerating the anastrozole extremely well. She denies hot flashes and joint stiffness. She says muscles does aches, but when she move around it gets better. Denies any pain or discomfort in breast.   ALLERGIES:  has No Known Allergies.  MEDICATIONS:  Current Outpatient Medications  Medication Sig Dispense Refill   acetaminophen (TYLENOL) 325 MG tablet Take 650 mg by mouth every 6 (six) hours as needed for headache (pain).     albuterol (VENTOLIN HFA) 108 (90 Base) MCG/ACT inhaler Inhale 2 puffs into the lungs every 6 (six) hours as needed for wheezing or shortness of breath. 8 g 3   anastrozole (ARIMIDEX) 1 MG tablet Take 1 tablet (1 mg total) by mouth daily. 90 tablet 3   aspirin 81 MG chewable tablet Chew 1 tablet (81 mg total) by mouth daily. 30 tablet 1   Blood Glucose Monitoring Suppl DEVI 1 each by Does not apply route in the morning, at noon, and at bedtime. May substitute to any manufacturer covered by patient's insurance. 1 each 0   Budeson-Glycopyrrol-Formoterol (BREZTRI AEROSPHERE) 160-9-4.8 MCG/ACT AERO Inhale 2 puffs into the lungs daily. 5.9 g 5   Cholecalciferol (VITAMIN D3) 125 MCG (5000 UT) TABS Take 5,000 Units by mouth daily.     diltiazem (CARDIZEM CD) 240 MG 24 hr capsule TAKE 1 CAPSULE BY MOUTH EVERY DAY 90 capsule 2   furosemide (LASIX) 20 MG tablet TAKE 1 AND 1/2 TABLETS DAILY BY MOUTH 135 tablet 3   Glucose Blood (BLOOD GLUCOSE TEST STRIPS) STRP 1 each by In Vitro route in the  morning, at noon, and at bedtime. May substitute to any manufacturer covered by patient's insurance. 100 strip 0   ipratropium-albuterol (DUONEB) 0.5-2.5 (3) MG/3ML SOLN Take 3 mLs by nebulization every 3 (three) hours as needed. 360 mL 11   Lancet Device MISC 1 each by Does not apply route in the morning, at noon, and at bedtime. May substitute to any manufacturer covered by patient's insurance. 1 each 0   Lancets Misc. MISC 1 each by Does not apply route in the morning, at noon, and at bedtime. May substitute to any manufacturer covered by patient's insurance. 100 each 0   lidocaine-prilocaine (EMLA) cream APPLY TO AFFECTED AREA ONCE AS DIRECTED 30 g 3   montelukast (SINGULAIR) 10 MG tablet Take 1 tablet (10 mg total) by mouth at bedtime. For allergies 90 tablet 3   OXYGEN Inhale 6 L/min into the lungs continuous.     potassium chloride (KLOR-CON M) 10 MEQ tablet Take 1 tablet (10 mEq total) by mouth daily. 90 tablet 3   rosuvastatin (CRESTOR) 5 MG tablet TAKE 1 TABLET BY MOUTH DAILY. FOR CHOLESTEROL. 90 tablet 3   Semaglutide,0.25 or 0.5MG /DOS, (OZEMPIC, 0.25 OR 0.5 MG/DOSE,) 2 MG/3ML SOPN Inject 0.25 mg into the skin once weekly  for 4 weeks, then increase to 0.5 mg once weekly thereafter for diabetes 9 mL 0   Spacer/Aero-Holding Chambers (AEROCHAMBER MV) inhaler Use as instructed 1 each 0   No current facility-administered medications for this visit.    PHYSICAL EXAMINATION: ECOG PERFORMANCE STATUS: 1 - Symptomatic but completely ambulatory  Vitals:   12/08/22 0801  BP: 112/72  Pulse: (!) 113  Resp: 19  Temp: 98.1 F (36.7 C)  SpO2: 99%   Filed Weights   12/08/22 0801  Weight: 178 lb 5 oz (80.9 kg)    BREAST: No palpable masses or nodules in either right or left breasts. No palpable axillary supraclavicular or infraclavicular adenopathy no breast tenderness or nipple discharge. (exam performed in the presence of a chaperone)  LABORATORY DATA:  I have reviewed the data as  listed    Latest Ref Rng & Units 11/17/2022    9:56 AM 08/08/2022    3:46 PM 05/16/2022    2:03 PM  CMP  Glucose 70 - 99 mg/dL 84  161  096   BUN 8 - 23 mg/dL 14  13  13    Creatinine 0.44 - 1.00 mg/dL 0.45  4.09  8.11   Sodium 135 - 145 mmol/L 140  137  139   Potassium 3.5 - 5.1 mmol/L 3.9  4.0  4.1   Chloride 98 - 111 mmol/L 102  99  99   CO2 22 - 32 mmol/L 32  29  32   Calcium 8.9 - 10.3 mg/dL 9.4  9.2  9.1   Total Protein 6.5 - 8.1 g/dL 7.5   7.8   Total Bilirubin 0.3 - 1.2 mg/dL 0.2   0.3   Alkaline Phos 38 - 126 U/L 66   67   AST 15 - 41 U/L 11   14   ALT 0 - 44 U/L 10   10     Lab Results  Component Value Date   WBC 10.9 (H) 11/17/2022   HGB 11.3 (L) 11/17/2022   HCT 36.0 11/17/2022   MCV 81.1 11/17/2022   PLT 319 11/17/2022   NEUTROABS 8.2 (H) 11/17/2022    ASSESSMENT & PLAN:  Malignant neoplasm of upper-outer quadrant of left breast in female, estrogen receptor negative (HCC) 04/08/20: Left lumpectomy [facility] Standing): three foci of IDC, grade 3, 3.3cm, 1.0cm, 0.7cm, with high grade DCIS, clear margins, 2 left axillary lymph nodes negative for carcinoma. HER-2 negative (0), ER/PR negative, Ki67 40%.     Treatment Plan: 1. Adjuvant chemotherapy with Adriamycin and Cytoxan dose dense 4 followed by Taxol weekly 12 (discontinued after cycle 1 because of profound fatigue and generalized failure to thrive along with hypoxia and tachycardia) the first cycle was even given at a low dose 2. Followed by adjuvant radiation therapy (patient does not want to receive radiation)   Severe COPD: 24-hour oxygen -------------------------------------------------------------------------------------------------------------------------------------------- Hospitalization: GI bleed: 4 units of PRBC given November 2021   Relapse: Mammogram detected indeterminate left breast mass at 12 o'clock position measuring 6 mm, 2 focal areas of calcifications (H 8 mm) within the left breast favored to be fat  necrosis. The 6 mm mass on biopsy came back as grade 3 IDC ER 30% weak, PR 0%, HER2 negative, Ki-67 20% Because she was high risk medical management was performed with Faslodex injections   08/16/2022:Left lumpectomy: Grade 3 poorly differentiated carcinoma 2.4 cm with DCIS, margins negative, lymphovascular invasion not identified, ER 30%, PR 0%, HER2 1+ negative, Ki-67 20%  Treatment plan: Antiestrogen therapy with  anastrozole Lung cancer: Under care of Dr. Arbutus Ped Return to clinic in 1 year for follow-up.   No orders of the defined types were placed in this encounter.  The patient has a good understanding of the overall plan. she agrees with it. she will call with any problems that may develop before the next visit here. Total time spent: 30 mins including face to face time and time spent for planning, charting and co-ordination of care   Tamsen Meek, MD 12/08/22    I Janan Ridge am acting as a Neurosurgeon for [employer]  I have reviewed the above documentation for accuracy and completeness, and I agree with the above.

## 2022-12-08 ENCOUNTER — Inpatient Hospital Stay: Payer: BC Managed Care – PPO | Admitting: Hematology and Oncology

## 2022-12-08 VITALS — BP 112/72 | HR 113 | Temp 98.1°F | Resp 19 | Ht 63.0 in | Wt 178.3 lb

## 2022-12-08 DIAGNOSIS — Z85118 Personal history of other malignant neoplasm of bronchus and lung: Secondary | ICD-10-CM | POA: Diagnosis not present

## 2022-12-08 DIAGNOSIS — Z171 Estrogen receptor negative status [ER-]: Secondary | ICD-10-CM | POA: Diagnosis not present

## 2022-12-08 DIAGNOSIS — Z79811 Long term (current) use of aromatase inhibitors: Secondary | ICD-10-CM | POA: Diagnosis not present

## 2022-12-08 DIAGNOSIS — C50412 Malignant neoplasm of upper-outer quadrant of left female breast: Secondary | ICD-10-CM | POA: Diagnosis not present

## 2022-12-08 NOTE — Assessment & Plan Note (Signed)
04/08/20: Left lumpectomy [facility]): three foci of IDC, grade 3, 3.3cm, 1.0cm, 0.7cm, with high grade DCIS, clear margins, 2 left axillary lymph nodes negative for carcinoma. HER-2 negative (0), ER/PR negative, Ki67 40%.     Treatment Plan: 1. Adjuvant chemotherapy with Adriamycin and Cytoxan dose dense 4 followed by Taxol weekly 12 (discontinued after cycle 1 because of profound fatigue and generalized failure to thrive along with hypoxia and tachycardia) the first cycle was even given at a low dose 2. Followed by adjuvant radiation therapy (patient does not want to receive radiation)   Severe COPD: 24-hour oxygen -------------------------------------------------------------------------------------------------------------------------------------------- Hospitalization: GI bleed: 4 units of PRBC given November 2021   Relapse: Mammogram detected indeterminate left breast mass at 12 o'clock position measuring 6 mm, 2 focal areas of calcifications (H 8 mm) within the left breast favored to be fat necrosis. The 6 mm mass on biopsy came back as grade 3 IDC ER 30% weak, PR 0%, HER2 negative, Ki-67 20% Because she was high risk medical management was performed with Faslodex injections   08/16/2022:Left lumpectomy: Grade 3 poorly differentiated carcinoma 2.4 cm with DCIS, margins negative, lymphovascular invasion not identified, ER 30%, PR 0%, HER2 1+ negative, Ki-67 20%  Treatment plan: Antiestrogen therapy with anastrozole Lung cancer: Under care of Dr. Arbutus Ped

## 2022-12-12 ENCOUNTER — Telehealth: Payer: Self-pay | Admitting: Hematology and Oncology

## 2022-12-12 NOTE — Telephone Encounter (Signed)
Scheduled appointment per 5/24 los. Left voicemail.

## 2022-12-20 ENCOUNTER — Inpatient Hospital Stay: Payer: BC Managed Care – PPO | Attending: Internal Medicine

## 2022-12-20 ENCOUNTER — Ambulatory Visit
Admission: RE | Admit: 2022-12-20 | Discharge: 2022-12-20 | Disposition: A | Discharge status: Home / Self Care | Payer: BC Managed Care – PPO | Source: Ambulatory Visit | Attending: Hematology and Oncology | Admitting: Hematology and Oncology

## 2022-12-20 DIAGNOSIS — C3490 Malignant neoplasm of unspecified part of unspecified bronchus or lung: Secondary | ICD-10-CM

## 2022-12-20 DIAGNOSIS — Z170 Estrogen receptor positive status [ER+]: Secondary | ICD-10-CM | POA: Insufficient documentation

## 2022-12-20 DIAGNOSIS — C50412 Malignant neoplasm of upper-outer quadrant of left female breast: Secondary | ICD-10-CM | POA: Insufficient documentation

## 2022-12-20 DIAGNOSIS — Z452 Encounter for adjustment and management of vascular access device: Secondary | ICD-10-CM | POA: Insufficient documentation

## 2022-12-20 DIAGNOSIS — Z853 Personal history of malignant neoplasm of breast: Secondary | ICD-10-CM | POA: Diagnosis not present

## 2022-12-22 ENCOUNTER — Encounter: Payer: Self-pay | Admitting: Adult Health

## 2022-12-24 ENCOUNTER — Other Ambulatory Visit: Payer: Self-pay | Admitting: Pulmonary Disease

## 2022-12-25 ENCOUNTER — Other Ambulatory Visit: Payer: Self-pay

## 2022-12-25 ENCOUNTER — Inpatient Hospital Stay: Payer: BC Managed Care – PPO

## 2022-12-25 DIAGNOSIS — Z452 Encounter for adjustment and management of vascular access device: Secondary | ICD-10-CM | POA: Diagnosis not present

## 2022-12-25 DIAGNOSIS — Z95828 Presence of other vascular implants and grafts: Secondary | ICD-10-CM

## 2022-12-25 DIAGNOSIS — C50412 Malignant neoplasm of upper-outer quadrant of left female breast: Secondary | ICD-10-CM | POA: Diagnosis not present

## 2022-12-25 DIAGNOSIS — Z170 Estrogen receptor positive status [ER+]: Secondary | ICD-10-CM | POA: Diagnosis not present

## 2022-12-25 MED ORDER — SODIUM CHLORIDE 0.9% FLUSH
10.0000 mL | Freq: Once | INTRAVENOUS | Status: AC
  Administered 2022-12-25: 10 mL

## 2022-12-25 MED ORDER — HEPARIN SOD (PORK) LOCK FLUSH 100 UNIT/ML IV SOLN
500.0000 [IU] | Freq: Once | INTRAVENOUS | Status: AC
  Administered 2022-12-25: 500 [IU]

## 2023-01-12 ENCOUNTER — Encounter: Payer: Self-pay | Admitting: Pulmonary Disease

## 2023-01-12 ENCOUNTER — Ambulatory Visit (INDEPENDENT_AMBULATORY_CARE_PROVIDER_SITE_OTHER): Payer: BC Managed Care – PPO | Admitting: Pulmonary Disease

## 2023-01-12 VITALS — BP 114/60 | HR 119 | Temp 98.2°F | Ht 63.0 in | Wt 170.0 lb

## 2023-01-12 DIAGNOSIS — R0609 Other forms of dyspnea: Secondary | ICD-10-CM | POA: Diagnosis not present

## 2023-01-12 MED ORDER — BREZTRI AEROSPHERE 160-9-4.8 MCG/ACT IN AERO: 2.0000 | INHALATION_SPRAY | Freq: Two times a day (BID) | RESPIRATORY_TRACT | 11 refills | Status: DC

## 2023-01-12 NOTE — Patient Instructions (Addendum)
Nice to see you again  [employer] as you are, I provided enough refills for a year today  I sent a referral to pulmonary rehabilitation and see if we can work on your strength and stamina, they have moved locations so hopefully be easier to get in and out and will not be a hindrance  Return to clinic in 6 months or sooner as needed with Dr. Judeth Horn

## 2023-01-12 NOTE — Progress Notes (Signed)
@Patient  ID: Deborah [name], female    DOB: May 07, 1956, 67 y.o.   MRN: [id number]  Chief Complaint  Patient presents with   Follow-up    Breathing is overall doing well. She has occ cough with clear sputum. She is using her albuterol inhaler rarely.     Referring provider: Doreene Nest, NP  HPI:    01/12/2023  - Visit  Returns for routine follow-up.  Reports ongoing good adherence to Breztri 2 puff twice daily.  Continues to see improvement with this.  Underwent lumpectomy in the meantime, DCIS.  On hormone therapy, able to avoid chemotherapy which is great.  Oxygen requirement is stable, 2 L at rest, 4 L with exertion she drops and needs more.  She would like to work on stamina getting stronger.  Moving more.  Discussed pulmonary rehab, with new location may be easier to get in and out of.  She is amenable to referral today.  Cardiac  Questionaires / Pulmonary Flowsheets:   ACT:      No data to display           MMRC: mMRC Dyspnea Scale mMRC Score  02/23/2020  5:13 PM 3    Epworth:      No data to display           Tests:   FENO:  No results found for: "NITRICOXIDE"  PFT:    Latest Ref Rng & Units 02/18/2020    4:24 PM 01/01/2017    8:40 AM 06/05/2013    9:00 AM  PFT Results  FVC-Pre L 1.29  1.76    FVC-Predicted Pre % 40  54  54      FVC-Post L 1.33  1.88  1.96      FVC-Predicted Post % 42  58  59      Pre FEV1/FVC % % 74  75  82      Post FEV1/FCV % % 75  80  81      FEV1-Pre L 0.95  1.32  1.47      FEV1-Predicted Pre % 38  52  56      FEV1-Post L 1.00  1.50  1.58      DLCO uncorrected ml/min/mmHg 11.41  14.89  13.94      DLCO UNC% % 58  64  60      DLCO corrected ml/min/mmHg 11.41  14.98    DLCO COR %Predicted % 58  65    DLVA Predicted % 108  92  96      TLC L  4.16  4.13      TLC % Predicted %  84  84      RV % Predicted %  118  111         This result is from an external source.   Personally reviewed and interpreted as severe  fixed obstruction, DLCO moderately reduced 02/2020 similar findings 12/2016 on my review and interpretation with addition of lung volumes demonstrating normal TLC, borderline but no evidence of air trapping, subsequent worsening FEV1 2021 to compare to 2023.  WALK:     04/20/2016    4:38 PM 03/24/2015    5:11 PM 08/26/2014   11:03 AM 04/17/2013    9:21 AM  SIX MIN WALK  Supplimental Oxygen during Test? (L/min)  No Yes No  O2 Flow Rate   2 L/min   Type   Continuous   2 Minute Oxygen Saturation % 93 %  2 Minute HR 125     4 Minute Oxygen Saturation % 93 %     4 Minute HR 132     6 Minute Oxygen Saturation % 92 %     6 Minute HR 154     Tech Comments:  stopped after 2nd lap due to fatigue, sob Pt had c/o sob after completion of 1st lap. Pt was able to complete 3 laps on 2 L 02.     Imaging: MM DIAG BREAST TOMO BILATERAL  Result Date: 12/20/2022 CLINICAL DATA:  67 year old female for annual follow-up. History of LEFT breast cancer and lumpectomy in 2021 and LEFT breast cancer recurrence and lumpectomy in 2024. EXAM: DIGITAL DIAGNOSTIC BILATERAL MAMMOGRAM WITH TOMOSYNTHESIS TECHNIQUE: Bilateral digital diagnostic mammography and breast tomosynthesis was performed. COMPARISON:  Previous exam(s). ACR Breast Density Category b: There are scattered areas of fibroglandular density. FINDINGS: Full field views of both breasts demonstrate surgical changes within the LEFT breast. No suspicious mass, nonsurgical distortion or worrisome calcifications are noted within either breast. IMPRESSION: 1. No evidence of breast malignancy. 2. LEFT lumpectomy changes. RECOMMENDATION: Bilateral diagnostic mammogram in 1 year. I have discussed the findings and recommendations with the patient. If applicable, a reminder letter will be sent to the patient regarding the next appointment. BI-RADS CATEGORY  2: Benign. Electronically Signed   By: Harmon Pier M.D.   On: 12/20/2022 11:11    Lab Results: Personally  reviewed CBC    Component Value Date/Time   WBC 10.9 (H) 11/17/2022 0956   WBC 11.7 (H) 08/08/2022 1546   RBC 4.44 11/17/2022 0956   HGB 11.3 (L) 11/17/2022 0956   HCT 36.0 11/17/2022 0956   PLT 319 11/17/2022 0956   MCV 81.1 11/17/2022 0956   MCH 25.5 (L) 11/17/2022 0956   MCHC 31.4 11/17/2022 0956   RDW 15.5 11/17/2022 0956   LYMPHSABS 1.4 11/17/2022 0956   MONOABS 1.0 11/17/2022 0956   EOSABS 0.2 11/17/2022 0956   BASOSABS 0.1 11/17/2022 0956    BMET    Component Value Date/Time   NA 140 11/17/2022 0956   NA 143 11/23/2016 1504   K 3.9 11/17/2022 0956   CL 102 11/17/2022 0956   CO2 32 11/17/2022 0956   GLUCOSE 84 11/17/2022 0956   BUN 14 11/17/2022 0956   BUN 14 11/23/2016 1504   CREATININE 0.60 11/17/2022 0956   CALCIUM 9.4 11/17/2022 0956   GFRNONAA >60 11/17/2022 0956   GFRAA >60 04/02/2020 1400   GFRAA >60 01/26/2020 1019    BNP    Component Value Date/Time   BNP 280.0 (H) 05/26/2020 0505    ProBNP No results found for: "PROBNP"  Specialty Problems       Pulmonary Problems   Chronic respiratory failure (HCC)    Followed in Pulmonary clinic/ [facility]/ [facility] - 04/17/2013  Walked RA x 1 laps @ 185 ft each stopped due to  desat 86% rapid pace  From resting sat 94%      Dyspnea    Followed in Pulmonary clinic/ [facility]/ [facility] - hfa 75% p coaching 04/17/2013  - PFT's 06/05/2013 no airflow obst, dlco 60 corrects to 96%      CRLD (chronic restrictive lung disease) secondary to super morbid obesity with mild COPD component   OSA (obstructive sleep apnea)    Auto CPAP 5-10  2 L oxygen blended      COPD exacerbation (HCC)   COPD (chronic obstructive pulmonary disease) (HCC)   Primary malignant neoplasm of  bronchus of left lower lobe (HCC)   Acute on chronic respiratory failure with hypoxia (HCC)   Acute hypoxemic respiratory failure (HCC)    No Known Allergies  Immunization History  Administered Date(s) Administered    Influenza Inj Mdck Quad Pf 04/22/2022   Influenza,inj,Quad PF,6+ Mos 04/09/2013, 07/27/2014, 04/18/2016, 05/28/2017, 04/03/2018, 04/08/2019, 04/22/2021   PFIZER(Purple Top)SARS-COV-2 Vaccination 09/28/2019, 10/18/2019   Pfizer Covid-19 Vaccine Bivalent Booster 44yrs & up 04/22/2021   Pneumococcal Conjugate-13 01/01/2017   Pneumococcal Polysaccharide-23 07/27/2014   Tdap 11/12/2017   Zoster Recombinat (Shingrix) 11/12/2019, 01/14/2020    Past Medical History:  Diagnosis Date   Anemia    as teen   Asthma    Breast cancer (HCC)    left breast cancer 02/2020   CHF (congestive heart failure) (HCC)    COPD, mild (HCC)    Depression    Diabetes mellitus without complication (HCC)    Diverticulitis    Family history of anesthesia complication    vomiting   GI bleeding    Heart failure (HCC)    New onset 07/25/14   Histoplasmosis    left eye   Hyperkalemia    Hypertension    Lung cancer (HCC) 05/02/2018   s/p chemoradiation, immunotherapy   Obesity (BMI 30-39.9)    Pneumonia    dx wtih pneumonia on 05/27/16- seen by [facility]    PONV (postoperative nausea and vomiting)    Restrictive lung disease    Shortness of breath    with exertion    Sleep apnea    mask and oxygen at nite for sleep at 2L    Tobacco abuse    Umbilical hernia     Tobacco History: Social History   Tobacco Use  Smoking Status Former   Packs/day: 3.00   Years: 43.00   Additional pack years: 0.00   Total pack years: 129.00   Types: Cigarettes   Quit date: 12/17/2012   Years since quitting: 10.0  Smokeless Tobacco Never   Counseling given: Not Answered   Continue to not smoke  Outpatient Encounter Medications as of 01/12/2023  Medication Sig   acetaminophen (TYLENOL) 325 MG tablet Take 650 mg by mouth every 6 (six) hours as needed for headache (pain).   albuterol (VENTOLIN HFA) 108 (90 Base) MCG/ACT inhaler Inhale 2 puffs into the lungs every 6 (six) hours as needed for wheezing or shortness of  breath.   anastrozole (ARIMIDEX) 1 MG tablet Take 1 tablet (1 mg total) by mouth daily.   aspirin 81 MG chewable tablet Chew 1 tablet (81 mg total) by mouth daily.   Blood Glucose Monitoring Suppl DEVI 1 each by Does not apply route in the morning, at noon, and at bedtime. May substitute to any manufacturer covered by patient's insurance.   Cholecalciferol (VITAMIN D3) 125 MCG (5000 UT) TABS Take 5,000 Units by mouth daily.   diltiazem (CARDIZEM CD) 240 MG 24 hr capsule TAKE 1 CAPSULE BY MOUTH EVERY DAY   furosemide (LASIX) 20 MG tablet TAKE 1 AND 1/2 TABLETS DAILY BY MOUTH   ipratropium-albuterol (DUONEB) 0.5-2.5 (3) MG/3ML SOLN Take 3 mLs by nebulization every 3 (three) hours as needed.   lidocaine-prilocaine (EMLA) cream APPLY TO AFFECTED AREA ONCE AS DIRECTED   montelukast (SINGULAIR) 10 MG tablet Take 1 tablet (10 mg total) by mouth at bedtime. For allergies   OXYGEN Inhale 6 L/min into the lungs continuous.   potassium chloride (KLOR-CON M) 10 MEQ tablet Take 1 tablet (10 mEq total) by  mouth daily.   rosuvastatin (CRESTOR) 5 MG tablet TAKE 1 TABLET BY MOUTH DAILY. FOR CHOLESTEROL.   Semaglutide,0.25 or 0.5MG /DOS, (OZEMPIC, 0.25 OR 0.5 MG/DOSE,) 2 MG/3ML SOPN Inject 0.25 mg into the skin once weekly for 4 weeks, then increase to 0.5 mg once weekly thereafter for diabetes   Spacer/Aero-Holding Chambers (AEROCHAMBER MV) inhaler Use as instructed   [DISCONTINUED] Budeson-Glycopyrrol-Formoterol (BREZTRI AEROSPHERE) 160-9-4.8 MCG/ACT AERO INHALE 2 PUFFS BY MOUTH INTO THE LUNGS DAILY   Budeson-Glycopyrrol-Formoterol (BREZTRI AEROSPHERE) 160-9-4.8 MCG/ACT AERO Inhale 2 puffs into the lungs 2 (two) times daily.   [DISCONTINUED] prochlorperazine (COMPAZINE) 10 MG tablet Take 1 tablet (10 mg total) by mouth every 6 (six) hours as needed (Nausea or vomiting).   No facility-administered encounter medications on file as of 01/12/2023.     Review of Systems  Review of Systems  N/A Physical  Exam  BP 114/60 (BP Location: Left Arm, Cuff Size: Normal)   Pulse (!) 119   Temp 98.2 F (36.8 C) (Oral)   Ht 5\' 3"  (1.6 m)   Wt 170 lb (77.1 kg)   SpO2 99%   BMI 30.11 kg/m   Wt Readings from Last 5 Encounters:  01/12/23 170 lb (77.1 kg)  12/08/22 178 lb 5 oz (80.9 kg)  11/28/22 181 lb (82.1 kg)  11/20/22 179 lb 14.4 oz (81.6 kg)  08/30/22 174 lb 3.2 oz (79 kg)    BMI Readings from Last 5 Encounters:  01/12/23 30.11 kg/m  12/08/22 31.59 kg/m  11/28/22 32.06 kg/m  11/20/22 31.87 kg/m  08/30/22 30.86 kg/m     Physical Exam General: Chronically ill-appearing, in wheelchair, no acute distress Neck: Habitus precludes JVP evaluation, supple Eyes: EOMI, no icterus Respiratory: Distant breath sounds, clear to Abdomen: Obese, bowel sounds present MSK: No synovitis, no joint effusion Neuro: No weakness, no sensation deficit Psych: Normal mood, full affect   Assessment & Plan:   Severe COPD: PFT 2021 with FEV1 just over 30% after bronchodilator.  No significant bronchodilator response.  Not using inhaler medicine, maintenance inhaler.  Symptoms improved with Breztri 2 puffs daily-advised to continue, if dyspnea were to worsen increase to twice daily.  Medication refilled today.  Referral to pulmonary rehab today.  Regarding the ultrasound.  You  Chronic hypoxic respiratory failure: Most likely related to severe COPD.  Has some cardiovascular issues as well.  To continue 4 L with exertion as prescribed, does okay on 2 L at rest.     Return in about 6 months (around 07/14/2023).   Karren Burly, MD 01/12/2023

## 2023-01-15 ENCOUNTER — Other Ambulatory Visit: Payer: Self-pay | Admitting: Primary Care

## 2023-01-15 DIAGNOSIS — E1165 Type 2 diabetes mellitus with hyperglycemia: Secondary | ICD-10-CM

## 2023-01-24 ENCOUNTER — Other Ambulatory Visit: Payer: Self-pay | Admitting: Primary Care

## 2023-01-24 DIAGNOSIS — I700 Atherosclerosis of aorta: Secondary | ICD-10-CM

## 2023-01-24 DIAGNOSIS — I5032 Chronic diastolic (congestive) heart failure: Secondary | ICD-10-CM

## 2023-01-24 DIAGNOSIS — E785 Hyperlipidemia, unspecified: Secondary | ICD-10-CM

## 2023-01-24 DIAGNOSIS — J984 Other disorders of lung: Secondary | ICD-10-CM

## 2023-01-25 ENCOUNTER — Other Ambulatory Visit: Payer: Self-pay | Admitting: Cardiovascular Disease

## 2023-01-25 ENCOUNTER — Other Ambulatory Visit: Payer: Self-pay | Admitting: Primary Care

## 2023-01-25 DIAGNOSIS — E1165 Type 2 diabetes mellitus with hyperglycemia: Secondary | ICD-10-CM

## 2023-01-25 NOTE — Telephone Encounter (Signed)
See Pharmacy comment: Product Backordered/Unavailable.   Please advise

## 2023-01-29 ENCOUNTER — Encounter (HOSPITAL_COMMUNITY): Payer: Self-pay

## 2023-01-29 ENCOUNTER — Telehealth (HOSPITAL_COMMUNITY): Payer: Self-pay

## 2023-01-29 NOTE — Telephone Encounter (Signed)
Pt insurance is active and benefits verified through [employer] Co-pay 0, DED $3,000/$3,000 met, out of pocket $8,700/$8,700 met, co-insurance 30%. no pre-authorization required, Jesse/[employer] 01/29/23@2 :39, REF# [id number]   NO LIMITS FOR PULMONARY REHAB

## 2023-01-31 ENCOUNTER — Ambulatory Visit (HOSPITAL_COMMUNITY): Payer: BC Managed Care – PPO

## 2023-02-01 ENCOUNTER — Telehealth (HOSPITAL_COMMUNITY): Payer: Self-pay

## 2023-02-01 NOTE — Telephone Encounter (Signed)
Called pt to confirm PR orientation appointment for tomorrow and pt states she has been vomiting all day. She will not be able to attend orientation tomorrow. We will reschedule.

## 2023-02-02 ENCOUNTER — Ambulatory Visit (HOSPITAL_COMMUNITY): Payer: BC Managed Care – PPO

## 2023-02-06 ENCOUNTER — Telehealth: Payer: Self-pay | Admitting: Cardiovascular Disease

## 2023-02-06 ENCOUNTER — Telehealth: Payer: Self-pay | Admitting: *Deleted

## 2023-02-06 NOTE — Telephone Encounter (Signed)
  Patient Consent for Virtual Visit         Deborah [name] has provided verbal consent on 02/06/2023 for a virtual visit (video or telephone).   CONSENT FOR VIRTUAL VISIT FOR:  Deborah [name]  By participating in this virtual visit I agree to the following:  I hereby voluntarily request, consent and authorize [facility] and its employed or contracted physicians, physician assistants, nurse practitioners or other licensed health care professionals (the Practitioner), to provide me with telemedicine health care services (the "Services") as deemed necessary by the treating Practitioner. I acknowledge and consent to receive the Services by the Practitioner via telemedicine. I understand that the telemedicine visit will involve communicating with the Practitioner through live audiovisual communication technology and the disclosure of certain medical information by electronic transmission. I acknowledge that I have been given the opportunity to request an in-person assessment or other available alternative prior to the telemedicine visit and am voluntarily participating in the telemedicine visit.  I understand that I have the right to withhold or withdraw my consent to the use of telemedicine in the course of my care at any time, without affecting my right to future care or treatment, and that the Practitioner or I may terminate the telemedicine visit at any time. I understand that I have the right to inspect all information obtained and/or recorded in the course of the telemedicine visit and may receive copies of available information for a reasonable fee.  I understand that some of the potential risks of receiving the Services via telemedicine include:  Delay or interruption in medical evaluation due to technological equipment failure or disruption; Information transmitted may not be sufficient (e.g. poor resolution of images) to allow for appropriate medical decision making by the  Practitioner; and/or  In rare instances, security protocols could fail, causing a breach of personal health information.  Furthermore, I acknowledge that it is my responsibility to provide information about my medical history, conditions and care that is complete and accurate to the best of my ability. I acknowledge that Practitioner's advice, recommendations, and/or decision may be based on factors not within their control, such as incomplete or inaccurate data provided by me or distortions of diagnostic images or specimens that may result from electronic transmissions. I understand that the practice of medicine is not an exact science and that Practitioner makes no warranties or guarantees regarding treatment outcomes. I acknowledge that a copy of this consent can be made available to me via my patient portal [facility] MyChart), or I can request a printed copy by calling the office of  [facility].    I understand that my insurance will be billed for this visit.   I have read or had this consent read to me. I understand the contents of this consent, which adequately explains the benefits and risks of the Services being provided via telemedicine.  I have been provided ample opportunity to ask questions regarding this consent and the Services and have had my questions answered to my satisfaction. I give my informed consent for the services to be provided through the use of telemedicine in my medical care

## 2023-02-06 NOTE — Telephone Encounter (Signed)
   Pre-operative Risk Assessment    Patient Name: Deborah [name]  DOB: [birth year] MRN: [id number]     Request for Surgical Clearance    Procedure:  Dental Extraction - Amount of Teeth to be Pulled:  16  Date of Surgery:  Clearance 02/14/23                                 Surgeon:  Dr. Lorayne Bender, DDS Surgeon's Group or Practice Name:  [facility] Phone number:  [phone] Fax number:  [phone]   Type of Clearance Requested:   - Medical    Type of Anesthesia:   Articaine, Lidocaine, Marcaine, Carbocaine   Additional requests/questions:  Please advise surgeon/provider what medications should be held.  [employer]   02/06/2023, 11:26 AM

## 2023-02-06 NOTE — Telephone Encounter (Signed)
   S/w pt is aware of upcoming tele appt. Med rec and consent done.  Will remove from preop pool.   Patient Consent for Virtual Visit     Deborah [name] has provided verbal consent on 02/06/2023 for a virtual visit (video or telephone).   CONSENT FOR VIRTUAL VISIT FOR:  Deborah [name]  By participating in this virtual visit I agree to the following:  I hereby voluntarily request, consent and authorize [facility] and its employed or contracted physicians, physician assistants, nurse practitioners or other licensed health care professionals (the Practitioner), to provide me with telemedicine health care services (the "Services") as deemed necessary by the treating Practitioner. I acknowledge and consent to receive the Services by the Practitioner via telemedicine. I understand that the telemedicine visit will involve communicating with the Practitioner through live audiovisual communication technology and the disclosure of certain medical information by electronic transmission. I acknowledge that I have been given the opportunity to request an in-person assessment or other available alternative prior to the telemedicine visit and am voluntarily participating in the telemedicine visit.  I understand that I have the right to withhold or withdraw my consent to the use of telemedicine in the course of my care at any time, without affecting my right to future care or treatment, and that the Practitioner or I may terminate the telemedicine visit at any time. I understand that I have the right to inspect all information obtained and/or recorded in the course of the telemedicine visit and may receive copies of available information for a reasonable fee.  I understand that some of the potential risks of receiving the Services via telemedicine include:  Delay or interruption in medical evaluation due to technological equipment failure or disruption; Information transmitted may not be sufficient  (e.g. poor resolution of images) to allow for appropriate medical decision making by the Practitioner; and/or  In rare instances, security protocols could fail, causing a breach of personal health information.  Furthermore, I acknowledge that it is my responsibility to provide information about my medical history, conditions and care that is complete and accurate to the best of my ability. I acknowledge that Practitioner's advice, recommendations, and/or decision may be based on factors not within their control, such as incomplete or inaccurate data provided by me or distortions of diagnostic images or specimens that may result from electronic transmissions. I understand that the practice of medicine is not an exact science and that Practitioner makes no warranties or guarantees regarding treatment outcomes. I acknowledge that a copy of this consent can be made available to me via my patient portal [facility] MyChart), or I can request a printed copy by calling the office of [facility].    I understand that my insurance will be billed for this visit.   I have read or had this consent read to me. I understand the contents of this consent, which adequately explains the benefits and risks of the Services being provided via telemedicine.  I have been provided ample opportunity to ask questions regarding this consent and the Services and have had my questions answered to my satisfaction. I give my informed consent for the services to be provided through the use of telemedicine in my medical care

## 2023-02-06 NOTE — Telephone Encounter (Signed)
   Name: Deborah [name]  DOB: September 01, 1955  MRN: [id number]  Primary Cardiologist: Lorine Bears, MD   Preoperative team, please contact this patient and set up a phone call appointment for further preoperative risk assessment. Please obtain consent and complete medication review. Thank you for your help.  I confirm that guidance regarding antiplatelet and oral anticoagulation therapy has been completed and, if necessary, noted below.  Per office protocol, if patient is without any new symptoms or concerns at the time of their virtual visit, she may hold Aspirin for 5-7 days prior to procedure. Please resume Aspirin as soon as possible postprocedure, at the discretion of the surgeon.    Joylene Grapes, NP 02/06/2023, 11:38 AM [facility]

## 2023-02-08 ENCOUNTER — Ambulatory Visit (HOSPITAL_COMMUNITY): Payer: BC Managed Care – PPO

## 2023-02-08 NOTE — Progress Notes (Signed)
Virtual Visit via Telephone Note   Because of Deborah [name]'s co-morbid illnesses, she is at least at moderate risk for complications without adequate follow up.  This format is felt to be most appropriate for this patient at this time.  The patient did not have access to video technology/had technical difficulties with video requiring transitioning to audio format only (telephone).  All issues noted in this document were discussed and addressed.  No physical exam could be performed with this format.  Please refer to the patient's chart for her consent to telehealth for [employer].  Evaluation Performed:  Preoperative cardiovascular risk assessment _____________   Date:  02/09/2023   Patient ID:  Deborah [name], DOB 26-Dec-1955, MRN [id number] Patient Location:  Home Provider location:   Office  Primary Care Provider:  Doreene Nest, NP Primary Cardiologist:  Lorine Bears, MD  Chief Complaint / Patient Profile   67 y.o. y/o female with a h/o chronic diastolic heart failure, chronic right heart failure due to COPD, remote tobacco abuse, oxygen dependent COPD, hypertension, obesity, bipolar disorder, squamous cell carcinoma of the lung stage IIIb in January 2019, treated with radiation and chemo and immunotherapy, breast cancer followed by oncology, most recent echocardiogram 05/25/2020 with EF of 55% to 60%   She is pending dental extraction removal of 16 teeth by Dr. Lorayne Bender, DDS on 02/14/2023 and presents today for telephonic preoperative cardiovascular risk assessment.  Recommendations for stopping aspirin therapy.  History of Present Illness    Deborah [name] is a 67 y.o. female who presents via audio/video conferencing for a telehealth visit today.  Pt was last seen in cardiology clinic on 07/25/2022 by Cadence [place], [place].  At that time Deborah [name] was doing well and stable from a cardiac standpoint..  The patient is now pending procedure as outlined  above. Since her last visit, she has done well from cardiac standpoint. COPD is limiting her activities.   Past Medical History    Past Medical History:  Diagnosis Date   Anemia    as teen   Asthma    Breast cancer (HCC)    left breast cancer 02/2020   CHF (congestive heart failure) (HCC)    COPD, mild (HCC)    Depression    Diabetes mellitus without complication (HCC)    Diverticulitis    Family history of anesthesia complication    vomiting   GI bleeding    Heart failure (HCC)    New onset 07/25/14   Histoplasmosis    left eye   Hyperkalemia    Hypertension    Lung cancer (HCC) 05/02/2018   s/p chemoradiation, immunotherapy   Obesity (BMI 30-39.9)    Pneumonia    dx wtih pneumonia on 05/27/16- seen by [facility]    PONV (postoperative nausea and vomiting)    Restrictive lung disease    Shortness of breath    with exertion    Sleep apnea    mask and oxygen at nite for sleep at 2L    Tobacco abuse    Umbilical hernia    Past Surgical History:  Procedure Laterality Date   APPLICATION OF WOUND VAC  03/19/2013   Procedure: APPLICATION OF WOUND VAC;  Surgeon: Lodema Pilot, DO;  Location: [facility];  Service: General;;   BREAST BIOPSY Left 09/29/2021   BREAST BIOPSY Left 10/18/2021   BREAST LUMPECTOMY Left 04/08/2020   BREAST LUMPECTOMY Left 08/16/2022   Procedure: LEFT BREAST LUMPECTOMY;  Surgeon:  Harriette Bouillon, MD;  Location: [facility];  Service: General;  Laterality: Left;   BREAST LUMPECTOMY WITH RADIOACTIVE SEED AND SENTINEL LYMPH NODE BIOPSY Left 04/08/2020   Procedure: LEFT BREAST LUMPECTOMY WITH RADIOACTIVE SEED AND LEFT AXILLARY SENTINEL LYMPH NODE BIOPSY;  Surgeon: Ovidio Kin, MD;  Location: [facility];  Service: General;  Laterality: Left;  PEC BLOCK, NEEDS ANESTHESIA CONSULT   CESAREAN SECTION  04/12/1985   COLONOSCOPY WITH PROPOFOL N/A 06/13/2016   Procedure: COLONOSCOPY WITH PROPOFOL;  Surgeon: Beverley Fiedler, MD;  Location: [facility];  Service: Gastroenterology;   Laterality: N/A;   ERCP N/A 02/15/2017   Procedure: ENDOSCOPIC RETROGRADE CHOLANGIOPANCREATOGRAPHY (ERCP);  Surgeon: Rachael Fee, MD;  Location: [facility];  Service: Endoscopy;  Laterality: N/A;   EYE SURGERY Left    INSERTION OF MESH N/A 03/19/2013   Procedure: INSERTION OF MESH;  Surgeon: Lodema Pilot, DO;  Location: [facility];  Service: General;  Laterality: N/A;   IR IMAGING GUIDED PORT INSERTION  03/26/2020   IR THORACENTESIS ASP PLEURAL SPACE W/IMG GUIDE  05/25/2020   VENTRAL HERNIA REPAIR N/A 03/19/2013   Procedure:  OPEN VENTRAL HERNIA REPAIR WITH MESH AND APPLICATION OF WOUND VAC;  Surgeon: Lodema Pilot, DO;  Location: [facility];  Service: General;  Laterality: N/A;   VIDEO BRONCHOSCOPY Bilateral 04/19/2018   Procedure: VIDEO BRONCHOSCOPY WITH FLUORO;  Surgeon: Oretha Milch, MD;  Location: [facility];  Service: Cardiopulmonary;  Laterality: Bilateral;    Allergies  No Known Allergies  Home Medications    Prior to Admission medications   Medication Sig Start Date End Date Taking? Authorizing Provider  acetaminophen (TYLENOL) 325 MG tablet Take 650 mg by mouth every 6 (six) hours as needed for headache (pain).    [provider]  albuterol (VENTOLIN HFA) 108 (90 Base) MCG/ACT inhaler Inhale 2 puffs into the lungs every 6 (six) hours as needed for wheezing or shortness of breath. 01/16/20   Kalman Shan, MD  anastrozole (ARIMIDEX) 1 MG tablet Take 1 tablet (1 mg total) by mouth daily. 08/30/22   Serena Croissant, MD  aspirin 81 MG chewable tablet Chew 1 tablet (81 mg total) by mouth daily. 10/05/14   Dianne Dun, MD  Blood Glucose Monitoring Suppl DEVI 1 each by Does not apply route in the morning, at noon, and at bedtime. May substitute to any manufacturer covered by patient's insurance. 11/28/22   Doreene Nest, NP  Budeson-Glycopyrrol-Formoterol (BREZTRI AEROSPHERE) 160-9-4.8 MCG/ACT AERO Inhale 2 puffs into the lungs 2 (two) times daily. 01/12/23   Hunsucker,  Lesia Sago, MD  Cholecalciferol (VITAMIN D3) 125 MCG (5000 UT) TABS Take 5,000 Units by mouth daily.    [provider]  CONTOUR NEXT TEST test strip 1 EACH BY IN VITRO ROUTE IN THE MORNING, AT NOON, AND AT BEDTIME. MAY SUBSTITUTE TO ANY MANUFACTURER COVERED BY PATIENT'S INSURANCE. 01/16/23   Doreene Nest, NP  diltiazem (CARDIZEM CD) 240 MG 24 hr capsule TAKE 1 CAPSULE BY MOUTH EVERY DAY 01/25/23   Iran Ouch, MD  furosemide (LASIX) 20 MG tablet TAKE 1 AND 1/2 TABLETS DAILY BY MOUTH 10/26/22   Furth, Cadence H, PA-C  ipratropium-albuterol (DUONEB) 0.5-2.5 (3) MG/3ML SOLN Take 3 mLs by nebulization every 3 (three) hours as needed. 04/14/20   Hunsucker, Lesia Sago, MD  lidocaine-prilocaine (EMLA) cream APPLY TO AFFECTED AREA ONCE AS DIRECTED 05/03/21   Si Gaul, MD  montelukast (SINGULAIR) 10 MG tablet TAKE 1 TABLET (10 MG TOTAL) BY MOUTH AT BEDTIME.  FOR ALLERGIES 01/24/23   Doreene Nest, NP  OXYGEN Inhale 4 L/min into the lungs continuous.    [provider]  potassium chloride (KLOR-CON M10) 10 MEQ tablet TAKE 1 TABLET BY MOUTH EVERY DAY 01/24/23   Doreene Nest, NP  rosuvastatin (CRESTOR) 5 MG tablet TAKE 1 TABLET BY MOUTH EVERY DAY FOR CHOLESTEROL 01/24/23   Doreene Nest, NP  Semaglutide,0.25 or 0.5MG /DOS, (OZEMPIC, 0.25 OR 0.5 MG/DOSE,) 2 MG/3ML SOPN Inject 0.25 mg into the skin once weekly for 4 weeks, then increase to 0.5 mg once weekly thereafter for diabetes 11/28/22   Doreene Nest, NP  Spacer/Aero-Holding Chambers (AEROCHAMBER MV) inhaler Use as instructed 01/01/17   Parrett, Virgel Bouquet, NP  prochlorperazine (COMPAZINE) 10 MG tablet Take 1 tablet (10 mg total) by mouth every 6 (six) hours as needed (Nausea or vomiting). 04/19/20 05/18/20  Serena Croissant, MD    Physical Exam    Vital Signs:  Deborah [name] does not have vital signs available for review today.  Given telephonic nature of communication, physical exam is limited. AAOx3. NAD.  Normal affect.  Speech and respirations are unlabored.  Accessory Clinical Findings    None  Assessment & Plan    1.  Preoperative Cardiovascular Risk Assessment:  The patient was advised that if she develops new symptoms prior to surgery to contact our office to arrange for a follow-up visit, and she verbalized understanding.  According to the Revised Cardiac Risk Index (RCRI), her Perioperative Risk of Major Cardiac Event is (%): 6.6  Her   according to the Duke Activity Status Index (DASI). Decreased from Respiratory status and not cardiac.    Therefore, based on ACC/AHA guidelines, patient would be at acceptable risk for the planned procedure without further cardiovascular testing. I will route this recommendation to the requesting party via Epic fax function.   Per office protocol, if patient is without any new symptoms or concerns at the time of their virtual visit, he/she may hold ASA for 5-7 days prior to procedure. Please resume ASA as soon as possible postprocedure, at the discretion of the surgeon.    A copy of this note will be routed to requesting surgeon.  Time:   Today, I have spent 10 minutes with the patient with telehealth technology discussing medical history, symptoms, and management plan.     Joni Reining, NP  02/09/2023, 3:20 PM a.m.

## 2023-02-09 ENCOUNTER — Ambulatory Visit: Payer: BC Managed Care – PPO | Attending: Cardiology | Admitting: Adult Health

## 2023-02-09 DIAGNOSIS — Z01818 Encounter for other preprocedural examination: Secondary | ICD-10-CM

## 2023-02-12 ENCOUNTER — Encounter (HOSPITAL_COMMUNITY)
Admission: RE | Admit: 2023-02-12 | Discharge: 2023-02-12 | Disposition: A | Discharge status: Home / Self Care | Payer: BC Managed Care – PPO | Source: Ambulatory Visit | Attending: Pulmonary Disease | Admitting: Pulmonary Disease

## 2023-02-12 ENCOUNTER — Other Ambulatory Visit: Payer: Self-pay | Admitting: Primary Care

## 2023-02-12 ENCOUNTER — Encounter (HOSPITAL_COMMUNITY): Payer: Self-pay

## 2023-02-12 VITALS — BP 110/62 | HR 90 | Resp 20 | Ht 63.0 in | Wt 168.4 lb

## 2023-02-12 DIAGNOSIS — J984 Other disorders of lung: Secondary | ICD-10-CM | POA: Insufficient documentation

## 2023-02-12 DIAGNOSIS — E1165 Type 2 diabetes mellitus with hyperglycemia: Secondary | ICD-10-CM

## 2023-02-12 LAB — GLUCOSE, CAPILLARY: Glucose-Capillary: 119 mg/dL — ABNORMAL HIGH (ref 70–99)

## 2023-02-12 NOTE — Progress Notes (Signed)
Deborah [name] 67 y.o. female  Pulmonary Rehab Orientation Note  This patient who was referred to Pulmonary Rehab by Dr. Judeth Horn with the diagnosis of restrictive lung disease arrived today in Cardiac and Pulmonary Rehab. She  arrived in wheelchair with normal gait. She  does carry portable oxygen. [employer] is the provider for their DME. Per patient, Deborah [name] uses oxygen continuously. Patient's medical history, psychosocial health, and medications reviewed.   Psychosocial assessment reveals patient lives with spouse. Deborah [name] is currently unemployed. Patient hobbies include watching tv and spending time with others. Patient reports her stress level is low. Areas of stress/anxiety include health. Patient does not exhibit signs of depression. PHQ2/9 score 0/0. [name] shows good  coping skills with positive outlook on life. Offered emotional support and reassurance. Will continue to monitor and evaluate progress toward psychosocial goal(s) of decreased health related stress.  Physical assessment reveals patient is alert and oriented x 4. Heart rate is normal, breath sounds with scattered rhonchi to left and expiratory wheezes throughout. Pt reports productive chronic cough. Bowel sounds present x4 quads.  Pt denies abdominal discomfort, nausea, vomiting, diarrhea or constipation. Grip strength equal, strong. Distal pulses +2; no swelling to lower extremities.   Deborah [name] reports she does take medications as prescribed. Patient states she follows a regular  diet. The patient has been trying to lose weight through a healthy diet and exercise program. Pt's weight will be monitored closely. Demonstration and practice of PLB using pulse oximeter. Deborah [name] able to return demonstration satisfactorily. Safety and hand hygiene in the exercise area reviewed with patient. Deborah [name] voices understanding of the information reviewed. Department expectations discussed with patient and achievable goals were set. The patient shows  enthusiasm about attending the program and we look forward to working with [employer]. Deborah [name] completed a 6 min walk test today and is scheduled to begin exercise on 02/20/2023 at 1315.   [id number] Deborah Hart, RN, BSN

## 2023-02-12 NOTE — Progress Notes (Signed)
Pulmonary Individual Treatment Plan  Patient Details  Name: Deborah [name] MRN: [id number] Date of Birth: [birth year] Referring Provider:   Doristine Devoid Pulmonary Rehab Walk Test from 02/12/2023 in [facility] for Heart, Vascular, & Lung Health  Referring Provider Dr. Judeth Horn       Initial Encounter Date:  Flowsheet Row Pulmonary Rehab Walk Test from 02/12/2023 in [facility] for Heart, Vascular, & Lung Health  Date 02/12/23       Visit Diagnosis: Restrictive lung disease  Patient's Home Medications on Admission:   Current Outpatient Medications:    acetaminophen (TYLENOL) 325 MG tablet, Take 650 mg by mouth every 6 (six) hours as needed for headache (pain)., Disp: , Rfl:    albuterol (VENTOLIN HFA) 108 (90 Base) MCG/ACT inhaler, Inhale 2 puffs into the lungs every 6 (six) hours as needed for wheezing or shortness of breath., Disp: 8 g, Rfl: 3   anastrozole (ARIMIDEX) 1 MG tablet, Take 1 tablet (1 mg total) by mouth daily., Disp: 90 tablet, Rfl: 3   aspirin 81 MG chewable tablet, Chew 1 tablet (81 mg total) by mouth daily., Disp: 30 tablet, Rfl: 1   Blood Glucose Monitoring Suppl DEVI, 1 each by Does not apply route in the morning, at noon, and at bedtime. May substitute to any manufacturer covered by patient's insurance., Disp: 1 each, Rfl: 0   Budeson-Glycopyrrol-Formoterol (BREZTRI AEROSPHERE) 160-9-4.8 MCG/ACT AERO, Inhale 2 puffs into the lungs 2 (two) times daily., Disp: 1 each, Rfl: 11   Cholecalciferol (VITAMIN D3) 125 MCG (5000 UT) TABS, Take 5,000 Units by mouth daily., Disp: , Rfl:    diltiazem (CARDIZEM CD) 240 MG 24 hr capsule, TAKE 1 CAPSULE BY MOUTH EVERY DAY, Disp: 90 capsule, Rfl: 2   furosemide (LASIX) 20 MG tablet, TAKE 1 AND 1/2 TABLETS DAILY BY MOUTH, Disp: 135 tablet, Rfl: 3   glucose blood (CONTOUR NEXT TEST) test strip, 1 EACH BY IN VITRO ROUTE IN THE MORNING, AT NOON, AND AT BEDTIME. MAY SUBSTITUTE TO ANY  MANUFACTURER COVERED BY PATIENT'S INSURANCE., Disp: 300 strip, Rfl: 1   ipratropium-albuterol (DUONEB) 0.5-2.5 (3) MG/3ML SOLN, Take 3 mLs by nebulization every 3 (three) hours as needed., Disp: 360 mL, Rfl: 11   lidocaine-prilocaine (EMLA) cream, APPLY TO AFFECTED AREA ONCE AS DIRECTED, Disp: 30 g, Rfl: 3   montelukast (SINGULAIR) 10 MG tablet, TAKE 1 TABLET (10 MG TOTAL) BY MOUTH AT BEDTIME. FOR ALLERGIES, Disp: 90 tablet, Rfl: 0   OXYGEN, Inhale 4 L/min into the lungs continuous., Disp: , Rfl:    potassium chloride (KLOR-CON M10) 10 MEQ tablet, TAKE 1 TABLET BY MOUTH EVERY DAY, Disp: 90 tablet, Rfl: 0   rosuvastatin (CRESTOR) 5 MG tablet, TAKE 1 TABLET BY MOUTH EVERY DAY FOR CHOLESTEROL, Disp: 90 tablet, Rfl: 0   Semaglutide,0.25 or 0.5MG /DOS, (OZEMPIC, 0.25 OR 0.5 MG/DOSE,) 2 MG/3ML SOPN, Inject 0.25 mg into the skin once weekly for 4 weeks, then increase to 0.5 mg once weekly thereafter for diabetes, Disp: 9 mL, Rfl: 0   Spacer/Aero-Holding Chambers (AEROCHAMBER MV) inhaler, Use as instructed, Disp: 1 each, Rfl: 0  Past Medical History: Past Medical History:  Diagnosis Date   Anemia    as teen   Asthma    Breast cancer (HCC)    left breast cancer 02/2020   CHF (congestive heart failure) (HCC)    COPD, mild (HCC)    Depression    Diabetes mellitus without complication (HCC)    Diverticulitis  Family history of anesthesia complication    vomiting   GI bleeding    Heart failure (HCC)    New onset 07/25/14   Histoplasmosis    left eye   Hyperkalemia    Hypertension    Lung cancer (HCC) 05/02/2018   s/p chemoradiation, immunotherapy   Obesity (BMI 30-39.9)    Pneumonia    dx wtih pneumonia on 05/27/16- seen by [facility]    PONV (postoperative nausea and vomiting)    Restrictive lung disease    Shortness of breath    with exertion    Sleep apnea    mask and oxygen at nite for sleep at 2L    Tobacco abuse    Umbilical hernia     Tobacco Use: Social History   Tobacco Use   Smoking Status Former   Current packs/day: 0.00   Average packs/day: 3.0 packs/day for 43.0 years (129.0 ttl pk-yrs)   Types: Cigarettes   Start date: 12/17/1969   Quit date: 12/17/2012   Years since quitting: 10.1  Smokeless Tobacco Never    Labs: Review Flowsheet  More data exists      Latest Ref Rng & Units 12/08/2020 02/02/2022 05/30/2022 08/08/2022 11/28/2022  Labs for ITP Cardiac and Pulmonary Rehab  Cholestrol 0 - 200 mg/dL 161  096  - - -  LDL (calc) 0 - 99 mg/dL - 59  - - -  Direct LDL mg/dL 045.4  - - - -  HDL-C >09.81 mg/dL 19.14  78.29  - - -  Trlycerides 0.0 - 149.0 mg/dL 562.1  308.6  - - -  Hemoglobin A1c 4.0 - 5.6 % 5.5  6.5  5.5  5.3  5.8     Details            Capillary Blood Glucose: Lab Results  Component Value Date   GLUCAP 119 (H) 02/12/2023   GLUCAP 103 (H) 08/16/2022   GLUCAP 118 (H) 08/16/2022   GLUCAP 117 (H) 08/08/2022   GLUCAP 89 05/13/2021    POCT Glucose     Row Name 02/12/23 0930             POCT Blood Glucose   Pre-Exercise 119 mg/dL                Pulmonary Assessment Scores:  Pulmonary Assessment Scores     Row Name 02/12/23 0925         ADL UCSD   SOB Score total 81       CAT Score   CAT Score 23       mMRC Score   mMRC Score 4             UCSD: Self-administered rating of dyspnea associated with activities of daily living (ADLs) 6-point scale (0 = "not at all" to 5 = "maximal or unable to do because of breathlessness")  Scoring Scores range from 0 to 120.  Minimally important difference is 5 units  CAT: CAT can identify the health impairment of COPD patients and is better correlated with disease progression.  CAT has a scoring range of zero to 40. The CAT score is classified into four groups of low (less than 10), medium (10 - 20), high (21-30) and very high (31-40) based on the impact level of disease on health status. A CAT score over 10 suggests significant symptoms.  A worsening CAT score could be  explained by an exacerbation, poor medication adherence, poor inhaler technique, or progression of COPD or  comorbid conditions.  CAT MCID is 2 points  mMRC: mMRC (Modified Medical Research Council) Dyspnea Scale is used to assess the degree of baseline functional disability in patients of respiratory disease due to dyspnea. No minimal important difference is established. A decrease in score of 1 point or greater is considered a positive change.   Pulmonary Function Assessment:  Pulmonary Function Assessment - 02/12/23 0924       Breath   Bilateral Breath Sounds Decreased;Rhonchi;Expiratory;Wheezes    Shortness of Breath Yes;Limiting activity             Exercise Target Goals: Exercise Program Goal: Individual exercise prescription set using results from initial 6 min walk test and THRR while considering  patient's activity barriers and safety.   Exercise Prescription Goal: Initial exercise prescription builds to 30-45 minutes a day of aerobic activity, 2-3 days per week.  Home exercise guidelines will be given to patient during program as part of exercise prescription that the participant will acknowledge.  Activity Barriers & Risk Stratification:  Activity Barriers & Cardiac Risk Stratification - 02/12/23 0925       Activity Barriers & Cardiac Risk Stratification   Activity Barriers Deconditioning;Muscular Weakness;Shortness of Breath;Back Problems             6 Minute Walk:  6 Minute Walk     Row Name 02/12/23 1021         6 Minute Walk   Phase Initial     Distance 285 feet     Walk Time 6 minutes     # of Rest Breaks 2  stopped at 1:35-2:14 and 3:38-5:17     MPH 0.54     METS 1.05     RPE 16     Perceived Dyspnea  3     Symptoms Yes (comment)     Comments SOB     Resting HR 104 bpm     Resting BP 110/62     Resting Oxygen Saturation  97 %     Exercise Oxygen Saturation  during 6 min walk 96 %     Max Ex. HR 153 bpm     Max Ex. BP 124/68     2 Minute  Post BP 112/62       Interval HR   1 Minute HR 138     2 Minute HR 138     3 Minute HR 150     4 Minute HR 153     5 Minute HR 118     6 Minute HR 145     Interval Heart Rate? Yes       Interval Oxygen   Interval Oxygen? Yes     Baseline Oxygen Saturation % 97 %     1 Minute Oxygen Saturation % 96 %     1 Minute Liters of Oxygen 2 L     2 Minute Oxygen Saturation % 96 %     2 Minute Liters of Oxygen 2 L     3 Minute Oxygen Saturation % 96 %     3 Minute Liters of Oxygen 2 L     4 Minute Oxygen Saturation % 96 %     4 Minute Liters of Oxygen 2 L     5 Minute Oxygen Saturation % 96 %     5 Minute Liters of Oxygen 2 L     6 Minute Oxygen Saturation % 97 %     6 Minute Liters of Oxygen 2  L     2 Minute Post Oxygen Saturation % 98 %     2 Minute Post Liters of Oxygen 2 L              Oxygen Initial Assessment:  Oxygen Initial Assessment - 02/12/23 0924       Home Oxygen   Home Oxygen Device Home Concentrator;E-Tanks    Sleep Oxygen Prescription Continuous    Liters per minute 2    Home Exercise Oxygen Prescription Continuous    Liters per minute 5    Home Resting Oxygen Prescription Continuous    Liters per minute 2    Compliance with Home Oxygen Use Yes      Initial 6 min Walk   Oxygen Used Continuous;E-Tanks    Liters per minute 2      Program Oxygen Prescription   Program Oxygen Prescription Continuous;E-Tanks    Liters per minute 2      Intervention   Short Term Goals To learn and exhibit compliance with exercise, home and travel O2 prescription;To learn and understand importance of maintaining oxygen saturations>88%;To learn and demonstrate proper use of respiratory medications;To learn and understand importance of monitoring SPO2 with pulse oximeter and demonstrate accurate use of the pulse oximeter.;To learn and demonstrate proper pursed lip breathing techniques or other breathing techniques.     Long  Term Goals Exhibits compliance with exercise, home   and travel O2 prescription;Maintenance of O2 saturations>88%;Compliance with respiratory medication;Verbalizes importance of monitoring SPO2 with pulse oximeter and return demonstration;Exhibits proper breathing techniques, such as pursed lip breathing or other method taught during program session;Demonstrates proper use of MDI's             Oxygen Re-Evaluation:   Oxygen Discharge (Final Oxygen Re-Evaluation):   Initial Exercise Prescription:  Initial Exercise Prescription - 02/12/23 1000       Date of Initial Exercise RX and Referring Provider   Date 02/12/23    Referring Provider Dr. Judeth Horn    Expected Discharge Date 05/10/23      Oxygen   Oxygen Continuous    Liters 2    Maintain Oxygen Saturation 88% or higher      NuStep   Level 1    SPM 40    Minutes 30    METs 1.2      Prescription Details   Frequency (times per week) 2    Duration Progress to 30 minutes of continuous aerobic without signs/symptoms of physical distress      Intensity   THRR 40-80% of Max Heartrate 62-125    Ratings of Perceived Exertion 11-13    Perceived Dyspnea 0-4      Progression   Progression Continue to progress workloads to maintain intensity without signs/symptoms of physical distress.      Resistance Training   Training Prescription Yes    Weight red bands    Reps 10-15             Perform Capillary Blood Glucose checks as needed.  Exercise Prescription Changes:   Exercise Comments:   Exercise Goals and Review:   Exercise Goals     Row Name 02/12/23 0920             Exercise Goals   Increase Physical Activity Yes       Intervention Provide advice, education, support and counseling about physical activity/exercise needs.;Develop an individualized exercise prescription for aerobic and resistive training based on initial evaluation findings, risk stratification, comorbidities and participant's personal goals.  Expected Outcomes Short Term: Attend  rehab on a regular basis to increase amount of physical activity.;Long Term: Exercising regularly at least 3-5 days a week.;Long Term: Add in home exercise to make exercise part of routine and to increase amount of physical activity.       Increase Strength and Stamina Yes       Intervention Provide advice, education, support and counseling about physical activity/exercise needs.;Develop an individualized exercise prescription for aerobic and resistive training based on initial evaluation findings, risk stratification, comorbidities and participant's personal goals.       Expected Outcomes Short Term: Increase workloads from initial exercise prescription for resistance, speed, and METs.;Short Term: Perform resistance training exercises routinely during rehab and add in resistance training at home;Long Term: Improve cardiorespiratory fitness, muscular endurance and strength as measured by increased METs and functional capacity ( )       Able to understand and use rate of perceived exertion (RPE) scale Yes       Intervention Provide education and explanation on how to use RPE scale       Expected Outcomes Short Term: Able to use RPE daily in rehab to express subjective intensity level;Long Term:  Able to use RPE to guide intensity level when exercising independently       Able to understand and use Dyspnea scale Yes       Intervention Provide education and explanation on how to use Dyspnea scale       Expected Outcomes Short Term: Able to use Dyspnea scale daily in rehab to express subjective sense of shortness of breath during exertion;Long Term: Able to use Dyspnea scale to guide intensity level when exercising independently       Knowledge and understanding of Target Heart Rate Range (THRR) Yes       Intervention Provide education and explanation of THRR including how the numbers were predicted and where they are located for reference       Expected Outcomes Short Term: Able to state/look up  THRR;Short Term: Able to use daily as guideline for intensity in rehab;Long Term: Able to use THRR to govern intensity when exercising independently       Understanding of Exercise Prescription Yes       Intervention Provide education, explanation, and written materials on patient's individual exercise prescription       Expected Outcomes Short Term: Able to explain program exercise prescription;Long Term: Able to explain home exercise prescription to exercise independently                Exercise Goals Re-Evaluation :   Discharge Exercise Prescription (Final Exercise Prescription Changes):   Nutrition:  Target Goals: Understanding of nutrition guidelines, daily intake of sodium 1500mg , cholesterol 200mg , calories 30% from fat and 7% or less from saturated fats, daily to have 5 or more servings of fruits and vegetables.  Biometrics:  Pre Biometrics - 02/12/23 1036       Pre Biometrics   Grip Strength 21 kg              Nutrition Therapy Plan and Nutrition Goals:   Nutrition Assessments:  MEDIFICTS Score Key: ?70 Need to make dietary changes  40-70 Heart Healthy Diet ? 40 Therapeutic Level Cholesterol Diet   Picture Your Plate Scores: <32 Unhealthy dietary pattern with much room for improvement. 41-50 Dietary pattern unlikely to meet recommendations for good health and room for improvement. 51-60 More healthful dietary pattern, with some room for improvement.  >60 Healthy dietary pattern,  although there may be some specific behaviors that could be improved.    Nutrition Goals Re-Evaluation:   Nutrition Goals Discharge (Final Nutrition Goals Re-Evaluation):   Psychosocial: Target Goals: Acknowledge presence or absence of significant depression and/or stress, maximize coping skills, provide positive support system. Participant is able to verbalize types and ability to use techniques and skills needed for reducing stress and depression.  Initial Review &  Psychosocial Screening:  Initial Psych Review & Screening - 02/12/23 0919       Initial Review   Current issues with Current Stress Concerns    Source of Stress Concerns Chronic Illness    Comments Pt states she worriers about her health      Family Dynamics   Good Support System? Yes    Comments Husband Casimiro Needle is biggest source of support      Barriers   Psychosocial barriers to participate in program The patient should benefit from training in stress management and relaxation.      Screening Interventions   Interventions Encouraged to exercise    Expected Outcomes Short Term goal: Utilizing psychosocial counselor, staff and physician to assist with identification of specific Stressors or current issues interfering with healing process. Setting desired goal for each stressor or current issue identified.;Long Term Goal: Stressors or current issues are controlled or eliminated.             Quality of Life Scores:  Scores of 19 and below usually indicate a poorer quality of life in these areas.  A difference of  2-3 points is a clinically meaningful difference.  A difference of 2-3 points in the total score of the Quality of Life Index has been associated with significant improvement in overall quality of life, self-image, physical symptoms, and general health in studies assessing change in quality of life.  PHQ-9: Review Flowsheet       02/12/2023 11/28/2022 02/02/2022 04/17/2016  Depression screen PHQ 2/9  Decreased Interest 0 0 0 3  Down, Depressed, Hopeless 0 0 0 3  PHQ - 2 Score 0 0 0 6  Altered sleeping 0 - 0 3  Tired, decreased energy 0 - 0 3  Change in appetite 0 - 0 2  Feeling bad or failure about yourself  0 - 0 3  Trouble concentrating 0 - 0 0  Moving slowly or fidgety/restless 0 - 0 0  Suicidal thoughts 0 - 0 1  PHQ-9 Score 0 - 0 18  Difficult doing work/chores - - Not difficult at all Very difficult    Details           Interpretation of Total Score   Total Score Depression Severity:  1-4 = Minimal depression, 5-9 = Mild depression, 10-14 = Moderate depression, 15-19 = Moderately severe depression, 20-27 = Severe depression   Psychosocial Evaluation and Intervention:  Psychosocial Evaluation - 02/12/23 0919       Psychosocial Evaluation & Interventions   Interventions Relaxation education    Comments Morayma discloses her stress comes from her health issues, more recently fighting breast and past lung cancers. She is worried about her declince in health.    Expected Outcomes For Andree to participate in PR free of any stressors or other psychosocial barriers    Continue Psychosocial Services  No Follow up required             Psychosocial Re-Evaluation:   Psychosocial Discharge (Final Psychosocial Re-Evaluation):   Education: Education Goals: Education classes will be provided on a weekly basis,  covering required topics. Participant will state understanding/return demonstration of topics presented.  Learning Barriers/Preferences:  Learning Barriers/Preferences - 02/12/23 1041       Learning Barriers/Preferences   Learning Barriers None    Learning Preferences Group Instruction;Written Material             Education Topics: Introduction to Pulmonary Rehab Group instruction provided by PowerPoint, verbal discussion, and written material to support subject matter. Instructor reviews what Pulmonary Rehab is, the purpose of the program, and how patients are referred.     Know Your Numbers Group instruction that is supported by a PowerPoint presentation. Instructor discusses importance of knowing and understanding resting, exercise, and post-exercise oxygen saturation, heart rate, and blood pressure. Oxygen saturation, heart rate, blood pressure, rating of perceived exertion, and dyspnea are reviewed along with a normal range for these values.    Exercise for the Pulmonary Patient Group instruction that is supported  by a PowerPoint presentation. Instructor discusses benefits of exercise, core components of exercise, frequency, duration, and intensity of an exercise routine, importance of utilizing pulse oximetry during exercise, safety while exercising, and options of places to exercise outside of rehab.  Flowsheet Row PULMONARY REHAB OTHER RESPIRATORY from 07/27/2016 in [facility] for Heart, Vascular, & Lung Health  Date 06/15/16  Educator EP  Instruction Review Code (Retired) 2- meets goals/outcomes       MET Level  Group instruction provided by PowerPoint, verbal discussion, and written material to support subject matter. Instructor reviews what METs are and how to increase METs.    Pulmonary Medications Verbally interactive group education provided by instructor with focus on inhaled medications and proper administration.   Anatomy and Physiology of the Respiratory System Group instruction provided by PowerPoint, verbal discussion, and written material to support subject matter. Instructor reviews respiratory cycle and anatomical components of the respiratory system and their functions. Instructor also reviews differences in obstructive and restrictive respiratory diseases with examples of each.  Flowsheet Row PULMONARY REHAB OTHER RESPIRATORY from 07/27/2016 in [facility] for Heart, Vascular, & Lung Health  Date 06/22/16  Educator rn  Instruction Review Code (Retired) 2- meets goals/outcomes       Oxygen Safety Group instruction provided by PowerPoint, verbal discussion, and written material to support subject matter. There is an overview of "What is Oxygen" and "Why do we need it".  Instructor also reviews how to create a safe environment for oxygen use, the importance of using oxygen as prescribed, and the risks of noncompliance. There is a brief discussion on traveling with oxygen and resources the patient may utilize. Flowsheet Row PULMONARY  REHAB OTHER RESPIRATORY from 07/27/2016 in [facility] for Heart, Vascular, & Lung Health  Date 05/18/16  Educator rn  Instruction Review Code (Retired) 2- meets goals/outcomes       Oxygen Use Group instruction provided by PowerPoint, verbal discussion, and written material to discuss how supplemental oxygen is prescribed and different types of oxygen supply systems. Resources for more information are provided.    Breathing Techniques Group instruction that is supported by demonstration and informational handouts. Instructor discusses the benefits of pursed lip and diaphragmatic breathing and detailed demonstration on how to perform both.     Risk Factor Reduction Group instruction that is supported by a PowerPoint presentation. Instructor discusses the definition of a risk factor, different risk factors for pulmonary disease, and how the heart and lungs work together.   MD Day A group question  and answer session with a medical doctor that allows participants to ask questions that relate to their pulmonary disease state.   Nutrition for the Pulmonary Patient Group instruction provided by PowerPoint slides, verbal discussion, and written materials to support subject matter. The instructor gives an explanation and review of healthy diet recommendations, which includes a discussion on weight management, recommendations for fruit and vegetable consumption, as well as protein, fluid, caffeine, fiber, sodium, sugar, and alcohol. Tips for eating when patients are short of breath are discussed. Flowsheet Row PULMONARY REHAB OTHER RESPIRATORY from 07/27/2016 in [facility] for Heart, Vascular, & Lung Health  Date 07/27/16  Educator edna  Instruction Review Code (Retired) 2- meets goals/outcomes        Other Education Group or individual verbal, written, or video instructions that support the educational goals of the pulmonary rehab program.     Knowledge Questionnaire Score:  Knowledge Questionnaire Score - 02/12/23 1042       Knowledge Questionnaire Score   Pre Score 17/18             Core Components/Risk Factors/Patient Goals at Admission:  Personal Goals and Risk Factors at Admission - 02/12/23 0920       Core Components/Risk Factors/Patient Goals on Admission    Weight Management Weight Loss    Improve shortness of breath with ADL's Yes    Intervention Provide education, individualized exercise plan and daily activity instruction to help decrease symptoms of SOB with activities of daily living.    Expected Outcomes Short Term: Improve cardiorespiratory fitness to achieve a reduction of symptoms when performing ADLs;Long Term: Be able to perform more ADLs without symptoms or delay the onset of symptoms             Core Components/Risk Factors/Patient Goals Review:    Core Components/Risk Factors/Patient Goals at Discharge (Final Review):    ITP Comments:   Comments: Dr. Mechele Collin is Medical Director for Pulmonary Rehab at [facility].

## 2023-02-13 ENCOUNTER — Ambulatory Visit (HOSPITAL_COMMUNITY): Payer: BC Managed Care – PPO

## 2023-02-13 ENCOUNTER — Telehealth: Payer: Self-pay | Admitting: Cardiovascular Disease

## 2023-02-13 NOTE — Telephone Encounter (Signed)
Called returned to the patient. She stated that [facility] will not accept a clearance from a NP.  Call placed to [facility]. They stated that she is good to go and the current clearance is acceptable.

## 2023-02-13 NOTE — Telephone Encounter (Signed)
Pt is requesting a callback regarding her clearance. She stated the office needing the clearance wouldn't accept it being signed by a NP, it must be signed by MD. Please advise

## 2023-02-14 ENCOUNTER — Other Ambulatory Visit: Payer: Self-pay

## 2023-02-14 ENCOUNTER — Encounter (INDEPENDENT_AMBULATORY_CARE_PROVIDER_SITE_OTHER): Payer: Self-pay

## 2023-02-14 ENCOUNTER — Inpatient Hospital Stay: Payer: BC Managed Care – PPO | Attending: Internal Medicine

## 2023-02-14 DIAGNOSIS — C50412 Malignant neoplasm of upper-outer quadrant of left female breast: Secondary | ICD-10-CM | POA: Insufficient documentation

## 2023-02-14 DIAGNOSIS — Z171 Estrogen receptor negative status [ER-]: Secondary | ICD-10-CM | POA: Diagnosis not present

## 2023-02-14 DIAGNOSIS — Z452 Encounter for adjustment and management of vascular access device: Secondary | ICD-10-CM | POA: Diagnosis not present

## 2023-02-14 DIAGNOSIS — Z95828 Presence of other vascular implants and grafts: Secondary | ICD-10-CM

## 2023-02-14 MED ORDER — HEPARIN SOD (PORK) LOCK FLUSH 100 UNIT/ML IV SOLN
500.0000 [IU] | Freq: Once | INTRAVENOUS | Status: AC
  Administered 2023-02-14: 500 [IU]

## 2023-02-14 MED ORDER — SODIUM CHLORIDE 0.9% FLUSH
10.0000 mL | Freq: Once | INTRAVENOUS | Status: AC
  Administered 2023-02-14: 10 mL

## 2023-02-15 ENCOUNTER — Other Ambulatory Visit: Payer: Self-pay | Admitting: Primary Care

## 2023-02-15 ENCOUNTER — Ambulatory Visit (HOSPITAL_COMMUNITY): Payer: BC Managed Care – PPO

## 2023-02-15 DIAGNOSIS — E1165 Type 2 diabetes mellitus with hyperglycemia: Secondary | ICD-10-CM

## 2023-02-20 ENCOUNTER — Encounter (HOSPITAL_COMMUNITY)
Admission: RE | Admit: 2023-02-20 | Discharge: 2023-02-20 | Disposition: A | Discharge status: Home / Self Care | Payer: BC Managed Care – PPO | Source: Ambulatory Visit | Attending: Pulmonary Disease | Admitting: Pulmonary Disease

## 2023-02-20 ENCOUNTER — Ambulatory Visit (HOSPITAL_COMMUNITY): Payer: BC Managed Care – PPO

## 2023-02-20 VITALS — Wt 167.1 lb

## 2023-02-20 DIAGNOSIS — J984 Other disorders of lung: Secondary | ICD-10-CM | POA: Insufficient documentation

## 2023-02-20 LAB — GLUCOSE, CAPILLARY
Glucose-Capillary: 105 mg/dL — ABNORMAL HIGH (ref 70–99)
Glucose-Capillary: 132 mg/dL — ABNORMAL HIGH (ref 70–99)

## 2023-02-20 NOTE — Progress Notes (Signed)
Daily Session Note  Patient Details  Name: Deborah [name] MRN: [id number] Date of Birth: May 29, 1956 Referring Provider:   Doristine Devoid Pulmonary Rehab Walk Test from 02/12/2023 in [facility]  Referring Provider Dr. Judeth Horn       Encounter Date: 02/20/2023  Check In:  Session Check In - 02/20/23 1323       Check-In   Supervising physician immediately available to respond to emergencies [facility] MD immediately available    Physician(s) Robin Searing, NP    Location [facility]    Staff Present Essie Hart, RN, BSN; Katrinka Blazing, RT;Johnny Hale Bogus, MS, Exercise Physiologist;Kaylee Earlene Plater, MS, ACSM-CEP, Exercise Physiologist;Samantha Belarus, RD, LDN    Virtual Visit No    Medication changes reported     Yes    Comments reviewed home med list    Fall or balance concerns reported    Yes    Comments no cane or walker use at home, states she passes out at times    Tobacco Cessation No Change    Warm-up and Cool-down Not performed (comment)   only   Resistance Training Performed Yes    VAD Patient? No    PAD/SET Patient? No      Pain Assessment   Currently in Pain? No/denies    Multiple Pain Sites No             Capillary Blood Glucose: Results for orders placed or performed during the hospital encounter of 02/20/23 (from the past 24 hour(s))  Glucose, capillary     Status: Abnormal   Collection Time: 02/20/23  1:15 PM  Result Value Ref Range   Glucose-Capillary 105 (H) 70 - 99 mg/dL  Glucose, capillary     Status: Abnormal   Collection Time: 02/20/23  2:35 PM  Result Value Ref Range   Glucose-Capillary 132 (H) 70 - 99 mg/dL     Exercise Prescription Changes - 02/20/23 1500       Response to Exercise   Blood Pressure (Admit) 108/64    Blood Pressure (Exercise) 116/66    Blood Pressure (Exit) 90/50    Heart Rate (Admit) 110 bpm    Heart Rate (Exercise) 130 bpm    Heart Rate (Exit) 124 bpm     Oxygen Saturation (Admit) 94 %    Oxygen Saturation (Exercise) 91 %    Oxygen Saturation (Exit) 96 %    Rating of Perceived Exertion (Exercise) 13    Perceived Dyspnea (Exercise) 1    Duration Progress to 30 minutes of  aerobic without signs/symptoms of physical distress    Intensity THRR unchanged      Resistance Training   Training Prescription Yes    Weight red bands    Reps 10-15    Time 10 Minutes      Oxygen   Oxygen Continuous    Liters 2      NuStep   Level 1    Minutes 30    METs 1.9      Oxygen   Maintain Oxygen Saturation 88% or higher             Social History   Tobacco Use  Smoking Status Former   Current packs/day: 0.00   Average packs/day: 3.0 packs/day for 43.0 years (129.0 ttl pk-yrs)   Types: Cigarettes   Start date: 12/17/1969   Quit date: 12/17/2012   Years since quitting: 10.1  Smokeless Tobacco Never  Goals Met:  Proper associated with RPD/PD & O2 Sat Independence with exercise equipment Exercise tolerated well No report of concerns or symptoms today Strength training completed today  Goals Unmet:  Not Applicable  Comments: Service time is from 1308 to 1435.    Dr. Mechele Collin is Medical Director for Pulmonary Rehab at [facility]

## 2023-02-21 NOTE — Progress Notes (Signed)
Pulmonary Individual Treatment Plan  Patient Details  Name: Deborah [name] MRN: [id number] Date of Birth: 21-Dec-1955 Referring Provider:   Doristine Devoid Pulmonary Rehab Walk Test from 02/12/2023 in [facility]  Referring Provider Dr. Judeth Horn       Initial Encounter Date:  Flowsheet Row Pulmonary Rehab Walk Test from 02/12/2023 in [facility]  Date 02/12/23       Visit Diagnosis: Restrictive lung disease  Patient's Home Medications on Admission:   Current Outpatient Medications:    acetaminophen (TYLENOL) 325 MG tablet, Take 650 mg by mouth every 6 (six) hours as needed for headache (pain)., Disp: , Rfl:    albuterol (VENTOLIN HFA) 108 (90 Base) MCG/ACT inhaler, Inhale 2 puffs into the lungs every 6 (six) hours as needed for wheezing or shortness of breath., Disp: 8 g, Rfl: 3   anastrozole (ARIMIDEX) 1 MG tablet, Take 1 tablet (1 mg total) by mouth daily., Disp: 90 tablet, Rfl: 3   aspirin 81 MG chewable tablet, Chew 1 tablet (81 mg total) by mouth daily., Disp: 30 tablet, Rfl: 1   Blood Glucose Monitoring Suppl DEVI, 1 each by Does not apply route in the morning, at noon, and at bedtime. May substitute to any manufacturer covered by patient's insurance., Disp: 1 each, Rfl: 0   Budeson-Glycopyrrol-Formoterol (BREZTRI AEROSPHERE) 160-9-4.8 MCG/ACT AERO, Inhale 2 puffs into the lungs 2 (two) times daily., Disp: 1 each, Rfl: 11   Cholecalciferol (VITAMIN D3) 125 MCG (5000 UT) TABS, Take 5,000 Units by mouth daily., Disp: , Rfl:    diltiazem (CARDIZEM CD) 240 MG 24 hr capsule, TAKE 1 CAPSULE BY MOUTH EVERY DAY, Disp: 90 capsule, Rfl: 2   furosemide (LASIX) 20 MG tablet, TAKE 1 AND 1/2 TABLETS DAILY BY MOUTH, Disp: 135 tablet, Rfl: 3   glucose blood (CONTOUR NEXT TEST) test strip, 1 EACH BY IN VITRO ROUTE IN THE MORNING, AT NOON, AND AT BEDTIME. MAY SUBSTITUTE TO ANY  MANUFACTURER COVERED BY PATIENT'S INSURANCE., Disp: 300 strip, Rfl: 1   ipratropium-albuterol (DUONEB) 0.5-2.5 (3) MG/3ML SOLN, Take 3 mLs by nebulization every 3 (three) hours as needed., Disp: 360 mL, Rfl: 11   lidocaine-prilocaine (EMLA) cream, APPLY TO AFFECTED AREA ONCE AS DIRECTED, Disp: 30 g, Rfl: 3   montelukast (SINGULAIR) 10 MG tablet, TAKE 1 TABLET (10 MG TOTAL) BY MOUTH AT BEDTIME. FOR ALLERGIES, Disp: 90 tablet, Rfl: 0   OXYGEN, Inhale 4 L/min into the lungs continuous., Disp: , Rfl:    potassium chloride (KLOR-CON M10) 10 MEQ tablet, TAKE 1 TABLET BY MOUTH EVERY DAY, Disp: 90 tablet, Rfl: 0   rosuvastatin (CRESTOR) 5 MG tablet, TAKE 1 TABLET BY MOUTH EVERY DAY FOR CHOLESTEROL, Disp: 90 tablet, Rfl: 0   Semaglutide,0.25 or 0.5MG /DOS, (OZEMPIC, 0.25 OR 0.5 MG/DOSE,) 2 MG/3ML SOPN, Inject 0.5 mg into the skin once a week. for diabetes., Disp: 9 mL, Rfl: 0   Spacer/Aero-Holding Chambers (AEROCHAMBER MV) inhaler, Use as instructed, Disp: 1 each, Rfl: 0  Past Medical History: Past Medical History:  Diagnosis Date   Anemia    as teen   Asthma    Breast cancer (HCC)    left breast cancer 02/2020   CHF (congestive heart failure) (HCC)    COPD, mild (HCC)    Depression    Diabetes mellitus without complication (HCC)    Diverticulitis    Family history of anesthesia complication  vomiting   GI bleeding    Heart failure (HCC)    New onset 07/25/14   Histoplasmosis    left eye   Hyperkalemia    Hypertension    Lung cancer (HCC) 05/02/2018   s/p chemoradiation, immunotherapy   Obesity (BMI 30-39.9)    Pneumonia    dx wtih pneumonia on 05/27/16- seen by [facility]    PONV (postoperative nausea and vomiting)    Restrictive lung disease    Shortness of breath    with exertion    Sleep apnea    mask and oxygen at nite for sleep at 2L    Tobacco abuse    Umbilical hernia     Tobacco Use: Social History   Tobacco Use  Smoking Status Former   Current packs/day: 0.00    Average packs/day: 3.0 packs/day for 43.0 years (129.0 ttl pk-yrs)   Types: Cigarettes   Start date: 12/17/1969   Quit date: 12/17/2012   Years since quitting: 10.1  Smokeless Tobacco Never    Labs: Review Flowsheet  More data exists      Latest Ref Rng & Units 12/08/2020 02/02/2022 05/30/2022 08/08/2022 11/28/2022  Labs for ITP Cardiac and Pulmonary Rehab  Cholestrol 0 - 200 mg/dL 829  562  - - -  LDL (calc) 0 - 99 mg/dL - 59  - - -  Direct LDL mg/dL 130.8  - - - -  HDL-C >65.78 mg/dL 46.96  29.52  - - -  Trlycerides 0.0 - 149.0 mg/dL 841.3  244.0  - - -  Hemoglobin A1c 4.0 - 5.6 % 5.5  6.5  5.5  5.3  5.8     Details            Capillary Blood Glucose: Lab Results  Component Value Date   GLUCAP 132 (H) 02/20/2023   GLUCAP 105 (H) 02/20/2023   GLUCAP 119 (H) 02/12/2023   GLUCAP 103 (H) 08/16/2022   GLUCAP 118 (H) 08/16/2022    POCT Glucose     Row Name 02/12/23 0930             POCT Blood Glucose   Pre-Exercise 119 mg/dL                Pulmonary Assessment Scores:  Pulmonary Assessment Scores     Row Name 02/12/23 0925         ADL UCSD   SOB Score total 81       CAT Score   CAT Score 23       mMRC Score   mMRC Score 4             UCSD: Self-administered rating of dyspnea associated with activities of daily living (ADLs) 6-point scale (0 = "not at all" to 5 = "maximal or unable to do because of breathlessness")  Scoring Scores range from 0 to 120.  Minimally important difference is 5 units  CAT: CAT can identify the health impairment of COPD patients and is better correlated with disease progression.  CAT has a scoring range of zero to 40. The CAT score is classified into four groups of low (less than 10), medium (10 - 20), high (21-30) and very high (31-40) based on the impact level of disease on health status. A CAT score over 10 suggests significant symptoms.  A worsening CAT score could be explained by an exacerbation, poor medication  adherence, poor inhaler technique, or progression of COPD or comorbid conditions.  CAT MCID is 2  points  mMRC: mMRC (Modified Medical Research Council) Dyspnea Scale is used to assess the degree of baseline functional disability in patients of respiratory disease due to dyspnea. No minimal important difference is established. A decrease in score of 1 point or greater is considered a positive change.   Pulmonary Function Assessment:  Pulmonary Function Assessment - 02/12/23 0924       Breath   Bilateral Breath Sounds Decreased;Rhonchi;Expiratory;Wheezes    Shortness of Breath Yes;Limiting activity             Exercise Target Goals: Exercise Program Goal: Individual exercise prescription set using results from initial 6 min walk test and THRR while considering  patient's activity barriers and safety.   Exercise Prescription Goal: Initial exercise prescription builds to 30-45 minutes a day of aerobic activity, 2-3 days per week.  Home exercise guidelines will be given to patient during program as part of exercise prescription that the participant will acknowledge.  Activity Barriers & Risk Stratification:  Activity Barriers & Cardiac Risk Stratification - 02/12/23 0925       Activity Barriers & Cardiac Risk Stratification   Activity Barriers Deconditioning;Muscular Weakness;Shortness of Breath;Back Problems             6 Minute Walk:  6 Minute Walk     Row Name 02/12/23 1021         6 Minute Walk   Phase Initial     Distance 285 feet     Walk Time 6 minutes     # of Rest Breaks 2  stopped at 1:35-2:14 and 3:38-5:17     MPH 0.54     METS 1.05     RPE 16     Perceived Dyspnea  3     Symptoms Yes (comment)     Comments SOB     Resting HR 104 bpm     Resting BP 110/62     Resting Oxygen Saturation  97 %     Exercise Oxygen Saturation  during 6 min walk 96 %     Max Ex. HR 153 bpm     Max Ex. BP 124/68     2 Minute Post BP 112/62       Interval HR   1  Minute HR 138     2 Minute HR 138     3 Minute HR 150     4 Minute HR 153     5 Minute HR 118     6 Minute HR 145     Interval Heart Rate? Yes       Interval Oxygen   Interval Oxygen? Yes     Baseline Oxygen Saturation % 97 %     1 Minute Oxygen Saturation % 96 %     1 Minute Liters of Oxygen 2 L     2 Minute Oxygen Saturation % 96 %     2 Minute Liters of Oxygen 2 L     3 Minute Oxygen Saturation % 96 %     3 Minute Liters of Oxygen 2 L     4 Minute Oxygen Saturation % 96 %     4 Minute Liters of Oxygen 2 L     5 Minute Oxygen Saturation % 96 %     5 Minute Liters of Oxygen 2 L     6 Minute Oxygen Saturation % 97 %     6 Minute Liters of Oxygen 2 L     2 Minute  Post Oxygen Saturation % 98 %     2 Minute Post Liters of Oxygen 2 L              Oxygen Initial Assessment:  Oxygen Initial Assessment - 02/12/23 0924       Home Oxygen   Home Oxygen Device Home Concentrator;E-Tanks    Sleep Oxygen Prescription Continuous    Liters per minute 2    Home Exercise Oxygen Prescription Continuous    Liters per minute 5    Home Resting Oxygen Prescription Continuous    Liters per minute 2    Compliance with Home Oxygen Use Yes      Initial 6 min Walk   Oxygen Used Continuous;E-Tanks    Liters per minute 2      Program Oxygen Prescription   Program Oxygen Prescription Continuous;E-Tanks    Liters per minute 2      Intervention   Short Term Goals To learn and exhibit compliance with exercise, home and travel O2 prescription;To learn and understand importance of maintaining oxygen saturations>88%;To learn and demonstrate proper use of respiratory medications;To learn and understand importance of monitoring SPO2 with pulse oximeter and demonstrate accurate use of the pulse oximeter.;To learn and demonstrate proper pursed lip breathing techniques or other breathing techniques.     Long  Term Goals Exhibits compliance with exercise, home  and travel O2 prescription;Maintenance of  O2 saturations>88%;Compliance with respiratory medication;Verbalizes importance of monitoring SPO2 with pulse oximeter and return demonstration;Exhibits proper breathing techniques, such as pursed lip breathing or other method taught during program session;Demonstrates proper use of MDI's             Oxygen Re-Evaluation:  Oxygen Re-Evaluation     Row Name 02/16/23 1534             Program Oxygen Prescription   Program Oxygen Prescription Continuous;E-Tanks       Liters per minute 2         Home Oxygen   Home Oxygen Device Home Concentrator;E-Tanks       Sleep Oxygen Prescription Continuous       Liters per minute 2       Home Exercise Oxygen Prescription Continuous       Liters per minute 5       Home Resting Oxygen Prescription Continuous       Liters per minute 2       Compliance with Home Oxygen Use Yes         Goals/Expected Outcomes   Short Term Goals To learn and exhibit compliance with exercise, home and travel O2 prescription;To learn and understand importance of maintaining oxygen saturations>88%;To learn and demonstrate proper use of respiratory medications;To learn and understand importance of monitoring SPO2 with pulse oximeter and demonstrate accurate use of the pulse oximeter.;To learn and demonstrate proper pursed lip breathing techniques or other breathing techniques.        Long  Term Goals Exhibits compliance with exercise, home  and travel O2 prescription;Maintenance of O2 saturations>88%;Compliance with respiratory medication;Verbalizes importance of monitoring SPO2 with pulse oximeter and return demonstration;Exhibits proper breathing techniques, such as pursed lip breathing or other method taught during program session;Demonstrates proper use of MDI's       Goals/Expected Outcomes Compliance and understanding of oxygen saturation monitoring and breathing techniques to decrease shortness of breath                Oxygen Discharge (Final Oxygen  Re-Evaluation):  Oxygen Re-Evaluation - 02/16/23 1534  Program Oxygen Prescription   Program Oxygen Prescription Continuous;E-Tanks    Liters per minute 2      Home Oxygen   Home Oxygen Device Home Concentrator;E-Tanks    Sleep Oxygen Prescription Continuous    Liters per minute 2    Home Exercise Oxygen Prescription Continuous    Liters per minute 5    Home Resting Oxygen Prescription Continuous    Liters per minute 2    Compliance with Home Oxygen Use Yes      Goals/Expected Outcomes   Short Term Goals To learn and exhibit compliance with exercise, home and travel O2 prescription;To learn and understand importance of maintaining oxygen saturations>88%;To learn and demonstrate proper use of respiratory medications;To learn and understand importance of monitoring SPO2 with pulse oximeter and demonstrate accurate use of the pulse oximeter.;To learn and demonstrate proper pursed lip breathing techniques or other breathing techniques.     Long  Term Goals Exhibits compliance with exercise, home  and travel O2 prescription;Maintenance of O2 saturations>88%;Compliance with respiratory medication;Verbalizes importance of monitoring SPO2 with pulse oximeter and return demonstration;Exhibits proper breathing techniques, such as pursed lip breathing or other method taught during program session;Demonstrates proper use of MDI's    Goals/Expected Outcomes Compliance and understanding of oxygen saturation monitoring and breathing techniques to decrease shortness of breath             Initial Exercise Prescription:  Initial Exercise Prescription - 02/12/23 1000       Date of Initial Exercise RX and Referring Provider   Date 02/12/23    Referring Provider Dr. Judeth Horn    Expected Discharge Date 05/10/23      Oxygen   Oxygen Continuous    Liters 2    Maintain Oxygen Saturation 88% or higher      NuStep   Level 1    SPM 40    Minutes 30    METs 1.2      Prescription Details    Frequency (times per week) 2    Duration Progress to 30 minutes of continuous aerobic without signs/symptoms of physical distress      Intensity   THRR 40-80% of Max Heartrate 62-125    Ratings of Perceived Exertion 11-13    Perceived Dyspnea 0-4      Progression   Progression Continue to progress workloads to maintain intensity without signs/symptoms of physical distress.      Resistance Training   Training Prescription Yes    Weight red bands    Reps 10-15             Perform Capillary Blood Glucose checks as needed.  Exercise Prescription Changes:   Exercise Prescription Changes     Row Name 02/20/23 1500             Response to Exercise   Blood Pressure (Admit) 108/64       Blood Pressure (Exercise) 116/66       Blood Pressure (Exit) 90/50       Heart Rate (Admit) 110 bpm       Heart Rate (Exercise) 130 bpm       Heart Rate (Exit) 124 bpm       Oxygen Saturation (Admit) 94 %       Oxygen Saturation (Exercise) 91 %       Oxygen Saturation (Exit) 96 %       Rating of Perceived Exertion (Exercise) 13       Perceived Dyspnea (Exercise) 1  Duration Progress to 30 minutes of  aerobic without signs/symptoms of physical distress       Intensity THRR unchanged         Resistance Training   Training Prescription Yes       Weight red bands       Reps 10-15       Time 10 Minutes         Oxygen   Oxygen Continuous       Liters 2         NuStep   Level 1       Minutes 30       METs 1.9         Oxygen   Maintain Oxygen Saturation 88% or higher                Exercise Comments:   Exercise Goals and Review:   Exercise Goals     Row Name 02/12/23 0920 02/16/23 1533           Exercise Goals   Increase Physical Activity Yes Yes      Intervention Provide advice, education, support and counseling about physical activity/exercise needs.;Develop an individualized exercise prescription for aerobic and resistive training based on initial  evaluation findings, risk stratification, comorbidities and participant's personal goals. Provide advice, education, support and counseling about physical activity/exercise needs.;Develop an individualized exercise prescription for aerobic and resistive training based on initial evaluation findings, risk stratification, comorbidities and participant's personal goals.      Expected Outcomes Short Term: Attend rehab on a regular basis to increase amount of physical activity.;Long Term: Exercising regularly at least 3-5 days a week.;Long Term: Add in home exercise to make exercise part of routine and to increase amount of physical activity. Short Term: Attend rehab on a regular basis to increase amount of physical activity.;Long Term: Exercising regularly at least 3-5 days a week.;Long Term: Add in home exercise to make exercise part of routine and to increase amount of physical activity.      Increase Strength and Stamina Yes Yes      Intervention Provide advice, education, support and counseling about physical activity/exercise needs.;Develop an individualized exercise prescription for aerobic and resistive training based on initial evaluation findings, risk stratification, comorbidities and participant's personal goals. Provide advice, education, support and counseling about physical activity/exercise needs.;Develop an individualized exercise prescription for aerobic and resistive training based on initial evaluation findings, risk stratification, comorbidities and participant's personal goals.      Expected Outcomes Short Term: Increase workloads from initial exercise prescription for resistance, speed, and METs.;Short Term: Perform resistance training exercises routinely during rehab and add in resistance training at home;Long Term: Improve cardiorespiratory fitness, muscular endurance and strength as measured by increased METs and functional capacity ( ) Short Term: Increase workloads from initial exercise  prescription for resistance, speed, and METs.;Short Term: Perform resistance training exercises routinely during rehab and add in resistance training at home;Long Term: Improve cardiorespiratory fitness, muscular endurance and strength as measured by increased METs and functional capacity ( )      Able to understand and use rate of perceived exertion (RPE) scale Yes Yes      Intervention Provide education and explanation on how to use RPE scale Provide education and explanation on how to use RPE scale      Expected Outcomes Short Term: Able to use RPE daily in rehab to express subjective intensity level;Long Term:  Able to use RPE to guide intensity level when exercising independently Short  Term: Able to use RPE daily in rehab to express subjective intensity level;Long Term:  Able to use RPE to guide intensity level when exercising independently      Able to understand and use Dyspnea scale Yes Yes      Intervention Provide education and explanation on how to use Dyspnea scale Provide education and explanation on how to use Dyspnea scale      Expected Outcomes Short Term: Able to use Dyspnea scale daily in rehab to express subjective sense of shortness of breath during exertion;Long Term: Able to use Dyspnea scale to guide intensity level when exercising independently Short Term: Able to use Dyspnea scale daily in rehab to express subjective sense of shortness of breath during exertion;Long Term: Able to use Dyspnea scale to guide intensity level when exercising independently      Knowledge and understanding of Target Heart Rate Range (THRR) Yes Yes      Intervention Provide education and explanation of THRR including how the numbers were predicted and where they are located for reference Provide education and explanation of THRR including how the numbers were predicted and where they are located for reference      Expected Outcomes Short Term: Able to state/look up THRR;Short Term: Able to use daily as  guideline for intensity in rehab;Long Term: Able to use THRR to govern intensity when exercising independently Short Term: Able to state/look up THRR;Short Term: Able to use daily as guideline for intensity in rehab;Long Term: Able to use THRR to govern intensity when exercising independently      Understanding of Exercise Prescription Yes Yes      Intervention Provide education, explanation, and written materials on patient's individual exercise prescription Provide education, explanation, and written materials on patient's individual exercise prescription      Expected Outcomes Short Term: Able to explain program exercise prescription;Long Term: Able to explain home exercise prescription to exercise independently Short Term: Able to explain program exercise prescription;Long Term: Able to explain home exercise prescription to exercise independently               Exercise Goals Re-Evaluation :  Exercise Goals Re-Evaluation     Row Name 02/16/23 1533             Exercise Goal Re-Evaluation   Exercise Goals Review Increase Physical Activity;Able to understand and use Dyspnea scale;Understanding of Exercise Prescription;Increase Strength and Stamina;Knowledge and understanding of Target Heart Rate Range (THRR);Able to understand and use rate of perceived exertion (RPE) scale       Comments Pt to begin exercise 8/6. Will monitor for progression.       Expected Outcomes Through exercise at rehab and home, the patient will decrease shortness of breath with daily activities and feel confident in carrying out an exercise regimen at home                Discharge Exercise Prescription (Final Exercise Prescription Changes):  Exercise Prescription Changes - 02/20/23 1500       Response to Exercise   Blood Pressure (Admit) 108/64    Blood Pressure (Exercise) 116/66    Blood Pressure (Exit) 90/50    Heart Rate (Admit) 110 bpm    Heart Rate (Exercise) 130 bpm    Heart Rate (Exit) 124 bpm     Oxygen Saturation (Admit) 94 %    Oxygen Saturation (Exercise) 91 %    Oxygen Saturation (Exit) 96 %    Rating of Perceived Exertion (Exercise) 13  Perceived Dyspnea (Exercise) 1    Duration Progress to 30 minutes of  aerobic without signs/symptoms of physical distress    Intensity THRR unchanged      Resistance Training   Training Prescription Yes    Weight red bands    Reps 10-15    Time 10 Minutes      Oxygen   Oxygen Continuous    Liters 2      NuStep   Level 1    Minutes 30    METs 1.9      Oxygen   Maintain Oxygen Saturation 88% or higher             Nutrition:  Target Goals: Understanding of nutrition guidelines, daily intake of sodium 1500mg , cholesterol 200mg , calories 30% from fat and 7% or less from saturated fats, daily to have 5 or more servings of fruits and vegetables.  Biometrics:  Pre Biometrics - 02/12/23 1036       Pre Biometrics   Grip Strength 21 kg              Nutrition Therapy Plan and Nutrition Goals:  Nutrition Therapy & Goals - 02/20/23 1512       Nutrition Therapy   Diet Heart Healthy Diet    Drug/Food Interactions Statins/Certain Fruits      Personal Nutrition Goals   Nutrition Goal Patient to improve diet quality by using the plate method as a guide for meal planning to include lean protein/plant protein, fruits, vegetables, whole grains, nonfat dairy as part of a well-balanced diet.    Personal Goal #2 Patient to identify strategies for weight loss of 0.5-2.0# per week.    Comments Deborah [name] started ozempic in May 2024 at 181#; she is down ~14#. She has made many dietary changes including reduced carbohydrate intake and decreased deep frying foods. She remains motivated to improve nutrition and lose weight. She eating 2 meals daily and her husband is doing the majority of the cooking and she does most of the meal planning. Patient will benefit from participation in pulmonary rehab for nutrition, exercise, and lifestyle  modification.      Intervention Plan   Intervention Prescribe, educate and counsel regarding individualized specific dietary modifications aiming towards targeted core components such as weight, hypertension, lipid management, diabetes, heart failure and other comorbidities.;Nutrition handout(s) given to patient.    Expected Outcomes Short Term Goal: Understand basic principles of dietary content, such as calories, fat, sodium, cholesterol and nutrients.;Long Term Goal: Adherence to prescribed nutrition plan.             Nutrition Assessments:  MEDIFICTS Score Key: ?70 Need to make dietary changes  40-70 Heart Healthy Diet ? 40 Therapeutic Level Cholesterol Diet   Picture Your Plate Scores: <16 Unhealthy dietary pattern with much room for improvement. 41-50 Dietary pattern unlikely to meet recommendations for good health and room for improvement. 51-60 More healthful dietary pattern, with some room for improvement.  >60 Healthy dietary pattern, although there may be some specific behaviors that could be improved.    Nutrition Goals Re-Evaluation:  Nutrition Goals Re-Evaluation     Row Name 02/20/23 1512             Goals   Current Weight 167 lb 1.7 oz (75.8 kg)       Comment A1c 5.8, most recent lipid panel from 01/2022 triglycerides 187       Expected Outcome Deborah [name] started ozempic in May 2024 at 181#; she is down ~14#.  She has made many dietary changes including reduced carbohydrate intake and decreased deep frying foods. She remains motivated to improve nutrition and lose weight. She eating 2 meals daily and her husband is doing the majority of the cooking and she does most of the meal planning. Patient will benefit from participation in pulmonary rehab for nutrition, exercise, and lifestyle modification.                Nutrition Goals Discharge (Final Nutrition Goals Re-Evaluation):  Nutrition Goals Re-Evaluation - 02/20/23 1512       Goals   Current Weight 167  lb 1.7 oz (75.8 kg)    Comment A1c 5.8, most recent lipid panel from 01/2022 triglycerides 187    Expected Outcome Deborah [name] started ozempic in May 2024 at 181#; she is down ~14#. She has made many dietary changes including reduced carbohydrate intake and decreased deep frying foods. She remains motivated to improve nutrition and lose weight. She eating 2 meals daily and her husband is doing the majority of the cooking and she does most of the meal planning. Patient will benefit from participation in pulmonary rehab for nutrition, exercise, and lifestyle modification.             Psychosocial: Target Goals: Acknowledge presence or absence of significant depression and/or stress, maximize coping skills, provide positive support system. Participant is able to verbalize types and ability to use techniques and skills needed for reducing stress and depression.  Initial Review & Psychosocial Screening:  Initial Psych Review & Screening - 02/12/23 0919       Initial Review   Current issues with Current Stress Concerns    Source of Stress Concerns Chronic Illness    Comments Pt states she worriers about her health      Family Dynamics   Good Support System? Yes    Comments Husband Casimiro Needle is biggest source of support      Barriers   Psychosocial barriers to participate in program The patient should benefit from training in stress management and relaxation.      Screening Interventions   Interventions Encouraged to exercise    Expected Outcomes Short Term goal: Utilizing psychosocial counselor, staff and physician to assist with identification of specific Stressors or current issues interfering with healing process. Setting desired goal for each stressor or current issue identified.;Long Term Goal: Stressors or current issues are controlled or eliminated.             Quality of Life Scores:  Scores of 19 and below usually indicate a poorer quality of life in these areas.  A difference of   2-3 points is a clinically meaningful difference.  A difference of 2-3 points in the total score of the Quality of Life Index has been associated with significant improvement in overall quality of life, self-image, physical symptoms, and general health in studies assessing change in quality of life.  PHQ-9: Review Flowsheet       02/12/2023 11/28/2022 02/02/2022 04/17/2016  Depression screen PHQ 2/9  Decreased Interest 0 0 0 3  Down, Depressed, Hopeless 0 0 0 3  PHQ - 2 Score 0 0 0 6  Altered sleeping 0 - 0 3  Tired, decreased energy 0 - 0 3  Change in appetite 0 - 0 2  Feeling bad or failure about yourself  0 - 0 3  Trouble concentrating 0 - 0 0  Moving slowly or fidgety/restless 0 - 0 0  Suicidal thoughts 0 - 0 1  PHQ-9  Score 0 - 0 18  Difficult doing work/chores - - Not difficult at all Very difficult    Details           Interpretation of Total Score  Total Score Depression Severity:  1-4 = Minimal depression, 5-9 = Mild depression, 10-14 = Moderate depression, 15-19 = Moderately severe depression, 20-27 = Severe depression   Psychosocial Evaluation and Intervention:  Psychosocial Evaluation - 02/12/23 0919       Psychosocial Evaluation & Interventions   Interventions Relaxation education    Comments Deborah [name] discloses her stress comes from her health issues, more recently fighting breast and past lung cancers. She is worried about her declince in health.    Expected Outcomes For Deborah [name] to participate in PR free of any stressors or other psychosocial barriers    Continue Psychosocial Services  No Follow up required             Psychosocial Re-Evaluation:  Psychosocial Re-Evaluation     Row Name 02/16/23 1453             Psychosocial Re-Evaluation   Current issues with Current Stress Concerns       Comments No changes since orientation on 7/29. Deborah [name] is scheduled to start PR class on 8/6       Expected Outcomes For Deborah [name] to attend PR without any psychosocial  barriers or concerns       Interventions Encouraged to attend Pulmonary Rehabilitation for the exercise       Continue Psychosocial Services  No Follow up required                Psychosocial Discharge (Final Psychosocial Re-Evaluation):  Psychosocial Re-Evaluation - 02/16/23 1453       Psychosocial Re-Evaluation   Current issues with Current Stress Concerns    Comments No changes since orientation on 7/29. Deborah [name] is scheduled to start PR class on 8/6    Expected Outcomes For Deborah [name] to attend PR without any psychosocial barriers or concerns    Interventions Encouraged to attend Pulmonary Rehabilitation for the exercise    Continue Psychosocial Services  No Follow up required             Education: Education Goals: Education classes will be provided on a weekly basis, covering required topics. Participant will state understanding/return demonstration of topics presented.  Learning Barriers/Preferences:  Learning Barriers/Preferences - 02/12/23 1041       Learning Barriers/Preferences   Learning Barriers None    Learning Preferences Group Instruction;Written Material             Education Topics: Introduction to Pulmonary Rehab Group instruction provided by PowerPoint, verbal discussion, and written material to support subject matter. Instructor reviews what Pulmonary Rehab is, the purpose of the program, and how patients are referred.     Know Your Numbers Group instruction that is supported by a PowerPoint presentation. Instructor discusses importance of knowing and understanding resting, exercise, and post-exercise oxygen saturation, heart rate, and blood pressure. Oxygen saturation, heart rate, blood pressure, rating of perceived exertion, and dyspnea are reviewed along with a normal range for these values.    Exercise for the Pulmonary Patient Group instruction that is supported by a PowerPoint presentation. Instructor discusses benefits of exercise, core  components of exercise, frequency, duration, and intensity of an exercise routine, importance of utilizing pulse oximetry during exercise, safety while exercising, and options of places to exercise outside of rehab.  Flowsheet Row PULMONARY REHAB OTHER RESPIRATORY from 07/27/2016 in [place]  [facility]  Date 06/15/16  Educator EP  Instruction Review Code (Retired) 2- meets goals/outcomes          MET Level  Group instruction provided by PowerPoint, verbal discussion, and written material to support subject matter. Instructor reviews what METs are and how to increase METs.    Pulmonary Medications Verbally interactive group education provided by instructor with focus on inhaled medications and proper administration.   Anatomy and Physiology of the Respiratory System Group instruction provided by PowerPoint, verbal discussion, and written material to support subject matter. Instructor reviews respiratory cycle and anatomical components of the respiratory system and their functions. Instructor also reviews differences in obstructive and restrictive respiratory diseases with examples of each.  Flowsheet Row PULMONARY REHAB OTHER RESPIRATORY from 07/27/2016 in [facility]  Date 06/22/16  Educator rn  Instruction Review Code (Retired) 2- meets goals/outcomes       Oxygen Safety Group instruction provided by PowerPoint, verbal discussion, and written material to support subject matter. There is an overview of "What is Oxygen" and "Why do we need it".  Instructor also reviews how to create a safe environment for oxygen use, the importance of using oxygen as prescribed, and the risks of noncompliance. There is a brief discussion on traveling with oxygen and resources the patient may utilize. Flowsheet Row PULMONARY REHAB OTHER RESPIRATORY from 07/27/2016 in [facility]  Date 05/18/16  Educator rn  Instruction Review Code (Retired) 2- meets goals/outcomes       Oxygen Use Group instruction provided by PowerPoint, verbal discussion, and written material to discuss how supplemental oxygen is prescribed and different types of oxygen supply systems. Resources for more information are provided.    Breathing Techniques Group instruction that is supported by demonstration and informational handouts. Instructor discusses the benefits of pursed lip and diaphragmatic breathing and detailed demonstration on how to perform both.     Risk Factor Reduction Group instruction that is supported by a PowerPoint presentation. Instructor discusses the definition of a risk factor, different risk factors for pulmonary disease, and how the heart and lungs work together.   MD Day A group question and answer session with a medical doctor that allows participants to ask questions that relate to their pulmonary disease state.   Nutrition for the Pulmonary Patient Group instruction provided by PowerPoint slides, verbal discussion, and written materials to support subject matter. The instructor gives an explanation and review of healthy diet recommendations, which includes a discussion on weight management, recommendations for fruit and vegetable consumption, as well as protein, fluid, caffeine, fiber, sodium, sugar, and alcohol. Tips for eating when patients are short of breath are discussed. Flowsheet Row PULMONARY REHAB OTHER RESPIRATORY from 07/27/2016 in [facility]  Date 07/27/16  Educator edna  Instruction Review Code (Retired) 2- meets goals/outcomes        Other Education Group or individual verbal, written, or video instructions that support the educational goals of the pulmonary rehab program.    Knowledge Questionnaire Score:  Knowledge Questionnaire Score -  02/12/23 1042       Knowledge Questionnaire Score   Pre Score 17/18             Core Components/Risk Factors/Patient Goals at Admission:  Personal Goals and Risk Factors at Admission - 02/12/23 0920  Core Components/Risk Factors/Patient Goals on Admission    Weight Management Weight Loss    Improve shortness of breath with ADL's Yes    Intervention Provide education, individualized exercise plan and daily activity instruction to help decrease symptoms of SOB with activities of daily living.    Expected Outcomes Short Term: Improve cardiorespiratory fitness to achieve a reduction of symptoms when performing ADLs;Long Term: Be able to perform more ADLs without symptoms or delay the onset of symptoms             Core Components/Risk Factors/Patient Goals Review:   Goals and Risk Factor Review     Row Name 02/16/23 1455             Core Components/Risk Factors/Patient Goals Review   Personal Goals Review Weight Management/Obesity;Improve shortness of breath with ADL's;Develop more efficient breathing techniques such as purse lipped breathing and diaphragmatic breathing and practicing self-pacing with activity.       Review Unable to evaluate progress yet. Pt will start her first class on 8/6/       Expected Outcomes see admission goals                Core Components/Risk Factors/Patient Goals at Discharge (Final Review):   Goals and Risk Factor Review - 02/16/23 1455       Core Components/Risk Factors/Patient Goals Review   Personal Goals Review Weight Management/Obesity;Improve shortness of breath with ADL's;Develop more efficient breathing techniques such as purse lipped breathing and diaphragmatic breathing and practicing self-pacing with activity.    Review Unable to evaluate progress yet. Pt will start her first class on 8/6/    Expected Outcomes see admission goals             ITP Comments:Pt is making expected progress toward Pulmonary Rehab  goals after completing 1 sessions. Recommend continued exercise, life style modification, education, and utilization of breathing techniques to increase stamina and strength, while also decreasing shortness of breath with exertion.  Dr. Mechele Collin is Medical Director for Pulmonary Rehab at [facility].     Comments: Dr. Mechele Collin is Medical Director for Pulmonary Rehab at [facility].

## 2023-02-22 ENCOUNTER — Encounter (HOSPITAL_COMMUNITY): Payer: BC Managed Care – PPO

## 2023-02-22 ENCOUNTER — Ambulatory Visit (HOSPITAL_COMMUNITY): Payer: BC Managed Care – PPO

## 2023-02-27 ENCOUNTER — Encounter (HOSPITAL_COMMUNITY)
Admission: RE | Admit: 2023-02-27 | Discharge: 2023-02-27 | Disposition: A | Discharge status: Home / Self Care | Payer: BC Managed Care – PPO | Source: Ambulatory Visit | Attending: Pulmonary Disease | Admitting: Pulmonary Disease

## 2023-02-27 ENCOUNTER — Ambulatory Visit (HOSPITAL_COMMUNITY): Payer: BC Managed Care – PPO

## 2023-02-27 DIAGNOSIS — J984 Other disorders of lung: Secondary | ICD-10-CM | POA: Diagnosis not present

## 2023-02-27 LAB — GLUCOSE, CAPILLARY
Glucose-Capillary: 98 mg/dL (ref 70–99)
Glucose-Capillary: 132 mg/dL — ABNORMAL HIGH (ref 70–99)

## 2023-02-27 NOTE — Progress Notes (Signed)
Daily Session Note  Patient Details  Name: Deborah [name] MRN: [id number] Date of Birth: [birth year] Referring Provider:   Doristine Devoid Pulmonary Rehab Walk Test from 02/12/2023 in [facility]  Referring Provider Dr. Judeth Horn       Encounter Date: 02/27/2023  Check In:  Session Check In - 02/27/23 1336       Check-In   Supervising physician immediately available to respond to emergencies [facility] MD immediately available    Physician(s) Jari Favre, PA    Location [facility]    Staff Present Essie Hart, RN, BSN;Casey Katrinka Blazing, Zella Richer, MS, ACSM-CEP, Exercise Physiologist;Samantha Belarus, RD, LDN;Randi Idelle Crouch BS, ACSM-CEP, Exercise Physiologist    Virtual Visit No    Medication changes reported     No    Comments reviewed home med list    Fall or balance concerns reported    Yes    Comments no cane or walker use at home, states she passes out at times    Tobacco Cessation No Change    Warm-up and Cool-down Performed as group-led instruction   only   Resistance Training Performed Yes    VAD Patient? No    PAD/SET Patient? No      Pain Assessment   Currently in Pain? No/denies    Multiple Pain Sites No             Capillary Blood Glucose: Results for orders placed or performed during the hospital encounter of 02/27/23 (from the past 24 hour(s))  Glucose, capillary     Status: None   Collection Time: 02/27/23  1:28 PM  Result Value Ref Range   Glucose-Capillary 98 70 - 99 mg/dL  Glucose, capillary     Status: Abnormal   Collection Time: 02/27/23  2:33 PM  Result Value Ref Range   Glucose-Capillary 132 (H) 70 - 99 mg/dL      Social History   Tobacco Use  Smoking Status Former   Current packs/day: 0.00   Average packs/day: 3.0 packs/day for 43.0 years (129.0 ttl pk-yrs)   Types: Cigarettes   Start date: 12/17/1969   Quit date: 12/17/2012   Years since quitting: 10.2  Smokeless  Tobacco Never    Goals Met:  Proper associated with RPD/PD & O2 Sat Exercise tolerated well No report of concerns or symptoms today Strength training completed today  Goals Unmet:  Not Applicable  Comments: Service time is from 1324 to 1445.    Dr. Mechele Collin is Medical Director for Pulmonary Rehab at [facility].

## 2023-02-28 ENCOUNTER — Encounter: Payer: Self-pay | Admitting: Primary Care

## 2023-02-28 ENCOUNTER — Ambulatory Visit: Payer: BC Managed Care – PPO | Admitting: Primary Care

## 2023-02-28 VITALS — BP 110/66 | HR 88 | Temp 98.2°F | Ht 63.0 in | Wt 163.6 lb

## 2023-02-28 DIAGNOSIS — Z23 Encounter for immunization: Secondary | ICD-10-CM

## 2023-02-28 DIAGNOSIS — G4733 Obstructive sleep apnea (adult) (pediatric): Secondary | ICD-10-CM | POA: Diagnosis not present

## 2023-02-28 DIAGNOSIS — J9611 Chronic respiratory failure with hypoxia: Secondary | ICD-10-CM

## 2023-02-28 DIAGNOSIS — I5032 Chronic diastolic (congestive) heart failure: Secondary | ICD-10-CM | POA: Diagnosis not present

## 2023-02-28 DIAGNOSIS — Z171 Estrogen receptor negative status [ER-]: Secondary | ICD-10-CM

## 2023-02-28 DIAGNOSIS — E1165 Type 2 diabetes mellitus with hyperglycemia: Secondary | ICD-10-CM

## 2023-02-28 DIAGNOSIS — Z1211 Encounter for screening for malignant neoplasm of colon: Secondary | ICD-10-CM

## 2023-02-28 DIAGNOSIS — F32A Depression, unspecified: Secondary | ICD-10-CM

## 2023-02-28 DIAGNOSIS — I10 Essential (primary) hypertension: Secondary | ICD-10-CM

## 2023-02-28 DIAGNOSIS — Z0000 Encounter for general adult medical examination without abnormal findings: Secondary | ICD-10-CM

## 2023-02-28 DIAGNOSIS — E785 Hyperlipidemia, unspecified: Secondary | ICD-10-CM | POA: Diagnosis not present

## 2023-02-28 DIAGNOSIS — J432 Centrilobular emphysema: Secondary | ICD-10-CM

## 2023-02-28 DIAGNOSIS — C50412 Malignant neoplasm of upper-outer quadrant of left female breast: Secondary | ICD-10-CM

## 2023-02-28 DIAGNOSIS — C3432 Malignant neoplasm of lower lobe, left bronchus or lung: Secondary | ICD-10-CM

## 2023-02-28 LAB — LIPID PANEL
Cholesterol: 114 mg/dL (ref 0–200)
HDL: 36.9 mg/dL — ABNORMAL LOW (ref >39.00)
LDL Cholesterol: 45 mg/dL (ref 0–99)
NonHDL: 77.47
Total CHOL/HDL Ratio: 3
Triglycerides: 162 mg/dL — ABNORMAL HIGH (ref 0.0–149.0)
VLDL: 32.4 mg/dL (ref 0.0–40.0)

## 2023-02-28 LAB — HEMOGLOBIN A1C: Hgb A1c MFr Bld: 5.4 % (ref 4.6–6.5)

## 2023-02-28 MED ORDER — SEMAGLUTIDE (1 MG/DOSE) 4 MG/3ML ~~LOC~~ SOPN: 1.0000 mg | PEN_INJECTOR | SUBCUTANEOUS | 0 refills | Status: DC

## 2023-02-28 NOTE — Assessment & Plan Note (Signed)
Appears euvolemic today. Following with cardiology.  Continue furosemide 30 mg daily with potassium chloride 10 mEq daily, continue diltiazem 240 mg daily.  Reviewed labs from May 2024.

## 2023-02-28 NOTE — Patient Instructions (Signed)
Stop by the lab prior to leaving today. I will notify you of your results once received.   You will either be contacted via phone regarding your referral to GI, or you may receive a letter on your MyChart portal from our referral team with instructions for scheduling an appointment. Please let us know if you have not been contacted by anyone within two weeks.  We increased the dose of your Ozempic to 1 mg weekly.  Please schedule a follow up visit for 6 months for a diabetes check.  It was a pleasure to see you today!

## 2023-02-28 NOTE — Assessment & Plan Note (Signed)
Non compliant to CPAP machine. Following with pulmonology.

## 2023-02-28 NOTE — Assessment & Plan Note (Signed)
Prevnar 20 provided today. Pap smear overdue, she declines despite recommendations Mammogram up-to-date. Colonoscopy overdue, referral placed to GI.  Discussed the importance of a healthy diet and regular exercise in order for weight loss, and to reduce the risk of further co-morbidity.  Exam stable. Labs pending.  Follow up in 1 year for repeat physical.

## 2023-02-28 NOTE — Assessment & Plan Note (Signed)
Repeat A1c pending.  She would like to increase the dose of her Ozempic given weight loss benefits. Increase Ozempic to 1 mg weekly.  Follow-up in 3 to 6 months based on A1c result.

## 2023-02-28 NOTE — Assessment & Plan Note (Signed)
Following with oncology and pulmonology. No longer receiving treatment.  Reviewed CT chest from May 2024.

## 2023-02-28 NOTE — Progress Notes (Signed)
Subjective:    Patient ID: Deborah [name], female    DOB: 14-Jan-1956, 67 y.o.   MRN: [id number]  HPI  Deborah [name] is a very pleasant 67 y.o. female who presents today for complete physical and follow up of chronic conditions.  Immunizations: -Tetanus: Completed in 2019 -Shingles: Completed Shingrix series  -Pneumonia: Completed Prevnar 13 in 2018, Pneumovax 23 in 2016.   Diet: Fair diet.  Exercise: No regular exercise.  Eye exam: Completes annually  Dental exam: Completes semi-annually. Recently got dentures.   Pap Smear: Overdue, declines today Mammogram: Completed in June 2024  Colonoscopy: Completed in 2017, due 2018 but did not complete. She agrees to complete.  Lung Cancer Screening: Completed CT chest in May 2024  BP Readings from Last 3 Encounters:  02/28/23 110/66  02/12/23 110/62  01/12/23 114/60   Wt Readings from Last 3 Encounters:  02/28/23 163 lb 9.6 oz (74.2 kg)  02/20/23 167 lb 1.7 oz (75.8 kg)  02/12/23 168 lb 6.9 oz (76.4 kg)      Review of Systems  Constitutional:  Negative for unexpected weight change.  HENT:  Negative for rhinorrhea.   Respiratory:  Positive for cough and shortness of breath.   Cardiovascular:  Negative for chest pain.  Gastrointestinal:  Positive for constipation. Negative for diarrhea.  Genitourinary:  Negative for difficulty urinating.  Musculoskeletal:  Negative for arthralgias and myalgias.  Skin:  Negative for rash.  Allergic/Immunologic: Positive for environmental allergies.  Neurological:  Negative for dizziness, numbness and headaches.  Psychiatric/Behavioral:  The patient is not nervous/anxious.          Past Medical History:  Diagnosis Date   Acute hypoxemic respiratory failure (HCC)    Acute on chronic respiratory failure with hypoxia (HCC) 05/24/2020   Anemia    as teen   Asthma    Breast cancer (HCC)    left breast cancer 02/2020   CHF (congestive heart failure) (HCC)    CHF, acute on chronic,  unknown EF 07/25/2014   COPD exacerbation (HCC) 12/18/2015   COPD, mild (HCC)    Depression    Diabetes mellitus without complication (HCC)    Diverticulitis    Diverticulosis of colon with hemorrhage    Dyspnea 04/17/2013   Followed in Pulmonary clinic/ [facility]  - hfa 75% p coaching 04/17/2013   - PFT's 06/05/2013 no airflow obst, dlco 60 corrects to 96%     Family history of anesthesia complication    vomiting   GI bleeding    Heart failure (HCC)    New onset 07/25/14   Histoplasmosis    left eye   Hyperkalemia    Hypertension    Lung cancer (HCC) 05/02/2018   s/p chemoradiation, immunotherapy   Obesity (BMI 30-39.9)    Pneumonia    dx wtih pneumonia on 05/27/16- seen by Deborah [name]    PONV (postoperative nausea and vomiting)    Restrictive lung disease    Severe sepsis (HCC)    Shortness of breath    with exertion    Sleep apnea    mask and oxygen at nite for sleep at 2L    Tobacco abuse    Umbilical hernia     Social History   Socioeconomic History   Marital status: Married    Spouse name: Not on file   Number of children: 1   Years of education: Not on file   Highest education level: 12th grade  Occupational History   Occupation:  Retired    Associate Professor: NOT EMPLOYED  Tobacco Use   Smoking status: Former    Current packs/day: 0.00    Average packs/day: 3.0 packs/day for 43.0 years (129.0 ttl pk-yrs)    Types: Cigarettes    Start date: 12/17/1969    Quit date: 12/17/2012    Years since quitting: 10.2   Smokeless tobacco: Never  Vaping Use   Vaping status: Former  Substance and Sexual Activity   Alcohol use: No    Alcohol/week: 0.0 standard drinks of alcohol   Drug use: No   Sexual activity: Not on file  Other Topics Concern   Not on file  Social History Narrative   ** Merged History Encounter ** ** Data from: 07/25/14 Enc Dept: [facility] ** Data from: 06/05/13 Enc [facility], one son 80 yo.      Watch TV, spend time  with family   Social Determinants of Health   Financial Resource Strain: Low Risk  (11/28/2022)   Overall Financial Resource Strain (CARDIA)    Difficulty of Paying Living Expenses: Not hard at all  Food Insecurity: No Food Insecurity (11/28/2022)   Hunger Vital Sign    Worried About Running Out of Food in the Last Year: Never true    Ran Out of Food in the Last Year: Never true  Transportation Needs: No Transportation Needs (11/28/2022)   PRAPARE - Administrator, Civil Service (Medical): No    Lack of Transportation (Non-Medical): No  Physical Activity: Unknown (11/28/2022)   Exercise Vital Sign    Days of Exercise per Week: 0 days    Minutes of Exercise per Session: Not on file  Stress: Stress Concern Present (11/28/2022)   [employer] of Occupational Health - Occupational Stress Questionnaire    Feeling of Stress : Very much  Social Connections: Moderately Isolated (11/28/2022)   Social Connection and Isolation Panel [NHANES]    Frequency of Communication with Friends and Family: Three times a week    Frequency of Social Gatherings with Friends and Family: Once a week    Attends Religious Services: Never    Database administrator or Organizations: No    Attends Engineer, structural: Not on file    Marital Status: Married  Catering manager Violence: Not on file    Past Surgical History:  Procedure Laterality Date   APPLICATION OF WOUND VAC  03/19/2013   Procedure: APPLICATION OF WOUND VAC;  Surgeon: Lodema Pilot, DO;  Location: [facility];  Service: General;;   BREAST BIOPSY Left 09/29/2021   BREAST BIOPSY Left 10/18/2021   BREAST LUMPECTOMY Left 04/08/2020   BREAST LUMPECTOMY Left 08/16/2022   Procedure: LEFT BREAST LUMPECTOMY;  Surgeon: Harriette Bouillon, MD;  Location: [facility];  Service: General;  Laterality: Left;   BREAST LUMPECTOMY WITH RADIOACTIVE SEED AND SENTINEL LYMPH NODE BIOPSY Left 04/08/2020   Procedure: LEFT BREAST LUMPECTOMY WITH RADIOACTIVE  SEED AND LEFT AXILLARY SENTINEL LYMPH NODE BIOPSY;  Surgeon: Ovidio Kin, MD;  Location: [facility] OR;  Service: General;  Laterality: Left;  PEC BLOCK, NEEDS ANESTHESIA CONSULT   CESAREAN SECTION  04/12/1985   COLONOSCOPY WITH PROPOFOL N/A 06/13/2016   Procedure: COLONOSCOPY WITH PROPOFOL;  Surgeon: Beverley Fiedler, MD;  Location: [facility];  Service: Gastroenterology;  Laterality: N/A;   ERCP N/A 02/15/2017   Procedure: ENDOSCOPIC RETROGRADE CHOLANGIOPANCREATOGRAPHY (ERCP);  Surgeon: Rachael Fee, MD;  Location: [facility];  Service: Endoscopy;  Laterality: N/A;   EYE SURGERY Left  INSERTION OF MESH N/A 03/19/2013   Procedure: INSERTION OF MESH;  Surgeon: Lodema Pilot, DO;  Location: [facility];  Service: General;  Laterality: N/A;   IR IMAGING GUIDED PORT INSERTION  03/26/2020   IR THORACENTESIS ASP PLEURAL SPACE W/IMG GUIDE  05/25/2020   VENTRAL HERNIA REPAIR N/A 03/19/2013   Procedure:  OPEN VENTRAL HERNIA REPAIR WITH MESH AND APPLICATION OF WOUND VAC;  Surgeon: Lodema Pilot, DO;  Location: [facility];  Service: General;  Laterality: N/A;   VIDEO BRONCHOSCOPY Bilateral 04/19/2018   Procedure: VIDEO BRONCHOSCOPY WITH FLUORO;  Surgeon: Oretha Milch, MD;  Location: [facility];  Service: Cardiopulmonary;  Laterality: Bilateral;    Family History  Problem Relation Age of Onset   Heart attack Mother    Emphysema Mother        was a smoker   Congestive Heart Failure Father    Bipolar disorder Father    Lymphoma Maternal Uncle    Pancreatic cancer Maternal Aunt    Bipolar disorder Sister    Schizophrenia Sister    Other Sister        tumor in her neck   Melanoma Sister    Heart attack Maternal Uncle    Heart attack Maternal Uncle    Colon cancer Neg Hx    Breast cancer Neg Hx     No Known Allergies  Current Outpatient Medications on File Prior to Visit  Medication Sig Dispense Refill   acetaminophen (TYLENOL) 325 MG tablet Take 650 mg by mouth every 6 (six) hours as needed for  headache (pain).     albuterol (VENTOLIN HFA) 108 (90 Base) MCG/ACT inhaler Inhale 2 puffs into the lungs every 6 (six) hours as needed for wheezing or shortness of breath. 8 g 3   anastrozole (ARIMIDEX) 1 MG tablet Take 1 tablet (1 mg total) by mouth daily. 90 tablet 3   aspirin 81 MG chewable tablet Chew 1 tablet (81 mg total) by mouth daily. 30 tablet 1   Blood Glucose Monitoring Suppl DEVI 1 each by Does not apply route in the morning, at noon, and at bedtime. May substitute to any manufacturer covered by patient's insurance. 1 each 0   Budeson-Glycopyrrol-Formoterol (BREZTRI AEROSPHERE) 160-9-4.8 MCG/ACT AERO Inhale 2 puffs into the lungs 2 (two) times daily. 1 each 11   Cholecalciferol (VITAMIN D3) 125 MCG (5000 UT) TABS Take 5,000 Units by mouth daily.     diltiazem (CARDIZEM CD) 240 MG 24 hr capsule TAKE 1 CAPSULE BY MOUTH EVERY DAY 90 capsule 2   furosemide (LASIX) 20 MG tablet TAKE 1 AND 1/2 TABLETS DAILY BY MOUTH 135 tablet 3   glucose blood (CONTOUR NEXT TEST) test strip 1 EACH BY IN VITRO ROUTE IN THE MORNING, AT NOON, AND AT BEDTIME. MAY SUBSTITUTE TO ANY MANUFACTURER COVERED BY PATIENT'S INSURANCE. 300 strip 1   ipratropium-albuterol (DUONEB) 0.5-2.5 (3) MG/3ML SOLN Take 3 mLs by nebulization every 3 (three) hours as needed. 360 mL 11   lidocaine-prilocaine (EMLA) cream APPLY TO AFFECTED AREA ONCE AS DIRECTED 30 g 3   montelukast (SINGULAIR) 10 MG tablet TAKE 1 TABLET (10 MG TOTAL) BY MOUTH AT BEDTIME. FOR ALLERGIES 90 tablet 0   OXYGEN Inhale 4 L/min into the lungs continuous.     potassium chloride (KLOR-CON M10) 10 MEQ tablet TAKE 1 TABLET BY MOUTH EVERY DAY 90 tablet 0   rosuvastatin (CRESTOR) 5 MG tablet TAKE 1 TABLET BY MOUTH EVERY DAY FOR CHOLESTEROL 90 tablet 0   Spacer/Aero-Holding Chambers (  AEROCHAMBER MV) inhaler Use as instructed 1 each 0   [DISCONTINUED] prochlorperazine (COMPAZINE) 10 MG tablet Take 1 tablet (10 mg total) by mouth every 6 (six) hours as needed (Nausea or  vomiting). 30 tablet 1   No current facility-administered medications on file prior to visit.    BP 110/66   Pulse 88   Temp 98.2 F (36.8 C) (Temporal)   Ht 5\' 3"  (1.6 m)   Wt 163 lb 9.6 oz (74.2 kg)   SpO2 96%   BMI 28.98 kg/m  Objective:   Physical Exam HENT:     Right Ear: Tympanic membrane and ear canal normal.     Left Ear: Tympanic membrane and ear canal normal.     Nose: Nose normal.  Eyes:     Conjunctiva/sclera: Conjunctivae normal.     Pupils: Pupils are equal, round, and reactive to light.  Neck:     Thyroid: No thyromegaly.  Cardiovascular:     Rate and Rhythm: Normal rate and regular rhythm.     Heart sounds: No murmur heard. Pulmonary:     Effort: Pulmonary effort is normal.     Breath sounds: Examination of the left-upper field reveals rhonchi. Examination of the right-lower field reveals rhonchi. Examination of the left-lower field reveals wheezing and rhonchi. Wheezing and rhonchi present. No rales.  Abdominal:     General: Bowel sounds are normal.     Palpations: Abdomen is soft.     Tenderness: There is no abdominal tenderness.  Musculoskeletal:        General: Normal range of motion.     Cervical back: Neck supple.  Lymphadenopathy:     Cervical: No cervical adenopathy.  Skin:    General: Skin is warm and dry.     Findings: No rash.  Neurological:     Mental Status: She is alert and oriented to person, place, and time.     Cranial Nerves: No cranial nerve deficit.     Deep Tendon Reflexes: Reflexes are normal and symmetric.  Psychiatric:        Mood and Affect: Mood normal.           Assessment & Plan:  Preventative health care Assessment & Plan: Prevnar 20 provided today. Pap smear overdue, she declines despite recommendations Mammogram up-to-date. Colonoscopy overdue, referral placed to GI.  Discussed the importance of a healthy diet and regular exercise in order for weight loss, and to reduce the risk of further  co-morbidity.  Exam stable. Labs pending.  Follow up in 1 year for repeat physical.    OSA (obstructive sleep apnea) Assessment & Plan: Non compliant to CPAP machine. Following with pulmonology.   Chronic diastolic heart failure (HCC) Assessment & Plan: Appears euvolemic today. Following with cardiology.  Continue furosemide 30 mg daily with potassium chloride 10 mEq daily, continue diltiazem 240 mg daily.  Reviewed labs from May 2024.   Type 2 diabetes mellitus with hyperglycemia, without long-term current use of insulin (HCC) Assessment & Plan: Repeat A1c pending.  She would like to increase the dose of her Ozempic given weight loss benefits. Increase Ozempic to 1 mg weekly.  Follow-up in 3 to 6 months based on A1c result.  Orders: -     Hemoglobin A1c -     Semaglutide (1 MG/DOSE); Inject 1 mg as directed once a week. for diabetes.  Dispense: 9 mL; Refill: 0  Hyperlipidemia, unspecified hyperlipidemia type Assessment & Plan: Repeat lipid panel pending.  Continue rosuvastatin 5 mg daily.  Orders: -     Lipid panel  Primary hypertension Assessment & Plan: Controlled.  Continue diltiazem to 40 mg daily   Chronic respiratory failure with hypoxia [facility]) Assessment & Plan: Follow with pulmonology.  Continue supplemental oxygen. Continue pulmonary rehab.   Screening for colon cancer -     Ambulatory referral to Gastroenterology  Immunization due -     Pneumococcal conjugate vaccine 20-valent  Centrilobular emphysema (HCC) Assessment & Plan: Stable.  Following with pulmonology. Continue Breztri 160-9-4.8 mcg, daily. Continue albuterol inhaler as needed. Continue supplemental oxygen.  Reviewed CT chest from May 2024. Continue pulmonary rehab.  Prevnar 20 vaccine provided today.   Primary malignant neoplasm of bronchus of left lower lobe [facility]) Assessment & Plan: Following with oncology and pulmonology. No longer receiving  treatment.  Reviewed CT chest from May 2024.   Depression, unspecified depression type Assessment & Plan: Denies concerns today. Continue to monitor.   Malignant neoplasm of upper-outer quadrant of left breast in female, estrogen receptor negative (HCC) Assessment & Plan: Following with oncology.  Continue anastrozole 1 mg daily. Mammogram up-to-date.         Doreene Nest, NP

## 2023-02-28 NOTE — Assessment & Plan Note (Signed)
Denies concerns today. °Continue to monitor. °

## 2023-02-28 NOTE — Assessment & Plan Note (Signed)
Repeat lipid panel pending. Continue rosuvastatin 5 mg daily.

## 2023-02-28 NOTE — Assessment & Plan Note (Signed)
Follow with pulmonology.  Continue supplemental oxygen. Continue pulmonary rehab.

## 2023-02-28 NOTE — Assessment & Plan Note (Signed)
Controlled.  Continue diltiazem to 40 mg daily

## 2023-02-28 NOTE — Assessment & Plan Note (Addendum)
Stable.  Following with pulmonology. Continue Breztri 160-9-4.8 mcg, daily. Continue albuterol inhaler as needed. Continue supplemental oxygen.  Reviewed CT chest from May 2024. Continue pulmonary rehab.  Prevnar 20 vaccine provided today.

## 2023-02-28 NOTE — Assessment & Plan Note (Signed)
Following with oncology.  Continue anastrozole 1 mg daily. Mammogram up-to-date.

## 2023-03-01 ENCOUNTER — Encounter (HOSPITAL_COMMUNITY)
Admission: RE | Admit: 2023-03-01 | Discharge: 2023-03-01 | Disposition: A | Discharge status: Home / Self Care | Payer: BC Managed Care – PPO | Source: Ambulatory Visit | Attending: Pulmonary Disease | Admitting: Pulmonary Disease

## 2023-03-01 ENCOUNTER — Ambulatory Visit (HOSPITAL_COMMUNITY): Payer: BC Managed Care – PPO

## 2023-03-01 DIAGNOSIS — J984 Other disorders of lung: Secondary | ICD-10-CM | POA: Diagnosis not present

## 2023-03-01 NOTE — Progress Notes (Signed)
Daily Session Note  Patient Details  Name: Deborah [name] MRN: [id number] Date of Birth: 1955-12-29 Referring Provider:   Doristine Devoid Pulmonary Rehab Walk Test from 02/12/2023 in [facility]  Referring Provider Dr. Judeth Horn       Encounter Date: 03/01/2023  Check In:  Session Check In - 03/01/23 1330       Check-In   Supervising physician immediately available to respond to emergencies [facility] MD immediately available    Physician(s) Edd Fabian, NP    Location [facility]    Staff Present Raford Pitcher, MS, ACSM-CEP, Exercise Physiologist;Randi Dionisio Paschal, ACSM-CEP, Exercise Physiologist;Samantha Belarus, RD, Dutch Gray, RN, Fuller Plan, RT    Virtual Visit No    Medication changes reported     No    Fall or balance concerns reported    Yes    Comments no cane or walker use at home, states she passes out at times    Tobacco Cessation No Change    Warm-up and Cool-down Performed as group-led instruction    Resistance Training Performed Yes    VAD Patient? No    PAD/SET Patient? No      Pain Assessment   Currently in Pain? No/denies    Multiple Pain Sites No             Capillary Blood Glucose: No results found for this or any previous visit (from the past 24 hour(s)).    Social History   Tobacco Use  Smoking Status Former   Current packs/day: 0.00   Average packs/day: 3.0 packs/day for 43.0 years (129.0 ttl pk-yrs)   Types: Cigarettes   Start date: 12/17/1969   Quit date: 12/17/2012   Years since quitting: 10.2  Smokeless Tobacco Never    Goals Met:  Proper associated with RPD/PD & O2 Sat Independence with exercise equipment Exercise tolerated well No report of concerns or symptoms today Strength training completed today  Goals Unmet:  Not Applicable  Comments: Service time is from 1311 to 1445.    Dr. Mechele Collin is Medical Director for Pulmonary Rehab at [facility]

## 2023-03-06 ENCOUNTER — Ambulatory Visit (HOSPITAL_COMMUNITY): Payer: BC Managed Care – PPO

## 2023-03-06 ENCOUNTER — Encounter (HOSPITAL_COMMUNITY)
Admission: RE | Admit: 2023-03-06 | Discharge: 2023-03-06 | Disposition: A | Discharge status: Home / Self Care | Payer: BC Managed Care – PPO | Source: Ambulatory Visit | Attending: Pulmonary Disease | Admitting: Pulmonary Disease

## 2023-03-06 VITALS — Wt 163.1 lb

## 2023-03-06 DIAGNOSIS — J984 Other disorders of lung: Secondary | ICD-10-CM | POA: Diagnosis not present

## 2023-03-06 NOTE — Progress Notes (Signed)
Daily Session Note  Patient Details  Name: Deborah [name] MRN: [id number] Date of Birth: 05-23-1956 Referring Provider:   Doristine Devoid Pulmonary Rehab Walk Test from 02/12/2023 in [facility]  Referring Provider Dr. Judeth Horn       Encounter Date: 03/06/2023  Check In:  Session Check In - 03/06/23 1503       Check-In   Supervising physician immediately available to respond to emergencies [facility] MD immediately available    Physician(s) Edd Fabian, NP    [facility]    Staff Present Raford Pitcher, MS, ACSM-CEP, Exercise Physiologist;Samantha Belarus, RD, Dutch Gray, RN, Fuller Plan, RT    Virtual Visit No    Medication changes reported     No    Fall or balance concerns reported    Yes    Comments no cane or walker use at home, states she passes out at times    Tobacco Cessation No Change    Warm-up and Cool-down Performed as group-led instruction    Resistance Training Performed Yes    VAD Patient? No    PAD/SET Patient? No      Pain Assessment   Currently in Pain? No/denies    Multiple Pain Sites No             Capillary Blood Glucose: No results found for this or any previous visit (from the past 24 hour(s)).   Exercise Prescription Changes - 03/06/23 1500       Response to Exercise   Blood Pressure (Admit) 106/60    Blood Pressure (Exercise) 108/60    Blood Pressure (Exit) 98/62    Heart Rate (Admit) 112 bpm    Heart Rate (Exercise) 138 bpm    Heart Rate (Exit) 119 bpm    Oxygen Saturation (Admit) 96 %    Oxygen Saturation (Exercise) 93 %    Oxygen Saturation (Exit) 95 %    Rating of Perceived Exertion (Exercise) 19    Perceived Dyspnea (Exercise) 4    Duration Progress to 30 minutes of  aerobic without signs/symptoms of physical distress    Intensity THRR unchanged      Resistance Training   Training Prescription Yes    Weight red bands    Reps 10-15     Time 10 Minutes      Oxygen   Oxygen Continuous    Liters 2      NuStep   Level 2    Minutes 15    METs 1.9      Track   Laps 1    Minutes 15    METs 1.15             Social History   Tobacco Use  Smoking Status Former   Current packs/day: 0.00   Average packs/day: 3.0 packs/day for 43.0 years (129.0 ttl pk-yrs)   Types: Cigarettes   Start date: 12/17/1969   Quit date: 12/17/2012   Years since quitting: 10.2  Smokeless Tobacco Never    Goals Met:  Proper associated with RPD/PD & O2 Sat Exercise tolerated well No report of concerns or symptoms today Strength training completed today  Goals Unmet:  Not Applicable  Comments: Service time is from 1314 to 1432.    Dr. Mechele Collin is Medical Director for Pulmonary Rehab at [facility].

## 2023-03-08 ENCOUNTER — Encounter (HOSPITAL_COMMUNITY)
Admission: RE | Admit: 2023-03-08 | Discharge: 2023-03-08 | Disposition: A | Discharge status: Home / Self Care | Payer: BC Managed Care – PPO | Source: Ambulatory Visit | Attending: Pulmonary Disease | Admitting: Pulmonary Disease

## 2023-03-08 ENCOUNTER — Ambulatory Visit (HOSPITAL_COMMUNITY): Payer: BC Managed Care – PPO

## 2023-03-08 DIAGNOSIS — J984 Other disorders of lung: Secondary | ICD-10-CM | POA: Diagnosis not present

## 2023-03-08 NOTE — Progress Notes (Signed)
Daily Session Note  Patient Details  Name: Deborah [name] MRN: [id number] Date of Birth: 1955/08/13 Referring Provider:   Doristine Devoid Pulmonary Rehab Walk Test from 02/12/2023 in [facility]  Referring Provider Dr. Judeth Horn       Encounter Date: 03/08/2023  Check In:  Session Check In - 03/08/23 1323       Check-In   Supervising physician immediately available to respond to emergencies [facility] MD immediately available    Physician(s) Robin Searing, NP    [facility]    Staff Present Raford Pitcher, MS, ACSM-CEP, Exercise Physiologist;Samantha Belarus, RD, Dutch Gray, RN, Fuller Plan, RT    Virtual Visit No    Medication changes reported     No    Fall or balance concerns reported    Yes    Comments no cane or walker use at home, states she passes out at times    Tobacco Cessation No Change    Warm-up and Cool-down Performed as group-led instruction    Resistance Training Performed Yes    VAD Patient? No    PAD/SET Patient? No      Pain Assessment   Currently in Pain? No/denies    Multiple Pain Sites No             Capillary Blood Glucose: No results found for this or any previous visit (from the past 24 hour(s)).    Social History   Tobacco Use  Smoking Status Former   Current packs/day: 0.00   Average packs/day: 3.0 packs/day for 43.0 years (129.0 ttl pk-yrs)   Types: Cigarettes   Start date: 12/17/1969   Quit date: 12/17/2012   Years since quitting: 10.2  Smokeless Tobacco Never    Goals Met:  Proper associated with RPD/PD & O2 Sat Exercise tolerated well No report of concerns or symptoms today Strength training completed today  Goals Unmet:  Not Applicable  Comments: Service time is from 1312 to 1445.    Dr. Mechele Collin is Medical Director for Pulmonary Rehab at [facility].

## 2023-03-09 ENCOUNTER — Encounter: Payer: Self-pay | Admitting: Pulmonary Disease

## 2023-03-09 DIAGNOSIS — J432 Centrilobular emphysema: Secondary | ICD-10-CM

## 2023-03-12 ENCOUNTER — Telehealth: Payer: Self-pay | Admitting: *Deleted

## 2023-03-12 MED ORDER — IPRATROPIUM-ALBUTEROL 0.5-2.5 (3) MG/3ML IN SOLN
3.0000 mL | RESPIRATORY_TRACT | 11 refills | Status: AC | PRN
Start: 2023-03-12 — End: ?

## 2023-03-12 NOTE — Telephone Encounter (Signed)
Spoke with [facility] from Pulmonary Rehab, she is requesting that it be ok for her target HR to be increased from 125 to 150.  Advised I would get a note to Dr. Judeth Horn.  Dr. Judeth Horn, please advise.  Thank you.

## 2023-03-12 NOTE — Telephone Encounter (Signed)
Yes ok with HR increase - thanks!

## 2023-03-13 ENCOUNTER — Ambulatory Visit (HOSPITAL_COMMUNITY): Payer: BC Managed Care – PPO

## 2023-03-13 ENCOUNTER — Encounter (HOSPITAL_COMMUNITY)
Admission: RE | Admit: 2023-03-13 | Discharge: 2023-03-13 | Disposition: A | Discharge status: Home / Self Care | Payer: BC Managed Care – PPO | Source: Ambulatory Visit | Attending: Pulmonary Disease | Admitting: Pulmonary Disease

## 2023-03-13 DIAGNOSIS — J984 Other disorders of lung: Secondary | ICD-10-CM | POA: Diagnosis not present

## 2023-03-13 NOTE — Progress Notes (Signed)
Daily Session Note  Patient Details  Name: Deborah [name] MRN: [id number] Date of Birth: May 27, 1956 Referring Provider:   Doristine Devoid Pulmonary Rehab Walk Test from 02/12/2023 in [facility]  Referring Provider Dr. Judeth Horn       Encounter Date: 03/13/2023  Check In:  Session Check In - 03/13/23 1328       Check-In   Supervising physician immediately available to respond to emergencies [facility] MD immediately available    Physician(s) Carlyon Shadow, NP    [facility]    Staff Present Samantha Belarus, RD, Dutch Gray, RN, BSN;Randi Reeve BS, ACSM-CEP, Exercise Physiologist;Kaylee Earlene Plater, MS, ACSM-CEP, Exercise Physiologist;Laretha Luepke Katrinka Blazing, RT    Virtual Visit No    Medication changes reported     No    Fall or balance concerns reported    Yes    Comments no cane or walker use at home, states she passes out at times    Tobacco Cessation No Change    Warm-up and Cool-down Performed as group-led instruction    Resistance Training Performed Yes    VAD Patient? No    PAD/SET Patient? No      Pain Assessment   Currently in Pain? No/denies    Multiple Pain Sites No             Capillary Blood Glucose: No results found for this or any previous visit (from the past 24 hour(s)).    Social History   Tobacco Use  Smoking Status Former   Current packs/day: 0.00   Average packs/day: 3.0 packs/day for 43.0 years (129.0 ttl pk-yrs)   Types: Cigarettes   Start date: 12/17/1969   Quit date: 12/17/2012   Years since quitting: 10.2  Smokeless Tobacco Never    Goals Met:  Proper associated with RPD/PD & O2 Sat Independence with exercise equipment Exercise tolerated well No report of concerns or symptoms today Strength training completed today  Goals Unmet:  Not Applicable  Comments: Service time is from 1319 to 1444.    Dr. Mechele Collin is Medical Director for Pulmonary Rehab at  [facility].

## 2023-03-15 ENCOUNTER — Encounter (HOSPITAL_COMMUNITY)
Admission: RE | Admit: 2023-03-15 | Discharge: 2023-03-15 | Disposition: A | Discharge status: Home / Self Care | Payer: BC Managed Care – PPO | Source: Ambulatory Visit | Attending: Pulmonary Disease | Admitting: Pulmonary Disease

## 2023-03-15 ENCOUNTER — Ambulatory Visit (HOSPITAL_COMMUNITY): Payer: BC Managed Care – PPO

## 2023-03-15 DIAGNOSIS — J984 Other disorders of lung: Secondary | ICD-10-CM | POA: Diagnosis not present

## 2023-03-15 NOTE — Progress Notes (Signed)
Daily Session Note  Patient Details  Name: Deborah [name] MRN: [id number] Date of Birth: Aug 16, 1955 Referring Provider:   Doristine Devoid Pulmonary Rehab Walk Test from 02/12/2023 in [facility]  Referring Provider Dr. Judeth Horn       Encounter Date: 03/15/2023  Check In:  Session Check In - 03/15/23 1331       Check-In   Supervising physician immediately available to respond to emergencies [facility] MD immediately available    Physician(s) Bernadene Person, NP    Location [facility]    Staff Present Elissa Lovett BS, ACSM-CEP, Exercise Physiologist;Kaylee Earlene Plater, MS, ACSM-CEP, Exercise Physiologist;Amritpal Shropshire Thedore Mins, RN, BSN    Virtual Visit No    Medication changes reported     No    Fall or balance concerns reported    Yes    Comments no cane or walker use at home, states she passes out at times    Tobacco Cessation No Change    Warm-up and Cool-down Performed as group-led instruction    Resistance Training Performed Yes    VAD Patient? No    PAD/SET Patient? No      Pain Assessment   Currently in Pain? No/denies    Multiple Pain Sites No             Capillary Blood Glucose: No results found for this or any previous visit (from the past 24 hour(s)).    Social History   Tobacco Use  Smoking Status Former   Current packs/day: 0.00   Average packs/day: 3.0 packs/day for 43.0 years (129.0 ttl pk-yrs)   Types: Cigarettes   Start date: 12/17/1969   Quit date: 12/17/2012   Years since quitting: 10.2  Smokeless Tobacco Never    Goals Met:  Proper associated with RPD/PD & O2 Sat Independence with exercise equipment Exercise tolerated well No report of concerns or symptoms today Strength training completed today  Goals Unmet:  Not Applicable  Comments: Service time is from 1310 to 1459.    Dr. Mechele Collin is Medical Director for Pulmonary Rehab at [facility].

## 2023-03-20 ENCOUNTER — Ambulatory Visit (HOSPITAL_COMMUNITY): Payer: BC Managed Care – PPO

## 2023-03-20 ENCOUNTER — Encounter (HOSPITAL_COMMUNITY)
Admission: RE | Admit: 2023-03-20 | Discharge: 2023-03-20 | Disposition: A | Discharge status: Home / Self Care | Payer: BC Managed Care – PPO | Source: Ambulatory Visit | Attending: Pulmonary Disease | Admitting: Pulmonary Disease

## 2023-03-20 VITALS — Wt 163.1 lb

## 2023-03-20 DIAGNOSIS — Z87891 Personal history of nicotine dependence: Secondary | ICD-10-CM | POA: Diagnosis not present

## 2023-03-20 DIAGNOSIS — J984 Other disorders of lung: Secondary | ICD-10-CM | POA: Insufficient documentation

## 2023-03-20 DIAGNOSIS — I499 Cardiac arrhythmia, unspecified: Secondary | ICD-10-CM | POA: Insufficient documentation

## 2023-03-20 DIAGNOSIS — R9431 Abnormal electrocardiogram [ECG] [EKG]: Secondary | ICD-10-CM | POA: Insufficient documentation

## 2023-03-20 NOTE — Progress Notes (Signed)
Daily Session Note  Patient Details  Name: Deborah [name] MRN: [id number] Date of Birth: 1956/07/16 Referring Provider:   Doristine Devoid Pulmonary Rehab Walk Test from 02/12/2023 in [facility]  Referring Provider Dr. Judeth Horn       Encounter Date: 03/20/2023  Check In:  Session Check In - 03/20/23 1332       Check-In   Supervising physician immediately available to respond to emergencies [facility] MD immediately available    Physician(s) Bernadene Person, NP    Location [facility]    Staff Present Elissa Lovett BS, ACSM-CEP, Exercise Physiologist;Ahron Hulbert Earlene Plater, MS, ACSM-CEP, Exercise Physiologist;Casey Synthia Innocent, RN, BSN    Virtual Visit No    Medication changes reported     No    Fall or balance concerns reported    No    Comments no cane or walker use at home, states she passes out at times    Tobacco Cessation No Change    Warm-up and Cool-down Performed as group-led instruction    Resistance Training Performed Yes    VAD Patient? No    PAD/SET Patient? No      Pain Assessment   Currently in Pain? No/denies    Multiple Pain Sites No             Capillary Blood Glucose: No results found for this or any previous visit (from the past 24 hour(s)).   Exercise Prescription Changes - 03/20/23 1500       Response to Exercise   Blood Pressure (Admit) 110/60    Blood Pressure (Exercise) 120/60    Blood Pressure (Exit) 91/62    Heart Rate (Admit) 108 bpm    Heart Rate (Exercise) 155 bpm    Heart Rate (Exit) 122 bpm    Oxygen Saturation (Admit) 98 %    Oxygen Saturation (Exercise) 91 %    Oxygen Saturation (Exit) 97 %    Rating of Perceived Exertion (Exercise) 19    Perceived Dyspnea (Exercise) 3    Duration Progress to 30 minutes of  aerobic without signs/symptoms of physical distress    Intensity THRR unchanged      Progression   Progression Continue to progress workloads to maintain  intensity without signs/symptoms of physical distress.      Resistance Training   Training Prescription Yes    Weight red bands    Reps 10-15    Time 10 Minutes      Oxygen   Oxygen Continuous    Liters 2      NuStep   Level 3    Minutes 15    METs 2.6      Track   Laps 2.25    Minutes 15    METs 1.35      Oxygen   Maintain Oxygen Saturation 88% or higher             Social History   Tobacco Use  Smoking Status Former   Current packs/day: 0.00   Average packs/day: 3.0 packs/day for 43.0 years (129.0 ttl pk-yrs)   Types: Cigarettes   Start date: 12/17/1969   Quit date: 12/17/2012   Years since quitting: 10.2  Smokeless Tobacco Never    Goals Met:  Proper associated with RPD/PD & O2 Sat Independence with exercise equipment Exercise tolerated well No report of concerns or symptoms today Strength training completed today  Goals Unmet:  Not Applicable  Comments: Service time  is from 1323 to 1452.    Dr. Mechele Collin is Medical Director for Pulmonary Rehab at [facility].

## 2023-03-21 NOTE — Progress Notes (Signed)
Pulmonary Individual Treatment Plan  Patient Details  Name: Deborah [name] MRN: [id number] Date of Birth: [birth year] Referring Provider:   Doristine Devoid Pulmonary Rehab Walk Test from 02/12/2023 in [facility]  Referring Provider Dr. Judeth Horn       Initial Encounter Date:  Flowsheet Row Pulmonary Rehab Walk Test from 02/12/2023 in [facility]  Date 02/12/23       Visit Diagnosis: Restrictive lung disease  Patient's Home Medications on Admission:   Current Outpatient Medications:    acetaminophen (TYLENOL) 325 MG tablet, Take 650 mg by mouth every 6 (six) hours as needed for headache (pain)., Disp: , Rfl:    albuterol (VENTOLIN HFA) 108 (90 Base) MCG/ACT inhaler, Inhale 2 puffs into the lungs every 6 (six) hours as needed for wheezing or shortness of breath., Disp: 8 g, Rfl: 3   anastrozole (ARIMIDEX) 1 MG tablet, Take 1 tablet (1 mg total) by mouth daily., Disp: 90 tablet, Rfl: 3   aspirin 81 MG chewable tablet, Chew 1 tablet (81 mg total) by mouth daily., Disp: 30 tablet, Rfl: 1   Blood Glucose Monitoring Suppl DEVI, 1 each by Does not apply route in the morning, at noon, and at bedtime. May substitute to any manufacturer covered by patient's insurance., Disp: 1 each, Rfl: 0   Budeson-Glycopyrrol-Formoterol (BREZTRI AEROSPHERE) 160-9-4.8 MCG/ACT AERO, Inhale 2 puffs into the lungs 2 (two) times daily., Disp: 1 each, Rfl: 11   Cholecalciferol (VITAMIN D3) 125 MCG (5000 UT) TABS, Take 5,000 Units by mouth daily., Disp: , Rfl:    diltiazem (CARDIZEM CD) 240 MG 24 hr capsule, TAKE 1 CAPSULE BY MOUTH EVERY DAY, Disp: 90 capsule, Rfl: 2   furosemide (LASIX) 20 MG tablet, TAKE 1 AND 1/2 TABLETS DAILY BY MOUTH, Disp: 135 tablet, Rfl: 3   glucose blood (CONTOUR NEXT TEST) test strip, 1 EACH BY IN VITRO ROUTE IN THE MORNING, AT NOON, AND AT BEDTIME. MAY SUBSTITUTE TO ANY  MANUFACTURER COVERED BY PATIENT'S INSURANCE., Disp: 300 strip, Rfl: 1   ipratropium-albuterol (DUONEB) 0.5-2.5 (3) MG/3ML SOLN, Take 3 mLs by nebulization every 4 (four) hours as needed., Disp: 360 mL, Rfl: 11   lidocaine-prilocaine (EMLA) cream, APPLY TO AFFECTED AREA ONCE AS DIRECTED, Disp: 30 g, Rfl: 3   montelukast (SINGULAIR) 10 MG tablet, TAKE 1 TABLET (10 MG TOTAL) BY MOUTH AT BEDTIME. FOR ALLERGIES, Disp: 90 tablet, Rfl: 0   OXYGEN, Inhale 4 L/min into the lungs continuous., Disp: , Rfl:    potassium chloride (KLOR-CON M10) 10 MEQ tablet, TAKE 1 TABLET BY MOUTH EVERY DAY, Disp: 90 tablet, Rfl: 0   rosuvastatin (CRESTOR) 5 MG tablet, TAKE 1 TABLET BY MOUTH EVERY DAY FOR CHOLESTEROL, Disp: 90 tablet, Rfl: 0   Semaglutide, 1 MG/DOSE, 4 MG/3ML SOPN, Inject 1 mg as directed once a week. for diabetes., Disp: 9 mL, Rfl: 0   Spacer/Aero-Holding Chambers (AEROCHAMBER MV) inhaler, Use as instructed, Disp: 1 each, Rfl: 0  Past Medical History: Past Medical History:  Diagnosis Date   Acute hypoxemic respiratory failure (HCC)    Acute on chronic respiratory failure with hypoxia (HCC) 05/24/2020   Anemia    as teen   Asthma    Breast cancer (HCC)    left breast cancer 02/2020   CHF (congestive heart failure) (HCC)    CHF, acute on chronic, unknown EF 07/25/2014   COPD exacerbation (HCC) 12/18/2015   COPD,  mild (HCC)    Depression    Diabetes mellitus without complication (HCC)    Diverticulitis    Diverticulosis of colon with hemorrhage    Dyspnea 04/17/2013   Followed in Pulmonary clinic/ [facility]/ [facility]  - hfa 75% p coaching 04/17/2013   - PFT's 06/05/2013 no airflow obst, dlco 60 corrects to 96%     Family history of anesthesia complication    vomiting   GI bleeding    Heart failure (HCC)    New onset 07/25/14   Histoplasmosis    left eye   Hyperkalemia    Hypertension    Lung cancer (HCC) 05/02/2018   s/p chemoradiation, immunotherapy   Obesity (BMI 30-39.9)    Pneumonia     dx wtih pneumonia on 05/27/16- seen by [facility]    PONV (postoperative nausea and vomiting)    Restrictive lung disease    Severe sepsis (HCC)    Shortness of breath    with exertion    Sleep apnea    mask and oxygen at nite for sleep at 2L    Tobacco abuse    Umbilical hernia     Tobacco Use: Social History   Tobacco Use  Smoking Status Former   Current packs/day: 0.00   Average packs/day: 3.0 packs/day for 43.0 years (129.0 ttl pk-yrs)   Types: Cigarettes   Start date: 12/17/1969   Quit date: 12/17/2012   Years since quitting: 10.2  Smokeless Tobacco Never    Labs: Review Flowsheet  More data exists      Latest Ref Rng & Units 02/02/2022 05/30/2022 08/08/2022 11/28/2022 02/28/2023  Labs for ITP Cardiac and Pulmonary Rehab  Cholestrol 0 - 200 mg/dL 841  - - - 660   LDL (calc) 0 - 99 mg/dL 59  - - - 45   HDL-C >63.01 mg/dL 60.10  - - - 93.23   Trlycerides 0.0 - 149.0 mg/dL 557.3  - - - 220.2   Hemoglobin A1c 4.6 - 6.5 % 6.5  5.5  5.3  5.8  5.4     Details            Capillary Blood Glucose: Lab Results  Component Value Date   GLUCAP 132 (H) 02/27/2023   GLUCAP 98 02/27/2023   GLUCAP 132 (H) 02/20/2023   GLUCAP 105 (H) 02/20/2023   GLUCAP 119 (H) 02/12/2023    POCT Glucose     Row Name 02/12/23 0930             POCT Blood Glucose   Pre-Exercise 119 mg/dL                Pulmonary Assessment Scores:  Pulmonary Assessment Scores     Row Name 02/12/23 0925         ADL UCSD   SOB Score total 81       CAT Score   CAT Score 23       mMRC Score   mMRC Score 4             UCSD: Self-administered rating of dyspnea associated with activities of daily living (ADLs) 6-point scale (0 = "not at all" to 5 = "maximal or unable to do because of breathlessness")  Scoring Scores range from 0 to 120.  Minimally important difference is 5 units  CAT: CAT can identify the health impairment of COPD patients and is better correlated with disease  progression.  CAT has a scoring range of zero to 40. The CAT  score is classified into four groups of low (less than 10), medium (10 - 20), high (21-30) and very high (31-40) based on the impact level of disease on health status. A CAT score over 10 suggests significant symptoms.  A worsening CAT score could be explained by an exacerbation, poor medication adherence, poor inhaler technique, or progression of COPD or comorbid conditions.  CAT MCID is 2 points  mMRC: mMRC (Modified Medical Research Council) Dyspnea Scale is used to assess the degree of baseline functional disability in patients of respiratory disease due to dyspnea. No minimal important difference is established. A decrease in score of 1 point or greater is considered a positive change.   Pulmonary Function Assessment:  Pulmonary Function Assessment - 02/12/23 0924       Breath   Bilateral Breath Sounds Decreased;Rhonchi;Expiratory;Wheezes    Shortness of Breath Yes;Limiting activity             Exercise Target Goals: Exercise Program Goal: Individual exercise prescription set using results from initial 6 min walk test and THRR while considering  patient's activity barriers and safety.   Exercise Prescription Goal: Initial exercise prescription builds to 30-45 minutes a day of aerobic activity, 2-3 days per week.  Home exercise guidelines will be given to patient during program as part of exercise prescription that the participant will acknowledge.  Activity Barriers & Risk Stratification:  Activity Barriers & Cardiac Risk Stratification - 02/12/23 0925       Activity Barriers & Cardiac Risk Stratification   Activity Barriers Deconditioning;Muscular Weakness;Shortness of Breath;Back Problems             6 Minute Walk:  6 Minute Walk     Row Name 02/12/23 1021         6 Minute Walk   Phase Initial     Distance 285 feet     Walk Time 6 minutes     # of Rest Breaks 2  stopped at 1:35-2:14 and 3:38-5:17      MPH 0.54     METS 1.05     RPE 16     Perceived Dyspnea  3     Symptoms Yes (comment)     Comments SOB     Resting HR 104 bpm     Resting BP 110/62     Resting Oxygen Saturation  97 %     Exercise Oxygen Saturation  during 6 min walk 96 %     Max Ex. HR 153 bpm     Max Ex. BP 124/68     2 Minute Post BP 112/62       Interval HR   1 Minute HR 138     2 Minute HR 138     3 Minute HR 150     4 Minute HR 153     5 Minute HR 118     6 Minute HR 145     Interval Heart Rate? Yes       Interval Oxygen   Interval Oxygen? Yes     Baseline Oxygen Saturation % 97 %     1 Minute Oxygen Saturation % 96 %     1 Minute Liters of Oxygen 2 L     2 Minute Oxygen Saturation % 96 %     2 Minute Liters of Oxygen 2 L     3 Minute Oxygen Saturation % 96 %     3 Minute Liters of Oxygen 2 L  4 Minute Oxygen Saturation % 96 %     4 Minute Liters of Oxygen 2 L     5 Minute Oxygen Saturation % 96 %     5 Minute Liters of Oxygen 2 L     6 Minute Oxygen Saturation % 97 %     6 Minute Liters of Oxygen 2 L     2 Minute Post Oxygen Saturation % 98 %     2 Minute Post Liters of Oxygen 2 L              Oxygen Initial Assessment:  Oxygen Initial Assessment - 02/12/23 0924       Home Oxygen   Home Oxygen Device Home Concentrator;E-Tanks    Sleep Oxygen Prescription Continuous    Liters per minute 2    Home Exercise Oxygen Prescription Continuous    Liters per minute 5    Home Resting Oxygen Prescription Continuous    Liters per minute 2    Compliance with Home Oxygen Use Yes      Initial 6 min Walk   Oxygen Used Continuous;E-Tanks    Liters per minute 2      Program Oxygen Prescription   Program Oxygen Prescription Continuous;E-Tanks    Liters per minute 2      Intervention   Short Term Goals To learn and exhibit compliance with exercise, home and travel O2 prescription;To learn and understand importance of maintaining oxygen saturations>88%;To learn and demonstrate proper use  of respiratory medications;To learn and understand importance of monitoring SPO2 with pulse oximeter and demonstrate accurate use of the pulse oximeter.;To learn and demonstrate proper pursed lip breathing techniques or other breathing techniques.     Long  Term Goals Exhibits compliance with exercise, home  and travel O2 prescription;Maintenance of O2 saturations>88%;Compliance with respiratory medication;Verbalizes importance of monitoring SPO2 with pulse oximeter and return demonstration;Exhibits proper breathing techniques, such as pursed lip breathing or other method taught during program session;Demonstrates proper use of MDI's             Oxygen Re-Evaluation:  Oxygen Re-Evaluation     Row Name 02/16/23 1534 03/13/23 0859           Program Oxygen Prescription   Program Oxygen Prescription Continuous;E-Tanks Continuous;E-Tanks      Liters per minute 2 2        Home Oxygen   Home Oxygen Device Home Concentrator;E-Tanks Home Concentrator;E-Tanks      Sleep Oxygen Prescription Continuous Continuous      Liters per minute 2 2      Home Exercise Oxygen Prescription Continuous Continuous      Liters per minute 5 5      Home Resting Oxygen Prescription Continuous Continuous      Liters per minute 2 2      Compliance with Home Oxygen Use Yes Yes        Goals/Expected Outcomes   Short Term Goals To learn and exhibit compliance with exercise, home and travel O2 prescription;To learn and understand importance of maintaining oxygen saturations>88%;To learn and demonstrate proper use of respiratory medications;To learn and understand importance of monitoring SPO2 with pulse oximeter and demonstrate accurate use of the pulse oximeter.;To learn and demonstrate proper pursed lip breathing techniques or other breathing techniques.  To learn and exhibit compliance with exercise, home and travel O2 prescription;To learn and understand importance of maintaining oxygen saturations>88%;To learn and  demonstrate proper use of respiratory medications;To learn and understand importance of monitoring SPO2  with pulse oximeter and demonstrate accurate use of the pulse oximeter.;To learn and demonstrate proper pursed lip breathing techniques or other breathing techniques.       Long  Term Goals Exhibits compliance with exercise, home  and travel O2 prescription;Maintenance of O2 saturations>88%;Compliance with respiratory medication;Verbalizes importance of monitoring SPO2 with pulse oximeter and return demonstration;Exhibits proper breathing techniques, such as pursed lip breathing or other method taught during program session;Demonstrates proper use of MDI's Exhibits compliance with exercise, home  and travel O2 prescription;Maintenance of O2 saturations>88%;Compliance with respiratory medication;Verbalizes importance of monitoring SPO2 with pulse oximeter and return demonstration;Exhibits proper breathing techniques, such as pursed lip breathing or other method taught during program session;Demonstrates proper use of MDI's      Goals/Expected Outcomes Compliance and understanding of oxygen saturation monitoring and breathing techniques to decrease shortness of breath Compliance and understanding of oxygen saturation monitoring and breathing techniques to decrease shortness of breath               Oxygen Discharge (Final Oxygen Re-Evaluation):  Oxygen Re-Evaluation - 03/13/23 0859       Program Oxygen Prescription   Program Oxygen Prescription Continuous;E-Tanks    Liters per minute 2      Home Oxygen   Home Oxygen Device Home Concentrator;E-Tanks    Sleep Oxygen Prescription Continuous    Liters per minute 2    Home Exercise Oxygen Prescription Continuous    Liters per minute 5    Home Resting Oxygen Prescription Continuous    Liters per minute 2    Compliance with Home Oxygen Use Yes      Goals/Expected Outcomes   Short Term Goals To learn and exhibit compliance with exercise, home and  travel O2 prescription;To learn and understand importance of maintaining oxygen saturations>88%;To learn and demonstrate proper use of respiratory medications;To learn and understand importance of monitoring SPO2 with pulse oximeter and demonstrate accurate use of the pulse oximeter.;To learn and demonstrate proper pursed lip breathing techniques or other breathing techniques.     Long  Term Goals Exhibits compliance with exercise, home  and travel O2 prescription;Maintenance of O2 saturations>88%;Compliance with respiratory medication;Verbalizes importance of monitoring SPO2 with pulse oximeter and return demonstration;Exhibits proper breathing techniques, such as pursed lip breathing or other method taught during program session;Demonstrates proper use of MDI's    Goals/Expected Outcomes Compliance and understanding of oxygen saturation monitoring and breathing techniques to decrease shortness of breath             Initial Exercise Prescription:  Initial Exercise Prescription - 02/12/23 1000       Date of Initial Exercise RX and Referring Provider   Date 02/12/23    Referring Provider Dr. Judeth Horn    Expected Discharge Date 05/10/23      Oxygen   Oxygen Continuous    Liters 2    Maintain Oxygen Saturation 88% or higher      NuStep   Level 1    SPM 40    Minutes 30    METs 1.2      Prescription Details   Frequency (times per week) 2    Duration Progress to 30 minutes of continuous aerobic without signs/symptoms of physical distress      Intensity   THRR 40-80% of Max Heartrate 62-125    Ratings of Perceived Exertion 11-13    Perceived Dyspnea 0-4      Progression   Progression Continue to progress workloads to maintain intensity without signs/symptoms of physical distress.  Resistance Training   Training Prescription Yes    Weight red bands    Reps 10-15             Perform Capillary Blood Glucose checks as needed.  Exercise Prescription Changes:    Exercise Prescription Changes     Row Name 02/20/23 1500 03/06/23 1500 03/20/23 1500         Response to Exercise   Blood Pressure (Admit) 108/64 106/60 110/60     Blood Pressure (Exercise) 116/66 108/60 120/60     Blood Pressure (Exit) 90/50 98/62 91/62      Heart Rate (Admit) 110 bpm 112 bpm 108 bpm     Heart Rate (Exercise) 130 bpm 138 bpm 155 bpm     Heart Rate (Exit) 124 bpm 119 bpm 122 bpm     Oxygen Saturation (Admit) 94 % 96 % 98 %     Oxygen Saturation (Exercise) 91 % 93 % 91 %     Oxygen Saturation (Exit) 96 % 95 % 97 %     Rating of Perceived Exertion (Exercise) 13 19 19      Perceived Dyspnea (Exercise) 1 4 3      Duration Progress to 30 minutes of  aerobic without signs/symptoms of physical distress Progress to 30 minutes of  aerobic without signs/symptoms of physical distress Progress to 30 minutes of  aerobic without signs/symptoms of physical distress     Intensity THRR unchanged THRR unchanged THRR unchanged       Progression   Progression -- -- Continue to progress workloads to maintain intensity without signs/symptoms of physical distress.       Resistance Training   Training Prescription Yes Yes Yes     Weight red bands red bands red bands     Reps 10-15 10-15 10-15     Time 10 Minutes 10 Minutes 10 Minutes       Oxygen   Oxygen Continuous Continuous Continuous     Liters 2 2 2        NuStep   Level 1 2 3      Minutes 30 15 15      METs 1.9 1.9 2.6       Track   Laps -- 1 2.25     Minutes -- 15 15     METs -- 1.15 1.35       Oxygen   Maintain Oxygen Saturation 88% or higher -- 88% or higher              Exercise Comments:   Exercise Goals and Review:   Exercise Goals     Row Name 02/12/23 0920 02/16/23 1533 03/13/23 0853         Exercise Goals   Increase Physical Activity Yes Yes Yes     Intervention Provide advice, education, support and counseling about physical activity/exercise needs.;Develop an individualized exercise prescription  for aerobic and resistive training based on initial evaluation findings, risk stratification, comorbidities and participant's personal goals. Provide advice, education, support and counseling about physical activity/exercise needs.;Develop an individualized exercise prescription for aerobic and resistive training based on initial evaluation findings, risk stratification, comorbidities and participant's personal goals. Provide advice, education, support and counseling about physical activity/exercise needs.;Develop an individualized exercise prescription for aerobic and resistive training based on initial evaluation findings, risk stratification, comorbidities and participant's personal goals.     Expected Outcomes Short Term: Attend rehab on a regular basis to increase amount of physical activity.;Long Term: Exercising regularly at least 3-5 days a week.;Long  Term: Add in home exercise to make exercise part of routine and to increase amount of physical activity. Short Term: Attend rehab on a regular basis to increase amount of physical activity.;Long Term: Exercising regularly at least 3-5 days a week.;Long Term: Add in home exercise to make exercise part of routine and to increase amount of physical activity. Short Term: Attend rehab on a regular basis to increase amount of physical activity.;Long Term: Exercising regularly at least 3-5 days a week.;Long Term: Add in home exercise to make exercise part of routine and to increase amount of physical activity.     Increase Strength and Stamina Yes Yes Yes     Intervention Provide advice, education, support and counseling about physical activity/exercise needs.;Develop an individualized exercise prescription for aerobic and resistive training based on initial evaluation findings, risk stratification, comorbidities and participant's personal goals. Provide advice, education, support and counseling about physical activity/exercise needs.;Develop an individualized  exercise prescription for aerobic and resistive training based on initial evaluation findings, risk stratification, comorbidities and participant's personal goals. Provide advice, education, support and counseling about physical activity/exercise needs.;Develop an individualized exercise prescription for aerobic and resistive training based on initial evaluation findings, risk stratification, comorbidities and participant's personal goals.     Expected Outcomes Short Term: Increase workloads from initial exercise prescription for resistance, speed, and METs.;Short Term: Perform resistance training exercises routinely during rehab and add in resistance training at home;Long Term: Improve cardiorespiratory fitness, muscular endurance and strength as measured by increased METs and functional capacity ( ) Short Term: Increase workloads from initial exercise prescription for resistance, speed, and METs.;Short Term: Perform resistance training exercises routinely during rehab and add in resistance training at home;Long Term: Improve cardiorespiratory fitness, muscular endurance and strength as measured by increased METs and functional capacity ( ) Short Term: Increase workloads from initial exercise prescription for resistance, speed, and METs.;Short Term: Perform resistance training exercises routinely during rehab and add in resistance training at home;Long Term: Improve cardiorespiratory fitness, muscular endurance and strength as measured by increased METs and functional capacity ( )     Able to understand and use rate of perceived exertion (RPE) scale Yes Yes Yes     Intervention Provide education and explanation on how to use RPE scale Provide education and explanation on how to use RPE scale Provide education and explanation on how to use RPE scale     Expected Outcomes Short Term: Able to use RPE daily in rehab to express subjective intensity level;Long Term:  Able to use RPE to guide intensity level  when exercising independently Short Term: Able to use RPE daily in rehab to express subjective intensity level;Long Term:  Able to use RPE to guide intensity level when exercising independently Short Term: Able to use RPE daily in rehab to express subjective intensity level;Long Term:  Able to use RPE to guide intensity level when exercising independently     Able to understand and use Dyspnea scale Yes Yes Yes     Intervention Provide education and explanation on how to use Dyspnea scale Provide education and explanation on how to use Dyspnea scale Provide education and explanation on how to use Dyspnea scale     Expected Outcomes Short Term: Able to use Dyspnea scale daily in rehab to express subjective sense of shortness of breath during exertion;Long Term: Able to use Dyspnea scale to guide intensity level when exercising independently Short Term: Able to use Dyspnea scale daily in rehab to express subjective sense of shortness of breath during exertion;Long  Term: Able to use Dyspnea scale to guide intensity level when exercising independently Short Term: Able to use Dyspnea scale daily in rehab to express subjective sense of shortness of breath during exertion;Long Term: Able to use Dyspnea scale to guide intensity level when exercising independently     Knowledge and understanding of Target Heart Rate Range (THRR) Yes Yes Yes     Intervention Provide education and explanation of THRR including how the numbers were predicted and where they are located for reference Provide education and explanation of THRR including how the numbers were predicted and where they are located for reference Provide education and explanation of THRR including how the numbers were predicted and where they are located for reference     Expected Outcomes Short Term: Able to state/look up THRR;Short Term: Able to use daily as guideline for intensity in rehab;Long Term: Able to use THRR to govern intensity when exercising  independently Short Term: Able to state/look up THRR;Short Term: Able to use daily as guideline for intensity in rehab;Long Term: Able to use THRR to govern intensity when exercising independently Short Term: Able to state/look up THRR;Short Term: Able to use daily as guideline for intensity in rehab;Long Term: Able to use THRR to govern intensity when exercising independently     Understanding of Exercise Prescription Yes Yes Yes     Intervention Provide education, explanation, and written materials on patient's individual exercise prescription Provide education, explanation, and written materials on patient's individual exercise prescription Provide education, explanation, and written materials on patient's individual exercise prescription     Expected Outcomes Short Term: Able to explain program exercise prescription;Long Term: Able to explain home exercise prescription to exercise independently Short Term: Able to explain program exercise prescription;Long Term: Able to explain home exercise prescription to exercise independently Short Term: Able to explain program exercise prescription;Long Term: Able to explain home exercise prescription to exercise independently              Exercise Goals Re-Evaluation :  Exercise Goals Re-Evaluation     Row Name 02/16/23 1533 03/13/23 0853           Exercise Goal Re-Evaluation   Exercise Goals Review Increase Physical Activity;Able to understand and use Dyspnea scale;Understanding of Exercise Prescription;Increase Strength and Stamina;Knowledge and understanding of Target Heart Rate Range (THRR);Able to understand and use rate of perceived exertion (RPE) scale Increase Physical Activity;Able to understand and use Dyspnea scale;Understanding of Exercise Prescription;Increase Strength and Stamina;Knowledge and understanding of Target Heart Rate Range (THRR);Able to understand and use rate of perceived exertion (RPE) scale      Comments Pt to begin  exercise 8/6. Will monitor for progression. Pt has completed 5 group exercise sessions, missing 1 session. She started on the recumbent stepper for 30 min however has now progressed to walking the track for 10 min and the stepper for 20 min. She is severely exerted with high HR on the track and needs long rest breaks, current METs 1.35. Will continue to work with track walking as pt tolerates. She tolerated the recumbent stepper well, level 2, METs 2.2. She requires sitting rests during warm up and cool down.      Expected Outcomes Through exercise at rehab and home, the patient will decrease shortness of breath with daily activities and feel confident in carrying out an exercise regimen at home Through exercise at rehab and home, the patient will decrease shortness of breath with daily activities and feel confident in carrying out an exercise  regimen at home               Discharge Exercise Prescription (Final Exercise Prescription Changes):  Exercise Prescription Changes - 03/20/23 1500       Response to Exercise   Blood Pressure (Admit) 110/60    Blood Pressure (Exercise) 120/60    Blood Pressure (Exit) 91/62    Heart Rate (Admit) 108 bpm    Heart Rate (Exercise) 155 bpm    Heart Rate (Exit) 122 bpm    Oxygen Saturation (Admit) 98 %    Oxygen Saturation (Exercise) 91 %    Oxygen Saturation (Exit) 97 %    Rating of Perceived Exertion (Exercise) 19    Perceived Dyspnea (Exercise) 3    Duration Progress to 30 minutes of  aerobic without signs/symptoms of physical distress    Intensity THRR unchanged      Progression   Progression Continue to progress workloads to maintain intensity without signs/symptoms of physical distress.      Resistance Training   Training Prescription Yes    Weight red bands    Reps 10-15    Time 10 Minutes      Oxygen   Oxygen Continuous    Liters 2      NuStep   Level 3    Minutes 15    METs 2.6      Track   Laps 2.25    Minutes 15    METs  1.35      Oxygen   Maintain Oxygen Saturation 88% or higher             Nutrition:  Target Goals: Understanding of nutrition guidelines, daily intake of sodium 1500mg , cholesterol 200mg , calories 30% from fat and 7% or less from saturated fats, daily to have 5 or more servings of fruits and vegetables.  Biometrics:  Pre Biometrics - 02/12/23 1036       Pre Biometrics   Grip Strength 21 kg              Nutrition Therapy Plan and Nutrition Goals:  Nutrition Therapy & Goals - 02/20/23 1512       Nutrition Therapy   Diet Heart Healthy Diet    Drug/Food Interactions Statins/Certain Fruits      Personal Nutrition Goals   Nutrition Goal Patient to improve diet quality by using the plate method as a guide for meal planning to include lean protein/plant protein, fruits, vegetables, whole grains, nonfat dairy as part of a well-balanced diet.    Personal Goal #2 Patient to identify strategies for weight loss of 0.5-2.0# per week.    Comments Deborah [name] started ozempic in May 2024 at 181#; she is down ~14#. She has made many dietary changes including reduced carbohydrate intake and decreased deep frying foods. She remains motivated to improve nutrition and lose weight. She eating 2 meals daily and her husband is doing the majority of the cooking and she does most of the meal planning. Patient will benefit from participation in pulmonary rehab for nutrition, exercise, and lifestyle modification.      Intervention Plan   Intervention Prescribe, educate and counsel regarding individualized specific dietary modifications aiming towards targeted core components such as weight, hypertension, lipid management, diabetes, heart failure and other comorbidities.;Nutrition handout(s) given to patient.    Expected Outcomes Short Term Goal: Understand basic principles of dietary content, such as calories, fat, sodium, cholesterol and nutrients.;Long Term Goal: Adherence to prescribed nutrition plan.  Nutrition Assessments:  MEDIFICTS Score Key: ?70 Need to make dietary changes  40-70 Heart Healthy Diet ? 40 Therapeutic Level Cholesterol Diet   Picture Your Plate Scores: <16 Unhealthy dietary pattern with much room for improvement. 41-50 Dietary pattern unlikely to meet recommendations for good health and room for improvement. 51-60 More healthful dietary pattern, with some room for improvement.  >60 Healthy dietary pattern, although there may be some specific behaviors that could be improved.    Nutrition Goals Re-Evaluation:  Nutrition Goals Re-Evaluation     Row Name 02/20/23 1512             Goals   Current Weight 167 lb 1.7 oz (75.8 kg)       Comment A1c 5.8, most recent lipid panel from 01/2022 triglycerides 187       Expected Outcome Deborah [name] started ozempic in May 2024 at 181#; she is down ~14#. She has made many dietary changes including reduced carbohydrate intake and decreased deep frying foods. She remains motivated to improve nutrition and lose weight. She eating 2 meals daily and her husband is doing the majority of the cooking and she does most of the meal planning. Patient will benefit from participation in pulmonary rehab for nutrition, exercise, and lifestyle modification.                Nutrition Goals Discharge (Final Nutrition Goals Re-Evaluation):  Nutrition Goals Re-Evaluation - 02/20/23 1512       Goals   Current Weight 167 lb 1.7 oz (75.8 kg)    Comment A1c 5.8, most recent lipid panel from 01/2022 triglycerides 187    Expected Outcome Deborah [name] started ozempic in May 2024 at 181#; she is down ~14#. She has made many dietary changes including reduced carbohydrate intake and decreased deep frying foods. She remains motivated to improve nutrition and lose weight. She eating 2 meals daily and her husband is doing the majority of the cooking and she does most of the meal planning. Patient will benefit from participation in pulmonary rehab  for nutrition, exercise, and lifestyle modification.             Psychosocial: Target Goals: Acknowledge presence or absence of significant depression and/or stress, maximize coping skills, provide positive support system. Participant is able to verbalize types and ability to use techniques and skills needed for reducing stress and depression.  Initial Review & Psychosocial Screening:  Initial Psych Review & Screening - 02/12/23 0919       Initial Review   Current issues with Current Stress Concerns    Source of Stress Concerns Chronic Illness    Comments Pt states she worriers about her health      Family Dynamics   Good Support System? Yes    Comments Husband Deborah [name] is biggest source of support      Barriers   Psychosocial barriers to participate in program The patient should benefit from training in stress management and relaxation.      Screening Interventions   Interventions Encouraged to exercise    Expected Outcomes Short Term goal: Utilizing psychosocial counselor, staff and physician to assist with identification of specific Stressors or current issues interfering with healing process. Setting desired goal for each stressor or current issue identified.;Long Term Goal: Stressors or current issues are controlled or eliminated.             Quality of Life Scores:  Scores of 19 and below usually indicate a poorer quality of life in these areas.  A difference of  2-3 points is a clinically meaningful difference.  A difference of 2-3 points in the total score of the Quality of Life Index has been associated with significant improvement in overall quality of life, self-image, physical symptoms, and general health in studies assessing change in quality of life.  PHQ-9: Review Flowsheet  More data may exist      02/28/2023 02/12/2023 11/28/2022 02/02/2022 04/17/2016  Depression screen PHQ 2/9  Decreased Interest 0 0 0 0 3  Down, Depressed, Hopeless 0 0 0 0 3  PHQ - 2  Score 0 0 0 0 6  Altered sleeping 0 0 - 0 3  Tired, decreased energy 2 0 - 0 3  Change in appetite 0 0 - 0 2  Feeling bad or failure about yourself  0 0 - 0 3  Trouble concentrating 0 0 - 0 0  Moving slowly or fidgety/restless 0 0 - 0 0  Suicidal thoughts 0 0 - 0 1  PHQ-9 Score 2 0 - 0 18  Difficult doing work/chores Not difficult at all - - Not difficult at all Very difficult    Details           Interpretation of Total Score  Total Score Depression Severity:  1-4 = Minimal depression, 5-9 = Mild depression, 10-14 = Moderate depression, 15-19 = Moderately severe depression, 20-27 = Severe depression   Psychosocial Evaluation and Intervention:  Psychosocial Evaluation - 02/12/23 0919       Psychosocial Evaluation & Interventions   Interventions Relaxation education    Comments Deborah [name] discloses her stress comes from her health issues, more recently fighting breast and past lung cancers. She is worried about her declince in health.    Expected Outcomes For Deborah [name] to participate in PR free of any stressors or other psychosocial barriers    Continue Psychosocial Services  No Follow up required             Psychosocial Re-Evaluation:  Psychosocial Re-Evaluation     Row Name 02/16/23 1453 03/14/23 1424           Psychosocial Re-Evaluation   Current issues with Current Stress Concerns Current Stress Concerns      Comments No changes since orientation on 7/29. Deborah [name] is scheduled to start PR class on 8/6 Deborah [name] continues to worry about her health. She states she feels guilty about her husband having to take care of her. She feels like she should be able to take care of herself, but unfortunately due to her health she is unable to at this time. She denies a referral to a therapist at this time. Staff will educate Deborah [name] on healthy ways to manage her stress.      Expected Outcomes For Deborah [name] to attend PR without any psychosocial barriers or concerns For Deborah [name] to attend PR  without any psychosocial barriers or concerns      Interventions Encouraged to attend Pulmonary Rehabilitation for the exercise Stress management education;Encouraged to attend Pulmonary Rehabilitation for the exercise      Continue Psychosocial Services  No Follow up required No Follow up required               Psychosocial Discharge (Final Psychosocial Re-Evaluation):  Psychosocial Re-Evaluation - 03/14/23 1424       Psychosocial Re-Evaluation   Current issues with Current Stress Concerns    Comments Deborah [name] continues to worry about her health. She states she feels guilty about her husband having to take care of her. She  feels like she should be able to take care of herself, but unfortunately due to her health she is unable to at this time. She denies a referral to a therapist at this time. Staff will educate Deborah [name] on healthy ways to manage her stress.    Expected Outcomes For Deborah [name] to attend PR without any psychosocial barriers or concerns    Interventions Stress management education;Encouraged to attend Pulmonary Rehabilitation for the exercise    Continue Psychosocial Services  No Follow up required             Education: Education Goals: Education classes will be provided on a weekly basis, covering required topics. Participant will state understanding/return demonstration of topics presented.  Learning Barriers/Preferences:  Learning Barriers/Preferences - 02/12/23 1041       Learning Barriers/Preferences   Learning Barriers None    Learning Preferences Group Instruction;Written Material             Education Topics: Introduction to Pulmonary Rehab Group instruction provided by PowerPoint, verbal discussion, and written material to support subject matter. Instructor reviews what Pulmonary Rehab is, the purpose of the program, and how patients are referred.     Know Your Numbers Group instruction that is supported by a PowerPoint presentation. Instructor  discusses importance of knowing and understanding resting, exercise, and post-exercise oxygen saturation, heart rate, and blood pressure. Oxygen saturation, heart rate, blood pressure, rating of perceived exertion, and dyspnea are reviewed along with a normal range for these values.    Exercise for the Pulmonary Patient Group instruction that is supported by a PowerPoint presentation. Instructor discusses benefits of exercise, core components of exercise, frequency, duration, and intensity of an exercise routine, importance of utilizing pulse oximetry during exercise, safety while exercising, and options of places to exercise outside of rehab.  Flowsheet Row PULMONARY REHAB OTHER RESPIRATORY from 07/27/2016 in [facility] for Heart, Vascular, & Lung Health  Date 06/15/16  Educator EP  Instruction Review Code (Retired) 2- meets goals/outcomes          MET Level  Group instruction provided by PowerPoint, verbal discussion, and written material to support subject matter. Instructor reviews what METs are and how to increase METs.  Flowsheet Row PULMONARY REHAB OTHER RESPIRATORY from 03/15/2023 in [facility] for Heart, Vascular, & Lung Health  Date 03/15/23  Educator EP  Instruction Review Code 1- Verbalizes Understanding       Pulmonary Medications Verbally interactive group education provided by instructor with focus on inhaled medications and proper administration.   Anatomy and Physiology of the Respiratory System Group instruction provided by PowerPoint, verbal discussion, and written material to support subject matter. Instructor reviews respiratory cycle and anatomical components of the respiratory system and their functions. Instructor also reviews differences in obstructive and restrictive respiratory diseases with examples of each.  Flowsheet Row PULMONARY REHAB OTHER RESPIRATORY from 07/27/2016 in [facility]  for Heart, Vascular, & Lung Health  Date 06/22/16  Educator rn  Instruction Review Code (Retired) 2- meets goals/outcomes       Oxygen Safety Group instruction provided by PowerPoint, verbal discussion, and written material to support subject matter. There is an overview of "What is Oxygen" and "Why do we need it".  Instructor also reviews how to create a safe environment for oxygen use, the importance of using oxygen as prescribed, and the risks of noncompliance. There is a brief discussion on traveling with oxygen and resources the patient may utilize.  Flowsheet Row PULMONARY REHAB OTHER RESPIRATORY from 07/27/2016 in [facility] for Heart, Vascular, & Lung Health  Date 05/18/16  Educator rn  Instruction Review Code (Retired) 2- meets goals/outcomes       Oxygen Use Group instruction provided by PowerPoint, verbal discussion, and written material to discuss how supplemental oxygen is prescribed and different types of oxygen supply systems. Resources for more information are provided.    Breathing Techniques Group instruction that is supported by demonstration and informational handouts. Instructor discusses the benefits of pursed lip and diaphragmatic breathing and detailed demonstration on how to perform both.     Risk Factor Reduction Group instruction that is supported by a PowerPoint presentation. Instructor discusses the definition of a risk factor, different risk factors for pulmonary disease, and how the heart and lungs work together. Flowsheet Row PULMONARY REHAB OTHER RESPIRATORY from 03/08/2023 in [facility] for Heart, Vascular, & Lung Health  Date 03/08/23  Educator EP  Instruction Review Code 1- Verbalizes Understanding       MD Day A group question and answer session with a medical doctor that allows participants to ask questions that relate to their pulmonary disease state.   Nutrition for the Pulmonary  Patient Group instruction provided by PowerPoint slides, verbal discussion, and written materials to support subject matter. The instructor gives an explanation and review of healthy diet recommendations, which includes a discussion on weight management, recommendations for fruit and vegetable consumption, as well as protein, fluid, caffeine, fiber, sodium, sugar, and alcohol. Tips for eating when patients are short of breath are discussed. Flowsheet Row PULMONARY REHAB OTHER RESPIRATORY from 07/27/2016 in [facility] for Heart, Vascular, & Lung Health  Date 07/27/16  Educator edna  Instruction Review Code (Retired) 2- meets goals/outcomes        Other Education Group or individual verbal, written, or video instructions that support the educational goals of the pulmonary rehab program. Flowsheet Row PULMONARY REHAB OTHER RESPIRATORY from 03/01/2023 in [facility] for Heart, Vascular, & Lung Health  Date 03/01/23  Educator RN  Instruction Review Code 1- Verbalizes Understanding        Knowledge Questionnaire Score:  Knowledge Questionnaire Score - 02/12/23 1042       Knowledge Questionnaire Score   Pre Score 17/18             Core Components/Risk Factors/Patient Goals at Admission:  Personal Goals and Risk Factors at Admission - 02/12/23 0920       Core Components/Risk Factors/Patient Goals on Admission    Weight Management Weight Loss    Improve shortness of breath with ADL's Yes    Intervention Provide education, individualized exercise plan and daily activity instruction to help decrease symptoms of SOB with activities of daily living.    Expected Outcomes Short Term: Improve cardiorespiratory fitness to achieve a reduction of symptoms when performing ADLs;Long Term: Be able to perform more ADLs without symptoms or delay the onset of symptoms             Core Components/Risk Factors/Patient Goals Review:   Goals and  Risk Factor Review     Row Name 02/16/23 1455 03/14/23 1429           Core Components/Risk Factors/Patient Goals Review   Personal Goals Review Weight Management/Obesity;Improve shortness of breath with ADL's;Develop more efficient breathing techniques such as purse lipped breathing and diaphragmatic breathing and practicing self-pacing with activity. Weight Management/Obesity;Improve shortness of  breath with ADL's;Develop more efficient breathing techniques such as purse lipped breathing and diaphragmatic breathing and practicing self-pacing with activity.      Review Unable to evaluate progress yet. Pt will start her first class on 8/6/ Goal progressing for weight loss. Deborah [name] has been working with our dietician for weight loss goals. Goal progressing on improving her shortness of breath with ADLs. Goal progressing on developing more efficient breathing techniques such as purse lipped breathing and diaphragmatic breathing; and practicing self-pacing with activity. Deborah [name] has to be reminded to use purse lip breathing when she gets short of breath. Deborah [name] has only attended 6 sessions so far. We will continue to monitor her progress throughout the program.      Expected Outcomes see admission goals see admission goals               Core Components/Risk Factors/Patient Goals at Discharge (Final Review):   Goals and Risk Factor Review - 03/14/23 1429       Core Components/Risk Factors/Patient Goals Review   Personal Goals Review Weight Management/Obesity;Improve shortness of breath with ADL's;Develop more efficient breathing techniques such as purse lipped breathing and diaphragmatic breathing and practicing self-pacing with activity.    Review Goal progressing for weight loss. Deborah [name] has been working with our dietician for weight loss goals. Goal progressing on improving her shortness of breath with ADLs. Goal progressing on developing more efficient breathing techniques such as purse lipped  breathing and diaphragmatic breathing; and practicing self-pacing with activity. Deborah [name] has to be reminded to use purse lip breathing when she gets short of breath. Deborah [name] has only attended 6 sessions so far. We will continue to monitor her progress throughout the program.    Expected Outcomes see admission goals             ITP Comments:Pt is making expected progress toward Pulmonary Rehab goals after completing 8 sessions. Recommend continued exercise, life style modification, education, and utilization of breathing techniques to increase stamina and strength, while also decreasing shortness of breath with exertion.  Dr. Mechele Collin is Medical Director for Pulmonary Rehab at [facility].     Comments: Dr. Mechele Collin is Medical Director for Pulmonary Rehab at [facility].

## 2023-03-22 ENCOUNTER — Ambulatory Visit (HOSPITAL_COMMUNITY): Payer: BC Managed Care – PPO

## 2023-03-22 ENCOUNTER — Encounter (HOSPITAL_COMMUNITY)
Admission: RE | Admit: 2023-03-22 | Discharge: 2023-03-22 | Disposition: A | Discharge status: Home / Self Care | Payer: BC Managed Care – PPO | Source: Ambulatory Visit | Attending: Pulmonary Disease | Admitting: Pulmonary Disease

## 2023-03-22 VITALS — Wt 163.1 lb

## 2023-03-22 DIAGNOSIS — J984 Other disorders of lung: Secondary | ICD-10-CM | POA: Diagnosis not present

## 2023-03-22 DIAGNOSIS — R9431 Abnormal electrocardiogram [ECG] [EKG]: Secondary | ICD-10-CM | POA: Diagnosis not present

## 2023-03-22 DIAGNOSIS — Z87891 Personal history of nicotine dependence: Secondary | ICD-10-CM | POA: Diagnosis not present

## 2023-03-22 DIAGNOSIS — I499 Cardiac arrhythmia, unspecified: Secondary | ICD-10-CM | POA: Diagnosis not present

## 2023-03-22 NOTE — Progress Notes (Signed)
Daily Session Note  Patient Details  Name: Deborah [name] MRN: [id number] Date of Birth: 08/14/1955 Referring Provider:   Doristine Devoid Pulmonary Rehab Walk Test from 02/12/2023 in [facility] for Heart, Vascular, & Lung Health  Referring Provider Dr. Judeth Horn       Encounter Date: 03/22/2023  Check In:  Session Check In - 03/22/23 1336       Check-In   Supervising physician immediately available to respond to emergencies [facility] MD immediately available    Physician(s) Alveria Apley, NP    Location [facility]    Staff Present Elissa Lovett BS, ACSM-CEP, Exercise Physiologist;Maanya Hippert Earlene Plater, MS, ACSM-CEP, Exercise Physiologist;Casey Synthia Innocent, RN, BSN    Virtual Visit No    Medication changes reported     No    Fall or balance concerns reported    No    Comments no cane or walker use at home, states she passes out at times    Tobacco Cessation No Change    Warm-up and Cool-down Performed as group-led instruction    Resistance Training Performed Yes    VAD Patient? No    PAD/SET Patient? No      Pain Assessment   Currently in Pain? No/denies    Multiple Pain Sites No             Capillary Blood Glucose: No results found for this or any previous visit (from the past 24 hour(s)).    Social History   Tobacco Use  Smoking Status Former   Current packs/day: 0.00   Average packs/day: 3.0 packs/day for 43.0 years (129.0 ttl pk-yrs)   Types: Cigarettes   Start date: 12/17/1969   Quit date: 12/17/2012   Years since quitting: 10.2  Smokeless Tobacco Never    Goals Met:  Proper associated with RPD/PD & O2 Sat Exercise tolerated well No report of concerns or symptoms today Strength training completed today  Goals Unmet:  Not Applicable  Comments: Service time is from 1316 to 1502.    Dr. Mechele Collin is Medical Director for Pulmonary Rehab at [facility].

## 2023-03-27 ENCOUNTER — Ambulatory Visit (HOSPITAL_COMMUNITY): Payer: BC Managed Care – PPO

## 2023-03-27 ENCOUNTER — Encounter (HOSPITAL_COMMUNITY)
Admission: RE | Admit: 2023-03-27 | Discharge: 2023-03-27 | Disposition: A | Discharge status: Home / Self Care | Payer: BC Managed Care – PPO | Source: Ambulatory Visit | Attending: Pulmonary Disease | Admitting: Pulmonary Disease

## 2023-03-27 VITALS — Wt 163.1 lb

## 2023-03-27 DIAGNOSIS — I499 Cardiac arrhythmia, unspecified: Secondary | ICD-10-CM | POA: Diagnosis not present

## 2023-03-27 DIAGNOSIS — J984 Other disorders of lung: Secondary | ICD-10-CM

## 2023-03-27 DIAGNOSIS — R9431 Abnormal electrocardiogram [ECG] [EKG]: Secondary | ICD-10-CM | POA: Diagnosis not present

## 2023-03-27 DIAGNOSIS — Z87891 Personal history of nicotine dependence: Secondary | ICD-10-CM | POA: Diagnosis not present

## 2023-03-27 NOTE — Progress Notes (Signed)
Incomplete Session Note  Patient Details  Name: Deborah [name] MRN: [id number] Date of Birth: January 29, 1956 Referring Provider:   Doristine Devoid Pulmonary Rehab Walk Test from 02/12/2023 in [facility]  Referring Provider Dr. Milly Jakob did not complete her rehab session.  During pre exercise vital signs assessment, it was noted that the HR would fluctuate between 70-110s asked to assess. BP 110/78 palpated left wrist and heart rate felt tachy but also irregular.  Pt felt asymptomatic with no complaints.  Ambulated to the treatment room and the  Zoll monitor was placed.  Audible irregular heart beats heard.  Strips shows Sinus Tach with ?PACs verses afib however at times ?flutter waves appear. 12 lead EKG completed to confirm rhythm.  12 lead EKG showed ST with bigeminal PAC.  Reviewed by supervising provider for today -Joni Reining NP - pt sees Arrida for heart failure and was last seen in the office in January.  No further intervention warranted.  Will route this note to Dr Alex Gardener - referring provider as Lorain Childes and to sign the 12 lead EKG order that was placed.  Husband in room and aware.  Plan to return on Thursday for pulmonary rehab.  Pt discharged from pulmonary rehab with no further complaints or concerns. Alanson Aly, BSN Cardiac and Emergency planning/management officer

## 2023-03-29 ENCOUNTER — Encounter (HOSPITAL_COMMUNITY): Payer: BC Managed Care – PPO

## 2023-03-29 ENCOUNTER — Ambulatory Visit (HOSPITAL_COMMUNITY): Payer: BC Managed Care – PPO

## 2023-04-03 ENCOUNTER — Encounter (HOSPITAL_COMMUNITY): Payer: BC Managed Care – PPO

## 2023-04-03 ENCOUNTER — Ambulatory Visit (HOSPITAL_COMMUNITY): Payer: BC Managed Care – PPO

## 2023-04-05 ENCOUNTER — Ambulatory Visit (HOSPITAL_COMMUNITY): Payer: BC Managed Care – PPO

## 2023-04-05 ENCOUNTER — Encounter (HOSPITAL_COMMUNITY): Payer: BC Managed Care – PPO

## 2023-04-10 ENCOUNTER — Encounter (HOSPITAL_COMMUNITY): Payer: BC Managed Care – PPO

## 2023-04-10 ENCOUNTER — Ambulatory Visit (HOSPITAL_COMMUNITY): Payer: BC Managed Care – PPO

## 2023-04-11 ENCOUNTER — Telehealth (HOSPITAL_COMMUNITY): Payer: Self-pay

## 2023-04-11 NOTE — Telephone Encounter (Signed)
Pt returned voicemail and stated she would not be returning back to pulmonary rehab until she met with her cardiologist on 9/30.

## 2023-04-11 NOTE — Telephone Encounter (Signed)
Called pt to check on her since she hasn't been to pulmonary rehab since 9/10. No answer. Voicemail was left.

## 2023-04-12 ENCOUNTER — Ambulatory Visit (HOSPITAL_COMMUNITY): Payer: BC Managed Care – PPO

## 2023-04-12 ENCOUNTER — Encounter (HOSPITAL_COMMUNITY): Payer: BC Managed Care – PPO

## 2023-04-16 ENCOUNTER — Encounter: Payer: Self-pay | Admitting: Cardiology

## 2023-04-16 ENCOUNTER — Ambulatory Visit: Payer: BC Managed Care – PPO | Attending: Cardiology | Admitting: Cardiology

## 2023-04-16 ENCOUNTER — Ambulatory Visit: Payer: BC Managed Care – PPO

## 2023-04-16 VITALS — BP 95/68 | HR 108 | Ht 63.0 in | Wt 153.6 lb

## 2023-04-16 DIAGNOSIS — R002 Palpitations: Secondary | ICD-10-CM

## 2023-04-16 DIAGNOSIS — C50412 Malignant neoplasm of upper-outer quadrant of left female breast: Secondary | ICD-10-CM

## 2023-04-16 DIAGNOSIS — J9611 Chronic respiratory failure with hypoxia: Secondary | ICD-10-CM | POA: Diagnosis not present

## 2023-04-16 DIAGNOSIS — R000 Tachycardia, unspecified: Secondary | ICD-10-CM

## 2023-04-16 DIAGNOSIS — E782 Mixed hyperlipidemia: Secondary | ICD-10-CM

## 2023-04-16 DIAGNOSIS — J449 Chronic obstructive pulmonary disease, unspecified: Secondary | ICD-10-CM

## 2023-04-16 DIAGNOSIS — I5032 Chronic diastolic (congestive) heart failure: Secondary | ICD-10-CM

## 2023-04-16 DIAGNOSIS — I10 Essential (primary) hypertension: Secondary | ICD-10-CM

## 2023-04-16 DIAGNOSIS — Z171 Estrogen receptor negative status [ER-]: Secondary | ICD-10-CM

## 2023-04-16 NOTE — Patient Instructions (Addendum)
Medication Instructions:  Your physician recommends that you continue on your current medications as directed. Please refer to the Current Medication list given to you today.  *If you need a refill on your cardiac medications before your next appointment, please call your pharmacy*  Lab Work: -None ordered  Testing/Procedures: Your physician has requested that you have an echocardiogram. Echocardiography is a painless test that uses sound waves to create images of your heart. It provides your doctor with information about the size and shape of your heart and how well your heart's chambers and valves are working. This procedure takes approximately one hour. There are no restrictions for this procedure. Please do NOT wear cologne, perfume, aftershave, or lotions (deodorant is allowed). Please arrive 15 minutes prior to your appointment time.   Heart Monitor:  Your physician has requested you wear a ZIO heart patch monitor for 14  days.  Your monitor will be mailed to your home address within 3-5 business days. This is sent via [employer] from [employer]. However, if you have not received your monitor after 5 business days please send [place] a MyChart message or call the office at [phone], so we may follow up on this for you.   This monitor is a medical device (single patch monitor) that records the heart's electrical activity. Doctors most often use these monitors to diagnose arrhythmias. Arrhythmias are problems with the speed or rhythm of the heartbeat.   [employer] supplies 1 patch per enrollment. Additional stickers are not available.  Please DO NOT apply the patch if you will be having a Nuclear Stress Test, Echocardiogram, Cardiac CT, Cardiac MRI, Chest X-ray during the period you would be wearing the monitor. The patch cannot be worn during these tests.  You cannot remove and re-apply the ZIO patch monitor.   Applying the Monitor: Once you receive your monitor, this will include a  small razor, abrader, and 4 alcohol pads. Shave hair from upper left chest Rub abrader disc in 40 strokes over the left upper chest as indicated in your monitor instructions Clean area with 4 enclosed alcohol pads (there may be a mild & brief stinging sensation over the newly abraded area, but this is normal). Let dry Apply patch as indicated in monitor instructions. Patch will be placed under collarbone on the left side of the chest with arrow pointing upward. Rub adhesive wings for 2 minutes. Remove white label marked "1". Remove the white label marked "2". Rub patch adhesive wings for an 2 minutes.  While looking in a mirror, press and release button in the center of the patch. You may hear a "click". A small green light will flash 4-6 times and then stop. This will be your indicator that the monitor has been turned on.  Wearing the Monitor: Avoid showering during the first 24 hours of wearing the monitor.  After 24 hours you may shower with the patch on. Take brief showers with your back facing the shower head.  Avoid excessive sweating to help maximize wear time. Do not submerge the device, no hot tubs, and no swimming pools. Keep any lotions or oils away from the patch. Press the button if you feel a symptom. You will hear a small click. Record date, time, and symptoms in the Patient Logbook or App.  Monitor Issues: Call [employer] Customer Care at [phone] if you have questions regarding your Zio Patch Monitor. Call them immediately if you see an orange/ amber colored light blinking on your monitor.  If your monitor falls off and you cannot get this reapplied or if you need suggestions for securing your monitor call [employer] at [phone].   Returning the Monitor: Once you have completed wearing your monitor, follow instructions on the last 2 pages of the Patient Logbook. Stick monitor patch on to the last page of the Patient Logbook.  Place Patient Logbook with  monitor in the return box provided. Use locking tab on box and tape box closed securely. The return box has pre-paid postage on it.  Place the return box in the regular [place] Mail box as soon as possible It will take anywhere from 1-2 weeks for your provider to receive and review your results once you mail this back. If for some reason you have misplaced your return box then call our office and we can provide another box and/or mail it off for you.   Billing  and Patient Assistance Program Information: We have supplied [employer] with any of your insurance information on file for billing purposes. [employer] offers a sliding scale Patient Assistance Program for patients that do not have insurance, or whose insurance does not completely cover the cost of the ZIO monitor. You must apply for the Patient Assistance Program to qualify for this discounted rate. To apply, please call [employer] at [phone], select option 1, ask to apply for the Patient Assistance Program. [employer] will ask your household income, and how many people are in your household. They will quote your out-of-pocket cost based on that information. [employer] will also be able to set up for a 74-month, interest-free payment plan if needed.     Follow-Up: At [facility], you and your health needs are our priority.  As part of our continuing mission to provide you with exceptional heart care, we have created designated Provider Care Teams.  These Care Teams include your primary Cardiologist (physician) and Advanced Practice Providers (APPs -  Physician Assistants and Nurse Practitioners) who all work together to provide you with the care you need, when you need it.  Your next appointment:   6 week(s)  Provider:   You may see Lorine Bears, MD or one of the following Advanced Practice Providers on your designated Care Team:   Nicolasa Ducking, NP Eula Listen, PA-C Cadence Fransico Michael, PA-C Charlsie Quest, NP    Other  Instructions -YouTube search Zio Patch Heart Monitor 3:46 video for step-by-step instructions

## 2023-04-16 NOTE — Progress Notes (Signed)
Cardiology Office Note:  .   Date:  04/16/2023  ID:  Deborah [name], DOB 1955-10-27, MRN [id number] PCP: Doreene Nest, NP  [facility] Providers Cardiologist:  Lorine Bears, MD    History of Present Illness: Deborah [name]   Deborah [name] is a 67 y.o. female with history of chronic diastolic heart failure, chronic right heart failure due to COPD, oxygen dependence, previous tobacco use, COPD, hypertension, obesity, bipolar disorder, history of breast cancer, pulmonary hypertension, obstructive sleep apnea, type 2 diabetes, tobacco abuse disorder, morbid obesity, was here today for follow-up.  She was admitted in 07/2014 for CHF.  Moderate left pleural effusion.  Echocardiogram revealed EF 50-55%, G1 DD, dilated RV with hypokinesis and mild pulmonary hypertension.  VQ scan showed low probability for PE.  She subsequently quit smoking.  She was diagnosed with squamous cell carcinoma of the lung stage IIIb 07/2017.  She had a large left lower lobe mass in addition to mediastinal lymphadenopathy.  She was treated with radiation, chemotherapy, and immunotherapy.  Subsequent echocardiogram revealed EF 55 to 60%, no RWMA, indeterminate diastolic filling today effusion, RVSP 35 mmHg.  Heart monitor in 2022 showed predominantly normal sinus rhythm with average heart rates of 90s, first-degree AV block, and second-degree AV block with brief SVTs and rare PVCs.  In 10/2021 she was doing well from a cardiac perspective for reported recurrent breast cancer.  She was last seen in clinic 07/25/2022 and overall was doing well from a cardiac perspective.  She has started a new round of breast cancer treatment and they were planning surgery with resection of the cancer.  She had denied any angina or anginal equivalents.  She was continued on her baseline oxygen therapy.  There was no further medication changes that were made for testing that was needed at that time.  She returns clinic today accompanied by her  husband.  She denies any chest pain, chest pressure/tightness, or palpitations.  She continues to have chronic shortness of breath and is maintained on chronic oxygen therapy, peripheral edema which she wears compression stockings for, and fatigue.  She states that she was at pulmonary rehab and was up walking and her heart rate shot up to 160 and they completed an EKG and told her that she needed to follow-up with cardiology that she was likely in atrial fibrillation or atrial flutter.  She was concerned and she was advised that if she was in A-fib or a flutter that she would need to be on anticoagulant medication or if she would have a stroke.  She states that she has been compliant with her current medications but she has noted as of late to have a lower blood pressure which requires her to be in time out when she goes to rehab as her blood pressure is too low for her to exercise.  She denies any recent hospitalizations or visits to the emergency department.  ROS: 10 point review of systems is reviewed and considered negative with exception what is been listed in the HPI  Studies Reviewed: Deborah [name]   EKG Interpretation Date/Time:  Monday April 16 2023 10:47:15 EDT Ventricular Rate:  108 PR Interval:  176 QRS Duration:  78 QT Interval:  338 QTC Calculation: 452 R Axis:   67  Text Interpretation: Sinus tachycardia Low voltage QRS Nonspecific T wave abnormality When compared with ECG of 27-Mar-2023 14:01, Confirmed by Charlsie Quest ([id number]) on 04/16/2023 10:58:43 AM    Heart monitor 10/2021 Patient had a min HR  of 53 bpm, max HR of 197 bpm, and avg HR of 91 bpm.  Predominant underlying rhythm was Sinus Rhythm. First Degree AV Block was present.  5 Supraventricular Tachycardia runs occurred, the run with the fastest interval lasting 8 beats with a max rate of 197 bpm, the longest lasting 16 beats with an avg rate of 121 bpm. Second Degree AV Block-Mobitz I (Wenckebach) was present. Isolated Rare  PVCs.   Echo 05/25/20  1. Left ventricular ejection fraction, by estimation, is 55 to 60%. The  left ventricle has normal function. The left ventricle has no regional  wall motion abnormalities. Indeterminate diastolic filling due to E-A  fusion.   2. Right ventricular systolic function is normal. The right ventricular  size is normal. There is normal pulmonary artery systolic pressure. The  estimated right ventricular systolic pressure is 35.0 mmHg.   3. The mitral valve is normal in structure. No evidence of mitral valve  regurgitation. No evidence of mitral stenosis.   4. The aortic valve is normal in structure. Aortic valve regurgitation is  not visualized. No aortic stenosis is present.   5. The inferior vena cava is normal in size with greater than 50%  respiratory variability, suggesting right atrial pressure of 3 mmHg.  Comparison(s): No significant change from prior study. Prior images  reviewed side by side.  Risk Assessment/Calculations:             Physical Exam:   VS:  BP 95/68 (BP Location: Left Arm, Patient Position: Sitting, Cuff Size: Normal)   Pulse (!) 108   Ht 5\' 3"  (1.6 m)   Wt 153 lb 9.6 oz (69.7 kg)   SpO2 98%   BMI 27.21 kg/m    Wt Readings from Last 3 Encounters:  04/16/23 153 lb 9.6 oz (69.7 kg)  03/22/23 163 lb 2.3 oz (74 kg)  03/20/23 163 lb 2.3 oz (74 kg)    GEN: Well nourished, well developed in no acute distress NECK: No JVD; No carotid bruits CARDIAC: RRR, tachycardic, no murmurs, rubs, gallops RESPIRATORY:  Clear to auscultation without rales, wheezing or rhonchi  ABDOMEN: Soft, non-tender, non-distended EXTREMITIES:  No edema with compression stockings on bilateral only; No deformity   ASSESSMENT AND PLAN: .   Chronic HFpEF with last echocardiogram completed in 2021 with an LVEF of 55 to 60%, no RWMA, no valvular abnormalities.  She is euvolemic on exam today.  She is continued on furosemide 30 mg daily.  With recurrent breast cancer and  treatment with chemo and surgery she has been scheduled for repeat echocardiogram to reevaluate function.  Medications can be changed at that time.  She has been encouraged to weigh herself daily, decrease her sodium and fluid intake.  Recurrent left breast cancer where she had previously been treated with radiation and chemotherapy and then chemotherapy again.  She underwent an additional lumpectomy.  She is not considered a candidate for repeat chemotherapy nor any additional therapies due to her poor overall health.  Lymph node mapping and/or dissection was deferred by the patient's request due to her overall poor health.  Pros and cons were discussed.  She was contacted to continue on immunotherapy alone.  Sinus tachycardia noted on EKG today.  Sinus tachycardia is a common occurrence and she has been continued on Cardizem 240 mg daily.  Pulmonary rehab she had an EKG that was done that was reviewed which showed PACs without any atrial fibrillation or atrial flutter noted but an increased amount of artifact.  EKG today reveals only sinus tachycardia with a rate of 108.  With concerns for irregular rhythm she has been placed on a ZIO XT monitor for 2 weeks to rule out any atrial fibrillation or atrial flutter.  No medication changes were made at this time for concern of worsening sinus tachycardia with discontinuation of calcium channel blocker therapy.  Chronic respiratory failure and COPD on 2 to 4 L of O2.  She continues to be stable and managed by pulmonary.  We have also asked to the pulse ox on pulmonary rehab into after testing is completed.  Hyperlipidemia with last LDL of 45 often.  She been continued on rosuvastatin 5 mg daily.  Hypertension with a blood pressure today of 95/68.  With her weight loss she has not noted a drop in blood pressure.  She is encouraged to continue to monitor pressures 1 to 2 hours postmedication administration at home.  We also discussed changing of the medication  and have opted to leave her current medication as is at this time until after echo and heart monitor have resulted.       Dispo: The patient to return to clinic to see MD/APP in 6 weeks or sooner if needed for reevaluation of symptoms  Signed, Maxamus Colao, NP

## 2023-04-17 ENCOUNTER — Encounter (HOSPITAL_COMMUNITY): Payer: BC Managed Care – PPO

## 2023-04-17 ENCOUNTER — Ambulatory Visit (HOSPITAL_COMMUNITY): Payer: BC Managed Care – PPO

## 2023-04-17 NOTE — Addendum Note (Signed)
Encounter addended by: Guss Bunde, RRT on: 04/17/2023 3:26 PM  Actions taken: Vitals modified

## 2023-04-18 ENCOUNTER — Telehealth (HOSPITAL_COMMUNITY): Payer: Self-pay

## 2023-04-18 NOTE — Telephone Encounter (Signed)
Called pt to check on her post cardiology appointment. Pt stated she has an echo ordered for 10/21 and the cardiologist does not want her to return to PR class until after the echo.

## 2023-04-18 NOTE — Addendum Note (Signed)
Encounter addended by: Guss Bunde, RRT on: 04/18/2023 8:38 AM  Actions taken: Clinical Note Signed

## 2023-04-18 NOTE — Progress Notes (Signed)
Pulmonary Individual Treatment Plan  Patient Details  Name: DEBORHA SI MRN: [id number] Date of Birth: 1955/10/10 Referring Provider:   Doristine Devoid Pulmonary Rehab Walk Test from 02/12/2023 in [facility]  Referring Provider Dr. Judeth Horn       Initial Encounter Date:  Flowsheet Row Pulmonary Rehab Walk Test from 02/12/2023 in [facility]  Date 02/12/23       Visit Diagnosis: Irregular heart beat - Plan: EKG 12-Lead, EKG 12-Lead, CANCELED: EKG 12-Lead, CANCELED: EKG 12-Lead  Restrictive lung disease  Patient's Home Medications on Admission:   Current Outpatient Medications:    acetaminophen (TYLENOL) 325 MG tablet, Take 650 mg by mouth every 6 (six) hours as needed for headache (pain)., Disp: , Rfl:    albuterol (VENTOLIN HFA) 108 (90 Base) MCG/ACT inhaler, Inhale 2 puffs into the lungs every 6 (six) hours as needed for wheezing or shortness of breath., Disp: 8 g, Rfl: 3   anastrozole (ARIMIDEX) 1 MG tablet, Take 1 tablet (1 mg total) by mouth daily., Disp: 90 tablet, Rfl: 3   aspirin 81 MG chewable tablet, Chew 1 tablet (81 mg total) by mouth daily., Disp: 30 tablet, Rfl: 1   Blood Glucose Monitoring Suppl DEVI, 1 each by Does not apply route in the morning, at noon, and at bedtime. May substitute to any manufacturer covered by patient's insurance., Disp: 1 each, Rfl: 0   Budeson-Glycopyrrol-Formoterol (BREZTRI AEROSPHERE) 160-9-4.8 MCG/ACT AERO, Inhale 2 puffs into the lungs 2 (two) times daily., Disp: 1 each, Rfl: 11   Cholecalciferol (VITAMIN D3) 125 MCG (5000 UT) TABS, Take 5,000 Units by mouth daily., Disp: , Rfl:    diltiazem (CARDIZEM CD) 240 MG 24 hr capsule, TAKE 1 CAPSULE BY MOUTH EVERY DAY, Disp: 90 capsule, Rfl: 2   furosemide (LASIX) 20 MG tablet, TAKE 1 AND 1/2 TABLETS DAILY BY MOUTH, Disp: 135 tablet, Rfl: 3   glucose blood (CONTOUR NEXT TEST)  test strip, 1 EACH BY IN VITRO ROUTE IN THE MORNING, AT NOON, AND AT BEDTIME. MAY SUBSTITUTE TO ANY MANUFACTURER COVERED BY PATIENT'S INSURANCE., Disp: 300 strip, Rfl: 1   ipratropium-albuterol (DUONEB) 0.5-2.5 (3) MG/3ML SOLN, Take 3 mLs by nebulization every 4 (four) hours as needed., Disp: 360 mL, Rfl: 11   lidocaine-prilocaine (EMLA) cream, APPLY TO AFFECTED AREA ONCE AS DIRECTED, Disp: 30 g, Rfl: 3   montelukast (SINGULAIR) 10 MG tablet, TAKE 1 TABLET (10 MG TOTAL) BY MOUTH AT BEDTIME. FOR ALLERGIES, Disp: 90 tablet, Rfl: 0   OXYGEN, Inhale 4 L/min into the lungs continuous., Disp: , Rfl:    potassium chloride (KLOR-CON M10) 10 MEQ tablet, TAKE 1 TABLET BY MOUTH EVERY DAY, Disp: 90 tablet, Rfl: 0   rosuvastatin (CRESTOR) 5 MG tablet, TAKE 1 TABLET BY MOUTH EVERY DAY FOR CHOLESTEROL, Disp: 90 tablet, Rfl: 0   Semaglutide, 1 MG/DOSE, 4 MG/3ML SOPN, Inject 1 mg as directed once a week. for diabetes., Disp: 9 mL, Rfl: 0   Spacer/Aero-Holding Chambers (AEROCHAMBER MV) inhaler, Use as instructed, Disp: 1 each, Rfl: 0  Past Medical History: Past Medical History:  Diagnosis Date   Acute hypoxemic respiratory failure (HCC)    Acute on chronic respiratory failure with hypoxia (HCC) 05/24/2020   Anemia    as teen   Asthma    Breast cancer (HCC)    left breast cancer 02/2020   CHF (congestive heart failure) (HCC)  CHF, acute on chronic, unknown EF 07/25/2014   COPD exacerbation (HCC) 12/18/2015   COPD, mild (HCC)    Depression    Diabetes mellitus without complication (HCC)    Diverticulitis    Diverticulosis of colon with hemorrhage    Dyspnea 04/17/2013   Followed in Pulmonary clinic/ [facility]/ [facility]  - hfa 75% p coaching 04/17/2013   - PFT's 06/05/2013 no airflow obst, dlco 60 corrects to 96%     Family history of anesthesia complication    vomiting   GI bleeding    Heart failure (HCC)    New onset 07/25/14   Histoplasmosis    left eye   Hyperkalemia    Hypertension    Lung  cancer (HCC) 05/02/2018   s/p chemoradiation, immunotherapy   Obesity (BMI 30-39.9)    Pneumonia    dx wtih pneumonia on 05/27/16- seen by [facility]    PONV (postoperative nausea and vomiting)    Restrictive lung disease    Severe sepsis (HCC)    Shortness of breath    with exertion    Sleep apnea    mask and oxygen at nite for sleep at 2L    Tobacco abuse    Umbilical hernia     Tobacco Use: Social History   Tobacco Use  Smoking Status Former   Current packs/day: 0.00   Average packs/day: 3.0 packs/day for 43.0 years (129.0 ttl pk-yrs)   Types: Cigarettes   Start date: 12/17/1969   Quit date: 12/17/2012   Years since quitting: 10.3  Smokeless Tobacco Never    Labs: Review Flowsheet  More data exists      Latest Ref Rng & Units 02/02/2022 05/30/2022 08/08/2022 11/28/2022 02/28/2023  Labs for ITP Cardiac and Pulmonary Rehab  Cholestrol 0 - 200 mg/dL 161  - - - 096   LDL (calc) 0 - 99 mg/dL 59  - - - 45   HDL-C >04.54 mg/dL 09.81  - - - 19.14   Trlycerides 0.0 - 149.0 mg/dL 782.9  - - - 562.1   Hemoglobin A1c 4.6 - 6.5 % 6.5  5.5  5.3  5.8  5.4     Details            Capillary Blood Glucose: Lab Results  Component Value Date   GLUCAP 132 (H) 02/27/2023   GLUCAP 98 02/27/2023   GLUCAP 132 (H) 02/20/2023   GLUCAP 105 (H) 02/20/2023   GLUCAP 119 (H) 02/12/2023    POCT Glucose     Row Name 02/12/23 0930             POCT Blood Glucose   Pre-Exercise 119 mg/dL                Pulmonary Assessment Scores:  Pulmonary Assessment Scores     Row Name 02/12/23 0925         ADL UCSD   SOB Score total 81       CAT Score   CAT Score 23       mMRC Score   mMRC Score 4             UCSD: Self-administered rating of dyspnea associated with activities of daily living (ADLs) 6-point scale (0 = "not at all" to 5 = "maximal or unable to do because of breathlessness")  Scoring Scores range from 0 to 120.  Minimally important difference is 5  units  CAT: CAT can identify the health impairment of COPD patients and is better  correlated with disease progression.  CAT has a scoring range of zero to 40. The CAT score is classified into four groups of low (less than 10), medium (10 - 20), high (21-30) and very high (31-40) based on the impact level of disease on health status. A CAT score over 10 suggests significant symptoms.  A worsening CAT score could be explained by an exacerbation, poor medication adherence, poor inhaler technique, or progression of COPD or comorbid conditions.  CAT MCID is 2 points  mMRC: mMRC (Modified Medical Research Council) Dyspnea Scale is used to assess the degree of baseline functional disability in patients of respiratory disease due to dyspnea. No minimal important difference is established. A decrease in score of 1 point or greater is considered a positive change.   Pulmonary Function Assessment:  Pulmonary Function Assessment - 02/12/23 0924       Breath   Bilateral Breath Sounds Decreased;Rhonchi;Expiratory;Wheezes    Shortness of Breath Yes;Limiting activity             Exercise Target Goals: Exercise Program Goal: Individual exercise prescription set using results from initial 6 min walk test and THRR while considering  patient's activity barriers and safety.   Exercise Prescription Goal: Initial exercise prescription builds to 30-45 minutes a day of aerobic activity, 2-3 days per week.  Home exercise guidelines will be given to patient during program as part of exercise prescription that the participant will acknowledge.  Activity Barriers & Risk Stratification:  Activity Barriers & Cardiac Risk Stratification - 02/12/23 0925       Activity Barriers & Cardiac Risk Stratification   Activity Barriers Deconditioning;Muscular Weakness;Shortness of Breath;Back Problems             6 Minute Walk:  6 Minute Walk     Row Name 02/12/23 1021         6 Minute Walk   Phase Initial      Distance 285 feet     Walk Time 6 minutes     # of Rest Breaks 2  stopped at 1:35-2:14 and 3:38-5:17     MPH 0.54     METS 1.05     RPE 16     Perceived Dyspnea  3     Symptoms Yes (comment)     Comments SOB     Resting HR 104 bpm     Resting BP 110/62     Resting Oxygen Saturation  97 %     Exercise Oxygen Saturation  during 6 min walk 96 %     Max Ex. HR 153 bpm     Max Ex. BP 124/68     2 Minute Post BP 112/62       Interval HR   1 Minute HR 138     2 Minute HR 138     3 Minute HR 150     4 Minute HR 153     5 Minute HR 118     6 Minute HR 145     Interval Heart Rate? Yes       Interval Oxygen   Interval Oxygen? Yes     Baseline Oxygen Saturation % 97 %     1 Minute Oxygen Saturation % 96 %     1 Minute Liters of Oxygen 2 L     2 Minute Oxygen Saturation % 96 %     2 Minute Liters of Oxygen 2 L     3 Minute Oxygen Saturation % 96 %  3 Minute Liters of Oxygen 2 L     4 Minute Oxygen Saturation % 96 %     4 Minute Liters of Oxygen 2 L     5 Minute Oxygen Saturation % 96 %     5 Minute Liters of Oxygen 2 L     6 Minute Oxygen Saturation % 97 %     6 Minute Liters of Oxygen 2 L     2 Minute Post Oxygen Saturation % 98 %     2 Minute Post Liters of Oxygen 2 L              Oxygen Initial Assessment:  Oxygen Initial Assessment - 02/12/23 0924       Home Oxygen   Home Oxygen Device Home Concentrator;E-Tanks    Sleep Oxygen Prescription Continuous    Liters per minute 2    Home Exercise Oxygen Prescription Continuous    Liters per minute 5    Home Resting Oxygen Prescription Continuous    Liters per minute 2    Compliance with Home Oxygen Use Yes      Initial 6 min Walk   Oxygen Used Continuous;E-Tanks    Liters per minute 2      Program Oxygen Prescription   Program Oxygen Prescription Continuous;E-Tanks    Liters per minute 2      Intervention   Short Term Goals To learn and exhibit compliance with exercise, home and travel O2  prescription;To learn and understand importance of maintaining oxygen saturations>88%;To learn and demonstrate proper use of respiratory medications;To learn and understand importance of monitoring SPO2 with pulse oximeter and demonstrate accurate use of the pulse oximeter.;To learn and demonstrate proper pursed lip breathing techniques or other breathing techniques.     Long  Term Goals Exhibits compliance with exercise, home  and travel O2 prescription;Maintenance of O2 saturations>88%;Compliance with respiratory medication;Verbalizes importance of monitoring SPO2 with pulse oximeter and return demonstration;Exhibits proper breathing techniques, such as pursed lip breathing or other method taught during program session;Demonstrates proper use of MDI's             Oxygen Re-Evaluation:  Oxygen Re-Evaluation     Row Name 02/16/23 1534 03/13/23 0859 04/10/23 0947         Program Oxygen Prescription   Program Oxygen Prescription Continuous;E-Tanks Continuous;E-Tanks Continuous;E-Tanks     Liters per minute 2 2 3      Comments -- -- has been on 3L lately       Home Oxygen   Home Oxygen Device Home Concentrator;E-Tanks Home Concentrator;E-Tanks Home Concentrator;E-Tanks     Sleep Oxygen Prescription Continuous Continuous Continuous     Liters per minute 2 2 2      Home Exercise Oxygen Prescription Continuous Continuous Continuous     Liters per minute 5 5 5      Home Resting Oxygen Prescription Continuous Continuous Continuous     Liters per minute 2 2 2      Compliance with Home Oxygen Use Yes Yes Yes       Goals/Expected Outcomes   Short Term Goals To learn and exhibit compliance with exercise, home and travel O2 prescription;To learn and understand importance of maintaining oxygen saturations>88%;To learn and demonstrate proper use of respiratory medications;To learn and understand importance of monitoring SPO2 with pulse oximeter and demonstrate accurate use of the pulse oximeter.;To  learn and demonstrate proper pursed lip breathing techniques or other breathing techniques.  To learn and exhibit compliance with exercise, home and travel O2  prescription;To learn and understand importance of maintaining oxygen saturations>88%;To learn and demonstrate proper use of respiratory medications;To learn and understand importance of monitoring SPO2 with pulse oximeter and demonstrate accurate use of the pulse oximeter.;To learn and demonstrate proper pursed lip breathing techniques or other breathing techniques.  To learn and exhibit compliance with exercise, home and travel O2 prescription;To learn and understand importance of maintaining oxygen saturations>88%;To learn and demonstrate proper use of respiratory medications;To learn and understand importance of monitoring SPO2 with pulse oximeter and demonstrate accurate use of the pulse oximeter.;To learn and demonstrate proper pursed lip breathing techniques or other breathing techniques.      Long  Term Goals Exhibits compliance with exercise, home  and travel O2 prescription;Maintenance of O2 saturations>88%;Compliance with respiratory medication;Verbalizes importance of monitoring SPO2 with pulse oximeter and return demonstration;Exhibits proper breathing techniques, such as pursed lip breathing or other method taught during program session;Demonstrates proper use of MDI's Exhibits compliance with exercise, home  and travel O2 prescription;Maintenance of O2 saturations>88%;Compliance with respiratory medication;Verbalizes importance of monitoring SPO2 with pulse oximeter and return demonstration;Exhibits proper breathing techniques, such as pursed lip breathing or other method taught during program session;Demonstrates proper use of MDI's Exhibits compliance with exercise, home  and travel O2 prescription;Maintenance of O2 saturations>88%;Compliance with respiratory medication;Verbalizes importance of monitoring SPO2 with pulse oximeter and return  demonstration;Exhibits proper breathing techniques, such as pursed lip breathing or other method taught during program session;Demonstrates proper use of MDI's     Comments -- -- 3L     Goals/Expected Outcomes Compliance and understanding of oxygen saturation monitoring and breathing techniques to decrease shortness of breath Compliance and understanding of oxygen saturation monitoring and breathing techniques to decrease shortness of breath Compliance and understanding of oxygen saturation monitoring and breathing techniques to decrease shortness of breath              Oxygen Discharge (Final Oxygen Re-Evaluation):  Oxygen Re-Evaluation - 04/10/23 0947       Program Oxygen Prescription   Program Oxygen Prescription Continuous;E-Tanks    Liters per minute 3    Comments has been on 3L lately      Home Oxygen   Home Oxygen Device Home Concentrator;E-Tanks    Sleep Oxygen Prescription Continuous    Liters per minute 2    Home Exercise Oxygen Prescription Continuous    Liters per minute 5    Home Resting Oxygen Prescription Continuous    Liters per minute 2    Compliance with Home Oxygen Use Yes      Goals/Expected Outcomes   Short Term Goals To learn and exhibit compliance with exercise, home and travel O2 prescription;To learn and understand importance of maintaining oxygen saturations>88%;To learn and demonstrate proper use of respiratory medications;To learn and understand importance of monitoring SPO2 with pulse oximeter and demonstrate accurate use of the pulse oximeter.;To learn and demonstrate proper pursed lip breathing techniques or other breathing techniques.     Long  Term Goals Exhibits compliance with exercise, home  and travel O2 prescription;Maintenance of O2 saturations>88%;Compliance with respiratory medication;Verbalizes importance of monitoring SPO2 with pulse oximeter and return demonstration;Exhibits proper breathing techniques, such as pursed lip breathing or other  method taught during program session;Demonstrates proper use of MDI's    Comments 3L    Goals/Expected Outcomes Compliance and understanding of oxygen saturation monitoring and breathing techniques to decrease shortness of breath             Initial Exercise Prescription:  Initial Exercise Prescription -  02/12/23 1000       Date of Initial Exercise RX and Referring Provider   Date 02/12/23    Referring Provider Dr. Judeth Horn    Expected Discharge Date 05/10/23      Oxygen   Oxygen Continuous    Liters 2    Maintain Oxygen Saturation 88% or higher      NuStep   Level 1    SPM 40    Minutes 30    METs 1.2      Prescription Details   Frequency (times per week) 2    Duration Progress to 30 minutes of continuous aerobic without signs/symptoms of physical distress      Intensity   THRR 40-80% of Max Heartrate 62-125    Ratings of Perceived Exertion 11-13    Perceived Dyspnea 0-4      Progression   Progression Continue to progress workloads to maintain intensity without signs/symptoms of physical distress.      Resistance Training   Training Prescription Yes    Weight red bands    Reps 10-15             Perform Capillary Blood Glucose checks as needed.  Exercise Prescription Changes:   Exercise Prescription Changes     Row Name 02/20/23 1500 03/06/23 1500 03/20/23 1500 03/22/23 1420       Response to Exercise   Blood Pressure (Admit) 108/64 106/60 110/60 108/68    Blood Pressure (Exercise) 116/66 108/60 120/60 --    Blood Pressure (Exit) 90/50 98/62 91/62  102/60    Heart Rate (Admit) 110 bpm 112 bpm 108 bpm 99 bpm    Heart Rate (Exercise) 130 bpm 138 bpm 155 bpm 135 bpm    Heart Rate (Exit) 124 bpm 119 bpm 122 bpm 117 bpm    Oxygen Saturation (Admit) 94 % 96 % 98 % 98 %    Oxygen Saturation (Exercise) 91 % 93 % 91 % 93 %    Oxygen Saturation (Exit) 96 % 95 % 97 % 98 %    Rating of Perceived Exertion (Exercise) 13 19 19 17     Perceived Dyspnea  (Exercise) 1 4 3 3     Duration Progress to 30 minutes of  aerobic without signs/symptoms of physical distress Progress to 30 minutes of  aerobic without signs/symptoms of physical distress Progress to 30 minutes of  aerobic without signs/symptoms of physical distress Continue with 30 min of aerobic exercise without signs/symptoms of physical distress.    Intensity THRR unchanged THRR unchanged THRR unchanged THRR unchanged      Progression   Progression -- -- Continue to progress workloads to maintain intensity without signs/symptoms of physical distress. Continue to progress workloads to maintain intensity without signs/symptoms of physical distress.      Resistance Training   Training Prescription Yes Yes Yes Yes    Weight red bands red bands red bands red bands    Reps 10-15 10-15 10-15 10-15    Time 10 Minutes 10 Minutes 10 Minutes 10 Minutes      Oxygen   Oxygen Continuous Continuous Continuous Continuous    Liters 2 2 2  2-4      NuStep   Level 1 2 3 3     Minutes 30 15 15 15     METs 1.9 1.9 2.6 2.7      Track   Laps -- 1 2.25 2    Minutes -- 15 15 15     METs -- 1.15 1.35 1.35  Oxygen   Maintain Oxygen Saturation 88% or higher -- 88% or higher 88% or higher             Exercise Comments:   Exercise Goals and Review:   Exercise Goals     Row Name 02/12/23 0920 02/16/23 1533 03/13/23 0853         Exercise Goals   Increase Physical Activity Yes Yes Yes     Intervention Provide advice, education, support and counseling about physical activity/exercise needs.;Develop an individualized exercise prescription for aerobic and resistive training based on initial evaluation findings, risk stratification, comorbidities and participant's personal goals. Provide advice, education, support and counseling about physical activity/exercise needs.;Develop an individualized exercise prescription for aerobic and resistive training based on initial evaluation findings, risk  stratification, comorbidities and participant's personal goals. Provide advice, education, support and counseling about physical activity/exercise needs.;Develop an individualized exercise prescription for aerobic and resistive training based on initial evaluation findings, risk stratification, comorbidities and participant's personal goals.     Expected Outcomes Short Term: Attend rehab on a regular basis to increase amount of physical activity.;Long Term: Exercising regularly at least 3-5 days a week.;Long Term: Add in home exercise to make exercise part of routine and to increase amount of physical activity. Short Term: Attend rehab on a regular basis to increase amount of physical activity.;Long Term: Exercising regularly at least 3-5 days a week.;Long Term: Add in home exercise to make exercise part of routine and to increase amount of physical activity. Short Term: Attend rehab on a regular basis to increase amount of physical activity.;Long Term: Exercising regularly at least 3-5 days a week.;Long Term: Add in home exercise to make exercise part of routine and to increase amount of physical activity.     Increase Strength and Stamina Yes Yes Yes     Intervention Provide advice, education, support and counseling about physical activity/exercise needs.;Develop an individualized exercise prescription for aerobic and resistive training based on initial evaluation findings, risk stratification, comorbidities and participant's personal goals. Provide advice, education, support and counseling about physical activity/exercise needs.;Develop an individualized exercise prescription for aerobic and resistive training based on initial evaluation findings, risk stratification, comorbidities and participant's personal goals. Provide advice, education, support and counseling about physical activity/exercise needs.;Develop an individualized exercise prescription for aerobic and resistive training based on initial  evaluation findings, risk stratification, comorbidities and participant's personal goals.     Expected Outcomes Short Term: Increase workloads from initial exercise prescription for resistance, speed, and METs.;Short Term: Perform resistance training exercises routinely during rehab and add in resistance training at home;Long Term: Improve cardiorespiratory fitness, muscular endurance and strength as measured by increased METs and functional capacity ( ) Short Term: Increase workloads from initial exercise prescription for resistance, speed, and METs.;Short Term: Perform resistance training exercises routinely during rehab and add in resistance training at home;Long Term: Improve cardiorespiratory fitness, muscular endurance and strength as measured by increased METs and functional capacity ( ) Short Term: Increase workloads from initial exercise prescription for resistance, speed, and METs.;Short Term: Perform resistance training exercises routinely during rehab and add in resistance training at home;Long Term: Improve cardiorespiratory fitness, muscular endurance and strength as measured by increased METs and functional capacity ( )     Able to understand and use rate of perceived exertion (RPE) scale Yes Yes Yes     Intervention Provide education and explanation on how to use RPE scale Provide education and explanation on how to use RPE scale Provide education and explanation on how to use  RPE scale     Expected Outcomes Short Term: Able to use RPE daily in rehab to express subjective intensity level;Long Term:  Able to use RPE to guide intensity level when exercising independently Short Term: Able to use RPE daily in rehab to express subjective intensity level;Long Term:  Able to use RPE to guide intensity level when exercising independently Short Term: Able to use RPE daily in rehab to express subjective intensity level;Long Term:  Able to use RPE to guide intensity level when exercising  independently     Able to understand and use Dyspnea scale Yes Yes Yes     Intervention Provide education and explanation on how to use Dyspnea scale Provide education and explanation on how to use Dyspnea scale Provide education and explanation on how to use Dyspnea scale     Expected Outcomes Short Term: Able to use Dyspnea scale daily in rehab to express subjective sense of shortness of breath during exertion;Long Term: Able to use Dyspnea scale to guide intensity level when exercising independently Short Term: Able to use Dyspnea scale daily in rehab to express subjective sense of shortness of breath during exertion;Long Term: Able to use Dyspnea scale to guide intensity level when exercising independently Short Term: Able to use Dyspnea scale daily in rehab to express subjective sense of shortness of breath during exertion;Long Term: Able to use Dyspnea scale to guide intensity level when exercising independently     Knowledge and understanding of Target Heart Rate Range (THRR) Yes Yes Yes     Intervention Provide education and explanation of THRR including how the numbers were predicted and where they are located for reference Provide education and explanation of THRR including how the numbers were predicted and where they are located for reference Provide education and explanation of THRR including how the numbers were predicted and where they are located for reference     Expected Outcomes Short Term: Able to state/look up THRR;Short Term: Able to use daily as guideline for intensity in rehab;Long Term: Able to use THRR to govern intensity when exercising independently Short Term: Able to state/look up THRR;Short Term: Able to use daily as guideline for intensity in rehab;Long Term: Able to use THRR to govern intensity when exercising independently Short Term: Able to state/look up THRR;Short Term: Able to use daily as guideline for intensity in rehab;Long Term: Able to use THRR to govern intensity  when exercising independently     Understanding of Exercise Prescription Yes Yes Yes     Intervention Provide education, explanation, and written materials on patient's individual exercise prescription Provide education, explanation, and written materials on patient's individual exercise prescription Provide education, explanation, and written materials on patient's individual exercise prescription     Expected Outcomes Short Term: Able to explain program exercise prescription;Long Term: Able to explain home exercise prescription to exercise independently Short Term: Able to explain program exercise prescription;Long Term: Able to explain home exercise prescription to exercise independently Short Term: Able to explain program exercise prescription;Long Term: Able to explain home exercise prescription to exercise independently              Exercise Goals Re-Evaluation :  Exercise Goals Re-Evaluation     Row Name 02/16/23 1533 03/13/23 0853 04/10/23 0941         Exercise Goal Re-Evaluation   Exercise Goals Review Increase Physical Activity;Able to understand and use Dyspnea scale;Understanding of Exercise Prescription;Increase Strength and Stamina;Knowledge and understanding of Target Heart Rate Range (THRR);Able to understand and  use rate of perceived exertion (RPE) scale Increase Physical Activity;Able to understand and use Dyspnea scale;Understanding of Exercise Prescription;Increase Strength and Stamina;Knowledge and understanding of Target Heart Rate Range (THRR);Able to understand and use rate of perceived exertion (RPE) scale Increase Physical Activity;Able to understand and use Dyspnea scale;Understanding of Exercise Prescription;Increase Strength and Stamina;Knowledge and understanding of Target Heart Rate Range (THRR);Able to understand and use rate of perceived exertion (RPE) scale     Comments Pt to begin exercise 8/6. Will monitor for progression. Pt has completed 5 group exercise  sessions, missing 1 session. She started on the recumbent stepper for 30 min however has now progressed to walking the track for 10 min and the stepper for 20 min. She is severely exerted with high HR on the track and needs long rest breaks, current METs 1.35. Will continue to work with track walking as pt tolerates. She tolerated the recumbent stepper well, level 2, METs 2.2. She requires sitting rests during warm up and cool down. Pt has completed 9 group exercise sessions, missing 4 sessions, the last 3 sessions due to illness. Before becoming sick, pt was exercising on the recumbent stepper for 20 min then walking the track for 10 min. She was slowly progressing her ability to walk on the track but needs rest breaks, METs 1.46. Will continue to work with track walking as pt tolerates. Her MEts on the recumbent stepper are 2.7, level 3.  She requires sitting rests during warm up and cool down.     Expected Outcomes Through exercise at rehab and home, the patient will decrease shortness of breath with daily activities and feel confident in carrying out an exercise regimen at home Through exercise at rehab and home, the patient will decrease shortness of breath with daily activities and feel confident in carrying out an exercise regimen at home Through exercise at rehab and home, the patient will decrease shortness of breath with daily activities and feel confident in carrying out an exercise regimen at home              Discharge Exercise Prescription (Final Exercise Prescription Changes):  Exercise Prescription Changes - 03/22/23 1420       Response to Exercise   Blood Pressure (Admit) 108/68    Blood Pressure (Exit) 102/60    Heart Rate (Admit) 99 bpm    Heart Rate (Exercise) 135 bpm    Heart Rate (Exit) 117 bpm    Oxygen Saturation (Admit) 98 %    Oxygen Saturation (Exercise) 93 %    Oxygen Saturation (Exit) 98 %    Rating of Perceived Exertion (Exercise) 17    Perceived Dyspnea  (Exercise) 3    Duration Continue with 30 min of aerobic exercise without signs/symptoms of physical distress.    Intensity THRR unchanged      Progression   Progression Continue to progress workloads to maintain intensity without signs/symptoms of physical distress.      Resistance Training   Training Prescription Yes    Weight red bands    Reps 10-15    Time 10 Minutes      Oxygen   Oxygen Continuous    Liters 2-4      NuStep   Level 3    Minutes 15    METs 2.7      Track   Laps 2    Minutes 15    METs 1.35      Oxygen   Maintain Oxygen Saturation 88% or higher  Nutrition:  Target Goals: Understanding of nutrition guidelines, daily intake of sodium 1500mg , cholesterol 200mg , calories 30% from fat and 7% or less from saturated fats, daily to have 5 or more servings of fruits and vegetables.  Biometrics:  Pre Biometrics - 02/12/23 1036       Pre Biometrics   Grip Strength 21 kg              Nutrition Therapy Plan and Nutrition Goals:  Nutrition Therapy & Goals - 03/27/23 1501       Nutrition Therapy   Diet Heart Healthy Diet    Drug/Food Interactions Statins/Certain Fruits      Personal Nutrition Goals   Nutrition Goal Patient to improve diet quality by using the plate method as a guide for meal planning to include lean protein/plant protein, fruits, vegetables, whole grains, nonfat dairy as part of a well-balanced diet.   goal in action.   Personal Goal #2 Patient to identify strategies for weight loss of 0.5-2.0# per week.   goal in action.   Comments Goals in action. Erianne is down 5.3# since starting with our program and down ~18# since starting Ozempic in May 2024 at 181#. She has made many dietary changes including reduced carbohydrate intake and decreased deep frying foods. Her A1c has improved to a normal range at 5.4. She remains motivated to improve nutrition and lose weight. She is eating 2 meals daily and her husband is doing  the majority of the cooking and she does most of the meal planning. Patient will benefit from participation in pulmonary rehab for nutrition, exercise, and lifestyle modification.      Intervention Plan   Intervention Prescribe, educate and counsel regarding individualized specific dietary modifications aiming towards targeted core components such as weight, hypertension, lipid management, diabetes, heart failure and other comorbidities.;Nutrition handout(s) given to patient.    Expected Outcomes Short Term Goal: Understand basic principles of dietary content, such as calories, fat, sodium, cholesterol and nutrients.;Long Term Goal: Adherence to prescribed nutrition plan.             Nutrition Assessments:  MEDIFICTS Score Key: >=70 Need to make dietary changes  40-70 Heart Healthy Diet <= 40 Therapeutic Level Cholesterol Diet   Picture Your Plate Scores: <66 Unhealthy dietary pattern with much room for improvement. 41-50 Dietary pattern unlikely to meet recommendations for good health and room for improvement. 51-60 More healthful dietary pattern, with some room for improvement.  >60 Healthy dietary pattern, although there may be some specific behaviors that could be improved.    Nutrition Goals Re-Evaluation:  Nutrition Goals Re-Evaluation     Row Name 02/20/23 1512 03/27/23 1501           Goals   Current Weight 167 lb 1.7 oz (75.8 kg) 163 lb 2.3 oz (74 kg)      Comment A1c 5.8, most recent lipid panel from 01/2022 triglycerides 187 triglycerides 162, HDL 36.9, A1c WNL at 5.4      Expected Outcome Maddix started ozempic in May 2024 at 181#; she is down ~14#. She has made many dietary changes including reduced carbohydrate intake and decreased deep frying foods. She remains motivated to improve nutrition and lose weight. She eating 2 meals daily and her husband is doing the majority of the cooking and she does most of the meal planning. Patient will benefit from participation in  pulmonary rehab for nutrition, exercise, and lifestyle modification. Goals in action. Shadow is down 5.3# since starting with our program  and down ~18# since starting Ozempic in May 2024 at 181#. She has made many dietary changes including reduced carbohydrate intake and decreased deep frying foods. Her A1c has improved to a normal range at 5.4. She remains motivated to improve nutrition and lose weight. She is eating 2 meals daily and her husband is doing the majority of the cooking and she does most of the meal planning. Patient will benefit from participation in pulmonary rehab for nutrition, exercise, and lifestyle modification.               Nutrition Goals Discharge (Final Nutrition Goals Re-Evaluation):  Nutrition Goals Re-Evaluation - 03/27/23 1501       Goals   Current Weight 163 lb 2.3 oz (74 kg)    Comment triglycerides 162, HDL 36.9, A1c WNL at 5.4    Expected Outcome Goals in action. Edona is down 5.3# since starting with our program and down ~18# since starting Ozempic in May 2024 at 181#. She has made many dietary changes including reduced carbohydrate intake and decreased deep frying foods. Her A1c has improved to a normal range at 5.4. She remains motivated to improve nutrition and lose weight. She is eating 2 meals daily and her husband is doing the majority of the cooking and she does most of the meal planning. Patient will benefit from participation in pulmonary rehab for nutrition, exercise, and lifestyle modification.             Psychosocial: Target Goals: Acknowledge presence or absence of significant depression and/or stress, maximize coping skills, provide positive support system. Participant is able to verbalize types and ability to use techniques and skills needed for reducing stress and depression.  Initial Review & Psychosocial Screening:  Initial Psych Review & Screening - 02/12/23 0919       Initial Review   Current issues with Current Stress Concerns     Source of Stress Concerns Chronic Illness    Comments Pt states she worriers about her health      Family Dynamics   Good Support System? Yes    Comments Husband Casimiro Needle is biggest source of support      Barriers   Psychosocial barriers to participate in program The patient should benefit from training in stress management and relaxation.      Screening Interventions   Interventions Encouraged to exercise    Expected Outcomes Short Term goal: Utilizing psychosocial counselor, staff and physician to assist with identification of specific Stressors or current issues interfering with healing process. Setting desired goal for each stressor or current issue identified.;Long Term Goal: Stressors or current issues are controlled or eliminated.             Quality of Life Scores:  Scores of 19 and below usually indicate a poorer quality of life in these areas.  A difference of  2-3 points is a clinically meaningful difference.  A difference of 2-3 points in the total score of the Quality of Life Index has been associated with significant improvement in overall quality of life, self-image, physical symptoms, and general health in studies assessing change in quality of life.  PHQ-9: Review Flowsheet  More data may exist      02/28/2023 02/12/2023 11/28/2022 02/02/2022 04/17/2016  Depression screen PHQ 2/9  Decreased Interest 0 0 0 0 3  Down, Depressed, Hopeless 0 0 0 0 3  PHQ - 2 Score 0 0 0 0 6  Altered sleeping 0 0 - 0 3  Tired, decreased energy  2 0 - 0 3  Change in appetite 0 0 - 0 2  Feeling bad or failure about yourself  0 0 - 0 3  Trouble concentrating 0 0 - 0 0  Moving slowly or fidgety/restless 0 0 - 0 0  Suicidal thoughts 0 0 - 0 1  PHQ-9 Score 2 0 - 0 18  Difficult doing work/chores Not difficult at all - - Not difficult at all Very difficult    Details           Interpretation of Total Score  Total Score Depression Severity:  1-4 = Minimal depression, 5-9 = Mild  depression, 10-14 = Moderate depression, 15-19 = Moderately severe depression, 20-27 = Severe depression   Psychosocial Evaluation and Intervention:  Psychosocial Evaluation - 02/12/23 0919       Psychosocial Evaluation & Interventions   Interventions Relaxation education    Comments Jadaya discloses her stress comes from her health issues, more recently fighting breast and past lung cancers. She is worried about her declince in health.    Expected Outcomes For Alayzha to participate in PR free of any stressors or other psychosocial barriers    Continue Psychosocial Services  No Follow up required             Psychosocial Re-Evaluation:  Psychosocial Re-Evaluation     Row Name 02/16/23 1453 03/14/23 1424 04/11/23 1444         Psychosocial Re-Evaluation   Current issues with Current Stress Concerns Current Stress Concerns Current Stress Concerns     Comments No changes since orientation on 7/29. Ngela is scheduled to start PR class on 8/6 Finna continues to worry about her health. She states she feels guilty about her husband having to take care of her. She feels like she should be able to take care of herself, but unfortunately due to her health she is unable to at this time. She denies a referral to a therapist at this time. Staff will educate Mayvis on healthy ways to manage her stress. Rubith continues to worry about her health. She has been having heart issues and is scheduled to see her cardiologist. She still denies a referral to a therapist at this time.     Expected Outcomes For Harlan to attend PR without any psychosocial barriers or concerns For Myah to attend PR without any psychosocial barriers or concerns For Kacelyn to attend PR without any psychosocial barriers or concerns     Interventions Encouraged to attend Pulmonary Rehabilitation for the exercise Stress management education;Encouraged to attend Pulmonary Rehabilitation for the exercise Encouraged to attend  Pulmonary Rehabilitation for the exercise     Continue Psychosocial Services  No Follow up required No Follow up required No Follow up required              Psychosocial Discharge (Final Psychosocial Re-Evaluation):  Psychosocial Re-Evaluation - 04/11/23 1444       Psychosocial Re-Evaluation   Current issues with Current Stress Concerns    Comments Valari continues to worry about her health. She has been having heart issues and is scheduled to see her cardiologist. She still denies a referral to a therapist at this time.    Expected Outcomes For Georgiana to attend PR without any psychosocial barriers or concerns    Interventions Encouraged to attend Pulmonary Rehabilitation for the exercise    Continue Psychosocial Services  No Follow up required             Education: Education Goals:  Education classes will be provided on a weekly basis, covering required topics. Participant will state understanding/return demonstration of topics presented.  Learning Barriers/Preferences:  Learning Barriers/Preferences - 02/12/23 1041       Learning Barriers/Preferences   Learning Barriers None    Learning Preferences Group Instruction;Written Material             Education Topics: Know Your Numbers Group instruction that is supported by a PowerPoint presentation. Instructor discusses importance of knowing and understanding resting, exercise, and post-exercise oxygen saturation, heart rate, and blood pressure. Oxygen saturation, heart rate, blood pressure, rating of perceived exertion, and dyspnea are reviewed along with a normal range for these values.    Exercise for the Pulmonary Patient Group instruction that is supported by a PowerPoint presentation. Instructor discusses benefits of exercise, core components of exercise, frequency, duration, and intensity of an exercise routine, importance of utilizing pulse oximetry during exercise, safety while exercising, and options of places  to exercise outside of rehab.  Flowsheet Row PULMONARY REHAB OTHER RESPIRATORY from 07/27/2016 in [facility]  Date 06/15/16  Educator EP  Instruction Review Code (Retired) 2- meets goals/outcomes       MET Level  Group instruction provided by PowerPoint, verbal discussion, and written material to support subject matter. Instructor reviews what METs are and how to increase METs.  Flowsheet Row PULMONARY REHAB OTHER RESPIRATORY from 03/15/2023 in [facility] for [facility], [facility], [facility]  Date 03/15/23  Educator EP  Instruction Review Code 1- Verbalizes Understanding       Pulmonary Medications Verbally interactive group education provided by instructor with focus on inhaled medications and proper administration.   Anatomy and Physiology of the Respiratory System Group instruction provided by PowerPoint, verbal discussion, and written material to support subject matter. Instructor reviews respiratory cycle and anatomical components of the respiratory system and their functions. Instructor also reviews differences in obstructive and restrictive respiratory diseases with examples of each.  Flowsheet Row PULMONARY REHAB OTHER RESPIRATORY from 07/27/2016 in [facility] for Heart, Vascular, & Lung Health  Date 06/22/16  Educator rn  Instruction Review Code (Retired) 2- meets goals/outcomes       Oxygen Safety Group instruction provided by PowerPoint, verbal discussion, and written material to support subject matter. There is an overview of "What is Oxygen" and "Why do we need it".  Instructor also reviews how to create a safe environment for oxygen use, the importance of using oxygen as prescribed, and the risks of noncompliance. There is a brief discussion on traveling with oxygen and resources the patient may utilize. Flowsheet Row PULMONARY REHAB OTHER RESPIRATORY from  07/27/2016 in [facility] for Heart, Vascular, & Lung Health  Date 05/18/16  Educator rn  Instruction Review Code (Retired) 2- meets goals/outcomes       Oxygen Use Group instruction provided by PowerPoint, verbal discussion, and written material to discuss how supplemental oxygen is prescribed and different types of oxygen supply systems. Resources for more information are provided.    Breathing Techniques Group instruction that is supported by demonstration and informational handouts. Instructor discusses the benefits of pursed lip and diaphragmatic breathing and detailed demonstration on how to perform both.     Risk Factor Reduction Group instruction that is supported by a PowerPoint presentation. Instructor discusses the definition of a risk factor, different risk factors for pulmonary disease, and how the heart and lungs work  together. Flowsheet Row PULMONARY REHAB OTHER RESPIRATORY from 03/08/2023 in [facility] for Heart, Vascular, & Lung Health  Date 03/08/23  Educator EP  Instruction Review Code 1- Verbalizes Understanding       Pulmonary Diseases Group instruction provided by PowerPoint, verbal discussion, and written material to support subject matter. Instructor gives an overview of the different type of pulmonary diseases. There is also a discussion on risk factors and symptoms as well as ways to manage the diseases.   Stress and Energy Conservation Group instruction provided by PowerPoint, verbal discussion, and written material to support subject matter. Instructor gives an overview of stress and the impact it can have on the body. Instructor also reviews ways to reduce stress. There is also a discussion on energy conservation and ways to conserve energy throughout the day.   Warning Signs and Symptoms Group instruction provided by PowerPoint, verbal discussion, and written material to support subject matter. Instructor  reviews warning signs and symptoms of stroke, heart attack, cold and flu. Instructor also reviews ways to prevent the spread of infection.   Other Education Group or individual verbal, written, or video instructions that support the educational goals of the pulmonary rehab program. Flowsheet Row PULMONARY REHAB OTHER RESPIRATORY from 03/22/2023 in [facility] for [facility], Vascular, [facility] Lung [facility]  Date 03/22/23  Educator RT  Instruction Review Code 1- Verbalizes Understanding        Knowledge Questionnaire Score:  Knowledge Questionnaire Score - 02/12/23 1042       Knowledge Questionnaire Score   Pre Score 17/18             Core Components/Risk Factors/Patient Goals at Admission:  Personal Goals and Risk Factors at Admission - 02/12/23 0920       Core Components/Risk Factors/Patient Goals on Admission    Weight Management Weight Loss    Improve shortness of breath with ADL's Yes    Intervention Provide education, individualized exercise plan and daily activity instruction to help decrease symptoms of SOB with activities of daily living.    Expected Outcomes Short Term: Improve cardiorespiratory fitness to achieve a reduction of symptoms when performing ADLs;Long Term: Be able to perform more ADLs without symptoms or delay the onset of symptoms             Core Components/Risk Factors/Patient Goals Review:   Goals and Risk Factor Review     Row Name 02/16/23 1455 03/14/23 1429 04/11/23 1502         Core Components/Risk Factors/Patient Goals Review   Personal Goals Review Weight Management/Obesity;Improve shortness of breath with ADL's;Develop more efficient breathing techniques such as purse lipped breathing and diaphragmatic breathing and practicing self-pacing with activity. Weight Management/Obesity;Improve shortness of breath with ADL's;Develop more efficient breathing techniques such as purse lipped breathing and diaphragmatic breathing and  practicing self-pacing with activity. Weight Management/Obesity;Improve shortness of breath with ADL's;Develop more efficient breathing techniques such as purse lipped breathing and diaphragmatic breathing and practicing self-pacing with activity.     Review Unable to evaluate progress yet. Pt will start her first class on 8/6/ Goal progressing for weight loss. Canna has been working with our dietician for weight loss goals. Goal progressing on improving her shortness of breath with ADLs. Goal progressing on developing more efficient breathing techniques such as purse lipped breathing and diaphragmatic breathing; and practicing self-pacing with activity. Simya has to be reminded to use purse lip breathing when she gets short of breath. Onda has only  attended 6 sessions so far. We will continue to monitor her progress throughout the program. Goal progressing for weight loss. Haivyn is down 5.3# since starting the program. Lizbette has been working with our dietician for weight loss goals. Goal progressing on improving her shortness of breath with ADLs. Goal progressing on developing more efficient breathing techniques such as purse lipped breathing and diaphragmatic breathing; and practicing self-pacing with activity. Delanny has to be reminded to use purse lip breathing when she gets short of breath. Michalina has only attended 10 sessions so far. She has missed a couple of weeks due to illness. We will continue to monitor her progress throughout the program.     Expected Outcomes see admission goals see admission goals see admission goals              Core Components/Risk Factors/Patient Goals at Discharge (Final Review):   Goals and Risk Factor Review - 04/11/23 1502       Core Components/Risk Factors/Patient Goals Review   Personal Goals Review Weight Management/Obesity;Improve shortness of breath with ADL's;Develop more efficient breathing techniques such as purse lipped breathing and diaphragmatic  breathing and practicing self-pacing with activity.    Review Goal progressing for weight loss. Akyrah is down 5.3# since starting the program. Mala has been working with our dietician for weight loss goals. Goal progressing on improving her shortness of breath with ADLs. Goal progressing on developing more efficient breathing techniques such as purse lipped breathing and diaphragmatic breathing; and practicing self-pacing with activity. Raiven has to be reminded to use purse lip breathing when she gets short of breath. Cordilia has only attended 10 sessions so far. She has missed a couple of weeks due to illness. We will continue to monitor her progress throughout the program.    Expected Outcomes see admission goals             ITP Comments:Pt is making expected progress toward Pulmonary Rehab goals after completing 10 sessions. Recommend continued exercise, life style modification, education, and utilization of breathing techniques to increase stamina and strength, while also decreasing shortness of breath with exertion.  Dr. Mechele Collin is Medical Director for Pulmonary Rehab at [facility].     Comments: Dr. Mechele Collin is Medical Director for Pulmonary Rehab at [facility].

## 2023-04-19 ENCOUNTER — Encounter (HOSPITAL_COMMUNITY): Payer: BC Managed Care – PPO

## 2023-04-19 ENCOUNTER — Ambulatory Visit (HOSPITAL_COMMUNITY): Payer: BC Managed Care – PPO

## 2023-04-22 ENCOUNTER — Other Ambulatory Visit: Payer: Self-pay | Admitting: Primary Care

## 2023-04-22 DIAGNOSIS — I700 Atherosclerosis of aorta: Secondary | ICD-10-CM

## 2023-04-22 DIAGNOSIS — I5032 Chronic diastolic (congestive) heart failure: Secondary | ICD-10-CM

## 2023-04-22 DIAGNOSIS — E785 Hyperlipidemia, unspecified: Secondary | ICD-10-CM

## 2023-04-22 DIAGNOSIS — J984 Other disorders of lung: Secondary | ICD-10-CM

## 2023-04-23 DIAGNOSIS — R002 Palpitations: Secondary | ICD-10-CM | POA: Diagnosis not present

## 2023-04-24 ENCOUNTER — Ambulatory Visit (HOSPITAL_COMMUNITY): Payer: BC Managed Care – PPO

## 2023-04-24 ENCOUNTER — Encounter (HOSPITAL_COMMUNITY): Payer: BC Managed Care – PPO

## 2023-04-24 NOTE — Addendum Note (Signed)
Encounter addended by: [place], Clemons Salvucci G, RD on: 04/24/2023 1:41 PM  Actions taken: Flowsheet data copied forward, Flowsheet accepted

## 2023-04-26 ENCOUNTER — Ambulatory Visit (HOSPITAL_COMMUNITY): Payer: BC Managed Care – PPO

## 2023-04-26 ENCOUNTER — Encounter (HOSPITAL_COMMUNITY): Payer: BC Managed Care – PPO

## 2023-05-01 ENCOUNTER — Ambulatory Visit (HOSPITAL_COMMUNITY): Payer: BC Managed Care – PPO

## 2023-05-01 ENCOUNTER — Encounter (HOSPITAL_COMMUNITY): Payer: BC Managed Care – PPO

## 2023-05-03 ENCOUNTER — Encounter (HOSPITAL_COMMUNITY): Payer: BC Managed Care – PPO

## 2023-05-03 ENCOUNTER — Ambulatory Visit (HOSPITAL_COMMUNITY): Payer: BC Managed Care – PPO

## 2023-05-08 ENCOUNTER — Telehealth (HOSPITAL_COMMUNITY): Payer: Self-pay | Admitting: *Deleted

## 2023-05-08 ENCOUNTER — Encounter (HOSPITAL_COMMUNITY): Payer: BC Managed Care – PPO

## 2023-05-08 NOTE — Telephone Encounter (Signed)
Called pt to discuss Pulmonary Rehab plans going forward. She is still waiting on her echo and then will need a plan from cardiology. She has missed 6 weeks of class. Discussed discharge and pt can return to [facility] when approved by MD with a new referral. Pt in agreement.  Ethelda Chick BS, ACSM-CEP 05/08/2023 3:59 PM

## 2023-05-09 ENCOUNTER — Ambulatory Visit: Payer: BC Managed Care – PPO | Attending: Cardiology

## 2023-05-09 ENCOUNTER — Other Ambulatory Visit: Payer: Self-pay | Admitting: Cardiology

## 2023-05-09 DIAGNOSIS — I5032 Chronic diastolic (congestive) heart failure: Secondary | ICD-10-CM

## 2023-05-09 DIAGNOSIS — E782 Mixed hyperlipidemia: Secondary | ICD-10-CM

## 2023-05-09 DIAGNOSIS — J9611 Chronic respiratory failure with hypoxia: Secondary | ICD-10-CM

## 2023-05-09 DIAGNOSIS — I10 Essential (primary) hypertension: Secondary | ICD-10-CM

## 2023-05-09 DIAGNOSIS — J449 Chronic obstructive pulmonary disease, unspecified: Secondary | ICD-10-CM

## 2023-05-09 DIAGNOSIS — Z171 Estrogen receptor negative status [ER-]: Secondary | ICD-10-CM

## 2023-05-09 DIAGNOSIS — R002 Palpitations: Secondary | ICD-10-CM | POA: Diagnosis not present

## 2023-05-09 DIAGNOSIS — R000 Tachycardia, unspecified: Secondary | ICD-10-CM

## 2023-05-09 LAB — ECHOCARDIOGRAM COMPLETE
Area-P 1/2: 4.89 cm2
S' Lateral: 2.9 cm

## 2023-05-09 NOTE — Addendum Note (Signed)
Encounter addended by: Essie Hart, RN on: 05/09/2023 12:38 PM  Actions taken: Flowsheet data copied forward, Flowsheet accepted, Clinical Note Signed

## 2023-05-09 NOTE — Progress Notes (Signed)
Discharge Progress Report  Patient Details  Name: Deborah [name] MRN: [id number] Date of Birth: 1956-04-26 Referring Provider:   Doristine Devoid Pulmonary Rehab Walk Test from 02/12/2023 in [facility]  Referring Provider Dr. Judeth Horn        Number of Visits: 10  Reason for Discharge:  Early Exit:  illness  Smoking History:  Social History   Tobacco Use  Smoking Status Former   Current packs/day: 0.00   Average packs/day: 3.0 packs/day for 43.0 years (129.0 ttl pk-yrs)   Types: Cigarettes   Start date: 12/17/1969   Quit date: 12/17/2012   Years since quitting: 10.3  Smokeless Tobacco Never    Diagnosis:  Irregular heart beat - Plan: EKG 12-Lead, EKG 12-Lead, CANCELED: EKG 12-Lead, CANCELED: EKG 12-Lead  Restrictive lung disease  ADL UCSD:  Pulmonary Assessment Scores     Row Name 02/12/23 0925         ADL UCSD   SOB Score total 81       CAT Score   CAT Score 23       mMRC Score   mMRC Score 4              Initial Exercise Prescription:  Initial Exercise Prescription - 02/12/23 1000       Date of Initial Exercise RX and Referring Provider   Date 02/12/23    Referring Provider Dr. Judeth Horn    Expected Discharge Date 05/10/23      Oxygen   Oxygen Continuous    Liters 2    Maintain Oxygen Saturation 88% or higher      NuStep   Level 1    SPM 40    Minutes 30    METs 1.2      Prescription Details   Frequency (times per week) 2    Duration Progress to 30 minutes of continuous aerobic without signs/symptoms of physical distress      Intensity   THRR 40-80% of Max Heartrate 62-125    Ratings of Perceived Exertion 11-13    Perceived Dyspnea 0-4      Progression   Progression Continue to progress workloads to maintain intensity without signs/symptoms of physical distress.      Resistance Training   Training Prescription Yes    Weight red bands    Reps 10-15              Discharge Exercise Prescription (Final Exercise Prescription Changes):  Exercise Prescription Changes - 03/22/23 1420       Response to Exercise   Blood Pressure (Admit) 108/68    Blood Pressure (Exit) 102/60    Heart Rate (Admit) 99 bpm    Heart Rate (Exercise) 135 bpm    Heart Rate (Exit) 117 bpm    Oxygen Saturation (Admit) 98 %    Oxygen Saturation (Exercise) 93 %    Oxygen Saturation (Exit) 98 %    Rating of Perceived Exertion (Exercise) 17    Perceived Dyspnea (Exercise) 3    Duration Continue with 30 min of aerobic exercise without signs/symptoms of physical distress.    Intensity THRR unchanged      Progression   Progression Continue to progress workloads to maintain intensity without signs/symptoms of physical distress.      Resistance Training   Training Prescription Yes    Weight red bands    Reps 10-15    Time 10 Minutes      Oxygen  Oxygen Continuous    Liters 2-4      NuStep   Level 3    Minutes 15    METs 2.7      Track   Laps 2    Minutes 15    METs 1.35      Oxygen   Maintain Oxygen Saturation 88% or higher             Functional Capacity:  6 Minute Walk     Row Name 02/12/23 1021         6 Minute Walk   Phase Initial     Distance 285 feet     Walk Time 6 minutes     # of Rest Breaks 2  stopped at 1:35-2:14 and 3:38-5:17     MPH 0.54     METS 1.05     RPE 16     Perceived Dyspnea  3     Symptoms Yes (comment)     Comments SOB     Resting HR 104 bpm     Resting BP 110/62     Resting Oxygen Saturation  97 %     Exercise Oxygen Saturation  during 6 min walk 96 %     Max Ex. HR 153 bpm     Max Ex. BP 124/68     2 Minute Post BP 112/62       Interval HR   1 Minute HR 138     2 Minute HR 138     3 Minute HR 150     4 Minute HR 153     5 Minute HR 118     6 Minute HR 145     Interval Heart Rate? Yes       Interval Oxygen   Interval Oxygen? Yes     Baseline Oxygen Saturation % 97 %     1 Minute Oxygen Saturation %  96 %     1 Minute Liters of Oxygen 2 L     2 Minute Oxygen Saturation % 96 %     2 Minute Liters of Oxygen 2 L     3 Minute Oxygen Saturation % 96 %     3 Minute Liters of Oxygen 2 L     4 Minute Oxygen Saturation % 96 %     4 Minute Liters of Oxygen 2 L     5 Minute Oxygen Saturation % 96 %     5 Minute Liters of Oxygen 2 L     6 Minute Oxygen Saturation % 97 %     6 Minute Liters of Oxygen 2 L     2 Minute Post Oxygen Saturation % 98 %     2 Minute Post Liters of Oxygen 2 L              Psychological, QOL, Others - Outcomes: PHQ 2/9:    02/28/2023   12:03 PM 02/12/2023   10:42 AM 11/28/2022    2:40 PM 02/02/2022    2:22 PM 04/17/2016    1:03 PM  Depression screen PHQ 2/9  Decreased Interest 0 0 0 0 3  Down, Depressed, Hopeless 0 0 0 0 3  PHQ - 2 Score 0 0 0 0 6  Altered sleeping 0 0  0 3  Tired, decreased energy 2 0  0 3  Change in appetite 0 0  0 2  Feeling bad or failure about yourself  0 0  0 3  Trouble concentrating 0 0  0 0  Moving slowly or fidgety/restless 0 0  0 0  Suicidal thoughts 0 0  0 1  PHQ-9 Score 2 0  0 18  Difficult doing work/chores Not difficult at all   Not difficult at all Very difficult    Quality of Life:   Personal Goals: Goals established at orientation with interventions provided to work toward goal.  Personal Goals and Risk Factors at Admission - 02/12/23 0920       Core Components/Risk Factors/Patient Goals on Admission    Weight Management Weight Loss    Improve shortness of breath with ADL's Yes    Intervention Provide education, individualized exercise plan and daily activity instruction to help decrease symptoms of SOB with activities of daily living.    Expected Outcomes Short Term: Improve cardiorespiratory fitness to achieve a reduction of symptoms when performing ADLs;Long Term: Be able to perform more ADLs without symptoms or delay the onset of symptoms              Personal Goals Discharge:  Goals and Risk Factor  Review     Row Name 02/16/23 1455 03/14/23 1429 04/11/23 1502 05/09/23 1229       Core Components/Risk Factors/Patient Goals Review   Personal Goals Review Weight Management/Obesity;Improve shortness of breath with ADL's;Develop more efficient breathing techniques such as purse lipped breathing and diaphragmatic breathing and practicing self-pacing with activity. Weight Management/Obesity;Improve shortness of breath with ADL's;Develop more efficient breathing techniques such as purse lipped breathing and diaphragmatic breathing and practicing self-pacing with activity. Weight Management/Obesity;Improve shortness of breath with ADL's;Develop more efficient breathing techniques such as purse lipped breathing and diaphragmatic breathing and practicing self-pacing with activity. Weight Management/Obesity;Improve shortness of breath with ADL's;Develop more efficient breathing techniques such as purse lipped breathing and diaphragmatic breathing and practicing self-pacing with activity.    Review Unable to evaluate progress yet. Pt will start her first class on 8/6/ Goal progressing for weight loss. Deborah [name] has been working with our dietician for weight loss goals. Goal progressing on improving her shortness of breath with ADLs. Goal progressing on developing more efficient breathing techniques such as purse lipped breathing and diaphragmatic breathing; and practicing self-pacing with activity. Deborah [name] has to be reminded to use purse lip breathing when she gets short of breath. Deborah [name] has only attended 6 sessions so far. We will continue to monitor her progress throughout the program. Goal progressing for weight loss. Deborah [name] is down 5.3# since starting the program. Deborah [name] has been working with our dietician for weight loss goals. Goal progressing on improving her shortness of breath with ADLs. Goal progressing on developing more efficient breathing techniques such as purse lipped breathing and diaphragmatic  breathing; and practicing self-pacing with activity. Deborah [name] has to be reminded to use purse lip breathing when she gets short of breath. Deborah [name] has only attended 10 sessions so far. She has missed a couple of weeks due to illness. We will continue to monitor her progress throughout the program. Deborah [name] has not been to class since 03/27/23. She was discharged from the program on 05/08/23 after completing 9 sessions. Goal met for weight loss. Deborah [name] is down 5.3# since starting the program. Deborah [name] has been working with our dietician for weight loss goals. Goal progressing on improving her shortness of breath with ADLs. Deborah [name] has maintained her oxygen saturation on 2-3L with exertion. Goal not met on developing more efficient breathing techniques such as purse lipped breathing and diaphragmatic breathing; and practicing self-pacing with  activity. Deborah [name] has to be reminded to use purse lip breathing when she gets short of breath.    Expected Outcomes see admission goals see admission goals see admission goals For Deborah [name] to continue exercising, improve her SOB with ADLs, and work on her purse lipped breathing and pacing herself post PR.             Exercise Goals and Review:  Exercise Goals     Row Name 02/12/23 0920 02/16/23 1533 03/13/23 0853         Exercise Goals   Increase Physical Activity Yes Yes Yes     Intervention Provide advice, education, support and counseling about physical activity/exercise needs.;Develop an individualized exercise prescription for aerobic and resistive training based on initial evaluation findings, risk stratification, comorbidities and participant's personal goals. Provide advice, education, support and counseling about physical activity/exercise needs.;Develop an individualized exercise prescription for aerobic and resistive training based on initial evaluation findings, risk stratification, comorbidities and participant's personal goals. Provide advice, education,  support and counseling about physical activity/exercise needs.;Develop an individualized exercise prescription for aerobic and resistive training based on initial evaluation findings, risk stratification, comorbidities and participant's personal goals.     Expected Outcomes Short Term: Attend rehab on a regular basis to increase amount of physical activity.;Long Term: Exercising regularly at least 3-5 days a week.;Long Term: Add in home exercise to make exercise part of routine and to increase amount of physical activity. Short Term: Attend rehab on a regular basis to increase amount of physical activity.;Long Term: Exercising regularly at least 3-5 days a week.;Long Term: Add in home exercise to make exercise part of routine and to increase amount of physical activity. Short Term: Attend rehab on a regular basis to increase amount of physical activity.;Long Term: Exercising regularly at least 3-5 days a week.;Long Term: Add in home exercise to make exercise part of routine and to increase amount of physical activity.     Increase Strength and Stamina Yes Yes Yes     Intervention Provide advice, education, support and counseling about physical activity/exercise needs.;Develop an individualized exercise prescription for aerobic and resistive training based on initial evaluation findings, risk stratification, comorbidities and participant's personal goals. Provide advice, education, support and counseling about physical activity/exercise needs.;Develop an individualized exercise prescription for aerobic and resistive training based on initial evaluation findings, risk stratification, comorbidities and participant's personal goals. Provide advice, education, support and counseling about physical activity/exercise needs.;Develop an individualized exercise prescription for aerobic and resistive training based on initial evaluation findings, risk stratification, comorbidities and participant's personal goals.      Expected Outcomes Short Term: Increase workloads from initial exercise prescription for resistance, speed, and METs.;Short Term: Perform resistance training exercises routinely during rehab and add in resistance training at home;Long Term: Improve cardiorespiratory fitness, muscular endurance and strength as measured by increased METs and functional capacity ( ) Short Term: Increase workloads from initial exercise prescription for resistance, speed, and METs.;Short Term: Perform resistance training exercises routinely during rehab and add in resistance training at home;Long Term: Improve cardiorespiratory fitness, muscular endurance and strength as measured by increased METs and functional capacity ( ) Short Term: Increase workloads from initial exercise prescription for resistance, speed, and METs.;Short Term: Perform resistance training exercises routinely during rehab and add in resistance training at home;Long Term: Improve cardiorespiratory fitness, muscular endurance and strength as measured by increased METs and functional capacity ( )     Able to understand and use rate of perceived exertion (RPE) scale Yes Yes Yes  Intervention Provide education and explanation on how to use RPE scale Provide education and explanation on how to use RPE scale Provide education and explanation on how to use RPE scale     Expected Outcomes Short Term: Able to use RPE daily in rehab to express subjective intensity level;Long Term:  Able to use RPE to guide intensity level when exercising independently Short Term: Able to use RPE daily in rehab to express subjective intensity level;Long Term:  Able to use RPE to guide intensity level when exercising independently Short Term: Able to use RPE daily in rehab to express subjective intensity level;Long Term:  Able to use RPE to guide intensity level when exercising independently     Able to understand and use Dyspnea scale Yes Yes Yes     Intervention Provide  education and explanation on how to use Dyspnea scale Provide education and explanation on how to use Dyspnea scale Provide education and explanation on how to use Dyspnea scale     Expected Outcomes Short Term: Able to use Dyspnea scale daily in rehab to express subjective sense of shortness of breath during exertion;Long Term: Able to use Dyspnea scale to guide intensity level when exercising independently Short Term: Able to use Dyspnea scale daily in rehab to express subjective sense of shortness of breath during exertion;Long Term: Able to use Dyspnea scale to guide intensity level when exercising independently Short Term: Able to use Dyspnea scale daily in rehab to express subjective sense of shortness of breath during exertion;Long Term: Able to use Dyspnea scale to guide intensity level when exercising independently     Knowledge and understanding of Target Heart Rate Range (THRR) Yes Yes Yes     Intervention Provide education and explanation of THRR including how the numbers were predicted and where they are located for reference Provide education and explanation of THRR including how the numbers were predicted and where they are located for reference Provide education and explanation of THRR including how the numbers were predicted and where they are located for reference     Expected Outcomes Short Term: Able to state/look up THRR;Short Term: Able to use daily as guideline for intensity in rehab;Long Term: Able to use THRR to govern intensity when exercising independently Short Term: Able to state/look up THRR;Short Term: Able to use daily as guideline for intensity in rehab;Long Term: Able to use THRR to govern intensity when exercising independently Short Term: Able to state/look up THRR;Short Term: Able to use daily as guideline for intensity in rehab;Long Term: Able to use THRR to govern intensity when exercising independently     Understanding of Exercise Prescription Yes Yes Yes      Intervention Provide education, explanation, and written materials on patient's individual exercise prescription Provide education, explanation, and written materials on patient's individual exercise prescription Provide education, explanation, and written materials on patient's individual exercise prescription     Expected Outcomes Short Term: Able to explain program exercise prescription;Long Term: Able to explain home exercise prescription to exercise independently Short Term: Able to explain program exercise prescription;Long Term: Able to explain home exercise prescription to exercise independently Short Term: Able to explain program exercise prescription;Long Term: Able to explain home exercise prescription to exercise independently              Exercise Goals Re-Evaluation:  Exercise Goals Re-Evaluation     Row Name 02/16/23 1533 03/13/23 0853 04/10/23 0941         Exercise Goal Re-Evaluation   Exercise  Goals Review Increase Physical Activity;Able to understand and use Dyspnea scale;Understanding of Exercise Prescription;Increase Strength and Stamina;Knowledge and understanding of Target Heart Rate Range (THRR);Able to understand and use rate of perceived exertion (RPE) scale Increase Physical Activity;Able to understand and use Dyspnea scale;Understanding of Exercise Prescription;Increase Strength and Stamina;Knowledge and understanding of Target Heart Rate Range (THRR);Able to understand and use rate of perceived exertion (RPE) scale Increase Physical Activity;Able to understand and use Dyspnea scale;Understanding of Exercise Prescription;Increase Strength and Stamina;Knowledge and understanding of Target Heart Rate Range (THRR);Able to understand and use rate of perceived exertion (RPE) scale     Comments Pt to begin exercise 8/6. Will monitor for progression. Pt has completed 5 group exercise sessions, missing 1 session. She started on the recumbent stepper for 30 min however has now  progressed to walking the track for 10 min and the stepper for 20 min. She is severely exerted with high HR on the track and needs long rest breaks, current METs 1.35. Will continue to work with track walking as pt tolerates. She tolerated the recumbent stepper well, level 2, METs 2.2. She requires sitting rests during warm up and cool down. Pt has completed 9 group exercise sessions, missing 4 sessions, the last 3 sessions due to illness. Before becoming sick, pt was exercising on the recumbent stepper for 20 min then walking the track for 10 min. She was slowly progressing her ability to walk on the track but needs rest breaks, METs 1.46. Will continue to work with track walking as pt tolerates. Her MEts on the recumbent stepper are 2.7, level 3.  She requires sitting rests during warm up and cool down.     Expected Outcomes Through exercise at rehab and home, the patient will decrease shortness of breath with daily activities and feel confident in carrying out an exercise regimen at home Through exercise at rehab and home, the patient will decrease shortness of breath with daily activities and feel confident in carrying out an exercise regimen at home Through exercise at rehab and home, the patient will decrease shortness of breath with daily activities and feel confident in carrying out an exercise regimen at home              Nutrition & Weight - Outcomes:  Pre Biometrics - 02/12/23 1036       Pre Biometrics   Grip Strength 21 kg              Nutrition:  Nutrition Therapy & Goals - 04/24/23 1338       Nutrition Therapy   Diet Heart Healthy Diet    Drug/Food Interactions Statins/Certain Fruits      Personal Nutrition Goals   Nutrition Goal Patient to improve diet quality by using the plate method as a guide for meal planning to include lean protein/plant protein, fruits, vegetables, whole grains, nonfat dairy as part of a well-balanced diet.   goal in action.   Personal Goal #2  Patient to identify strategies for weight loss of 0.5-2.0# per week.   goal in action.   Comments Deborah [name] has not attended pulmonary rehab since 9/10. She is awaiting further cardiac work-up (echo, 10/23) and clearance before returning to pulmonary rehab. Prior to her absences, goals in action. Deborah [name] is down >5# since starting with our program and down ~18# since starting Ozempic in May 2024 at 181#. She has made many dietary changes including reduced carbohydrate intake and decreased deep frying foods. Her A1c has improved to a normal  range at 5.4. She remains motivated to improve nutrition and lose weight. She is eating 2 meals daily and her husband is doing the majority of the cooking and she does most of the meal planning. Patient will benefit from participation in pulmonary rehab for nutrition, exercise, and lifestyle modification.      Intervention Plan   Intervention Prescribe, educate and counsel regarding individualized specific dietary modifications aiming towards targeted core components such as weight, hypertension, lipid management, diabetes, heart failure and other comorbidities.;Nutrition handout(s) given to patient.    Expected Outcomes Short Term Goal: Understand basic principles of dietary content, such as calories, fat, sodium, cholesterol and nutrients.;Long Term Goal: Adherence to prescribed nutrition plan.             Nutrition Discharge:   Education Questionnaire Score:  Knowledge Questionnaire Score - 02/12/23 1042       Knowledge Questionnaire Score   Pre Score 17/18             Deborah [name] discharged from the program 05/08/23. Due to cardiac issues she is unable to continue the program at this time. She completed 9 sessions. During discharge Deborah [name] denies any psychosocial issues or concerns. Her health is still a source of stress for her. She continues to have great support from her husband.   Core components re-evaluation: Goal met for weight loss. Deborah [name] is down  5.3# since starting the program. Deborah [name] has been working with our dietician for weight loss goals. Goal progressing on improving her shortness of breath with ADLs. Deborah [name] has maintained her oxygen saturation on 2-3L with exertion. Goal not met on developing more efficient breathing techniques such as purse lipped breathing and diaphragmatic breathing; and practicing self-pacing with activity. Deborah [name] has to be reminded to use purse lip breathing when she gets short of breath.

## 2023-05-10 ENCOUNTER — Encounter (HOSPITAL_COMMUNITY): Payer: BC Managed Care – PPO

## 2023-05-10 NOTE — Progress Notes (Signed)
Heart squeeze 55-60%, no wall motion abnormalities noted, mild stiffness noted likely related to high blood pressure and/or age, mild leakage in the mitral valve is noted.

## 2023-05-11 DIAGNOSIS — R002 Palpitations: Secondary | ICD-10-CM | POA: Diagnosis not present

## 2023-05-14 ENCOUNTER — Encounter: Payer: Self-pay | Admitting: Hematology and Oncology

## 2023-05-15 ENCOUNTER — Encounter (HOSPITAL_COMMUNITY): Payer: BC Managed Care – PPO

## 2023-05-17 ENCOUNTER — Encounter (HOSPITAL_COMMUNITY): Payer: BC Managed Care – PPO

## 2023-05-19 ENCOUNTER — Encounter: Payer: Self-pay | Admitting: Hematology and Oncology

## 2023-05-21 ENCOUNTER — Inpatient Hospital Stay: Payer: BC Managed Care – PPO

## 2023-05-21 ENCOUNTER — Inpatient Hospital Stay: Payer: BC Managed Care – PPO | Attending: Internal Medicine

## 2023-05-21 ENCOUNTER — Ambulatory Visit (HOSPITAL_COMMUNITY)
Admission: RE | Admit: 2023-05-21 | Discharge: 2023-05-21 | Disposition: A | Discharge status: Home / Self Care | Payer: BC Managed Care – PPO | Source: Ambulatory Visit | Attending: Internal Medicine | Admitting: Internal Medicine

## 2023-05-21 DIAGNOSIS — C3490 Malignant neoplasm of unspecified part of unspecified bronchus or lung: Secondary | ICD-10-CM | POA: Insufficient documentation

## 2023-05-21 DIAGNOSIS — C50412 Malignant neoplasm of upper-outer quadrant of left female breast: Secondary | ICD-10-CM | POA: Insufficient documentation

## 2023-05-21 DIAGNOSIS — Z85118 Personal history of other malignant neoplasm of bronchus and lung: Secondary | ICD-10-CM | POA: Insufficient documentation

## 2023-05-21 DIAGNOSIS — Z171 Estrogen receptor negative status [ER-]: Secondary | ICD-10-CM | POA: Diagnosis not present

## 2023-05-21 DIAGNOSIS — Z87891 Personal history of nicotine dependence: Secondary | ICD-10-CM | POA: Insufficient documentation

## 2023-05-21 DIAGNOSIS — Z79811 Long term (current) use of aromatase inhibitors: Secondary | ICD-10-CM | POA: Insufficient documentation

## 2023-05-21 DIAGNOSIS — J90 Pleural effusion, not elsewhere classified: Secondary | ICD-10-CM | POA: Diagnosis not present

## 2023-05-21 DIAGNOSIS — J479 Bronchiectasis, uncomplicated: Secondary | ICD-10-CM | POA: Diagnosis not present

## 2023-05-21 DIAGNOSIS — R918 Other nonspecific abnormal finding of lung field: Secondary | ICD-10-CM | POA: Diagnosis not present

## 2023-05-21 DIAGNOSIS — C3432 Malignant neoplasm of lower lobe, left bronchus or lung: Secondary | ICD-10-CM

## 2023-05-21 LAB — CMP (CANCER CENTER ONLY)
ALT: 9 U/L (ref 0–44)
AST: 12 U/L — ABNORMAL LOW (ref 15–41)
Albumin: 3.9 g/dL (ref 3.5–5.0)
Alkaline Phosphatase: 80 U/L (ref 38–126)
Anion gap: 5 (ref 5–15)
BUN: 14 mg/dL (ref 8–23)
CO2: 34 mmol/L — ABNORMAL HIGH (ref 22–32)
Calcium: 9.4 mg/dL (ref 8.9–10.3)
Chloride: 99 mmol/L (ref 98–111)
Creatinine: 0.63 mg/dL (ref 0.44–1.00)
GFR, Estimated: >60 mL/min (ref >60)
Glucose, Bld: 102 mg/dL — ABNORMAL HIGH (ref 70–99)
Potassium: 3.9 mmol/L (ref 3.5–5.1)
Sodium: 138 mmol/L (ref 135–145)
Total Bilirubin: 0.3 mg/dL (ref <1.2)
Total Protein: 7.9 g/dL (ref 6.5–8.1)

## 2023-05-21 LAB — CBC WITH DIFFERENTIAL (CANCER CENTER ONLY)
Abs Immature Granulocytes: 0.08 10*3/uL — ABNORMAL HIGH (ref 0.00–0.07)
Basophils Absolute: 0.1 10*3/uL (ref 0.0–0.1)
Basophils Relative: 1 %
Eosinophils Absolute: 0.1 10*3/uL (ref 0.0–0.5)
Eosinophils Relative: 1 %
HCT: 37.7 % (ref 36.0–46.0)
Hemoglobin: 12 g/dL (ref 12.0–15.0)
Immature Granulocytes: 1 %
Lymphocytes Relative: 9 %
Lymphs Abs: 1.3 10*3/uL (ref 0.7–4.0)
MCH: 25.3 pg — ABNORMAL LOW (ref 26.0–34.0)
MCHC: 31.8 g/dL (ref 30.0–36.0)
MCV: 79.5 fL — ABNORMAL LOW (ref 80.0–100.0)
Monocytes Absolute: 0.8 10*3/uL (ref 0.1–1.0)
Monocytes Relative: 6 %
Neutro Abs: 11.8 10*3/uL — ABNORMAL HIGH (ref 1.7–7.7)
Neutrophils Relative %: 82 %
Platelet Count: 326 10*3/uL (ref 150–400)
RBC: 4.74 MIL/uL (ref 3.87–5.11)
RDW: 15.7 % — ABNORMAL HIGH (ref 11.5–15.5)
WBC Count: 14.1 10*3/uL — ABNORMAL HIGH (ref 4.0–10.5)
nRBC: 0 % (ref 0.0–0.2)

## 2023-05-21 MED ORDER — HEPARIN SOD (PORK) LOCK FLUSH 100 UNIT/ML IV SOLN
500.0000 [IU] | Freq: Once | INTRAVENOUS | Status: AC
  Administered 2023-05-21: 500 [IU] via INTRAVENOUS

## 2023-05-21 MED ORDER — HEPARIN SOD (PORK) LOCK FLUSH 100 UNIT/ML IV SOLN
INTRAVENOUS | Status: AC
  Filled 2023-05-21: qty 5

## 2023-05-21 MED ORDER — IOHEXOL 300 MG/ML  SOLN
75.0000 mL | Freq: Once | INTRAMUSCULAR | Status: AC | PRN
  Administered 2023-05-21: 75 mL via INTRAVENOUS

## 2023-05-22 ENCOUNTER — Encounter (HOSPITAL_COMMUNITY): Payer: BC Managed Care – PPO

## 2023-05-23 ENCOUNTER — Inpatient Hospital Stay (HOSPITAL_BASED_OUTPATIENT_CLINIC_OR_DEPARTMENT_OTHER): Payer: BC Managed Care – PPO | Admitting: Internal Medicine

## 2023-05-23 ENCOUNTER — Ambulatory Visit: Payer: BC Managed Care – PPO | Admitting: Internal Medicine

## 2023-05-23 VITALS — BP 123/74 | HR 105 | Temp 98.0°F | Resp 16 | Ht 63.0 in | Wt 151.7 lb

## 2023-05-23 DIAGNOSIS — Z87891 Personal history of nicotine dependence: Secondary | ICD-10-CM | POA: Diagnosis not present

## 2023-05-23 DIAGNOSIS — Z79811 Long term (current) use of aromatase inhibitors: Secondary | ICD-10-CM | POA: Diagnosis not present

## 2023-05-23 DIAGNOSIS — C50412 Malignant neoplasm of upper-outer quadrant of left female breast: Secondary | ICD-10-CM | POA: Diagnosis not present

## 2023-05-23 DIAGNOSIS — Z171 Estrogen receptor negative status [ER-]: Secondary | ICD-10-CM | POA: Diagnosis not present

## 2023-05-23 DIAGNOSIS — C3490 Malignant neoplasm of unspecified part of unspecified bronchus or lung: Secondary | ICD-10-CM | POA: Diagnosis not present

## 2023-05-23 NOTE — Progress Notes (Signed)
[facility] Telephone:([phone]) [phone]   [phone]  OFFICE PROGRESS NOTE  Doreene Nest, NP [address] [place] [address]  DIAGNOSIS:  1) stage IIIB (T3, N2, M0) non-small cell lung cancer, squamous cell carcinoma presented with large left lower lobe lung mass in addition to mediastinal lymphadenopathy diagnosed in October 2019. PDL 1 expression is negative 2) recurrent breast cancer initially diagnosed as stage IIb (T2, N0, M0) left breast invasive ductal carcinoma involving the upper outer quadrant of the left breast diagnosed in August 2021.  PRIOR THERAPY:  1) Concurrent chemoradiation with weekly carboplatin for AUC of 2 and paclitaxel 45 mg/M2.  Status post 7 cycles with partial response. 2) Consolidation treatment with immunotherapy with Imfinzi 10 mg/KG every 2 weeks.  First dose July 31, 2018.  Status post 26 cycles. 3) status post left lumpectomy followed by adjuvant chemotherapy with Adriamycin, Cytoxan and Taxol  CURRENT THERAPY: Femara 2.5 mg p.o. daily under the care of Dr. Pamelia Hoit started on November 11, 2021  INTERVAL HISTORY: Deborah [name] 67 y.o. female returns to the clinic today for follow-up visit accompanied by her husband.Discussed the use of AI scribe software for clinical note transcription with the patient, who gave verbal consent to proceed.  History of Present Illness   Deborah [name], a 67 year old individual with a history of non-small cell lung cancer (stage 3B) and left breast cancer, presents for a follow-up visit. She was diagnosed with lung cancer in October 2019 and completed a year of treatment with immunotherapy (Imfinzi) in January 2021. Since then, she has been under observation for lung cancer. She also had two surgeries for left breast cancer and is currently on Femara (2.5 mg daily) prescribed by Dr. Ave Filter.  Over the past six months, she has intentionally lost a significant amount of weight, approximately  98 pounds since her initial diagnosis in 2019. This weight loss was achieved through dietary changes, including the elimination of potato chips and Oreos. She also started on Ozempic a few months ago, but attributes most of her weight loss to her dietary changes.  She continues to require home oxygen due to COPD and is under the care of a pulmonologist. She was participating in rehab, but this was halted due to concerns related to her heart. She is awaiting further investigation into this issue.  She has not reported any new symptoms or changes in her condition since her last visit. Her most recent scan showed no evidence of cancer growth or spread. However, she did mention a slight increase in her white blood cell count, which she believes could be due to lung inflammation, as she has a history of getting bronchitis around this time of year.       MEDICAL HISTORY: Past Medical History:  Diagnosis Date   Acute hypoxemic respiratory failure (HCC)    Acute on chronic respiratory failure with hypoxia (HCC) 05/24/2020   Anemia    as teen   Asthma    Breast cancer (HCC)    left breast cancer 02/2020   CHF (congestive heart failure) (HCC)    CHF, acute on chronic, unknown EF 07/25/2014   COPD exacerbation (HCC) 12/18/2015   COPD, mild (HCC)    Depression    Diabetes mellitus without complication (HCC)    Diverticulitis    Diverticulosis of colon with hemorrhage    Dyspnea 04/17/2013   Followed in Pulmonary clinic/ [facility]/ [place]  - hfa 75% p coaching  04/17/2013   - PFT's 06/05/2013 no airflow obst, dlco 60 corrects to 96%     Family history of anesthesia complication    vomiting   GI bleeding    Heart failure (HCC)    New onset 07/25/14   Histoplasmosis    left eye   Hyperkalemia    Hypertension    Lung cancer (HCC) 05/02/2018   s/p chemoradiation, immunotherapy   Obesity (BMI 30-39.9)    Pneumonia    dx wtih pneumonia on 05/27/16- seen by [facility]    PONV (postoperative  nausea and vomiting)    Restrictive lung disease    Severe sepsis (HCC)    Shortness of breath    with exertion    Sleep apnea    mask and oxygen at nite for sleep at 2L    Tobacco abuse    Umbilical hernia     ALLERGIES:  has No Known Allergies.  MEDICATIONS:  Current Outpatient Medications  Medication Sig Dispense Refill   acetaminophen (TYLENOL) 325 MG tablet Take 650 mg by mouth every 6 (six) hours as needed for headache (pain).     albuterol (VENTOLIN HFA) 108 (90 Base) MCG/ACT inhaler Inhale 2 puffs into the lungs every 6 (six) hours as needed for wheezing or shortness of breath. 8 g 3   anastrozole (ARIMIDEX) 1 MG tablet Take 1 tablet (1 mg total) by mouth daily. 90 tablet 3   aspirin 81 MG chewable tablet Chew 1 tablet (81 mg total) by mouth daily. 30 tablet 1   Blood Glucose Monitoring Suppl DEVI 1 each by Does not apply route in the morning, at noon, and at bedtime. May substitute to any manufacturer covered by patient's insurance. 1 each 0   Budeson-Glycopyrrol-Formoterol (BREZTRI AEROSPHERE) 160-9-4.8 MCG/ACT AERO Inhale 2 puffs into the lungs 2 (two) times daily. 1 each 11   Cholecalciferol (VITAMIN D3) 125 MCG (5000 UT) TABS Take 5,000 Units by mouth daily.     diltiazem (CARDIZEM CD) 240 MG 24 hr capsule TAKE 1 CAPSULE BY MOUTH EVERY DAY 90 capsule 2   furosemide (LASIX) 20 MG tablet TAKE 1 AND 1/2 TABLETS DAILY BY MOUTH 135 tablet 3   glucose blood (CONTOUR NEXT TEST) test strip 1 EACH BY IN VITRO ROUTE IN THE MORNING, AT NOON, AND AT BEDTIME. MAY SUBSTITUTE TO ANY MANUFACTURER COVERED BY PATIENT'S INSURANCE. 300 strip 1   ipratropium-albuterol (DUONEB) 0.5-2.5 (3) MG/3ML SOLN Take 3 mLs by nebulization every 4 (four) hours as needed. 360 mL 11   lidocaine-prilocaine (EMLA) cream APPLY TO AFFECTED AREA ONCE AS DIRECTED 30 g 3   montelukast (SINGULAIR) 10 MG tablet TAKE 1 TABLET (10 MG TOTAL) BY MOUTH AT BEDTIME. FOR ALLERGIES 90 tablet 2   OXYGEN Inhale 4 L/min into the  lungs continuous.     potassium chloride (KLOR-CON M10) 10 MEQ tablet TAKE 1 TABLET BY MOUTH EVERY DAY 90 tablet 2   rosuvastatin (CRESTOR) 5 MG tablet TAKE 1 TABLET BY MOUTH EVERY DAY FOR CHOLESTEROL 90 tablet 2   Semaglutide, 1 MG/DOSE, 4 MG/3ML SOPN Inject 1 mg as directed once a week. for diabetes. 9 mL 0   Spacer/Aero-Holding Chambers (AEROCHAMBER MV) inhaler Use as instructed 1 each 0   No current facility-administered medications for this visit.    SURGICAL HISTORY:  Past Surgical History:  Procedure Laterality Date   APPLICATION OF WOUND VAC  03/19/2013   Procedure: APPLICATION OF WOUND VAC;  Surgeon: Lodema Pilot, DO;  Location: [facility];  Service: General;;   BREAST BIOPSY Left 09/29/2021   BREAST BIOPSY Left 10/18/2021   BREAST LUMPECTOMY Left 04/08/2020   BREAST LUMPECTOMY Left 08/16/2022   Procedure: LEFT BREAST LUMPECTOMY;  Surgeon: Harriette Bouillon, MD;  Location: [facility] OR;  Service: General;  Laterality: Left;   BREAST LUMPECTOMY WITH RADIOACTIVE SEED AND SENTINEL LYMPH NODE BIOPSY Left 04/08/2020   Procedure: LEFT BREAST LUMPECTOMY WITH RADIOACTIVE SEED AND LEFT AXILLARY SENTINEL LYMPH NODE BIOPSY;  Surgeon: Ovidio Kin, MD;  Location: [facility] OR;  Service: General;  Laterality: Left;  PEC BLOCK, NEEDS ANESTHESIA CONSULT   CESAREAN SECTION  04/12/1985   COLONOSCOPY WITH PROPOFOL N/A 06/13/2016   Procedure: COLONOSCOPY WITH PROPOFOL;  Surgeon: Beverley Fiedler, MD;  Location: [facility];  Service: Gastroenterology;  Laterality: N/A;   ERCP N/A 02/15/2017   Procedure: ENDOSCOPIC RETROGRADE CHOLANGIOPANCREATOGRAPHY (ERCP);  Surgeon: Rachael Fee, MD;  Location: [facility];  Service: Endoscopy;  Laterality: N/A;   EYE SURGERY Left    INSERTION OF MESH N/A 03/19/2013   Procedure: INSERTION OF MESH;  Surgeon: Lodema Pilot, DO;  Location: [facility];  Service: General;  Laterality: N/A;   IR IMAGING GUIDED PORT INSERTION  03/26/2020   IR THORACENTESIS ASP PLEURAL SPACE W/IMG GUIDE   05/25/2020   VENTRAL HERNIA REPAIR N/A 03/19/2013   Procedure:  OPEN VENTRAL HERNIA REPAIR WITH MESH AND APPLICATION OF WOUND VAC;  Surgeon: Lodema Pilot, DO;  Location: [facility];  Service: General;  Laterality: N/A;   VIDEO BRONCHOSCOPY Bilateral 04/19/2018   Procedure: VIDEO BRONCHOSCOPY WITH FLUORO;  Surgeon: Oretha Milch, MD;  Location: [facility];  Service: Cardiopulmonary;  Laterality: Bilateral;    REVIEW OF SYSTEMS:  A comprehensive review of systems was negative except for: Respiratory: positive for cough and dyspnea on exertion   PHYSICAL EXAMINATION: General appearance: alert, cooperative, fatigued, and no distress Head: Normocephalic, without obvious abnormality, atraumatic Neck: no adenopathy, no JVD, supple, symmetrical, trachea midline, and thyroid not enlarged, symmetric, no tenderness/mass/nodules Lymph nodes: Cervical, supraclavicular, and axillary nodes normal. Resp: clear to auscultation bilaterally Back: symmetric, no curvature. ROM normal. No CVA tenderness. Cardio: regular rate and rhythm, S1, S2 normal, no murmur, click, rub or gallop GI: soft, non-tender; bowel sounds normal; no masses,  no organomegaly Extremities: extremities normal, atraumatic, no cyanosis or edema  ECOG PERFORMANCE STATUS: 1 - Symptomatic but completely ambulatory  Blood pressure 123/74, pulse (!) 105, temperature 98 F (36.7 C), temperature source Oral, resp. rate 16, height 5\' 3"  (1.6 m), weight 151 lb 11.2 oz (68.8 kg), SpO2 97%, peak flow (!) 2 L/min.  LABORATORY DATA: Lab Results  Component Value Date   WBC 14.1 (H) 05/21/2023   HGB 12.0 05/21/2023   HCT 37.7 05/21/2023   MCV 79.5 (L) 05/21/2023   PLT 326 05/21/2023      Chemistry      Component Value Date/Time   NA 138 05/21/2023 1146   NA 143 11/23/2016 1504   K 3.9 05/21/2023 1146   CL 99 05/21/2023 1146   CO2 34 (H) 05/21/2023 1146   BUN 14 05/21/2023 1146   BUN 14 11/23/2016 1504   CREATININE 0.63 05/21/2023 1146       Component Value Date/Time   CALCIUM 9.4 05/21/2023 1146   ALKPHOS 80 05/21/2023 1146   AST 12 (L) 05/21/2023 1146   ALT 9 05/21/2023 1146   BILITOT 0.3 05/21/2023 1146       RADIOGRAPHIC STUDIES: CT Chest W Contrast  Result Date: 05/22/2023 CLINICAL DATA:  Non-small cell lung cancer staging; * Tracking Code: BO * EXAM: CT CHEST WITH CONTRAST TECHNIQUE: Multidetector CT imaging of the chest was performed during intravenous contrast administration. RADIATION DOSE REDUCTION: This exam was performed according to the departmental dose-optimization program which includes automated exposure control, adjustment of the mA and/or kV according to patient size and/or use of iterative reconstruction technique. CONTRAST:  75mL OMNIPAQUE IOHEXOL 300 MG/ML  SOLN COMPARISON:  None Available. FINDINGS: Cardiovascular: Normal heart size. Trace pericardial effusion. Normal caliber thoracic aorta with moderate atherosclerotic disease. Severe coronary artery calcifications. Right chest wall port with tip in the right atrium. Mediastinum/Nodes: Small hiatal hernia. Thyroid is unremarkable. No enlarged lymph nodes seen in the chest. Lungs/Pleura: Central airways are patent. Bronchiectasis of the right lower lobe with numerous tree-in-bud nodules, slightly increased when compared with the prior. Interval decreased right lower lobe atelectasis. Stable left perihilar postradiation change. Stable small left pleural effusion. Upper Abdomen: Diverticulosis.  No acute abnormality. Musculoskeletal: No chest wall abnormality. No acute or significant osseous findings. IMPRESSION: 1. Stable left perihilar postradiation change. No evidence of recurrent or metastatic disease. 2. Bronchiectasis of the right lower lobe with numerous tree-in-bud nodules, slightly increased when compared with the prior, likely sequela of chronic aspiration. 3. Stable small left pleural effusion. 4. Aortic Atherosclerosis (ICD10-I70.0). Electronically  Signed   By: Allegra Lai M.D.   On: 05/22/2023 15:09   ECHOCARDIOGRAM COMPLETE  Result Date: 05/09/2023    ECHOCARDIOGRAM REPORT   Patient Name:   Deborah [name] Date of Exam: 05/09/2023 Medical Rec #:  [id number]        Height:       63.0 in Accession #:    [id number]       Weight:       153.6 lb Date of Birth:  12/17/1955       BSA:          1.728 m Patient Age:    66 years         BP:           95/68 mmHg Patient Gender: F                HR:           110 bpm. Exam Location:  [facility] Procedure: 2D Echo, Cardiac Doppler and Color Doppler Indications:    I50.30* Unspecified diastolic (congestive) heart failure  History:        Patient has prior history of Echocardiogram examinations, most                 recent 01/27/2020. CHF, COPD, Arrythmias:Tachycardia,                 Signs/Symptoms:Fatigue, Shortness of Breath and Dyspnea; Risk                 Factors:Diabetes, Former Smoker and Sleep Apnea.  Sonographer:    Ilda Mori MHA, BS, RDCS Referring Phys: [id number] SHERI HAMMOCK  Sonographer Comments: Technically challenging study due to limited acoustic windows and suboptimal apical window. Image acquisition challenging due to COPD and Image acquisition challenging due to respiratory motion. Very challenging imaging windows IMPRESSIONS  1. Left ventricular ejection fraction, by estimation, is 55 to 60%. The left ventricle has normal function. The left ventricle has no regional wall motion abnormalities. Left ventricular diastolic parameters are consistent with Grade I diastolic dysfunction (impaired relaxation).  2. Right ventricular systolic function is normal. The right ventricular size is normal. Tricuspid regurgitation signal is  inadequate for assessing PA pressure.  3. The mitral valve is normal in structure. Mild mitral valve regurgitation. No evidence of mitral stenosis.  4. The aortic valve has an indeterminant number of cusps. Aortic valve regurgitation is not visualized. No aortic  stenosis is present.  5. The inferior vena cava is normal in size with greater than 50% respiratory variability, suggesting right atrial pressure of 3 mmHg. FINDINGS  Left Ventricle: Left ventricular ejection fraction, by estimation, is 55 to 60%. The left ventricle has normal function. The left ventricle has no regional wall motion abnormalities. The left ventricular internal cavity size was normal in size. There is  no left ventricular hypertrophy. Left ventricular diastolic parameters are consistent with Grade I diastolic dysfunction (impaired relaxation). Right Ventricle: The right ventricular size is normal. No increase in right ventricular wall thickness. Right ventricular systolic function is normal. Tricuspid regurgitation signal is inadequate for assessing PA pressure. Left Atrium: Left atrial size was normal in size. Right Atrium: Right atrial size was normal in size. Pericardium: There is no evidence of pericardial effusion. Mitral Valve: The mitral valve is normal in structure. Mild mitral valve regurgitation. No evidence of mitral valve stenosis. Tricuspid Valve: The tricuspid valve is normal in structure. Tricuspid valve regurgitation is not demonstrated. No evidence of tricuspid stenosis. Aortic Valve: The aortic valve has an indeterminant number of cusps. Aortic valve regurgitation is not visualized. No aortic stenosis is present. Pulmonic Valve: The pulmonic valve was normal in structure. Pulmonic valve regurgitation is not visualized. No evidence of pulmonic stenosis. Aorta: The aortic root is normal in size and structure. Venous: The inferior vena cava is normal in size with greater than 50% respiratory variability, suggesting right atrial pressure of 3 mmHg. IAS/Shunts: No atrial level shunt detected by color flow Doppler.  LEFT VENTRICLE PLAX 2D LVIDd:         4.30 cm Diastology LVIDs:         2.90 cm LV e' medial:    7.83 cm/s LV PW:         0.90 cm LV E/e' medial:  8.8 LV IVS:        0.90 cm  LV e' lateral:   9.79 cm/s                        LV E/e' lateral: 7.1  RIGHT VENTRICLE RV S prime:     13.80 cm/s TAPSE (M-mode): 2.7 cm LEFT ATRIUM           Index LA diam:      3.40 cm 1.97 cm/m LA Vol (A4C): 32.7 ml 18.92 ml/m   AORTA Ao Root diam: 2.70 cm Ao Asc diam:  3.30 cm MITRAL VALVE MV Area (PHT): 4.89 cm MV Decel Time: 155 msec MV E velocity: 69.20 cm/s MV A velocity: 85.90 cm/s MV E/A ratio:  0.81 Julien Nordmann MD Electronically signed by Julien Nordmann MD Signature Date/Time: 05/09/2023/4:14:47 PM    Final      ASSESSMENT AND PLAN: This is a very pleasant 67 years old white female with stage IIIB non-small cell lung cancer, squamous cell carcinoma. She underwent a course of concurrent chemoradiation with weekly carboplatin and paclitaxel, status post 7 cycles.  She tolerated this treatment well with no concerning adverse effect except for fatigue. She underwent consolidation treatment with immunotherapy with Imfinzi status post 26 cycles. The patient was also diagnosed with a stage IIb left breast cancer s/p lumpectomy followed by adjuvant systemic chemotherapy  with Adriamycin, Cytoxan and Taxol.  She was followed by Dr. Pamelia Hoit at that time.  She was recently found to have recurrence of her disease and she is started on treatment with Femara 2.5 mg p.o. daily by Dr. Pamelia Hoit on 11/11/2021. The patient is currently on observation.  She had repeat CT scan of the chest performed recently.  I personally and independently reviewed the scan and discussed the result with the patient and her husband. Her scan showed no concerning findings for disease progression.  Non-small cell lung cancer Diagnosed stage 3B in October 2019, completed chemo, radiation, and one year of immunotherapy with Imfinzi in January 2021. Currently on observation. Recent scan shows no evidence of cancer growth or spread. -Continue observation. -Plan for labs and scan in 6 months prior to next visit.  Breast  cancer Diagnosed twice, currently on Femara 2.5mg  daily by Dr. Pamelia Hoit. -Continue Femara as prescribed by Dr. Pamelia Hoit.  Chronic Obstructive Pulmonary Disease (COPD) On home oxygen, managed by pulmonologist. Recent cessation of rehab due to cardiac concerns. -Continue management with pulmonologist.  Weight loss Significant weight loss since 2019, recently started on Ozempic. -Continue current weight management strategies.  Elevated white blood cell count Possible inflammation due to COPD exacerbation. -Monitor for signs of infection or inflammation.   The patient was advised to call immediately if she has any concerning symptoms in the interval. The patient voices understanding of current disease status and treatment options and is in agreement with the current care plan.  All questions were answered. The patient knows to call the clinic with any problems, questions or concerns. We can certainly see the patient much sooner if necessary.  Disclaimer: This note was dictated with voice recognition software. Similar sounding words can inadvertently be transcribed and may not be corrected upon review.

## 2023-05-24 ENCOUNTER — Encounter (HOSPITAL_COMMUNITY): Payer: BC Managed Care – PPO

## 2023-05-29 ENCOUNTER — Encounter (HOSPITAL_COMMUNITY): Payer: BC Managed Care – PPO

## 2023-05-31 ENCOUNTER — Encounter (HOSPITAL_COMMUNITY): Payer: BC Managed Care – PPO

## 2023-06-01 ENCOUNTER — Encounter: Payer: Self-pay | Admitting: Cardiology

## 2023-06-01 ENCOUNTER — Ambulatory Visit: Payer: BC Managed Care – PPO | Attending: Cardiology | Admitting: Cardiology

## 2023-06-01 VITALS — BP 102/74 | HR 105 | Ht 63.0 in | Wt 148.6 lb

## 2023-06-01 DIAGNOSIS — I10 Essential (primary) hypertension: Secondary | ICD-10-CM

## 2023-06-01 DIAGNOSIS — J449 Chronic obstructive pulmonary disease, unspecified: Secondary | ICD-10-CM

## 2023-06-01 DIAGNOSIS — Z171 Estrogen receptor negative status [ER-]: Secondary | ICD-10-CM

## 2023-06-01 DIAGNOSIS — J9611 Chronic respiratory failure with hypoxia: Secondary | ICD-10-CM

## 2023-06-01 DIAGNOSIS — C50412 Malignant neoplasm of upper-outer quadrant of left female breast: Secondary | ICD-10-CM

## 2023-06-01 DIAGNOSIS — I5032 Chronic diastolic (congestive) heart failure: Secondary | ICD-10-CM | POA: Diagnosis not present

## 2023-06-01 DIAGNOSIS — R000 Tachycardia, unspecified: Secondary | ICD-10-CM

## 2023-06-01 DIAGNOSIS — R0789 Other chest pain: Secondary | ICD-10-CM

## 2023-06-01 DIAGNOSIS — E782 Mixed hyperlipidemia: Secondary | ICD-10-CM

## 2023-06-01 NOTE — Progress Notes (Signed)
Cardiology Office Note:  .   Date:  06/01/2023  ID:  Deborah [name], DOB 10/24/1955, MRN [id number] PCP: Doreene Nest, NP   HeartCare Providers Cardiologist:  Lorine Bears, MD    History of Present Illness: Marland Kitchen   Deborah [name] is a 67 y.o. female with a history of chronic diastolic congestive heart failure, chronic right heart failure COPD, oxygen dependence, previous tobacco use, hypertension, obesity, bipolar disorder, history of breast cancer, pulm hypertension, obstructive sleep apnea, type 2 diabetes, tobacco abuse disorder, morbid obesity, who is here today for follow-up.   She previously been admitted 07/2014 for CHF.  Moderate left pleural effusion.  Echocardiogram revealed EF 50-25%, G1 DD, dilated RV with hypokinesis and mild pulm hypertension.  VQ scan showed low probability of PE.  She subsequently quit smoking.  She was diagnosed with squamous cell carcinoma of the lung stage IIIb 07/2017.  She had a large left lower lobe mass in addition to mediastinal lymphadenopathy.  She was treated with radiation, chemotherapy, and immunotherapy.  Subsequent echocardiogram revealed an EF 55 to 60%, no RWMA, RVSP 33 mmHg.  Heart monitor 2022 showed predominantly normal sinus rhythm with average heart rates in the 90s, prescribed block, second-degree AV block with brief SVTs and rare PVCs.  10/2018 she was doing well from a cardiac perspective and reported recurrent breast cancer.  Heart monitor in April 2023 revealed an average heart rate of 91 bpm, underlying was sinus with first-degree AV block, 5 supraventricular tachycardia runs winky block was present and isolated rare PVCs.  Echocardiogram was repeated and revealed LVEF 55 to 60%, no RWMA, G1 DD, mild MR.  Event monitor was also placed.   She was last seen in clinic 04/16/2023.  She is accompanied by her husband.  She continues to have chronic shortness of breath is maintained on chronic oxygen therapy.  She states that she was at  pulmonary rehab and was walking and her heart rate shot up to 160 and they completed an EKG and told her that she needed to follow-up with cardiology that she was likely in atrial fibrillation or atrial flutter.  She was concerned and she was advised that she was in A-fib or flutter she would need to go on anticoagulant medication or if she would have a stroke.  She was scheduled for follow-up echocardiogram and ZIO XT monitor for 2 weeks to rule out atrial fibrillation or atrial flutter.  She returns to clinic today accompanied by her husband.  She states that she has been having a atypical chest discomfort on the left side of her chest that is a sharp stabbing reproducible type pain with movement.  She states that she is concerned that it may have happened when she was pushed herself up from her wheelchair.  She has chronic shortness of breath and is maintained on oxygen via nasal cannula.  She does have cough that is productive on occasion.  States that she had recently had chest CT which showed small left pleural effusion and stable left perihilar postradiation changes.  She also has bronchiectasis of the right lower lobe and numerous tree-in-bud nodules.  She states she has been compliant with her current medication regimen.  She has not had any further issues in rehab.  She denies any hospitalizations or visits to the emergency department  ROS: Point review of systems has been reviewed and considered negative with exception was been listed in HPI  Studies Reviewed: Marland Kitchen   EKG Interpretation Date/Time:  Friday June 01 2023 13:28:34 EST Ventricular Rate:  105 PR Interval:  172 QRS Duration:  82 QT Interval:  346 QTC Calculation: 457 R Axis:   76  Text Interpretation: Sinus tachycardia Low voltage QRS When compared with ECG of 16-Apr-2023 10:47, Confirmed by Charlsie Quest ([id number]) on 06/01/2023 1:31:30 PM    Event Monitor 05/14/23 Patient had a min HR of 56 bpm, max HR of 171 bpm, and avg HR  of 93 bpm. Predominant underlying rhythm was Sinus Rhythm. First Degree AV Block was present. 1 run of Supraventricular Tachycardia occurred lasting 15 beats with a max rate of 171 bpm (avg  153 bpm). Episode of Supraventricular Tachycardia may be possible Atrial Tachycardia with variable block. Isolated SVEs were rare (<1.0%), SVE Couplets were rare (<1.0%), and no SVE Triplets were present. Isolated VEs were rare (<1.0%), VE Couplets were  rare (<1.0%), and no VE Triplets were present.  2D echo 05/09/23 1. Left ventricular ejection fraction, by estimation, is 55 to 60%. The  left ventricle has normal function. The left ventricle has no regional  wall motion abnormalities. Left ventricular diastolic parameters are  consistent with Grade I diastolic  dysfunction (impaired relaxation).   2. Right ventricular systolic function is normal. The right ventricular  size is normal. Tricuspid regurgitation signal is inadequate for assessing  PA pressure.   3. The mitral valve is normal in structure. Mild mitral valve  regurgitation. No evidence of mitral stenosis.   4. The aortic valve has an indeterminant number of cusps. Aortic valve  regurgitation is not visualized. No aortic stenosis is present.   5. The inferior vena cava is normal in size with greater than 50%  respiratory variability, suggesting right atrial pressure of 3 mmHg.   Heart monitor 10/2021 Patient had a min HR of 53 bpm, max HR of 197 bpm, and avg HR of 91 bpm.  Predominant underlying rhythm was Sinus Rhythm. First Degree AV Block was present.  5 Supraventricular Tachycardia runs occurred, the run with the fastest interval lasting 8 beats with a max rate of 197 bpm, the longest lasting 16 beats with an avg rate of 121 bpm. Second Degree AV Block-Mobitz I (Wenckebach) was present. Isolated Rare PVCs.   Echo 05/25/20  1. Left ventricular ejection fraction, by estimation, is 55 to 60%. The  left ventricle has normal function. The  left ventricle has no regional  wall motion abnormalities. Indeterminate diastolic filling due to E-A  fusion.   2. Right ventricular systolic function is normal. The right ventricular  size is normal. There is normal pulmonary artery systolic pressure. The  estimated right ventricular systolic pressure is 35.0 mmHg.   3. The mitral valve is normal in structure. No evidence of mitral valve  regurgitation. No evidence of mitral stenosis.   4. The aortic valve is normal in structure. Aortic valve regurgitation is  not visualized. No aortic stenosis is present.   5. The inferior vena cava is normal in size with greater than 50%  respiratory variability, suggesting right atrial pressure of 3 mmHg.  Comparison(s): No significant change from prior study. Prior images  reviewed side by side.  Risk Assessment/Calculations:             Physical Exam:   VS:  BP 102/74 (BP Location: Left Arm, Patient Position: Sitting, Cuff Size: Normal)   Pulse (!) 105   Ht 5\' 3"  (1.6 m)   Wt 148 lb 9.6 oz (67.4 kg)   SpO2 96%  BMI 26.32 kg/m    Wt Readings from Last 3 Encounters:  06/01/23 148 lb 9.6 oz (67.4 kg)  05/23/23 151 lb 11.2 oz (68.8 kg)  04/16/23 153 lb 9.6 oz (69.7 kg)    GEN: Well nourished, well developed in no acute distress NECK: No JVD; No carotid bruits CARDIAC: RRR, no murmurs, rubs, gallops RESPIRATORY:  Clear with diminished bases and forced expiratory wheezes, respirations are unlabored at rest on 2 L of O2 via nasal cannula ABDOMEN: Soft, non-tender, non-distended EXTREMITIES:  No edema; No deformity   ASSESSMENT AND PLAN: .   Atypical chest discomfort that is a sharp stabbing pain when she is coughing, laughing, that is not reproducible.  She states that she noticed it when she was pushing herself up out of her wheelchair.  EKG today reveals sinus tachycardia with a rate of 105 with no acute changes from prior studies.  Recent echocardiogram revealed no wall motion abnormalities  and LVEF 55 to 60% with mild MR.  She has been sent for a troponin T and D-dimer today.  Chronic HFpEF with last echocardiogram revealed an LVEF of 55 to 60%, no RWMA, mild MR.  She is euvolemic on exam.  She is continued on furosemide 30 mg daily.  She denies any shortness of breath.  She has not been placed chronic respiratory failure and COPD.  She is also not ideal candidate for SGLT2 inhibitor due to recurrent wheelchair use and sedentary lifestyle.  Sinus tachycardia noted on EKG today with a rate of 105.  ZIO XT monitor showed an average heart rate of 93 bpm and 1 episode of supraventricular tachycardia.  She has been continued on Cardizem 240 mg.  Chronic respiratory failure and COPD on 2 to 4 L of oxygen.  Continues to be stable and managed by pulmonary.  Hyperlipidemia with a last LDL 45.  She is continued on rosuvastatin 5 mg daily.  Hypertension with blood pressure 102/74.  Blood pressures slightly soft today.  She is continued on her diltiazem.  She has been encouraged to continue to monitor her blood pressure 1 to 2 hours postmedication administration as well.  The left breast cancer which she previously been treated with radiation and chemotherapy and immunotherapy.  She went underwent an additional lumpectomy.  She is not considered a candidate for repeat chemotherapy nor any additional therapies due to her poor overall health.  Lymph node mapping and/or dissection was deferred by the patient's request due to her overall poor health.  Pros and cons were discussed.  She continues to be managed by oncology.       Dispo: Patient to return to clinic in 3 months or sooner to see MD/APP for reevaluation of symptoms.  Signed, Faithlyn Recktenwald, NP

## 2023-06-01 NOTE — Patient Instructions (Signed)
Medication Instructions:  Your physician recommends that you continue on your current medications as directed. Please refer to the Current Medication list given to you today.  *If you need a refill on your cardiac medications before your next appointment, please call your pharmacy*  Lab Work: Your provider would like for you to have following labs drawn today Troponin T & D-dimer, quantitative.   If you have labs (blood work) drawn today and your tests are completely normal, you will receive your results only by: MyChart Message (if you have MyChart) OR A paper copy in the mail If you have any lab test that is abnormal or we need to change your treatment, we will call you to review the results.  Testing/Procedures: - None  Follow-Up: At [facility], you and your health needs are our priority.  As part of our continuing mission to provide you with exceptional heart care, we have created designated Provider Care Teams.  These Care Teams include your primary Cardiologist (physician) and Advanced Practice Providers (APPs -  Physician Assistants and Nurse Practitioners) who all work together to provide you with the care you need, when you need it.  Your next appointment:   3 month(s)  Provider:   Charlsie Quest, NP    Other Instructions - None

## 2023-06-02 LAB — D-DIMER, QUANTITATIVE: D-DIMER: 0.86 mg{FEU}/L — ABNORMAL HIGH (ref 0.00–0.49)

## 2023-06-02 LAB — TROPONIN T: Troponin T (Highly Sensitive): 6 ng/L (ref 0–14)

## 2023-06-04 NOTE — Progress Notes (Signed)
Elevated D-dimer. Recommend CT of the chest PE protocol to rule out blood clot to the lung. Negative troponin level, no concerns of heart attack.

## 2023-06-05 ENCOUNTER — Encounter (HOSPITAL_COMMUNITY): Payer: BC Managed Care – PPO

## 2023-06-06 ENCOUNTER — Encounter: Payer: Self-pay | Admitting: Hematology and Oncology

## 2023-06-06 NOTE — Telephone Encounter (Signed)
Telephone call  

## 2023-06-07 ENCOUNTER — Encounter (HOSPITAL_COMMUNITY): Payer: BC Managed Care – PPO

## 2023-06-07 ENCOUNTER — Other Ambulatory Visit: Payer: Self-pay

## 2023-06-07 DIAGNOSIS — I272 Pulmonary hypertension, unspecified: Secondary | ICD-10-CM

## 2023-06-07 DIAGNOSIS — I5032 Chronic diastolic (congestive) heart failure: Secondary | ICD-10-CM

## 2023-06-07 DIAGNOSIS — J9611 Chronic respiratory failure with hypoxia: Secondary | ICD-10-CM

## 2023-06-12 ENCOUNTER — Encounter (HOSPITAL_COMMUNITY): Payer: BC Managed Care – PPO

## 2023-06-12 NOTE — Progress Notes (Signed)
Average heart rate of 93 beats per minute, normal sinus rhythm with 1 fast heart rate run lasting for 15 beats with a heart rate at 171 beats per minute, pare early beats noted, no pauses, no atrial fibrillation/ flutter noted. Discussed results at last appointment.

## 2023-06-15 ENCOUNTER — Ambulatory Visit (HOSPITAL_COMMUNITY)
Admission: RE | Admit: 2023-06-15 | Discharge: 2023-06-15 | Disposition: A | Discharge status: Home / Self Care | Payer: BC Managed Care – PPO | Source: Ambulatory Visit | Attending: Cardiology | Admitting: Cardiology

## 2023-06-15 DIAGNOSIS — E869 Volume depletion, unspecified: Secondary | ICD-10-CM | POA: Diagnosis not present

## 2023-06-15 DIAGNOSIS — J9611 Chronic respiratory failure with hypoxia: Secondary | ICD-10-CM | POA: Insufficient documentation

## 2023-06-15 DIAGNOSIS — I272 Pulmonary hypertension, unspecified: Secondary | ICD-10-CM | POA: Insufficient documentation

## 2023-06-15 DIAGNOSIS — J90 Pleural effusion, not elsewhere classified: Secondary | ICD-10-CM | POA: Diagnosis not present

## 2023-06-15 DIAGNOSIS — R935 Abnormal findings on diagnostic imaging of other abdominal regions, including retroperitoneum: Secondary | ICD-10-CM | POA: Diagnosis not present

## 2023-06-15 DIAGNOSIS — I5032 Chronic diastolic (congestive) heart failure: Secondary | ICD-10-CM | POA: Insufficient documentation

## 2023-06-15 DIAGNOSIS — J432 Centrilobular emphysema: Secondary | ICD-10-CM | POA: Diagnosis not present

## 2023-06-15 MED ORDER — HEPARIN SOD (PORK) LOCK FLUSH 100 UNIT/ML IV SOLN
INTRAVENOUS | Status: AC
  Filled 2023-06-15: qty 5

## 2023-06-15 MED ORDER — IOHEXOL 350 MG/ML SOLN
75.0000 mL | Freq: Once | INTRAVENOUS | Status: AC | PRN
  Administered 2023-06-15: 75 mL via INTRAVENOUS

## 2023-06-15 MED ORDER — HEPARIN SOD (PORK) LOCK FLUSH 100 UNIT/ML IV SOLN
500.0000 [IU] | Freq: Once | INTRAVENOUS | Status: AC
  Administered 2023-06-15: 500 [IU] via INTRAVENOUS

## 2023-06-18 NOTE — Progress Notes (Signed)
No evidence of a blood clot in the lung and no other changes to cause symptoms.

## 2023-06-19 ENCOUNTER — Encounter (HOSPITAL_COMMUNITY): Payer: BC Managed Care – PPO

## 2023-06-20 ENCOUNTER — Other Ambulatory Visit: Payer: Self-pay | Admitting: Primary Care

## 2023-06-20 DIAGNOSIS — E1165 Type 2 diabetes mellitus with hyperglycemia: Secondary | ICD-10-CM

## 2023-06-21 ENCOUNTER — Encounter (HOSPITAL_COMMUNITY): Payer: BC Managed Care – PPO

## 2023-06-21 ENCOUNTER — Telehealth: Payer: Self-pay | Admitting: Pulmonary Disease

## 2023-06-21 MED ORDER — DOXYCYCLINE HYCLATE 100 MG PO TABS: 100.0000 mg | ORAL_TABLET | Freq: Two times a day (BID) | ORAL | 0 refills | Status: DC

## 2023-06-21 NOTE — Telephone Encounter (Signed)
Patient states having chest pains, cough and mucus. No available appointments at this time. Pharmacy is [facility] [address] Patient phone number is [phone].

## 2023-06-21 NOTE — Telephone Encounter (Signed)
I see she is being followed by Oncology for a lung cancer, and has a recall pending with Dr Judeth Horn by Dec 25.   Suggest we send doxycycline 100 mg, # 145, 1 twice daily. She can try a heating pad and ibuprofen or tylenol for the sharp chest pain. Please see if Dr Judeth Horn or an APP can see her sooner than Dec 25.

## 2023-06-21 NOTE — Telephone Encounter (Signed)
Spoke with the pt She is c/o cough since mid Oct 2024- prod with large amounts "medium green" She has some sharp CP on the left that occurs after exertion- started about a month ago  She is wheezing and has had slight increase in SOB  She had a "slight fever"- 101 2 days ago- taking advil daily   Please advise, thanks!  No Known Allergies

## 2023-06-21 NOTE — Telephone Encounter (Signed)
I called and spoke with the Deborah [name] and notified of response per Dr Maple Hudson  She verbalized understanding  Rxs sent to pharm for doxy   Dr Judeth Horn- you have a held slot on 07/05/23 at 11 am- could we see her then?

## 2023-06-25 NOTE — Telephone Encounter (Signed)
Ok with me - why is it held? I have no holds requested to my knowledge. Is it held for acute or hospital follow up?

## 2023-06-25 NOTE — Telephone Encounter (Signed)
Spoke with the pt and have scheduled for appt  Nothing further needed

## 2023-06-26 ENCOUNTER — Encounter (HOSPITAL_COMMUNITY): Payer: BC Managed Care – PPO

## 2023-06-28 ENCOUNTER — Encounter (HOSPITAL_COMMUNITY): Payer: BC Managed Care – PPO

## 2023-07-03 ENCOUNTER — Encounter (HOSPITAL_COMMUNITY): Payer: BC Managed Care – PPO

## 2023-07-05 ENCOUNTER — Ambulatory Visit: Payer: BC Managed Care – PPO | Admitting: Pulmonary Disease

## 2023-07-05 ENCOUNTER — Encounter (HOSPITAL_COMMUNITY): Payer: BC Managed Care – PPO

## 2023-07-05 ENCOUNTER — Encounter: Payer: Self-pay | Admitting: Pulmonary Disease

## 2023-07-05 VITALS — BP 99/68 | HR 103 | Temp 98.1°F | Ht 63.0 in | Wt 145.0 lb

## 2023-07-05 DIAGNOSIS — J9611 Chronic respiratory failure with hypoxia: Secondary | ICD-10-CM | POA: Diagnosis not present

## 2023-07-05 DIAGNOSIS — J432 Centrilobular emphysema: Secondary | ICD-10-CM

## 2023-07-05 DIAGNOSIS — J449 Chronic obstructive pulmonary disease, unspecified: Secondary | ICD-10-CM

## 2023-07-05 MED ORDER — PREDNISONE 20 MG PO TABS: ORAL_TABLET | ORAL | 0 refills | Status: AC

## 2023-07-05 NOTE — Patient Instructions (Signed)
Nice to see you  I am glad you are feeling better with the antibiotic  Take prednisone 40 mg for 5 days and 20 mg for 5 days then stop to help with ongoing cough and congestion  Continue all your other inhalers and nebulizers as you are  If your wheezing and congestion has improved after Christmas then I think it is okay to get back on your exercise machine and try to exercise again.  Return to clinic in 3 months or sooner as needed with Dr. Judeth Horn

## 2023-07-05 NOTE — Progress Notes (Signed)
@Patient  ID: Deborah [name], female    DOB: 1955/12/02, 67 y.o.   MRN: [id number]  Chief Complaint  Patient presents with   Acute Visit    Coughing with green mucus going on since oct. She follow up with cardiology . Taking ibuprofen  And Tylenol cold and flu , some fever about 2 weeks      Referring provider: Doreene Nest, NP  HPI:    07/05/2023  - Visit  Returns after recent illness.  Chest pain fever.  Was being evaluated by the cardiologist in the preceding weeks with worsening dyspnea.  After they could not find thing wrong with chest pain and fever contacted our office.  Doctor on-call prescribed doxycycline.  This greatly improved her chest pain, fever went away.  Breathing has improved as well.  She reported more frequent DuoNeb use.  Over the last week her breathing has improved.  She completed her antibiotics about 1 week ago.  However she is a lot of congestion.  Thick green mucus she is coughing up.  Wheezing a lot.  Questionaires / Pulmonary Flowsheets:   ACT:      No data to display          MMRC: mMRC Dyspnea Scale mMRC Score  02/23/2020  5:13 PM 3    Epworth:      No data to display          Tests:   FENO:  No results found for: "NITRICOXIDE"  PFT:    Latest Ref Rng & Units 02/18/2020    4:24 PM 01/01/2017    8:40 AM 06/05/2013    9:00 AM  PFT Results  FVC-Pre L 1.29  1.76    FVC-Predicted Pre % 40  54  54      FVC-Post L 1.33  1.88  1.96      FVC-Predicted Post % 42  58  59      Pre FEV1/FVC % % 74  75  82      Post FEV1/FCV % % 75  80  81      FEV1-Pre L 0.95  1.32  1.47      FEV1-Predicted Pre % 38  52  56      FEV1-Post L 1.00  1.50  1.58      DLCO uncorrected ml/min/mmHg 11.41  14.89  13.94      DLCO UNC% % 58  64  60      DLCO corrected ml/min/mmHg 11.41  14.98    DLCO COR %Predicted % 58  65    DLVA Predicted % 108  92  96      TLC L  4.16  4.13      TLC % Predicted %  84  84      RV % Predicted %  118  111          This result is from an external source.  Personally reviewed and interpreted as severe fixed obstruction, DLCO moderately reduced 02/2020 similar findings 12/2016 on my review and interpretation with addition of lung volumes demonstrating normal TLC, borderline but no evidence of air trapping, subsequent worsening FEV1 2021 to compare to 2023.  WALK:     02/12/2023   10:21 AM 04/20/2016    4:38 PM 03/24/2015    5:11 PM 08/26/2014   11:03 AM 04/17/2013    9:21 AM  SIX MIN WALK  Supplimental Oxygen during Test? (L/min)   No Yes No  O2 Flow  Rate    2 L/min   Type    Continuous   2 Minute Oxygen Saturation % 96 % 93 %     2 Minute HR 138 125     4 Minute Oxygen Saturation % 96 % 93 %     4 Minute HR 153 132     6 Minute Oxygen Saturation % 97 % 92 %     6 Minute HR 145 154     Tech Comments:   stopped after 2nd lap due to fatigue, sob Pt had c/o sob after completion of 1st lap. Pt was able to complete 3 laps on 2 L 02.     Imaging: CT Angio Chest Pulmonary Embolism (PE) W or WO Contrast Result Date: 06/15/2023 CLINICAL DATA:  Chronic respiratory failure with hypoxia. Elevated D-dimer levels. Clinical concern for acute pulmonary embolism. EXAM: CT ANGIOGRAPHY CHEST WITH CONTRAST TECHNIQUE: Multidetector CT imaging of the chest was performed using the standard protocol during bolus administration of intravenous contrast. Multiplanar CT image reconstructions and MIPs were obtained to evaluate the vascular anatomy. RADIATION DOSE REDUCTION: This exam was performed according to the departmental dose-optimization program which includes automated exposure control, adjustment of the mA and/or kV according to patient size and/or use of iterative reconstruction technique. CONTRAST:  75mL OMNIPAQUE IOHEXOL 350 MG/ML SOLN COMPARISON:  Chest CT 05/21/2023 and 11/17/2022 FINDINGS: Cardiovascular: The pulmonary arteries are well opacified with contrast to the level of the segmental branches. There is no evidence of  acute pulmonary embolism. Diffuse atherosclerosis of the aorta, great vessels and coronary arteries again noted. Right IJ Port-A-Cath extends to the mid right atrium, near the tricuspid valve. Stable trace pericardial fluid versus thickening. The heart size is stable. Mediastinum/Nodes: There are no enlarged mediastinal, hilar or axillary lymph nodes.Stable small mediastinal and hilar lymph nodes, some calcified. The thyroid gland, trachea and esophagus demonstrate no significant findings. Lungs/Pleura: Moderate centrilobular emphysema with diffuse central airway thickening and scattered areas of mucoid impaction. Chronic radiation changes posteromedially in the left hemithorax with volume loss and fibrosis. Scattered calcified granulomas. No airspace disease or suspicious pulmonary nodularity. Stable small left pleural effusion. No pneumothorax. Upper abdomen: The visualized upper abdomen appears stable, without suspicious findings. There are scattered calcified granulomas within the liver and spleen. Musculoskeletal/Chest wall: There is no chest wall mass or suspicious osseous finding. Review of the MIP images confirms the above findings. IMPRESSION: 1. No evidence of acute pulmonary embolism or other acute chest findings. 2. Stable chronic radiation changes posteromedially in the left hemithorax and small left pleural effusion. 3. Aortic Atherosclerosis (ICD10-I70.0) and Emphysema (ICD10-J43.9). Electronically Signed   By: Carey Bullocks M.D.   On: 06/15/2023 08:28    Lab Results: Personally reviewed CBC    Component Value Date/Time   WBC 14.1 (H) 05/21/2023 1146   WBC 11.7 (H) 08/08/2022 1546   RBC 4.74 05/21/2023 1146   HGB 12.0 05/21/2023 1146   HCT 37.7 05/21/2023 1146   PLT 326 05/21/2023 1146   MCV 79.5 (L) 05/21/2023 1146   MCH 25.3 (L) 05/21/2023 1146   MCHC 31.8 05/21/2023 1146   RDW 15.7 (H) 05/21/2023 1146   LYMPHSABS 1.3 05/21/2023 1146   MONOABS 0.8 05/21/2023 1146   EOSABS 0.1  05/21/2023 1146   BASOSABS 0.1 05/21/2023 1146    BMET    Component Value Date/Time   NA 138 05/21/2023 1146   NA 143 11/23/2016 1504   K 3.9 05/21/2023 1146   CL 99 05/21/2023 1146  CO2 34 (H) 05/21/2023 1146   GLUCOSE 102 (H) 05/21/2023 1146   BUN 14 05/21/2023 1146   BUN 14 11/23/2016 1504   CREATININE 0.63 05/21/2023 1146   CALCIUM 9.4 05/21/2023 1146   GFRNONAA >60 05/21/2023 1146   GFRAA >60 04/02/2020 1400   GFRAA >60 01/26/2020 1019    BNP    Component Value Date/Time   BNP 280.0 (H) 05/26/2020 0505    ProBNP No results found for: "PROBNP"  Specialty Problems       Pulmonary Problems   Chronic respiratory failure (HCC)   Followed in Pulmonary clinic/ [facility]/ [facility] - 04/17/2013  Walked RA x 1 laps @ 185 ft each stopped due to  desat 86% rapid pace  From resting sat 94%      CRLD (chronic restrictive lung disease) secondary to super morbid obesity with mild COPD component   OSA (obstructive sleep apnea)   Auto CPAP 5-10  2 L oxygen blended      COPD (chronic obstructive pulmonary disease) (HCC)   Primary malignant neoplasm of bronchus of left lower lobe (HCC)    No Known Allergies  Immunization History  Administered Date(s) Administered   Influenza Inj Mdck Quad Pf 04/22/2022   Influenza,inj,Quad PF,6+ Mos 04/09/2013, 07/27/2014, 04/18/2016, 05/28/2017, 04/03/2018, 04/08/2019, 04/22/2021   Moderna Covid-19 Fall Seasonal Vaccine 75yrs & older 05/14/2023   PFIZER(Purple Top)SARS-COV-2 Vaccination 09/28/2019, 10/18/2019   PNEUMOCOCCAL CONJUGATE-20 02/28/2023   Pfizer Covid-19 Vaccine Bivalent Booster 2yrs & up 04/22/2021   Pneumococcal Conjugate-13 01/01/2017   Pneumococcal Polysaccharide-23 07/27/2014   Tdap 11/12/2017   Zoster Recombinant(Shingrix) 11/12/2019, 01/14/2020    Past Medical History:  Diagnosis Date   Acute hypoxemic respiratory failure (HCC)    Acute on chronic respiratory failure with hypoxia (HCC) 05/24/2020    Anemia    as teen   Asthma    Breast cancer (HCC)    left breast cancer 02/2020   CHF (congestive heart failure) (HCC)    CHF, acute on chronic, unknown EF 07/25/2014   COPD exacerbation (HCC) 12/18/2015   COPD, mild (HCC)    Depression    Diabetes mellitus without complication (HCC)    Diverticulitis    Diverticulosis of colon with hemorrhage    Dyspnea 04/17/2013   Followed in Pulmonary clinic/ [facility]/ [facility]  - hfa 75% p coaching 04/17/2013   - PFT's 06/05/2013 no airflow obst, dlco 60 corrects to 96%     Family history of anesthesia complication    vomiting   GI bleeding    Heart failure (HCC)    New onset 07/25/14   Histoplasmosis    left eye   Hyperkalemia    Hypertension    Lung cancer (HCC) 05/02/2018   s/p chemoradiation, immunotherapy   Obesity (BMI 30-39.9)    Pneumonia    dx wtih pneumonia on 05/27/16- seen by [facility]    PONV (postoperative nausea and vomiting)    Restrictive lung disease    Severe sepsis (HCC)    Shortness of breath    with exertion    Sleep apnea    mask and oxygen at nite for sleep at 2L    Tobacco abuse    Umbilical hernia     Tobacco History: Social History   Tobacco Use  Smoking Status Former   Current packs/day: 0.00   Average packs/day: 3.0 packs/day for 43.0 years (129.0 ttl pk-yrs)   Types: Cigarettes   Start date: 12/17/1969   Quit date: 12/17/2012   Years  since quitting: 10.5  Smokeless Tobacco Never   Counseling given: Not Answered   Continue to not smoke  Outpatient Encounter Medications as of 07/05/2023  Medication Sig   acetaminophen (TYLENOL) 325 MG tablet Take 650 mg by mouth every 6 (six) hours as needed for headache (pain).   albuterol (VENTOLIN HFA) 108 (90 Base) MCG/ACT inhaler Inhale 2 puffs into the lungs every 6 (six) hours as needed for wheezing or shortness of breath.   anastrozole (ARIMIDEX) 1 MG tablet Take 1 tablet (1 mg total) by mouth daily.   aspirin 81 MG chewable tablet Chew 1 tablet (81  mg total) by mouth daily.   Blood Glucose Monitoring Suppl DEVI 1 each by Does not apply route in the morning, at noon, and at bedtime. May substitute to any manufacturer covered by patient's insurance.   Budeson-Glycopyrrol-Formoterol (BREZTRI AEROSPHERE) 160-9-4.8 MCG/ACT AERO Inhale 2 puffs into the lungs 2 (two) times daily.   Cholecalciferol (VITAMIN D3) 125 MCG (5000 UT) TABS Take 5,000 Units by mouth daily.   diltiazem (CARDIZEM CD) 240 MG 24 hr capsule TAKE 1 CAPSULE BY MOUTH EVERY DAY   doxycycline (VIBRA-TABS) 100 MG tablet Take 1 tablet (100 mg total) by mouth 2 (two) times daily.   furosemide (LASIX) 20 MG tablet TAKE 1 AND 1/2 TABLETS DAILY BY MOUTH   glucose blood (CONTOUR NEXT TEST) test strip 1 EACH BY IN VITRO ROUTE IN THE MORNING, AT NOON, AND AT BEDTIME. MAY SUBSTITUTE TO ANY MANUFACTURER COVERED BY PATIENT'S INSURANCE.   ipratropium-albuterol (DUONEB) 0.5-2.5 (3) MG/3ML SOLN Take 3 mLs by nebulization every 4 (four) hours as needed.   lidocaine-prilocaine (EMLA) cream APPLY TO AFFECTED AREA ONCE AS DIRECTED   montelukast (SINGULAIR) 10 MG tablet TAKE 1 TABLET (10 MG TOTAL) BY MOUTH AT BEDTIME. FOR ALLERGIES   OXYGEN Inhale 4 L/min into the lungs continuous.   potassium chloride (KLOR-CON M10) 10 MEQ tablet TAKE 1 TABLET BY MOUTH EVERY DAY   rosuvastatin (CRESTOR) 5 MG tablet TAKE 1 TABLET BY MOUTH EVERY DAY FOR CHOLESTEROL   Semaglutide, 1 MG/DOSE, (OZEMPIC, 1 MG/DOSE,) 4 MG/3ML SOPN INJECT 1 MG AS DIRECTED ONCE A WEEK. FOR DIABETES.   Spacer/Aero-Holding Chambers (AEROCHAMBER MV) inhaler Use as instructed   [DISCONTINUED] prochlorperazine (COMPAZINE) 10 MG tablet Take 1 tablet (10 mg total) by mouth every 6 (six) hours as needed (Nausea or vomiting).   No facility-administered encounter medications on file as of 07/05/2023.     Review of Systems  Review of Systems  N/A Physical Exam  BP 99/68   Pulse (!) 103   Temp 98.1 F (36.7 C)   Ht 5\' 3"  (1.6 m)   Wt 145 lb  (65.8 kg)   SpO2 95% Comment: 2 l of o2 portable  BMI 25.69 kg/m   Wt Readings from Last 5 Encounters:  07/05/23 145 lb (65.8 kg)  06/01/23 148 lb 9.6 oz (67.4 kg)  05/23/23 151 lb 11.2 oz (68.8 kg)  04/16/23 153 lb 9.6 oz (69.7 kg)  03/27/23 163 lb 2.3 oz (74 kg)    BMI Readings from Last 5 Encounters:  07/05/23 25.69 kg/m  06/01/23 26.32 kg/m  05/23/23 26.87 kg/m  04/16/23 27.21 kg/m  03/27/23 28.90 kg/m     Physical Exam General: Chronically ill-appearing, in wheelchair, no acute distress Neck: Habitus precludes JVP evaluation, supple Eyes: EOMI, no icterus Respiratory: Distant breath sounds, diffuse end expiratory wheeze Abdomen: Nondistended, bowel sounds present MSK: No synovitis, no joint effusion Neuro: No weakness, no sensation  deficit Psych: Normal mood, full affect   Assessment & Plan:   Severe COPD with recent exacerbation: PFT 2021 with FEV1 just over 30% after bronchodilator.  No significant bronchodilator response.  Not using inhaler medicine, maintenance inhaler.  Symptoms improved with Breztri 2 puffs daily-advised to continue, if dyspnea were to worsen increase to twice daily.  Recently completed course of doxycycline.  Add prednisone taper today given ongoing cough and mucus production, fortunately dyspnea has returned to baseline.  Encouraged physical activity.  Disenrolled from pulmonary rehab with her decompensation recently.  She has a home exercise machine she plans to use and I said she could start this in about a week once her wheezing improves.  Consider referral to pulm rehab for reevaluation in the future as needed.  Chronic hypoxic respiratory failure: Most likely related to severe COPD.  Has some cardiovascular issues as well.  To continue 4 L with exertion as prescribed, does okay on 2 L at rest.     Return in about 3 months (around 10/03/2023) for f/u Dr. Judeth Horn.   Karren Burly, MD 07/05/2023

## 2023-07-14 ENCOUNTER — Other Ambulatory Visit: Payer: Self-pay | Admitting: Primary Care

## 2023-07-14 ENCOUNTER — Other Ambulatory Visit: Payer: Self-pay | Admitting: Hematology and Oncology

## 2023-07-14 DIAGNOSIS — E1165 Type 2 diabetes mellitus with hyperglycemia: Secondary | ICD-10-CM

## 2023-07-23 ENCOUNTER — Encounter: Payer: Self-pay | Admitting: Pulmonary Disease

## 2023-07-28 MED ORDER — ALBUTEROL SULFATE HFA 108 (90 BASE) MCG/ACT IN AERS: 2.0000 | INHALATION_SPRAY | Freq: Four times a day (QID) | RESPIRATORY_TRACT | 3 refills | Status: AC | PRN

## 2023-08-02 ENCOUNTER — Ambulatory Visit (INDEPENDENT_AMBULATORY_CARE_PROVIDER_SITE_OTHER)
Admission: RE | Admit: 2023-08-02 | Discharge: 2023-08-02 | Disposition: A | Discharge status: Home / Self Care | Payer: Medicare Other | Source: Ambulatory Visit | Attending: Primary Care | Admitting: Primary Care

## 2023-08-02 ENCOUNTER — Other Ambulatory Visit: Payer: Self-pay | Admitting: Primary Care

## 2023-08-02 ENCOUNTER — Ambulatory Visit: Payer: BC Managed Care – PPO | Admitting: Primary Care

## 2023-08-02 ENCOUNTER — Encounter: Payer: Self-pay | Admitting: Hematology and Oncology

## 2023-08-02 VITALS — BP 102/68 | HR 110 | Temp 97.2°F | Ht 63.0 in | Wt 143.0 lb

## 2023-08-02 DIAGNOSIS — J90 Pleural effusion, not elsewhere classified: Secondary | ICD-10-CM | POA: Diagnosis not present

## 2023-08-02 DIAGNOSIS — R053 Chronic cough: Secondary | ICD-10-CM | POA: Insufficient documentation

## 2023-08-02 DIAGNOSIS — I700 Atherosclerosis of aorta: Secondary | ICD-10-CM | POA: Diagnosis not present

## 2023-08-02 MED ORDER — AMOXICILLIN-POT CLAVULANATE 875-125 MG PO TABS: 1.0000 | ORAL_TABLET | Freq: Two times a day (BID) | ORAL | 0 refills | Status: DC

## 2023-08-02 MED ORDER — PREDNISONE 20 MG PO TABS
ORAL_TABLET | ORAL | 0 refills | Status: DC
Start: 2023-08-02 — End: 2023-09-12

## 2023-08-02 NOTE — Progress Notes (Signed)
Subjective:    Patient ID: Deborah [name], female    DOB: [birth year], 68 y.o.   MRN: [id number]  Cough Associated symptoms include a fever and shortness of breath. Pertinent negatives include no headaches, postnasal drip or sore throat.    Deborah [name] is a very pleasant 68 y.o. female with a history of CHF, hypertension, chronic respiratory failure on supplemental oxygen, COPD, lung cancer, type 2 diabetes who presents today to discuss cough.  Evaluated by pulmonology on 07/05/2023 for follow-up of recent illness.  Previously prescribed doxycycline which improved chest pain and fevers.  She completed this course 1 week from this visit.  During this visit she reported increased DuoNeb use with improvement in shortness of breath but with continued chest congestion with thick green sputum.  She was prescribed prednisone 40 mg daily x 5 days and 20 mg daily for 5 days her cough and congestion.  Six days ago she lost her voice, felt increased fatigue, worsening and more frequent cough. Yesterday she began feeling better. Her voice has improved, but she continues to experience exertional shortness of breath and cough with thick green sputum. She had fevers 6 days ago, between 100-101.   She's been taking Tylenol, OTC Cough and Flu.    Review of Systems  Constitutional:  Positive for fatigue and fever.  HENT:  Positive for congestion. Negative for postnasal drip and sore throat.   Respiratory:  Positive for cough, chest tightness and shortness of breath.   Neurological:  Negative for headaches.         Past Medical History:  Diagnosis Date   Acute hypoxemic respiratory failure (HCC)    Acute on chronic respiratory failure with hypoxia (HCC) 05/24/2020   Anemia    as teen   Asthma    Breast cancer (HCC)    left breast cancer 02/2020   CHF (congestive heart failure) (HCC)    CHF, acute on chronic, unknown EF 07/25/2014   COPD exacerbation (HCC) 12/18/2015   COPD, mild (HCC)     Depression    Diabetes mellitus without complication (HCC)    Diverticulitis    Diverticulosis of colon with hemorrhage    Dyspnea 04/17/2013   Followed in Pulmonary clinic/ [facility]/ [facility]  - hfa 75% p coaching 04/17/2013   - PFT's 06/05/2013 no airflow obst, dlco 60 corrects to 96%     Family history of anesthesia complication    vomiting   GI bleeding    Heart failure (HCC)    New onset 07/25/14   Histoplasmosis    left eye   Hyperkalemia    Hypertension    Lung cancer (HCC) 05/02/2018   s/p chemoradiation, immunotherapy   Obesity (BMI 30-39.9)    Pneumonia    dx wtih pneumonia on 05/27/16- seen by [facility]    PONV (postoperative nausea and vomiting)    Restrictive lung disease    Severe sepsis (HCC)    Shortness of breath    with exertion    Sleep apnea    mask and oxygen at nite for sleep at 2L    Tobacco abuse    Umbilical hernia     Social History   Socioeconomic History   Marital status: Married    Spouse name: Not on file   Number of children: 1   Years of education: Not on file   Highest education level: 12th grade  Occupational History   Occupation: Retired    Associate Professor: NOT EMPLOYED  Tobacco  Use   Smoking status: Former    Current packs/day: 0.00    Average packs/day: 3.0 packs/day for 43.0 years (129.0 ttl pk-yrs)    Types: Cigarettes    Start date: 12/17/1969    Quit date: 12/17/2012    Years since quitting: 10.6   Smokeless tobacco: Never  Vaping Use   Vaping status: Former  Substance and Sexual Activity   Alcohol use: No    Alcohol/week: 0.0 standard drinks of alcohol   Drug use: No   Sexual activity: Not on file  Other Topics Concern   Not on file  Social History Narrative   ** Merged History Encounter ** ** Data from: 07/25/14 Enc Dept: [facility] ** Data from: 06/05/13 Enc [facility], one son 78 yo.      Watch TV, spend time with family   Social Drivers of Corporate investment banker Strain: Low Risk   (08/02/2023)   Overall Financial Resource Strain (CARDIA)    Difficulty of Paying Living Expenses: Not hard at all  Food Insecurity: No Food Insecurity (08/02/2023)   Hunger Vital Sign    Worried About Running Out of Food in the Last Year: Never true    Ran Out of Food in the Last Year: Never true  Transportation Needs: No Transportation Needs (08/02/2023)   PRAPARE - Administrator, Civil Service (Medical): No    Lack of Transportation (Non-Medical): No  Physical Activity: Unknown (08/02/2023)   Exercise Vital Sign    Days of Exercise per Week: 0 days    Minutes of Exercise per Session: Not on file  Stress: No Stress Concern Present (08/02/2023)   [employer] of Occupational Health - Occupational Stress Questionnaire    Feeling of Stress : Only a little  Social Connections: Moderately Isolated (08/02/2023)   Social Connection and Isolation Panel [NHANES]    Frequency of Communication with Friends and Family: More than three times a week    Frequency of Social Gatherings with Friends and Family: Three times a week    Attends Religious Services: Never    Active Member of Clubs or Organizations: No    Attends Engineer, structural: Not on file    Marital Status: Married  Catering manager Violence: Not on file    Past Surgical History:  Procedure Laterality Date   APPLICATION OF WOUND VAC  03/19/2013   Procedure: APPLICATION OF WOUND VAC;  Surgeon: Lodema Pilot, DO;  Location: [facility];  Service: General;;   BREAST BIOPSY Left 09/29/2021   BREAST BIOPSY Left 10/18/2021   BREAST LUMPECTOMY Left 04/08/2020   BREAST LUMPECTOMY Left 08/16/2022   Procedure: LEFT BREAST LUMPECTOMY;  Surgeon: Harriette Bouillon, MD;  Location: [facility] OR;  Service: General;  Laterality: Left;   BREAST LUMPECTOMY WITH RADIOACTIVE SEED AND SENTINEL LYMPH NODE BIOPSY Left 04/08/2020   Procedure: LEFT BREAST LUMPECTOMY WITH RADIOACTIVE SEED AND LEFT AXILLARY SENTINEL LYMPH NODE BIOPSY;  Surgeon:  Ovidio Kin, MD;  Location: [facility];  Service: General;  Laterality: Left;  PEC BLOCK, NEEDS ANESTHESIA CONSULT   CESAREAN SECTION  04/12/1985   COLONOSCOPY WITH PROPOFOL N/A 06/13/2016   Procedure: COLONOSCOPY WITH PROPOFOL;  Surgeon: Beverley Fiedler, MD;  Location: [facility];  Service: Gastroenterology;  Laterality: N/A;   ERCP N/A 02/15/2017   Procedure: ENDOSCOPIC RETROGRADE CHOLANGIOPANCREATOGRAPHY (ERCP);  Surgeon: Rachael Fee, MD;  Location: [facility];  Service: Endoscopy;  Laterality: N/A;   EYE SURGERY Left    INSERTION OF MESH  N/A 03/19/2013   Procedure: INSERTION OF MESH;  Surgeon: Lodema Pilot, DO;  Location: [facility];  Service: General;  Laterality: N/A;   IR IMAGING GUIDED PORT INSERTION  03/26/2020   IR THORACENTESIS ASP PLEURAL SPACE W/IMG GUIDE  05/25/2020   VENTRAL HERNIA REPAIR N/A 03/19/2013   Procedure:  OPEN VENTRAL HERNIA REPAIR WITH MESH AND APPLICATION OF WOUND VAC;  Surgeon: Lodema Pilot, DO;  Location: [facility];  Service: General;  Laterality: N/A;   VIDEO BRONCHOSCOPY Bilateral 04/19/2018   Procedure: VIDEO BRONCHOSCOPY WITH FLUORO;  Surgeon: Oretha Milch, MD;  Location: [facility];  Service: Cardiopulmonary;  Laterality: Bilateral;    Family History  Problem Relation Age of Onset   Heart attack Mother    Emphysema Mother        was a smoker   Congestive Heart Failure Father    Bipolar disorder Father    Lymphoma Maternal Uncle    Pancreatic cancer Maternal Aunt    Bipolar disorder Sister    Schizophrenia Sister    Other Sister        tumor in her neck   Melanoma Sister    Heart attack Maternal Uncle    Heart attack Maternal Uncle    Colon cancer Neg Hx    Breast cancer Neg Hx     No Known Allergies  Current Outpatient Medications on File Prior to Visit  Medication Sig Dispense Refill   acetaminophen (TYLENOL) 325 MG tablet Take 650 mg by mouth every 6 (six) hours as needed for headache (pain).     albuterol (VENTOLIN HFA) 108 (90 Base)  MCG/ACT inhaler Inhale 2 puffs into the lungs every 6 (six) hours as needed for wheezing or shortness of breath. 8 g 3   anastrozole (ARIMIDEX) 1 MG tablet TAKE 1 TABLET BY MOUTH EVERY DAY 90 tablet 3   aspirin 81 MG chewable tablet Chew 1 tablet (81 mg total) by mouth daily. 30 tablet 1   Blood Glucose Monitoring Suppl DEVI 1 each by Does not apply route in the morning, at noon, and at bedtime. May substitute to any manufacturer covered by patient's insurance. 1 each 0   Budeson-Glycopyrrol-Formoterol (BREZTRI AEROSPHERE) 160-9-4.8 MCG/ACT AERO Inhale 2 puffs into the lungs 2 (two) times daily. 1 each 11   Cholecalciferol (VITAMIN D3) 125 MCG (5000 UT) TABS Take 5,000 Units by mouth daily.     CONTOUR NEXT TEST test strip USE AS DIRECTED IN THE MORNING, AT NOON, AND AT BEDTIME TO CHECK BLOOD GLUCOSE LEVLES 300 strip 1   diltiazem (CARDIZEM CD) 240 MG 24 hr capsule TAKE 1 CAPSULE BY MOUTH EVERY DAY 90 capsule 2   doxycycline (VIBRA-TABS) 100 MG tablet Take 1 tablet (100 mg total) by mouth 2 (two) times daily. 14 tablet 0   furosemide (LASIX) 20 MG tablet TAKE 1 AND 1/2 TABLETS DAILY BY MOUTH 135 tablet 3   ipratropium-albuterol (DUONEB) 0.5-2.5 (3) MG/3ML SOLN Take 3 mLs by nebulization every 4 (four) hours as needed. 360 mL 11   lidocaine-prilocaine (EMLA) cream APPLY TO AFFECTED AREA ONCE AS DIRECTED 30 g 3   montelukast (SINGULAIR) 10 MG tablet TAKE 1 TABLET (10 MG TOTAL) BY MOUTH AT BEDTIME. FOR ALLERGIES 90 tablet 2   OXYGEN Inhale 4 L/min into the lungs continuous.     potassium chloride (KLOR-CON M10) 10 MEQ tablet TAKE 1 TABLET BY MOUTH EVERY DAY 90 tablet 2   rosuvastatin (CRESTOR) 5 MG tablet TAKE 1 TABLET BY MOUTH EVERY DAY FOR  CHOLESTEROL 90 tablet 2   Semaglutide, 1 MG/DOSE, (OZEMPIC, 1 MG/DOSE,) 4 MG/3ML SOPN INJECT 1 MG AS DIRECTED ONCE A WEEK. FOR DIABETES. 9 mL 0   Spacer/Aero-Holding Chambers (AEROCHAMBER MV) inhaler Use as instructed 1 each 0   [DISCONTINUED] prochlorperazine  (COMPAZINE) 10 MG tablet Take 1 tablet (10 mg total) by mouth every 6 (six) hours as needed (Nausea or vomiting). 30 tablet 1   No current facility-administered medications on file prior to visit.    BP 102/68   Pulse (!) 110   Temp (!) 97.2 F (36.2 C) (Temporal)   Ht 5\' 3"  (1.6 m)   Wt 143 lb (64.9 kg)   SpO2 95%   BMI 25.33 kg/m  Objective:   Physical Exam Constitutional:      Appearance: She is not ill-appearing.  HENT:     Right Ear: Tympanic membrane and ear canal normal.     Left Ear: Tympanic membrane and ear canal normal.     Nose: No mucosal edema.     Right Sinus: No maxillary sinus tenderness or frontal sinus tenderness.     Left Sinus: No maxillary sinus tenderness or frontal sinus tenderness.     Mouth/Throat:     Mouth: Mucous membranes are moist.  Eyes:     Conjunctiva/sclera: Conjunctivae normal.  Cardiovascular:     Rate and Rhythm: Normal rate and regular rhythm.  Pulmonary:     Effort: Pulmonary effort is normal.     Breath sounds: Examination of the left-upper field reveals wheezing. Examination of the left-lower field reveals rhonchi. Wheezing and rhonchi present.  Musculoskeletal:     Cervical back: Neck supple.  Skin:    General: Skin is warm and dry.           Assessment & Plan:  Persistent cough for 3 weeks or longer Assessment & Plan: No significant improvement since treatment in December 2024. Exam today overall stable.  She is feeling better today.  Chest x-ray ordered and pending today to rule out infectious cause. Start prednisone tablets. Take two tablets my mouth once daily in the morning for 5 days, then one tablet once daily in the morning for 5 days.   Consider azithromycin antibiotics if warranted. Await chest x-ray results.   Orders: -     DG Chest 2 View -     predniSONE; Take 2 tablets by mouth daily x 5 days, then 1 tablet by mouth daily x 5 days.  Dispense: 15 tablet; Refill: 0        Doreene Nest,  NP

## 2023-08-02 NOTE — Assessment & Plan Note (Signed)
No significant improvement since treatment in December 2024. Exam today overall stable.  She is feeling better today.  Chest x-ray ordered and pending today to rule out infectious cause. Start prednisone tablets. Take two tablets my mouth once daily in the morning for 5 days, then one tablet once daily in the morning for 5 days.   Consider azithromycin antibiotics if warranted. Await chest x-ray results.

## 2023-08-02 NOTE — Patient Instructions (Signed)
Complete xray(s) prior to leaving today. I will notify you of your results once received.  Start prednisone tablets. Take two tablets my mouth once daily in the morning for 5 days, then one tablet once daily in the morning for 5 days.   I will be in touch later today.

## 2023-08-03 ENCOUNTER — Other Ambulatory Visit: Payer: Self-pay | Admitting: Hematology and Oncology

## 2023-08-03 DIAGNOSIS — Z853 Personal history of malignant neoplasm of breast: Secondary | ICD-10-CM

## 2023-09-03 ENCOUNTER — Encounter: Payer: Self-pay | Admitting: Hematology and Oncology

## 2023-09-04 ENCOUNTER — Ambulatory Visit: Payer: Medicare Other | Admitting: Primary Care

## 2023-09-04 ENCOUNTER — Ambulatory Visit: Payer: BC Managed Care – PPO | Admitting: Cardiology

## 2023-09-04 DIAGNOSIS — E1165 Type 2 diabetes mellitus with hyperglycemia: Secondary | ICD-10-CM

## 2023-09-05 ENCOUNTER — Other Ambulatory Visit: Payer: Self-pay | Admitting: Primary Care

## 2023-09-05 DIAGNOSIS — E1165 Type 2 diabetes mellitus with hyperglycemia: Secondary | ICD-10-CM

## 2023-09-05 MED ORDER — OZEMPIC (1 MG/DOSE) 4 MG/3ML ~~LOC~~ SOPN
1.0000 mg | PEN_INJECTOR | SUBCUTANEOUS | 0 refills | Status: DC
Start: 2023-09-05 — End: 2023-11-29

## 2023-09-12 ENCOUNTER — Encounter: Payer: Self-pay | Admitting: Primary Care

## 2023-09-12 ENCOUNTER — Ambulatory Visit (INDEPENDENT_AMBULATORY_CARE_PROVIDER_SITE_OTHER): Payer: Medicare Other | Admitting: Primary Care

## 2023-09-12 VITALS — BP 98/76 | HR 115 | Temp 98.2°F | Ht 63.0 in | Wt 135.0 lb

## 2023-09-12 DIAGNOSIS — Z7985 Long-term (current) use of injectable non-insulin antidiabetic drugs: Secondary | ICD-10-CM

## 2023-09-12 DIAGNOSIS — E1165 Type 2 diabetes mellitus with hyperglycemia: Secondary | ICD-10-CM | POA: Diagnosis not present

## 2023-09-12 LAB — POCT GLYCOSYLATED HEMOGLOBIN (HGB A1C): Hemoglobin A1C: 5.2 % (ref 4.0–5.6)

## 2023-09-12 NOTE — Assessment & Plan Note (Addendum)
 Controlled, A1c 5.2% today  Continue Ozempic 1mg /weekly Continue statin, target LDL less than 70 (at goal)  Discussed weight loss trends and possible reduction in GLP-1 dose. Patient declines today. Discussed increasing dietary sources of protein. Education/information provided.   Microalbumin/creatinine ratio, urine - ordered, results pending  Follow up in 6 months for physical and labs  I evaluated patient, was consulted regarding treatment, and agree with assessment and plan per Julaine Fusi, MSN, FNP student.   Mayra Reel, NP-C

## 2023-09-12 NOTE — Progress Notes (Cosign Needed Addendum)
 Established Patient Office Visit  Subjective   Patient ID: Deborah [name], female    DOB: Apr 28, 1956  Age: 68 y.o. MRN: [id number]  Chief Complaint  Patient presents with   Medical Management of Chronic Issues    HPI  Deborah [name] is a 68 year old female with history of CHF, hypertension, chronic respiratory failure on supplemental oxygen, COPD, lung cancer, type 2 diabetes mellitus who presents today for routine diabetes follow up.   She denies polyuria, polydipsia, polyphagia. She notes diminished appetite. No episodes of hypoglycemia. Notes isolated episode of nausea 3 weeks ago when she was taking Augmentin and prednisone, however this resolved upon completion of therapy. Denies diarrhea, vomiting. Reports constipation, which is a chronic issue, that has improved with diet modifications.   Current diabetic medications include:  Ozempic 1mg  once weekly  She is checking her blood glucose 2x daily times daily and is getting readings of: Before meals - 80s-110s Postprandial - 135  Last A1C: 5.4% (02/2023) Last Eye Exam: overdue, will schedule  Last Foot Exam: due 11/2023 Pneumonia Vaccination: UTD Urine Microalbumin: due today  Statin: rosuvastatin 5mg  daily  Diet consists of:  Breakfast: none Lunch: crackers with cheese, fruit Dinner: hamburger patty, baked chicken, vegetables, occasional baked potato  Snacks: sugar free jello, sugar free pudding, canned fruit no sugar added, fresh fruit Desserts: none Beverages: 3-Dr Pepper Zero/daily, water, protein shake   Exercise: none   BP Readings from Last 3 Encounters:  09/12/23 98/76  08/02/23 102/68  07/05/23 99/68   Wt Readings from Last 3 Encounters:  09/12/23 61.2 kg  08/02/23 64.9 kg  07/05/23 65.8 kg      Review of Systems  Constitutional:  Positive for weight loss. Negative for malaise/fatigue.  Respiratory:  Positive for cough and shortness of breath.   Cardiovascular:  Negative for chest pain, palpitations  and leg swelling.  Gastrointestinal:  Positive for constipation. Negative for diarrhea, nausea and vomiting.  Genitourinary:  Negative for dysuria, frequency and urgency.  Endo/Heme/Allergies:  Negative for polydipsia.      Objective:     BP 98/76   Pulse (!) 115   Temp 98.2 F (36.8 C) (Temporal)   Ht 5\' 3"  (1.6 m)   Wt 61.2 kg   SpO2 97% Comment: 2 L O2  BMI 23.91 kg/m    Physical Exam Constitutional:      General: She is not in acute distress.    Appearance: Normal appearance.  Cardiovascular:     Rate and Rhythm: Regular rhythm. Tachycardia present.     Pulses:          Radial pulses are 2+ on the right side and 2+ on the left side.       Popliteal pulses are 2+ on the right side and 2+ on the left side.  Pulmonary:     Effort: Pulmonary effort is normal.     Breath sounds: Examination of the right-upper field reveals wheezing. Examination of the left-upper field reveals wheezing. Examination of the right-lower field reveals wheezing. Examination of the left-lower field reveals wheezing. Wheezing present.     Comments: On supplemental O2 Musculoskeletal:     Right lower leg: No edema.     Left lower leg: No edema.  Neurological:     General: No focal deficit present.     Mental Status: She is alert and oriented to person, place, and time.  Psychiatric:        Mood and Affect: Mood normal.  Behavior: Behavior normal.        Thought Content: Thought content normal.     Results for orders placed or performed in visit on 09/12/23  POCT glycosylated hemoglobin (Hb A1C)  Result Value Ref Range   Hemoglobin A1C 5.2 4.0 - 5.6 %   HbA1c POC (<> result, manual entry)     HbA1c, POC (prediabetic range)     HbA1c, POC (controlled diabetic range)        The ASCVD Risk score (Arnett DK, et al., 2019) failed to calculate for the following reasons:   The valid total cholesterol range is 130 to 320 mg/dL    Assessment & Plan:   Problem List Items Addressed  This Visit       Endocrine   Type 2 diabetes mellitus with hyperglycemia (HCC) - Primary   Controlled, A1c 5.2% today  Continue Ozempic 1mg /weekly Continue statin, target LDL less than 70 (at goal)  Discussed weight loss trends and possible reduction in GLP-1 dose. Patient declines today. Discussed increasing dietary sources of protein. Education/information provided.   Microalbumin/creatinine ratio, urine - ordered, results pending  Follow up in 6 months for physical and labs      Relevant Orders   POCT glycosylated hemoglobin (Hb A1C) (Completed)   Follow up in 6 months for annual physical and labs.   Lindell Spar, RN

## 2023-09-12 NOTE — Progress Notes (Signed)
 Subjective:    Patient ID: Deborah [name], female    DOB: 03-17-1956, 68 y.o.   MRN: [id number]  HPI  Deborah [name] is a very pleasant 68 y.o. female with a history of type 2 diabetes, CHF, pulmonary hypertension, chronic respiratory failure, lung cancer, COPD, hyperlipidemia who presents today for follow up of diabetes.  Current medications include: Ozempic 1 mg weekly  She is checking her blood glucose 2 times daily and is getting readings of:  AM fasting: 80-120 Post prandial: 130-150  Last A1C: 5.4 in August 2024, 5.2 today Last Eye Exam: UTD Last Foot Exam: UTD Pneumonia Vaccination: 2024 Urine Microalbumin: UTD Statin: rosuvastatin   Dietary changes since last visit: Decreased appetite. Snacks during the day, full dinner at bedtime.    Exercise: None  Wt Readings from Last 3 Encounters:  09/12/23 135 lb (61.2 kg)  08/02/23 143 lb (64.9 kg)  07/05/23 145 lb (65.8 kg)   BP Readings from Last 3 Encounters:  09/12/23 98/76  08/02/23 102/68  07/05/23 99/68         Review of Systems  Respiratory:  Positive for shortness of breath.   Cardiovascular:  Negative for chest pain and palpitations.  Endocrine: Negative for polydipsia, polyphagia and polyuria.  Neurological:  Negative for numbness.         Past Medical History:  Diagnosis Date   Acute hypoxemic respiratory failure (HCC)    Acute on chronic respiratory failure with hypoxia (HCC) 05/24/2020   Anemia    as teen   Asthma    Breast cancer (HCC)    left breast cancer 02/2020   CHF (congestive heart failure) (HCC)    CHF, acute on chronic, unknown EF 07/25/2014   COPD exacerbation (HCC) 12/18/2015   COPD, mild (HCC)    Depression    Diabetes mellitus without complication (HCC)    Diverticulitis    Diverticulosis of colon with hemorrhage    Dyspnea 04/17/2013   Followed in Pulmonary clinic/ [facility]/ [facility]  - hfa 75% p coaching 04/17/2013   - PFT's 06/05/2013 no airflow obst, dlco  60 corrects to 96%     Family history of anesthesia complication    vomiting   GI bleeding    Heart failure (HCC)    New onset 07/25/14   Histoplasmosis    left eye   Hyperkalemia    Hypertension    Lung cancer (HCC) 05/02/2018   s/p chemoradiation, immunotherapy   Obesity (BMI 30-39.9)    Pneumonia    dx wtih pneumonia on 05/27/16- seen by [facility]    PONV (postoperative nausea and vomiting)    Restrictive lung disease    Severe sepsis (HCC)    Shortness of breath    with exertion    Sleep apnea    mask and oxygen at nite for sleep at 2L    Tobacco abuse    Umbilical hernia     Social History   Socioeconomic History   Marital status: Married    Spouse name: Not on file   Number of children: 1   Years of education: Not on file   Highest education level: 12th grade  Occupational History   Occupation: Retired    Associate Professor: NOT EMPLOYED  Tobacco Use   Smoking status: Former    Current packs/day: 0.00    Average packs/day: 3.0 packs/day for 43.0 years (129.0 ttl pk-yrs)    Types: Cigarettes    Start date: 12/17/1969    Quit  date: 12/17/2012    Years since quitting: 10.7   Smokeless tobacco: Never  Vaping Use   Vaping status: Former  Substance and Sexual Activity   Alcohol use: No    Alcohol/week: 0.0 standard drinks of alcohol   Drug use: No   Sexual activity: Not on file  Other Topics Concern   Not on file  Social History Narrative   ** Merged History Encounter ** ** Data from: 07/25/14 Enc Dept: [facility] ** Data from: 06/05/13 Enc [facility], one [name] 68 yo.      Watch TV, spend time with family   Social Drivers of Corporate investment banker Strain: Low Risk  (08/02/2023)   Overall Financial Resource Strain (CARDIA)    Difficulty of Paying Living Expenses: Not hard at all  Food Insecurity: No Food Insecurity (08/02/2023)   Hunger Vital Sign    Worried About Running Out of Food in the Last Year: Never true    Ran Out of Food in the  Last Year: Never true  Transportation Needs: No Transportation Needs (08/02/2023)   PRAPARE - Administrator, Civil Service (Medical): No    Lack of Transportation (Non-Medical): No  Physical Activity: Unknown (08/02/2023)   Exercise Vital Sign    Days of Exercise per Week: 0 days    Minutes of Exercise per Session: Not on file  Stress: No Stress Concern Present (08/02/2023)   [employer] of Occupational Health - Occupational Stress Questionnaire    Feeling of Stress : Only a little  Social Connections: Moderately Isolated (08/02/2023)   Social Connection and Isolation Panel [NHANES]    Frequency of Communication with Friends and Family: More than three times a week    Frequency of Social Gatherings with Friends and Family: Three times a week    Attends Religious Services: Never    Active Member of Clubs or Organizations: No    Attends Engineer, structural: Not on file    Marital Status: Married  Catering manager Violence: Not on file    Past Surgical History:  Procedure Laterality Date   APPLICATION OF WOUND VAC  03/19/2013   Procedure: APPLICATION OF WOUND VAC;  Surgeon: Lodema Pilot, DO;  Location: [facility];  Service: General;;   BREAST BIOPSY Left 09/29/2021   BREAST BIOPSY Left 10/18/2021   BREAST LUMPECTOMY Left 04/08/2020   BREAST LUMPECTOMY Left 08/16/2022   Procedure: LEFT BREAST LUMPECTOMY;  Surgeon: Harriette Bouillon, MD;  Location: [facility] OR;  Service: General;  Laterality: Left;   BREAST LUMPECTOMY WITH RADIOACTIVE SEED AND SENTINEL LYMPH NODE BIOPSY Left 04/08/2020   Procedure: LEFT BREAST LUMPECTOMY WITH RADIOACTIVE SEED AND LEFT AXILLARY SENTINEL LYMPH NODE BIOPSY;  Surgeon: Ovidio Kin, MD;  Location: [facility] OR;  Service: General;  Laterality: Left;  PEC BLOCK, NEEDS ANESTHESIA CONSULT   CESAREAN SECTION  04/12/1985   COLONOSCOPY WITH PROPOFOL N/A 06/13/2016   Procedure: COLONOSCOPY WITH PROPOFOL;  Surgeon: Beverley Fiedler, MD;  Location: [facility];   Service: Gastroenterology;  Laterality: N/A;   ERCP N/A 02/15/2017   Procedure: ENDOSCOPIC RETROGRADE CHOLANGIOPANCREATOGRAPHY (ERCP);  Surgeon: Rachael Fee, MD;  Location: [facility];  Service: Endoscopy;  Laterality: N/A;   EYE SURGERY Left    INSERTION OF MESH N/A 03/19/2013   Procedure: INSERTION OF MESH;  Surgeon: Lodema Pilot, DO;  Location: [facility];  Service: General;  Laterality: N/A;   IR IMAGING GUIDED PORT INSERTION  03/26/2020   IR THORACENTESIS ASP PLEURAL SPACE W/IMG  GUIDE  05/25/2020   VENTRAL HERNIA REPAIR N/A 03/19/2013   Procedure:  OPEN VENTRAL HERNIA REPAIR WITH MESH AND APPLICATION OF WOUND VAC;  Surgeon: Lodema Pilot, DO;  Location: [facility];  Service: General;  Laterality: N/A;   VIDEO BRONCHOSCOPY Bilateral 04/19/2018   Procedure: VIDEO BRONCHOSCOPY WITH FLUORO;  Surgeon: Oretha Milch, MD;  Location: [facility];  Service: Cardiopulmonary;  Laterality: Bilateral;    Family History  Problem Relation Age of Onset   Heart attack Mother    Emphysema Mother        was a smoker   Congestive Heart Failure Father    Bipolar disorder Father    Lymphoma Maternal Uncle    Pancreatic cancer Maternal Aunt    Bipolar disorder Sister    Schizophrenia Sister    Other Sister        tumor in her neck   Melanoma Sister    Heart attack Maternal Uncle    Heart attack Maternal Uncle    Colon cancer Neg Hx    Breast cancer Neg Hx     No Known Allergies  Current Outpatient Medications on File Prior to Visit  Medication Sig Dispense Refill   acetaminophen (TYLENOL) 325 MG tablet Take 650 mg by mouth every 6 (six) hours as needed for headache (pain).     albuterol (VENTOLIN HFA) 108 (90 Base) MCG/ACT inhaler Inhale 2 puffs into the lungs every 6 (six) hours as needed for wheezing or shortness of breath. 8 g 3   anastrozole (ARIMIDEX) 1 MG tablet TAKE 1 TABLET BY MOUTH EVERY DAY 90 tablet 3   aspirin 81 MG chewable tablet Chew 1 tablet (81 mg total) by mouth daily. 30  tablet 1   Blood Glucose Monitoring Suppl DEVI 1 each by Does not apply route in the morning, at noon, and at bedtime. May substitute to any manufacturer covered by patient's insurance. 1 each 0   Budeson-Glycopyrrol-Formoterol (BREZTRI AEROSPHERE) 160-9-4.8 MCG/ACT AERO Inhale 2 puffs into the lungs 2 (two) times daily. 1 each 11   Cholecalciferol (VITAMIN D3) 125 MCG (5000 UT) TABS Take 5,000 Units by mouth daily.     CONTOUR NEXT TEST test strip USE AS DIRECTED IN THE MORNING, AT NOON, AND AT BEDTIME TO CHECK BLOOD GLUCOSE LEVLES 300 strip 1   diltiazem (CARDIZEM CD) 240 MG 24 hr capsule TAKE 1 CAPSULE BY MOUTH EVERY DAY 90 capsule 2   furosemide (LASIX) 20 MG tablet TAKE 1 AND 1/2 TABLETS DAILY BY MOUTH 135 tablet 3   ipratropium-albuterol (DUONEB) 0.5-2.5 (3) MG/3ML SOLN Take 3 mLs by nebulization every 4 (four) hours as needed. 360 mL 11   montelukast (SINGULAIR) 10 MG tablet TAKE 1 TABLET (10 MG TOTAL) BY MOUTH AT BEDTIME. FOR ALLERGIES 90 tablet 2   OXYGEN Inhale 4 L/min into the lungs continuous.     potassium chloride (KLOR-CON M10) 10 MEQ tablet TAKE 1 TABLET BY MOUTH EVERY DAY 90 tablet 2   rosuvastatin (CRESTOR) 5 MG tablet TAKE 1 TABLET BY MOUTH EVERY DAY FOR CHOLESTEROL 90 tablet 2   Semaglutide, 1 MG/DOSE, (OZEMPIC, 1 MG/DOSE,) 4 MG/3ML SOPN Inject 1 mg into the skin once a week. for diabetes. 9 mL 0   Spacer/Aero-Holding Chambers (AEROCHAMBER MV) inhaler Use as instructed 1 each 0   lidocaine-prilocaine (EMLA) cream APPLY TO AFFECTED AREA ONCE AS DIRECTED (Patient not taking: Reported on 09/12/2023) 30 g 3   [DISCONTINUED] prochlorperazine (COMPAZINE) 10 MG tablet Take 1 tablet (10 mg total)  by mouth every 6 (six) hours as needed (Nausea or vomiting). 30 tablet 1   No current facility-administered medications on file prior to visit.    BP 98/76   Pulse (!) 115   Temp 98.2 F (36.8 C) (Temporal)   Ht 5\' 3"  (1.6 m)   Wt 135 lb (61.2 kg)   SpO2 97% Comment: 2 L O2  BMI 23.91  kg/m  Objective:   Physical Exam Cardiovascular:     Rate and Rhythm: Normal rate and regular rhythm.  Pulmonary:     Effort: Pulmonary effort is normal.     Breath sounds: Normal breath sounds.  Musculoskeletal:     Cervical back: Neck supple.  Skin:    General: Skin is warm and dry.  Neurological:     Mental Status: She is alert and oriented to person, place, and time.  Psychiatric:        Mood and Affect: Mood normal.           Assessment & Plan:  Type 2 diabetes mellitus with hyperglycemia, without long-term current use of insulin (HCC) Assessment & Plan: Controlled, A1c 5.2% today  Continue Ozempic 1mg /weekly Continue statin, target LDL less than 70 (at goal)  Discussed weight loss trends and possible reduction in GLP-1 dose. Patient declines today. Discussed increasing dietary sources of protein. Education/information provided.   Microalbumin/creatinine ratio, urine - ordered, results pending  Follow up in 6 months for physical and labs  I evaluated patient, was consulted regarding treatment, and agree with assessment and plan per Julaine Fusi, MSN, FNP student.   Mayra Reel, NP-C   Orders: -     POCT glycosylated hemoglobin (Hb A1C) -     Microalbumin / creatinine urine ratio; Future        Doreene Nest, NP

## 2023-09-12 NOTE — Patient Instructions (Signed)
 Urine test: please bring specimen by when able   Diet and weight: increase dietary sources of protein with meals Meat, poultry, and fish. Eggs, cheese, and milk. Beans and nuts. Fruit and yogurt smoothies. Oatmeal with nut butter. Nutrition supplement drinks.  Follow up: 6 months for physical

## 2023-09-17 DIAGNOSIS — H31003 Unspecified chorioretinal scars, bilateral: Secondary | ICD-10-CM | POA: Diagnosis not present

## 2023-09-17 DIAGNOSIS — H52201 Unspecified astigmatism, right eye: Secondary | ICD-10-CM | POA: Diagnosis not present

## 2023-09-18 ENCOUNTER — Encounter: Payer: Self-pay | Admitting: Hematology and Oncology

## 2023-09-21 ENCOUNTER — Encounter: Payer: Self-pay | Admitting: Hematology and Oncology

## 2023-09-21 ENCOUNTER — Telehealth: Payer: Self-pay | Admitting: Internal Medicine

## 2023-09-21 NOTE — Telephone Encounter (Signed)
 Patient scheduled port flush appts.

## 2023-09-26 ENCOUNTER — Telehealth: Payer: Self-pay

## 2023-09-26 NOTE — Telephone Encounter (Signed)
 Called pt to see if they were using their cpap for their appointment tomorrow  NFN

## 2023-09-27 ENCOUNTER — Ambulatory Visit: Payer: BC Managed Care – PPO | Admitting: Pulmonary Disease

## 2023-09-27 ENCOUNTER — Encounter: Payer: Self-pay | Admitting: Hematology and Oncology

## 2023-09-27 ENCOUNTER — Encounter: Payer: Self-pay | Admitting: Pulmonary Disease

## 2023-09-27 ENCOUNTER — Inpatient Hospital Stay: Attending: Internal Medicine

## 2023-09-27 VITALS — BP 97/73 | HR 129 | Temp 98.3°F | Resp 18

## 2023-09-27 VITALS — BP 104/73 | HR 118 | Ht 63.0 in | Wt 134.4 lb

## 2023-09-27 DIAGNOSIS — Z95828 Presence of other vascular implants and grafts: Secondary | ICD-10-CM

## 2023-09-27 DIAGNOSIS — Z170 Estrogen receptor positive status [ER+]: Secondary | ICD-10-CM | POA: Diagnosis not present

## 2023-09-27 DIAGNOSIS — C50412 Malignant neoplasm of upper-outer quadrant of left female breast: Secondary | ICD-10-CM | POA: Diagnosis not present

## 2023-09-27 DIAGNOSIS — Z79811 Long term (current) use of aromatase inhibitors: Secondary | ICD-10-CM | POA: Diagnosis not present

## 2023-09-27 DIAGNOSIS — Z452 Encounter for adjustment and management of vascular access device: Secondary | ICD-10-CM | POA: Diagnosis not present

## 2023-09-27 DIAGNOSIS — J432 Centrilobular emphysema: Secondary | ICD-10-CM | POA: Diagnosis not present

## 2023-09-27 DIAGNOSIS — J9611 Chronic respiratory failure with hypoxia: Secondary | ICD-10-CM

## 2023-09-27 DIAGNOSIS — J9610 Chronic respiratory failure, unspecified whether with hypoxia or hypercapnia: Secondary | ICD-10-CM | POA: Diagnosis not present

## 2023-09-27 MED ORDER — HEPARIN SOD (PORK) LOCK FLUSH 100 UNIT/ML IV SOLN
500.0000 [IU] | Freq: Once | INTRAVENOUS | Status: AC
  Administered 2023-09-27: 500 [IU]

## 2023-09-27 MED ORDER — BREZTRI AEROSPHERE 160-9-4.8 MCG/ACT IN AERO: 2.0000 | INHALATION_SPRAY | Freq: Two times a day (BID) | RESPIRATORY_TRACT | 12 refills | Status: AC

## 2023-09-27 MED ORDER — SODIUM CHLORIDE 0.9% FLUSH
10.0000 mL | Freq: Once | INTRAVENOUS | Status: AC
  Administered 2023-09-27: 10 mL

## 2023-09-27 NOTE — Patient Instructions (Addendum)
 No changes to medication  Breztri refilled  Return to clinic in 6 months or sooner as needed

## 2023-09-27 NOTE — Progress Notes (Signed)
 @Patient  ID: Deborah [name], female    DOB: 11/16/1955, 68 y.o.   MRN: [id number]  Chief Complaint  Patient presents with   Follow-up    Referring provider: Doreene Nest, NP  HPI:  68 y.o. whom we are seeing for evaluation of COPD and chronic hypoxemic respiratory failure.  Most recent PCP note reviewed.  Doing well.  Asthma exacerbation at last visit.  But since then doing well.  No exacerbations.  Resumed Breztri.  Doing well with this.  Using twice a day.  2 puffs.  Encouraged to continue.  Refilled today.  Questionaires / Pulmonary Flowsheets:   ACT:      No data to display          MMRC: mMRC Dyspnea Scale mMRC Score  02/23/2020  5:13 PM 3    Epworth:      No data to display          Tests:   FENO:  No results found for: "NITRICOXIDE"  PFT:    Latest Ref Rng & Units 02/18/2020    4:24 PM 01/01/2017    8:40 AM 06/05/2013    9:00 AM  PFT Results  FVC-Pre L 1.29  1.76    FVC-Predicted Pre % 40  54  54      FVC-Post L 1.33  1.88  1.96      FVC-Predicted Post % 42  58  59      Pre FEV1/FVC % % 74  75  82      Post FEV1/FCV % % 75  80  81      FEV1-Pre L 0.95  1.32  1.47      FEV1-Predicted Pre % 38  52  56      FEV1-Post L 1.00  1.50  1.58      DLCO uncorrected ml/min/mmHg 11.41  14.89  13.94      DLCO UNC% % 58  64  60      DLCO corrected ml/min/mmHg 11.41  14.98    DLCO COR %Predicted % 58  65    DLVA Predicted % 108  92  96      TLC L  4.16  4.13      TLC % Predicted %  84  84      RV % Predicted %  118  111         This result is from an external source.  Personally reviewed and interpreted as severe fixed obstruction, DLCO moderately reduced 02/2020 similar findings 12/2016 on my review and interpretation with addition of lung volumes demonstrating normal TLC, borderline but no evidence of air trapping, subsequent worsening FEV1 2021 to compare to 2023.  WALK:     02/12/2023   10:21 AM 04/20/2016    4:38 PM 03/24/2015    5:11 PM  08/26/2014   11:03 AM 04/17/2013    9:21 AM  SIX MIN WALK  Supplimental Oxygen during Test? (L/min)   No Yes No  O2 Flow Rate    2 L/min   Type    Continuous   2 Minute Oxygen Saturation % 96 % 93 %     2 Minute HR 138 125     4 Minute Oxygen Saturation % 96 % 93 %     4 Minute HR 153 132     6 Minute Oxygen Saturation % 97 % 92 %     6 Minute HR 145 154     Tech Comments:  stopped after 2nd lap due to fatigue, sob Pt had c/o sob after completion of 1st lap. Pt was able to complete 3 laps on 2 L 02.     Imaging: No results found.   Lab Results: Personally reviewed CBC    Component Value Date/Time   WBC 14.1 (H) 05/21/2023 1146   WBC 11.7 (H) 08/08/2022 1546   RBC 4.74 05/21/2023 1146   HGB 12.0 05/21/2023 1146   HCT 37.7 05/21/2023 1146   PLT 326 05/21/2023 1146   MCV 79.5 (L) 05/21/2023 1146   MCH 25.3 (L) 05/21/2023 1146   MCHC 31.8 05/21/2023 1146   RDW 15.7 (H) 05/21/2023 1146   LYMPHSABS 1.3 05/21/2023 1146   MONOABS 0.8 05/21/2023 1146   EOSABS 0.1 05/21/2023 1146   BASOSABS 0.1 05/21/2023 1146    BMET    Component Value Date/Time   NA 138 05/21/2023 1146   NA 143 11/23/2016 1504   K 3.9 05/21/2023 1146   CL 99 05/21/2023 1146   CO2 34 (H) 05/21/2023 1146   GLUCOSE 102 (H) 05/21/2023 1146   BUN 14 05/21/2023 1146   BUN 14 11/23/2016 1504   CREATININE 0.63 05/21/2023 1146   CALCIUM 9.4 05/21/2023 1146   GFRNONAA >60 05/21/2023 1146   GFRAA >60 04/02/2020 1400   GFRAA >60 01/26/2020 1019    BNP    Component Value Date/Time   BNP 280.0 (H) 05/26/2020 0505    ProBNP No results found for: "PROBNP"  Specialty Problems       Pulmonary Problems   Chronic respiratory failure (HCC)   Followed in Pulmonary clinic/ [facility]/ [facility] - 04/17/2013  Walked RA x 1 laps @ 185 ft each stopped due to  desat 86% rapid pace  From resting sat 94%      CRLD (chronic restrictive lung disease) secondary to super morbid obesity with mild COPD component    OSA (obstructive sleep apnea)   Auto CPAP 5-10  2 L oxygen blended      COPD (chronic obstructive pulmonary disease) (HCC)   Primary malignant neoplasm of bronchus of left lower lobe (HCC)   Persistent cough for 3 weeks or longer    No Known Allergies  Immunization History  Administered Date(s) Administered   Influenza Inj Mdck Quad Pf 04/22/2022   Influenza, Mdck, Trivalent,PF 6+ MOS(egg free) 05/14/2023   Influenza,inj,Quad PF,6+ Mos 04/09/2013, 07/27/2014, 04/18/2016, 05/28/2017, 04/03/2018, 04/08/2019, 04/22/2021   Moderna Covid-19 Fall Seasonal Vaccine 18yrs & older 05/14/2023   PFIZER(Purple Top)SARS-COV-2 Vaccination 09/28/2019, 10/18/2019   PNEUMOCOCCAL CONJUGATE-20 02/28/2023   Pfizer Covid-19 Vaccine Bivalent Booster 60yrs & up 04/22/2021   Pneumococcal Conjugate-13 01/01/2017   Pneumococcal Polysaccharide-23 07/27/2014   Respiratory Syncytial Virus Vaccine,Recomb Aduvanted(Arexvy) 06/21/2022   Tdap 11/12/2017   Zoster Recombinant(Shingrix) 11/12/2019, 01/14/2020    Past Medical History:  Diagnosis Date   Acute hypoxemic respiratory failure (HCC)    Acute on chronic respiratory failure with hypoxia (HCC) 05/24/2020   Anemia    as teen   Asthma    Breast cancer (HCC)    left breast cancer 02/2020   CHF (congestive heart failure) (HCC)    CHF, acute on chronic, unknown EF 07/25/2014   COPD exacerbation (HCC) 12/18/2015   COPD, mild (HCC)    Depression    Diabetes mellitus without complication (HCC)    Diverticulitis    Diverticulosis of colon with hemorrhage    Dyspnea 04/17/2013   Followed in Pulmonary clinic/ [facility]/ [facility]  - hfa 75% p  coaching 04/17/2013   - PFT's 06/05/2013 no airflow obst, dlco 60 corrects to 96%     Family history of anesthesia complication    vomiting   GI bleeding    Heart failure (HCC)    New onset 07/25/14   Histoplasmosis    left eye   Hyperkalemia    Hypertension    Lung cancer (HCC) 05/02/2018   s/p chemoradiation,  immunotherapy   Obesity (BMI 30-39.9)    Pneumonia    dx wtih pneumonia on 05/27/16- seen by [facility]    PONV (postoperative nausea and vomiting)    Restrictive lung disease    Severe sepsis (HCC)    Shortness of breath    with exertion    Sleep apnea    mask and oxygen at nite for sleep at 2L    Tobacco abuse    Umbilical hernia     Tobacco History: Social History   Tobacco Use  Smoking Status Former   Current packs/day: 0.00   Average packs/day: 3.0 packs/day for 43.0 years (129.0 ttl pk-yrs)   Types: Cigarettes   Start date: 12/17/1969   Quit date: 12/17/2012   Years since quitting: 10.7  Smokeless Tobacco Never   Counseling given: Not Answered   Continue to not smoke  Outpatient Encounter Medications as of 09/27/2023  Medication Sig   acetaminophen (TYLENOL) 325 MG tablet Take 650 mg by mouth every 6 (six) hours as needed for headache (pain).   albuterol (VENTOLIN HFA) 108 (90 Base) MCG/ACT inhaler Inhale 2 puffs into the lungs every 6 (six) hours as needed for wheezing or shortness of breath.   anastrozole (ARIMIDEX) 1 MG tablet TAKE 1 TABLET BY MOUTH EVERY DAY   aspirin 81 MG chewable tablet Chew 1 tablet (81 mg total) by mouth daily.   Blood Glucose Monitoring Suppl DEVI 1 each by Does not apply route in the morning, at noon, and at bedtime. May substitute to any manufacturer covered by patient's insurance.   Cholecalciferol (VITAMIN D3) 125 MCG (5000 UT) TABS Take 5,000 Units by mouth daily.   CONTOUR NEXT TEST test strip USE AS DIRECTED IN THE MORNING, AT NOON, AND AT BEDTIME TO CHECK BLOOD GLUCOSE LEVLES   diltiazem (CARDIZEM CD) 240 MG 24 hr capsule TAKE 1 CAPSULE BY MOUTH EVERY DAY   furosemide (LASIX) 20 MG tablet TAKE 1 AND 1/2 TABLETS DAILY BY MOUTH   ipratropium-albuterol (DUONEB) 0.5-2.5 (3) MG/3ML SOLN Take 3 mLs by nebulization every 4 (four) hours as needed.   lidocaine-prilocaine (EMLA) cream APPLY TO AFFECTED AREA ONCE AS DIRECTED   montelukast  (SINGULAIR) 10 MG tablet TAKE 1 TABLET (10 MG TOTAL) BY MOUTH AT BEDTIME. FOR ALLERGIES   OXYGEN Inhale 4 L/min into the lungs continuous.   potassium chloride (KLOR-CON M10) 10 MEQ tablet TAKE 1 TABLET BY MOUTH EVERY DAY   rosuvastatin (CRESTOR) 5 MG tablet TAKE 1 TABLET BY MOUTH EVERY DAY FOR CHOLESTEROL   Semaglutide, 1 MG/DOSE, (OZEMPIC, 1 MG/DOSE,) 4 MG/3ML SOPN Inject 1 mg into the skin once a week. for diabetes.   Spacer/Aero-Holding Chambers (AEROCHAMBER MV) inhaler Use as instructed   [DISCONTINUED] Budeson-Glycopyrrol-Formoterol (BREZTRI AEROSPHERE) 160-9-4.8 MCG/ACT AERO Inhale 2 puffs into the lungs 2 (two) times daily.   budeson-glycopyrrolate-formoterol (BREZTRI AEROSPHERE) 160-9-4.8 MCG/ACT AERO Inhale 2 puffs into the lungs 2 (two) times daily.   [DISCONTINUED] prochlorperazine (COMPAZINE) 10 MG tablet Take 1 tablet (10 mg total) by mouth every 6 (six) hours as needed (Nausea or vomiting).  No facility-administered encounter medications on file as of 09/27/2023.     Review of Systems  Review of Systems  N/A Physical Exam  BP 104/73 (BP Location: Right Arm, Patient Position: Sitting, Cuff Size: Normal)   Pulse (!) 118   Ht 5\' 3"  (1.6 m)   Wt 134 lb 6.4 oz (61 kg)   SpO2 98%   BMI 23.81 kg/m   Wt Readings from Last 5 Encounters:  09/27/23 134 lb 6.4 oz (61 kg)  09/12/23 135 lb (61.2 kg)  08/02/23 143 lb (64.9 kg)  07/05/23 145 lb (65.8 kg)  06/01/23 148 lb 9.6 oz (67.4 kg)    BMI Readings from Last 5 Encounters:  09/27/23 23.81 kg/m  09/12/23 23.91 kg/m  08/02/23 25.33 kg/m  07/05/23 25.69 kg/m  06/01/23 26.32 kg/m     Physical Exam General: Chronically ill-appearing, in wheelchair, no acute distress Neck: Habitus precludes JVP evaluation, supple Eyes: EOMI, no icterus Respiratory: Distant breath sounds, more distant on the right, clear otherwise Abdomen: Nondistended, bowel sounds present MSK: No synovitis, no joint effusion Neuro: No weakness,  no sensation deficit Psych: Normal mood, full affect   Assessment & Plan:   Severe COPD: PFT 2021 with FEV1 just over 30% after bronchodilator.  No significant bronchodilator response.  Currently well controlled on breztri.   Chronic hypoxic respiratory failure: Most likely related to severe COPD.  Has some cardiovascular issues as well.  To continue 4 L with exertion as prescribed, does okay on 2 L at rest.     Return in about 6 months (around 03/29/2024).   Karren Burly, MD 09/27/2023

## 2023-10-12 ENCOUNTER — Encounter: Payer: Self-pay | Admitting: Hematology and Oncology

## 2023-10-16 ENCOUNTER — Ambulatory Visit: Payer: BC Managed Care – PPO | Admitting: Cardiology

## 2023-10-17 ENCOUNTER — Other Ambulatory Visit: Payer: Self-pay

## 2023-10-17 DIAGNOSIS — I5032 Chronic diastolic (congestive) heart failure: Secondary | ICD-10-CM

## 2023-10-17 DIAGNOSIS — I272 Pulmonary hypertension, unspecified: Secondary | ICD-10-CM

## 2023-10-17 MED ORDER — FUROSEMIDE 20 MG PO TABS: ORAL_TABLET | ORAL | 0 refills | Status: DC

## 2023-10-17 MED ORDER — FUROSEMIDE 20 MG PO TABS: ORAL_TABLET | ORAL | 2 refills | Status: DC

## 2023-10-25 DIAGNOSIS — H10503 Unspecified blepharoconjunctivitis, bilateral: Secondary | ICD-10-CM | POA: Diagnosis not present

## 2023-11-08 DIAGNOSIS — H10503 Unspecified blepharoconjunctivitis, bilateral: Secondary | ICD-10-CM | POA: Diagnosis not present

## 2023-11-08 DIAGNOSIS — H16103 Unspecified superficial keratitis, bilateral: Secondary | ICD-10-CM | POA: Diagnosis not present

## 2023-11-08 DIAGNOSIS — H04123 Dry eye syndrome of bilateral lacrimal glands: Secondary | ICD-10-CM | POA: Diagnosis not present

## 2023-11-09 ENCOUNTER — Other Ambulatory Visit: Payer: Self-pay | Admitting: Cardiovascular Disease

## 2023-11-12 ENCOUNTER — Other Ambulatory Visit: Payer: BC Managed Care – PPO

## 2023-11-12 ENCOUNTER — Other Ambulatory Visit (HOSPITAL_COMMUNITY): Payer: BC Managed Care – PPO

## 2023-11-13 ENCOUNTER — Inpatient Hospital Stay: Payer: BC Managed Care – PPO | Attending: Internal Medicine

## 2023-11-13 ENCOUNTER — Encounter: Payer: Self-pay | Admitting: Hematology and Oncology

## 2023-11-13 ENCOUNTER — Ambulatory Visit (HOSPITAL_COMMUNITY)
Admission: RE | Admit: 2023-11-13 | Discharge: 2023-11-13 | Disposition: A | Discharge status: Home / Self Care | Payer: BC Managed Care – PPO | Source: Ambulatory Visit | Attending: Internal Medicine | Admitting: Internal Medicine

## 2023-11-13 DIAGNOSIS — Z79811 Long term (current) use of aromatase inhibitors: Secondary | ICD-10-CM | POA: Insufficient documentation

## 2023-11-13 DIAGNOSIS — Z85118 Personal history of other malignant neoplasm of bronchus and lung: Secondary | ICD-10-CM | POA: Insufficient documentation

## 2023-11-13 DIAGNOSIS — C3490 Malignant neoplasm of unspecified part of unspecified bronchus or lung: Secondary | ICD-10-CM

## 2023-11-13 DIAGNOSIS — C50412 Malignant neoplasm of upper-outer quadrant of left female breast: Secondary | ICD-10-CM | POA: Insufficient documentation

## 2023-11-13 DIAGNOSIS — Z171 Estrogen receptor negative status [ER-]: Secondary | ICD-10-CM | POA: Insufficient documentation

## 2023-11-13 LAB — CMP (CANCER CENTER ONLY)
ALT: 7 U/L (ref 0–44)
AST: 12 U/L — ABNORMAL LOW (ref 15–41)
Albumin: 3.7 g/dL (ref 3.5–5.0)
Alkaline Phosphatase: 67 U/L (ref 38–126)
Anion gap: 7 (ref 5–15)
BUN: 7 mg/dL — ABNORMAL LOW (ref 8–23)
CO2: 33 mmol/L — ABNORMAL HIGH (ref 22–32)
Calcium: 9.2 mg/dL (ref 8.9–10.3)
Chloride: 102 mmol/L (ref 98–111)
Creatinine: 0.54 mg/dL (ref 0.44–1.00)
GFR, Estimated: >60 mL/min (ref >60)
Glucose, Bld: 98 mg/dL (ref 70–99)
Potassium: 3.8 mmol/L (ref 3.5–5.1)
Sodium: 142 mmol/L (ref 135–145)
Total Bilirubin: 0.3 mg/dL (ref 0.0–1.2)
Total Protein: 6.9 g/dL (ref 6.5–8.1)

## 2023-11-13 LAB — CBC WITH DIFFERENTIAL (CANCER CENTER ONLY)
Abs Immature Granulocytes: 0.04 10*3/uL (ref 0.00–0.07)
Basophils Absolute: 0.1 10*3/uL (ref 0.0–0.1)
Basophils Relative: 1 %
Eosinophils Absolute: 0.2 10*3/uL (ref 0.0–0.5)
Eosinophils Relative: 2 %
HCT: 37.4 % (ref 36.0–46.0)
Hemoglobin: 11.7 g/dL — ABNORMAL LOW (ref 12.0–15.0)
Immature Granulocytes: 0 %
Lymphocytes Relative: 15 %
Lymphs Abs: 1.6 10*3/uL (ref 0.7–4.0)
MCH: 25.1 pg — ABNORMAL LOW (ref 26.0–34.0)
MCHC: 31.3 g/dL (ref 30.0–36.0)
MCV: 80.3 fL (ref 80.0–100.0)
Monocytes Absolute: 0.7 10*3/uL (ref 0.1–1.0)
Monocytes Relative: 7 %
Neutro Abs: 8.2 10*3/uL — ABNORMAL HIGH (ref 1.7–7.7)
Neutrophils Relative %: 75 %
Platelet Count: 326 10*3/uL (ref 150–400)
RBC: 4.66 MIL/uL (ref 3.87–5.11)
RDW: 17.1 % — ABNORMAL HIGH (ref 11.5–15.5)
WBC Count: 10.8 10*3/uL — ABNORMAL HIGH (ref 4.0–10.5)
nRBC: 0 % (ref 0.0–0.2)

## 2023-11-16 ENCOUNTER — Ambulatory Visit (HOSPITAL_COMMUNITY)
Admission: RE | Admit: 2023-11-16 | Discharge: 2023-11-16 | Disposition: A | Discharge status: Home / Self Care | Source: Ambulatory Visit | Attending: Internal Medicine | Admitting: Internal Medicine

## 2023-11-16 DIAGNOSIS — J90 Pleural effusion, not elsewhere classified: Secondary | ICD-10-CM | POA: Diagnosis not present

## 2023-11-16 DIAGNOSIS — C3490 Malignant neoplasm of unspecified part of unspecified bronchus or lung: Secondary | ICD-10-CM | POA: Diagnosis not present

## 2023-11-16 MED ORDER — IOHEXOL 300 MG/ML  SOLN
75.0000 mL | Freq: Once | INTRAMUSCULAR | Status: AC | PRN
  Administered 2023-11-16: 75 mL via INTRAVENOUS

## 2023-11-16 MED ORDER — HEPARIN SOD (PORK) LOCK FLUSH 100 UNIT/ML IV SOLN
500.0000 [IU] | Freq: Once | INTRAVENOUS | Status: AC
  Administered 2023-11-16: 100 [IU] via INTRAVENOUS

## 2023-11-20 ENCOUNTER — Inpatient Hospital Stay: Payer: BC Managed Care – PPO | Attending: Internal Medicine | Admitting: Internal Medicine

## 2023-11-20 VITALS — BP 104/70 | HR 112 | Temp 98.7°F | Resp 19 | Ht 63.0 in | Wt 126.7 lb

## 2023-11-20 DIAGNOSIS — Z85118 Personal history of other malignant neoplasm of bronchus and lung: Secondary | ICD-10-CM | POA: Diagnosis not present

## 2023-11-20 DIAGNOSIS — C3490 Malignant neoplasm of unspecified part of unspecified bronchus or lung: Secondary | ICD-10-CM

## 2023-11-20 DIAGNOSIS — Z79811 Long term (current) use of aromatase inhibitors: Secondary | ICD-10-CM | POA: Diagnosis not present

## 2023-11-20 DIAGNOSIS — C50412 Malignant neoplasm of upper-outer quadrant of left female breast: Secondary | ICD-10-CM | POA: Diagnosis not present

## 2023-11-20 NOTE — Progress Notes (Signed)
 [facility] [phone]   [phone]  OFFICE PROGRESS NOTE  [name], NP [address] [name] [address]  DIAGNOSIS:  1) stage IIIB (T3, N2, M0) non-small cell lung cancer, squamous cell carcinoma presented with large left lower lobe lung mass in addition to mediastinal lymphadenopathy diagnosed in October 2019. PDL 1 expression is negative 2) recurrent breast cancer initially diagnosed as stage IIb (T2, N0, M0) left breast invasive ductal carcinoma involving the upper outer quadrant of the left breast diagnosed in August 2021.  PRIOR THERAPY:  1) Concurrent chemoradiation with weekly carboplatin  for AUC of 2 and paclitaxel  45 mg/M2.  Status post 7 cycles with partial response. 2) Consolidation treatment with immunotherapy with Imfinzi  10 mg/KG every 2 weeks.  First dose July 31, 2018.  Status post 26 cycles. 3) status post left lumpectomy followed by adjuvant chemotherapy with Adriamycin , Cytoxan  and Taxol   CURRENT THERAPY: Femara  2.5 mg p.o. daily under the care of Dr. Gudena started on November 11, 2021  INTERVAL HISTORY: Deborah [name] 68 y.o. female returns to the clinic today for follow-up visit accompanied by her husband.Discussed the use of AI scribe software for clinical note transcription with the patient, who gave verbal consent to proceed.  History of Present Illness   Deborah [name] is a 68 year old female with stage III B non-small cell lung cancer and recurrent breast cancer who presents for evaluation and repeat CT scan of the chest for restaging of her disease. She is accompanied by her husband.  She has a history of stage III B non-small cell lung cancer, squamous cell carcinoma, and completed a course of concurrent chemoradiation followed by consolidation treatment with immunotherapy with Imfinzi  for one year, completed in December 2021. She has been on observation since then. A recent CT scan was performed for  restaging, and she awaits the official report.  She also has a history of recurrent breast cancer, diagnosed in August 2021, and is currently on treatment with Femara  2.5 mg PO daily. No issues with this medication.  She experienced a lung infection in February or March, from which she is still recovering. Her breathing has improved, and she feels 'fine' and 'a little better' when walking from her living room to the car. She uses four liters of oxygen  when walking that distance and two liters at home.  She notes weight loss and mentions something going on with her eyes.       MEDICAL HISTORY: Past Medical History:  Diagnosis Date   Acute hypoxemic respiratory failure (HCC)    Acute on chronic respiratory failure with hypoxia (HCC) 05/24/2020   Anemia    as teen   Asthma    Breast cancer (HCC)    left breast cancer 02/2020   CHF (congestive heart failure) (HCC)    CHF, acute on chronic, unknown EF 07/25/2014   COPD exacerbation (HCC) 12/18/2015   COPD, mild (HCC)    Depression    Diabetes mellitus without complication (HCC)    Diverticulitis    Diverticulosis of colon with hemorrhage    Dyspnea 04/17/2013   Followed in Pulmonary clinic/ [facility]/ [facility]  - hfa 75% p coaching 04/17/2013   - PFT's 06/05/2013 no airflow obst, dlco 60 corrects to 96%     Family history of anesthesia complication    vomiting   GI bleeding    Heart failure (HCC)    New onset 07/25/14  Histoplasmosis    left eye   Hyperkalemia    Hypertension    Lung cancer (HCC) 05/02/2018   s/p chemoradiation, immunotherapy   Obesity (BMI 30-39.9)    Pneumonia    dx wtih pneumonia on 05/27/16- seen by [facility]    PONV (postoperative nausea and vomiting)    Restrictive lung disease    Severe sepsis (HCC)    Shortness of breath    with exertion    Sleep apnea    mask and oxygen  at nite for sleep at 2L    Tobacco abuse    Umbilical hernia     ALLERGIES:  has no known allergies.  MEDICATIONS:   Current Outpatient Medications  Medication Sig Dispense Refill   acetaminophen  (TYLENOL ) 325 MG tablet Take 650 mg by mouth every 6 (six) hours as needed for headache (pain).     albuterol  (VENTOLIN  HFA) 108 (90 Base) MCG/ACT inhaler Inhale 2 puffs into the lungs every 6 (six) hours as needed for wheezing or shortness of breath. 8 g 3   anastrozole  (ARIMIDEX ) 1 MG tablet TAKE 1 TABLET BY MOUTH EVERY DAY 90 tablet 3   aspirin  81 MG chewable tablet Chew 1 tablet (81 mg total) by mouth daily. 30 tablet 1   Blood Glucose Monitoring Suppl DEVI 1 each by Does not apply route in the morning, at noon, and at bedtime. May substitute to any manufacturer covered by patient's insurance. 1 each 0   budeson-glycopyrrolate -formoterol  (BREZTRI  AEROSPHERE) 160-9-4.8 MCG/ACT AERO Inhale 2 puffs into the lungs 2 (two) times daily. 1 each 12   Cholecalciferol (VITAMIN D3) 125 MCG (5000 UT) TABS Take 5,000 Units by mouth daily.     CONTOUR NEXT TEST test strip USE AS DIRECTED IN THE MORNING, AT NOON, AND AT BEDTIME TO CHECK BLOOD GLUCOSE LEVLES 300 strip 1   diltiazem  (CARDIZEM  CD) 240 MG 24 hr capsule TAKE 1 CAPSULE BY MOUTH EVERY DAY 90 capsule 2   furosemide  (LASIX ) 20 MG tablet TAKE 1 AND 1/2 TABLETS DAILY BY MOUTH. 135 tablet 2   ipratropium-albuterol  (DUONEB) 0.5-2.5 (3) MG/3ML SOLN Take 3 mLs by nebulization every 4 (four) hours as needed. 360 mL 11   lidocaine -prilocaine  (EMLA ) cream APPLY TO AFFECTED AREA ONCE AS DIRECTED 30 g 3   montelukast  (SINGULAIR ) 10 MG tablet TAKE 1 TABLET (10 MG TOTAL) BY MOUTH AT BEDTIME. FOR ALLERGIES 90 tablet 2   OXYGEN  Inhale 4 L/min into the lungs continuous.     potassium chloride  (KLOR-CON  M10) 10 MEQ tablet TAKE 1 TABLET BY MOUTH EVERY DAY 90 tablet 2   rosuvastatin  (CRESTOR ) 5 MG tablet TAKE 1 TABLET BY MOUTH EVERY DAY FOR CHOLESTEROL 90 tablet 2   Semaglutide , 1 MG/DOSE, (OZEMPIC , 1 MG/DOSE,) 4 MG/3ML SOPN Inject 1 mg into the skin once a week. for diabetes. 9 mL 0    Spacer/Aero-Holding Chambers (AEROCHAMBER MV) inhaler Use as instructed 1 each 0   No current facility-administered medications for this visit.    SURGICAL HISTORY:  Past Surgical History:  Procedure Laterality Date   APPLICATION OF WOUND VAC  03/19/2013   Procedure: APPLICATION OF WOUND VAC;  Surgeon: Evander Hills, DO;  Location: [facility];  Service: General;;   BREAST BIOPSY Left 09/29/2021   BREAST BIOPSY Left 10/18/2021   BREAST LUMPECTOMY Left 04/08/2020   BREAST LUMPECTOMY Left 08/16/2022   Procedure: LEFT BREAST LUMPECTOMY;  Surgeon: Sim Dryer, MD;  Location: [facility] OR;  Service: General;  Laterality: Left;   BREAST LUMPECTOMY WITH  RADIOACTIVE SEED AND SENTINEL LYMPH NODE BIOPSY Left 04/08/2020   Procedure: LEFT BREAST LUMPECTOMY WITH RADIOACTIVE SEED AND LEFT AXILLARY SENTINEL LYMPH NODE BIOPSY;  Surgeon: Juanita Norlander, MD;  Location: [facility] OR;  Service: General;  Laterality: Left;  PEC BLOCK, NEEDS ANESTHESIA CONSULT   CESAREAN SECTION  04/12/1985   COLONOSCOPY WITH PROPOFOL  N/A 06/13/2016   Procedure: COLONOSCOPY WITH PROPOFOL ;  Surgeon: Nannette Babe, MD;  Location: [facility];  Service: Gastroenterology;  Laterality: N/A;   ERCP N/A 02/15/2017   Procedure: ENDOSCOPIC RETROGRADE CHOLANGIOPANCREATOGRAPHY (ERCP);  Surgeon: Janel Medford, MD;  Location: [facility];  Service: Endoscopy;  Laterality: N/A;   EYE SURGERY Left    INSERTION OF MESH N/A 03/19/2013   Procedure: INSERTION OF MESH;  Surgeon: Evander Hills, DO;  Location: [facility];  Service: General;  Laterality: N/A;   IR IMAGING GUIDED PORT INSERTION  03/26/2020   IR THORACENTESIS ASP PLEURAL SPACE W/IMG GUIDE  05/25/2020   VENTRAL HERNIA REPAIR N/A 03/19/2013   Procedure:  OPEN VENTRAL HERNIA REPAIR WITH MESH AND APPLICATION OF WOUND VAC;  Surgeon: Evander Hills, DO;  Location: [facility];  Service: General;  Laterality: N/A;   VIDEO BRONCHOSCOPY Bilateral 04/19/2018   Procedure: VIDEO BRONCHOSCOPY WITH FLUORO;  Surgeon: Lind Repine, MD;  Location: [facility];  Service: Cardiopulmonary;  Laterality: Bilateral;    REVIEW OF SYSTEMS:  A comprehensive review of systems was negative except for: Constitutional: positive for fatigue and weight loss Respiratory: positive for cough and dyspnea on exertion   PHYSICAL EXAMINATION: General appearance: alert, cooperative, fatigued, and no distress Head: Normocephalic, without obvious abnormality, atraumatic Neck: no adenopathy, no JVD, supple, symmetrical, trachea midline, and thyroid  not enlarged, symmetric, no tenderness/mass/nodules Lymph nodes: Cervical, supraclavicular, and axillary nodes normal. Resp: clear to auscultation bilaterally Back: symmetric, no curvature. ROM normal. No CVA tenderness. Cardio: regular rate and rhythm, S1, S2 normal, no murmur, click, rub or gallop GI: soft, non-tender; bowel sounds normal; no masses,  no organomegaly Extremities: extremities normal, atraumatic, no cyanosis or edema  ECOG PERFORMANCE STATUS: 1 - Symptomatic but completely ambulatory  Blood pressure 104/70, pulse (!) 112, temperature 98.7 F (37.1 C), resp. rate 19, height 5\' 3"  (1.6 m), weight 126 lb 11.2 oz (57.5 kg), SpO2 97%.  LABORATORY DATA: Lab Results  Component Value Date   WBC 10.8 (H) 11/13/2023   HGB 11.7 (L) 11/13/2023   HCT 37.4 11/13/2023   MCV 80.3 11/13/2023   PLT 326 11/13/2023      Chemistry      Component Value Date/Time   NA 142 11/13/2023 0949   NA 143 11/23/2016 1504   K 3.8 11/13/2023 0949   CL 102 11/13/2023 0949   CO2 33 (H) 11/13/2023 0949   BUN 7 (L) 11/13/2023 0949   BUN 14 11/23/2016 1504   CREATININE 0.54 11/13/2023 0949      Component Value Date/Time   CALCIUM  9.2 11/13/2023 0949   ALKPHOS 67 11/13/2023 0949   AST 12 (L) 11/13/2023 0949   ALT 7 11/13/2023 0949   BILITOT 0.3 11/13/2023 0949       RADIOGRAPHIC STUDIES: No results found.   ASSESSMENT AND PLAN: This is a very pleasant 67 years old white female with  stage IIIB non-small cell lung cancer, squamous cell carcinoma. She underwent a course of concurrent chemoradiation with weekly carboplatin  and paclitaxel , status post 7 cycles.  She tolerated this treatment well with no concerning adverse effect except for fatigue. She underwent consolidation treatment with  immunotherapy with Imfinzi  status post 26 cycles. The patient was also diagnosed with a stage IIb left breast cancer s/p lumpectomy followed by adjuvant systemic chemotherapy with Adriamycin , Cytoxan  and Taxol .  She was followed by Dr. Gudena at that time.  She was recently found to have recurrence of her disease and she is started on treatment with Femara  2.5 mg p.o. daily by Dr. Gudena on 11/11/2021. She had repeat CT scan of the chest performed recently.  The scan report is still pending but I personally and independently reviewed the images and I did not see clear evidence for progression but I will wait for the final report for confirmation. Assessment and Plan    Recurrent breast cancer Recurrent breast cancer managed with Femara  (letrozole ) 2.5 mg daily without issues. - Continue Femara  2.5 mg PO daily - Follow up with Dr. Gudena in a couple of months  Non-small cell lung cancer, stage IIIb, squamous cell carcinoma Completed chemoradiation and immunotherapy with Imfinzi  in December 2021. Recent CT scan images showed no concerning findings, awaiting official report. Reports improved breathing and undesired weight loss. - Await official report of recent CT scan - If CT scan shows no concerning findings, follow up in 6 months - If CT scan shows concerning findings, contact her  Chronic obstructive pulmonary disease (COPD) COPD managed with supplemental oxygen . Uses 4 liters of oxygen  when walking long distances and 2 liters at home. Reports improved breathing with current management. - Continue current oxygen  therapy regimen - Follow up with Dr. Nemiah Banister for COPD management   The  patient was advised to call immediately if she has any concerning symptoms in the interval. The patient voices understanding of current disease status and treatment options and is in agreement with the current care plan.  All questions were answered. The patient knows to call the clinic with any problems, questions or concerns. We can certainly see the patient much sooner if necessary.  Disclaimer: This note was dictated with voice recognition software. Similar sounding words can inadvertently be transcribed and may not be corrected upon review.

## 2023-11-21 ENCOUNTER — Other Ambulatory Visit: Payer: Self-pay | Admitting: Radiology

## 2023-11-21 DIAGNOSIS — E1165 Type 2 diabetes mellitus with hyperglycemia: Secondary | ICD-10-CM

## 2023-11-21 LAB — MICROALBUMIN / CREATININE URINE RATIO
Creatinine,U: 102.2 mg/dL
Microalb Creat Ratio: 17.9 mg/g (ref 0.0–30.0)
Microalb, Ur: 1.8 mg/dL (ref 0.0–1.9)

## 2023-11-22 ENCOUNTER — Inpatient Hospital Stay

## 2023-11-28 ENCOUNTER — Other Ambulatory Visit: Payer: Self-pay | Admitting: Primary Care

## 2023-11-28 DIAGNOSIS — E1165 Type 2 diabetes mellitus with hyperglycemia: Secondary | ICD-10-CM

## 2023-12-21 ENCOUNTER — Ambulatory Visit
Admission: RE | Admit: 2023-12-21 | Discharge: 2023-12-21 | Disposition: A | Discharge status: Home / Self Care | Source: Ambulatory Visit | Attending: Hematology and Oncology | Admitting: Hematology and Oncology

## 2023-12-21 ENCOUNTER — Other Ambulatory Visit: Payer: Self-pay | Admitting: Hematology and Oncology

## 2023-12-21 ENCOUNTER — Ambulatory Visit
Admission: RE | Admit: 2023-12-21 | Discharge: 2023-12-21 | Disposition: A | Discharge status: Home / Self Care | Payer: BC Managed Care – PPO | Source: Ambulatory Visit | Attending: Hematology and Oncology | Admitting: Hematology and Oncology

## 2023-12-21 DIAGNOSIS — N6320 Unspecified lump in the left breast, unspecified quadrant: Secondary | ICD-10-CM

## 2023-12-21 DIAGNOSIS — Z853 Personal history of malignant neoplasm of breast: Secondary | ICD-10-CM

## 2023-12-21 DIAGNOSIS — R590 Localized enlarged lymph nodes: Secondary | ICD-10-CM | POA: Diagnosis not present

## 2023-12-21 DIAGNOSIS — N6321 Unspecified lump in the left breast, upper outer quadrant: Secondary | ICD-10-CM | POA: Diagnosis not present

## 2023-12-21 DIAGNOSIS — R599 Enlarged lymph nodes, unspecified: Secondary | ICD-10-CM

## 2023-12-24 ENCOUNTER — Ambulatory Visit
Admission: RE | Admit: 2023-12-24 | Discharge: 2023-12-24 | Disposition: A | Discharge status: Home / Self Care | Source: Ambulatory Visit | Attending: Hematology and Oncology

## 2023-12-24 ENCOUNTER — Ambulatory Visit
Admission: RE | Admit: 2023-12-24 | Discharge: 2023-12-24 | Disposition: A | Discharge status: Home / Self Care | Source: Ambulatory Visit | Attending: Hematology and Oncology | Admitting: Hematology and Oncology

## 2023-12-24 DIAGNOSIS — N6320 Unspecified lump in the left breast, unspecified quadrant: Secondary | ICD-10-CM

## 2023-12-24 DIAGNOSIS — R599 Enlarged lymph nodes, unspecified: Secondary | ICD-10-CM

## 2023-12-24 DIAGNOSIS — N6321 Unspecified lump in the left breast, upper outer quadrant: Secondary | ICD-10-CM | POA: Diagnosis not present

## 2023-12-24 HISTORY — PX: BREAST BIOPSY: SHX20

## 2023-12-25 LAB — SURGICAL PATHOLOGY

## 2023-12-28 ENCOUNTER — Ambulatory Visit: Payer: Self-pay | Admitting: Surgery

## 2023-12-28 DIAGNOSIS — C50912 Malignant neoplasm of unspecified site of left female breast: Secondary | ICD-10-CM

## 2023-12-28 DIAGNOSIS — C773 Secondary and unspecified malignant neoplasm of axilla and upper limb lymph nodes: Secondary | ICD-10-CM | POA: Diagnosis not present

## 2023-12-28 DIAGNOSIS — C50412 Malignant neoplasm of upper-outer quadrant of left female breast: Secondary | ICD-10-CM | POA: Diagnosis not present

## 2023-12-31 ENCOUNTER — Inpatient Hospital Stay: Payer: BC Managed Care – PPO | Attending: Internal Medicine | Admitting: Hematology and Oncology

## 2023-12-31 ENCOUNTER — Telehealth: Payer: Self-pay | Admitting: *Deleted

## 2023-12-31 ENCOUNTER — Encounter: Payer: Self-pay | Admitting: Pulmonary Disease

## 2023-12-31 VITALS — BP 97/57 | HR 106 | Temp 98.1°F | Resp 18 | Ht 63.0 in | Wt 122.5 lb

## 2023-12-31 DIAGNOSIS — Z171 Estrogen receptor negative status [ER-]: Secondary | ICD-10-CM | POA: Insufficient documentation

## 2023-12-31 DIAGNOSIS — C50412 Malignant neoplasm of upper-outer quadrant of left female breast: Secondary | ICD-10-CM | POA: Insufficient documentation

## 2023-12-31 DIAGNOSIS — L539 Erythematous condition, unspecified: Secondary | ICD-10-CM | POA: Diagnosis not present

## 2023-12-31 DIAGNOSIS — C773 Secondary and unspecified malignant neoplasm of axilla and upper limb lymph nodes: Secondary | ICD-10-CM | POA: Insufficient documentation

## 2023-12-31 DIAGNOSIS — Z79811 Long term (current) use of aromatase inhibitors: Secondary | ICD-10-CM | POA: Insufficient documentation

## 2023-12-31 NOTE — Telephone Encounter (Signed)
 Fax received from Dr. Sim Dryer with [facility] to perform a left breast seed lumpectomy surgery in the near future on patient.  Patient needs surgery clearance. Surgery is pending. Patient was seen on 09/27/23. Office protocol is a risk assessment can be sent to surgeon if patient has been seen in 60 days or less.   Sending to Dr. Marygrace Snellen for risk assessment or recommendations if patient needs to be seen in office prior to surgical procedure.

## 2023-12-31 NOTE — Assessment & Plan Note (Signed)
 04/08/20: Left lumpectomy [place]): three foci of IDC, grade 3, 3.3cm, 1.0cm, 0.7cm, with high grade DCIS, clear margins, 2 left axillary lymph nodes negative for carcinoma. HER-2 negative (0), ER/PR negative, Ki67 40%.     Treatment summary: 1. Adjuvant chemotherapy with Adriamycin  and Cytoxan  dose dense 4 followed by Taxol  weekly 12 (discontinued after cycle 1 because of profound fatigue and generalized failure to thrive along with hypoxia and tachycardia) the first cycle was even given at a low dose 2. Followed by adjuvant radiation therapy (patient does not want to receive radiation) 3.  Relapse:08/16/2022:Left lumpectomy: Grade 3 poorly differentiated carcinoma 2.4 cm with DCIS, margins negative, lymphovascular invasion not identified, ER 30%, PR 0%, HER2 1+ negative, Ki-67 20%  4.  Current treatment: Anastrozole  started 12/08/2022  Anastrozole  toxicities:  Breast cancer surveillance: Breast exam 12/31/2023: Benign Mammogram 12/21/2023: Indeterminate left breast mass 1.1 cm: Ultrasound biopsy: Benign   Severe COPD: 24-hour oxygen  Lung cancer treatment and surveillance with Dr. Marguerita Shih Return to clinic in 1 year for follow-up

## 2023-12-31 NOTE — Progress Notes (Addendum)
 Patient Care Team: [name], NP as PCP - General (Internal Medicine) Wenona Hamilton, MD as PCP - Cardiology (Cardiology) Wenona Hamilton, MD as Consulting Physician (Cardiology) Sim Dryer, MD as Consulting Physician (General Surgery) Cameron Cea, MD as Consulting Physician (Hematology and Oncology)  DIAGNOSIS:  Encounter Diagnoses  Name Primary?   Malignant neoplasm of upper-outer quadrant of left breast in female, estrogen receptor negative (HCC) Yes   Facial erythema     SUMMARY OF ONCOLOGIC HISTORY: Oncology History  Primary malignant neoplasm of bronchus of left lower lobe (HCC)  05/02/2018 Initial Diagnosis   Stage III squamous cell carcinoma of left lung (HCC)   05/13/2018 - 06/24/2018 Chemotherapy   The patient had palonosetron  (ALOXI ) injection 0.25 mg, 0.25 mg, Intravenous,  Once, 7 of 7 cycles Administration: 0.25 mg (05/13/2018), 0.25 mg (06/10/2018), 0.25 mg (06/17/2018), 0.25 mg (05/20/2018), 0.25 mg (06/24/2018), 0.25 mg (05/27/2018), 0.25 mg (06/03/2018) CARBOplatin  (PARAPLATIN ) 300 mg in sodium chloride  0.9 % 250 mL chemo infusion, 300 mg (100 % of original dose 300 mg), Intravenous,  Once, 7 of 7 cycles Dose modification: 300 mg (original dose 300 mg, Cycle 1) Administration: 300 mg (05/13/2018), 300 mg (06/10/2018), 260 mg (06/17/2018), 300 mg (05/20/2018), 300 mg (06/24/2018), 300 mg (05/27/2018), 300 mg (06/03/2018) PACLitaxel  (TAXOL ) 96 mg in sodium chloride  0.9 % 250 mL chemo infusion (</= 80mg /m2), 45 mg/m2 = 96 mg, Intravenous,  Once, 7 of 7 cycles Administration: 96 mg (05/13/2018), 96 mg (06/10/2018), 96 mg (06/17/2018), 96 mg (05/20/2018), 96 mg (06/24/2018), 96 mg (05/27/2018), 96 mg (06/03/2018)  for chemotherapy treatment.    07/31/2018 - 07/15/2019 Chemotherapy   The patient had durvalumab  (IMFINZI ) 1,000 mg in sodium chloride  0.9 % 100 mL chemo infusion, 1,020 mg, Intravenous,  Once, 26 of 26 cycles Administration: 1,000 mg (07/31/2018),  1,000 mg (10/22/2018), 1,000 mg (11/05/2018), 1,000 mg (11/19/2018), 1,000 mg (12/03/2018), 1,000 mg (08/14/2018), 1,000 mg (12/17/2018), 1,000 mg (08/27/2018), 1,000 mg (09/10/2018), 1,000 mg (09/24/2018), 1,000 mg (10/11/2018), 1,000 mg (12/31/2018), 1,000 mg (01/14/2019), 1,120 mg (01/28/2019), 1,120 mg (02/11/2019), 1,120 mg (02/25/2019), 1,120 mg (03/11/2019), 1,120 mg (03/25/2019), 1,120 mg (04/08/2019), 1,120 mg (04/22/2019), 1,120 mg (05/06/2019), 1,120 mg (05/20/2019), 1,120 mg (06/03/2019), 1,120 mg (06/17/2019), 1,120 mg (07/01/2019), 1,120 mg (07/15/2019)  for chemotherapy treatment.    05/11/2020 - 05/13/2020 Chemotherapy   Received 1 cycle of Adriamycin  and Cytoxan  and discontinued it   11/17/2021 Cancer Staging   Staging form: Lung, AJCC 8th Edition - Clinical: Stage IIIB (cT3, cN2, cM0) - Signed by Marlene Simas, MD on 11/17/2021   Malignant neoplasm of upper-outer quadrant of left breast in female, estrogen receptor negative (HCC)  02/19/2020 Cancer Staging   Staging form: Breast, AJCC 8th Edition - Clinical stage from 02/19/2020: Stage IIB (cT2, cN0, cM0, G3, ER-, PR-, HER2-) - Signed by Percival Brace, NP on 02/25/2020   02/19/2020 Initial Diagnosis   Chest CT for lung cancer follow-up showed a left breast module. Mammogram and US  showed a 3.7cm mass with calcifications at the 2 o'clock position in the left breast, no left axillary adenopathy. Biopsy showed IDC, grade 3, HER-2 negative (0), ER/PR negative, Ki67 40%.   04/08/2020 Surgery   Left lumpectomy [place]): three foci of IDC, grade 3, 3.3cm, 1.0cm, 0.7cm, with high grade DCIS, clear margins, 2 left axillary lymph nodes negative for carcinoma.    05/11/2020 - 05/13/2020 Chemotherapy   Received 1 cycle of Adriamycin  and Cytoxan  and discontinued it   10/18/2021 Relapse/Recurrence   Mammogram  detected indeterminate left breast mass at 12 o'clock position measuring 6 mm, 2 focal areas of calcifications (H 8 mm) within the left breast favored to  be fat necrosis. The 6 mm mass on biopsy came back as grade 3 IDC ER 30% weak, PR 0%, HER2 negative, Ki-67 20%   08/16/2022 Surgery   Left lumpectomy: Grade 3 poorly differentiated carcinoma 2.4 cm with DCIS, margins negative, lymphovascular invasion not identified, ER 30%, PR 0%, HER2 1+ negative, Ki-67 20%   08/16/2022 Relapse/Recurrence    Left lumpectomy: Grade 3 poorly differentiated carcinoma 2.4 cm with DCIS, margins negative, lymphovascular invasion not identified, ER 30%, PR 0%, HER2 1+ negative, Ki-67 20%   12/21/2023 Relapse/Recurrence    Left axillary lymph node recurrence: Biopsy: Positive for metastatic breast cancer ER 45% weak, PR 0%, Ki67 50%, HER2 0 Left breast biopsy: Benign     CHIEF COMPLIANT: Recurrence of breast cancer  HISTORY OF PRESENT ILLNESS:   History of Present Illness Deborah [name] is a 68 year old female with breast cancer who presents with a new lymph node metastasis.  A recent mammogram and biopsy confirmed cancer in the lymph node under her left arm, with the breast tissue being benign. The lymph node is weakly estrogen receptor positive, progesterone receptor negative, with a growth rate of fifty percent. An earlier ultrasound noted something suspicious, but the lymph node was confirmed by the recent mammogram and ultrasound. A CT scan of the chest did not show any cancer spread.  Her medical history includes lung cancer diagnosed in 2019 and aggressive breast cancer in 2021. She underwent surgery and a lumpectomy on the left side in January 2024. She is currently taking anastrozole  daily, though not consistently at the same time each day.     ALLERGIES:  has no known allergies.  MEDICATIONS:  Current Outpatient Medications  Medication Sig Dispense Refill   acetaminophen  (TYLENOL ) 325 MG tablet Take 650 mg by mouth every 6 (six) hours as needed for headache (pain).     albuterol  (VENTOLIN  HFA) 108 (90 Base) MCG/ACT inhaler Inhale 2 puffs into the  lungs every 6 (six) hours as needed for wheezing or shortness of breath. 8 g 3   anastrozole  (ARIMIDEX ) 1 MG tablet TAKE 1 TABLET BY MOUTH EVERY DAY 90 tablet 3   aspirin  81 MG chewable tablet Chew 1 tablet (81 mg total) by mouth daily. 30 tablet 1   Blood Glucose Monitoring Suppl DEVI 1 each by Does not apply route in the morning, at noon, and at bedtime. May substitute to any manufacturer covered by patient's insurance. 1 each 0   budeson-glycopyrrolate -formoterol  (BREZTRI  AEROSPHERE) 160-9-4.8 MCG/ACT AERO Inhale 2 puffs into the lungs 2 (two) times daily. 1 each 12   Cholecalciferol (VITAMIN D3) 125 MCG (5000 UT) TABS Take 5,000 Units by mouth daily.     CONTOUR NEXT TEST test strip USE AS DIRECTED IN THE MORNING, AT NOON, AND AT BEDTIME TO CHECK BLOOD GLUCOSE LEVLES 300 strip 1   diltiazem  (CARDIZEM  CD) 240 MG 24 hr capsule TAKE 1 CAPSULE BY MOUTH EVERY DAY 90 capsule 2   erythromycin ophthalmic ointment 1 Application.     furosemide  (LASIX ) 20 MG tablet TAKE 1 AND 1/2 TABLETS DAILY BY MOUTH. 135 tablet 2   ipratropium-albuterol  (DUONEB) 0.5-2.5 (3) MG/3ML SOLN Take 3 mLs by nebulization every 4 (four) hours as needed. 360 mL 11   montelukast  (SINGULAIR ) 10 MG tablet TAKE 1 TABLET (10 MG TOTAL) BY MOUTH AT BEDTIME. FOR  ALLERGIES 90 tablet 2   OXYGEN  Inhale 4 L/min into the lungs continuous.     potassium chloride  (KLOR-CON  M10) 10 MEQ tablet TAKE 1 TABLET BY MOUTH EVERY DAY 90 tablet 2   rosuvastatin  (CRESTOR ) 5 MG tablet TAKE 1 TABLET BY MOUTH EVERY DAY FOR CHOLESTEROL 90 tablet 2   Semaglutide , 1 MG/DOSE, (OZEMPIC , 1 MG/DOSE,) 4 MG/3ML SOPN INJECT 1 MG INTO THE SKIN ONCE A WEEK. FOR DIABETES. 9 mL 0   Spacer/Aero-Holding Chambers (AEROCHAMBER MV) inhaler Use as instructed 1 each 0   lidocaine -prilocaine  (EMLA ) cream APPLY TO AFFECTED AREA ONCE AS DIRECTED 30 g 3   No current facility-administered medications for this visit.    PHYSICAL EXAMINATION: ECOG PERFORMANCE STATUS: 1 -  Symptomatic but completely ambulatory   Vitals:   12/31/23 1457  BP: (!) 97/57  Pulse: (!) 106  Resp: 18  Temp: 98.1 F (36.7 C)  SpO2: 97%   Filed Weights   12/31/23 1457  Weight: 122 lb 8 oz (55.6 kg)    Physical Exam   (exam performed in the presence of a chaperone)  LABORATORY DATA:  I have reviewed the data as listed    Latest Ref Rng & Units 11/13/2023    9:49 AM 05/21/2023   11:46 AM 11/17/2022    9:56 AM  CMP  Glucose 70 - 99 mg/dL 98  161  84   BUN 8 - 23 mg/dL 7  14  14    Creatinine 0.44 - 1.00 mg/dL 0.96  0.45  4.09   Sodium 135 - 145 mmol/L 142  138  140   Potassium 3.5 - 5.1 mmol/L 3.8  3.9  3.9   Chloride 98 - 111 mmol/L 102  99  102   CO2 22 - 32 mmol/L 33  34  32   Calcium  8.9 - 10.3 mg/dL 9.2  9.4  9.4   Total Protein 6.5 - 8.1 g/dL 6.9  7.9  7.5   Total Bilirubin 0.0 - 1.2 mg/dL 0.3  0.3  0.2   Alkaline Phos 38 - 126 U/L 67  80  66   AST 15 - 41 U/L 12  12  11    ALT 0 - 44 U/L 7  9  10      Lab Results  Component Value Date   WBC 10.8 (H) 11/13/2023   HGB 11.7 (L) 11/13/2023   HCT 37.4 11/13/2023   MCV 80.3 11/13/2023   PLT 326 11/13/2023   NEUTROABS 8.2 (H) 11/13/2023    ASSESSMENT & PLAN:  Malignant neoplasm of upper-outer quadrant of left breast in female, estrogen receptor negative (HCC) 04/08/20: Left lumpectomy [place]): three foci of IDC, grade 3, 3.3cm, 1.0cm, 0.7cm, with high grade DCIS, clear margins, 2 left axillary lymph nodes negative for carcinoma. HER-2 negative (0), ER/PR negative, Ki67 40%.     Treatment summary: 1. Adjuvant chemotherapy with Adriamycin  and Cytoxan  dose dense 4 followed by Taxol  weekly 12 (discontinued after cycle 1 because of profound fatigue and generalized failure to thrive along with hypoxia and tachycardia) the first cycle was even given at a low dose 2. Followed by adjuvant radiation therapy (patient does not want to receive radiation) 3.  Relapse:08/16/2022:Left lumpectomy: Grade 3 poorly differentiated  carcinoma 2.4 cm with DCIS, margins negative, lymphovascular invasion not identified, ER 30%, PR 0%, HER2 1+ negative, Ki-67 20%  4.  Current treatment: Anastrozole  started 12/08/2022 5.  12/21/2023: Left axillary lymph node recurrence  Anastrozole  toxicities:  Breast cancer surveillance: Breast exam 12/31/2023:  Benign Mammogram 12/21/2023: Indeterminate left breast mass 1.1 cm: Ultrasound breast biopsy: Benign, lymph node biopsy: Positive for breast cancer ER 40% weak, PR 0%, HER2 negative, Ki67 50%   Recommendation: ALND with left lumpectomy followed by continued antiestrogen therapy I do not plan to obtain CT scans because recently she had a CT chest which was negative.  Severe COPD: 24-hour oxygen  Lung cancer treatment and surveillance with Dr. Marguerita Shih   Profound redness of the face and chest: Sent for dermatology referral ------------------------------------- Assessment and Plan Assessment & Plan Malignant neoplasm of lymph nodes of left axilla Lymph node consistent with breast cancer, weakly ER positive, PR negative, 50% growth rate, 1.1 cm. More aggressive than previous, no metastasis on imaging. - Proceed with left axillary lymph node dissection. - Consider lumpectomy due to discordant biopsy results. - No additional radiation therapy planned. - Continue anastrozole  1 mg oral daily.  Malignant neoplasm of left breast Aggressive breast cancer treated with surgery and lumpectomy in January 2024. Current imaging shows no malignancy.  Lung cancer Lung cancer diagnosed in 2019. Recent CT shows no recurrence.  Skin rash with erythema and scaling Erythematous, scaly rash on face and neck, possibly immunological. No prior dermatological evaluation. - Refer to dermatologist for evaluation. - Document and photograph rash for records.  Goals of Care She expressed desire to continue cancer treatment and maintain quality of life, optimistic about prognosis.      Orders Placed This  Encounter  Procedures   Ambulatory referral to Dermatology    Referral Priority:   Urgent    Referral Type:   Consultation    Referral Reason:   Specialty Services Required    Referred to Provider:   Avis Boehringer, MD    Requested Specialty:   Dermatology    Number of Visits Requested:   1   The patient has a good understanding of the overall plan. she agrees with it. she will call with any problems that may develop before the next visit here. Total time spent: 30 mins including face to face time and time spent for planning, charting and co-ordination of care   Margert Sheerer, MD 12/31/23

## 2023-12-31 NOTE — Telephone Encounter (Signed)
 Preoperative evaluation: Pulmonary medicine does not provide preoperative clearance or other preoperative risk assessment.  Based on the ARISCAT model patient is low at 1.6% risk of postoperative pulmonary complication assuming duration of surgery is less than 3 hours.  If duration of surgery is greater than 3 hours patient is intermediate to 13.3% risk of postoperative pulmonary complication.  I suspect this risk is underestimated given her severe underlying COPD and chronic hypoxemic respiratory failure.  There are no modifiable risk factors to address prior to surgery. --Please provide DuoNebs in the preoperative area in the PACU --Recommend checking intraoperative arterial blood gas --Recommend extubating to BiPAP --Recommend surgery be done in acute inpatient hospital --Recommend patient be admitted to stepdown at least for overnight stay postoperatively to evaluate for respiratory  decompensation

## 2024-01-01 NOTE — Telephone Encounter (Signed)
 This was already addressed yesterday and faxed per Leslie's note. Thanks.

## 2024-01-01 NOTE — Telephone Encounter (Signed)
**Note De-identified  [place] Obfuscation** Please advise 

## 2024-01-01 NOTE — Telephone Encounter (Signed)
 Copy of this risk assessment faxed to [facility]

## 2024-01-09 ENCOUNTER — Other Ambulatory Visit: Payer: Self-pay | Admitting: Surgery

## 2024-01-09 DIAGNOSIS — C50912 Malignant neoplasm of unspecified site of left female breast: Secondary | ICD-10-CM

## 2024-01-10 ENCOUNTER — Other Ambulatory Visit: Payer: Self-pay | Admitting: Primary Care

## 2024-01-10 DIAGNOSIS — I5032 Chronic diastolic (congestive) heart failure: Secondary | ICD-10-CM

## 2024-01-10 DIAGNOSIS — E785 Hyperlipidemia, unspecified: Secondary | ICD-10-CM

## 2024-01-10 DIAGNOSIS — I700 Atherosclerosis of aorta: Secondary | ICD-10-CM

## 2024-01-10 DIAGNOSIS — J984 Other disorders of lung: Secondary | ICD-10-CM

## 2024-01-15 NOTE — Pre-Procedure Instructions (Signed)
 Surgical Instructions   Your procedure is scheduled on January 23, 2024. Report to [facility] Main Entrance A at 11:00 A.M., then check in with the Admitting office. Any questions or running late day of surgery: call [phone]  Questions prior to your surgery date: call [phone], Monday-Friday, 8am-4pm. If you experience any cold or flu symptoms such as cough, fever, chills, shortness of breath, etc. between now and your scheduled surgery, please notify us  at the above number.     Remember:  Do not eat after midnight the night before your surgery   You may drink clear liquids until 10:00 AM the morning of your surgery.   Clear liquids allowed are: Water, Non-Citrus Juices (without pulp), Carbonated Beverages, Clear Tea (no milk, honey, etc.), Black Coffee Only (NO MILK, CREAM OR POWDERED CREAMER of any kind), and Gatorade.    Take these medicines the morning of surgery with A SIP OF WATER: anastrozole  (ARIMIDEX )  budeson-glycopyrrolate -formoterol  (BREZTRI  AEROSPHERE)  diltiazem  (CARDIZEM  CD)  Polyethyl Glycol-Propyl Glycol (SYSTANE ULTRA) eye drops rosuvastatin  (CRESTOR )    May take these medicines IF NEEDED: acetaminophen  (TYLENOL )  albuterol  (VENTOLIN  HFA) inhaler - please bring inhaler with you morning of surgery ipratropium-albuterol  (DUONEB) nebulizer   Follow your surgeon's instructions on when to stop Aspirin .  If no instructions were given by your surgeon then you will need to call the office to get those instructions.     One week prior to surgery, STOP taking any Aleve, Naproxen, Ibuprofen, Motrin, Advil, Goody's, BC's, all herbal medications, fish oil, and non-prescription vitamins.   WHAT DO I DO ABOUT MY DIABETES MEDICATION?   STOP taking your Semaglutide  (OZEMPIC ) one week prior to surgery. DO NOT take any doses after July 1st.      HOW TO MANAGE YOUR DIABETES BEFORE AND AFTER SURGERY  Why is it important to control my blood sugar before and after  surgery? Improving blood sugar levels before and after surgery helps healing and can limit problems. A way of improving blood sugar control is eating a healthy diet by:  Eating less sugar and carbohydrates  Increasing activity/exercise  Talking with your doctor about reaching your blood sugar goals High blood sugars (greater than 180 mg/dL) can raise your risk of infections and slow your recovery, so you will need to focus on controlling your diabetes during the weeks before surgery. Make sure that the doctor who takes care of your diabetes knows about your planned surgery including the date and location.  How do I manage my blood sugar before surgery? Check your blood sugar at least 4 times a day, starting 2 days before surgery, to make sure that the level is not too high or low.  Check your blood sugar the morning of your surgery when you wake up and every 2 hours until you get to the Short Stay unit.  If your blood sugar is less than 70 mg/dL, you will need to treat for low blood sugar: Do not take insulin . Treat a low blood sugar (less than 70 mg/dL) with  cup of clear juice (cranberry or apple), 4 glucose tablets, OR glucose gel. Recheck blood sugar in 15 minutes after treatment (to make sure it is greater than 70 mg/dL). If your blood sugar is not greater than 70 mg/dL on recheck, call [phone] for further instructions. Report your blood sugar to the short stay nurse when you get to Short Stay.  If you are admitted to the hospital after surgery: Your blood sugar will be checked  by the staff and you will probably be given insulin  after surgery (instead of oral diabetes medicines) to make sure you have good blood sugar levels. The goal for blood sugar control after surgery is 80-180 mg/dL.                      Do NOT Smoke (Tobacco/Vaping) for 24 hours prior to your procedure.  If you use a CPAP at night, you may bring your mask/headgear for your overnight stay.   You will be  asked to remove any contacts, glasses, piercing's, hearing aid's, dentures/partials prior to surgery. Please bring cases for these items if needed.    Patients discharged the day of surgery will not be allowed to drive home, and someone needs to stay with them for 24 hours.  SURGICAL WAITING ROOM VISITATION Patients may have no more than 2 support people in the waiting area - these visitors may rotate.   Pre-op nurse will coordinate an appropriate time for 1 ADULT support person, who may not rotate, to accompany patient in pre-op.  Children under the age of 4 must have an adult with them who is not the patient and must remain in the main waiting area with an adult.  If the patient needs to stay at the hospital during part of their recovery, the visitor guidelines for inpatient rooms apply.  Please refer to the [facility] website for the visitor guidelines for any additional information.   If you received a COVID test during your pre-op visit  it is requested that you wear a mask when out in public, stay away from anyone that may not be feeling well and notify your surgeon if you develop symptoms. If you have been in contact with anyone that has tested positive in the last 10 days please notify you surgeon.      Pre-operative CHG Bathing Instructions   You can play a key role in reducing the risk of infection after surgery. Your skin needs to be as free of germs as possible. You can reduce the number of germs on your skin by washing with CHG (chlorhexidine  gluconate) soap before surgery. CHG is an antiseptic soap that kills germs and continues to kill germs even after washing.   DO NOT use if you have an allergy to chlorhexidine /CHG or antibacterial soaps. If your skin becomes reddened or irritated, stop using the CHG and notify one of our RNs at [phone].              TAKE A SHOWER THE NIGHT BEFORE SURGERY AND THE DAY OF SURGERY    Please keep in mind the following:  DO NOT shave,  including legs and underarms, 48 hours prior to surgery.   You may shave your face before/day of surgery.  Place clean sheets on your bed the night before surgery Use a clean washcloth (not used since being washed) for each shower. DO NOT sleep with pet's night before surgery.  CHG Shower Instructions:  Wash your face and private area with normal soap. If you choose to wash your hair, wash first with your normal shampoo.  After you use shampoo/soap, rinse your hair and body thoroughly to remove shampoo/soap residue.  Turn the water OFF and apply half the bottle of CHG soap to a CLEAN washcloth.  Apply CHG soap ONLY FROM YOUR NECK DOWN TO YOUR TOES (washing for 3-5 minutes)  DO NOT use CHG soap on face, private areas, open wounds, or sores.  Pay special attention to the area where your surgery is being performed.  If you are having back surgery, having someone wash your back for you may be helpful. Wait 2 minutes after CHG soap is applied, then you may rinse off the CHG soap.  Pat dry with a clean towel  Put on clean pajamas    Additional instructions for the day of surgery: DO NOT APPLY any lotions, deodorants, cologne, or perfumes.   Do not wear jewelry or makeup Do not wear nail polish, gel polish, artificial nails, or any other type of covering on natural nails (fingers and toes) Do not bring valuables to the hospital. [facility] is not responsible for valuables/personal belongings. Put on clean/comfortable clothes.  Please brush your teeth.  Ask your nurse before applying any prescription medications to the skin.

## 2024-01-16 ENCOUNTER — Encounter (HOSPITAL_COMMUNITY)
Admission: RE | Admit: 2024-01-16 | Discharge: 2024-01-16 | Disposition: A | Discharge status: Home / Self Care | Source: Ambulatory Visit | Attending: Surgery | Admitting: Surgery

## 2024-01-16 ENCOUNTER — Other Ambulatory Visit: Payer: Self-pay

## 2024-01-16 ENCOUNTER — Encounter (HOSPITAL_COMMUNITY): Payer: Self-pay

## 2024-01-16 ENCOUNTER — Inpatient Hospital Stay: Attending: Internal Medicine

## 2024-01-16 VITALS — BP 99/73 | HR 111 | Temp 98.2°F | Resp 17 | Ht 63.0 in | Wt 122.0 lb

## 2024-01-16 DIAGNOSIS — I5032 Chronic diastolic (congestive) heart failure: Secondary | ICD-10-CM | POA: Diagnosis not present

## 2024-01-16 DIAGNOSIS — E1165 Type 2 diabetes mellitus with hyperglycemia: Secondary | ICD-10-CM | POA: Diagnosis not present

## 2024-01-16 DIAGNOSIS — Z9221 Personal history of antineoplastic chemotherapy: Secondary | ICD-10-CM | POA: Diagnosis not present

## 2024-01-16 DIAGNOSIS — C50412 Malignant neoplasm of upper-outer quadrant of left female breast: Secondary | ICD-10-CM | POA: Insufficient documentation

## 2024-01-16 DIAGNOSIS — J9611 Chronic respiratory failure with hypoxia: Secondary | ICD-10-CM | POA: Insufficient documentation

## 2024-01-16 DIAGNOSIS — Z87891 Personal history of nicotine dependence: Secondary | ICD-10-CM | POA: Diagnosis not present

## 2024-01-16 DIAGNOSIS — Z452 Encounter for adjustment and management of vascular access device: Secondary | ICD-10-CM | POA: Diagnosis not present

## 2024-01-16 DIAGNOSIS — Z95828 Presence of other vascular implants and grafts: Secondary | ICD-10-CM

## 2024-01-16 DIAGNOSIS — Z171 Estrogen receptor negative status [ER-]: Secondary | ICD-10-CM | POA: Insufficient documentation

## 2024-01-16 DIAGNOSIS — J4489 Other specified chronic obstructive pulmonary disease: Secondary | ICD-10-CM | POA: Insufficient documentation

## 2024-01-16 DIAGNOSIS — I340 Nonrheumatic mitral (valve) insufficiency: Secondary | ICD-10-CM | POA: Diagnosis not present

## 2024-01-16 DIAGNOSIS — J90 Pleural effusion, not elsewhere classified: Secondary | ICD-10-CM | POA: Insufficient documentation

## 2024-01-16 DIAGNOSIS — Z01812 Encounter for preprocedural laboratory examination: Secondary | ICD-10-CM | POA: Diagnosis not present

## 2024-01-16 DIAGNOSIS — Z853 Personal history of malignant neoplasm of breast: Secondary | ICD-10-CM | POA: Diagnosis not present

## 2024-01-16 DIAGNOSIS — I110 Hypertensive heart disease with heart failure: Secondary | ICD-10-CM | POA: Insufficient documentation

## 2024-01-16 DIAGNOSIS — D649 Anemia, unspecified: Secondary | ICD-10-CM | POA: Insufficient documentation

## 2024-01-16 DIAGNOSIS — J479 Bronchiectasis, uncomplicated: Secondary | ICD-10-CM | POA: Diagnosis not present

## 2024-01-16 DIAGNOSIS — Z01818 Encounter for other preprocedural examination: Secondary | ICD-10-CM

## 2024-01-16 DIAGNOSIS — G4733 Obstructive sleep apnea (adult) (pediatric): Secondary | ICD-10-CM | POA: Diagnosis not present

## 2024-01-16 DIAGNOSIS — Z9981 Dependence on supplemental oxygen: Secondary | ICD-10-CM | POA: Diagnosis not present

## 2024-01-16 LAB — CBC
HCT: 38.1 % (ref 36.0–46.0)
Hemoglobin: 11.6 g/dL — ABNORMAL LOW (ref 12.0–15.0)
MCH: 25.6 pg — ABNORMAL LOW (ref 26.0–34.0)
MCHC: 30.4 g/dL (ref 30.0–36.0)
MCV: 83.9 fL (ref 80.0–100.0)
Platelets: 346 10*3/uL (ref 150–400)
RBC: 4.54 MIL/uL (ref 3.87–5.11)
RDW: 16.4 % — ABNORMAL HIGH (ref 11.5–15.5)
WBC: 13.7 10*3/uL — ABNORMAL HIGH (ref 4.0–10.5)
nRBC: 0 % (ref 0.0–0.2)

## 2024-01-16 LAB — BASIC METABOLIC PANEL WITH GFR
Anion gap: 10 (ref 5–15)
BUN: 5 mg/dL — ABNORMAL LOW (ref 8–23)
CO2: 31 mmol/L (ref 22–32)
Calcium: 9.6 mg/dL (ref 8.9–10.3)
Chloride: 100 mmol/L (ref 98–111)
Creatinine, Ser: 0.58 mg/dL (ref 0.44–1.00)
GFR, Estimated: >60 mL/min (ref >60)
Glucose, Bld: 107 mg/dL — ABNORMAL HIGH (ref 70–99)
Potassium: 4.4 mmol/L (ref 3.5–5.1)
Sodium: 141 mmol/L (ref 135–145)

## 2024-01-16 LAB — GLUCOSE, CAPILLARY: Glucose-Capillary: 91 mg/dL (ref 70–99)

## 2024-01-16 LAB — HEMOGLOBIN A1C
Hgb A1c MFr Bld: 4.5 % — ABNORMAL LOW (ref 4.8–5.6)
Mean Plasma Glucose: 82.45 mg/dL

## 2024-01-16 MED ORDER — HEPARIN SOD (PORK) LOCK FLUSH 100 UNIT/ML IV SOLN
500.0000 [IU] | Freq: Once | INTRAVENOUS | Status: AC
  Administered 2024-01-16: 500 [IU]

## 2024-01-16 MED ORDER — SODIUM CHLORIDE 0.9% FLUSH
10.0000 mL | Freq: Once | INTRAVENOUS | Status: AC
Start: 2024-01-16 — End: 2024-01-16
  Administered 2024-01-16: 10 mL
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
## 2024-01-17 ENCOUNTER — Encounter

## 2024-01-17 ENCOUNTER — Inpatient Hospital Stay

## 2024-01-17 NOTE — Progress Notes (Signed)
 Anesthesia Chart Review:  68 year old female follows with pulmonology for history of former smoker with associated COPD, asthma, OSA on CPAP, chronic hypoxemic respiratory failure.  She uses 2 L supplemental O2 at rest and with sleep and 4 L with exertion.  She is maintained on Breztri  twice daily.  Last seen by Dr. Annella on 09/27/2023, stable at that time, no changes to management.  Dr. Annella commented on upcoming surgery and telephone encounter 12/31/2023, Preoperative evaluation: Pulmonary medicine does not provide preoperative clearance or other preoperative risk assessment.  Based on the ARISCAT model patient is low at 1.6% risk of postoperative pulmonary complication assuming duration of surgery is less than 3 hours.  If duration of surgery is greater than 3 hours patient is intermediate to 13.3% risk of postoperative pulmonary complication.  I suspect this risk is underestimated given her severe underlying COPD and chronic hypoxemic respiratory failure.  There are no modifiable risk factors to address prior to surgery. --Please provide DuoNebs in the preoperative area in the PACU --Recommend checking intraoperative arterial blood gas --Recommend extubating to BiPAP --Recommend surgery be done in acute inpatient hospital --Recommend patient be admitted to stepdown at least for overnight stay postoperatively to evaluate for respiratory  decompensation.  Follows with cardiology for history of HFpEF, HLD, HTN.  Most recent echo 04/2023 showed EF 55 to 60%, grade 1 DD, normal RV systolic function, mild mitral regurgitation.  Follows with oncology for history of left lower lobe lung cancer s/p chemotherapy, aggressive breast cancer s/p lumpectomy 2021 with recurrence and reexcision in 2024.  She has recently been found to have left axillary lymph node recurrence.  Other pertinent history includes PONV, non-insulin -dependent DM2.  Patient reports last dose of Ozempic  01/15/2024.  Preop labs reviewed,  mild anemia hemoglobin 11.6, otherwise unremarkable.  DM2 well-controlled with A1c 4.5.  EKG 06/01/2023: Sinus tachycardia.  Rate 105. Low voltage QRS  CT chest 11/16/2023: MPRESSION: 1. Stable left perihilar radiation fibrosis. No evidence of recurrent or metastatic disease in the chest. 2. Stable small left pleural effusion. 3. Worsening right lower lobe bronchiectasis with associated patchy mucoid impaction and patchy tree-in-bud opacity, favoring recurrent aspiration.  TTE 05/09/2023:  1. Left ventricular ejection fraction, by estimation, is 55 to 60%. The  left ventricle has normal function. The left ventricle has no regional  wall motion abnormalities. Left ventricular diastolic parameters are  consistent with Grade I diastolic  dysfunction (impaired relaxation).   2. Right ventricular systolic function is normal. The right ventricular  size is normal. Tricuspid regurgitation signal is inadequate for assessing  PA pressure.   3. The mitral valve is normal in structure. Mild mitral valve  regurgitation. No evidence of mitral stenosis.   4. The aortic valve has an indeterminant number of cusps. Aortic valve  regurgitation is not visualized. No aortic stenosis is present.   5. The inferior vena cava is normal in size with greater than 50%  respiratory variability, suggesting right atrial pressure of 3 mmHg.     Lynwood Geofm RIGGERS [facility]/Anesthesiology Phone [phone] 01/17/2024 3:06 PM

## 2024-01-17 NOTE — Anesthesia Preprocedure Evaluation (Addendum)
 Anesthesia Evaluation  Patient identified by MRN, date of birth, ID band Patient awake    Reviewed: Allergy & Precautions, NPO status , Patient's Chart, lab work & pertinent test results  History of Anesthesia Complications (+) PONV, Family history of anesthesia reaction and history of anesthetic complications  Airway Mallampati: II  TM Distance: >3 FB Neck ROM: Full    Dental  (+) Edentulous Upper, Edentulous Lower, Dental Advisory Given   Pulmonary neg shortness of breath, asthma , sleep apnea , COPD (2-3 LPM),  oxygen  dependent, neg recent URI, former smoker RLD   Pulmonary exam normal breath sounds clear to auscultation       Cardiovascular hypertension, (-) angina +CHF  (-) Past MI, (-) Cardiac Stents and (-) CABG  Rhythm:Regular Rate:Normal  TTE 04/19/2023: IMPRESSIONS    1. Left ventricular ejection fraction, by estimation, is 55 to 60%. The  left ventricle has normal function. The left ventricle has no regional  wall motion abnormalities. Left ventricular diastolic parameters are  consistent with Grade I diastolic  dysfunction (impaired relaxation).   2. Right ventricular systolic function is normal. The right ventricular  size is normal. Tricuspid regurgitation signal is inadequate for assessing  PA pressure.   3. The mitral valve is normal in structure. Mild mitral valve  regurgitation. No evidence of mitral stenosis.   4. The aortic valve has an indeterminant number of cusps. Aortic valve  regurgitation is not visualized. No aortic stenosis is present.   5. The inferior vena cava is normal in size with greater than 50%  respiratory variability, suggesting right atrial pressure of 3 mmHg.     Neuro/Psych  PSYCHIATRIC DISORDERS  Depression    negative neurological ROS     GI/Hepatic Neg liver ROS,neg GERD  ,,Diverticulosis    Endo/Other  diabetes, Type 2    Renal/GU negative Renal ROS      Musculoskeletal   Abdominal   Peds  Hematology  (+) Blood dyscrasia, anemia Lab Results      Component                Value               Date                      WBC                      13.7 (H)            01/16/2024                HGB                      11.6 (L)            01/16/2024                HCT                      38.1                01/16/2024                MCV                      83.9                01/16/2024  PLT                      346                 01/16/2024              Anesthesia Other Findings Last Ozempic : 01/02/2024  Reproductive/Obstetrics Breast cancer                              Anesthesia Physical Anesthesia Plan  ASA: 3  Anesthesia Plan: General   Post-op Pain Management: Regional block* and Tylenol  PO (pre-op)*   Induction: Intravenous  PONV Risk Score and Plan: 4 or greater and Ondansetron , Dexamethasone , Propofol  infusion, TIVA, Treatment may vary due to age or medical condition and Midazolam   Airway Management Planned: LMA  Additional Equipment:   Intra-op Plan:   Post-operative Plan: Extubation in OR  Informed Consent: I have reviewed the patients History and Physical, chart, labs and discussed the procedure including the risks, benefits and alternatives for the proposed anesthesia with the patient or authorized representative who has indicated his/her understanding and acceptance.     Dental advisory given  Plan Discussed with: CRNA and Anesthesiologist  Anesthesia Plan Comments: (Discussed potential risks of nerve blocks including, but not limited to, infection, bleeding, nerve damage, seizures, pneumothorax, respiratory depression, and potential failure of the block. Alternatives to nerve blocks discussed. All questions answered.  Risks of general anesthesia discussed including, but not limited to, sore throat, hoarse voice, chipped/damaged teeth, injury to vocal cords, nausea and  vomiting, allergic reactions, lung infection, heart attack, stroke, and death. All questions answered.   PAT note by Lynwood Hope, PA-C:  68 year old female follows with pulmonology for history of former smoker with associated COPD, asthma, OSA on CPAP, chronic hypoxemic respiratory failure.  She uses 2 L supplemental O2 at rest and with sleep and 4 L with exertion.  She is maintained on Breztri  twice daily.  Last seen by Dr. Annella on 09/27/2023, stable at that time, no changes to management.  Dr. Annella commented on upcoming surgery and telephone encounter 12/31/2023, Preoperative evaluation: Pulmonary medicine does not provide preoperative clearance or other preoperative risk assessment.  Based on the ARISCAT model patient is low at 1.6% risk of postoperative pulmonary complication assuming duration of surgery is less than 3 hours.  If duration of surgery is greater than 3 hours patient is intermediate to 13.3% risk of postoperative pulmonary complication.  I suspect this risk is underestimated given her severe underlying COPD and chronic hypoxemic respiratory failure.  There are no modifiable risk factors to address prior to surgery. --Please provide DuoNebs in the preoperative area in the PACU --Recommend checking intraoperative arterial blood gas --Recommend extubating to BiPAP --Recommend surgery be done in acute inpatient hospital --Recommend patient be admitted to stepdown at least for overnight stay postoperatively to evaluate for respiratory  decompensation.  Follows with cardiology for history of HFpEF, HLD, HTN.  Most recent echo 04/2023 showed EF 55 to 60%, grade 1 DD, normal RV systolic function, mild mitral regurgitation.  Follows with oncology for history of left lower lobe lung cancer s/p chemotherapy, aggressive breast cancer s/p lumpectomy 2021 with recurrence and reexcision in 2024.  She has recently been found to have left axillary lymph node recurrence.  Other pertinent history  includes PONV, non-insulin -dependent DM2.  Patient reports last dose of Ozempic  01/15/2024.  Preop labs reviewed, mild anemia hemoglobin  11.6, otherwise unremarkable.  DM2 well-controlled with A1c 4.5.  EKG 06/01/2023: Sinus tachycardia.  Rate 105. Low voltage QRS  CT chest 11/16/2023: MPRESSION: 1. Stable left perihilar radiation fibrosis. No evidence of recurrent or metastatic disease in the chest. 2. Stable small left pleural effusion. 3. Worsening right lower lobe bronchiectasis with associated patchy mucoid impaction and patchy tree-in-bud opacity, favoring recurrent aspiration.  TTE 05/09/2023: 1. Left ventricular ejection fraction, by estimation, is 55 to 60%. The  left ventricle has normal function. The left ventricle has no regional  wall motion abnormalities. Left ventricular diastolic parameters are  consistent with Grade I diastolic  dysfunction (impaired relaxation).  2. Right ventricular systolic function is normal. The right ventricular  size is normal. Tricuspid regurgitation signal is inadequate for assessing  PA pressure.  3. The mitral valve is normal in structure. Mild mitral valve  regurgitation. No evidence of mitral stenosis.  4. The aortic valve has an indeterminant number of cusps. Aortic valve  regurgitation is not visualized. No aortic stenosis is present.  5. The inferior vena cava is normal in size with greater than 50%  respiratory variability, suggesting right atrial pressure of 3 mmHg.    )         Anesthesia Quick Evaluation

## 2024-01-22 ENCOUNTER — Ambulatory Visit
Admission: RE | Admit: 2024-01-22 | Discharge: 2024-01-22 | Disposition: A | Discharge status: Home / Self Care | Source: Ambulatory Visit | Attending: Surgery | Admitting: Surgery

## 2024-01-22 ENCOUNTER — Encounter

## 2024-01-22 ENCOUNTER — Other Ambulatory Visit: Payer: Self-pay | Admitting: Surgery

## 2024-01-22 DIAGNOSIS — C50912 Malignant neoplasm of unspecified site of left female breast: Secondary | ICD-10-CM

## 2024-01-22 DIAGNOSIS — C773 Secondary and unspecified malignant neoplasm of axilla and upper limb lymph nodes: Secondary | ICD-10-CM | POA: Diagnosis not present

## 2024-01-22 HISTORY — PX: BREAST BIOPSY: SHX20

## 2024-01-23 ENCOUNTER — Ambulatory Visit
Admission: RE | Admit: 2024-01-23 | Discharge: 2024-01-23 | Disposition: A | Discharge status: Home / Self Care | Source: Ambulatory Visit | Attending: Surgery | Admitting: Surgery

## 2024-01-23 ENCOUNTER — Encounter (HOSPITAL_COMMUNITY)
Admission: RE | Disposition: A | Discharge status: Home / Self Care | Payer: Self-pay | Source: Home / Self Care | Attending: Surgery

## 2024-01-23 ENCOUNTER — Other Ambulatory Visit: Payer: Self-pay

## 2024-01-23 ENCOUNTER — Ambulatory Visit (HOSPITAL_COMMUNITY)
Admission: RE | Admit: 2024-01-23 | Discharge: 2024-01-23 | Disposition: A | Discharge status: Home / Self Care | Attending: Surgery | Admitting: Surgery

## 2024-01-23 ENCOUNTER — Ambulatory Visit (HOSPITAL_COMMUNITY): Payer: Self-pay | Admitting: Anesthesiology

## 2024-01-23 ENCOUNTER — Encounter (HOSPITAL_COMMUNITY): Payer: Self-pay | Admitting: Surgery

## 2024-01-23 ENCOUNTER — Ambulatory Visit (HOSPITAL_COMMUNITY): Payer: Self-pay | Admitting: Physician Assistant

## 2024-01-23 DIAGNOSIS — Z9981 Dependence on supplemental oxygen: Secondary | ICD-10-CM | POA: Insufficient documentation

## 2024-01-23 DIAGNOSIS — I110 Hypertensive heart disease with heart failure: Secondary | ICD-10-CM | POA: Diagnosis not present

## 2024-01-23 DIAGNOSIS — Z170 Estrogen receptor positive status [ER+]: Secondary | ICD-10-CM | POA: Insufficient documentation

## 2024-01-23 DIAGNOSIS — Z7984 Long term (current) use of oral hypoglycemic drugs: Secondary | ICD-10-CM | POA: Insufficient documentation

## 2024-01-23 DIAGNOSIS — C50912 Malignant neoplasm of unspecified site of left female breast: Secondary | ICD-10-CM | POA: Diagnosis present

## 2024-01-23 DIAGNOSIS — Z853 Personal history of malignant neoplasm of breast: Secondary | ICD-10-CM | POA: Insufficient documentation

## 2024-01-23 DIAGNOSIS — Z87891 Personal history of nicotine dependence: Secondary | ICD-10-CM | POA: Insufficient documentation

## 2024-01-23 DIAGNOSIS — N6082 Other benign mammary dysplasias of left breast: Secondary | ICD-10-CM | POA: Diagnosis not present

## 2024-01-23 DIAGNOSIS — G4730 Sleep apnea, unspecified: Secondary | ICD-10-CM | POA: Diagnosis not present

## 2024-01-23 DIAGNOSIS — Z923 Personal history of irradiation: Secondary | ICD-10-CM | POA: Insufficient documentation

## 2024-01-23 DIAGNOSIS — C50412 Malignant neoplasm of upper-outer quadrant of left female breast: Secondary | ICD-10-CM | POA: Insufficient documentation

## 2024-01-23 DIAGNOSIS — C773 Secondary and unspecified malignant neoplasm of axilla and upper limb lymph nodes: Secondary | ICD-10-CM | POA: Insufficient documentation

## 2024-01-23 DIAGNOSIS — I340 Nonrheumatic mitral (valve) insufficiency: Secondary | ICD-10-CM | POA: Insufficient documentation

## 2024-01-23 DIAGNOSIS — F32A Depression, unspecified: Secondary | ICD-10-CM | POA: Insufficient documentation

## 2024-01-23 DIAGNOSIS — Z1722 Progesterone receptor negative status: Secondary | ICD-10-CM | POA: Diagnosis not present

## 2024-01-23 DIAGNOSIS — N641 Fat necrosis of breast: Secondary | ICD-10-CM | POA: Insufficient documentation

## 2024-01-23 DIAGNOSIS — C3490 Malignant neoplasm of unspecified part of unspecified bronchus or lung: Secondary | ICD-10-CM | POA: Insufficient documentation

## 2024-01-23 DIAGNOSIS — Z79899 Other long term (current) drug therapy: Secondary | ICD-10-CM | POA: Diagnosis not present

## 2024-01-23 DIAGNOSIS — E119 Type 2 diabetes mellitus without complications: Secondary | ICD-10-CM | POA: Diagnosis not present

## 2024-01-23 DIAGNOSIS — Z1732 Human epidermal growth factor receptor 2 negative status: Secondary | ICD-10-CM | POA: Insufficient documentation

## 2024-01-23 DIAGNOSIS — K5790 Diverticulosis of intestine, part unspecified, without perforation or abscess without bleeding: Secondary | ICD-10-CM | POA: Diagnosis not present

## 2024-01-23 DIAGNOSIS — G8918 Other acute postprocedural pain: Secondary | ICD-10-CM | POA: Diagnosis not present

## 2024-01-23 DIAGNOSIS — E1165 Type 2 diabetes mellitus with hyperglycemia: Secondary | ICD-10-CM

## 2024-01-23 DIAGNOSIS — I5032 Chronic diastolic (congestive) heart failure: Secondary | ICD-10-CM | POA: Diagnosis not present

## 2024-01-23 DIAGNOSIS — D649 Anemia, unspecified: Secondary | ICD-10-CM | POA: Insufficient documentation

## 2024-01-23 DIAGNOSIS — J439 Emphysema, unspecified: Secondary | ICD-10-CM | POA: Insufficient documentation

## 2024-01-23 DIAGNOSIS — R921 Mammographic calcification found on diagnostic imaging of breast: Secondary | ICD-10-CM | POA: Diagnosis not present

## 2024-01-23 DIAGNOSIS — K219 Gastro-esophageal reflux disease without esophagitis: Secondary | ICD-10-CM | POA: Diagnosis not present

## 2024-01-23 DIAGNOSIS — R590 Localized enlarged lymph nodes: Secondary | ICD-10-CM | POA: Diagnosis not present

## 2024-01-23 DIAGNOSIS — N6012 Diffuse cystic mastopathy of left breast: Secondary | ICD-10-CM | POA: Diagnosis not present

## 2024-01-23 DIAGNOSIS — I509 Heart failure, unspecified: Secondary | ICD-10-CM | POA: Diagnosis not present

## 2024-01-23 HISTORY — PX: BREAST LUMPECTOMY WITH RADIOACTIVE SEED AND AXILLARY LYMPH NODE DISSECTION: SHX6656

## 2024-01-23 LAB — GLUCOSE, CAPILLARY
Glucose-Capillary: 126 mg/dL — ABNORMAL HIGH (ref 70–99)
Glucose-Capillary: 121 mg/dL — ABNORMAL HIGH (ref 70–99)

## 2024-01-23 SURGERY — BREAST LUMPECTOMY WITH RADIOACTIVE SEED AND AXILLARY LYMPH NODE DISSECTION
Anesthesia: General | Site: Breast | Laterality: Left

## 2024-01-23 MED ORDER — FENTANYL CITRATE (PF) 250 MCG/5ML IJ SOLN
INTRAMUSCULAR | Status: DC | PRN
  Administered 2024-01-23 (×4): 25 ug via INTRAVENOUS

## 2024-01-23 MED ORDER — PROPOFOL 10 MG/ML IV BOLUS
INTRAVENOUS | Status: DC | PRN
  Administered 2024-01-23: 120 mg via INTRAVENOUS

## 2024-01-23 MED ORDER — MIDAZOLAM HCL 2 MG/2ML IJ SOLN: 1.0000 mg | Freq: Once | INTRAMUSCULAR | Status: AC

## 2024-01-23 MED ORDER — PHENYLEPHRINE 80 MCG/ML (10ML) SYRINGE FOR IV PUSH (FOR BLOOD PRESSURE SUPPORT)
PREFILLED_SYRINGE | INTRAVENOUS | Status: DC | PRN
  Administered 2024-01-23 (×2): 80 ug via INTRAVENOUS
  Administered 2024-01-23: 160 ug via INTRAVENOUS
  Administered 2024-01-23 (×2): 80 ug via INTRAVENOUS

## 2024-01-23 MED ORDER — CEFAZOLIN SODIUM-DEXTROSE 2-4 GM/100ML-% IV SOLN
2.0000 g | INTRAVENOUS | Status: AC
  Administered 2024-01-23: 2 g via INTRAVENOUS
  Filled 2024-01-23: qty 100

## 2024-01-23 MED ORDER — IPRATROPIUM-ALBUTEROL 0.5-2.5 (3) MG/3ML IN SOLN
3.0000 mL | Freq: Once | RESPIRATORY_TRACT | Status: AC
  Administered 2024-01-23: 3 mL via RESPIRATORY_TRACT
  Filled 2024-01-23: qty 3

## 2024-01-23 MED ORDER — CHLORHEXIDINE GLUCONATE 0.12 % MT SOLN
15.0000 mL | Freq: Once | OROMUCOSAL | Status: AC
  Administered 2024-01-23: 15 mL via OROMUCOSAL
  Filled 2024-01-23: qty 15

## 2024-01-23 MED ORDER — PHENYLEPHRINE HCL-NACL 20-0.9 MG/250ML-% IV SOLN
INTRAVENOUS | Status: DC | PRN
  Administered 2024-01-23: 60 ug/min via INTRAVENOUS

## 2024-01-23 MED ORDER — CHLORHEXIDINE GLUCONATE CLOTH 2 % EX PADS: 6.0000 | MEDICATED_PAD | Freq: Once | CUTANEOUS | Status: DC

## 2024-01-23 MED ORDER — ORAL CARE MOUTH RINSE: 15.0000 mL | Freq: Once | OROMUCOSAL | Status: AC

## 2024-01-23 MED ORDER — FENTANYL CITRATE (PF) 100 MCG/2ML IJ SOLN
25.0000 ug | INTRAMUSCULAR | Status: DC | PRN
  Administered 2024-01-23: 25 ug via INTRAVENOUS

## 2024-01-23 MED ORDER — LIDOCAINE 2% (20 MG/ML) 5 ML SYRINGE
INTRAMUSCULAR | Status: DC | PRN
  Administered 2024-01-23: 60 mg via INTRAVENOUS

## 2024-01-23 MED ORDER — LIDOCAINE 2% (20 MG/ML) 5 ML SYRINGE
INTRAMUSCULAR | Status: AC
  Filled 2024-01-23: qty 5

## 2024-01-23 MED ORDER — HEMOSTATIC AGENTS (NO CHARGE) OPTIME
TOPICAL | Status: DC | PRN
  Administered 2024-01-23: 1

## 2024-01-23 MED ORDER — FENTANYL CITRATE (PF) 250 MCG/5ML IJ SOLN
INTRAMUSCULAR | Status: AC
  Filled 2024-01-23: qty 5

## 2024-01-23 MED ORDER — PROPOFOL 10 MG/ML IV BOLUS
INTRAVENOUS | Status: AC
  Filled 2024-01-23: qty 20

## 2024-01-23 MED ORDER — FENTANYL CITRATE (PF) 100 MCG/2ML IJ SOLN: 50.0000 ug | Freq: Once | INTRAMUSCULAR | Status: AC

## 2024-01-23 MED ORDER — ONDANSETRON HCL 4 MG/2ML IJ SOLN
INTRAMUSCULAR | Status: AC
  Filled 2024-01-23: qty 2

## 2024-01-23 MED ORDER — BUPIVACAINE-EPINEPHRINE (PF) 0.25% -1:200000 IJ SOLN
INTRAMUSCULAR | Status: AC
Start: 2024-01-23 — End: 2024-01-23
  Filled 2024-01-23: qty 30

## 2024-01-23 MED ORDER — LACTATED RINGERS IV SOLN: INTRAVENOUS | Status: DC

## 2024-01-23 MED ORDER — OXYCODONE HCL 5 MG/5ML PO SOLN: 5.0000 mg | Freq: Once | ORAL | Status: DC | PRN

## 2024-01-23 MED ORDER — FENTANYL CITRATE (PF) 100 MCG/2ML IJ SOLN
INTRAMUSCULAR | Status: AC
  Filled 2024-01-23: qty 2

## 2024-01-23 MED ORDER — FENTANYL CITRATE (PF) 100 MCG/2ML IJ SOLN
INTRAMUSCULAR | Status: AC
  Administered 2024-01-23: 50 ug via INTRAVENOUS
  Filled 2024-01-23: qty 2

## 2024-01-23 MED ORDER — OXYCODONE HCL 5 MG PO TABS: 5.0000 mg | ORAL_TABLET | Freq: Once | ORAL | Status: DC | PRN

## 2024-01-23 MED ORDER — INSULIN ASPART 100 UNIT/ML IJ SOLN: 0.0000 [IU] | INTRAMUSCULAR | Status: DC | PRN

## 2024-01-23 MED ORDER — DEXAMETHASONE SODIUM PHOSPHATE 10 MG/ML IJ SOLN
INTRAMUSCULAR | Status: DC | PRN
  Administered 2024-01-23: 5 mg via INTRAVENOUS

## 2024-01-23 MED ORDER — ONDANSETRON HCL 4 MG/2ML IJ SOLN
INTRAMUSCULAR | Status: DC | PRN
  Administered 2024-01-23: 4 mg via INTRAVENOUS

## 2024-01-23 MED ORDER — MIDAZOLAM HCL 2 MG/2ML IJ SOLN
INTRAMUSCULAR | Status: AC
  Administered 2024-01-23: 1 mg via INTRAVENOUS
  Filled 2024-01-23: qty 2

## 2024-01-23 MED ORDER — PROPOFOL 500 MG/50ML IV EMUL
INTRAVENOUS | Status: DC | PRN
  Administered 2024-01-23: 25 ug/kg/min via INTRAVENOUS

## 2024-01-23 MED ORDER — TRAMADOL HCL 50 MG PO TABS: 50.0000 mg | ORAL_TABLET | Freq: Four times a day (QID) | ORAL | 0 refills | Status: DC | PRN

## 2024-01-23 MED ORDER — DEXAMETHASONE SODIUM PHOSPHATE 10 MG/ML IJ SOLN
INTRAMUSCULAR | Status: AC
  Filled 2024-01-23: qty 1

## 2024-01-23 MED ORDER — 0.9 % SODIUM CHLORIDE (POUR BTL) OPTIME
TOPICAL | Status: DC | PRN
Start: 2024-01-23 — End: 2024-01-23
  Administered 2024-01-23: 1000 mL

## 2024-01-23 MED ORDER — BUPIVACAINE-EPINEPHRINE 0.25% -1:200000 IJ SOLN
INTRAMUSCULAR | Status: DC | PRN
  Administered 2024-01-23: 15 mL

## 2024-01-23 MED ORDER — ACETAMINOPHEN 500 MG PO TABS
1000.0000 mg | ORAL_TABLET | Freq: Once | ORAL | Status: AC
  Administered 2024-01-23: 1000 mg via ORAL
  Filled 2024-01-23: qty 2

## 2024-01-23 MED ORDER — BUPIVACAINE HCL (PF) 0.25 % IJ SOLN
INTRAMUSCULAR | Status: DC | PRN
  Administered 2024-01-23: 20 mL via PERINEURAL
  Administered 2024-01-23: 10 mL via PERINEURAL

## 2024-01-23 MED ORDER — AMISULPRIDE (ANTIEMETIC) 5 MG/2ML IV SOLN: 10.0000 mg | Freq: Once | INTRAVENOUS | Status: DC | PRN

## 2024-01-23 MED ORDER — PHENYLEPHRINE 80 MCG/ML (10ML) SYRINGE FOR IV PUSH (FOR BLOOD PRESSURE SUPPORT)
PREFILLED_SYRINGE | INTRAVENOUS | Status: AC
  Filled 2024-01-23: qty 10

## 2024-01-23 SURGICAL SUPPLY — 40 items
BAG COUNTER SPONGE SURGICOUNT (BAG) IMPLANT
BINDER BREAST LRG (GAUZE/BANDAGES/DRESSINGS) IMPLANT
BINDER BREAST XLRG (GAUZE/BANDAGES/DRESSINGS) IMPLANT
BIOPATCH RED 1 DISK 7.0 (GAUZE/BANDAGES/DRESSINGS) IMPLANT
CANISTER SUCTION 3000ML PPV (SUCTIONS) ×1 IMPLANT
CHLORAPREP W/TINT 26 (MISCELLANEOUS) ×1 IMPLANT
CLIP APPLIE 9.375 MED OPEN (MISCELLANEOUS) ×1 IMPLANT
COVER PROBE W GEL 5X96 (DRAPES) ×1 IMPLANT
COVER SURGICAL LIGHT HANDLE (MISCELLANEOUS) ×1 IMPLANT
DERMABOND ADVANCED .7 DNX12 (GAUZE/BANDAGES/DRESSINGS) ×1 IMPLANT
DEVICE DUBIN SPECIMEN MAMMOGRA (MISCELLANEOUS) ×1 IMPLANT
DRAPE CHEST BREAST 15X10 FENES (DRAPES) ×1 IMPLANT
DRSG TEGADERM 4X4.75 (GAUZE/BANDAGES/DRESSINGS) IMPLANT
ELECT CAUTERY BLADE 6.4 (BLADE) ×1 IMPLANT
ELECTRODE REM PT RTRN 9FT ADLT (ELECTROSURGICAL) ×1 IMPLANT
EVACUATOR SILICONE 100CC (DRAIN) IMPLANT
GAUZE PAD ABD 8X10 STRL (GAUZE/BANDAGES/DRESSINGS) ×1 IMPLANT
GLOVE BIO SURGEON STRL SZ8 (GLOVE) ×1 IMPLANT
GLOVE BIOGEL PI IND STRL 8 (GLOVE) ×1 IMPLANT
GOWN STRL REUS W/ TWL LRG LVL3 (GOWN DISPOSABLE) ×1 IMPLANT
GOWN STRL REUS W/ TWL XL LVL3 (GOWN DISPOSABLE) ×1 IMPLANT
HEMOSTAT ARISTA ABSORB 3G PWDR (HEMOSTASIS) IMPLANT
KIT BASIN OR (CUSTOM PROCEDURE TRAY) ×1 IMPLANT
KIT MARKER MARGIN INK (KITS) ×1 IMPLANT
LIGHT WAVEGUIDE WIDE FLAT (MISCELLANEOUS) IMPLANT
NDL 18GX1X1/2 (RX/OR ONLY) (NEEDLE) IMPLANT
NDL FILTER BLUNT 18X1 1/2 (NEEDLE) IMPLANT
NDL HYPO 25GX1X1/2 BEV (NEEDLE) ×1 IMPLANT
NEEDLE 18GX1X1/2 (RX/OR ONLY) (NEEDLE) IMPLANT
NEEDLE FILTER BLUNT 18X1 1/2 (NEEDLE) IMPLANT
NEEDLE HYPO 25GX1X1/2 BEV (NEEDLE) ×1 IMPLANT
NS IRRIG 1000ML POUR BTL (IV SOLUTION) ×1 IMPLANT
PACK GENERAL/GYN (CUSTOM PROCEDURE TRAY) ×1 IMPLANT
SUT ETHILON 2 0 FS 18 (SUTURE) IMPLANT
SUT MNCRL AB 4-0 PS2 18 (SUTURE) ×1 IMPLANT
SUT VIC AB 3-0 SH 18 (SUTURE) ×1 IMPLANT
SYR 3ML LL SCALE MARK (SYRINGE) IMPLANT
SYR CONTROL 10ML LL (SYRINGE) ×1 IMPLANT
TOWEL GREEN STERILE (TOWEL DISPOSABLE) ×1 IMPLANT
TOWEL GREEN STERILE FF (TOWEL DISPOSABLE) ×1 IMPLANT

## 2024-01-23 NOTE — Anesthesia Procedure Notes (Signed)
 Anesthesia Regional Block: Pectoralis block   Pre-Anesthetic Checklist: , timeout performed,  Correct Patient, Correct Site, Correct Laterality,  Correct Procedure, Correct Position, site marked,  Risks and benefits discussed,  Surgical consent,  Pre-op evaluation,  At surgeon's request and post-op pain management  Laterality: Left  Prep: chloraprep       Needles:  Injection technique: Single-shot  Needle Type: Echogenic Stimulator Needle     Needle Length: 9cm  Needle Gauge: 21     Additional Needles:   Procedures:,,,, ultrasound used (permanent image in chart),,    Narrative:  Start time: 01/23/2024 11:45 AM End time: 01/23/2024 11:50 AM Injection made incrementally with aspirations every 5 mL.  Performed by: Personally  Anesthesiologist: Peggye Delon Brunswick, MD  Additional Notes: Discussed risks and benefits of nerve block including, but not limited to, prolonged and/or permanent nerve injury involving sensory and/or motor function. Monitors were applied and a time-out was performed. The nerve and associated structures were visualized under ultrasound guidance. After negative aspiration, local anesthetic was slowly injected around the nerve. There was no evidence of high pressure during the procedure. There were no paresthesias. VSS remained stable and the patient tolerated the procedure well.

## 2024-01-23 NOTE — Discharge Instructions (Signed)

## 2024-01-23 NOTE — Op Note (Signed)
 Preoperative diagnosis: Recurrent left breast cancer upper outer quadrant involving axillary lymph nodes  Postoperative diagnosis: Same  Procedure: Left breast seed localized lumpectomy, left axillary lymph node dissection with resection of overlying skin  Surgeon: Debby Shipper, MD  Anesthesia: LMA with 0.25% Marcaine  local with epinephrine  plus left pectoral block per anesthesia protocol  Drains: 19 round  EBL: 30 cc  Specimen: Left breast tissue with seed clip verified by Faxitron, additional left superior margin, left axillary contents with lymph node containing radioactive seed, additional skin from left axilla  Indications for procedure: The patient's a frail 68 year old female who had breast cancer in 2021 and who recurred in 2024.  Her previous surgical history was lumpectomy and radiation therapy.  She did not receive radiation therapy the first time she had breast cancer in 2021.  She also had a negative sentinel node at that time.  She recurred he was treated by local excision due to her poor overall health.  Unfortunately, she has recurred in her axilla.  She also had an area of abnormality in her left breast that was core biopsy negative for cancer but was still suspicious per radiology interpretation lumpectomy recommended.  She has limited options of treatment.  She will not tolerate radiation therapy.  She would not tolerate chemotherapy.  She was evaluated for surgical excision more for palliative control given her overall poor health and the fact she has recurred twice within 5 years.  We discussed doing nothing at all.  She was cleared by pulmonary to undergo lumpectomy with lymph node dissection.  We discussed the pros and cons of this as well as her increased risk of complications due to her overall poor health and severe COPD.  We discussed complications of bleeding, infection, nerve injury, blood vessel injury, recurrence, cardiovascular event, exacerbation of underlying  medical problems and death.  Her expected mortality from surgical intervention is 3 to 4%.  This was reviewed with the patient at great length prior to undergoing any surgical options.  I explained surgeries are only option for local disease control and that we would have to be fairly aggressive if were going to have any attempt at controlling this.  She voiced understanding of the rationale wish to proceed with surgical treatment.  Description of procedure: The patient was met in the holding area and questions were answered.  She underwent pectoral block of the left breast per anesthesia protocol.  She had 2 seeds placed one of the breast 1 in the axilla as an outpatient by radiology.  She was then taken back to the operative room.  She was placed upon the OR table.  After induction of LMA anesthesia, left breast was prepped and draped in sterile fashion timeout performed.  Proper patient, site and procedure verified.  She received appropriate preoperative antibiotics.  Neoprobe was used identify the seed left breast upper outer quadrant.  Incision was made at the previous scar at the lumpectomy site.  Dissection was carried down.  We took out the actual scar from the previous lumpectomy which is more superior to the signal of the seed.  This was labeled a superior margin.  I then went more inferior to take out the area of concern.  This was widely excised with grossly negative margins.  Imaging revealed the seed and clip to be in the specimen.  Both specimens were oriented with ink and sent to pathology.  Cavities made hemostatic with cautery.  Irrigation was used.  Clips were placed.  It was  then closed with a deep layer 3-0 Vicryl.  4 Monocryl was used to close the skin.  A left Eksir incision was made along the inferior hairline.  Dissection was carried into the left Eksir contents.  There is some scarring from previous lymph node removal look like.  We then began extensive left Eksir lymph node dissection.   The inferior boundaries identified.  This was taken down to the chest wall musculature.  The long thoracic nerve identified and preserved as we dissected more superiorly.  Small arterial and venous branches were controlled with clips.  Once we encountered  the axillary vein, dissection was carried more posterior.  The main branch from this to the axilla was controlled with clips.  This is taken down to the thoracodorsal trunk.  This was skeletonized since there is significant adenopathy which was abnormal.  This disease extended more laterally to involve the skin of the posterior axilla.  This was excised and sent separately from the specimen since this was concerning to me due to its thickness and abnormality especially the undersurface of this.  Given that she has no other treatments available, I felt aggressive resection was necessary in this circumstance to try to control disease.  The cavity was made hemostatic with cautery.  Examination showed no bleeding from the left axilla  contents.  The long thoracic nerve, thoracodorsal trunk and extra vein were all preserved and easily visualized.  All lymphovascular tissue and these boundaries were removed especially the area that was more lateral posterior in the axilla.  This included the skin.  Arista was placed.  A 19 round drain was placed into the cavity.  Both incisions were closed with a combination of 3-0 Vicryl and 4-0 Monocryl.  Dermabond applied.  Breast binder placed.  All counts found to be correct.  The patient was awoke extubated taken to recovery in satisfactory condition.

## 2024-01-23 NOTE — H&P (Signed)
 History of Present Illness: Deborah [name] is a 68 y.o. female who is seen today as an office consultation for evaluation of NEW PROBLEM  Patient presents for evaluation of left breast cancer. She has a history of a left breast lumpectomy with sentinel of node mapping in September 2021 by Dr. Ethyl. The patient then developed a recurrent left breast cancer at the same site in 2020 for January. She underwent reexcision. She has lung cancer and has severe emphysema. She was felt to be a poor operative candidate for any other significant therapy but was able to tolerate some chemotherapy. Current initial tumor was triple negative. She is now ER positive PR negative HER2/neu negative with a KI of 50%. She does have disease in the lymph node left axilla. The biopsy to her breast was felt to be discordant.  Review of Systems: A complete review of systems was obtained from the patient. I have reviewed this information and discussed as appropriate with the patient. See HPI as well for other ROS.    Medical History: Past Medical History:  Diagnosis Date  Anemia  Asthma, unspecified asthma severity, unspecified whether complicated, unspecified whether persistent (HHS-HCC)  CHF (congestive heart failure) (CMS/HHS-HCC)  COPD (chronic obstructive pulmonary disease) (CMS/HHS-HCC)  History of cancer  Sleep apnea   There is no problem list on file for this patient.  Past Surgical History:  Procedure Laterality Date  MASTECTOMY  MASTECTOMY PARTIAL / LUMPECTOMY    No Known Allergies  Current Outpatient Medications on File Prior to Visit  Medication Sig Dispense Refill  acetaminophen  (TYLENOL ) 325 MG tablet Take by mouth  albuterol  90 mcg/actuation inhaler Inhale into the lungs  BREZTRI  AEROSPHERE 160-9-4.8 mcg/actuation inhaler Inhale into the lungs  dilTIAZem  (CARDIZEM  CD) 240 MG CD capsule Take 240 mg by mouth once daily  FUROsemide  (LASIX ) 40 MG tablet Take 20 mg by mouth once daily   KLOR-CON  M20 20 mEq ER tablet TAKE 1/2 TABLET BY MOUTH EVERY DAY. OFFICE VISIT REQUIRED FOR FURTHER REFILLS.  lidocaine -prilocaine  (EMLA ) cream APPLY TO AFFECTED AREA ONCE AS DIRECTED  metFORMIN  (GLUCOPHAGE -XR) 500 MG XR tablet TAKE 1 TABLET BY MOUTH DAILY WITH BREAKFAST. FOR DIABETES.  montelukast  (SINGULAIR ) 10 mg tablet Take by mouth  OXYGEN -AIR DELIVERY SYSTEMS MISC Inhale into the lungs  rosuvastatin  (CRESTOR ) 5 MG tablet TAKE 1 TABLET (5 MG TOTAL) BY MOUTH DAILY. FOR CHOLESTEROL.   No current facility-administered medications on file prior to visit.   Family History  Problem Relation Age of Onset  Stroke Sister    Social History   Tobacco Use  Smoking Status Former  Types: Cigarettes  Smokeless Tobacco Never    Social History   Socioeconomic History  Marital status: Married  Tobacco Use  Smoking status: Former  Types: Cigarettes  Smokeless tobacco: Never  Vaping Use  Vaping status: Never Used  Substance and Sexual Activity  Alcohol use: Never  Drug use: Never   Social Drivers of Health   Transportation Needs: Unmet Transportation Needs (05/02/2018)  Received from [facility]  PRAPARE - Transportation  Lack of Transportation (Medical): Yes  Lack of Transportation (Non-Medical): No  Housing Stability: Unknown (12/28/2023)  Housing Stability Vital Sign  Homeless in the Last Year: No   Objective:   Vitals:  12/28/23 1004  PainSc: 0-No pain  PainLoc: Breast   There is no height or weight on file to calculate BMI.  Physical Exam Exam conducted with a chaperone present.   Cardiovascular:  Rate and Rhythm: Normal rate.  Pulmonary:  Breath sounds: Wheezing present.  Comments: On home O2. She can walk about 30 feet without being short of breath. Chest:  Breasts: Right: Normal.   Comments: Left breast scar noted. No mass at the lumpectomy site. She does have significant left axillary adenopathy. Lymphadenopathy:  Upper Body:  Right upper body: No  supraclavicular or axillary adenopathy.  Left upper body: Axillary adenopathy present. No supraclavicular adenopathy.   Skin: General: Skin is warm.   Neurological:  General: No focal deficit present.  Mental Status: She is alert.   Psychiatric:  Mood and Affect: Mood normal.     Labs, Imaging and Diagnostic Testing:  Diagnoses and all orders for this visit:  Breast cancer metastasized to axillary lymph node, left (CMS/HHS-HCC)   Given her overall health as well as history of severe lung disease, surgical options are somewhat limited. Certainly a left breast lumpectomy can be repeated to the due to the discordant biopsy but I think she is in need a left axillary lymph node dissection due to previous radiation therapy and recurrent disease. I do not think she will build to tolerate radiation therapy therefore this will give her the best opportunity for local control. She sees medical oncology next week. Obviously if there is medical options available we may want a consider that and lieu of surgery. She is at significant increased operative risk due to her severe COPD but she was able to tolerate a lumpectomy before and did okay with that. She thinks her lungs are better due to her increased mobility and decreasing shortness of breath. Her lung cancer appears stable for right now.   DEBBY CURTISTINE SHIPPER, MD

## 2024-01-23 NOTE — Interval H&P Note (Signed)
 History and Physical Interval Note:  01/23/2024 12:01 PM  Deborah [name]  has presented today for surgery, with the diagnosis of LEFT BREAST CANCER.  The various methods of treatment have been discussed with the patient and family. After consideration of risks, benefits and other options for treatment, the patient has consented to  Procedure(s) with comments: BREAST LUMPECTOMY WITH RADIOACTIVE SEED AND AXILLARY LYMPH NODE DISSECTION (Left) - GEN w/PEC BLOCK LEFT BREAST SEED LUMPECTOMY LEFT AXILLARY LYMPH NODE DISSECTION as a surgical intervention.  The patient's history has been reviewed, patient examined, no change in status, stable for surgery.  I have reviewed the patient's chart and labs.  Questions were answered to the patient's satisfaction.     Dalylah Ramey A Kilynn Fitzsimmons

## 2024-01-23 NOTE — Anesthesia Postprocedure Evaluation (Signed)
 Anesthesia Post Note  Patient: Deborah [name]  Procedure(s) Performed: BREAST LUMPECTOMY WITH RADIOACTIVE SEED AND AXILLARY LYMPH NODE DISSECTION (Left: Breast)     Patient location during evaluation: PACU Anesthesia Type: General Level of consciousness: awake Pain management: pain level controlled Vital Signs Assessment: post-procedure vital signs reviewed and stable Respiratory status: spontaneous breathing, nonlabored ventilation and respiratory function stable Cardiovascular status: blood pressure returned to baseline and stable Postop Assessment: no apparent nausea or vomiting Anesthetic complications: no   No notable events documented.  Last Vitals:  Vitals:   01/23/24 1536 01/23/24 1540  BP:  100/61  Pulse: 88 88  Resp: 16 13  Temp:  36.4 C  SpO2: 91% 94%    Last Pain:  Vitals:   01/23/24 1540  TempSrc:   PainSc: 1                  Delon Aisha Arch

## 2024-01-23 NOTE — Transfer of Care (Signed)
 Immediate Anesthesia Transfer of Care Note  Patient: Deborah [name]  Procedure(s) Performed: BREAST LUMPECTOMY WITH RADIOACTIVE SEED AND AXILLARY LYMPH NODE DISSECTION (Left: Breast)  Patient Location: PACU  Anesthesia Type:General and Regional  Level of Consciousness: awake and patient cooperative  Airway & Oxygen  Therapy: Patient Spontanous Breathing and Patient connected to face mask oxygen   Post-op Assessment: Report given to RN and Post -op Vital signs reviewed and stable  Post vital signs: Reviewed and stable  Last Vitals:  Vitals Value Taken Time  BP 106/59 01/23/24 14:54  Temp 36.4 C 01/23/24 14:54  Pulse 94 01/23/24 14:57  Resp 23 01/23/24 14:57  SpO2 96 % 01/23/24 14:57  Vitals shown include unfiled device data.  Last Pain:  Vitals:   01/23/24 1128  TempSrc:   PainSc: 0-No pain         Complications: No notable events documented.

## 2024-01-23 NOTE — Anesthesia Procedure Notes (Addendum)
 Procedure Name: LMA Insertion Date/Time: 01/23/2024 12:55 PM  Performed by: Shlomo Tinnie SAILOR, RNPre-anesthesia Checklist: Emergency Drugs available, Patient identified, Suction available, Patient being monitored and Timeout performed Patient Re-evaluated:Patient Re-evaluated prior to induction Oxygen  Delivery Method: Circle system utilized Preoxygenation: Pre-oxygenation with 100% oxygen  Induction Type: IV induction Ventilation: Mask ventilation without difficulty LMA: LMA inserted LMA Size: 4.0 Number of attempts: 1 Placement Confirmation: positive ETCO2 Tube secured with: Tape Dental Injury: Teeth and Oropharynx as per pre-operative assessment  Comments: Placed by srna

## 2024-01-24 ENCOUNTER — Other Ambulatory Visit: Payer: Self-pay | Admitting: Medical Genetics

## 2024-01-24 ENCOUNTER — Encounter (HOSPITAL_COMMUNITY): Payer: Self-pay | Admitting: Surgery

## 2024-01-25 LAB — SURGICAL PATHOLOGY

## 2024-02-05 ENCOUNTER — Other Ambulatory Visit (HOSPITAL_COMMUNITY)
Admission: RE | Admit: 2024-02-05 | Discharge: 2024-02-05 | Disposition: A | Discharge status: Home / Self Care | Payer: Self-pay | Source: Ambulatory Visit | Attending: Medical Genetics | Admitting: Medical Genetics

## 2024-02-15 LAB — GENECONNECT MOLECULAR SCREEN: Genetic Analysis Overall Interpretation: NEGATIVE

## 2024-02-23 ENCOUNTER — Other Ambulatory Visit: Payer: Self-pay | Admitting: Primary Care

## 2024-02-23 DIAGNOSIS — E1165 Type 2 diabetes mellitus with hyperglycemia: Secondary | ICD-10-CM

## 2024-02-27 NOTE — Assessment & Plan Note (Signed)
 04/08/20: Left lumpectomy Jeoffrey): three foci of IDC, grade 3, 3.3cm, 1.0cm, 0.7cm, with high grade DCIS, clear margins, 2 left axillary lymph nodes negative for carcinoma. HER-2 negative (0), ER/PR negative, Ki67 40%.     Treatment summary: 1. Adjuvant chemotherapy with Adriamycin  and Cytoxan  dose dense 4 followed by Taxol  weekly 12 (discontinued after cycle 1 because of profound fatigue and generalized failure to thrive along with hypoxia and tachycardia) the first cycle was even given at a low dose 2. Followed by adjuvant radiation therapy (patient does not want to receive radiation) 3.  Relapse:08/16/2022:Left lumpectomy: Grade 3 poorly differentiated carcinoma 2.4 cm with DCIS, margins negative, lymphovascular invasion not identified, ER 30%, PR 0%, HER2 1+ negative, Ki-67 20%  4.  Current treatment: Anastrozole  started 12/08/2022 5.  12/21/2023: Left axillary lymph node recurrence   Anastrozole  toxicities:   Breast cancer surveillance: Breast exam 12/31/2023: Benign Mammogram 12/21/2023: Indeterminate left breast mass 1.1 cm: Ultrasound breast biopsy: Benign, lymph node biopsy: Positive for breast cancer ER 40% weak, PR 0%, HER2 negative, Ki67 50%   Recommendation: ALND with left lumpectomy followed by continued antiestrogen therapy   Severe COPD: 24-hour oxygen  Lung cancer treatment and surveillance with Dr. Sherrod   Profound redness of the face and chest: Sent for dermatology referral

## 2024-02-28 ENCOUNTER — Inpatient Hospital Stay: Attending: Internal Medicine | Admitting: Hematology and Oncology

## 2024-02-28 DIAGNOSIS — Z79811 Long term (current) use of aromatase inhibitors: Secondary | ICD-10-CM | POA: Diagnosis not present

## 2024-02-28 DIAGNOSIS — Z171 Estrogen receptor negative status [ER-]: Secondary | ICD-10-CM | POA: Diagnosis not present

## 2024-02-28 DIAGNOSIS — C3432 Malignant neoplasm of lower lobe, left bronchus or lung: Secondary | ICD-10-CM

## 2024-02-28 DIAGNOSIS — C50412 Malignant neoplasm of upper-outer quadrant of left female breast: Secondary | ICD-10-CM | POA: Diagnosis not present

## 2024-02-28 DIAGNOSIS — Z85118 Personal history of other malignant neoplasm of bronchus and lung: Secondary | ICD-10-CM | POA: Diagnosis not present

## 2024-02-28 NOTE — Progress Notes (Signed)
 HEMATOLOGY-ONCOLOGY TELEPHONE VISIT PROGRESS NOTE  I connected with our patient on 02/28/24 at 10:30 AM EDT by telephone and verified that I am speaking with the correct person using two identifiers.  I discussed the limitations, risks, security and privacy concerns of performing an evaluation and management service by telephone and the availability of in person appointments.  I also discussed with the patient that there may be a patient responsible charge related to this service. The patient expressed understanding and agreed to proceed.   History of Present Illness:    History of Present Illness Deborah [name] is a 68 year old female with breast cancer and lung cancer who presents for follow-up after breast surgery.  The surgical site is nearly healed with minimal drainage. She reports feeling generally well. All suspicious skin was excised and showed no malignancy. No cancer was detected in the breast tissue, but one lymph node was involved.  She has a history of lung cancer treated with radiation therapy on the left side and seven weeks of chemotherapy. She is currently on anastrozole  and uses oxygen  continuously.  A previous rash has resolved without dermatological intervention. Her skin remains dry but without redness or welts.    Oncology History  Primary malignant neoplasm of bronchus of left lower lobe (HCC)  05/02/2018 Initial Diagnosis   Stage III squamous cell carcinoma of left lung (HCC)   05/13/2018 - 06/24/2018 Chemotherapy   The patient had palonosetron  (ALOXI ) injection 0.25 mg, 0.25 mg, Intravenous,  Once, 7 of 7 cycles Administration: 0.25 mg (05/13/2018), 0.25 mg (06/10/2018), 0.25 mg (06/17/2018), 0.25 mg (05/20/2018), 0.25 mg (06/24/2018), 0.25 mg (05/27/2018), 0.25 mg (06/03/2018) CARBOplatin  (PARAPLATIN ) 300 mg in sodium chloride  0.9 % 250 mL chemo infusion, 300 mg (100 % of original dose 300 mg), Intravenous,  Once, 7 of 7 cycles Dose modification: 300 mg (original  dose 300 mg, Cycle 1) Administration: 300 mg (05/13/2018), 300 mg (06/10/2018), 260 mg (06/17/2018), 300 mg (05/20/2018), 300 mg (06/24/2018), 300 mg (05/27/2018), 300 mg (06/03/2018) PACLitaxel  (TAXOL ) 96 mg in sodium chloride  0.9 % 250 mL chemo infusion (</= 80mg /m2), 45 mg/m2 = 96 mg, Intravenous,  Once, 7 of 7 cycles Administration: 96 mg (05/13/2018), 96 mg (06/10/2018), 96 mg (06/17/2018), 96 mg (05/20/2018), 96 mg (06/24/2018), 96 mg (05/27/2018), 96 mg (06/03/2018)  for chemotherapy treatment.    07/31/2018 - 07/15/2019 Chemotherapy   The patient had durvalumab  (IMFINZI ) 1,000 mg in sodium chloride  0.9 % 100 mL chemo infusion, 1,020 mg, Intravenous,  Once, 26 of 26 cycles Administration: 1,000 mg (07/31/2018), 1,000 mg (10/22/2018), 1,000 mg (11/05/2018), 1,000 mg (11/19/2018), 1,000 mg (12/03/2018), 1,000 mg (08/14/2018), 1,000 mg (12/17/2018), 1,000 mg (08/27/2018), 1,000 mg (09/10/2018), 1,000 mg (09/24/2018), 1,000 mg (10/11/2018), 1,000 mg (12/31/2018), 1,000 mg (01/14/2019), 1,120 mg (01/28/2019), 1,120 mg (02/11/2019), 1,120 mg (02/25/2019), 1,120 mg (03/11/2019), 1,120 mg (03/25/2019), 1,120 mg (04/08/2019), 1,120 mg (04/22/2019), 1,120 mg (05/06/2019), 1,120 mg (05/20/2019), 1,120 mg (06/03/2019), 1,120 mg (06/17/2019), 1,120 mg (07/01/2019), 1,120 mg (07/15/2019)  for chemotherapy treatment.    05/11/2020 - 05/13/2020 Chemotherapy   Received 1 cycle of Adriamycin  and Cytoxan  and discontinued it   11/17/2021 Cancer Staging   Staging form: Lung, AJCC 8th Edition - Clinical: Stage IIIB (cT3, cN2, cM0) - Signed by Sherrod Sherrod, MD on 11/17/2021   Malignant neoplasm of upper-outer quadrant of left breast in female, estrogen receptor negative (HCC)  02/19/2020 Cancer Staging   Staging form: Breast, AJCC 8th Edition - Clinical stage from 02/19/2020: Stage IIB (cT2, cN0,  cM0, G3, ER-, PR-, HER2-) - Signed by Crawford Morna Pickle, NP on 02/25/2020   02/19/2020 Initial Diagnosis   Chest CT for lung cancer follow-up  showed a left breast module. Mammogram and US  showed a 3.7cm mass with calcifications at the 2 o'clock position in the left breast, no left axillary adenopathy. Biopsy showed IDC, grade 3, HER-2 negative (0), ER/PR negative, Ki67 40%.   04/08/2020 Surgery   Left lumpectomy Jeoffrey): three foci of IDC, grade 3, 3.3cm, 1.0cm, 0.7cm, with high grade DCIS, clear margins, 2 left axillary lymph nodes negative for carcinoma.    05/11/2020 - 05/13/2020 Chemotherapy   Received 1 cycle of Adriamycin  and Cytoxan  and discontinued it   10/18/2021 Relapse/Recurrence   Mammogram detected indeterminate left breast mass at 12 o'clock position measuring 6 mm, 2 focal areas of calcifications (H 8 mm) within the left breast favored to be fat necrosis. The 6 mm mass on biopsy came back as grade 3 IDC ER 30% weak, PR 0%, HER2 negative, Ki-67 20%   08/16/2022 Surgery   Left lumpectomy: Grade 3 poorly differentiated carcinoma 2.4 cm with DCIS, margins negative, lymphovascular invasion not identified, ER 30%, PR 0%, HER2 1+ negative, Ki-67 20%   08/16/2022 Relapse/Recurrence    Left lumpectomy: Grade 3 poorly differentiated carcinoma 2.4 cm with DCIS, margins negative, lymphovascular invasion not identified, ER 30%, PR 0%, HER2 1+ negative, Ki-67 20%   12/21/2023 Relapse/Recurrence    Left axillary lymph node recurrence: Biopsy: Positive for metastatic breast cancer ER 45% weak, PR 0%, Ki67 50%, HER2 0 Left breast biopsy: Benign     REVIEW OF SYSTEMS:   Constitutional: Denies fevers, chills or abnormal weight loss All other systems were reviewed with the patient and are negative. Observations/Objective:     Assessment Plan:  Primary malignant neoplasm of bronchus of left lower lobe (HCC) 04/08/20: Left lumpectomy Jeoffrey): three foci of IDC, grade 3, 3.3cm, 1.0cm, 0.7cm, with high grade DCIS, clear margins, 2 left axillary lymph nodes negative for carcinoma. HER-2 negative (0), ER/PR negative, Ki67 40%.      Treatment summary: 1. Adjuvant chemotherapy with Adriamycin  and Cytoxan  dose dense 4 followed by Taxol  weekly 12 (discontinued after cycle 1 because of profound fatigue and generalized failure to thrive along with hypoxia and tachycardia) the first cycle was even given at a low dose 2. Followed by adjuvant radiation therapy (patient had prior lung XRT and therefore she did not get it) 3.  Relapse:08/16/2022:Left lumpectomy: Grade 3 poorly differentiated carcinoma 2.4 cm with DCIS, margins negative, lymphovascular invasion not identified, ER 30%, PR 0%, HER2 1+ negative, Ki-67 20%  4.  Current treatment: Anastrozole  started 12/08/2022 5.  12/21/2023: Mammogram 12/21/2023: Indeterminate left breast mass 1.1 cm: Ultrasound breast biopsy: Benign, lymph node biopsy: Positive for breast cancer ER 40% weak, PR 0%, HER2 negative, Ki67 50% 6.  01/23/2024: Additional superior margin resection: Benign, left upper quadrant lumpectomy: Benign, 1/5 lymph nodes positive   She is not a candidate for chemotherapy because of profound toxicities earlier. Anastrozole  toxicities: Tolerating extremely well    Severe COPD: 24-hour oxygen  Lung cancer treatment and surveillance with Dr. Sherrod I sent a message to Dr. Dewey to see if there is any role for radiation.  Based on her previous radiation therapy I suspect that she may not be a candidate for more radiation but we will check on that. Profound redness of the face and chest: Resolved   Assessment & Plan Malignant neoplasm of left breast, status post surgery, on  anastrozole  Recovering well post-surgery with minimal drainage. Pathology negative for breast tissue cancer, one lymph node affected. Cancer 40% estrogen positive. Previous chemotherapy poorly tolerated, continuing anastrozole . - Continue anastrozole  1 mg orally daily. - Monitor surgical site for healing and drainage. - Discuss radiation therapy options with the team, considering previous lung cancer  radiation.  History of malignant neoplasm of left lung, post-radiation Previous lung cancer treated with radiation. Previous radiation details relevant to current breast cancer treatment. - Send note to Dr. Dewey to review previous radiation details. - Discuss with the team the potential role of additional radiation therapy.      I discussed the assessment and treatment plan with the patient. The patient was provided an opportunity to ask questions and all were answered. The patient agreed with the plan and demonstrated an understanding of the instructions. The patient was advised to call back or seek an in-person evaluation if the symptoms worsen or if the condition fails to improve as anticipated.   I provided 20 minutes of non-face-to-face time during this encounter.  This includes time for charting and coordination of care   Naomi MARLA Chad, MD

## 2024-02-29 ENCOUNTER — Telehealth: Payer: Self-pay | Admitting: Hematology and Oncology

## 2024-02-29 NOTE — Telephone Encounter (Signed)
 left vm for pt about scheduled appt times and dates

## 2024-03-04 ENCOUNTER — Encounter: Payer: Self-pay | Admitting: Radiation Oncology

## 2024-03-04 ENCOUNTER — Ambulatory Visit: Admitting: Radiation Oncology

## 2024-03-04 ENCOUNTER — Ambulatory Visit

## 2024-03-05 ENCOUNTER — Encounter: Payer: Self-pay | Admitting: Hematology and Oncology

## 2024-03-05 NOTE — Progress Notes (Signed)
 I called the patient to see if she would like to reschedule her appointment as she no showed for today's appointment.

## 2024-03-06 ENCOUNTER — Telehealth: Payer: Self-pay | Admitting: Radiation Oncology

## 2024-03-06 NOTE — Telephone Encounter (Signed)
 Left message for patient to call back to reschedule missed appointment.

## 2024-03-07 ENCOUNTER — Telehealth: Payer: Self-pay | Admitting: Radiation Oncology

## 2024-03-07 NOTE — Telephone Encounter (Signed)
 Left message for patient to call back to reschedule missed appointment per 8/14 referral.

## 2024-03-10 ENCOUNTER — Telehealth: Payer: Self-pay | Admitting: Radiation Oncology

## 2024-03-10 NOTE — Telephone Encounter (Signed)
 Left message for patient to call back to reschedule missed 8/19 appointment per 8/14 referral.

## 2024-03-12 ENCOUNTER — Telehealth: Payer: Self-pay | Admitting: Radiation Oncology

## 2024-03-12 ENCOUNTER — Ambulatory Visit: Payer: Medicare Other | Admitting: Primary Care

## 2024-03-12 NOTE — Telephone Encounter (Signed)
 Mailed letter for patient to call back to reschedule missed 8/19 appointment per 8/14 referral.

## 2024-03-13 ENCOUNTER — Inpatient Hospital Stay

## 2024-03-13 DIAGNOSIS — Z95828 Presence of other vascular implants and grafts: Secondary | ICD-10-CM

## 2024-03-13 MED ORDER — SODIUM CHLORIDE 0.9% FLUSH
10.0000 mL | Freq: Once | INTRAVENOUS | Status: AC
  Administered 2024-03-13: 10 mL

## 2024-03-18 ENCOUNTER — Ambulatory Visit: Admitting: Primary Care

## 2024-03-21 ENCOUNTER — Encounter: Payer: Self-pay | Admitting: Primary Care

## 2024-03-21 ENCOUNTER — Ambulatory Visit: Admitting: Primary Care

## 2024-03-21 VITALS — BP 100/68 | HR 106 | Temp 97.2°F | Ht 63.0 in | Wt 129.0 lb

## 2024-03-21 DIAGNOSIS — Z720 Tobacco use: Secondary | ICD-10-CM

## 2024-03-21 DIAGNOSIS — Z171 Estrogen receptor negative status [ER-]: Secondary | ICD-10-CM

## 2024-03-21 DIAGNOSIS — I5032 Chronic diastolic (congestive) heart failure: Secondary | ICD-10-CM

## 2024-03-21 DIAGNOSIS — J9611 Chronic respiratory failure with hypoxia: Secondary | ICD-10-CM

## 2024-03-21 DIAGNOSIS — E1165 Type 2 diabetes mellitus with hyperglycemia: Secondary | ICD-10-CM

## 2024-03-21 DIAGNOSIS — E785 Hyperlipidemia, unspecified: Secondary | ICD-10-CM

## 2024-03-21 DIAGNOSIS — J432 Centrilobular emphysema: Secondary | ICD-10-CM

## 2024-03-21 DIAGNOSIS — C50412 Malignant neoplasm of upper-outer quadrant of left female breast: Secondary | ICD-10-CM

## 2024-03-21 DIAGNOSIS — Z0000 Encounter for general adult medical examination without abnormal findings: Secondary | ICD-10-CM | POA: Diagnosis not present

## 2024-03-21 DIAGNOSIS — E2839 Other primary ovarian failure: Secondary | ICD-10-CM | POA: Diagnosis not present

## 2024-03-21 DIAGNOSIS — I10 Essential (primary) hypertension: Secondary | ICD-10-CM | POA: Diagnosis not present

## 2024-03-21 DIAGNOSIS — R002 Palpitations: Secondary | ICD-10-CM

## 2024-03-21 DIAGNOSIS — G4733 Obstructive sleep apnea (adult) (pediatric): Secondary | ICD-10-CM

## 2024-03-21 DIAGNOSIS — Z1211 Encounter for screening for malignant neoplasm of colon: Secondary | ICD-10-CM

## 2024-03-21 MED ORDER — VARENICLINE TARTRATE (STARTER) 0.5 MG X 11 & 1 MG X 42 PO TBPK: ORAL_TABLET | ORAL | 0 refills | Status: DC

## 2024-03-21 NOTE — Assessment & Plan Note (Signed)
 Follow with pulmonology.  Office notes reviewed from March 2025.  Continue oxygen  at 2 L continuously. Proceed with CT chest in October as scheduled.

## 2024-03-21 NOTE — Patient Instructions (Signed)
 Start the Chantix  starter pack.  Follow package instructions.  [facility].   Please schedule a follow up visit for 6 months for a diabetes check.  It was a pleasure to see you today!

## 2024-03-21 NOTE — Assessment & Plan Note (Signed)
 Following with oncology.  Office notes reviewed from August 2025. Continue anastrozole  1 mg daily. Mammogram up-to-date

## 2024-03-21 NOTE — Assessment & Plan Note (Signed)
 Well-controlled.  Reviewed A1c from July 2025.  Continue Ozempic  1 mg weekly per patient request. We did discuss that if she continue to lose weight we may need to back off the dose.  Follow-up in 6 months

## 2024-03-21 NOTE — Assessment & Plan Note (Signed)
 Ready to quit  Prescription for Chantix  starter pack sent to pharmacy she did well on this previously. She is aware of potential side effects and historically has had no issues.

## 2024-03-21 NOTE — Assessment & Plan Note (Signed)
Controlled.  Continue diltiazem to 40 mg daily

## 2024-03-21 NOTE — Assessment & Plan Note (Signed)
 Continue rosuvastatin  5 mg daily. She declines lab work today and will have this done next week with her cardiologist.

## 2024-03-21 NOTE — Assessment & Plan Note (Signed)
 No longer managed on CPAP for patient.

## 2024-03-21 NOTE — Assessment & Plan Note (Signed)
 Stable.  Following with pulmonology, office notes reviewed from March 2025.  Continue Breztri  inhaler, 2 puffs twice daily, albuterol  nebulizers as needed.

## 2024-03-21 NOTE — Assessment & Plan Note (Addendum)
 Appears euvolemic today.  Following with cardiology, office notes reviewed from November 2024.  Continue furosemide .  30 mg daily, diltiazem  to 240 mg daily,

## 2024-03-21 NOTE — Assessment & Plan Note (Signed)
 Controlled.  Continue with diltiazem  240 mg daily.

## 2024-03-21 NOTE — Assessment & Plan Note (Signed)
 Immunizations UTD. Declines influenza vaccine.  She will have this done in October.  Mammogram UTD Colonoscopy overdue, referral placed to GI.  Discussed the importance of a healthy diet and regular exercise in order for weight loss, and to reduce the risk of further co-morbidity.  Exam stable. Labs reviewed.  Follow up in 1 year for repeat physical.

## 2024-03-21 NOTE — Progress Notes (Signed)
 Subjective:    Patient ID: Deborah [name], female    DOB: [birth year], 68 y.o.   MRN: [id number]  Deborah [name] is a very pleasant 68 y.o. female who presents today for complete physical and follow up of chronic conditions.  She would like assistance with smoking cessation. She is currently smoking 2 packs per day for the last 2 years. She would like to try Chantix . She did well on Chantix  previously and had no side effects.   Immunizations: -Tetanus: Completed in 2019 -Influenza: Declines today, will get next month -Shingles: Completed Shingrix series -Pneumonia: Completed Prevnar 13 in 2018, pneumovax 23  in 2016, prevnar 20 in 2024  Diet: Fair diet.  Exercise: No regular exercise.  Eye exam: Completes annually  Dental exam: Completed years ago   Pap Smear: Overdue, declined for years.  Mammogram: Completed in June 2025 Bone Density Scan: Completed in  Colonoscopy: Completed in 2017, due 2018 but did not complete.  Has yet to complete.  Lung Cancer Screening: CT chest scheduled for October 2025 per oncology.   BP Readings from Last 3 Encounters:  03/21/24 100/68  01/23/24 100/61  01/16/24 99/73   Wt Readings from Last 3 Encounters:  03/21/24 129 lb (58.5 kg)  01/23/24 122 lb (55.3 kg)  01/16/24 122 lb (55.3 kg)      Review of Systems  Constitutional:  Negative for unexpected weight change.  HENT:  Negative for rhinorrhea.   Respiratory:  Positive for shortness of breath. Negative for cough.   Cardiovascular:  Negative for chest pain.  Gastrointestinal:  Negative for constipation and diarrhea.  Genitourinary:  Negative for difficulty urinating.  Musculoskeletal:  Negative for arthralgias and myalgias.  Skin:  Negative for rash.  Allergic/Immunologic: Negative for environmental allergies.  Neurological:  Negative for dizziness and headaches.  Psychiatric/Behavioral:  The patient is not nervous/anxious.          Past Medical History:  Diagnosis Date    Acute hypoxemic respiratory failure (HCC)    Acute on chronic respiratory failure with hypoxia (HCC) 05/24/2020   Anemia    as teen   Asthma    Breast cancer (HCC)    left breast cancer 02/2020   CHF (congestive heart failure) (HCC)    CHF, acute on chronic, unknown EF 07/25/2014   COPD exacerbation (HCC) 12/18/2015   COPD, mild (HCC)    Depression    Diabetes mellitus without complication (HCC)    Diverticulitis    Diverticulosis of colon with hemorrhage    Dyspnea 04/17/2013   Followed in Pulmonary clinic/ [facility]/ [facility]  - hfa 75% p coaching 04/17/2013   - PFT's 06/05/2013 no airflow obst, dlco 60 corrects to 96%     Family history of anesthesia complication    vomiting   GI bleeding    Heart failure (HCC)    New onset 07/25/14   Histoplasmosis    left eye   Hyperkalemia    Hypertension    Lung cancer (HCC) 05/02/2018   s/p chemoradiation, immunotherapy   Obesity (BMI 30-39.9)    Pneumonia    dx wtih pneumonia on 05/27/16- seen by [facility]    PONV (postoperative nausea and vomiting)    Restrictive lung disease    Severe sepsis (HCC)    Shortness of breath    with exertion    Sleep apnea    mask and oxygen  at nite for sleep at 2L    Tobacco abuse    Umbilical hernia  Social History   Socioeconomic History   Marital status: Married    Spouse name: Not on file   Number of children: 1   Years of education: Not on file   Highest education level: 12th grade  Occupational History   Occupation: Retired    Associate Professor: NOT EMPLOYED  Tobacco Use   Smoking status: Former    Current packs/day: 0.00    Average packs/day: 3.0 packs/day for 43.0 years (129.0 ttl pk-yrs)    Types: Cigarettes    Start date: 12/17/1969    Quit date: 12/17/2012    Years since quitting: 11.2   Smokeless tobacco: Never  Vaping Use   Vaping status: Former  Substance and Sexual Activity   Alcohol use: No    Alcohol/week: 0.0 standard drinks of alcohol   Drug use: No   Sexual  activity: Not on file  Other Topics Concern   Not on file  Social History Narrative   ** Merged History Encounter ** ** Data from: 07/25/14 Enc Dept: [facility] ** Data from: 06/05/13 [facility], one son 32 yo.      Watch TV, spend time with family   Social Drivers of Corporate investment banker Strain: Low Risk  (03/17/2024)   Overall Financial Resource Strain (CARDIA)    Difficulty of Paying Living Expenses: Not very hard  Food Insecurity: Food Insecurity Present (03/17/2024)   Hunger Vital Sign    Worried About Running Out of Food in the Last Year: Never true    Ran Out of Food in the Last Year: Sometimes true  Transportation Needs: No Transportation Needs (03/17/2024)   PRAPARE - Administrator, Civil Service (Medical): No    Lack of Transportation (Non-Medical): No  Physical Activity: Inactive (03/17/2024)   Exercise Vital Sign    Days of Exercise per Week: 0 days    Minutes of Exercise per Session: Not on file  Stress: No Stress Concern Present (03/17/2024)   [employer] of Occupational Health - Occupational Stress Questionnaire    Feeling of Stress: Not at all  Social Connections: Moderately Isolated (03/17/2024)   Social Connection and Isolation Panel    Frequency of Communication with Friends and Family: Three times a week    Frequency of Social Gatherings with Friends and Family: Twice a week    Attends Religious Services: Never    Database administrator or Organizations: No    Attends Engineer, structural: Not on file    Marital Status: Married  Catering manager Violence: Not on file    Past Surgical History:  Procedure Laterality Date   APPLICATION OF WOUND VAC  03/19/2013   Procedure: APPLICATION OF WOUND VAC;  Surgeon: Redell Faith, DO;  Location: [facility];  Service: General;;   BREAST BIOPSY Left 09/29/2021   BREAST BIOPSY Left 10/18/2021   BREAST BIOPSY Left 12/24/2023   US  LT BREAST BX W LOC DEV 1ST LESION IMG BX  SPEC US  GUIDE 12/24/2023 [facility]   BREAST BIOPSY  01/22/2024   MM LT RADIOACTIVE SEED LOC MAMMO GUIDE 01/22/2024 [facility]   BREAST BIOPSY Left 01/22/2024   US  LT RADIOACTIVE SEED LOC 01/22/2024 [facility]   BREAST LUMPECTOMY Left 04/08/2020   BREAST LUMPECTOMY Left 08/16/2022   Procedure: LEFT BREAST LUMPECTOMY;  Surgeon: Vanderbilt Ned, MD;  Location: [facility];  Service: General;  Laterality: Left;   BREAST LUMPECTOMY WITH RADIOACTIVE SEED AND AXILLARY LYMPH NODE DISSECTION Left 01/23/2024   Procedure:  BREAST LUMPECTOMY WITH RADIOACTIVE SEED AND AXILLARY LYMPH NODE DISSECTION;  Surgeon: Vanderbilt Ned, MD;  Location: [facility];  Service: General;  Laterality: Left;  GEN w/PEC BLOCK LEFT BREAST SEED LUMPECTOMY LEFT AXILLARY LYMPH NODE DISSECTION   BREAST LUMPECTOMY WITH RADIOACTIVE SEED AND SENTINEL LYMPH NODE BIOPSY Left 04/08/2020   Procedure: LEFT BREAST LUMPECTOMY WITH RADIOACTIVE SEED AND LEFT AXILLARY SENTINEL LYMPH NODE BIOPSY;  Surgeon: Ethyl Lenis, MD;  Location: [facility] OR;  Service: General;  Laterality: Left;  PEC BLOCK, NEEDS ANESTHESIA CONSULT   CESAREAN SECTION  04/12/1985   COLONOSCOPY WITH PROPOFOL  N/A 06/13/2016   Procedure: COLONOSCOPY WITH PROPOFOL ;  Surgeon: Gordy CHRISTELLA Starch, MD;  Location: [facility];  Service: Gastroenterology;  Laterality: N/A;   ERCP N/A 02/15/2017   Procedure: ENDOSCOPIC RETROGRADE CHOLANGIOPANCREATOGRAPHY (ERCP);  Surgeon: Teressa Toribio SQUIBB, MD;  Location: [facility];  Service: Endoscopy;  Laterality: N/A;   EYE SURGERY Left    INSERTION OF MESH N/A 03/19/2013   Procedure: INSERTION OF MESH;  Surgeon: Redell Faith, DO;  Location: [facility];  Service: General;  Laterality: N/A;   IR IMAGING GUIDED PORT INSERTION  03/26/2020   IR THORACENTESIS ASP PLEURAL SPACE W/IMG GUIDE  05/25/2020   VENTRAL HERNIA REPAIR N/A 03/19/2013   Procedure:  OPEN VENTRAL HERNIA REPAIR WITH MESH AND APPLICATION OF WOUND VAC;  Surgeon: Redell Faith, DO;  Location: [facility];  Service: General;  Laterality: N/A;   VIDEO BRONCHOSCOPY Bilateral 04/19/2018   Procedure: VIDEO BRONCHOSCOPY WITH FLUORO;  Surgeon: Jude Harden GAILS, MD;  Location: [facility];  Service: Cardiopulmonary;  Laterality: Bilateral;    Family History  Problem Relation Age of Onset   Heart attack Mother    Emphysema Mother        was a smoker   Congestive Heart Failure Father    Bipolar disorder Father    Lymphoma Maternal Uncle    Pancreatic cancer Maternal Aunt    Bipolar disorder Sister    Schizophrenia Sister    Other Sister        tumor in her neck   Melanoma Sister    Heart attack Maternal Uncle    Heart attack Maternal Uncle    Colon cancer Neg Hx    Breast cancer Neg Hx     No Known Allergies  Current Outpatient Medications on File Prior to Visit  Medication Sig Dispense Refill   acetaminophen  (TYLENOL ) 500 MG tablet Take 500-1,000 mg by mouth every 6 (six) hours as needed for headache (pain).     albuterol  (VENTOLIN  HFA) 108 (90 Base) MCG/ACT inhaler Inhale 2 puffs into the lungs every 6 (six) hours as needed for wheezing or shortness of breath. 8 g 3   anastrozole  (ARIMIDEX ) 1 MG tablet TAKE 1 TABLET BY MOUTH EVERY DAY 90 tablet 3   aspirin  81 MG chewable tablet Chew 1 tablet (81 mg total) by mouth daily. 30 tablet 1   Blood Glucose Monitoring Suppl DEVI 1 each by Does not apply route in the morning, at noon, and at bedtime. May substitute to any manufacturer covered by patient's insurance. 1 each 0   budeson-glycopyrrolate -formoterol  (BREZTRI  AEROSPHERE) 160-9-4.8 MCG/ACT AERO Inhale 2 puffs into the lungs 2 (two) times daily. 1 each 12   Cholecalciferol (VITAMIN D3) 125 MCG (5000 UT) TABS Take 5,000 Units by mouth daily.     CONTOUR NEXT TEST test strip USE AS DIRECTED IN THE MORNING, AT NOON, AND AT BEDTIME TO CHECK BLOOD GLUCOSE LEVLES 300 strip 1  diltiazem  (CARDIZEM  CD) 240 MG 24 hr capsule TAKE 1 CAPSULE BY MOUTH EVERY DAY 90 capsule 2   erythromycin  ophthalmic ointment Place 1 Application into both eyes at bedtime.     furosemide  (LASIX ) 20 MG tablet TAKE 1 AND 1/2 TABLETS DAILY BY MOUTH. 135 tablet 2   ipratropium-albuterol  (DUONEB) 0.5-2.5 (3) MG/3ML SOLN Take 3 mLs by nebulization every 4 (four) hours as needed. 360 mL 11   montelukast  (SINGULAIR ) 10 MG tablet TAKE 1 TABLET (10 MG TOTAL) BY MOUTH AT BEDTIME. FOR ALLERGIES 90 tablet 0   OXYGEN  Inhale 4 L/min into the lungs continuous.     Polyethyl Glycol-Propyl Glycol (SYSTANE ULTRA) 0.4-0.3 % SOLN Place 1-2 drops into both eyes in the morning and at bedtime.     potassium chloride  (KLOR-CON  M) 10 MEQ tablet TAKE 1 TABLET BY MOUTH EVERY DAY 90 tablet 0   rosuvastatin  (CRESTOR ) 5 MG tablet TAKE 1 TABLET BY MOUTH EVERY DAY FOR CHOLESTEROL 90 tablet 0   Semaglutide , 1 MG/DOSE, (OZEMPIC , 1 MG/DOSE,) 4 MG/3ML SOPN INJECT 1 MG INTO THE SKIN ONCE A WEEK. FOR DIABETES. 9 mL 0   sodium chloride  (MURO 128) 5 % ophthalmic ointment Place 1 Application into both eyes at bedtime.     [DISCONTINUED] prochlorperazine  (COMPAZINE ) 10 MG tablet Take 1 tablet (10 mg total) by mouth every 6 (six) hours as needed (Nausea or vomiting). 30 tablet 1   No current facility-administered medications on file prior to visit.    BP 100/68   Pulse (!) 106   Temp (!) 97.2 F (36.2 C) (Temporal)   Ht 5' 3 (1.6 m)   Wt 129 lb (58.5 kg)   SpO2 96% Comment: 2 L of o2  BMI 22.85 kg/m  Objective:   Physical Exam HENT:     Right Ear: Tympanic membrane and ear canal normal.     Left Ear: Tympanic membrane and ear canal normal.  Eyes:     Pupils: Pupils are equal, round, and reactive to light.  Cardiovascular:     Rate and Rhythm: Normal rate and regular rhythm.  Pulmonary:     Effort: Pulmonary effort is normal.     Breath sounds: Examination of the left-upper field reveals wheezing. Examination of the right-lower field reveals wheezing. Wheezing present. No decreased breath sounds or rhonchi.  Abdominal:      General: Bowel sounds are normal.     Palpations: Abdomen is soft.     Tenderness: There is no abdominal tenderness.  Musculoskeletal:        General: Normal range of motion.     Cervical back: Neck supple.  Skin:    General: Skin is warm and dry.  Neurological:     Mental Status: She is alert and oriented to person, place, and time.     Cranial Nerves: No cranial nerve deficit.     Deep Tendon Reflexes:     Reflex Scores:      Patellar reflexes are 2+ on the right side and 2+ on the left side. Psychiatric:        Mood and Affect: Mood normal.     Physical Exam        Assessment & Plan:  Preventative health care Assessment & Plan: Immunizations UTD. Declines influenza vaccine.  She will have this done in October.  Mammogram UTD Colonoscopy overdue, referral placed to GI.  Discussed the importance of a healthy diet and regular exercise in order for weight loss, and to reduce the  risk of further co-morbidity.  Exam stable. Labs reviewed.  Follow up in 1 year for repeat physical.    Screening for colon cancer -     Ambulatory referral to Gastroenterology  Estrogen deficiency -     DG Bone Density; Future  Primary hypertension Assessment & Plan: Controlled.  Continue diltiazem  to 40 mg daily.   Chronic diastolic heart failure (HCC) Assessment & Plan: Appears euvolemic today.  Following with cardiology, office notes reviewed from November 2024.  Continue furosemide .  30 mg daily, diltiazem  to 240 mg daily,   OSA (obstructive sleep apnea) Assessment & Plan: No longer managed on CPAP for patient.   Centrilobular emphysema (HCC) Assessment & Plan: Stable.  Following with pulmonology, office notes reviewed from March 2025.  Continue Breztri  inhaler, 2 puffs twice daily, albuterol  nebulizers as needed.   Chronic respiratory failure with hypoxia [facility]) Assessment & Plan: Follow with pulmonology.  Office notes reviewed from March 2025.  Continue  oxygen  at 2 L continuously. Proceed with CT chest in October as scheduled.   Type 2 diabetes mellitus with hyperglycemia, without long-term current use of insulin  [employer]) Assessment & Plan: Well-controlled.  Reviewed A1c from July 2025.  Continue Ozempic  1 mg weekly per patient request. We did discuss that if she continue to lose weight we may need to back off the dose.  Follow-up in 6 months   Tobacco abuse disorder Assessment & Plan: Ready to quit  Prescription for Chantix  starter pack sent to pharmacy she did well on this previously. She is aware of potential side effects and historically has had no issues.    Orders: -     Varenicline  Tartrate (Starter); Take one 0.5 mg tablet by mouth once daily for 3 days, then increase to one 0.5 mg tablet twice daily for 4 days, then increase to one 1 mg tablet twice daily.  Dispense: 53 each; Refill: 0  Palpitations Assessment & Plan: Controlled.  Continue with diltiazem  240 mg daily.   Malignant neoplasm of upper-outer quadrant of left breast in female, estrogen receptor negative (HCC) Assessment & Plan: Following with oncology.  Office notes reviewed from August 2025. Continue anastrozole  1 mg daily. Mammogram up-to-date   Hyperlipidemia, unspecified hyperlipidemia type Assessment & Plan: Continue rosuvastatin  5 mg daily. She declines lab work today and will have this done next week with her cardiologist.     Assessment and Plan Assessment & Plan         Comer MARLA Gaskins, NP    Discussed the use of AI scribe software for clinical note transcription with the patient, who gave verbal consent to proceed.  History of Present Illness

## 2024-04-04 ENCOUNTER — Ambulatory Visit: Attending: Cardiology | Admitting: Cardiology

## 2024-04-04 ENCOUNTER — Encounter: Payer: Self-pay | Admitting: Cardiology

## 2024-04-04 VITALS — BP 110/62 | HR 109 | Ht 63.0 in | Wt 116.2 lb

## 2024-04-04 DIAGNOSIS — E782 Mixed hyperlipidemia: Secondary | ICD-10-CM

## 2024-04-04 DIAGNOSIS — C50412 Malignant neoplasm of upper-outer quadrant of left female breast: Secondary | ICD-10-CM

## 2024-04-04 DIAGNOSIS — R000 Tachycardia, unspecified: Secondary | ICD-10-CM | POA: Diagnosis not present

## 2024-04-04 DIAGNOSIS — I10 Essential (primary) hypertension: Secondary | ICD-10-CM

## 2024-04-04 DIAGNOSIS — J9611 Chronic respiratory failure with hypoxia: Secondary | ICD-10-CM

## 2024-04-04 DIAGNOSIS — I5032 Chronic diastolic (congestive) heart failure: Secondary | ICD-10-CM

## 2024-04-04 DIAGNOSIS — Z171 Estrogen receptor negative status [ER-]: Secondary | ICD-10-CM

## 2024-04-04 NOTE — Patient Instructions (Signed)
 Medication Instructions:  Your physician recommends that you continue on your current medications as directed. Please refer to the Current Medication list given to you today.  *If you need a refill on your cardiac medications before your next appointment, please call your pharmacy*  Lab Work: No labs ordered today  If you have labs (blood work) drawn today and your tests are completely normal, you will receive your results only by: MyChart Message (if you have MyChart) OR A paper copy in the mail If you have any lab test that is abnormal or we need to change your treatment, we will call you to review the results.  Testing/Procedures: No test ordered today   Follow-Up: At [facility], you and your health needs are our priority.  As part of our continuing mission to provide you with exceptional heart care, our providers are all part of one team.  This team includes your primary Cardiologist (physician) and Advanced Practice Providers or APPs (Physician Assistants and Nurse Practitioners) who all work together to provide you with the care you need, when you need it.  Your next appointment:   6 month(s)  Provider:   You may see Lorine Bears, MD or one of the following Advanced Practice Providers on your designated Care Team:   Nicolasa Ducking, NP Ames Dura, PA-C Eula Listen, PA-C Cadence Woden, PA-C Charlsie Quest, NP Carlos Levering, NP

## 2024-04-04 NOTE — Progress Notes (Signed)
 Cardiology Office Note   Date:  04/04/2024  ID:  Deborah [name], DOB Nov 24, 1955, MRN [id number] PCP: Gretta Comer POUR, NP  [facility] Providers Cardiologist:  Deatrice Cage, MD     History of Present Illness Deborah [name] is a 68 y.o. female with a past medical history of chronic diastolic congestive heart failure, chronic right-sided artery, COPD, oxygen  dependence, prior tobacco use, hypertension, obesity, bipolar disorder, history of breast cancer, pulmonary hypertension, obstructive sleep apnea, type 2 diabetes, tobacco use disorder, morbid obesity, is here today for follow-up.   She previously been admitted 07/2014 for CHF.  Moderate left pleural effusion.  Echocardiogram revealed EF 50-25%, G1 DD, dilated RV with hypokinesis and mild pulm hypertension.  VQ scan showed low probability of PE.  She subsequently quit smoking.  She was diagnosed with squamous cell carcinoma of the lung stage IIIb 07/2017.  She had a large left lower lobe mass in addition to mediastinal lymphadenopathy.  She was treated with radiation, chemotherapy, and immunotherapy.  Subsequent echocardiogram revealed an EF 55 to 60%, no RWMA, RVSP 33 mmHg.  Heart monitor 2022 showed predominantly normal sinus rhythm with average heart rates in the 90s, prescribed block, second-degree AV block with brief SVTs and rare PVCs.  10/2018 she was doing well from a cardiac perspective and reported recurrent breast cancer.  Heart monitor in April 2023 revealed an average heart rate of 91 bpm, underlying was sinus with first-degree AV block, 5 supraventricular tachycardia runs winky block was present and isolated rare PVCs.  Echocardiogram was repeated and revealed LVEF 55 to 60%, no RWMA, G1 DD, mild MR.  Event monitor was also placed.  She was evaluated in clinic 04/16/2023 accompanied by her husband.  Continued chronic shortness of breath maintained on chronic oxygen  therapy.  She had been going to pulmonary rehab with  walking her heart rate was increasing to approximately 160 bpm.  They completed an EKG encouraged to follow-up with cardiology that she was likely in atrial fibrillation or atrial flutter.  She was concerned that she was advised she was in A-fib or flutter she may need to go on anticoagulant medication.SABRA  She was scheduled for follow-up echocardiogram and a ZIO XT monitor for 2 weeks to rule out atrial fibrillation or atrial flutter.   She was last seen in clinic 06/01/2023 accompanied by her husband.  Stated she had been having atypical chest discomfort on the left side of her chest and sharp stabbing reproducible type pain with movement.  She had a cough that was productive on occasion.  States she recently chest CT which showed small left pleural effusion stable left perihilar postradiation changes.  She also had bronchiectasis in the right lower lobe and numerous tree-in-bud nodules.  ZIO monitor revealed atrial tachycardia and SVT without evidence of atrial fibrillation.  She returns to clinic today accompanied by her husband.  Recently undergone surgery again for recurrent left breast cancer and removal of the left axillary lymph node.  She continues to be in a wheelchair.  Maintains chronic shortness of breath on chronic oxygen  therapy.  States that she has been compliant with her current medication regimen without any undue side effects.  ROS: 10 point review of system is reviewed and considered negative the exception was been listed in the HPI  Studies Reviewed EKG Interpretation Date/Time:  Friday April 04 2024 11:16:27 EDT Ventricular Rate:  109 PR Interval:  174 QRS Duration:  86 QT Interval:  338 QTC Calculation: 455 R Axis:  56  Text Interpretation: Sinus tachycardia No significant change was found Confirmed by Deborah [name] ([id number]) on 04/04/2024 11:19:13 AM    Event Monitor 05/14/23 Patient had a min HR of 56 bpm, max HR of 171 bpm, and avg HR of 93 bpm. Predominant  underlying rhythm was Sinus Rhythm. First Degree AV Block was present. 1 run of Supraventricular Tachycardia occurred lasting 15 beats with a max rate of 171 bpm (avg  153 bpm). Episode of Supraventricular Tachycardia may be possible Atrial Tachycardia with variable block. Isolated SVEs were rare (<1.0%), SVE Couplets were rare (<1.0%), and no SVE Triplets were present. Isolated VEs were rare (<1.0%), VE Couplets were  rare (<1.0%), and no VE Triplets were present.   2D echo 05/09/23 1. Left ventricular ejection fraction, by estimation, is 55 to 60%. The  left ventricle has normal function. The left ventricle has no regional  wall motion abnormalities. Left ventricular diastolic parameters are  consistent with Grade I diastolic  dysfunction (impaired relaxation).   2. Right ventricular systolic function is normal. The right ventricular  size is normal. Tricuspid regurgitation signal is inadequate for assessing  PA pressure.   3. The mitral valve is normal in structure. Mild mitral valve  regurgitation. No evidence of mitral stenosis.   4. The aortic valve has an indeterminant number of cusps. Aortic valve  regurgitation is not visualized. No aortic stenosis is present.   5. The inferior vena cava is normal in size with greater than 50%  respiratory variability, suggesting right atrial pressure of 3 mmHg.    Heart monitor 10/2021 Patient had a min HR of 53 bpm, max HR of 197 bpm, and avg HR of 91 bpm.  Predominant underlying rhythm was Sinus Rhythm. First Degree AV Block was present.  5 Supraventricular Tachycardia runs occurred, the run with the fastest interval lasting 8 beats with a max rate of 197 bpm, the longest lasting 16 beats with an avg rate of 121 bpm. Second Degree AV Block-Mobitz I (Wenckebach) was present. Isolated Rare PVCs.   Echo 05/25/20  1. Left ventricular ejection fraction, by estimation, is 55 to 60%. The  left ventricle has normal function. The left ventricle has no  regional  wall motion abnormalities. Indeterminate diastolic filling due to E-A  fusion.   2. Right ventricular systolic function is normal. The right ventricular  size is normal. There is normal pulmonary artery systolic pressure. The  estimated right ventricular systolic pressure is 35.0 mmHg.   3. The mitral valve is normal in structure. No evidence of mitral valve  regurgitation. No evidence of mitral stenosis.   4. The aortic valve is normal in structure. Aortic valve regurgitation is  not visualized. No aortic stenosis is present.   5. The inferior vena cava is normal in size with greater than 50%  respiratory variability, suggesting right atrial pressure of 3 mmHg.  Comparison(s): No significant change from prior study. Prior images  reviewed side by side.  Risk Assessment/Calculations           Physical Exam VS:  BP 110/62 (BP Location: Left Arm, Patient Position: Sitting, Cuff Size: Normal)   Pulse (!) 109   Ht 5' 3 (1.6 m)   Wt 116 lb 3.2 oz (52.7 kg)   SpO2 96%   BMI 20.58 kg/m        Wt Readings from Last 3 Encounters:  04/04/24 116 lb 3.2 oz (52.7 kg)  03/21/24 129 lb (58.5 kg)  01/23/24 122 lb (55.3 kg)  GEN: Frail chronically ill-appearing NECK: No JVD; No carotid bruits CARDIAC: RRR, tachycardia, no murmurs, rubs, gallops RESPIRATORY:  Clear with diminished bases and forced expiratory wheezing to auscultation, respirations are unlabored at rest on 2 L of O2 via nasal cannula ABDOMEN: Soft, non-tender, non-distended EXTREMITIES:  No edema; No deformity   ASSESSMENT AND PLAN Chronic HFpEF with last echocardiogram revealed an LVEF 55 to 60%, no RWMA, mild MR.  She continues to remain euvolemic on exam.  She suffers from NYHA class II-III symptoms constantly.  She is continued on furosemide  30 mg daily.  She has chronic stable dyspnea on exertion and shortness of breath.  She does not have a candidate for SGLT2 inhibitor due to recurrent wheelchair use and  sedentary lifestyle.   Sinus tachycardia with EKG today revealing sinus tach with a rate of 109.  Prior ZIO XT monitor showed an average heart rate of 93 bpm with an episode of SVT.  She has been continued on Cardizem  240 mg daily.  Chronic respiratory failure with COPD on chronic oxygen  of 2 to 4 L.  Continues to be managed by pulmonary.  Hyperlipidemia prior LDL of 45 she has upcoming lab work with her PCP.  She is continued on rosuvastatin  5 mg daily previously had been at goal.  Hypertension with a blood pressure of 110/62.  Blood pressure remains stable.  She is continued on her diltiazem .  She has been encouraged to continue to monitor pressure 1 to 2 hours postmedication administration at home as well.  Left breast cancer with reoccurrence and she had previously been treated with radiation and chemotherapy and immunotherapy.  She underwent an additional lumpectomy and the removal of lymph nodes.  Ongoing management per oncology.       Dispo: Patient to return to clinic see MD/APP in 6 months or sooner if needed for further evaluation  Signed, Granvel Proudfoot, NP

## 2024-04-05 ENCOUNTER — Other Ambulatory Visit: Payer: Self-pay | Admitting: Primary Care

## 2024-04-05 DIAGNOSIS — I5032 Chronic diastolic (congestive) heart failure: Secondary | ICD-10-CM

## 2024-04-07 ENCOUNTER — Other Ambulatory Visit: Payer: Self-pay | Admitting: Primary Care

## 2024-04-07 DIAGNOSIS — I700 Atherosclerosis of aorta: Secondary | ICD-10-CM

## 2024-04-07 DIAGNOSIS — J984 Other disorders of lung: Secondary | ICD-10-CM

## 2024-04-07 DIAGNOSIS — E785 Hyperlipidemia, unspecified: Secondary | ICD-10-CM

## 2024-04-28 ENCOUNTER — Ambulatory Visit: Admitting: Pulmonary Disease

## 2024-05-04 ENCOUNTER — Other Ambulatory Visit: Payer: Self-pay | Admitting: Primary Care

## 2024-05-04 DIAGNOSIS — Z720 Tobacco use: Secondary | ICD-10-CM

## 2024-05-08 ENCOUNTER — Inpatient Hospital Stay

## 2024-05-12 ENCOUNTER — Inpatient Hospital Stay: Attending: Internal Medicine

## 2024-05-12 ENCOUNTER — Other Ambulatory Visit

## 2024-05-12 ENCOUNTER — Ambulatory Visit (HOSPITAL_COMMUNITY)
Admission: RE | Admit: 2024-05-12 | Discharge: 2024-05-12 | Disposition: A | Source: Ambulatory Visit | Attending: Internal Medicine | Admitting: Internal Medicine

## 2024-05-12 DIAGNOSIS — C3490 Malignant neoplasm of unspecified part of unspecified bronchus or lung: Secondary | ICD-10-CM | POA: Diagnosis not present

## 2024-05-12 DIAGNOSIS — Z85118 Personal history of other malignant neoplasm of bronchus and lung: Secondary | ICD-10-CM | POA: Diagnosis not present

## 2024-05-12 DIAGNOSIS — Z79811 Long term (current) use of aromatase inhibitors: Secondary | ICD-10-CM | POA: Insufficient documentation

## 2024-05-12 DIAGNOSIS — C50912 Malignant neoplasm of unspecified site of left female breast: Secondary | ICD-10-CM | POA: Diagnosis not present

## 2024-05-12 DIAGNOSIS — Z170 Estrogen receptor positive status [ER+]: Secondary | ICD-10-CM | POA: Insufficient documentation

## 2024-05-12 LAB — CMP (CANCER CENTER ONLY)
ALT: 7 U/L (ref 0–44)
AST: 11 U/L — ABNORMAL LOW (ref 15–41)
Albumin: 3.5 g/dL (ref 3.5–5.0)
Alkaline Phosphatase: 82 U/L (ref 38–126)
Anion gap: 7 (ref 5–15)
BUN: 12 mg/dL (ref 8–23)
CO2: 30 mmol/L (ref 22–32)
Calcium: 9.3 mg/dL (ref 8.9–10.3)
Chloride: 98 mmol/L (ref 98–111)
Creatinine: 0.52 mg/dL (ref 0.44–1.00)
GFR, Estimated: >60 mL/min (ref >60)
Glucose, Bld: 95 mg/dL (ref 70–99)
Potassium: 3.9 mmol/L (ref 3.5–5.1)
Sodium: 135 mmol/L (ref 135–145)
Total Bilirubin: 0.3 mg/dL (ref 0.0–1.2)
Total Protein: 6.9 g/dL (ref 6.5–8.1)

## 2024-05-12 LAB — CBC WITH DIFFERENTIAL (CANCER CENTER ONLY)
Abs Immature Granulocytes: 0.04 K/uL (ref 0.00–0.07)
Basophils Absolute: 0.1 K/uL (ref 0.0–0.1)
Basophils Relative: 0 %
Eosinophils Absolute: 0.1 K/uL (ref 0.0–0.5)
Eosinophils Relative: 1 %
HCT: 27.6 % — ABNORMAL LOW (ref 36.0–46.0)
Hemoglobin: 8.6 g/dL — ABNORMAL LOW (ref 12.0–15.0)
Immature Granulocytes: 0 %
Lymphocytes Relative: 11 %
Lymphs Abs: 1.2 K/uL (ref 0.7–4.0)
MCH: 23.1 pg — ABNORMAL LOW (ref 26.0–34.0)
MCHC: 31.2 g/dL (ref 30.0–36.0)
MCV: 74 fL — ABNORMAL LOW (ref 80.0–100.0)
Monocytes Absolute: 0.8 K/uL (ref 0.1–1.0)
Monocytes Relative: 7 %
Neutro Abs: 9.5 K/uL — ABNORMAL HIGH (ref 1.7–7.7)
Neutrophils Relative %: 81 %
Platelet Count: 333 K/uL (ref 150–400)
RBC: 3.73 MIL/uL — ABNORMAL LOW (ref 3.87–5.11)
RDW: 16.2 % — ABNORMAL HIGH (ref 11.5–15.5)
WBC Count: 11.7 K/uL — ABNORMAL HIGH (ref 4.0–10.5)
nRBC: 0 % (ref 0.0–0.2)

## 2024-05-12 MED ORDER — IOHEXOL 300 MG/ML  SOLN
75.0000 mL | Freq: Once | INTRAMUSCULAR | Status: AC | PRN
Start: 2024-05-12 — End: 2024-05-12
  Administered 2024-05-12: 75 mL via INTRAVENOUS

## 2024-05-12 MED ORDER — HEPARIN SOD (PORK) LOCK FLUSH 100 UNIT/ML IV SOLN
500.0000 [IU] | Freq: Once | INTRAVENOUS | Status: AC
  Administered 2024-05-12: 500 [IU] via INTRAVENOUS

## 2024-05-17 ENCOUNTER — Other Ambulatory Visit: Payer: Self-pay | Admitting: Primary Care

## 2024-05-17 DIAGNOSIS — E1165 Type 2 diabetes mellitus with hyperglycemia: Secondary | ICD-10-CM

## 2024-05-19 ENCOUNTER — Inpatient Hospital Stay: Attending: Internal Medicine | Admitting: Internal Medicine

## 2024-05-19 VITALS — BP 99/60 | HR 108 | Temp 97.8°F | Resp 17 | Ht 63.0 in | Wt 117.0 lb

## 2024-05-19 DIAGNOSIS — Z9981 Dependence on supplemental oxygen: Secondary | ICD-10-CM | POA: Insufficient documentation

## 2024-05-19 DIAGNOSIS — Z79811 Long term (current) use of aromatase inhibitors: Secondary | ICD-10-CM | POA: Diagnosis not present

## 2024-05-19 DIAGNOSIS — C50412 Malignant neoplasm of upper-outer quadrant of left female breast: Secondary | ICD-10-CM | POA: Insufficient documentation

## 2024-05-19 DIAGNOSIS — Z85118 Personal history of other malignant neoplasm of bronchus and lung: Secondary | ICD-10-CM | POA: Diagnosis not present

## 2024-05-19 DIAGNOSIS — Z720 Tobacco use: Secondary | ICD-10-CM | POA: Insufficient documentation

## 2024-05-19 DIAGNOSIS — C3490 Malignant neoplasm of unspecified part of unspecified bronchus or lung: Secondary | ICD-10-CM

## 2024-05-19 NOTE — Progress Notes (Signed)
 [facility] [phone]   [phone]  OFFICE PROGRESS NOTE  Deborah Comer POUR, NP [address] Deborah BRAVO [name] [id number]  DIAGNOSIS:  1) stage IIIB (T3, N2, M0) non-small cell lung cancer, squamous cell carcinoma presented with large left lower lobe lung mass in addition to mediastinal lymphadenopathy diagnosed in October 2019. PDL 1 expression is negative 2) recurrent breast cancer initially diagnosed as stage IIb (T2, N0, M0) left breast invasive ductal carcinoma involving the upper outer quadrant of the left breast diagnosed in August 2021.  PRIOR THERAPY:  1) Concurrent chemoradiation with weekly carboplatin  for AUC of 2 and paclitaxel  45 mg/M2.  Status post 7 cycles with partial response. 2) Consolidation treatment with immunotherapy with Imfinzi  10 mg/KG every 2 weeks.  First dose July 31, 2018.  Status post 26 cycles. 3) status post left lumpectomy followed by adjuvant chemotherapy with Adriamycin , Cytoxan  and Taxol   CURRENT THERAPY: Arimidex  1 mg p.o. daily under the care of Dr. Gudena started on November 11, 2021  INTERVAL HISTORY: Deborah [name] 68 y.o. female returns to the clinic today for follow-up visit accompanied by her husband.Discussed the use of AI scribe software for clinical note transcription with the patient, who gave verbal consent to proceed.  History of Present Illness Deborah [name] is a 68 year old female with squamous cell carcinoma and recurrent breast cancer who presents for evaluation and repeat CT scan of the chest for restaging of her disease. She is accompanied by her husband.  She was diagnosed with squamous cell carcinoma in October 2019 and has undergone treatment with carboplatin  and paclitaxel , followed by durvalumab  for one year.  She has a history of stage 2B recurrent breast cancer of the left breast, which was treated with lumpectomy followed by adjuvant chemotherapy with idramycin, cytoxan , and  paclitaxel . She is currently on anastrozole  1 mg PO daily.  She reports significant weight loss, stating she has 'halved' her weight since her last visit, now weighing 123 pounds. She feels great overall but mentions not feeling completely well. She uses oxygen  at home, 4 liters, but forgot to turn it on during the visit.  She mentions her upcoming birthday plans, which include spending time with her grandchildren and eating chocolate cake.     MEDICAL HISTORY: Past Medical History:  Diagnosis Date   Acute hypoxemic respiratory failure (HCC)    Acute on chronic respiratory failure with hypoxia (HCC) 05/24/2020   Anemia    as teen   Asthma    Breast cancer (HCC)    left breast cancer 02/2020   CHF (congestive heart failure) (HCC)    CHF, acute on chronic, unknown EF 07/25/2014   COPD exacerbation (HCC) 12/18/2015   COPD, mild (HCC)    Depression    Diabetes mellitus without complication (HCC)    Diverticulitis    Diverticulosis of colon with hemorrhage    Dyspnea 04/17/2013   Followed in Pulmonary clinic/ [facility]/ [facility]  - hfa 75% p coaching 04/17/2013   - PFT's 06/05/2013 no airflow obst, dlco 60 corrects to 96%     Family history of anesthesia complication    vomiting   GI bleeding    Heart failure (HCC)    New onset 07/25/14   Histoplasmosis    left eye   Hyperkalemia    Hypertension    Lung cancer (HCC) 05/02/2018   s/p chemoradiation, immunotherapy   Obesity (BMI 30-39.9)    Pneumonia  dx wtih pneumonia on 05/27/16- seen by [facility]    PONV (postoperative nausea and vomiting)    Restrictive lung disease    Severe sepsis (HCC)    Shortness of breath    with exertion    Sleep apnea    mask and oxygen  at nite for sleep at 2L    Tobacco abuse    Umbilical hernia     ALLERGIES:  has no known allergies.  MEDICATIONS:  Current Outpatient Medications  Medication Sig Dispense Refill   acetaminophen  (TYLENOL ) 500 MG tablet Take 500-1,000 mg by mouth every  6 (six) hours as needed for headache (pain).     albuterol  (VENTOLIN  HFA) 108 (90 Base) MCG/ACT inhaler Inhale 2 puffs into the lungs every 6 (six) hours as needed for wheezing or shortness of breath. 8 g 3   anastrozole  (ARIMIDEX ) 1 MG tablet TAKE 1 TABLET BY MOUTH EVERY DAY 90 tablet 3   aspirin  81 MG chewable tablet Chew 1 tablet (81 mg total) by mouth daily. 30 tablet 1   Blood Glucose Monitoring Suppl DEVI 1 each by Does not apply route in the morning, at noon, and at bedtime. May substitute to any manufacturer covered by patient's insurance. 1 each 0   budeson-glycopyrrolate -formoterol  (BREZTRI  AEROSPHERE) 160-9-4.8 MCG/ACT AERO Inhale 2 puffs into the lungs 2 (two) times daily. 1 each 12   Cholecalciferol (VITAMIN D3) 125 MCG (5000 UT) TABS Take 5,000 Units by mouth daily.     CONTOUR NEXT TEST test strip USE AS DIRECTED IN THE MORNING, AT NOON, AND AT BEDTIME TO CHECK BLOOD GLUCOSE LEVLES 300 strip 1   diltiazem  (CARDIZEM  CD) 240 MG 24 hr capsule TAKE 1 CAPSULE BY MOUTH EVERY DAY 90 capsule 2   furosemide  (LASIX ) 20 MG tablet TAKE 1 AND 1/2 TABLETS DAILY BY MOUTH. 135 tablet 2   ipratropium-albuterol  (DUONEB) 0.5-2.5 (3) MG/3ML SOLN Take 3 mLs by nebulization every 4 (four) hours as needed. 360 mL 11   montelukast  (SINGULAIR ) 10 MG tablet TAKE 1 TABLET (10 MG TOTAL) BY MOUTH AT BEDTIME. FOR ALLERGIES 90 tablet 2   OXYGEN  Inhale 4 L/min into the lungs continuous.     Polyethyl Glycol-Propyl Glycol (SYSTANE ULTRA) 0.4-0.3 % SOLN Place 1-2 drops into both eyes in the morning and at bedtime.     potassium chloride  (KLOR-CON  M) 10 MEQ tablet TAKE 1 TABLET BY MOUTH EVERY DAY 90 tablet 2   rosuvastatin  (CRESTOR ) 5 MG tablet TAKE 1 TABLET BY MOUTH EVERY DAY FOR CHOLESTEROL 90 tablet 2   Semaglutide , 1 MG/DOSE, (OZEMPIC , 1 MG/DOSE,) 4 MG/3ML SOPN INJECT 1 MG INTO THE SKIN ONCE A WEEK. FOR DIABETES. 9 mL 1   sodium chloride  (MURO 128) 5 % ophthalmic ointment Place 1 Application into both eyes at  bedtime.     varenicline  (CHANTIX  CONTINUING MONTH PAK) 1 MG tablet Take 1 tablet (1 mg total) by mouth 2 (two) times daily. 60 tablet 0   erythromycin ophthalmic ointment Place 1 Application into both eyes at bedtime.     No current facility-administered medications for this visit.    SURGICAL HISTORY:  Past Surgical History:  Procedure Laterality Date   APPLICATION OF WOUND VAC  03/19/2013   Procedure: APPLICATION OF WOUND VAC;  Surgeon: Redell Faith, DO;  Location: [facility];  Service: General;;   BREAST BIOPSY Left 09/29/2021   BREAST BIOPSY Left 10/18/2021   BREAST BIOPSY Left 12/24/2023   US  LT BREAST BX W LOC DEV 1ST LESION IMG BX  SPEC US  GUIDE 12/24/2023 [facility]   BREAST BIOPSY  01/22/2024   MM LT RADIOACTIVE SEED LOC MAMMO GUIDE 01/22/2024 [facility]   BREAST BIOPSY Left 01/22/2024   US  LT RADIOACTIVE SEED LOC 01/22/2024 [facility]   BREAST LUMPECTOMY Left 04/08/2020   BREAST LUMPECTOMY Left 08/16/2022   Procedure: LEFT BREAST LUMPECTOMY;  Surgeon: Vanderbilt Ned, MD;  Location: [facility];  Service: General;  Laterality: Left;   BREAST LUMPECTOMY WITH RADIOACTIVE SEED AND AXILLARY LYMPH NODE DISSECTION Left 01/23/2024   Procedure: BREAST LUMPECTOMY WITH RADIOACTIVE SEED AND AXILLARY LYMPH NODE DISSECTION;  Surgeon: Vanderbilt Ned, MD;  Location: [facility];  Service: General;  Laterality: Left;  GEN w/PEC BLOCK LEFT BREAST SEED LUMPECTOMY LEFT AXILLARY LYMPH NODE DISSECTION   BREAST LUMPECTOMY WITH RADIOACTIVE SEED AND SENTINEL LYMPH NODE BIOPSY Left 04/08/2020   Procedure: LEFT BREAST LUMPECTOMY WITH RADIOACTIVE SEED AND LEFT AXILLARY SENTINEL LYMPH NODE BIOPSY;  Surgeon: Ethyl Lenis, MD;  Location: [facility] OR;  Service: General;  Laterality: Left;  PEC BLOCK, NEEDS ANESTHESIA CONSULT   CESAREAN SECTION  04/12/1985   COLONOSCOPY WITH PROPOFOL  N/A 06/13/2016   Procedure: COLONOSCOPY WITH PROPOFOL ;  Surgeon: Gordy CHRISTELLA Starch, MD;  Location: [facility];  Service: Gastroenterology;   Laterality: N/A;   ERCP N/A 02/15/2017   Procedure: ENDOSCOPIC RETROGRADE CHOLANGIOPANCREATOGRAPHY (ERCP);  Surgeon: Teressa Toribio SQUIBB, MD;  Location: [facility];  Service: Endoscopy;  Laterality: N/A;   EYE SURGERY Left    INSERTION OF MESH N/A 03/19/2013   Procedure: INSERTION OF MESH;  Surgeon: Redell Faith, DO;  Location: [facility];  Service: General;  Laterality: N/A;   IR IMAGING GUIDED PORT INSERTION  03/26/2020   IR THORACENTESIS ASP PLEURAL SPACE W/IMG GUIDE  05/25/2020   VENTRAL HERNIA REPAIR N/A 03/19/2013   Procedure:  OPEN VENTRAL HERNIA REPAIR WITH MESH AND APPLICATION OF WOUND VAC;  Surgeon: Redell Faith, DO;  Location: [facility];  Service: General;  Laterality: N/A;   VIDEO BRONCHOSCOPY Bilateral 04/19/2018   Procedure: VIDEO BRONCHOSCOPY WITH FLUORO;  Surgeon: Jude Harden GAILS, MD;  Location: [facility];  Service: Cardiopulmonary;  Laterality: Bilateral;    REVIEW OF SYSTEMS:  A comprehensive review of systems was negative except for: Constitutional: positive for fatigue and weight loss Respiratory: positive for cough and dyspnea on exertion   PHYSICAL EXAMINATION: General appearance: alert, cooperative, fatigued, and no distress Head: Normocephalic, without obvious abnormality, atraumatic Neck: no adenopathy, no JVD, supple, symmetrical, trachea midline, and thyroid  not enlarged, symmetric, no tenderness/mass/nodules Lymph nodes: Cervical, supraclavicular, and axillary nodes normal. Resp: clear to auscultation bilaterally Back: symmetric, no curvature. ROM normal. No CVA tenderness. Cardio: regular rate and rhythm, S1, S2 normal, no murmur, click, rub or gallop GI: soft, non-tender; bowel sounds normal; no masses,  no organomegaly Extremities: extremities normal, atraumatic, no cyanosis or edema  ECOG PERFORMANCE STATUS: 1 - Symptomatic but completely ambulatory  Blood pressure 99/60, pulse (!) 108, temperature 97.8 F (36.6 C), temperature source Temporal, resp. rate 17,  height 5' 3 (1.6 m), weight 117 lb (53.1 kg), SpO2 96%.  LABORATORY DATA: Lab Results  Component Value Date   WBC 11.7 (H) 05/12/2024   HGB 8.6 (L) 05/12/2024   HCT 27.6 (L) 05/12/2024   MCV 74.0 (L) 05/12/2024   PLT 333 05/12/2024      Chemistry      Component Value Date/Time   NA 135 05/12/2024 1541   NA 143 11/23/2016 1504   K 3.9 05/12/2024 1541   CL 98 05/12/2024 1541  CO2 30 05/12/2024 1541   BUN 12 05/12/2024 1541   BUN 14 11/23/2016 1504   CREATININE 0.52 05/12/2024 1541      Component Value Date/Time   CALCIUM  9.3 05/12/2024 1541   ALKPHOS 82 05/12/2024 1541   AST 11 (L) 05/12/2024 1541   ALT 7 05/12/2024 1541   BILITOT 0.3 05/12/2024 1541       RADIOGRAPHIC STUDIES: CT Chest W Contrast Result Date: 05/15/2024 CLINICAL DATA:  Non-small cell lung cancer, staging. * Tracking Code: BO * EXAM: CT CHEST WITH CONTRAST TECHNIQUE: Multidetector CT imaging of the chest was performed during intravenous contrast administration. RADIATION DOSE REDUCTION: This exam was performed according to the departmental dose-optimization program which includes automated exposure control, adjustment of the mA and/or kV according to patient size and/or use of iterative reconstruction technique. CONTRAST:  75mL OMNIPAQUE  IOHEXOL  300 MG/ML  SOLN COMPARISON:  Chest CT 11/16/2023, 06/15/2023 and 05/21/2023. FINDINGS: Cardiovascular: No acute vascular findings are demonstrated. Right IJ Port-A-Cath extends into the right atrium. There is atherosclerosis of the aorta, great vessels and coronary arteries. The heart size is normal. There is no pericardial effusion. Mediastinum/Nodes: There are no enlarged mediastinal, hilar or axillary lymph nodes. Interval left axillary node dissection.Stable small mediastinal lymph nodes. Calcified right hilar lymph nodes are noted. The thyroid  gland, trachea and esophagus demonstrate no significant findings. Lungs/Pleura: Stable chronic small left pleural effusion  with associated scarring and volume loss posteriorly in the left hemithorax. Underlying moderate centrilobular and paraseptal emphysema with mild diffuse central airway thickening and cylindrical bronchiectasis. No confluent airspace disease or suspicious pulmonary nodularity. Upper abdomen: The visualized upper abdomen appears stable, without significant findings. There are scattered calcified granulomas in the liver and spleen. Aortic and branch vessel atherosclerosis noted. Musculoskeletal/Chest wall: There is no chest wall mass or suspicious osseous finding. Postsurgical changes in the left breast. Chronic posterior left rib deformities, attributed to prior radiation therapy. IMPRESSION: 1. Interval postsurgical changes in the left breast and left axilla. 2. Otherwise stable chest CT without evidence of local recurrence or metastatic disease. 3. Stable chronic small left pleural effusion with associated scarring and volume loss in the left hemithorax, attributed to prior radiation. 4. Aortic Atherosclerosis (ICD10-I70.0) and Emphysema (ICD10-J43.9). Electronically Signed   By: Elsie Perone M.D.   On: 05/15/2024 13:20     ASSESSMENT AND PLAN: This is a very pleasant 68 years old white female with stage IIIB non-small cell lung cancer, squamous cell carcinoma. She underwent a course of concurrent chemoradiation with weekly carboplatin  and paclitaxel , status post 7 cycles.  She tolerated this treatment well with no concerning adverse effect except for fatigue. She underwent consolidation treatment with immunotherapy with Imfinzi  status post 26 cycles. The patient was also diagnosed with a stage IIb left breast cancer s/p lumpectomy followed by adjuvant systemic chemotherapy with Adriamycin , Cytoxan  and Taxol .  She was followed by Dr. Gudena at that time.  She was recently found to have recurrence of her disease and she is started on treatment with anastrozole  1 mgp.o. daily by Dr. Gudena on  11/11/2021. She had repeat CT scan of the chest performed recently.  I personally independently reviewed the scan and discussed the result with the patient and her husband.  Her scan showed no concerning findings for disease progression. Assessment and Plan Assessment & Plan Squamous cell carcinoma of lung, under surveillance Squamous cell carcinoma of the lung, initially diagnosed in October 2019. Currently under surveillance with no evidence of disease progression on recent CT  scan. No new growths or metastasis observed. - Continue surveillance with CT scans every six months.  Recurrent breast cancer of the left breast, on anastrozole  Stage 2B recurrent breast cancer of the left breast, treated with lumpectomy and adjuvant chemotherapy. Currently on anastrozole  1 mg daily. No indication for change in current treatment regimen as all aromatase inhibitors are considered equally effective. - Continue anastrozole  1 mg daily. She was advised to call immediately if she has any other concerning symptoms in the interval.  The patient voices understanding of current disease status and treatment options and is in agreement with the current care plan.  All questions were answered. The patient knows to call the clinic with any problems, questions or concerns. We can certainly see the patient much sooner if necessary.  Disclaimer: This note was dictated with voice recognition software. Similar sounding words can inadvertently be transcribed and may not be corrected upon review.

## 2024-05-21 ENCOUNTER — Telehealth: Payer: Self-pay | Admitting: Internal Medicine

## 2024-05-21 NOTE — Telephone Encounter (Signed)
 Scheduled patient for next appointments. Called and left a voicemail with the details.

## 2024-05-27 ENCOUNTER — Other Ambulatory Visit: Payer: Self-pay | Admitting: Primary Care

## 2024-05-27 DIAGNOSIS — Z720 Tobacco use: Secondary | ICD-10-CM

## 2024-05-27 NOTE — Telephone Encounter (Signed)
 Please call patient:  Is she still taking Chantix  for smoking? Did she stop smoking? If not, does she feel like she needs another refill.

## 2024-05-28 NOTE — Telephone Encounter (Signed)
 Spoke with pt asking about Chantix . Pt confirms she is still taking Chantix . First states she stopped smoking but then changed to she takes a few puffs every now and then. Requests refill for Chantix .

## 2024-05-28 NOTE — Telephone Encounter (Signed)
Noted. Refill(s) sent to pharmacy.  

## 2024-06-04 ENCOUNTER — Ambulatory Visit (INDEPENDENT_AMBULATORY_CARE_PROVIDER_SITE_OTHER): Admitting: Pulmonary Disease

## 2024-06-04 ENCOUNTER — Encounter: Payer: Self-pay | Admitting: Pulmonary Disease

## 2024-06-04 VITALS — BP 94/66 | HR 114 | Temp 98.3°F | Ht 63.0 in | Wt 121.0 lb

## 2024-06-04 DIAGNOSIS — J432 Centrilobular emphysema: Secondary | ICD-10-CM | POA: Diagnosis not present

## 2024-06-04 NOTE — Progress Notes (Signed)
 @Patient  ID: Deborah [name], female    DOB: 1956/01/30, 68 y.o.   MRN: [id number]  Chief Complaint  Patient presents with  . Shortness of Breath    Patient states her breathing is the same.    Referring provider: Gretta Comer POUR, NP  HPI:  68 y.o. whom we are seeing for evaluation of COPD and chronic hypoxemic respiratory failure.  Most recent PCP note reviewed.  Doing well.  Asthma exacerbation at last visit.  But since then doing well.  No exacerbations.  Resumed Breztri .  Doing well with this.  Using twice a day.  2 puffs.  Encouraged to continue.  Refilled today.  Questionaires / Pulmonary Flowsheets:   ACT:      No data to display          MMRC: mMRC Dyspnea Scale mMRC Score  02/23/2020  5:13 PM 3    Epworth:      No data to display          Tests:   FENO:  No results found for: NITRICOXIDE  PFT:    Latest Ref Rng & Units 02/18/2020    4:24 PM 01/01/2017    8:40 AM 06/05/2013    9:00 AM  PFT Results  FVC-Pre L 1.29  1.76    FVC-Predicted Pre % 40  54  54      FVC-Post L 1.33  1.88  1.96      FVC-Predicted Post % 42  58  59      Pre FEV1/FVC % % 74  75  82      Post FEV1/FCV % % 75  80  81      FEV1-Pre L 0.95  1.32  1.47      FEV1-Predicted Pre % 38  52  56      FEV1-Post L 1.00  1.50  1.58      DLCO uncorrected ml/min/mmHg 11.41  14.89  13.94      DLCO UNC% % 58  64  60      DLCO corrected ml/min/mmHg 11.41  14.98    DLCO COR %Predicted % 58  65    DLVA Predicted % 108  92  96      TLC L  4.16  4.13      TLC % Predicted %  84  84      RV % Predicted %  118  111         This result is from an external source.  Personally reviewed and interpreted as severe fixed obstruction, DLCO moderately reduced 02/2020 similar findings 12/2016 on my review and interpretation with addition of lung volumes demonstrating normal TLC, borderline but no evidence of air trapping, subsequent worsening FEV1 2021 to compare to 2023.  WALK:     02/12/2023    10:21 AM 04/20/2016    4:38 PM 03/24/2015    5:11 PM 08/26/2014   11:03 AM 04/17/2013    9:21 AM  SIX MIN WALK  Supplimental Oxygen  during Test? (L/min)   No Yes No  O2 Flow Rate    2 L/min   Type    Continuous   2 Minute Oxygen  Saturation % 96 % 93 %     2 Minute HR 138 125     4 Minute Oxygen  Saturation % 96 % 93 %     4 Minute HR 153 132     6 Minute Oxygen  Saturation % 97 % 92 %  6 Minute HR 145 154     Tech Comments:   stopped after 2nd lap due to fatigue, sob Pt had c/o sob after completion of 1st lap. Pt was able to complete 3 laps on 2 L 02.     Imaging: CT Chest W Contrast Result Date: 05/15/2024 CLINICAL DATA:  Non-small cell lung cancer, staging. * Tracking Code: BO * EXAM: CT CHEST WITH CONTRAST TECHNIQUE: Multidetector CT imaging of the chest was performed during intravenous contrast administration. RADIATION DOSE REDUCTION: This exam was performed according to the departmental dose-optimization program which includes automated exposure control, adjustment of the mA and/or kV according to patient size and/or use of iterative reconstruction technique. CONTRAST:  75mL OMNIPAQUE  IOHEXOL  300 MG/ML  SOLN COMPARISON:  Chest CT 11/16/2023, 06/15/2023 and 05/21/2023. FINDINGS: Cardiovascular: No acute vascular findings are demonstrated. Right IJ Port-A-Cath extends into the right atrium. There is atherosclerosis of the aorta, great vessels and coronary arteries. The heart size is normal. There is no pericardial effusion. Mediastinum/Nodes: There are no enlarged mediastinal, hilar or axillary lymph nodes. Interval left axillary node dissection.Stable small mediastinal lymph nodes. Calcified right hilar lymph nodes are noted. The thyroid  gland, trachea and esophagus demonstrate no significant findings. Lungs/Pleura: Stable chronic small left pleural effusion with associated scarring and volume loss posteriorly in the left hemithorax. Underlying moderate centrilobular and paraseptal emphysema  with mild diffuse central airway thickening and cylindrical bronchiectasis. No confluent airspace disease or suspicious pulmonary nodularity. Upper abdomen: The visualized upper abdomen appears stable, without significant findings. There are scattered calcified granulomas in the liver and spleen. Aortic and branch vessel atherosclerosis noted. Musculoskeletal/Chest wall: There is no chest wall mass or suspicious osseous finding. Postsurgical changes in the left breast. Chronic posterior left rib deformities, attributed to prior radiation therapy. IMPRESSION: 1. Interval postsurgical changes in the left breast and left axilla. 2. Otherwise stable chest CT without evidence of local recurrence or metastatic disease. 3. Stable chronic small left pleural effusion with associated scarring and volume loss in the left hemithorax, attributed to prior radiation. 4. Aortic Atherosclerosis (ICD10-I70.0) and Emphysema (ICD10-J43.9). Electronically Signed   By: Elsie Perone M.D.   On: 05/15/2024 13:20     Lab Results: Personally reviewed CBC    Component Value Date/Time   WBC 11.7 (H) 05/12/2024 1541   WBC 13.7 (H) 01/16/2024 1430   RBC 3.73 (L) 05/12/2024 1541   HGB 8.6 (L) 05/12/2024 1541   HCT 27.6 (L) 05/12/2024 1541   PLT 333 05/12/2024 1541   MCV 74.0 (L) 05/12/2024 1541   MCH 23.1 (L) 05/12/2024 1541   MCHC 31.2 05/12/2024 1541   RDW 16.2 (H) 05/12/2024 1541   LYMPHSABS 1.2 05/12/2024 1541   MONOABS 0.8 05/12/2024 1541   EOSABS 0.1 05/12/2024 1541   BASOSABS 0.1 05/12/2024 1541    BMET    Component Value Date/Time   NA 135 05/12/2024 1541   NA 143 11/23/2016 1504   K 3.9 05/12/2024 1541   CL 98 05/12/2024 1541   CO2 30 05/12/2024 1541   GLUCOSE 95 05/12/2024 1541   BUN 12 05/12/2024 1541   BUN 14 11/23/2016 1504   CREATININE 0.52 05/12/2024 1541   CALCIUM  9.3 05/12/2024 1541   GFRNONAA >60 05/12/2024 1541   GFRAA >60 04/02/2020 1400   GFRAA >60 01/26/2020 1019    BNP     Component Value Date/Time   BNP 280.0 (H) 05/26/2020 0505    ProBNP No results found for: PROBNP  Specialty Problems  Pulmonary Problems   Chronic respiratory failure (HCC)   Followed in Pulmonary clinic/ [facility]/ [facility] - 04/17/2013  Walked RA x 1 laps @ 185 ft each stopped due to  desat 86% rapid pace  From resting sat 94%      CRLD (chronic restrictive lung disease) secondary to super morbid obesity with mild COPD component   OSA (obstructive sleep apnea)   Auto CPAP 5-10  2 L oxygen  blended      COPD (chronic obstructive pulmonary disease) (HCC)   Primary malignant neoplasm of bronchus of left lower lobe (HCC)    No Known Allergies  Immunization History  Administered Date(s) Administered  . Influenza Inj Mdck Quad Pf 04/22/2022  . Influenza, Mdck, Trivalent,PF 6+ MOS(egg free) 05/14/2023  . Influenza,inj,Quad PF,6+ Mos 04/09/2013, 07/27/2014, 04/18/2016, 05/28/2017, 04/03/2018, 04/08/2019, 04/22/2021  . PFIZER(Purple Top)SARS-COV-2 Vaccination 09/28/2019, 10/18/2019  . PNEUMOCOCCAL CONJUGATE-20 02/28/2023  . Pfizer Covid-19 Vaccine Bivalent Booster 32yrs & up 04/22/2021  . Pneumococcal Conjugate-13 01/01/2017  . Pneumococcal Polysaccharide-23 07/27/2014  . Respiratory Syncytial Virus Vaccine,Recomb Aduvanted(Arexvy) 06/21/2022  . Tdap 11/12/2017  . Zoster Recombinant(Shingrix) 11/12/2019, 01/14/2020    Past Medical History:  Diagnosis Date  . Acute hypoxemic respiratory failure (HCC)   . Acute on chronic respiratory failure with hypoxia (HCC) 05/24/2020  . Anemia    as teen  . Asthma   . Breast cancer (HCC)    left breast cancer 02/2020  . CHF (congestive heart failure) (HCC)   . CHF, acute on chronic, unknown EF 07/25/2014  . COPD exacerbation (HCC) 12/18/2015  . COPD, mild (HCC)   . Depression   . Diabetes mellitus without complication (HCC)   . Diverticulitis   . Diverticulosis of colon with hemorrhage   . Dyspnea 04/17/2013    Followed in Pulmonary clinic/ [facility]/ [facility]  - hfa 75% p coaching 04/17/2013   - PFT's 06/05/2013 no airflow obst, dlco 60 corrects to 96%    . Family history of anesthesia complication    vomiting  . GI bleeding   . Heart failure (HCC)    New onset 07/25/14  . Histoplasmosis    left eye  . Hyperkalemia   . Hypertension   . Lung cancer (HCC) 05/02/2018   s/p chemoradiation, immunotherapy  . Obesity (BMI 30-39.9)   . Pneumonia    dx wtih pneumonia on 05/27/16- seen by [facility]   . PONV (postoperative nausea and vomiting)   . Restrictive lung disease   . Severe sepsis (HCC)   . Shortness of breath    with exertion   . Sleep apnea    mask and oxygen  at nite for sleep at 2L   . Tobacco abuse   . Umbilical hernia     Tobacco History: Social History   Tobacco Use  Smoking Status Former  . Current packs/day: 0.00  . Average packs/day: 3.0 packs/day for 43.0 years (129.0 ttl pk-yrs)  . Types: Cigarettes  . Start date: 12/17/1969  . Quit date: 12/17/2012  . Years since quitting: 11.4  Smokeless Tobacco Never   Counseling given: Not Answered   Continue to not smoke  Outpatient Encounter Medications as of 06/04/2024  Medication Sig  . acetaminophen  (TYLENOL ) 500 MG tablet Take 500-1,000 mg by mouth every 6 (six) hours as needed for headache (pain).  . albuterol  (VENTOLIN  HFA) 108 (90 Base) MCG/ACT inhaler Inhale 2 puffs into the lungs every 6 (six) hours as needed for wheezing or shortness of breath.  . anastrozole  (ARIMIDEX ) 1  MG tablet TAKE 1 TABLET BY MOUTH EVERY DAY  . aspirin  81 MG chewable tablet Chew 1 tablet (81 mg total) by mouth daily.  . Blood Glucose Monitoring Suppl DEVI 1 each by Does not apply route in the morning, at noon, and at bedtime. May substitute to any manufacturer covered by patient's insurance.  . budeson-glycopyrrolate -formoterol  (BREZTRI  AEROSPHERE) 160-9-4.8 MCG/ACT AERO Inhale 2 puffs into the lungs 2 (two) times daily.  . Cholecalciferol  (VITAMIN D3) 125 MCG (5000 UT) TABS Take 5,000 Units by mouth daily.  . CONTOUR NEXT TEST test strip USE AS DIRECTED IN THE MORNING, AT NOON, AND AT BEDTIME TO CHECK BLOOD GLUCOSE LEVLES  . furosemide  (LASIX ) 20 MG tablet TAKE 1 AND 1/2 TABLETS DAILY BY MOUTH.  SABRA ipratropium-albuterol  (DUONEB) 0.5-2.5 (3) MG/3ML SOLN Take 3 mLs by nebulization every 4 (four) hours as needed.  . montelukast  (SINGULAIR ) 10 MG tablet TAKE 1 TABLET (10 MG TOTAL) BY MOUTH AT BEDTIME. FOR ALLERGIES  . OXYGEN  Inhale 4 L/min into the lungs continuous.  . Polyethyl Glycol-Propyl Glycol (SYSTANE ULTRA) 0.4-0.3 % SOLN Place 1-2 drops into both eyes in the morning and at bedtime.  . potassium chloride  (KLOR-CON  M) 10 MEQ tablet TAKE 1 TABLET BY MOUTH EVERY DAY  . rosuvastatin  (CRESTOR ) 5 MG tablet TAKE 1 TABLET BY MOUTH EVERY DAY FOR CHOLESTEROL  . Semaglutide , 1 MG/DOSE, (OZEMPIC , 1 MG/DOSE,) 4 MG/3ML SOPN INJECT 1 MG INTO THE SKIN ONCE A WEEK. FOR DIABETES.  . sodium chloride  (MURO 128) 5 % ophthalmic ointment Place 1 Application into both eyes at bedtime.  . varenicline  (CHANTIX ) 1 MG tablet TAKE 1 TABLET BY MOUTH TWICE A DAY  . diltiazem  (CARDIZEM  CD) 240 MG 24 hr capsule TAKE 1 CAPSULE BY MOUTH EVERY DAY  . erythromycin ophthalmic ointment Place 1 Application into both eyes at bedtime.  . [DISCONTINUED] prochlorperazine  (COMPAZINE ) 10 MG tablet Take 1 tablet (10 mg total) by mouth every 6 (six) hours as needed (Nausea or vomiting).   No facility-administered encounter medications on file as of 06/04/2024.     Review of Systems  Review of Systems  N/A Physical Exam  BP (!) 100/59   Pulse (!) 117   Temp 98.3 F (36.8 C) (Oral)   Ht 5' 3 (1.6 m)   Wt 121 lb (54.9 kg)   SpO2 97% Comment: 4L TANK  BMI 21.43 kg/m   Wt Readings from Last 5 Encounters:  06/04/24 121 lb (54.9 kg)  05/19/24 117 lb (53.1 kg)  04/04/24 116 lb 3.2 oz (52.7 kg)  03/21/24 129 lb (58.5 kg)  01/23/24 122 lb (55.3 kg)    BMI  Readings from Last 5 Encounters:  06/04/24 21.43 kg/m  05/19/24 20.73 kg/m  04/04/24 20.58 kg/m  03/21/24 22.85 kg/m  01/23/24 21.61 kg/m     Physical Exam General: Chronically ill-appearing, in wheelchair, no acute distress Neck: Habitus precludes JVP evaluation, supple Eyes: EOMI, no icterus Respiratory: Distant breath sounds, more distant on the right, clear otherwise Abdomen: Nondistended, bowel sounds present MSK: No synovitis, no joint effusion Neuro: No weakness, no sensation deficit Psych: Normal mood, full affect   Assessment & Plan:   Severe COPD: PFT 2021 with FEV1 just over 30% after bronchodilator.  No significant bronchodilator response.  Currently well controlled on breztri .   Chronic hypoxic respiratory failure: Most likely related to severe COPD.  Has some cardiovascular issues as well.  To continue 4 L with exertion as prescribed, does okay on 2 L at rest.  No follow-ups on file.   Donnice JONELLE Beals, MD 06/04/2024

## 2024-06-04 NOTE — Patient Instructions (Signed)
 Nice to see you again  New paper day for new medicines try to help with COPD called [facility]   No change in the medication otherwise, I am glad you are doing well  Return to clinic in 6 months or sooner as needed with Dr. Annella

## 2024-06-04 NOTE — Progress Notes (Signed)
 @Patient  ID: Deborah [name], female    DOB: 04/23/1956, 68 y.o.   MRN: [id number]  Chief Complaint  Patient presents with   Shortness of Breath    Patient states her breathing is the same.    Referring provider: Gretta Comer POUR, NP  HPI:  68 y.o. whom we are seeing for evaluation of COPD and chronic hypoxemic respiratory failure.  Most recent PCP note reviewed.  Doing well.  No exacerbation interim.  Good adherence to Breztri .  Has helped her breathing.  She is able to ambulate today into the room as opposed to being wheeled in a wheelchair.  Improvement.  We discussed adding additional medication to see if we can help her breathing.  Questionaires / Pulmonary Flowsheets:   ACT:      No data to display          MMRC: mMRC Dyspnea Scale mMRC Score  02/23/2020  5:13 PM 3    Epworth:      No data to display          Tests:   FENO:  No results found for: NITRICOXIDE  PFT:    Latest Ref Rng & Units 02/18/2020    4:24 PM 01/01/2017    8:40 AM 06/05/2013    9:00 AM  PFT Results  FVC-Pre L 1.29  1.76    FVC-Predicted Pre % 40  54  54      FVC-Post L 1.33  1.88  1.96      FVC-Predicted Post % 42  58  59      Pre FEV1/FVC % % 74  75  82      Post FEV1/FCV % % 75  80  81      FEV1-Pre L 0.95  1.32  1.47      FEV1-Predicted Pre % 38  52  56      FEV1-Post L 1.00  1.50  1.58      DLCO uncorrected ml/min/mmHg 11.41  14.89  13.94      DLCO UNC% % 58  64  60      DLCO corrected ml/min/mmHg 11.41  14.98    DLCO COR %Predicted % 58  65    DLVA Predicted % 108  92  96      TLC L  4.16  4.13      TLC % Predicted %  84  84      RV % Predicted %  118  111         This result is from an external source.  Personally reviewed and interpreted as severe fixed obstruction, DLCO moderately reduced 02/2020 similar findings 12/2016 on my review and interpretation with addition of lung volumes demonstrating normal TLC, borderline but no evidence of air trapping, subsequent  worsening FEV1 2021 to compare to 2023.  WALK:     02/12/2023   10:21 AM 04/20/2016    4:38 PM 03/24/2015    5:11 PM 08/26/2014   11:03 AM 04/17/2013    9:21 AM  SIX MIN WALK  Supplimental Oxygen  during Test? (L/min)   No Yes No  O2 Flow Rate    2 L/min   Type    Continuous   2 Minute Oxygen  Saturation % 96 % 93 %     2 Minute HR 138 125     4 Minute Oxygen  Saturation % 96 % 93 %     4 Minute HR 153 132     6 Minute  Oxygen  Saturation % 97 % 92 %     6 Minute HR 145 154     Tech Comments:   stopped after 2nd lap due to fatigue, sob Pt had c/o sob after completion of 1st lap. Pt was able to complete 3 laps on 2 L 02.     Imaging: CT Chest W Contrast Result Date: 05/15/2024 CLINICAL DATA:  Non-small cell lung cancer, staging. * Tracking Code: BO * EXAM: CT CHEST WITH CONTRAST TECHNIQUE: Multidetector CT imaging of the chest was performed during intravenous contrast administration. RADIATION DOSE REDUCTION: This exam was performed according to the departmental dose-optimization program which includes automated exposure control, adjustment of the mA and/or kV according to patient size and/or use of iterative reconstruction technique. CONTRAST:  75mL OMNIPAQUE  IOHEXOL  300 MG/ML  SOLN COMPARISON:  Chest CT 11/16/2023, 06/15/2023 and 05/21/2023. FINDINGS: Cardiovascular: No acute vascular findings are demonstrated. Right IJ Port-A-Cath extends into the right atrium. There is atherosclerosis of the aorta, great vessels and coronary arteries. The heart size is normal. There is no pericardial effusion. Mediastinum/Nodes: There are no enlarged mediastinal, hilar or axillary lymph nodes. Interval left axillary node dissection.Stable small mediastinal lymph nodes. Calcified right hilar lymph nodes are noted. The thyroid  gland, trachea and esophagus demonstrate no significant findings. Lungs/Pleura: Stable chronic small left pleural effusion with associated scarring and volume loss posteriorly in the left  hemithorax. Underlying moderate centrilobular and paraseptal emphysema with mild diffuse central airway thickening and cylindrical bronchiectasis. No confluent airspace disease or suspicious pulmonary nodularity. Upper abdomen: The visualized upper abdomen appears stable, without significant findings. There are scattered calcified granulomas in the liver and spleen. Aortic and branch vessel atherosclerosis noted. Musculoskeletal/Chest wall: There is no chest wall mass or suspicious osseous finding. Postsurgical changes in the left breast. Chronic posterior left rib deformities, attributed to prior radiation therapy. IMPRESSION: 1. Interval postsurgical changes in the left breast and left axilla. 2. Otherwise stable chest CT without evidence of local recurrence or metastatic disease. 3. Stable chronic small left pleural effusion with associated scarring and volume loss in the left hemithorax, attributed to prior radiation. 4. Aortic Atherosclerosis (ICD10-I70.0) and Emphysema (ICD10-J43.9). Electronically Signed   By: Elsie Perone M.D.   On: 05/15/2024 13:20     Lab Results: Personally reviewed CBC    Component Value Date/Time   WBC 11.7 (H) 05/12/2024 1541   WBC 13.7 (H) 01/16/2024 1430   RBC 3.73 (L) 05/12/2024 1541   HGB 8.6 (L) 05/12/2024 1541   HCT 27.6 (L) 05/12/2024 1541   PLT 333 05/12/2024 1541   MCV 74.0 (L) 05/12/2024 1541   MCH 23.1 (L) 05/12/2024 1541   MCHC 31.2 05/12/2024 1541   RDW 16.2 (H) 05/12/2024 1541   LYMPHSABS 1.2 05/12/2024 1541   MONOABS 0.8 05/12/2024 1541   EOSABS 0.1 05/12/2024 1541   BASOSABS 0.1 05/12/2024 1541    BMET    Component Value Date/Time   NA 135 05/12/2024 1541   NA 143 11/23/2016 1504   K 3.9 05/12/2024 1541   CL 98 05/12/2024 1541   CO2 30 05/12/2024 1541   GLUCOSE 95 05/12/2024 1541   BUN 12 05/12/2024 1541   BUN 14 11/23/2016 1504   CREATININE 0.52 05/12/2024 1541   CALCIUM  9.3 05/12/2024 1541   GFRNONAA >60 05/12/2024 1541    GFRAA >60 04/02/2020 1400   GFRAA >60 01/26/2020 1019    BNP    Component Value Date/Time   BNP 280.0 (H) 05/26/2020 0505  ProBNP No results found for: PROBNP  Specialty Problems       Pulmonary Problems   Chronic respiratory failure (HCC)   Followed in Pulmonary clinic/ [facility]/ [facility] - 04/17/2013  Walked RA x 1 laps @ 185 ft each stopped due to  desat 86% rapid pace  From resting sat 94%      CRLD (chronic restrictive lung disease) secondary to super morbid obesity with mild COPD component   OSA (obstructive sleep apnea)   Auto CPAP 5-10  2 L oxygen  blended      COPD (chronic obstructive pulmonary disease) (HCC)   Primary malignant neoplasm of bronchus of left lower lobe (HCC)    No Known Allergies  Immunization History  Administered Date(s) Administered   Influenza Inj Mdck Quad Pf 04/22/2022   Influenza, Mdck, Trivalent,PF 6+ MOS(egg free) 05/14/2023   Influenza,inj,Quad PF,6+ Mos 04/09/2013, 07/27/2014, 04/18/2016, 05/28/2017, 04/03/2018, 04/08/2019, 04/22/2021   PFIZER(Purple Top)SARS-COV-2 Vaccination 09/28/2019, 10/18/2019   PNEUMOCOCCAL CONJUGATE-20 02/28/2023   Pfizer Covid-19 Vaccine Bivalent Booster 59yrs & up 04/22/2021   Pneumococcal Conjugate-13 01/01/2017   Pneumococcal Polysaccharide-23 07/27/2014   Respiratory Syncytial Virus Vaccine,Recomb Aduvanted(Arexvy) 06/21/2022   Tdap 11/12/2017   Zoster Recombinant(Shingrix) 11/12/2019, 01/14/2020    Past Medical History:  Diagnosis Date   Acute hypoxemic respiratory failure (HCC)    Acute on chronic respiratory failure with hypoxia (HCC) 05/24/2020   Anemia    as teen   Asthma    Breast cancer (HCC)    left breast cancer 02/2020   CHF (congestive heart failure) (HCC)    CHF, acute on chronic, unknown EF 07/25/2014   COPD exacerbation (HCC) 12/18/2015   COPD, mild (HCC)    Depression    Diabetes mellitus without complication (HCC)    Diverticulitis    Diverticulosis of colon with  hemorrhage    Dyspnea 04/17/2013   Followed in Pulmonary clinic/ [facility]/ [facility]  - hfa 75% p coaching 04/17/2013   - PFT's 06/05/2013 no airflow obst, dlco 60 corrects to 96%     Family history of anesthesia complication    vomiting   GI bleeding    Heart failure (HCC)    New onset 07/25/14   Histoplasmosis    left eye   Hyperkalemia    Hypertension    Lung cancer (HCC) 05/02/2018   s/p chemoradiation, immunotherapy   Obesity (BMI 30-39.9)    Pneumonia    dx wtih pneumonia on 05/27/16- seen by [facility]    PONV (postoperative nausea and vomiting)    Restrictive lung disease    Severe sepsis (HCC)    Shortness of breath    with exertion    Sleep apnea    mask and oxygen  at nite for sleep at 2L    Tobacco abuse    Umbilical hernia     Tobacco History: Social History   Tobacco Use  Smoking Status Former   Current packs/day: 0.00   Average packs/day: 3.0 packs/day for 43.0 years (129.0 ttl pk-yrs)   Types: Cigarettes   Start date: 12/17/1969   Quit date: 12/17/2012   Years since quitting: 11.4  Smokeless Tobacco Never   Counseling given: Not Answered   Continue to not smoke  Outpatient Encounter Medications as of 06/04/2024  Medication Sig   acetaminophen  (TYLENOL ) 500 MG tablet Take 500-1,000 mg by mouth every 6 (six) hours as needed for headache (pain).   albuterol  (VENTOLIN  HFA) 108 (90 Base) MCG/ACT inhaler Inhale 2 puffs into the lungs every 6 (  six) hours as needed for wheezing or shortness of breath.   anastrozole  (ARIMIDEX ) 1 MG tablet TAKE 1 TABLET BY MOUTH EVERY DAY   aspirin  81 MG chewable tablet Chew 1 tablet (81 mg total) by mouth daily.   Blood Glucose Monitoring Suppl DEVI 1 each by Does not apply route in the morning, at noon, and at bedtime. May substitute to any manufacturer covered by patient's insurance.   budeson-glycopyrrolate -formoterol  (BREZTRI  AEROSPHERE) 160-9-4.8 MCG/ACT AERO Inhale 2 puffs into the lungs 2 (two) times daily.    Cholecalciferol (VITAMIN D3) 125 MCG (5000 UT) TABS Take 5,000 Units by mouth daily.   CONTOUR NEXT TEST test strip USE AS DIRECTED IN THE MORNING, AT NOON, AND AT BEDTIME TO CHECK BLOOD GLUCOSE LEVLES   furosemide  (LASIX ) 20 MG tablet TAKE 1 AND 1/2 TABLETS DAILY BY MOUTH.   ipratropium-albuterol  (DUONEB) 0.5-2.5 (3) MG/3ML SOLN Take 3 mLs by nebulization every 4 (four) hours as needed.   montelukast  (SINGULAIR ) 10 MG tablet TAKE 1 TABLET (10 MG TOTAL) BY MOUTH AT BEDTIME. FOR ALLERGIES   OXYGEN  Inhale 4 L/min into the lungs continuous.   Polyethyl Glycol-Propyl Glycol (SYSTANE ULTRA) 0.4-0.3 % SOLN Place 1-2 drops into both eyes in the morning and at bedtime.   potassium chloride  (KLOR-CON  M) 10 MEQ tablet TAKE 1 TABLET BY MOUTH EVERY DAY   rosuvastatin  (CRESTOR ) 5 MG tablet TAKE 1 TABLET BY MOUTH EVERY DAY FOR CHOLESTEROL   Semaglutide , 1 MG/DOSE, (OZEMPIC , 1 MG/DOSE,) 4 MG/3ML SOPN INJECT 1 MG INTO THE SKIN ONCE A WEEK. FOR DIABETES.   sodium chloride  (MURO 128) 5 % ophthalmic ointment Place 1 Application into both eyes at bedtime.   varenicline  (CHANTIX ) 1 MG tablet TAKE 1 TABLET BY MOUTH TWICE A DAY   diltiazem  (CARDIZEM  CD) 240 MG 24 hr capsule TAKE 1 CAPSULE BY MOUTH EVERY DAY   erythromycin ophthalmic ointment Place 1 Application into both eyes at bedtime.   [DISCONTINUED] prochlorperazine  (COMPAZINE ) 10 MG tablet Take 1 tablet (10 mg total) by mouth every 6 (six) hours as needed (Nausea or vomiting).   No facility-administered encounter medications on file as of 06/04/2024.     Review of Systems  Review of Systems  N/A Physical Exam  BP (!) 100/59   Pulse (!) 117   Temp 98.3 F (36.8 C) (Oral)   Ht 5' 3 (1.6 m)   Wt 121 lb (54.9 kg)   SpO2 97% Comment: 4L TANK  BMI 21.43 kg/m   Wt Readings from Last 5 Encounters:  06/04/24 121 lb (54.9 kg)  05/19/24 117 lb (53.1 kg)  04/04/24 116 lb 3.2 oz (52.7 kg)  03/21/24 129 lb (58.5 kg)  01/23/24 122 lb (55.3 kg)    BMI  Readings from Last 5 Encounters:  06/04/24 21.43 kg/m  05/19/24 20.73 kg/m  04/04/24 20.58 kg/m  03/21/24 22.85 kg/m  01/23/24 21.61 kg/m     Physical Exam General: Chronically ill-appearing, sitting up in chair Neck: No JVP Eyes: No icterus Respiratory: Distant, left-sided wheeze resolved with request for patient to cough, otherwise clear bilaterally Abdomen: Nondistended  Assessment & Plan:   Severe COPD: PFT 2021 with FEV1 just over 30% after bronchodilator.  No significant bronchodilator response.  Currently well controlled on breztri  without further exacerbations since the change.  And improvement in artery status now able to ambulate farther.  New paperwork today, new prescription Ohtuvayre , for an attempt at additional improvement in breathing.  Chronic hypoxic respiratory failure: Stable, continue 4 L with exertion,  2 L at rest.   Return in about 6 months (around 12/02/2024) for f/u Dr. Annella.   Donnice JONELLE Annella, MD 06/04/2024

## 2024-06-09 ENCOUNTER — Telehealth: Payer: Self-pay

## 2024-06-09 NOTE — Telephone Encounter (Signed)
 Received referral for new start Ohtuvayre . Opening benefits investigation in this thread.  If pharmacy team did not receive paperwork, will need to route to clinical team for assistance with obtaining enrollment form.

## 2024-06-10 NOTE — Telephone Encounter (Signed)
 Received Ohtuvayre  new start paperwork. Completed form and faxed with clinicals and insurance card copy to [facility]   Phone#: [phone] Fax#: [phone]

## 2024-06-11 NOTE — Telephone Encounter (Signed)
 Received fax that enrollment form was received.   ID [id number]

## 2024-06-16 NOTE — Telephone Encounter (Signed)
 Received fax from [employer] with summary of benefits. Referral form for Ohtuvayre  received. Rx will be triaged to [facility] (phone: [phone]). Once benefits investigation completed, pharmacy will reach out the patient to schedule shipment. If medication is unaffordable, patient will need to express financial hardship to be referred back to [facility] for patient assistance program pre-screening.   Patient ID: [id number] Pharmacy phone: [facility] (phone: [phone]) [facility] Phone#: [phone]

## 2024-06-16 NOTE — Telephone Encounter (Signed)
 Received fax from [facility] confirming receipt of rx.

## 2024-06-18 ENCOUNTER — Other Ambulatory Visit (HOSPITAL_COMMUNITY): Payer: Self-pay

## 2024-06-19 ENCOUNTER — Other Ambulatory Visit (HOSPITAL_COMMUNITY): Payer: Self-pay

## 2024-06-22 ENCOUNTER — Other Ambulatory Visit: Payer: Self-pay | Admitting: Primary Care

## 2024-06-22 DIAGNOSIS — Z720 Tobacco use: Secondary | ICD-10-CM

## 2024-07-03 ENCOUNTER — Inpatient Hospital Stay: Attending: Internal Medicine

## 2024-07-06 ENCOUNTER — Other Ambulatory Visit: Payer: Self-pay | Admitting: Cardiology

## 2024-07-06 DIAGNOSIS — I272 Pulmonary hypertension, unspecified: Secondary | ICD-10-CM

## 2024-07-06 DIAGNOSIS — I5032 Chronic diastolic (congestive) heart failure: Secondary | ICD-10-CM

## 2024-07-07 ENCOUNTER — Inpatient Hospital Stay

## 2024-07-18 ENCOUNTER — Other Ambulatory Visit: Payer: Self-pay | Admitting: Cardiovascular Disease

## 2024-07-18 ENCOUNTER — Other Ambulatory Visit: Payer: Self-pay | Admitting: Hematology and Oncology

## 2024-07-19 ENCOUNTER — Other Ambulatory Visit: Payer: Self-pay | Admitting: Primary Care

## 2024-07-19 DIAGNOSIS — Z720 Tobacco use: Secondary | ICD-10-CM

## 2024-07-20 NOTE — Telephone Encounter (Signed)
 Please call patient:  Is she still smoking? Taking chantix ?   Just wanted to check in as I received a refill request.

## 2024-07-21 NOTE — Telephone Encounter (Signed)
 Patient is taking. She is still smoking between 1-6 cigarettes a day.

## 2024-07-21 NOTE — Telephone Encounter (Signed)
 Refills sent to pharmacy.

## 2024-08-20 ENCOUNTER — Other Ambulatory Visit: Payer: Self-pay | Admitting: Primary Care

## 2024-08-20 DIAGNOSIS — Z720 Tobacco use: Secondary | ICD-10-CM

## 2024-08-21 NOTE — Telephone Encounter (Signed)
 Received fax from [facility] stating they are out of network and unable to fill pt's Ohtuvayre . Rx has been transferred to [facility].  Pharmacy phone #: [phone]

## 2024-08-28 ENCOUNTER — Inpatient Hospital Stay

## 2024-09-01 ENCOUNTER — Ambulatory Visit: Admitting: Hematology and Oncology

## 2024-09-01 ENCOUNTER — Other Ambulatory Visit

## 2024-09-24 ENCOUNTER — Ambulatory Visit: Admitting: Primary Care

## 2024-11-10 ENCOUNTER — Inpatient Hospital Stay

## 2024-11-18 ENCOUNTER — Inpatient Hospital Stay: Admitting: Internal Medicine
